# Patient Record
Sex: Male | Born: 1975 | Race: Black or African American | Hispanic: No | Marital: Single | Smoking: Never smoker
Health system: Southern US, Community
[De-identification: ages and names within clinical notes are randomized; demographics above are authoritative.]

## PROBLEM LIST (undated history)

## (undated) DIAGNOSIS — N189 Chronic kidney disease, unspecified: Secondary | ICD-10-CM

## (undated) DIAGNOSIS — M86172 Other acute osteomyelitis, left ankle and foot: Secondary | ICD-10-CM

## (undated) DIAGNOSIS — L089 Local infection of the skin and subcutaneous tissue, unspecified: Secondary | ICD-10-CM

## (undated) DIAGNOSIS — K219 Gastro-esophageal reflux disease without esophagitis: Secondary | ICD-10-CM

## (undated) DIAGNOSIS — E6609 Other obesity due to excess calories: Secondary | ICD-10-CM

## (undated) DIAGNOSIS — I509 Heart failure, unspecified: Secondary | ICD-10-CM

## (undated) DIAGNOSIS — I10 Essential (primary) hypertension: Secondary | ICD-10-CM

## (undated) HISTORY — DX: Other obesity due to excess calories: E66.09

## (undated) HISTORY — DX: Other acute osteomyelitis, left ankle and foot: M86.172

## (undated) HISTORY — DX: Local infection of the skin and subcutaneous tissue, unspecified: L08.9

## (undated) SURGERY — LEFT AND RIGHT HEART CATHETERIZATION WITH CORONARY ANGIOGRAM
Anesthesia: Moderate Sedation | Laterality: Right

---
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
## 1999-09-16 ENCOUNTER — Emergency Department (HOSPITAL_COMMUNITY): Admission: EM | Admit: 1999-09-16 | Discharge: 1999-09-16 | Payer: Self-pay | Admitting: Emergency Medicine

## 2000-09-12 ENCOUNTER — Encounter: Payer: Self-pay | Admitting: Emergency Medicine

## 2000-09-12 ENCOUNTER — Emergency Department (HOSPITAL_COMMUNITY): Admission: EM | Admit: 2000-09-12 | Discharge: 2000-09-12 | Payer: Self-pay | Admitting: Emergency Medicine

## 2000-10-12 ENCOUNTER — Emergency Department (HOSPITAL_COMMUNITY): Admission: EM | Admit: 2000-10-12 | Discharge: 2000-10-12 | Payer: Self-pay | Admitting: Emergency Medicine

## 2001-06-20 HISTORY — PX: ANKLE SURGERY: SHX546

## 2003-01-02 ENCOUNTER — Emergency Department (HOSPITAL_COMMUNITY): Admission: EM | Admit: 2003-01-02 | Discharge: 2003-01-02 | Payer: Self-pay | Admitting: Emergency Medicine

## 2003-09-28 ENCOUNTER — Emergency Department (HOSPITAL_COMMUNITY): Admission: EM | Admit: 2003-09-28 | Discharge: 2003-09-28 | Payer: Self-pay

## 2003-10-01 ENCOUNTER — Ambulatory Visit (HOSPITAL_BASED_OUTPATIENT_CLINIC_OR_DEPARTMENT_OTHER): Admission: RE | Admit: 2003-10-01 | Discharge: 2003-10-01 | Payer: Self-pay | Admitting: Orthopedic Surgery

## 2003-10-01 ENCOUNTER — Ambulatory Visit: Admission: RE | Admit: 2003-10-01 | Discharge: 2003-10-01 | Payer: Self-pay | Admitting: Orthopedic Surgery

## 2004-08-21 ENCOUNTER — Emergency Department (HOSPITAL_COMMUNITY): Admission: EM | Admit: 2004-08-21 | Discharge: 2004-08-21 | Payer: Self-pay | Admitting: Emergency Medicine

## 2006-11-17 ENCOUNTER — Emergency Department (HOSPITAL_COMMUNITY): Admission: EM | Admit: 2006-11-17 | Discharge: 2006-11-17 | Payer: Self-pay | Admitting: Family Medicine

## 2008-11-10 ENCOUNTER — Observation Stay (HOSPITAL_COMMUNITY): Admission: EM | Admit: 2008-11-10 | Discharge: 2008-11-11 | Payer: Self-pay | Admitting: Cardiovascular Disease

## 2008-11-10 ENCOUNTER — Ambulatory Visit: Payer: Self-pay | Admitting: Diagnostic Radiology

## 2008-11-10 ENCOUNTER — Encounter: Payer: Self-pay | Admitting: Emergency Medicine

## 2008-11-10 ENCOUNTER — Encounter (INDEPENDENT_AMBULATORY_CARE_PROVIDER_SITE_OTHER): Payer: Self-pay | Admitting: Cardiovascular Disease

## 2009-01-15 ENCOUNTER — Encounter: Payer: Self-pay | Admitting: Endocrinology

## 2009-01-26 ENCOUNTER — Ambulatory Visit: Payer: Self-pay | Admitting: Endocrinology

## 2009-01-26 DIAGNOSIS — I110 Hypertensive heart disease with heart failure: Secondary | ICD-10-CM | POA: Insufficient documentation

## 2009-09-02 ENCOUNTER — Ambulatory Visit: Payer: Self-pay | Admitting: Diagnostic Radiology

## 2009-09-02 ENCOUNTER — Inpatient Hospital Stay (HOSPITAL_COMMUNITY): Admission: AD | Admit: 2009-09-02 | Discharge: 2009-09-04 | Payer: Self-pay | Admitting: Internal Medicine

## 2009-09-02 ENCOUNTER — Encounter: Payer: Self-pay | Admitting: Emergency Medicine

## 2009-09-07 ENCOUNTER — Telehealth: Payer: Self-pay | Admitting: Endocrinology

## 2009-09-17 ENCOUNTER — Ambulatory Visit: Payer: Self-pay | Admitting: Endocrinology

## 2009-09-17 DIAGNOSIS — N498 Inflammatory disorders of other specified male genital organs: Secondary | ICD-10-CM | POA: Insufficient documentation

## 2009-09-25 ENCOUNTER — Ambulatory Visit: Payer: Self-pay | Admitting: Endocrinology

## 2009-09-25 DIAGNOSIS — B356 Tinea cruris: Secondary | ICD-10-CM

## 2009-09-25 DIAGNOSIS — J309 Allergic rhinitis, unspecified: Secondary | ICD-10-CM | POA: Insufficient documentation

## 2009-10-27 ENCOUNTER — Encounter (INDEPENDENT_AMBULATORY_CARE_PROVIDER_SITE_OTHER): Payer: Self-pay | Admitting: *Deleted

## 2009-10-27 ENCOUNTER — Encounter: Payer: Self-pay | Admitting: Endocrinology

## 2009-10-27 LAB — CONVERTED CEMR LAB
ALT: 18 units/L
AST: 15 units/L
BUN: 20 mg/dL
Basophils Relative: 0 %
CO2: 25 meq/L
Calcium: 9.3 mg/dL
Chloride: 101 meq/L
Creatinine, Ser: 1.1 mg/dL
Eosinophils Relative: 0 %
GFR calc non Af Amer: 83 mL/min
Glucose, Bld: 351 mg/dL
HCT: 45.2 %
Hemoglobin: 14.8 g/dL
Lymphocytes, automated: 1.5 %
MCV: 88 fL
Monocytes Relative: 0.7 %
Neutrophils Relative %: 8.5 %
Platelets: 287 10*3/uL
Potassium: 5.1 meq/L
RBC: 5.12 M/uL
RDW: 13.7 %
Sodium: 135 meq/L
Total Bilirubin: 0.7 mg/dL
WBC: 10.7 10*3/uL

## 2009-11-03 ENCOUNTER — Encounter (INDEPENDENT_AMBULATORY_CARE_PROVIDER_SITE_OTHER): Payer: Self-pay | Admitting: *Deleted

## 2009-11-05 ENCOUNTER — Telehealth (INDEPENDENT_AMBULATORY_CARE_PROVIDER_SITE_OTHER): Payer: Self-pay | Admitting: *Deleted

## 2010-05-20 ENCOUNTER — Encounter: Payer: Self-pay | Admitting: Endocrinology

## 2010-07-20 NOTE — Assessment & Plan Note (Signed)
Summary: 1 WK ROV/NWS   Vital Signs:  Patient profile:   35 year old male Height:      71 inches (180.34 cm) Weight:      341.38 pounds (155.17 kg) O2 Sat:      98 % on Room air Temp:     98.1 degrees F (36.72 degrees C) oral Pulse rate:   118 / minute BP sitting:   140 / 106  (left arm) Cuff size:   large  Vitals Entered By: Gardenia Phlegm RMA (September 25, 2009 4:24 PM)  O2 Flow:  Room air CC: 1 week follow up/ CF Is Patient Diabetic? Yes   Referring Provider:  Dr Robyn Haber  CC:  1 week follow up/ CF.  History of Present Illness: pt says his scrotal abscess is much better. however, he has few weeks of slight itching of the scrotum, with slight associated rash. no cbg record, but states cbg's are improved to low-100's. pt also says he has rhinorrhea and nasal congestion.  Current Medications (verified): 1)  Accu-Chek Compact  Strp (Glucose Blood) .... Two Times A Day, and Lancets 250.00 2)  Actos 45 Mg Tabs (Pioglitazone Hcl) .Marland Kitchen.. 1 Qd 3)  Januvia 100 Mg Tabs (Sitagliptin Phosphate) .Marland Kitchen.. 1 Qam 4)  Metformin Hcl 500 Mg Xr24h-Tab (Metformin Hcl) .Marland Kitchen.. 1 Tab By Mouth Two Times A Day 5)  Carvedilol 3.125 Mg Tabs (Carvedilol) .Marland Kitchen.. 1 Tab By Mouth Two Times A Day 6)  Furosemide 40 Mg Tabs (Furosemide) .Marland Kitchen.. 1 Tab By Mouth Daily  Allergies (verified): No Known Drug Allergies  Past History:  Past Medical History: SCROTAL ABSCESS (ICD-608.4) HYPERTENSION (ICD-401.9) DM (ICD-250.00)  Review of Systems  The patient denies fever and dyspnea on exertion.         denies hypoglycemia  Physical Exam  General:  morbidly obese.  no distress  Head:  head: no deformity eyes: no periorbital swelling, no proptosis external nose and ears are normal mouth: no lesion seen Ears:  TM's intact and clear with normal canals with grossly normal hearing.   Genitalia:  the incision is 90% closed.  no drainage there is an excematous, slightly red rash on the scrotum   Impression &  Recommendations:  Problem # 1:  SCROTAL ABSCESS (ICD-608.4) Assessment Improved  Problem # 2:  HYPERTENSION (ICD-401.9) needs increased rx  Problem # 3:  DM (ICD-250.00) Assessment: Improved  Problem # 4:  TINEA CRURIS (ICD-110.3) Assessment: New  Problem # 5:  ALLERGIC RHINITIS CAUSE UNSPECIFIED (ICD-477.9) Assessment: New  Medications Added to Medication List This Visit: 1)  Clotrimazole-betamethasone 1-0.05 % Crea (Clotrimazole-betamethasone) .... Three times a day as needed for itching 2)  Carvedilol 12.5 Mg Tabs (Carvedilol) .Marland Kitchen.. 1 two times a day  Other Orders: Est. Patient Level IV [id number])  Patient Instructions: 1)  check your blood sugar 1 time a day.  vary the time of day when you check, between before the 3 meals, and at bedtime.  also check if you have symptoms of your blood sugar being too high or too low.  please keep a record of the readings and bring it to your next appointment here.  please call us sooner if you are having low blood sugar episodes. 2)  same medications for diabetes 3)  we will need to take this complex situation in stages. 4)  for now, increase the carvedilol to 12.5 mg two times a day. 5)  lotrisone cream three times a day as needed for itching. 6)  loratadine-d (non-prescription) as needed for congestion 7)  here are some samples of nasonex.  take 2 sprays each side two times a day.  if you want a prescription, i would advise a similar generic. 8)  Please schedule a follow-up appointment in 3 weeks. Prescriptions: FUROSEMIDE 40 MG TABS (FUROSEMIDE) 1 tab by mouth daily  #30 x 11   Entered and Authorized by:   Donavan Foil MD   Signed by:   Donavan Foil MD on 09/25/2009   Method used:   Electronically to        [facility]. [address] [phone]* (retail)       [address] [place], [place]  [address]       Ph: [phone] or [phone]       Fax: [phone]   RxID:   [id number] CARVEDILOL 12.5 MG TABS  (CARVEDILOL) 1 two times a day  #60 x 11   Entered and Authorized by:   Donavan Foil MD   Signed by:   Donavan Foil MD on 09/25/2009   Method used:   Electronically to        [facility]. [address] [phone]* (retail)       [address] [place], [place]  [address]       Ph: [phone] or [phone]       Fax: [phone]   RxID:   [id number] CLOTRIMAZOLE-BETAMETHASONE 1-0.05 % CREA (CLOTRIMAZOLE-BETAMETHASONE) three times a day as needed for itching  #1 med tube x 2   Entered and Authorized by:   Donavan Foil MD   Signed by:   Donavan Foil MD on 09/25/2009   Method used:   Electronically to        [facility]. [address] [phone]* (retail)       [address]       [place], [place]  [address]       Ph: [phone] or [phone]       Fax: [phone]   RxID:   [id number]

## 2010-07-20 NOTE — Progress Notes (Signed)
Summary: Glucometer  Phone Note From Other Clinic   Caller: Diane--Adv. [facility] [id number] Call For: [phone] Summary of Call: Diane called requesting glucometer. Per Diane, he does not have a glucometer in the home. Please advise. Initial call taken by: Ernestene Mention,  September 07, 2009 10:48 AM  Follow-up for Phone Call        options: i can send rx to pharmacy f/u ov is due, when i can give you one. Follow-up by: Donavan Foil MD,  September 07, 2009 12:08 PM  Additional Follow-up for Phone Call Additional follow up Details #1::        left message on machine for patient to call back to office. Additional Follow-up by: Ernestene Mention,  September 07, 2009 12:35 PM    Additional Follow-up for Phone Call Additional follow up Details #2::    pt has f/u appt sch and will get glucometer at [facility] Follow-up by: Crissie Sickles, [place],  September 08, 2009 2:16 PM

## 2010-07-20 NOTE — Letter (Signed)
Summary: [facility]   Imported By: Bubba Hales 11/04/2009 08:53:33  _____________________________________________________________________  External Attachment:    Type:   Image     Comment:   External Document

## 2010-07-20 NOTE — Assessment & Plan Note (Signed)
Summary: PER PTD/T--DIABETES FU--HAD BOIL/LEG SURGERY:09/04/09---STC   Vital Signs:  Patient profile:   35 year old male Height:      71 inches (180.34 cm) Weight:      338.25 pounds (153.75 kg) O2 Sat:      94 % on Room air Temp:     99.0 degrees F (37.22 degrees C) oral Pulse rate:   135 / minute BP sitting:   152 / 120  (left arm) Cuff size:   large  Vitals Entered By: Gardenia Phlegm RMA (September 17, 2009 10:24 AM)  O2 Flow:  Room air CC: Diabetic follow up/ pt states he had surgery on his leg, hips, and on a boil on his groin/ CF Is Patient Diabetic? Yes   Referring Provider:  Dr Robyn Haber  CC:  Diabetic follow up/ pt states he had surgery on his leg, hips, and and on a boil on his groin/ CF.  History of Present Illness: pt is long overdue for f/u.  he was seen in er for few days of moderate abscess at the left inguinal area, but no associated fever.   he was rx'ed insulin in the er.  he did not take oral agents as rx'ed, and does not check cbg's.  Current Medications (verified): 1)  Accu-Chek Compact  Strp (Glucose Blood) .... Two Times A Day, and Lancets 250.00 2)  Actos 45 Mg Tabs (Pioglitazone Hcl) .Marland Kitchen.. 1 Qd 3)  Januvia 100 Mg Tabs (Sitagliptin Phosphate) .Marland Kitchen.. 1 Qam 4)  Metformin Hcl 500 Mg Xr24h-Tab (Metformin Hcl) .Marland Kitchen.. 1 Tab By Mouth Two Times A Day 5)  Carvedilol 3.125 Mg Tabs (Carvedilol) .Marland Kitchen.. 1 Tab By Mouth Two Times A Day 6)  Doxycycline Hyclate 100mg  .... 1 Tab By Mouth Two Times A Day 7)  Benazepril 5mg  .... 1 Tab By Mouth Two Times A Day 8)  Furosemide 40 Mg Tabs (Furosemide) .Marland Kitchen.. 1 Tab By Mouth Daily  Allergies (verified): No Known Drug Allergies  Past History:  Past Medical History: HYPERTENSION (ICD-401.9) DM (ICD-250.00)  Review of Systems  The patient denies hypoglycemia and fever.    Physical Exam  General:  morbidly obese.   Genitalia:  there is an open but clean incision at the left scrotum, approx 10 cm long.  no  erythema   Impression & Recommendations:  Problem # 1:  DM (ICD-250.00) therapy limited by noncompliance.  i'll do the best i can.  Problem # 2:  SCROTAL ABSCESS (ICD-608.4) Assessment: Improved  Problem # 3:  HYPERTENSION (ICD-401.9) therapy limited by noncompliance.  i'll do the best i can.  Medications Added to Medication List This Visit: 1)  Metformin Hcl 500 Mg Xr24h-tab (Metformin hcl) .Marland Kitchen.. 1 tab by mouth two times a day 2)  Carvedilol 3.125 Mg Tabs (Carvedilol) .Marland Kitchen.. 1 tab by mouth two times a day 3)  Doxycycline Hyclate 100mg   .... 1 tab by mouth two times a day 4)  Benazepril 5mg   .... 1 tab by mouth two times a day 5)  Furosemide 40 Mg Tabs (Furosemide) .Marland Kitchen.. 1 tab by mouth daily  Other Orders: Est. Patient Level IV [id number])  Patient Instructions: 1)  check your blood sugar 1 time a day.  vary the time of day when you check, between before the 3 meals, and at bedtime.  also check if you have symptoms of your blood sugar being too high or too low.  please keep a record of the readings and bring it to your next appointment here.  please call us sooner if you are having low blood sugar episodes. 2)  here are 2 identical blood sugar meters. 3)  resume these pills: 4)  actos 45 mg once daily 5)  metformin-xr 2000 mg each am 6)  januvia 100 mg each am 7)  (these will replace your insulin.  i have sent presctriptions to your pharmacy) 8)  return 1 week 9)  for now, resume just the carvedilol and furosemide as listed below 10)  you can stop the antiboitic 11)  wash the area gently with soap and water two times a day, and dry with a hair dryer. 12)  keep a dry paper towel against the area. Prescriptions: METFORMIN HCL 500 MG XR24H-TAB (METFORMIN HCL) 4 tabs qam  #120 x 11   Entered and Authorized by:   Donavan Foil MD   Signed by:   Donavan Foil MD on 09/17/2009   Method used:   Electronically to        [facility]. [address] [phone]* (retail)       [address] [place], [place]  [address]       Ph: [phone] or [phone]       Fax: [phone]   RxID:   [id number] JANUVIA 100 MG TABS (SITAGLIPTIN PHOSPHATE) 1 qam  #30 x 11   Entered and Authorized by:   Donavan Foil MD   Signed by:   Donavan Foil MD on 09/17/2009   Method used:   Electronically to        [facility]. [address] [phone]* (retail)       [address] [place], [place]  [address]       Ph: [phone] or [phone]       Fax: [phone]   RxID:   [id number] ACTOS 45 MG TABS (PIOGLITAZONE HCL) 1 qd  #30 x 11   Entered and Authorized by:   Donavan Foil MD   Signed by:   Donavan Foil MD on 09/17/2009   Method used:   Electronically to        [facility]. [address] [phone]* (retail)       [address] [place], [place]  [address]       Ph: [phone] or [phone]       Fax: [phone]   RxID:   [id number] ACCU-CHEK COMPACT  STRP (GLUCOSE BLOOD) two times a day, and lancets 250.00  #100 x 11   Entered and Authorized by:   Donavan Foil MD   Signed by:   Donavan Foil MD on 09/17/2009   Method used:   Electronically to        [facility]. [address] [phone]* (retail)       [address]       [place], [place]  [address]       Ph: [phone] or [phone]       Fax: [phone]   RxID:   [id number]

## 2010-07-20 NOTE — Progress Notes (Signed)
  Phone Note Outgoing Call   Summary of Call: Left message for pt to return my call. Initial call taken by: Gardenia Phlegm RMA,  Nov 05, 2009 4:51 PM

## 2010-07-20 NOTE — Miscellaneous (Signed)
Summary: Lab results  Clinical Lists Changes  Observations: Added new observation of BILI TOTAL: 0.7 mg/dL (10/27/2009 15:03) Added new observation of SGPT (ALT): 18 units/L (10/27/2009 15:03) Added new observation of SGOT (AST): 15 units/L (10/27/2009 15:03) Added new observation of GFR: 83 mL/min (10/27/2009 15:02) Added new observation of CALCIUM: 9.3 mg/dL (10/27/2009 15:02) Added new observation of CO2 PLSM/SER: 25 meq/L (10/27/2009 15:02) Added new observation of CL SERUM: 101 meq/L (10/27/2009 15:02) Added new observation of K SERUM: 5.1 meq/L (10/27/2009 15:02) Added new observation of NA: 135 meq/L (10/27/2009 15:02) Added new observation of CREATININE: 1.1 mg/dL (10/27/2009 15:02) Added new observation of BUN: 20 mg/dL (10/27/2009 15:02) Added new observation of BG RANDOM: 351 mg/dL (10/27/2009 15:02) Added new observation of BASOPHIL %: 0.0 % (10/27/2009 15:01) Added new observation of % EOS AUTO: 0.00 % (10/27/2009 15:01) Added new observation of MONOCYTE %: 0.7 % (10/27/2009 15:01) Added new observation of LYMPH %: 1.5 % (10/27/2009 15:01) Added new observation of PMN %: 8.5 % (10/27/2009 15:01) Added new observation of RDW: 13.7 % (10/27/2009 15:01) Added new observation of MCV: 88 fL (10/27/2009 15:01) Added new observation of PLATELETK/UL: 287 K/uL (10/27/2009 15:01) Added new observation of RBC M/UL: 5.12 M/uL (10/27/2009 15:01) Added new observation of HCT: 45.2 % (10/27/2009 15:01) Added new observation of HGB: 14.8 g/dL (10/27/2009 15:01) Added new observation of WBC COUNT: 10.7 10*3/microliter (10/27/2009 15:01)      -  Date:  10/27/2009    WBC: 10.7    HGB: 14.8    HCT: 45.2    RBC: 5.12    PLT: 287    MCV: 88    RDW: 13.7    Neutrophil: 8.5    Lymphs: 1.5    Monos: 0.7    Eos: 0.00    Basophil: 0.0    BG Random: 351    BUN: 20    Creatinine: 1.1    Sodium: 135    Potassium: 5.1    Chloride: 101    CO2 Total: 25    Calcium: 9.3    GFR(Non  African American): 83    AST: 15    ALT: 18    T Bili: 0.7

## 2010-07-22 NOTE — Letter (Signed)
Summary: [facility]   Imported By: Phillis Knack 06/02/2010 10:41:07  _____________________________________________________________________  External Attachment:    Type:   Image     Comment:   External Document

## 2010-09-13 LAB — ANAEROBIC CULTURE

## 2010-09-13 LAB — COMPREHENSIVE METABOLIC PANEL
ALT: 18 U/L (ref 0–53)
Alkaline Phosphatase: 63 U/L (ref 39–117)
Chloride: 101 mEq/L (ref 96–112)
Glucose, Bld: 261 mg/dL — ABNORMAL HIGH (ref 70–99)
Potassium: 4.1 mEq/L (ref 3.5–5.1)
Sodium: 136 mEq/L (ref 135–145)
Total Protein: 6.5 g/dL (ref 6.0–8.3)

## 2010-09-13 LAB — CULTURE, BLOOD (ROUTINE X 2): Culture: NO GROWTH

## 2010-09-13 LAB — BASIC METABOLIC PANEL
BUN: 16 mg/dL (ref 6–23)
BUN: 12 mg/dL (ref 6–23)
CO2: 27 mEq/L (ref 19–32)
CO2: 26 mEq/L (ref 19–32)
Calcium: 8.3 mg/dL — ABNORMAL LOW (ref 8.4–10.5)
Chloride: 100 mEq/L (ref 96–112)
Chloride: 102 mEq/L (ref 96–112)
Creatinine, Ser: 1 mg/dL (ref 0.4–1.5)
Creatinine, Ser: 1 mg/dL (ref 0.4–1.5)
GFR calc Af Amer: >60 mL/min (ref >60)

## 2010-09-13 LAB — URINALYSIS, ROUTINE W REFLEX MICROSCOPIC
Bilirubin Urine: NEGATIVE
Specific Gravity, Urine: 1.046 — ABNORMAL HIGH (ref 1.005–1.030)
pH: 5.5 (ref 5.0–8.0)

## 2010-09-13 LAB — CBC
Hemoglobin: 14.1 g/dL (ref 13.0–17.0)
MCHC: 33.9 g/dL (ref 30.0–36.0)
MCHC: 33 g/dL (ref 30.0–36.0)
MCV: 89.8 fL (ref 78.0–100.0)
MCV: 91 fL (ref 78.0–100.0)
Platelets: 308 10*3/uL (ref 150–400)
Platelets: 292 10*3/uL (ref 150–400)
RBC: 5.35 MIL/uL (ref 4.22–5.81)
RBC: 4.6 MIL/uL (ref 4.22–5.81)
RDW: 13.2 % (ref 11.5–15.5)
WBC: 12.8 10*3/uL — ABNORMAL HIGH (ref 4.0–10.5)

## 2010-09-13 LAB — GLUCOSE, CAPILLARY
Glucose-Capillary: 332 mg/dL — ABNORMAL HIGH (ref 70–99)
Glucose-Capillary: 476 mg/dL — ABNORMAL HIGH (ref 70–99)
Glucose-Capillary: 265 mg/dL — ABNORMAL HIGH (ref 70–99)
Glucose-Capillary: 266 mg/dL — ABNORMAL HIGH (ref 70–99)
Glucose-Capillary: 278 mg/dL — ABNORMAL HIGH (ref 70–99)
Glucose-Capillary: 282 mg/dL — ABNORMAL HIGH (ref 70–99)
Glucose-Capillary: 328 mg/dL — ABNORMAL HIGH (ref 70–99)
Glucose-Capillary: 333 mg/dL — ABNORMAL HIGH (ref 70–99)
Glucose-Capillary: 346 mg/dL — ABNORMAL HIGH (ref 70–99)

## 2010-09-13 LAB — DIFFERENTIAL
Basophils Relative: 4 % — ABNORMAL HIGH (ref 0–1)
Basophils Relative: 0 % (ref 0–1)
Eosinophils Absolute: 0 10*3/uL (ref 0.0–0.7)
Eosinophils Absolute: 0 10*3/uL (ref 0.0–0.7)
Lymphocytes Relative: 12 % (ref 12–46)
Monocytes Absolute: 1.6 10*3/uL — ABNORMAL HIGH (ref 0.1–1.0)
Monocytes Absolute: 1.1 10*3/uL — ABNORMAL HIGH (ref 0.1–1.0)
Monocytes Relative: 8 % (ref 3–12)
Neutrophils Relative %: 75 % (ref 43–77)
Neutrophils Relative %: 79 % — ABNORMAL HIGH (ref 43–77)

## 2010-09-13 LAB — POCT I-STAT 3, VENOUS BLOOD GAS (G3P V)
Bicarbonate: 26.4 mEq/L — ABNORMAL HIGH (ref 20.0–24.0)
pH, Ven: 7.393 — ABNORMAL HIGH (ref 7.250–7.300)
pO2, Ven: 33 mmHg (ref 30.0–45.0)

## 2010-09-13 LAB — CULTURE, ROUTINE-ABSCESS

## 2010-09-13 LAB — HEMOGLOBIN A1C: Mean Plasma Glucose: 341 mg/dL

## 2010-09-13 LAB — URINE MICROSCOPIC-ADD ON

## 2010-09-13 LAB — POCT CARDIAC MARKERS

## 2010-09-28 LAB — DIFFERENTIAL
Basophils Absolute: 0.5 10*3/uL — ABNORMAL HIGH (ref 0.0–0.1)
Basophils Relative: 1 % (ref 0–1)
Eosinophils Absolute: 0 10*3/uL (ref 0.0–0.7)
Eosinophils Relative: 1 % (ref 0–5)
Lymphocytes Relative: 24 % (ref 12–46)
Lymphs Abs: 1.5 10*3/uL (ref 0.7–4.0)
Monocytes Absolute: 0.7 10*3/uL (ref 0.1–1.0)
Monocytes Absolute: 1.2 10*3/uL — ABNORMAL HIGH (ref 0.1–1.0)
Monocytes Relative: 6 % (ref 3–12)
Monocytes Relative: 9 % (ref 3–12)

## 2010-09-28 LAB — BASIC METABOLIC PANEL
CO2: 26 mEq/L (ref 19–32)
CO2: 29 mEq/L (ref 19–32)
Calcium: 8.4 mg/dL (ref 8.4–10.5)
Chloride: 104 mEq/L (ref 96–112)
GFR calc Af Amer: >60 mL/min (ref >60)
GFR calc non Af Amer: >60 mL/min (ref >60)
Glucose, Bld: 185 mg/dL — ABNORMAL HIGH (ref 70–99)
Glucose, Bld: 351 mg/dL — ABNORMAL HIGH (ref 70–99)
Potassium: 3.8 mEq/L (ref 3.5–5.1)
Potassium: 3.9 mEq/L (ref 3.5–5.1)
Sodium: 140 mEq/L (ref 135–145)
Sodium: 141 mEq/L (ref 135–145)

## 2010-09-28 LAB — COMPREHENSIVE METABOLIC PANEL
ALT: 50 U/L (ref 0–53)
AST: 35 U/L (ref 0–37)
Albumin: 2.9 g/dL — ABNORMAL LOW (ref 3.5–5.2)
Alkaline Phosphatase: 61 U/L (ref 39–117)
Calcium: 8.7 mg/dL (ref 8.4–10.5)
GFR calc Af Amer: >60 mL/min (ref >60)
Potassium: 3.9 mEq/L (ref 3.5–5.1)
Sodium: 137 mEq/L (ref 135–145)
Total Protein: 6 g/dL (ref 6.0–8.3)

## 2010-09-28 LAB — CBC
HCT: 44.1 % (ref 39.0–52.0)
HCT: 49.6 % (ref 39.0–52.0)
Hemoglobin: 14.9 g/dL (ref 13.0–17.0)
Hemoglobin: 15.4 g/dL (ref 13.0–17.0)
Hemoglobin: 16.5 g/dL (ref 13.0–17.0)
MCHC: 33.8 g/dL (ref 30.0–36.0)
Platelets: 295 10*3/uL (ref 150–400)
RBC: 5.07 MIL/uL (ref 4.22–5.81)
RBC: 5.69 MIL/uL (ref 4.22–5.81)
RDW: 13.5 % (ref 11.5–15.5)
RDW: 13.9 % (ref 11.5–15.5)
RDW: 14.1 % (ref 11.5–15.5)

## 2010-09-28 LAB — CARDIAC PANEL(CRET KIN+CKTOT+MB+TROPI)
CK, MB: 1.2 ng/mL (ref 0.3–4.0)
Relative Index: INVALID (ref 0.0–2.5)
Relative Index: INVALID (ref 0.0–2.5)
Total CK: 92 U/L (ref 7–232)
Troponin I: 0.03 ng/mL (ref 0.00–0.06)

## 2010-09-28 LAB — URINE CULTURE

## 2010-09-28 LAB — GLUCOSE, CAPILLARY
Glucose-Capillary: 254 mg/dL — ABNORMAL HIGH (ref 70–99)
Glucose-Capillary: 275 mg/dL — ABNORMAL HIGH (ref 70–99)
Glucose-Capillary: 330 mg/dL — ABNORMAL HIGH (ref 70–99)
Glucose-Capillary: 368 mg/dL — ABNORMAL HIGH (ref 70–99)

## 2010-09-28 LAB — URINALYSIS, MICROSCOPIC ONLY
Bilirubin Urine: NEGATIVE
Glucose, UA: 250 mg/dL — AB
Hgb urine dipstick: NEGATIVE
Ketones, ur: NEGATIVE mg/dL
Specific Gravity, Urine: 1.022 (ref 1.005–1.030)
pH: 5.5 (ref 5.0–8.0)

## 2010-09-28 LAB — POCT CARDIAC MARKERS
CKMB, poc: <1 ng/mL — ABNORMAL LOW (ref 1.0–8.0)
Troponin i, poc: <0.05 ng/mL (ref 0.00–0.09)

## 2010-09-28 LAB — BRAIN NATRIURETIC PEPTIDE: Pro B Natriuretic peptide (BNP): 218 pg/mL — ABNORMAL HIGH (ref 0.0–100.0)

## 2010-09-28 LAB — APTT: aPTT: 31 seconds (ref 24–37)

## 2010-09-28 LAB — LIPID PANEL
LDL Cholesterol: 63 mg/dL (ref 0–99)
Total CHOL/HDL Ratio: 4.4 RATIO
Triglycerides: 106 mg/dL (ref <150)
VLDL: 21 mg/dL (ref 0–40)

## 2010-09-28 LAB — PROTIME-INR
INR: 1.1 (ref 0.00–1.49)
Prothrombin Time: 14.4 seconds (ref 11.6–15.2)

## 2010-09-28 LAB — HEMOGLOBIN A1C: Mean Plasma Glucose: 306 mg/dL

## 2010-11-02 NOTE — Discharge Summary (Signed)
NAME:  Joshua Massey, Joshua Massey              ACCOUNT NO.:  [id number]   MEDICAL RECORD NO.:  [id number]          PATIENT TYPE:  INP   LOCATION:  3736                         FACILITY:  [facility]   PHYSICIAN:  Richard A. Rollene Fare, M.D.DATE OF BIRTH:  [birth year]   DATE OF ADMISSION:  11/10/2008  DATE OF DISCHARGE:  11/11/2008                               DISCHARGE SUMMARY   DISCHARGE DIAGNOSES:  1. Congestive heart failure, improved at discharge.  2. Hypertension, uncontrolled.  3. Noncompliance with medications.  4. New diagnosis of diet-controlled diabetes.   HOSPITAL COURSE:  The patient is a 35 year old male, who was admitted  with increasing shortness of breath on Nov 10, 2008.  He has no primary  care doctor.  He has no prior history of coronary disease.  He does have  a history of hypertension, and apparently, he had been out of his  medicines for at least a month.  He was admitted with increasing  dyspnea.  BNP initially was 150 though his chest x-ray did show  congestive failure.  Fasting blood sugar was 368.  The patient was  admitted to telemetry for further evaluation.  He was placed on Coreg  and diuretics and enalapril.  Echocardiogram was obtained, the results  are pending at the time of this dictation.  He did improve with  diuresis.  We feel he can be discharged on Nov 11, 2008.  We will follow  up his echocardiogram as an outpatient.  He was seen in consult by the  dietician.  He has been instructed on a 2 g sodium ADA diet.   DISCHARGE MEDICATIONS:  1. Coated aspirin 325 mg a day.  2. Enalapril 5 mg b.i.d.  3. Coreg 3.125 mg twice a day.  4. Lasix 40 mg a day.  5. Potassium 20 mEq a day.   LABORATORY DATA AT DISCHARGE:  Urine culture is pending.  Urinalysis did  show some proteinuria.  Cholesterol was 109, HDL 25, LDL 63.  Troponins  were negative.  TSH is 1.598.  BNP peaked at 218.  Sodium 138, potassium  3.8, BUN 10, creatinine 0.9, glucose was 189-215.  White count  11.9,  hemoglobin 14.9, hematocrit 44, platelets 308.  D-dimer was 0.4, INR  1.1.   EKG shows sinus rhythm, poor anterior R-wave progression, and PVCs.   DISPOSITION:  The patient is discharged in a stable condition.  He will  follow up with Dr. Rollene Fare.  He has been instructed on a low-gram  sodium diabetic diet and encouraged to continue taking his medications  as instructed.  We will need to follow up his echo cardiogram results as  an outpatient.  He is somewhat obese, and at some point, we may want to  consider a sleep study if this has not been done already.  His admission  weight was 153 kg.      Erlene Quan, P.A.      Richard A. Rollene Fare, M.D.  Electronically Signed    LKK/MEDQ  D:  11/11/2008  T:  11/12/2008  Job:  [id number]

## 2010-11-05 NOTE — Op Note (Signed)
NAME:  WHITLEY, ZARLING                        ACCOUNT NO.:  [id number]   MEDICAL RECORD NO.:  [id number]                   PATIENT TYPE:  AMB   LOCATION:  [place]                                  FACILITY:  [facility]   PHYSICIAN:  Kathalene Frames. Mayer Camel, M.D.                DATE OF BIRTH:  28-Oct-1975   DATE OF PROCEDURE:  DATE OF DISCHARGE:  10/01/2003                                 OPERATIVE REPORT   PREOPERATIVE DIAGNOSIS:  Bimalleolar fracture of the right ankle.   POSTOPERATIVE DIAGNOSIS:  Bimalleolar fracture of the right ankle.   PROCEDURE:  Open reduction, internal fixation right ankle using a lateral  approach with a 6-hold one-third tubular side plate and 2 anterior-to-  posterior interfragmentary screws.  Once this was accomplished the  syndesmosis was stable and no syndesmosis screw was used.   SURGEON:  Kathalene Frames. Mayer Camel, M.D.   ASSISTANT:  Robley Fries, P.A.C.   ANESTHETIC:  General endotracheal.   ESTIMATED BLOOD LOSS:  Minimal.   FLUID REPLACEMENT:  One liter of crystalloids.   TOURNIQUET TIME:  About 45 minutes.   INDICATIONS FOR PROCEDURE:  A 35 year old gentleman who sustained a  bimalleolar right ankle fracture a few days before the April 13, yesterday.  He was seen in the office and was found to have an SE-4 type fracture with  the very tip of the medial malleolus off to signify the deltoid rupture, but  it was so small that it could not be fixed.  In any event he is taken for  open reduction and internal fixation to increase stability, decrease pain  and increase function.   DESCRIPTION OF PROCEDURE:  The patient was identified by arm band and taken  to the operating room at [facility].  Appropriate anesthetic  monitors were attached and general endotracheal anesthesia induced with the  patient in the supine position.  Tourniquet was applied to the right calf.  Bump placed underneath the right hip; and the right lower extremity prepped  and draped in the  usual sterile fashion from the toes to the tourniquet.  The limb was exsanguinated with an Esmarch bandage.  Tourniquet inflated to  300 mmHg and we began the procedure by making a lateral incision starting  out at the tip of the lateral malleolus and going proximal through the skin  and subcutaneous tissue being careful to avoid any significant branches of  the superficial perineal nerve anteriorly or the sural nerve posteriorly.  We dissected down to the bone of the distal fibula, reflected periosteum  anteriorly and posteriorly exposing the fracture which reduced nicely with a  Lion's jaw towel clamp.  Two anterior-to-posterior interfragmentary screws  were then placed getting an anatomic reduction followed by a 7-hole,  lateral, one-third tubular plate from the Depuy titanium small fragment set,  4 bicortical screws proximally and 3 cancellous screws distally; therefore,  it was 7-hole plate.  Once this fixation had been accomplished C-arm images  were taken confirming the anatomic reduction.  The hook test was  accomplished with the ankle in neutral dorsiflexion confirming a stable  mortise.  At this point the tourniquet was let down.  The wound was washed  out  with normal saline solution irrigation.  The subcutaneous tissue closed with  2-0 Vicryl suture running and the skin with running interlocking 3-0 nylon  suture.  A dressing of Xeroform 4 x4, dressing, Webril, Ace wrap, and a cam  walker applied.  The patient was then awakened and taken to the recovery  room without difficulty.                                               Kathalene Frames. Mayer Camel, M.D.    Alphonsus Sias  D:  10/27/2003  T:  10/27/2003  Job:  [id number]

## 2010-11-17 ENCOUNTER — Other Ambulatory Visit: Payer: Self-pay | Admitting: Endocrinology

## 2011-04-28 ENCOUNTER — Other Ambulatory Visit: Payer: Self-pay | Admitting: Endocrinology

## 2011-07-05 ENCOUNTER — Other Ambulatory Visit: Payer: Self-pay | Admitting: Endocrinology

## 2011-12-13 ENCOUNTER — Other Ambulatory Visit: Payer: Self-pay | Admitting: Endocrinology

## 2012-02-14 ENCOUNTER — Emergency Department (HOSPITAL_COMMUNITY): Payer: BC Managed Care – PPO

## 2012-02-14 ENCOUNTER — Encounter (HOSPITAL_COMMUNITY): Payer: Self-pay | Admitting: *Deleted

## 2012-02-14 DIAGNOSIS — Z79899 Other long term (current) drug therapy: Secondary | ICD-10-CM

## 2012-02-14 DIAGNOSIS — I5023 Acute on chronic systolic (congestive) heart failure: Principal | ICD-10-CM | POA: Diagnosis present

## 2012-02-14 DIAGNOSIS — I472 Ventricular tachycardia, unspecified: Secondary | ICD-10-CM | POA: Diagnosis present

## 2012-02-14 DIAGNOSIS — Z91199 Patient's noncompliance with other medical treatment and regimen due to unspecified reason: Secondary | ICD-10-CM

## 2012-02-14 DIAGNOSIS — I428 Other cardiomyopathies: Secondary | ICD-10-CM | POA: Diagnosis present

## 2012-02-14 DIAGNOSIS — Z8249 Family history of ischemic heart disease and other diseases of the circulatory system: Secondary | ICD-10-CM

## 2012-02-14 DIAGNOSIS — I2510 Atherosclerotic heart disease of native coronary artery without angina pectoris: Secondary | ICD-10-CM | POA: Diagnosis present

## 2012-02-14 DIAGNOSIS — I4729 Other ventricular tachycardia: Secondary | ICD-10-CM | POA: Diagnosis present

## 2012-02-14 DIAGNOSIS — I10 Essential (primary) hypertension: Secondary | ICD-10-CM | POA: Diagnosis present

## 2012-02-14 DIAGNOSIS — I509 Heart failure, unspecified: Secondary | ICD-10-CM | POA: Diagnosis present

## 2012-02-14 DIAGNOSIS — Z6841 Body Mass Index (BMI) 40.0 and over, adult: Secondary | ICD-10-CM

## 2012-02-14 DIAGNOSIS — IMO0001 Reserved for inherently not codable concepts without codable children: Secondary | ICD-10-CM | POA: Diagnosis present

## 2012-02-14 DIAGNOSIS — Z9119 Patient's noncompliance with other medical treatment and regimen: Secondary | ICD-10-CM

## 2012-02-14 DIAGNOSIS — Z7982 Long term (current) use of aspirin: Secondary | ICD-10-CM

## 2012-02-14 DIAGNOSIS — R570 Cardiogenic shock: Secondary | ICD-10-CM | POA: Diagnosis present

## 2012-02-14 LAB — COMPREHENSIVE METABOLIC PANEL
ALT: 28 U/L (ref 0–53)
Alkaline Phosphatase: 130 U/L — ABNORMAL HIGH (ref 39–117)
BUN: 14 mg/dL (ref 6–23)
CO2: 30 mEq/L (ref 19–32)
GFR calc Af Amer: >90 mL/min (ref >90)
GFR calc non Af Amer: >90 mL/min (ref >90)
Glucose, Bld: 313 mg/dL — ABNORMAL HIGH (ref 70–99)
Potassium: 3.8 mEq/L (ref 3.5–5.1)
Sodium: 138 mEq/L (ref 135–145)
Total Bilirubin: 1 mg/dL (ref 0.3–1.2)

## 2012-02-14 LAB — CBC WITH DIFFERENTIAL/PLATELET
Hemoglobin: 14.7 g/dL (ref 13.0–17.0)
Lymphocytes Relative: 19 % (ref 12–46)
Lymphs Abs: 1.6 10*3/uL (ref 0.7–4.0)
MCV: 87.7 fL (ref 78.0–100.0)
Monocytes Relative: 9 % (ref 3–12)
Neutrophils Relative %: 72 % (ref 43–77)
Platelets: 275 10*3/uL (ref 150–400)
RBC: 4.94 MIL/uL (ref 4.22–5.81)
WBC: 8.4 10*3/uL (ref 4.0–10.5)

## 2012-02-14 NOTE — ED Notes (Signed)
The pt is c/o fluid build up.  He has been out of his  Fluid pills for 2 months..  Sob whenever he walks with both legs tight and heavy

## 2012-02-14 NOTE — ED Notes (Signed)
The pt was on metformin but  The med made him sick and he stopped taking it.  He has dropped his bp med down to one  A day instead of 2  So he will not run out

## 2012-02-15 ENCOUNTER — Encounter (HOSPITAL_COMMUNITY): Payer: Self-pay | Admitting: Internal Medicine

## 2012-02-15 ENCOUNTER — Inpatient Hospital Stay (HOSPITAL_COMMUNITY)
Admission: EM | Admit: 2012-02-15 | Discharge: 2012-02-24 | DRG: 544 | Disposition: A | Discharge status: Home / Self Care | Payer: BC Managed Care – PPO | Attending: Internal Medicine | Admitting: Internal Medicine

## 2012-02-15 DIAGNOSIS — I10 Essential (primary) hypertension: Secondary | ICD-10-CM

## 2012-02-15 DIAGNOSIS — I428 Other cardiomyopathies: Secondary | ICD-10-CM | POA: Diagnosis present

## 2012-02-15 DIAGNOSIS — R570 Cardiogenic shock: Secondary | ICD-10-CM

## 2012-02-15 DIAGNOSIS — I5023 Acute on chronic systolic (congestive) heart failure: Principal | ICD-10-CM

## 2012-02-15 DIAGNOSIS — I4729 Other ventricular tachycardia: Secondary | ICD-10-CM | POA: Diagnosis not present

## 2012-02-15 DIAGNOSIS — I509 Heart failure, unspecified: Secondary | ICD-10-CM

## 2012-02-15 DIAGNOSIS — I429 Cardiomyopathy, unspecified: Secondary | ICD-10-CM

## 2012-02-15 DIAGNOSIS — Z8249 Family history of ischemic heart disease and other diseases of the circulatory system: Secondary | ICD-10-CM

## 2012-02-15 DIAGNOSIS — I472 Ventricular tachycardia: Secondary | ICD-10-CM | POA: Diagnosis not present

## 2012-02-15 DIAGNOSIS — E119 Type 2 diabetes mellitus without complications: Secondary | ICD-10-CM

## 2012-02-15 DIAGNOSIS — R601 Generalized edema: Secondary | ICD-10-CM

## 2012-02-15 DIAGNOSIS — I2510 Atherosclerotic heart disease of native coronary artery without angina pectoris: Secondary | ICD-10-CM | POA: Diagnosis present

## 2012-02-15 DIAGNOSIS — E669 Obesity, unspecified: Secondary | ICD-10-CM | POA: Diagnosis present

## 2012-02-15 HISTORY — DX: Essential (primary) hypertension: I10

## 2012-02-15 HISTORY — DX: Gastro-esophageal reflux disease without esophagitis: K21.9

## 2012-02-15 HISTORY — DX: Heart failure, unspecified: I50.9

## 2012-02-15 LAB — COMPREHENSIVE METABOLIC PANEL
Albumin: 2.1 g/dL — ABNORMAL LOW (ref 3.5–5.2)
BUN: 14 mg/dL (ref 6–23)
Calcium: 8.5 mg/dL (ref 8.4–10.5)
Creatinine, Ser: 0.92 mg/dL (ref 0.50–1.35)
GFR calc Af Amer: >90 mL/min (ref >90)
Glucose, Bld: 136 mg/dL — ABNORMAL HIGH (ref 70–99)
Potassium: 3.5 mEq/L (ref 3.5–5.1)
Total Protein: 5.6 g/dL — ABNORMAL LOW (ref 6.0–8.3)

## 2012-02-15 LAB — PROTIME-INR
INR: 1.17 (ref 0.00–1.49)
Prothrombin Time: 15.1 seconds (ref 11.6–15.2)

## 2012-02-15 LAB — CBC WITH DIFFERENTIAL/PLATELET
Basophils Relative: 0 % (ref 0–1)
HCT: 40.4 % (ref 39.0–52.0)
Hemoglobin: 13.5 g/dL (ref 13.0–17.0)
Lymphocytes Relative: 26 % (ref 12–46)
Lymphs Abs: 1.7 10*3/uL (ref 0.7–4.0)
MCHC: 33.4 g/dL (ref 30.0–36.0)
Monocytes Absolute: 0.6 10*3/uL (ref 0.1–1.0)
Monocytes Relative: 9 % (ref 3–12)
Neutro Abs: 4.2 10*3/uL (ref 1.7–7.7)
Neutrophils Relative %: 63 % (ref 43–77)
RBC: 4.58 MIL/uL (ref 4.22–5.81)
WBC: 6.5 10*3/uL (ref 4.0–10.5)

## 2012-02-15 LAB — CBC
HCT: 41.3 % (ref 39.0–52.0)
RDW: 15.3 % (ref 11.5–15.5)
WBC: 7.5 10*3/uL (ref 4.0–10.5)

## 2012-02-15 LAB — BASIC METABOLIC PANEL
CO2: 31 mEq/L (ref 19–32)
GFR calc non Af Amer: 88 mL/min — ABNORMAL LOW (ref >90)
Glucose, Bld: 253 mg/dL — ABNORMAL HIGH (ref 70–99)
Potassium: 3.9 mEq/L (ref 3.5–5.1)
Sodium: 138 mEq/L (ref 135–145)

## 2012-02-15 LAB — GLUCOSE, CAPILLARY
Glucose-Capillary: 143 mg/dL — ABNORMAL HIGH (ref 70–99)
Glucose-Capillary: 227 mg/dL — ABNORMAL HIGH (ref 70–99)
Glucose-Capillary: 267 mg/dL — ABNORMAL HIGH (ref 70–99)

## 2012-02-15 LAB — TSH: TSH: 1.777 u[IU]/mL (ref 0.350–4.500)

## 2012-02-15 LAB — MAGNESIUM: Magnesium: 1.7 mg/dL (ref 1.5–2.5)

## 2012-02-15 LAB — CARDIAC PANEL(CRET KIN+CKTOT+MB+TROPI)
CK, MB: 1.8 ng/mL (ref 0.3–4.0)
Total CK: 98 U/L (ref 7–232)
Troponin I: <0.3 ng/mL (ref <0.30)

## 2012-02-15 LAB — HEMOGLOBIN A1C: Mean Plasma Glucose: 364 mg/dL — ABNORMAL HIGH (ref <117)

## 2012-02-15 MED ORDER — CARVEDILOL 12.5 MG PO TABS
12.5000 mg | ORAL_TABLET | Freq: Two times a day (BID) | ORAL | Status: DC
  Administered 2012-02-15 – 2012-02-16 (×3): 12.5 mg via ORAL
  Filled 2012-02-15 (×6): qty 1

## 2012-02-15 MED ORDER — ONDANSETRON HCL 4 MG/2ML IJ SOLN: 4.0000 mg | Freq: Four times a day (QID) | INTRAMUSCULAR | Status: DC | PRN

## 2012-02-15 MED ORDER — POTASSIUM CHLORIDE CRYS ER 20 MEQ PO TBCR
20.0000 meq | EXTENDED_RELEASE_TABLET | Freq: Every day | ORAL | Status: DC
  Administered 2012-02-15 – 2012-02-17 (×3): 20 meq via ORAL
  Filled 2012-02-15 (×3): qty 1

## 2012-02-15 MED ORDER — ASPIRIN 81 MG PO CHEW
324.0000 mg | CHEWABLE_TABLET | Freq: Once | ORAL | Status: AC
  Administered 2012-02-15: 324 mg via ORAL
  Filled 2012-02-15: qty 4

## 2012-02-15 MED ORDER — INSULIN GLARGINE 100 UNIT/ML ~~LOC~~ SOLN
5.0000 [IU] | Freq: Every day | SUBCUTANEOUS | Status: DC
  Administered 2012-02-15: 5 [IU] via SUBCUTANEOUS

## 2012-02-15 MED ORDER — FUROSEMIDE 10 MG/ML IJ SOLN
40.0000 mg | Freq: Once | INTRAMUSCULAR | Status: AC
  Administered 2012-02-15: 40 mg via INTRAVENOUS
  Filled 2012-02-15: qty 4

## 2012-02-15 MED ORDER — INSULIN ASPART 100 UNIT/ML ~~LOC~~ SOLN
0.0000 [IU] | Freq: Three times a day (TID) | SUBCUTANEOUS | Status: DC
  Administered 2012-02-15: 3 [IU] via SUBCUTANEOUS
  Administered 2012-02-15 – 2012-02-16 (×3): 2 [IU] via SUBCUTANEOUS
  Administered 2012-02-16 (×2): 3 [IU] via SUBCUTANEOUS
  Administered 2012-02-17 (×2): 2 [IU] via SUBCUTANEOUS
  Administered 2012-02-17: 3 [IU] via SUBCUTANEOUS
  Administered 2012-02-18 – 2012-02-19 (×4): 1 [IU] via SUBCUTANEOUS
  Administered 2012-02-19 (×2): 2 [IU] via SUBCUTANEOUS
  Administered 2012-02-20: 1 [IU] via SUBCUTANEOUS
  Administered 2012-02-20 (×2): 2 [IU] via SUBCUTANEOUS
  Administered 2012-02-21 (×3): 1 [IU] via SUBCUTANEOUS
  Administered 2012-02-22: 2 [IU] via SUBCUTANEOUS
  Administered 2012-02-22: 1 [IU] via SUBCUTANEOUS
  Administered 2012-02-23: 3 [IU] via SUBCUTANEOUS
  Administered 2012-02-23 – 2012-02-24 (×3): 1 [IU] via SUBCUTANEOUS

## 2012-02-15 MED ORDER — SODIUM CHLORIDE 0.9 % IJ SOLN
3.0000 mL | Freq: Two times a day (BID) | INTRAMUSCULAR | Status: DC
  Administered 2012-02-15 – 2012-02-22 (×11): 3 mL via INTRAVENOUS

## 2012-02-15 MED ORDER — SODIUM CHLORIDE 0.9 % IJ SOLN: 3.0000 mL | INTRAMUSCULAR | Status: DC | PRN

## 2012-02-15 MED ORDER — IRBESARTAN 75 MG PO TABS
75.0000 mg | ORAL_TABLET | Freq: Every day | ORAL | Status: DC
  Administered 2012-02-15 – 2012-02-17 (×3): 75 mg via ORAL
  Filled 2012-02-15 (×3): qty 1

## 2012-02-15 MED ORDER — ONDANSETRON HCL 4 MG PO TABS: 4.0000 mg | ORAL_TABLET | Freq: Four times a day (QID) | ORAL | Status: DC | PRN

## 2012-02-15 MED ORDER — ASPIRIN EC 325 MG PO TBEC
325.0000 mg | DELAYED_RELEASE_TABLET | Freq: Every day | ORAL | Status: DC
  Administered 2012-02-15 – 2012-02-17 (×3): 325 mg via ORAL
  Filled 2012-02-15 (×3): qty 1

## 2012-02-15 MED ORDER — ACETAMINOPHEN 325 MG PO TABS
650.0000 mg | ORAL_TABLET | Freq: Four times a day (QID) | ORAL | Status: DC | PRN
  Administered 2012-02-19: 650 mg via ORAL
  Filled 2012-02-15 (×7): qty 2

## 2012-02-15 MED ORDER — ACETAMINOPHEN 650 MG RE SUPP: 650.0000 mg | Freq: Four times a day (QID) | RECTAL | Status: DC | PRN

## 2012-02-15 MED ORDER — SODIUM CHLORIDE 0.9 % IJ SOLN
3.0000 mL | Freq: Two times a day (BID) | INTRAMUSCULAR | Status: DC
  Administered 2012-02-16: 3 mL via INTRAVENOUS

## 2012-02-15 MED ORDER — FUROSEMIDE 10 MG/ML IJ SOLN
40.0000 mg | Freq: Four times a day (QID) | INTRAMUSCULAR | Status: DC
  Administered 2012-02-15 – 2012-02-16 (×7): 40 mg via INTRAVENOUS
  Filled 2012-02-15 (×11): qty 4

## 2012-02-15 MED ORDER — SODIUM CHLORIDE 0.9 % IJ SOLN
3.0000 mL | Freq: Two times a day (BID) | INTRAMUSCULAR | Status: DC
  Administered 2012-02-16 – 2012-02-17 (×2): 3 mL via INTRAVENOUS

## 2012-02-15 MED ORDER — SODIUM CHLORIDE 0.9 % IV SOLN: 250.0000 mL | INTRAVENOUS | Status: DC | PRN

## 2012-02-15 MED ORDER — ENOXAPARIN SODIUM 80 MG/0.8ML ~~LOC~~ SOLN
80.0000 mg | SUBCUTANEOUS | Status: DC
  Administered 2012-02-15 – 2012-02-20 (×6): 80 mg via SUBCUTANEOUS
  Filled 2012-02-15 (×7): qty 0.8

## 2012-02-15 MED ORDER — METFORMIN HCL 500 MG PO TABS
500.0000 mg | ORAL_TABLET | Freq: Two times a day (BID) | ORAL | Status: DC
  Administered 2012-02-15: 500 mg via ORAL
  Filled 2012-02-15 (×4): qty 1

## 2012-02-15 NOTE — Progress Notes (Signed)
  Echocardiogram 2D Echocardiogram has been performed.  Joshua Massey, [place] 02/15/2012, 2:25 PM

## 2012-02-15 NOTE — Consult Note (Addendum)
Pt. Seen and examined. Agree with the NP/PA-C note as written. 36 yo male with marked volume overload / anasarca and acute on chronic systolic congestive heart failure with NYHA Class IV symptoms.  I reviewed his echo today, EF is poor (10-15%, global hypokinesis, restrictive filling pattern, dilated ventricle) and markedly reduced from 2010.  I suspect he most likely has a non-ischemic cardiomyopathy, however, this must be definitively ruled-out, especially given his family history including his brother who had CABG at age 11 (his brother has ESRD on HD as well as being a Type 1 diabetic). Since we cannot find evidence for ischemic work-up in the past, I'm recommending a left and right heart catheterization. I discussed this with him today and he is agreeable. Will plan on cath tomorrow - he can lay flat, owing to the chronicity of his heart failure. Will likely consult the advanced HF service for further management and possible transplant work-up pending his catheterization.  We will be happy to assume care of the patient on our service if the hospitalists can follow along for management of blood glucose and other medical issues.  Pixie Casino, MD, [facility] Attending Cardiologist [facility]

## 2012-02-15 NOTE — ED Notes (Signed)
IV placed as ordered, pt tolerated well, placed on cardiac monitor, pt resting in no distress, VS stable.

## 2012-02-15 NOTE — ED Notes (Signed)
Pt resting in bed, no distress noted, remains on monitor, VS stable, awaiting admission bed.

## 2012-02-15 NOTE — Consult Note (Signed)
Reason for Consult: CHF exacerbation Referring Physician:   [name] is an 36 y.o. male.  HPI:   The patient is a 36 year old morbidly obese abdomen male with a history of hypertension diabetes mellitus. Significant family history for coronary artery disease with a brother who at age 41 had multivessel CABG. His brother and father are also on dialysis.  2-D echocardiogram in May 2010 showed an ejection fraction of 30-35% with no significant valve disease. He was last seen by Dr. Rollene Fare in December 2011.  He also been rehospitalized in March 2011 after running out of his medications and becoming hypertensive.  Patient was supposed to have a stress test back in 2011 but I do not see any record of it.  Patient presented yesterday with heart failure exacerbation. He ran out of his medications for a 2-3 months. He complains of lower extremity edema all the way up to his to his buttocks and lower back, DOE, occasional acid reflux. He denies orthopnea, nausea, vomiting, fever, congestion, chest pain, abdominal pain, sore throat, dysuria.    Past Medical History  Diagnosis Date  . Hypertension   . CHF (congestive heart failure)   . Diabetes mellitus   . GERD (gastroesophageal reflux disease)     Past Surgical History  Procedure Date  . Ankle surgery 2003    plate and screws  . Hip pinning 1990    bilateral    Family History  Problem Relation Age of Onset  . Coronary artery disease Brother     Social History:  reports that he has never smoked. He has never used smokeless tobacco. He reports that he does not drink alcohol or use illicit drugs.  Allergies: No Known Allergies  Medications:  Ran out of meds.  Results for orders placed during the hospital encounter of 02/15/12 (from the past 48 hour(s))  CBC WITH DIFFERENTIAL     Status: Normal   Collection Time   02/14/12  9:55 PM      Component Value Range Comment   WBC 8.4  4.0 - 10.5 K/uL    RBC 4.94  4.22 - 5.81 MIL/uL    Hemoglobin 14.7  13.0 - 17.0 g/dL    HCT 43.3  39.0 - 52.0 %    MCV 87.7  78.0 - 100.0 fL    MCH 29.8  26.0 - 34.0 pg    MCHC 33.9  30.0 - 36.0 g/dL    RDW 15.0  11.5 - 15.5 %    Platelets 275  150 - 400 K/uL    Neutrophils Relative 72  43 - 77 %    Neutro Abs 6.0  1.7 - 7.7 K/uL    Lymphocytes Relative 19  12 - 46 %    Lymphs Abs 1.6  0.7 - 4.0 K/uL    Monocytes Relative 9  3 - 12 %    Monocytes Absolute 0.7  0.1 - 1.0 K/uL    Eosinophils Relative 1  0 - 5 %    Eosinophils Absolute 0.1  0.0 - 0.7 K/uL    Basophils Relative 0  0 - 1 %    Basophils Absolute 0.0  0.0 - 0.1 K/uL   COMPREHENSIVE METABOLIC PANEL     Status: Abnormal   Collection Time   02/14/12  9:55 PM      Component Value Range Comment   Sodium 138  135 - 145 mEq/L    Potassium 3.8  3.5 - 5.1 mEq/L    Chloride 100  96 - 112 mEq/L    CO2 30  19 - 32 mEq/L    Glucose, Bld 313 (*) 70 - 99 mg/dL    BUN 14  6 - 23 mg/dL    Creatinine, Ser 0.90  0.50 - 1.35 mg/dL    Calcium 8.8  8.4 - 10.5 mg/dL    Total Protein 6.6  6.0 - 8.3 g/dL    Albumin 2.5 (*) 3.5 - 5.2 g/dL    AST 27  0 - 37 U/L    ALT 28  0 - 53 U/L    Alkaline Phosphatase 130 (*) 39 - 117 U/L    Total Bilirubin 1.0  0.3 - 1.2 mg/dL    GFR calc non Af Amer >90  >90 mL/min    GFR calc Af Amer >90  >90 mL/min   TROPONIN I     Status: Normal   Collection Time   02/14/12  9:55 PM      Component Value Range Comment   Troponin I <0.30  <0.30 ng/mL   PRO B NATRIURETIC PEPTIDE     Status: Abnormal   Collection Time   02/14/12  9:55 PM      Component Value Range Comment   Pro B Natriuretic peptide (BNP) 2637.0 (*) 0 - 125 pg/mL   GLUCOSE, CAPILLARY     Status: Abnormal   Collection Time   02/14/12 10:12 PM      Component Value Range Comment   Glucose-Capillary 317 (*) 70 - 99 mg/dL   GLUCOSE, CAPILLARY     Status: Abnormal   Collection Time   02/15/12  4:38 AM      Component Value Range Comment   Glucose-Capillary 143 (*) 70 - 99 mg/dL   GLUCOSE, CAPILLARY      Status: Abnormal   Collection Time   02/15/12  6:27 AM      Component Value Range Comment   Glucose-Capillary 156 (*) 70 - 99 mg/dL    Comment 1 Documented in Chart      Comment 2 Notify RN     CARDIAC PANEL(CRET KIN+CKTOT+MB+TROPI)     Status: Normal   Collection Time   02/15/12  8:30 AM      Component Value Range Comment   Total CK 98  7 - 232 U/L    CK, MB 1.8  0.3 - 4.0 ng/mL    Troponin I <0.30  <0.30 ng/mL    Relative Index RELATIVE INDEX IS INVALID  0.0 - 2.5   COMPREHENSIVE METABOLIC PANEL     Status: Abnormal   Collection Time   02/15/12  8:30 AM      Component Value Range Comment   Sodium 141  135 - 145 mEq/L    Potassium 3.5  3.5 - 5.1 mEq/L    Chloride 103  96 - 112 mEq/L    CO2 31  19 - 32 mEq/L    Glucose, Bld 136 (*) 70 - 99 mg/dL    BUN 14  6 - 23 mg/dL    Creatinine, Ser 0.92  0.50 - 1.35 mg/dL    Calcium 8.5  8.4 - 10.5 mg/dL    Total Protein 5.6 (*) 6.0 - 8.3 g/dL    Albumin 2.1 (*) 3.5 - 5.2 g/dL    AST 24  0 - 37 U/L    ALT 23  0 - 53 U/L    Alkaline Phosphatase 107  39 - 117 U/L    Total Bilirubin 0.8  0.3 -  1.2 mg/dL    GFR calc non Af Amer >90  >90 mL/min    GFR calc Af Amer >90  >90 mL/min   CBC WITH DIFFERENTIAL     Status: Normal   Collection Time   02/15/12  8:30 AM      Component Value Range Comment   WBC 6.5  4.0 - 10.5 K/uL    RBC 4.58  4.22 - 5.81 MIL/uL    Hemoglobin 13.5  13.0 - 17.0 g/dL    HCT 40.4  39.0 - 52.0 %    MCV 88.2  78.0 - 100.0 fL    MCH 29.5  26.0 - 34.0 pg    MCHC 33.4  30.0 - 36.0 g/dL    RDW 15.2  11.5 - 15.5 %    Platelets 245  150 - 400 K/uL    Neutrophils Relative 63  43 - 77 %    Neutro Abs 4.2  1.7 - 7.7 K/uL    Lymphocytes Relative 26  12 - 46 %    Lymphs Abs 1.7  0.7 - 4.0 K/uL    Monocytes Relative 9  3 - 12 %    Monocytes Absolute 0.6  0.1 - 1.0 K/uL    Eosinophils Relative 1  0 - 5 %    Eosinophils Absolute 0.1  0.0 - 0.7 K/uL    Basophils Relative 0  0 - 1 %    Basophils Absolute 0.0  0.0 - 0.1 K/uL    MAGNESIUM     Status: Normal   Collection Time   02/15/12  8:30 AM      Component Value Range Comment   Magnesium 1.7  1.5 - 2.5 mg/dL     Dg Chest 2 View  02/14/2012  *RADIOLOGY REPORT*  Clinical Data: Cough, shortness of breath.  CHEST - 2 VIEW  Comparison: 09/02/2009  Findings: Cardiomegaly.  Central vascular congestion.  Mild interstitial prominence and more confluent left lower lobe opacity. Small left pleural effusion not excluded.  No pneumothorax.  No acute osseous finding.  IMPRESSION: Cardiomegaly with central vascular congestion.  Pulmonary edema pattern.  More confluent left lower lobe opacity may represent a small effusion with atelectasis or pneumonia.   Original Report Authenticated By: Suanne Marker, M.D.     Review of Systems  Constitutional: Negative for fever and diaphoresis.  HENT: Negative for congestion and sore throat.   Eyes: Negative for blurred vision.  Respiratory: Negative for cough, shortness of breath and wheezing.        +DOE  Cardiovascular: Positive for leg swelling. Negative for chest pain, palpitations, orthopnea and PND.  Gastrointestinal: Negative for nausea, vomiting, abdominal pain and diarrhea.  Genitourinary: Negative for dysuria.  Neurological: Negative for dizziness and weakness.   Blood pressure 105/73, pulse 91, temperature 98.1 F (36.7 C), temperature source Oral, resp. rate 19, height 5\' 11"  (1.803 m), weight 161.8 kg (356 lb 11.3 oz), SpO2 97.00%. Physical Exam  Constitutional: He is oriented to person, place, and time. No distress.       Morbidly obese African American male  HENT:  Head: Normocephalic and atraumatic.  Eyes: EOM are normal. Pupils are equal, round, and reactive to light. No scleral icterus.  Neck: Normal range of motion. Neck supple.  Cardiovascular: Normal rate, regular rhythm, S1 normal and S2 normal.   No murmur heard. Pulses:      Radial pulses are 2+ on the right side, and 2+ on the left side.  Dorsalis pedis pulses are 2+ on the right side, and 2+ on the left side.       No carotid bruit.  Respiratory: Effort normal.       Rhonchi left side  GI: Bowel sounds are normal. He exhibits distension. There is no tenderness.  Musculoskeletal:       Tense lower extremity edema extending all the way to the lower back  Lymphadenopathy:    He has no cervical adenopathy.  Neurological: He is alert and oriented to person, place, and time. He exhibits normal muscle tone.  Skin: Skin is warm and dry.  Psychiatric: He has a normal mood and affect.    Assessment/Plan: Patient Active Hospital Problem List: CHF (congestive heart failure) (02/15/2012) DM2 (diabetes mellitus, type 2) (02/15/2012) Hypertension (02/15/2012) Cardiomyopathy:  EF 30-35% Echo 2010 (02/15/2012)    Plan:  IV lasix. Monitor fluids and daily weight.  2D echo pending.  BNP is elevated at 2637.0  Albumin is low..  Maybe third spacing because of this.  BP well controlled.  Check lipids.  May need NST as I do not see any record of one being completed nor a cardiac cath.  His brother had CABG at age 52.  Joshua Massey 02/15/2012, 10:40 AM

## 2012-02-15 NOTE — ED Provider Notes (Signed)
History     CSN: [id number]  Arrival date & time 02/14/12  2133   First MD Initiated Contact with Patient 02/15/12 0010      Chief Complaint  Patient presents with  . fluid build up     (Consider location/radiation/quality/duration/timing/severity/associated sxs/prior treatment) Patient is a 36 y.o. male presenting with shortness of breath. The history is provided by the patient.  Shortness of Breath  The current episode started more than 2 weeks ago. The onset was gradual. The problem occurs frequently. The problem has been gradually worsening. The problem is moderate. Nothing relieves the symptoms. Nothing aggravates the symptoms. Associated symptoms include shortness of breath. Pertinent negatives include no chest pain, no fever and no cough. There was no intake of a foreign body. He was not exposed to toxic fumes. He has not inhaled smoke recently. He has had no prior steroid use. His past medical history does not include bronchiolitis. He has been behaving normally. Urine output has been normal.  Complains of DOE and tightness in legs with ambulation x >2 weeks.    Past Medical History  Diagnosis Date  . Hypertension   . CHF (congestive heart failure)   . Diabetes mellitus     History reviewed. No pertinent past surgical history.  No family history on file.  History  Substance Use Topics  . Smoking status: Never Smoker   . Smokeless tobacco: Not on file  . Alcohol Use: No      Review of Systems  Constitutional: Negative for fever.  Respiratory: Positive for shortness of breath. Negative for cough.   Cardiovascular: Positive for leg swelling. Negative for chest pain.  All other systems reviewed and are negative.    Allergies  Review of patient's allergies indicates no known allergies.  Home Medications   Current Outpatient Rx  Name Route Sig Dispense Refill  . CARVEDILOL 12.5 MG PO TABS Oral Take 12.5 mg by mouth 2 (two) times daily with a meal.    .  FUROSEMIDE 40 MG PO TABS Oral Take 40 mg by mouth daily.    Marland Kitchen VALSARTAN 40 MG PO TABS Oral Take 40 mg by mouth daily.      BP 143/113  Pulse 112  Temp 99.4 F (37.4 C) (Oral)  Resp 26  SpO2 99%  Physical Exam  Constitutional: He is oriented to person, place, and time. He appears well-developed and well-nourished.  HENT:  Head: Normocephalic and atraumatic.  Mouth/Throat: Oropharynx is clear and moist.  Eyes: Conjunctivae are normal. Pupils are equal, round, and reactive to light.  Neck: Normal range of motion. Neck supple.  Cardiovascular: Regular rhythm.  Tachycardia present.   Pulmonary/Chest: He has decreased breath sounds.  Abdominal: Bowel sounds are normal. He exhibits distension. There is no tenderness. There is no rebound.  Musculoskeletal: He exhibits edema.  Neurological: He is alert and oriented to person, place, and time.  Skin: Skin is warm and dry. He is not diaphoretic.  Psychiatric: He has a normal mood and affect.    ED Course  Procedures (including critical care time)  Labs Reviewed  COMPREHENSIVE METABOLIC PANEL - Abnormal; Notable for the following:    Glucose, Bld 313 (*)     Albumin 2.5 (*)     Alkaline Phosphatase 130 (*)     All other components within normal limits  GLUCOSE, CAPILLARY - Abnormal; Notable for the following:    Glucose-Capillary 317 (*)     All other components within normal limits  CBC WITH  DIFFERENTIAL  TROPONIN I  PRO B NATRIURETIC PEPTIDE   Dg Chest 2 View  02/14/2012  *RADIOLOGY REPORT*  Clinical Data: Cough, shortness of breath.  CHEST - 2 VIEW  Comparison: 09/02/2009  Findings: Cardiomegaly.  Central vascular congestion.  Mild interstitial prominence and more confluent left lower lobe opacity. Small left pleural effusion not excluded.  No pneumothorax.  No acute osseous finding.  IMPRESSION: Cardiomegaly with central vascular congestion.  Pulmonary edema pattern.  More confluent left lower lobe opacity may represent a small  effusion with atelectasis or pneumonia.   Original Report Authenticated By: Suanne Marker, M.D.      No diagnosis found.    MDM   Date: 02/15/2012  Rate: 111  Rhythm: sinus tachycardia  QRS Axis: normal  Intervals: normal  ST/T Wave abnormalities: normal  Conduction Disutrbances:none  Narrative Interpretation:   Old EKG Reviewed: changes noted          Fumie Fiallo Alfonso Patten, MD 02/15/12 [phone]

## 2012-02-15 NOTE — ED Notes (Signed)
Pt is alert and talking.  LE edema from groin to feet after being out of Lasix for 2 months.  No respiratory distress

## 2012-02-15 NOTE — H&P (Signed)
Joshua Massey is an 36 y.o. male.   Patient was seen and examined on August 28th 2013 at 2 AM. PCP - Dr. Renato Shin. Cardiologist - [facility]. Chief Complaint: Lower extremity edema. HPI: 36 year old male with history of systolic heart failure l last EF measured in 2010 was 30-35%, diabetes mellitus type 2 presented to the ER because of increasing lower extremity edema. Patient states was last few weeks he has not been taking his Lasix and also has not taken metformin for last few months. He has minimal shortness of breath on exertion. Denies any chest pain. Chest x-ray shows congestive pattern. Patient has severe lower extremity edema with anasarca-like picture. Patient has been admitted for diuresis. Patient denies any fever chills cough or phlegm abdominal pain nausea vomiting or diarrhea.  Past Medical History  Diagnosis Date  . Hypertension   . CHF (congestive heart failure)   . Diabetes mellitus     History reviewed. No pertinent past surgical history.  Family History  Problem Relation Age of Onset  . Coronary artery disease Brother    Social History:  reports that he has never smoked. He does not have any smokeless tobacco history on file. He reports that he does not drink alcohol. His drug history not on file.  Allergies: No Known Allergies   (Not in a hospital admission)  Results for orders placed during the hospital encounter of 02/15/12 (from the past 48 hour(s))  CBC WITH DIFFERENTIAL     Status: Normal   Collection Time   02/14/12  9:55 PM      Component Value Range Comment   WBC 8.4  4.0 - 10.5 K/uL    RBC 4.94  4.22 - 5.81 MIL/uL    Hemoglobin 14.7  13.0 - 17.0 g/dL    HCT 43.3  39.0 - 52.0 %    MCV 87.7  78.0 - 100.0 fL    MCH 29.8  26.0 - 34.0 pg    MCHC 33.9  30.0 - 36.0 g/dL    RDW 15.0  11.5 - 15.5 %    Platelets 275  150 - 400 K/uL    Neutrophils Relative 72  43 - 77 %    Neutro Abs 6.0  1.7 - 7.7 K/uL    Lymphocytes Relative 19   12 - 46 %    Lymphs Abs 1.6  0.7 - 4.0 K/uL    Monocytes Relative 9  3 - 12 %    Monocytes Absolute 0.7  0.1 - 1.0 K/uL    Eosinophils Relative 1  0 - 5 %    Eosinophils Absolute 0.1  0.0 - 0.7 K/uL    Basophils Relative 0  0 - 1 %    Basophils Absolute 0.0  0.0 - 0.1 K/uL   COMPREHENSIVE METABOLIC PANEL     Status: Abnormal   Collection Time   02/14/12  9:55 PM      Component Value Range Comment   Sodium 138  135 - 145 mEq/L    Potassium 3.8  3.5 - 5.1 mEq/L    Chloride 100  96 - 112 mEq/L    CO2 30  19 - 32 mEq/L    Glucose, Bld 313 (*) 70 - 99 mg/dL    BUN 14  6 - 23 mg/dL    Creatinine, Ser 0.90  0.50 - 1.35 mg/dL    Calcium 8.8  8.4 - 10.5 mg/dL    Total Protein 6.6  6.0 - 8.3  g/dL    Albumin 2.5 (*) 3.5 - 5.2 g/dL    AST 27  0 - 37 U/L    ALT 28  0 - 53 U/L    Alkaline Phosphatase 130 (*) 39 - 117 U/L    Total Bilirubin 1.0  0.3 - 1.2 mg/dL    GFR calc non Af Amer >90  >90 mL/min    GFR calc Af Amer >90  >90 mL/min   TROPONIN I     Status: Normal   Collection Time   02/14/12  9:55 PM      Component Value Range Comment   Troponin I <0.30  <0.30 ng/mL   PRO B NATRIURETIC PEPTIDE     Status: Abnormal   Collection Time   02/14/12  9:55 PM      Component Value Range Comment   Pro B Natriuretic peptide (BNP) 2637.0 (*) 0 - 125 pg/mL   GLUCOSE, CAPILLARY     Status: Abnormal   Collection Time   02/14/12 10:12 PM      Component Value Range Comment   Glucose-Capillary 317 (*) 70 - 99 mg/dL    Dg Chest 2 View  02/14/2012  *RADIOLOGY REPORT*  Clinical Data: Cough, shortness of breath.  CHEST - 2 VIEW  Comparison: 09/02/2009  Findings: Cardiomegaly.  Central vascular congestion.  Mild interstitial prominence and more confluent left lower lobe opacity. Small left pleural effusion not excluded.  No pneumothorax.  No acute osseous finding.  IMPRESSION: Cardiomegaly with central vascular congestion.  Pulmonary edema pattern.  More confluent left lower lobe opacity may represent a small  effusion with atelectasis or pneumonia.   Original Report Authenticated By: Suanne Marker, M.D.     Review of Systems  Constitutional: Negative.   HENT: Negative.   Eyes: Negative.   Respiratory: Positive for shortness of breath.   Cardiovascular: Negative.   Gastrointestinal: Negative.   Genitourinary: Negative.   Musculoskeletal:       Lower extremity edema.  Skin: Negative.   Neurological: Negative.   Psychiatric/Behavioral: Negative.     Blood pressure 116/81, pulse 112, temperature 99.4 F (37.4 C), temperature source Oral, resp. rate 17, SpO2 99.00%. Physical Exam  Constitutional: He is oriented to person, place, and time. He appears well-developed and well-nourished. No distress.  HENT:  Head: Normocephalic and atraumatic.  Eyes: Conjunctivae are normal. Pupils are equal, round, and reactive to light. Right eye exhibits no discharge. Left eye exhibits no discharge. No scleral icterus.  Neck: Normal range of motion. Neck supple.  Cardiovascular:       Mild sinus tachycardia.  Respiratory: Effort normal and breath sounds normal. No respiratory distress. He has no wheezes. He has no rales.  GI: Soft. Bowel sounds are normal. He exhibits no distension. There is no tenderness. There is no rebound.  Musculoskeletal: He exhibits edema (Bilateral lower extemity edema extending to thigh.). He exhibits no tenderness.  Neurological: He is alert and oriented to person, place, and time.       Moves all extremities.  Skin: Skin is warm and dry. He is not diaphoretic.  Psychiatric: His behavior is normal.     Assessment/Plan #1. Decompensated CHF - patient has been started on Lasix 40 mg IV which I will continue every 6 hourly. Closely follow intake output and daily weight. Consult cardiologist in a.m. Continue ARB. #2. Uncontrolled diabetes mellitus2 - secondary to noncompliance with medications. At this time I have placed patient on Lantus 5 units subcutaneous daily in a.m.  with  sliding-scale coverage. Check hemoglobin A1c.  CODE STATUS  - full code.  Tarina Volk N. 02/15/2012, 2:47 AM

## 2012-02-16 ENCOUNTER — Encounter (HOSPITAL_COMMUNITY)
Admission: EM | Disposition: A | Discharge status: Home / Self Care | Payer: Self-pay | Source: Home / Self Care | Attending: Internal Medicine

## 2012-02-16 DIAGNOSIS — I428 Other cardiomyopathies: Secondary | ICD-10-CM

## 2012-02-16 DIAGNOSIS — I5023 Acute on chronic systolic (congestive) heart failure: Secondary | ICD-10-CM | POA: Diagnosis present

## 2012-02-16 DIAGNOSIS — E669 Obesity, unspecified: Secondary | ICD-10-CM | POA: Diagnosis present

## 2012-02-16 DIAGNOSIS — I472 Ventricular tachycardia: Secondary | ICD-10-CM | POA: Diagnosis not present

## 2012-02-16 HISTORY — PX: LEFT AND RIGHT HEART CATHETERIZATION WITH CORONARY ANGIOGRAM: SHX5449

## 2012-02-16 HISTORY — PX: CARDIAC CATHETERIZATION: SHX172

## 2012-02-16 LAB — GLUCOSE, CAPILLARY
Glucose-Capillary: 235 mg/dL — ABNORMAL HIGH (ref 70–99)
Glucose-Capillary: 222 mg/dL — ABNORMAL HIGH (ref 70–99)
Glucose-Capillary: 164 mg/dL — ABNORMAL HIGH (ref 70–99)
Glucose-Capillary: 191 mg/dL — ABNORMAL HIGH (ref 70–99)

## 2012-02-16 LAB — POCT I-STAT 3, ART BLOOD GAS (G3+)
Acid-Base Excess: 5 mmol/L — ABNORMAL HIGH (ref 0.0–2.0)
Bicarbonate: 27.4 mEq/L — ABNORMAL HIGH (ref 20.0–24.0)

## 2012-02-16 LAB — BASIC METABOLIC PANEL
BUN: 16 mg/dL (ref 6–23)
Chloride: 99 mEq/L (ref 96–112)
Glucose, Bld: 242 mg/dL — ABNORMAL HIGH (ref 70–99)
Potassium: 3.5 mEq/L (ref 3.5–5.1)
Sodium: 137 mEq/L (ref 135–145)

## 2012-02-16 LAB — LIPID PANEL
HDL: 39 mg/dL — ABNORMAL LOW (ref >39)
LDL Cholesterol: 42 mg/dL (ref 0–99)
Total CHOL/HDL Ratio: 2.6 RATIO

## 2012-02-16 LAB — POCT I-STAT 3, VENOUS BLOOD GAS (G3P V)
Acid-Base Excess: 5 mmol/L — ABNORMAL HIGH (ref 0.0–2.0)
pO2, Ven: 26 mmHg — CL (ref 30.0–45.0)

## 2012-02-16 SURGERY — LEFT AND RIGHT HEART CATHETERIZATION WITH CORONARY ANGIOGRAM
Anesthesia: LOCAL

## 2012-02-16 MED ORDER — VERAPAMIL HCL 2.5 MG/ML IV SOLN
INTRAVENOUS | Status: AC
  Filled 2012-02-16: qty 2

## 2012-02-16 MED ORDER — FUROSEMIDE 10 MG/ML IJ SOLN
80.0000 mg | Freq: Two times a day (BID) | INTRAMUSCULAR | Status: DC
  Administered 2012-02-17 – 2012-02-20 (×7): 80 mg via INTRAVENOUS
  Filled 2012-02-16 (×9): qty 8

## 2012-02-16 MED ORDER — FENTANYL CITRATE 0.05 MG/ML IJ SOLN
INTRAMUSCULAR | Status: AC
  Filled 2012-02-16: qty 2

## 2012-02-16 MED ORDER — ACETAMINOPHEN 325 MG PO TABS
650.0000 mg | ORAL_TABLET | ORAL | Status: DC | PRN
  Administered 2012-02-18 – 2012-02-23 (×9): 650 mg via ORAL
  Filled 2012-02-16 (×3): qty 2

## 2012-02-16 MED ORDER — LIDOCAINE HCL (PF) 1 % IJ SOLN
INTRAMUSCULAR | Status: AC
  Filled 2012-02-16: qty 30

## 2012-02-16 MED ORDER — CARVEDILOL 12.5 MG PO TABS
18.7500 mg | ORAL_TABLET | Freq: Two times a day (BID) | ORAL | Status: DC
  Filled 2012-02-16 (×2): qty 1

## 2012-02-16 MED ORDER — CARVEDILOL 12.5 MG PO TABS
12.5000 mg | ORAL_TABLET | Freq: Two times a day (BID) | ORAL | Status: DC
  Administered 2012-02-16: 12.5 mg via ORAL
  Filled 2012-02-16 (×2): qty 1

## 2012-02-16 MED ORDER — SODIUM CHLORIDE 0.9 % IV SOLN
1.0000 mL/kg/h | INTRAVENOUS | Status: AC
  Administered 2012-02-16: 1 mL/kg/h via INTRAVENOUS

## 2012-02-16 MED ORDER — GLIPIZIDE 2.5 MG HALF TABLET
2.5000 mg | ORAL_TABLET | Freq: Two times a day (BID) | ORAL | Status: DC
  Administered 2012-02-16 – 2012-02-17 (×2): 2.5 mg via ORAL
  Filled 2012-02-16 (×4): qty 1

## 2012-02-16 MED ORDER — DIGOXIN 250 MCG PO TABS
0.2500 mg | ORAL_TABLET | Freq: Every day | ORAL | Status: DC
  Administered 2012-02-16 – 2012-02-24 (×9): 0.25 mg via ORAL
  Filled 2012-02-16 (×9): qty 1

## 2012-02-16 MED ORDER — SPIRONOLACTONE 25 MG PO TABS
25.0000 mg | ORAL_TABLET | Freq: Every day | ORAL | Status: DC
  Administered 2012-02-16 – 2012-02-24 (×9): 25 mg via ORAL
  Filled 2012-02-16 (×9): qty 1

## 2012-02-16 MED ORDER — CARVEDILOL 3.125 MG PO TABS
3.1250 mg | ORAL_TABLET | Freq: Two times a day (BID) | ORAL | Status: DC
  Administered 2012-02-17 – 2012-02-24 (×15): 3.125 mg via ORAL
  Filled 2012-02-16 (×18): qty 1

## 2012-02-16 MED ORDER — MIDAZOLAM HCL 2 MG/2ML IJ SOLN
INTRAMUSCULAR | Status: AC
  Filled 2012-02-16: qty 2

## 2012-02-16 MED ORDER — INSULIN GLARGINE 100 UNIT/ML ~~LOC~~ SOLN
10.0000 [IU] | Freq: Every day | SUBCUTANEOUS | Status: DC
  Administered 2012-02-16 – 2012-02-23 (×8): 10 [IU] via SUBCUTANEOUS

## 2012-02-16 MED ORDER — HEPARIN (PORCINE) IN NACL 2-0.9 UNIT/ML-% IJ SOLN
INTRAMUSCULAR | Status: AC
  Filled 2012-02-16: qty 2000

## 2012-02-16 MED ORDER — MILRINONE IN DEXTROSE 200-5 MCG/ML-% IV SOLN
0.2500 ug/kg/min | INTRAVENOUS | Status: DC
  Administered 2012-02-16 – 2012-02-20 (×9): 0.25 ug/kg/min via INTRAVENOUS
  Filled 2012-02-16 (×13): qty 100

## 2012-02-16 MED ORDER — NITROGLYCERIN 0.2 MG/ML ON CALL CATH LAB
INTRAVENOUS | Status: AC
  Filled 2012-02-16: qty 1

## 2012-02-16 MED ORDER — ONDANSETRON HCL 4 MG/2ML IJ SOLN: 4.0000 mg | Freq: Four times a day (QID) | INTRAMUSCULAR | Status: DC | PRN

## 2012-02-16 NOTE — Progress Notes (Signed)
Pt had 7beat run of v tach, stable, denies pain. Text page to MD.

## 2012-02-16 NOTE — Progress Notes (Addendum)
[facility]  Subjective: Feels better  Objective: Vital signs in last 24 hours: Temp:  [97.5 F (36.4 C)-99.1 F (37.3 C)] 97.5 F (36.4 C) (08/29 0500) Pulse Rate:  [91-99] 99  (08/29 0755) Resp:  [18-20] 18  (08/29 0500) BP: (105-123)/(73-93) 123/93 mmHg (08/29 0755) SpO2:  [97 %-100 %] 98 % (08/29 0500) Weight:  [159.757 kg (352 lb 3.2 oz)] 159.757 kg (352 lb 3.2 oz) (08/29 0500) Last BM Date: 02/15/12  Intake/Output from previous day: 08/28 0701 - 08/29 0700 In: 1162 [P.O.:1162] Out: 4025 [Urine:4025] Intake/Output this shift:    Medications Current Facility-Administered Medications  Medication Dose Route Frequency Provider Last Rate Last Dose  . 0.9 %  sodium chloride infusion  250 mL Intravenous PRN Pixie Casino, MD      . acetaminophen (TYLENOL) tablet 650 mg  650 mg Oral Q6H PRN Rise Patience, MD       Or  . acetaminophen (TYLENOL) suppository 650 mg  650 mg Rectal Q6H PRN Rise Patience, MD      . aspirin EC tablet 325 mg  325 mg Oral Daily Rise Patience, MD   325 mg at 02/15/12 1108  . carvedilol (COREG) tablet 18.75 mg  18.75 mg Oral BID WC Tarri Fuller, PA      . enoxaparin (LOVENOX) injection 80 mg  80 mg Subcutaneous Q24H Rise Patience, MD   80 mg at 02/16/12 0539  . furosemide (LASIX) injection 40 mg  40 mg Intravenous Q6H Rise Patience, MD   40 mg at 02/16/12 0539  . insulin aspart (novoLOG) injection 0-9 Units  0-9 Units Subcutaneous TID WC Rise Patience, MD   3 Units at 02/16/12 0827  . irbesartan (AVAPRO) tablet 75 mg  75 mg Oral Daily Rise Patience, MD   75 mg at 02/15/12 1108  . metFORMIN (GLUCOPHAGE) tablet 500 mg  500 mg Oral BID WC Domenic Polite, MD   500 mg at 02/15/12 1526  . ondansetron (ZOFRAN) tablet 4 mg  4 mg Oral Q6H PRN Rise Patience, MD       Or  . ondansetron [facility]) injection 4 mg  4 mg Intravenous Q6H PRN Rise Patience, MD      . potassium chloride  SA (K-DUR,KLOR-CON) CR tablet 20 mEq  20 mEq Oral Daily Rise Patience, MD   20 mEq at 02/15/12 1107  . sodium chloride 0.9 % injection 3 mL  3 mL Intravenous Q12H Rise Patience, MD   3 mL at 02/15/12 2300  . sodium chloride 0.9 % injection 3 mL  3 mL Intravenous Q12H Rise Patience, MD      . sodium chloride 0.9 % injection 3 mL  3 mL Intravenous Q12H Pixie Casino, MD      . sodium chloride 0.9 % injection 3 mL  3 mL Intravenous PRN Pixie Casino, MD      . DISCONTD: carvedilol (COREG) tablet 12.5 mg  12.5 mg Oral BID WC Rise Patience, MD   12.5 mg at 02/16/12 0759  . DISCONTD: insulin glargine (LANTUS) injection 5 Units  5 Units Subcutaneous Daily Rise Patience, MD   5 Units at 02/15/12 1109    PE: General appearance: alert, cooperative and no distress Lungs: clear to auscultation bilaterally Heart: regular rate and rhythm, S1, S2 normal, no murmur, click, rub or gallop Abdomen: Nontender, +BS Extremities: Tense Edema through thighs to lower back. Pulses:  2+ and symmetric Skin: warm and dry Neurologic: Grossly normal Poor Allen's test  Lab Results:   Basename 02/15/12 1710 02/15/12 0830 02/14/12 2155  WBC 7.5 6.5 8.4  HGB 13.7 13.5 14.7  HCT 41.3 40.4 43.3  PLT 248 245 275   BMET  Basename 02/16/12 0525 02/15/12 1710 02/15/12 0830  NA 137 138 141  K 3.5 3.9 3.5  CL 99 100 103  CO2 30 31 31   GLUCOSE 242* 253* 136*  BUN 16 16 14   CREATININE 1.07 1.07 0.92  CALCIUM 8.7 8.3* 8.5   PT/INR  Basename 02/15/12 1710  LABPROT 15.1  INR 1.17   Cholesterol  Basename 02/16/12 0525  CHOL 100   Lipid Panel     Component Value Date/Time   CHOL 100 02/16/2012 0525   TRIG 94 02/16/2012 0525   HDL 39* 02/16/2012 0525   CHOLHDL 2.6 02/16/2012 0525   VLDL 19 02/16/2012 0525   LDLCALC 42 02/16/2012 0525      Cardiac Enzymes No components found with this basename: TROPONIN:3, CKMB:3  Studies/Results: Study Conclusions  - Left ventricle:  The cavity size was severely dilated. Wall thickness was normal with normal LV mass index and geometry. Severe global hypokinesis. LVEF <20%. Doppler parameters are consistent with restrictive left ventricular relaxation (grade 3 diastolic dysfunction). The E/e' ratio is >25, suggesting markedly elevated LV filling pressure. - Mitral valve: Structurally normal valve. Mild regurgitation. - Left atrium: Severely dilated (>40 ml/m2). - Right ventricle: The cavity size was normal. Normal RV wall thickness. Reduced RV systolic function (TAPSE 8). The moderator band was prominent. - Right atrium: Moderately dilated. - Atrial septum: No defect or patent foramen ovale was identified. - Pulmonic valve: Trivial regurgitation. - Pulmonary arteries: PA peak pressure: 22mm Hg (S). - Inferior vena cava: The vessel was dilated; the respirophasic diameter changes were blunted (< 50%); findings are consistent with elevated central venous pressure. - Pericardium, extracardiac: A trivial pericardial effusion was identified posterior to the heart.  Assessment/Plan  Principal Problem:  *CHF (congestive heart failure) Active Problems:  NSVT (nonsustained ventricular tachycardia)  DM2 (diabetes mellitus, type 2)  Hypertension  Cardiomyopathy:  EF<20%  02/15/2012  Obesity  Plan:  EF <20% by echo yesterday. Two episodes of 10 beat NSVT.  Will titrate Coreg to 18.75. Hold parameters written. It is likely that he will need a life vest and given the chronic nature of his cardiomyopathy, and likelyhood that it is nonischemic, an ICD will be necessary.   2.4L diuresed in the last 24 hours.  Lipid panel looks relatively good.  Trig: HDL = 2.4.  LDL 42.  Left and Right heart caths today.   LOS: 1 day    HAGER, BRYAN 02/16/2012 9:01 AM  Pt. Seen and examined. Agree with the NP/PA-C note as written. Significant cardiomyopathy of unknown etiology, possibly ischemic given family history of brother with CABG  at age 67 and multiple risk factors. We discussed left and right heart catheterization. I discussed the case with Dr. Haroldine Laws and he will see the patient. He has been having NSVT and is at increased risk for life-threatening arrhythmias during cath. Urine output has been excellent with 2.4L out in the last 24 hours. I would not increase his b-blocker further due to his acute decompensation.  Pixie Casino, MD, [facility] Attending Cardiologist [facility] [place]

## 2012-02-16 NOTE — Progress Notes (Signed)
Pt still refusing video, states "it will make me too nervous"

## 2012-02-16 NOTE — Progress Notes (Signed)
UR Completed [name], RN,BSN [phone]  

## 2012-02-16 NOTE — CV Procedure (Addendum)
[facility]     CARDIAC CATHETERIZATION REPORT  Joshua Massey   [id number] 1976-03-28  Performing Cardiologist: Pixie Casino Primary Physician: Renato Shin, MD Primary Cardiologist:  Dr. Debara Pickett  Procedures Performed:  Left Heart Catheterization via 5 Fr right femoral artery access  Native Coronary Angiography  Indication(s): cardiomyopathy  History: 36 y.o. male with a history of morbid obesity, HTN, diabetes and prior bilateral hip surgery, as well as a known cardiomyopathy with EF 35% in 2010, who did not follow-up with our office. He now presented in decompensated heart failure and echo now shows LVEF of ~15% with global hypokinesis. He is now referred for [facility] and coronary evaluation.  Of note, his brother had CABG at age 65 as well.  Consent: The procedure with Risks/Benefits/Alternatives and Indications was reviewed with the patient (and family).  All questions were answered.    Risks / Complications include, but not limited to: Death, MI, CVA/TIA, VF/VT (with defibrillation), Bradycardia (need for temporary pacer placement), contrast induced nephropathy, bleeding / bruising / hematoma / pseudoaneurysm, vascular or coronary injury (with possible emergent CT or Vascular Surgery), adverse medication reactions, infection.    The patient (and family) voice understanding and agree to proceed.    Risks of procedure as well as the alternatives and risks of each were explained to the (patient/caregiver).  Consent for procedure obtained. Consent for signed by MD and patient with RN witness -- placed on chart.  Procedure: The patient was brought to the [facility] in the fasting state and prepped and draped in the usual sterile fashion for (Right groin) access.  A modified Allen's test with plethysmography was performed on the right wrist demonstrating adequate Ulnar Artery collateral flow.   Sterile technique was  used including antiseptics, cap, gloves, gown, hand hygiene, mask and sheet.  Skin prep: Chlorhexidine;  Time Out: Verified patient identification, verified procedure, site/side was marked, verified correct patient position, special equipment/implants available, medications/allergies/relevent history reviewed, required imaging and test results available.  Performed  The right femoral head was identified using tactile and fluoroscopic technique. 2 screws were noted into the head of the femur. The right groin was anesthetized with 1% subcutaneous Lidocaine.  The right Common Femoral Artery was accessed using the Modified Seldinger Technique with placement of a antimicrobial bonded/coated single lumen (5 Fr) sheath was placed using the Seldinger technique.  The sheath was aspirated and flushed.  A 50F venous catheter was placed in the right common femoral vein using a smart needle and ultrasound guidance.  Access was complex and Dr. Ellouise Newer was called in to assist with access and the procedure. Right radial artery was unsuccessfully attempted as well.  A 5 Fr JL4 Catheter was advanced of over a Standard J wire into the ascending Aorta.  The catheter was used to engage the left coronary artery.  Multiple cineangiographic views of the left coronary artery system(s) were performed. A 5 Fr JR4 Catheter was advanced of over a Safety J wire into the ascending Aorta.  The catheter was used to engage the right coronary artery.  Multiple cineangiographic views of the right coronary artery system(s) were performed. This catheter was then exchanged over the Standard J wire for an angled Pigtail catheter that was advanced across the Aortic Valve.  LV hemodynamics were measured and the catheter was pulled back across the Aortic Valve for measurement of "pull-back" gradient. A swan-ganz catheter was place into the femoral vein sheath and  advanced into the RA, RV, and PA positions. We were unable to wedge the swan with a  wire and multiple techniques. Cardiac output by fick and thermodilution was obtained. The catheters and the wires were removed completely out of the body.  A final femoral angiogram was obtained to demonstrate adequate catheter position and lack of extravasation.  The patient was transferred to the holding area where the sheath was removed with manual pressure held for hemostasis.   The patient was transported to the cath lab holding area in stable condition.   The patient  was stable before, during and following the procedure.   Patient did tolerate procedure well. There were not complications.  EBL: 10 cc  Medications:  Premedication: none  Sedation:  3 mg IV Versed, 25 IV mcg Fentanyl  Contrast:  50 cc Omnipaque   Hemodynamics:  Central Aortic Pressure / Mean Aortic Pressure: 111/88  LV Pressure / LV End diastolic Pressure:  28  Right Heart Catheterization:  RA - 28  RV - 61/29  PA - 63/18 (31)  LV/RV - simultaneous tracings indicate diastolic equalization of pressures and a "square root sign"  FCO/CI - 4.28/1.59  TDCO/CI - 3.46/1.28  AO Sat - 94% (room air)  PA Sat - 49% (room air)  TPG - could not be calculated, however, likely low given the high LVEDP (suggesting venous hypertension from left heart failure)  PVR = 1.1 wood units  Coronary Angiographic Data:  Left Main:  Angiographically normal, short vessel which bifurcates early into the LAD and LCx systems.  Left Anterior Descending (LAD):  Smooth appearing vessel with few branches, extends to the apex. There is a tubular 50% mid-vessel stenosis just distal to the takeoff of a large D1 branch.  1st diagonal (D1):  Large branch without angiographic stenosis.  Circumflex (LCx):  Angiographically normal vessel, somewhat tortuous.  Right Coronary Artery: Dominant vessel, no significant stenosis.  Impression: 1.  Moderate tubular mid-LAD lesion, otherwise no significant obstructive CAD. 2.  Markedly elevated left and  right heart pressures with restrictive physiology and reduced cardiac output.  Plan: 1.  Continued diuresis - may benefit from inotropic support. 2.  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  Consider cMRI early on. 3.  Will likely need a LifeVest (if he can fit in it) as bridge to an AICD if his LVEF does not improve with medical therapy. 4.  Will discuss further with the Advanced HF service.  The case and results was discussed with the patient  The case and results was not discussed with the patient's PCP. The case and results was discussed with the patient's Cardiologist.  Time Spend Directly with Patient:  180 minutes  Pixie Casino, MD, [facility] Attending Cardiologist [facility] [place] [facility] 02/16/2012, 4:12 PM

## 2012-02-16 NOTE — Progress Notes (Signed)
Inpatient Diabetes Program Recommendations  AACE/ADA: New Consensus Statement on Inpatient Glycemic Control (2013)  Target Ranges:  Prepandial:   less than 140 mg/dL      Peak postprandial:   less than 180 mg/dL (1-2 hours)      Critically ill patients:  140 - 180 mg/dL   Reason for Visit: Hyperglycemia  Inpatient Diabetes Program Recommendations Insulin - Basal: Pt would benefit from basal lantus whie in the hospital of 10 units daily or HS to start.  Correction (SSI): Please increase to moderate tidwc and add HS scale  Note: Thank you, Rosita Kea, RN, CNS, Diabetes Coordinator [phone])

## 2012-02-16 NOTE — Care Management Note (Unsigned)
    Page 1 of 1   02/16/2012     2:24:30 PM   CARE MANAGEMENT NOTE 02/16/2012  Patient:  Joshua Massey, Joshua Massey   Account Number:  [id number]  Date Initiated:  02/16/2012  Documentation initiated by:  GRAVES-BIGELOW,Gaius Ishaq  Subjective/Objective Assessment:   Pt admitted with CHF exacerbation. Has an extensive hx. Pt will benefit from [facility] RN for disease management and will need assistance with medications.     Action/Plan:   CM did sspeak to CSW Amy and she states he should be eligible for outpatient pharmacy medicaiton assistance via the CHF Program. CM will continue to monitor.   Anticipated DC Date:  02/20/2012   Anticipated DC Plan:  [place]  CM consult      Choice offered to / List presented to:             Status of service:  In process, will continue to follow Medicare Important Message given?   (If response is "NO", the following Medicare IM given date fields will be blank) Date Medicare IM given:   Date Additional Medicare IM given:    Discharge Disposition:    Per UR Regulation:  Reviewed for med. necessity/level of care/duration of stay  If discussed at [place] of Stay Meetings, dates discussed:    Comments:

## 2012-02-16 NOTE — Progress Notes (Signed)
[facility]             Progress Note   Subjective: Doing well, mild diarrhea with metformin  Objective: Vital signs in last 24 hours: Temp:  [97.5 F (36.4 C)-99.1 F (37.3 C)] 97.5 F (36.4 C) (08/29 0500) Pulse Rate:  [94-99] 99  (08/29 0755) Resp:  [18-20] 18  (08/29 0500) BP: (111-123)/(80-93) 123/93 mmHg (08/29 0755) SpO2:  [98 %-100 %] 98 % (08/29 0500) Weight:  [159.757 kg (352 lb 3.2 oz)] 159.757 kg (352 lb 3.2 oz) (08/29 0500) Weight change: -2.043 kg (-4 lb 8.1 oz) Last BM Date: 02/15/12  Intake/Output from previous day: 08/28 0701 - 08/29 0700 In: 1162 [P.O.:1162] Out: 4025 [Urine:4025] Total I/O In: 0  Out: 700 [Urine:700]   Physical Exam: General: Alert, awake, oriented x3, in no acute distress. HEENT: No bruits, no goiter. Heart: Regular rate and rhythm, without murmurs, rubs, gallops. Lungs: Clear to auscultation bilaterally. Abdomen: Soft, nontender, nondistended, positive bowel sounds. Extremities: tense 2 plus edema, No clubbing cyanosis or edema with positive pedal pulses. Neuro: Grossly intact, nonfocal.    Lab Results: Basic Metabolic Panel:  Basename 02/16/12 0525 02/15/12 1710 02/15/12 0830  NA 137 138 --  K 3.5 3.9 --  CL 99 100 --  CO2 30 31 --  GLUCOSE 242* 253* --  BUN 16 16 --  CREATININE 1.07 1.07 --  CALCIUM 8.7 8.3* --  MG -- -- 1.7  PHOS -- -- --   Liver Function Tests:  Basename 02/15/12 0830 02/14/12 2155  AST 24 27  ALT 23 28  ALKPHOS 107 130*  BILITOT 0.8 1.0  PROT 5.6* 6.6  ALBUMIN 2.1* 2.5*   No results found for this basename: LIPASE:2,AMYLASE:2 in the last 72 hours No results found for this basename: AMMONIA:2 in the last 72 hours CBC:  Basename 02/15/12 1710 02/15/12 0830 02/14/12 2155  WBC 7.5 6.5 --  NEUTROABS -- 4.2 6.0  HGB 13.7 13.5 --  HCT 41.3 40.4 --  MCV 88.2 88.2 --  PLT 248 245 --   Cardiac Enzymes:  Basename 02/15/12 0830 02/14/12 2155  CKTOTAL 98 --  CKMB 1.8 --    CKMBINDEX -- --  TROPONINI <0.30 <0.30   BNP:  Basename 02/14/12 2155  PROBNP 2637.0*   D-Dimer: No results found for this basename: DDIMER:2 in the last 72 hours CBG:  Basename 02/16/12 1108 02/16/12 0705 02/15/12 2131 02/15/12 1647 02/15/12 1118 02/15/12 0627  GLUCAP 222* 235* 267* 227* 166* 156*   Hemoglobin A1C:  Basename 02/15/12 0830  HGBA1C 14.3*   Fasting Lipid Panel:  Basename 02/16/12 0525  CHOL 100  HDL 39*  LDLCALC 42  TRIG 94  CHOLHDL 2.6  LDLDIRECT --   Thyroid Function Tests:  Basename 02/15/12 0830  TSH 1.777  T4TOTAL --  FREET4 --  T3FREE --  THYROIDAB --   Anemia Panel: No results found for this basename: VITAMINB12,FOLATE,FERRITIN,TIBC,IRON,RETICCTPCT in the last 72 hours Coagulation:  Basename 02/15/12 1710  LABPROT 15.1  INR 1.17   Urine Drug Screen: Drugs of Abuse  No results found for this basename: labopia, cocainscrnur, labbenz, amphetmu, thcu, labbarb    Alcohol Level: No results found for this basename: ETH:2 in the last 72 hours Urinalysis: No results found for this basename: COLORURINE:2,APPERANCEUR:2,LABSPEC:2,PHURINE:2,GLUCOSEU:2,HGBUR:2,BILIRUBINUR:2,KETONESUR:2,PROTEINUR:2,UROBILINOGEN:2,NITRITE:2,LEUKOCYTESUR:2 in the last 72 hours  No results found for this or any previous visit (from the past 240 hour(s)).  Studies/Results: Dg Chest 2 View  02/14/2012  *RADIOLOGY REPORT*  Clinical Data: Cough, shortness  of breath.  CHEST - 2 VIEW  Comparison: 09/02/2009  Findings: Cardiomegaly.  Central vascular congestion.  Mild interstitial prominence and more confluent left lower lobe opacity. Small left pleural effusion not excluded.  No pneumothorax.  No acute osseous finding.  IMPRESSION: Cardiomegaly with central vascular congestion.  Pulmonary edema pattern.  More confluent left lower lobe opacity may represent a small effusion with atelectasis or pneumonia.   Original Report Authenticated By: Suanne Marker, M.D.      Medications: Scheduled Meds:   . aspirin EC  325 mg Oral Daily  . carvedilol  12.5 mg Oral BID WC  . enoxaparin (LOVENOX) injection  80 mg Subcutaneous Q24H  . furosemide  40 mg Intravenous Q6H  . heparin      . insulin aspart  0-9 Units Subcutaneous TID WC  . irbesartan  75 mg Oral Daily  . lidocaine      . metFORMIN  500 mg Oral BID WC  . nitroGLYCERIN      . potassium chloride  20 mEq Oral Daily  . sodium chloride  3 mL Intravenous Q12H  . sodium chloride  3 mL Intravenous Q12H  . sodium chloride  3 mL Intravenous Q12H  . DISCONTD: carvedilol  12.5 mg Oral BID WC  . DISCONTD: carvedilol  18.75 mg Oral BID WC   Continuous Infusions:  PRN Meds:.sodium chloride, acetaminophen, acetaminophen, ondansetron (ZOFRAN) IV, ondansetron, sodium chloride  Assessment/Plan:  Principal Problem:  *CHF (congestive heart failure) Active Problems:  DM2 (diabetes mellitus, type 2)  Hypertension  Cardiomyopathy:  EF<20%  02/15/2012  NSVT (nonsustained ventricular tachycardia)  Obesity PLan: Workup of cardiomyopathy and management per cards, likely nonischemic Interms of DM: Hbaic 14.3,  reluctant to take insulin at home Hold metformin due to cath today Start glipizide BID   LOS: 1 day   [facility] Pager: [phone] 02/16/2012, 1:20 PM

## 2012-02-16 NOTE — Consult Note (Signed)
Advanced Heart Failure Team Consult Note  Referring Physician: Dr Debara Pickett Primary Physician: [name] Primary Cardiologist: Dr Rollene Fare   Reason for Consultation: Heart Failure  HPI:   [name] is a 36 year old AA with a history of DM, HTN, and chronic systolic heart failure (diagnosed in 2010 EF 30-35%).  He has been followed by Dr Rollene Fare for HTN and heart failure but he has not been seen since 05/2010.    He has been out of all medications for the last 2-3 months. Over the last 2 weeks he has developed increased lower extremity edema and dyspnea with exertion. Weighs on occasion. Weight at home 320 pounds.  He occasionally checks his blood pressure and reports SBP 140s. Poorly controlled diabetes with glucose levels consistently >300.  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  Family history of heart disease noted in brother and father.  He has 6 children with 2 children living at his home. He does not smoke or drink alcohol.   Presented to the [facility] ED 02/15/12 due to increased dyspnea and lower extremity edema. Admit Pro BNP 2637. TSH ok. Hgb A1C 14.3. Started on IV Lasix 40 mg q 6 hours. Diuresed 4.2 liters over the last 24 hours. ECHO repeated 02/15/12 and EF reduced to <20%.  Chest XRay with evidence of pulmonary edema.    Denies SOB at rest. Remains dyspneic with exertion.    Review of Systems: [y] = yes, [ ]  = no   General: Weight gain [Y ]; Weight loss [ ] ; Anorexia [ ] ; Fatigue [ ] ; Fever [ ] ; Chills [ ] ; Weakness [Y ]  Cardiac: Chest pain/pressure [ ] ; Resting SOB [ ] ; Exertional SOB [Y ]; Orthopnea Jazmín.Cullens ]; Pedal Edema [Y ]; Palpitations [ ] ; Syncope [ ] ; Presyncope [ ] ; Paroxysmal nocturnal dyspnea[ ]   Pulmonary: Cough [ Y]; Wheezing[ ] ; Hemoptysis[ ] ; Sputum [ ] ; Snoring [ ]   GI: Vomiting[ ] ; Dysphagia[ ] ; Melena[ ] ; Hematochezia [ ] ; Heartburn[ ] ; Abdominal pain [ ] ; Constipation [ ] ; Diarrhea [ ] ; BRBPR [ ]   GU: Hematuria[ ] ; Dysuria [ ] ; Nocturia[ ]   Vascular:  Pain in legs with walking [ ] ; Pain in feet with lying flat [ ] ; Non-healing sores [ ] ; Stroke [ ] ; TIA [ ] ; Slurred speech [ ] ;  Neuro: Headaches[ ] ; Vertigo[ ] ; Seizures[ ] ; Paresthesias[ ] ;Blurred vision [ ] ; Diplopia [ ] ; Vision changes [ ]   Ortho/Skin: Arthritis [ ] ; Joint pain [ ] ; Muscle pain [ ] ; Joint swelling [ ] ; Back Pain [ ] ; Rash [Y ]  Psych: Depression[ ] ; Anxiety[ ]   Heme: Bleeding problems [ ] ; Clotting disorders [ ] ; Anemia [ ]   Endocrine: Diabetes [ Y]; Thyroid dysfunction[ ]   Home Medications Prior to Admission medications   Medication Sig Start Date End Date Taking? Authorizing Provider  carvedilol (COREG) 12.5 MG tablet Take 12.5 mg by mouth 2 (two) times daily with a meal.   Yes Historical Provider, MD  furosemide (LASIX) 40 MG tablet Take 40 mg by mouth daily.   Yes Historical Provider, MD  valsartan (DIOVAN) 40 MG tablet Take 40 mg by mouth daily.   Yes Historical Provider, MD    Past Medical History: Past Medical History  Diagnosis Date  . Hypertension   . CHF (congestive heart failure)   . Diabetes mellitus   . GERD (gastroesophageal reflux disease)     Past Surgical History: Past Surgical History  Procedure Date  . Ankle surgery  2003    plate and screws  . Hip pinning 1990    bilateral    Family History: Family History  Problem Relation Age of Onset  . Coronary artery disease Brother     Social History: History   Social History  . Marital Status: Single    Spouse Name: N/A    Number of Children: N/A  . Years of Education: N/A   Social History Main Topics  . Smoking status: Never Smoker   . Smokeless tobacco: Never Used  . Alcohol Use: No  . Drug Use: No  . Sexually Active: Yes    Birth Control/ Protection: Condom   Other Topics Concern  . None   Social History Narrative  . None    Allergies:  No Known Allergies  Objective:    Vital Signs:   Temp:  [97.5 F (36.4 C)-99.1 F (37.3 C)] 97.5 F (36.4 C) (08/29  0500) Pulse Rate:  [94-99] 99  (08/29 0755) Resp:  [18-20] 18  (08/29 0500) BP: (111-123)/(80-93) 123/93 mmHg (08/29 0755) SpO2:  [98 %-100 %] 98 % (08/29 0500) Weight:  [159.757 kg (352 lb 3.2 oz)] 159.757 kg (352 lb 3.2 oz) (08/29 0500) Last BM Date: 02/15/12  Weight change: Filed Weights   02/15/12 0426 02/16/12 0500  Weight: 161.8 kg (356 lb 11.3 oz) 159.757 kg (352 lb 3.2 oz)    Intake/Output:   Intake/Output Summary (Last 24 hours) at 02/16/12 1340 Last data filed at 02/16/12 1325  Gross per 24 hour  Intake    340 ml  Output   3050 ml  Net  -2710 ml     Physical Exam: General:  Well appearing. No resp difficulty HEENT: normal Neck: supple. JVP to jaw . Carotids 2+ bilat; no bruits. No lymphadenopathy or thryomegaly appreciated. Cor: PMI nondisplaced. Regular rate & rhythm. No rubs, gallops or murmurs. Lungs: clear Abdomen:obese,  nontender, distended. No hepatosplenomegaly. No bruits or masses. Good bowel sounds. Extremities: no cyanosis, clubbing, rash, R and LLE 3+edema extends to thighs Neuro: alert & orientedx3, cranial nerves grossly intact. moves all 4 extremities w/o difficulty. Affect pleasant  Telemetry: SR  Labs: Basic Metabolic Panel:  Lab [id number] 02/15/12 1710 02/15/12 0830 02/14/12 2155  NA 137 138 141 138  K 3.5 3.9 3.5 3.8  CL 99 100 103 100  CO2 30 31 31 30   GLUCOSE 242* 253* 136* 313*  BUN 16 16 14 14   CREATININE 1.07 1.07 0.92 0.90  CALCIUM 8.7 8.3* 8.5 --  MG -- -- 1.7 --  PHOS -- -- -- --    Liver Function Tests:  Lab 02/15/12 0830 02/14/12 2155  AST 24 27  ALT 23 28  ALKPHOS 107 130*  BILITOT 0.8 1.0  PROT 5.6* 6.6  ALBUMIN 2.1* 2.5*   No results found for this basename: LIPASE:5,AMYLASE:5 in the last 168 hours No results found for this basename: AMMONIA:3 in the last 168 hours  CBC:  Lab 02/15/12 1710 02/15/12 0830 02/14/12 2155  WBC 7.5 6.5 8.4  NEUTROABS -- 4.2 6.0  HGB 13.7 13.5 14.7  HCT 41.3 40.4 43.3  MCV  88.2 88.2 87.7  PLT 248 245 275    Cardiac Enzymes:  Lab 02/15/12 0830 02/14/12 2155  CKTOTAL 98 --  CKMB 1.8 --  CKMBINDEX -- --  TROPONINI <0.30 <0.30    BNP: BNP (last 3 results)  Basename 02/14/12 2155  PROBNP 2637.0*    CBG:  Lab 02/16/12 1108 02/16/12 0705 02/15/12 2131 02/15/12  1647 02/15/12 1118  GLUCAP 222* 235* 267* 227* 166*    Coagulation Studies:  Basename 02/15/12 1710  LABPROT 15.1  INR 1.17    Other results:   Imaging: Dg Chest 2 View  02/14/2012  *RADIOLOGY REPORT*  Clinical Data: Cough, shortness of breath.  CHEST - 2 VIEW  Comparison: 09/02/2009  Findings: Cardiomegaly.  Central vascular congestion.  Mild interstitial prominence and more confluent left lower lobe opacity. Small left pleural effusion not excluded.  No pneumothorax.  No acute osseous finding.  IMPRESSION: Cardiomegaly with central vascular congestion.  Pulmonary edema pattern.  More confluent left lower lobe opacity may represent a small effusion with atelectasis or pneumonia.   Original Report Authenticated By: Suanne Marker, M.D.       Medications:     Current Medications:    . aspirin EC  325 mg Oral Daily  . carvedilol  12.5 mg Oral BID WC  . enoxaparin (LOVENOX) injection  80 mg Subcutaneous Q24H  . furosemide  40 mg Intravenous Q6H  . glipiZIDE  2.5 mg Oral BID AC  . heparin      . insulin aspart  0-9 Units Subcutaneous TID WC  . irbesartan  75 mg Oral Daily  . lidocaine      . nitroGLYCERIN      . potassium chloride  20 mEq Oral Daily  . sodium chloride  3 mL Intravenous Q12H  . sodium chloride  3 mL Intravenous Q12H  . sodium chloride  3 mL Intravenous Q12H  . DISCONTD: carvedilol  12.5 mg Oral BID WC  . DISCONTD: carvedilol  18.75 mg Oral BID WC  . DISCONTD: metFORMIN  500 mg Oral BID WC     Infusions:      Assessment:  1. A/C systolic heart failure  EF <20% 2. HTN 3. Uncontrolled DM   Hgb A1C 14.3  4. Obesity 5.  Noncompliance   Plan/Discussion:   Mr Lindskog is 36 year old male admitted with massive volume overload in the setting of uncontrolled hypertension and medication noncompliance. Weight at least 30 pounds above baseline of 320 pounds. EF significantly reduced to < 20% from previous 30-35%. Continue to diurese with IV lasix. Agree with plant for [facility] today to further assess hemodynamics.   He seems to have poor insight related to his heart failure as evidenced by medication noncompliance. He is currently not a candidate for advanced therapies due to obesity and noncompliance.   Would benefit from diabetes coordinator consult.   Dr Haroldine Laws plans to review cath result with Dr Debara Pickett today.  Length of Stay: 1  CLEGG,AMY NP-C 2:21 PM  Patient seen and examined with Darrick Grinder, NP. We discussed all aspects of the encounter. I agree with the assessment and plan as stated above.   Very difficult case he is a young man with advanced biventricular HF and unfortunately very little insight into his HF. He currently has marked volume overload in the setting of non-compliance and probable low output. He also has poorly controlled DM2 with HGBa1c = 14. He is apprehensive about his cath but I stressed the fact that this was an important part of his work-up.  Currently not a candidate for advanced therapies due to body habitus, limited insight, noncompliance, poorly controlled DM2 and apparent RV dysfunction. We will await his cath to plan further treatment. Suspect he may need inotropic support.   We appreciate the consult and we will follow closely.  Tiarra Anastacio,MD 3:15 PM

## 2012-02-16 NOTE — Progress Notes (Signed)
Informed consent obtained.  Pt states physician explained procedure, asking questions, pt education given at this time.  Pt refusing to watch video states 'I will watch it in the morning"..  Talking on telephone with family throughout evening.

## 2012-02-17 DIAGNOSIS — E119 Type 2 diabetes mellitus without complications: Secondary | ICD-10-CM

## 2012-02-17 DIAGNOSIS — R579 Shock, unspecified: Secondary | ICD-10-CM | POA: Insufficient documentation

## 2012-02-17 DIAGNOSIS — R570 Cardiogenic shock: Secondary | ICD-10-CM | POA: Insufficient documentation

## 2012-02-17 LAB — GLUCOSE, CAPILLARY
Glucose-Capillary: 169 mg/dL — ABNORMAL HIGH (ref 70–99)
Glucose-Capillary: 202 mg/dL — ABNORMAL HIGH (ref 70–99)
Glucose-Capillary: 156 mg/dL — ABNORMAL HIGH (ref 70–99)

## 2012-02-17 LAB — CARBOXYHEMOGLOBIN
O2 Saturation: 64 %
Total hemoglobin: 13.8 g/dL (ref 13.5–18.0)

## 2012-02-17 LAB — BASIC METABOLIC PANEL
Calcium: 8.4 mg/dL (ref 8.4–10.5)
Potassium: 3.6 mEq/L (ref 3.5–5.1)

## 2012-02-17 MED ORDER — POTASSIUM CHLORIDE CRYS ER 20 MEQ PO TBCR
40.0000 meq | EXTENDED_RELEASE_TABLET | Freq: Two times a day (BID) | ORAL | Status: AC
  Administered 2012-02-17 (×2): 40 meq via ORAL
  Filled 2012-02-17 (×2): qty 2

## 2012-02-17 MED ORDER — ASPIRIN EC 81 MG PO TBEC
81.0000 mg | DELAYED_RELEASE_TABLET | Freq: Every day | ORAL | Status: DC
  Administered 2012-02-18 – 2012-02-24 (×7): 81 mg via ORAL
  Filled 2012-02-17 (×7): qty 1

## 2012-02-17 MED ORDER — GLIPIZIDE 5 MG PO TABS
5.0000 mg | ORAL_TABLET | Freq: Two times a day (BID) | ORAL | Status: DC
  Administered 2012-02-17 – 2012-02-24 (×14): 5 mg via ORAL
  Filled 2012-02-17 (×17): qty 1

## 2012-02-17 MED ORDER — POTASSIUM CHLORIDE CRYS ER 20 MEQ PO TBCR
40.0000 meq | EXTENDED_RELEASE_TABLET | Freq: Every day | ORAL | Status: DC
  Administered 2012-02-18 – 2012-02-23 (×6): 40 meq via ORAL
  Filled 2012-02-17 (×6): qty 2
  Filled 2012-02-17: qty 1
  Filled 2012-02-17: qty 2

## 2012-02-17 MED ORDER — HYDRALAZINE HCL 25 MG PO TABS
12.5000 mg | ORAL_TABLET | Freq: Three times a day (TID) | ORAL | Status: DC
  Administered 2012-02-17 – 2012-02-19 (×6): 12.5 mg via ORAL
  Filled 2012-02-17 (×10): qty 0.5

## 2012-02-17 MED ORDER — SODIUM CHLORIDE 0.9 % IV SOLN
INTRAVENOUS | Status: DC
  Administered 2012-02-17: 20:00:00 via INTRAVENOUS

## 2012-02-17 MED ORDER — LOSARTAN POTASSIUM 50 MG PO TABS
50.0000 mg | ORAL_TABLET | Freq: Every day | ORAL | Status: DC
  Administered 2012-02-17 – 2012-02-24 (×8): 50 mg via ORAL
  Filled 2012-02-17 (×8): qty 1

## 2012-02-17 MED ORDER — ISOSORBIDE MONONITRATE ER 30 MG PO TB24
30.0000 mg | ORAL_TABLET | Freq: Every day | ORAL | Status: DC
  Administered 2012-02-17 – 2012-02-20 (×4): 30 mg via ORAL
  Filled 2012-02-17 (×5): qty 1

## 2012-02-17 MED ORDER — SODIUM CHLORIDE 0.9 % IJ SOLN
10.0000 mL | INTRAMUSCULAR | Status: DC | PRN
  Administered 2012-02-21 – 2012-02-24 (×2): 10 mL

## 2012-02-17 NOTE — Progress Notes (Signed)
   CARE MANAGEMENT NOTE 02/17/2012  Patient:  Joshua Massey, Joshua Massey   Account Number:  [id number]  Date Initiated:  02/16/2012  Documentation initiated by:  GRAVES-BIGELOW,BRENDA  Subjective/Objective Assessment:   Pt admitted with CHF exacerbation. Has an extensive hx. Pt will benefit from [facility] RN for disease management and will need assistance with medications.     Action/Plan:   CM did sspeak to CSW Amy and she states he should be eligible for outpatient pharmacy medicaiton assistance via the CHF Program. CM will continue to monitor.   Anticipated DC Date:  02/20/2012   Anticipated DC Plan:  [place]  CM consult      [facility] Choice  HOME HEALTH   Choice offered to / List presented to:  C-1 Patient   DME arranged  VEST - LIFE VEST        HH arranged  HH-1 RN      [place].   Status of service:  In process, will continue to follow Medicare Important Message given?   (If response is "NO", the following Medicare IM given date fields will be blank) Date Medicare IM given:   Date Additional Medicare IM given:    Discharge Disposition:    Per UR Regulation:  Reviewed for med. necessity/level of care/duration of stay  If discussed at [place] of Stay Meetings, dates discussed:    Comments:  02/17/2012 1500 Spoke to pt and states he has insurance but could not locate his cared. Benefits completed. Pt has active coverage with [employer] of [place]. Spoke to [place] with [employer] and requested insurance info be faxed to [employer] to recheck approval for device. Faxed insurance info to [employer]. Jonnie Finner RN CCM Case Mgmt phone [phone]  02/17/2012 1130 Contacted Charna Archer, rep for [employer]. Pt scheduled d/c home with Life Vest. Need paperwork on shadow chart to be completed. Notified [facility] that pt will need [place] RN for possible d/c with IV Milrinone. Jonnie Finner RN CCM Case Mgmt phone [phone]

## 2012-02-17 NOTE — Progress Notes (Signed)
[facility]  Subjective: No Complaints  Objective: Vital signs in last 24 hours: Temp:  [97.5 F (36.4 C)-98.5 F (36.9 C)] 98.5 F (36.9 C) (08/30 0553) Pulse Rate:  [88-101] 101  (08/30 0553) Resp:  [20] 20  (08/29 2156) BP: (96-127)/(58-86) 115/77 mmHg (08/30 0553) SpO2:  [97 %-100 %] 97 % (08/30 0553) Weight:  YR:5226854.124 kg (348 lb 9.6 oz)] 158.124 kg (348 lb 9.6 oz) (08/30 0553) Last BM Date: 02/15/12  Intake/Output from previous day: 08/29 0701 - 08/30 0700 In: 360 [P.O.:360] Out: 2025 [Urine:2025] Intake/Output this shift:    Medications Current Facility-Administered Medications  Medication Dose Route Frequency Provider Last Rate Last Dose  . 0.9 %  sodium chloride infusion  1 mL/kg/hr Intravenous Continuous Pixie Casino, MD 159.8 mL/hr at 02/16/12 1717 1 mL/kg/hr at 02/16/12 1717  . acetaminophen (TYLENOL) tablet 650 mg  650 mg Oral Q6H PRN Rise Patience, MD       Or  . acetaminophen (TYLENOL) suppository 650 mg  650 mg Rectal Q6H PRN Rise Patience, MD      . acetaminophen (TYLENOL) tablet 650 mg  650 mg Oral Q4H PRN Pixie Casino, MD      . aspirin EC tablet 325 mg  325 mg Oral Daily Rise Patience, MD   325 mg at 02/16/12 1109  . carvedilol (COREG) tablet 3.125 mg  3.125 mg Oral BID WC Jolaine Artist, MD   3.125 mg at 02/17/12 [id number]  . digoxin (LANOXIN) tablet 0.25 mg  0.25 mg Oral Daily Jolaine Artist, MD   0.25 mg at 02/16/12 2109  . enoxaparin (LOVENOX) injection 80 mg  80 mg Subcutaneous Q24H Rise Patience, MD   80 mg at 02/17/12 [id number]  . fentaNYL (SUBLIMAZE) 0.05 MG/ML injection           . furosemide (LASIX) injection 80 mg  80 mg Intravenous BID Jolaine Artist, MD      . glipiZIDE (GLUCOTROL) tablet 2.5 mg  2.5 mg Oral BID AC Domenic Polite, MD   2.5 mg at 02/17/12 [id number]  . heparin 2-0.9 UNIT/ML-% infusion           . insulin aspart (novoLOG) injection 0-9 Units  0-9 Units Subcutaneous TID  WC Rise Patience, MD   2 Units at 02/17/12 0636  . insulin glargine (LANTUS) injection 10 Units  10 Units Subcutaneous QHS Domenic Polite, MD   10 Units at 02/16/12 2227  . irbesartan (AVAPRO) tablet 75 mg  75 mg Oral Daily Rise Patience, MD   75 mg at 02/16/12 1120  . lidocaine (XYLOCAINE) 1 % injection           . lidocaine (XYLOCAINE) 1 % injection           . midazolam (VERSED) 2 MG/2ML injection           . midazolam (VERSED) 2 MG/2ML injection           . milrinone (PRIMACOR) infusion 200 mcg/mL (0.2 mg/mL)  0.25 mcg/kg/min Intravenous Continuous Jolaine Artist, MD 12 mL/hr at 02/17/12 0558 0.25 mcg/kg/min at 02/17/12 0558  . nitroGLYCERIN (NTG ON-CALL) 0.2 mg/mL injection           . ondansetron (ZOFRAN) tablet 4 mg  4 mg Oral Q6H PRN Rise Patience, MD       Or  . ondansetron [facility]) injection 4 mg  4 mg Intravenous Q6H PRN Rise Patience,  MD      . ondansetron (ZOFRAN) injection 4 mg  4 mg Intravenous Q6H PRN Pixie Casino, MD      . potassium chloride SA (K-DUR,KLOR-CON) CR tablet 20 mEq  20 mEq Oral Daily Rise Patience, MD   20 mEq at 02/16/12 1121  . sodium chloride 0.9 % injection 3 mL  3 mL Intravenous Q12H Rise Patience, MD   3 mL at 02/16/12 1121  . sodium chloride 0.9 % injection 3 mL  3 mL Intravenous Q12H Rise Patience, MD   3 mL at 02/16/12 2106  . spironolactone (ALDACTONE) tablet 25 mg  25 mg Oral Daily Jolaine Artist, MD   25 mg at 02/16/12 2109  . verapamil (ISOPTIN) 2.5 MG/ML injection           . DISCONTD: 0.9 %  sodium chloride infusion  250 mL Intravenous PRN Pixie Casino, MD      . DISCONTD: carvedilol (COREG) tablet 12.5 mg  12.5 mg Oral BID WC Rise Patience, MD   12.5 mg at 02/16/12 0759  . DISCONTD: carvedilol (COREG) tablet 12.5 mg  12.5 mg Oral BID WC Pixie Casino, MD   12.5 mg at 02/16/12 1737  . DISCONTD: carvedilol (COREG) tablet 18.75 mg  18.75 mg Oral BID WC Tarri Fuller, PA      . DISCONTD:  furosemide (LASIX) injection 40 mg  40 mg Intravenous Q6H Rise Patience, MD   40 mg at 02/16/12 1738  . DISCONTD: metFORMIN (GLUCOPHAGE) tablet 500 mg  500 mg Oral BID WC Domenic Polite, MD   500 mg at 02/15/12 1526  . DISCONTD: sodium chloride 0.9 % injection 3 mL  3 mL Intravenous Q12H Pixie Casino, MD   3 mL at 02/16/12 1122  . DISCONTD: sodium chloride 0.9 % injection 3 mL  3 mL Intravenous PRN Pixie Casino, MD        PE: General appearance: alert, cooperative and no distress  Lungs: clear to auscultation bilaterally  Heart: regular rate and rhythm, S1, S2 normal, no murmur, click, rub or gallop  Abdomen: Nontender, +BS  Extremities: Tense Edema through thighs to lower back.  Pulses: 2+ and symmetric  Skin: warm and dry  Neurologic: Grossly normal Right groin non tender,  No apparent hematoma, however, he has anasarca.  Lab Results:   Basename 02/15/12 1710 02/15/12 0830 02/14/12 2155  WBC 7.5 6.5 8.4  HGB 13.7 13.5 14.7  HCT 41.3 40.4 43.3  PLT 248 245 275   BMET  Basename 02/16/12 0525 02/15/12 1710 02/15/12 0830  NA 137 138 141  K 3.5 3.9 3.5  CL 99 100 103  CO2 30 31 31   GLUCOSE 242* 253* 136*  BUN 16 16 14   CREATININE 1.07 1.07 0.92  CALCIUM 8.7 8.3* 8.5   PT/INR  Basename 02/15/12 1710  LABPROT 15.1  INR 1.17   Cholesterol  Basename 02/16/12 0525  CHOL 100    Studies/Results:  Left Heart Cath. Hemodynamics:  Central Aortic Pressure / Mean Aortic Pressure: 111/88  LV Pressure / LV End diastolic Pressure: 28  Right Heart Catheterization:  RA - 28  RV - 61/29  PA - 63/18 (31)  LV/RV - simultaneous tracings indicate diastolic equalization of pressures and a "square root sign"  FCO/CI - 4.28/1.59  TDCO/CI - 3.46/1.28  AO Sat - 94% (room air)  PA Sat - 49% (room air)  TPG - could not be calculated, however, likely low  given the high LVEDP (suggesting venous hypertension from left heart failure)  PVR = 11 wood units  Coronary  Angiographic Data:  Left Main: Angiographically normal, short vessel which bifurcates early into the LAD and LCx systems.  Left Anterior Descending (LAD): Smooth appearing vessel with few branches, extends to the apex. There is a tubular 50% mid-vessel stenosis just distal to the takeoff of a large D1 branch.  1st diagonal (D1): Large branch without angiographic stenosis.  Circumflex (LCx): Angiographically normal vessel, somewhat tortuous.  Right Coronary Artery: Dominant vessel, no significant stenosis. Impression:  1. Moderate tubular mid-LAD lesion, otherwise no significant obstructive CAD.  2. Markedly elevated left and right heart pressures with restrictive physiology and reduced cardiac output.  Plan:  1. Continued diuresis - may benefit from inotropic support.  2. 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 Consider cMRI early on.  3. Will likely need a LifeVest (if he can fit in it) as bridge to an AICD if his LVEF does not improve with medical therapy.  4. Will discuss further with the Advanced HF service.  The case and results was discussed with the patient  The case and results was not discussed with the patient's PCP.  The case and results was discussed with the patient's Cardiologist.  Time Spend Directly with Patient:  180 minutes  Pixie Casino, MD, [facility]  Attending Cardiologist  [facility] [place]  HILTY,Kenneth C  Assessment/Plan  Principal Problem:  *CHF (congestive heart failure) Active Problems:  NSVT (nonsustained ventricular tachycardia)  DM2 (diabetes mellitus, type 2)  Hypertension  Cardiomyopathy:  EF<20%  02/15/2012  Obesity  Acute on chronic systolic heart failure  Plan:  S/P L/R HC revealing moderate tubular mid-LAD lesion. Severely elevated L/R heart pressures.  No NSVT in the last 24Hours.  Will arrange for lifevest before DC.  2.4 L negative last 24 Hours.  Continue with current diuretic therapy.  SCr holding  steady.  He has a long way to go!     LOS: 2 days    HAGER, BRYAN 02/17/2012 8:21 AM  Patient seen and examined. Agree with assessment and plan. Agree with NICM assessment and need for lifevest. LV dysfunction is out of proportion to isolated LAD stenosis noted on yesterday's cath. Pt continues to have tense edema top lowere extremities. Will need continued titration of coreg, ARB, continued aldosterone blockade as outpatient while wearing life vest to allow time for potential LV fxn improvement.   Troy Sine, MD, [facility] 02/17/2012 4:24 PM

## 2012-02-17 NOTE — Progress Notes (Signed)
   CARE MANAGEMENT NOTE 02/17/2012  Patient:  Joshua Massey, Joshua Massey   Account Number:  [id number]  Date Initiated:  02/16/2012  Documentation initiated by:  GRAVES-BIGELOW,BRENDA  Subjective/Objective Assessment:   Pt admitted with CHF exacerbation. Has an extensive hx. Pt will benefit from [facility] RN for disease management and will need assistance with medications.     Action/Plan:   CM did sspeak to CSW Amy and she states he should be eligible for outpatient pharmacy medicaiton assistance via the CHF Program. CM will continue to monitor.   Anticipated DC Date:  02/20/2012   Anticipated DC Plan:  [place]  CM consult      [facility] Choice  HOME HEALTH   Choice offered to / List presented to:  C-1 Patient   DME arranged  VEST - LIFE VEST        HH arranged  HH-1 RN      [place].   Status of service:  In process, will continue to follow Medicare Important Message given?   (If response is "NO", the following Medicare IM given date fields will be blank) Date Medicare IM given:   Date Additional Medicare IM given:    Discharge Disposition:    Per UR Regulation:  Reviewed for med. necessity/level of care/duration of stay  If discussed at [place] of Stay Meetings, dates discussed:    Comments:  02/17/2012 [place], rep for [place]. Pt scheduled d/c home with Life Vest. Need paperwork on shadow chart to be completed. Notified [facility] that pt will need [place] RN for possible d/c with IV Milrinone. Jonnie Finner RN CCM Case Mgmt phone [phone]

## 2012-02-17 NOTE — Progress Notes (Addendum)
Advanced Heart Failure Rounding Note   Subjective:   [name] is a 36 year old AA with a history of DM, HTN, and chronic systolic heart failure (diagnosed in 2010 EF 30-35%). He has been followed by Dr Rollene Fare for HTN and heart failure but he has not been seen since 05/2010.   ECHO 02/15/12 EF <20%  02/16/12 RHC/LHC RA - 28  RV - 61/29  PA - 63/18 (31) FCO/CI - 4.28/1.59  TDCO/CI - 3.46/1.28  AO Sat - 94% (room air)  PA Sat - 49% (room air)  LVEDP 28 PVR = 0.8 Woods Moderate tubular mid-LAD lesion, otherwise no significant obstructive CAD.  Started on Milrinone post catherization. 24 hour I/O  -1.6 liters. Weight down 4 pounds.   Wants to know when he can go home. Breathing better. Denies SOB/PND/Orthopnea.       Objective:   Weight Range:  Vital Signs:   Temp:  [97.5 F (36.4 C)-98.5 F (36.9 C)] 98.5 F (36.9 C) (08/30 0553) Pulse Rate:  [88-101] 101  (08/30 0553) Resp:  [20] 20  (08/29 2156) BP: (96-127)/(58-86) 115/77 mmHg (08/30 0553) SpO2:  [97 %-100 %] 97 % (08/30 0553) Weight:  [id number] kg (348 lb 9.6 oz)] 158.124 kg (348 lb 9.6 oz) (08/30 0553) Last BM Date: 02/15/12  Weight change: Filed Weights   02/15/12 0426 02/16/12 0500 02/17/12 0553  Weight: 161.8 kg (356 lb 11.3 oz) 159.757 kg (352 lb 3.2 oz) 158.124 kg (348 lb 9.6 oz)    Intake/Output:   Intake/Output Summary (Last 24 hours) at 02/17/12 0811 Last data filed at 02/17/12 0557  Gross per 24 hour  Intake    360 ml  Output   2025 ml  Net  -1665 ml     Physical Exam: General: Well appearing. No resp difficulty  HEENT: normal  Neck: supple. JVP to jaw . Carotids 2+ bilat; no bruits. No lymphadenopathy or thryomegaly appreciated.  Cor: PMI nondisplaced. Regular rate & rhythm. No rubs, gallops or murmurs.  Lungs: clear  Abdomen:obese, nontender, distended. No hepatosplenomegaly. No bruits or masses. Good bowel sounds.  Extremities: no cyanosis, clubbing, rash, R and LLE 3+edema extends to  thighs  Neuro: alert & orientedx3, cranial nerves grossly intact. moves all 4 extremities w/o difficulty. Affect pleasant  Telemetry: SR  Labs: Basic Metabolic Panel:  Lab [id number] 02/15/12 1710 02/15/12 0830 02/14/12 2155  NA 137 138 141 138  K 3.5 3.9 3.5 3.8  CL 99 100 103 100  CO2 30 31 31 30   GLUCOSE 242* 253* 136* 313*  BUN 16 16 14 14   CREATININE 1.07 1.07 0.92 0.90  CALCIUM 8.7 8.3* 8.5 --  MG -- -- 1.7 --  PHOS -- -- -- --    Liver Function Tests:  Lab 02/15/12 0830 02/14/12 2155  AST 24 27  ALT 23 28  ALKPHOS 107 130*  BILITOT 0.8 1.0  PROT 5.6* 6.6  ALBUMIN 2.1* 2.5*   No results found for this basename: LIPASE:5,AMYLASE:5 in the last 168 hours No results found for this basename: AMMONIA:3 in the last 168 hours  CBC:  Lab 02/15/12 1710 02/15/12 0830 02/14/12 2155  WBC 7.5 6.5 8.4  NEUTROABS -- 4.2 6.0  HGB 13.7 13.5 14.7  HCT 41.3 40.4 43.3  MCV 88.2 88.2 87.7  PLT 248 245 275    Cardiac Enzymes:  Lab 02/15/12 0830 02/14/12 2155  CKTOTAL 98 --  CKMB 1.8 --  CKMBINDEX -- --  TROPONINI <0.30 <0.30  BNP: BNP (last 3 results)  Basename 02/14/12 2155  PROBNP 2637.0*     Other results:  Imaging:  No results found.   Medications:     Scheduled Medications:    . aspirin EC  325 mg Oral Daily  . carvedilol  3.125 mg Oral BID WC  . digoxin  0.25 mg Oral Daily  . enoxaparin (LOVENOX) injection  80 mg Subcutaneous Q24H  . fentaNYL      . furosemide  80 mg Intravenous BID  . glipiZIDE  2.5 mg Oral BID AC  . heparin      . insulin aspart  0-9 Units Subcutaneous TID WC  . insulin glargine  10 Units Subcutaneous QHS  . irbesartan  75 mg Oral Daily  . lidocaine      . lidocaine      . midazolam      . midazolam      . nitroGLYCERIN      . potassium chloride  20 mEq Oral Daily  . sodium chloride  3 mL Intravenous Q12H  . sodium chloride  3 mL Intravenous Q12H  . spironolactone  25 mg Oral Daily  . verapamil      .  DISCONTD: carvedilol  12.5 mg Oral BID WC  . DISCONTD: carvedilol  12.5 mg Oral BID WC  . DISCONTD: carvedilol  18.75 mg Oral BID WC  . DISCONTD: furosemide  40 mg Intravenous Q6H  . DISCONTD: metFORMIN  500 mg Oral BID WC  . DISCONTD: sodium chloride  3 mL Intravenous Q12H     Infusions:    . sodium chloride 1 mL/kg/hr (02/16/12 1717)  . milrinone 0.25 mcg/kg/min (02/17/12 0558)     PRN Medications:  acetaminophen, acetaminophen, acetaminophen, ondansetron (ZOFRAN) IV, ondansetron (ZOFRAN) IV, ondansetron, DISCONTD: sodium chloride, DISCONTD: sodium chloride   Assessment:   1. A/C systolic heart failure EF <20%  2. HTN  3. Uncontrolled DM Hgb A1C 14.3  4. Obesity  5. Noncompliance   Plan/Discussion:    Reviewed cath results. Low output heart failure with significant volume overload. Weight at least 20 pounds above baseline.  Agree with inotropes and IV Lasix. Will need to consider PICC if unable to wean Milrinone once diuresed. Replace potassium.   Currently not a candidate for advanced therapies due to body habitus, limited insight, noncompliance, poorly controlled DM2 and apparent RV dysfunction.  He will need close follow up for ongoing HF management and DM compliance.    Length of Stay: 2 CLEGG,AMY NP-C 02/17/2012, 8:11 AM  Patient seen and examined with Darrick Grinder, NP. We discussed all aspects of the encounter. I agree with the assessment and plan as stated above.   Cath results reviewed carefully. Wedge up. CI down. Milrinone started. Urine output much improved but still with marked volume overload.   Will transfer to 2900 stepdown for monitoring of CVPs and co-ox. Change irbesartan to losartan. Add low-dose hydralazine and nitrates. Hopefully will be able to wean off milrinone prior to d/c.  Long talk with him about seriousness of his condition but his insight into this appears limited.   Currently not a candidate for advanced therapies due to body habitus,  limited insight, noncompliance, poorly controlled DM2 and RV dysfunction.  Will likely need to be in the hospital for at least another 5 days.  Daniel Bensimhon,MD 4:40 PM

## 2012-02-17 NOTE — Progress Notes (Signed)
Pt being transferred to 2900.  Report called to receiving RN.  Nurse denies any questions or concerns at this time.

## 2012-02-17 NOTE — Progress Notes (Signed)
DOing well, no specific complaints For DM: CBGs better Not too keen on taking insulin at home Continue lantus QHS at bedtime Increase Glipizide to BID Add metformin at Joshua Massey, [place]

## 2012-02-18 LAB — BASIC METABOLIC PANEL
Calcium: 8.2 mg/dL — ABNORMAL LOW (ref 8.4–10.5)
Creatinine, Ser: 0.84 mg/dL (ref 0.50–1.35)
GFR calc Af Amer: >90 mL/min (ref >90)
GFR calc non Af Amer: >90 mL/min (ref >90)
Sodium: 138 mEq/L (ref 135–145)

## 2012-02-18 LAB — GLUCOSE, CAPILLARY: Glucose-Capillary: 148 mg/dL — ABNORMAL HIGH (ref 70–99)

## 2012-02-18 LAB — CARBOXYHEMOGLOBIN: Methemoglobin: 0.9 % (ref 0.0–1.5)

## 2012-02-18 MED ORDER — POTASSIUM CHLORIDE 20 MEQ PO PACK: 40.0000 meq | PACK | Freq: Once | ORAL | Status: DC

## 2012-02-18 MED ORDER — FUROSEMIDE 10 MG/ML IJ SOLN
40.0000 mg | Freq: Once | INTRAMUSCULAR | Status: AC
  Administered 2012-02-18: 40 mg via INTRAVENOUS

## 2012-02-18 NOTE — Progress Notes (Signed)
[facility]  DAILY PROGRESS NOTE   Subjective:  Diuresing signifcantly (-5L yesterday), now on milrinone. Breathing is improved, but he remains anasarcic.  Objective:  Temp:  [97.4 F (36.3 C)-99 F (37.2 C)] 97.4 F (36.3 C) (08/31 0308) Pulse Rate:  [98-107] 99  (08/31 0500) Resp:  [16-20] 16  (08/31 0600) BP: (105-141)/(49-95) 115/59 mmHg (08/31 0600) SpO2:  [92 %-98 %] 96 % (08/31 0600) Weight:  [154.5 kg (340 lb 9.8 oz)] 154.5 kg (340 lb 9.8 oz) (08/31 0500) Weight change: -3.624 kg (-7 lb 15.8 oz)  Intake/Output from previous day: 08/30 0701 - 08/31 0700 In: 1672.4 [P.O.:1195; I.V.:477.4] Out: 7325 [Urine:7325]  Intake/Output from this shift:    Medications: Current Facility-Administered Medications  Medication Dose Route Frequency Provider Last Rate Last Dose  . 0.9 %  sodium chloride infusion   Intravenous Continuous Jolaine Artist, MD 10 mL/hr at 02/17/12 1949    . acetaminophen (TYLENOL) tablet 650 mg  650 mg Oral Q6H PRN Rise Patience, MD       Or  . acetaminophen (TYLENOL) suppository 650 mg  650 mg Rectal Q6H PRN Rise Patience, MD      . acetaminophen (TYLENOL) tablet 650 mg  650 mg Oral Q4H PRN Pixie Casino, MD   650 mg at 02/18/12 0040  . aspirin EC tablet 81 mg  81 mg Oral Daily Shaune Pascal Bensimhon, MD      . carvedilol (COREG) tablet 3.125 mg  3.125 mg Oral BID WC Jolaine Artist, MD   3.125 mg at 02/17/12 1707  . digoxin (LANOXIN) tablet 0.25 mg  0.25 mg Oral Daily Jolaine Artist, MD   0.25 mg at 02/17/12 1014  . enoxaparin (LOVENOX) injection 80 mg  80 mg Subcutaneous Q24H Rise Patience, MD   80 mg at 02/18/12 0526  . furosemide (LASIX) injection 80 mg  80 mg Intravenous BID Jolaine Artist, MD   80 mg at 02/17/12 1730  . glipiZIDE (GLUCOTROL) tablet 5 mg  5 mg Oral BID AC Domenic Polite, MD   5 mg at 02/17/12 1707  . hydrALAZINE (APRESOLINE) tablet 12.5 mg  12.5 mg Oral Q8H Jolaine Artist, MD   12.5 mg at 02/18/12 0527  . insulin aspart (novoLOG) injection 0-9 Units  0-9 Units Subcutaneous TID WC Rise Patience, MD   2 Units at 02/17/12 1708  . insulin glargine (LANTUS) injection 10 Units  10 Units Subcutaneous QHS Domenic Polite, MD   10 Units at 02/17/12 2230  . isosorbide mononitrate (IMDUR) 24 hr tablet 30 mg  30 mg Oral Daily Jolaine Artist, MD   30 mg at 02/17/12 1729  . losartan (COZAAR) tablet 50 mg  50 mg Oral Daily Jolaine Artist, MD   50 mg at 02/17/12 1729  . milrinone (PRIMACOR) infusion 200 mcg/mL (0.2 mg/mL)  0.25 mcg/kg/min Intravenous Continuous Jolaine Artist, MD 12 mL/hr at 02/18/12 0534 0.25 mcg/kg/min at 02/18/12 0534  . ondansetron (ZOFRAN) tablet 4 mg  4 mg Oral Q6H PRN Rise Patience, MD       Or  . ondansetron [facility]) injection 4 mg  4 mg Intravenous Q6H PRN Rise Patience, MD      . ondansetron [facility]) injection 4 mg  4 mg Intravenous Q6H PRN Pixie Casino, MD      . potassium chloride SA (K-DUR,KLOR-CON) CR tablet 40 mEq  40 mEq Oral BID Amy D Clegg,  NP   40 mEq at 02/17/12 2257  . potassium chloride SA (K-DUR,KLOR-CON) CR tablet 40 mEq  40 mEq Oral Daily Amy D Clegg, NP      . sodium chloride 0.9 % injection 10-40 mL  10-40 mL Intracatheter PRN Pixie Casino, MD      . sodium chloride 0.9 % injection 3 mL  3 mL Intravenous Q12H Rise Patience, MD   3 mL at 02/17/12 2230  . sodium chloride 0.9 % injection 3 mL  3 mL Intravenous Q12H Rise Patience, MD   3 mL at 02/17/12 1024  . spironolactone (ALDACTONE) tablet 25 mg  25 mg Oral Daily Jolaine Artist, MD   25 mg at 02/17/12 1014  . DISCONTD: aspirin EC tablet 325 mg  325 mg Oral Daily Rise Patience, MD   325 mg at 02/17/12 1014  . DISCONTD: glipiZIDE (GLUCOTROL) tablet 2.5 mg  2.5 mg Oral BID AC Domenic Polite, MD   2.5 mg at 02/17/12 [id number]  . DISCONTD: irbesartan (AVAPRO) tablet 75 mg  75 mg Oral Daily Rise Patience, MD   75 mg at  02/17/12 1014  . DISCONTD: potassium chloride SA (K-DUR,KLOR-CON) CR tablet 20 mEq  20 mEq Oral Daily Rise Patience, MD   20 mEq at 02/17/12 1014    Physical Exam: General appearance: alert and no distress Neck: no adenopathy, no carotid bruit, supple, symmetrical, trachea midline, thyroid not enlarged, symmetric, no tenderness/mass/nodules and JVP is difficult to assess due to neck size Lungs: diminished breath sounds bilaterally Heart: regular rate and rhythm, S1, S2 normal, no murmur, click, rub or gallop Abdomen: soft, non-tender; bowel sounds normal; no masses,  no organomegaly Extremities: anasarcic, scrotal edema Pulses: 2+ and symmetric Skin: Skin color, texture, turgor normal. No rashes or lesions Neurologic: Grossly normal  Lab Results: Results for orders placed during the hospital encounter of 02/15/12 (from the past 48 hour(s))  GLUCOSE, CAPILLARY     Status: Abnormal   Collection Time   02/16/12 11:08 AM      Component Value Range Comment   Glucose-Capillary 222 (*) 70 - 99 mg/dL    Comment 1 Notify RN     POCT I-STAT 3, BLOOD GAS (G3P V)     Status: Abnormal   Collection Time   02/16/12  3:42 PM      Component Value Range Comment   pH, Ven 7.440 (*) 7.250 - 7.300    pCO2, Ven 43.2 (*) 45.0 - 50.0 mmHg    pO2, Ven 26.0 (*) 30.0 - 45.0 mmHg    Bicarbonate 29.4 (*) 20.0 - 24.0 mEq/L    TCO2 31  0 - 100 mmol/L    O2 Saturation 49.0      Acid-Base Excess 5.0 (*) 0.0 - 2.0 mmol/L    Sample type VENOUS      Comment NOTIFIED PHYSICIAN     POCT I-STAT 3, BLOOD GAS (G3+)     Status: Abnormal   Collection Time   02/16/12  3:51 PM      Component Value Range Comment   pH, Arterial 7.509 (*) 7.350 - 7.450    pCO2 arterial 34.4 (*) 35.0 - 45.0 mmHg    pO2, Arterial 62.0 (*) 80.0 - 100.0 mmHg    Bicarbonate 27.4 (*) 20.0 - 24.0 mEq/L    TCO2 28  0 - 100 mmol/L    O2 Saturation 94.0      Acid-Base Excess 5.0 (*) 0.0 - 2.0 mmol/L  Sample type ARTERIAL     GLUCOSE,  CAPILLARY     Status: Abnormal   Collection Time   02/16/12  4:17 PM      Component Value Range Comment   Glucose-Capillary 190 (*) 70 - 99 mg/dL   GLUCOSE, CAPILLARY     Status: Abnormal   Collection Time   02/16/12  5:05 PM      Component Value Range Comment   Glucose-Capillary 164 (*) 70 - 99 mg/dL    Comment 1 Notify RN     GLUCOSE, CAPILLARY     Status: Abnormal   Collection Time   02/16/12  9:56 PM      Component Value Range Comment   Glucose-Capillary 191 (*) 70 - 99 mg/dL    Comment 1 Documented in Chart      Comment 2 Notify RN     GLUCOSE, CAPILLARY     Status: Abnormal   Collection Time   02/17/12  6:33 AM      Component Value Range Comment   Glucose-Capillary 169 (*) 70 - 99 mg/dL   BASIC METABOLIC PANEL     Status: Abnormal   Collection Time   02/17/12  9:02 AM      Component Value Range Comment   Sodium 139  135 - 145 mEq/L    Potassium 3.6  3.5 - 5.1 mEq/L    Chloride 102  96 - 112 mEq/L    CO2 27  19 - 32 mEq/L    Glucose, Bld 164 (*) 70 - 99 mg/dL    BUN 13  6 - 23 mg/dL    Creatinine, Ser 0.92  0.50 - 1.35 mg/dL    Calcium 8.4  8.4 - 10.5 mg/dL    GFR calc non Af Amer >90  >90 mL/min    GFR calc Af Amer >90  >90 mL/min   GLUCOSE, CAPILLARY     Status: Abnormal   Collection Time   02/17/12 11:16 AM      Component Value Range Comment   Glucose-Capillary 202 (*) 70 - 99 mg/dL    Comment 1 Notify RN     GLUCOSE, CAPILLARY     Status: Abnormal   Collection Time   02/17/12  4:10 PM      Component Value Range Comment   Glucose-Capillary 156 (*) 70 - 99 mg/dL    Comment 1 Notify RN     GLUCOSE, CAPILLARY     Status: Abnormal   Collection Time   02/17/12  9:21 PM      Component Value Range Comment   Glucose-Capillary 181 (*) 70 - 99 mg/dL   CARBOXYHEMOGLOBIN     Status: Abnormal   Collection Time   02/17/12 10:50 PM      Component Value Range Comment   Total hemoglobin 13.8  13.5 - 18.0 g/dL    O2 Saturation 64.0      Carboxyhemoglobin 1.6 (*) 0.5 - 1.5 %      Methemoglobin 1.0  0.0 - 1.5 %   BASIC METABOLIC PANEL     Status: Abnormal   Collection Time   02/18/12  5:00 AM      Component Value Range Comment   Sodium 138  135 - 145 mEq/L    Potassium 3.6  3.5 - 5.1 mEq/L    Chloride 104  96 - 112 mEq/L    CO2 29  19 - 32 mEq/L    Glucose, Bld 199 (*) 70 - 99 mg/dL  BUN 11  6 - 23 mg/dL    Creatinine, Ser 0.84  0.50 - 1.35 mg/dL    Calcium 8.2 (*) 8.4 - 10.5 mg/dL    GFR calc non Af Amer >90  >90 mL/min    GFR calc Af Amer >90  >90 mL/min   CARBOXYHEMOGLOBIN     Status: Abnormal   Collection Time   02/18/12  5:00 AM      Component Value Range Comment   Total hemoglobin 12.0 (*) 13.5 - 18.0 g/dL    O2 Saturation 71.1      Carboxyhemoglobin 1.7 (*) 0.5 - 1.5 %    Methemoglobin 0.9  0.0 - 1.5 %     Imaging: No results found.  Assessment:  1. Principal Problem: 2.  *CHF (congestive heart failure) 3. Active Problems: 4.  DM2 (diabetes mellitus, type 2) 5.  Hypertension 6.  Cardiomyopathy:  EF<20%  02/15/2012 7.  NSVT (nonsustained ventricular tachycardia) 8.  Obesity 9.  Acute on chronic systolic heart failure 10.  Cardiogenic shock 11.   Plan:  1. Diuresing exceptionally well on milrinone. Will need to watch for contraction alkalosis. However as long as his kidneys continue to tolerate this rate of diuresis, I would continue current dose lasix.  Appreciate advanced HF service involvement. ? Benefit of cMRI if he is not a candidate for transplant or VAD.  Time Spent Directly with Patient:  15 minutes  Length of Stay:  LOS: 3 days   Pixie Casino, MD, [facility] Attending Cardiologist [facility]  HILTY,Kenneth C 02/18/2012, 8:12 AM

## 2012-02-18 NOTE — Progress Notes (Signed)
Patient sleeping-O2 sats dropped 88-92% on RA.  Patient awakened, denies SOB-O2 Sats remain 92%.  No change in physical assessment, lung remain essentially clear, decreased in bases bilaterally.  Placed on O2 at 2L/M with O2 sats increased to 95-98%.  Continue to monitor.

## 2012-02-18 NOTE — Progress Notes (Signed)
Dr. Fransico Him notified of CVP 16, HR 116, and patient now has bibasilar crackles 1/3 way up posteriorly.  Patient denies SOB but O2 Sats down to 92-94%.  Orders rec'd.  Emotional support to patient along with explanations for care.

## 2012-02-18 NOTE — Progress Notes (Signed)
Patient ID: DANNY CELIS, male   DOB: [birth year], 36 y.o.   MRN: [id number] Advanced Heart Failure Rounding Note   Subjective:   Mr Mazzarella is a 36 year old AA with a history of DM, HTN, and chronic systolic heart failure (diagnosed in 2010 EF 30-35%). He has been followed by Dr Rollene Fare for HTN and heart failure but he has not been seen since 05/2010.   ECHO 02/15/12 EF <20%  02/16/12 RHC/LHC RA - 28  RV - 61/29  PA - 63/18 (31) FCO/CI - 4.28/1.59  TDCO/CI - 3.46/1.28  AO Sat - 94% (room air)  PA Sat - 49% (room air)  LVEDP 28 PVR = 0.8 [place] Moderate tubular mid-LAD lesion, otherwise no significant obstructive CAD.  Started on Milrinone post catherization. 24 hour I/O  -5.6 liters. Weight down 8 pounds.   Breathing better, no orthopnea.  Renal fxn stable, co-ox 71%, CVP about 10.      Objective:   Weight Range:  Vital Signs:   Temp:  [97.4 F (36.3 C)-99 F (37.2 C)] 97.4 F (36.3 C) (08/31 0308) Pulse Rate:  [98-107] 99  (08/31 0500) Resp:  [16-20] 16  (08/31 0600) BP: (105-141)/(49-95) 115/59 mmHg (08/31 0600) SpO2:  [92 %-98 %] 96 % (08/31 0600) Weight:  [340 lb 9.8 oz (154.5 kg)] 340 lb 9.8 oz (154.5 kg) (08/31 0500) Last BM Date: 02/15/12  Weight change: Filed Weights   02/16/12 0500 02/17/12 0553 02/18/12 0500  Weight: 352 lb 3.2 oz (159.757 kg) 348 lb 9.6 oz (158.124 kg) 340 lb 9.8 oz (154.5 kg)    Intake/Output:   Intake/Output Summary (Last 24 hours) at 02/18/12 0813 Last data filed at 02/18/12 0500  Gross per 24 hour  Intake 1672.43 ml  Output   7325 ml  Net -5652.57 ml     Physical Exam: General: Well appearing. No resp difficulty  HEENT: normal  Neck: supple. JVP 14-16 cm. Carotids 2+ bilat; no bruits. No lymphadenopathy or thryomegaly appreciated.  Cor: PMI nondisplaced. Regular rate & rhythm. No rubs, gallops or murmurs.  Lungs: clear  Abdomen:obese, nontender, distended. No hepatosplenomegaly. No bruits or masses. Good bowel sounds.   Extremities: no cyanosis, clubbing, rash, R and LLE 2+edema extends to thighs  Neuro: alert & orientedx3, cranial nerves grossly intact. moves all 4 extremities w/o difficulty. Affect pleasant  Telemetry: SR  Labs: Basic Metabolic Panel:  Lab [id number] 0500 02/17/12 0902 02/16/12 0525 02/15/12 1710 02/15/12 0830  NA 138 139 137 138 141  K 3.6 3.6 3.5 3.9 3.5  CL 104 102 99 100 103  CO2 29 27 30 31 31   GLUCOSE 199* 164* 242* 253* 136*  BUN 11 13 16 16 14   CREATININE 0.84 0.92 1.07 1.07 0.92  CALCIUM 8.2* 8.4 8.7 -- --  MG -- -- -- -- 1.7  PHOS -- -- -- -- --    Liver Function Tests:  Lab 02/15/12 0830 02/14/12 2155  AST 24 27  ALT 23 28  ALKPHOS 107 130*  BILITOT 0.8 1.0  PROT 5.6* 6.6  ALBUMIN 2.1* 2.5*   No results found for this basename: LIPASE:5,AMYLASE:5 in the last 168 hours No results found for this basename: AMMONIA:3 in the last 168 hours  CBC:  Lab 02/15/12 1710 02/15/12 0830 02/14/12 2155  WBC 7.5 6.5 8.4  NEUTROABS -- 4.2 6.0  HGB 13.7 13.5 14.7  HCT 41.3 40.4 43.3  MCV 88.2 88.2 87.7  PLT 248 245 275    Cardiac Enzymes:  Lab 02/15/12 0830 02/14/12 2155  CKTOTAL 98 --  CKMB 1.8 --  CKMBINDEX -- --  TROPONINI <0.30 <0.30    BNP: BNP (last 3 results)  Basename 02/14/12 2155  PROBNP 2637.0*     Other results:  Imaging: No results found.   Medications:     Scheduled Medications:    . aspirin EC  81 mg Oral Daily  . carvedilol  3.125 mg Oral BID WC  . digoxin  0.25 mg Oral Daily  . enoxaparin (LOVENOX) injection  80 mg Subcutaneous Q24H  . furosemide  80 mg Intravenous BID  . glipiZIDE  5 mg Oral BID AC  . hydrALAZINE  12.5 mg Oral Q8H  . insulin aspart  0-9 Units Subcutaneous TID WC  . insulin glargine  10 Units Subcutaneous QHS  . isosorbide mononitrate  30 mg Oral Daily  . losartan  50 mg Oral Daily  . potassium chloride  40 mEq Oral BID  . potassium chloride  40 mEq Oral Daily  . sodium chloride  3 mL Intravenous Q12H    . sodium chloride  3 mL Intravenous Q12H  . spironolactone  25 mg Oral Daily  . DISCONTD: aspirin EC  325 mg Oral Daily  . DISCONTD: glipiZIDE  2.5 mg Oral BID AC  . DISCONTD: irbesartan  75 mg Oral Daily  . DISCONTD: potassium chloride  20 mEq Oral Daily    Infusions:    . sodium chloride 10 mL/hr at 02/17/12 1949  . milrinone 0.25 mcg/kg/min (02/18/12 0534)    PRN Medications: acetaminophen, acetaminophen, acetaminophen, ondansetron (ZOFRAN) IV, ondansetron (ZOFRAN) IV, ondansetron, sodium chloride   Assessment:   1. A/C systolic heart failure EF <20%, nonischemic cardiomyopathy 2. HTN  3. Uncontrolled DM Hgb A1C 14.3  4. Obesity  5. Noncompliance   Plan/Discussion:   Patient is still markedly volume overloaded by exam.  He has diuresed well with IV Lasix and milrinone, weight is down.  Renal fxn stable.  Good co-ox on milrinone 0.25.  Will likely need 3-4 more days of IV diuresis. No change to cardiac med regimen today: would keep Coreg at current level, digoxin, hydral/Imdur, losartan, and spironolactone.  Will titrate up afterload reduction further when we begin to taper milrinone.  Replete K.  Will need to check digoxin level prior to discharge.   Currently not a candidate for advanced therapies due to body habitus, limited insight, noncompliance, poorly controlled DM2 and RV dysfunction.  Will likely need to be in the hospital for at least another 5 days.  Delray Reza,MD 8:13 AM

## 2012-02-18 NOTE — Progress Notes (Signed)
For DM: CBGs better, mostly <180 Not keen on taking insulin at home Continue lantus QHS at bedtime Continue Glipizide at current dose Add metformin at [place]

## 2012-02-19 LAB — BASIC METABOLIC PANEL
BUN: 9 mg/dL (ref 6–23)
Chloride: 101 mEq/L (ref 96–112)
GFR calc Af Amer: >90 mL/min (ref >90)
GFR calc non Af Amer: >90 mL/min (ref >90)
Glucose, Bld: 198 mg/dL — ABNORMAL HIGH (ref 70–99)
Potassium: 3.7 mEq/L (ref 3.5–5.1)
Sodium: 136 mEq/L (ref 135–145)

## 2012-02-19 LAB — GLUCOSE, CAPILLARY
Glucose-Capillary: 142 mg/dL — ABNORMAL HIGH (ref 70–99)
Glucose-Capillary: 194 mg/dL — ABNORMAL HIGH (ref 70–99)

## 2012-02-19 MED ORDER — POTASSIUM CHLORIDE 20 MEQ PO PACK
20.0000 meq | PACK | Freq: Once | ORAL | Status: AC
  Administered 2012-02-19: 20 meq via ORAL
  Filled 2012-02-19: qty 1

## 2012-02-19 MED ORDER — HYDRALAZINE HCL 25 MG PO TABS
25.0000 mg | ORAL_TABLET | Freq: Three times a day (TID) | ORAL | Status: DC
  Administered 2012-02-19 – 2012-02-20 (×3): 25 mg via ORAL
  Filled 2012-02-19 (×7): qty 1

## 2012-02-19 NOTE — Progress Notes (Signed)
[facility]  DAILY PROGRESS NOTE   Subjective:  Continues excellent diuresis on milrinone. Maintaining co-ox of 72%. He put out another 6L last night .Marland Kitchen Renal function is remaining stable. There is still significant volume to remove.  Objective:  Temp:  [97.6 F (36.4 C)-99.1 F (37.3 C)] 97.7 F (36.5 C) (09/01 0736) Resp:  [14-20] 14  (09/01 0806) BP: (109-133)/(54-85) 109/67 mmHg (09/01 0806) SpO2:  [94 %-96 %] 95 % (09/01 0806) Weight:  [149.5 kg (329 lb 9.4 oz)] 149.5 kg (329 lb 9.4 oz) (09/01 0432) Weight change: -5 kg (-11 lb 0.4 oz)  Intake/Output from previous day: 08/31 0701 - 09/01 0700 In: 2335 [P.O.:1775; I.V.:528; IV Piggyback:32] Out: [id number] [Urine:9025]  Intake/Output from this shift: Total I/O In: 22 [I.V.:22] Out: -   Medications: Current Facility-Administered Medications  Medication Dose Route Frequency Provider Last Rate Last Dose  . 0.9 %  sodium chloride infusion   Intravenous Continuous Jolaine Artist, MD 10 mL/hr at 02/17/12 1949    . acetaminophen (TYLENOL) tablet 650 mg  650 mg Oral Q6H PRN Rise Patience, MD       Or  . acetaminophen (TYLENOL) suppository 650 mg  650 mg Rectal Q6H PRN Rise Patience, MD      . acetaminophen (TYLENOL) tablet 650 mg  650 mg Oral Q4H PRN Pixie Casino, MD   650 mg at 02/19/12 0437  . aspirin EC tablet 81 mg  81 mg Oral Daily Jolaine Artist, MD   81 mg at 02/18/12 [id number]  . carvedilol (COREG) tablet 3.125 mg  3.125 mg Oral BID WC Jolaine Artist, MD   3.125 mg at 02/18/12 1652  . digoxin (LANOXIN) tablet 0.25 mg  0.25 mg Oral Daily Jolaine Artist, MD   0.25 mg at 02/18/12 0842  . enoxaparin (LOVENOX) injection 80 mg  80 mg Subcutaneous Q24H Rise Patience, MD   80 mg at 02/19/12 0432  . furosemide (LASIX) injection 40 mg  40 mg Intravenous Once Sueanne Margarita, MD   40 mg at 02/18/12 2338  . furosemide (LASIX) injection 80 mg  80 mg Intravenous BID Jolaine Artist, MD   80 mg at 02/18/12 1653  . glipiZIDE (GLUCOTROL) tablet 5 mg  5 mg Oral BID AC Domenic Polite, MD   5 mg at 02/18/12 1652  . hydrALAZINE (APRESOLINE) tablet 25 mg  25 mg Oral Q8H Larey Dresser, MD      . insulin aspart (novoLOG) injection 0-9 Units  0-9 Units Subcutaneous TID WC Rise Patience, MD   1 Units at 02/18/12 1653  . insulin glargine (LANTUS) injection 10 Units  10 Units Subcutaneous QHS Domenic Polite, MD   10 Units at 02/18/12 2118  . isosorbide mononitrate (IMDUR) 24 hr tablet 30 mg  30 mg Oral Daily Jolaine Artist, MD   30 mg at 02/18/12 0842  . losartan (COZAAR) tablet 50 mg  50 mg Oral Daily Jolaine Artist, MD   50 mg at 02/18/12 0841  . milrinone [facility]) infusion 200 mcg/mL (0.2 mg/mL)  0.25 mcg/kg/min Intravenous Continuous Jolaine Artist, MD 12 mL/hr at 02/18/12 2341 0.25 mcg/kg/min at 02/18/12 2341  . ondansetron (ZOFRAN) tablet 4 mg  4 mg Oral Q6H PRN Rise Patience, MD       Or  . ondansetron [facility]) injection 4 mg  4 mg Intravenous Q6H PRN Rise Patience, MD      .  ondansetron (ZOFRAN) injection 4 mg  4 mg Intravenous Q6H PRN Pixie Casino, MD      . potassium chloride (KLOR-CON) packet 20 mEq  20 mEq Oral Once Larey Dresser, MD      . potassium chloride SA (K-DUR,KLOR-CON) CR tablet 40 mEq  40 mEq Oral Daily Amy D Clegg, NP   40 mEq at 02/18/12 0842  . sodium chloride 0.9 % injection 10-40 mL  10-40 mL Intracatheter PRN Pixie Casino, MD      . sodium chloride 0.9 % injection 3 mL  3 mL Intravenous Q12H Rise Patience, MD   3 mL at 02/18/12 2115  . spironolactone (ALDACTONE) tablet 25 mg  25 mg Oral Daily Jolaine Artist, MD   25 mg at 02/18/12 0841  . DISCONTD: hydrALAZINE (APRESOLINE) tablet 12.5 mg  12.5 mg Oral Q8H Jolaine Artist, MD   12.5 mg at 02/19/12 0600  . DISCONTD: sodium chloride 0.9 % injection 3 mL  3 mL Intravenous Q12H Rise Patience, MD   3 mL at 02/17/12 1024    Physical  Exam: General appearance: alert and no distress Neck: no adenopathy, no carotid bruit, no JVD, supple, symmetrical, trachea midline and thyroid not enlarged, symmetric, no tenderness/mass/nodules Lungs: clear to auscultation bilaterally Heart: regular rate and rhythm and systolic murmur: early systolic 2/6, crescendo at apex Abdomen: soft, non-tender; bowel sounds normal; no masses,  no organomegaly Extremities: extremities normal, atraumatic, no cyanosis or edema Pulses: 2+ and symmetric Skin: Skin color, texture, turgor normal. No rashes or lesions Neurologic: Grossly normal  Lab Results: Results for orders placed during the hospital encounter of 02/15/12 (from the past 48 hour(s))  BASIC METABOLIC PANEL     Status: Abnormal   Collection Time   02/17/12  9:02 AM      Component Value Range Comment   Sodium 139  135 - 145 mEq/L    Potassium 3.6  3.5 - 5.1 mEq/L    Chloride 102  96 - 112 mEq/L    CO2 27  19 - 32 mEq/L    Glucose, Bld 164 (*) 70 - 99 mg/dL    BUN 13  6 - 23 mg/dL    Creatinine, Ser 0.92  0.50 - 1.35 mg/dL    Calcium 8.4  8.4 - 10.5 mg/dL    GFR calc non Af Amer >90  >90 mL/min    GFR calc Af Amer >90  >90 mL/min   GLUCOSE, CAPILLARY     Status: Abnormal   Collection Time   02/17/12 11:16 AM      Component Value Range Comment   Glucose-Capillary 202 (*) 70 - 99 mg/dL    Comment 1 Notify RN     GLUCOSE, CAPILLARY     Status: Abnormal   Collection Time   02/17/12  4:10 PM      Component Value Range Comment   Glucose-Capillary 156 (*) 70 - 99 mg/dL    Comment 1 Notify RN     GLUCOSE, CAPILLARY     Status: Abnormal   Collection Time   02/17/12  9:21 PM      Component Value Range Comment   Glucose-Capillary 181 (*) 70 - 99 mg/dL   CARBOXYHEMOGLOBIN     Status: Abnormal   Collection Time   02/17/12 10:50 PM      Component Value Range Comment   Total hemoglobin 13.8  13.5 - 18.0 g/dL    O2 Saturation 64.0  Carboxyhemoglobin 1.6 (*) 0.5 - 1.5 %    Methemoglobin  1.0  0.0 - 1.5 %   BASIC METABOLIC PANEL     Status: Abnormal   Collection Time   02/18/12  5:00 AM      Component Value Range Comment   Sodium 138  135 - 145 mEq/L    Potassium 3.6  3.5 - 5.1 mEq/L    Chloride 104  96 - 112 mEq/L    CO2 29  19 - 32 mEq/L    Glucose, Bld 199 (*) 70 - 99 mg/dL    BUN 11  6 - 23 mg/dL    Creatinine, Ser 0.84  0.50 - 1.35 mg/dL    Calcium 8.2 (*) 8.4 - 10.5 mg/dL    GFR calc non Af Amer >90  >90 mL/min    GFR calc Af Amer >90  >90 mL/min   CARBOXYHEMOGLOBIN     Status: Abnormal   Collection Time   02/18/12  5:00 AM      Component Value Range Comment   Total hemoglobin 12.0 (*) 13.5 - 18.0 g/dL    O2 Saturation 71.1      Carboxyhemoglobin 1.7 (*) 0.5 - 1.5 %    Methemoglobin 0.9  0.0 - 1.5 %   GLUCOSE, CAPILLARY     Status: Abnormal   Collection Time   02/18/12  8:30 AM      Component Value Range Comment   Glucose-Capillary 148 (*) 70 - 99 mg/dL   GLUCOSE, CAPILLARY     Status: Abnormal   Collection Time   02/18/12 11:55 AM      Component Value Range Comment   Glucose-Capillary 182 (*) 70 - 99 mg/dL   GLUCOSE, CAPILLARY     Status: Abnormal   Collection Time   02/18/12  4:09 PM      Component Value Range Comment   Glucose-Capillary 194 (*) 70 - 99 mg/dL   GLUCOSE, CAPILLARY     Status: Abnormal   Collection Time   02/18/12  9:13 PM      Component Value Range Comment   Glucose-Capillary 161 (*) 70 - 99 mg/dL   BASIC METABOLIC PANEL     Status: Abnormal   Collection Time   02/19/12  4:14 AM      Component Value Range Comment   Sodium 136  135 - 145 mEq/L    Potassium 3.7  3.5 - 5.1 mEq/L    Chloride 101  96 - 112 mEq/L    CO2 28  19 - 32 mEq/L    Glucose, Bld 198 (*) 70 - 99 mg/dL    BUN 9  6 - 23 mg/dL    Creatinine, Ser 0.89  0.50 - 1.35 mg/dL    Calcium 8.5  8.4 - 10.5 mg/dL    GFR calc non Af Amer >90  >90 mL/min    GFR calc Af Amer >90  >90 mL/min   CARBOXYHEMOGLOBIN     Status: Normal (Preliminary result)   Collection Time   02/19/12   5:00 AM      Component Value Range Comment   Total hemoglobin PENDING  13.5 - 18.0 g/dL    O2 Saturation 72.1      Carboxyhemoglobin PENDING  0.5 - 1.5 %    Methemoglobin PENDING  0.0 - 1.5 %    Total oxygen content PENDING  15.0 - 23.0 mL/dL   GLUCOSE, CAPILLARY     Status: Abnormal   Collection Time  02/19/12  7:36 AM      Component Value Range Comment   Glucose-Capillary 142 (*) 70 - 99 mg/dL     Imaging: No results found.  Assessment:  1. Principal Problem: 2.  *CHF (congestive heart failure) 3. Active Problems: 4.  DM2 (diabetes mellitus, type 2) 5.  Hypertension 6.  Cardiomyopathy:  EF<20%  02/15/2012 7.  NSVT (nonsustained ventricular tachycardia) 8.  Obesity 9.  Acute on chronic systolic heart failure 10.  Cardiogenic shock 11.   Plan:  1. Per HF service, push afterload reduction today. His SVR was not particularly high, but a lower blood pressure should help cardiac output somewhat in the absence of milrinone. I suspect he will not require home inotropes. I would continue diuresis, probably for the next few days .Marland Kitchen There is easily another 15-20L of fluid to diurese. Agree with checking digoxin level.   Time Spent Directly with Patient:  15 minutes  Length of Stay:  LOS: 4 days   Pixie Casino, MD, [facility] Attending Cardiologist [facility] 02/19/2012, 8:31 AM

## 2012-02-19 NOTE — Progress Notes (Signed)
Patient ID: JIOVANY SCHNICK, male   DOB: Sep 19, 1975, 36 y.o.   MRN: [id number] Advanced Heart Failure Rounding Note   Subjective:   Mr Guenette is a 36 year old AA with a history of DM, HTN, and chronic systolic heart failure (diagnosed in 2010 EF 30-35%). He has been followed by Dr Rollene Fare for HTN and heart failure but he has not been seen since 05/2010.   ECHO 02/15/12 EF <20%  02/16/12 RHC/LHC RA - 28  RV - 61/29  PA - 63/18 (31) FCO/CI - 4.28/1.59  TDCO/CI - 3.46/1.28  AO Sat - 94% (room air)  PA Sat - 49% (room air)  LVEDP 28 PVR = 0.8 [place] Moderate tubular mid-LAD lesion, otherwise no significant obstructive CAD.  Started on Milrinone post catherization. 24 hour I/O  -5.6 liters. Weight down 8 pounds.   Breathing better, no orthopnea.  Renal fxn stable, co-ox 72%, CVP 14 (measured myself this morning).      Objective:   Weight Range:  Vital Signs:   Temp:  [97.6 F (36.4 C)-99.1 F (37.3 C)] 97.7 F (36.5 C) (09/01 0736) Resp:  [16-20] 16  (09/01 0500) BP: (103-133)/(54-85) 109/54 mmHg (09/01 0500) SpO2:  [94 %-98 %] 95 % (09/01 0306) Weight:  [329 lb 9.4 oz (149.5 kg)] 329 lb 9.4 oz (149.5 kg) (09/01 0432) Last BM Date: 02/15/12  Weight change: Filed Weights   02/17/12 0553 02/18/12 0500 02/19/12 0432  Weight: 348 lb 9.6 oz (158.124 kg) 340 lb 9.8 oz (154.5 kg) 329 lb 9.4 oz (149.5 kg)    Intake/Output:   Intake/Output Summary (Last 24 hours) at 02/19/12 0740 Last data filed at 02/19/12 0600  Gross per 24 hour  Intake   2313 ml  Output   9025 ml  Net  -6712 ml     Physical Exam: General: Well appearing. No resp difficulty  HEENT: normal  Neck: supple. JVP 10-11 cm. Carotids 2+ bilat; no bruits. No lymphadenopathy or thryomegaly appreciated.  Cor: PMI nondisplaced. Regular rate & rhythm. No rubs, gallops or murmurs.  Lungs: clear  Abdomen:obese, nontender, distended. No hepatosplenomegaly. No bruits or masses. Good bowel sounds.  Extremities: no  cyanosis, clubbing.  1+ edema 1/2 up lower legs bilaterally.   Neuro: alert & orientedx3, moves all 4 extremities w/o difficulty. Affect pleasant  Telemetry: SR  Labs: Basic Metabolic Panel:  Lab [id number] 02/18/12 0500 02/17/12 0902 02/16/12 0525 02/15/12 1710 02/15/12 0830  NA 136 138 139 137 138 --  K 3.7 3.6 3.6 3.5 3.9 --  CL 101 104 102 99 100 --  CO2 28 29 27 30 31  --  GLUCOSE 198* 199* 164* 242* 253* --  BUN 9 11 13 16 16  --  CREATININE 0.89 0.84 0.92 1.07 1.07 --  CALCIUM 8.5 8.2* 8.4 -- -- --  MG -- -- -- -- -- 1.7  PHOS -- -- -- -- -- --    Liver Function Tests:  Lab 02/15/12 0830 02/14/12 2155  AST 24 27  ALT 23 28  ALKPHOS 107 130*  BILITOT 0.8 1.0  PROT 5.6* 6.6  ALBUMIN 2.1* 2.5*   No results found for this basename: LIPASE:5,AMYLASE:5 in the last 168 hours No results found for this basename: AMMONIA:3 in the last 168 hours  CBC:  Lab 02/15/12 1710 02/15/12 0830 02/14/12 2155  WBC 7.5 6.5 8.4  NEUTROABS -- 4.2 6.0  HGB 13.7 13.5 14.7  HCT 41.3 40.4 43.3  MCV 88.2 88.2 87.7  PLT 248 245  275    Cardiac Enzymes:  Lab 02/15/12 0830 02/14/12 2155  CKTOTAL 98 --  CKMB 1.8 --  CKMBINDEX -- --  TROPONINI <0.30 <0.30    BNP: BNP (last 3 results)  Basename 02/14/12 2155  PROBNP 2637.0*     Other results:  Imaging: No results found.   Medications:     Scheduled Medications:    . aspirin EC  81 mg Oral Daily  . carvedilol  3.125 mg Oral BID WC  . digoxin  0.25 mg Oral Daily  . enoxaparin (LOVENOX) injection  80 mg Subcutaneous Q24H  . furosemide  40 mg Intravenous Once  . furosemide  80 mg Intravenous BID  . glipiZIDE  5 mg Oral BID AC  . hydrALAZINE  12.5 mg Oral Q8H  . insulin aspart  0-9 Units Subcutaneous TID WC  . insulin glargine  10 Units Subcutaneous QHS  . isosorbide mononitrate  30 mg Oral Daily  . losartan  50 mg Oral Daily  . potassium chloride  40 mEq Oral Daily  . sodium chloride  3 mL Intravenous Q12H  .  spironolactone  25 mg Oral Daily  . DISCONTD: potassium chloride  40 mEq Oral Once  . DISCONTD: sodium chloride  3 mL Intravenous Q12H    Infusions:    . sodium chloride 10 mL/hr at 02/17/12 1949  . milrinone 0.25 mcg/kg/min (02/18/12 2341)    PRN Medications: acetaminophen, acetaminophen, acetaminophen, ondansetron (ZOFRAN) IV, ondansetron (ZOFRAN) IV, ondansetron, sodium chloride   Assessment:   1. A/C systolic heart failure EF <20%, nonischemic cardiomyopathy 2. HTN  3. Uncontrolled DM Hgb A1C 14.3  4. Obesity  5. Noncompliance   Plan/Discussion:   Patient is still volume overloaded on exam but has diuresed well with IV Lasix and milrinone, weight is down 11 lb compared to yesterday.  Renal fxn stable.  Good co-ox on milrinone 0.25.  He may need only 1 more day of the current level of milrinone/IV Lasix.  Would tentatively begin tapering the milrinone tomorrow morning and perhaps change over to po Lasix at that time as well.  Will increase hydralazine to 25 mg every 8 hours today (has BP room).  Will need further adjustments to afterload reduction regimen as we titrate off milrinone.  Will check digoxin level.   Currently not a candidate for advanced therapies due to body habitus, limited insight, noncompliance, poorly controlled DM2 and RV dysfunction.  Larita Deremer,MD 7:40 AM

## 2012-02-19 NOTE — Progress Notes (Addendum)
Joshua Massey [phone] Francee Piccolo)  will come 2 Sept 13 around 1830 to fit vest. Per MD notes pt will be here for a few more days.

## 2012-02-20 LAB — BASIC METABOLIC PANEL
BUN: 9 mg/dL (ref 6–23)
Chloride: 104 mEq/L (ref 96–112)
Creatinine, Ser: 0.84 mg/dL (ref 0.50–1.35)
Glucose, Bld: 166 mg/dL — ABNORMAL HIGH (ref 70–99)
Potassium: 3.8 mEq/L (ref 3.5–5.1)

## 2012-02-20 LAB — CBC
HCT: 41.3 % (ref 39.0–52.0)
Hemoglobin: 14.1 g/dL (ref 13.0–17.0)
MCH: 29.6 pg (ref 26.0–34.0)
MCHC: 34.1 g/dL (ref 30.0–36.0)
RDW: 14.6 % (ref 11.5–15.5)

## 2012-02-20 LAB — CARBOXYHEMOGLOBIN
Carboxyhemoglobin: 1.5 % (ref 0.5–1.5)
Methemoglobin: 1 % (ref 0.0–1.5)

## 2012-02-20 LAB — GLUCOSE, CAPILLARY: Glucose-Capillary: 173 mg/dL — ABNORMAL HIGH (ref 70–99)

## 2012-02-20 MED ORDER — ENOXAPARIN SODIUM 40 MG/0.4ML ~~LOC~~ SOLN: 40.0000 mg | SUBCUTANEOUS | Status: DC

## 2012-02-20 MED ORDER — HYDRALAZINE HCL 25 MG PO TABS
37.5000 mg | ORAL_TABLET | Freq: Three times a day (TID) | ORAL | Status: DC
  Administered 2012-02-20 – 2012-02-21 (×3): 37.5 mg via ORAL
  Filled 2012-02-20 (×6): qty 1.5

## 2012-02-20 MED ORDER — MILRINONE IN DEXTROSE 200-5 MCG/ML-% IV SOLN
0.1250 ug/kg/min | INTRAVENOUS | Status: DC
  Administered 2012-02-20 – 2012-02-21 (×5): 0.25 ug/kg/min via INTRAVENOUS
  Administered 2012-02-22: 0.125 ug/kg/min via INTRAVENOUS
  Filled 2012-02-20 (×7): qty 100

## 2012-02-20 MED ORDER — FUROSEMIDE 10 MG/ML IJ SOLN
40.0000 mg | Freq: Two times a day (BID) | INTRAMUSCULAR | Status: DC
  Administered 2012-02-20 – 2012-02-21 (×3): 40 mg via INTRAVENOUS
  Filled 2012-02-20 (×4): qty 4

## 2012-02-20 MED ORDER — ENOXAPARIN SODIUM 80 MG/0.8ML ~~LOC~~ SOLN
80.0000 mg | SUBCUTANEOUS | Status: DC
  Administered 2012-02-21 – 2012-02-23 (×3): 80 mg via SUBCUTANEOUS
  Filled 2012-02-20 (×4): qty 0.8

## 2012-02-20 NOTE — Progress Notes (Signed)
For DM: CBGs better, mostly <180 Agreeable to take insulin at home now Continue lantus QHS at bedtime Continue Glipizide at current dose Add metformin at DC Add insulin teaching  Domenic Polite, [place]

## 2012-02-20 NOTE — Progress Notes (Signed)
[facility]  Subjective: Doing well.  Objective: Vital signs in last 24 hours: Temp:  [98.1 F (36.7 C)-99 F (37.2 C)] 99 F (37.2 C) (09/02 0737) Resp:  [16-20] 18  (09/02 0800) BP: (124-135)/(76-94) 124/83 mmHg (09/02 0800) SpO2:  [93 %-96 %] 93 % (09/02 0414) Weight:  [143.7 kg (316 lb 12.8 oz)] 143.7 kg (316 lb 12.8 oz) (09/02 0444) Last BM Date: 02/15/12  Intake/Output from previous day: 09/01 0701 - 09/02 0700 In: 1932 [P.O.:1420; I.V.:496; IV Piggyback:16] Out: 8600 [Urine:8600] Intake/Output this shift: Total I/O In: 30 [I.V.:22; IV Piggyback:8] Out: 300 [Urine:300]  Medications Current Facility-Administered Medications  Medication Dose Route Frequency Provider Last Rate Last Dose  . 0.9 %  sodium chloride infusion   Intravenous Continuous Jolaine Artist, MD 10 mL/hr at 02/17/12 1949    . acetaminophen (TYLENOL) tablet 650 mg  650 mg Oral Q6H PRN Rise Patience, MD   650 mg at 02/19/12 1539   Or  . acetaminophen (TYLENOL) suppository 650 mg  650 mg Rectal Q6H PRN Rise Patience, MD      . acetaminophen (TYLENOL) tablet 650 mg  650 mg Oral Q4H PRN Pixie Casino, MD   650 mg at 02/19/12 2332  . aspirin EC tablet 81 mg  81 mg Oral Daily Jolaine Artist, MD   81 mg at 02/19/12 0920  . carvedilol (COREG) tablet 3.125 mg  3.125 mg Oral BID WC Jolaine Artist, MD   3.125 mg at 02/19/12 1744  . digoxin (LANOXIN) tablet 0.25 mg  0.25 mg Oral Daily Jolaine Artist, MD   0.25 mg at 02/19/12 0920  . enoxaparin (LOVENOX) injection 80 mg  80 mg Subcutaneous Q24H Rise Patience, MD   80 mg at 02/20/12 0427  . furosemide (LASIX) injection 80 mg  80 mg Intravenous BID Jolaine Artist, MD   80 mg at 02/20/12 0747  . glipiZIDE (GLUCOTROL) tablet 5 mg  5 mg Oral BID AC Domenic Polite, MD   5 mg at 02/19/12 1744  . hydrALAZINE (APRESOLINE) tablet 25 mg  25 mg Oral Q8H Larey Dresser, MD   25 mg at 02/20/12 0530  . insulin  aspart (novoLOG) injection 0-9 Units  0-9 Units Subcutaneous TID WC Rise Patience, MD   1 Units at 02/20/12 0747  . insulin glargine (LANTUS) injection 10 Units  10 Units Subcutaneous QHS Domenic Polite, MD   10 Units at 02/19/12 2226  . isosorbide mononitrate (IMDUR) 24 hr tablet 30 mg  30 mg Oral Daily Jolaine Artist, MD   30 mg at 02/19/12 0920  . losartan (COZAAR) tablet 50 mg  50 mg Oral Daily Jolaine Artist, MD   50 mg at 02/19/12 0920  . milrinone (PRIMACOR) infusion 200 mcg/mL (0.2 mg/mL)  0.25 mcg/kg/min Intravenous Continuous Jolaine Artist, MD 12 mL/hr at 02/20/12 0103 0.25 mcg/kg/min at 02/20/12 0103  . ondansetron (ZOFRAN) tablet 4 mg  4 mg Oral Q6H PRN Rise Patience, MD       Or  . ondansetron [facility]) injection 4 mg  4 mg Intravenous Q6H PRN Rise Patience, MD      . ondansetron [facility]) injection 4 mg  4 mg Intravenous Q6H PRN Pixie Casino, MD      . potassium chloride (KLOR-CON) packet 20 mEq  20 mEq Oral Once Larey Dresser, MD   20 mEq at 02/19/12 1000  . potassium chloride SA (  K-DUR,KLOR-CON) CR tablet 40 mEq  40 mEq Oral Daily Amy D Clegg, NP   40 mEq at 02/19/12 0920  . sodium chloride 0.9 % injection 10-40 mL  10-40 mL Intracatheter PRN Pixie Casino, MD      . sodium chloride 0.9 % injection 3 mL  3 mL Intravenous Q12H Rise Patience, MD   3 mL at 02/19/12 2230  . spironolactone (ALDACTONE) tablet 25 mg  25 mg Oral Daily Jolaine Artist, MD   25 mg at 02/19/12 0920    PE: General appearance: alert, cooperative and no distress Lungs: Basilar rales. Heart: Reg rhythm, rate elevated. no MM Extremities: Tense edema. Pulses: radials 2+ and symmetric Neurologic: Grossly normal  Lab Results:  No results found for this basename: WBC:3,HGB:3,HCT:3,PLT:3 in the last 72 hours BMET  Basename 02/20/12 0439 02/19/12 0414 02/18/12 0500  NA 139 136 138  K 3.8 3.7 3.6  CL 104 101 104  CO2 28 28 29   GLUCOSE 166* 198* 199*  BUN 9 9  11   CREATININE 0.84 0.89 0.84  CALCIUM 8.4 8.5 8.2*    Assessment/Plan   Principal Problem:  *CHF (congestive heart failure) Active Problems:  NSVT (nonsustained ventricular tachycardia)  DM2 (diabetes mellitus, type 2)  Hypertension  Cardiomyopathy:  EF<20%  02/15/2012  Obesity  Acute on chronic systolic heart failure  Cardiogenic shock  Plan:  6.6 L negative in the last 24 hours.  Life vest will be fit today.  Still tachycardic.  Recheck BMET now.     LOS: 5 days    Joshua Massey 02/20/2012 8:13 AM

## 2012-02-20 NOTE — Plan of Care (Signed)
Problem: Phase I Progression Outcomes Goal: Other Phase I Outcomes/Goals Outcome: Completed/Met Date Met:  02/20/12 Pt watched life-vest video per md request

## 2012-02-20 NOTE — Progress Notes (Signed)
Patient ID: Joshua Massey, male   DOB: 1975/09/06, 36 y.o.   MRN: [id number] Advanced Heart Failure Rounding Note   Subjective:   Joshua Massey is a 36 year old AA with a history of DM, HTN, and chronic systolic heart failure (diagnosed in 2010 EF 30-35%). He has been followed by Dr Rollene Fare for HTN and heart failure but he has not been seen since 05/2010.   ECHO 02/15/12 EF <20%  02/16/12 RHC/LHC RA - 28  RV - 61/29  PA - 63/18 (31) FCO/CI - 4.28/1.59  TDCO/CI - 3.46/1.28  AO Sat - 94% (room air)  PA Sat - 49% (room air)  LVEDP 28 PVR = 0.8 Woods Moderate tubular mid-LAD lesion, otherwise no significant obstructive CAD.  Started on Milrinone post catherization. 24 hour I/O    Continues to diurese well on milrinone. Now down 40 pounds.   Breathing better, no orthopnea.  Renal fxn stable, co-ox 59%, (was 72%) CVP 15 (measured myself this morning). HR 120 sitting on side of bed (sinus)    Objective:   Weight Range:  Vital Signs:   Temp:  [98.1 F (36.7 C)-99 F (37.2 C)] 99 F (37.2 C) (09/02 0737) Resp:  [16-20] 18  (09/02 0800) BP: (124-135)/(76-94) 124/83 mmHg (09/02 0800) SpO2:  [93 %-96 %] 93 % (09/02 0414) Weight:  [143.7 kg (316 lb 12.8 oz)] 143.7 kg (316 lb 12.8 oz) (09/02 0444) Last BM Date: 02/15/12  Weight change: Filed Weights   02/18/12 0500 02/19/12 0432 02/20/12 0444  Weight: 154.5 kg (340 lb 9.8 oz) 149.5 kg (329 lb 9.4 oz) 143.7 kg (316 lb 12.8 oz)    Intake/Output:   Intake/Output Summary (Last 24 hours) at 02/20/12 0851 Last data filed at 02/20/12 0830  Gross per 24 hour  Intake   1940 ml  Output   8900 ml  Net  -6960 ml     Physical Exam: General: Well appearing. No resp difficulty  HEENT: normal  Neck: supple. JVP up. Carotids 2+ bilat; no bruits. No lymphadenopathy or thryomegaly appreciated.  Cor: Tachy regular rate & rhythm. No rubs, gallops or murmurs.  +s3 Lungs: clear  Abdomen:obese, nontender, distended. No hepatosplenomegaly.  No bruits or masses. Good bowel sounds.  Extremities: no cyanosis, clubbing.  1+ edema 1/2 up lower legs bilaterally.   Neuro: alert & orientedx3, moves all 4 extremities w/o difficulty. Affect pleasant  Telemetry: SR  Labs: Basic Metabolic Panel:  Lab [id number] 02/19/12 0414 02/18/12 0500 02/17/12 0902 02/16/12 0525 02/15/12 0830  NA 139 136 138 139 137 --  K 3.8 3.7 3.6 3.6 3.5 --  CL 104 101 104 102 99 --  CO2 28 28 29 27 30  --  GLUCOSE 166* 198* 199* 164* 242* --  BUN 9 9 11 13 16  --  CREATININE 0.84 0.89 0.84 0.92 1.07 --  CALCIUM 8.4 8.5 8.2* -- -- --  MG -- -- -- -- -- 1.7  PHOS -- -- -- -- -- --    Liver Function Tests:  Lab 02/15/12 0830 02/14/12 2155  AST 24 27  ALT 23 28  ALKPHOS 107 130*  BILITOT 0.8 1.0  PROT 5.6* 6.6  ALBUMIN 2.1* 2.5*   No results found for this basename: LIPASE:5,AMYLASE:5 in the last 168 hours No results found for this basename: AMMONIA:3 in the last 168 hours  CBC:  Lab 02/15/12 1710 02/15/12 0830 02/14/12 2155  WBC 7.5 6.5 8.4  NEUTROABS -- 4.2 6.0  HGB 13.7 13.5 14.7  HCT 41.3 40.4 43.3  MCV 88.2 88.2 87.7  PLT 248 245 275    Cardiac Enzymes:  Lab 02/15/12 0830 02/14/12 2155  CKTOTAL 98 --  CKMB 1.8 --  CKMBINDEX -- --  TROPONINI <0.30 <0.30    BNP: BNP (last 3 results)  Basename 02/14/12 2155  PROBNP 2637.0*     Other results:  Imaging: No results found.   Medications:     Scheduled Medications:    . aspirin EC  81 mg Oral Daily  . carvedilol  3.125 mg Oral BID WC  . digoxin  0.25 mg Oral Daily  . enoxaparin (LOVENOX) injection  80 mg Subcutaneous Q24H  . furosemide  80 mg Intravenous BID  . glipiZIDE  5 mg Oral BID AC  . hydrALAZINE  25 mg Oral Q8H  . insulin aspart  0-9 Units Subcutaneous TID WC  . insulin glargine  10 Units Subcutaneous QHS  . isosorbide mononitrate  30 mg Oral Daily  . losartan  50 mg Oral Daily  . potassium chloride  20 mEq Oral Once  . potassium chloride  40 mEq Oral  Daily  . sodium chloride  3 mL Intravenous Q12H  . spironolactone  25 mg Oral Daily    Infusions:    . sodium chloride 10 mL/hr at 02/17/12 1949  . milrinone 0.125 mcg/kg/min (02/20/12 0830)  . DISCONTD: milrinone 0.25 mcg/kg/min (02/20/12 0103)    PRN Medications: acetaminophen, acetaminophen, acetaminophen, ondansetron (ZOFRAN) IV, ondansetron (ZOFRAN) IV, ondansetron, sodium chloride   Assessment:   1. A/C systolic heart failure EF <20%, nonischemic cardiomyopathy 2. HTN  3. Uncontrolled DM Hgb A1C 14.3  4. Obesity  5. Noncompliance   Plan/Discussion:    Now down 30 pounds but remains fairly tenuous with prominent sinus tachycardia. Still with some volume overload but given RV dysfunction (and possible R-sided volume dependence) this may be as low as we can push CVP for now. As co-ox is falling would not reduce milrinone today. Would keep at 0.25 and see if this will let us diurese for one more day. Will cut lasix dose back a little. Increase hydralazine. Check CBC to make sure he is not developing another reason for his sinus tach. Would not push b-blocker just yet. Prognosis remains very tenuous.  Currently not a candidate for advanced therapies due to body habitus, limited insight, noncompliance, poorly controlled DM2 and RV dysfunction.  Daniel Bensimhon,MD 8:51 AM

## 2012-02-20 NOTE — Progress Notes (Signed)
Pt. Seen and examined. Agree with the NP/PA-C note as written.  Continues to diurese well (negative another 6L) - he still has significant fluid to diurese. Hydralazine increased yesterday. Will wean milrinone to 0.125 mcg/kg/min today. Consider uptitrating coreg prior to discharge. I think he needs a few more days of diuresis. Creatinine has not increased. Will be fitted for LifeVest today. I have encouraged him to view the video today.  Pixie Casino, MD, [facility] Attending Cardiologist [facility]

## 2012-02-21 DIAGNOSIS — R609 Edema, unspecified: Secondary | ICD-10-CM

## 2012-02-21 LAB — GLUCOSE, CAPILLARY
Glucose-Capillary: 137 mg/dL — ABNORMAL HIGH (ref 70–99)
Glucose-Capillary: 150 mg/dL — ABNORMAL HIGH (ref 70–99)
Glucose-Capillary: 141 mg/dL — ABNORMAL HIGH (ref 70–99)

## 2012-02-21 LAB — CARBOXYHEMOGLOBIN
Carboxyhemoglobin: 2 % — ABNORMAL HIGH (ref 0.5–1.5)
Carboxyhemoglobin: 1.7 % — ABNORMAL HIGH (ref 0.5–1.5)
Methemoglobin: 0.8 % (ref 0.0–1.5)
Methemoglobin: 1 % (ref 0.0–1.5)
O2 Saturation: 72.1 %
O2 Saturation: 68.5 %
Total hemoglobin: 12.5 g/dL — ABNORMAL LOW (ref 13.5–18.0)

## 2012-02-21 LAB — BASIC METABOLIC PANEL
CO2: 27 mEq/L (ref 19–32)
Creatinine, Ser: 0.78 mg/dL (ref 0.50–1.35)
GFR calc Af Amer: >90 mL/min (ref >90)
GFR calc non Af Amer: >90 mL/min (ref >90)
Glucose, Bld: 108 mg/dL — ABNORMAL HIGH (ref 70–99)

## 2012-02-21 MED ORDER — LIVING WELL WITH DIABETES BOOK
Freq: Once | Status: AC
  Administered 2012-02-21: 17:00:00
  Filled 2012-02-21: qty 1

## 2012-02-21 MED ORDER — ISOSORBIDE MONONITRATE ER 30 MG PO TB24
30.0000 mg | ORAL_TABLET | Freq: Two times a day (BID) | ORAL | Status: DC
  Administered 2012-02-21 – 2012-02-23 (×6): 30 mg via ORAL
  Filled 2012-02-21 (×7): qty 1

## 2012-02-21 MED ORDER — LOSARTAN POTASSIUM 25 MG PO TABS
25.0000 mg | ORAL_TABLET | Freq: Every day | ORAL | Status: DC
  Administered 2012-02-21 – 2012-02-23 (×3): 25 mg via ORAL
  Filled 2012-02-21 (×4): qty 1

## 2012-02-21 MED ORDER — INSULIN PEN STARTER KIT
1.0000 | Freq: Once | Status: AC
  Administered 2012-02-22: 1
  Filled 2012-02-21: qty 1

## 2012-02-21 MED ORDER — INSULIN PEN STARTER KIT: 1.0000 | Freq: Once | Status: DC

## 2012-02-21 MED ORDER — HYDRALAZINE HCL 50 MG PO TABS
50.0000 mg | ORAL_TABLET | Freq: Three times a day (TID) | ORAL | Status: DC
  Administered 2012-02-21 – 2012-02-22 (×3): 50 mg via ORAL
  Filled 2012-02-21 (×6): qty 1

## 2012-02-21 MED ORDER — BD GETTING STARTED TAKE HOME KIT: 3/10ML X 30G SYRINGES
1.0000 | Freq: Once | Status: DC
  Filled 2012-02-21: qty 1

## 2012-02-21 NOTE — Progress Notes (Signed)
[facility]  Subjective: Doing well.  Objective: Vital signs in last 24 hours: Temp:  [97.6 F (36.4 C)-98.7 F (37.1 C)] 98.7 F (37.1 C) (09/03 0739) Pulse Rate:  [102-105] 102  (09/02 1545) Resp:  [18-20] 20  (09/03 0000) BP: (120-132)/(83-87) 123/86 mmHg (09/03 0734) SpO2:  [95 %-97 %] 95 % (09/03 0739) Weight:  [139.3 kg (307 lb 1.6 oz)] 139.3 kg (307 lb 1.6 oz) (09/03 0458) Last BM Date: 02/15/12  Intake/Output from previous day: 09/02 0701 - 09/03 0700 In: 1396.8 [P.O.:880; I.V.:504.8; IV Piggyback:12] Out: [id number] [Urine:5525] Intake/Output this shift:    Medications Current Facility-Administered Medications  Medication Dose Route Frequency Provider Last Rate Last Dose  . 0.9 %  sodium chloride infusion   Intravenous Continuous Jolaine Artist, MD 10 mL/hr at 02/17/12 1949    . acetaminophen (TYLENOL) tablet 650 mg  650 mg Oral Q6H PRN Rise Patience, MD   650 mg at 02/19/12 1539   Or  . acetaminophen (TYLENOL) suppository 650 mg  650 mg Rectal Q6H PRN Rise Patience, MD      . acetaminophen (TYLENOL) tablet 650 mg  650 mg Oral Q4H PRN Pixie Casino, MD   650 mg at 02/20/12 1401  . aspirin EC tablet 81 mg  81 mg Oral Daily Jolaine Artist, MD   81 mg at 02/21/12 0849  . carvedilol (COREG) tablet 3.125 mg  3.125 mg Oral BID WC Jolaine Artist, MD   3.125 mg at 02/21/12 0849  . digoxin (LANOXIN) tablet 0.25 mg  0.25 mg Oral Daily Jolaine Artist, MD   0.25 mg at 02/21/12 0849  . enoxaparin (LOVENOX) injection 80 mg  80 mg Subcutaneous Q24H Jolaine Artist, MD   80 mg at 02/21/12 0548  . furosemide (LASIX) injection 40 mg  40 mg Intravenous BID Jolaine Artist, MD   40 mg at 02/21/12 0757  . glipiZIDE (GLUCOTROL) tablet 5 mg  5 mg Oral BID AC Domenic Polite, MD   5 mg at 02/21/12 0848  . hydrALAZINE (APRESOLINE) tablet 50 mg  50 mg Oral Q8H Daniel R Bensimhon, MD      . insulin aspart (novoLOG) injection 0-9  Units  0-9 Units Subcutaneous TID WC Rise Patience, MD   1 Units at 02/21/12 [phone]  . insulin glargine (LANTUS) injection 10 Units  10 Units Subcutaneous QHS Domenic Polite, MD   10 Units at 02/20/12 2121  . isosorbide mononitrate (IMDUR) 24 hr tablet 30 mg  30 mg Oral BID Jolaine Artist, MD   30 mg at 02/21/12 0848  . losartan (COZAAR) tablet 50 mg  50 mg Oral Daily Jolaine Artist, MD   50 mg at 02/21/12 0849  . milrinone (PRIMACOR) infusion 200 mcg/mL (0.2 mg/mL)  0.25 mcg/kg/min Intravenous Continuous Jolaine Artist, MD 10.8 mL/hr at 02/21/12 0443 0.25 mcg/kg/min at 02/21/12 0443  . ondansetron (ZOFRAN) tablet 4 mg  4 mg Oral Q6H PRN Rise Patience, MD       Or  . ondansetron [facility]) injection 4 mg  4 mg Intravenous Q6H PRN Rise Patience, MD      . ondansetron [facility]) injection 4 mg  4 mg Intravenous Q6H PRN Pixie Casino, MD      . potassium chloride SA (K-DUR,KLOR-CON) CR tablet 40 mEq  40 mEq Oral Daily Amy D Clegg, NP   40 mEq at 02/21/12 0848  . sodium chloride 0.9 %  injection 10-40 mL  10-40 mL Intracatheter PRN Pixie Casino, MD      . sodium chloride 0.9 % injection 3 mL  3 mL Intravenous Q12H Rise Patience, MD   3 mL at 02/20/12 2118  . spironolactone (ALDACTONE) tablet 25 mg  25 mg Oral Daily Jolaine Artist, MD   25 mg at 02/21/12 0849  . DISCONTD: hydrALAZINE (APRESOLINE) tablet 37.5 mg  37.5 mg Oral Q8H Jolaine Artist, MD   37.5 mg at 02/21/12 0549  . DISCONTD: isosorbide mononitrate (IMDUR) 24 hr tablet 30 mg  30 mg Oral Daily Jolaine Artist, MD   30 mg at 02/20/12 0942   PE: General appearance: alert, cooperative and no distress  Lungs: Basilar rales.  Heart: Reg rhythm, rate elevated. no MM  Extremities: Tense edema in LE Pulses: radials 2+ and symmetric  Neurologic: Grossly normal  Lab Results:   Basename 02/20/12 0941  WBC 7.4  HGB 14.1  HCT 41.3  PLT 278   BMET  Basename 02/21/12 0500 02/20/12 0439 02/19/12  0414  NA 137 139 136  K 3.8 3.8 3.7  CL 101 104 101  CO2 27 28 28   GLUCOSE 108* 166* 198*  BUN 9 9 9   CREATININE 0.78 0.84 0.89  CALCIUM 8.9 8.4 8.5   Assessment/Plan  Principal Problem:  *CHF (congestive heart failure) Active Problems:  NSVT (nonsustained ventricular tachycardia)  DM2 (diabetes mellitus, type 2)  Hypertension  Cardiomyopathy:  EF<20%  02/15/2012  Obesity  Acute on chronic systolic heart failure  Cardiogenic shock  Plan:  Admit wt 161.8kg  now 139!  Continues to diurese well.  Thighs are softer now.  He is still tense in the LE and likely has another 20# to go. Milrinone decreased yesterday.  Continue current therapy. Life vest will arrive today.  [facility] following.      LOS: 6 days    HAGER, BRYAN 02/21/2012 9:28 AM   Patient seen and examined. Agree with assessment and plan. Pt continues to diurese well; weight loss 49 lbs, but still with volume overload. Will slightly increase ARB therapy to losartan 50 mg in am and 25 mg in pm with aim to increase to 50 bid as tolerates. Will need additional titration of carvedilol perhaps in mid week if hemodynamics allow. Appreciated Dr Bensimhon's assessment.   Troy Sine, MD, [facility] 02/21/2012 10:35 AM

## 2012-02-21 NOTE — Progress Notes (Signed)
For DM: CBGs better, mostly <180 Agreeable to take insulin at home now Continue lantus QHS at bedtime Continue Glipizide at current dose continue insulin teaching From DM standpoint, please send home on  1. Lantus 10 Units QHS 2. Glipizide 5mg  BID 3. Metformin 500mg  BID if creatinine normal at DC I will sign off, please call for concerns/?s   Domenic Polite, MD [phone]

## 2012-02-21 NOTE — Plan of Care (Signed)
Problem: Food- and Nutrition-Related Knowledge Deficit (NB-1.1) Goal: Nutrition education Formal process to instruct or train a patient/client in a skill or to impart knowledge to help patients/clients voluntarily manage or modify food choices and eating behavior to maintain or improve health.  Outcome: Completed/Met Date Met:  02/21/12  RD consulted for nutrition education regarding diabetes.     Lab Results  Component Value Date    HGBA1C 14.3* 02/15/2012    RD provided "Carbohydrate Counting for People with Diabetes" handout from the Academy of [employer] and Dietetics. Discussed different food groups and their effects on blood sugar, emphasizing carbohydrate-containing foods. Provided list of carbohydrates and recommended serving sizes of common foods.  Discussed importance of controlled and consistent carbohydrate intake throughout the day. Provided examples of ways to balance meals/snacks and encouraged intake of high-fiber, whole grain complex carbohydrates.  Pt with limited previous knowledge of carbohydrates and blood sugar. Pt was not counting carbohydrates, was not able to name a food that contains carbohydrates prior to education. RD encouraged pt to read food labels and limit meals to 3-4 carbohydrate servings per meal. Pt with limited participation in education, was able to appropriately answer questions at end of session but RD unsure if pt will make any dietary changes.   Expect fair compliance.  Body mass index is 42.83 kg/(m^2). Pt meets criteria for morbid obesity based on current BMI.  Current diet order is Low Sodium, 1800 ml fluid restriction. Weight is trending down, currently being diuresed. Labs and medications reviewed. No further nutrition interventions warranted at this time. RD contact information provided. If additional nutrition issues arise, please re-consult RD.  Orson Slick RD, LDN Pager [phone] After Hours pager [phone]

## 2012-02-21 NOTE — Progress Notes (Signed)
Patient ID: Joshua Massey, male   DOB: [birth year], 36 y.o.   MRN: [id number] Advanced Heart Failure Rounding Note   Subjective:   Joshua Massey is a 36 year old AA with a history of DM, HTN, and chronic systolic heart failure (diagnosed in 2010 EF 30-35%). He has been followed by Joshua Massey for HTN and heart failure but he has not been seen since 05/2010.   ECHO 02/15/12 EF <20%  02/16/12 RHC/LHC RA - 28  RV - 61/29  PA - 63/18 (31) FCO/CI - 4.28/1.59  TDCO/CI - 3.46/1.28  AO Sat - 94% (room air)  PA Sat - 49% (room air)  LVEDP 28 PVR = 0.8 Woods Moderate tubular mid-LAD lesion, otherwise no significant obstructive CAD.  Started on Milrinone post catherization. 24 hour I/O    Continues to diurese well on milrinone. Now down 49 pounds. (down another 9 pounds overnight)  Breathing better, no orthopnea.  Renal fxn stable, co-ox 68%, CVP remains 15 (measured myself this morning). HR 105-120 at best. Renal function stable.   Pressing to go home soon.  Objective:   Weight Range:  Vital Signs:   Temp:  [97.6 F (36.4 C)-99 F (37.2 C)] 97.6 F (36.4 C) (09/03 0356) Pulse Rate:  [102-105] 102  (09/02 1545) Resp:  [18-20] 20  (09/03 0000) BP: (120-132)/(83-87) 120/83 mmHg (09/03 0400) SpO2:  [96 %-97 %] 96 % (09/03 0356) Weight:  [139.3 kg (307 lb 1.6 oz)] 139.3 kg (307 lb 1.6 oz) (09/03 0458) Last BM Date: 02/15/12  Weight change: Filed Weights   02/19/12 0432 02/20/12 0444 02/21/12 0458  Weight: 149.5 kg (329 lb 9.4 oz) 143.7 kg (316 lb 12.8 oz) 139.3 kg (307 lb 1.6 oz)    Intake/Output:   Intake/Output Summary (Last 24 hours) at 02/21/12 0644 Last data filed at 02/21/12 0600  Gross per 24 hour  Intake 1418.8 ml  Output   5525 ml  Net -4106.2 ml     Physical Exam: General: Well appearing. No resp difficulty  HEENT: normal  Neck: supple. JVP up. Carotids 2+ bilat; no bruits. No lymphadenopathy or thryomegaly appreciated.  Cor: Tachy regular rate & rhythm. No  rubs, gallops or murmurs.  +s3 Lungs: clear . Mild crackles at bases Abdomen:obese, nontender, distended. No hepatosplenomegaly. No bruits or masses. Good bowel sounds.  Extremities: no cyanosis, clubbing.  1-2+ edema 1/2 up lower legs bilaterally.   Neuro: alert & orientedx3, moves all 4 extremities w/o difficulty. Affect pleasant  Telemetry: SR  Labs: Basic Metabolic Panel:  Lab [id number] 0500 02/20/12 0439 02/19/12 0414 02/18/12 0500 02/17/12 0902 02/15/12 0830  NA 137 139 136 138 139 --  K 3.8 3.8 3.7 3.6 3.6 --  CL 101 104 101 104 102 --  CO2 27 28 28 29 27  --  GLUCOSE 108* 166* 198* 199* 164* --  BUN 9 9 9 11 13  --  CREATININE 0.78 0.84 0.89 0.84 0.92 --  CALCIUM 8.9 8.4 8.5 -- -- --  MG -- -- -- -- -- 1.7  PHOS -- -- -- -- -- --    Liver Function Tests:  Lab 02/15/12 0830 02/14/12 2155  AST 24 27  ALT 23 28  ALKPHOS 107 130*  BILITOT 0.8 1.0  PROT 5.6* 6.6  ALBUMIN 2.1* 2.5*   No results found for this basename: LIPASE:5,AMYLASE:5 in the last 168 hours No results found for this basename: AMMONIA:3 in the last 168 hours  CBC:  Lab 02/20/12 0941 02/15/12 1710 02/15/12 0830  02/14/12 2155  WBC 7.4 7.5 6.5 8.4  NEUTROABS -- -- 4.2 6.0  HGB 14.1 13.7 13.5 14.7  HCT 41.3 41.3 40.4 43.3  MCV 86.6 88.2 88.2 87.7  PLT 278 248 245 275    Cardiac Enzymes:  Lab 02/15/12 0830 02/14/12 2155  CKTOTAL 98 --  CKMB 1.8 --  CKMBINDEX -- --  TROPONINI <0.30 <0.30    BNP: BNP (last 3 results)  Basename 02/14/12 2155  PROBNP 2637.0*     Other results:  Imaging: No results found.   Medications:     Scheduled Medications:    . aspirin EC  81 mg Oral Daily  . carvedilol  3.125 mg Oral BID WC  . digoxin  0.25 mg Oral Daily  . enoxaparin (LOVENOX) injection  80 mg Subcutaneous Q24H  . furosemide  40 mg Intravenous BID  . glipiZIDE  5 mg Oral BID AC  . hydrALAZINE  37.5 mg Oral Q8H  . insulin aspart  0-9 Units Subcutaneous TID WC  . insulin glargine  10  Units Subcutaneous QHS  . isosorbide mononitrate  30 mg Oral Daily  . losartan  50 mg Oral Daily  . potassium chloride  40 mEq Oral Daily  . sodium chloride  3 mL Intravenous Q12H  . spironolactone  25 mg Oral Daily  . DISCONTD: enoxaparin (LOVENOX) injection  40 mg Subcutaneous Q24H  . DISCONTD: enoxaparin (LOVENOX) injection  80 mg Subcutaneous Q24H  . DISCONTD: furosemide  80 mg Intravenous BID  . DISCONTD: hydrALAZINE  25 mg Oral Q8H    Infusions:    . sodium chloride 10 mL/hr at 02/17/12 1949  . milrinone 0.25 mcg/kg/min (02/21/12 0443)  . DISCONTD: milrinone 0.25 mcg/kg/min (02/20/12 0900)    PRN Medications: acetaminophen, acetaminophen, acetaminophen, ondansetron (ZOFRAN) IV, ondansetron (ZOFRAN) IV, ondansetron, sodium chloride   Assessment:   1. A/C systolic heart failure EF <20%, nonischemic cardiomyopathy 2. HTN  3. Uncontrolled DM Hgb A1C 14.3  4. Obesity  5. Noncompliance   Plan/Discussion:    Now down 49 pounds but remains fairly tenuous with prominent sinus tachycardia. Lasix cut back yesterday. Still volume overloaded. Co-ox and renal function stable.  Will continue milrinone and lasix for now. Increase hydralazine. Would not push b-blocker just yet.   Will be fitted for LifeVest today. Prognosis remains very tenuous. I told him that he needs to stay until at least Friday (depsite his protestations).   Currently not a candidate for advanced therapies due to body habitus, limited insight, noncompliance, poorly controlled DM2 and RV dysfunction.  Joshua Garza,MD 6:44 AM

## 2012-02-21 NOTE — Progress Notes (Signed)
Inpatient Diabetes Program Recommendations  AACE/ADA: New Consensus Statement on Inpatient Glycemic Control (2013)  Target Ranges:  Prepandial:   less than 140 mg/dL      Peak postprandial:   less than 180 mg/dL (1-2 hours)      Critically ill patients:  140 - 180 mg/dL   Patient prefers to use the insulin pen at home.  Starter-kit has been ordered and patient will be educated on pen use.  Basic bedside inpatient diabetes education has also been ordered for this patient.   Will follow.  Thank you  Raoul Pitch [facility] Inpatient Diabetes Coordinator [phone]

## 2012-02-22 LAB — BASIC METABOLIC PANEL
Calcium: 8.6 mg/dL (ref 8.4–10.5)
Creatinine, Ser: 0.84 mg/dL (ref 0.50–1.35)
GFR calc non Af Amer: >90 mL/min (ref >90)
Sodium: 141 mEq/L (ref 135–145)

## 2012-02-22 LAB — GLUCOSE, CAPILLARY
Glucose-Capillary: 175 mg/dL — ABNORMAL HIGH (ref 70–99)
Glucose-Capillary: 118 mg/dL — ABNORMAL HIGH (ref 70–99)
Glucose-Capillary: 139 mg/dL — ABNORMAL HIGH (ref 70–99)

## 2012-02-22 LAB — CARBOXYHEMOGLOBIN
Carboxyhemoglobin: 1.5 % (ref 0.5–1.5)
Methemoglobin: 1 % (ref 0.0–1.5)
O2 Saturation: 66.1 %
Total hemoglobin: 13.6 g/dL (ref 13.5–18.0)

## 2012-02-22 MED ORDER — FUROSEMIDE 40 MG PO TABS
40.0000 mg | ORAL_TABLET | Freq: Two times a day (BID) | ORAL | Status: DC
  Administered 2012-02-22 – 2012-02-24 (×4): 40 mg via ORAL
  Filled 2012-02-22 (×6): qty 1

## 2012-02-22 MED ORDER — FUROSEMIDE 10 MG/ML IJ SOLN
40.0000 mg | Freq: Once | INTRAMUSCULAR | Status: AC
  Administered 2012-02-22: 40 mg via INTRAVENOUS

## 2012-02-22 MED ORDER — HYDRALAZINE HCL 50 MG PO TABS
75.0000 mg | ORAL_TABLET | Freq: Three times a day (TID) | ORAL | Status: DC
  Administered 2012-02-22 – 2012-02-23 (×3): 75 mg via ORAL
  Filled 2012-02-22 (×6): qty 1

## 2012-02-22 NOTE — Progress Notes (Signed)
[facility]  Subjective: Doing well with no complaints.  Objective: Vital signs in last 24 hours: Temp:  [98.2 F (36.8 C)-99 F (37.2 C)] 98.9 F (37.2 C) (09/04 0801) Pulse Rate:  [117] 117  (09/04 0801) Resp:  [18-22] 22  (09/04 0400) BP: (110-129)/(72-86) 127/83 mmHg (09/04 0801) SpO2:  [93 %-99 %] 94 % (09/04 0801) Weight:  [134.9 kg (297 lb 6.4 oz)] 134.9 kg (297 lb 6.4 oz) (09/04 0500) Last BM Date: 02/15/12  Intake/Output from previous day: 09/03 0701 - 09/04 0700 In: 1117.2 [P.O.:600; I.V.:509.2; IV Piggyback:8] Out: [id number] [Urine:5725] Intake/Output this shift:    Medications Current Facility-Administered Medications  Medication Dose Route Frequency Provider Last Rate Last Dose  . 0.9 %  sodium chloride infusion   Intravenous Continuous Jolaine Artist, MD 10 mL/hr at 02/17/12 1949    . acetaminophen (TYLENOL) tablet 650 mg  650 mg Oral Q6H PRN Rise Patience, MD   650 mg at 02/19/12 1539   Or  . acetaminophen (TYLENOL) suppository 650 mg  650 mg Rectal Q6H PRN Rise Patience, MD      . acetaminophen (TYLENOL) tablet 650 mg  650 mg Oral Q4H PRN Pixie Casino, MD   650 mg at 02/20/12 1401  . aspirin EC tablet 81 mg  81 mg Oral Daily Jolaine Artist, MD   81 mg at 02/21/12 0849  . carvedilol (COREG) tablet 3.125 mg  3.125 mg Oral BID WC Jolaine Artist, MD   3.125 mg at 02/21/12 1720  . digoxin (LANOXIN) tablet 0.25 mg  0.25 mg Oral Daily Jolaine Artist, MD   0.25 mg at 02/21/12 0849  . enoxaparin (LOVENOX) injection 80 mg  80 mg Subcutaneous Q24H Jolaine Artist, MD   80 mg at 02/22/12 0517  . Flexpen Starter Kit 1 kit  1 kit Other Once Pixie Casino, MD      . furosemide (LASIX) injection 40 mg  40 mg Intravenous BID Jolaine Artist, MD   40 mg at 02/21/12 1720  . glipiZIDE (GLUCOTROL) tablet 5 mg  5 mg Oral BID AC Domenic Polite, MD   5 mg at 02/21/12 1720  . hydrALAZINE (APRESOLINE) tablet 50 mg  50  mg Oral Q8H Jolaine Artist, MD   50 mg at 02/22/12 0517  . insulin aspart (novoLOG) injection 0-9 Units  0-9 Units Subcutaneous TID WC Rise Patience, MD   1 Units at 02/21/12 1720  . insulin glargine (LANTUS) injection 10 Units  10 Units Subcutaneous QHS Domenic Polite, MD   10 Units at 02/21/12 2131  . isosorbide mononitrate (IMDUR) 24 hr tablet 30 mg  30 mg Oral BID Jolaine Artist, MD   30 mg at 02/21/12 2131  . living well with diabetes book MISC   Does not apply Once Domenic Polite, MD      . losartan (COZAAR) tablet 25 mg  25 mg Oral QHS Troy Sine, MD   25 mg at 02/21/12 2131  . losartan (COZAAR) tablet 50 mg  50 mg Oral Daily Jolaine Artist, MD   50 mg at 02/21/12 0849  . milrinone [facility]) infusion 200 mcg/mL (0.2 mg/mL)  0.25 mcg/kg/min Intravenous Continuous Jolaine Artist, MD 10.8 mL/hr at 02/21/12 2132 0.25 mcg/kg/min at 02/21/12 2132  . ondansetron (ZOFRAN) tablet 4 mg  4 mg Oral Q6H PRN Rise Patience, MD       Or  . ondansetron [facility])  injection 4 mg  4 mg Intravenous Q6H PRN Rise Patience, MD      . ondansetron [facility]) injection 4 mg  4 mg Intravenous Q6H PRN Pixie Casino, MD      . potassium chloride SA (K-DUR,KLOR-CON) CR tablet 40 mEq  40 mEq Oral Daily Amy D Clegg, NP   40 mEq at 02/21/12 0848  . sodium chloride 0.9 % injection 10-40 mL  10-40 mL Intracatheter PRN Pixie Casino, MD   10 mL at 02/21/12 2132  . sodium chloride 0.9 % injection 3 mL  3 mL Intravenous Q12H Rise Patience, MD   3 mL at 02/20/12 2118  . spironolactone (ALDACTONE) tablet 25 mg  25 mg Oral Daily Jolaine Artist, MD   25 mg at 02/21/12 0849  . DISCONTD: bd getting started take home kit 3/10 ml X 30g syringes 1 kit  1 kit Other Once Pixie Casino, MD      . DISCONTD: Casey Burkitt Starter Kit 1 kit  1 kit Other Once Domenic Polite, MD        PE: General appearance: alert, cooperative and no distress Lungs: clear to auscultation bilaterally Heart:  reg rhythm,  tachycardia. no MM Extremities: 2-3+ LEE Pulses: 2+ and symmetric Skin: Warm and dry Neurologic: Grossly normal  Lab Results:   Basename 02/20/12 0941  WBC 7.4  HGB 14.1  HCT 41.3  PLT 278   BMET  Basename 02/22/12 0455 02/21/12 0500 02/20/12 0439  NA 141 137 139  K 3.9 3.8 3.8  CL 107 101 104  CO2 27 27 28   GLUCOSE 117* 108* 166*  BUN 9 9 9   CREATININE 0.84 0.78 0.84  CALCIUM 8.6 8.9 8.4    Assessment/Plan  Principal Problem:  *CHF (congestive heart failure) Active Problems:  NSVT (nonsustained ventricular tachycardia)  DM2 (diabetes mellitus, type 2)  Hypertension  Cardiomyopathy:  EF<20%  02/15/2012  Obesity  Acute on chronic systolic heart failure  Cardiogenic shock  Plan:  Another 4.6 L negative yesterday.  His lower extremities are finally beginning to soften up.  SCr is stable.  Probably 5-10# to go.   I spoke to [place] with [facility] and the Vest will be here tonight.  We should consider stopping milrinone.  Recommend one more day of IV lasix then PO.  Possibly home Friday.  Discussed wt mgt and low sodium diet.   LOS: 7 days    HAGER, BRYAN 02/22/2012 8:21 AM   Agree with note written by Luisa Dago PAC  NISCM on IV Milrinone with excellent diuresis. On appropriate meds. Appreciate CHF service's help. Agree with LifeVest. I'm not sure that he has good insight into his medical problems and compliance.  Lorretta Harp 02/22/2012 11:45 AM

## 2012-02-22 NOTE — Progress Notes (Signed)
Nutrition Brief Note  Patient identified on the Malnutrition Screening Tool (MST) report for unintentional weight loss, generating a score of 2. Pt reports some intentional weight loss as well as unintentional from not eating more then one meal and some snacks. Pt reports appetite has been normal, but was very busy and unable to eat often. Pt reports adequate intake at this time.    Body mass index is 41.48 kg/(m^2). Pt meets criteria for Morbid obesity based on current BMI.   Current diet order is low sodium, patient is consuming approximately 100% of meals at this time. Labs and medications reviewed.   Pt states he has no questions about diet education completed yesterday.   No nutrition interventions warranted at this time. If nutrition issues arise, please consult RD.   Orson Slick RD, LDN Pager [phone] After Hours pager [phone]

## 2012-02-22 NOTE — Progress Notes (Addendum)
Advanced Heart Failure Rounding Note   Subjective:   Joshua Massey is a 36 year old AA with a history of DM, HTN, and chronic systolic heart failure (diagnosed in 2010 EF 30-35%). He has been followed by Dr Rollene Fare for HTN and heart failure but he has not been seen since 05/2010.   ECHO 02/15/12 EF <20%  02/16/12 RHC/LHC RA - 28  RV - 61/29  PA - 63/18 (31) FCO/CI - 4.28/1.59  TDCO/CI - 3.46/1.28  AO Sat - 94% (room air)  PA Sat - 49% (room air)  LVEDP 28 PVR = 0.8 Woods Moderate tubular mid-LAD lesion, otherwise no significant obstructive CAD.  Started on Milrinone post catherization. 24 hour I/O    Continues to diurese well on milrinone. Now down 59 pounds. (down another 10 pounds overnight)  Breathing better, no orthopnea.  Ambulated in halls yesterday without difficulty.    Renal fxn stable, co-ox 66%, CVP remains 10 (measured myself this morning). HR 105-120 at best.   Objective:   Weight Range:  Vital Signs:   Temp:  [98.2 F (36.8 C)-99 F (37.2 C)] 98.8 F (37.1 C) (09/04 0359) Resp:  [18-22] 22  (09/04 0400) BP: (110-129)/(72-86) 120/73 mmHg (09/04 0400) SpO2:  [93 %-99 %] 96 % (09/04 0359) Weight:  [134.9 kg (297 lb 6.4 oz)] 134.9 kg (297 lb 6.4 oz) (09/04 0500) Last BM Date: 02/15/12  Weight change: Filed Weights   02/20/12 0444 02/21/12 0458 02/22/12 0500  Weight: 143.7 kg (316 lb 12.8 oz) 139.3 kg (307 lb 1.6 oz) 134.9 kg (297 lb 6.4 oz)    Intake/Output:   Intake/Output Summary (Last 24 hours) at 02/22/12 0737 Last data filed at 02/22/12 0700  Gross per 24 hour  Intake 1117.2 ml  Output   5725 ml  Net -4607.8 ml     Physical Exam: General: Well appearing. No resp difficulty  HEENT: normal  Neck: supple. JVP 8-9. Carotids 2+ bilat; no bruits. No lymphadenopathy or thryomegaly appreciated.  Cor: Tachy regular rate & rhythm. No rubs, gallops or murmurs.  +s3 Lungs: clear  Abdomen:obese, nontender, distended. No hepatosplenomegaly. No bruits or  masses. Good bowel sounds.  Extremities: no cyanosis, clubbing.  Trace-1+.   Neuro: alert & orientedx3, moves all 4 extremities w/o difficulty. Affect pleasant  Telemetry: SR 105  Labs: Basic Metabolic Panel:  Lab [id number] 0455 02/21/12 0500 02/20/12 0439 02/19/12 0414 02/18/12 0500 02/15/12 0830  NA 141 137 139 136 138 --  K 3.9 3.8 3.8 3.7 3.6 --  CL 107 101 104 101 104 --  CO2 27 27 28 28 29  --  GLUCOSE 117* 108* 166* 198* 199* --  BUN 9 9 9 9 11  --  CREATININE 0.84 0.78 0.84 0.89 0.84 --  CALCIUM 8.6 8.9 8.4 -- -- --  MG -- -- -- -- -- 1.7  PHOS -- -- -- -- -- --    Liver Function Tests:  Lab 02/15/12 0830  AST 24  ALT 23  ALKPHOS 107  BILITOT 0.8  PROT 5.6*  ALBUMIN 2.1*   No results found for this basename: LIPASE:5,AMYLASE:5 in the last 168 hours No results found for this basename: AMMONIA:3 in the last 168 hours  CBC:  Lab 02/20/12 0941 02/15/12 1710 02/15/12 0830  WBC 7.4 7.5 6.5  NEUTROABS -- -- 4.2  HGB 14.1 13.7 13.5  HCT 41.3 41.3 40.4  MCV 86.6 88.2 88.2  PLT 278 248 245    Cardiac Enzymes:  Lab 02/15/12 0830  CKTOTAL  98  CKMB 1.8  CKMBINDEX --  TROPONINI <0.30    BNP: BNP (last 3 results)  Basename 02/14/12 2155  PROBNP 2637.0*     Other results:  Imaging: No results found.   Medications:     Scheduled Medications:    . aspirin EC  81 mg Oral Daily  . carvedilol  3.125 mg Oral BID WC  . digoxin  0.25 mg Oral Daily  . enoxaparin (LOVENOX) injection  80 mg Subcutaneous Q24H  . Flexpen Starter Kit  1 kit Other Once  . furosemide  40 mg Intravenous BID  . glipiZIDE  5 mg Oral BID AC  . hydrALAZINE  50 mg Oral Q8H  . insulin aspart  0-9 Units Subcutaneous TID WC  . insulin glargine  10 Units Subcutaneous QHS  . isosorbide mononitrate  30 mg Oral BID  . living well with diabetes book   Does not apply Once  . losartan  25 mg Oral QHS  . losartan  50 mg Oral Daily  . potassium chloride  40 mEq Oral Daily  . sodium  chloride  3 mL Intravenous Q12H  . spironolactone  25 mg Oral Daily  . DISCONTD: bd getting started take home kit  1 kit Other Once  . DISCONTD: Flexpen Starter Kit  1 kit Other Once    Infusions:    . sodium chloride 10 mL/hr at 02/17/12 1949  . milrinone 0.25 mcg/kg/min (02/21/12 2132)    PRN Medications: acetaminophen, acetaminophen, acetaminophen, ondansetron (ZOFRAN) IV, ondansetron (ZOFRAN) IV, ondansetron, sodium chloride   Assessment:   1. A/C systolic heart failure EF <20%, nonischemic cardiomyopathy 2. HTN  3. Uncontrolled DM Hgb A1C 14.3  4. Obesity  5. Noncompliance   Plan/Discussion:    Weight down 59 pounds.  CVP 10 so will transition lasix to po today.  Renal function remains stable.  Will increase hydralazine for afterload reduction in setting of sinus tach.  Will start to wean milrinone today.  Hold off on titration of beta blocker at this time. Hopefully home by Friday.  To be fitted for LifeVest today. Prognosis remains very tenuous.   Currently not a candidate for advanced therapies due to body habitus, limited insight, noncompliance, poorly controlled DM2 and RV dysfunction.   Joshua Massey, [facility]  8:13 AM  Patient seen and examined with Joshua Haven, PA-C. We discussed all aspects of the encounter. I agree with the assessment and plan as stated above.   Continues to diurese well and will start to wean milrinone and change lasix to po. Unfortunately he continues to be quite tachycardic with prominent S3 so I remain very concerned about him but he is not a candidate for advanced therapies at this point due to above issues.   Daniel Bensimhon,MD 8:29 AM

## 2012-02-23 DIAGNOSIS — Z8249 Family history of ischemic heart disease and other diseases of the circulatory system: Secondary | ICD-10-CM

## 2012-02-23 DIAGNOSIS — I2510 Atherosclerotic heart disease of native coronary artery without angina pectoris: Secondary | ICD-10-CM | POA: Diagnosis present

## 2012-02-23 LAB — BASIC METABOLIC PANEL
CO2: 26 mEq/L (ref 19–32)
Chloride: 103 mEq/L (ref 96–112)
Creatinine, Ser: 0.77 mg/dL (ref 0.50–1.35)
Glucose, Bld: 112 mg/dL — ABNORMAL HIGH (ref 70–99)

## 2012-02-23 LAB — GLUCOSE, CAPILLARY
Glucose-Capillary: 205 mg/dL — ABNORMAL HIGH (ref 70–99)
Glucose-Capillary: 141 mg/dL — ABNORMAL HIGH (ref 70–99)

## 2012-02-23 LAB — CARBOXYHEMOGLOBIN
Methemoglobin: 0.6 % (ref 0.0–1.5)
Total hemoglobin: 13.4 g/dL — ABNORMAL LOW (ref 13.5–18.0)

## 2012-02-23 MED ORDER — ENOXAPARIN SODIUM 80 MG/0.8ML ~~LOC~~ SOLN
65.0000 mg | Freq: Every day | SUBCUTANEOUS | Status: DC
  Administered 2012-02-24: 65 mg via SUBCUTANEOUS
  Filled 2012-02-23: qty 0.8

## 2012-02-23 MED ORDER — HYDRALAZINE HCL 50 MG PO TABS
100.0000 mg | ORAL_TABLET | Freq: Three times a day (TID) | ORAL | Status: DC
  Administered 2012-02-23 – 2012-02-24 (×3): 100 mg via ORAL
  Filled 2012-02-23 (×6): qty 2

## 2012-02-23 NOTE — Progress Notes (Addendum)
Advanced Heart Failure Rounding Note   Subjective:   Mr Phetteplace is a 36 year old AA with a history of DM, HTN, and chronic systolic heart failure (diagnosed in 2010 EF 30-35%). He has been followed by Dr Rollene Fare for HTN and heart failure but he has not been seen since 05/2010.   ECHO 02/15/12 EF <20%  02/16/12 RHC/LHC RA - 28  RV - 61/29  PA - 63/18 (31) FCO/CI - 4.28/1.59  TDCO/CI - 3.46/1.28  AO Sat - 94% (room air)  PA Sat - 49% (room air)  LVEDP 28 PVR = 0.8 Woods Moderate tubular mid-LAD lesion, otherwise no significant obstructive CAD.  Started on Milrinone post catherization. 24 hour I/O    Milrinone starting to be weaned yesterday.  Now down 63 pounds. (down another 4 pounds overnight)  No dyspnea, no orthopnea.  Ambulated in halls yesterday without difficulty.    Renal fxn stable, co-ox 64%, CVP remains 4-6. HR 105-120.   Objective:   Weight Range:  Vital Signs:   Temp:  [98.7 F (37.1 C)-99.4 F (37.4 C)] 99.3 F (37.4 C) (09/05 0400) Pulse Rate:  [117-120] 120  (09/04 1124) Resp:  [16-18] 18  (09/05 0400) BP: (115-127)/(76-87) 115/76 mmHg (09/05 0400) SpO2:  [94 %-98 %] 98 % (09/05 0400) Weight:  [132.995 kg (293 lb 3.2 oz)] 132.995 kg (293 lb 3.2 oz) (09/05 0500) Last BM Date: 02/15/12  Weight change: Filed Weights   02/21/12 0458 02/22/12 0500 02/23/12 0500  Weight: 139.3 kg (307 lb 1.6 oz) 134.9 kg (297 lb 6.4 oz) 132.995 kg (293 lb 3.2 oz)    Intake/Output:   Intake/Output Summary (Last 24 hours) at 02/23/12 0706 Last data filed at 02/23/12 0600  Gross per 24 hour  Intake 1085.85 ml  Output   2700 ml  Net -1614.15 ml     Physical Exam: General: Well appearing. No resp difficulty  HEENT: normal  Neck: supple. JVP 8-9. Carotids 2+ bilat; no bruits. No lymphadenopathy or thryomegaly appreciated.  Cor: Tachy regular rate & rhythm. No rubs, gallops or murmurs.  +s3 Lungs: clear  Abdomen:obese, nontender, distended. No hepatosplenomegaly. No  bruits or masses. Good bowel sounds.  Extremities: no cyanosis, clubbing.  Trace edema   Neuro: alert & orientedx3, moves all 4 extremities w/o difficulty. Affect pleasant  Telemetry: SR 105  Labs: Basic Metabolic Panel:  Lab [id number] 0450 02/22/12 0455 02/21/12 0500 02/20/12 0439 02/19/12 0414  NA 137 141 137 139 136  K 3.9 3.9 3.8 3.8 3.7  CL 103 107 101 104 101  CO2 26 27 27 28 28   GLUCOSE 112* 117* 108* 166* 198*  BUN 10 9 9 9 9   CREATININE 0.77 0.84 0.78 0.84 0.89  CALCIUM 8.7 8.6 8.9 -- --  MG -- -- -- -- --  PHOS -- -- -- -- --    Liver Function Tests: No results found for this basename: AST:5,ALT:5,ALKPHOS:5,BILITOT:5,PROT:5,ALBUMIN:5 in the last 168 hours No results found for this basename: LIPASE:5,AMYLASE:5 in the last 168 hours No results found for this basename: AMMONIA:3 in the last 168 hours  CBC:  Lab 02/20/12 0941  WBC 7.4  NEUTROABS --  HGB 14.1  HCT 41.3  MCV 86.6  PLT 278    Cardiac Enzymes: No results found for this basename: CKTOTAL:5,CKMB:5,CKMBINDEX:5,TROPONINI:5 in the last 168 hours  BNP: BNP (last 3 results)  Basename 02/14/12 2155  PROBNP 2637.0*     Other results:  Imaging: No results found.   Medications:  Scheduled Medications:    . aspirin EC  81 mg Oral Daily  . carvedilol  3.125 mg Oral BID WC  . digoxin  0.25 mg Oral Daily  . enoxaparin (LOVENOX) injection  80 mg Subcutaneous Q24H  . Flexpen Starter Kit  1 kit Other Once  . furosemide  40 mg Intravenous Once  . furosemide  40 mg Oral BID  . glipiZIDE  5 mg Oral BID AC  . hydrALAZINE  75 mg Oral Q8H  . insulin aspart  0-9 Units Subcutaneous TID WC  . insulin glargine  10 Units Subcutaneous QHS  . isosorbide mononitrate  30 mg Oral BID  . losartan  25 mg Oral QHS  . losartan  50 mg Oral Daily  . potassium chloride  40 mEq Oral Daily  . sodium chloride  3 mL Intravenous Q12H  . spironolactone  25 mg Oral Daily  . DISCONTD: furosemide  40 mg Intravenous  BID  . DISCONTD: hydrALAZINE  50 mg Oral Q8H    Infusions:    . sodium chloride 10 mL/hr at 02/17/12 1949  . milrinone 0.125 mcg/kg/min (02/23/12 0300)    PRN Medications: acetaminophen, acetaminophen, acetaminophen, ondansetron (ZOFRAN) IV, ondansetron (ZOFRAN) IV, ondansetron, sodium chloride   Assessment:   1. A/C systolic heart failure EF <20%, nonischemic cardiomyopathy 2. HTN  3. Uncontrolled DM Hgb A1C 14.3  4. Obesity  5. Noncompliance   Plan/Discussion:    Weight stable on oral diuretics.  Will continue current dose of lasix.  Increase hydralazine for afterload reduction in sinus tach.  Stop milrinone today.  No further titration of beta blocker, can titrate at follow up.    To be fitted for LifeVest today. Prognosis remains very tenuous.   Currently not a candidate for advanced therapies due to body habitus, limited insight, noncompliance, poorly controlled DM2 and RV dysfunction.   Teressa Senter, [facility]  7:06 AM  Patient seen with PA, agree with the above note.  Stopping milrinone today, will see how he tolerates this.  If does well, home tomorrow with LifeVest.   Loralie Champagne 02/23/2012 12:56 PM

## 2012-02-23 NOTE — Progress Notes (Signed)
Pt. Seen and examined. Agree with the NP/PA-C note as written.  Continues to diurese well. Wean milrinone to off today. Continue afterload reduction and po diuretics. LifeVest. Dig level was undetectable and he has a large volume of distribution, consider increased dose of 0.375 mg daily for inotropic benefit? Appreciate HF service input.   Pixie Casino, MD, [facility] Attending Cardiologist [facility] [place] [facility]

## 2012-02-23 NOTE — Progress Notes (Signed)
Subjective:  No SOB  Objective:  Vital Signs in the last 24 hours: Temp:  [98.7 F (37.1 C)-99.4 F (37.4 C)] 99.3 F (37.4 C) (09/05 0400) Pulse Rate:  [120] 120  (09/04 1124) Resp:  [16-18] 18  (09/05 0400) BP: (115-126)/(76-87) 115/76 mmHg (09/05 0400) SpO2:  [95 %-98 %] 98 % (09/05 0400) Weight:  [132.995 kg (293 lb 3.2 oz)] 132.995 kg (293 lb 3.2 oz) (09/05 0500)  Intake/Output from previous day:  Intake/Output Summary (Last 24 hours) at 02/23/12 0836 Last data filed at 02/23/12 0700  Gross per 24 hour  Intake 1080.45 ml  Output   2700 ml  Net -1619.55 ml    Physical Exam: General appearance: alert, cooperative and no distress Lungs: basilar crackles Heart: regular rate and rhythm   Rate: 95  Rhythm: normal sinus rhythm  Lab Results:  Basename 02/20/12 0941  WBC 7.4  HGB 14.1  PLT 278    Basename 02/23/12 0450 02/22/12 0455  NA 137 141  K 3.9 3.9  CL 103 107  CO2 26 27  GLUCOSE 112* 117*  BUN 10 9  CREATININE 0.77 0.84   No results found for this basename: TROPONINI:2,CK,MB:2 in the last 72 hours Hepatic Function Panel No results found for this basename: PROT,ALBUMIN,AST,ALT,ALKPHOS,BILITOT,BILIDIR,IBILI in the last 72 hours No results found for this basename: CHOL in the last 72 hours No results found for this basename: INR in the last 72 hours  Imaging: Imaging results have been reviewed  Cardiac Studies:  Assessment/Plan:   Principal Problem:  *Acute on chronic systolic heart failure Active Problems:  Non-ischemic cardiomyopathy, EF < 20%  DM2 (diabetes mellitus, type 2)  Hypertension  NSVT (nonsustained ventricular tachycardia)  Obesity  Coronary artery disease, 50% LAD at cath 02/16/12  Family history of coronary artery disease  Plan- Diuresing on Milrinone and po Lasix. Watch K+ as he is on Aldactone and K+ supplement. Check BNP in am. Lifevest at discharge. ?Stop Milrinone today, shoot for discharge in am    Kerin Ransom  PA-C 02/23/2012, 8:36 AM

## 2012-02-24 LAB — BASIC METABOLIC PANEL
BUN: 9 mg/dL (ref 6–23)
CO2: 25 mEq/L (ref 19–32)
Chloride: 101 mEq/L (ref 96–112)
Creatinine, Ser: 0.73 mg/dL (ref 0.50–1.35)
GFR calc Af Amer: >90 mL/min (ref >90)

## 2012-02-24 LAB — PRO B NATRIURETIC PEPTIDE: Pro B Natriuretic peptide (BNP): 1531 pg/mL — ABNORMAL HIGH (ref 0–125)

## 2012-02-24 LAB — GLUCOSE, CAPILLARY: Glucose-Capillary: 128 mg/dL — ABNORMAL HIGH (ref 70–99)

## 2012-02-24 LAB — CARBOXYHEMOGLOBIN: Methemoglobin: 1 % (ref 0.0–1.5)

## 2012-02-24 MED ORDER — CARVEDILOL 12.5 MG PO TABS: 6.2500 mg | ORAL_TABLET | Freq: Two times a day (BID) | ORAL | Status: DC

## 2012-02-24 MED ORDER — ASPIRIN 81 MG PO TBEC: 81.0000 mg | DELAYED_RELEASE_TABLET | Freq: Every day | ORAL | Status: AC

## 2012-02-24 MED ORDER — GLIPIZIDE 5 MG PO TABS: 5.0000 mg | ORAL_TABLET | Freq: Two times a day (BID) | ORAL | Status: DC

## 2012-02-24 MED ORDER — ISOSORBIDE MONONITRATE ER 30 MG PO TB24: 30.0000 mg | ORAL_TABLET | Freq: Every day | ORAL | Status: DC

## 2012-02-24 MED ORDER — LOSARTAN POTASSIUM 50 MG PO TABS
50.0000 mg | ORAL_TABLET | Freq: Every day | ORAL | Status: DC
  Filled 2012-02-24: qty 1

## 2012-02-24 MED ORDER — SPIRONOLACTONE 25 MG PO TABS: 25.0000 mg | ORAL_TABLET | Freq: Every day | ORAL | Status: DC

## 2012-02-24 MED ORDER — HYDRALAZINE HCL 50 MG PO TABS
50.0000 mg | ORAL_TABLET | Freq: Two times a day (BID) | ORAL | Status: DC
  Filled 2012-02-24: qty 1

## 2012-02-24 MED ORDER — CARVEDILOL 6.25 MG PO TABS
6.2500 mg | ORAL_TABLET | Freq: Two times a day (BID) | ORAL | Status: DC
  Filled 2012-02-24 (×2): qty 1

## 2012-02-24 MED ORDER — HYDRALAZINE HCL 50 MG PO TABS: 50.0000 mg | ORAL_TABLET | Freq: Two times a day (BID) | ORAL | Status: DC

## 2012-02-24 MED ORDER — ISOSORBIDE MONONITRATE ER 30 MG PO TB24
30.0000 mg | ORAL_TABLET | Freq: Every day | ORAL | Status: DC
  Administered 2012-02-24: 30 mg via ORAL
  Filled 2012-02-24: qty 1

## 2012-02-24 MED ORDER — LOSARTAN POTASSIUM 50 MG PO TABS: 50.0000 mg | ORAL_TABLET | Freq: Two times a day (BID) | ORAL | Status: DC

## 2012-02-24 MED ORDER — DIGOXIN 250 MCG PO TABS: 0.2500 mg | ORAL_TABLET | Freq: Every day | ORAL | Status: DC

## 2012-02-24 NOTE — Progress Notes (Addendum)
Advanced Heart Failure Rounding Note   Subjective:   [name] is a 36 year old AA with a history of DM, HTN, and chronic systolic heart failure (diagnosed in 2010 EF 30-35%). He has been followed by Dr Rollene Fare for HTN and heart failure but he has not been seen since 05/2010.   ECHO 02/15/12 EF <20%  02/16/12 RHC/LHC RA - 28  RV - 61/29  PA - 63/18 (31) FCO/CI - 4.28/1.59  TDCO/CI - 3.46/1.28  AO Sat - 94% (room air)  PA Sat - 49% (room air)  LVEDP 28 PVR = 0.8 Woods Moderate tubular mid-LAD lesion, otherwise no significant obstructive CAD.  Started on Milrinone post catherization. 24 hour I/O    Milrinone weaned to off.  Now down 66 pounds. (down another 3 pounds overnight)  No dyspnea, no orthopnea.  Ambulated in halls yesterday without difficulty.  Ready to go.    Renal fxn stable, co-ox 65%, CVP remains 11-12. HR 100-120.   Objective:   Weight Range:  Vital Signs:   Temp:  [98.1 F (36.7 C)-98.9 F (37.2 C)] 98.7 F (37.1 C) (09/06 0323) Pulse Rate:  [96-113] 96  (09/05 2340) Resp:  [18] 18  (09/05 1917) BP: (114-130)/(78-90) 119/79 mmHg (09/06 0615) SpO2:  [96 %-100 %] 98 % (09/05 2340) Weight:  [131.8 kg (290 lb 9.1 oz)] 131.8 kg (290 lb 9.1 oz) (09/06 0400) Last BM Date: 02/15/12  Weight change: Filed Weights   02/22/12 0500 02/23/12 0500 02/24/12 0400  Weight: 134.9 kg (297 lb 6.4 oz) 132.995 kg (293 lb 3.2 oz) 131.8 kg (290 lb 9.1 oz)    Intake/Output:   Intake/Output Summary (Last 24 hours) at 02/24/12 0709 Last data filed at 02/24/12 0600  Gross per 24 hour  Intake  840.8 ml  Output   1700 ml  Net -859.2 ml     Physical Exam: General: Well appearing. No resp difficulty  HEENT: normal  Neck: supple. JVP 8-9. Carotids 2+ bilat; no bruits. No lymphadenopathy or thryomegaly appreciated.  Cor: Tachy regular rate & rhythm. No rubs, gallops or murmurs.  +s3 Lungs: clear  Abdomen:obese, nontender, nondistended. No hepatosplenomegaly. No bruits or  masses. Good bowel sounds.  Extremities: no cyanosis, clubbing.  Trace edema   Neuro: alert & orientedx3, moves all 4 extremities w/o difficulty. Affect pleasant  Telemetry: SR 105  Labs: Basic Metabolic Panel:  Lab 0000000 0500 02/23/12 0450 02/22/12 0455 02/21/12 0500 02/20/12 0439  NA 136 137 141 137 139  K 4.8 3.9 3.9 3.8 3.8  CL 101 103 107 101 104  CO2 25 26 27 27 28   GLUCOSE 101* 112* 117* 108* 166*  BUN 9 10 9 9 9   CREATININE 0.73 0.77 0.84 0.78 0.84  CALCIUM 9.1 8.7 8.6 -- --  MG -- -- -- -- --  PHOS -- -- -- -- --    Liver Function Tests: No results found for this basename: AST:5,ALT:5,ALKPHOS:5,BILITOT:5,PROT:5,ALBUMIN:5 in the last 168 hours No results found for this basename: LIPASE:5,AMYLASE:5 in the last 168 hours No results found for this basename: AMMONIA:3 in the last 168 hours  CBC:  Lab 02/20/12 0941  WBC 7.4  NEUTROABS --  HGB 14.1  HCT 41.3  MCV 86.6  PLT 278    Cardiac Enzymes: No results found for this basename: CKTOTAL:5,CKMB:5,CKMBINDEX:5,TROPONINI:5 in the last 168 hours  BNP: BNP (last 3 results)  Basename 02/14/12 2155  PROBNP 2637.0*     Other results:  Imaging: No results found.   Medications:  Scheduled Medications:    . aspirin EC  81 mg Oral Daily  . carvedilol  3.125 mg Oral BID WC  . digoxin  0.25 mg Oral Daily  . enoxaparin (LOVENOX) injection  65 mg Subcutaneous Daily  . furosemide  40 mg Oral BID  . glipiZIDE  5 mg Oral BID AC  . hydrALAZINE  100 mg Oral Q8H  . insulin aspart  0-9 Units Subcutaneous TID WC  . insulin glargine  10 Units Subcutaneous QHS  . isosorbide mononitrate  30 mg Oral BID  . losartan  25 mg Oral QHS  . losartan  50 mg Oral Daily  . potassium chloride  40 mEq Oral Daily  . sodium chloride  3 mL Intravenous Q12H  . spironolactone  25 mg Oral Daily  . DISCONTD: enoxaparin (LOVENOX) injection  80 mg Subcutaneous Q24H  . DISCONTD: hydrALAZINE  75 mg Oral Q8H    Infusions:     . sodium chloride 10 mL/hr at 02/23/12 1000  . DISCONTD: milrinone Stopped (02/23/12 0959)    PRN Medications: acetaminophen, acetaminophen, acetaminophen, ondansetron (ZOFRAN) IV, ondansetron (ZOFRAN) IV, ondansetron, sodium chloride   Assessment:   1. A/C systolic heart failure EF <20%, nonischemic cardiomyopathy 2. HTN  3. Uncontrolled DM Hgb A1C 14.3  4. Obesity  5. Noncompliance   Plan/Discussion:    Weight stable on oral diuretics and off milrinone.  Will continue current therapy.  Will continue to titrate at follow up appointment.    Fitted for LifeVest yesterday.  Prognosis remains tenuous.   Currently not a candidate for advanced therapies due to body habitus, limited insight, noncompliance, poorly controlled DM2 and RV dysfunction.  From heart failure stand point ok to add metformin if refuses insulin per diabetes recommendations.   Recommend: Sprio 25 mg daily Lasix 40 mg BID Carvedilol 3.125 mg BID Losartan 100 mg daily Hydralazine 100 mg TID Imdur 30 mg BID Digoxin 0.25 mg daily  Follow up next week in HF clinic.  Teressa Senter, [facility]  7:09 AM  Agree with the above plan as presented by PA Leory Plowman.  To followup in CHF clinic.   Loralie Champagne 02/24/2012 9:33 AM

## 2012-02-24 NOTE — Progress Notes (Signed)
Subjective:  No SOB  Objective:  Vital Signs in the last 24 hours: Temp:  [98.4 F (36.9 C)-98.9 F (37.2 C)] 98.6 F (37 C) (09/06 0725) Pulse Rate:  [96-113] 109  (09/06 0725) Resp:  [18] 18  (09/06 0725) BP: (112-130)/(78-90) 112/78 mmHg (09/06 0757) SpO2:  [96 %-100 %] 96 % (09/06 0725) Weight:  [131.8 kg (290 lb 9.1 oz)] 131.8 kg (290 lb 9.1 oz) (09/06 0400)  Intake/Output from previous day:  Intake/Output Summary (Last 24 hours) at 02/24/12 0815 Last data filed at 02/24/12 0600  Gross per 24 hour  Intake  705.4 ml  Output   1700 ml  Net -994.6 ml    Physical Exam: General appearance: alert, cooperative and no distress Lungs: clear to auscultation bilaterally Heart: regularly irregular rhythm   Rate: 108  Rhythm: sinus tachycardia  Lab Results: No results found for this basename: WBC:2,HGB:2,PLT:2 in the last 72 hours  Basename 02/24/12 0500 02/23/12 0450  NA 136 137  K 4.8 3.9  CL 101 103  CO2 25 26  GLUCOSE 101* 112*  BUN 9 10  CREATININE 0.73 0.77   No results found for this basename: TROPONINI:2,CK,MB:2 in the last 72 hours Hepatic Function Panel No results found for this basename: PROT,ALBUMIN,AST,ALT,ALKPHOS,BILITOT,BILIDIR,IBILI in the last 72 hours No results found for this basename: CHOL in the last 72 hours No results found for this basename: INR in the last 72 hours  Imaging: No results found.  Cardiac Studies:  Assessment/Plan:   Principal Problem:  *Acute on chronic systolic heart failure Active Problems:  Non-ischemic cardiomyopathy, EF < 20%  DM2 (diabetes mellitus, type 2)  Hypertension  NSVT (nonsustained ventricular tachycardia)  Obesity  Coronary artery disease, 50% LAD at cath 02/16/12  Family history of coronary artery disease  Plan- Stop K+, check BNP this am, increase Coreg,  ? home later today. We can probably stop Hydralazine and Nitrates and increase Cozaar and Coreg  for B/P. Add Norvasc as an OP if needed. He will  need early follow up.    Kerin Ransom PA-C 02/24/2012, 8:15 AM   I have seen and examined the patient along with Kerin Ransom PA-C.  I have reviewed the chart, notes and new data.  I agree with PAP's note.  Lying completely supine, no dyspnea. Mild resting tachycardia. He is a full 66 lb lighter than when admitted. Currently 290 lb. No edema and no appreciable JVD. No rales. S3 soft and intermittent.  PLAN: Recheck proBNP for "baseline". His renal function parameters are still normal, but I think we are close to his dry weight. DC later today.  Try to simplify medicines and reduce role of hydralazine/ nitrates (possibly a source of tachycardia) in favor of ARB and carvedilol. Life Vest with VT zone at 180. Had a very long discussion re: sodium restriction, daily weight monitoring, signs/symptoms of HF exacerbation, risk of VT/VF, role of external defib and possible future AICD. Reevaluate EF in 3 months. Early f/u: he is scheduled to return to HF clinic in 1 week.  Sanda Klein, MD, [place] [phone] 02/24/2012, 8:46 AM

## 2012-02-24 NOTE — Progress Notes (Signed)
Reviewed discharge instructions with patient. Spoke about diet and resources he could use if needed. Patient wore his life vest out of the hospital and understand how it works and how to use it. Transported via wheelchair to main entrance to be discharged.   Taraji Mungo, Mervin Kung RN

## 2012-02-24 NOTE — Discharge Summary (Signed)
Patient ID: Joshua Massey,  MRN: [id number], DOB/AGE: 1976-05-01 36 y.o.  Admit date: 02/15/2012 Discharge date: 02/24/2012  Primary Care Provider:  Primary Cardiologist: DrWeintrab  Discharge Diagnoses Principal Problem:  *Acute on chronic systolic heart failure Active Problems:  Non-ischemic cardiomyopathy, EF < 20%  DM2 (diabetes mellitus, type 2)  Hypertension  NSVT (nonsustained ventricular tachycardia)  Obesity  Coronary artery disease, 50% LAD at cath 02/16/12  Family history of coronary artery disease    Procedures: Cardiac cath 02/16/12   Hospital Course:  36 y.o. male with a history of morbid obesity, HTN, diabetes and prior bilateral hip surgery, as well as a known cardiomyopathy with EF 35% in 2010, who did not follow-up with our office. He presented 02/15/12 in decompensated heart failure and echo now shows LVEF of ~15% with global hypokinesis. He was admitted and diuresed with Milrinone and Lasix. R and Lt heart cath was done 02/16/12 and showed no significant obstructive disease and elevated Rt heart pressures. He was seen in consult by Dr Haroldine Laws. The pt was also diagnosed with poorly controlled diabetes. His wgt was 161kg on admission and 131kg at discharge. He has an appointment to see Dr Haroldine Laws in a week and he will see Dr Rollene Fare in 3 weeks. He was discharge on a Life Vest.   Discharge Vitals:  Blood pressure 112/78, pulse 109, temperature 98.6 F (37 C), temperature source Oral, resp. rate 18, height 5\' 11"  (1.803 m), weight 131.8 kg (290 lb 9.1 oz), SpO2 96.00%.    Labs: Results for orders placed during the hospital encounter of 02/15/12 (from the past 48 hour(s))  GLUCOSE, CAPILLARY     Status: Abnormal   Collection Time   02/22/12 11:33 AM      Component Value Range Comment   Glucose-Capillary 175 (*) 70 - 99 mg/dL   GLUCOSE, CAPILLARY     Status: Abnormal   Collection Time   02/22/12  4:43 PM      Component Value Range Comment   Glucose-Capillary 118  (*) 70 - 99 mg/dL   GLUCOSE, CAPILLARY     Status: Abnormal   Collection Time   02/22/12 10:49 PM      Component Value Range Comment   Glucose-Capillary 139 (*) 70 - 99 mg/dL   BASIC METABOLIC PANEL     Status: Abnormal   Collection Time   02/23/12  4:50 AM      Component Value Range Comment   Sodium 137  135 - 145 mEq/L    Potassium 3.9  3.5 - 5.1 mEq/L    Chloride 103  96 - 112 mEq/L    CO2 26  19 - 32 mEq/L    Glucose, Bld 112 (*) 70 - 99 mg/dL    BUN 10  6 - 23 mg/dL    Creatinine, Ser 0.77  0.50 - 1.35 mg/dL    Calcium 8.7  8.4 - 10.5 mg/dL    GFR calc non Af Amer >90  >90 mL/min    GFR calc Af Amer >90  >90 mL/min   CARBOXYHEMOGLOBIN     Status: Abnormal   Collection Time   02/23/12  5:00 AM      Component Value Range Comment   Total hemoglobin 13.4 (*) 13.5 - 18.0 g/dL    O2 Saturation 64.6      Carboxyhemoglobin 1.8 (*) 0.5 - 1.5 %    Methemoglobin 0.6  0.0 - 1.5 %   GLUCOSE, CAPILLARY     Status: Abnormal  Collection Time   02/23/12  7:41 AM      Component Value Range Comment   Glucose-Capillary 141 (*) 70 - 99 mg/dL   GLUCOSE, CAPILLARY     Status: Abnormal   Collection Time   02/23/12 11:45 AM      Component Value Range Comment   Glucose-Capillary 205 (*) 70 - 99 mg/dL   GLUCOSE, CAPILLARY     Status: Abnormal   Collection Time   02/23/12  4:37 PM      Component Value Range Comment   Glucose-Capillary 145 (*) 70 - 99 mg/dL   GLUCOSE, CAPILLARY     Status: Abnormal   Collection Time   02/23/12  9:23 PM      Component Value Range Comment   Glucose-Capillary 141 (*) 70 - 99 mg/dL    Comment 1 Notify RN     BASIC METABOLIC PANEL     Status: Abnormal   Collection Time   02/24/12  5:00 AM      Component Value Range Comment   Sodium 136  135 - 145 mEq/L    Potassium 4.8  3.5 - 5.1 mEq/L DELTA CHECK NOTED   Chloride 101  96 - 112 mEq/L    CO2 25  19 - 32 mEq/L    Glucose, Bld 101 (*) 70 - 99 mg/dL    BUN 9  6 - 23 mg/dL    Creatinine, Ser 0.73  0.50 - 1.35 mg/dL     Calcium 9.1  8.4 - 10.5 mg/dL    GFR calc non Af Amer >90  >90 mL/min    GFR calc Af Amer >90  >90 mL/min   PRO B NATRIURETIC PEPTIDE     Status: Abnormal   Collection Time   02/24/12  5:00 AM      Component Value Range Comment   Pro B Natriuretic peptide (BNP) 1531.0 (*) 0 - 125 pg/mL   CARBOXYHEMOGLOBIN     Status: Abnormal   Collection Time   02/24/12  6:26 AM      Component Value Range Comment   Total hemoglobin 13.9  13.5 - 18.0 g/dL    O2 Saturation 65.1      Carboxyhemoglobin 1.6 (*) 0.5 - 1.5 %    Methemoglobin 1.0  0.0 - 1.5 %   GLUCOSE, CAPILLARY     Status: Abnormal   Collection Time   02/24/12  7:26 AM      Component Value Range Comment   Glucose-Capillary 128 (*) 70 - 99 mg/dL     Disposition:  Follow-up Information    Follow up with Glori Bickers, MD on 02/28/2012. (at [facility]  [employer] Code [phone]))    Contact information:   [place] [phone]       Follow up with Rebecca Eaton, MD on 03/20/2012. (9;45am)    Contact information:   [address] [place] [address] [place] [phone]          Discharge Medications:  Medication List  As of 02/24/2012  9:29 AM   STOP taking these medications         valsartan 40 MG tablet         TAKE these medications         aspirin 81 MG EC tablet   Take 1 tablet (81 mg total) by mouth daily.      carvedilol 12.5 MG tablet   Commonly known as:  COREG   Take 0.5 tablets (6.25 mg total) by mouth 2 (two) times daily with a meal.      digoxin 0.25 MG tablet   Commonly known as: LANOXIN   Take 1 tablet (0.25 mg total) by mouth daily.      furosemide 40 MG tablet   Commonly known as: LASIX   Take 40 mg by mouth daily.      glipiZIDE 5 MG tablet   Commonly known as: GLUCOTROL   Take 1 tablet (5 mg total) by mouth 2 (two) times daily before a meal.      hydrALAZINE 50 MG tablet   Commonly known as: APRESOLINE   Take 1  tablet (50 mg total) by mouth 2 (two) times daily.      isosorbide mononitrate 30 MG 24 hr tablet   Commonly known as: IMDUR   Take 1 tablet (30 mg total) by mouth daily.      losartan 50 MG tablet   Commonly known as: COZAAR   Take 1 tablet (50 mg total) by mouth 2 (two) times daily.      spironolactone 25 MG tablet   Commonly known as: ALDACTONE   Take 1 tablet (25 mg total) by mouth daily.             Duration of Discharge Encounter: Greater than 30 minutes including physician time.  Angelena Form PA-C 02/24/2012 9:29 AM

## 2012-02-27 ENCOUNTER — Encounter (HOSPITAL_COMMUNITY): Payer: Self-pay

## 2012-02-27 ENCOUNTER — Ambulatory Visit (HOSPITAL_COMMUNITY)
Admit: 2012-02-27 | Discharge: 2012-02-27 | Disposition: A | Discharge status: Home / Self Care | Payer: BC Managed Care – PPO | Attending: Internal Medicine | Admitting: Internal Medicine

## 2012-02-27 VITALS — BP 120/86 | HR 116 | Ht 71.0 in | Wt 283.0 lb

## 2012-02-27 DIAGNOSIS — I10 Essential (primary) hypertension: Secondary | ICD-10-CM

## 2012-02-27 DIAGNOSIS — I5022 Chronic systolic (congestive) heart failure: Secondary | ICD-10-CM

## 2012-02-27 DIAGNOSIS — I2510 Atherosclerotic heart disease of native coronary artery without angina pectoris: Secondary | ICD-10-CM

## 2012-02-27 MED ORDER — HYDRALAZINE HCL 50 MG PO TABS: 75.0000 mg | ORAL_TABLET | Freq: Two times a day (BID) | ORAL | Status: DC

## 2012-02-27 NOTE — Assessment & Plan Note (Signed)
No ischemic symptoms, continue current therapy.

## 2012-02-27 NOTE — Progress Notes (Signed)
Cardiologist: Dr. Rollene Fare   Weight Range   283-285 pounds  Baseline proBNP   1531 on 02/24/12      HPI: Mr Crofts is a 36 year old AA with a history of DM, HTN, and chronic systolic heart failure (diagnosed in 2010 EF 30-35%). He has been followed by Dr Rollene Fare for HTN and heart failure but he has not been seen since 05/2010.   ECHO 02/15/12 EF <20%   02/16/12 RHC/LHC  RA - 28  RV - 61/29  PA - 63/18 (31)  FCO/CI - 4.28/1.59  TDCO/CI - 3.46/1.28  AO Sat - 94% (room air)  PA Sat - 49% (room air)  LVEDP 28  PVR = 0.8 Woods  Moderate tubular mid-LAD lesion, otherwise no significant obstructive CAD.   He was started on Milrinone post catheterization to aid with diuresis, down 66 pounds during admit.  Discharged 9/5.  Milrinone weaned and co-ox remained 65%.  Returns for post hospital follow up today.  He is doing very well.  He is playing in the park with his kids.  He denies dyspnea/orthopnea/PND.  He denies lower extremity edema.  He denies chest pain.  Is compliant with medications and daily weights.  Weight 284 pounds at home, down from 290 pounds at discharge.  Watching salt and fluid intake closely.   No shocks/alarms by LifeVest   ROS: All systems negative except as listed in HPI, PMH and Problem List.  Past Medical History  Diagnosis Date  . Hypertension   . CHF (congestive heart failure)   . Diabetes mellitus   . GERD (gastroesophageal reflux disease)     Current Outpatient Prescriptions  Medication Sig Dispense Refill  . aspirin EC 81 MG EC tablet Take 1 tablet (81 mg total) by mouth daily.      . carvedilol (COREG) 12.5 MG tablet Take 0.5 tablets (6.25 mg total) by mouth 2 (two) times daily with a meal.      . digoxin (LANOXIN) 0.25 MG tablet Take 1 tablet (0.25 mg total) by mouth daily.  30 tablet  11  . furosemide (LASIX) 40 MG tablet Take 40 mg by mouth daily.      Marland Kitchen glipiZIDE (GLUCOTROL) 5 MG tablet Take 1 tablet (5 mg total) by mouth 2 (two) times daily  before a meal.  60 tablet  11  . hydrALAZINE (APRESOLINE) 50 MG tablet Take 1 tablet (50 mg total) by mouth 2 (two) times daily.  60 tablet  11  . isosorbide mononitrate (IMDUR) 30 MG 24 hr tablet Take 1 tablet (30 mg total) by mouth daily.  30 tablet  11  . losartan (COZAAR) 50 MG tablet Take 1 tablet (50 mg total) by mouth 2 (two) times daily.  60 tablet  11  . spironolactone (ALDACTONE) 25 MG tablet Take 1 tablet (25 mg total) by mouth daily.  30 tablet  11     PHYSICAL EXAM: Filed Vitals:   02/27/12 1554  BP: 120/86  Pulse: 116  Height: 5\' 11"  (1.803 m)  Weight: 283 lb (128.368 kg)  SpO2: 98%    General: Well appearing. No resp difficulty  HEENT: normal  Neck: supple. JVP flat. Carotids 2+ bilat; no bruits. No lymphadenopathy or thryomegaly appreciated.  Cor: Tachy regular rate & rhythm. No rubs, gallops or murmurs. +s3.  LifeVest in place Lungs: clear  Abdomen:obese, nontender, nondistended. No hepatosplenomegaly. No bruits or masses. Good bowel sounds.  Extremities: no cyanosis, clubbing, edema  Neuro: alert & orientedx3, moves all 4  extremities w/o difficulty. Affect pleasant       ASSESSMENT & PLAN:

## 2012-02-27 NOTE — Patient Instructions (Addendum)
Increase hydralazine 1.5 tabs (75 mg) twice daily.  Follow up 2 weeks.  Do the following things EVERYDAY: 1) Weigh yourself in the morning before breakfast. Write it down and keep it in a log. 2) Take your medicines as prescribed 3) Eat low salt foods-Limit salt (sodium) to 2000 mg per day.  4) Stay as active as you can everyday 5) Limit all fluids for the day to less than 2 liters

## 2012-02-27 NOTE — Assessment & Plan Note (Signed)
Controlled, continue current therapy with titration of hydralazine.

## 2012-02-27 NOTE — Assessment & Plan Note (Signed)
Volume status looks great.  NYHA II-III.  Patient still remains guarded as he continues to have +S3 and sinus tach.  Will increase afterload reduction to help reduce tachycardia.  Continue lasix with sliding scale.  If he continues to lose weight he will hold his lasix.  Will hold of on titration of beta blocker at this time, possibly next visit.  Have spent 45 minutes with greater than 50% of the time discussing diet choices (low sodium), fluid restrictions and purpose of medications.  He seems to have more of an insight as well as interest in his diagnosis then when he was in the hospital.  Have also dicussed indications for LifeVest and plans for repeat echo in 3 months with max. Medical therapy, he voices understanding.  Will check BMET and digoxin level post hospital.

## 2012-02-28 ENCOUNTER — Encounter (HOSPITAL_COMMUNITY): Payer: BC Managed Care – PPO

## 2012-02-28 LAB — BASIC METABOLIC PANEL
CO2: 25 mEq/L (ref 19–32)
Calcium: 9.7 mg/dL (ref 8.4–10.5)
Creatinine, Ser: 0.9 mg/dL (ref 0.50–1.35)
Glucose, Bld: 84 mg/dL (ref 70–99)

## 2012-03-05 ENCOUNTER — Telehealth: Payer: Self-pay | Admitting: Endocrinology

## 2012-03-05 NOTE — Telephone Encounter (Signed)
i have not recently seen pt pcp is dr Joseph Art

## 2012-03-05 NOTE — Telephone Encounter (Signed)
Sharyn Lull from [facility] called to let Dr. Loanne Drilling know he has been enrolled in case management services due to his recent hospitalization for CHF and Cardiomyopathy.  This is a courtesy call.  No return call is needed unless further information is needed.

## 2012-03-05 NOTE — Telephone Encounter (Signed)
LMOM of Michelle at [facility] to let her know.

## 2012-03-16 ENCOUNTER — Ambulatory Visit (HOSPITAL_COMMUNITY)
Admission: RE | Admit: 2012-03-16 | Discharge: 2012-03-16 | Disposition: A | Discharge status: Home / Self Care | Payer: BC Managed Care – PPO | Source: Ambulatory Visit | Attending: Internal Medicine | Admitting: Internal Medicine

## 2012-03-16 ENCOUNTER — Encounter (HOSPITAL_COMMUNITY): Payer: Self-pay

## 2012-03-16 VITALS — BP 132/96 | HR 111 | Ht 69.0 in | Wt 284.0 lb

## 2012-03-16 DIAGNOSIS — E669 Obesity, unspecified: Secondary | ICD-10-CM | POA: Insufficient documentation

## 2012-03-16 DIAGNOSIS — I10 Essential (primary) hypertension: Secondary | ICD-10-CM | POA: Insufficient documentation

## 2012-03-16 DIAGNOSIS — I5022 Chronic systolic (congestive) heart failure: Secondary | ICD-10-CM | POA: Insufficient documentation

## 2012-03-16 MED ORDER — CARVEDILOL 12.5 MG PO TABS: 12.5000 mg | ORAL_TABLET | Freq: Two times a day (BID) | ORAL | Status: DC

## 2012-03-16 NOTE — Patient Instructions (Addendum)
Increase carvedilol 1 tab at night until Monday then start 1 tab twice daily.    Follow up 3 weeks.

## 2012-03-16 NOTE — Assessment & Plan Note (Signed)
Encouraged him to keep walking daily.

## 2012-03-16 NOTE — Progress Notes (Signed)
Cardiologist: Dr. Rollene Fare   Weight Range   283-285 pounds  Baseline proBNP   1531 on 02/24/12      HPI: Joshua Massey is a 36 year old AA with a history of DM, HTN, and chronic systolic heart failure (diagnosed in 2010 EF 30-35%). He has been followed by Dr Rollene Fare for HTN and heart failure but he has not been seen since 05/2010.   ECHO 02/15/12 EF <20%   02/16/12 RHC/LHC  RA - 28  RV - 61/29  PA - 63/18 (31)  FCO/CI - 4.28/1.59  TDCO/CI - 3.46/1.28  AO Sat - 94% (room air)  PA Sat - 49% (room air)  LVEDP 28  PVR = 0.8 Woods  Moderate tubular mid-LAD lesion, otherwise no significant obstructive CAD.   He was started on Milrinone post catheterization to aid with diuresis, down 66 pounds during admit.  Discharged 9/5.  Milrinone weaned and co-ox remained 65%.  Returns for follow up today.  He is doing well.  Says he feels much stronger.  He is walking ~0.5 miles to the park and playing with his kids without difficulty.    He denies  dyspnea/orthopnea/PND.  He denies shocks or alarms by LifeVest.  No edema.  Is watching his diet closely.  Weight at home is 276-278 pounds.  He has not had to take extra fluid pills.     ROS: All systems negative except as listed in HPI, PMH and Problem List.  Past Medical History  Diagnosis Date  . Hypertension   . CHF (congestive heart failure)   . Diabetes mellitus   . GERD (gastroesophageal reflux disease)     Current Outpatient Prescriptions  Medication Sig Dispense Refill  . aspirin EC 81 MG EC tablet Take 1 tablet (81 mg total) by mouth daily.      . carvedilol (COREG) 12.5 MG tablet Take 1 tablet (12.5 mg total) by mouth 2 (two) times daily with a meal.      . digoxin (LANOXIN) 0.25 MG tablet Take 1 tablet (0.25 mg total) by mouth daily.  30 tablet  11  . furosemide (LASIX) 40 MG tablet Take 40 mg by mouth daily.      Marland Kitchen glipiZIDE (GLUCOTROL) 5 MG tablet Take 1 tablet (5 mg total) by mouth 2 (two) times daily before a meal.  60 tablet  11    . hydrALAZINE (APRESOLINE) 50 MG tablet Take 1.5 tablets (75 mg total) by mouth 2 (two) times daily.  60 tablet  11  . isosorbide mononitrate (IMDUR) 30 MG 24 hr tablet Take 1 tablet (30 mg total) by mouth daily.  30 tablet  11  . losartan (COZAAR) 50 MG tablet Take 1 tablet (50 mg total) by mouth 2 (two) times daily.  60 tablet  11  . spironolactone (ALDACTONE) 25 MG tablet Take 1 tablet (25 mg total) by mouth daily.  30 tablet  11  . DISCONTD: carvedilol (COREG) 12.5 MG tablet Take 0.5 tablets (6.25 mg total) by mouth 2 (two) times daily with a meal.         PHYSICAL EXAM: Filed Vitals:   03/16/12 1001  BP: 132/96  Pulse: 111  Height: 5\' 9"  (1.753 m)  Weight: 284 lb (128.822 kg)  SpO2: 99%    General: Well appearing. No resp difficulty  HEENT: normal  Neck: supple. JVP flat. Carotids 2+ bilat; no bruits. No lymphadenopathy or thryomegaly appreciated.  Cor: Tachy regular rate & rhythm. No rubs, gallops or murmurs. LifeVest in  place Lungs: clear  Abdomen:obese, nontender, nondistended. No hepatosplenomegaly. No bruits or masses. Good bowel sounds.  Extremities: no cyanosis, clubbing, edema  Neuro: alert & orientedx3, moves all 4 extremities w/o difficulty. Affect pleasant       ASSESSMENT & PLAN:

## 2012-03-16 NOTE — Assessment & Plan Note (Signed)
As above, increase carvedilol.

## 2012-03-16 NOTE — Assessment & Plan Note (Signed)
NYHA II.  Volume status looks good.  Will increase carvedilol 12.5 mg BID.  Continue current diuretics, discussed need to hold diuretics if weight drops below 270 pounds or starts to get dizzy.  He will follow up with [facility] next week for further med titration and the HF clinic in 3 weeks.  Will repeat echo in 2 months for possible ICD if EF remains <35%.  Have encouraged him to keep up the good work.

## 2012-04-06 ENCOUNTER — Ambulatory Visit (HOSPITAL_COMMUNITY)
Admission: RE | Admit: 2012-04-06 | Discharge: 2012-04-06 | Disposition: A | Discharge status: Home / Self Care | Payer: BC Managed Care – PPO | Source: Ambulatory Visit | Attending: Internal Medicine | Admitting: Internal Medicine

## 2012-04-06 VITALS — BP 146/88 | HR 116 | Wt 282.5 lb

## 2012-04-06 DIAGNOSIS — I10 Essential (primary) hypertension: Secondary | ICD-10-CM

## 2012-04-06 DIAGNOSIS — I5022 Chronic systolic (congestive) heart failure: Secondary | ICD-10-CM

## 2012-04-06 DIAGNOSIS — K219 Gastro-esophageal reflux disease without esophagitis: Secondary | ICD-10-CM | POA: Insufficient documentation

## 2012-04-06 DIAGNOSIS — E119 Type 2 diabetes mellitus without complications: Secondary | ICD-10-CM | POA: Insufficient documentation

## 2012-04-06 DIAGNOSIS — I509 Heart failure, unspecified: Secondary | ICD-10-CM | POA: Insufficient documentation

## 2012-04-06 MED ORDER — HYDRALAZINE HCL 50 MG PO TABS: 75.0000 mg | ORAL_TABLET | Freq: Two times a day (BID) | ORAL | Status: DC

## 2012-04-06 MED ORDER — HYDRALAZINE HCL 50 MG PO TABS: 75.0000 mg | ORAL_TABLET | Freq: Three times a day (TID) | ORAL | Status: DC

## 2012-04-06 MED ORDER — CARVEDILOL 25 MG PO TABS: 25.0000 mg | ORAL_TABLET | Freq: Two times a day (BID) | ORAL | Status: DC

## 2012-04-06 NOTE — Progress Notes (Signed)
Cardiologist: Dr. Rollene Fare   Weight Range   283-285 pounds  Baseline proBNP   1531 on 02/24/12      HPI: Joshua Massey is a 36 year old AA with a history of DM, HTN, and chronic systolic heart failure (diagnosed in 2010 EF 30-35%). He has been followed by Dr Rollene Fare for HTN and heart failure but he has not been seen since 05/2010.   ECHO 02/15/12 EF <20%   02/16/12 RHC/LHC  RA - 28  RV - 61/29  PA - 63/18 (31)  FCO/CI - 4.28/1.59  TDCO/CI - 3.46/1.28  AO Sat - 94% (room air)  PA Sat - 49% (room air)  LVEDP 28  PVR = 0.8 Woods  Moderate tubular mid-LAD lesion, otherwise no significant obstructive CAD.   He was started on Milrinone post catheterization to aid with diuresis, down 66 pounds during admit.  Discharged 9/5.  Milrinone weaned and co-ox remained 65%.  Returns for follow up today.  He continues to do well.  He checks his blood pressure when he is at the store and last reading was 138/84.  He continue to play with his kids.  He had a scare with this LifeVest while he was playing on the ground with his kids.  He called the company and it was a false alarm, possibly friction from the carpet.  He denies  dyspnea/orthopnea/PND.  No edema.  He is watching his diet closely.  Weight at home is 280-283 pounds.    ROS: All systems negative except as listed in HPI, PMH and Problem List.  Past Medical History  Diagnosis Date  . Hypertension   . CHF (congestive heart failure)   . Diabetes mellitus   . GERD (gastroesophageal reflux disease)     Current Outpatient Prescriptions  Medication Sig Dispense Refill  . aspirin EC 81 MG EC tablet Take 1 tablet (81 mg total) by mouth daily.      . carvedilol (COREG) 12.5 MG tablet Take 1 tablet (12.5 mg total) by mouth 2 (two) times daily with a meal.      . digoxin (LANOXIN) 0.25 MG tablet Take 1 tablet (0.25 mg total) by mouth daily.  30 tablet  11  . furosemide (LASIX) 40 MG tablet Take 40 mg by mouth daily.      Marland Kitchen glipiZIDE (GLUCOTROL) 5  MG tablet Take 1 tablet (5 mg total) by mouth 2 (two) times daily before a meal.  60 tablet  11  . hydrALAZINE (APRESOLINE) 50 MG tablet Take 1.5 tablets (75 mg total) by mouth 2 (two) times daily.  90 tablet  3  . isosorbide mononitrate (IMDUR) 30 MG 24 hr tablet Take 1 tablet (30 mg total) by mouth daily.  30 tablet  11  . losartan (COZAAR) 50 MG tablet Take 1 tablet (50 mg total) by mouth 2 (two) times daily.  60 tablet  11  . spironolactone (ALDACTONE) 25 MG tablet Take 1 tablet (25 mg total) by mouth daily.  30 tablet  11  . DISCONTD: hydrALAZINE (APRESOLINE) 50 MG tablet Take 1.5 tablets (75 mg total) by mouth 2 (two) times daily.  60 tablet  11     PHYSICAL EXAM: Filed Vitals:   04/06/12 1027  BP: 146/88  Pulse: 116  Weight: 282 lb 8 oz (128.141 kg)  SpO2: 97%    General: Well appearing. No resp difficulty  HEENT: normal  Neck: supple. JVP flat. Carotids 2+ bilat; no bruits. No lymphadenopathy or thryomegaly appreciated.  Cor: Tachy  regular rate & rhythm. No rubs, gallops or murmurs. LifeVest in place Lungs: clear  Abdomen:obese, nontender, nondistended. No hepatosplenomegaly. No bruits or masses. Good bowel sounds.  Extremities: no cyanosis, clubbing, edema  Neuro: alert & orientedx3, moves all 4 extremities w/o difficulty. Affect pleasant       ASSESSMENT & PLAN:

## 2012-04-06 NOTE — Patient Instructions (Addendum)
Increase hydralazine 1.5 tabs three times a day.   Increase carvedilol 1.5 tabs twice daily for 1 week.  If feeling ok then increase to 2 tabs twice daily.  Follow up end of November with echo.

## 2012-04-09 NOTE — Assessment & Plan Note (Signed)
Uncontrolled, as above will titrate hydralazine 37.5 mg TID and carvedilol 25 mg BID.  Will continue to check BP when he is at the store.

## 2012-04-09 NOTE — Assessment & Plan Note (Addendum)
Volume status stable.  NYHA II.  Will titrate carvedilol 18.75 mg BID for the next week and if he tolerates this dose he will increase to 25 mg BID.  Will also titrate afterload reduction with increase in hydralazine to 37.5 mg TID as he was only taking this medication BID.  Have discussed low sodium diet and sliding scale lasix, he voices understanding.  Follow up 1 month with echo.  If EF remains <35% he will need ICD for primary prevention of SCD.

## 2012-05-16 ENCOUNTER — Ambulatory Visit (HOSPITAL_COMMUNITY): Payer: BC Managed Care – PPO

## 2012-05-16 ENCOUNTER — Ambulatory Visit (HOSPITAL_COMMUNITY): Payer: BC Managed Care – PPO | Attending: Family Medicine

## 2012-06-01 ENCOUNTER — Ambulatory Visit (HOSPITAL_COMMUNITY)
Admission: RE | Admit: 2012-06-01 | Discharge: 2012-06-01 | Disposition: A | Discharge status: Home / Self Care | Payer: BC Managed Care – PPO | Source: Ambulatory Visit | Attending: Internal Medicine | Admitting: Internal Medicine

## 2012-06-01 ENCOUNTER — Ambulatory Visit (HOSPITAL_BASED_OUTPATIENT_CLINIC_OR_DEPARTMENT_OTHER)
Admission: RE | Admit: 2012-06-01 | Discharge: 2012-06-01 | Disposition: A | Discharge status: Home / Self Care | Payer: BC Managed Care – PPO | Source: Ambulatory Visit | Attending: Internal Medicine | Admitting: Internal Medicine

## 2012-06-01 VITALS — BP 146/92 | HR 80 | Wt 277.5 lb

## 2012-06-01 DIAGNOSIS — I2510 Atherosclerotic heart disease of native coronary artery without angina pectoris: Secondary | ICD-10-CM | POA: Insufficient documentation

## 2012-06-01 DIAGNOSIS — I5022 Chronic systolic (congestive) heart failure: Secondary | ICD-10-CM | POA: Insufficient documentation

## 2012-06-01 DIAGNOSIS — I517 Cardiomegaly: Secondary | ICD-10-CM

## 2012-06-01 DIAGNOSIS — I509 Heart failure, unspecified: Secondary | ICD-10-CM | POA: Insufficient documentation

## 2012-06-01 DIAGNOSIS — I379 Nonrheumatic pulmonary valve disorder, unspecified: Secondary | ICD-10-CM | POA: Insufficient documentation

## 2012-06-01 DIAGNOSIS — I10 Essential (primary) hypertension: Secondary | ICD-10-CM | POA: Insufficient documentation

## 2012-06-01 DIAGNOSIS — I428 Other cardiomyopathies: Secondary | ICD-10-CM | POA: Insufficient documentation

## 2012-06-01 DIAGNOSIS — I059 Rheumatic mitral valve disease, unspecified: Secondary | ICD-10-CM | POA: Insufficient documentation

## 2012-06-01 DIAGNOSIS — I079 Rheumatic tricuspid valve disease, unspecified: Secondary | ICD-10-CM | POA: Insufficient documentation

## 2012-06-01 DIAGNOSIS — E119 Type 2 diabetes mellitus without complications: Secondary | ICD-10-CM | POA: Insufficient documentation

## 2012-06-01 NOTE — Progress Notes (Signed)
*  PRELIMINARY RESULTS* Echocardiogram 2D Echocardiogram has been performed.  Leavy Cella 06/01/2012, 9:36 AM

## 2012-06-01 NOTE — Progress Notes (Signed)
Patient ID: ZAHEER SZCZESNIAK, male   DOB: [birth year], 36 y.o.   MRN: [id number] Cardiologist: Dr. Rollene Fare   Weight Range   283-285 pounds  Baseline proBNP   1531 on 02/24/12      HPI: Mr Kosak is a 36 year old AA with a history of DM, HTN, and chronic systolic heart failure (diagnosed in 2010 EF 30-35%). He has been followed by Dr Rollene Fare for HTN and heart failure but he has not been seen since 05/2010.   ECHO 02/15/12 EF <20%   02/16/12 RHC/LHC  RA - 28  RV - 61/29  PA - 63/18 (31)  FCO/CI - 4.28/1.59  TDCO/CI - 3.46/1.28  AO Sat - 94% (room air)  PA Sat - 49% (room air)  LVEDP 28  PVR = 0.8 Woods  Moderate tubular mid-LAD lesion, otherwise no significant obstructive CAD.   He was started on Milrinone post catheterization to aid with diuresis, down 66 pounds during admit.  Discharged 9/5.  Milrinone weaned and co-ox remained 65%.  He returns for follow up. Last visit carvedilol increased to 25 mg bid and hydralazine increased to 75  mg tid. Denies SOB/PND/Orthopnea. Continues to wear lifevest. No alarms. Weight at home 278-280. Limiting fluid intake and following low salt diet. Works 2 full time jobs. Compliant with medications. He misses night time dose of  Hydralazine at least 2  times per week.     ROS: All systems negative except as listed in HPI, PMH and Problem List.  Past Medical History  Diagnosis Date  . Hypertension   . CHF (congestive heart failure)   . Diabetes mellitus   . GERD (gastroesophageal reflux disease)     Current Outpatient Prescriptions  Medication Sig Dispense Refill  . aspirin EC 81 MG EC tablet Take 1 tablet (81 mg total) by mouth daily.      . carvedilol (COREG) 25 MG tablet Take 1 tablet (25 mg total) by mouth 2 (two) times daily with a meal.  30 tablet  6  . digoxin (LANOXIN) 0.25 MG tablet Take 1 tablet (0.25 mg total) by mouth daily.  30 tablet  11  . furosemide (LASIX) 40 MG tablet Take 40 mg by mouth daily.      Marland Kitchen glipiZIDE (GLUCOTROL)  5 MG tablet Take 1 tablet (5 mg total) by mouth 2 (two) times daily before a meal.  60 tablet  11  . hydrALAZINE (APRESOLINE) 50 MG tablet Take 1.5 tablets (75 mg total) by mouth 3 (three) times daily.  135 tablet  1  . isosorbide mononitrate (IMDUR) 30 MG 24 hr tablet Take 1 tablet (30 mg total) by mouth daily.  30 tablet  11  . losartan (COZAAR) 50 MG tablet Take 1 tablet (50 mg total) by mouth 2 (two) times daily.  60 tablet  11  . spironolactone (ALDACTONE) 25 MG tablet Take 1 tablet (25 mg total) by mouth daily.  30 tablet  11     PHYSICAL EXAM: Filed Vitals:   06/01/12 1004  BP: 146/92  Pulse: 80  Weight: 277 lb 8 oz (125.873 kg)  SpO2: 99%    General: Well appearing. No resp difficulty  HEENT: normal  Neck: supple. JVP flat. Carotids 2+ bilat; no bruits. No lymphadenopathy or thryomegaly appreciated.  Cor: Regular regular rate & rhythm. No rubs, gallops or murmurs. LifeVest in place Lungs: clear  Abdomen:obese, nontender, nondistended. No hepatosplenomegaly. No bruits or masses. Good bowel sounds.  Extremities: no cyanosis, clubbing, edema  Neuro:  alert & orientedx3, moves all 4 extremities w/o difficulty. Affect pleasant       ASSESSMENT & PLAN:

## 2012-06-01 NOTE — Assessment & Plan Note (Addendum)
NYHA I. Volume status stable. ECHO results pending. Continue lifevest until ECHO result reviewed. Continue current medications. Reinforced daily weights, limiting fluid intake, and low salt food choices. Follow up in 1 month.

## 2012-06-01 NOTE — Patient Instructions (Addendum)
Follow up in 1 month  Call us back with your hydralazine dose  Do the following things EVERYDAY: 1) Weigh yourself in the morning before breakfast. Write it down and keep it in a log. 2) Take your medicines as prescribed 3) Eat low salt foods-Limit salt (sodium) to 2000 mg per day.  4) Stay as active as you can everyday 5) Limit all fluids for the day to less than 2 liters

## 2012-06-01 NOTE — Assessment & Plan Note (Signed)
Continue current regimen. Reinforced medication compliance with regards to hydralazine three times a day.

## 2012-07-05 ENCOUNTER — Ambulatory Visit (HOSPITAL_COMMUNITY)
Admission: RE | Admit: 2012-07-05 | Discharge: 2012-07-05 | Disposition: A | Discharge status: Home / Self Care | Payer: BC Managed Care – PPO | Source: Ambulatory Visit | Attending: Internal Medicine | Admitting: Internal Medicine

## 2012-07-05 ENCOUNTER — Encounter (HOSPITAL_COMMUNITY): Payer: Self-pay

## 2012-07-05 VITALS — BP 126/94 | HR 88 | Wt 278.4 lb

## 2012-07-05 DIAGNOSIS — I472 Ventricular tachycardia, unspecified: Secondary | ICD-10-CM | POA: Insufficient documentation

## 2012-07-05 DIAGNOSIS — I10 Essential (primary) hypertension: Secondary | ICD-10-CM | POA: Insufficient documentation

## 2012-07-05 DIAGNOSIS — I4729 Other ventricular tachycardia: Secondary | ICD-10-CM | POA: Insufficient documentation

## 2012-07-05 DIAGNOSIS — I5022 Chronic systolic (congestive) heart failure: Secondary | ICD-10-CM | POA: Insufficient documentation

## 2012-07-05 NOTE — Progress Notes (Addendum)
Cardiologist: Dr. Rollene Fare   Weight Range   283-285 pounds  Baseline proBNP   1531 on 02/24/12      HPI: Mr Sportsman is a 37 year old AA with a history of DM, HTN, and chronic systolic heart failure (diagnosed in 2010 EF 30-35%). He has been followed by Dr Rollene Fare for HTN and heart failure but he has not been seen since 05/2010.   ECHO 02/15/12 EF <20%   02/16/12 RHC/LHC  RA - 28  RV - 61/29  PA - 63/18 (31)  FCO/CI - 4.28/1.59  TDCO/CI - 3.46/1.28  AO Sat - 94% (room air)  PA Sat - 49% (room air)  LVEDP 28  PVR = 0.8 Woods  Moderate tubular mid-LAD lesion, otherwise no significant obstructive CAD.   He was started on Milrinone post catheterization to aid with diuresis, down 66 pounds during admit.  Discharged 9/5.  Milrinone weaned and co-ox remained 65%.  Echo 06/01/12: LVEF 25%, diffuse hypokinesis.  LA mod dilated  He returns for follow up today.  He feels well.  He denies SOB/orthopnea or PND. Continues to work 2 full time jobs, no lifting but does walk around checking off supplies. Compliant with medications.  Lifevest in place, no alarms.    ROS: All systems negative except as listed in HPI, PMH and Problem List.  Past Medical History  Diagnosis Date  . Hypertension   . CHF (congestive heart failure)   . Diabetes mellitus   . GERD (gastroesophageal reflux disease)     Current Outpatient Prescriptions  Medication Sig Dispense Refill  . aspirin EC 81 MG EC tablet Take 1 tablet (81 mg total) by mouth daily.      . carvedilol (COREG) 25 MG tablet Take 1 tablet (25 mg total) by mouth 2 (two) times daily with a meal.  30 tablet  6  . digoxin (LANOXIN) 0.25 MG tablet Take 1 tablet (0.25 mg total) by mouth daily.  30 tablet  11  . furosemide (LASIX) 40 MG tablet Take 40 mg by mouth daily.      Marland Kitchen glipiZIDE (GLUCOTROL) 5 MG tablet Take 1 tablet (5 mg total) by mouth 2 (two) times daily before a meal.  60 tablet  11  . hydrALAZINE (APRESOLINE) 50 MG tablet Take 1.5 tablets  (75 mg total) by mouth 3 (three) times daily.  135 tablet  1  . isosorbide mononitrate (IMDUR) 30 MG 24 hr tablet Take 1 tablet (30 mg total) by mouth daily.  30 tablet  11  . losartan (COZAAR) 50 MG tablet Take 1 tablet (50 mg total) by mouth 2 (two) times daily.  60 tablet  11  . spironolactone (ALDACTONE) 25 MG tablet Take 1 tablet (25 mg total) by mouth daily.  30 tablet  11     PHYSICAL EXAM: Filed Vitals:   07/05/12 0929  BP: 126/94  Pulse: 88  Weight: 278 lb 6.4 oz (126.281 kg)  SpO2: 99%    General: Well appearing. No resp difficulty  HEENT: normal  Neck: supple. JVP flat. Carotids 2+ bilat; no bruits. No lymphadenopathy or thryomegaly appreciated.  Cor: Regular regular rate & rhythm. No rubs, gallops or murmurs. LifeVest in place Lungs: clear  Abdomen:obese, nontender, nondistended. No hepatosplenomegaly. No bruits or masses. Good bowel sounds.  Extremities: no cyanosis, clubbing, edema  Neuro: alert & orientedx3, moves all 4 extremities w/o difficulty. Affect pleasant       ASSESSMENT & PLAN:

## 2012-07-06 NOTE — Assessment & Plan Note (Addendum)
NYHA II.  Volume status stable.  EF remains <35%.  Have discussed risk/benefits of ICD implantation for primary prevention of SCD.  Will refer for possible ICD.  He is going to call [facility] and check on payment methods prior to scheduling appointment.  Continue LifeVest.  Encouraged him to keep up the good work.

## 2012-07-06 NOTE — Assessment & Plan Note (Signed)
Lifevest in place, no shocks at this time.

## 2012-07-06 NOTE — Assessment & Plan Note (Signed)
Well controlled, continue current therapy. 

## 2012-07-24 ENCOUNTER — Other Ambulatory Visit (HOSPITAL_COMMUNITY): Payer: Self-pay | Admitting: Physician Assistant

## 2012-08-15 ENCOUNTER — Other Ambulatory Visit (HOSPITAL_COMMUNITY): Payer: Self-pay | Admitting: Cardiovascular Disease

## 2012-08-15 DIAGNOSIS — I10 Essential (primary) hypertension: Secondary | ICD-10-CM

## 2012-08-15 DIAGNOSIS — E1110 Type 2 diabetes mellitus with ketoacidosis without coma: Secondary | ICD-10-CM

## 2012-08-15 DIAGNOSIS — I428 Other cardiomyopathies: Secondary | ICD-10-CM

## 2012-08-30 ENCOUNTER — Ambulatory Visit (HOSPITAL_COMMUNITY): Payer: BC Managed Care – PPO

## 2012-08-31 ENCOUNTER — Ambulatory Visit (HOSPITAL_COMMUNITY)
Admission: RE | Admit: 2012-08-31 | Discharge: 2012-08-31 | Disposition: A | Discharge status: Home / Self Care | Payer: BC Managed Care – PPO | Source: Ambulatory Visit | Attending: Cardiovascular Disease | Admitting: Cardiovascular Disease

## 2012-08-31 DIAGNOSIS — I428 Other cardiomyopathies: Secondary | ICD-10-CM | POA: Insufficient documentation

## 2012-08-31 DIAGNOSIS — E1110 Type 2 diabetes mellitus with ketoacidosis without coma: Secondary | ICD-10-CM

## 2012-08-31 DIAGNOSIS — E1310 Other specified diabetes mellitus with ketoacidosis without coma: Secondary | ICD-10-CM | POA: Insufficient documentation

## 2012-08-31 DIAGNOSIS — I10 Essential (primary) hypertension: Secondary | ICD-10-CM | POA: Insufficient documentation

## 2012-08-31 DIAGNOSIS — I5023 Acute on chronic systolic (congestive) heart failure: Secondary | ICD-10-CM

## 2012-08-31 HISTORY — PX: TRANSTHORACIC ECHOCARDIOGRAM: SHX275

## 2012-08-31 NOTE — Progress Notes (Signed)
[facility]   2D echo completed 08/31/2012.   Jamison Neighbor, RDCS

## 2012-10-01 ENCOUNTER — Telehealth (HOSPITAL_COMMUNITY): Payer: Self-pay | Admitting: Cardiology

## 2012-10-01 ENCOUNTER — Encounter (HOSPITAL_COMMUNITY): Payer: Self-pay | Admitting: Cardiology

## 2012-10-01 NOTE — Telephone Encounter (Signed)
I have been unable to reach this patient by phone.  A letter is being sent to the last known home address.

## 2012-11-01 ENCOUNTER — Ambulatory Visit: Payer: BC Managed Care – PPO | Admitting: Endocrinology

## 2012-11-08 ENCOUNTER — Encounter (HOSPITAL_COMMUNITY): Payer: BC Managed Care – PPO

## 2012-11-08 ENCOUNTER — Encounter (HOSPITAL_COMMUNITY): Payer: Self-pay | Admitting: *Deleted

## 2012-11-09 ENCOUNTER — Ambulatory Visit: Payer: BC Managed Care – PPO | Admitting: Endocrinology

## 2012-11-09 DIAGNOSIS — Z0289 Encounter for other administrative examinations: Secondary | ICD-10-CM

## 2013-01-01 ENCOUNTER — Other Ambulatory Visit: Payer: Self-pay | Admitting: *Deleted

## 2013-01-01 ENCOUNTER — Other Ambulatory Visit: Payer: Self-pay | Admitting: Cardiovascular Disease

## 2013-01-01 DIAGNOSIS — R0989 Other specified symptoms and signs involving the circulatory and respiratory systems: Secondary | ICD-10-CM

## 2013-01-01 LAB — CBC WITH DIFFERENTIAL/PLATELET
Basophils Absolute: 0 10*3/uL (ref 0.0–0.1)
Basophils Relative: 0 % (ref 0–1)
Eosinophils Relative: 1 % (ref 0–5)
HCT: 40.7 % (ref 39.0–52.0)
MCH: 29.2 pg (ref 26.0–34.0)
MCHC: 33.4 g/dL (ref 30.0–36.0)
MCV: 87.5 fL (ref 78.0–100.0)
Monocytes Absolute: 0.8 10*3/uL (ref 0.1–1.0)
RDW: 14.4 % (ref 11.5–15.5)

## 2013-01-01 LAB — COMPREHENSIVE METABOLIC PANEL
AST: 14 U/L (ref 0–37)
Alkaline Phosphatase: 83 U/L (ref 39–117)
BUN: 12 mg/dL (ref 6–23)
Calcium: 9.3 mg/dL (ref 8.4–10.5)
Chloride: 99 mEq/L (ref 96–112)
Creat: 1.04 mg/dL (ref 0.50–1.35)

## 2013-01-01 LAB — LIPID PANEL
HDL: 28 mg/dL — ABNORMAL LOW (ref >39)
Total CHOL/HDL Ratio: 4.6 Ratio

## 2013-01-01 LAB — MAGNESIUM: Magnesium: 1.6 mg/dL (ref 1.5–2.5)

## 2013-01-03 ENCOUNTER — Telehealth: Payer: Self-pay | Admitting: Cardiovascular Disease

## 2013-01-03 MED ORDER — PANTOPRAZOLE SODIUM 40 MG PO TBEC: 40.0000 mg | DELAYED_RELEASE_TABLET | Freq: Every day | ORAL | Status: DC

## 2013-01-03 NOTE — Telephone Encounter (Signed)
Returned call.  Left message to call back before 4pm and leave message with name of pharmacy.

## 2013-01-03 NOTE — Telephone Encounter (Signed)
Joshua Massey is returning a call to tell which pharmacy he use.Marland Kitchen

## 2013-01-03 NOTE — Telephone Encounter (Deleted)
Message forwarded to J.C. Wildman, LPN.  

## 2013-01-03 NOTE — Telephone Encounter (Signed)
Saw Dr Gilda Crease on Tuesday-thought he was going to give him a prescription for indigestion and acid reflux-when he got home he did not have one!

## 2013-01-03 NOTE — Telephone Encounter (Signed)
Pt called back and left message (see other note).  Rx for Protonix 40mg  daily #30 w/ 11 refills sent to pharmacy.  Order documented in dictation and in paper chart by Dr. Rollene Fare.

## 2013-01-09 ENCOUNTER — Telehealth: Payer: Self-pay | Admitting: Cardiovascular Disease

## 2013-01-09 NOTE — Telephone Encounter (Signed)
Returning a call about his blood work .Marland Kitchen Please call    Thanks

## 2013-01-09 NOTE — Telephone Encounter (Signed)
Pt. Informed about his lab work and labs were faxed over to dr. Loanne Drilling, pt. Stated he has already made an appt with dr. Loanne Drilling for next week

## 2013-01-10 ENCOUNTER — Encounter: Payer: Self-pay | Admitting: Cardiovascular Disease

## 2013-01-16 ENCOUNTER — Inpatient Hospital Stay (HOSPITAL_COMMUNITY): Admission: RE | Admit: 2013-01-16 | Payer: BC Managed Care – PPO | Source: Ambulatory Visit

## 2013-01-24 ENCOUNTER — Telehealth (HOSPITAL_COMMUNITY): Payer: Self-pay | Admitting: Cardiovascular Disease

## 2013-01-24 ENCOUNTER — Other Ambulatory Visit: Payer: Self-pay

## 2013-01-24 MED ORDER — DIGOXIN 250 MCG PO TABS: 0.2500 mg | ORAL_TABLET | Freq: Every day | ORAL | Status: DC

## 2013-01-24 MED ORDER — SPIRONOLACTONE 25 MG PO TABS: 25.0000 mg | ORAL_TABLET | Freq: Every day | ORAL | Status: DC

## 2013-01-24 MED ORDER — ISOSORBIDE MONONITRATE ER 30 MG PO TB24: 30.0000 mg | ORAL_TABLET | Freq: Every day | ORAL | Status: DC

## 2013-01-24 NOTE — Addendum Note (Signed)
Addended by: Diana Eves on: 01/24/2013 06:28 PM   Modules accepted: Orders

## 2013-01-24 NOTE — Telephone Encounter (Signed)
Rx was sent to pharmacy electronically via [employer].

## 2013-03-21 ENCOUNTER — Other Ambulatory Visit: Payer: Self-pay | Admitting: *Deleted

## 2013-03-21 MED ORDER — HYDRALAZINE HCL 50 MG PO TABS: 50.0000 mg | ORAL_TABLET | Freq: Two times a day (BID) | ORAL | Status: DC

## 2013-03-25 ENCOUNTER — Other Ambulatory Visit: Payer: Self-pay | Admitting: *Deleted

## 2013-04-02 ENCOUNTER — Other Ambulatory Visit: Payer: Self-pay | Admitting: *Deleted

## 2013-04-02 MED ORDER — LOSARTAN POTASSIUM 50 MG PO TABS: 50.0000 mg | ORAL_TABLET | Freq: Two times a day (BID) | ORAL | Status: DC

## 2013-04-02 MED ORDER — HYDRALAZINE HCL 50 MG PO TABS: 50.0000 mg | ORAL_TABLET | Freq: Two times a day (BID) | ORAL | Status: DC

## 2013-04-02 MED ORDER — GLIPIZIDE 5 MG PO TABS: 5.0000 mg | ORAL_TABLET | Freq: Two times a day (BID) | ORAL | Status: DC

## 2013-04-03 ENCOUNTER — Other Ambulatory Visit: Payer: Self-pay | Admitting: *Deleted

## 2013-04-03 MED ORDER — LOSARTAN POTASSIUM 50 MG PO TABS: 50.0000 mg | ORAL_TABLET | Freq: Two times a day (BID) | ORAL | Status: DC

## 2013-04-10 ENCOUNTER — Encounter: Payer: Self-pay | Admitting: *Deleted

## 2013-04-15 ENCOUNTER — Ambulatory Visit: Payer: BC Managed Care – PPO | Admitting: Internal Medicine

## 2013-06-26 ENCOUNTER — Telehealth: Payer: Self-pay | Admitting: *Deleted

## 2013-08-09 ENCOUNTER — Telehealth: Payer: Self-pay | Admitting: *Deleted

## 2013-08-09 MED ORDER — CARVEDILOL 25 MG PO TABS: 37.5000 mg | ORAL_TABLET | Freq: Two times a day (BID) | ORAL | Status: DC

## 2013-08-09 NOTE — Telephone Encounter (Signed)
Pt was calling in regards to Carvedilol 25mg  not being sent to his pharmacy yet. He stated that he called yesterday to have this filled.  [place]

## 2013-08-09 NOTE — Telephone Encounter (Signed)
Returned call.  Left message to call back before 4pm.  Refill(s) sent to pharmacy #[id number] w/o refills.

## 2013-08-09 NOTE — Telephone Encounter (Signed)
Returned call.  Left message w/ male answering for pt to call back before 4pm.

## 2013-08-22 ENCOUNTER — Ambulatory Visit: Payer: Self-pay

## 2013-08-22 ENCOUNTER — Other Ambulatory Visit: Payer: Self-pay | Admitting: Occupational Medicine

## 2013-08-22 DIAGNOSIS — R52 Pain, unspecified: Secondary | ICD-10-CM

## 2013-08-27 ENCOUNTER — Other Ambulatory Visit: Payer: Self-pay | Admitting: Internal Medicine

## 2013-08-27 NOTE — Telephone Encounter (Signed)
Rx was sent to pharmacy electronically. 

## 2013-09-10 ENCOUNTER — Ambulatory Visit: Payer: BC Managed Care – PPO | Admitting: Internal Medicine

## 2013-09-16 ENCOUNTER — Other Ambulatory Visit: Payer: Self-pay | Admitting: Internal Medicine

## 2013-09-16 NOTE — Telephone Encounter (Signed)
Rx refill sent to pharmacy with instructions to keep appt. On 10/11/13

## 2013-10-03 ENCOUNTER — Ambulatory Visit: Payer: Self-pay | Admitting: Endocrinology

## 2013-10-08 ENCOUNTER — Telehealth: Payer: Self-pay | Admitting: Cardiovascular Disease

## 2013-10-09 NOTE — Telephone Encounter (Signed)
Closed encounter °

## 2013-10-10 ENCOUNTER — Ambulatory Visit: Payer: BC Managed Care – PPO | Admitting: Cardiovascular Disease

## 2013-10-10 ENCOUNTER — Ambulatory Visit: Payer: Self-pay | Admitting: Endocrinology

## 2013-10-11 NOTE — Telephone Encounter (Signed)
Encounter closed--10/11/12 tp 

## 2013-10-14 ENCOUNTER — Ambulatory Visit: Payer: Self-pay | Admitting: Endocrinology

## 2013-10-14 ENCOUNTER — Other Ambulatory Visit: Payer: Self-pay

## 2013-10-14 MED ORDER — CARVEDILOL 25 MG PO TABS: 37.5000 mg | ORAL_TABLET | Freq: Two times a day (BID) | ORAL | Status: DC

## 2013-10-14 NOTE — Telephone Encounter (Signed)
Rx was sent to pharmacy electronically. 

## 2013-10-17 ENCOUNTER — Ambulatory Visit: Payer: Self-pay | Admitting: Endocrinology

## 2013-10-21 ENCOUNTER — Encounter: Payer: Self-pay | Admitting: Endocrinology

## 2013-10-21 ENCOUNTER — Ambulatory Visit (INDEPENDENT_AMBULATORY_CARE_PROVIDER_SITE_OTHER): Payer: BC Managed Care – PPO | Admitting: Endocrinology

## 2013-10-21 VITALS — BP 126/84 | HR 100 | Temp 99.1°F | Ht 71.0 in | Wt 300.0 lb

## 2013-10-21 DIAGNOSIS — E118 Type 2 diabetes mellitus with unspecified complications: Secondary | ICD-10-CM

## 2013-10-21 DIAGNOSIS — E1159 Type 2 diabetes mellitus with other circulatory complications: Secondary | ICD-10-CM | POA: Insufficient documentation

## 2013-10-21 DIAGNOSIS — E119 Type 2 diabetes mellitus without complications: Secondary | ICD-10-CM

## 2013-10-21 LAB — HEMOGLOBIN A1C: Hgb A1c MFr Bld: 14.7 % — ABNORMAL HIGH (ref 4.6–6.5)

## 2013-10-21 LAB — MICROALBUMIN / CREATININE URINE RATIO
Creatinine,U: 86 mg/dL
Microalb Creat Ratio: 120.3 mg/g — ABNORMAL HIGH (ref 0.0–30.0)

## 2013-10-21 MED ORDER — GLUCOSE BLOOD VI STRP: 1.0000 | ORAL_STRIP | Freq: Every day | Status: DC

## 2013-10-21 NOTE — Patient Instructions (Addendum)
good diet and exercise habits significanly improve the control of your diabetes.  please let me know if you wish to be referred to a dietician.  high blood sugar is very risky to your health.  you should see an eye doctor every year.  You are at higher than average risk for pneumonia and hepatitis-B.  You should be vaccinated against both.   controlling your blood pressure and cholesterol drastically reduces the damage diabetes does to your body.  this also applies to quitting smoking.  please discuss these with your doctor.  check your blood sugar once a day.  vary the time of day when you check, between before the 3 meals, and at bedtime.  also check if you have symptoms of your blood sugar being too high or too low.  please keep a record of the readings and bring it to your next appointment here.  You can write it on any piece of paper.  please call us sooner if your blood sugar goes below 70, or if you have a lot of readings over 200.  blood and urine tests are being requested for you today.  We'll contact you with results.   If the blood sugar is high, we'll add "bromocriptine."  Please come back for a follow-up appointment in 2 weeks.

## 2013-10-21 NOTE — Progress Notes (Signed)
Subjective:    Patient ID: Joshua Massey, male    DOB: March 05, 1976, 38 y.o.   MRN: [id number]  HPI I last saw this pt in 2011; DM was dx'ed in 2010, when he was found to have glucose of 300; he has mild if any neuropathy of the lower extremities, but he has associated CAD.  he has never been on insulin.  pt says his diet and exercise are "fair."  He has never had severe hypoglycemia or DKA.  Past Medical History  Diagnosis Date  . Hypertension   . CHF (congestive heart failure)   . Diabetes mellitus     adult-onset, type 2  . GERD (gastroesophageal reflux disease)   . Exogenous obesity     Past Surgical History  Procedure Laterality Date  . Ankle surgery  2003    plate and screws  . Hip pinning  1990    bilateral  . Cardiac catheterization  02/16/2012    50% LAD lesion in mid vessel, otherwise no significant obstructive CAD  . Transthoracic echocardiogram  08/31/2012    EF 35-40%, no significant wall motion abnormalities, diastolic relaxation abnormality - no longer needed LifeVest; LV cavity size mod dilated with mod conc hypertrophy, systolic function mod reduced; mild MR, LA mod dilated    History   Social History  . Marital Status: Single    Spouse Name: N/A    Number of Children: 6  . Years of Education: 12   Occupational History  . 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 Smoking status: Never Smoker   . Smokeless tobacco: Never Used  . Alcohol Use: No  . Drug Use: No  . Sexual Activity: Yes    Birth Control/ Protection: Condom   Other Topics Concern  . Not on file   Social History Narrative  . No narrative on file    Current Outpatient Prescriptions on File Prior to Visit  Medication Sig Dispense Refill  . Acetaminophen (TYLENOL PO) Take by mouth as needed.      Marland Kitchen aspirin 81 MG tablet Take 81 mg by mouth daily.      . carvedilol (COREG) 25 MG tablet Take 1.5 tablets (37.5 mg  total) by mouth 2 (two) times daily with a meal.  45 tablet  0  . digoxin (LANOXIN) 0.25 MG tablet Take 1 tablet (0.25 mg total) by mouth daily.  180 tablet  1  . furosemide (LASIX) 40 MG tablet Take 40 mg by mouth daily.      Marland Kitchen glipiZIDE (GLUCOTROL) 5 MG tablet Take 1 tablet (5 mg total) by mouth 2 (two) times daily before a meal.  60 tablet  6  . hydrALAZINE (APRESOLINE) 50 MG tablet Take 1 tablet (50 mg total) by mouth 2 (two) times daily.  60 tablet  6  . isosorbide mononitrate (IMDUR) 30 MG 24 hr tablet Take 1 tablet (30 mg total) by mouth daily.  180 tablet  1  . losartan (COZAAR) 50 MG tablet Take 50-100 mg by mouth 2 (two) times daily. 100mg  QAM 50mg  QHS      . pantoprazole (PROTONIX) 40 MG tablet Take 1 tablet (40 mg total) by mouth daily.  30 tablet  11  . spironolactone (ALDACTONE) 25 MG tablet Take 1 tablet (25 mg total) by mouth daily.  180 tablet  1   No current facility-administered medications on file prior  to visit.    No Known Allergies  Family History  Problem Relation Age of Onset  . Coronary artery disease Brother     CABG at 2, DM  . Diabetes Mother   . Hypertension Mother   . Diabetes Father     BP 126/84  Pulse 100  Temp(Src) 99.1 F (37.3 C) (Oral)  Ht 5\' 11"  (1.803 m)  Wt 300 lb (136.079 kg)  BMI 41.86 kg/m2  SpO2 98%    Review of Systems denies blurry vision, headache, chest pain, sob, n/v, urinary frequency, cramps, excessive diaphoresis, memory loss, depression, cold intolerance, rhinorrhea, and easy bruising.  He has gained a few lbs.  He has ED sxs.    Objective:   Physical Exam VS: see vs page GEN: no distress HEAD: head: no deformity eyes: no periorbital swelling, no proptosis external nose and ears are normal mouth: no lesion seen NECK: supple, thyroid is not enlarged CHEST WALL: no deformity LUNGS: clear to auscultation BREASTS:  No gynecomastia CV: reg rate and rhythm, no murmur ABD: abdomen is soft, nontender.  no  hepatosplenomegaly.  not distended.  no hernia MUSCULOSKELETAL: muscle bulk and strength are grossly normal.  no obvious joint swelling.  gait is normal and steady PULSES: no carotid bruit NEURO:  cn 2-12 grossly intact.   readily moves all 4's.  SKIN:  Normal texture and temperature.  No rash or suspicious lesion is visible.   NODES:  None palpable at the neck PSYCH: alert, well-oriented.  Does not appear anxious nor depressed.  Lab Results  Component Value Date   HGBA1C 10.8* 01/01/2013      Assessment & Plan:  DM: probably not well-controlled Noncompliance with f/u appts and cbg monitoring: this causes high risk to his health:i gave pt a meter CAD: in this context, he should avoid hypoglycemia ED: prob due to DM and/or b-blocker, but it is not safe to d/c b-blocker.

## 2013-10-22 ENCOUNTER — Other Ambulatory Visit: Payer: Self-pay | Admitting: Endocrinology

## 2013-10-22 DIAGNOSIS — E118 Type 2 diabetes mellitus with unspecified complications: Secondary | ICD-10-CM

## 2013-10-23 ENCOUNTER — Ambulatory Visit: Payer: Self-pay | Admitting: Endocrinology

## 2013-10-30 ENCOUNTER — Encounter: Payer: Self-pay | Attending: Endocrinology | Admitting: Nutrition

## 2013-10-30 ENCOUNTER — Telehealth: Payer: Self-pay | Admitting: Endocrinology

## 2013-10-30 ENCOUNTER — Ambulatory Visit (INDEPENDENT_AMBULATORY_CARE_PROVIDER_SITE_OTHER): Payer: BC Managed Care – PPO | Admitting: Cardiovascular Disease

## 2013-10-30 ENCOUNTER — Encounter: Payer: Self-pay | Admitting: Cardiovascular Disease

## 2013-10-30 VITALS — BP 160/96 | HR 87 | Ht 71.0 in | Wt 294.0 lb

## 2013-10-30 DIAGNOSIS — E119 Type 2 diabetes mellitus without complications: Secondary | ICD-10-CM | POA: Insufficient documentation

## 2013-10-30 DIAGNOSIS — Z713 Dietary counseling and surveillance: Secondary | ICD-10-CM | POA: Insufficient documentation

## 2013-10-30 DIAGNOSIS — I428 Other cardiomyopathies: Secondary | ICD-10-CM

## 2013-10-30 DIAGNOSIS — E118 Type 2 diabetes mellitus with unspecified complications: Secondary | ICD-10-CM

## 2013-10-30 DIAGNOSIS — I509 Heart failure, unspecified: Secondary | ICD-10-CM

## 2013-10-30 DIAGNOSIS — I10 Essential (primary) hypertension: Secondary | ICD-10-CM

## 2013-10-30 MED ORDER — CARVEDILOL 25 MG PO TABS: 37.5000 mg | ORAL_TABLET | Freq: Two times a day (BID) | ORAL | Status: DC

## 2013-10-30 NOTE — Progress Notes (Signed)
10/30/2013 Joshua Massey   Nov 16, 1975  [id number]  Primary Physician Robyn Haber, MD Primary Cardiologist: Lorretta Harp MD [name]   HPI:  Joshua Massey is a 38 year old severely overweight single Caucasian male mother of 6 children who works at [employer]. He was previously a patient of Dr. Lowella Fairy. He has seen Dr. Haroldine Laws  in the heart failure clinic in the past as well. He has what is presumed to be hypertensive heart disease. He underwent right and left heart cath by Dr. Debara Pickett 03/19/12 revealing normal coronary arteries and elevated filling pressures. Has not seen him back for a year and he has not had any recurrent CHF admissions. He aerosol restriction.   Current Outpatient Prescriptions  Medication Sig Dispense Refill  . Acetaminophen (TYLENOL PO) Take by mouth as needed.      Marland Kitchen aspirin 81 MG tablet Take 81 mg by mouth daily.      . carvedilol (COREG) 25 MG tablet Take 1.5 tablets (37.5 mg total) by mouth 2 (two) times daily with a meal.  45 tablet  0  . digoxin (LANOXIN) 0.25 MG tablet Take 1 tablet (0.25 mg total) by mouth daily.  180 tablet  1  . furosemide (LASIX) 40 MG tablet Take 40 mg by mouth daily.      Marland Kitchen glucose blood (ONETOUCH VERIO) test strip 1 each by Other route daily. And lancets 1/day 250.90  100 each  12  . hydrALAZINE (APRESOLINE) 50 MG tablet Take 1 tablet (50 mg total) by mouth 2 (two) times daily.  60 tablet  6  . isosorbide mononitrate (IMDUR) 30 MG 24 hr tablet Take 1 tablet (30 mg total) by mouth daily.  180 tablet  1  . losartan (COZAAR) 50 MG tablet Take 50-100 mg by mouth 2 (two) times daily. 100mg  QAM 50mg  QHS      . pantoprazole (PROTONIX) 40 MG tablet Take 1 tablet (40 mg total) by mouth daily.  30 tablet  11  . spironolactone (ALDACTONE) 25 MG tablet Take 1 tablet (25 mg total) by mouth daily.  180 tablet  1   No current facility-administered medications for this visit.    No Known Allergies  History   Social  History  . Marital Status: Single    Spouse Name: N/A    Number of Children: 6  . Years of Education: 12   Occupational History  . 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 Smoking status: Never Smoker   . Smokeless tobacco: Never Used  . Alcohol Use: No  . Drug Use: No  . Sexual Activity: Yes    Birth Control/ Protection: Condom   Other Topics Concern  . Not on file   Social History Narrative  . No narrative on file     Review of Systems: General: negative for chills, fever, night sweats or weight changes.  Cardiovascular: negative for chest pain, dyspnea on exertion, edema, orthopnea, palpitations, paroxysmal nocturnal dyspnea or shortness of breath Dermatological: negative for rash Respiratory: negative for cough or wheezing Urologic: negative for hematuria Abdominal: negative for nausea, vomiting, diarrhea, bright red blood per rectum, melena, or hematemesis Neurologic: negative for visual changes, syncope, or dizziness All other systems reviewed and are otherwise negative except as noted above.    Blood pressure 160/96, pulse 87, height 5\' 11"  (1.803 m), weight 294 lb (133.358 kg).  General appearance:  alert and no distress Neck: no adenopathy, no carotid bruit, no JVD, supple, symmetrical, trachea midline and thyroid not enlarged, symmetric, no tenderness/mass/nodules Lungs: clear to auscultation bilaterally Heart: soft S3 gallop Extremities: extremities normal, atraumatic, no cyanosis or edema  EKG normal sinus rhythm at 87 with a nonspecific IVCD  ASSESSMENT AND PLAN:   HYPERTENSION The patient has severe hypertension on multiple antihypertensive medications with hypertensive heart disease. He is running out of several of his medications. He is aware of some restriction. His blood pressure today is 160/96.  Non-ischemic cardiomyopathy, EF < 20% The patient underwent right and left heart  cath by Dr. Berdine Addison T9/30/13 revealing normal coronary arteries with elevated filling pressures. His EF the past has been as low as 20%. He did relate this to 1 point. His most recent ejection fraction after medical therapy was 35-40%. He ultimately did not require an ICD. He is aware saw ejection. Unfortunately, he knows medications recently and made a followup appointment in order to refill his meds.      Lorretta Harp MD FACP,FACC,FAHA, [facility] 10/30/2013 3:58 PM

## 2013-10-30 NOTE — Assessment & Plan Note (Signed)
The patient underwent right and left heart cath by Dr. Berdine Addison T9/30/13 revealing normal coronary arteries with elevated filling pressures. His EF the past has been as low as 20%. He did relate this to 1 point. His most recent ejection fraction after medical therapy was 35-40%. He ultimately did not require an ICD. He is aware saw ejection. Unfortunately, he knows medications recently and made a followup appointment in order to refill his meds.

## 2013-10-30 NOTE — Patient Instructions (Addendum)
Limit fried foods to 2X/wk,   Limit salad dressings to 1 package per time.  Walk for 30-40 min. 5 times/wk.  Pick up pace a work and walk q 1 hour for 10 min.   Limit meat portions to no more than 5 ounces at supper meal.  Test blood sugars twice a day for one week.  Before meals and/or bedtime  Call blood sugars to me in one week.

## 2013-10-30 NOTE — Telephone Encounter (Signed)
I saw this patient today for diet/SBGM.  He did not bring his machine, and has only tested blood twice since his visit with you: 190, and 240 both before meals.  He has started reducing his meal portion sizes and has stopped all sweet drinks. He has lost 4.2 pounds   Diet is still high in fat, but he is eager to limit fat, to get more weight off.  He will call his blood sugars to me in one week.  Do you want to start him on oral meds, as your note said, or wait one more week?  I will forward you the readings in one week if you want.

## 2013-10-30 NOTE — Telephone Encounter (Signed)
Yes, ok until he starts insulin

## 2013-10-30 NOTE — Assessment & Plan Note (Signed)
The patient has severe hypertension on multiple antihypertensive medications with hypertensive heart disease. He is running out of several of his medications. He is aware of some restriction. His blood pressure today is 160/96.

## 2013-10-30 NOTE — Telephone Encounter (Signed)
Pt scheduled for 11/06/2013 at 9:45 am.

## 2013-10-30 NOTE — Telephone Encounter (Signed)
His a1c is much higher than reported cbg's.  If he refuses insulin, then orals would be the next best option.  Please let me know.

## 2013-10-30 NOTE — Telephone Encounter (Signed)
Pt has no more refills on the glipiZide

## 2013-10-30 NOTE — Progress Notes (Signed)
Patient is here today to review diet and blood sugar readings. He did not bring his meter here today.  He says he has tested his blood sugars only twice.  It was 190 in the morning, and 240, 5 hours after lunch.   He works from 12PM to 12 AM and does "rounds once every 3 hours", otherwise sits at a desk.  He also works at [employer] from [place] to 4 AM.   Benedict Needy at [employer] eats nothing until 12 Noon.  First meal is now:  1 Carton Dannon yogurt, and 1 fruit cup, diet drink                                                         4-5 PM:  Takes food from home.  Baked chicken wings,  And diet drink                                                                12AM:  Meal eaten out--[employer] (6 inch) diet drinks, Zaxby salad with chicken and 2 pkgs dressings.                                                            3-4AM: "small snack" of handful of chips, or 1 few crackers   Plan:  Limit fried foods to 2X/wk,            Limit salad dressings to 1 package per time.          Walk for 30-40 min. 5 times/wk.  Pick up pace a work and walk q 1 hour for 10 min.            Limit meat portions to no more than 5 ounces at supper meal.

## 2013-10-30 NOTE — Patient Instructions (Signed)
  We will see you back in follow up in 1 year with Dr Gwenlyn Found  Dr Gwenlyn Found has ordered a echocardiogram. Echocardiography is a painless test that uses sound waves to create images of your heart. It provides your doctor with information about the size and shape of your heart and how well your heart's chambers and valves are working. This procedure takes approximately one hour. There are no restrictions for this procedure.

## 2013-10-30 NOTE — Telephone Encounter (Signed)
Spoke with pt and he states that he would be ok with starting the insulin. Pt states that oral medication was sent to his pharmacy does he need to take this until he is seen to discuss starting insulin? Please advise, Thanks!

## 2013-11-01 MED ORDER — GLIPIZIDE 5 MG PO TABS: 5.0000 mg | ORAL_TABLET | Freq: Two times a day (BID) | ORAL | Status: AC

## 2013-11-01 NOTE — Telephone Encounter (Signed)
Refill sent.

## 2013-11-04 ENCOUNTER — Ambulatory Visit: Payer: Self-pay | Admitting: Endocrinology

## 2013-11-04 DIAGNOSIS — Z0289 Encounter for other administrative examinations: Secondary | ICD-10-CM

## 2013-11-06 ENCOUNTER — Ambulatory Visit: Payer: Self-pay | Admitting: Endocrinology

## 2013-11-06 DIAGNOSIS — Z0289 Encounter for other administrative examinations: Secondary | ICD-10-CM

## 2013-11-12 ENCOUNTER — Ambulatory Visit (HOSPITAL_COMMUNITY): Payer: Self-pay

## 2013-11-26 ENCOUNTER — Telehealth (HOSPITAL_COMMUNITY): Payer: Self-pay | Admitting: *Deleted

## 2013-12-02 ENCOUNTER — Other Ambulatory Visit: Payer: Self-pay

## 2013-12-02 MED ORDER — HYDRALAZINE HCL 50 MG PO TABS: 50.0000 mg | ORAL_TABLET | Freq: Two times a day (BID) | ORAL | Status: DC

## 2013-12-02 NOTE — Telephone Encounter (Signed)
Rx was sent to pharmacy electronically. 

## 2013-12-06 ENCOUNTER — Other Ambulatory Visit: Payer: Self-pay | Admitting: *Deleted

## 2013-12-06 MED ORDER — FUROSEMIDE 40 MG PO TABS: 40.0000 mg | ORAL_TABLET | Freq: Every day | ORAL | Status: DC

## 2013-12-06 NOTE — Telephone Encounter (Signed)
Rx was sent to pharmacy electronically. 

## 2013-12-23 NOTE — Telephone Encounter (Signed)
Message left on his machine again,  to call me with blood sugar readings

## 2014-01-28 ENCOUNTER — Other Ambulatory Visit: Payer: Self-pay

## 2014-01-28 MED ORDER — ISOSORBIDE MONONITRATE ER 30 MG PO TB24: 30.0000 mg | ORAL_TABLET | Freq: Every day | ORAL | Status: DC

## 2014-01-28 NOTE — Telephone Encounter (Signed)
Rx was sent to pharmacy electronically. 

## 2014-02-03 ENCOUNTER — Other Ambulatory Visit: Payer: Self-pay | Admitting: *Deleted

## 2014-02-03 ENCOUNTER — Telehealth: Payer: Self-pay | Admitting: Cardiovascular Disease

## 2014-02-03 MED ORDER — SPIRONOLACTONE 25 MG PO TABS: 25.0000 mg | ORAL_TABLET | Freq: Every day | ORAL | Status: DC

## 2014-02-03 MED ORDER — ISOSORBIDE MONONITRATE ER 30 MG PO TB24: 30.0000 mg | ORAL_TABLET | Freq: Every day | ORAL | Status: DC

## 2014-02-03 MED ORDER — DIGOXIN 250 MCG PO TABS: 0.2500 mg | ORAL_TABLET | Freq: Every day | ORAL | Status: DC

## 2014-02-03 NOTE — Telephone Encounter (Signed)
Have we received the refill requests for his Imdur and Digoxin from [place] at [facility]?

## 2014-02-03 NOTE — Telephone Encounter (Signed)
Rx refills sent to patient pharmacy

## 2014-02-04 ENCOUNTER — Other Ambulatory Visit: Payer: Self-pay | Admitting: *Deleted

## 2014-02-04 MED ORDER — SPIRONOLACTONE 25 MG PO TABS: 25.0000 mg | ORAL_TABLET | Freq: Every day | ORAL | Status: DC

## 2014-02-06 ENCOUNTER — Ambulatory Visit: Payer: Self-pay | Admitting: Endocrinology

## 2014-02-06 DIAGNOSIS — Z0289 Encounter for other administrative examinations: Secondary | ICD-10-CM

## 2014-04-10 ENCOUNTER — Other Ambulatory Visit: Payer: Self-pay | Admitting: Internal Medicine

## 2014-04-11 NOTE — Telephone Encounter (Signed)
Rx was sent to pharmacy electronically. 

## 2014-04-14 ENCOUNTER — Other Ambulatory Visit: Payer: Self-pay | Admitting: *Deleted

## 2014-04-14 MED ORDER — PANTOPRAZOLE SODIUM 40 MG PO TBEC: 40.0000 mg | DELAYED_RELEASE_TABLET | Freq: Every day | ORAL | Status: DC

## 2014-05-29 ENCOUNTER — Encounter (HOSPITAL_COMMUNITY): Payer: Self-pay | Admitting: Internal Medicine

## 2014-09-02 ENCOUNTER — Other Ambulatory Visit: Payer: Self-pay | Admitting: Cardiovascular Disease

## 2014-09-03 NOTE — Telephone Encounter (Signed)
Rx(s) sent to pharmacy electronically.  

## 2014-11-22 ENCOUNTER — Other Ambulatory Visit: Payer: Self-pay | Admitting: Cardiovascular Disease

## 2014-11-24 NOTE — Telephone Encounter (Signed)
Rx has been sent to the pharmacy electronically. ° °

## 2014-12-19 ENCOUNTER — Other Ambulatory Visit: Payer: Self-pay | Admitting: Cardiovascular Disease

## 2014-12-19 NOTE — Telephone Encounter (Signed)
Rx(s) sent to pharmacy electronically.  

## 2015-02-12 ENCOUNTER — Other Ambulatory Visit: Payer: Self-pay | Admitting: Endocrinology

## 2015-02-12 ENCOUNTER — Other Ambulatory Visit: Payer: Self-pay | Admitting: Cardiovascular Disease

## 2015-02-12 NOTE — Telephone Encounter (Signed)
Rx sent and appointment letter mailed.

## 2015-02-12 NOTE — Telephone Encounter (Signed)
Please refill x 1 Ov is due  

## 2015-02-12 NOTE — Telephone Encounter (Signed)
Please advise if ok to refill. Last OV was 10/21/2013.

## 2015-02-12 NOTE — Telephone Encounter (Signed)
REFILL 

## 2015-02-23 ENCOUNTER — Other Ambulatory Visit: Payer: Self-pay | Admitting: Cardiovascular Disease

## 2015-04-09 ENCOUNTER — Other Ambulatory Visit: Payer: Self-pay | Admitting: Cardiology

## 2015-05-05 ENCOUNTER — Other Ambulatory Visit: Payer: Self-pay | Admitting: Cardiovascular Disease

## 2015-05-05 ENCOUNTER — Other Ambulatory Visit: Payer: Self-pay | Admitting: Cardiology

## 2015-05-07 NOTE — Telephone Encounter (Signed)
REFILL 

## 2015-05-08 ENCOUNTER — Other Ambulatory Visit: Payer: Self-pay | Admitting: Cardiovascular Disease

## 2015-05-08 NOTE — Telephone Encounter (Signed)
Rx(s) sent to pharmacy electronically.  

## 2015-06-18 ENCOUNTER — Other Ambulatory Visit: Payer: Self-pay | Admitting: Cardiovascular Disease

## 2015-06-18 ENCOUNTER — Other Ambulatory Visit: Payer: Self-pay | Admitting: Cardiology

## 2015-06-19 ENCOUNTER — Other Ambulatory Visit: Payer: Self-pay | Admitting: Cardiovascular Disease

## 2015-06-19 ENCOUNTER — Other Ambulatory Visit: Payer: Self-pay | Admitting: Cardiology

## 2015-06-19 NOTE — Telephone Encounter (Signed)
REFILL 

## 2015-06-23 ENCOUNTER — Other Ambulatory Visit: Payer: Self-pay | Admitting: Cardiovascular Disease

## 2015-06-24 ENCOUNTER — Other Ambulatory Visit: Payer: Self-pay | Admitting: Cardiology

## 2015-06-24 ENCOUNTER — Other Ambulatory Visit: Payer: Self-pay | Admitting: Cardiovascular Disease

## 2015-07-09 ENCOUNTER — Other Ambulatory Visit: Payer: Self-pay | Admitting: Cardiovascular Disease

## 2015-07-09 ENCOUNTER — Other Ambulatory Visit: Payer: Self-pay | Admitting: Cardiology

## 2015-08-06 ENCOUNTER — Other Ambulatory Visit: Payer: Self-pay | Admitting: Cardiology

## 2015-08-06 ENCOUNTER — Other Ambulatory Visit: Payer: Self-pay | Admitting: Internal Medicine

## 2015-08-06 ENCOUNTER — Other Ambulatory Visit: Payer: Self-pay | Admitting: Cardiovascular Disease

## 2015-08-06 NOTE — Telephone Encounter (Signed)
Rx request sent to pharmacy.  

## 2015-08-25 ENCOUNTER — Ambulatory Visit: Payer: Self-pay | Admitting: Cardiovascular Disease

## 2015-09-10 ENCOUNTER — Other Ambulatory Visit: Payer: Self-pay | Admitting: Cardiovascular Disease

## 2015-09-11 NOTE — Telephone Encounter (Signed)
Rx request sent to pharmacy.  

## 2015-10-09 ENCOUNTER — Ambulatory Visit: Payer: Self-pay | Admitting: Cardiovascular Disease

## 2015-10-20 ENCOUNTER — Other Ambulatory Visit: Payer: Self-pay | Admitting: Internal Medicine

## 2015-10-20 ENCOUNTER — Other Ambulatory Visit: Payer: Self-pay | Admitting: Cardiovascular Disease

## 2015-10-20 NOTE — Telephone Encounter (Signed)
REFILL 

## 2015-10-30 ENCOUNTER — Ambulatory Visit (INDEPENDENT_AMBULATORY_CARE_PROVIDER_SITE_OTHER): Payer: BLUE CROSS/BLUE SHIELD | Admitting: Cardiovascular Disease

## 2015-10-30 ENCOUNTER — Encounter: Payer: Self-pay | Admitting: Cardiovascular Disease

## 2015-10-30 VITALS — BP 172/112 | HR 100 | Ht 71.0 in | Wt 300.2 lb

## 2015-10-30 DIAGNOSIS — Z79899 Other long term (current) drug therapy: Secondary | ICD-10-CM

## 2015-10-30 DIAGNOSIS — I10 Essential (primary) hypertension: Secondary | ICD-10-CM | POA: Diagnosis not present

## 2015-10-30 MED ORDER — LOSARTAN POTASSIUM 100 MG PO TABS: 100.0000 mg | ORAL_TABLET | Freq: Every day | ORAL | Status: DC

## 2015-10-30 MED ORDER — ISOSORBIDE MONONITRATE ER 30 MG PO TB24: 30.0000 mg | ORAL_TABLET | Freq: Every day | ORAL | Status: DC

## 2015-10-30 MED ORDER — DIGOXIN 250 MCG PO TABS: 250.0000 ug | ORAL_TABLET | Freq: Every day | ORAL | Status: DC

## 2015-10-30 MED ORDER — SPIRONOLACTONE 25 MG PO TABS: 25.0000 mg | ORAL_TABLET | Freq: Every day | ORAL | Status: DC

## 2015-10-30 MED ORDER — HYDRALAZINE HCL 50 MG PO TABS: 50.0000 mg | ORAL_TABLET | Freq: Two times a day (BID) | ORAL | Status: DC

## 2015-10-30 MED ORDER — CARVEDILOL 25 MG PO TABS: ORAL_TABLET | ORAL | Status: DC

## 2015-10-30 MED ORDER — FUROSEMIDE 40 MG PO TABS: 40.0000 mg | ORAL_TABLET | Freq: Every day | ORAL | Status: DC

## 2015-10-30 NOTE — Assessment & Plan Note (Signed)
history of nonischemic cardiomyopathy and assured by clean coronary arteries are right and left heart cath performed by Dr. Debara Pickett 03/19/12. His last 2-D echo performed 08/31/12 revealed an EF of 35-40% with mild to moderate LV dilatation and mild concentric LV hypertrophy. E barely ran out of all his medicines yesterday. He is aware salt restriction. He denies chest pain or shortness of breath.

## 2015-10-30 NOTE — Progress Notes (Signed)
10/30/2015 Joshua Massey   07/20/1975  [id number]  Primary Physician Renato Shin, MD Primary Cardiologist: Lorretta Harp MD [name]   HPI:  Mr. [name] is a 40 year old severely overweight single Caucasian male father of 6 children who works at [employer]. Unfortunately he was recently let go from his job 2 weeks ago. He was previously a patient of Dr. Lowella Fairy and has seen  Dr. Haroldine Laws in the heart failure clinic in the past as well. He has what is presumed to be hypertensive heart disease. He underwent right and left heart cath by Dr. Debara Pickett 03/19/12 revealing normal coronary arteries and elevated filling pressures. i last saw him in the office 2 years ago. He currently ran out of all his medicines yesterday. He denies chest pain or shortness of breath.   Current Outpatient Prescriptions  Medication Sig Dispense Refill  . Acetaminophen (TYLENOL PO) Take by mouth as needed.    Marland Kitchen aspirin 81 MG tablet Take 81 mg by mouth daily.    . carvedilol (COREG) 25 MG tablet TAKE ONE TABLET BY MOUTH TWICE DAILY WITH  A  MEAL 30 tablet 0  . digoxin (LANOXIN) 0.25 MG tablet TAKE ONE TABLET BY MOUTH ONCE DAILY 30 tablet 0  . furosemide (LASIX) 40 MG tablet TAKE ONE TABLET BY MOUTH DAILY 30 tablet 0  . hydrALAZINE (APRESOLINE) 50 MG tablet TAKE ONE TABLET BY MOUTH TWICE DAILY 60 tablet 0  . isosorbide mononitrate (IMDUR) 30 MG 24 hr tablet Take 1 tablet (30 mg total) by mouth daily. 180 tablet 0  . ONETOUCH VERIO test strip USE DAILY 100 each 0  . pantoprazole (PROTONIX) 40 MG tablet Take 1 tablet (40 mg total) by mouth daily. 30 tablet 0  . spironolactone (ALDACTONE) 25 MG tablet TAKE ONE TABLET BY MOUTH ONCE DAILY 30 tablet 0  . [DISCONTINUED] losartan (COZAAR) 50 MG tablet TAKE TWO TABLETS BY MOUTH EVERY MORNING AND TAKE ONE TABLET BY MOUTH AT BEDTIME. 90 tablet 0   No current facility-administered medications for this visit.    No Known Allergies  Social History    Social History  . Marital Status: Single    Spouse Name: N/A  . Number of Children: 6  . Years of Education: 12   Occupational History  . meter checker     Joshua Massey  . 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 Smoking status: Never Smoker   . Smokeless tobacco: Never Used  . Alcohol Use: No  . Drug Use: No  . Sexual Activity: Yes    Birth Control/ Protection: Condom   Other Topics Concern  . Not on file   Social History Narrative     Review of Systems: General: negative for chills, fever, night sweats or weight changes.  Cardiovascular: negative for chest pain, dyspnea on exertion, edema, orthopnea, palpitations, paroxysmal nocturnal dyspnea or shortness of breath Dermatological: negative for rash Respiratory: negative for cough or wheezing Urologic: negative for hematuria Abdominal: negative for nausea, vomiting, diarrhea, bright red blood per rectum, melena, or hematemesis Neurologic: negative for visual changes, syncope, or dizziness All other systems reviewed and are otherwise negative except as noted above.    Blood pressure 172/112, pulse 100, height 5\' 11"  (1.803 m), weight 300 lb 4 oz (136.193 kg).  General appearance: alert and no distress Neck: no adenopathy, no carotid bruit, no JVD, supple, symmetrical, trachea midline and thyroid not enlarged, symmetric, no tenderness/mass/nodules  Lungs: clear to auscultation bilaterally Heart: regular rate and rhythm, S1, S2 normal, no murmur, click, rub or gallop Extremities: extremities normal, atraumatic, no cyanosis or edema  EKG normal sinus rhythm at 94 with evidence of left hypertrophy with repolarization changes nd right axis deviation. I personally reviewed this EKG  ASSESSMENT AND PLAN:   Non-ischemic cardiomyopathy, EF < 20% history of nonischemic cardiomyopathy and assured by clean coronary arteries are right and left heart cath performed by Dr. Debara Pickett 03/19/12. His  last 2-D echo performed 08/31/12 revealed an EF of 35-40% with mild to moderate LV dilatation and mild concentric LV hypertrophy. E barely ran out of all his medicines yesterday. He is aware salt restriction. He denies chest pain or shortness of breath.  Essential hypertension history of hypertension blood pressure measurements at 172/112. He apparently ran out of all of his medicines yesterday. Blood pressure at home yesterday was better than this. We will write him scripts for all his medications. I am having him check his blood pressure on daily basis and keep a log over the next 30 days. He'll see Erasmo Downer back for evaluation and some titration.      Lorretta Harp MD FACP,FACC,FAHA, [facility] 10/30/2015 4:06 PM

## 2015-10-30 NOTE — Patient Instructions (Addendum)
Medication Instructions:  Your physician has recommended you make the following change in your medication:  1- INCREASE your losartan to 100 mg (1 tablet) by mouth once a day.    Labwork: Your physician recommends that you return for lab work in [place]. The lab can be found on the FIRST FLOOR of out building in [address]   Testing/Procedures: none  Follow-Up: Your physician wants you to follow-up in: [place]. You will receive a reminder letter in the mail two months in advance. If you don't receive a letter, please call our office to schedule the follow-up appointment.  DR BERRY WANTS YOU TO SEE PHARMACIST FOR BP CHECKS AND BP MANAGEMENT IN 4 WEEKS.   Any Other Special Instructions Will Be Listed Below (If Applicable).     If you need a refill on your cardiac medications before your next appointment, please call your pharmacy.     Marland Kitchen

## 2015-10-30 NOTE — Assessment & Plan Note (Signed)
history of hypertension blood pressure measurements at 172/112. He apparently ran out of all of his medicines yesterday. Blood pressure at home yesterday was better than this. We will write him scripts for all his medications. I am having him check his blood pressure on daily basis and keep a log over the next 30 days. He'll see Erasmo Downer back for evaluation and some titration.

## 2015-11-23 ENCOUNTER — Ambulatory Visit: Payer: Self-pay | Admitting: Endocrinology

## 2015-11-27 ENCOUNTER — Ambulatory Visit: Payer: Self-pay

## 2015-11-30 ENCOUNTER — Ambulatory Visit: Payer: Self-pay | Admitting: Endocrinology

## 2015-12-01 ENCOUNTER — Telehealth: Payer: Self-pay | Admitting: Endocrinology

## 2015-12-01 NOTE — Telephone Encounter (Signed)
Please come back for a follow-up appointment in 3 months 

## 2015-12-01 NOTE — Telephone Encounter (Signed)
Could you please call and reschedule? Thanks!

## 2015-12-01 NOTE — Telephone Encounter (Signed)
Patient no showed today's appt. Please advise on how to follow up. °A. No follow up necessary. °B. Follow up urgent. Contact patient immediately. °C. Follow up necessary. Contact patient and schedule visit in ___ days. °D. Follow up advised. Contact patient and schedule visit in ____weeks. ° °

## 2015-12-02 NOTE — Telephone Encounter (Signed)
LVM for PT to reschedule

## 2015-12-21 ENCOUNTER — Other Ambulatory Visit: Payer: Self-pay | Admitting: Cardiovascular Disease

## 2015-12-30 ENCOUNTER — Encounter: Payer: Self-pay | Admitting: Endocrinology

## 2015-12-30 ENCOUNTER — Ambulatory Visit (INDEPENDENT_AMBULATORY_CARE_PROVIDER_SITE_OTHER): Payer: BLUE CROSS/BLUE SHIELD | Admitting: Endocrinology

## 2015-12-30 ENCOUNTER — Encounter: Payer: BLUE CROSS/BLUE SHIELD | Attending: Endocrinology | Admitting: Nutrition

## 2015-12-30 VITALS — BP 160/110 | HR 94 | Wt 300.0 lb

## 2015-12-30 DIAGNOSIS — Z713 Dietary counseling and surveillance: Secondary | ICD-10-CM | POA: Diagnosis not present

## 2015-12-30 DIAGNOSIS — E1159 Type 2 diabetes mellitus with other circulatory complications: Secondary | ICD-10-CM | POA: Diagnosis present

## 2015-12-30 LAB — POCT GLYCOSYLATED HEMOGLOBIN (HGB A1C): HEMOGLOBIN A1C: 15

## 2015-12-30 MED ORDER — INSULIN GLARGINE 100 UNIT/ML SOLOSTAR PEN: 30.0000 [IU] | PEN_INJECTOR | SUBCUTANEOUS | Status: DC

## 2015-12-30 NOTE — Progress Notes (Signed)
We discussed how the insulin works, and why he needs to take this now.  He reported good understanding of this. We also discussed how to attach a needle, where to inject, the need to rotate sites, how to remove the needle, and where to store the insulin.  He had no final questions. He was given another One Touch Verio meter with test strips and suggested he test his blood sugars once a day, varying the time, before meals or at bedtime.  He agreed to do this.  He is giving his brother insulin using a pen, so he know how to do this. We reviewed low blood sugars--symptoms and treatment.  He reported good understandingf of this, and had no final questions.  He reports drinking sweet drinks, and I stressed the need to stop this, and switch to diet drinks, or non calorie drinks.  He was given Splenda samples for ice tea to use in place of sugar. He was also told to stop drinking juices and eating cold cereal and milk.  He had no  Final questions.

## 2015-12-30 NOTE — Progress Notes (Signed)
Subjective:    Patient ID: Joshua Massey, male    DOB: 1975/12/13, 40 y.o.   MRN: [id number]  HPI Pt returns for f/u of diabetes mellitus: DM type: 2 Dx'ed: [id number] Complications: CAD Therapy: mewtformin DKA: never Severe hypoglycemia: never Pancreatitis: never Other: he has never been on insulin Interval history: he has not recently taken the metformin.  pt states he feels well in general.  Specifically, he denies polyuria Past Medical History  Diagnosis Date  . Hypertension   . CHF (congestive heart failure) (HCC)     nonischemic cardiopathy  . Diabetes mellitus     adult-onset, type 2  . GERD (gastroesophageal reflux disease)   . Exogenous obesity     Past Surgical History  Procedure Laterality Date  . Ankle surgery  2003    plate and screws  . Hip pinning  1990    bilateral  . Cardiac catheterization  02/16/2012    50% LAD lesion in mid vessel, otherwise no significant obstructive CAD  . Transthoracic echocardiogram  08/31/2012    EF 35-40%, no significant wall motion abnormalities, diastolic relaxation abnormality - no longer needed LifeVest; LV cavity size mod dilated with mod conc hypertrophy, systolic function mod reduced; mild MR, LA mod dilated  . Left and right heart catheterization with coronary angiogram N/A 02/16/2012    Procedure: LEFT AND RIGHT HEART CATHETERIZATION WITH CORONARY ANGIOGRAM;  Surgeon: Pixie Casino, MD;  Location: [facility] CATH LAB;  Service: Cardiovascular;  Laterality: N/A;    Social History   Social History  . Marital Status: Single    Spouse Name: N/A  . Number of Children: 6  . Years of Education: 12   Occupational History  . 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 Smoking status: Never Smoker   . Smokeless tobacco: Never Used  . Alcohol Use: No  . Drug Use: No  . Sexual Activity: Yes    Birth Control/ Protection: Condom   Other Topics Concern  . Not on  file   Social History Narrative    Current Outpatient Prescriptions on File Prior to Visit  Medication Sig Dispense Refill  . Acetaminophen (TYLENOL PO) Take by mouth as needed.    Marland Kitchen aspirin 81 MG tablet Take 81 mg by mouth daily.    . carvedilol (COREG) 25 MG tablet TAKE ONE TABLET BY MOUTH TWICE DAILY WITH  A  MEAL 180 tablet 3  . digoxin (LANOXIN) 0.25 MG tablet Take 1 tablet (250 mcg total) by mouth daily. 90 tablet 3  . furosemide (LASIX) 40 MG tablet Take 1 tablet (40 mg total) by mouth daily. 90 tablet 3  . hydrALAZINE (APRESOLINE) 50 MG tablet Take 1 tablet (50 mg total) by mouth 2 (two) times daily. 180 tablet 3  . isosorbide mononitrate (IMDUR) 30 MG 24 hr tablet Take 1 tablet (30 mg total) by mouth daily. 90 tablet 3  . losartan (COZAAR) 100 MG tablet Take 1 tablet (100 mg total) by mouth daily. 90 tablet 3  . ONETOUCH VERIO test strip USE DAILY 100 each 0  . pantoprazole (PROTONIX) 40 MG tablet TAKE ONE TABLET BY MOUTH ONCE DAILY 30 tablet 11  . spironolactone (ALDACTONE) 25 MG tablet Take 1 tablet (25 mg total) by mouth daily. 90 tablet 3   No current facility-administered medications on file prior to visit.    No Known Allergies  Family History  Problem Relation Age of Onset  . Coronary artery disease Brother     CABG at 57, DM  . Diabetes Mother   . Hypertension Mother   . Diabetes Father     BP 160/110 mmHg  Pulse 94  Wt 300 lb (136.079 kg)  SpO2 95%   Review of Systems He has lost a few lbs.      Objective:   Physical Exam VITAL SIGNS:  See vs page GENERAL: no distress Pulses: dorsalis pedis intact bilat.   MSK: no deformity of the feet CV: no leg edema Skin:  no ulcer on the feet.  normal color and temp on the feet. Neuro: sensation is intact to touch on the feet  A1c=15%    Assessment & Plan:  Insulin-requiring type 2 DM: severe exacerbation.  He agrees to take insulin HTN: poor control Noncompliance with cbg recording meds, and f/u appts:  I'll work around this as best I can  Patient is advised the following: Patient Instructions  please call [phone] ([place] physician referral line), to get an appointment with a new primary doctor. good diet and exercise significantly improve the control of your diabetes.  please let me know if you wish to be referred to a dietician.  high blood sugar is very risky to your health.  you should see an eye doctor and dentist every year.  It is very important to get all recommended vaccinations.  Please go back to Dr Gwenlyn Found soon, to recheck your blood pressure I have sent a prescription to your pharmacy, for insulin. check your blood sugar twice a day.  vary the time of day when you check, between before the 3 meals, and at bedtime.  also check if you have symptoms of your blood sugar being too high or too low.  please keep a record of the readings and bring it to your next appointment here (or you can bring the meter itself).  You can write it on any piece of paper.  please call us sooner if your blood sugar goes below 70, or if you have a lot of readings over 200. Please call us next week, to tell us how your blood sugar is doing.  Please come back for a follow-up appointment in 2 weeks.  Renato Shin, MD

## 2015-12-30 NOTE — Patient Instructions (Addendum)
please call [phone] ( physician referral line), to get an appointment with a new primary doctor. good diet and exercise significantly improve the control of your diabetes.  please let me know if you wish to be referred to a dietician.  high blood sugar is very risky to your health.  you should see an eye doctor and dentist every year.  It is very important to get all recommended vaccinations.  Please go back to [facility]sey soon, to recheck your blood pressure I have sent a prescription to your pharmacy, for insulin. check your blood sugar twice a day.  vary the time of day when you check, between before the 3 meals, and at bedtime.  also check if you have symptoms of your blood sugar being too high or too low.  please keep a record of the readings and bring it to your next appointment here (or you can bring the meter itself).  You can write it on any piece of paper.  please call us sooner if your blood sugar goes below 70, or if you have a lot of readings over 200. Please call us next week, to tell us how your blood sugar is doing.  Please come back for a follow-up appointment in 2 weeks.

## 2016-01-13 ENCOUNTER — Ambulatory Visit: Payer: Self-pay | Admitting: Endocrinology

## 2016-01-13 DIAGNOSIS — Z0289 Encounter for other administrative examinations: Secondary | ICD-10-CM

## 2016-02-03 ENCOUNTER — Ambulatory Visit: Payer: Self-pay | Admitting: Endocrinology

## 2016-02-03 DIAGNOSIS — Z0289 Encounter for other administrative examinations: Secondary | ICD-10-CM

## 2016-03-03 ENCOUNTER — Telehealth: Payer: Self-pay | Admitting: Endocrinology

## 2016-03-03 DIAGNOSIS — I10 Essential (primary) hypertension: Secondary | ICD-10-CM

## 2016-03-03 NOTE — Telephone Encounter (Signed)
PT requested his fluid pills be refilled and sent into the [facility]

## 2016-03-04 NOTE — Telephone Encounter (Signed)
See message and please advise if ok to refill. Medication is listed under Dr. Gwenlyn Found at this time.

## 2016-03-04 NOTE — Telephone Encounter (Signed)
I only see pt for DM. F/u ov is due

## 2016-03-04 NOTE — Telephone Encounter (Signed)
I contacted the patient and advised of message. Patient voiced understanding and schedule his appointment for 03/11/2016.

## 2016-03-09 ENCOUNTER — Telehealth: Payer: Self-pay | Admitting: Cardiovascular Disease

## 2016-03-09 DIAGNOSIS — I10 Essential (primary) hypertension: Secondary | ICD-10-CM

## 2016-03-09 NOTE — Telephone Encounter (Signed)
New message      *STAT* If patient is at the pharmacy, call can be transferred to refill team.   1. Which medications need to be refilled? (please list name of each medication and dose if known) Furosemide 40 mg  2. Which pharmacy/location (including street and city if local pharmacy) is medication to be sent to? [facility] on [address]  3. Do they need a 30 day or 90 day supply? [facility]

## 2016-03-10 MED ORDER — FUROSEMIDE 40 MG PO TABS: 40.0000 mg | ORAL_TABLET | Freq: Every day | ORAL | 3 refills | Status: DC

## 2016-03-10 NOTE — Telephone Encounter (Signed)
rx refilled.

## 2016-03-11 ENCOUNTER — Ambulatory Visit: Payer: Self-pay | Admitting: Endocrinology

## 2016-03-11 DIAGNOSIS — Z0289 Encounter for other administrative examinations: Secondary | ICD-10-CM

## 2016-04-06 ENCOUNTER — Encounter: Payer: Self-pay | Admitting: Cardiovascular Disease

## 2016-04-06 ENCOUNTER — Ambulatory Visit (INDEPENDENT_AMBULATORY_CARE_PROVIDER_SITE_OTHER): Payer: Self-pay | Admitting: Cardiovascular Disease

## 2016-04-06 VITALS — BP 150/110 | HR 96 | Ht 71.0 in | Wt 322.6 lb

## 2016-04-06 DIAGNOSIS — I5022 Chronic systolic (congestive) heart failure: Secondary | ICD-10-CM

## 2016-04-06 DIAGNOSIS — I10 Essential (primary) hypertension: Secondary | ICD-10-CM

## 2016-04-06 MED ORDER — FUROSEMIDE 40 MG PO TABS: 40.0000 mg | ORAL_TABLET | Freq: Two times a day (BID) | ORAL | 3 refills | Status: DC

## 2016-04-06 NOTE — Assessment & Plan Note (Signed)
History of nonischemic myopathy on appropriate medications. His last echo performed 08/31/12 revealed a moderately dilated left ventricle with mild concentric LVH and ejection fraction of 35-40% with grade 1 diastolic dysfunction. He ran out of his diuretics and other medications for weeks at a time. His weight is up approximately 22 pounds and he felt significant peripheral edema. He does say that he is on a salt restricted diet. I'm going to renew his prescription for Lasix and increase it to twice a day. I will arrange in the CV heart failure clinic within the next week for further evaluation and potential financial assistance to assist with his medications.

## 2016-04-06 NOTE — Patient Instructions (Signed)
Medication Instructions:  Your physician has recommended you make the following change in your medication:  1- INCREASE Lasix to 40 mg (1 tablet) by mouth twice a day.    Follow-Up: You have been referred to THE HEART FAILURE WITH DR Haroldine Laws IN THE NEXT WEEK.  We request that you follow-up in: 4-6 WEEKS with an extender and in 6 MONTHS with Dr Andria Rhein will receive a reminder letter in the mail two months in advance. If you don't receive a letter, please call our office to schedule the follow-up appointment.   If you need a refill on your cardiac medications before your next appointment, please call your pharmacy.

## 2016-04-06 NOTE — Assessment & Plan Note (Signed)
History of hypertension blood pressure measured today at 150/110. He is on carvedilol, hydralazine and losartan as well as spironolactone. He endorses being on a low-salt diet although I'm concerned that he has issues with patient compliance.

## 2016-04-06 NOTE — Progress Notes (Signed)
04/06/2016 Joshua Massey   [birth year]  [id number]  Primary Physician No primary care provider on file. Primary Cardiologist: Joshua Harp MD [name]  HPI:  Joshua Massey is a 40 year old severely overweight single Caucasian male father of 6 children who worked at [employer], and was unfortunately laid off. 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 I last saw him in the office 10/30/15. He was previously a patient of Joshua Massey and has seen  Joshua Massey in the heart failure clinic in the past as well. He has what is presumed to be hypertensive heart disease. He underwent right and left heart cath by Joshua Massey 03/19/12 revealing normal coronary arteries and elevated filling pressures. i last saw him in the office 2 years ago. He has repeatedly running out of his medications and has a 22 pound weight gain over the last several months. He does have increasing peripheral edema but denies shortness of breath.   Current Outpatient Prescriptions  Medication Sig Dispense Refill  . Acetaminophen (TYLENOL PO) Take by mouth as needed.    Marland Kitchen aspirin 81 MG tablet Take 81 mg by mouth daily.    . carvedilol (COREG) 25 MG tablet TAKE ONE TABLET BY MOUTH TWICE DAILY WITH  A  MEAL 180 tablet 3  . digoxin (LANOXIN) 0.25 MG tablet Take 1 tablet (250 mcg total) by mouth daily. 90 tablet 3  . furosemide (LASIX) 40 MG tablet Take 1 tablet (40 mg total) by mouth daily. 3 tablet 3  . hydrALAZINE (APRESOLINE) 50 MG tablet Take 1 tablet (50 mg total) by mouth 2 (two) times daily. 180 tablet 3  . Insulin Glargine (LANTUS SOLOSTAR) 100 UNIT/ML Solostar Pen Inject 30 Units into the skin every morning. And pen needles 1/day 5 pen PRN  . isosorbide mononitrate (IMDUR) 30 MG 24 hr tablet Take 1 tablet (30 mg total) by mouth daily. 90 tablet 3  . losartan (COZAAR) 100 MG tablet Take 1 tablet (100 mg total) by mouth daily. 90 tablet 3  . ONETOUCH VERIO test strip USE DAILY 100  each 0  . pantoprazole (PROTONIX) 40 MG tablet TAKE ONE TABLET BY MOUTH ONCE DAILY 30 tablet 11  . spironolactone (ALDACTONE) 25 MG tablet Take 1 tablet (25 mg total) by mouth daily. 90 tablet 3   No current facility-administered medications for this visit.     No Known Allergies  Social History   Social History  . Marital status: Single    Spouse name: N/A  . Number of children: 6  . Years of education: 78   Occupational History  . meter checker     Joshua Massey  . 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 Smoking status: Never Smoker  . Smokeless tobacco: Never Used  . Alcohol use No  . Drug use: No  . Sexual activity: Yes    Birth control/ protection: Condom   Other Topics Concern  . Not on file   Social History Narrative  . No narrative on file     Review of Systems: General: negative for chills, fever, night sweats or weight changes.  Cardiovascular: negative for chest pain, dyspnea on exertion, edema, orthopnea, palpitations, paroxysmal nocturnal dyspnea or shortness of breath Dermatological: negative for rash Respiratory: negative for cough or wheezing Urologic: negative for hematuria Abdominal: negative for nausea, vomiting, diarrhea, bright red blood per rectum, melena, or hematemesis  Neurologic: negative for visual changes, syncope, or dizziness All other systems reviewed and are otherwise negative except as noted above.    Blood pressure (!) 150/110, pulse 96, height 5\' 11"  (1.803 m), weight (!) 322 lb 9.6 oz (146.3 kg).  General appearance: alert and no distress Neck: no adenopathy, no carotid bruit, no JVD, supple, symmetrical, trachea midline and thyroid not enlarged, symmetric, no tenderness/mass/nodules Lungs: clear to auscultation bilaterally Heart: regular rate and rhythm, S1, S2 normal, no murmur, click, rub or gallop Extremities: 1-2+ pitting edema bilaterally  EKG performed today  ASSESSMENT AND PLAN:    Non-ischemic cardiomyopathy, EF < 20% History of nonischemic myopathy on appropriate medications. His last echo performed 08/31/12 revealed a moderately dilated left ventricle with mild concentric LVH and ejection fraction of 35-40% with grade 1 diastolic dysfunction. He ran out of his diuretics and other medications for weeks at a time. His weight is up approximately 22 pounds and he felt significant peripheral edema. He does say that he is on a salt restricted diet. I'm going to renew his prescription for Lasix and increase it to twice a day. I will arrange in the CV heart failure clinic within the next week for further evaluation and potential financial assistance to assist with his medications.  Essential hypertension History of hypertension blood pressure measured today at 150/110. He is on carvedilol, hydralazine and losartan as well as spironolactone. He endorses being on a low-salt diet although I'm concerned that he has issues with patient compliance.      Joshua Harp MD FACP,FACC,FAHA, [facility] 04/06/2016 10:30 AM

## 2016-04-18 ENCOUNTER — Inpatient Hospital Stay (HOSPITAL_COMMUNITY): Admission: RE | Admit: 2016-04-18 | Payer: Self-pay | Source: Ambulatory Visit

## 2016-05-11 ENCOUNTER — Ambulatory Visit: Payer: Self-pay | Admitting: Nurse Practitioner

## 2016-08-16 ENCOUNTER — Emergency Department (HOSPITAL_COMMUNITY): Payer: BLUE CROSS/BLUE SHIELD

## 2016-08-16 ENCOUNTER — Encounter (HOSPITAL_COMMUNITY): Payer: Self-pay | Admitting: Emergency Medicine

## 2016-08-16 ENCOUNTER — Inpatient Hospital Stay (HOSPITAL_COMMUNITY)
Admission: EM | Admit: 2016-08-16 | Discharge: 2016-09-02 | DRG: 981 | Disposition: A | Discharge status: Home Health | Payer: BLUE CROSS/BLUE SHIELD | Attending: Internal Medicine | Admitting: Internal Medicine

## 2016-08-16 DIAGNOSIS — I110 Hypertensive heart disease with heart failure: Secondary | ICD-10-CM | POA: Diagnosis present

## 2016-08-16 DIAGNOSIS — I472 Ventricular tachycardia: Secondary | ICD-10-CM | POA: Diagnosis not present

## 2016-08-16 DIAGNOSIS — I5021 Acute systolic (congestive) heart failure: Secondary | ICD-10-CM | POA: Diagnosis present

## 2016-08-16 DIAGNOSIS — K219 Gastro-esophageal reflux disease without esophagitis: Secondary | ICD-10-CM | POA: Diagnosis present

## 2016-08-16 DIAGNOSIS — D509 Iron deficiency anemia, unspecified: Secondary | ICD-10-CM | POA: Diagnosis present

## 2016-08-16 DIAGNOSIS — I313 Pericardial effusion (noninflammatory): Secondary | ICD-10-CM | POA: Diagnosis present

## 2016-08-16 DIAGNOSIS — I428 Other cardiomyopathies: Secondary | ICD-10-CM | POA: Diagnosis present

## 2016-08-16 DIAGNOSIS — R52 Pain, unspecified: Secondary | ICD-10-CM

## 2016-08-16 DIAGNOSIS — B950 Streptococcus, group A, as the cause of diseases classified elsewhere: Secondary | ICD-10-CM | POA: Diagnosis present

## 2016-08-16 DIAGNOSIS — E873 Alkalosis: Secondary | ICD-10-CM | POA: Diagnosis not present

## 2016-08-16 DIAGNOSIS — L03116 Cellulitis of left lower limb: Secondary | ICD-10-CM

## 2016-08-16 DIAGNOSIS — I5023 Acute on chronic systolic (congestive) heart failure: Secondary | ICD-10-CM | POA: Diagnosis present

## 2016-08-16 DIAGNOSIS — Z79899 Other long term (current) drug therapy: Secondary | ICD-10-CM

## 2016-08-16 DIAGNOSIS — I10 Essential (primary) hypertension: Secondary | ICD-10-CM

## 2016-08-16 DIAGNOSIS — R238 Other skin changes: Secondary | ICD-10-CM | POA: Diagnosis present

## 2016-08-16 DIAGNOSIS — Z9114 Patient's other noncompliance with medication regimen: Secondary | ICD-10-CM

## 2016-08-16 DIAGNOSIS — M009 Pyogenic arthritis, unspecified: Secondary | ICD-10-CM | POA: Diagnosis not present

## 2016-08-16 DIAGNOSIS — M9420 Chondromalacia, unspecified site: Secondary | ICD-10-CM | POA: Diagnosis present

## 2016-08-16 DIAGNOSIS — I130 Hypertensive heart and chronic kidney disease with heart failure and stage 1 through stage 4 chronic kidney disease, or unspecified chronic kidney disease: Principal | ICD-10-CM | POA: Diagnosis present

## 2016-08-16 DIAGNOSIS — L97929 Non-pressure chronic ulcer of unspecified part of left lower leg with unspecified severity: Secondary | ICD-10-CM | POA: Diagnosis present

## 2016-08-16 DIAGNOSIS — I5043 Acute on chronic combined systolic (congestive) and diastolic (congestive) heart failure: Secondary | ICD-10-CM | POA: Diagnosis present

## 2016-08-16 DIAGNOSIS — I272 Pulmonary hypertension, unspecified: Secondary | ICD-10-CM | POA: Diagnosis present

## 2016-08-16 DIAGNOSIS — Z9111 Patient's noncompliance with dietary regimen: Secondary | ICD-10-CM

## 2016-08-16 DIAGNOSIS — I509 Heart failure, unspecified: Secondary | ICD-10-CM

## 2016-08-16 DIAGNOSIS — E11622 Type 2 diabetes mellitus with other skin ulcer: Secondary | ICD-10-CM | POA: Diagnosis present

## 2016-08-16 DIAGNOSIS — Z6841 Body Mass Index (BMI) 40.0 and over, adult: Secondary | ICD-10-CM

## 2016-08-16 DIAGNOSIS — R609 Edema, unspecified: Secondary | ICD-10-CM | POA: Diagnosis not present

## 2016-08-16 DIAGNOSIS — S83289A Other tear of lateral meniscus, current injury, unspecified knee, initial encounter: Secondary | ICD-10-CM | POA: Diagnosis present

## 2016-08-16 DIAGNOSIS — N179 Acute kidney failure, unspecified: Secondary | ICD-10-CM | POA: Diagnosis present

## 2016-08-16 DIAGNOSIS — N184 Chronic kidney disease, stage 4 (severe): Secondary | ICD-10-CM

## 2016-08-16 DIAGNOSIS — E1122 Type 2 diabetes mellitus with diabetic chronic kidney disease: Secondary | ICD-10-CM | POA: Diagnosis present

## 2016-08-16 DIAGNOSIS — R319 Hematuria, unspecified: Secondary | ICD-10-CM | POA: Diagnosis present

## 2016-08-16 DIAGNOSIS — E1165 Type 2 diabetes mellitus with hyperglycemia: Secondary | ICD-10-CM | POA: Diagnosis present

## 2016-08-16 DIAGNOSIS — Z7982 Long term (current) use of aspirin: Secondary | ICD-10-CM

## 2016-08-16 DIAGNOSIS — Z794 Long term (current) use of insulin: Secondary | ICD-10-CM

## 2016-08-16 DIAGNOSIS — E1159 Type 2 diabetes mellitus with other circulatory complications: Secondary | ICD-10-CM

## 2016-08-16 LAB — CBC
HCT: 37.1 % — ABNORMAL LOW (ref 39.0–52.0)
Hemoglobin: 12.2 g/dL — ABNORMAL LOW (ref 13.0–17.0)
MCH: 28.7 pg (ref 26.0–34.0)
MCHC: 32.9 g/dL (ref 30.0–36.0)
MCV: 87.3 fL (ref 78.0–100.0)
PLATELETS: 338 10*3/uL (ref 150–400)
RBC: 4.25 MIL/uL (ref 4.22–5.81)
RDW: 16.5 % — AB (ref 11.5–15.5)
WBC: 15.6 10*3/uL — ABNORMAL HIGH (ref 4.0–10.5)

## 2016-08-16 LAB — BASIC METABOLIC PANEL
Anion gap: 9 (ref 5–15)
BUN: 50 mg/dL — ABNORMAL HIGH (ref 6–20)
CHLORIDE: 106 mmol/L (ref 101–111)
CO2: 24 mmol/L (ref 22–32)
CREATININE: 2.49 mg/dL — AB (ref 0.61–1.24)
Calcium: 7.8 mg/dL — ABNORMAL LOW (ref 8.9–10.3)
GFR calc non Af Amer: 31 mL/min — ABNORMAL LOW (ref >60)
GFR, EST AFRICAN AMERICAN: 36 mL/min — AB (ref >60)
Glucose, Bld: 206 mg/dL — ABNORMAL HIGH (ref 65–99)
Potassium: 4.1 mmol/L (ref 3.5–5.1)
Sodium: 139 mmol/L (ref 135–145)

## 2016-08-16 LAB — BRAIN NATRIURETIC PEPTIDE: B Natriuretic Peptide: 2062.6 pg/mL — ABNORMAL HIGH (ref 0.0–100.0)

## 2016-08-16 LAB — I-STAT TROPONIN, ED: TROPONIN I, POC: 0 ng/mL (ref 0.00–0.08)

## 2016-08-16 MED ORDER — FUROSEMIDE 10 MG/ML IJ SOLN
40.0000 mg | Freq: Once | INTRAMUSCULAR | Status: AC
  Administered 2016-08-17: 40 mg via INTRAVENOUS
  Filled 2016-08-16: qty 4

## 2016-08-16 MED ORDER — NITROGLYCERIN 2 % TD OINT
1.0000 [in_us] | TOPICAL_OINTMENT | Freq: Once | TRANSDERMAL | Status: AC
  Administered 2016-08-17: 1 [in_us] via TOPICAL
  Filled 2016-08-16: qty 1

## 2016-08-16 NOTE — ED Notes (Signed)
Upgraded patient acuity due to BNP levels

## 2016-08-16 NOTE — ED Provider Notes (Signed)
[facility] DEPT Provider Note   CSN: [id number] Arrival date & time: 08/16/16  2047  By signing my name below, I, Oleh Genin, attest that this documentation has been prepared under the direction and in the presence of Ripley Fraise, MD. Electronically Signed: Oleh Genin, Scribe. 08/16/16. 11:33 PM.   History   Chief Complaint Chief Complaint  Patient presents with  . Leg Swelling    HPI JAYMEN FETCH is a 41 y.o. male with history of HTN, nonischemic cardiomyopathy, CHF, CAD, diabetes, and obesity who presents to the ED for evaluation of peripheral edema. This patient states that for the last "2 months" he has experienced worsening abdominal distention, dyspnea on exertion, and bilateral leg swelling which he attributes to fluid retention. He has also developed wounds over his L lower leg which is painful. Worse on palpation. He was last seen by his cardiologist in 03/2016 who increased Lasix to 40mg  BID. However he states that he has gained over 30lbs since that appointment despite this therapy. He denies any chest pain, abdominal pain, or vomiting. No lightheadedness or syncope.  The history is provided by the patient. No language interpreter was used.    Past Medical History:  Diagnosis Date  . 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 Diabetes mellitus    adult-onset, type 2  . Exogenous obesity   . GERD (gastroesophageal reflux disease)   . Hypertension     Patient Active Problem List   Diagnosis Date Noted  . Diabetes ([place]) 10/21/2013  . Chronic systolic heart failure ([place]) 02/27/2012  . Coronary artery disease, 50% LAD at cath 02/16/12 02/23/2012  . Family history of coronary artery disease 02/23/2012  . Cardiogenic shock ([place]) 02/17/2012  . NSVT (nonsustained ventricular tachycardia) ([place]) 02/16/2012  . Obesity 02/16/2012  . Acute on chronic systolic heart failure ([place]) 02/16/2012  . CHF (congestive heart failure) ([place])  02/15/2012  . Hypertension 02/15/2012  . Non-ischemic cardiomyopathy, EF < 20% 02/15/2012  . TINEA CRURIS 09/25/2009  . ALLERGIC RHINITIS CAUSE UNSPECIFIED 09/25/2009  . SCROTAL ABSCESS 09/17/2009  . Essential hypertension 01/26/2009    Past Surgical History:  Procedure Laterality Date  . ANKLE SURGERY  2003   plate and screws  . CARDIAC CATHETERIZATION  02/16/2012   50% LAD lesion in mid vessel, otherwise no significant obstructive CAD  . HIP PINNING  1990   bilateral  . LEFT AND RIGHT HEART CATHETERIZATION WITH CORONARY ANGIOGRAM N/A 02/16/2012   Procedure: LEFT AND RIGHT HEART CATHETERIZATION WITH CORONARY ANGIOGRAM;  Surgeon: Pixie Casino, MD;  Location: [facility] CATH LAB;  Service: Cardiovascular;  Laterality: N/A;  . TRANSTHORACIC ECHOCARDIOGRAM  08/31/2012   EF 35-40%, no significant wall motion abnormalities, diastolic relaxation abnormality - no longer needed LifeVest; LV cavity size mod dilated with mod conc hypertrophy, systolic function mod reduced; mild MR, LA mod dilated       Home Medications    Prior to Admission medications   Medication Sig Start Date End Date Taking? Authorizing Provider  Acetaminophen (TYLENOL PO) Take by mouth as needed.    Historical Provider, MD  aspirin 81 MG tablet Take 81 mg by mouth daily.    Historical Provider, MD  carvedilol (COREG) 25 MG tablet TAKE ONE TABLET BY MOUTH TWICE DAILY WITH  A  MEAL 10/30/15   Lorretta Harp, MD  digoxin (LANOXIN) 0.25 MG tablet Take 1 tablet (250 mcg total) by mouth daily. 10/30/15   Lorretta Harp, MD  furosemide (  LASIX) 40 MG tablet Take 1 tablet (40 mg total) by mouth 2 (two) times daily. 04/06/16   Lorretta Harp, MD  hydrALAZINE (APRESOLINE) 50 MG tablet Take 1 tablet (50 mg total) by mouth 2 (two) times daily. 10/30/15   Lorretta Harp, MD  Insulin Glargine (LANTUS SOLOSTAR) 100 UNIT/ML Solostar Pen Inject 30 Units into the skin every morning. And pen needles 1/day 12/30/15   Renato Shin, MD    isosorbide mononitrate (IMDUR) 30 MG 24 hr tablet Take 1 tablet (30 mg total) by mouth daily. 10/30/15   Lorretta Harp, MD  losartan (COZAAR) 100 MG tablet Take 1 tablet (100 mg total) by mouth daily. 10/30/15   Lorretta Harp, MD  [facility] VERIO test strip USE DAILY 02/12/15   Renato Shin, MD  pantoprazole (PROTONIX) 40 MG tablet TAKE ONE TABLET BY MOUTH ONCE DAILY 12/21/15   Lorretta Harp, MD  spironolactone (ALDACTONE) 25 MG tablet Take 1 tablet (25 mg total) by mouth daily. 10/30/15   Lorretta Harp, MD    Family History Family History  Problem Relation Age of Onset  . Coronary artery disease Brother     CABG at 55, DM  . Diabetes Mother   . Hypertension Mother   . Diabetes Father     Social History Social History  Substance Use Topics  . Smoking status: Never Smoker  . Smokeless tobacco: Never Used  . Alcohol use No     Allergies   Patient has no known allergies.   Review of Systems Review of Systems  Respiratory: Positive for shortness of breath.   Cardiovascular: Positive for leg swelling. Negative for chest pain.  Gastrointestinal: Positive for abdominal distention. Negative for abdominal pain and vomiting.  Skin: Positive for wound.  Neurological: Negative for syncope and light-headedness.  All other systems reviewed and are negative.    Physical Exam Updated Vital Signs BP 139/90 (BP Location: Left Arm)   Pulse 86   Temp 98.6 F (37 C) (Oral)   Resp 20   Ht 5\' 11"  (1.803 m)   Wt (!) 365 lb 6.4 oz (165.7 kg)   SpO2 96%   BMI 50.96 kg/m   Physical Exam CONSTITUTIONAL: Well developed/well nourished HEAD: Normocephalic/atraumatic EYES: EOMI/PERRL ENMT: Mucous membranes moist NECK: supple no meningeal signs SPINE/BACK:entire spine nontender CV: S1/S2 noted, no murmurs/rubs/gallops noted LUNGS: Distant lung sounds noted no distress noted ABDOMEN: soft, nontender, no rebound or guarding, bowel sounds noted throughout abdomen; obese GU:no cva  tenderness NEURO: Pt is awake/alert/appropriate, moves all extremitiesx4.  No facial droop.   EXTREMITIES: pulses normal/equal, full ROM; significant pitting edema noted with erythematous weeping wounds on the L lower leg; No crepitus.  SKIN: warm, color normal PSYCH: no abnormalities of mood noted, alert and oriented to situation  ED Treatments / Results  DIAGNOSTIC STUDIES: Oxygen Saturation is 96 percent on room air which is normal by my interpretation.    COORDINATION OF CARE: 11:29 PM Discussed treatment plan with pt at bedside and pt agreed to plan.  Labs (all labs ordered are listed, but only abnormal results are displayed) Labs Reviewed  BASIC METABOLIC PANEL - Abnormal; Notable for the following:       Result Value   Glucose, Bld 206 (*)    BUN 50 (*)    Creatinine, Ser 2.49 (*)    Calcium 7.8 (*)    GFR calc non Af Amer 31 (*)    GFR calc Af Amer 36 (*)  All other components within normal limits  CBC - Abnormal; Notable for the following:    WBC 15.6 (*)    Hemoglobin 12.2 (*)    HCT 37.1 (*)    RDW 16.5 (*)    All other components within normal limits  BRAIN NATRIURETIC PEPTIDE - Abnormal; Notable for the following:    B Natriuretic Peptide 2,062.6 (*)    All other components within normal limits  DIGOXIN LEVEL  URINALYSIS, ROUTINE W REFLEX MICROSCOPIC  SODIUM, URINE, RANDOM  CREATININE, URINE, RANDOM  I-STAT TROPOININ, ED    EKG  EKG Interpretation  Date/Time:  Tuesday August 16 2016 20:57:48 EST Ventricular Rate:  127 PR Interval:  176 QRS Duration: 102 QT Interval:  274 QTC Calculation: 398 R Axis:   1 Text Interpretation:  Sinus tachycardia Low voltage QRS Cannot rule out Anterior infarct , age undetermined Abnormal ECG Confirmed by Christy Gentles  MD, Berdene Askari ([id number]) on 08/16/2016 11:26:11 PM       Radiology Dg Chest 2 View  Result Date: 08/16/2016 CLINICAL DATA:  Shortness of breath tonight. EXAM: CHEST  2 VIEW COMPARISON:  02/14/2012 FINDINGS:  Moderate-to-marked cardiomegaly which is stable. Central vascular congestion and pulmonary edema. Probable left pleural effusion and minimal fluid in the fissures. No pneumothorax or confluent airspace disease. IMPRESSION: Findings consistent with CHF. Electronically Signed   By: Jeb Levering M.D.   On: 08/16/2016 22:01    Procedures Procedures (including critical care time)  Medications Ordered in ED Medications  doxycycline (VIBRA-TABS) tablet 100 mg (not administered)  nitroGLYCERIN (NITROGLYN) 2 % ointment 1 inch (1 inch Topical Given 08/17/16 0004)  furosemide (LASIX) injection 40 mg (40 mg Intravenous Given 08/17/16 0004)     Initial Impression / Assessment and Plan / ED Course  I have reviewed the triage vital signs and the nursing notes.  Pertinent labs & imaging results that were available during my care of the patient were reviewed by me and considered in my medical decision making (see chart for details).     Pt with worsening LE edema and now with pulmonary edema likely due to noncompliance in the setting of nonischemic cardiomyopathy IV lasix and NTG ordered Also may have early cellulitis to left LE, will place on doxycycline D/w dr Hal Hope for admission   Final Clinical Impressions(s) / ED Diagnoses   Final diagnoses:  AKI (acute kidney injury) ([place])  Acute systolic congestive heart failure ([place])  Cellulitis of left lower extremity  Peripheral edema    New Prescriptions New Prescriptions   No medications on file  I personally performed the services described in this documentation, which was scribed in my presence. The recorded information has been reviewed and is accurate.       Ripley Fraise, MD 08/17/16 [phone]

## 2016-08-16 NOTE — ED Triage Notes (Signed)
Pt states he is having increase on fluid retention his abd is bigger, legs are swollen with blisters, having 5/10 pain on his legs, pt states MD increased his water pill to 40 mg twice a day but is not helping him. Denies any pain at this time.

## 2016-08-17 ENCOUNTER — Inpatient Hospital Stay (HOSPITAL_COMMUNITY): Payer: BLUE CROSS/BLUE SHIELD

## 2016-08-17 ENCOUNTER — Encounter (HOSPITAL_COMMUNITY): Payer: Self-pay | Admitting: Internal Medicine

## 2016-08-17 DIAGNOSIS — E1165 Type 2 diabetes mellitus with hyperglycemia: Secondary | ICD-10-CM | POA: Diagnosis present

## 2016-08-17 DIAGNOSIS — R319 Hematuria, unspecified: Secondary | ICD-10-CM | POA: Diagnosis present

## 2016-08-17 DIAGNOSIS — N184 Chronic kidney disease, stage 4 (severe): Secondary | ICD-10-CM | POA: Diagnosis present

## 2016-08-17 DIAGNOSIS — E669 Obesity, unspecified: Secondary | ICD-10-CM | POA: Diagnosis not present

## 2016-08-17 DIAGNOSIS — Z95828 Presence of other vascular implants and grafts: Secondary | ICD-10-CM | POA: Diagnosis not present

## 2016-08-17 DIAGNOSIS — I313 Pericardial effusion (noninflammatory): Secondary | ICD-10-CM | POA: Diagnosis present

## 2016-08-17 DIAGNOSIS — K219 Gastro-esophageal reflux disease without esophagitis: Secondary | ICD-10-CM | POA: Diagnosis present

## 2016-08-17 DIAGNOSIS — A498 Other bacterial infections of unspecified site: Secondary | ICD-10-CM | POA: Diagnosis not present

## 2016-08-17 DIAGNOSIS — I10 Essential (primary) hypertension: Secondary | ICD-10-CM

## 2016-08-17 DIAGNOSIS — N183 Chronic kidney disease, stage 3 unspecified: Secondary | ICD-10-CM | POA: Diagnosis present

## 2016-08-17 DIAGNOSIS — I5043 Acute on chronic combined systolic (congestive) and diastolic (congestive) heart failure: Secondary | ICD-10-CM | POA: Diagnosis present

## 2016-08-17 DIAGNOSIS — E1122 Type 2 diabetes mellitus with diabetic chronic kidney disease: Secondary | ICD-10-CM | POA: Diagnosis present

## 2016-08-17 DIAGNOSIS — I272 Pulmonary hypertension, unspecified: Secondary | ICD-10-CM | POA: Diagnosis present

## 2016-08-17 DIAGNOSIS — M009 Pyogenic arthritis, unspecified: Secondary | ICD-10-CM | POA: Diagnosis not present

## 2016-08-17 DIAGNOSIS — B950 Streptococcus, group A, as the cause of diseases classified elsewhere: Secondary | ICD-10-CM | POA: Diagnosis present

## 2016-08-17 DIAGNOSIS — I509 Heart failure, unspecified: Secondary | ICD-10-CM | POA: Diagnosis not present

## 2016-08-17 DIAGNOSIS — E873 Alkalosis: Secondary | ICD-10-CM | POA: Diagnosis not present

## 2016-08-17 DIAGNOSIS — E1159 Type 2 diabetes mellitus with other circulatory complications: Secondary | ICD-10-CM | POA: Diagnosis not present

## 2016-08-17 DIAGNOSIS — I428 Other cardiomyopathies: Secondary | ICD-10-CM | POA: Diagnosis present

## 2016-08-17 DIAGNOSIS — M01X0 Direct infection of unspecified joint in infectious and parasitic diseases classified elsewhere: Secondary | ICD-10-CM | POA: Diagnosis not present

## 2016-08-17 DIAGNOSIS — R609 Edema, unspecified: Secondary | ICD-10-CM

## 2016-08-17 DIAGNOSIS — Z9114 Patient's other noncompliance with medication regimen: Secondary | ICD-10-CM | POA: Diagnosis not present

## 2016-08-17 DIAGNOSIS — I5021 Acute systolic (congestive) heart failure: Secondary | ICD-10-CM | POA: Diagnosis present

## 2016-08-17 DIAGNOSIS — N179 Acute kidney failure, unspecified: Secondary | ICD-10-CM | POA: Diagnosis present

## 2016-08-17 DIAGNOSIS — Z9889 Other specified postprocedural states: Secondary | ICD-10-CM | POA: Diagnosis not present

## 2016-08-17 DIAGNOSIS — I130 Hypertensive heart and chronic kidney disease with heart failure and stage 1 through stage 4 chronic kidney disease, or unspecified chronic kidney disease: Secondary | ICD-10-CM | POA: Diagnosis present

## 2016-08-17 DIAGNOSIS — I472 Ventricular tachycardia: Secondary | ICD-10-CM | POA: Diagnosis not present

## 2016-08-17 DIAGNOSIS — R238 Other skin changes: Secondary | ICD-10-CM | POA: Diagnosis present

## 2016-08-17 DIAGNOSIS — I5023 Acute on chronic systolic (congestive) heart failure: Secondary | ICD-10-CM | POA: Diagnosis not present

## 2016-08-17 DIAGNOSIS — Z79899 Other long term (current) drug therapy: Secondary | ICD-10-CM | POA: Diagnosis not present

## 2016-08-17 DIAGNOSIS — L97929 Non-pressure chronic ulcer of unspecified part of left lower leg with unspecified severity: Secondary | ICD-10-CM | POA: Diagnosis present

## 2016-08-17 DIAGNOSIS — E11622 Type 2 diabetes mellitus with other skin ulcer: Secondary | ICD-10-CM | POA: Diagnosis present

## 2016-08-17 DIAGNOSIS — M00262 Other streptococcal arthritis, left knee: Secondary | ICD-10-CM | POA: Diagnosis not present

## 2016-08-17 DIAGNOSIS — Z7982 Long term (current) use of aspirin: Secondary | ICD-10-CM | POA: Diagnosis not present

## 2016-08-17 DIAGNOSIS — Z6841 Body Mass Index (BMI) 40.0 and over, adult: Secondary | ICD-10-CM | POA: Diagnosis not present

## 2016-08-17 DIAGNOSIS — Z794 Long term (current) use of insulin: Secondary | ICD-10-CM | POA: Diagnosis not present

## 2016-08-17 LAB — CBC
HEMATOCRIT: 35.5 % — AB (ref 39.0–52.0)
HEMOGLOBIN: 11.7 g/dL — AB (ref 13.0–17.0)
MCH: 28.9 pg (ref 26.0–34.0)
MCHC: 33 g/dL (ref 30.0–36.0)
MCV: 87.7 fL (ref 78.0–100.0)
Platelets: 315 10*3/uL (ref 150–400)
RBC: 4.05 MIL/uL — ABNORMAL LOW (ref 4.22–5.81)
RDW: 16.8 % — ABNORMAL HIGH (ref 11.5–15.5)
WBC: 15.6 10*3/uL — ABNORMAL HIGH (ref 4.0–10.5)

## 2016-08-17 LAB — PROTEIN / CREATININE RATIO, URINE
CREATININE, URINE: 93.17 mg/dL
PROTEIN CREATININE RATIO: 6.27 mg/mg{creat} — AB (ref 0.00–0.15)
TOTAL PROTEIN, URINE: 584 mg/dL

## 2016-08-17 LAB — COMPREHENSIVE METABOLIC PANEL
ALT: 35 U/L (ref 17–63)
AST: 32 U/L (ref 15–41)
Albumin: 1.3 g/dL — ABNORMAL LOW (ref 3.5–5.0)
Alkaline Phosphatase: 274 U/L — ABNORMAL HIGH (ref 38–126)
Anion gap: 9 (ref 5–15)
BILIRUBIN TOTAL: 1.5 mg/dL — AB (ref 0.3–1.2)
BUN: 50 mg/dL — ABNORMAL HIGH (ref 6–20)
CHLORIDE: 107 mmol/L (ref 101–111)
CO2: 22 mmol/L (ref 22–32)
CREATININE: 2.42 mg/dL — AB (ref 0.61–1.24)
Calcium: 7.6 mg/dL — ABNORMAL LOW (ref 8.9–10.3)
GFR calc Af Amer: 37 mL/min — ABNORMAL LOW (ref >60)
GFR, EST NON AFRICAN AMERICAN: 32 mL/min — AB (ref >60)
Glucose, Bld: 198 mg/dL — ABNORMAL HIGH (ref 65–99)
Potassium: 4 mmol/L (ref 3.5–5.1)
Sodium: 138 mmol/L (ref 135–145)
TOTAL PROTEIN: 5.9 g/dL — AB (ref 6.5–8.1)

## 2016-08-17 LAB — CBC WITH DIFFERENTIAL/PLATELET
BASOS PCT: 0 %
Basophils Absolute: 0 10*3/uL (ref 0.0–0.1)
EOS ABS: 0 10*3/uL (ref 0.0–0.7)
Eosinophils Relative: 0 %
HEMATOCRIT: 34.8 % — AB (ref 39.0–52.0)
Hemoglobin: 11.5 g/dL — ABNORMAL LOW (ref 13.0–17.0)
LYMPHS ABS: 0.8 10*3/uL (ref 0.7–4.0)
Lymphocytes Relative: 5 %
MCH: 29 pg (ref 26.0–34.0)
MCHC: 33 g/dL (ref 30.0–36.0)
MCV: 87.9 fL (ref 78.0–100.0)
Monocytes Absolute: 1.5 10*3/uL — ABNORMAL HIGH (ref 0.1–1.0)
Monocytes Relative: 10 %
Neutro Abs: 12.9 10*3/uL — ABNORMAL HIGH (ref 1.7–7.7)
Neutrophils Relative %: 85 %
Platelets: 291 10*3/uL (ref 150–400)
RBC: 3.96 MIL/uL — ABNORMAL LOW (ref 4.22–5.81)
RDW: 16.8 % — AB (ref 11.5–15.5)
WBC: 15.3 10*3/uL — AB (ref 4.0–10.5)

## 2016-08-17 LAB — MAGNESIUM: MAGNESIUM: 2.1 mg/dL (ref 1.7–2.4)

## 2016-08-17 LAB — CREATININE, SERUM
Creatinine, Ser: 2.44 mg/dL — ABNORMAL HIGH (ref 0.61–1.24)
GFR calc Af Amer: 37 mL/min — ABNORMAL LOW (ref >60)
GFR, EST NON AFRICAN AMERICAN: 32 mL/min — AB (ref >60)

## 2016-08-17 LAB — IRON AND TIBC
Iron: 10 ug/dL — ABNORMAL LOW (ref 45–182)
SATURATION RATIOS: 5 % — AB (ref 17.9–39.5)
TIBC: 199 ug/dL — ABNORMAL LOW (ref 250–450)
UIBC: 189 ug/dL

## 2016-08-17 LAB — GLUCOSE, CAPILLARY
GLUCOSE-CAPILLARY: 239 mg/dL — AB (ref 65–99)
Glucose-Capillary: 213 mg/dL — ABNORMAL HIGH (ref 65–99)
Glucose-Capillary: 185 mg/dL — ABNORMAL HIGH (ref 65–99)
Glucose-Capillary: 195 mg/dL — ABNORMAL HIGH (ref 65–99)
Glucose-Capillary: 202 mg/dL — ABNORMAL HIGH (ref 65–99)

## 2016-08-17 LAB — URINALYSIS, ROUTINE W REFLEX MICROSCOPIC
BACTERIA UA: NONE SEEN
Bilirubin Urine: NEGATIVE
GLUCOSE, UA: 50 mg/dL — AB
KETONES UR: NEGATIVE mg/dL
Leukocytes, UA: NEGATIVE
Nitrite: NEGATIVE
Specific Gravity, Urine: 1.016 (ref 1.005–1.030)
Squamous Epithelial / LPF: NONE SEEN
pH: 5 (ref 5.0–8.0)

## 2016-08-17 LAB — FOLATE: Folate: 5.6 ng/mL — ABNORMAL LOW (ref >5.9)

## 2016-08-17 LAB — TROPONIN I
TROPONIN I: 0.17 ng/mL — AB (ref <0.03)
TROPONIN I: 0.1 ng/mL — AB (ref <0.03)
Troponin I: 0.09 ng/mL (ref <0.03)

## 2016-08-17 LAB — FERRITIN: Ferritin: 239 ng/mL (ref 24–336)

## 2016-08-17 LAB — SODIUM, URINE, RANDOM: SODIUM UR: 53 mmol/L

## 2016-08-17 LAB — VITAMIN B12: VITAMIN B 12: 1298 pg/mL — AB (ref 180–914)

## 2016-08-17 LAB — DIGOXIN LEVEL: Digoxin Level: 0.6 ng/mL — ABNORMAL LOW (ref 0.8–2.0)

## 2016-08-17 LAB — RETICULOCYTES
RBC.: 3.92 MIL/uL — AB (ref 4.22–5.81)
RETIC CT PCT: 0.9 % (ref 0.4–3.1)
Retic Count, Absolute: 35.3 10*3/uL (ref 19.0–186.0)

## 2016-08-17 LAB — ECHOCARDIOGRAM COMPLETE
Height: 71 in
Weight: 5804.8 oz

## 2016-08-17 LAB — HIV ANTIBODY (ROUTINE TESTING W REFLEX): HIV Screen 4th Generation wRfx: NONREACTIVE

## 2016-08-17 LAB — CREATININE, URINE, RANDOM: Creatinine, Urine: 96.87 mg/dL

## 2016-08-17 LAB — TSH: TSH: 2.641 u[IU]/mL (ref 0.350–4.500)

## 2016-08-17 MED ORDER — PERFLUTREN LIPID MICROSPHERE
1.0000 mL | INTRAVENOUS | Status: AC | PRN
  Administered 2016-08-17: 2 mL via INTRAVENOUS
  Filled 2016-08-17: qty 10

## 2016-08-17 MED ORDER — INSULIN ASPART 100 UNIT/ML ~~LOC~~ SOLN
0.0000 [IU] | Freq: Three times a day (TID) | SUBCUTANEOUS | Status: DC
  Administered 2016-08-17: 2 [IU] via SUBCUTANEOUS
  Administered 2016-08-17: 3 [IU] via SUBCUTANEOUS
  Administered 2016-08-18 (×2): 1 [IU] via SUBCUTANEOUS
  Administered 2016-08-18 – 2016-08-21 (×10): 2 [IU] via SUBCUTANEOUS
  Administered 2016-08-22: 3 [IU] via SUBCUTANEOUS
  Administered 2016-08-22 – 2016-08-25 (×11): 2 [IU] via SUBCUTANEOUS
  Administered 2016-08-26: 1 [IU] via SUBCUTANEOUS
  Administered 2016-08-26 – 2016-08-27 (×3): 2 [IU] via SUBCUTANEOUS
  Administered 2016-08-27 (×2): 1 [IU] via SUBCUTANEOUS
  Administered 2016-08-28 (×2): 2 [IU] via SUBCUTANEOUS
  Administered 2016-08-28: 1 [IU] via SUBCUTANEOUS
  Administered 2016-08-29 – 2016-08-30 (×4): 2 [IU] via SUBCUTANEOUS
  Administered 2016-08-31: 3 [IU] via SUBCUTANEOUS
  Administered 2016-08-31: 5 [IU] via SUBCUTANEOUS
  Administered 2016-08-31: 3 [IU] via SUBCUTANEOUS
  Administered 2016-09-01 – 2016-09-02 (×5): 2 [IU] via SUBCUTANEOUS

## 2016-08-17 MED ORDER — HEPARIN SODIUM (PORCINE) 5000 UNIT/ML IJ SOLN
5000.0000 [IU] | Freq: Three times a day (TID) | INTRAMUSCULAR | Status: DC
  Administered 2016-08-17 – 2016-09-02 (×48): 5000 [IU] via SUBCUTANEOUS
  Filled 2016-08-17 (×46): qty 1

## 2016-08-17 MED ORDER — ONDANSETRON HCL 4 MG PO TABS: 4.0000 mg | ORAL_TABLET | Freq: Four times a day (QID) | ORAL | Status: DC | PRN

## 2016-08-17 MED ORDER — DOXYCYCLINE HYCLATE 100 MG IV SOLR
100.0000 mg | Freq: Two times a day (BID) | INTRAVENOUS | Status: DC
  Administered 2016-08-17: 100 mg via INTRAVENOUS
  Filled 2016-08-17: qty 100

## 2016-08-17 MED ORDER — INSULIN GLARGINE 100 UNIT/ML ~~LOC~~ SOLN
5.0000 [IU] | Freq: Every day | SUBCUTANEOUS | Status: DC
  Administered 2016-08-17 – 2016-09-01 (×16): 5 [IU] via SUBCUTANEOUS
  Filled 2016-08-17 (×17): qty 0.05

## 2016-08-17 MED ORDER — FUROSEMIDE 10 MG/ML IJ SOLN
60.0000 mg | Freq: Two times a day (BID) | INTRAMUSCULAR | Status: DC
  Administered 2016-08-17 – 2016-08-20 (×7): 60 mg via INTRAVENOUS
  Filled 2016-08-17 (×7): qty 6

## 2016-08-17 MED ORDER — HYDRALAZINE HCL 20 MG/ML IJ SOLN: 10.0000 mg | INTRAMUSCULAR | Status: DC | PRN

## 2016-08-17 MED ORDER — HYDRALAZINE HCL 50 MG PO TABS
50.0000 mg | ORAL_TABLET | Freq: Two times a day (BID) | ORAL | Status: DC
  Administered 2016-08-17 – 2016-08-28 (×24): 50 mg via ORAL
  Filled 2016-08-17 (×24): qty 1

## 2016-08-17 MED ORDER — ACETAMINOPHEN 325 MG PO TABS
650.0000 mg | ORAL_TABLET | Freq: Four times a day (QID) | ORAL | Status: DC | PRN
  Administered 2016-08-17 – 2016-08-21 (×12): 650 mg via ORAL
  Filled 2016-08-17 (×12): qty 2

## 2016-08-17 MED ORDER — ISOSORBIDE MONONITRATE ER 30 MG PO TB24
30.0000 mg | ORAL_TABLET | Freq: Every day | ORAL | Status: DC
  Administered 2016-08-17 – 2016-08-26 (×10): 30 mg via ORAL
  Filled 2016-08-17 (×10): qty 1

## 2016-08-17 MED ORDER — DOXYCYCLINE HYCLATE 100 MG PO TABS
100.0000 mg | ORAL_TABLET | Freq: Once | ORAL | Status: AC
  Administered 2016-08-17: 100 mg via ORAL
  Filled 2016-08-17: qty 1

## 2016-08-17 MED ORDER — ONDANSETRON HCL 4 MG/2ML IJ SOLN: 4.0000 mg | Freq: Four times a day (QID) | INTRAMUSCULAR | Status: DC | PRN

## 2016-08-17 MED ORDER — ACETAMINOPHEN 650 MG RE SUPP
650.0000 mg | Freq: Four times a day (QID) | RECTAL | Status: DC | PRN
Start: 2016-08-17 — End: 2016-08-21

## 2016-08-17 MED ORDER — ASPIRIN EC 81 MG PO TBEC
81.0000 mg | DELAYED_RELEASE_TABLET | Freq: Every day | ORAL | Status: DC
  Administered 2016-08-17 – 2016-09-02 (×17): 81 mg via ORAL
  Filled 2016-08-17 (×17): qty 1

## 2016-08-17 MED ORDER — PANTOPRAZOLE SODIUM 40 MG PO TBEC
40.0000 mg | DELAYED_RELEASE_TABLET | Freq: Every day | ORAL | Status: DC
  Administered 2016-08-17 – 2016-09-02 (×17): 40 mg via ORAL
  Filled 2016-08-17 (×16): qty 1

## 2016-08-17 MED ORDER — CARVEDILOL 25 MG PO TABS
25.0000 mg | ORAL_TABLET | Freq: Two times a day (BID) | ORAL | Status: DC
  Administered 2016-08-17 – 2016-09-02 (×32): 25 mg via ORAL
  Filled 2016-08-17 (×32): qty 1

## 2016-08-17 NOTE — Progress Notes (Signed)
  Echocardiogram 2D Echocardiogram has been performed.  Jennette Dubin 08/17/2016, 12:14 PM

## 2016-08-17 NOTE — H&P (Signed)
History and Physical    TRUXTON STUPKA BWG:[id number] DOB: 08/13/1975 DOA: 08/16/2016  PCP: Pcp Not In System  Patient coming from: Home.  Chief Complaint: Shortness of breath.  HPI: Joshua Massey is a 41 y.o. male with history of nonischemic cardiomyopathy last EF measured in 2014 was 35-40% presents to the ER because of increasing lower extremity edema and shortness of breath. Patient states that over the last 1 month patient has been having increasing lower extremity edema and has exertional shortness of breath. Denies any chest pain. Patient noticed blisters on his left lower extremity over the last 24 hours. Patient states he has gained at least 50 pounds over the last 1 month. Has been compliant with his diuretics but has stopped taking Lantus last 3 weeks since it was making him uneasy.   ED Course: In the ER chest x-ray shows features concerning for CHF. EKG was showing sinus tachycardia. On exam patient has bilateral lower extremity edema extending up to the thigh. Patient was given Lasix 40 mg IV. Labs revealed acute renal failure  Review of Systems: As per HPI, rest all negative.   Past Medical History:  Diagnosis Date  . 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 Diabetes mellitus    adult-onset, type 2  . Exogenous obesity   . GERD (gastroesophageal reflux disease)   . Hypertension     Past Surgical History:  Procedure Laterality Date  . ANKLE SURGERY  2003   plate and screws  . CARDIAC CATHETERIZATION  02/16/2012   50% LAD lesion in mid vessel, otherwise no significant obstructive CAD  . HIP PINNING  1990   bilateral  . LEFT AND RIGHT HEART CATHETERIZATION WITH CORONARY ANGIOGRAM N/A 02/16/2012   Procedure: LEFT AND RIGHT HEART CATHETERIZATION WITH CORONARY ANGIOGRAM;  Surgeon: Pixie Casino, MD;  Location: [facility] CATH LAB;  Service: Cardiovascular;  Laterality: N/A;  . TRANSTHORACIC ECHOCARDIOGRAM  08/31/2012   EF 35-40%, no significant  wall motion abnormalities, diastolic relaxation abnormality - no longer needed LifeVest; LV cavity size mod dilated with mod conc hypertrophy, systolic function mod reduced; mild MR, LA mod dilated     reports that he has never smoked. He has never used smokeless tobacco. He reports that he does not drink alcohol or use drugs.  No Known Allergies  Family History  Problem Relation Age of Onset  . Coronary artery disease Brother     CABG at 70, DM  . Diabetes Mother   . Hypertension Mother   . Diabetes Father     Prior to Admission medications   Medication Sig Start Date End Date Taking? Authorizing Provider  acetaminophen (TYLENOL) 500 MG tablet Take 1,000 mg by mouth every 6 (six) hours as needed for moderate pain or headache.   Yes Historical Provider, MD  aspirin 81 MG tablet Take 81 mg by mouth daily.   Yes Historical Provider, MD  carvedilol (COREG) 25 MG tablet TAKE ONE TABLET BY MOUTH TWICE DAILY WITH  A  MEAL Patient taking differently: Take 25 mg by mouth 2 (two) times daily with a meal.  10/30/15  Yes Lorretta Harp, MD  digoxin (LANOXIN) 0.25 MG tablet Take 1 tablet (250 mcg total) by mouth daily. 10/30/15  Yes Lorretta Harp, MD  furosemide (LASIX) 40 MG tablet Take 1 tablet (40 mg total) by mouth 2 (two) times daily. 04/06/16  Yes Lorretta Harp, MD  hydrALAZINE (APRESOLINE) 50 MG tablet Take 1  tablet (50 mg total) by mouth 2 (two) times daily. 10/30/15  Yes Lorretta Harp, MD  isosorbide mononitrate (IMDUR) 30 MG 24 hr tablet Take 1 tablet (30 mg total) by mouth daily. 10/30/15  Yes Lorretta Harp, MD  losartan (COZAAR) 100 MG tablet Take 1 tablet (100 mg total) by mouth daily. 10/30/15  Yes Lorretta Harp, MD  spironolactone (ALDACTONE) 25 MG tablet Take 1 tablet (25 mg total) by mouth daily. 10/30/15  Yes Lorretta Harp, MD  Insulin Glargine (LANTUS SOLOSTAR) 100 UNIT/ML Solostar Pen Inject 30 Units into the skin every morning. And pen needles 1/day Patient not  taking: Reported on 08/17/2016 12/30/15   Renato Shin, MD  [facility] VERIO test strip USE DAILY Patient not taking: Reported on 08/17/2016 02/12/15   Renato Shin, MD  pantoprazole (PROTONIX) 40 MG tablet TAKE ONE TABLET BY MOUTH ONCE DAILY Patient not taking: Reported on 08/17/2016 12/21/15   Lorretta Harp, MD    Physical Exam: Vitals:   08/17/16 0030 08/17/16 0045 08/17/16 0100 08/17/16 0227  BP: 123/68 141/75 143/97 129/79  Pulse: 87 88 87 93  Resp:    18  Temp:    98.5 F (36.9 C)  TempSrc:    Oral  SpO2: 95% 97% 95% 97%  Weight:    (!) 164.6 kg (362 lb 12.8 oz)  Height:    5\' 11"  (1.803 m)      Constitutional: Obese not in distress. Vitals:   08/17/16 0030 08/17/16 0045 08/17/16 0100 08/17/16 0227  BP: 123/68 141/75 143/97 129/79  Pulse: 87 88 87 93  Resp:    18  Temp:    98.5 F (36.9 C)  TempSrc:    Oral  SpO2: 95% 97% 95% 97%  Weight:    (!) 164.6 kg (362 lb 12.8 oz)  Height:    5\' 11"  (1.803 m)   Eyes: Anicteric no pallor. ENMT: No discharge from the ears eyes nose or mouth. Neck: JVD elevated felt. Respiratory: No rhonchi. Bibasilar crepitations. Cardiovascular: S1-S2 heard no murmurs appreciated. Abdomen: Soft nontender bowel sounds present. Musculoskeletal: Bilateral lower extremity edema extending up to the thigh. Swelling is more on the left side than the right side. Skin: Blisters on the left leg. Neurologic: Alert awake oriented to time place and person. Moves all extremities. Psychiatric: Appears normal. Normal affect.   Labs on Admission: I have personally reviewed following labs and imaging studies  CBC:  Recent Labs Lab 08/16/16 2055  WBC 15.6*  HGB 12.2*  HCT 37.1*  MCV 87.3  PLT 676   Basic Metabolic Panel:  Recent Labs Lab 08/16/16 2055  NA 139  K 4.1  CL 106  CO2 24  GLUCOSE 206*  BUN 50*  CREATININE 2.49*  CALCIUM 7.8*   GFR: Estimated Creatinine Clearance: 62.5 mL/min (by C-G formula based on SCr of 2.49 mg/dL  (H)). Liver Function Tests: No results for input(s): AST, ALT, ALKPHOS, BILITOT, PROT, ALBUMIN in the last 168 hours. No results for input(s): LIPASE, AMYLASE in the last 168 hours. No results for input(s): AMMONIA in the last 168 hours. Coagulation Profile: No results for input(s): INR, PROTIME in the last 168 hours. Cardiac Enzymes: No results for input(s): CKTOTAL, CKMB, CKMBINDEX, TROPONINI in the last 168 hours. BNP (last 3 results) No results for input(s): PROBNP in the last 8760 hours. HbA1C: No results for input(s): HGBA1C in the last 72 hours. CBG: No results for input(s): GLUCAP in the last 168 hours. Lipid Profile: No  results for input(s): CHOL, HDL, LDLCALC, TRIG, CHOLHDL, LDLDIRECT in the last 72 hours. Thyroid Function Tests: No results for input(s): TSH, T4TOTAL, FREET4, T3FREE, THYROIDAB in the last 72 hours. Anemia Panel: No results for input(s): VITAMINB12, FOLATE, FERRITIN, TIBC, IRON, RETICCTPCT in the last 72 hours. Urine analysis:    Component Value Date/Time   COLORURINE AMBER (A) 08/17/2016 0129   APPEARANCEUR HAZY (A) 08/17/2016 0129   LABSPEC 1.016 08/17/2016 0129   PHURINE 5.0 08/17/2016 0129   GLUCOSEU 50 (A) 08/17/2016 0129   HGBUR SMALL (A) 08/17/2016 0129   BILIRUBINUR NEGATIVE 08/17/2016 0129   KETONESUR NEGATIVE 08/17/2016 0129   PROTEINUR >=300 (A) 08/17/2016 0129   UROBILINOGEN 0.2 09/02/2009 0935   NITRITE NEGATIVE 08/17/2016 0129   LEUKOCYTESUR NEGATIVE 08/17/2016 0129   Sepsis Labs: @LABRCNTIP (procalcitonin:4,lacticidven:4) )No results found for this or any previous visit (from the past 240 hour(s)).   Radiological Exams on Admission: Dg Chest 2 View  Result Date: 08/16/2016 CLINICAL DATA:  Shortness of breath tonight. EXAM: CHEST  2 VIEW COMPARISON:  02/14/2012 FINDINGS: Moderate-to-marked cardiomegaly which is stable. Central vascular congestion and pulmonary edema. Probable left pleural effusion and minimal fluid in the fissures. No  pneumothorax or confluent airspace disease. IMPRESSION: Findings consistent with CHF. Electronically Signed   By: Jeb Levering M.D.   On: 08/16/2016 22:01    EKG: Independently reviewed. Sinus tachycardia.  Assessment/Plan Principal Problem:   Acute on chronic systolic CHF (congestive heart failure) (HCC) Active Problems:   Essential hypertension   CHF (congestive heart failure) (HCC)   Type 2 diabetes mellitus with vascular disease (HCC)   Acute renal failure (HCC)   Acute systolic CHF (congestive heart failure) ([place])    1. Acute on chronic systolic heart failure last EF measured in 2014 was 35-40% - patient has received Lasix 40 mg IV in the ER. I have placed patient on Lasix 60 mg IV every 12 based on response patient probably may need more dose. Holding off ARB and digoxin due to acute renal failure. I have ordered a 2-D echo. Closely follow intake and output daily weights and metabolic panel. Since patient has asymmetric edema of the lower extremity I have ordered Dopplers to rule out DVT. 2. Acute renal failure - urine shows proteinuria and RBCs concerning for glomerulonephritis. I have discussed with Dr. Posey Pronto on-call nephrologist. Dr. Posey Pronto has advised to get a repeat UA and also check C3-C4 ANCA panel, ANA. I also ordered a acute hepatitis panel, HIV urine protein creatinine ratio and renal sonogram. Holding off ARB due to acute renal failure. Please consult nephrologist again in a.m. 3. Hypertension uncontrolled - patient will be continued on hydralazine and Imdur and Coreg. I have placed patient on when necessary IV hydralazine. Closely follow blood pressure trends. 4. Diabetes mellitus type 2 with hyperglycemia - patient states he usually takes Lantus 30 units at bedtime and has not taken it for last 3 weeks. Last hemoglobin A1c in 2017 was 15. I have placed patient on 5 units of Lantus at bedtime with sliding scale coverage. Recheck hemoglobin A1c and closely follow  CBGs. 5. Normocytic normochromic anemia - follow anemia panel. 6. Leukocytosis with possible left lower extremity cellulitis - I have placed patient on doxycycline for now.   DVT prophylaxis: Heparin. Code Status: Full code.  Family Communication: Discussed with patient.  Disposition Plan: Home.  Consults called: Discussed with nephrologist.  Admission status: Inpatient.    Rise Patience MD [facility] Pager [phone].  If 7PM-7AM,  please contact night-coverage [email or web address] Password [facility]  08/17/2016, 2:35 AM

## 2016-08-17 NOTE — Progress Notes (Signed)
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 Positive blisters and blebs on his lower extremities over last 24 hours before admission. Positive weight gain 50 lbs. Non compliant with diuretic therapy or fluid restriction.     Assessment & Plan:   Principal Problem:   Acute on chronic systolic CHF (congestive heart failure) (HCC) Active Problems:   Essential hypertension   CHF (congestive heart failure) (HCC)   Type 2 diabetes mellitus with vascular disease (HCC)   Acute renal failure (HCC)   Acute systolic CHF (congestive heart failure) ([place])   1. Decompensated heart failure. -Will continue diuresis with furosemide, keep negative fluid balance, strict in and out. Will continue b blockade with carvedilol, continue isosorbide, holding ace inh due to risk of worsening renal failure. Follow on echocardiogram. Old records personally reviewed echo from 2014 with moderate dilation on LV with ejection fraction of 35 to 40%. Lower extremity edema with no signs of local infection.   2. AKI on ckd. Base creatinine based on old records from 2014 is 1,0. On admission worsened renal function with cr at 2,49. Will continue diuresis and follow renal function in am, K at 4,1 and serum bicarbonate at 24. Will continue diuresis and follow renal panel in am. No hypertension and patient clinically hypervolemic, noted rbc on UA, but clinical picture suggesting aki due to heart failure. Noted hematuria and proteinuria.   3. HTN. Will continue coreg and diuresis, no ace inh due to risk of worsening renal function. Continue hydralazine bid.   4. T2DM. Will continue glucose cover and monitoring with iss. Capillary glucose 185 and 195. Patient tolerating po well. Continue basal  insulin with lantus 5 units.    5. Anemia. No signs of bleeding.     DVT prophylaxis: enoxaparin  Code Status: full   Family Communication: No family at the bedside  Disposition Plan: home   Consultants:     Procedures:  Antimicrobials:      Subjective: Patient with positive dyspnea, has improved in intensity, persistent lower extremity edema. At home patient has been not compliant with furosemide or fluid restriction.   Objective: Vitals:   08/17/16 0030 08/17/16 0045 08/17/16 0100 08/17/16 0227  BP: 123/68 141/75 143/97 129/79  Pulse: 87 88 87 93  Resp:    18  Temp:    98.5 F (36.9 C)  TempSrc:    Oral  SpO2: 95% 97% 95% 97%  Weight:    (!) 164.6 kg (362 lb 12.8 oz)  Height:    5\' 11"  (1.803 m)    Intake/Output Summary (Last 24 hours) at 08/17/16 1259 Last data filed at 08/17/16 0900  Gross per 24 hour  Intake              730 ml  Output              350 ml  Net              380 ml   Filed Weights   08/16/16 2057 08/17/16 0227  Weight: (!) 165.7 kg (365 lb 6.4 oz) (!) 164.6 kg (362 lb 12.8 oz)    Examination:  General exam:not in pain or dyspnea  E ENT: no pallor or icterus.   Respiratory system: Mild decreased  breath sounds at bases, no wheezing, rale or rhonchi. Respiratory effort normal. Cardiovascular system: S1 & S2 heard, RRR. No JVD, murmurs, rubs, gallops or clicks. +++ pitting edema. Gastrointestinal system: Abdomen is nondistended, soft and nontender. No organomegaly or masses felt. Normal bowel sounds heard. Central nervous system: Alert and oriented. No focal neurological deficits. Extremities: Symmetric 5 x 5 power. Skin: left leg with ulcerated lesion mid medial leg, no pus or erythema, associated with blebs.   Data Reviewed: I have personally reviewed following labs and imaging studies  CBC:  Recent Labs Lab 08/16/16 2055 08/17/16 0241 08/17/16 0753  WBC 15.6* 15.6* 15.3*  NEUTROABS  --   --  12.9*  HGB 12.2* 11.7* 11.5*  HCT  37.1* 35.5* 34.8*  MCV 87.3 87.7 87.9  PLT 338 315 761   Basic Metabolic Panel:  Recent Labs Lab 08/16/16 2055 08/17/16 0241 08/17/16 0753  NA 139  --  138  K 4.1  --  4.0  CL 106  --  107  CO2 24  --  22  GLUCOSE 206*  --  198*  BUN 50*  --  50*  CREATININE 2.49* 2.44* 2.42*  CALCIUM 7.8*  --  7.6*  MG  --  2.1  --    GFR: Estimated Creatinine Clearance: 64.3 mL/min (by C-G formula based on SCr of 2.42 mg/dL (H)). Liver Function Tests:  Recent Labs Lab 08/17/16 0753  AST 32  ALT 35  ALKPHOS 274*  BILITOT 1.5*  PROT 5.9*  ALBUMIN 1.3*   No results for input(s): LIPASE, AMYLASE in the last 168 hours. No results for input(s): AMMONIA in the last 168 hours. Coagulation Profile: No results for input(s): INR, PROTIME in the last 168 hours. Cardiac Enzymes:  Recent Labs Lab 08/17/16 0241 08/17/16 0753  TROPONINI 0.09* 0.17*   BNP (last 3 results) No results for input(s): PROBNP in the last 8760 hours. HbA1C: No results for input(s): HGBA1C in the last 72 hours. CBG:  Recent Labs Lab 08/17/16 0306 08/17/16 0603 08/17/16 0751  GLUCAP 213* 185* 195*   Lipid Profile: No results for input(s): CHOL, HDL, LDLCALC, TRIG, CHOLHDL, LDLDIRECT in the last 72 hours. Thyroid Function Tests:  Recent Labs  08/17/16 0241  TSH 2.641   Anemia Panel:  Recent Labs  08/17/16 0449  VITAMINB12 1,298*  FOLATE 5.6*  FERRITIN 239  TIBC 199*  IRON 10*  RETICCTPCT 0.9   Sepsis Labs: No results for input(s): PROCALCITON, LATICACIDVEN in the last 168 hours.  No results found for this or any previous visit (from the past 240 hour(s)).       Radiology Studies: Dg Chest 2 View  Result Date: 08/16/2016 CLINICAL DATA:  Shortness of breath tonight. EXAM: CHEST  2 VIEW COMPARISON:  02/14/2012 FINDINGS: Moderate-to-marked cardiomegaly which is stable. Central vascular congestion and pulmonary edema. Probable left pleural effusion and minimal fluid in the fissures. No  pneumothorax or confluent airspace disease. IMPRESSION: Findings consistent with CHF. Electronically Signed   By: Jeb Levering M.D.   On: 08/16/2016 22:01   US Renal  Result Date: 08/17/2016 CLINICAL DATA:  Acute onset of renal insufficiency. Initial encounter. EXAM: RENAL / URINARY TRACT ULTRASOUND COMPLETE COMPARISON:  CT of the abdomen and pelvis from 09/02/2009 FINDINGS: Right Kidney: Length: 12.6 cm. Increased parenchymal echogenicity noted. No mass or hydronephrosis visualized. Left Kidney: Length: 11.6 cm. Increased parenchymal echogenicity noted. No mass or hydronephrosis visualized. Difficult to fully assess due to overlying structures. Bladder: Appears normal for degree of  bladder distention. IMPRESSION: 1. No evidence of hydronephrosis. 2. Increased renal parenchymal echogenicity may reflect medical renal disease. Electronically Signed   By: Garald Balding M.D.   On: 08/17/2016 06:39        Scheduled Meds: . aspirin EC  81 mg Oral Daily  . carvedilol  25 mg Oral BID WC  . doxycycline (VIBRAMYCIN) IV  100 mg Intravenous Q12H  . furosemide  60 mg Intravenous Q12H  . heparin  5,000 Units Subcutaneous Q8H  . hydrALAZINE  50 mg Oral BID  . insulin aspart  0-9 Units Subcutaneous TID WC  . insulin glargine  5 Units Subcutaneous QHS  . isosorbide mononitrate  30 mg Oral Daily   Continuous Infusions:   LOS: 0 days     Mauricio Gerome Apley, MD [facility] Pager [phone]  If 7PM-7AM, please contact night-coverage [email or web address] Password [facility] 08/17/2016, 12:59 PM

## 2016-08-17 NOTE — Progress Notes (Signed)
Troponin of 0.09 reported by the lab. MD on call paged at 04:10. No new orders received at this time. Will continue to monitor.

## 2016-08-17 NOTE — ED Notes (Signed)
Admitting MD art bedside.

## 2016-08-17 NOTE — Progress Notes (Signed)
*  PRELIMINARY RESULTS* Vascular Ultrasound Lower extremity venous duplex has been completed.  Preliminary findings: technically limited due to body habitus. Poor visualization bilaterally, but no obvious evidence of DVT.     Landry Mellow, RDMS, RVT  08/17/2016, 11:11 AM

## 2016-08-17 NOTE — Progress Notes (Signed)
Inpatient Diabetes Program Recommendations  AACE/ADA: New Consensus Statement on Inpatient Glycemic Control (2015)  Target Ranges:  Prepandial:   less than 140 mg/dL      Peak postprandial:   less than 180 mg/dL (1-2 hours)      Critically ill patients:  140 - 180 mg/dL   Lab Results  Component Value Date   GLUCAP 195 (H) 08/17/2016   HGBA1C 15.0 12/30/2015    Review of Glycemic ControlResults for JAYSEN, WEY (MRN [id number]) as of 08/17/2016 12:23  Ref. Range 08/17/2016 03:06 08/17/2016 06:03 08/17/2016 07:51  Glucose-Capillary Latest Ref Range: 65 - 99 mg/dL 213 (H) 185 (H) 195 (H)   Diabetes history: Type 2 diabetes Outpatient Diabetes medications: Lantus 30 units q AM  Current orders for Inpatient glycemic control:  Lantus 5 units q PM, Novolog sensitive tid with meals  Inpatient Diabetes Program Recommendations:   Consider increasing Lantus to 15 units q PM.  Thanks, Adah Perl, RN, BC-ADM Inpatient Diabetes Coordinator Pager [phone] (8a-5p)

## 2016-08-18 DIAGNOSIS — N179 Acute kidney failure, unspecified: Secondary | ICD-10-CM

## 2016-08-18 DIAGNOSIS — R600 Localized edema: Secondary | ICD-10-CM

## 2016-08-18 DIAGNOSIS — R609 Edema, unspecified: Secondary | ICD-10-CM

## 2016-08-18 DIAGNOSIS — E1159 Type 2 diabetes mellitus with other circulatory complications: Secondary | ICD-10-CM

## 2016-08-18 DIAGNOSIS — I313 Pericardial effusion (noninflammatory): Secondary | ICD-10-CM

## 2016-08-18 DIAGNOSIS — I5023 Acute on chronic systolic (congestive) heart failure: Secondary | ICD-10-CM

## 2016-08-18 LAB — ANA W/REFLEX IF POSITIVE: ANA: NEGATIVE

## 2016-08-18 LAB — C3 COMPLEMENT: C3 Complement: 148 mg/dL (ref 82–167)

## 2016-08-18 LAB — CBC WITH DIFFERENTIAL/PLATELET
Basophils Absolute: 0 10*3/uL (ref 0.0–0.1)
Basophils Relative: 0 %
Eosinophils Absolute: 0.1 10*3/uL (ref 0.0–0.7)
Eosinophils Relative: 0 %
HEMATOCRIT: 33.3 % — AB (ref 39.0–52.0)
HEMOGLOBIN: 10.9 g/dL — AB (ref 13.0–17.0)
LYMPHS PCT: 10 %
Lymphs Abs: 1.2 10*3/uL (ref 0.7–4.0)
MCH: 28.4 pg (ref 26.0–34.0)
MCHC: 32.7 g/dL (ref 30.0–36.0)
MCV: 86.7 fL (ref 78.0–100.0)
Monocytes Absolute: 1.5 10*3/uL — ABNORMAL HIGH (ref 0.1–1.0)
Monocytes Relative: 12 %
NEUTROS ABS: 9.5 10*3/uL — AB (ref 1.7–7.7)
NEUTROS PCT: 78 %
Platelets: 341 10*3/uL (ref 150–400)
RBC: 3.84 MIL/uL — AB (ref 4.22–5.81)
RDW: 16.5 % — ABNORMAL HIGH (ref 11.5–15.5)
WBC: 12.2 10*3/uL — AB (ref 4.0–10.5)

## 2016-08-18 LAB — BASIC METABOLIC PANEL
ANION GAP: 6 (ref 5–15)
BUN: 50 mg/dL — ABNORMAL HIGH (ref 6–20)
CO2: 24 mmol/L (ref 22–32)
Calcium: 7.5 mg/dL — ABNORMAL LOW (ref 8.9–10.3)
Chloride: 107 mmol/L (ref 101–111)
Creatinine, Ser: 2.37 mg/dL — ABNORMAL HIGH (ref 0.61–1.24)
GFR, EST AFRICAN AMERICAN: 38 mL/min — AB (ref >60)
GFR, EST NON AFRICAN AMERICAN: 33 mL/min — AB (ref >60)
GLUCOSE: 137 mg/dL — AB (ref 65–99)
POTASSIUM: 3.8 mmol/L (ref 3.5–5.1)
SODIUM: 137 mmol/L (ref 135–145)

## 2016-08-18 LAB — ANCA TITERS
Atypical P-ANCA titer: <1:20 {titer}
C-ANCA: <1:20 {titer}
P-ANCA: <1:20 {titer}

## 2016-08-18 LAB — GLUCOSE, CAPILLARY
GLUCOSE-CAPILLARY: 132 mg/dL — AB (ref 65–99)
GLUCOSE-CAPILLARY: 127 mg/dL — AB (ref 65–99)
GLUCOSE-CAPILLARY: 144 mg/dL — AB (ref 65–99)
Glucose-Capillary: 159 mg/dL — ABNORMAL HIGH (ref 65–99)
Glucose-Capillary: 177 mg/dL — ABNORMAL HIGH (ref 65–99)
Glucose-Capillary: 183 mg/dL — ABNORMAL HIGH (ref 65–99)

## 2016-08-18 LAB — MPO/PR-3 (ANCA) ANTIBODIES: Myeloperoxidase Abs: <9 U/mL (ref 0.0–9.0)

## 2016-08-18 LAB — HEMOGLOBIN A1C
HEMOGLOBIN A1C: 11.2 % — AB (ref 4.8–5.6)
Mean Plasma Glucose: 275 mg/dL

## 2016-08-18 LAB — HEPATITIS PANEL, ACUTE
HCV AB: 0.1 {s_co_ratio} (ref 0.0–0.9)
HEP B S AG: NEGATIVE
Hep A IgM: NEGATIVE
Hep B C IgM: NEGATIVE

## 2016-08-18 LAB — C4 COMPLEMENT: COMPLEMENT C4, BODY FLUID: 49 mg/dL — AB (ref 14–44)

## 2016-08-18 MED ORDER — LIVING WELL WITH DIABETES BOOK
Freq: Once | Status: DC
  Filled 2016-08-18: qty 1

## 2016-08-18 MED ORDER — PNEUMOCOCCAL VAC POLYVALENT 25 MCG/0.5ML IJ INJ
0.5000 mL | INJECTION | INTRAMUSCULAR | Status: AC
  Administered 2016-08-19: 0.5 mL via INTRAMUSCULAR

## 2016-08-18 NOTE — Care Management Note (Addendum)
Case Management Note  Patient Details  Name: Joshua Massey MRN: [id number] Date of Birth: [birth year]  Subjective/Objective:                 Patient independent from home, diuresing with lasix. Patient states he srives to appointments, denies difficulties paying for meds. Spoke with PT, state patient would not need HH at this time. CM will continue to follow for DC planning.  Cardiology: Joshua Massey: Nice: Joshua Massey on Joshua Massey   Addendum 3/9 14:40 Follow up scheduled with Joshua [name] MD Joshua Massey for 3/16 at 8:00 pt to arrive 15 minutes early, entered in AVS. Referral made to [facility] RN disease management in 41 yr old with CHF and extensive edema on admission.    Action/Plan:   Expected Discharge Date:                  Expected Discharge Plan:  Home/Self Care  In-House Referral:     Discharge planning Services  CM Consult  Post Acute Care Choice:    Choice offered to:     DME Arranged:    DME Agency:     HH Arranged:    HH Agency:     Status of Service:  In process, will continue to follow  If discussed at Long Length of Stay Meetings, dates discussed:    Additional Comments:  Joshua Collet, RN 08/18/2016, 3:00 PM

## 2016-08-18 NOTE — Evaluation (Signed)
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 Positive blisters and blebs on his lower extremities over last 24 hours before admission. Positive weight gain 50 lbs. Non compliant with diuretic therapy or fluid restriction.    Clinical Impression  Pt admitted with above diagnosis. Pt currently with functional limitations due to the deficits listed below (see PT Problem List).  Pt will benefit from skilled PT to increase their independence and safety with mobility to allow discharge to the venue listed below.       Follow Up Recommendations No PT follow up    Equipment Recommendations  None recommended by PT;Rolling walker with 5" wheels (may need RW depending on progress)    Recommendations for Other Services       Precautions / Restrictions Precautions Precautions: None Restrictions Weight Bearing Restrictions: No      Mobility  Bed Mobility                  Transfers Overall transfer level: Needs assistance Equipment used: Rolling walker (2 wheeled) Transfers: Sit to/from Stand Sit to Stand: Supervision         General transfer comment: uncontrolled descent to sit  Ambulation/Gait Ambulation/Gait assistance: Min guard Ambulation Distance (Feet): 15 Feet Assistive device: None Gait Pattern/deviations: Wide base of support (bil LE externally rotated)        Stairs            Wheelchair Mobility    Modified Rankin (Stroke Patients Only)       Balance Overall balance assessment: Needs assistance         Standing balance support: No upper extremity supported;During functional activity Standing balance-Leahy Scale: Good                               Pertinent  Vitals/Pain Pain Assessment: 0-10 Pain Score: 6  Pain Location: LLE Pain Descriptors / Indicators: Aching Pain Intervention(s): Monitored during session;Repositioned;Limited activity within patient's tolerance    Home Living Family/patient expects to be discharged to:: Private residence Living Arrangements: Spouse/significant other;Children (girlfriend, 58 and 62 y/o children) Available Help at Discharge: Family Type of Home: Apartment Home Access: Stairs to enter Entrance Stairs-Rails: Psychiatric nurse of Steps: 2 Home Layout: One level Home Equipment: None      Prior Function Level of Independence: Independent               Hand Dominance        Extremity/Trunk Assessment   Upper Extremity Assessment Upper Extremity Assessment: Defer to OT evaluation    Lower Extremity Assessment Lower Extremity Assessment: Generalized weakness       Communication   Communication: No difficulties  Cognition Arousal/Alertness: Awake/alert Behavior During Therapy: WFL for tasks assessed/performed Overall Cognitive Status: Within Functional Limits for tasks assessed                      General Comments      Exercises     Assessment/Plan    PT Assessment Patient needs continued PT services  PT Problem List Decreased strength;Decreased activity tolerance;Decreased mobility;Decreased knowledge of use of DME;Pain       PT Treatment Interventions DME instruction;Gait training;Stair training;Functional mobility training;Therapeutic  activities;Therapeutic exercise;Balance training;Patient/family education    PT Goals (Current goals can be found in the Care Plan section)  Acute Rehab PT Goals Patient Stated Goal: to go home PT Goal Formulation: With patient Time For Goal Achievement: 08/25/16 Potential to Achieve Goals: Good    Frequency Min 3X/week   Barriers to discharge   anticipate pt will progress well with PT once fluid and pain decreased     Co-evaluation               End of Session   Activity Tolerance: Patient tolerated treatment well;Patient limited by pain Patient left: in chair;with call bell/phone within reach Nurse Communication: Mobility status PT Visit Diagnosis: Unsteadiness on feet (R26.81);Muscle weakness (generalized) (M62.81);Difficulty in walking, not elsewhere classified (R26.2);Pain Pain - Right/Left: Left Pain - part of body: Leg         Time: 1657-9038 PT Time Calculation (min) (ACUTE ONLY): 16 min   Charges:   PT Evaluation $PT Eval Low Complexity: 1 Procedure PT Treatments $Gait Training: 8-22 mins   PT G Codes:             Laureen Abrahams, PT, DPT 08/18/16 2:34 PM   [phone]

## 2016-08-18 NOTE — Progress Notes (Addendum)
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 Positive blisters and blebs on his lower extremities over last 24 hours before admission. Positive weight gain 50 lbs. Non compliant with diuretic therapy or fluid restriction.     Assessment & Plan:   Principal Problem:   Acute on chronic systolic CHF (congestive heart failure) (HCC) Active Problems:   Essential hypertension   CHF (congestive heart failure) (HCC)   Type 2 diabetes mellitus with vascular disease (HCC)   Acute renal failure (HCC)   Acute systolic CHF (congestive heart failure) ([place])   1. Decompensated heart failure.  Diuresis with furosemide, urine output 500. 7 kg weight loss  over last 24 hours.  Continue b blockade with carvedilol, continue isosorbide-hydralazine, holding ace inh due to risk of worsening renal failure. Echocardiogram with reduction (worsening) on ejection fraction on the left ventricle to 25 to 30%. With moderate pericardial effusion. Positive non sustained ventricular tachycardia (4 beats), mono-focal personally reviewed. Patient non compliant with medications as outpatient. Patient may need life-vest and reinforcement medication compliance. Cardiology has been consulted.  2. New pericardial effusion. Blood pressure stable, will continue diuresis regimen, will consult cardiology. Echocardiogram suggesting possible tamponade physiology.   2. AKI. Base creatinine based on old records from 2014 is 1,0. Continue diuresis, cr stable at 2,3 from 2,4. K at 3,8 and serum bicarbonate at 24. Urine output 500 but noted weight reduction 7 kg. Question accuracy of urine output meassurment.   3. HTN. Continue coreg, after load reduction with hydralazine and isosorbide. Continue  diuresis, no ace inh due to risk of worsening renal function.   4. T2DM. Continue glucose cover and monitoring with iss. Capillary glucose [id number]. Patient tolerating po well. Continue basal insulin with lantus 5 units.    5. Anemia of iron deficiency. No signs of bleeding. Hb stable at 10.9. Serum iron down to 10 with ferritin 239 and transferrin saturation down to 5 cw iron deficiency, will start patient on iron supplements.    DVT prophylaxis: enoxaparin  Code Status: full   Family Communication: No family at the bedside  Disposition Plan: home   Consultants:     Procedures:  Antimicrobials   Subjective: Patient with positive dyspnea and decrease exertion capacity. No nausea or vomiting, no chest pain or palpitations, had 4 beat of VT, with no symptoms.   Objective: Vitals:   08/17/16 2024 08/18/16 0500 08/18/16 0621 08/18/16 0823  BP: (!) 141/80  (!) 139/100 (!) 147/81  Pulse: 81  75 82  Resp: 18   18  Temp: 99.2 F (37.3 C)  98.3 F (36.8 C) 97.8 F (36.6 C)  TempSrc: Oral  Oral Oral  SpO2: 100%  97% 100%  Weight:  (!) 157.3 kg (346 lb 11.2 oz)    Height:        Intake/Output Summary (Last 24 hours) at 08/18/16 1414 Last data filed at 08/18/16 1157  Gross per 24 hour  Intake              480 ml  Output             1260 ml  Net             -780 ml   Filed Weights   08/16/16 2057 08/17/16  9371 08/18/16 0500  Weight: (!) 165.7 kg (365 lb 6.4 oz) (!) 164.6 kg (362 lb 12.8 oz) (!) 157.3 kg (346 lb 11.2 oz)    Examination:  General exam: deconditioned,  E ENT: mild pallor, no icterus Respiratory system: Mild decreased breath sounds at bases with bibasilar rales.  Cardiovascular system: S1 & S2 heard, RRR. No JVD, murmurs, rubs, gallops or clicks. +++ pitting edema. Gastrointestinal system: Abdomen is nondistended, soft and nontender. No organomegaly or masses felt. Normal bowel sounds heard. Central nervous system: Alert and oriented. No focal  neurological deficits. Extremities: Symmetric 5 x 5 power. Skin: Ulcerated lesion on the left. Dressing in place.   Data Reviewed: I have personally reviewed following labs and imaging studies  CBC:  Recent Labs Lab 08/16/16 2055 08/17/16 0241 08/17/16 0753 08/18/16 0313  WBC 15.6* 15.6* 15.3* 12.2*  NEUTROABS  --   --  12.9* 9.5*  HGB 12.2* 11.7* 11.5* 10.9*  HCT 37.1* 35.5* 34.8* 33.3*  MCV 87.3 87.7 87.9 86.7  PLT 338 315 291 696   Basic Metabolic Panel:  Recent Labs Lab 08/16/16 2055 08/17/16 0241 08/17/16 0753 08/18/16 0313  NA 139  --  138 137  K 4.1  --  4.0 3.8  CL 106  --  107 107  CO2 24  --  22 24  GLUCOSE 206*  --  198* 137*  BUN 50*  --  50* 50*  CREATININE 2.49* 2.44* 2.42* 2.37*  CALCIUM 7.8*  --  7.6* 7.5*  MG  --  2.1  --   --    GFR: Estimated Creatinine Clearance: 64 mL/min (by C-G formula based on SCr of 2.37 mg/dL (H)). Liver Function Tests:  Recent Labs Lab 08/17/16 0753  AST 32  ALT 35  ALKPHOS 274*  BILITOT 1.5*  PROT 5.9*  ALBUMIN 1.3*   No results for input(s): LIPASE, AMYLASE in the last 168 hours. No results for input(s): AMMONIA in the last 168 hours. Coagulation Profile: No results for input(s): INR, PROTIME in the last 168 hours. Cardiac Enzymes:  Recent Labs Lab 08/17/16 0241 08/17/16 0753 08/17/16 1405  TROPONINI 0.09* 0.17* 0.10*   BNP (last 3 results) No results for input(s): PROBNP in the last 8760 hours. HbA1C:  Recent Labs  08/17/16 0241  HGBA1C 11.2*   CBG:  Recent Labs Lab 08/17/16 1948 08/18/16 0030 08/18/16 0408 08/18/16 0633 08/18/16 1104  GLUCAP 239* 159* 132* 127* 144*   Lipid Profile: No results for input(s): CHOL, HDL, LDLCALC, TRIG, CHOLHDL, LDLDIRECT in the last 72 hours. Thyroid Function Tests:  Recent Labs  08/17/16 0241  TSH 2.641   Anemia Panel:  Recent Labs  08/17/16 0449  VITAMINB12 1,298*  FOLATE 5.6*  FERRITIN 239  TIBC 199*  IRON 10*  RETICCTPCT 0.9    Sepsis Labs: No results for input(s): PROCALCITON, LATICACIDVEN in the last 168 hours.  No results found for this or any previous visit (from the past 240 hour(s)).       Radiology Studies: Dg Chest 2 View  Result Date: 08/16/2016 CLINICAL DATA:  Shortness of breath tonight. EXAM: CHEST  2 VIEW COMPARISON:  02/14/2012 FINDINGS: Moderate-to-marked cardiomegaly which is stable. Central vascular congestion and pulmonary edema. Probable left pleural effusion and minimal fluid in the fissures. No pneumothorax or confluent airspace disease. IMPRESSION: Findings consistent with CHF. Electronically Signed   By: Jeb Levering M.D.   On: 08/16/2016 22:01   US Renal  Result Date: 08/17/2016 CLINICAL DATA:  Acute onset  of renal insufficiency. Initial encounter. EXAM: RENAL / URINARY TRACT ULTRASOUND COMPLETE COMPARISON:  CT of the abdomen and pelvis from 09/02/2009 FINDINGS: Right Kidney: Length: 12.6 cm. Increased parenchymal echogenicity noted. No mass or hydronephrosis visualized. Left Kidney: Length: 11.6 cm. Increased parenchymal echogenicity noted. No mass or hydronephrosis visualized. Difficult to fully assess due to overlying structures. Bladder: Appears normal for degree of bladder distention. IMPRESSION: 1. No evidence of hydronephrosis. 2. Increased renal parenchymal echogenicity may reflect medical renal disease. Electronically Signed   By: Garald Balding M.D.   On: 08/17/2016 06:39        Scheduled Meds: . aspirin EC  81 mg Oral Daily  . carvedilol  25 mg Oral BID WC  . furosemide  60 mg Intravenous Q12H  . heparin  5,000 Units Subcutaneous Q8H  . hydrALAZINE  50 mg Oral BID  . insulin aspart  0-9 Units Subcutaneous TID WC  . insulin glargine  5 Units Subcutaneous QHS  . isosorbide mononitrate  30 mg Oral Daily  . pantoprazole  40 mg Oral Daily  . [START ON 08/19/2016] pneumococcal 23 valent vaccine  0.5 mL Intramuscular Tomorrow-1000   Continuous Infusions:   LOS: 1 day      Chaniya Genter Gerome Apley, MD [facility] Pager [phone]  If 7PM-7AM, please contact night-coverage [email or web address] Password [email or web address] 08/18/2016, 2:14 PM

## 2016-08-18 NOTE — Consult Note (Signed)
[place] Nurse wound consult note Reason for Consult:BLE weeping, R great toe wound Wound type: Tip of R great toe unstageable, R leg with multiple blisters, some intact, some already drained, Left leg with partial thickness wounds and large area of excoriated skin Pressure Injury POA: R great toe Yes Measurement:Left leg 24cm x 20 cm x 0.1cm area with macerated skin with gelatinous drainage underneath, one area skin has peeled back 5cm x 4cm x 0.1cm unable to replace skin back due to skin tissue that is peeled back is dead. 100% red granulating tissue on the one open area on left pretibial area. The entire leg is weeping, soaked through two bed pads, yellow stained. Left foot looks like Charcot deformity, pt states it has been broken and healed like that.. Right leg has 3 intact blisters 0.5cm x 0.5cm and 3 blisters that have already opened and drained, also 0.5cm x 0.5cm x 0.1cm with white wound beds, no drainage or odor from these sites. Right great toe has unstageable black scabbed area on tip of toe. Wound bed: see above Drainage (amount, consistency, odor) see above Periwound:see above Dressing procedure/placement/frequency: .I have provided nurses with orders for betadine to wound on Right great toe, leave open to air. Also for LLE  Cleanse with NS, gently pat dry, apply Xeroform gauze, ABDs, wrap with kerlex and ace wrap, change every shift.  [place] team will continue to follow to assess for any changes in treatment that are needed.    Fara Olden, RN-C, WTA-C Wound Treatment Associate

## 2016-08-18 NOTE — Consult Note (Signed)
Cardiology Consult    Patient ID: Joshua Massey MRN: [id number], DOB/AGE: 40-Dec-1977   Admit date: 08/16/2016 Date of Consult: 08/18/2016  Primary Physician: Pcp Not In System Primary Cardiologist: Gwenlyn Found Requesting Provider: Arrien Reason for Consultation: CHF  Patient Profile    41 yo male with PMH of NICM, IDDM, Obesity, GERD and HTN who presented with dyspnea and LE edema.   Past Medical History   Past Medical History:  Diagnosis Date  . 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 Diabetes mellitus    adult-onset, type 2  . Exogenous obesity   . GERD (gastroesophageal reflux disease)   . Hypertension     Past Surgical History:  Procedure Laterality Date  . ANKLE SURGERY  2003   plate and screws  . CARDIAC CATHETERIZATION  02/16/2012   50% LAD lesion in mid vessel, otherwise no significant obstructive CAD  . HIP PINNING  1990   bilateral  . LEFT AND RIGHT HEART CATHETERIZATION WITH CORONARY ANGIOGRAM N/A 02/16/2012   Procedure: LEFT AND RIGHT HEART CATHETERIZATION WITH CORONARY ANGIOGRAM;  Surgeon: Pixie Casino, MD;  Location: [facility] CATH LAB;  Service: Cardiovascular;  Laterality: N/A;  . TRANSTHORACIC ECHOCARDIOGRAM  08/31/2012   EF 35-40%, no significant wall motion abnormalities, diastolic relaxation abnormality - no longer needed LifeVest; LV cavity size mod dilated with mod conc hypertrophy, systolic function mod reduced; mild MR, LA mod dilated     Allergies  No Known Allergies  History of Present Illness    Joshua Massey is a 41 yo male with PMH of NICM, IDDM, Obesity, GERD and HTN. He was previously a patient of Dr. Rollene Fare and seen by Dr. Haroldine Laws in the HF clinic. Presented in 2013 with EF of 20%. Underwent a R/LHC that revealed normal coronaries, and elevated filling pressures. Discharged with lifevest. Had improvement with medical therapy to 40%. Has been noncompliant with follow up visits and medications since that time. He was  last seen in the office 10/17 by Dr. Gwenlyn Found and reported running out of his diuretics and other medications for weeks at a time. His medications were refilled and 40mg  daily lasix was increased to BID. Was planned for follow up in the HF clinic the next week.   Last echo 3/14 showed EF of 35-40% with G1DD, no WMA and mild MR.   He presented with reports of increasing LE edema, and reported a 60lb weight gain. He is in inconsistent with his story when asked about his compliance with home medications. Reports he recently lost his job, but now has a new one with insurance and has been able to get his medications. Though when asked about his lasix he states he tends to "run out" and not refill them. Denies any chest pain, orthopnea, PND, lightheadedness or dizziness. On admission his labs showed stable electrolytes, BNP 2062, Cr 2.49, trop 0.09>>0.17>>0.10, Hgb 11.7, Hgb A1c 11.2, TSH 2.6. EKG showed ST with 1st degree AVB. He was admitted for IV diuresis to [facility].   Inpatient Medications    . aspirin EC  81 mg Oral Daily  . carvedilol  25 mg Oral BID WC  . furosemide  60 mg Intravenous Q12H  . heparin  5,000 Units Subcutaneous Q8H  . hydrALAZINE  50 mg Oral BID  . insulin aspart  0-9 Units Subcutaneous TID WC  . insulin glargine  5 Units Subcutaneous QHS  . isosorbide mononitrate  30 mg Oral Daily  . pantoprazole  40 mg  Oral Daily  . [START ON 08/19/2016] pneumococcal 23 valent vaccine  0.5 mL Intramuscular Tomorrow-1000    Family History    Family History  Problem Relation Age of Onset  . Coronary artery disease Brother     CABG at 39, DM  . Diabetes Mother   . Hypertension Mother   . Diabetes Father     Social History    Social History   Social History  . Marital status: Single    Spouse name: N/A  . Number of children: 6  . Years of education: 61   Occupational History  . 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 new[employer] delivery [employer]   Social History Main  Topics  . Smoking status: Never Smoker  . Smokeless tobacco: Never Used  . Alcohol use No  . Drug use: No  . Sexual activity: Yes    Birth control/ protection: Condom   Other Topics Concern  . Not on file   Social History Narrative  . No narrative on file     Review of Systems    General:  No chills, fever, night sweats, ++ weight changes.  Cardiovascular:  No chest pain, dyspnea on exertion, ++ edema, orthopnea, palpitations, paroxysmal nocturnal dyspnea. Dermatological: No rash, lesions/masses Respiratory: No cough, dyspnea Urologic: No hematuria, dysuria Abdominal:   No nausea, vomiting, diarrhea, bright red blood per rectum, melena, or hematemesis Neurologic:  No visual changes, wkns, changes in mental status. All other systems reviewed and are otherwise negative except as noted above.  Physical Exam    Blood pressure (!) 147/81, pulse 82, temperature 97.8 F (36.6 C), temperature source Oral, resp. rate 18, height 5\' 11"  (1.803 m), weight (!) 346 lb 11.2 oz (157.3 kg), SpO2 100 %.  General: Young, disheveled morbidly obese AAM, NAD Psych: Normal affect. Neuro: Alert and oriented X 3. Moves all extremities spontaneously. HEENT: Normal  Neck: Supple without bruits, unable to assess JVD due to girth. Lungs:  Resp regular and unlabored, CTA. Heart: RRR no s3, s4, or murmurs. Abdomen: Soft, obese, non-tender, non-distended, BS + x 4.  Extremities: No clubbing, cyanosis , 3+ bilateral LE edema. Legs wrapped bilaterally.   Labs    Troponin [facility] Test)  Recent Labs  08/16/16 2113  TROPIPOC 0.00    Recent Labs  08/17/16 0241 08/17/16 0753 08/17/16 1405  TROPONINI 0.09* 0.17* 0.10*   Lab Results  Component Value Date   WBC 12.2 (H) 08/18/2016   HGB 10.9 (L) 08/18/2016   HCT 33.3 (L) 08/18/2016   MCV 86.7 08/18/2016   PLT 341 08/18/2016    Recent Labs Lab 08/17/16 0753 08/18/16 0313  NA 138 137  K 4.0 3.8  CL 107 107  CO2 22 24  BUN 50* 50*    CREATININE 2.42* 2.37*  CALCIUM 7.6* 7.5*  PROT 5.9*  --   BILITOT 1.5*  --   ALKPHOS 274*  --   ALT 35  --   AST 32  --   GLUCOSE 198* 137*   Lab Results  Component Value Date   CHOL 129 01/01/2013   HDL 28 (L) 01/01/2013   LDLCALC 60 01/01/2013   TRIG 204 (H) 01/01/2013   Lab Results  Component Value Date   DDIMER  11/10/2008    0.41        AT THE INHOUSE ESTABLISHED CUTOFF VALUE OF 0.48 ug/mL FEU, THIS ASSAY HAS BEEN DOCUMENTED IN THE LITERATURE TO HAVE  A SENSITIVITY AND NEGATIVE PREDICTIVE VALUE OF AT LEAST 98 TO 99%.  THE TEST RESULT SHOULD BE CORRELATED WITH AN ASSESSMENT OF THE CLINICAL PROBABILITY OF DVT / VTE.     Radiology Studies    Dg Chest 2 View  Result Date: 08/16/2016 CLINICAL DATA:  Shortness of breath tonight. EXAM: CHEST  2 VIEW COMPARISON:  02/14/2012 FINDINGS: Moderate-to-marked cardiomegaly which is stable. Central vascular congestion and pulmonary edema. Probable left pleural effusion and minimal fluid in the fissures. No pneumothorax or confluent airspace disease. IMPRESSION: Findings consistent with CHF. Electronically Signed   By: Jeb Levering M.D.   On: 08/16/2016 22:01   US Renal  Result Date: 08/17/2016 CLINICAL DATA:  Acute onset of renal insufficiency. Initial encounter. EXAM: RENAL / URINARY TRACT ULTRASOUND COMPLETE COMPARISON:  CT of the abdomen and pelvis from 09/02/2009 FINDINGS: Right Kidney: Length: 12.6 cm. Increased parenchymal echogenicity noted. No mass or hydronephrosis visualized. Left Kidney: Length: 11.6 cm. Increased parenchymal echogenicity noted. No mass or hydronephrosis visualized. Difficult to fully assess due to overlying structures. Bladder: Appears normal for degree of bladder distention. IMPRESSION: 1. No evidence of hydronephrosis. 2. Increased renal parenchymal echogenicity may reflect medical renal disease. Electronically Signed   By: Garald Balding M.D.   On: 08/17/2016 06:39    ECG & Cardiac Imaging     EKG: ST  Echo: 08/17/16  Study Conclusions  - Procedure narrative: Transthoracic echocardiography. Image   quality was adequate. Intravenous contrast (Definity) was   administered. - Left ventricle: The cavity size was normal. Wall thickness was   increased in a pattern of mild LVH. Systolic function was   severely reduced. The estimated ejection fraction was in the   range of 25% to 30%. No apical thrombus with Defininty contrast.   Diffuse hypokinesis. The study is not technically sufficient to   allow evaluation of LV diastolic function. - Ventricular septum: Septal motion showed paradox. The contour   showed diastolic flattening and systolic flattening. - Mitral valve: Mildly thickened leaflets . There was trivial   regurgitation. - Left atrium: Moderately dilated. - Right ventricle: Poorly visualized. The cavity size was   moderately dilated. - Right atrium: Moderately dilated. - Pulmonic valve: There was mild regurgitation. - Pulmonary arteries: PA peak pressure: 56 mm Hg (S). - Inferior vena cava: The vessel was dilated. The respirophasic   diameter changes were blunted (< 50%), consistent with elevated   central venous pressure. - Pericardium, extracardiac: Cannot exclude tamponade physiology -   clinical correlation is advised.  Impressions:  - Compared to a prior study in 2014, the LVEF is lower at 25-30%.   There is now a moderate size pericardial effusion - tamponade   physiology cannot be excluded, especially in the setting of   moderate pulmonary hypertension with signs of volume and pressure   overload of the RV.  Assessment & Plan    41 yo male with PMH of NICM, IDDM, Obesity, GERD and HTN who presented with dyspnea and LE edema.  1. Acute on Chronic systolic HF: Presented with increasing LE edema and weight gain at home. States he again ran out of his home medications, including lasix and has been taking them whenever he has them. BNP 2062 on admission  and CXR with edema. He is grossly volume overloaded on exam.  -- attempting to diuresis with IV lasix, though weights do not appear accurate, and I&Os show only 32cc UOP yesterday, and 760cc today. Reports scrotal swelling and having a hard  time urinating. Consider bladder scan and foley placement for more accurate I&o.  -- continue BB, ASA, hydralazine, imdur -- echo this admission with reduced EF to 25-30%, from previously reported 40%, with concern for moderate size pericardial effusion. Not tachycardic, no distress noted.  -- would continue with IV diuresis, Cr stable with diuresis thus far. Will ask for HF consult.  -- discussed the issue of noncompliance with him  2. CKD III: Cr stable with diuresis thus far. Home ARB and spironolactone have been held on admission.   3. HTN: Not well controlled, would consider further increase hydralazine. Continue BB and Imdur -- no ARB/ACEi in the setting of CKD  4. IDDM: CBGs uncontrolled. Primary following  5. Morbid Obesity: Does not adhere to diet restrictions, eats lots of fast food and sodas. He needs aggressive lifestyle modifications.   Barnet Pall, NP-C Pager [phone] 08/18/2016, 3:09 PM    I have examined the patient and reviewed assessment and plan and discussed with patient.  Agree with above as stated.  I personally reviewed the echo.  There is a small pericardial effusion.  THe RV is dilated and hypocontractile.  THere is no tamponade physiology. Continue aggressive diuresis to help with edema.  Mostly right sided heart failure.  I stressed the importance of longterm compliance with medication, which has been an issue for him in the past.   Larae Grooms

## 2016-08-18 NOTE — Progress Notes (Signed)
Inpatient Diabetes Program Recommendations  AACE/ADA: New Consensus Statement on Inpatient Glycemic Control (2015)  Target Ranges:  Prepandial:   less than 140 mg/dL      Peak postprandial:   less than 180 mg/dL (1-2 hours)      Critically ill patients:  140 - 180 mg/dL   Lab Results  Component Value Date   GLUCAP 144 (H) 08/18/2016   HGBA1C 11.2 (H) 08/17/2016    Review of Glycemic Control  Inpatient Diabetes Program Recommendations:   Spoke with pt about A1C 11.2 and explained what an A1C is, basic pathophysiology of DM Type 2, basic home care, basic diabetes diet nutrition principles, importance of checking CBGs and maintaining good CBG control to prevent long-term and short-term complications. Reviewed signs and symptoms of hyperglycemia and hypoglycemia and how to treat hypoglycemia at home. Also reviewed blood sugar goals at home.  RNs to provide ongoing basic DM education at bedside with this patient. Have ordered educational booklet and DM videos.Patient states understanding and shared that he takes medications as prescribed without difficulty.  Thank you, [name]. Dailey Alberson, RN, MSN, CDE Inpatient Glycemic Control Team Team Pager [phone] (8am-5pm) 08/18/2016 3:20 PM

## 2016-08-19 DIAGNOSIS — N183 Chronic kidney disease, stage 3 (moderate): Secondary | ICD-10-CM

## 2016-08-19 DIAGNOSIS — N179 Acute kidney failure, unspecified: Secondary | ICD-10-CM

## 2016-08-19 LAB — BASIC METABOLIC PANEL
ANION GAP: 9 (ref 5–15)
BUN: 50 mg/dL — AB (ref 6–20)
CO2: 24 mmol/L (ref 22–32)
Calcium: 7.7 mg/dL — ABNORMAL LOW (ref 8.9–10.3)
Chloride: 104 mmol/L (ref 101–111)
Creatinine, Ser: 2.4 mg/dL — ABNORMAL HIGH (ref 0.61–1.24)
GFR, EST AFRICAN AMERICAN: 37 mL/min — AB (ref >60)
GFR, EST NON AFRICAN AMERICAN: 32 mL/min — AB (ref >60)
Glucose, Bld: 165 mg/dL — ABNORMAL HIGH (ref 65–99)
POTASSIUM: 3.9 mmol/L (ref 3.5–5.1)
SODIUM: 137 mmol/L (ref 135–145)

## 2016-08-19 LAB — GLUCOSE, CAPILLARY
GLUCOSE-CAPILLARY: 186 mg/dL — AB (ref 65–99)
GLUCOSE-CAPILLARY: 173 mg/dL — AB (ref 65–99)
GLUCOSE-CAPILLARY: 155 mg/dL — AB (ref 65–99)
Glucose-Capillary: 179 mg/dL — ABNORMAL HIGH (ref 65–99)

## 2016-08-19 NOTE — Progress Notes (Signed)
PROGRESS NOTE    Joshua Massey  OEU:[id number] DOB: May 23, 1976 DOA: 08/16/2016 PCP: Pcp Not In System    Brief Narrative:  41 yo male with nonischemic cardiomyopthy, EF 35 to 40%, presents with worsening dyspnea and lower extremity edema, persistent and worsening over last month. Positive blisters and blebs on his lower extremities over last 24 hours before admission. Positive weight gain 50 lbs. Non compliant with diuretic therapy or fluid restriction.   Assessment & Plan:   Principal Problem:   Acute on chronic systolic CHF (congestive heart failure) (HCC) Active Problems:   Essential hypertension   CHF (congestive heart failure) (HCC)   Type 2 diabetes mellitus with vascular disease (HCC)   Acute renal failure (HCC)   Acute systolic CHF (congestive heart failure) (HCC)   Peripheral edema   1. Decompensated heart failure. Continue aggressive diuresis with furosemide 60 mg IV q12H, urine output 1610 ml over last 24 hours.  Continue arvedilol,  isosorbide-hydralazine, no ace inh due to risk of worsening renal failure. Echocardiogram with worsening LV function and pericardial effusion, will follow on cardiology recommendations.   2. New pericardial effusion. No clinical signs of tamponade, blood pressure stable, will continue diuresis regimen.  2. AKI. Renal function with cr at 2,4 from 2,3, will continue diuresis with furosemide and follow on renal panel in am, K at 3,9 and serum bicarbonate at 24.  3. HTN. Blood pressure control with  coreg, and after load reduction with hydralazine and isosorbide.   4. T2DM. Glucose cover and monitoring with iss. Capillary glucose 186, 179,173. Patient tolerating po well. Basal insulin with lantus 5 units.   5. Anemia of iron deficiency. Continue iron supplements.   DVT prophylaxis:enoxaparin  Code Status: full  Family Communication:No family at the bedside  Disposition Plan:home    Consultants:    Procedures:  Antimicrobials   Subjective: Patient with persistent edema on the lower extremities having difficulty ambulating, no chest pain, no nausea or vomiting. Positive dyspnea on exertion.   Objective: Vitals:   08/18/16 1730 08/18/16 2050 08/19/16 0605 08/19/16 1251  BP: 138/77 140/89 (!) 141/88 (!) 139/92  Pulse: 83 81 81 80  Resp: 18 18 18 18   Temp: 98.4 F (36.9 C) 98.1 F (36.7 C) 98.4 F (36.9 C) 98.4 F (36.9 C)  TempSrc: Oral Oral Oral Oral  SpO2: 98% 95% 100% 99%  Weight:   (!) 162.9 kg (359 lb 2.1 oz)   Height:        Intake/Output Summary (Last 24 hours) at 08/19/16 1329 Last data filed at 08/19/16 0845  Gross per 24 hour  Intake              960 ml  Output              850 ml  Net              110 ml   Filed Weights   08/17/16 0227 08/18/16 0500 08/19/16 0605  Weight: (!) 164.6 kg (362 lb 12.8 oz) (!) 157.3 kg (346 lb 11.2 oz) (!) 162.9 kg (359 lb 2.1 oz)    Examination:  General exam: deconditioned E ENT: mild pallor, no icterus, oral mucosa moist.   Respiratory system: Mild decreased breath sounds at bases with no wheezing, rales or rhonchi. Cardiovascular system: S1 & S2 heard, RRR. No JVD, murmurs, rubs, gallops or clicks. Positive pitting +++ pitting edema. Gastrointestinal system: Abdomen is nondistended, soft and nontender. No organomegaly or masses felt. Normal bowel sounds  heard. Central nervous system: Alert and oriented. No focal neurological deficits. Extremities: Symmetric 5 x 5 power. Skin: Ulcerated lesion on the left leg with dressing in place.     Data Reviewed: I have personally reviewed following labs and imaging studies  CBC:  Recent Labs Lab 08/16/16 2055 08/17/16 0241 08/17/16 0753 08/18/16 0313  WBC 15.6* 15.6* 15.3* 12.2*  NEUTROABS  --   --  12.9* 9.5*  HGB 12.2* 11.7* 11.5* 10.9*  HCT 37.1* 35.5* 34.8* 33.3*  MCV 87.3 87.7 87.9 86.7  PLT 338 315 291 644   Basic Metabolic  Panel:  Recent Labs Lab 08/16/16 2055 08/17/16 0241 08/17/16 0753 08/18/16 0313 08/19/16 0323  NA 139  --  138 137 137  K 4.1  --  4.0 3.8 3.9  CL 106  --  107 107 104  CO2 24  --  22 24 24   GLUCOSE 206*  --  198* 137* 165*  BUN 50*  --  50* 50* 50*  CREATININE 2.49* 2.44* 2.42* 2.37* 2.40*  CALCIUM 7.8*  --  7.6* 7.5* 7.7*  MG  --  2.1  --   --   --    GFR: Estimated Creatinine Clearance: 64.5 mL/min (by C-G formula based on SCr of 2.4 mg/dL (H)). Liver Function Tests:  Recent Labs Lab 08/17/16 0753  AST 32  ALT 35  ALKPHOS 274*  BILITOT 1.5*  PROT 5.9*  ALBUMIN 1.3*   No results for input(s): LIPASE, AMYLASE in the last 168 hours. No results for input(s): AMMONIA in the last 168 hours. Coagulation Profile: No results for input(s): INR, PROTIME in the last 168 hours. Cardiac Enzymes:  Recent Labs Lab 08/17/16 0241 08/17/16 0753 08/17/16 1405  TROPONINI 0.09* 0.17* 0.10*   BNP (last 3 results) No results for input(s): PROBNP in the last 8760 hours. HbA1C:  Recent Labs  08/17/16 0241  HGBA1C 11.2*   CBG:  Recent Labs Lab 08/18/16 1104 08/18/16 1621 08/18/16 2142 08/19/16 0605 08/19/16 1122  GLUCAP 144* 183* 177* 186* 179*   Lipid Profile: No results for input(s): CHOL, HDL, LDLCALC, TRIG, CHOLHDL, LDLDIRECT in the last 72 hours. Thyroid Function Tests:  Recent Labs  08/17/16 0241  TSH 2.641   Anemia Panel:  Recent Labs  08/17/16 0449  VITAMINB12 1,298*  FOLATE 5.6*  FERRITIN 239  TIBC 199*  IRON 10*  RETICCTPCT 0.9   Sepsis Labs: No results for input(s): PROCALCITON, LATICACIDVEN in the last 168 hours.  No results found for this or any previous visit (from the past 240 hour(s)).       Radiology Studies: No results found.      Scheduled Meds: . aspirin EC  81 mg Oral Daily  . carvedilol  25 mg Oral BID WC  . furosemide  60 mg Intravenous Q12H  . heparin  5,000 Units Subcutaneous Q8H  . hydrALAZINE  50 mg Oral  BID  . insulin aspart  0-9 Units Subcutaneous TID WC  . insulin glargine  5 Units Subcutaneous QHS  . isosorbide mononitrate  30 mg Oral Daily  . living well with diabetes book   Does not apply Once  . pantoprazole  40 mg Oral Daily   Continuous Infusions:   LOS: 2 days      Quinnie Barcelo Gerome Apley, MD [facility] Pager [phone]  If 7PM-7AM, please contact night-coverage [email or web address] Password [email or web address] 08/19/2016, 1:29 PM

## 2016-08-19 NOTE — Progress Notes (Signed)
Progress Note  Patient Name: Joshua Massey Date of Encounter: 08/19/2016  Primary Cardiologist: Dr. Gwenlyn Found  Subjective   Feels better.  Still with LE edema  Inpatient Medications    Scheduled Meds: . aspirin EC  81 mg Oral Daily  . carvedilol  25 mg Oral BID WC  . furosemide  60 mg Intravenous Q12H  . heparin  5,000 Units Subcutaneous Q8H  . hydrALAZINE  50 mg Oral BID  . insulin aspart  0-9 Units Subcutaneous TID WC  . insulin glargine  5 Units Subcutaneous QHS  . isosorbide mononitrate  30 mg Oral Daily  . living well with diabetes book   Does not apply Once  . pantoprazole  40 mg Oral Daily  . pneumococcal 23 valent vaccine  0.5 mL Intramuscular Tomorrow-1000   Continuous Infusions:  PRN Meds: acetaminophen **OR** acetaminophen, hydrALAZINE, ondansetron **OR** ondansetron (ZOFRAN) IV   Vital Signs    Vitals:   08/18/16 0823 08/18/16 1730 08/18/16 2050 08/19/16 0605  BP: (!) 147/81 138/77 140/89 (!) 141/88  Pulse: 82 83 81 81  Resp: 18 18 18 18   Temp: 97.8 F (36.6 C) 98.4 F (36.9 C) 98.1 F (36.7 C) 98.4 F (36.9 C)  TempSrc: Oral Oral Oral Oral  SpO2: 100% 98% 95% 100%  Weight:    (!) 359 lb 2.1 oz (162.9 kg)  Height:        Intake/Output Summary (Last 24 hours) at 08/19/16 0840 Last data filed at 08/19/16 0400  Gross per 24 hour  Intake              960 ml  Output              850 ml  Net              110 ml   Filed Weights   08/17/16 0227 08/18/16 0500 08/19/16 0605  Weight: (!) 362 lb 12.8 oz (164.6 kg) (!) 346 lb 11.2 oz (157.3 kg) (!) 359 lb 2.1 oz (162.9 kg)    Telemetry     - Personally Reviewed  ECG    Sinus tach on 2/27 - Personally Reviewed  Physical Exam   GEN: No acute distress.   Neck: No JVD Cardiac: RRR, no murmurs, rubs, or gallops.  Respiratory: Clear to auscultation bilaterally. GI: Soft, nontender, non-distended  MS: No edema; No deformity.; bilateral LE edema Neuro:  Nonfocal  Psych: Normal affect   Labs      Chemistry Recent Labs Lab 08/17/16 0753 08/18/16 0313 08/19/16 0323  NA 138 137 137  K 4.0 3.8 3.9  CL 107 107 104  CO2 22 24 24   GLUCOSE 198* 137* 165*  BUN 50* 50* 50*  CREATININE 2.42* 2.37* 2.40*  CALCIUM 7.6* 7.5* 7.7*  PROT 5.9*  --   --   ALBUMIN 1.3*  --   --   AST 32  --   --   ALT 35  --   --   ALKPHOS 274*  --   --   BILITOT 1.5*  --   --   GFRNONAA 32* 33* 32*  GFRAA 37* 38* 37*  ANIONGAP 9 6 9      Hematology Recent Labs Lab 08/17/16 0241 08/17/16 0449 08/17/16 0753 08/18/16 0313  WBC 15.6*  --  15.3* 12.2*  RBC 4.05* 3.92* 3.96* 3.84*  HGB 11.7*  --  11.5* 10.9*  HCT 35.5*  --  34.8* 33.3*  MCV 87.7  --  87.9 86.7  MCH 28.9  --  29.0 28.4  MCHC 33.0  --  33.0 32.7  RDW 16.8*  --  16.8* 16.5*  PLT 315  --  291 341    Cardiac Enzymes Recent Labs Lab 08/17/16 0241 08/17/16 0753 08/17/16 1405  TROPONINI 0.09* 0.17* 0.10*    Recent Labs Lab 08/16/16 2113  TROPIPOC 0.00     BNP Recent Labs Lab 08/16/16 2055  BNP 2,062.6*     DDimer No results for input(s): DDIMER in the last 168 hours.   Radiology    No results found.  Cardiac Studies   LVEF 25-30%  Patient Profile     41 y.o. male with acute on chronic systolic heart failure  Assessment & Plan    Acute on chronic systolic heart failure: Continue diuresis.  Most of sx are from right sided failure with LE edema and skin breakdown.  COntinue wound care.  CRI: Cr stable.  Watch closely during aggressive diuresis.    Signed, Larae Grooms, MD  08/19/2016, 8:40 AM

## 2016-08-20 LAB — BASIC METABOLIC PANEL
Anion gap: 10 (ref 5–15)
BUN: 49 mg/dL — ABNORMAL HIGH (ref 6–20)
CHLORIDE: 104 mmol/L (ref 101–111)
CO2: 23 mmol/L (ref 22–32)
Calcium: 7.7 mg/dL — ABNORMAL LOW (ref 8.9–10.3)
Creatinine, Ser: 2.19 mg/dL — ABNORMAL HIGH (ref 0.61–1.24)
GFR calc Af Amer: 42 mL/min — ABNORMAL LOW (ref >60)
GFR calc non Af Amer: 36 mL/min — ABNORMAL LOW (ref >60)
Glucose, Bld: 174 mg/dL — ABNORMAL HIGH (ref 65–99)
Potassium: 4 mmol/L (ref 3.5–5.1)
SODIUM: 137 mmol/L (ref 135–145)

## 2016-08-20 LAB — GLUCOSE, CAPILLARY
GLUCOSE-CAPILLARY: 161 mg/dL — AB (ref 65–99)
Glucose-Capillary: 155 mg/dL — ABNORMAL HIGH (ref 65–99)
Glucose-Capillary: 170 mg/dL — ABNORMAL HIGH (ref 65–99)
Glucose-Capillary: 161 mg/dL — ABNORMAL HIGH (ref 65–99)

## 2016-08-20 MED ORDER — MORPHINE SULFATE (PF) 2 MG/ML IV SOLN
1.0000 mg | Freq: Once | INTRAVENOUS | Status: AC
  Administered 2016-08-21: 1 mg via INTRAVENOUS
  Filled 2016-08-20: qty 1

## 2016-08-20 MED ORDER — HYDROCODONE-ACETAMINOPHEN 5-325 MG PO TABS: 1.0000 | ORAL_TABLET | Freq: Once | ORAL | Status: DC

## 2016-08-20 MED ORDER — HYDROCODONE-ACETAMINOPHEN 5-325 MG PO TABS
1.0000 | ORAL_TABLET | Freq: Once | ORAL | Status: AC
  Administered 2016-08-20: 1 via ORAL
  Filled 2016-08-20: qty 1

## 2016-08-20 MED ORDER — FUROSEMIDE 10 MG/ML IJ SOLN
120.0000 mg | Freq: Two times a day (BID) | INTRAVENOUS | Status: DC
  Administered 2016-08-20 – 2016-08-24 (×8): 120 mg via INTRAVENOUS
  Filled 2016-08-20 (×9): qty 12

## 2016-08-20 NOTE — Progress Notes (Signed)
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 Positive blisters and blebs on his lower extremities over last 24 hours before admission. Positive weight gain 50 lbs. Non compliant with diuretic therapy or fluid restriction.    Assessment & Plan:   Principal Problem:   Acute on chronic systolic CHF (congestive heart failure) (HCC) Active Problems:   Essential hypertension   CHF (congestive heart failure) (HCC)   Type 2 diabetes mellitus with vascular disease (HCC)   Acute renal failure (HCC)   Acute systolic CHF (congestive heart failure) ([place])   Peripheral edema   AKI (acute kidney injury) ([place])  1. Decompensated heart failure. Continue aggressive diuresis with furosemide drip, urine output 1125 ml over last 24 hours. Continue carvedilol and isosorbide-hydralazine, no ace inh due to AKI.  2. New pericardial effusion. No clinical signs of tamponade, blood pressure stable, continue diuresis, may need follow up echocardiogram as outpatient.   2. AKI. Patient placed on furosemide drip, will continue to follow on renal function and electrolytes. Avoid hypotension.   3. HTN. Continue coreg, hydralazine and isosorbide. Blood pressure 267 to 124 systolic.   4. T2DM. Glucose cover and monitoring with insulin sliding scale. Capillary glucose 155, 170, 162.  Basal insulin with 5 units.  5. Anemia of iron deficiency. Continue iron supplements.   6, Right lower extremity ulcerated wound. Continue pain control and local wound care.   DVT prophylaxis:enoxaparin  Code Status: full  Family Communication:No family at the bedside  Disposition Plan:home   Consultants:    Procedures:  Antimicrobials   Subjective: Patient with  persistent edema at the lower extremities, difficulty ambulating, no chest pain and dyspnea has improved.   Objective: Vitals:   08/19/16 1251 08/19/16 2124 08/20/16 0519 08/20/16 0902  BP: (!) 139/92 (!) 129/95 (!) 137/91 130/76  Pulse: 80 78 75 79  Resp: 18 12 12    Temp: 98.4 F (36.9 C) 97.8 F (36.6 C) 98.1 F (36.7 C)   TempSrc: Oral Oral Oral   SpO2: 99% 100% 100%   Weight:   (!) 164.8 kg (363 lb 5.1 oz)   Height:        Intake/Output Summary (Last 24 hours) at 08/20/16 1338 Last data filed at 08/20/16 1216  Gross per 24 hour  Intake              240 ml  Output             1575 ml  Net            -1335 ml   Filed Weights   08/18/16 0500 08/19/16 0605 08/20/16 0519  Weight: (!) 157.3 kg (346 lb 11.2 oz) (!) 162.9 kg (359 lb 2.1 oz) (!) 164.8 kg (363 lb 5.1 oz)    Examination:  General exam: not in pain or dyspnea E ENT: no pallor or icterus, oral mucosa moist.  Respiratory system: Clear to auscultation. Respiratory effort normal. Mild decreased breath sounds at bases.  Cardiovascular system: S1 & S2 heard, RRR. No JVD, murmurs, rubs, gallops or clicks. +++ pitting edema, lower extremities. Gastrointestinal system: Abdomen is protuberant, nondistended, soft and nontender. No organomegaly or masses felt. Normal bowel sounds heard. Central nervous system: Alert and oriented. No focal neurological deficits. Extremities: Symmetric 5 x  5 power. Skin: ulcerated wound with dressing in place on the right lower extremity.    Data Reviewed: I have personally reviewed following labs and imaging studies  CBC:  Recent Labs Lab 08/16/16 2055 08/17/16 0241 08/17/16 0753 08/18/16 0313  WBC 15.6* 15.6* 15.3* 12.2*  NEUTROABS  --   --  12.9* 9.5*  HGB 12.2* 11.7* 11.5* 10.9*  HCT 37.1* 35.5* 34.8* 33.3*  MCV 87.3 87.7 87.9 86.7  PLT 338 315 291 161   Basic Metabolic Panel:  Recent Labs Lab 08/16/16 2055 08/17/16 0241 08/17/16 0753 08/18/16 0313 08/19/16 0323  NA  139  --  138 137 137  K 4.1  --  4.0 3.8 3.9  CL 106  --  107 107 104  CO2 24  --  22 24 24   GLUCOSE 206*  --  198* 137* 165*  BUN 50*  --  50* 50* 50*  CREATININE 2.49* 2.44* 2.42* 2.37* 2.40*  CALCIUM 7.8*  --  7.6* 7.5* 7.7*  MG  --  2.1  --   --   --    GFR: Estimated Creatinine Clearance: 64.9 mL/min (by C-G formula based on SCr of 2.4 mg/dL (H)). Liver Function Tests:  Recent Labs Lab 08/17/16 0753  AST 32  ALT 35  ALKPHOS 274*  BILITOT 1.5*  PROT 5.9*  ALBUMIN 1.3*   No results for input(s): LIPASE, AMYLASE in the last 168 hours. No results for input(s): AMMONIA in the last 168 hours. Coagulation Profile: No results for input(s): INR, PROTIME in the last 168 hours. Cardiac Enzymes:  Recent Labs Lab 08/17/16 0241 08/17/16 0753 08/17/16 1405  TROPONINI 0.09* 0.17* 0.10*   BNP (last 3 results) No results for input(s): PROBNP in the last 8760 hours. HbA1C: No results for input(s): HGBA1C in the last 72 hours. CBG:  Recent Labs Lab 08/19/16 1122 08/19/16 1609 08/19/16 2119 08/20/16 0548 08/20/16 1118  GLUCAP 179* 173* 155* 155* 170*   Lipid Profile: No results for input(s): CHOL, HDL, LDLCALC, TRIG, CHOLHDL, LDLDIRECT in the last 72 hours. Thyroid Function Tests: No results for input(s): TSH, T4TOTAL, FREET4, T3FREE, THYROIDAB in the last 72 hours. Anemia Panel: No results for input(s): VITAMINB12, FOLATE, FERRITIN, TIBC, IRON, RETICCTPCT in the last 72 hours. Sepsis Labs: No results for input(s): PROCALCITON, LATICACIDVEN in the last 168 hours.  No results found for this or any previous visit (from the past 240 hour(s)).       Radiology Studies: No results found.      Scheduled Meds: . aspirin EC  81 mg Oral Daily  . carvedilol  25 mg Oral BID WC  . furosemide  120 mg Intravenous Q12H  . heparin  5,000 Units Subcutaneous Q8H  . hydrALAZINE  50 mg Oral BID  . HYDROcodone-acetaminophen  1 tablet Oral Once  . insulin aspart  0-9 Units  Subcutaneous TID WC  . insulin glargine  5 Units Subcutaneous QHS  . isosorbide mononitrate  30 mg Oral Daily  . living well with diabetes book   Does not apply Once  . pantoprazole  40 mg Oral Daily   Continuous Infusions:   LOS: 3 days    Mauricio Gerome Apley, MD [facility] Pager [phone]  If 7PM-7AM, please contact night-coverage [email or web address] Password [email or web address] 08/20/2016, 1:38 PM

## 2016-08-20 NOTE — Progress Notes (Signed)
Progress Note  Patient Name: Joshua Massey Date of Encounter: 08/20/2016  Primary Cardiologist: Dr. Gwenlyn Found  Subjective   Feels better.  Still with LE edema  Inpatient Medications    Scheduled Meds: . aspirin EC  81 mg Oral Daily  . carvedilol  25 mg Oral BID WC  . furosemide  60 mg Intravenous Q12H  . heparin  5,000 Units Subcutaneous Q8H  . hydrALAZINE  50 mg Oral BID  . insulin aspart  0-9 Units Subcutaneous TID WC  . insulin glargine  5 Units Subcutaneous QHS  . isosorbide mononitrate  30 mg Oral Daily  . living well with diabetes book   Does not apply Once  . pantoprazole  40 mg Oral Daily   Continuous Infusions:  PRN Meds: acetaminophen **OR** acetaminophen, hydrALAZINE, ondansetron **OR** ondansetron (ZOFRAN) IV   Vital Signs    Vitals:   08/19/16 1251 08/19/16 2124 08/20/16 0519 08/20/16 0902  BP: (!) 139/92 (!) 129/95 (!) 137/91 130/76  Pulse: 80 78 75 79  Resp: 18 12 12    Temp: 98.4 F (36.9 C) 97.8 F (36.6 C) 98.1 F (36.7 C)   TempSrc: Oral Oral Oral   SpO2: 99% 100% 100%   Weight:   (!) 363 lb 5.1 oz (164.8 kg)   Height:        Intake/Output Summary (Last 24 hours) at 08/20/16 0905 Last data filed at 08/20/16 0700  Gross per 24 hour  Intake                0 ml  Output             1125 ml  Net            -1125 ml   Filed Weights   08/18/16 0500 08/19/16 0605 08/20/16 0519  Weight: (!) 346 lb 11.2 oz (157.3 kg) (!) 359 lb 2.1 oz (162.9 kg) (!) 363 lb 5.1 oz (164.8 kg)    Telemetry     - Personally Reviewed  ECG    Sinus tach on 2/27 - Personally Reviewed  Physical Exam   GEN: No acute distress.   Neck: No JVD Cardiac: RRR, no murmurs, rubs, or gallops.  Respiratory: Clear to auscultation bilaterally. GI: Soft, nontender, non-distended  MS: No edema; No deformity.; bilateral LE edema Neuro:  Nonfocal  Psych: Normal affect   Labs    Chemistry  Recent Labs Lab 08/17/16 0753 08/18/16 0313 08/19/16 0323  NA 138 137 137    K 4.0 3.8 3.9  CL 107 107 104  CO2 22 24 24   GLUCOSE 198* 137* 165*  BUN 50* 50* 50*  CREATININE 2.42* 2.37* 2.40*  CALCIUM 7.6* 7.5* 7.7*  PROT 5.9*  --   --   ALBUMIN 1.3*  --   --   AST 32  --   --   ALT 35  --   --   ALKPHOS 274*  --   --   BILITOT 1.5*  --   --   GFRNONAA 32* 33* 32*  GFRAA 37* 38* 37*  ANIONGAP 9 6 9      Hematology  Recent Labs Lab 08/17/16 0241 08/17/16 0449 08/17/16 0753 08/18/16 0313  WBC 15.6*  --  15.3* 12.2*  RBC 4.05* 3.92* 3.96* 3.84*  HGB 11.7*  --  11.5* 10.9*  HCT 35.5*  --  34.8* 33.3*  MCV 87.7  --  87.9 86.7  MCH 28.9  --  29.0 28.4  MCHC 33.0  --  33.0 32.7  RDW 16.8*  --  16.8* 16.5*  PLT 315  --  291 341    Cardiac Enzymes  Recent Labs Lab 08/17/16 0241 08/17/16 0753 08/17/16 1405  TROPONINI 0.09* 0.17* 0.10*     Recent Labs Lab 08/16/16 2113  TROPIPOC 0.00     BNP  Recent Labs Lab 08/16/16 2055  BNP 2,062.6*     DDimer No results for input(s): DDIMER in the last 168 hours.   Radiology    No results found.  Cardiac Studies   LVEF 25-30%  Patient Profile     41 y.o. male with acute on chronic systolic heart failure  Assessment & Plan    Acute on chronic systolic heart failure: Continue diuresis.  Most of sx are from right sided failure with LE edema and skin breakdown.  Continue wound care. Colston Pyle increase lasix dose as his UOP has not been as much as needed. Stressed need for medical compliance, Lorenza Winkleman need to continue discussions.  CRI: Cr stable.  Watch closely during aggressive diuresis.    Signed, Phyillis Dascoli Meredith Leeds, MD  08/20/2016, 9:05 AM

## 2016-08-21 LAB — BASIC METABOLIC PANEL
ANION GAP: 7 (ref 5–15)
BUN: 50 mg/dL — AB (ref 6–20)
CALCIUM: 7.6 mg/dL — AB (ref 8.9–10.3)
CO2: 24 mmol/L (ref 22–32)
CREATININE: 2.19 mg/dL — AB (ref 0.61–1.24)
Chloride: 106 mmol/L (ref 101–111)
GFR calc Af Amer: 42 mL/min — ABNORMAL LOW (ref >60)
GFR, EST NON AFRICAN AMERICAN: 36 mL/min — AB (ref >60)
GLUCOSE: 185 mg/dL — AB (ref 65–99)
Potassium: 3.9 mmol/L (ref 3.5–5.1)
Sodium: 137 mmol/L (ref 135–145)

## 2016-08-21 LAB — GLUCOSE, CAPILLARY
Glucose-Capillary: 181 mg/dL — ABNORMAL HIGH (ref 65–99)
Glucose-Capillary: 172 mg/dL — ABNORMAL HIGH (ref 65–99)
Glucose-Capillary: 171 mg/dL — ABNORMAL HIGH (ref 65–99)
Glucose-Capillary: 208 mg/dL — ABNORMAL HIGH (ref 65–99)

## 2016-08-21 MED ORDER — HYDROCODONE-ACETAMINOPHEN 10-325 MG PO TABS
1.0000 | ORAL_TABLET | ORAL | Status: DC | PRN
  Administered 2016-08-21 – 2016-09-02 (×60): 1 via ORAL
  Filled 2016-08-21 (×61): qty 1

## 2016-08-21 MED ORDER — HYDROCODONE-ACETAMINOPHEN 10-325 MG PO TABS
1.0000 | ORAL_TABLET | Freq: Once | ORAL | Status: AC
  Administered 2016-08-21: 1 via ORAL
  Filled 2016-08-21: qty 1

## 2016-08-21 NOTE — Progress Notes (Signed)
Progress Note  Patient Name: Joshua Massey Date of Encounter: 08/21/2016  Primary Cardiologist: Dr. Gwenlyn Found  Subjective   Feels better.  Still with LE edema  Inpatient Medications    Scheduled Meds: . aspirin EC  81 mg Oral Daily  . carvedilol  25 mg Oral BID WC  . furosemide  120 mg Intravenous Q12H  . heparin  5,000 Units Subcutaneous Q8H  . hydrALAZINE  50 mg Oral BID  . insulin aspart  0-9 Units Subcutaneous TID WC  . insulin glargine  5 Units Subcutaneous QHS  . isosorbide mononitrate  30 mg Oral Daily  . living well with diabetes book   Does not apply Once  . pantoprazole  40 mg Oral Daily   Continuous Infusions:  PRN Meds: acetaminophen **OR** acetaminophen, hydrALAZINE, ondansetron **OR** ondansetron (ZOFRAN) IV   Vital Signs    Vitals:   08/20/16 2123 08/21/16 0521 08/21/16 0547 08/21/16 0806  BP: 140/89  133/83 118/70  Pulse: 79  78 78  Resp: 18  18   Temp: 97.9 F (36.6 C)  98.7 F (37.1 C)   TempSrc: Oral  Oral   SpO2: 96%  97%   Weight:  (!) 361 lb 1.8 oz (163.8 kg)    Height:        Intake/Output Summary (Last 24 hours) at 08/21/16 0951 Last data filed at 08/21/16 0865  Gross per 24 hour  Intake              240 ml  Output             1650 ml  Net            -1410 ml   Filed Weights   08/19/16 0605 08/20/16 0519 08/21/16 0521  Weight: (!) 359 lb 2.1 oz (162.9 kg) (!) 363 lb 5.1 oz (164.8 kg) (!) 361 lb 1.8 oz (163.8 kg)    Telemetry     - Personally Reviewed  ECG    Sinus tach on 2/27 - Personally Reviewed  Physical Exam   GEN: No acute distress.   Neck: No JVD Cardiac: RRR, no murmurs, rubs, or gallops.  Respiratory: Clear to auscultation bilaterally. GI: Soft, nontender, non-distended  MS: No edema; No deformity.; bilateral LE edema Neuro:  Nonfocal  Psych: Normal affect   Labs    Chemistry  Recent Labs Lab 08/17/16 0753  08/19/16 0323 08/20/16 1752 08/21/16 0310  NA 138  < > 137 137 137  K 4.0  < > 3.9 4.0 3.9   CL 107  < > 104 104 106  CO2 22  < > 24 23 24   GLUCOSE 198*  < > 165* 174* 185*  BUN 50*  < > 50* 49* 50*  CREATININE 2.42*  < > 2.40* 2.19* 2.19*  CALCIUM 7.6*  < > 7.7* 7.7* 7.6*  PROT 5.9*  --   --   --   --   ALBUMIN 1.3*  --   --   --   --   AST 32  --   --   --   --   ALT 35  --   --   --   --   ALKPHOS 274*  --   --   --   --   BILITOT 1.5*  --   --   --   --   GFRNONAA 32*  < > 32* 36* 36*  GFRAA 37*  < > 37* 42* 42*  ANIONGAP 9  < >  9 10 7   < > = values in this interval not displayed.   Hematology  Recent Labs Lab 08/17/16 0241 08/17/16 0449 08/17/16 0753 08/18/16 0313  WBC 15.6*  --  15.3* 12.2*  RBC 4.05* 3.92* 3.96* 3.84*  HGB 11.7*  --  11.5* 10.9*  HCT 35.5*  --  34.8* 33.3*  MCV 87.7  --  87.9 86.7  MCH 28.9  --  29.0 28.4  MCHC 33.0  --  33.0 32.7  RDW 16.8*  --  16.8* 16.5*  PLT 315  --  291 341    Cardiac Enzymes  Recent Labs Lab 08/17/16 0241 08/17/16 0753 08/17/16 1405  TROPONINI 0.09* 0.17* 0.10*     Recent Labs Lab 08/16/16 2113  TROPIPOC 0.00     BNP  Recent Labs Lab 08/16/16 2055  BNP 2,062.6*     DDimer No results for input(s): DDIMER in the last 168 hours.   Radiology    No results found.  Cardiac Studies   LVEF 25-30%  Patient Profile     41 y.o. male with acute on chronic systolic heart failure  Assessment & Plan    Acute on chronic systolic heart failure: Continue diuresis. 2.8L net out.  Most of sx are from right sided failure with LE edema and skin breakdown.  Continue wound care. Has urinated well with increased dose of lasix with improvement in kidney function. Will continue at 120 mg BID. If kidney function continues to improve, may benefit from metolazone if diuresis decreases.  CRI: Cr stable.  Improving with aggressive diuresis.    Hypertension: BP well controlled Signed, Will Meredith Leeds, MD  08/21/2016, 9:51 AM

## 2016-08-21 NOTE — Progress Notes (Signed)
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 Positive blisters and blebs on his lower extremities over last 24 hours before admission. Positive weight gain 50 lbs. Non compliant with diuretic therapy or fluid restriction. Patient placed on high doses of furosemide for diuresis.    Assessment & Plan:   Principal Problem:   Acute on chronic systolic CHF (congestive heart failure) (HCC) Active Problems:   Essential hypertension   CHF (congestive heart failure) (HCC)   Type 2 diabetes mellitus with vascular disease (HCC)   Acute renal failure (HCC)   Acute systolic CHF (congestive heart failure) ([place])   Peripheral edema   AKI (acute kidney injury) ([place])   1. Decompensated heart failure. Patient continue aggressive diuresis with furosemide 120 mg bid , urine output 1350 ml over last 24 hours. On carvedilol andisosorbide-hydralazine, noace inh due to AKI.  2. New pericardial effusion. No clinical signs of tamponade, blood pressure stable 782 to 423 systolic, continue with diuresis.  2. AKI. Renal function with cr at 2.19, with K at 3,9 and serum bicarbonate at 24, urine output 1350 ml over last 24 hours, will continue to follow on renal panel in am.   3. HTN. On coreg, hydralazine and isosorbide. Aggressive diuresis.   4. T2DM. Will continue with gucose cover and monitoring with insulin sliding scale. Capillary glucose 161-181-172  Basal insulin with 5 units and tolerating well.   5. Anemia of iron deficiency. Continue iron supplements.    6, Right lower extremity ulcerated wound. Continue local wound care, worsening pain refractive to tylenol, will add hydrocodone. Avoid nonsteroidal anti-inflammatory agents.   DVT prophylaxis:heparin    Code Status: full  Family Communication:No family at the bedside  Disposition Plan:home   Consultants:Cardiology   Procedures:  Antimicrobials   Subjective: Patient with no chest pain, dyspnea and edema improving, no nausea or vomiting. Positive pain on the right lower extremity.   Objective: Vitals:   08/20/16 2123 08/21/16 0521 08/21/16 0547 08/21/16 0806  BP: 140/89  133/83 118/70  Pulse: 79  78 78  Resp: 18  18   Temp: 97.9 F (36.6 C)  98.7 F (37.1 C)   TempSrc: Oral  Oral   SpO2: 96%  97%   Weight:  (!) 163.8 kg (361 lb 1.8 oz)    Height:        Intake/Output Summary (Last 24 hours) at 08/21/16 1130 Last data filed at 08/21/16 5361  Gross per 24 hour  Intake              240 ml  Output             1200 ml  Net             -960 ml   Filed Weights   08/19/16 0605 08/20/16 0519 08/21/16 0521  Weight: (!) 162.9 kg (359 lb 2.1 oz) (!) 164.8 kg (363 lb 5.1 oz) (!) 163.8 kg (361 lb 1.8 oz)    Examination:  General exam: deconditioned E ENT: no pallor or icterus Respiratory system: Clear to auscultation. Respiratory effort normal. Decreased breath sounds at bases, no wheezing or rhonchi.  Cardiovascular system: S1 & S2 heard, RRR. No JVD, murmurs, rubs, gallops or clicks.  ++++ pitting edema. Gastrointestinal system: Abdomen is nondistended,  soft and nontender. No organomegaly or masses felt. Normal bowel sounds heard. Central nervous system: Alert and oriented. No focal neurological deficits. Extremities: Symmetric 5 x 5 power. Positive scrotal edema.  Skin: right leg ulcerated wound with dressing in place.   Data Reviewed: I have personally reviewed following labs and imaging studies  CBC:  Recent Labs Lab 08/16/16 2055 08/17/16 0241 08/17/16 0753 08/18/16 0313  WBC 15.6* 15.6* 15.3* 12.2*  NEUTROABS  --   --  12.9* 9.5*  HGB 12.2* 11.7* 11.5* 10.9*  HCT 37.1* 35.5* 34.8* 33.3*  MCV 87.3 87.7 87.9 86.7  PLT 338 315 291 563   Basic  Metabolic Panel:  Recent Labs Lab 08/17/16 0241 08/17/16 0753 08/18/16 0313 08/19/16 0323 08/20/16 1752 08/21/16 0310  NA  --  138 137 137 137 137  K  --  4.0 3.8 3.9 4.0 3.9  CL  --  107 107 104 104 106  CO2  --  22 24 24 23 24   GLUCOSE  --  198* 137* 165* 174* 185*  BUN  --  50* 50* 50* 49* 50*  CREATININE 2.44* 2.42* 2.37* 2.40* 2.19* 2.19*  CALCIUM  --  7.6* 7.5* 7.7* 7.7* 7.6*  MG 2.1  --   --   --   --   --    GFR: Estimated Creatinine Clearance: 70.9 mL/min (by C-G formula based on SCr of 2.19 mg/dL (H)). Liver Function Tests:  Recent Labs Lab 08/17/16 0753  AST 32  ALT 35  ALKPHOS 274*  BILITOT 1.5*  PROT 5.9*  ALBUMIN 1.3*   No results for input(s): LIPASE, AMYLASE in the last 168 hours. No results for input(s): AMMONIA in the last 168 hours. Coagulation Profile: No results for input(s): INR, PROTIME in the last 168 hours. Cardiac Enzymes:  Recent Labs Lab 08/17/16 0241 08/17/16 0753 08/17/16 1405  TROPONINI 0.09* 0.17* 0.10*   BNP (last 3 results) No results for input(s): PROBNP in the last 8760 hours. HbA1C: No results for input(s): HGBA1C in the last 72 hours. CBG:  Recent Labs Lab 08/20/16 1118 08/20/16 1641 08/20/16 2054 08/21/16 0615 08/21/16 1113  GLUCAP 170* 161* 161* 181* 172*   Lipid Profile: No results for input(s): CHOL, HDL, LDLCALC, TRIG, CHOLHDL, LDLDIRECT in the last 72 hours. Thyroid Function Tests: No results for input(s): TSH, T4TOTAL, FREET4, T3FREE, THYROIDAB in the last 72 hours. Anemia Panel: No results for input(s): VITAMINB12, FOLATE, FERRITIN, TIBC, IRON, RETICCTPCT in the last 72 hours. Sepsis Labs: No results for input(s): PROCALCITON, LATICACIDVEN in the last 168 hours.  No results found for this or any previous visit (from the past 240 hour(s)).       Radiology Studies: No results found.      Scheduled Meds: . aspirin EC  81 mg Oral Daily  . carvedilol  25 mg Oral BID WC  . furosemide  120 mg  Intravenous Q12H  . heparin  5,000 Units Subcutaneous Q8H  . hydrALAZINE  50 mg Oral BID  . HYDROcodone-acetaminophen  1 tablet Oral Once  . insulin aspart  0-9 Units Subcutaneous TID WC  . insulin glargine  5 Units Subcutaneous QHS  . isosorbide mononitrate  30 mg Oral Daily  . living well with diabetes book   Does not apply Once  . pantoprazole  40 mg Oral Daily   Continuous Infusions:   LOS: 4 days     Justo Hengel Gerome Apley, MD [facility] Pager [phone]  If 7PM-7AM, please contact night-coverage [email or web address] Password  [email or web address] 08/21/2016, 11:30 AM

## 2016-08-22 LAB — BASIC METABOLIC PANEL
Anion gap: 5 (ref 5–15)
BUN: 47 mg/dL — AB (ref 6–20)
CHLORIDE: 105 mmol/L (ref 101–111)
CO2: 26 mmol/L (ref 22–32)
CREATININE: 2.26 mg/dL — AB (ref 0.61–1.24)
Calcium: 7.6 mg/dL — ABNORMAL LOW (ref 8.9–10.3)
GFR calc Af Amer: 40 mL/min — ABNORMAL LOW (ref >60)
GFR calc non Af Amer: 35 mL/min — ABNORMAL LOW (ref >60)
GLUCOSE: 168 mg/dL — AB (ref 65–99)
POTASSIUM: 3.9 mmol/L (ref 3.5–5.1)
Sodium: 136 mmol/L (ref 135–145)

## 2016-08-22 LAB — GLUCOSE, CAPILLARY
GLUCOSE-CAPILLARY: 183 mg/dL — AB (ref 65–99)
Glucose-Capillary: 202 mg/dL — ABNORMAL HIGH (ref 65–99)
Glucose-Capillary: 162 mg/dL — ABNORMAL HIGH (ref 65–99)
Glucose-Capillary: 171 mg/dL — ABNORMAL HIGH (ref 65–99)

## 2016-08-22 NOTE — Progress Notes (Signed)
PROGRESS NOTE    MIZRAIM HARMENING  FHL:[id number] DOB: [birth year] DOA: 08/16/2016 PCP: Pcp Not In System    Brief Narrative:  41 yo male with nonischemic cardiomyopthy, EF 35 to 40%, presents with worsening dyspnea and lower extremity edema, persistent and worsening over last month. Positive blisters and blebs on his lower extremities over last 24 hours before admission. Positive weight gain 50 lbs. Non compliant with diuretic therapy or fluid restriction. Patient placed on high doses of furosemide for diuresis.    Assessment & Plan:   Principal Problem:   Acute on chronic systolic CHF (congestive heart failure) (HCC) Active Problems:   Essential hypertension   CHF (congestive heart failure) (HCC)   Type 2 diabetes mellitus with vascular disease (HCC)   Acute renal failure (HCC)   Acute systolic CHF (congestive heart failure) ([place])   Peripheral edema   AKI (acute kidney injury) ([place])   1. Decompensated heart failure. Continue aggressive diuresis with furosemide 120 mg bid ,urine output 1814ml over last 24 hours. Continue carvedilol andisosorbide-hydralazine, noace inh due to AKI.  2. New pericardial effusion. Tolerating diuresis well, no signs of tamponade.  2. AKI. Renal function with cr at 2.29, from 2,19 with K at 3,9 and serum bicarbonate at 26. Developing metabolic alkalosis. Will follow on rena function in am.   3. HTN. Continue coreg 25 mg bid, hydralazine and isosorbide.   4. T2DM. Glucose cover and monitoring with insulin sliding scale. Capillary glucose [phone].Basal insulin with 5 units and tolerating well.   5. Anemia of iron deficiency. Continue iron supplements with no complications.    6, Right lower extremity ulcerated wound. Local wound care, improved pain control with hydrocodone.  DVT prophylaxis:heparin  Code Status: full  Family Communication:No family at the bedside  Disposition Plan:home   Consultants:Cardiology    Procedures:  Antimicrobials  Subjective: Patient feeling better, edema with slow improving, able to ambulate with rolling walker and physical therapy.   Objective: Vitals:   08/21/16 2111 08/22/16 0547 08/22/16 0937 08/22/16 1047  BP: 132/80 (!) 139/94 129/77   Pulse: 77 78  83  Resp: 18 20    Temp: 97.8 F (36.6 C) 97.4 F (36.3 C)    TempSrc: Oral Oral    SpO2: 93% 94%    Weight:  (!) 163.1 kg (359 lb 9.1 oz)    Height:        Intake/Output Summary (Last 24 hours) at 08/22/16 1157 Last data filed at 08/22/16 0820  Gross per 24 hour  Intake              480 ml  Output             2175 ml  Net            -1695 ml   Filed Weights   08/20/16 0519 08/21/16 0521 08/22/16 0547  Weight: (!) 164.8 kg (363 lb 5.1 oz) (!) 163.8 kg (361 lb 1.8 oz) (!) 163.1 kg (359 lb 9.1 oz)    Examination:  General exam: not in pain or dyspnea E ENT: no pallor or icterus, oral mucosa moist.   Respiratory system: decreased breath sounds at bases, no wheezing. Respiratory effort normal. Cardiovascular system: S1 & S2 heard, RRR. No JVD, murmurs, rubs, gallops or clicks. +++ pitting edema. Gastrointestinal system: Abdomen is nondistended, soft and nontender. No organomegaly or masses felt. Normal bowel sounds heard. Central nervous system: Alert and oriented. No focal neurological deficits. Extremities: Symmetric 5 x 5 power. Skin:  No rashes, lesions or ulcers     Data Reviewed: I have personally reviewed following labs and imaging studies  CBC:  Recent Labs Lab 08/16/16 2055 08/17/16 0241 08/17/16 0753 08/18/16 0313  WBC 15.6* 15.6* 15.3* 12.2*  NEUTROABS  --   --  12.9* 9.5*  HGB 12.2* 11.7* 11.5* 10.9*  HCT 37.1* 35.5* 34.8* 33.3*  MCV 87.3 87.7 87.9 86.7  PLT 338 315 291 734   Basic Metabolic Panel:  Recent Labs Lab 08/17/16 0241  08/18/16 0313 08/19/16 0323 08/20/16 1752 08/21/16 0310 08/22/16 0147  NA  --   < > 137 137 137 137 136  K  --   < > 3.8 3.9 4.0  3.9 3.9  CL  --   < > 107 104 104 106 105  CO2  --   < > 24 24 23 24 26   GLUCOSE  --   < > 137* 165* 174* 185* 168*  BUN  --   < > 50* 50* 49* 50* 47*  CREATININE 2.44*  < > 2.37* 2.40* 2.19* 2.19* 2.26*  CALCIUM  --   < > 7.5* 7.7* 7.7* 7.6* 7.6*  MG 2.1  --   --   --   --   --   --   < > = values in this interval not displayed. GFR: Estimated Creatinine Clearance: 68.5 mL/min (by C-G formula based on SCr of 2.26 mg/dL (H)). Liver Function Tests:  Recent Labs Lab 08/17/16 0753  AST 32  ALT 35  ALKPHOS 274*  BILITOT 1.5*  PROT 5.9*  ALBUMIN 1.3*   No results for input(s): LIPASE, AMYLASE in the last 168 hours. No results for input(s): AMMONIA in the last 168 hours. Coagulation Profile: No results for input(s): INR, PROTIME in the last 168 hours. Cardiac Enzymes:  Recent Labs Lab 08/17/16 0241 08/17/16 0753 08/17/16 1405  TROPONINI 0.09* 0.17* 0.10*   BNP (last 3 results) No results for input(s): PROBNP in the last 8760 hours. HbA1C: No results for input(s): HGBA1C in the last 72 hours. CBG:  Recent Labs Lab 08/21/16 1113 08/21/16 1618 08/21/16 2108 08/22/16 0607 08/22/16 1130  GLUCAP 172* 171* 208* 183* 202*   Lipid Profile: No results for input(s): CHOL, HDL, LDLCALC, TRIG, CHOLHDL, LDLDIRECT in the last 72 hours. Thyroid Function Tests: No results for input(s): TSH, T4TOTAL, FREET4, T3FREE, THYROIDAB in the last 72 hours. Anemia Panel: No results for input(s): VITAMINB12, FOLATE, FERRITIN, TIBC, IRON, RETICCTPCT in the last 72 hours. Sepsis Labs: No results for input(s): PROCALCITON, LATICACIDVEN in the last 168 hours.  No results found for this or any previous visit (from the past 240 hour(s)).       Radiology Studies: No results found.      Scheduled Meds: . aspirin EC  81 mg Oral Daily  . carvedilol  25 mg Oral BID WC  . furosemide  120 mg Intravenous Q12H  . heparin  5,000 Units Subcutaneous Q8H  . hydrALAZINE  50 mg Oral BID  .  insulin aspart  0-9 Units Subcutaneous TID WC  . insulin glargine  5 Units Subcutaneous QHS  . isosorbide mononitrate  30 mg Oral Daily  . living well with diabetes book   Does not apply Once  . pantoprazole  40 mg Oral Daily   Continuous Infusions:   LOS: 5 days      Mauricio Gerome Apley, MD [facility] Pager [phone]  If 7PM-7AM, please contact night-coverage [email or web address] Password [email or web address] 08/22/2016, 11:57 AM

## 2016-08-22 NOTE — Progress Notes (Signed)
Physical Therapy Treatment Patient Details Name: Joshua Massey MRN: [id number] DOB: [birth year] Today's Date: 08/22/2016    History of Present Illness 41 yo male with nonischemic cardiomyopthy, EF 35 to 40%, presents with worsening dyspnea and lower extremity edema, CHF exacerbation and pericardial effusion. PMHx: DM, HTN, obesity    PT Comments    Pt pleasant with decreased motivation to mobilize and benefits from encouragement and reassurance to progress activity. Pt denied OOB to recliner due to low seat and unable to stand from low surface due to groin and LLE edema. Pt educated for HEP and encouraged increased mobility with nursing assist. Will continue to follow.   VSS   Follow Up Recommendations  No PT follow up     Equipment Recommendations  Rolling walker with 5" wheels    Recommendations for Other Services       Precautions / Restrictions Precautions Precautions: Fall    Mobility  Bed Mobility Overal bed mobility: Needs Assistance Bed Mobility: Supine to Sit;Sit to Supine     Supine to sit: Min assist Sit to supine: Min assist   General bed mobility comments: assist to fully elevate trunk and assist to move LLE back onto surface with return to bed, increased time and momentum utilized  Transfers Overall transfer level: Needs assistance   Transfers: Sit to/from Stand Sit to Stand: Min guard;From elevated surface         General transfer comment: cues for hand placement and sequence with lack of control on descent  Ambulation/Gait Ambulation/Gait assistance: Min guard Ambulation Distance (Feet): 150 Feet Assistive device: Rolling walker (2 wheeled) Gait Pattern/deviations: Wide base of support;Trunk flexed;Step-through pattern   Gait velocity interpretation: Below normal speed for age/gender General Gait Details: pt with wide BOS, increased sway and maintains flexion to hold RW due to wide BOS and unable to place feet in RW, cues for safety and  posture   Stairs            Wheelchair Mobility    Modified Rankin (Stroke Patients Only)       Balance Overall balance assessment: Needs assistance   Sitting balance-Leahy Scale: Good       Standing balance-Leahy Scale: Fair                      Cognition Arousal/Alertness: Awake/alert Behavior During Therapy: WFL for tasks assessed/performed Overall Cognitive Status: Within Functional Limits for tasks assessed                      Exercises General Exercises - Lower Extremity Short Arc Quad: AAROM;AROM;Left;Right;10 reps;Supine (AAROM on LLE) Heel Slides: AROM;AAROM;Right;Left;Supine;10 reps (AAROM on LLE) Hip ABduction/ADduction: AROM;AAROM;10 reps;Left;Right;Supine (AAROM on LLE)    General Comments        Pertinent Vitals/Pain Pain Score: 5  Pain Location: LLE and groin Pain Descriptors / Indicators: Aching;Tightness Pain Intervention(s): Limited activity within patient's tolerance;Repositioned    Home Living                      Prior Function            PT Goals (current goals can now be found in the care plan section) Progress towards PT goals: Progressing toward goals    Frequency           PT Plan Current plan remains appropriate    Co-evaluation             End of Session  Equipment Utilized During Treatment: Gait belt Activity Tolerance: Patient tolerated treatment well Patient left: in bed;with call bell/phone within Massey (pt denied sitting in chair) Nurse Communication: Mobility status PT Visit Diagnosis: Unsteadiness on feet (R26.81);Muscle weakness (generalized) (M62.81);Difficulty in walking, not elsewhere classified (R26.2);Pain Pain - Right/Left: Right Pain - part of body: Leg     Time: 1020-1043 PT Time Calculation (min) (ACUTE ONLY): 23 min  Charges:  $Gait Training: 8-22 mins $Therapeutic Exercise: 8-22 mins                    G Codes:       Joshua Massey Joshua Massey   Joshua Massey, Joshua Massey

## 2016-08-22 NOTE — Progress Notes (Signed)
Progress Note  Patient Name: Joshua Massey Date of Encounter: 08/22/2016  Primary Cardiologist: Gwenlyn Found  Subjective   41 y.o. male with history of nonischemic cardiomyopathy last EF measured in 2014 was 35-40% presents to the ER because of increasing lower extremity edema and shortness of breath  No chest pain or dyspnea. Still with lots of LE edema.   Inpatient Medications    Scheduled Meds: . aspirin EC  81 mg Oral Daily  . carvedilol  25 mg Oral BID WC  . furosemide  120 mg Intravenous Q12H  . heparin  5,000 Units Subcutaneous Q8H  . hydrALAZINE  50 mg Oral BID  . insulin aspart  0-9 Units Subcutaneous TID WC  . insulin glargine  5 Units Subcutaneous QHS  . isosorbide mononitrate  30 mg Oral Daily  . living well with diabetes book   Does not apply Once  . pantoprazole  40 mg Oral Daily   Continuous Infusions:  PRN Meds: hydrALAZINE, HYDROcodone-acetaminophen, ondansetron **OR** ondansetron (ZOFRAN) IV   Vital Signs    Vitals:   08/21/16 1324 08/21/16 2111 08/22/16 0547 08/22/16 0937  BP: 128/84 132/80 (!) 139/94 129/77  Pulse: 80 77 78   Resp: 20 18 20    Temp: 98.7 F (37.1 C) 97.8 F (36.6 C) 97.4 F (36.3 C)   TempSrc: Oral Oral Oral   SpO2: 96% 93% 94%   Weight:   (!) 359 lb 9.1 oz (163.1 kg)   Height:        Intake/Output Summary (Last 24 hours) at 08/22/16 1005 Last data filed at 08/22/16 0820  Gross per 24 hour  Intake              480 ml  Output             2175 ml  Net            -1695 ml   Filed Weights   08/20/16 0519 08/21/16 0521 08/22/16 0547  Weight: (!) 363 lb 5.1 oz (164.8 kg) (!) 361 lb 1.8 oz (163.8 kg) (!) 359 lb 9.1 oz (163.1 kg)    Telemetry    SR - Personally Reviewed  ECG    N/A - Personally Reviewed  Physical Exam   General: Morbidly obese AA male appearing in no acute distress. Head: Normocephalic, atraumatic.  Neck: Supple without bruits, JVD. Lungs:  Resp regular and unlabored, Diminished bilaterally. Heart:  RRR, S1, S2, no S3, S4, or murmur; no rub. Abdomen: Soft, obese, non-tender, non-distended with normoactive bowel sounds. No hepatomegaly. No rebound/guarding. No obvious abdominal masses. Extremities: No clubbing, cyanosis, overt bilateral LE edema. Distal pedal pulses are 1+ bilaterally, Left LE wrapped Neuro: Alert and oriented X 3. Moves all extremities spontaneously. Psych: Normal affect.  Labs    Chemistry Recent Labs Lab 08/17/16 0753  08/20/16 1752 08/21/16 0310 08/22/16 0147  NA 138  < > 137 137 136  K 4.0  < > 4.0 3.9 3.9  CL 107  < > 104 106 105  CO2 22  < > 23 24 26   GLUCOSE 198*  < > 174* 185* 168*  BUN 50*  < > 49* 50* 47*  CREATININE 2.42*  < > 2.19* 2.19* 2.26*  CALCIUM 7.6*  < > 7.7* 7.6* 7.6*  PROT 5.9*  --   --   --   --   ALBUMIN 1.3*  --   --   --   --   AST 32  --   --   --   --  ALT 35  --   --   --   --   ALKPHOS 274*  --   --   --   --   BILITOT 1.5*  --   --   --   --   GFRNONAA 32*  < > 36* 36* 35*  GFRAA 37*  < > 42* 42* 40*  ANIONGAP 9  < > 10 7 5   < > = values in this interval not displayed.   Hematology Recent Labs Lab 08/17/16 0241 08/17/16 0449 08/17/16 0753 08/18/16 0313  WBC 15.6*  --  15.3* 12.2*  RBC 4.05* 3.92* 3.96* 3.84*  HGB 11.7*  --  11.5* 10.9*  HCT 35.5*  --  34.8* 33.3*  MCV 87.7  --  87.9 86.7  MCH 28.9  --  29.0 28.4  MCHC 33.0  --  33.0 32.7  RDW 16.8*  --  16.8* 16.5*  PLT 315  --  291 341    Cardiac Enzymes Recent Labs Lab 08/17/16 0241 08/17/16 0753 08/17/16 1405  TROPONINI 0.09* 0.17* 0.10*    Recent Labs Lab 08/16/16 2113  TROPIPOC 0.00     BNP Recent Labs Lab 08/16/16 2055  BNP 2,062.6*     DDimer No results for input(s): DDIMER in the last 168 hours.    Radiology    No results found.  Cardiac Studies     Patient Profile     41 y.o. male with PMH of NICM, IDDM, Obesity, GERD and HTN who presented with dyspnea and LE edema.   Assessment & Plan    1. Acute on Chronic  systolic HF: Presented with increasing LE edema and weight gain at home. States he again ran out of his home medications, including lasix and has been taking them whenever he has them. BNP 2062 on admission and CXR with edema. He is grossly volume overloaded on exam.  -- continue BB, ASA, hydralazine, imdur -- echo this admission with reduced EF to 25-30%, from previously reported 40%, with concern for moderate size pericardial effusion. Not tachycardic, no distress noted.  -- remains on IV lasix 120mg  BID, UOP 1.1L yesterday, weight trending down.   2. CKD III: Cr stable with diuresis thus far. Monitor BMET.   3. HTN: Controlled, continue BB, hydralazine and Imdur -- no ARB/ACEi in the setting of CKD  4. IDDM: Per primary  5. Morbid Obesity: Does not adhere to diet restrictions, eats lots of fast food and sodas. He needs aggressive lifestyle modifications  6. Right LE wound: Wrapped. Primary following  Signed, Reino Bellis, NP  08/22/2016, 10:05 AM    Attending Note:   The patient was seen and examined.  Agree with assessment and plan as noted above.  Changes made to the above note as needed.  Patient seen and independently examined with Reino Bellis, NP .   We discussed all aspects of the encounter. I agree with the assessment and plan as stated above.  1. Acute on chronic combined systolic and diastolic CHF: Has gained 50 lbs over the past month He had run out of his meds.  Also eats lots of fast foods frequently .   Advised him not to run out of his meds.  And advised him to avoid fast foods    I have spent a total of 30 minutes with patient reviewing hospital  notes , telemetry, EKGs, labs and examining patient as well as establishing an assessment and plan that was discussed with the patient. > 50%  of time was spent in direct patient care.    Thayer Headings, Brooke Bonito., MD, [facility] 08/22/2016, 11:18 AM [id number]  [place] Pager [phone]

## 2016-08-23 LAB — GLUCOSE, CAPILLARY
GLUCOSE-CAPILLARY: 168 mg/dL — AB (ref 65–99)
Glucose-Capillary: 169 mg/dL — ABNORMAL HIGH (ref 65–99)
Glucose-Capillary: 154 mg/dL — ABNORMAL HIGH (ref 65–99)
Glucose-Capillary: 132 mg/dL — ABNORMAL HIGH (ref 65–99)

## 2016-08-23 LAB — BASIC METABOLIC PANEL
Anion gap: 6 (ref 5–15)
BUN: 46 mg/dL — AB (ref 6–20)
CHLORIDE: 104 mmol/L (ref 101–111)
CO2: 26 mmol/L (ref 22–32)
Calcium: 7.7 mg/dL — ABNORMAL LOW (ref 8.9–10.3)
Creatinine, Ser: 2.28 mg/dL — ABNORMAL HIGH (ref 0.61–1.24)
GFR calc Af Amer: 40 mL/min — ABNORMAL LOW (ref >60)
GFR, EST NON AFRICAN AMERICAN: 34 mL/min — AB (ref >60)
GLUCOSE: 177 mg/dL — AB (ref 65–99)
Potassium: 4 mmol/L (ref 3.5–5.1)
Sodium: 136 mmol/L (ref 135–145)

## 2016-08-23 NOTE — Progress Notes (Signed)
PROGRESS NOTE    Joshua Massey  SPQ:[id number] DOB: [birth year] DOA: 08/16/2016 PCP: Pcp Not In System    Brief Narrative:  41 yo male with nonischemic cardiomyopthy, EF 35 to 40%, presents with worsening dyspnea and lower extremity edema, persistent and worsening over last month. Positive blisters and blebs on his lower extremities over last 24 hours before admission. Positive weight gain 50 lbs. Non compliant with diuretic therapy or fluid restriction. Patient placed on high doses of furosemide for diuresis.   Assessment & Plan:   Principal Problem:   Acute on chronic systolic CHF (congestive heart failure) (HCC) Active Problems:   Essential hypertension   CHF (congestive heart failure) (HCC)   Type 2 diabetes mellitus with vascular disease (HCC)   Acute renal failure (HCC)   Acute systolic CHF (congestive heart failure) ([place])   Peripheral edema   AKI (acute kidney injury) ([place])   1. Decompensated heart failure. Tolerating well aggressive diuresis with furosemide 120 mg bid,urine output 2428ml over last 24 hours with a net fluid balance since admission of -552ml. Continuecarvedilol andisosorbide-hydralazine, noace inh due to risk of worsening AKI.  2. Pericardial effusion. Tolerating aggressive diuresis well.   2. AKI. Renal function with cr at 2.28, from 2,26 with K at 4.0 and serum bicarbonate at 26. Will follow on renal panel in am.  3. HTN.Continue coreg 25 mg bid, hydralazine and isosorbide. Systolic blood pressure 333 to 140 mmHg.   4. T2DM. Glucose cover and monitoring with insulin sliding scale. Capillary glucose [phone].with a basal insulin with 5 units.    5. Anemia of iron deficiency. Iron supplements with no complications.   6, Right lower extremity ulcerated wound. Continue local wound care, improved pain control with hydrocodone.   DVT prophylaxis:heparin Code Status: full  Family Communication:No family at the bedside    Disposition Plan:home   Consultants:Cardiology   Procedures:  Antimicrobials   Subjective: Patient feeling better, no nausea or vomiting, noted decreased edema on upper extremities   Objective: Vitals:   08/22/16 1047 08/22/16 1605 08/22/16 2127 08/23/16 0413  BP:  124/77 (!) 144/98 (!) 143/91  Pulse: 83 79 77 77  Resp:  18 18 18   Temp:  97.6 F (36.4 C) 98.7 F (37.1 C) 98.3 F (36.8 C)  TempSrc:  Oral Oral Oral  SpO2:  98% 98% 99%  Weight:    (!) 164 kg (361 lb 8.9 oz)  Height:        Intake/Output Summary (Last 24 hours) at 08/23/16 1124 Last data filed at 08/23/16 1032  Gross per 24 hour  Intake              530 ml  Output             2625 ml  Net            -2095 ml   Filed Weights   08/21/16 0521 08/22/16 0547 08/23/16 0413  Weight: (!) 163.8 kg (361 lb 1.8 oz) (!) 163.1 kg (359 lb 9.1 oz) (!) 164 kg (361 lb 8.9 oz)    Examination:  General exam: Not in pain or dyspnea E ENT: mild pallor but no icterus.  Respiratory system: Clear to auscultation. Respiratory effort normal. Mild decreased breath sounds at bases, no wheezing or rhonchi.  Cardiovascular system: S1 & S2 heard, RRR. No JVD, murmurs, rubs, gallops or clicks. +++ pitting edema. Gastrointestinal system: Abdomen is nondistended, soft and nontender. No organomegaly or masses felt. Normal bowel sounds heard. Central nervous  system: Alert and oriented. No focal neurological deficits. Extremities: Symmetric 5 x 5 power. Skin: No rashes, lesions or ulcers    Data Reviewed: I have personally reviewed following labs and imaging studies  CBC:  Recent Labs Lab 08/16/16 2055 08/17/16 0241 08/17/16 0753 08/18/16 0313  WBC 15.6* 15.6* 15.3* 12.2*  NEUTROABS  --   --  12.9* 9.5*  HGB 12.2* 11.7* 11.5* 10.9*  HCT 37.1* 35.5* 34.8* 33.3*  MCV 87.3 87.7 87.9 86.7  PLT 338 315 291 419   Basic Metabolic Panel:  Recent Labs Lab 08/17/16 0241  08/19/16 0323 08/20/16 1752 08/21/16 0310  08/22/16 0147 08/23/16 0227  NA  --   < > 137 137 137 136 136  K  --   < > 3.9 4.0 3.9 3.9 4.0  CL  --   < > 104 104 106 105 104  CO2  --   < > 24 23 24 26 26   GLUCOSE  --   < > 165* 174* 185* 168* 177*  BUN  --   < > 50* 49* 50* 47* 46*  CREATININE 2.44*  < > 2.40* 2.19* 2.19* 2.26* 2.28*  CALCIUM  --   < > 7.7* 7.7* 7.6* 7.6* 7.7*  MG 2.1  --   --   --   --   --   --   < > = values in this interval not displayed. GFR: Estimated Creatinine Clearance: 68.2 mL/min (by C-G formula based on SCr of 2.28 mg/dL (H)). Liver Function Tests:  Recent Labs Lab 08/17/16 0753  AST 32  ALT 35  ALKPHOS 274*  BILITOT 1.5*  PROT 5.9*  ALBUMIN 1.3*   No results for input(s): LIPASE, AMYLASE in the last 168 hours. No results for input(s): AMMONIA in the last 168 hours. Coagulation Profile: No results for input(s): INR, PROTIME in the last 168 hours. Cardiac Enzymes:  Recent Labs Lab 08/17/16 0241 08/17/16 0753 08/17/16 1405  TROPONINI 0.09* 0.17* 0.10*   BNP (last 3 results) No results for input(s): PROBNP in the last 8760 hours. HbA1C: No results for input(s): HGBA1C in the last 72 hours. CBG:  Recent Labs Lab 08/22/16 0607 08/22/16 1130 08/22/16 1655 08/22/16 2118 08/23/16 0610  GLUCAP 183* 202* 162* 171* 168*   Lipid Profile: No results for input(s): CHOL, HDL, LDLCALC, TRIG, CHOLHDL, LDLDIRECT in the last 72 hours. Thyroid Function Tests: No results for input(s): TSH, T4TOTAL, FREET4, T3FREE, THYROIDAB in the last 72 hours. Anemia Panel: No results for input(s): VITAMINB12, FOLATE, FERRITIN, TIBC, IRON, RETICCTPCT in the last 72 hours. Sepsis Labs: No results for input(s): PROCALCITON, LATICACIDVEN in the last 168 hours.  No results found for this or any previous visit (from the past 240 hour(s)).       Radiology Studies: No results found.      Scheduled Meds: . aspirin EC  81 mg Oral Daily  . carvedilol  25 mg Oral BID WC  . furosemide  120 mg  Intravenous Q12H  . heparin  5,000 Units Subcutaneous Q8H  . hydrALAZINE  50 mg Oral BID  . insulin aspart  0-9 Units Subcutaneous TID WC  . insulin glargine  5 Units Subcutaneous QHS  . isosorbide mononitrate  30 mg Oral Daily  . living well with diabetes book   Does not apply Once  . pantoprazole  40 mg Oral Daily   Continuous Infusions:   LOS: 6 days      Mauricio Gerome Apley, MD [facility] Pager  [phone]  If 7PM-7AM, please contact night-coverage [email or web address] Password [email or web address] 08/23/2016, 11:24 AM

## 2016-08-24 LAB — GLUCOSE, CAPILLARY
GLUCOSE-CAPILLARY: 159 mg/dL — AB (ref 65–99)
Glucose-Capillary: 170 mg/dL — ABNORMAL HIGH (ref 65–99)
Glucose-Capillary: 172 mg/dL — ABNORMAL HIGH (ref 65–99)
Glucose-Capillary: 164 mg/dL — ABNORMAL HIGH (ref 65–99)

## 2016-08-24 LAB — CREATININE, URINE, RANDOM: CREATININE, URINE: 40.34 mg/dL

## 2016-08-24 MED ORDER — FERROUS SULFATE 325 (65 FE) MG PO TABS
325.0000 mg | ORAL_TABLET | Freq: Two times a day (BID) | ORAL | Status: DC
  Administered 2016-08-24 – 2016-09-02 (×17): 325 mg via ORAL
  Filled 2016-08-24 (×17): qty 1

## 2016-08-24 MED ORDER — FUROSEMIDE 10 MG/ML IJ SOLN
120.0000 mg | Freq: Three times a day (TID) | INTRAMUSCULAR | Status: DC
  Administered 2016-08-24 – 2016-08-25 (×3): 120 mg via INTRAVENOUS
  Filled 2016-08-24 (×4): qty 12

## 2016-08-24 NOTE — Progress Notes (Signed)
Physical Therapy Treatment Patient Details Name: Joshua Massey MRN: [id number] DOB: 1976-05-11 Today's Date: 08/24/2016    History of Present Illness 41 yo male with nonischemic cardiomyopthy, EF 35 to 40%, presents with worsening dyspnea and lower extremity edema, CHF exacerbation and pericardial effusion. PMHx: DM, HTN, obesity    PT Comments    Pt ambulated 150' with RW with lateral sway and wide BOS.  He seemed more motivated today, but unable to ambulate farther due to pain.  Discussed different chair with nursing, as his is low and does not feel he could sit in it comfortably.  Bariatric chair would probably be too big and cumbersome as well.  Nurse was going to speak with charge nurse and see what options they had.  Encouraged pt to sit EOB for meals and throughout the day.   Follow Up Recommendations  No PT follow up     Equipment Recommendations  None recommended by PT (RW has been delivered)    Recommendations for Other Services       Precautions / Restrictions Precautions Precautions: Fall Restrictions Weight Bearing Restrictions: No    Mobility  Bed Mobility Overal bed mobility: Needs Assistance Bed Mobility: Supine to Sit;Sit to Supine     Supine to sit: Min guard Sit to supine: Min assist   General bed mobility comments: supine > sit with min/guard with bed flat with heavy use of rail.  MIN A for L LE to return supine.  Transfers Overall transfer level: Needs assistance Equipment used: Rolling walker (2 wheeled) Transfers: Sit to/from Stand Sit to Stand: Min guard;From elevated surface         General transfer comment: slow transition from sit > stand  Ambulation/Gait Ambulation/Gait assistance: Min guard Ambulation Distance (Feet): 150 Feet Assistive device: Rolling walker (2 wheeled) Gait Pattern/deviations: Wide base of support;Step-through pattern;Trunk flexed     General Gait Details: Wide BOS with lateral sway with decreased ability to  stand within the [employer] Mobility    Modified Rankin (Stroke Patients Only)       Balance Overall balance assessment: Needs assistance   Sitting balance-Leahy Scale: Good     Standing balance support: No upper extremity supported;During functional activity Standing balance-Leahy Scale: Fair                      Cognition Arousal/Alertness: Awake/alert Behavior During Therapy: WFL for tasks assessed/performed Overall Cognitive Status: Within Functional Limits for tasks assessed                      Exercises General Exercises - Lower Extremity Gluteal Sets: 10 reps;Supine Other Exercises Other Exercises: heel cord stretches    General Comments        Pertinent Vitals/Pain Pain Assessment: 0-10 Pain Score: 8  Pain Location: L LE Pain Descriptors / Indicators: Sore;Tightness Pain Intervention(s): Limited activity within patient's tolerance;Repositioned;Monitored during session    Home Living                      Prior Function            PT Goals (current goals can now be found in the care plan section) Acute Rehab PT Goals Patient Stated Goal: to go home PT Goal Formulation: With patient Time For Goal Achievement: 08/31/16 Potential to Achieve Goals: Good Progress towards PT goals: Progressing toward goals  Frequency    Min 3X/week      PT Plan Current plan remains appropriate    Co-evaluation             End of Session Equipment Utilized During Treatment: Gait belt Activity Tolerance: Patient tolerated treatment well Patient left: in bed;with call bell/phone within reach;with nursing/sitter in room Nurse Communication: Mobility status PT Visit Diagnosis: Unsteadiness on feet (R26.81);Muscle weakness (generalized) (M62.81);Difficulty in walking, not elsewhere classified (R26.2);Pain Pain - Right/Left: Left Pain - part of body: Leg     Time: 0737-1062 PT Time Calculation  (min) (ACUTE ONLY): 26 min  Charges:  $Gait Training: 8-22 mins $Therapeutic Activity: 8-22 mins                    G Codes:       Kiran Carline LUBECK 08/24/2016, 12:45 PM

## 2016-08-24 NOTE — Progress Notes (Signed)
PROGRESS NOTE    Joshua Massey  WIO:[id number] DOB: Nov 10, 1975 DOA: 08/16/2016 PCP: Pcp Not In System   Brief Narrative:  Joshua Massey is a 41 year old male with history of nonischemic cardiomyopathy, last EF 35-40% in 2014, who presented to the ER because of increasing lower extremity edema and shortness of breath. Over the last 1 month patient has been having increasing lower extremity edema and exertional shortness of breath. Denies any chest pain. Patient noticed blisters on his left lower extremity. Patient states he has gained at least 50 pounds over the last 1 month.  Assessment & Plan:   1. Acute on chronic systolic heart failure. Weights improving and I/O balance remains negative. Significant edema. Continue diuresis with furosemide 120 mg bid. Continue aspirin, carvedilol, isosorbide, hydralazine, noACEI due to AKI.  2. Pericardial effusion. No sign of tamponade. Continue diuresis.   3. Acute renal failure. Creatinine acutely elevated on admission, slightly improved. Large amount of proteinura, and low albumin characteristic of nephrotic syndrome-likely due to diabetes. Electrolytes stable. Continue to monitor.    4. Essential hypertension. Stable. Continue carvedilol, hydralazine and isosorbide.   5. Uncontrolled DM Type 2. Continue SSI and Lantus.  6. Anemia of iron deficiency. Iron supplement. Recheck CBC in am.   7. Left lower extremity wound. Continue local wound care. Continue tylenol or norco for pain.   DVT prophylaxis: Heparin Code Status: Full Code Family Communication: No family at bedside Disposition Plan: home when ready   Consultants:   Cardiology  Procedures:    Echocardiogram  Study Conclusions  - Left ventricle: The cavity size was normal. Wall thickness was    increased in a pattern of mild LVH. Systolic function was    severely reduced. The estimated ejection fraction was in the    range of 25% to 30%. No apical thrombus with Defininty  contrast.    Diffuse hypokinesis. The study is not technically sufficient to    allow evaluation of LV diastolic function.  - Ventricular septum: Septal motion showed paradox. The contour    showed diastolic flattening and systolic flattening.  - Mitral valve: Mildly thickened leaflets . There was trivial    regurgitation.  - Left atrium: Moderately dilated.  - Right ventricle: Poorly visualized. The cavity size was    moderately dilated.  - Right atrium: Moderately dilated.  - Pulmonic valve: There was mild regurgitation.  - Pulmonary arteries: PA peak pressure: 56 mm Hg (S).  - Inferior vena cava: The vessel was dilated. The respirophasic    diameter changes were blunted (< 50%), consistent with elevated    central venous pressure.  - Pericardium, extracardiac: Cannot exclude tamponade physiology -    clinical correlation is advised.  - Moderate size pericardial effusion   Antimicrobials:   None   Subjective: Patient reports breathing has improved. Denies chest pain, nausea, and vomiting.   Objective: Vitals:   08/23/16 0413 08/23/16 1700 08/23/16 2207 08/24/16 0519  BP: (!) 143/91 126/79 (!) 145/94 136/86  Pulse: 77 76 78 78  Resp: 18 20 20 20   Temp: 98.3 F (36.8 C)  98.3 F (36.8 C) 97.6 F (36.4 C)  TempSrc: Oral  Oral Oral  SpO2: 99% 98% 97% 94%  Weight: (!) 164 kg (361 lb 8.9 oz)   (!) 162.6 kg (358 lb 7.5 oz)  Height:        Intake/Output Summary (Last 24 hours) at 08/24/16 1058 Last data filed at 08/24/16 0500  Gross per 24 hour  Intake              480 ml  Output             1175 ml  Net             -695 ml   Filed Weights   08/22/16 0547 08/23/16 0413 08/24/16 0519  Weight: (!) 163.1 kg (359 lb 9.1 oz) (!) 164 kg (361 lb 8.9 oz) (!) 162.6 kg (358 lb 7.5 oz)    Examination:  General exam: Appears calm and comfortable. Deconditioned.  Respiratory system: Clear to auscultation. Respiratory effort normal. Cardiovascular system: S1 & S2 heard, RRR.  No JVD, murmurs, rubs, gallops or clicks. 4+ edema Gastrointestinal system: Abdomen is nondistended, soft and nontender.  Central nervous system: Alert and oriented. No focal neurological deficits. Extremities: Symmetric 5 x 5 power. Skin: Bandage on left lower extremity.  Psychiatry: Judgement and insight appear normal. Mood & affect appropriate.     Data Reviewed: I have personally reviewed following labs and imaging studies  CBC:  Recent Labs Lab 08/18/16 0313  WBC 12.2*  NEUTROABS 9.5*  HGB 10.9*  HCT 33.3*  MCV 86.7  PLT 440   Basic Metabolic Panel:  Recent Labs Lab 08/19/16 0323 08/20/16 1752 08/21/16 0310 08/22/16 0147 08/23/16 0227  NA 137 137 137 136 136  K 3.9 4.0 3.9 3.9 4.0  CL 104 104 106 105 104  CO2 24 23 24 26 26   GLUCOSE 165* 174* 185* 168* 177*  BUN 50* 49* 50* 47* 46*  CREATININE 2.40* 2.19* 2.19* 2.26* 2.28*  CALCIUM 7.7* 7.7* 7.6* 7.6* 7.7*   GFR: Estimated Creatinine Clearance: 67.8 mL/min (by C-G formula based on SCr of 2.28 mg/dL (H)). Liver Function Tests: No results for input(s): AST, ALT, ALKPHOS, BILITOT, PROT, ALBUMIN in the last 168 hours. No results for input(s): LIPASE, AMYLASE in the last 168 hours. No results for input(s): AMMONIA in the last 168 hours. Coagulation Profile: No results for input(s): INR, PROTIME in the last 168 hours. Cardiac Enzymes:  Recent Labs Lab 08/17/16 1405  TROPONINI 0.10*   BNP (last 3 results) No results for input(s): PROBNP in the last 8760 hours. HbA1C: No results for input(s): HGBA1C in the last 72 hours. CBG:  Recent Labs Lab 08/23/16 0610 08/23/16 1152 08/23/16 1640 08/23/16 2204 08/24/16 0615  GLUCAP 168* 169* 154* 132* 159*   Lipid Profile: No results for input(s): CHOL, HDL, LDLCALC, TRIG, CHOLHDL, LDLDIRECT in the last 72 hours. Thyroid Function Tests: No results for input(s): TSH, T4TOTAL, FREET4, T3FREE, THYROIDAB in the last 72 hours. Anemia Panel: No results for  input(s): VITAMINB12, FOLATE, FERRITIN, TIBC, IRON, RETICCTPCT in the last 72 hours. Urine analysis:    Component Value Date/Time   COLORURINE AMBER (A) 08/17/2016 0129   APPEARANCEUR HAZY (A) 08/17/2016 0129   LABSPEC 1.016 08/17/2016 0129   PHURINE 5.0 08/17/2016 0129   GLUCOSEU 50 (A) 08/17/2016 0129   HGBUR SMALL (A) 08/17/2016 0129   BILIRUBINUR NEGATIVE 08/17/2016 0129   KETONESUR NEGATIVE 08/17/2016 0129   PROTEINUR >=300 (A) 08/17/2016 0129   UROBILINOGEN 0.2 09/02/2009 0935   NITRITE NEGATIVE 08/17/2016 0129   LEUKOCYTESUR NEGATIVE 08/17/2016 0129   Sepsis Labs: @LABRCNTIP (procalcitonin:4,lacticidven:4)  )No results found for this or any previous visit (from the past 240 hour(s)).       Radiology Studies: No results found.      Scheduled Meds: . aspirin EC  81 mg Oral Daily  . carvedilol  25 mg  Oral BID WC  . furosemide  120 mg Intravenous Q12H  . heparin  5,000 Units Subcutaneous Q8H  . hydrALAZINE  50 mg Oral BID  . insulin aspart  0-9 Units Subcutaneous TID WC  . insulin glargine  5 Units Subcutaneous QHS  . isosorbide mononitrate  30 mg Oral Daily  . living well with diabetes book   Does not apply Once  . pantoprazole  40 mg Oral Daily   Continuous Infusions:   LOS: 7 days    Aliene Altes, PA-S [facility] 08/24/2016   If 7PM-7AM, please contact night-coverage [email or web address] Password [facility] 08/24/2016, 10:58 AM   Attending MD note  Patient was seen, examined,treatment plan was discussed with the PA-S.  I have personally reviewed the clinical findings, lab, imaging studies and management of this patient in detail. I agree with the documentation, as recorded by the PA-S.   Patient Is to be significantly volume overloaded, he is lying in bed-has no acute complaints.  On Exam: Gen. exam: Awake, alert, not in any distress Chest: Good air entry bilaterally, no rhonchi or rales CVS: S1-S2 regular, no murmurs Abdomen: Soft, nontender and  nondistended Neurology: Non-focal Skin: No rash or lesions  Massive anasarca: Likely due to acute on chronic systolic heart failure and nephrotic syndrome due to diabetes.  Plan Increase Lasix to 3 times a day dosing Will require several more days of hospitalization  Rest as above  [facility]

## 2016-08-24 NOTE — Progress Notes (Signed)
Progress Note  Patient Name: Joshua Massey Date of Encounter: 08/24/2016  Primary Cardiologist: Gwenlyn Found  Subjective   41 y.o. male with history of nonischemic cardiomyopathy last EF measured in 2014 was 35-40% presents to the ER because of increasing lower extremity edema and shortness of breath  No chest pain or dyspnea. Still with lots of LE edema.  I/O are negative  -6.7    Inpatient Medications    Scheduled Meds: . aspirin EC  81 mg Oral Daily  . carvedilol  25 mg Oral BID WC  . furosemide  120 mg Intravenous Q12H  . heparin  5,000 Units Subcutaneous Q8H  . hydrALAZINE  50 mg Oral BID  . insulin aspart  0-9 Units Subcutaneous TID WC  . insulin glargine  5 Units Subcutaneous QHS  . isosorbide mononitrate  30 mg Oral Daily  . living well with diabetes book   Does not apply Once  . pantoprazole  40 mg Oral Daily   Continuous Infusions:  PRN Meds: hydrALAZINE, HYDROcodone-acetaminophen, ondansetron **OR** ondansetron (ZOFRAN) IV   Vital Signs    Vitals:   08/23/16 0413 08/23/16 1700 08/23/16 2207 08/24/16 0519  BP: (!) 143/91 126/79 (!) 145/94 136/86  Pulse: 77 76 78 78  Resp: 18 20 20 20   Temp: 98.3 F (36.8 C)  98.3 F (36.8 C) 97.6 F (36.4 C)  TempSrc: Oral  Oral Oral  SpO2: 99% 98% 97% 94%  Weight: (!) 361 lb 8.9 oz (164 kg)   (!) 358 lb 7.5 oz (162.6 kg)  Height:        Intake/Output Summary (Last 24 hours) at 08/24/16 1144 Last data filed at 08/24/16 0500  Gross per 24 hour  Intake              480 ml  Output             1175 ml  Net             -695 ml   Filed Weights   08/22/16 0547 08/23/16 0413 08/24/16 0519  Weight: (!) 359 lb 9.1 oz (163.1 kg) (!) 361 lb 8.9 oz (164 kg) (!) 358 lb 7.5 oz (162.6 kg)    Telemetry    SR - Personally Reviewed  ECG    N/A - Personally Reviewed  Physical Exam   General: Morbidly obese AA male appearing in no acute distress.  , anasarca Head: Normocephalic, atraumatic.  Neck: Supple without bruits,  JVD. Lungs:  Resp regular and unlabored, Diminished bilaterally. Heart: RRR, S1, S2, no S3, S4, or murmur; no rub. Abdomen: pitting edema of abdominal wall  Extremities: No clubbing, cyanosis, overt bilateral LE edema. Distal pedal pulses are 1+ bilaterally, Left LE wrapped Neuro: Alert and oriented X 3. Moves all extremities spontaneously. Psych: Normal affect.  Labs    Chemistry  Recent Labs Lab 08/21/16 0310 08/22/16 0147 08/23/16 0227  NA 137 136 136  K 3.9 3.9 4.0  CL 106 105 104  CO2 24 26 26   GLUCOSE 185* 168* 177*  BUN 50* 47* 46*  CREATININE 2.19* 2.26* 2.28*  CALCIUM 7.6* 7.6* 7.7*  GFRNONAA 36* 35* 34*  GFRAA 42* 40* 40*  ANIONGAP 7 5 6      Hematology  Recent Labs Lab 08/18/16 0313  WBC 12.2*  RBC 3.84*  HGB 10.9*  HCT 33.3*  MCV 86.7  MCH 28.4  MCHC 32.7  RDW 16.5*  PLT 341    Cardiac Enzymes  Recent Labs Lab 08/17/16  1405  TROPONINI 0.10*   No results for input(s): TROPIPOC in the last 168 hours.   BNPNo results for input(s): BNP, PROBNP in the last 168 hours.   DDimer No results for input(s): DDIMER in the last 168 hours.    Radiology    No results found.  Cardiac Studies     Patient Profile     41 y.o. male with PMH of NICM, IDDM, Obesity, GERD and HTN who presented with dyspnea and LE edema.   Assessment & Plan    1. Acute on Chronic systolic HF: Presented with increasing LE edema and weight gain at home. States he again ran out of his home medications, including lasix and has been taking them whenever he has them. BNP 2062 on admission and CXR with edema. He is grossly volume overloaded on exam.  He needs continued diuresis. Continue IV Lasix  He probably has 40 -50 lbs of fluid    2. CKD III: Cr stable with diuresis thus far. Monitor BMET.   3. HTN: Controlled, continue BB, hydralazine and Imdur -- no ARB/ACEi in the setting of CKD   4. IDDM: Per primary  5. Morbid Obesity: Does not adhere to diet  restrictions, eats lots of fast food and sodas. He needs aggressive lifestyle modifications  6. Right LE wound: Wrapped. Primary following    We will sign off.  Call for questions He will return to see Dr. Gwenlyn Found in the office.   Signed, Mertie Moores, MD  08/24/2016, 11:44 AM

## 2016-08-25 LAB — BASIC METABOLIC PANEL
Anion gap: 10 (ref 5–15)
BUN: 44 mg/dL — AB (ref 6–20)
CHLORIDE: 100 mmol/L — AB (ref 101–111)
CO2: 26 mmol/L (ref 22–32)
Calcium: 8.1 mg/dL — ABNORMAL LOW (ref 8.9–10.3)
Creatinine, Ser: 2.17 mg/dL — ABNORMAL HIGH (ref 0.61–1.24)
GFR calc non Af Amer: 37 mL/min — ABNORMAL LOW (ref >60)
GFR, EST AFRICAN AMERICAN: 42 mL/min — AB (ref >60)
Glucose, Bld: 162 mg/dL — ABNORMAL HIGH (ref 65–99)
Potassium: 4.2 mmol/L (ref 3.5–5.1)
SODIUM: 136 mmol/L (ref 135–145)

## 2016-08-25 LAB — GLUCOSE, CAPILLARY
GLUCOSE-CAPILLARY: 152 mg/dL — AB (ref 65–99)
GLUCOSE-CAPILLARY: 152 mg/dL — AB (ref 65–99)
Glucose-Capillary: 193 mg/dL — ABNORMAL HIGH (ref 65–99)
Glucose-Capillary: 162 mg/dL — ABNORMAL HIGH (ref 65–99)

## 2016-08-25 LAB — MAGNESIUM: MAGNESIUM: 1.8 mg/dL (ref 1.7–2.4)

## 2016-08-25 LAB — UREA NITROGEN, URINE: Urea Nitrogen, Ur: 271 mg/dL

## 2016-08-25 MED ORDER — FUROSEMIDE 10 MG/ML IJ SOLN
160.0000 mg | Freq: Three times a day (TID) | INTRAVENOUS | Status: DC
  Administered 2016-08-25 – 2016-08-30 (×15): 160 mg via INTRAVENOUS
  Filled 2016-08-25 (×16): qty 16

## 2016-08-25 MED ORDER — POTASSIUM CHLORIDE CRYS ER 20 MEQ PO TBCR
20.0000 meq | EXTENDED_RELEASE_TABLET | Freq: Every day | ORAL | Status: DC
  Administered 2016-08-25 – 2016-08-26 (×2): 20 meq via ORAL
  Filled 2016-08-25 (×2): qty 1

## 2016-08-25 NOTE — Progress Notes (Signed)
PROGRESS NOTE    Joshua Massey  LKG:[id number] DOB: 1976/05/17 DOA: 08/16/2016 PCP: Pcp Not In System    Brief Narrative:  Joshua Massey is a 41 year old male with history of nonischemic cardiomyopathy, last EF 35-40% in 2014, who presented to the ER for evaluation of massive anasarca. He unfortunately was noncompliant to diuretics.Admitted and started on high-dose Lasix. Cipro for further details  Assessment & Plan:  1. Massive anasarca due to Acute on chronic systolic heart failure and diabetic nephropathy:  remains with significant volume on board, increase Lasix to 160 mg 3 times a day. Asked RN to ensure patient more compliant with fluid restriction.Weights improving and I/O total balance remains negative. Continue aspirin, carvedilol, isosorbide, hydralazine, noACEI due to AKI.  2. Pericardial effusion: No sign of tamponade. Continue diuresis.   3. Acute renal failure: Not sure if this is due to cardiorenal syndrome, and does appear to have significant amount of proteinuria-which is likely due to diabetic nephropathy. Creatinine stable on high-dose Lasix. Continue to monitor.    4. Essential hypertension: Stable. Continuecarvedilol, hydralazine and isosorbide.   5. Uncontrolled DM Type 2: Continue SSI and Lantus.  6. Anemia of iron deficiency: Continue Iron supplement.   7. Left lower extremity wound: Continue local wound care. Continue tylenol or norco for pain.   DVT prophylaxis: Heparin Code Status: Full Code Family Communication: No family at bedside Disposition Plan: home when ready-will require several more days of hospitalization   Consultants:   Cardiology  Procedures:    Echocardiogram ? Study Conclusions             - Left ventricle: The cavity size was normal. Wall thickness was             increased in a pattern of mild LVH. Systolic function was             severely reduced. The estimated ejection fraction was in the             range  of 25% to 30%. No apical thrombus with Defininty contrast.             Diffuse hypokinesis. The study is not technically sufficient to             allow evaluation of LV diastolic function.             - Ventricular septum: Septal motion showed paradox. The contour             showed diastolic flattening and systolic flattening.             - Mitral valve: Mildly thickened leaflets . There was trivial             regurgitation.             - Left atrium: Moderately dilated.             - Right ventricle: Poorly visualized. The cavity size was             moderately dilated.             - Right atrium: Moderately dilated.             - Pulmonic valve: There was mild regurgitation.             - Pulmonary arteries: PA peak pressure: 56 mm Hg (S).             - Inferior vena cava: The vessel was dilated. The  respirophasic             diameter changes were blunted (<50%), consistent with elevated             central venous pressure.             - Pericardium, extracardiac: Cannot exclude tamponade physiology -       clinical correlation is advised.       - Moderate size pericardial effusion   Antimicrobials:   None   Subjective: Patient denies SOB, CP, nausea, and vomiting.   Objective: Vitals:   08/24/16 1411 08/24/16 2128 08/25/16 0539 08/25/16 0930  BP: 114/70 133/79 125/84 131/78  Pulse: 80 81 76 80  Resp: 20 20 18 18   Temp: 98.9 F (37.2 C) 99.2 F (37.3 C) 98.7 F (37.1 C)   TempSrc: Oral Oral Oral   SpO2: 96% 95% 97% 95%  Weight:   (!) 161.3 kg (355 lb 9.6 oz)   Height:        Intake/Output Summary (Last 24 hours) at 08/25/16 0940 Last data filed at 08/25/16 0317  Gross per 24 hour  Intake              890 ml  Output             1675 ml  Net             -785 ml   Filed Weights   08/23/16 0413 08/24/16 0519 08/25/16 0539  Weight: (!) 164 kg (361 lb 8.9 oz) (!) 162.6 kg (358 lb 7.5 oz) (!) 161.3 kg (355 lb 9.6 oz)    Examination:  General  exam: Appears calm and comfortable. Deconditioned.  Respiratory system: Clear to auscultation. Respiratory effort normal. Cardiovascular system: S1 & S2 heard, RRR. No JVD, murmurs, rubs, gallops or clicks. 4+ edema Gastrointestinal system: Abdomen is nondistended, soft and nontender.  Central nervous system: Alert and oriented. No focal neurological deficits. Extremities: Symmetric 5 x 5 power. Skin: Bandage on left lower extremity.  Psychiatry: Judgement and insight appear normal. Mood & affect appropriate.    Data Reviewed: I have personally reviewed following labs and imaging studies  CBC: No results for input(s): WBC, NEUTROABS, HGB, HCT, MCV, PLT in the last 168 hours. Basic Metabolic Panel:  Recent Labs Lab 08/20/16 1752 08/21/16 0310 08/22/16 0147 08/23/16 0227 08/25/16 0454  NA 137 137 136 136 136  K 4.0 3.9 3.9 4.0 4.2  CL 104 106 105 104 100*  CO2 23 24 26 26 26   GLUCOSE 174* 185* 168* 177* 162*  BUN 49* 50* 47* 46* 44*  CREATININE 2.19* 2.19* 2.26* 2.28* 2.17*  CALCIUM 7.7* 7.6* 7.6* 7.7* 8.1*  MG  --   --   --   --  1.8   GFR: Estimated Creatinine Clearance: 70.9 mL/min (by C-G formula based on SCr of 2.17 mg/dL (H)). Liver Function Tests: No results for input(s): AST, ALT, ALKPHOS, BILITOT, PROT, ALBUMIN in the last 168 hours. No results for input(s): LIPASE, AMYLASE in the last 168 hours. No results for input(s): AMMONIA in the last 168 hours. Coagulation Profile: No results for input(s): INR, PROTIME in the last 168 hours. Cardiac Enzymes: No results for input(s): CKTOTAL, CKMB, CKMBINDEX, TROPONINI in the last 168 hours. BNP (last 3 results) No results for input(s): PROBNP in the last 8760 hours. HbA1C: No results for input(s): HGBA1C in the last 72 hours. CBG:  Recent Labs Lab 08/24/16 0615 08/24/16 1148 08/24/16 1612 08/24/16 2116 08/25/16 9323  GLUCAP 159* 170* 172* 164* 152*   Lipid Profile: No results for input(s): CHOL, HDL, LDLCALC,  TRIG, CHOLHDL, LDLDIRECT in the last 72 hours. Thyroid Function Tests: No results for input(s): TSH, T4TOTAL, FREET4, T3FREE, THYROIDAB in the last 72 hours. Anemia Panel: No results for input(s): VITAMINB12, FOLATE, FERRITIN, TIBC, IRON, RETICCTPCT in the last 72 hours. Urine analysis:    Component Value Date/Time   COLORURINE AMBER (A) 08/17/2016 0129   APPEARANCEUR HAZY (A) 08/17/2016 0129   LABSPEC 1.016 08/17/2016 0129   PHURINE 5.0 08/17/2016 0129   GLUCOSEU 50 (A) 08/17/2016 0129   HGBUR SMALL (A) 08/17/2016 0129   BILIRUBINUR NEGATIVE 08/17/2016 0129   KETONESUR NEGATIVE 08/17/2016 0129   PROTEINUR >=300 (A) 08/17/2016 0129   UROBILINOGEN 0.2 09/02/2009 0935   NITRITE NEGATIVE 08/17/2016 0129   LEUKOCYTESUR NEGATIVE 08/17/2016 0129   Sepsis Labs: @LABRCNTIP (procalcitonin:4,lacticidven:4)  )No results found for this or any previous visit (from the past 240 hour(s)).       Radiology Studies: No results found.      Scheduled Meds: . aspirin EC  81 mg Oral Daily  . carvedilol  25 mg Oral BID WC  . ferrous sulfate  325 mg Oral BID WC  . furosemide  120 mg Intravenous Q8H  . heparin  5,000 Units Subcutaneous Q8H  . hydrALAZINE  50 mg Oral BID  . insulin aspart  0-9 Units Subcutaneous TID WC  . insulin glargine  5 Units Subcutaneous QHS  . isosorbide mononitrate  30 mg Oral Daily  . living well with diabetes book   Does not apply Once  . pantoprazole  40 mg Oral Daily   Continuous Infusions:   LOS: 8 days     Aliene Altes, PA-S [facility] 08/25/2016   If 7PM-7AM, please contact night-coverage [email or web address] Password [facility] 08/25/2016, 9:40 AM   Attending MD note  Patient was seen, examined,treatment plan was discussed with the PA-S.  I have personally reviewed the clinical findings, lab, imaging studies and management of this patient in detail. I agree with the documentation, as recorded by the PA-S.   Patient and comfortably in bed-continues to  have massive volume overload.  On Exam: Gen. exam: Awake, alert, not in any distress Chest: Good air entry bilaterally, no rhonchi or rales CVS: S1-S2 regular, no murmurs Abdomen: Soft, nontender and nondistended Extremities: With 4+ pitting edema up to the upper thighs Neurology: Non-focal Skin: No rash or lesions  Impression: Massive anasarca: Due to acute systolic heart failure and underlying nephrotic syndrome from diabetes.  Plan Increase Lasix 260 mg 3 times a day Emphasize on fluid restriction Follow weights along with intake/output  Rest as above  [facility]

## 2016-08-26 DIAGNOSIS — N184 Chronic kidney disease, stage 4 (severe): Secondary | ICD-10-CM

## 2016-08-26 LAB — CBC
HEMATOCRIT: 33.5 % — AB (ref 39.0–52.0)
HEMOGLOBIN: 10.7 g/dL — AB (ref 13.0–17.0)
MCH: 28 pg (ref 26.0–34.0)
MCHC: 31.9 g/dL (ref 30.0–36.0)
MCV: 87.7 fL (ref 78.0–100.0)
Platelets: 469 10*3/uL — ABNORMAL HIGH (ref 150–400)
RBC: 3.82 MIL/uL — ABNORMAL LOW (ref 4.22–5.81)
RDW: 16.4 % — ABNORMAL HIGH (ref 11.5–15.5)
WBC: 11.5 10*3/uL — ABNORMAL HIGH (ref 4.0–10.5)

## 2016-08-26 LAB — COMPREHENSIVE METABOLIC PANEL
ALBUMIN: 1.3 g/dL — AB (ref 3.5–5.0)
ALT: 27 U/L (ref 17–63)
ANION GAP: 9 (ref 5–15)
AST: 33 U/L (ref 15–41)
Alkaline Phosphatase: 348 U/L — ABNORMAL HIGH (ref 38–126)
BILIRUBIN TOTAL: 1.2 mg/dL (ref 0.3–1.2)
BUN: 41 mg/dL — ABNORMAL HIGH (ref 6–20)
CO2: 26 mmol/L (ref 22–32)
Calcium: 8 mg/dL — ABNORMAL LOW (ref 8.9–10.3)
Chloride: 101 mmol/L (ref 101–111)
Creatinine, Ser: 2.19 mg/dL — ABNORMAL HIGH (ref 0.61–1.24)
GFR calc Af Amer: 42 mL/min — ABNORMAL LOW (ref >60)
GFR calc non Af Amer: 36 mL/min — ABNORMAL LOW (ref >60)
GLUCOSE: 203 mg/dL — AB (ref 65–99)
POTASSIUM: 4.1 mmol/L (ref 3.5–5.1)
SODIUM: 136 mmol/L (ref 135–145)
TOTAL PROTEIN: 6.7 g/dL (ref 6.5–8.1)

## 2016-08-26 LAB — MAGNESIUM: MAGNESIUM: 1.8 mg/dL (ref 1.7–2.4)

## 2016-08-26 LAB — GLUCOSE, CAPILLARY
GLUCOSE-CAPILLARY: 152 mg/dL — AB (ref 65–99)
Glucose-Capillary: 173 mg/dL — ABNORMAL HIGH (ref 65–99)
Glucose-Capillary: 142 mg/dL — ABNORMAL HIGH (ref 65–99)
Glucose-Capillary: 143 mg/dL — ABNORMAL HIGH (ref 65–99)

## 2016-08-26 MED ORDER — METOLAZONE 5 MG PO TABS: 5.0000 mg | ORAL_TABLET | Freq: Once | ORAL | Status: DC

## 2016-08-26 MED ORDER — MAGNESIUM SULFATE 2 GM/50ML IV SOLN
2.0000 g | Freq: Once | INTRAVENOUS | Status: AC
  Administered 2016-08-26: 2 g via INTRAVENOUS
  Filled 2016-08-26: qty 50

## 2016-08-26 MED ORDER — METOLAZONE 5 MG PO TABS
5.0000 mg | ORAL_TABLET | Freq: Every day | ORAL | Status: DC
  Administered 2016-08-26 – 2016-08-31 (×6): 5 mg via ORAL
  Filled 2016-08-26 (×6): qty 1

## 2016-08-26 MED ORDER — POTASSIUM CHLORIDE CRYS ER 20 MEQ PO TBCR
40.0000 meq | EXTENDED_RELEASE_TABLET | Freq: Every day | ORAL | Status: DC
  Administered 2016-08-27 – 2016-08-31 (×5): 40 meq via ORAL
  Filled 2016-08-26 (×5): qty 2

## 2016-08-26 MED ORDER — POTASSIUM CHLORIDE CRYS ER 20 MEQ PO TBCR
20.0000 meq | EXTENDED_RELEASE_TABLET | Freq: Once | ORAL | Status: AC
  Administered 2016-08-26: 20 meq via ORAL
  Filled 2016-08-26: qty 1

## 2016-08-26 MED ORDER — ISOSORBIDE MONONITRATE ER 60 MG PO TB24
60.0000 mg | ORAL_TABLET | Freq: Every day | ORAL | Status: DC
  Administered 2016-08-27 – 2016-09-01 (×6): 60 mg via ORAL
  Filled 2016-08-26 (×8): qty 1

## 2016-08-26 NOTE — Consult Note (Signed)
Advanced Heart Failure Team Consult Note  Referring Physician: Dr. Laurena Bering Primary Physician: No PCP  Primary Cardiologist:  Dr. Gwenlyn Found  Reason for Consultation: NICM, systolic CHF   HPI:    Joshua Massey is a 41 year old male with a past medical history of NICM(normal cors by cath in 2013), DM, HTN and chronic combined systolic and diastolic CHF (EF 64-68%). He was first diagnosed in 2010 when he followed with Dr. Rollene Fare.   Echo this admission shows an EF of 25-30%. Echo in 2014 shows an EF of 35-40%.  He was hospitalized in 01/2012 with volume overload and was started on milrinone. RHC/LHC done that admission, results below. He diuresed 66 pounds that admission. He then followed up in the CHF clinic after his hospitalization and eventually was lost to follow up in 2014. Since then he has been seeing Dr. Gwenlyn Found. He has been maintained on 25mg  Arlyce Harman, Imdur 30mg , 100mg  Cozaar, Lasix 40mg  daily, 25mg  Coreg daily, hydralazine 50mg  BID and digoxin 0.25mg . His medication compliance has always been an issue and frequently runs out of meds.   In 2014 creatinine was 1.04 but has no other lab work since that time.   He was admitted on 08/16/16 with increasing lower extremity edema and weight gain of about 50 pounds over the last month. BNP was 2062 on admission, CXR consistent with edema, creatinine 2.49. Admission weight was 365 pounds.  He was started on 60mg  IV Lasix BID, then increased to 120mg  BID on 08/20/16. Lasix was increased to 160mg  q 8 hours on 08/25/16. Weight down 17 pounds from admission, but still with pitting edema up to thighs. Creatinine has been elevated throughout admission 2.19->2.26->2.28->2.17->2.19.   He tells me that he has been intermittently compliant with his medications, he runs out of them and then doesn't take them for a few days. He does have insurance and says that he has no difficulty obtaining his medications. He has been eating out frequently, mostly at fast foods  places. Does not restrict his fluids, eats a high salt diet. Does not weight daily. 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 Does not get any regular exercise. Denies orthopnea, sleeps on one pillow, denies PND. Has diffuse scrotal edema and lower extremity edema.    02/16/12 RHC/LHC  RA - 28  RV - 61/29  PA - 63/18 (31)  FCO/CI - 4.28/1.59  TDCO/CI - 3.46/1.28  AO Sat - 94% (room air)  PA Sat - 49% (room air)  LVEDP 28  PVR = 0.8 [place]  Moderate tubular mid-LAD lesion, otherwise no significant obstructive CAD.  Echo 01/2012 EF <20% Echo 05/2012 EF 25% Echo 08/2012 EF 35-40% Echo 07/2016 EF 25-30%  Review of Systems: [y] = yes, [ ]  = no   General: Weight gain Blue.Reese ]; Weight loss [ ] ; Anorexia [ ] ; Fatigue [ ] ; Fever [ n]; Chills [n ]; Weakness [ ]   Cardiac: Chest pain/pressure [n ]; Resting SOB [ n]; Exertional SOB [n ]; Orthopnea [n ]; Pedal Edema Blue.Reese ]; Palpitations [ ] ; Syncope [ ] ; Presyncope [ ] ; Paroxysmal nocturnal dyspnea[n ]  Pulmonary: Cough [n ]; Wheezing[ ] ; Hemoptysis[ ] ; Sputum [ ] ; Snoring [ ]   GI: Vomiting[n ]; Dysphagia[ ] ; Melena[ ] ; Hematochezia [ ] ; Heartburn[ ] ; Abdominal pain [ ] ; Constipation [ ] ; Diarrhea [ ] ; BRBPR [ ]   GU: Hematuria[ n]; Dysuria [ ] ; Nocturia[ ]   Vascular: Pain in legs with walking [n ]; Pain  in feet with lying flat [ ] ; Non-healing sores [ ] ; Stroke [ ] ; TIA [ ] ; Slurred speech [ ] ;  Neuro: Headaches[ n]; Vertigo[ ] ; Seizures[ ] ; Paresthesias[ ] ;Blurred vision [ ] ; Diplopia [ ] ; Vision changes [ ]   Ortho/Skin: Arthritis [ ] ; Joint pain [ ] ; Muscle pain [ ] ; Joint swelling [ ] ; Back Pain [ ] ; Rash [ ]   Psych: Depression[n ]; Anxiety[ ]   Heme: Bleeding problems [ ] ; Clotting disorders [ ] ; Anemia [ ]   Endocrine: Diabetes [ ] ; Thyroid dysfunction[ ]   Home Medications Prior to Admission medications   Medication Sig Start Date End Date Taking? Authorizing Provider  acetaminophen (TYLENOL) 500 MG tablet Take 1,000  mg by mouth every 6 (six) hours as needed for moderate pain or headache.   Yes Historical Provider, MD  aspirin 81 MG tablet Take 81 mg by mouth daily.   Yes Historical Provider, MD  carvedilol (COREG) 25 MG tablet TAKE ONE TABLET BY MOUTH TWICE DAILY WITH  A  MEAL Patient taking differently: Take 25 mg by mouth 2 (two) times daily with a meal.  10/30/15  Yes Joshua Harp, MD  digoxin (LANOXIN) 0.25 MG tablet Take 1 tablet (250 mcg total) by mouth daily. 10/30/15  Yes Joshua Harp, MD  furosemide (LASIX) 40 MG tablet Take 1 tablet (40 mg total) by mouth 2 (two) times daily. 04/06/16  Yes Joshua Harp, MD  hydrALAZINE (APRESOLINE) 50 MG tablet Take 1 tablet (50 mg total) by mouth 2 (two) times daily. 10/30/15  Yes Joshua Harp, MD  isosorbide mononitrate (IMDUR) 30 MG 24 hr tablet Take 1 tablet (30 mg total) by mouth daily. 10/30/15  Yes Joshua Harp, MD  losartan (COZAAR) 100 MG tablet Take 1 tablet (100 mg total) by mouth daily. 10/30/15  Yes Joshua Harp, MD  spironolactone (ALDACTONE) 25 MG tablet Take 1 tablet (25 mg total) by mouth daily. 10/30/15  Yes Joshua Harp, MD  Insulin Glargine (LANTUS SOLOSTAR) 100 UNIT/ML Solostar Pen Inject 30 Units into the skin every morning. And pen needles 1/day Patient not taking: Reported on 08/17/2016 12/30/15   Renato Shin, MD  [facility] VERIO test strip USE DAILY Patient not taking: Reported on 08/17/2016 02/12/15   Renato Shin, MD  pantoprazole (PROTONIX) 40 MG tablet TAKE ONE TABLET BY MOUTH ONCE DAILY Patient not taking: Reported on 08/17/2016 12/21/15   Joshua Harp, MD   Current Scheduled Meds: . aspirin EC  81 mg Oral Daily  . carvedilol  25 mg Oral BID WC  . ferrous sulfate  325 mg Oral BID WC  . furosemide  160 mg Intravenous Q8H  . heparin  5,000 Units Subcutaneous Q8H  . hydrALAZINE  50 mg Oral BID  . insulin aspart  0-9 Units Subcutaneous TID WC  . insulin glargine  5 Units Subcutaneous QHS  . isosorbide mononitrate   60 mg Oral Daily  . living well with diabetes book   Does not apply Once  . metolazone  5 mg Oral Daily  . pantoprazole  40 mg Oral Daily  . potassium chloride  20 mEq Oral Daily   Continuous Infusions: PRN Meds:.hydrALAZINE, HYDROcodone-acetaminophen, ondansetron **OR** ondansetron (ZOFRAN) IV  Past Medical History: Past Medical History:  Diagnosis Date  . 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 Diabetes mellitus    adult-onset, type 2  . Exogenous obesity   . GERD (gastroesophageal reflux disease)   . Hypertension  Past Surgical History: Past Surgical History:  Procedure Laterality Date  . ANKLE SURGERY  2003   plate and screws  . CARDIAC CATHETERIZATION  02/16/2012   50% LAD lesion in mid vessel, otherwise no significant obstructive CAD  . HIP PINNING  1990   bilateral  . LEFT AND RIGHT HEART CATHETERIZATION WITH CORONARY ANGIOGRAM N/A 02/16/2012   Procedure: LEFT AND RIGHT HEART CATHETERIZATION WITH CORONARY ANGIOGRAM;  Surgeon: Pixie Casino, MD;  Location: [facility] CATH LAB;  Service: Cardiovascular;  Laterality: N/A;  . TRANSTHORACIC ECHOCARDIOGRAM  08/31/2012   EF 35-40%, no significant wall motion abnormalities, diastolic relaxation abnormality - no longer needed LifeVest; LV cavity size mod dilated with mod conc hypertrophy, systolic function mod reduced; mild MR, LA mod dilated    Family History: Family History  Problem Relation Age of Onset  . Coronary artery disease Brother     CABG at 27, DM  . Diabetes Mother   . Hypertension Mother   . Diabetes Father     Social History: Social History   Social History  . Marital status: Single    Spouse name: N/A  . Number of children: 6  . Years of education: 15   Occupational History  . 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 Smoking status: Never Smoker  . Smokeless tobacco: Never Used  . Alcohol use No  .  Drug use: No  . Sexual activity: Yes    Birth control/ protection: Condom   Other Topics Concern  . None   Social History Narrative  . None    Allergies:  No Known Allergies  Objective:    Vital Signs:   Temp:  [98.2 F (36.8 C)-98.8 F (37.1 C)] 98.2 F (36.8 C) (03/09 0355) Pulse Rate:  [80-81] 81 (03/09 0355) Resp:  [18] 18 (03/09 0355) BP: (114-138)/(60-82) 118/64 (03/09 0847) SpO2:  [91 %-98 %] 93 % (03/09 0355) Weight:  [348 lb 8.8 oz (158.1 kg)] 348 lb 8.8 oz (158.1 kg) (03/09 0355) Last BM Date: 08/25/16  Weight change: Filed Weights   08/24/16 0519 08/25/16 0539 08/26/16 0355  Weight: (!) 358 lb 7.5 oz (162.6 kg) (!) 355 lb 9.6 oz (161.3 kg) (!) 348 lb 8.8 oz (158.1 kg)    Intake/Output:   Intake/Output Summary (Last 24 hours) at 08/26/16 0848 Last data filed at 08/26/16 0524  Gross per 24 hour  Intake             1026 ml  Output             4015 ml  Net            -2989 ml     Physical Exam: General: African [place] male, obese in NAD.  HEENT: normal Neck: supple. Hard to assess JVP . Carotids 2+ bilat; no bruits. No lymphadenopathy or thyromegaly appreciated. Cor: PMI nondisplaced. Regular rate & rhythm. No rubs, gallops or murmurs. Lungs: Diminished throughout.  Abdomen: soft, nontender, nondistended, Obese.  No hepatosplenomegaly. No bruits or masses. Good bowel sounds. Extremities: no cyanosis, clubbing, rash. 3+ edema bilaterally all the way up to his thighs. Extremities warm  Neuro: alert & orientedx3, cranial nerves grossly intact. moves all 4 extremities w/o difficulty. Affect pleasant  Telemetry: NSR.  EKG: NSR with low voltage in anterior leads.   Labs: Basic Metabolic Panel:  Recent Labs Lab 08/21/16 0310 08/22/16 0147 08/23/16 9371  08/25/16 0454 08/26/16 0326  NA 137 136 136 136 136  K 3.9 3.9 4.0 4.2 4.1  CL 106 105 104 100* 101  CO2 24 26 26 26 26   GLUCOSE 185* 168* 177* 162* 203*  BUN 50* 47* 46* 44* 41*  CREATININE  2.19* 2.26* 2.28* 2.17* 2.19*  CALCIUM 7.6* 7.6* 7.7* 8.1* 8.0*  MG  --   --   --  1.8 1.8    Liver Function Tests:  Recent Labs Lab 08/26/16 0326  AST 33  ALT 27  ALKPHOS 348*  BILITOT 1.2  PROT 6.7  ALBUMIN 1.3*    CBC:  Recent Labs Lab 08/26/16 0326  WBC 11.5*  HGB 10.7*  HCT 33.5*  MCV 87.7  PLT 469*     BNP: BNP (last 3 results)  Recent Labs  08/16/16 2055  BNP 2,062.6*    CBG:  Recent Labs Lab 08/25/16 0624 08/25/16 1126 08/25/16 1630 08/25/16 2028 08/26/16 0632  GLUCAP 152* 152* 193* 162* 173*     Other results: EKG: NSR, low voltage QRS   Transthoracic Echocardiography 08/17/16 Study Conclusions  - Procedure narrative: Transthoracic echocardiography. Image   quality was adequate. Intravenous contrast (Definity) was   administered. - Left ventricle: The cavity size was normal. Wall thickness was   increased in a pattern of mild LVH. Systolic function was   severely reduced. The estimated ejection fraction was in the   range of 25% to 30%. No apical thrombus with Defininty contrast.   Diffuse hypokinesis. The study is not technically sufficient to   allow evaluation of LV diastolic function. - Ventricular septum: Septal motion showed paradox. The contour   showed diastolic flattening and systolic flattening. - Mitral valve: Mildly thickened leaflets . There was trivial   regurgitation. - Left atrium: Moderately dilated. - Right ventricle: Poorly visualized. The cavity size was   moderately dilated. - Right atrium: Moderately dilated. - Pulmonic valve: There was mild regurgitation. - Pulmonary arteries: PA peak pressure: 56 mm Hg (S). - Inferior vena cava: The vessel was dilated. The respirophasic   diameter changes were blunted (< 50%), consistent with elevated   central venous pressure. - Pericardium, extracardiac: Cannot exclude tamponade physiology -   clinical correlation is advised.  Impressions:  - Compared to a prior  study in 2014, the LVEF is lower at 25-30%.   There is now a moderate size pericardial effusion - tamponade   physiology cannot be excluded, especially in the setting of   moderate pulmonary hypertension with signs of volume and pressure   overload of the RV.   Medications:     Current Medications: . aspirin EC  81 mg Oral Daily  . carvedilol  25 mg Oral BID WC  . ferrous sulfate  325 mg Oral BID WC  . furosemide  160 mg Intravenous Q8H  . heparin  5,000 Units Subcutaneous Q8H  . hydrALAZINE  50 mg Oral BID  . insulin aspart  0-9 Units Subcutaneous TID WC  . insulin glargine  5 Units Subcutaneous QHS  . isosorbide mononitrate  30 mg Oral Daily  . living well with diabetes book   Does not apply Once  . pantoprazole  40 mg Oral Daily  . potassium chloride  20 mEq Oral Daily      Assessment/Plan   1. Acute on chronic combined systolic and diastolic CHF: NYHA IIB,  EF 25-30% this admission, presented with volume overload. Currently on IV Lasix 160mg  q 8 hours. Weight down 17  pounds from admission, but remains markedly volume overloaded. On physical exam he appears warm and wet. He has 3+ pitting edema up to his thighs.  - NICM - had normal cors in 2015, LVH on Echo this admission, but cavity size normal.  - Continue high dose Lasix 160mg  IV q 8 hours.  - Add 5mg  metolazone today.  - Increase Imdur to 60mg  daily.  - Increase hydralazine to 50mg  TID.  - Continue Coreg 25mg  BID.  - Will not be able to use Dig or Entresto with elevated creatinine.  2. Acute on chronic kidney disease: of note, in 2014 Creatinine was 1.04, on admit creatinine is 2.49. Renal ultrasound on 2/28 without evidence of hydronephrosis.  - Creatinine is 2.19 today. Improved from admission creatinine which was 2.49. BUN is 41 however. Will follow with daily BMET.   - No ACE or ARB currently, avoiding nephrotoxic agents.  3. Uncontrolled DM type 2: A1c is 11.2. Discussed diet modifications. On sliding scale here.  Needs PCP to manage.  4. HTN: Increase hydralazine as above.  5. Noncompliance: Discussed with patient the importance of adhering to his medication regimen.  6. Morbid Obesity Body mass index is 48.61 kg/m.  7. Moderate sized pericardial effusion: Had moderate pericardial effusion by Echo, no tamponade symptoms currently. Did have low voltage on EKG which was new when compared to prior EKG's.  Plan to refer to paramedicine.   Length of Stay: [place], NP  08/26/2016, 8:48 AM  Advanced Heart Failure Team Pager [phone] (M-F; 7a - 4p)  Please contact [facility] for night-coverage after hours (4p -7a ) and weekends on [email or web address]  Patient seen with NP, agree with the above note.    Patient is markedly volume overloaded on exam.  This has built up over a long period of time.  He has not been following up regularly with cardiology.   1. Acute on chronic systolic CHF: Echo with EF 25-30%, nonischemic cardiomyopathy.  On exam, he is markedly volume overloaded.  He is on a high dose of Lasix and has been diuresing.   - Continue current IV Lasix and add metolazone 5 mg daily. Replace K.  - Increase hydralazine to 50 mg tid and Imdur 60 mg daily.  - Continue current Coreg for now.  - If creatinine remains stable, will add spironolactone.  2. Elevated creatinine: No recent prior.  Suspect CKD rather than AKI.  Creatinine stable so far with diuresis.  Watch closely.  3. Pericardial effusion: Moderate in size.  No tamponade.   Loralie Champagne 08/26/2016 4:34 PM

## 2016-08-26 NOTE — Progress Notes (Signed)
PROGRESS NOTE    [name]  FWY:[id number] DOB: 1976/03/15 DOA: 08/16/2016 PCP: Pcp Not In System   Brief Narrative:  Joshua Massey is a 41 year old male with history of nonischemic cardiomyopathy, last EF 35-40% in 2014, who presentedto the ER for evaluation of massive anasarca. He unfortunately was noncompliant to diuretics.Admitted and started on high-dose Lasix.   Assessment & Plan: 1. Massive anasarca due to acute on chronic systolic heart failure and diabetic nephropathy: Fluid overload is improving, but remains with significant volume on board. Weights continue to improve and I/O ballance remains negative.  Continue Lasix 160 mg 3 times a day. RN to continue ensuring that patient is compliant with fluid restriction. Continue aspirin, carvedilol, isosorbide, hydralazine, noACEIdue to AKI. CHF team now following.    2. Pericardial effusion: No sign of tamponade. Continue diuresis.   3. Acute renal failure: Not sure if this is due to cardiorenal syndrome, adoes appear to have significant amount of proteinuria-which is likely due to diabetic nephropathy. Creatinine and electrolytes stable on high-dose Lasix. Continue to monitor. Will likely need to follow with a nephrologist outpatient.   4. Essential hypertension: Stable. Continuecarvedilol, hydralazine and isosorbide.   5. Uncontrolled DM Type 2: Continue SSI and Lantus.  6. Anemia of iron deficiency:Continue Iron supplement.   7. Left lower extremity wound:Continue local wound care. Continue tylenol or norco for pain.  DVT prophylaxis: Heparin Code Status: Full Family Communication: No family at bedside Disposition Plan: home when ready-still requires several days of hospitalization   Consultants:   Cardiology  Procedures:  Echocardiogram ? Study Conclusions - Left ventricle: The cavity size was normal. Wall thickness was increased in a pattern of mild LVH. Systolic  function was severely reduced. The estimated ejection fraction was in the range of 25% to 30%. No apical thrombus with Defininty contrast. Diffuse hypokinesis. The study is not technically sufficient to allow evaluation of LV diastolic function. - Ventricular septum: Septal motion showed paradox. The contour showed diastolic flattening and systolic flattening. - Mitral valve: Mildly thickened leaflets . There was trivial regurgitation. - Left atrium: Moderately dilated. - Right ventricle: Poorly visualized. The cavity size was moderately dilated. - Right atrium: Moderately dilated. - Pulmonic valve: There was mild regurgitation. - Pulmonary arteries: PA peak pressure: 56 mm Hg (S). - Inferior vena cava: The vessel was dilated. The respirophasic diameter changes were blunted (<50%), consistent with elevated central venous pressure. - Pericardium, extracardiac: Cannot exclude tamponade physiology - clinical correlation is advised. - Moderate size pericardial effusion   Antimicrobials:   None   Subjective: Patient is making a lot of urine and notes that his swelling is improving. He denies shortness of breath, chest pain, nausea, and vomiting.   Objective: Vitals:   08/25/16 1739 08/25/16 1931 08/26/16 0355 08/26/16 0847  BP: 122/67 129/60 138/82 118/64  Pulse: 81 80 81 81  Resp:  18 18 18   Temp:  98.7 F (37.1 C) 98.2 F (36.8 C) 98.7 F (37.1 C)  TempSrc:  Oral Oral Oral  SpO2:  98% 93% 100%  Weight:   (!) 158.1 kg (348 lb 8.8 oz)   Height:        Intake/Output Summary (Last 24 hours) at 08/26/16 0934 Last data filed at 08/26/16 0851  Gross per 24 hour  Intake             1443 ml  Output             4715 ml  Net             -  3272 ml   Filed Weights   08/24/16 0519 08/25/16 0539 08/26/16 0355  Weight: (!) 162.6 kg (358 lb 7.5 oz) (!) 161.3 kg (355 lb 9.6 oz) (!) 158.1 kg (348 lb 8.8 oz)    Examination:  General exam: Appears calm and comfortable. Deconditioned.  Respiratory system: Clear to auscultation. Respiratory effort normal. Cardiovascular system: S1 & S2 heard, RRR. No JVD, murmurs, rubs, gallops or clicks. 4+ edema. Gastrointestinal system: Abdomen is nondistended, soft and nontender.  Central nervous system: Alert and oriented. No focal neurological deficits. Extremities: Symmetric 5 x 5 power. Skin: Bandage on left lower extremity.  Psychiatry: Judgement and insight appear normal. Mood & affect appropriate.   Data Reviewed: I have personally reviewed following labs and imaging studies  CBC:  Recent Labs Lab 08/26/16 0326  WBC 11.5*  HGB 10.7*  HCT 33.5*  MCV 87.7  PLT 845*   Basic Metabolic Panel:  Recent Labs Lab 08/21/16 0310 08/22/16 0147 08/23/16 0227 08/25/16 0454 08/26/16 0326  NA 137 136 136 136 136  K 3.9 3.9 4.0 4.2 4.1  CL 106 105 104 100* 101  CO2 24 26 26 26 26   GLUCOSE 185* 168* 177* 162* 203*  BUN 50* 47* 46* 44* 41*  CREATININE 2.19* 2.26* 2.28* 2.17* 2.19*  CALCIUM 7.6* 7.6* 7.7* 8.1* 8.0*  MG  --   --   --  1.8 1.8   GFR: Estimated Creatinine Clearance: 69.4 mL/min (by C-G formula based on SCr of 2.19 mg/dL (H)). Liver Function Tests:  Recent Labs Lab 08/26/16 0326  AST 33  ALT 27  ALKPHOS 348*  BILITOT 1.2  PROT 6.7  ALBUMIN 1.3*   No results for input(s): LIPASE, AMYLASE in the last 168 hours. No results for input(s): AMMONIA in the last 168 hours. Coagulation Profile: No results for input(s): INR, PROTIME in the last 168 hours. Cardiac Enzymes: No results for input(s): CKTOTAL, CKMB, CKMBINDEX, TROPONINI in the last 168 hours. BNP (last 3 results) No results for input(s): PROBNP in the last 8760 hours. HbA1C: No results for input(s):  HGBA1C in the last 72 hours. CBG:  Recent Labs Lab 08/25/16 0624 08/25/16 1126 08/25/16 1630 08/25/16 2028 08/26/16 0632  GLUCAP 152* 152* 193* 162* 173*   Lipid Profile: No results for input(s): CHOL, HDL, LDLCALC, TRIG, CHOLHDL, LDLDIRECT in the last 72 hours. Thyroid Function Tests: No results for input(s): TSH, T4TOTAL, FREET4, T3FREE, THYROIDAB in the last 72 hours. Anemia Panel: No results for input(s): VITAMINB12, FOLATE, FERRITIN, TIBC, IRON, RETICCTPCT in the last 72 hours. Urine analysis:    Component Value Date/Time   COLORURINE AMBER (A) 08/17/2016 0129   APPEARANCEUR HAZY (A) 08/17/2016 0129   LABSPEC 1.016 08/17/2016 0129   PHURINE 5.0 08/17/2016 0129   GLUCOSEU 50 (A) 08/17/2016 0129   HGBUR SMALL (A) 08/17/2016 0129   BILIRUBINUR NEGATIVE 08/17/2016 0129   KETONESUR NEGATIVE 08/17/2016 0129   PROTEINUR >=300 (A) 08/17/2016 0129   UROBILINOGEN 0.2 09/02/2009 0935   NITRITE NEGATIVE 08/17/2016 0129   LEUKOCYTESUR NEGATIVE 08/17/2016 0129   Sepsis Labs: @LABRCNTIP (procalcitonin:4,lacticidven:4)  )No results found for this or any previous visit (from the past 240 hour(s)).       Radiology Studies: No results found.   Scheduled Meds: . aspirin EC  81 mg Oral Daily  . carvedilol  25 mg Oral BID WC  . ferrous sulfate  325 mg Oral BID WC  . furosemide  160 mg Intravenous Q8H  . heparin  5,000 Units Subcutaneous Q8H  .  hydrALAZINE  50 mg Oral BID  . insulin aspart  0-9 Units Subcutaneous TID WC  . insulin glargine  5 Units Subcutaneous QHS  . isosorbide mononitrate  30 mg Oral Daily  . living well with diabetes book   Does not apply Once  . pantoprazole  40 mg Oral Daily  . potassium chloride  20 mEq Oral Daily   Continuous Infusions:   LOS: 9 days    Aliene Altes, PA-S [facility] 08/26/2016   If 7PM-7AM, please contact night-coverage [email or web address] Password [facility] 08/26/2016, 9:34 AM   Attending MD note Patient was seen,  examined,treatment plan was discussed with the PA-S.  I have personally reviewed the clinical findings, lab, imaging studies and management of this patient in detail. I agree with the documentation, as recorded by the PA-S.   Patient Continues to do well-no major complaints. Still has significant volume on board.  On Exam: Gen. exam: Awake, alert, not in any distress Chest: Good air entry bilaterally, no rhonchi or rales CVS: S1-S2 regular, no murmurs Abdomen: Soft, nontender and nondistended Neurology: Non-focal Skin: No rash or lesions  Impression:  Massive anasarca due to acute on chronic systolic heart failure and diabetic nephropathy Acute kidney injury-probably cardiorenal syndrome Hypertension Insulin-dependent diabetes  Plan: Continue Lasix-follow weights, intake/output CHF team consulted  Rest as above  [facility]

## 2016-08-26 NOTE — Progress Notes (Signed)
Physical Therapy Treatment Patient Details Name: Joshua Massey MRN: [id number] DOB: 27-Jul-1975 Today's Date: 08/26/2016    History of Present Illness 41 yo male with nonischemic cardiomyopthy, EF 35 to 40%, presents with worsening dyspnea and lower extremity edema, CHF exacerbation and pericardial effusion. PMHx: DM, HTN, obesity    PT Comments    With encouragement, the pt was willing to ambulate during PT session. Pt able to go 150 ft using rw and min guard assistance. Distance limited by c/o LLE pain and fatigue. Anticipating that the pt will continue to progress with his mobility and d/c to home with support. PT will continue to follow to progress mobility and endurance.    Follow Up Recommendations  No PT follow up     Equipment Recommendations  None recommended by PT (has rw in room)    Recommendations for Other Services       Precautions / Restrictions Precautions Precautions: Fall Restrictions Weight Bearing Restrictions: No    Mobility  Bed Mobility Overal bed mobility: Needs Assistance Bed Mobility: Supine to Sit;Sit to Supine     Supine to sit: Supervision;HOB elevated (using rail to assist) Sit to supine: Min assist (with LLE)   General bed mobility comments: Pt able to scoot self up in bed with rails.  Transfers Overall transfer level: Needs assistance Equipment used: Rolling walker (2 wheeled) Transfers: Sit to/from Stand Sit to Stand: Min guard;From elevated surface         General transfer comment: Repeating X3  Ambulation/Gait Ambulation/Gait assistance: Min guard Ambulation Distance (Feet): 150 Feet Assistive device: Rolling walker (2 wheeled) Gait Pattern/deviations: Wide base of support;Step-through pattern;Trunk flexed Gait velocity: decreased   General Gait Details: Cues for posture, unable to place LEs within rw despite using wide rw. Consistent encouragement needed for pt to attempt ambulation   Stairs             Wheelchair Mobility    Modified Rankin (Stroke Patients Only)       Balance Overall balance assessment: Needs assistance Sitting-balance support: No upper extremity supported Sitting balance-Leahy Scale: Good     Standing balance support: During functional activity Standing balance-Leahy Scale: Fair Standing balance comment: able to stand static without UE support                    Cognition Arousal/Alertness: Awake/alert Behavior During Therapy: WFL for tasks assessed/performed Overall Cognitive Status: Within Functional Limits for tasks assessed                      Exercises      General Comments General comments (skin integrity, edema, etc.): Initially pt unwilling to attempt ambulation but after several stands pt willing to attempt.       Pertinent Vitals/Pain Pain Assessment: 0-10 Pain Score: 7  Pain Location: L LE Pain Descriptors / Indicators: Cramping Pain Intervention(s): Monitored during session;Limited activity within patient's tolerance (Returned for second attempt to see pt after pain meds)    Home Living                      Prior Function            PT Goals (current goals can now be found in the care plan section) Acute Rehab PT Goals Patient Stated Goal: get this fluid off PT Goal Formulation: With patient Time For Goal Achievement: 08/31/16 Potential to Achieve Goals: Good Progress towards PT goals: Progressing toward goals  Frequency    Min 3X/week      PT Plan Current plan remains appropriate    Co-evaluation             End of Session   Activity Tolerance: Patient limited by fatigue;Patient limited by pain Patient left: in bed;with call bell/phone within reach Nurse Communication: Mobility status PT Visit Diagnosis: Unsteadiness on feet (R26.81);Muscle weakness (generalized) (M62.81);Difficulty in walking, not elsewhere classified (R26.2);Pain Pain - Right/Left: Left Pain - part of body:  Leg     Time: 0165-8006 PT Time Calculation (min) (ACUTE ONLY): 26 min  Charges:  $Gait Training: 8-22 mins $Therapeutic Activity: 8-22 mins                    G Codes:       Cassell Clement, PT, CSCS Pager [phone] Office [phone]  08/26/2016, 3:19 PM

## 2016-08-27 LAB — BASIC METABOLIC PANEL
Anion gap: 10 (ref 5–15)
BUN: 42 mg/dL — AB (ref 6–20)
CHLORIDE: 97 mmol/L — AB (ref 101–111)
CO2: 27 mmol/L (ref 22–32)
CREATININE: 2.1 mg/dL — AB (ref 0.61–1.24)
Calcium: 8.2 mg/dL — ABNORMAL LOW (ref 8.9–10.3)
GFR calc Af Amer: 44 mL/min — ABNORMAL LOW (ref >60)
GFR calc non Af Amer: 38 mL/min — ABNORMAL LOW (ref >60)
GLUCOSE: 170 mg/dL — AB (ref 65–99)
Potassium: 3.8 mmol/L (ref 3.5–5.1)
SODIUM: 134 mmol/L — AB (ref 135–145)

## 2016-08-27 LAB — CBC
HCT: 33 % — ABNORMAL LOW (ref 39.0–52.0)
Hemoglobin: 10.8 g/dL — ABNORMAL LOW (ref 13.0–17.0)
MCH: 28.6 pg (ref 26.0–34.0)
MCHC: 32.7 g/dL (ref 30.0–36.0)
MCV: 87.3 fL (ref 78.0–100.0)
PLATELETS: 467 10*3/uL — AB (ref 150–400)
RBC: 3.78 MIL/uL — ABNORMAL LOW (ref 4.22–5.81)
RDW: 16.1 % — AB (ref 11.5–15.5)
WBC: 11 10*3/uL — AB (ref 4.0–10.5)

## 2016-08-27 LAB — GLUCOSE, CAPILLARY
GLUCOSE-CAPILLARY: 151 mg/dL — AB (ref 65–99)
Glucose-Capillary: 159 mg/dL — ABNORMAL HIGH (ref 65–99)
Glucose-Capillary: 147 mg/dL — ABNORMAL HIGH (ref 65–99)
Glucose-Capillary: 150 mg/dL — ABNORMAL HIGH (ref 65–99)

## 2016-08-27 NOTE — Progress Notes (Signed)
PROGRESS NOTE        PATIENT DETAILS Name: Joshua Massey Age: 41 y.o. Sex: male Date of Birth: [birth year] Admit Date: 08/16/2016 Admitting Physician Rise Patience, MD PCP:Pcp Not In System  Brief Narrative: Joshua Massey is a 41 year old male with history of nonischemic cardiomyopathy, last EF 35-40% in 2014, who presentedto the ERfor evaluation of massive anasarca. He unfortunately was noncompliant to diuretics.Admitted and started on high-dose Lasix.  Subjective: No chest pain or SOB. Lying comfortably in bed.  Assessment/Plan: Massive anasarca due to acute on chronic systolic heart failureand diabetic nephropathy:Fluid overload is improving, but remains with significant volume on board. Weights continue to improve and I/O ballance remains negative.  Continue Lasix 160 mg 3 times a day and Metolozone.  Continue aspirin, carvedilol, isosorbide, hydralazine, noACEIdue to AKI. CHF team now following.    Pericardial effusion:No sign of tamponade. Continue diuresis.   Acute renal failure:Not sure if this is due to cardiorenal syndrome, adoes appear to have significant amount of proteinuria-which is likely due to diabetic nephropathy. Creatinine and electrolytes stable on high-dose Lasix.Continue to monitor. Will likely need to follow with a nephrologist outpatient.   Essential hypertension:Stable. Continuecarvedilol, hydralazine and isosorbide.   Insulin dependent DM Type 2:CBG's stbale with SSI and Lantus 5  units.  Anemia of iron deficiency:Continue Iron supplement.   Left lower extremity wound:Continue local wound care. Continue tylenol or norco for pain.  DVT Prophylaxis: Prophylactic Heparin   Code Status: Full code   Family Communication: None at bedside  Disposition Plan: Remain inpatient-requires several more days of hospitalization  Antimicrobial agents: Anti-infectives    Start     Dose/Rate Route Frequency  Ordered Stop   08/17/16 1000  doxycycline (VIBRAMYCIN) 100 mg in dextrose 5 % 250 mL IVPB  Status:  Discontinued     100 mg 125 mL/hr over 120 Minutes Intravenous Every 12 hours 08/17/16 0235 08/17/16 1307   08/17/16 0015  doxycycline (VIBRA-TABS) tablet 100 mg     100 mg Oral  Once 08/17/16 0002 08/17/16 0107      Procedures: Echocardiogram  - Left ventricle: The cavity size was normal. Wall thickness was increased in a pattern of mild LVH. Systolic function was severely reduced. The estimated ejection fraction was in the range of 25% to 30%. No apical thrombus with Defininty contrast. Diffuse hypokinesis. The study is not technically sufficient to allow evaluation of LV diastolic function. - Ventricular septum: Septal motion showed paradox. The contour showed diastolic flattening and systolic flattening. - Mitral valve: Mildly thickened leaflets . There was trivial regurgitation. - Left atrium: Moderately dilated. - Right ventricle: Poorly visualized. The cavity size was moderately dilated. - Right atrium: Moderately dilated. - Pulmonic valve: There was mild regurgitation. - Pulmonary arteries: PA peak pressure: 56 mm Hg (S). - Inferior vena cava: The vessel was dilated. The respirophasic diameter changes were blunted (<50%), consistent with elevated central venous pressure. - Pericardium, extracardiac: Cannot exclude tamponade physiology - clinical correlation is advised. - Moderate size pericardial effusion    CONSULTS:  cardiology  Time spent: 25- minutes-Greater than 50% of this time was spent in counseling, explanation of diagnosis, planning of further management, and coordination of  care.  MEDICATIONS: Scheduled Meds: . aspirin EC  81 mg Oral Daily  . carvedilol  25 mg Oral BID WC  . ferrous sulfate  325 mg Oral BID WC  . furosemide  160 mg Intravenous Q8H  . heparin  5,000 Units Subcutaneous Q8H  . hydrALAZINE  50 mg Oral BID  . insulin aspart  0-9 Units Subcutaneous TID WC  . insulin glargine  5 Units Subcutaneous QHS  . isosorbide mononitrate  60 mg Oral Daily  . living well with diabetes book   Does not apply Once  . metolazone  5 mg Oral Daily  . pantoprazole  40 mg Oral Daily  . potassium chloride  40 mEq Oral Daily   Continuous Infusions: PRN Meds:.hydrALAZINE, HYDROcodone-acetaminophen, ondansetron **OR** ondansetron (ZOFRAN) IV   PHYSICAL EXAM: Vital signs: Vitals:   08/26/16 1657 08/26/16 2117 08/27/16 0447 08/27/16 0957  BP: 138/88 (!) 133/95 126/81 125/82  Pulse: 84 84 81 75  Resp:  18 18 18   Temp:  98.5 F (36.9 C) 97.6 F (36.4 C)   TempSrc:  Oral Oral   SpO2:  97% 98% 99%  Weight:   (!) 153.5 kg (338 lb 8 oz)   Height:       Filed Weights   08/25/16 0539 08/26/16 0355 08/27/16 0447  Weight: (!) 161.3 kg (355 lb 9.6 oz) (!) 158.1 kg (348 lb 8.8 oz) (!) 153.5 kg (338 lb 8 oz)   Body mass index is 47.21 kg/m.   General appearance :Awake, alert, not in any distress. Speech Clear.  Eyes:, pupils equally reactive to light and accomodation,no scleral icterus. HEENT: Atraumatic and Normocephalic Neck: supple, no JVD. No cervical lymphadenopathy. No thyromegaly Resp:Good air entry bilaterally, no added sounds  CVS: S1 S2 regular, no murmurs.  GI: Bowel sounds present, Non tender and not distended with no gaurding, rigidity or rebound. Extremities: B/L Lower Ext shows +++ edema, both legs are warm to touch Neurology:  speech clear,Non focal, sensation is grossly intact. Psychiatric: Normal judgment and insight. Alert and oriented x 3.  Musculoskeletal:No digital cyanosis   I have personally reviewed following labs and imaging  studies  LABORATORY DATA: CBC:  Recent Labs Lab 08/26/16 0326 08/27/16 0319  WBC 11.5* 11.0*  HGB 10.7* 10.8*  HCT 33.5* 33.0*  MCV 87.7 87.3  PLT 469* 467*    Basic Metabolic Panel:  Recent Labs Lab 08/22/16 0147 08/23/16 0227 08/25/16 0454 08/26/16 0326 08/27/16 0319  NA 136 136 136 136 134*  K 3.9 4.0 4.2 4.1 3.8  CL 105 104 100* 101 97*  CO2 26 26 26 26 27   GLUCOSE 168* 177* 162* 203* 170*  BUN 47* 46* 44* 41* 42*  CREATININE 2.26* 2.28* 2.17* 2.19* 2.10*  CALCIUM 7.6* 7.7* 8.1* 8.0* 8.2*  MG  --   --  1.8 1.8  --     GFR: Estimated Creatinine Clearance: 71.2 mL/min (by C-G formula based on SCr of 2.1 mg/dL (H)).  Liver Function Tests:  Recent Labs Lab 08/26/16 0326  AST 33  ALT 27  ALKPHOS 348*  BILITOT 1.2  PROT 6.7  ALBUMIN 1.3*   No results for input(s): LIPASE, AMYLASE in the last 168 hours. No results for input(s): AMMONIA in the last 168 hours.  Coagulation Profile: No results for input(s): INR, PROTIME in the last 168 hours.  Cardiac Enzymes: No results for input(s): CKTOTAL, CKMB, CKMBINDEX, TROPONINI in the last 168 hours.  BNP (last 3 results) No results for input(s): PROBNP in the last 8760 hours.  HbA1C: No results for input(s): HGBA1C in the last 72 hours.  CBG:  Recent Labs Lab 08/26/16 [phone] 08/26/16 1112  08/26/16 1644 08/26/16 2120 08/27/16 0646  GLUCAP 173* 152* 142* 143* 159*    Lipid Profile: No results for input(s): CHOL, HDL, LDLCALC, TRIG, CHOLHDL, LDLDIRECT in the last 72 hours.  Thyroid Function Tests: No results for input(s): TSH, T4TOTAL, FREET4, T3FREE, THYROIDAB in the last 72 hours.  Anemia Panel: No results for input(s): VITAMINB12, FOLATE, FERRITIN, TIBC, IRON, RETICCTPCT in the last 72 hours.  Urine analysis:    Component Value Date/Time   COLORURINE AMBER (A) 08/17/2016 0129   APPEARANCEUR HAZY (A) 08/17/2016 0129   LABSPEC 1.016 08/17/2016 0129   PHURINE 5.0 08/17/2016 0129   GLUCOSEU  50 (A) 08/17/2016 0129   HGBUR SMALL (A) 08/17/2016 0129   BILIRUBINUR NEGATIVE 08/17/2016 0129   KETONESUR NEGATIVE 08/17/2016 0129   PROTEINUR >=300 (A) 08/17/2016 0129   UROBILINOGEN 0.2 09/02/2009 0935   NITRITE NEGATIVE 08/17/2016 0129   LEUKOCYTESUR NEGATIVE 08/17/2016 0129    Sepsis Labs: Lactic Acid, Venous No results found for: LATICACIDVEN  MICROBIOLOGY: No results found for this or any previous visit (from the past 240 hour(s)).  RADIOLOGY STUDIES/RESULTS: Dg Chest 2 View  Result Date: 08/16/2016 CLINICAL DATA:  Shortness of breath tonight. EXAM: CHEST  2 VIEW COMPARISON:  02/14/2012 FINDINGS: Moderate-to-marked cardiomegaly which is stable. Central vascular congestion and pulmonary edema. Probable left pleural effusion and minimal fluid in the fissures. No pneumothorax or confluent airspace disease. IMPRESSION: Findings consistent with CHF. Electronically Signed   By: Jeb Levering M.D.   On: 08/16/2016 22:01   US Renal  Result Date: 08/17/2016 CLINICAL DATA:  Acute onset of renal insufficiency. Initial encounter. EXAM: RENAL / URINARY TRACT ULTRASOUND COMPLETE COMPARISON:  CT of the abdomen and pelvis from 09/02/2009 FINDINGS: Right Kidney: Length: 12.6 cm. Increased parenchymal echogenicity noted. No mass or hydronephrosis visualized. Left Kidney: Length: 11.6 cm. Increased parenchymal echogenicity noted. No mass or hydronephrosis visualized. Difficult to fully assess due to overlying structures. Bladder: Appears normal for degree of bladder distention. IMPRESSION: 1. No evidence of hydronephrosis. 2. Increased renal parenchymal echogenicity may reflect medical renal disease. Electronically Signed   By: Garald Balding M.D.   On: 08/17/2016 06:39     LOS: 10 days   Oren Binet, MD  [facility] Pager:[phone]  If 7PM-7AM, please contact night-coverage [email or web address] Password [facility] 08/27/2016, 10:57 AM

## 2016-08-28 LAB — BASIC METABOLIC PANEL
ANION GAP: 8 (ref 5–15)
BUN: 43 mg/dL — AB (ref 6–20)
CO2: 29 mmol/L (ref 22–32)
Calcium: 8.3 mg/dL — ABNORMAL LOW (ref 8.9–10.3)
Chloride: 96 mmol/L — ABNORMAL LOW (ref 101–111)
Creatinine, Ser: 2.2 mg/dL — ABNORMAL HIGH (ref 0.61–1.24)
GFR calc Af Amer: 42 mL/min — ABNORMAL LOW (ref >60)
GFR calc non Af Amer: 36 mL/min — ABNORMAL LOW (ref >60)
GLUCOSE: 179 mg/dL — AB (ref 65–99)
POTASSIUM: 4 mmol/L (ref 3.5–5.1)
Sodium: 133 mmol/L — ABNORMAL LOW (ref 135–145)

## 2016-08-28 LAB — GLUCOSE, CAPILLARY
GLUCOSE-CAPILLARY: 176 mg/dL — AB (ref 65–99)
GLUCOSE-CAPILLARY: 148 mg/dL — AB (ref 65–99)
GLUCOSE-CAPILLARY: 150 mg/dL — AB (ref 65–99)
Glucose-Capillary: 165 mg/dL — ABNORMAL HIGH (ref 65–99)

## 2016-08-28 LAB — MAGNESIUM: Magnesium: 2 mg/dL (ref 1.7–2.4)

## 2016-08-28 NOTE — Progress Notes (Signed)
PROGRESS NOTE        PATIENT DETAILS Name: Joshua Massey Age: 41 y.o. Sex: male Date of Birth: 1975/07/17 Admit Date: 08/16/2016 Admitting Physician Rise Patience, MD PCP:Pcp Not In System  Brief Narrative: Joshua Massey is a 41 year old male with history of nonischemic cardiomyopathy, last EF 35-40% in 2014, who presentedto the ERfor evaluation of massive anasarca. He unfortunately was noncompliant to diuretics.Admitted and started on high-dose Lasix.  Subjective: No chest pain or SOB-diuresing well  Assessment/Plan: Massive anasarca due to acute on chronic systolic heart failureand diabetic nephropathy:Volume status slowly improving, still has edema upto his thighs. -17.9 L so far, weight has decreased to 332 pounds (365 pounds on admission).Per patient, his baseline weight is around 300 pounds.Continue high-dose Lasix and metolazone-follow electrolytes, weights and intake/output. Continue aspirin, carvedilol, isosorbide, hydralazine, noACEIdue to AKI. CHF team now following.    Pericardial effusion:No sign of tamponade. Continue diuresis.   Acute renal failure:Not sure if this is due to cardiorenal syndrome, does appear to have significant amount of proteinuria-which is likely due to diabetic nephropathy. Creatinine and electrolytes stable on high-dose Lasix.Continue to monitor. Will likely need to follow with a nephrologist outpatient.   Essential hypertension:Stable. Continuecarvedilol, hydralazine and isosorbide.   Insulin dependent DM Type 2:CBG's stbale with SSI and Lantus 5  units.  Anemia of iron deficiency:Continue Iron supplement.   Left lower extremity wound:Continue local wound care. Continue tylenol or norco for pain.  DVT Prophylaxis: Prophylactic Heparin   Code Status: Full code   Family Communication: None at bedside  Disposition Plan: Remain inpatient-requires several more days of  hospitalization  Antimicrobial agents: Anti-infectives    Start     Dose/Rate Route Frequency Ordered Stop   08/17/16 1000  doxycycline (VIBRAMYCIN) 100 mg in dextrose 5 % 250 mL IVPB  Status:  Discontinued     100 mg 125 mL/hr over 120 Minutes Intravenous Every 12 hours 08/17/16 0235 08/17/16 1307   08/17/16 0015  doxycycline (VIBRA-TABS) tablet 100 mg     100 mg Oral  Once 08/17/16 0002 08/17/16 0107      Procedures: Echocardiogram  - Left ventricle: The cavity size was normal. Wall thickness was increased in a pattern of mild LVH. Systolic function was severely reduced. The estimated ejection fraction was in the range of 25% to 30%. No apical thrombus with Defininty contrast. Diffuse hypokinesis. The study is not technically sufficient to allow evaluation of LV diastolic function. - Ventricular septum: Septal motion showed paradox. The contour showed diastolic flattening and systolic flattening. - Mitral valve: Mildly thickened leaflets . There was trivial regurgitation. - Left atrium: Moderately dilated. - Right ventricle: Poorly visualized. The cavity size was moderately dilated. - Right atrium: Moderately dilated. - Pulmonic valve: There was mild regurgitation. - Pulmonary arteries: PA peak pressure: 56 mm Hg (S). - Inferior vena cava: The vessel was dilated. The respirophasic diameter changes were blunted (<50%), consistent with elevated central venous pressure. - Pericardium, extracardiac: Cannot exclude tamponade physiology - clinical correlation is advised. - Moderate size pericardial effusion    CONSULTS:  cardiology  Time spent: 25- minutes-Greater than 50% of this time was spent in  counseling, explanation of diagnosis, planning of further management, and coordination of care.  MEDICATIONS: Scheduled Meds: . aspirin EC  81 mg Oral Daily  . carvedilol  25 mg Oral BID WC  .  ferrous sulfate  325 mg Oral BID WC  . furosemide  160 mg Intravenous Q8H  . heparin  5,000 Units Subcutaneous Q8H  . hydrALAZINE  50 mg Oral BID  . insulin aspart  0-9 Units Subcutaneous TID WC  . insulin glargine  5 Units Subcutaneous QHS  . isosorbide mononitrate  60 mg Oral Daily  . living well with diabetes book   Does not apply Once  . metolazone  5 mg Oral Daily  . pantoprazole  40 mg Oral Daily  . potassium chloride  40 mEq Oral Daily   Continuous Infusions: PRN Meds:.hydrALAZINE, HYDROcodone-acetaminophen, ondansetron **OR** ondansetron (ZOFRAN) IV   PHYSICAL EXAM: Vital signs: Vitals:   08/27/16 1227 08/27/16 1736 08/27/16 2015 08/28/16 0430  BP: 126/68 123/76 127/87 122/74  Pulse: 76 75 77 79  Resp: 19  20 20   Temp: 98.2 F (36.8 C)  97.6 F (36.4 C) 98.2 F (36.8 C)  TempSrc: Oral  Oral Oral  SpO2: 98%  99% 98%  Weight:    (!) 150.8 kg (332 lb 7.3 oz)  Height:       Filed Weights   08/26/16 0355 08/27/16 0447 08/28/16 0430  Weight: (!) 158.1 kg (348 lb 8.8 oz) (!) 153.5 kg (338 lb 8 oz) (!) 150.8 kg (332 lb 7.3 oz)   Body mass index is 46.37 kg/m.   General appearance :Awake, alert, not in any distress. Speech Clear.  Eyes:, pupils equally reactive to light and accomodation,no scleral icterus. HEENT: Atraumatic and Normocephalic Neck: supple, no JVD. No cervical lymphadenopathy. No thyromegaly Resp:Good air entry bilaterally, no added sounds  CVS: S1 S2 regular, no murmurs.  GI: Bowel sounds present, Non tender and not distended with no gaurding, rigidity or rebound. Extremities: B/L Lower Ext shows ++ edema, both legs are warm to touch Neurology:  speech clear,Non focal, sensation is grossly intact. Psychiatric: Normal judgment and insight. Alert and oriented x  3.  Musculoskeletal:No digital cyanosis   I have personally reviewed following labs and imaging studies  LABORATORY DATA: CBC:  Recent Labs Lab 08/26/16 0326 08/27/16 0319  WBC 11.5* 11.0*  HGB 10.7* 10.8*  HCT 33.5* 33.0*  MCV 87.7 87.3  PLT 469* 467*    Basic Metabolic Panel:  Recent Labs Lab 08/23/16 0227 08/25/16 0454 08/26/16 0326 08/27/16 0319 08/28/16 0447  NA 136 136 136 134* 133*  K 4.0 4.2 4.1 3.8 4.0  CL 104 100* 101 97* 96*  CO2 26 26 26 27 29   GLUCOSE 177* 162* 203* 170* 179*  BUN 46* 44* 41* 42* 43*  CREATININE 2.28* 2.17* 2.19* 2.10* 2.20*  CALCIUM 7.7* 8.1* 8.0* 8.2* 8.3*  MG  --  1.8 1.8  --  2.0    GFR: Estimated Creatinine Clearance: 67.3 mL/min (by C-G formula based on SCr of 2.2 mg/dL (H)).  Liver Function Tests:  Recent Labs Lab 08/26/16 0326  AST 33  ALT 27  ALKPHOS 348*  BILITOT 1.2  PROT 6.7  ALBUMIN 1.3*   No results for input(s): LIPASE, AMYLASE in the last 168 hours. No results for input(s): AMMONIA in the last 168 hours.  Coagulation Profile: No results for input(s): INR, PROTIME in the last 168 hours.  Cardiac Enzymes: No results for input(s): CKTOTAL, CKMB, CKMBINDEX, TROPONINI in the last 168 hours.  BNP (last 3 results) No results for input(s): PROBNP in the last 8760 hours.  HbA1C: No results for input(s): HGBA1C in the last 72 hours.  CBG:  Recent Labs Lab 08/27/16  0354 08/27/16 1133 08/27/16 1629 08/27/16 2035 08/28/16 0609  GLUCAP 159* 147* 150* 151* 176*    Lipid Profile: No results for input(s): CHOL, HDL, LDLCALC, TRIG, CHOLHDL, LDLDIRECT in the last 72 hours.  Thyroid Function Tests: No results for input(s): TSH, T4TOTAL, FREET4, T3FREE, THYROIDAB in the last 72 hours.  Anemia Panel: No results for input(s): VITAMINB12, FOLATE, FERRITIN, TIBC, IRON, RETICCTPCT in the last 72 hours.  Urine analysis:    Component Value Date/Time   COLORURINE AMBER (A) 08/17/2016 0129   APPEARANCEUR  HAZY (A) 08/17/2016 0129   LABSPEC 1.016 08/17/2016 0129   PHURINE 5.0 08/17/2016 0129   GLUCOSEU 50 (A) 08/17/2016 0129   HGBUR SMALL (A) 08/17/2016 0129   BILIRUBINUR NEGATIVE 08/17/2016 0129   KETONESUR NEGATIVE 08/17/2016 0129   PROTEINUR >=300 (A) 08/17/2016 0129   UROBILINOGEN 0.2 09/02/2009 0935   NITRITE NEGATIVE 08/17/2016 0129   LEUKOCYTESUR NEGATIVE 08/17/2016 0129    Sepsis Labs: Lactic Acid, Venous No results found for: LATICACIDVEN  MICROBIOLOGY: No results found for this or any previous visit (from the past 240 hour(s)).  RADIOLOGY STUDIES/RESULTS: Dg Chest 2 View  Result Date: 08/16/2016 CLINICAL DATA:  Shortness of breath tonight. EXAM: CHEST  2 VIEW COMPARISON:  02/14/2012 FINDINGS: Moderate-to-marked cardiomegaly which is stable. Central vascular congestion and pulmonary edema. Probable left pleural effusion and minimal fluid in the fissures. No pneumothorax or confluent airspace disease. IMPRESSION: Findings consistent with CHF. Electronically Signed   By: Jeb Levering M.D.   On: 08/16/2016 22:01   US Renal  Result Date: 08/17/2016 CLINICAL DATA:  Acute onset of renal insufficiency. Initial encounter. EXAM: RENAL / URINARY TRACT ULTRASOUND COMPLETE COMPARISON:  CT of the abdomen and pelvis from 09/02/2009 FINDINGS: Right Kidney: Length: 12.6 cm. Increased parenchymal echogenicity noted. No mass or hydronephrosis visualized. Left Kidney: Length: 11.6 cm. Increased parenchymal echogenicity noted. No mass or hydronephrosis visualized. Difficult to fully assess due to overlying structures. Bladder: Appears normal for degree of bladder distention. IMPRESSION: 1. No evidence of hydronephrosis. 2. Increased renal parenchymal echogenicity may reflect medical renal disease. Electronically Signed   By: Garald Balding M.D.   On: 08/17/2016 06:39     LOS: 11 days   Oren Binet, MD  [facility] Pager:[phone]  If 7PM-7AM, please contact  night-coverage [email or web address] Password [email or web address] 08/28/2016, 10:14 AM

## 2016-08-28 NOTE — Progress Notes (Addendum)
This RN attempted to ambulate patient in the hall, patient refused,c/o pain in his left leg, pain medication given,patient education completed, patient verbalised understanding, will continue to [employer]

## 2016-08-29 ENCOUNTER — Inpatient Hospital Stay (HOSPITAL_COMMUNITY): Payer: BLUE CROSS/BLUE SHIELD

## 2016-08-29 LAB — BASIC METABOLIC PANEL
ANION GAP: 11 (ref 5–15)
BUN: 45 mg/dL — ABNORMAL HIGH (ref 6–20)
CALCIUM: 8.4 mg/dL — AB (ref 8.9–10.3)
CO2: 30 mmol/L (ref 22–32)
CREATININE: 2.27 mg/dL — AB (ref 0.61–1.24)
Chloride: 92 mmol/L — ABNORMAL LOW (ref 101–111)
GFR calc Af Amer: 40 mL/min — ABNORMAL LOW (ref >60)
GFR calc non Af Amer: 35 mL/min — ABNORMAL LOW (ref >60)
Glucose, Bld: 173 mg/dL — ABNORMAL HIGH (ref 65–99)
Potassium: 4 mmol/L (ref 3.5–5.1)
SODIUM: 133 mmol/L — AB (ref 135–145)

## 2016-08-29 LAB — GLUCOSE, CAPILLARY
GLUCOSE-CAPILLARY: 187 mg/dL — AB (ref 65–99)
GLUCOSE-CAPILLARY: 164 mg/dL — AB (ref 65–99)
Glucose-Capillary: 187 mg/dL — ABNORMAL HIGH (ref 65–99)
Glucose-Capillary: 219 mg/dL — ABNORMAL HIGH (ref 65–99)

## 2016-08-29 LAB — GRAM STAIN

## 2016-08-29 LAB — SYNOVIAL CELL COUNT + DIFF, W/ CRYSTALS
CRYSTALS FLUID: NONE SEEN
Lymphocytes-Synovial Fld: 4 % (ref 0–20)
MONOCYTE-MACROPHAGE-SYNOVIAL FLUID: 3 % — AB (ref 50–90)
NEUTROPHIL, SYNOVIAL: 93 % — AB (ref 0–25)
WBC, SYNOVIAL: 39525 /mm3 — AB (ref 0–200)

## 2016-08-29 LAB — MAGNESIUM: Magnesium: 1.9 mg/dL (ref 1.7–2.4)

## 2016-08-29 MED ORDER — PREDNISONE 20 MG PO TABS
40.0000 mg | ORAL_TABLET | Freq: Every day | ORAL | Status: DC
  Administered 2016-08-30: 40 mg via ORAL
  Filled 2016-08-29 (×2): qty 2

## 2016-08-29 MED ORDER — METHYLPREDNISOLONE SODIUM SUCC 125 MG IJ SOLR
60.0000 mg | Freq: Once | INTRAMUSCULAR | Status: AC
Start: 2016-08-29 — End: 2016-08-29
  Administered 2016-08-29: 60 mg via INTRAVENOUS
  Filled 2016-08-29: qty 2

## 2016-08-29 MED ORDER — HYDRALAZINE HCL 50 MG PO TABS
50.0000 mg | ORAL_TABLET | Freq: Three times a day (TID) | ORAL | Status: DC
  Administered 2016-08-29 – 2016-09-02 (×12): 50 mg via ORAL
  Filled 2016-08-29 (×12): qty 1

## 2016-08-29 NOTE — Progress Notes (Signed)
PT Cancellation Note  Patient Details Name: Joshua Massey MRN: [id number] DOB: Jul 24, 1975   Cancelled Treatment:    Reason Eval/Treat Not Completed: Patient declined, stated he had been up and down to commode this morning and the gout in his knee was bothering him. Despite max encouragement pt still deferred and stated he would participate later. PT will attempt to return as able.    Doroteo Bradford Dres[place] Lozito 08/29/2016, 10:07 AM   Tracie Harrier, SPT Acute Rehab SPT [phone]

## 2016-08-29 NOTE — Progress Notes (Signed)
PROGRESS NOTE        PATIENT DETAILS Name: Joshua Massey Age: 41 y.o. Sex: male Date of Birth: 03-Nov-1975 Admit Date: 08/16/2016 Admitting Physician Rise Patience, MD PCP:Pcp Not In System  Brief Narrative: Joshua Massey is a 41 year old male with history of nonischemic cardiomyopathy, last EF 35-40% in 2014, who presentedto the ERfor evaluation of massive anasarca. He unfortunately was noncompliant to diuretics.Admitted and started on high-dose Lasix.  Subjective: Has left knee pain that started last night.   Assessment/Plan: Massive anasarca due to acute on chronic systolic heart failureand diabetic nephropathy:Volume status slowly improving, still has edema upto his thighs. -23.9 L so far, weight has decreased to 315 pounds (365 pounds on admission).Per patient, his baseline weight is around 300 pounds.Continue high-dose Lasix and metolazone-follow electrolytes, weights and intake/output. Continue aspirin, carvedilol, isosorbide, hydralazine, noACEIdue to AKI. CHF team now following.    Left Knee swelling/pain: given acute onset-and with diuretic tx-suspect acute gouty arthitis-have consulted ortho for arthrocentesis-will plan on a short course of steroids given ARF  Pericardial effusion:No sign of tamponade. Continue diuresis.   Acute renal failure:Not sure if this is due to cardiorenal syndrome, does appear to have significant amount of proteinuria-which is likely due to diabetic nephropathy. Creatinine and electrolytes stable on high-dose Lasix.Continue to monitor. Will likely need to follow with a nephrologist outpatient.   Essential hypertension:Stable. Continuecarvedilol, hydralazine and isosorbide.   Insulin dependent DM Type 2:CBG's stbale with SSI and Lantus 5  units.  Anemia of iron deficiency:Continue Iron supplement.   Left lower extremity wound:Continue local wound care. Continue tylenol or norco for pain.  DVT  Prophylaxis: Prophylactic Heparin   Code Status: Full code   Family Communication: None at bedside  Disposition Plan: Remain inpatient-requires several more days of hospitalization  Antimicrobial agents: Anti-infectives    Start     Dose/Rate Route Frequency Ordered Stop   08/17/16 1000  doxycycline (VIBRAMYCIN) 100 mg in dextrose 5 % 250 mL IVPB  Status:  Discontinued     100 mg 125 mL/hr over 120 Minutes Intravenous Every 12 hours 08/17/16 0235 08/17/16 1307   08/17/16 0015  doxycycline (VIBRA-TABS) tablet 100 mg     100 mg Oral  Once 08/17/16 0002 08/17/16 0107      Procedures: Echocardiogram  - Left ventricle: The cavity size was normal. Wall thickness was increased in a pattern of mild LVH. Systolic function was severely reduced. The estimated ejection fraction was in the range of 25% to 30%. No apical thrombus with Defininty contrast. Diffuse hypokinesis. The study is not technically sufficient to allow evaluation of LV diastolic function. - Ventricular septum: Septal motion showed paradox. The contour showed diastolic flattening and systolic flattening. - Mitral valve: Mildly thickened leaflets . There was trivial regurgitation. - Left atrium: Moderately dilated. - Right ventricle: Poorly visualized. The cavity size was moderately dilated. - Right atrium: Moderately dilated. - Pulmonic valve: There was mild regurgitation. - Pulmonary arteries: PA peak pressure: 56 mm Hg (S). - Inferior vena cava: The vessel was dilated. The respirophasic diameter changes were blunted (<50%), consistent with elevated central venous pressure. - Pericardium, extracardiac: Cannot exclude tamponade physiology - clinical  correlation is advised. - Moderate size pericardial effusion    CONSULTS:  cardiology  Time spent: 25- minutes-Greater than 50% of this time was spent in counseling, explanation of diagnosis,  planning of further management, and coordination of care.  MEDICATIONS: Scheduled Meds: . aspirin EC  81 mg Oral Daily  . carvedilol  25 mg Oral BID WC  . ferrous sulfate  325 mg Oral BID WC  . furosemide  160 mg Intravenous Q8H  . heparin  5,000 Units Subcutaneous Q8H  . hydrALAZINE  50 mg Oral Q8H  . insulin aspart  0-9 Units Subcutaneous TID WC  . insulin glargine  5 Units Subcutaneous QHS  . isosorbide mononitrate  60 mg Oral Daily  . living well with diabetes book   Does not apply Once  . metolazone  5 mg Oral Daily  . pantoprazole  40 mg Oral Daily  . potassium chloride  40 mEq Oral Daily   Continuous Infusions: PRN Meds:.hydrALAZINE, HYDROcodone-acetaminophen, ondansetron **OR** ondansetron (ZOFRAN) IV   PHYSICAL EXAM: Vital signs: Vitals:   08/28/16 1337 08/28/16 1600 08/28/16 2109 08/29/16 0525  BP: 120/76 121/77 128/89 (!) 138/91  Pulse: 83 80 81 86  Resp: 18  18 18   Temp: 98.7 F (37.1 C)  98.4 F (36.9 C) 98.1 F (36.7 C)  TempSrc: Oral  Oral Oral  SpO2: 94%  97% 96%  Weight:    (!) 143.2 kg (315 lb 9.6 oz)  Height:       Filed Weights   08/27/16 0447 08/28/16 0430 08/29/16 0525  Weight: (!) 153.5 kg (338 lb 8 oz) (!) 150.8 kg (332 lb 7.3 oz) (!) 143.2 kg (315 lb 9.6 oz)   Body mass index is 44.02 kg/m.   General appearance :Awake, alert, not in any distress. Speech Clear.  Eyes:, pupils equally reactive to light and accomodation,no scleral icterus. HEENT: Atraumatic and Normocephalic Neck: supple, no JVD. No cervical lymphadenopathy. No thyromegaly Resp:Good air entry bilaterally, no added sounds  CVS: S1 S2 regular, no murmurs.  GI: Bowel sounds present, Non tender and not distended with no gaurding, rigidity or rebound. Extremities: B/L Lower  Ext shows ++ edema, both legs are warm to touch Neurology:  speech clear,Non focal, sensation is grossly intact. Psychiatric: Normal judgment and insight. Alert and oriented x 3.  Musculoskeletal:No digital cyanosis   I have personally reviewed following labs and imaging studies  LABORATORY DATA: CBC:  Recent Labs Lab 08/26/16 0326 08/27/16 0319  WBC 11.5* 11.0*  HGB 10.7* 10.8*  HCT 33.5* 33.0*  MCV 87.7 87.3  PLT 469* 467*    Basic Metabolic Panel:  Recent Labs Lab 08/25/16 0454 08/26/16 0326 08/27/16 0319 08/28/16 0447 08/29/16 0155  NA 136 136 134* 133* 133*  K 4.2 4.1 3.8 4.0 4.0  CL 100* 101 97* 96* 92*  CO2 26 26 27 29 30   GLUCOSE 162* 203* 170* 179* 173*  BUN 44* 41* 42* 43* 45*  CREATININE 2.17* 2.19* 2.10* 2.20* 2.27*  CALCIUM 8.1* 8.0* 8.2* 8.3* 8.4*  MG 1.8 1.8  --  2.0 1.9    GFR: Estimated Creatinine Clearance: 63.3 mL/min (by C-G formula based on SCr of 2.27 mg/dL (H)).  Liver Function Tests:  Recent Labs Lab 08/26/16 0326  AST 33  ALT 27  ALKPHOS 348*  BILITOT 1.2  PROT 6.7  ALBUMIN 1.3*   No results for input(s): LIPASE, AMYLASE in the last 168 hours. No results for input(s): AMMONIA in the last 168 hours.  Coagulation Profile: No results for input(s): INR, PROTIME in the last 168 hours.  Cardiac Enzymes: No results for input(s): CKTOTAL, CKMB, CKMBINDEX, TROPONINI in the last 168 hours.  BNP (last 3  results) No results for input(s): PROBNP in the last 8760 hours.  HbA1C: No results for input(s): HGBA1C in the last 72 hours.  CBG:  Recent Labs Lab 08/28/16 0609 08/28/16 1148 08/28/16 1620 08/28/16 2127 08/29/16 0636  GLUCAP 176* 148* 165* 150* 187*    Lipid Profile: No results for input(s): CHOL, HDL, LDLCALC, TRIG, CHOLHDL, LDLDIRECT in the last 72 hours.  Thyroid Function Tests: No results for input(s): TSH, T4TOTAL, FREET4, T3FREE, THYROIDAB in the last 72 hours.  Anemia Panel: No results for input(s):  VITAMINB12, FOLATE, FERRITIN, TIBC, IRON, RETICCTPCT in the last 72 hours.  Urine analysis:    Component Value Date/Time   COLORURINE AMBER (A) 08/17/2016 0129   APPEARANCEUR HAZY (A) 08/17/2016 0129   LABSPEC 1.016 08/17/2016 0129   PHURINE 5.0 08/17/2016 0129   GLUCOSEU 50 (A) 08/17/2016 0129   HGBUR SMALL (A) 08/17/2016 0129   BILIRUBINUR NEGATIVE 08/17/2016 0129   KETONESUR NEGATIVE 08/17/2016 0129   PROTEINUR >=300 (A) 08/17/2016 0129   UROBILINOGEN 0.2 09/02/2009 0935   NITRITE NEGATIVE 08/17/2016 0129   LEUKOCYTESUR NEGATIVE 08/17/2016 0129    Sepsis Labs: Lactic Acid, Venous No results found for: LATICACIDVEN  MICROBIOLOGY: No results found for this or any previous visit (from the past 240 hour(s)).  RADIOLOGY STUDIES/RESULTS: Dg Chest 2 View  Result Date: 08/16/2016 CLINICAL DATA:  Shortness of breath tonight. EXAM: CHEST  2 VIEW COMPARISON:  02/14/2012 FINDINGS: Moderate-to-marked cardiomegaly which is stable. Central vascular congestion and pulmonary edema. Probable left pleural effusion and minimal fluid in the fissures. No pneumothorax or confluent airspace disease. IMPRESSION: Findings consistent with CHF. Electronically Signed   By: Jeb Levering M.D.   On: 08/16/2016 22:01   US Renal  Result Date: 08/17/2016 CLINICAL DATA:  Acute onset of renal insufficiency. Initial encounter. EXAM: RENAL / URINARY TRACT ULTRASOUND COMPLETE COMPARISON:  CT of the abdomen and pelvis from 09/02/2009 FINDINGS: Right Kidney: Length: 12.6 cm. Increased parenchymal echogenicity noted. No mass or hydronephrosis visualized. Left Kidney: Length: 11.6 cm. Increased parenchymal echogenicity noted. No mass or hydronephrosis visualized. Difficult to fully assess due to overlying structures. Bladder: Appears normal for degree of bladder distention. IMPRESSION: 1. No evidence of hydronephrosis. 2. Increased renal parenchymal echogenicity may reflect medical renal disease. Electronically Signed    By: Garald Balding M.D.   On: 08/17/2016 06:39     LOS: 12 days   Oren Binet, MD  [facility] Pager:[phone]  If 7PM-7AM, please contact night-coverage [email or web address] Password [email or web address] 08/29/2016, 9:22 AM

## 2016-08-29 NOTE — Plan of Care (Signed)
Problem: Health Behavior/Discharge Planning: Goal: Ability to manage health-related needs will improve Outcome: Not Progressing Patient is not progressing himself, education and positive reinforcement given to patient.

## 2016-08-29 NOTE — Progress Notes (Signed)
Advanced Heart Failure Rounding Note  PCP: No PCP Primary Cardiologist: Dr. Gwenlyn Found   Subjective:    Admitted on 08/16/16 with acute on chronic combined systolic and diastolic CHF (EF 77-41%), weight gain of about 50 pounds with medical and dietary noncompliance. He has been seen in the past by the CHF team in 2014, but did not follow up. Diuresis sluggish so CHF team was consulted on 08/26/16.   Has diuresed well on Lasix 160mg  q 8 hours + 5mg  Metolazone. Weight down 50 pounds since admission. Creatinine 2.19->2.10->2.20->2.27.   Feels well today, having left knee pain when he moves. Also is tender to touch. Did get up yesterday.    Objective:   Weight Range: (!) 315 lb 9.6 oz (143.2 kg) Body mass index is 44.02 kg/m.   Vital Signs:   Temp:  [98.1 F (36.7 C)-98.7 F (37.1 C)] 98.1 F (36.7 C) (03/12 0525) Pulse Rate:  [80-102] 86 (03/12 0525) Resp:  [18] 18 (03/12 0525) BP: (111-138)/(67-91) 138/91 (03/12 0525) SpO2:  [94 %-97 %] 96 % (03/12 0525) Weight:  [315 lb 9.6 oz (143.2 kg)] 315 lb 9.6 oz (143.2 kg) (03/12 0525) Last BM Date: 08/28/16  Weight change: Filed Weights   08/27/16 0447 08/28/16 0430 08/29/16 0525  Weight: (!) 338 lb 8 oz (153.5 kg) (!) 332 lb 7.3 oz (150.8 kg) (!) 315 lb 9.6 oz (143.2 kg)    Intake/Output:   Intake/Output Summary (Last 24 hours) at 08/29/16 0740 Last data filed at 08/29/16 0534  Gross per 24 hour  Intake              600 ml  Output             5935 ml  Net            -5335 ml     Physical Exam: General:  Obese male in NAD HEENT: normal Neck: supple. JVP 12 cm. Carotids 2+ bilat; no bruits. No lymphadenopathy or thyromegaly appreciated. Cor: PMI nondisplaced. Regular rate & rhythm. No rubs, gallops or murmurs. Lungs: Diminished in bases, CTA in upper lobes.  Abdomen: soft, nontender,obese nondistended. No hepatosplenomegaly. No bruits or masses. Good bowel sounds. Extremities: no cyanosis, clubbing, rash. 1+ edema to knees  bilaterally. Left knee is tender to touch, slightly erythematous.  Neuro: alert & orientedx3, cranial nerves grossly intact. moves all 4 extremities w/o difficulty. Affect pleasant   Telemetry: Sinus tach rates in the 100's  Labs: CBC  Recent Labs  08/27/16 0319  WBC 11.0*  HGB 10.8*  HCT 33.0*  MCV 87.3  PLT 287*   Basic Metabolic Panel  Recent Labs  08/28/16 0447 08/29/16 0155  NA 133* 133*  K 4.0 4.0  CL 96* 92*  CO2 29 30  GLUCOSE 179* 173*  BUN 43* 45*  CREATININE 2.20* 2.27*  CALCIUM 8.3* 8.4*  MG 2.0 1.9    BNP: BNP (last 3 results)  Recent Labs  08/16/16 2055  BNP 2,062.6*   Transthoracic Echocardiography 08/17/16 Study Conclusions  - Procedure narrative: Transthoracic echocardiography. Image quality was adequate. Intravenous contrast (Definity) was administered. - Left ventricle: The cavity size was normal. Wall thickness was increased in a pattern of mild LVH. Systolic function was severely reduced. The estimated ejection fraction was in the range of 25% to 30%. No apical thrombus with Defininty contrast. Diffuse hypokinesis. The study is not technically sufficient to allow evaluation of LV diastolic function. - Ventricular septum: Septal motion showed paradox. The contour showed  diastolic flattening and systolic flattening. - Mitral valve: Mildly thickened leaflets . There was trivial regurgitation. - Left atrium: Moderately dilated. - Right ventricle: Poorly visualized. The cavity size was moderately dilated. - Right atrium: Moderately dilated. - Pulmonic valve: There was mild regurgitation. - Pulmonary arteries: PA peak pressure: 56 mm Hg (S). - Inferior vena cava: The vessel was dilated. The respirophasic diameter changes were blunted (<50%), consistent with elevated central venous pressure. - Pericardium, extracardiac: Cannot exclude tamponade physiology - clinical correlation is  advised.  Impressions:  - Compared to a prior study in 2014, the LVEF is lower at 25-30%. There is now a moderate size pericardial effusion - tamponade physiology cannot be excluded, especially in the setting of moderate pulmonary hypertension with signs of volume and pressure overload of the RV.   Medications:     Scheduled Medications: . aspirin EC  81 mg Oral Daily  . carvedilol  25 mg Oral BID WC  . ferrous sulfate  325 mg Oral BID WC  . furosemide  160 mg Intravenous Q8H  . heparin  5,000 Units Subcutaneous Q8H  . hydrALAZINE  50 mg Oral BID  . insulin aspart  0-9 Units Subcutaneous TID WC  . insulin glargine  5 Units Subcutaneous QHS  . isosorbide mononitrate  60 mg Oral Daily  . living well with diabetes book   Does not apply Once  . metolazone  5 mg Oral Daily  . pantoprazole  40 mg Oral Daily  . potassium chloride  40 mEq Oral Daily     PRN Medications:  hydrALAZINE, HYDROcodone-acetaminophen, ondansetron **OR** ondansetron (ZOFRAN) IV   Assessment/Plan   1. Acute on chronic combined systolic and diastolic CHF: NYHA IIB, first diagnosed in 2013,EF 25-30% this admission, presented with volume overload. Currently on IV Lasix 160mg  q 8 hours. Weight down 17 pounds from admission, but remains markedly volume overloaded. On physical exam he appears warm and wet. He has 3+ pitting edema up to his thighs.  - NICM - had normal cors in 2015, LVH on Echo this admission, but cavity size normal.  - Continue high dose Lasix 160mg  IV q 8 hours.  - Continue 5mg  metolazone today.  - Continue Imdur to 60 mg daily.  - Continue hydralazine 50mg  TID.   - Continue Coreg 25mg  BID.  - Will not be able to use Dig, Arlyce Harman or [place] with elevated creatinine.  2. Acute on chronic kidney disease: of note, in 2014 Creatinine was 1.04, on admit creatinine is 2.49. Renal ultrasound on 2/28 without evidence of hydronephrosis.  Likely CKD rather than AKI. Does not currently have a  nephrologist. - Creatinine is 2.27 today. Improved from admission creatinine which was 2.49. Will follow with daily BMET.   - No ACE or ARB currently, avoiding nephrotoxic agents.  3. Uncontrolled DM type 2: A1c is 11.2. Discussed diet modifications. On sliding scale here. Needs PCP to manage.  4. HTN: Well controlled on current regimen.  5. Noncompliance: Discussed with patient the importance of adhering to his medication regimen.  6. Morbid Obesity Body mass index is 48.61 kg/m.  7. Moderate sized pericardial effusion: Had moderate pericardial effusion by Echo, no tamponade symptoms currently. Did have low voltage on EKG which was new when compared to prior EKG's. 8. Left knee pain: Patient getting hydrocodone 10-325mg  q 4 hours. Still having pain with movement. Advised him to sit in chair and elevate knee on pillow.   Length of Stay: [place], NP  08/29/2016, 7:40 AM  Advanced Heart Failure Team Pager [phone] (M-F; 7a - 4p)  Please contact [facility] for night-coverage after hours (4p -7a ) and weekends on [email or web address]  Patient seen with NP, agree with the above note.  Patient has diuresed very well so far.  Creatinine is stable.  He is still volume overloaded on exam though down 50 lbs so far.   - Would continue current regimen of IV Lasix and metolazone.  - Increase hydralazine to 50 mg tid.  Continue to titrate up hydralazine/Imdur as tolerated.   Loralie Champagne 08/29/2016 8:51 AM

## 2016-08-29 NOTE — Consult Note (Signed)
ORTHOPAEDIC CONSULTATION  REQUESTING PHYSICIAN: Jonetta Osgood, MD  Chief Complaint: Left knee pain  Assessment: Principal Problem:   Acute on chronic systolic CHF (congestive heart failure) ([place]) Active Problems:   Essential hypertension   CHF (congestive heart failure) (HCC)   Type 2 diabetes mellitus with vascular disease (HCC)   Acute renal failure (HCC)   Acute systolic CHF (congestive heart failure) (HCC)   Peripheral edema   AKI (acute kidney injury) ([place])   CKD (chronic kidney disease) stage 4, GFR 15-29 ml/min (HCC)  Left knee pain with effusion: Nonweightbearing X-ray negative for acute osseous abnormality.  High suspicion for acute gouty arthritis.  Plan: Left knee aspiration, cortisone injection. Ace wrap, ice. Weight Bearing Status: As tolerated  Follow up with Dr. Alain Marion as needed.  Please call with questions.  Procedure: PATIENT:  Joshua Massey   PRE-PROCEDURE DIAGNOSIS:  Left knee pain and effusion POST-OPERATIVE DIAGNOSIS:  Same PROCEDURE:  Aspiration / Intra-articular cortisone injection left knee  PROCEDURE DETAILS:  After informed verbal consent was obtained the superolateral portal of the left knee was prepped with chlorhexidine. Approximately 10 mL of lidocaine were injected superficially and then into the deeper tissues and into the joint space. Flash identified appropriate positioning and pressurized joint space. Medicine was left to sit for several minutes to take effect. Next, a 60 mL syringe with an 18-gauge needle was used to aspirate approximately 80 ml of Cloudy straw colored fluid.  4 ml of Marcaine and 1 ml of Depo-Medrol (40mg ) was then injected into the joint space. He tolerated this well without complication and a Band-Aid was placed.   Charna Elizabeth [place] III, PA-C 08/29/2016 6:30 PM   HPI: ZACHRY HOPFENSPERGER is a 41 y.o. male who complains of 2 days of left knee pain and swelling.   He is uncertain of gout history-he  "may have had some in his foot once."  Pain is diffuse and limits weightbearing and range of motion.  Weightbearing and moving make it worse. Nothing so far has improved his symptoms significantly.  He denies traumatic event. No fever, systemic symptoms, or chills.  He denies feelings of instability in the knee. No catching or locking. No history of surgery on the knee.  Past Medical History:  Diagnosis Date  . 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 Diabetes mellitus    adult-onset, type 2  . Exogenous obesity   . GERD (gastroesophageal reflux disease)   . Hypertension    Past Surgical History:  Procedure Laterality Date  . ANKLE SURGERY  2003   plate and screws  . CARDIAC CATHETERIZATION  02/16/2012   50% LAD lesion in mid vessel, otherwise no significant obstructive CAD  . HIP PINNING  1990   bilateral  . LEFT AND RIGHT HEART CATHETERIZATION WITH CORONARY ANGIOGRAM N/A 02/16/2012   Procedure: LEFT AND RIGHT HEART CATHETERIZATION WITH CORONARY ANGIOGRAM;  Surgeon: Pixie Casino, MD;  Location[facility] CATH LAB;  Service: Cardiovascular;  Laterality: N/A;  . TRANSTHORACIC ECHOCARDIOGRAM  08/31/2012   EF 35-40%, no significant wall motion abnormalities, diastolic relaxation abnormality - no longer needed LifeVest; LV cavity size mod dilated with mod conc hypertrophy, systolic function mod reduced; mild MR, LA mod dilated   Social History   Social History  . Marital status: Single    Spouse name: N/A  . Number of children: 6  . Years of education: 79   Occupational History  . 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 Smoking status: Never Smoker  . Smokeless tobacco: Never Used  . Alcohol use No  . Drug use: No  . Sexual activity: Yes    Birth control/ protection: Condom   Other Topics Concern  . None   Social History Narrative  . None   Family History  Problem Relation Age  of Onset  . Coronary artery disease Brother     CABG at 44, DM  . Diabetes Mother   . Hypertension Mother   . Diabetes Father    No Known Allergies Prior to Admission medications   Medication Sig Start Date End Date Taking? Authorizing Provider  acetaminophen (TYLENOL) 500 MG tablet Take 1,000 mg by mouth every 6 (six) hours as needed for moderate pain or headache.   Yes Historical Provider, MD  aspirin 81 MG tablet Take 81 mg by mouth daily.   Yes Historical Provider, MD  carvedilol (COREG) 25 MG tablet TAKE ONE TABLET BY MOUTH TWICE DAILY WITH  A  MEAL Patient taking differently: Take 25 mg by mouth 2 (two) times daily with a meal.  10/30/15  Yes Lorretta Harp, MD  digoxin (LANOXIN) 0.25 MG tablet Take 1 tablet (250 mcg total) by mouth daily. 10/30/15  Yes Lorretta Harp, MD  furosemide (LASIX) 40 MG tablet Take 1 tablet (40 mg total) by mouth 2 (two) times daily. 04/06/16  Yes Lorretta Harp, MD  hydrALAZINE (APRESOLINE) 50 MG tablet Take 1 tablet (50 mg total) by mouth 2 (two) times daily. 10/30/15  Yes Lorretta Harp, MD  isosorbide mononitrate (IMDUR) 30 MG 24 hr tablet Take 1 tablet (30 mg total) by mouth daily. 10/30/15  Yes Lorretta Harp, MD  losartan (COZAAR) 100 MG tablet Take 1 tablet (100 mg total) by mouth daily. 10/30/15  Yes Lorretta Harp, MD  spironolactone (ALDACTONE) 25 MG tablet Take 1 tablet (25 mg total) by mouth daily. 10/30/15  Yes Lorretta Harp, MD  Insulin Glargine (LANTUS SOLOSTAR) 100 UNIT/ML Solostar Pen Inject 30 Units into the skin every morning. And pen needles 1/day Patient not taking: Reported on 08/17/2016 12/30/15   Renato Shin, MD  [facility] VERIO test strip USE DAILY Patient not taking: Reported on 08/17/2016 02/12/15   Renato Shin, MD  pantoprazole (PROTONIX) 40 MG tablet TAKE ONE TABLET BY MOUTH ONCE DAILY Patient not taking: Reported on 08/17/2016 12/21/15   Lorretta Harp, MD   Dg Knee 1-2 Views Left  Result Date: 08/29/2016 CLINICAL  DATA:  Pain and swelling. EXAM: LEFT KNEE - 1-2 VIEW COMPARISON:  08/22/2013. FINDINGS: Prominent knee joint effusion noted. No evidence of fracture or dislocation. IMPRESSION: Prominent knee joint effusion.  No acute bony abnormality. Electronically Signed   By: Marcello Moores  Register   On: 08/29/2016 16:32   Positive ROS: All other systems have been reviewed and were otherwise negative with regard to this complaint with the exception of those mentioned in the HPI and as above.  Objective: Labs cbc  Recent Labs  08/27/16 0319  WBC 11.0*  HGB 10.8*  HCT 33.0*  PLT 467*    Recent Labs  08/28/16 0447 08/29/16 0155  NA 133* 133*  K 4.0 4.0  CL 96* 92*  CO2 29 30  GLUCOSE 179* 173*  BUN 43* 45*  CREATININE 2.20* 2.27*  CALCIUM 8.3* 8.4*    Physical Exam:  Vitals:   08/29/16 1028 08/29/16 1332  BP: 128/81 (!) 136/96  Pulse: 85 98  Resp:  20  Temp:  98 F (36.7 C)   General: Alert, no acute distress.  Supine in bed Mental status: Alert and Oriented x3 Neurologic: Speech Clear and organized, no gross focal findings or movement disorder appreciated. Respiratory: No cyanosis, no use of accessory musculature Cardiovascular: RRR GI: Abdomen is protuberant, soft and non-tender, non-distended. Skin: Warm and dry.  No lesions in the area of chief complaint Extremities: Warm and dry Psychiatric: Patient is competent for consent with normal mood and affect  MUSCULOSKELETAL:  Right knee without erythema, lesion, or signs of infection. Palpable effusion. Pain with range of motion. Mild diffuse anterior pain with deep palpation. Stable to varus and valgus stress. Other extremities are atraumatic with painless ROM and NVI.   Prudencio Burly III PA-C 08/29/2016 6:14 PM

## 2016-08-29 NOTE — Progress Notes (Signed)
Patient c/o bandage being too tight. This RN rewrapped the leg and patient stated that,"it felt better".   Patient called out about 30 minutes later c/o of pain in his leg that would not quit. Removed ace wrap but kept the kerlix dressing on. Elevated patient's leg on pillows and positioned the bed so that elevation of his leg would be maintained in that position and hopefully reduce swelling and pain. Pain medicine was not due at that time. Will continue to monitor.

## 2016-08-30 ENCOUNTER — Inpatient Hospital Stay (HOSPITAL_COMMUNITY): Payer: BLUE CROSS/BLUE SHIELD | Admitting: Certified Registered Nurse Anesthetist

## 2016-08-30 ENCOUNTER — Encounter (HOSPITAL_COMMUNITY)
Admission: EM | Disposition: A | Discharge status: Home Health | Payer: Self-pay | Source: Home / Self Care | Attending: Internal Medicine

## 2016-08-30 ENCOUNTER — Encounter (HOSPITAL_COMMUNITY): Payer: Self-pay | Admitting: Anesthesiology

## 2016-08-30 DIAGNOSIS — A498 Other bacterial infections of unspecified site: Secondary | ICD-10-CM

## 2016-08-30 DIAGNOSIS — M01X0 Direct infection of unspecified joint in infectious and parasitic diseases classified elsewhere: Secondary | ICD-10-CM

## 2016-08-30 DIAGNOSIS — M009 Pyogenic arthritis, unspecified: Secondary | ICD-10-CM

## 2016-08-30 HISTORY — PX: KNEE ARTHROSCOPY: SHX127

## 2016-08-30 LAB — GLUCOSE, CAPILLARY
GLUCOSE-CAPILLARY: 226 mg/dL — AB (ref 65–99)
Glucose-Capillary: 188 mg/dL — ABNORMAL HIGH (ref 65–99)
Glucose-Capillary: 236 mg/dL — ABNORMAL HIGH (ref 65–99)
Glucose-Capillary: 238 mg/dL — ABNORMAL HIGH (ref 65–99)
Glucose-Capillary: 211 mg/dL — ABNORMAL HIGH (ref 65–99)
Glucose-Capillary: 262 mg/dL — ABNORMAL HIGH (ref 65–99)

## 2016-08-30 LAB — BASIC METABOLIC PANEL
ANION GAP: 12 (ref 5–15)
BUN: 53 mg/dL — ABNORMAL HIGH (ref 6–20)
CO2: 30 mmol/L (ref 22–32)
Calcium: 8.2 mg/dL — ABNORMAL LOW (ref 8.9–10.3)
Chloride: 92 mmol/L — ABNORMAL LOW (ref 101–111)
Creatinine, Ser: 2.38 mg/dL — ABNORMAL HIGH (ref 0.61–1.24)
GFR, EST AFRICAN AMERICAN: 38 mL/min — AB (ref >60)
GFR, EST NON AFRICAN AMERICAN: 33 mL/min — AB (ref >60)
Glucose, Bld: 226 mg/dL — ABNORMAL HIGH (ref 65–99)
POTASSIUM: 4.5 mmol/L (ref 3.5–5.1)
SODIUM: 134 mmol/L — AB (ref 135–145)

## 2016-08-30 LAB — GRAM STAIN

## 2016-08-30 SURGERY — ARTHROSCOPY, KNEE
Anesthesia: Monitor Anesthesia Care | Laterality: Left

## 2016-08-30 MED ORDER — LIDOCAINE HCL (CARDIAC) 20 MG/ML IV SOLN
INTRAVENOUS | Status: DC | PRN
  Administered 2016-08-30: 50 mg via INTRATRACHEAL

## 2016-08-30 MED ORDER — FENTANYL CITRATE (PF) 100 MCG/2ML IJ SOLN
INTRAMUSCULAR | Status: AC
  Filled 2016-08-30: qty 4

## 2016-08-30 MED ORDER — HYDROMORPHONE HCL 1 MG/ML IJ SOLN
INTRAMUSCULAR | Status: AC
  Filled 2016-08-30: qty 1.5

## 2016-08-30 MED ORDER — NEOSTIGMINE METHYLSULFATE 5 MG/5ML IV SOSY
PREFILLED_SYRINGE | INTRAVENOUS | Status: AC
  Filled 2016-08-30: qty 5

## 2016-08-30 MED ORDER — HYDROMORPHONE HCL 1 MG/ML IJ SOLN
0.2500 mg | INTRAMUSCULAR | Status: DC | PRN
  Administered 2016-08-30 (×2): 0.5 mg via INTRAVENOUS

## 2016-08-30 MED ORDER — KETOROLAC TROMETHAMINE 30 MG/ML IJ SOLN
INTRAMUSCULAR | Status: AC
  Filled 2016-08-30: qty 1

## 2016-08-30 MED ORDER — LACTATED RINGERS IV SOLN
INTRAVENOUS | Status: DC
  Administered 2016-08-30: 13:00:00 via INTRAVENOUS

## 2016-08-30 MED ORDER — FENTANYL CITRATE (PF) 100 MCG/2ML IJ SOLN
100.0000 ug | Freq: Once | INTRAMUSCULAR | Status: AC
  Administered 2016-08-30: 50 ug via INTRAVENOUS

## 2016-08-30 MED ORDER — VANCOMYCIN HCL 10 G IV SOLR
1500.0000 mg | INTRAVENOUS | Status: DC
  Filled 2016-08-30: qty 1500

## 2016-08-30 MED ORDER — BUPIVACAINE HCL (PF) 0.5 % IJ SOLN
INTRAMUSCULAR | Status: AC
  Filled 2016-08-30: qty 30

## 2016-08-30 MED ORDER — SODIUM CHLORIDE 0.9 % IR SOLN
Status: DC | PRN
  Administered 2016-08-30: 3000 mL

## 2016-08-30 MED ORDER — LIDOCAINE 2% (20 MG/ML) 5 ML SYRINGE
INTRAMUSCULAR | Status: AC
  Filled 2016-08-30: qty 5

## 2016-08-30 MED ORDER — PROPOFOL 10 MG/ML IV BOLUS
INTRAVENOUS | Status: AC
  Filled 2016-08-30: qty 20

## 2016-08-30 MED ORDER — BUPIVACAINE-EPINEPHRINE 0.5% -1:200000 IJ SOLN
INTRAMUSCULAR | Status: DC | PRN
  Administered 2016-08-30: 10 mL

## 2016-08-30 MED ORDER — VANCOMYCIN HCL 10 G IV SOLR
2500.0000 mg | Freq: Once | INTRAVENOUS | Status: DC
  Filled 2016-08-30: qty 2500

## 2016-08-30 MED ORDER — ROCURONIUM BROMIDE 50 MG/5ML IV SOSY
PREFILLED_SYRINGE | INTRAVENOUS | Status: AC
  Filled 2016-08-30: qty 5

## 2016-08-30 MED ORDER — FENTANYL CITRATE (PF) 100 MCG/2ML IJ SOLN
INTRAMUSCULAR | Status: AC
  Filled 2016-08-30: qty 2

## 2016-08-30 MED ORDER — FUROSEMIDE 10 MG/ML IJ SOLN
160.0000 mg | Freq: Two times a day (BID) | INTRAVENOUS | Status: DC
  Administered 2016-08-30 – 2016-08-31 (×2): 160 mg via INTRAVENOUS
  Filled 2016-08-30 (×3): qty 16

## 2016-08-30 MED ORDER — FENTANYL CITRATE (PF) 100 MCG/2ML IJ SOLN
INTRAMUSCULAR | Status: DC | PRN
  Administered 2016-08-30: 100 ug via INTRAVENOUS

## 2016-08-30 MED ORDER — MIDAZOLAM HCL 2 MG/2ML IJ SOLN
INTRAMUSCULAR | Status: AC
  Filled 2016-08-30: qty 2

## 2016-08-30 MED ORDER — FENTANYL CITRATE (PF) 100 MCG/2ML IJ SOLN: 25.0000 ug | INTRAMUSCULAR | Status: DC | PRN

## 2016-08-30 MED ORDER — ROPIVACAINE HCL 7.5 MG/ML IJ SOLN
INTRAMUSCULAR | Status: DC | PRN
  Administered 2016-08-30: 20 mL via PERINEURAL

## 2016-08-30 MED ORDER — LIDOCAINE HCL (PF) 1 % IJ SOLN
INTRAMUSCULAR | Status: AC
  Filled 2016-08-30: qty 30

## 2016-08-30 MED ORDER — SODIUM CHLORIDE 0.9 % IV SOLN
1250.0000 mg | Freq: Two times a day (BID) | INTRAVENOUS | Status: DC
  Administered 2016-08-30: 1500 mg via INTRAVENOUS
  Filled 2016-08-30: qty 1250

## 2016-08-30 MED ORDER — LIDOCAINE HCL 1 % IJ SOLN
INTRAMUSCULAR | Status: DC | PRN
  Administered 2016-08-30: 10 mL

## 2016-08-30 MED ORDER — SODIUM CHLORIDE 0.9 % IR SOLN
Status: DC | PRN
  Administered 2016-08-30: 1000 mL

## 2016-08-30 MED ORDER — VANCOMYCIN HCL 10 G IV SOLR
1500.0000 mg | INTRAVENOUS | Status: AC
  Administered 2016-08-30: 1500 mg via INTRAVENOUS
  Filled 2016-08-30: qty 1500

## 2016-08-30 MED ORDER — MIDAZOLAM HCL 2 MG/2ML IJ SOLN: 1.0000 mg | INTRAMUSCULAR | Status: DC | PRN

## 2016-08-30 MED ORDER — ONDANSETRON HCL 4 MG/2ML IJ SOLN: 4.0000 mg | Freq: Once | INTRAMUSCULAR | Status: DC | PRN

## 2016-08-30 MED ORDER — PROPOFOL 500 MG/50ML IV EMUL
INTRAVENOUS | Status: DC | PRN
  Administered 2016-08-30: 50 ug/kg/min via INTRAVENOUS

## 2016-08-30 MED ORDER — DEXAMETHASONE SODIUM PHOSPHATE 10 MG/ML IJ SOLN
INTRAMUSCULAR | Status: AC
  Filled 2016-08-30: qty 1

## 2016-08-30 MED ORDER — DIPHENHYDRAMINE HCL 50 MG/ML IJ SOLN
INTRAMUSCULAR | Status: AC
  Filled 2016-08-30: qty 1

## 2016-08-30 MED ORDER — ONDANSETRON HCL 4 MG/2ML IJ SOLN
INTRAMUSCULAR | Status: AC
  Filled 2016-08-30: qty 2

## 2016-08-30 MED ORDER — MORPHINE SULFATE (PF) 2 MG/ML IV SOLN
2.0000 mg | INTRAVENOUS | Status: DC | PRN
  Administered 2016-08-30 – 2016-08-31 (×2): 2 mg via INTRAVENOUS
  Filled 2016-08-30 (×3): qty 1

## 2016-08-30 MED ORDER — MIDAZOLAM HCL 2 MG/2ML IJ SOLN
INTRAMUSCULAR | Status: AC
  Administered 2016-08-30: 2 mg
  Filled 2016-08-30: qty 2

## 2016-08-30 MED ORDER — PROPOFOL 10 MG/ML IV BOLUS
INTRAVENOUS | Status: DC | PRN
  Administered 2016-08-30 (×4): 20 mg via INTRAVENOUS

## 2016-08-30 MED ORDER — LACTATED RINGERS IV SOLN
INTRAVENOUS | Status: DC | PRN
  Administered 2016-08-30: 14:00:00 via INTRAVENOUS

## 2016-08-30 MED ORDER — MIDAZOLAM HCL 5 MG/5ML IJ SOLN
INTRAMUSCULAR | Status: DC | PRN
  Administered 2016-08-30: 2 mg via INTRAVENOUS

## 2016-08-30 MED ORDER — EPINEPHRINE PF 1 MG/ML IJ SOLN
INTRAMUSCULAR | Status: AC
  Filled 2016-08-30: qty 1

## 2016-08-30 MED ORDER — MEPERIDINE HCL 25 MG/ML IJ SOLN: 6.2500 mg | INTRAMUSCULAR | Status: DC | PRN

## 2016-08-30 MED ORDER — ONDANSETRON HCL 4 MG/2ML IJ SOLN
INTRAMUSCULAR | Status: DC | PRN
  Administered 2016-08-30: 4 mg via INTRAVENOUS

## 2016-08-30 SURGICAL SUPPLY — 41 items
BANDAGE ACE 6X5 VEL STRL LF (GAUZE/BANDAGES/DRESSINGS) IMPLANT
BANDAGE ELASTIC 6 VELCRO ST LF (GAUZE/BANDAGES/DRESSINGS) ×3 IMPLANT
BANDAGE ESMARK 6X9 LF (GAUZE/BANDAGES/DRESSINGS) IMPLANT
BLADE CLIPPER SURG (BLADE) IMPLANT
BLADE GREAT WHITE 4.2 (BLADE) ×2 IMPLANT
BLADE GREAT WHITE 4.2MM (BLADE) ×1
BNDG ESMARK 6X9 LF (GAUZE/BANDAGES/DRESSINGS)
COVER SURGICAL LIGHT HANDLE (MISCELLANEOUS) ×3 IMPLANT
CUFF TOURNIQUET SINGLE 34IN LL (TOURNIQUET CUFF) IMPLANT
CUTTER MENISCUS 3.5MM 6/BX (BLADE) ×3 IMPLANT
DRAPE ARTHROSCOPY W/POUCH 114 (DRAPES) ×3 IMPLANT
DRAPE U-SHAPE 47X51 STRL (DRAPES) ×3 IMPLANT
DRSG ADAPTIC 3X8 NADH LF (GAUZE/BANDAGES/DRESSINGS) ×3 IMPLANT
DRSG PAD ABDOMINAL 8X10 ST (GAUZE/BANDAGES/DRESSINGS) ×3 IMPLANT
FACESHIELD WRAPAROUND (MASK) ×3 IMPLANT
GAUZE SPONGE 4X4 12PLY STRL (GAUZE/BANDAGES/DRESSINGS) ×3 IMPLANT
GAUZE XEROFORM 1X8 LF (GAUZE/BANDAGES/DRESSINGS) ×3 IMPLANT
GLOVE BIO SURGEON STRL SZ7.5 (GLOVE) ×6 IMPLANT
GLOVE BIOGEL PI IND STRL 8 (GLOVE) ×2 IMPLANT
GLOVE BIOGEL PI INDICATOR 8 (GLOVE) ×4
GOWN STRL REUS W/ TWL LRG LVL3 (GOWN DISPOSABLE) ×2 IMPLANT
GOWN STRL REUS W/ TWL XL LVL3 (GOWN DISPOSABLE) ×1 IMPLANT
GOWN STRL REUS W/TWL LRG LVL3 (GOWN DISPOSABLE) ×4
GOWN STRL REUS W/TWL XL LVL3 (GOWN DISPOSABLE) ×2
KIT ROOM TURNOVER OR (KITS) ×3 IMPLANT
MANIFOLD NEPTUNE II (INSTRUMENTS) IMPLANT
NEEDLE 22X1 1/2 (OR ONLY) (NEEDLE) ×3 IMPLANT
NS IRRIG 1000ML POUR BTL (IV SOLUTION) IMPLANT
PACK ARTHROSCOPY DSU (CUSTOM PROCEDURE TRAY) ×3 IMPLANT
PAD ARMBOARD 7.5X6 YLW CONV (MISCELLANEOUS) ×6 IMPLANT
PADDING CAST COTTON 6X4 STRL (CAST SUPPLIES) IMPLANT
SET ARTHROSCOPY TUBING (MISCELLANEOUS) ×2
SET ARTHROSCOPY TUBING LN (MISCELLANEOUS) ×1 IMPLANT
SPONGE LAP 4X18 X RAY DECT (DISPOSABLE) ×3 IMPLANT
SUT ETHILON 2 0 FS 18 (SUTURE) IMPLANT
SYR CONTROL 10ML LL (SYRINGE) ×3 IMPLANT
TOWEL OR 17X24 6PK STRL BLUE (TOWEL DISPOSABLE) ×3 IMPLANT
TOWEL OR 17X26 10 PK STRL BLUE (TOWEL DISPOSABLE) ×3 IMPLANT
TUBE CONNECTING 12'X1/4 (SUCTIONS) ×1
TUBE CONNECTING 12X1/4 (SUCTIONS) ×2 IMPLANT
WATER STERILE IRR 1000ML POUR (IV SOLUTION) ×3 IMPLANT

## 2016-08-30 NOTE — Transfer of Care (Signed)
Immediate Anesthesia Transfer of Care Note  Patient: Joshua Massey  Procedure(s) Performed: Procedure(s): ARTHROSCOPY KNEE (Left)  Patient Location: PACU  Anesthesia Type:MAC, Regional and MAC combined with regional for post-op pain  Level of Consciousness: awake, alert , oriented, patient cooperative and responds to stimulation  Airway & Oxygen Therapy: Patient Spontanous Breathing and Patient connected to nasal cannula oxygen  Post-op Assessment: Report given to RN, Post -op Vital signs reviewed and stable, Patient moving all extremities and Patient moving all extremities X 4  Post vital signs: Reviewed and stable  Last Vitals:  Vitals:   08/30/16 0544 08/30/16 1005  BP: 114/80 112/77  Pulse: 85 80  Resp: 18   Temp: 37 C 37 C    Last Pain:  Vitals:   08/30/16 1005  TempSrc: Oral  PainSc:       Patients Stated Pain Goal: 3 (22/97/98 9211)  Complications: No apparent anesthesia complications

## 2016-08-30 NOTE — Progress Notes (Signed)
Pharmacy Antibiotic Note  Joshua Massey is a 41 y.o. male admitted on 08/16/2016 with anasarca in setting of HF EF 25%.  He had complained of knee pain -was aspirated and found to have GPC in synovial fluid - septic arthritis.   Pharmacy has been consulted for Vancomycin dosing.  Wt 130kg, Cr 2.3, afebrile WBC 12.  Plan: Vancomycin 2500mg  IV x1 then 1250mg  IV q12hr Check levels at SS  Height: 5\' 11"  (180.3 cm) Weight: (!) 308 lb 3.3 oz (139.8 kg) IBW/kg (Calculated) : 75.3  Temp (24hrs), Avg:98.3 F (36.8 C), Min:98 F (36.7 C), Max:98.6 F (37 C)   Recent Labs Lab 08/26/16 0326 08/27/16 0319 08/28/16 0447 08/29/16 0155 08/30/16 0236  WBC 11.5* 11.0*  --   --   --   CREATININE 2.19* 2.10* 2.20* 2.27* 2.38*    Estimated Creatinine Clearance: 59.6 mL/min (by C-G formula based on SCr of 2.38 mg/dL (H)).    No Known Allergies  Antimicrobials this admission: vanc 3/13>  Dose adjustments this admission:   Microbiology results: Synovial fluid GPC  Bonnita Nasuti Pharm.D. CPP, BCPS Clinical Pharmacist [phone] 08/30/2016 11:33 AM

## 2016-08-30 NOTE — H&P (View-Only) (Signed)
ORTHOPAEDIC CONSULTATION  REQUESTING PHYSICIAN: Jonetta Osgood, MD  Chief Complaint: Left knee pain  Assessment: Principal Problem:   Acute on chronic systolic CHF (congestive heart failure) ([place]) Active Problems:   Essential hypertension   CHF (congestive heart failure) (HCC)   Type 2 diabetes mellitus with vascular disease (HCC)   Acute renal failure (HCC)   Acute systolic CHF (congestive heart failure) (HCC)   Peripheral edema   AKI (acute kidney injury) ([place])   CKD (chronic kidney disease) stage 4, GFR 15-29 ml/min (HCC)  Left knee pain with effusion: Nonweightbearing X-ray negative for acute osseous abnormality.  High suspicion for acute gouty arthritis.  Plan: Left knee aspiration, cortisone injection. Ace wrap, ice. Weight Bearing Status: As tolerated  Follow up with Dr. Alain Marion as needed.  Please call with questions.  Procedure: PATIENT:  Joshua Massey   PRE-PROCEDURE DIAGNOSIS:  Left knee pain and effusion POST-OPERATIVE DIAGNOSIS:  Same PROCEDURE:  Aspiration / Intra-articular cortisone injection left knee  PROCEDURE DETAILS:  After informed verbal consent was obtained the superolateral portal of the left knee was prepped with chlorhexidine. Approximately 10 mL of lidocaine were injected superficially and then into the deeper tissues and into the joint space. Flash identified appropriate positioning and pressurized joint space. Medicine was left to sit for several minutes to take effect. Next, a 60 mL syringe with an 18-gauge needle was used to aspirate approximately 80 ml of Cloudy straw colored fluid.  4 ml of Marcaine and 1 ml of Depo-Medrol (40mg ) was then injected into the joint space. He tolerated this well without complication and a Band-Aid was placed.   Charna Elizabeth [place] III, PA-C 08/29/2016 6:30 PM   HPI: Joshua Massey is a 41 y.o. male who complains of 2 days of left knee pain and swelling.   He is uncertain of gout history-he  "may have had some in his foot once."  Pain is diffuse and limits weightbearing and range of motion.  Weightbearing and moving make it worse. Nothing so far has improved his symptoms significantly.  He denies traumatic event. No fever, systemic symptoms, or chills.  He denies feelings of instability in the knee. No catching or locking. No history of surgery on the knee.  Past Medical History:  Diagnosis Date  . 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 Diabetes mellitus    adult-onset, type 2  . Exogenous obesity   . GERD (gastroesophageal reflux disease)   . Hypertension    Past Surgical History:  Procedure Laterality Date  . ANKLE SURGERY  2003   plate and screws  . CARDIAC CATHETERIZATION  02/16/2012   50% LAD lesion in mid vessel, otherwise no significant obstructive CAD  . HIP PINNING  1990   bilateral  . LEFT AND RIGHT HEART CATHETERIZATION WITH CORONARY ANGIOGRAM N/A 02/16/2012   Procedure: LEFT AND RIGHT HEART CATHETERIZATION WITH CORONARY ANGIOGRAM;  Surgeon: Pixie Casino, MD;  Location: [facility] CATH LAB;  Service: Cardiovascular;  Laterality: N/A;  . TRANSTHORACIC ECHOCARDIOGRAM  08/31/2012   EF 35-40%, no significant wall motion abnormalities, diastolic relaxation abnormality - no longer needed LifeVest; LV cavity size mod dilated with mod conc hypertrophy, systolic function mod reduced; mild MR, LA mod dilated   Social History   Social History  . Marital status: Single    Spouse name: N/A  . Number of children: 6  . Years of education: 30   Occupational History  . 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 Smoking status: Never Smoker  . Smokeless tobacco: Never Used  . Alcohol use No  . Drug use: No  . Sexual activity: Yes    Birth control/ protection: Condom   Other Topics Concern  . None   Social History Narrative  . None   Family History  Problem Relation Age  of Onset  . Coronary artery disease Brother     CABG at 30, DM  . Diabetes Mother   . Hypertension Mother   . Diabetes Father    No Known Allergies Prior to Admission medications   Medication Sig Start Date End Date Taking? Authorizing Provider  acetaminophen (TYLENOL) 500 MG tablet Take 1,000 mg by mouth every 6 (six) hours as needed for moderate pain or headache.   Yes Historical Provider, MD  aspirin 81 MG tablet Take 81 mg by mouth daily.   Yes Historical Provider, MD  carvedilol (COREG) 25 MG tablet TAKE ONE TABLET BY MOUTH TWICE DAILY WITH  A  MEAL Patient taking differently: Take 25 mg by mouth 2 (two) times daily with a meal.  10/30/15  Yes Lorretta Harp, MD  digoxin (LANOXIN) 0.25 MG tablet Take 1 tablet (250 mcg total) by mouth daily. 10/30/15  Yes Lorretta Harp, MD  furosemide (LASIX) 40 MG tablet Take 1 tablet (40 mg total) by mouth 2 (two) times daily. 04/06/16  Yes Lorretta Harp, MD  hydrALAZINE (APRESOLINE) 50 MG tablet Take 1 tablet (50 mg total) by mouth 2 (two) times daily. 10/30/15  Yes Lorretta Harp, MD  isosorbide mononitrate (IMDUR) 30 MG 24 hr tablet Take 1 tablet (30 mg total) by mouth daily. 10/30/15  Yes Lorretta Harp, MD  losartan (COZAAR) 100 MG tablet Take 1 tablet (100 mg total) by mouth daily. 10/30/15  Yes Lorretta Harp, MD  spironolactone (ALDACTONE) 25 MG tablet Take 1 tablet (25 mg total) by mouth daily. 10/30/15  Yes Lorretta Harp, MD  Insulin Glargine (LANTUS SOLOSTAR) 100 UNIT/ML Solostar Pen Inject 30 Units into the skin every morning. And pen needles 1/day Patient not taking: Reported on 08/17/2016 12/30/15   Renato Shin, MD  [facility] VERIO test strip USE DAILY Patient not taking: Reported on 08/17/2016 02/12/15   Renato Shin, MD  pantoprazole (PROTONIX) 40 MG tablet TAKE ONE TABLET BY MOUTH ONCE DAILY Patient not taking: Reported on 08/17/2016 12/21/15   Lorretta Harp, MD   Dg Knee 1-2 Views Left  Result Date: 08/29/2016 CLINICAL  DATA:  Pain and swelling. EXAM: LEFT KNEE - 1-2 VIEW COMPARISON:  08/22/2013. FINDINGS: Prominent knee joint effusion noted. No evidence of fracture or dislocation. IMPRESSION: Prominent knee joint effusion.  No acute bony abnormality. Electronically Signed   By: Marcello Moores  Register   On: 08/29/2016 16:32   Positive ROS: All other systems have been reviewed and were otherwise negative with regard to this complaint with the exception of those mentioned in the HPI and as above.  Objective: Labs cbc  Recent Labs  08/27/16 0319  WBC 11.0*  HGB 10.8*  HCT 33.0*  PLT 467*    Recent Labs  08/28/16 0447 08/29/16 0155  NA 133* 133*  K 4.0 4.0  CL 96* 92*  CO2 29 30  GLUCOSE 179* 173*  BUN 43* 45*  CREATININE 2.20* 2.27*  CALCIUM 8.3* 8.4*    Physical Exam:  Vitals:   08/29/16 1028 08/29/16 1332  BP: 128/81 (!) 136/96  Pulse: 85 98  Resp:  20  Temp:  98 F (36.7 C)   General: Alert, no acute distress.  Supine in bed Mental status: Alert and Oriented x3 Neurologic: Speech Clear and organized, no gross focal findings or movement disorder appreciated. Respiratory: No cyanosis, no use of accessory musculature Cardiovascular: RRR GI: Abdomen is protuberant, soft and non-tender, non-distended. Skin: Warm and dry.  No lesions in the area of chief complaint Extremities: Warm and dry Psychiatric: Patient is competent for consent with normal mood and affect  MUSCULOSKELETAL:  Right knee without erythema, lesion, or signs of infection. Palpable effusion. Pain with range of motion. Mild diffuse anterior pain with deep palpation. Stable to varus and valgus stress. Other extremities are atraumatic with painless ROM and NVI.   Prudencio Burly III PA-C 08/29/2016 6:14 PM

## 2016-08-30 NOTE — Interval H&P Note (Signed)
History and Physical Interval Note:  08/30/2016 1:49 PM  Joshua Massey  has presented today for surgery, with the diagnosis of right hip fracture  The various methods of treatment have been discussed with the patient and family. After consideration of risks, benefits and other options for treatment, the patient has consented to  Procedure(s): ARTHROSCOPY KNEE (Left) as a surgical intervention .  The patient's history has been reviewed, patient examined, no change in status, stable for surgery.  I have reviewed the patient's chart and labs.  Questions were answered to the patient's satisfaction.     Oletha Tolson D

## 2016-08-30 NOTE — Anesthesia Procedure Notes (Signed)
Anesthesia Regional Block: Adductor canal block   Pre-Anesthetic Checklist: ,, timeout performed, Correct Patient, Correct Site, Correct Laterality, Correct Procedure, Correct Position, site marked, Risks and benefits discussed,  Surgical consent,  Pre-op evaluation,  At surgeon's request and post-op pain management  Laterality: Left  Prep: chloraprep       Needles:  Injection technique: Single-shot  Needle Type: Echogenic Stimulator Needle     Needle Length: 9cm  Needle Gauge: 21     Additional Needles:   Procedures: ultrasound guided,,,,,,,,  Narrative:  Start time: 08/30/2016 2:29 PM End time: 08/30/2016 2:15 PM Injection made incrementally with aspirations every 5 mL.  Performed by: Personally  Anesthesiologist: Lillia Abed  Additional Notes: Monitors applied. Patient sedated. Sterile prep and drape,hand hygiene and sterile gloves were used. Relevant anatomy identified.Needle position confirmed.Local anesthetic injected incrementally after negative aspiration. Local anesthetic spread visualized around nerve(s). Vascular puncture avoided. No complications. Image printed for medical record.The patient tolerated the procedure well.    Lillia Abed MD

## 2016-08-30 NOTE — Progress Notes (Addendum)
PROGRESS NOTE        PATIENT DETAILS Name: Joshua Massey Age: 41 y.o. Sex: male Date of Birth: 05/08/1976 Admit Date: 08/16/2016 Admitting Physician Rise Patience, MD PCP:Pcp Not In System  Brief Narrative: Joshua Massey is a 41 year old male with history of nonischemic cardiomyopathy, last EF 35-40% in 2014, who presentedto the ERfor evaluation of massive anasarca. He unfortunately was noncompliant to diuretics.Admitted and started on high-dose Lasix.  Subjective: Left knee pain slightly improved  Assessment/Plan: Massive anasarca due to acute on chronic systolic heart failureand diabetic nephropathy:Volume status slowly improving, still has edema upto his thighs. -23.9 L so far, weight has decreased to 308 pounds (365 pounds on admission).Per patient, his baseline weight is around 300 pounds.Continue high-dose Lasix and metolazone-follow electrolytes, weights and intake/output. Continue aspirin, carvedilol, isosorbide, hydralazine, noACEIdue to AKI. CHF team now following.    Left Knee septic arthritis: Initially thought to have acute gouty arthritis-synovial fluid negative for crystals, culture positive for gram-positive cocci. Empirically started on IV vancomycin, orthopedics following and planning on arthroscopy with washout later today. Will discontinue steroids.   Pericardial effusion:No sign of tamponade. Continue diuresis.   Acute renal failure:Not sure if this is due to cardiorenal syndrome, does appear to have significant amount of proteinuria-which is likely due to diabetic nephropathy. Creatinine and electrolytes stable on high-dose Lasix.Continue to monitor. Will likely need to follow with a nephrologist outpatient.   Essential hypertension:Stable. Continuecarvedilol, hydralazine and isosorbide.   Insulin dependent DM Type 2:CBG's stabale with SSI and Lantus 5  units.  Anemia of iron deficiency:Continue Iron  supplement.   Left lower extremity wound:Continue local wound care. Continue tylenol or norco for pain.  DVT Prophylaxis: Prophylactic Heparin   Code Status: Full code   Family Communication: None at bedside  Disposition Plan: Remain inpatient-requires several more days of hospitalization  Antimicrobial agents: Anti-infectives    Start     Dose/Rate Route Frequency Ordered Stop   08/17/16 1000  doxycycline (VIBRAMYCIN) 100 mg in dextrose 5 % 250 mL IVPB  Status:  Discontinued     100 mg 125 mL/hr over 120 Minutes Intravenous Every 12 hours 08/17/16 0235 08/17/16 1307   08/17/16 0015  doxycycline (VIBRA-TABS) tablet 100 mg     100 mg Oral  Once 08/17/16 0002 08/17/16 0107      Procedures: Echocardiogram  - Left ventricle: The cavity size was normal. Wall thickness was increased in a pattern of mild LVH. Systolic function was severely reduced. The estimated ejection fraction was in the range of 25% to 30%. No apical thrombus with Defininty contrast. Diffuse hypokinesis. The study is not technically sufficient to allow evaluation of LV diastolic function. - Ventricular septum: Septal motion showed paradox. The contour showed diastolic flattening and systolic flattening. - Mitral valve: Mildly thickened leaflets . There was trivial regurgitation. - Left atrium: Moderately dilated. - Right ventricle: Poorly visualized. The cavity size was moderately dilated. - Right atrium: Moderately dilated. - Pulmonic valve: There was mild regurgitation. - Pulmonary arteries: PA peak pressure: 56 mm Hg (S). - Inferior vena cava: The vessel was dilated. The respirophasic diameter changes were blunted (<50%), consistent with elevated central  venous pressure. - Pericardium, extracardiac: Cannot exclude tamponade physiology - clinical correlation is advised. - Moderate size pericardial effusion    CONSULTS:  cardiology  Time spent: 25- minutes-Greater than 50%  of this time was spent in counseling, explanation of diagnosis, planning of further management, and coordination of care.  MEDICATIONS: Scheduled Meds: . aspirin EC  81 mg Oral Daily  . carvedilol  25 mg Oral BID WC  . ferrous sulfate  325 mg Oral BID WC  . furosemide  160 mg Intravenous Q8H  . heparin  5,000 Units Subcutaneous Q8H  . hydrALAZINE  50 mg Oral Q8H  . insulin aspart  0-9 Units Subcutaneous TID WC  . insulin glargine  5 Units Subcutaneous QHS  . isosorbide mononitrate  60 mg Oral Daily  . living well with diabetes book   Does not apply Once  . metolazone  5 mg Oral Daily  . pantoprazole  40 mg Oral Daily  . potassium chloride  40 mEq Oral Daily  . predniSONE  40 mg Oral Q breakfast   Continuous Infusions: PRN Meds:.hydrALAZINE, HYDROcodone-acetaminophen, ondansetron **OR** ondansetron (ZOFRAN) IV   PHYSICAL EXAM: Vital signs: Vitals:   08/29/16 2207 08/30/16 0544 08/30/16 0551 08/30/16 1005  BP: 129/85 114/80  112/77  Pulse: 86 85  80  Resp: 18 18    Temp: 98 F (36.7 C) 98.6 F (37 C)  98.6 F (37 C)  TempSrc: Oral Oral  Oral  SpO2: 94% 95%    Weight:   (!) 139.8 kg (308 lb 3.3 oz)   Height:       Filed Weights   08/28/16 0430 08/29/16 0525 08/30/16 0551  Weight: (!) 150.8 kg (332 lb 7.3 oz) (!) 143.2 kg (315 lb 9.6 oz) (!) 139.8 kg (308 lb 3.3 oz)   Body mass index is 42.99 kg/m.   General appearance :Awake, alert, not in any distress. Speech Clear.  Eyes:, pupils equally reactive to light and accomodation,no scleral icterus. HEENT: Atraumatic and Normocephalic Neck: supple, no JVD. No cervical lymphadenopathy. No thyromegaly Resp:Good air entry bilaterally, no added sounds  CVS: S1 S2 regular,  no murmurs.  GI: Bowel sounds present, Non tender and not distended with no gaurding, rigidity or rebound. Extremities: B/L Lower Ext shows ++ edema, both legs are warm to touch Neurology:  speech clear,Non focal, sensation is grossly intact. Psychiatric: Normal judgment and insight. Alert and oriented x 3.  Musculoskeletal:No digital cyanosis   I have personally reviewed following labs and imaging studies  LABORATORY DATA: CBC:  Recent Labs Lab 08/26/16 0326 08/27/16 0319  WBC 11.5* 11.0*  HGB 10.7* 10.8*  HCT 33.5* 33.0*  MCV 87.7 87.3  PLT 469* 467*    Basic Metabolic Panel:  Recent Labs Lab 08/25/16 0454 08/26/16 0326 08/27/16 0319 08/28/16 0447 08/29/16 0155 08/30/16 0236  NA 136 136 134* 133* 133* 134*  K 4.2 4.1 3.8 4.0 4.0 4.5  CL 100* 101 97* 96* 92* 92*  CO2 26 26 27 29 30 30   GLUCOSE 162* 203* 170* 179* 173* 226*  BUN 44* 41* 42* 43* 45* 53*  CREATININE 2.17* 2.19* 2.10* 2.20* 2.27* 2.38*  CALCIUM 8.1* 8.0* 8.2* 8.3* 8.4* 8.2*  MG 1.8 1.8  --  2.0 1.9  --     GFR: Estimated Creatinine Clearance: 59.6 mL/min (by C-G formula based on SCr of 2.38 mg/dL (H)).  Liver Function Tests:  Recent Labs Lab 08/26/16 0326  AST 33  ALT 27  ALKPHOS 348*  BILITOT 1.2  PROT 6.7  ALBUMIN 1.3*   No results for input(s): LIPASE, AMYLASE in the last 168 hours. No results for input(s): AMMONIA in the last 168 hours.  Coagulation Profile:  No results for input(s): INR, PROTIME in the last 168 hours.  Cardiac Enzymes: No results for input(s): CKTOTAL, CKMB, CKMBINDEX, TROPONINI in the last 168 hours.  BNP (last 3 results) No results for input(s): PROBNP in the last 8760 hours.  HbA1C: No results for input(s): HGBA1C in the last 72 hours.  CBG:  Recent Labs Lab 08/29/16 0636 08/29/16 1153 08/29/16 1642 08/29/16 2216 08/30/16 0637  GLUCAP 187* 164* 187* 219* 188*    Lipid Profile: No results for input(s): CHOL, HDL, LDLCALC, TRIG, CHOLHDL,  LDLDIRECT in the last 72 hours.  Thyroid Function Tests: No results for input(s): TSH, T4TOTAL, FREET4, T3FREE, THYROIDAB in the last 72 hours.  Anemia Panel: No results for input(s): VITAMINB12, FOLATE, FERRITIN, TIBC, IRON, RETICCTPCT in the last 72 hours.  Urine analysis:    Component Value Date/Time   COLORURINE AMBER (A) 08/17/2016 0129   APPEARANCEUR HAZY (A) 08/17/2016 0129   LABSPEC 1.016 08/17/2016 0129   PHURINE 5.0 08/17/2016 0129   GLUCOSEU 50 (A) 08/17/2016 0129   HGBUR SMALL (A) 08/17/2016 0129   BILIRUBINUR NEGATIVE 08/17/2016 0129   KETONESUR NEGATIVE 08/17/2016 0129   PROTEINUR >=300 (A) 08/17/2016 0129   UROBILINOGEN 0.2 09/02/2009 0935   NITRITE NEGATIVE 08/17/2016 0129   LEUKOCYTESUR NEGATIVE 08/17/2016 0129    Sepsis Labs: Lactic Acid, Venous No results found for: LATICACIDVEN  MICROBIOLOGY: Recent Results (from the past 240 hour(s))  Gram stain     Status: None   Collection Time: 08/29/16  8:58 AM  Result Value Ref Range Status   Specimen Description SYNOVIAL RIGHT KNEE  Final   Special Requests NONE  Final   Gram Stain   Final    ABUNDANT WBC PRESENT, PREDOMINANTLY PMN NO ORGANISMS SEEN    Report Status 08/29/2016 FINAL  Final  Culture, body fluid-bottle     Status: None (Preliminary result)   Collection Time: 08/29/16  8:58 AM  Result Value Ref Range Status   Specimen Description SYNOVIAL RIGHT KNEE  Final   Special Requests BOTTLES DRAWN AEROBIC AND ANAEROBIC 5CC  Final   Gram Stain   Final    GRAM POSITIVE COCCI IN CHAINS ANAEROBIC BOTTLE ONLY CRITICAL RESULT CALLED TO, READ BACK BY AND VERIFIED WITH: Dionicio Stall RN, AT [phone] 08/30/16 BY D. Victoriano Lain    Culture GRAM POSITIVE COCCI  Final   Report Status PENDING  Incomplete    RADIOLOGY STUDIES/RESULTS: Dg Chest 2 View  Result Date: 08/16/2016 CLINICAL DATA:  Shortness of breath tonight. EXAM: CHEST  2 VIEW COMPARISON:  02/14/2012 FINDINGS: Moderate-to-marked cardiomegaly which is stable.  Central vascular congestion and pulmonary edema. Probable left pleural effusion and minimal fluid in the fissures. No pneumothorax or confluent airspace disease. IMPRESSION: Findings consistent with CHF. Electronically Signed   By: Jeb Levering M.D.   On: 08/16/2016 22:01   Dg Knee 1-2 Views Left  Result Date: 08/29/2016 CLINICAL DATA:  Pain and swelling. EXAM: LEFT KNEE - 1-2 VIEW COMPARISON:  08/22/2013. FINDINGS: Prominent knee joint effusion noted. No evidence of fracture or dislocation. IMPRESSION: Prominent knee joint effusion.  No acute bony abnormality. Electronically Signed   By: Marcello Moores  Register   On: 08/29/2016 16:32   US Renal  Result Date: 08/17/2016 CLINICAL DATA:  Acute onset of renal insufficiency. Initial encounter. EXAM: RENAL / URINARY TRACT ULTRASOUND COMPLETE COMPARISON:  CT of the abdomen and pelvis from 09/02/2009 FINDINGS: Right Kidney: Length: 12.6 cm. Increased parenchymal echogenicity noted. No mass or hydronephrosis visualized. Left Kidney: Length: 11.6 cm.  Increased parenchymal echogenicity noted. No mass or hydronephrosis visualized. Difficult to fully assess due to overlying structures. Bladder: Appears normal for degree of bladder distention. IMPRESSION: 1. No evidence of hydronephrosis. 2. Increased renal parenchymal echogenicity may reflect medical renal disease. Electronically Signed   By: Garald Balding M.D.   On: 08/17/2016 06:39     LOS: 70 days   Joshua Binet, MD  [facility] Pager:[phone]  If 7PM-7AM, please contact night-coverage [email or web address] Password [facility] 08/30/2016, 10:28 AM

## 2016-08-30 NOTE — Anesthesia Preprocedure Evaluation (Addendum)
Anesthesia Evaluation  Patient identified by MRN, date of birth, ID band Patient awake    Reviewed: Allergy & Precautions, NPO status , Patient's Chart, lab work & pertinent test results  Airway Mallampati: II   Neck ROM: Full    Dental   Pulmonary    Pulmonary exam normal        Cardiovascular hypertension, +CHF  Normal cardiovascular exam  ECHO 07/2016 Study Conclusions  - Procedure narrative: Transthoracic echocardiography. Image   quality was adequate. Intravenous contrast (Definity) was   administered. - Left ventricle: The cavity size was normal. Wall thickness was   increased in a pattern of mild LVH. Systolic function was   severely reduced. The estimated ejection fraction was in the   range of 25% to 30%. No apical thrombus with Defininty contrast.   Diffuse hypokinesis. The study is not technically sufficient to   allow evaluation of LV diastolic function. - Ventricular septum: Septal motion showed paradox. The contour   showed diastolic flattening and systolic flattening. - Mitral valve: Mildly thickened leaflets . There was trivial   regurgitation. - Left atrium: Moderately dilated. - Right ventricle: Poorly visualized. The cavity size was   moderately dilated. - Right atrium: Moderately dilated. - Pulmonic valve: There was mild regurgitation. - Pulmonary arteries: PA peak pressure: 56 mm Hg (S). - Inferior vena cava: The vessel was dilated. The respirophasic   diameter changes were blunted (< 50%), consistent with elevated   central venous pressure. - Pericardium, extracardiac: Cannot exclude tamponade physiology -   clinical correlation is advised.  Impressions:  - Compared to a prior study in 2014, the LVEF is lower at 25-30%.   There is now a moderate size pericardial effusion - tamponade   physiology cannot be excluded, especially in the setting of   moderate pulmonary hypertension with signs of  volume and pressure   overload of the RV.   Neuro/Psych    GI/Hepatic GERD  Medicated and Controlled,  Endo/Other  diabetes  Renal/GU Renal InsufficiencyRenal disease     Musculoskeletal   Abdominal   Peds  Hematology   Anesthesia Other Findings   Reproductive/Obstetrics                            Anesthesia Physical Anesthesia Plan  ASA: III  Anesthesia Plan: MAC and Regional   Post-op Pain Management:    Induction: Intravenous  Airway Management Planned: Simple Face Mask  Additional Equipment:   Intra-op Plan:   Post-operative Plan:   Informed Consent: I have reviewed the patients History and Physical, chart, labs and discussed the procedure including the risks, benefits and alternatives for the proposed anesthesia with the patient or authorized representative who has indicated his/her understanding and acceptance.     Plan Discussed with: CRNA and Surgeon  Anesthesia Plan Comments:         Anesthesia Quick Evaluation

## 2016-08-30 NOTE — Anesthesia Procedure Notes (Signed)
Procedure Name: MAC Date/Time: 08/30/2016 3:07 PM Performed by: Jacquiline Doe A Pre-anesthesia Checklist: Patient identified, Emergency Drugs available, Suction available and Patient being monitored Patient Re-evaluated:Patient Re-evaluated prior to inductionOxygen Delivery Method: Nasal cannula and Simple face mask Preoxygenation: Pre-oxygenation with 100% oxygen Intubation Type: IV induction Placement Confirmation: positive ETCO2 Dental Injury: Teeth and Oropharynx as per pre-operative assessment

## 2016-08-30 NOTE — Progress Notes (Signed)
Advanced Heart Failure Rounding Note  PCP: No PCP Primary Cardiologist: Dr. Gwenlyn Found   Subjective:    Admitted on 08/16/16 with acute on chronic combined systolic and diastolic CHF (EF 56-31%), weight gain of about 50 pounds with medical and dietary noncompliance. He has been seen in the past by the CHF team in 2014, but did not follow up. Diuresis sluggish so CHF team was consulted on 08/26/16.   Has diuresed well on Lasix 160mg  q 8 hours + 5mg  Metolazone. Weight down 57 pounds since admission. Creatinine 2.19->2.10->2.20->2.27->2.38  Did not get up yesterday due to knee pain, ortho saw yesterday, did do an aspiration and injected cortisone. Pain is much better today.  However, synovial fluid gram stain and culture showed GPCs concerning for septic arthritis.    Objective:   Weight Range: (!) 308 lb 3.3 oz (139.8 kg) Body mass index is 42.99 kg/m.   Vital Signs:   Temp:  [98 F (36.7 C)-98.6 F (37 C)] 98.6 F (37 C) (03/13 0544) Pulse Rate:  [85-98] 85 (03/13 0544) Resp:  [18-20] 18 (03/13 0544) BP: (114-136)/(80-96) 114/80 (03/13 0544) SpO2:  [93 %-95 %] 95 % (03/13 0544) Weight:  [308 lb 3.3 oz (139.8 kg)] 308 lb 3.3 oz (139.8 kg) (03/13 0551) Last BM Date: 08/28/16  Weight change: Filed Weights   08/28/16 0430 08/29/16 0525 08/30/16 0551  Weight: (!) 332 lb 7.3 oz (150.8 kg) (!) 315 lb 9.6 oz (143.2 kg) (!) 308 lb 3.3 oz (139.8 kg)    Intake/Output:   Intake/Output Summary (Last 24 hours) at 08/30/16 0805 Last data filed at 08/30/16 0650  Gross per 24 hour  Intake                0 ml  Output             2900 ml  Net            -2900 ml     Physical Exam: General:  Obese male, lying in bed. NAD HEENT: normal Neck: supple. JVP 9-10 cm . Carotids 2+ bilat; no bruits. No lymphadenopathy or thyromegaly appreciated. Cor: PMI nondisplaced. Reg. Rate and rhythm. No murmurs, rubs or gallops.  Lungs: CTA in upper lobes, diminished crackles in bases.  Abdomen: soft,  non tender, obese, nondistended. No hepatosplenomegaly. No bruits or masses. Good bowel sounds. Extremities: no cyanosis, clubbing, rash. 1+ edema to knees bilaterally. Left knee still slightly tender to touch, but improved.  Neuro: alert & orientedx3, cranial nerves grossly intact. moves all 4 extremities w/o difficulty. Affect pleasant   Telemetry:NSR in the 80's, but sinus tach in the 120-130's with activity   Labs: Basic Metabolic Panel  Recent Labs  08/28/16 0447 08/29/16 0155 08/30/16 0236  NA 133* 133* 134*  K 4.0 4.0 4.5  CL 96* 92* 92*  CO2 29 30 30   GLUCOSE 179* 173* 226*  BUN 43* 45* 53*  CREATININE 2.20* 2.27* 2.38*  CALCIUM 8.3* 8.4* 8.2*  MG 2.0 1.9  --     BNP: BNP (last 3 results)  Recent Labs  08/16/16 2055  BNP 2,062.6*   Transthoracic Echocardiography 08/17/16 Study Conclusions  - Procedure narrative: Transthoracic echocardiography. Image quality was adequate. Intravenous contrast (Definity) was administered. - Left ventricle: The cavity size was normal. Wall thickness was increased in a pattern of mild LVH. Systolic function was severely reduced. The estimated ejection fraction was in the range of 25% to 30%. No apical thrombus with Defininty contrast. Diffuse hypokinesis.  The study is not technically sufficient to allow evaluation of LV diastolic function. - Ventricular septum: Septal motion showed paradox. The contour showed diastolic flattening and systolic flattening. - Mitral valve: Mildly thickened leaflets . There was trivial regurgitation. - Left atrium: Moderately dilated. - Right ventricle: Poorly visualized. The cavity size was moderately dilated. - Right atrium: Moderately dilated. - Pulmonic valve: There was mild regurgitation. - Pulmonary arteries: PA peak pressure: 56 mm Hg (S). - Inferior vena cava: The vessel was dilated. The respirophasic diameter changes were blunted (<50%), consistent with  elevated central venous pressure. - Pericardium, extracardiac: Cannot exclude tamponade physiology - clinical correlation is advised.  Impressions:  - Compared to a prior study in 2014, the LVEF is lower at 25-30%. There is now a moderate size pericardial effusion - tamponade physiology cannot be excluded, especially in the setting of moderate pulmonary hypertension with signs of volume and pressure overload of the RV.   Medications:     Scheduled Medications: . aspirin EC  81 mg Oral Daily  . carvedilol  25 mg Oral BID WC  . ferrous sulfate  325 mg Oral BID WC  . furosemide  160 mg Intravenous Q8H  . heparin  5,000 Units Subcutaneous Q8H  . hydrALAZINE  50 mg Oral Q8H  . insulin aspart  0-9 Units Subcutaneous TID WC  . insulin glargine  5 Units Subcutaneous QHS  . isosorbide mononitrate  60 mg Oral Daily  . living well with diabetes book   Does not apply Once  . metolazone  5 mg Oral Daily  . pantoprazole  40 mg Oral Daily  . potassium chloride  40 mEq Oral Daily  . predniSONE  40 mg Oral Q breakfast    PRN Medications: hydrALAZINE, HYDROcodone-acetaminophen, ondansetron **OR** ondansetron (ZOFRAN) IV   Assessment/Plan   1. Acute on chronic combined systolic and diastolic CHF: NYHA IIIB, first diagnosed in 2013,EF 25-30% this admission, presented with volume overload. Currently on IV Lasix 160mg  q 8 hours. Weight down 57 pounds from admission, volume status improving but still overloaded.  NICM - had normal cors in 2015, LVH on Echo this admission, but cavity size normal.  - Decrease Lasix to 160mg  IV BID.  - Continue 5mg  metolazone today.  - Continue Imdur to 60 mg daily.  - Continue hydralazine 50mg  TID.   - Continue Coreg 25mg  BID.  - Will not be able to use Dig, Arlyce Harman or [place] with elevated creatinine.  2. Acute on chronic kidney disease: of note, in 2014 Creatinine was 1.04, on admit creatinine is 2.49. Renal ultrasound on 2/28 without evidence  of hydronephrosis.  Likely CKD rather than AKI. Does not currently have a nephrologist. - Creatinine is 2.38 today. Improved from admission creatinine which was 2.49. Will follow with daily BMET.   - No ACE or ARB currently, avoiding nephrotoxic agents.  3. Uncontrolled DM type 2: A1c is 11.2.  - Needs PCP to manage.  - Continue SSI - Discussed diet modifications, limiting carbohydrates in his snacks.  4. HTN: BP well controlled.  5. Noncompliance: Discussed with patient the importance of adhering to his medication regimen.  6. Morbid Obesity Body mass index is 48.61 kg/m.  - Had discussion this am about remaining active while he is in the hospital. He did not get up yesterday, but will increase activity today.  7. Moderate sized pericardial effusion: Had moderate pericardial effusion by Echo, no tamponade symptoms currently. Did have low voltage on EKG which was new when compared  to prior EKG's. 8. Left knee pain: Ortho saw yesterday, he did get a cortisone injection as well as fluid aspiration.  Synovial fluid cultures growing GPCs, suspect septic arthritis.  He says pain is much better this am.  - Ortho planning on taking him to OR for arthroscopy with wash-out.   Length of Stay: [place], NP  08/30/2016, 8:05 AM  Advanced Heart Failure Team Pager [phone] (M-F; 7a - 4p)  Please contact [facility] for night-coverage after hours (4p -7a ) and weekends on [email or web address]  Patient seen with NP, agree with the above note.   Still with volume overload though weight continues to fall. Creatinine fairly stable though BUN starting to rise.  - Continue diuresis today but will slow it a bit with decrease in Lasix to 160 mg IV bid.  Continue metolazone.  - Continue current hydralazine, Imdur, and Coreg.   Left knee septic arthritis.  Source likely from left lower leg wounds.  He is on vancomycin and prednisone was stopped.  Ortho recommends arthroscopy with washout in the OR today.  I  think patient is stable for operation.  The procedure itself is low risk though patient has significant CHF with low EF.  CHF is much improved with IV diuresis.  He still remains at moderate risk but needs procedure done given septic arthritis.    Loralie Champagne 08/30/2016 11:41 AM

## 2016-08-30 NOTE — Anesthesia Postprocedure Evaluation (Signed)
Anesthesia Post Note  Patient: Joshua Massey  Procedure(s) Performed: Procedure(s) (LRB): ARTHROSCOPY KNEE (Left)  Anesthesia Type: Regional       Last Vitals:  Vitals:   08/30/16 1647 08/30/16 1724  BP: 134/87 130/84  Pulse: 83   Resp:    Temp:      Last Pain:  Vitals:   08/30/16 1647  TempSrc:   PainSc: 6                  Ruairi Stutsman DAVID

## 2016-08-30 NOTE — Plan of Care (Signed)
Problem: Health Behavior/Discharge Planning: Goal: Ability to manage health-related needs will improve Outcome: Not Progressing Even though patient is repeatedly encouraged to ambulate patient is refusing

## 2016-08-30 NOTE — Op Note (Signed)
08/16/2016 - 08/30/2016  3:39 PM  PATIENT:  Joshua Massey    PRE-OPERATIVE DIAGNOSIS:  Septic Left Knee  POST-OPERATIVE DIAGNOSIS:  Same  PROCEDURE:  ARTHROSCOPY KNEE  SURGEON:  MURPHY, Ernesta Amble, MD  ASSISTANT: Roxan Hockey, PA-C, he was present and scrubbed throughout the case, critical for completion in a timely fashion, and for retraction, instrumentation, and closure.   ANESTHESIA:   General  BLOOD LOSS: min  COMPLICATIONS: None   PREOPERATIVE INDICATIONS:  Joshua Massey is a  41 y.o. male with a diagnosis of Septic Left Knee who failed conservative measures and elected for surgical management.    The risks benefits and alternatives were discussed with the patient preoperatively including but not limited to the risks of infection, bleeding, nerve injury, cardiopulmonary complications, the need for revision surgery, among others, and the patient was willing to proceed.  OPERATIVE IMPLANTS: none  OPERATIVE FINDINGS: Diagnostic Arthroscopy:  Lateral meniscal tear Purulent fluid Partial ACL injury Lateral chondromalacia    OPERATIVE PROCEDURE:  Patient was identified in the preoperative holding area and site was marked by me male was transported to the operating theater and placed on the table in supine position taking care to pad all bony prominences. After a preincinduction time out anesthesia was induced.  male received Vanc for preoperative antibiotics. The left lower extremity was prepped and draped in normal sterile fashion and a pre-incision timeout was performed.   A small stab incision was made in the anterolateral portal position. The arthroscope was introduced in the joint. A medial portal was then established under direct visualization just above the anterior horn of the medial meniscus. Diagnostic arthroscopy was then carried out with findings as described above.  Purulent fluid was collected for culture prior to Vanc dosing  I copiously irrigated the  joint with Scope fluid  I debrided synovium  I perfomed a partial lateral meniscectomy.  I perfomed a lateral Chondroplasty.   The arthroscopic equipment was removed from the joint and the portals were closed with 3-0 nylon in an interrupted fashion.  Sterile dressings were then applied including Xeroform 4 x 4's ABDs an ACE bandage.  The patient was then allowed to awaken from general anesthesia, transferred to the stretcher and taken to the recovery room in stable condition.  POSTOPERATIVE PLAN: The patient will be discharged home today and will followup in one week for suture removal and wound check.  VTE prophylaxis: Per primary team

## 2016-08-30 NOTE — Progress Notes (Signed)
PT Cancellation Note  Patient Details Name: Joshua Massey MRN: [id number] DOB: 02-16-1976   Cancelled Treatment:    Reason Eval/Treat Not Completed: Patient declined, no reason specified (pt awaiting left knee arthroscopy today and politely declined mobility until after procedure. Will attempt next date)   Marsena Taff B Princess Karnes 08/30/2016, 12:24 PM  Elwyn Reach, [place]

## 2016-08-31 ENCOUNTER — Encounter (HOSPITAL_COMMUNITY): Payer: Self-pay | Admitting: Orthopedic Surgery

## 2016-08-31 ENCOUNTER — Other Ambulatory Visit (HOSPITAL_COMMUNITY): Payer: Self-pay

## 2016-08-31 DIAGNOSIS — Z8249 Family history of ischemic heart disease and other diseases of the circulatory system: Secondary | ICD-10-CM

## 2016-08-31 DIAGNOSIS — Z9889 Other specified postprocedural states: Secondary | ICD-10-CM

## 2016-08-31 DIAGNOSIS — E1165 Type 2 diabetes mellitus with hyperglycemia: Secondary | ICD-10-CM

## 2016-08-31 DIAGNOSIS — E669 Obesity, unspecified: Secondary | ICD-10-CM

## 2016-08-31 DIAGNOSIS — M00262 Other streptococcal arthritis, left knee: Secondary | ICD-10-CM

## 2016-08-31 DIAGNOSIS — B950 Streptococcus, group A, as the cause of diseases classified elsewhere: Secondary | ICD-10-CM

## 2016-08-31 DIAGNOSIS — Z833 Family history of diabetes mellitus: Secondary | ICD-10-CM

## 2016-08-31 DIAGNOSIS — E1122 Type 2 diabetes mellitus with diabetic chronic kidney disease: Secondary | ICD-10-CM

## 2016-08-31 DIAGNOSIS — Z95828 Presence of other vascular implants and grafts: Secondary | ICD-10-CM

## 2016-08-31 DIAGNOSIS — I130 Hypertensive heart and chronic kidney disease with heart failure and stage 1 through stage 4 chronic kidney disease, or unspecified chronic kidney disease: Principal | ICD-10-CM

## 2016-08-31 DIAGNOSIS — Z794 Long term (current) use of insulin: Secondary | ICD-10-CM

## 2016-08-31 LAB — BASIC METABOLIC PANEL
Anion gap: 12 (ref 5–15)
BUN: 59 mg/dL — AB (ref 6–20)
CALCIUM: 8 mg/dL — AB (ref 8.9–10.3)
CO2: 28 mmol/L (ref 22–32)
Chloride: 90 mmol/L — ABNORMAL LOW (ref 101–111)
Creatinine, Ser: 2.49 mg/dL — ABNORMAL HIGH (ref 0.61–1.24)
GFR calc Af Amer: 36 mL/min — ABNORMAL LOW (ref >60)
GFR, EST NON AFRICAN AMERICAN: 31 mL/min — AB (ref >60)
Glucose, Bld: 241 mg/dL — ABNORMAL HIGH (ref 65–99)
Potassium: 5 mmol/L (ref 3.5–5.1)
Sodium: 130 mmol/L — ABNORMAL LOW (ref 135–145)

## 2016-08-31 LAB — GLUCOSE, CAPILLARY
Glucose-Capillary: 224 mg/dL — ABNORMAL HIGH (ref 65–99)
Glucose-Capillary: 202 mg/dL — ABNORMAL HIGH (ref 65–99)
Glucose-Capillary: 276 mg/dL — ABNORMAL HIGH (ref 65–99)
Glucose-Capillary: 210 mg/dL — ABNORMAL HIGH (ref 65–99)

## 2016-08-31 MED ORDER — VANCOMYCIN HCL 10 G IV SOLR
1500.0000 mg | INTRAVENOUS | Status: DC
  Filled 2016-08-31: qty 1500

## 2016-08-31 MED ORDER — PENICILLIN G POTASSIUM 5000000 UNITS IJ SOLR
2.0000 10*6.[IU] | INTRAVENOUS | Status: DC
  Administered 2016-08-31 – 2016-09-01 (×5): 2 10*6.[IU] via INTRAVENOUS
  Filled 2016-08-31 (×7): qty 2

## 2016-08-31 NOTE — Progress Notes (Signed)
itinial CHP contact with pt here @ [facility].  Pt was admitted due to CHF.  Pt was in the room alone, awake and CAO x4. Pt stated that he does take his medications "most of the time".  Pt stated that he does not weigh daily due to his kids breaking his scales. Pt stated that he has a wife and two small kids (age 40 and 86) at home.  I explained the program to pt and he agreed to allow me to visit.  Pt is not currently using a pill box but he stated that he "will try to it". Pt signed the HIPPA form.  **I spoke with pt's nurse whom stated that he has worked several shifts while pt has been admitted and he hasn't seen his wife visit.

## 2016-08-31 NOTE — Progress Notes (Signed)
Advanced Heart Failure Rounding Note  PCP: No PCP Primary Cardiologist: Dr. Gwenlyn Found   Subjective:    Admitted on 08/16/16 with acute on chronic combined systolic and diastolic CHF (EF 22-29%), weight gain of about 50 pounds with medical and dietary noncompliance. He has been seen in the past by the CHF team in 2014, but did not follow up. Diuresis sluggish so CHF team was consulted on 08/26/16.   Has diuresed well on IV Lasix + metolazone.  Weight down 57 pounds since admission. Creatinine 2.19->2.10->2.20->2.27->2.38->2.49. IV lasix decreased yesterday to 160mg  BID + metolazone was continued.   Yesterday, patient had left knee arthroscopy for septic knee, synovial fluid growing Strep pyogenes. Having post op pain this morning. Denies SOB. Needs to mobilize today. No weight recorded today.   Objective:   Weight Range: (!) 308 lb 3.3 oz (139.8 kg) Body mass index is 42.99 kg/m.   Vital Signs:   Temp:  [97.6 F (36.4 C)-98.6 F (37 C)] 97.8 F (36.6 C) (03/14 0614) Pulse Rate:  [80-103] 85 (03/14 0614) Resp:  [13-19] 18 (03/14 0614) BP: (108-145)/(68-100) 139/100 (03/14 0614) SpO2:  [94 %-100 %] 96 % (03/14 0614) Last BM Date: 08/30/16  Weight change: Filed Weights   08/28/16 0430 08/29/16 0525 08/30/16 0551  Weight: (!) 332 lb 7.3 oz (150.8 kg) (!) 315 lb 9.6 oz (143.2 kg) (!) 308 lb 3.3 oz (139.8 kg)    Intake/Output:   Intake/Output Summary (Last 24 hours) at 08/31/16 0735 Last data filed at 08/31/16 0618  Gross per 24 hour  Intake             1656 ml  Output             1235 ml  Net              421 ml     Physical Exam: General: Obese male in NAD.  HEENT: normal, poor dentition  Neck: supple. JVP 8 cm. Carotids 2+ bilat; no bruits. No lymphadenopathy or thyromegaly appreciated. Cor: PMI nondisplaced. Regular rate and rhythm. No murmurs, rubs or gallops.  Lungs: Clear in upper lobes, diminished crackles in bilateral lower lobes.   Abdomen: soft, non tender,  obese, nondistended. No hepatosplenomegaly. No bruits or masses. Good bowel sounds. Extremities: no cyanosis, clubbing, rash. 1+ ankle edema. Left knee in ace wrap.  Neuro: alert & orientedx3, cranial nerves grossly intact. moves all 4 extremities w/o difficulty. Affect pleasant   Telemetry (personally reviewed): NSR in the 80's   Labs: Basic Metabolic Panel  Recent Labs  08/29/16 0155 08/30/16 0236 08/31/16 0148  NA 133* 134* 130*  K 4.0 4.5 5.0  CL 92* 92* 90*  CO2 30 30 28   GLUCOSE 173* 226* 241*  BUN 45* 53* 59*  CREATININE 2.27* 2.38* 2.49*  CALCIUM 8.4* 8.2* 8.0*  MG 1.9  --   --     BNP: BNP (last 3 results)  Recent Labs  08/16/16 2055  BNP 2,062.6*   Transthoracic Echocardiography 08/17/16 Study Conclusions  - Procedure narrative: Transthoracic echocardiography. Image quality was adequate. Intravenous contrast (Definity) was administered. - Left ventricle: The cavity size was normal. Wall thickness was increased in a pattern of mild LVH. Systolic function was severely reduced. The estimated ejection fraction was in the range of 25% to 30%. No apical thrombus with Defininty contrast. Diffuse hypokinesis. The study is not technically sufficient to allow evaluation of LV diastolic function. - Ventricular septum: Septal motion showed paradox. The contour showed  diastolic flattening and systolic flattening. - Mitral valve: Mildly thickened leaflets . There was trivial regurgitation. - Left atrium: Moderately dilated. - Right ventricle: Poorly visualized. The cavity size was moderately dilated. - Right atrium: Moderately dilated. - Pulmonic valve: There was mild regurgitation. - Pulmonary arteries: PA peak pressure: 56 mm Hg (S). - Inferior vena cava: The vessel was dilated. The respirophasic diameter changes were blunted (<50%), consistent with elevated central venous pressure. - Pericardium, extracardiac: Cannot exclude tamponade  physiology  clinical correlation is advised.  Impressions:  - Compared to a prior study in 2014, the LVEF is lower at 25-30%. There is now a moderate size pericardial effusion - tamponade physiology cannot be excluded, especially in the setting of moderate pulmonary hypertension with signs of volume and pressure overload of the RV.   Medications:     Scheduled Medications: . aspirin EC  81 mg Oral Daily  . carvedilol  25 mg Oral BID WC  . ferrous sulfate  325 mg Oral BID WC  . furosemide  160 mg Intravenous BID  . heparin  5,000 Units Subcutaneous Q8H  . hydrALAZINE  50 mg Oral Q8H  . insulin aspart  0-9 Units Subcutaneous TID WC  . insulin glargine  5 Units Subcutaneous QHS  . isosorbide mononitrate  60 mg Oral Daily  . living well with diabetes book   Does not apply Once  . metolazone  5 mg Oral Daily  . pantoprazole  40 mg Oral Daily  . potassium chloride  40 mEq Oral Daily  . vancomycin  2,500 mg Intravenous Once    PRN Medications: fentaNYL (SUBLIMAZE) injection, hydrALAZINE, HYDROcodone-acetaminophen, midazolam, morphine injection, ondansetron **OR** ondansetron (ZOFRAN) IV   Assessment/Plan   1. Acute on chronic combined systolic and diastolic CHF: NYHA IIIB, first diagnosed in 2013,EF 25-30% this admission, presented with volume overload. Currently on IV Lasix 160mg  BID. Weight down 57 pounds from admission, volume status improved and now BUN/creatinine rising.  NICM - had normal cors in 2015, LVH on Echo this admission, but cavity size normal.  - He got Lasix and metolazone this morning, stop now.   If creatinine stable, start torsemide tomorrow.  - Continue Imdur to 60 mg daily.  - Continue hydralazine 50 mg TID.   - Continue Coreg 25 mg BID.  - Will not be able to use Dig, Arlyce Harman or [place] with elevated creatinine.  2. AKI on ?CKD: of note, in 2014 Creatinine was 1.04, on admit creatinine is 2.49. Renal ultrasound on 2/28 without evidence of  hydronephrosis. Does not currently have a nephrologist. - Creatinine is 2.49 today, BUN continues to rise up to 59 today. As above, stopping IV diuretics.  Will see what BUN/creatinine look like tomorrow before starting po diuretic.  - K is up to 5 today, stop K supplement.  - Will follow with daily BMET.   - No ACE or ARB currently, avoiding nephrotoxic agents.  3. Uncontrolled DM type 2: A1c is 11.2.  - Needs PCP to manage.  - Continue SSI - Discussed diet modifications, limiting carbohydrates in his snacks.  4. HTN: Well controlled on current regimen.  5. Noncompliance: Discussed with patient the importance of adhering to his medication regimen.  6. Morbid Obesity: Body mass index is 48.61 kg/m. We discussed the need for efforts at weight loss at home.  7. Moderate sized pericardial effusion: Had moderate pericardial effusion by Echo but no tamponade.  Hopefully this is improving with diuresis.  8. Left septic arthritis: s/p left  knee arthroscopy and wash out. Now growing Strep pyogenes.  He is on vancomycin, can narrow spectrum when sensibilities available.   Length of Stay: [place], NP  08/31/2016, 7:35 AM  Advanced Heart Failure Team Pager [phone] (M-F; 7a - 4p)  Please contact [facility] for night-coverage after hours (4p -7a ) and weekends on [email or web address]  Patient seen with NP, agree with the above note.  Volume looks much improved on exam today, weight down 67 lbs total.  He probably has some residual volume overload but now BUN and creatinine have started to rise.  Would stop metolazone and IV Lasix today.  If creatinine stable tomorrow, can start torsemide.   K is high normal, stop supplemental K.   Strep pyogenes septic arthritis.  S/p arthroscopy/washout.  Will continue vancomycin, can narrow spectrum when sensitivities return.   Hopefully home later this week.  Needs to get out of bed.   Loralie Champagne 08/31/2016 12:42 PM

## 2016-08-31 NOTE — Progress Notes (Signed)
PROGRESS NOTE        PATIENT DETAILS Name: Joshua Massey Age: 41 y.o. Sex: male Date of Birth: [birth year] Admit Date: 08/16/2016 Admitting Physician Rise Patience, MD PCP:Pcp Not In System  Brief Narrative: Joshua Massey is a 41 year old male with history of nonischemic cardiomyopathy, last EF 35-40% in 2014, who presentedto the ERfor evaluation of massive anasarca. He unfortunately was noncompliant to diuretics.Admitted and started on high-dose Lasix.  Subjective: Continues to have some pain in his left knee  Assessment/Plan: Massive anasarca due to acute on chronic systolic heart failureand diabetic nephropathy:Volume status slowly improving, still has edema upto his thighs. -23.9 L so far, weight has decreased to 308 pounds (365 pounds on admission).Per patient, his baseline weight is around 300 pounds.Continue high-dose Lasix and metolazone-follow electrolytes, weights and intake/output. Continue aspirin, carvedilol, isosorbide, hydralazine, noACEIdue to AKI. CHF team now following.    Left Knee septic arthritis: Initially thought to have acute gouty arthritis-synovial fluid negative for crystals, culture positive for gram-positive cocci. Empirically started on IV vancomycin, orthopedics performed left knee washout on 3/13. Have consulted infectious disease today.   Pericardial effusion:No sign of tamponade. Continue diuresis.   Acute renal failure:Not sure if this is due to cardiorenal syndrome, does appear to have significant amount of proteinuria-which is likely due to diabetic nephropathy. Creatinine and electrolytes stable on high-dose Lasix.Continue to monitor. Will likely need to follow with a nephrologist outpatient.   Essential hypertension:Stable. Continuecarvedilol, hydralazine and isosorbide.   Insulin dependent DM Type 2:CBG's stabale with SSI and Lantus 5  units.  Anemia of iron deficiency:Continue Iron supplement.    Left lower extremity wound:Continue local wound care. Continue tylenol or norco for pain.  DVT Prophylaxis: Prophylactic Heparin   Code Status: Full code   Family Communication: None at bedside  Disposition Plan: Remain inpatient-requires several more days of hospitalization  Antimicrobial agents: Anti-infectives    Start     Dose/Rate Route Frequency Ordered Stop   08/30/16 2300  vancomycin (VANCOCIN) 1,250 mg in sodium chloride 0.9 % 250 mL IVPB  Status:  Discontinued     1,250 mg 166.7 mL/hr over 90 Minutes Intravenous Every 12 hours 08/30/16 1129 08/30/16 1730   08/30/16 1545  vancomycin (VANCOCIN) 1,500 mg in sodium chloride 0.9 % 250 mL IVPB  Status:  Discontinued     1,500 mg 125 mL/hr over 120 Minutes Intravenous To Surgery 08/30/16 1530 08/30/16 1538   08/30/16 1545  vancomycin (VANCOCIN) 1,500 mg in sodium chloride 0.9 % 500 mL IVPB     1,500 mg 250 mL/hr over 120 Minutes Intravenous To Surgery 08/30/16 1536 08/30/16 1745   08/30/16 1130  vancomycin (VANCOCIN) 2,500 mg in sodium chloride 0.9 % 500 mL IVPB     2,500 mg 250 mL/hr over 120 Minutes Intravenous  Once 08/30/16 1129     08/17/16 1000  doxycycline (VIBRAMYCIN) 100 mg in dextrose 5 % 250 mL IVPB  Status:  Discontinued     100 mg 125 mL/hr over 120 Minutes Intravenous Every 12 hours 08/17/16 0235 08/17/16 1307   08/17/16 0015  doxycycline (VIBRA-TABS) tablet 100 mg     100 mg Oral  Once 08/17/16 0002 08/17/16 0107      Procedures: Echocardiogram  - Left ventricle: The cavity size was normal. Wall thickness was increased in a pattern of mild LVH. Systolic function  was severely reduced. The estimated ejection fraction was in the range of 25% to 30%. No apical thrombus with Defininty contrast. Diffuse hypokinesis. The study is not technically sufficient to allow evaluation of LV diastolic function. -  Ventricular septum: Septal motion showed paradox. The contour showed diastolic flattening and systolic flattening. - Mitral valve: Mildly thickened leaflets . There was trivial regurgitation. - Left atrium: Moderately dilated. - Right ventricle: Poorly visualized. The cavity size was moderately dilated. - Right atrium: Moderately dilated. - Pulmonic valve: There was mild regurgitation. - Pulmonary arteries: PA peak pressure: 56 mm Hg (S). - Inferior vena cava: The vessel was dilated. The respirophasic diameter changes were blunted (<50%), consistent with elevated central venous pressure. - Pericardium, extracardiac: Cannot exclude tamponade physiology - clinical correlation is advised. - Moderate size pericardial effusion    CONSULTS:  cardiology  Time spent: 25- minutes-Greater than 50% of this time was spent in counseling, explanation of diagnosis, planning of further management, and coordination of care.  MEDICATIONS: Scheduled Meds: . aspirin EC  81 mg Oral Daily  . carvedilol  25 mg Oral BID WC  . ferrous sulfate  325 mg Oral BID WC  . furosemide  160 mg Intravenous BID  . heparin  5,000 Units Subcutaneous Q8H  . hydrALAZINE  50 mg Oral Q8H  . insulin aspart  0-9 Units Subcutaneous TID WC  . insulin glargine  5 Units Subcutaneous QHS  . isosorbide mononitrate  60 mg Oral Daily  . living well with diabetes book   Does not apply Once  . metolazone  5 mg Oral Daily  . pantoprazole  40 mg Oral Daily  . potassium chloride  40 mEq Oral Daily  . vancomycin  2,500 mg Intravenous Once   Continuous Infusions: . lactated ringers 50 mL/hr at 08/30/16 1303   PRN Meds:.fentaNYL (SUBLIMAZE) injection, hydrALAZINE, HYDROcodone-acetaminophen, midazolam, morphine injection, ondansetron **OR** ondansetron  (ZOFRAN) IV   PHYSICAL EXAM: Vital signs: Vitals:   08/30/16 1758 08/30/16 1830 08/30/16 2056 08/31/16 0614  BP: 137/85 116/69 108/68 (!) 139/100  Pulse:   (!) 103 85  Resp:   18 18  Temp:   97.6 F (36.4 C) 97.8 F (36.6 C)  TempSrc:   Oral Axillary  SpO2:   94% 96%  Weight:      Height:       Filed Weights   08/28/16 0430 08/29/16 0525 08/30/16 0551  Weight: (!) 150.8 kg (332 lb 7.3 oz) (!) 143.2 kg (315 lb 9.6 oz) (!) 139.8 kg (308 lb 3.3 oz)   Body mass index is 42.99 kg/m.   General appearance :Awake, alert, not in any distress. Speech Clear.  Eyes:, pupils equally reactive to light and accomodation,no scleral icterus. HEENT: Atraumatic and Normocephalic Neck: supple, no JVD. No cervical lymphadenopathy. No thyromegaly Resp:Good air entry bilaterally, no added sounds  CVS: S1 S2 regular, no murmurs.  GI: Bowel sounds present, Non tender and not distended with no gaurding, rigidity or rebound. Extremities: B/L Lower Ext shows ++ edema, both legs are warm to touch. Left knee area wrapped-did not open. Neurology:  speech clear,Non focal, sensation is grossly intact. Psychiatric: Normal judgment and insight. Alert and oriented x 3.  Musculoskeletal:No digital cyanosis   I have personally reviewed following labs and imaging studies  LABORATORY DATA: CBC:  Recent Labs Lab 08/26/16 0326 08/27/16 0319  WBC 11.5* 11.0*  HGB 10.7* 10.8*  HCT 33.5* 33.0*  MCV 87.7 87.3  PLT 469* 467*    Basic Metabolic  Panel:  Recent Labs Lab 08/25/16 0454 08/26/16 0326 08/27/16 0319 08/28/16 0447 08/29/16 0155 08/30/16 0236 08/31/16 0148  NA 136 136 134* 133* 133* 134* 130*  K 4.2 4.1 3.8 4.0 4.0 4.5 5.0  CL 100* 101 97* 96* 92* 92* 90*  CO2 26 26 27 29 30 30 28   GLUCOSE 162* 203* 170* 179* 173* 226* 241*  BUN 44* 41* 42* 43* 45* 53* 59*  CREATININE 2.17* 2.19* 2.10* 2.20* 2.27* 2.38* 2.49*  CALCIUM 8.1* 8.0* 8.2* 8.3* 8.4* 8.2* 8.0*  MG 1.8 1.8  --  2.0 1.9  --    --     GFR: Estimated Creatinine Clearance: 57 mL/min (by C-G formula based on SCr of 2.49 mg/dL (H)).  Liver Function Tests:  Recent Labs Lab 08/26/16 0326  AST 33  ALT 27  ALKPHOS 348*  BILITOT 1.2  PROT 6.7  ALBUMIN 1.3*   No results for input(s): LIPASE, AMYLASE in the last 168 hours. No results for input(s): AMMONIA in the last 168 hours.  Coagulation Profile: No results for input(s): INR, PROTIME in the last 168 hours.  Cardiac Enzymes: No results for input(s): CKTOTAL, CKMB, CKMBINDEX, TROPONINI in the last 168 hours.  BNP (last 3 results) No results for input(s): PROBNP in the last 8760 hours.  HbA1C: No results for input(s): HGBA1C in the last 72 hours.  CBG:  Recent Labs Lab 08/30/16 1329 08/30/16 1555 08/30/16 1649 08/30/16 2058 08/31/16 0558  GLUCAP 236* 238* 211* 262* 224*    Lipid Profile: No results for input(s): CHOL, HDL, LDLCALC, TRIG, CHOLHDL, LDLDIRECT in the last 72 hours.  Thyroid Function Tests: No results for input(s): TSH, T4TOTAL, FREET4, T3FREE, THYROIDAB in the last 72 hours.  Anemia Panel: No results for input(s): VITAMINB12, FOLATE, FERRITIN, TIBC, IRON, RETICCTPCT in the last 72 hours.  Urine analysis:    Component Value Date/Time   COLORURINE AMBER (A) 08/17/2016 0129   APPEARANCEUR HAZY (A) 08/17/2016 0129   LABSPEC 1.016 08/17/2016 0129   PHURINE 5.0 08/17/2016 0129   GLUCOSEU 50 (A) 08/17/2016 0129   HGBUR SMALL (A) 08/17/2016 0129   BILIRUBINUR NEGATIVE 08/17/2016 0129   KETONESUR NEGATIVE 08/17/2016 0129   PROTEINUR >=300 (A) 08/17/2016 0129   UROBILINOGEN 0.2 09/02/2009 0935   NITRITE NEGATIVE 08/17/2016 0129   LEUKOCYTESUR NEGATIVE 08/17/2016 0129    Sepsis Labs: Lactic Acid, Venous No results found for: LATICACIDVEN  MICROBIOLOGY: Recent Results (from the past 240 hour(s))  Gram stain     Status: None   Collection Time: 08/29/16  8:58 AM  Result Value Ref Range Status   Specimen Description  SYNOVIAL RIGHT KNEE  Final   Special Requests NONE  Final   Gram Stain   Final    ABUNDANT WBC PRESENT, PREDOMINANTLY PMN NO ORGANISMS SEEN    Report Status 08/29/2016 FINAL  Final  Culture, body fluid-bottle     Status: None (Preliminary result)   Collection Time: 08/29/16  8:58 AM  Result Value Ref Range Status   Specimen Description SYNOVIAL RIGHT KNEE  Final   Special Requests BOTTLES DRAWN AEROBIC AND ANAEROBIC 5CC  Final   Gram Stain   Final    GRAM POSITIVE COCCI IN CHAINS IN BOTH AEROBIC AND ANAEROBIC BOTTLES CRITICAL RESULT CALLED TO, READ BACK BY AND VERIFIED WITH: Dionicio Stall RN, AT [phone] 08/30/16 BY D. Victoriano Lain    Culture GRAM POSITIVE COCCI  Final   Report Status PENDING  Incomplete  Gram stain     Status: None  Collection Time: 08/30/16  3:18 PM  Result Value Ref Range Status   Specimen Description SYNOVIAL LEFT KNEE  Final   Special Requests NONE  Final   Gram Stain   Final    ABUNDANT WBC PRESENT, PREDOMINANTLY PMN RARE GRAM POSITIVE COCCI IN PAIRS    Report Status 08/30/2016 FINAL  Final  Culture, body fluid-bottle     Status: None (Preliminary result)   Collection Time: 08/30/16  3:18 PM  Result Value Ref Range Status   Specimen Description SYNOVIAL LEFT KNEE  Final   Special Requests BOTTLES DRAWN AEROBIC AND ANAEROBIC 10CC  Final   Gram Stain   Final    GRAM POSITIVE COCCI IN PAIRS IN CHAINS IN BOTH AEROBIC AND ANAEROBIC BOTTLES    Culture PENDING  Incomplete   Report Status PENDING  Incomplete    RADIOLOGY STUDIES/RESULTS: Dg Chest 2 View  Result Date: 08/16/2016 CLINICAL DATA:  Shortness of breath tonight. EXAM: CHEST  2 VIEW COMPARISON:  02/14/2012 FINDINGS: Moderate-to-marked cardiomegaly which is stable. Central vascular congestion and pulmonary edema. Probable left pleural effusion and minimal fluid in the fissures. No pneumothorax or confluent airspace disease. IMPRESSION: Findings consistent with CHF. Electronically Signed   By: Jeb Levering  M.D.   On: 08/16/2016 22:01   Dg Knee 1-2 Views Left  Result Date: 08/29/2016 CLINICAL DATA:  Pain and swelling. EXAM: LEFT KNEE - 1-2 VIEW COMPARISON:  08/22/2013. FINDINGS: Prominent knee joint effusion noted. No evidence of fracture or dislocation. IMPRESSION: Prominent knee joint effusion.  No acute bony abnormality. Electronically Signed   By: Marcello Moores  Register   On: 08/29/2016 16:32   US Renal  Result Date: 08/17/2016 CLINICAL DATA:  Acute onset of renal insufficiency. Initial encounter. EXAM: RENAL / URINARY TRACT ULTRASOUND COMPLETE COMPARISON:  CT of the abdomen and pelvis from 09/02/2009 FINDINGS: Right Kidney: Length: 12.6 cm. Increased parenchymal echogenicity noted. No mass or hydronephrosis visualized. Left Kidney: Length: 11.6 cm. Increased parenchymal echogenicity noted. No mass or hydronephrosis visualized. Difficult to fully assess due to overlying structures. Bladder: Appears normal for degree of bladder distention. IMPRESSION: 1. No evidence of hydronephrosis. 2. Increased renal parenchymal echogenicity may reflect medical renal disease. Electronically Signed   By: Garald Balding M.D.   On: 08/17/2016 06:39     LOS: 64 days   Oren Binet, MD  [facility] Pager:[phone]  If 7PM-7AM, please contact night-coverage [email or web address] Password [facility] 08/31/2016, 10:23 AM

## 2016-08-31 NOTE — Consult Note (Signed)
Date of Admission:  08/16/2016  Date of Consult:  08/31/2016  Reason for Consult: left knee septic arthritis Referring Physician: Dr. Sloan Leiter   HPI: Joshua Massey is an 41 y.o. male with non-ischemic cardiomyopathy, insulin-dependent poorly controlled Type 2 diabetes, CKD Stage III, GERD, obesity, hypertension hospitalized for acute on chronic systolic heart failure.   About 2-3 days ago, he developed pain in his left knee. Orthopedics was consulted on 3/12, and on aspiration, his synovial fluid showed WBC 39,525 [PMN 93%] without crystals. Steroids were injected for a diagnosis of gout, and he went for arthroscopy on 3/13. Cultures [mislabeled as right knee in the system] resulted with Group A strep, so he was started on vancomycin. ID was consulted today for further management.  Aside from diabetes, he denies any ongoing alcohol, tobacco, illicit drug use, recent surgery to the knee. HIV resulted negative earlier on admission, and he has a longstanding history of poorly controlled, insulin-dependent Type 2 diabetes as evident by high A1c, most recently 11.2 on admission.    Past Medical History:  Diagnosis Date  . 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 Diabetes mellitus    adult-onset, type 2  . Exogenous obesity   . GERD (gastroesophageal reflux disease)   . Hypertension     Past Surgical History:  Procedure Laterality Date  . ANKLE SURGERY  2003   plate and screws  . CARDIAC CATHETERIZATION  02/16/2012   50% LAD lesion in mid vessel, otherwise no significant obstructive CAD  . HIP PINNING  1990   bilateral  . KNEE ARTHROSCOPY Left 08/30/2016   Procedure: ARTHROSCOPY KNEE;  Surgeon: Renette Butters, MD;  Location: [facility];  Service: Orthopedics;  Laterality: Left;  . LEFT AND RIGHT HEART CATHETERIZATION WITH CORONARY ANGIOGRAM N/A 02/16/2012   Procedure: LEFT AND RIGHT HEART CATHETERIZATION WITH CORONARY ANGIOGRAM;  Surgeon: Pixie Casino,  MD;  Location: [facility] CATH LAB;  Service: Cardiovascular;  Laterality: N/A;  . TRANSTHORACIC ECHOCARDIOGRAM  08/31/2012   EF 35-40%, no significant wall motion abnormalities, diastolic relaxation abnormality - no longer needed LifeVest; LV cavity size mod dilated with mod conc hypertrophy, systolic function mod reduced; mild MR, LA mod dilated    Social History:  reports that he has never smoked. He has never used smokeless tobacco. He reports that he does not drink alcohol or use drugs.   Family History  Problem Relation Age of Onset  . Coronary artery disease Brother     CABG at 70, DM  . Diabetes Mother   . Hypertension Mother   . Diabetes Father     No Known Allergies   Medications: I have reviewed patients current medications as documented in Epic Anti-infectives    Start     Dose/Rate Route Frequency Ordered Stop   08/31/16 1500  vancomycin (VANCOCIN) 1,500 mg in sodium chloride 0.9 % 500 mL IVPB     1,500 mg 250 mL/hr over 120 Minutes Intravenous Every 24 hours 08/31/16 1204     08/30/16 2300  vancomycin (VANCOCIN) 1,250 mg in sodium chloride 0.9 % 250 mL IVPB  Status:  Discontinued     1,250 mg 166.7 mL/hr over 90 Minutes Intravenous Every 12 hours 08/30/16 1129 08/30/16 1730   08/30/16 1545  vancomycin (VANCOCIN) 1,500 mg in sodium chloride 0.9 % 250 mL IVPB  Status:  Discontinued     1,500 mg 125 mL/hr over 120 Minutes Intravenous To Surgery 08/30/16 1530 08/30/16 1538  08/30/16 1545  vancomycin (VANCOCIN) 1,500 mg in sodium chloride 0.9 % 500 mL IVPB     1,500 mg 250 mL/hr over 120 Minutes Intravenous To Surgery 08/30/16 1536 08/30/16 1745   08/30/16 1130  vancomycin (VANCOCIN) 2,500 mg in sodium chloride 0.9 % 500 mL IVPB  Status:  Discontinued     2,500 mg 250 mL/hr over 120 Minutes Intravenous  Once 08/30/16 1129 08/31/16 1204   08/17/16 1000  doxycycline (VIBRAMYCIN) 100 mg in dextrose 5 % 250 mL IVPB  Status:  Discontinued     100 mg 125 mL/hr over 120 Minutes  Intravenous Every 12 hours 08/17/16 0235 08/17/16 1307   08/17/16 0015  doxycycline (VIBRA-TABS) tablet 100 mg     100 mg Oral  Once 08/17/16 0002 08/17/16 0107      ROS: As in HPI otherwise remainder of 12 point review of systems is negative   Blood pressure (!) 139/100, pulse 85, temperature 97.8 F (36.6 C), temperature source Axillary, resp. rate 18, height 5' 11" (1.803 m), weight (!) 308 lb 3.3 oz (139.8 kg), SpO2 96 %. Physical Exam  Constitutional: He is oriented to person, place, and time. No distress.  HENT:  Head: Normocephalic and atraumatic.  Eyes: Conjunctivae are normal. No scleral icterus.  Cardiovascular: Normal rate and regular rhythm.   Pulmonary/Chest: Effort normal. No respiratory distress.  Abdominal: Soft.  Musculoskeletal: He exhibits edema (1+ pitting edema on the RLE).  LLE wrapped with bandage.   Neurological: He is alert and oriented to person, place, and time.  Skin: Skin is warm and dry. He is not diaphoretic.    Results for orders placed or performed during the hospital encounter of 08/16/16 (from the past 48 hour(s))  Glucose, capillary     Status: Abnormal   Collection Time: 08/29/16  4:42 PM  Result Value Ref Range   Glucose-Capillary 187 (H) 65 - 99 mg/dL   Comment 1 Notify RN   Synovial cell count + diff, w/ crystals     Status: Abnormal   Collection Time: 08/29/16  6:07 PM  Result Value Ref Range   Color, Synovial YELLOW YELLOW   Appearance-Synovial CLOUDY (A) CLEAR   Crystals, Fluid NO CRYSTALS SEEN    WBC, Synovial 39,525 (H) 0 - 200 /cu mm   Neutrophil, Synovial 93 (H) 0 - 25 %   Lymphocytes-Synovial Fld 4 0 - 20 %   Monocyte-Macrophage-Synovial Fluid 3 (L) 50 - 90 %  Glucose, capillary     Status: Abnormal   Collection Time: 08/29/16 10:16 PM  Result Value Ref Range   Glucose-Capillary 219 (H) 65 - 99 mg/dL   Comment 1 Notify RN    Comment 2 Document in Chart   Basic metabolic panel     Status: Abnormal   Collection Time:  08/30/16  2:36 AM  Result Value Ref Range   Sodium 134 (L) 135 - 145 mmol/L   Potassium 4.5 3.5 - 5.1 mmol/L   Chloride 92 (L) 101 - 111 mmol/L   CO2 30 22 - 32 mmol/L   Glucose, Bld 226 (H) 65 - 99 mg/dL   BUN 53 (H) 6 - 20 mg/dL   Creatinine, Ser 2.38 (H) 0.61 - 1.24 mg/dL   Calcium 8.2 (L) 8.9 - 10.3 mg/dL   GFR calc non Af Amer 33 (L) >60 mL/min   GFR calc Af Amer 38 (L) >60 mL/min    Comment: (NOTE) The eGFR has been calculated using the CKD EPI equation. This calculation  has not been validated in all clinical situations. eGFR's persistently <60 mL/min signify possible Chronic Kidney Disease.    Anion gap 12 5 - 15  Glucose, capillary     Status: Abnormal   Collection Time: 08/30/16  6:37 AM  Result Value Ref Range   Glucose-Capillary 188 (H) 65 - 99 mg/dL  Glucose, capillary     Status: Abnormal   Collection Time: 08/30/16 11:07 AM  Result Value Ref Range   Glucose-Capillary 226 (H) 65 - 99 mg/dL   Comment 1 Notify RN   Glucose, capillary     Status: Abnormal   Collection Time: 08/30/16  1:29 PM  Result Value Ref Range   Glucose-Capillary 236 (H) 65 - 99 mg/dL  Gram stain     Status: None   Collection Time: 08/30/16  3:18 PM  Result Value Ref Range   Specimen Description SYNOVIAL LEFT KNEE    Special Requests NONE    Gram Stain      ABUNDANT WBC PRESENT, PREDOMINANTLY PMN RARE GRAM POSITIVE COCCI IN PAIRS    Report Status 08/30/2016 FINAL   Culture, body fluid-bottle     Status: None (Preliminary result)   Collection Time: 08/30/16  3:18 PM  Result Value Ref Range   Specimen Description SYNOVIAL LEFT KNEE    Special Requests BOTTLES DRAWN AEROBIC AND ANAEROBIC 10CC    Gram Stain      GRAM POSITIVE COCCI IN PAIRS IN CHAINS IN BOTH AEROBIC AND ANAEROBIC BOTTLES    Culture NO GROWTH < 24 HOURS    Report Status PENDING   Glucose, capillary     Status: Abnormal   Collection Time: 08/30/16  3:55 PM  Result Value Ref Range   Glucose-Capillary 238 (H) 65 - 99  mg/dL   Comment 1 Notify RN    Comment 2 Document in Chart   Glucose, capillary     Status: Abnormal   Collection Time: 08/30/16  4:49 PM  Result Value Ref Range   Glucose-Capillary 211 (H) 65 - 99 mg/dL   Comment 1 Notify RN    Comment 2 Document in Chart   Glucose, capillary     Status: Abnormal   Collection Time: 08/30/16  8:58 PM  Result Value Ref Range   Glucose-Capillary 262 (H) 65 - 99 mg/dL   Comment 1 Notify RN    Comment 2 Document in Chart   Basic metabolic panel     Status: Abnormal   Collection Time: 08/31/16  1:48 AM  Result Value Ref Range   Sodium 130 (L) 135 - 145 mmol/L   Potassium 5.0 3.5 - 5.1 mmol/L   Chloride 90 (L) 101 - 111 mmol/L   CO2 28 22 - 32 mmol/L   Glucose, Bld 241 (H) 65 - 99 mg/dL   BUN 59 (H) 6 - 20 mg/dL   Creatinine, Ser 2.49 (H) 0.61 - 1.24 mg/dL   Calcium 8.0 (L) 8.9 - 10.3 mg/dL   GFR calc non Af Amer 31 (L) >60 mL/min   GFR calc Af Amer 36 (L) >60 mL/min    Comment: (NOTE) The eGFR has been calculated using the CKD EPI equation. This calculation has not been validated in all clinical situations. eGFR's persistently <60 mL/min signify possible Chronic Kidney Disease.    Anion gap 12 5 - 15  Glucose, capillary     Status: Abnormal   Collection Time: 08/31/16  5:58 AM  Result Value Ref Range   Glucose-Capillary 224 (H) 65 - 99 mg/dL  Glucose, capillary     Status: Abnormal   Collection Time: 08/31/16 11:29 AM  Result Value Ref Range   Glucose-Capillary 202 (H) 65 - 99 mg/dL   _0 (sdes,specrequest,cult,reptstatus)   ) Recent Results (from the past 720 hour(s))  Gram stain     Status: None   Collection Time: 08/29/16  8:58 AM  Result Value Ref Range Status   Specimen Description SYNOVIAL RIGHT KNEE  Final   Special Requests NONE  Final   Gram Stain   Final    ABUNDANT WBC PRESENT, PREDOMINANTLY PMN NO ORGANISMS SEEN    Report Status 08/29/2016 FINAL  Final  Culture, body fluid-bottle     Status: Abnormal  (Preliminary result)   Collection Time: 08/29/16  8:58 AM  Result Value Ref Range Status   Specimen Description SYNOVIAL RIGHT KNEE  Final   Special Requests BOTTLES DRAWN AEROBIC AND ANAEROBIC 5CC  Final   Gram Stain   Final    GRAM POSITIVE COCCI IN CHAINS IN BOTH AEROBIC AND ANAEROBIC BOTTLES CRITICAL RESULT CALLED TO, READ BACK BY AND VERIFIED WITH: TAlyse Low RN, AT [phone] 08/30/16 BY D. VANHOOK    Culture (A)  Final    GROUP A STREP (S.PYOGENES) ISOLATED SUSCEPTIBILITIES TO FOLLOW    Report Status PENDING  Incomplete  Gram stain     Status: None   Collection Time: 08/30/16  3:18 PM  Result Value Ref Range Status   Specimen Description SYNOVIAL LEFT KNEE  Final   Special Requests NONE  Final   Gram Stain   Final    ABUNDANT WBC PRESENT, PREDOMINANTLY PMN RARE GRAM POSITIVE COCCI IN PAIRS    Report Status 08/30/2016 FINAL  Final  Culture, body fluid-bottle     Status: None (Preliminary result)   Collection Time: 08/30/16  3:18 PM  Result Value Ref Range Status   Specimen Description SYNOVIAL LEFT KNEE  Final   Special Requests BOTTLES DRAWN AEROBIC AND ANAEROBIC 10CC  Final   Gram Stain   Final    GRAM POSITIVE COCCI IN PAIRS IN CHAINS IN BOTH AEROBIC AND ANAEROBIC BOTTLES    Culture NO GROWTH < 24 HOURS  Final   Report Status PENDING  Incomplete     Impression/Recommendation  Principal Problem:   Acute on chronic systolic CHF (congestive heart failure) (HCC) Active Problems:   Essential hypertension   CHF (congestive heart failure) (HCC)   Type 2 diabetes mellitus with vascular disease (HCC)   Acute renal failure (HCC)   Acute systolic CHF (congestive heart failure) (HCC)   Peripheral edema   AKI (acute kidney injury) ([place])   CKD (chronic kidney disease) stage 4, GFR 15-29 ml/min (HCC)   Arthritis, septic (HCC)  Joshua Massey is a 41 y.o. male with non-ischemic cardiomyopathy, insulin-dependent poorly controlled Type 2 diabetes, CKD Stage III, GERD, obesity,  hypertension hospitalized for acute on chronic systolic heart failure found to have left knee septic arthritis.   Left knee septic arthritis: Cultures notable for Group A Strep which is penicillin sensitive.  -Narrow vancomycin to penicillin V 2 million units every 4 hours [Day 2]. Because this medication is dosed in dextrose, he will require better glycemic control. He will require 14-day course of this medication followed by 14-day course on oral antibiotics for total 4-week course.  Poorly controlled Type 2 diabetes: CBGs trending in the 200s today. He received a dose of prednisone yesterday, and his antibiotic is dosed in dextrose, so he will require higher intensity insulin therapy.  Thank you so much for this interesting consult  [place] for [place] [phone] (pager) [phone] (office) 08/31/2016, 2:10 PM  Posey Pronto, [place] 08/31/2016, 2:10 PM

## 2016-08-31 NOTE — Consult Note (Signed)
[facility] Nurse wound follow up Wound type:BLE weeping Sites have resolved, skin continues to be fragile over the pretibial and lateral calves Wound bed: no open wounds at the time of my assessment Drainage (amount, consistency, odor) none Periwound: edematous, palpable pulses  Dressing procedure/placement/frequency: Continue xeroform over the fragile skin, wrap with ACE wraps from first metatarsal head to the patellar notch each day.  [facility] Nurse team will follow along with you for weekly wound assessments.  Please notify me of any acute changes in the wounds or any new areas of concerns [place] MSN, [place], CNS [phone]

## 2016-08-31 NOTE — Progress Notes (Signed)
   Assessment: 1 Day Post-Op  S/P Procedure(s) (LRB): ARTHROSCOPY KNEE (Left) by Dr. Ernesta Amble. Joshua Massey on 08/30/16  Principal Problem:   Acute on chronic systolic CHF (congestive heart failure) ([place]) Active Problems:   Essential hypertension   CHF (congestive heart failure) (HCC)   Type 2 diabetes mellitus with vascular disease (HCC)   Acute renal failure (HCC)   Acute systolic CHF (congestive heart failure) (HCC)   Peripheral edema   AKI (acute kidney injury) ([place])   CKD (chronic kidney disease) stage 4, GFR 15-29 ml/min (HCC)   Arthritis, septic (HCC)  Left Knee septic arthritis:  Aspiration positive for gram-positive cocci  Synovial sample shows gram-positive cocci in pairs and chains.  s/p arthroscopy / washout.  Plan: Continue antibiotic therapy  Mobilize with therapy Maintain dressings, ice, and ace wrap.  Dressing may removed and dry dressing placed in 3-5 days.  No soaking or submerging incisions. He may bear weight as tolerated  Follow up in the office with Dr. Alain Marion in 1-2 weeks.  Please call with questions.   Subjective: Patient reports pain as moderate.  Tolerating diet.  Urinating.  +Flatus.  No CP, SOB.  Not yet OOB.  Objective:   VITALS:   Vitals:   08/30/16 1758 08/30/16 1830 08/30/16 2056 08/31/16 0614  BP: 137/85 116/69 108/68 (!) 139/100  Pulse:   (!) 103 85  Resp:   18 18  Temp:   97.6 F (36.4 C) 97.8 F (36.6 C)  TempSrc:   Oral Axillary  SpO2:   94% 96%  Weight:      Height:       CBC Latest Ref Rng & Units 08/27/2016 08/26/2016 08/18/2016  WBC 4.0 - 10.5 K/uL 11.0(H) 11.5(H) 12.2(H)  Hemoglobin 13.0 - 17.0 g/dL 10.8(L) 10.7(L) 10.9(L)  Hematocrit 39.0 - 52.0 % 33.0(L) 33.5(L) 33.3(L)  Platelets 150 - 400 K/uL 467(H) 469(H) 341   BMP Latest Ref Rng & Units 08/31/2016 08/30/2016 08/29/2016  Glucose 65 - 99 mg/dL 241(H) 226(H) 173(H)  BUN 6 - 20 mg/dL 59(H) 53(H) 45(H)  Creatinine 0.61 - 1.24 mg/dL 2.49(H) 2.38(H) 2.27(H)  Sodium 135 -  145 mmol/L 130(L) 134(L) 133(L)  Potassium 3.5 - 5.1 mmol/L 5.0 4.5 4.0  Chloride 101 - 111 mmol/L 90(L) 92(L) 92(L)  CO2 22 - 32 mmol/L 28 30 30   Calcium 8.9 - 10.3 mg/dL 8.0(L) 8.2(L) 8.4(L)   Intake/Output      03/13 0701 - 03/14 0700 03/14 0701 - 03/15 0700   P.O. 240    I.V. (mL/kg) 850 (6.1)    IV Piggyback 566    Total Intake(mL/kg) 1656 (11.8)    Urine (mL/kg/hr) 1225 (0.4)    Blood 10 (0)    Total Output 1235     Net +421            Physical Exam: General: NAD.  Supine in bed MSK LLE: Neurovascularly intact Sensation intact distally Foot warm Dorsiflexion/Plantar flexion intact Incision: dressing C/D/I  Prudencio Burly III, PA-C 08/31/2016, 7:57 AM

## 2016-08-31 NOTE — Progress Notes (Signed)
Physical Therapy Treatment Patient Details Name: Joshua Massey MRN: [id number] DOB: 1975-07-11 Today's Date: 08/31/2016    History of Present Illness 41 yo male with nonischemic cardiomyopthy, EF 35 to 40%, presents with worsening dyspnea and lower extremity edema, CHF exacerbation and pericardial effusion. PMHx: DM, HTN, obesity. Pt s/p I and D of L knee 3/14.    PT Comments    Pt c/o L knee pain. With max encouragement pt amb 150' with RW. Pt progressively able to increase WBing on L LE. Pt educated on importance of OOB mobility. Acute PT to con't to follow to progress indep with mobility and complete stair negotiation.    Follow Up Recommendations  No PT follow up     Equipment Recommendations  None recommended by PT    Recommendations for Other Services       Precautions / Restrictions Precautions Precautions: Fall Precaution Comments: educated on importance of movement to prevent stiffness from the excess fluid Restrictions Weight Bearing Restrictions: No, WBAT  L LE    Mobility  Bed Mobility Overal bed mobility: Needs Assistance Bed Mobility: Supine to Sit;Sit to Supine     Supine to sit: Min assist Sit to supine: Min assist   General bed mobility comments: HOB elevated, minA for L LE management in/out of the bed. due to pain  Transfers Overall transfer level: Needs assistance Equipment used: Rolling walker (2 wheeled) Transfers: Sit to/from Stand Sit to Stand: Min guard         General transfer comment: v/c's to push up from bed and BSC not walker, rocking momentum  Ambulation/Gait Ambulation/Gait assistance: Min guard Ambulation Distance (Feet): 150 Feet Assistive device: Rolling walker (2 wheeled) Gait Pattern/deviations: Antalgic;Wide base of support Gait velocity: decreased, however progressively increased as pt ambulated Gait velocity interpretation: Below normal speed for age/gender General Gait Details: pt initially putting <25% WBIng  through L LE then s/p 50 feet pt able to put closer to 75% WBing status on  L LE.   Stairs            Wheelchair Mobility    Modified Rankin (Stroke Patients Only)       Balance Overall balance assessment: Needs assistance Sitting-balance support: No upper extremity supported Sitting balance-Leahy Scale: Good     Standing balance support: During functional activity Standing balance-Leahy Scale: Fair Standing balance comment: needs RW for safe ambulation                    Cognition Arousal/Alertness: Awake/alert Behavior During Therapy: WFL for tasks assessed/performed Overall Cognitive Status: Within Functional Limits for tasks assessed                 General Comments: pt self limiting    Exercises General Exercises - Lower Extremity Quad Sets: AROM;Left;20 reps;Supine    General Comments General comments (skin integrity, edema, etc.): max encouragement to participate, educated on importance of OOB mobility      Pertinent Vitals/Pain Pain Assessment: 0-10 Pain Score: 8  Pain Location: L LE  Pain Descriptors / Indicators: Sharp Pain Intervention(s): RN gave pain meds during session    Home Living                      Prior Function            PT Goals (current goals can now be found in the care plan section) Acute Rehab PT Goals Patient Stated Goal: home Progress towards PT goals: Progressing  toward goals    Frequency    Min 3X/week      PT Plan Current plan remains appropriate    Co-evaluation             End of Session Equipment Utilized During Treatment: Gait belt Activity Tolerance: Patient limited by fatigue;Patient limited by pain Patient left: in bed;with call bell/phone within reach Nurse Communication: Mobility status PT Visit Diagnosis: Unsteadiness on feet (R26.81);Muscle weakness (generalized) (M62.81);Difficulty in walking, not elsewhere classified (R26.2);Pain Pain - Right/Left: Left Pain - part  of body: Leg     Time: 6378-5885 PT Time Calculation (min) (ACUTE ONLY): 32 min  Charges:  $Gait Training: 8-22 mins $Therapeutic Activity: 8-22 mins                    G Codes:       Norva Bowe M Darolyn Double 2016-09-18, 11:51 AM   Kittie Plater, PT, DPT Pager #: [phone] Office #: [phone]

## 2016-08-31 NOTE — Consult Note (Signed)
[facility] Nurse wound follow up Wound type: LLE weeping Measurement: sites have resolved, skin fragile and peeling especially on posterior calf area. Wound bed:no open wounds at time of my asessment Drainage (amount, consistency, odor) none Periwound:edematous leg Dressing procedure/placement/frequency: Continue  Current treatment.  I have wrapped leg today starting from the first metatarsal head to the patellar notch.  Pt has had a surgical procedure and has two puncture incisions just below knee.  Covered with sterile telfa non adherent dressing.  [facility] team will no longer follow this pt but will remain available if needed.  Please reconsult if the need arises.   Fara Olden, RN-C, WTA-C Wound Treatment Associate

## 2016-09-01 ENCOUNTER — Inpatient Hospital Stay (HOSPITAL_COMMUNITY): Payer: BLUE CROSS/BLUE SHIELD

## 2016-09-01 ENCOUNTER — Encounter (HOSPITAL_COMMUNITY): Payer: Self-pay | Admitting: Interventional Radiology

## 2016-09-01 HISTORY — PX: IR GENERIC HISTORICAL: IMG1180011

## 2016-09-01 LAB — CULTURE, BODY FLUID W GRAM STAIN -BOTTLE

## 2016-09-01 LAB — CBC
HEMATOCRIT: 31.8 % — AB (ref 39.0–52.0)
HEMOGLOBIN: 10.2 g/dL — AB (ref 13.0–17.0)
MCH: 28.3 pg (ref 26.0–34.0)
MCHC: 32.1 g/dL (ref 30.0–36.0)
MCV: 88.1 fL (ref 78.0–100.0)
Platelets: 488 10*3/uL — ABNORMAL HIGH (ref 150–400)
RBC: 3.61 MIL/uL — ABNORMAL LOW (ref 4.22–5.81)
RDW: 16 % — ABNORMAL HIGH (ref 11.5–15.5)
WBC: 10.1 10*3/uL (ref 4.0–10.5)

## 2016-09-01 LAB — BASIC METABOLIC PANEL
ANION GAP: 6 (ref 5–15)
BUN: 61 mg/dL — AB (ref 6–20)
CHLORIDE: 93 mmol/L — AB (ref 101–111)
CO2: 33 mmol/L — AB (ref 22–32)
Calcium: 8 mg/dL — ABNORMAL LOW (ref 8.9–10.3)
Creatinine, Ser: 2.6 mg/dL — ABNORMAL HIGH (ref 0.61–1.24)
GFR calc Af Amer: 34 mL/min — ABNORMAL LOW (ref >60)
GFR calc non Af Amer: 29 mL/min — ABNORMAL LOW (ref >60)
GLUCOSE: 168 mg/dL — AB (ref 65–99)
POTASSIUM: 4.8 mmol/L (ref 3.5–5.1)
Sodium: 132 mmol/L — ABNORMAL LOW (ref 135–145)

## 2016-09-01 LAB — CULTURE, BODY FLUID-BOTTLE

## 2016-09-01 LAB — GLUCOSE, CAPILLARY
GLUCOSE-CAPILLARY: 296 mg/dL — AB (ref 65–99)
Glucose-Capillary: 154 mg/dL — ABNORMAL HIGH (ref 65–99)
Glucose-Capillary: 187 mg/dL — ABNORMAL HIGH (ref 65–99)
Glucose-Capillary: 187 mg/dL — ABNORMAL HIGH (ref 65–99)

## 2016-09-01 MED ORDER — DEXTROSE 5 % IV SOLN
2.0000 g | Freq: Two times a day (BID) | INTRAVENOUS | Status: DC
  Filled 2016-09-01: qty 2

## 2016-09-01 MED ORDER — LIDOCAINE HCL (PF) 1 % IJ SOLN
INTRAMUSCULAR | Status: DC | PRN
  Administered 2016-09-01: 10 mL

## 2016-09-01 MED ORDER — DEXTROSE 5 % IV SOLN
2.0000 g | INTRAVENOUS | Status: DC
  Administered 2016-09-01 – 2016-09-02 (×2): 2 g via INTRAVENOUS
  Filled 2016-09-01 (×2): qty 2

## 2016-09-01 NOTE — Progress Notes (Signed)
Called Orthopedic PA [place] concerning left leg wrapping and dressing.  Patient concerned that he was told by surgeon yesterday that dressing needed to stay on 3-5 days. I explained that [place] saw patient yesterday afternoon and changed surgical dressing.  PA states continue to follow [place] note and let patient know he must keep area clean and dry (no tubs or soaks). I explained to patient and adjusted ace wrap for comfort. Pt resting with call bell within reach.  Will continue to monitor. Joshua Emerald, RN

## 2016-09-01 NOTE — Care Management Note (Addendum)
Case Management Note Previous CM note initiated by Carles Collet, RN 08/18/2016, 3:00 PM  Patient Details  Name: Joshua Massey MRN: [id number] Date of Birth: [birth year]  Subjective/Objective:                 Patient independent from home, diuresing with lasix. Patient states he srives to appointments, denies difficulties paying for meds. Spoke with PT, state patient would not need HH at this time. CM will continue to follow for DC planning.  Cardiology: Terri Piedra: [place]: Suzie Portela on [address]   Addendum 3/9 14:40 Follow up scheduled with Betty [name] MD [facility] for 3/16 at 8:00 pt to arrive 15 minutes early, entered in AVS. Referral made to [facility] for [facility] RN disease management in 41 yr old with CHF and extensive edema on admission.    Action/Plan:   Expected Discharge Date:                  Expected Discharge Plan:  [place]  In-House Referral:     Discharge planning Services  CM Consult  Post Acute Care Choice:  Durable Medical Equipment, Home Health Choice offered to:  Patient  DME Arranged:  Walker rolling (bariatric, has been delivered to room) DME Agency:  [place] Arranged:  RN, PT, OT, Nurse's Aide, IV Antibiotics (needs orders for [facility] RN) [place] Agency:  [place]  Status of Service:  In process, will continue to follow  If discussed at Long Length of Stay Meetings, dates discussed:  3/13  Discharge Disposition: home with home health   Additional Comments:  09/01/16- 1500- Marvetta Gibbons RN, CM- pt has developed septic knee during hospital coarse s/p washout- will now need 2 weeks of IV abx- spoke with pt at bedside to confirm d/c plans- per pt he has a girlfriend - [name]- that can assist at home, pt does not remember selecting [facility] for [facility] agency and asks if he can use [facility]- discussed choice and pt states he would prefer to use [facility] for all services since [facility] will be doing the IV abx.  Notified [place] with [facility] to cancel referral for [facility] and referral made to Santiago Glad with [place] with [facility] following for IV abx needs. [facility] has already delivered RW to room for home use- no other DME needs noted. Orders have been placed for HHRN/PT/OT/aide- pt concerned about wrap on LLE being changed- have asked bedside RN- [name] to f/u with ortho. MD regarding this.   Dahlia Client [place], RN 09/01/2016, 3:22 PM [phone]

## 2016-09-01 NOTE — Progress Notes (Signed)
Subjective: This morning, he was resting when I saw him. He denies any complaints, like fever/chills, poor appetite, nausea, vomiting. We reviewed the importance of taking his antibiotic for the specified time. He did acknowledge left knee pain worse at night which I explained should improve so long as he continues his antibiotic therapy.   Antibiotics:  Anti-infectives    Start     Dose/Rate Route Frequency Ordered Stop   08/31/16 1600  penicillin G potassium 2 Million Units in dextrose 5 % 50 mL IVPB    Comments:  May adjust for renal dosing.   2 Million Units 100 mL/hr over 30 Minutes Intravenous Every 4 hours 08/31/16 1435     08/31/16 1500  vancomycin (VANCOCIN) 1,500 mg in sodium chloride 0.9 % 500 mL IVPB  Status:  Discontinued     1,500 mg 250 mL/hr over 120 Minutes Intravenous Every 24 hours 08/31/16 1204 08/31/16 1434   08/30/16 2300  vancomycin (VANCOCIN) 1,250 mg in sodium chloride 0.9 % 250 mL IVPB  Status:  Discontinued     1,250 mg 166.7 mL/hr over 90 Minutes Intravenous Every 12 hours 08/30/16 1129 08/30/16 1730   08/30/16 1545  vancomycin (VANCOCIN) 1,500 mg in sodium chloride 0.9 % 250 mL IVPB  Status:  Discontinued     1,500 mg 125 mL/hr over 120 Minutes Intravenous To Surgery 08/30/16 1530 08/30/16 1538   08/30/16 1545  vancomycin (VANCOCIN) 1,500 mg in sodium chloride 0.9 % 500 mL IVPB     1,500 mg 250 mL/hr over 120 Minutes Intravenous To Surgery 08/30/16 1536 08/30/16 1745   08/30/16 1130  vancomycin (VANCOCIN) 2,500 mg in sodium chloride 0.9 % 500 mL IVPB  Status:  Discontinued     2,500 mg 250 mL/hr over 120 Minutes Intravenous  Once 08/30/16 1129 08/31/16 1204   08/17/16 1000  doxycycline (VIBRAMYCIN) 100 mg in dextrose 5 % 250 mL IVPB  Status:  Discontinued     100 mg 125 mL/hr over 120 Minutes Intravenous Every 12 hours 08/17/16 0235 08/17/16 1307   08/17/16 0015  doxycycline (VIBRA-TABS) tablet 100 mg     100 mg Oral  Once 08/17/16 0002 08/17/16  0107      Medications: Scheduled Meds: . aspirin EC  81 mg Oral Daily  . carvedilol  25 mg Oral BID WC  . ferrous sulfate  325 mg Oral BID WC  . heparin  5,000 Units Subcutaneous Q8H  . hydrALAZINE  50 mg Oral Q8H  . insulin aspart  0-9 Units Subcutaneous TID WC  . insulin glargine  5 Units Subcutaneous QHS  . isosorbide mononitrate  60 mg Oral Daily  . living well with diabetes book   Does not apply Once  . pantoprazole  40 mg Oral Daily  . pencillin G potassium IV  2 Million Units Intravenous Q4H   Continuous Infusions: PRN Meds:.fentaNYL (SUBLIMAZE) injection, hydrALAZINE, HYDROcodone-acetaminophen, midazolam, morphine injection, ondansetron **OR** ondansetron (ZOFRAN) IV    Objective: Weight change:   Intake/Output Summary (Last 24 hours) at 09/01/16 4076 Last data filed at 09/01/16 0900  Gross per 24 hour  Intake                0 ml  Output             3375 ml  Net            -3375 ml   Blood pressure (!) 138/96, pulse 81, temperature 98.7 F (37.1 C), temperature  source Oral, resp. rate 18, height 5' 11"  (1.803 m), weight (!) 307 lb (139.3 kg), SpO2 96 %. Temp:  [98.4 F (36.9 C)-98.7 F (37.1 C)] 98.7 F (37.1 C) (03/15 0444) Pulse Rate:  [81-109] 81 (03/15 0444) Resp:  [18] 18 (03/15 0444) BP: (118-138)/(80-96) 138/96 (03/15 0444) SpO2:  [95 %-98 %] 96 % (03/15 0444) Weight:  [307 lb (139.3 kg)] 307 lb (139.3 kg) (03/15 0444)  Physical Exam: Physical Exam  Constitutional: He is oriented to person, place, and time. No distress.  HENT:  Head: Normocephalic and atraumatic.  Eyes: Conjunctivae are normal. No scleral icterus.  Cardiovascular: Normal rate and regular rhythm.   Pulmonary/Chest: Effort normal. No respiratory distress.  Musculoskeletal:  LLE covered in dressing.  Neurological: He is alert and oriented to person, place, and time.  Skin: He is not diaphoretic.     CBC: CBC Latest Ref Rng & Units 09/01/2016 08/27/2016 08/26/2016  WBC 4.0 - 10.5  K/uL 10.1 11.0(H) 11.5(H)  Hemoglobin 13.0 - 17.0 g/dL 10.2(L) 10.8(L) 10.7(L)  Hematocrit 39.0 - 52.0 % 31.8(L) 33.0(L) 33.5(L)  Platelets 150 - 400 K/uL 488(H) 467(H) 469(H)      BMET  Recent Labs  08/31/16 0148 09/01/16 0229  NA 130* 132*  K 5.0 4.8  CL 90* 93*  CO2 28 33*  GLUCOSE 241* 168*  BUN 59* 61*  CREATININE 2.49* 2.60*  CALCIUM 8.0* 8.0*   Micro Results: Recent Results (from the past 720 hour(s))  Gram stain     Status: None   Collection Time: 08/29/16  8:58 AM  Result Value Ref Range Status   Specimen Description SYNOVIAL RIGHT KNEE  Final   Special Requests NONE  Final   Gram Stain   Final    ABUNDANT WBC PRESENT, PREDOMINANTLY PMN NO ORGANISMS SEEN    Report Status 08/29/2016 FINAL  Final  Culture, body fluid-bottle     Status: Abnormal (Preliminary result)   Collection Time: 08/29/16  8:58 AM  Result Value Ref Range Status   Specimen Description SYNOVIAL RIGHT KNEE  Final   Special Requests BOTTLES DRAWN AEROBIC AND ANAEROBIC 5CC  Final   Gram Stain   Final    GRAM POSITIVE COCCI IN CHAINS IN BOTH AEROBIC AND ANAEROBIC BOTTLES CRITICAL RESULT CALLED TO, READ BACK BY AND VERIFIED WITH: Dionicio Stall RN, AT [phone] 08/30/16 BY D. VANHOOK    Culture (A)  Final    GROUP A STREP (S.PYOGENES) ISOLATED SUSCEPTIBILITIES TO FOLLOW    Report Status PENDING  Incomplete  Gram stain     Status: None   Collection Time: 08/30/16  3:18 PM  Result Value Ref Range Status   Specimen Description SYNOVIAL LEFT KNEE  Final   Special Requests NONE  Final   Gram Stain   Final    ABUNDANT WBC PRESENT, PREDOMINANTLY PMN RARE GRAM POSITIVE COCCI IN PAIRS    Report Status 08/30/2016 FINAL  Final  Culture, body fluid-bottle     Status: None (Preliminary result)   Collection Time: 08/30/16  3:18 PM  Result Value Ref Range Status   Specimen Description SYNOVIAL LEFT KNEE  Final   Special Requests BOTTLES DRAWN AEROBIC AND ANAEROBIC 10CC  Final   Gram Stain   Final    GRAM  POSITIVE COCCI IN PAIRS IN CHAINS IN BOTH AEROBIC AND ANAEROBIC BOTTLES    Culture NO GROWTH < 24 HOURS  Final   Report Status PENDING  Incomplete    Studies/Results: No results found.  Assessment/Plan:  Principal Problem:   Acute on chronic systolic CHF (congestive heart failure) (HCC) Active Problems:   Essential hypertension   CHF (congestive heart failure) (HCC)   Type 2 diabetes mellitus with vascular disease (HCC)   Acute renal failure (HCC)   Acute systolic CHF (congestive heart failure) (HCC)   Peripheral edema   AKI (acute kidney injury) ([place])   CKD (chronic kidney disease) stage 4, GFR 15-29 ml/min (HCC)   Arthritis, septic (HCC)  Joshua Massey is a 41 y.o. male with non-ischemic cardiomyopathy, insulin-dependent poorly controlled Type 2 diabetes, CKD Stage III, GERD, obesity, hypertension hospitalized for acute on chronic systolic heart failure found to have Group A Strep pyogenes septic arthritis of the left knee.  Group A Strep pyogenes septic arthritis of the left knee: Per conversations with pharmacy, we will transition to ceftriaxone to limit the fluid intake. -Continue ceftriaxone 2g every 12 hours [Day 2/14]. He will require parenteral access and OPAT consult if he is discharged before he completes antibiotic therapy. -He will require oral amoxicillin 500 mg three times daily thereafter for 14 days. -Check ESR, CRP by Home Health in 1 week after discharge -Follow-up in ID clinic in 3-4 weeks  We have no further ID recommendations at this time and will sign-off. Please call back for questions.   LOS: 15 days   Charlott Rakes 09/01/2016, 9:22 AM

## 2016-09-01 NOTE — Progress Notes (Signed)
PROGRESS NOTE        PATIENT DETAILS Name: Joshua Massey Age: 41 y.o. Sex: male Date of Birth: 1976-06-07 Admit Date: 08/16/2016 Admitting Physician Rise Patience, MD PCP:Pcp Not In System  Brief Narrative: Joshua Massey is a 41 year old male with history of nonischemic cardiomyopathy, last EF 35-40% in 2014, who presentedto the ERfor evaluation of massive anasarca felt to be due to decompensated systolic heart failure and nephrotic syndrome. He was unfortunately noncompliant to diuretics as an outpatient. He was subsequently admitted and started on IV Lasix with significant improvement. Hospital course complicated by development of acute septic arthritis of his left knee. See below for further details.  Subjective: No major issues overnight-stable left knee pain.  Assessment/Plan: Massive anasarca due to acute on chronic systolic heart failureand diabetic nephropathy:Volume status markedly improved. -29.8 L so far, weight has decreased to 307 pounds (365 pounds on admission). Given worsening creatinine, remains of diuretics for the past 24 hours, weight is stable. Plans are to restart diuretics depending on electrolytes tomorrow.Continue to follow electrolytes, weights and intake/output. Continue aspirin, carvedilol, isosorbide, hydralazine, noACEIdue to AKI. CHF team now following.    Left Knee septic arthritis: Initially thought to have acute gouty arthritis-synovial fluid negative for crystals, culture positive for group A streptococcus. He was seen by orthopedics, and underwent left knee washout on 3/13. Initially on empiric vancomycin, but now has been transitioned to Rocephin after definite culture data has been obtained. Infectious disease following,recommendations are to continue IV antibiotics for a total of 2 weeks, and then to transition to oral amoxicillin for 2 more weeks. Given renal function, will consult IR to place midline PICC.  Hopefully if he continues to improve, we can discharge him home on 3/16.   Pericardial effusion:No sign of tamponade. Continue diuresis.   Acute renal failure:Not sure if this is due to cardiorenal syndrome, does appear to have significant amount of proteinuria-which is likely due to diabetic nephropathy. Creatinine slightly elevated than his usual baseline, diuretics currently on hold.Continue to monitor. Will likely need to follow with a nephrologist outpatient.   Essential hypertension:Stable. Continuecarvedilol, hydralazine and isosorbide.   Insulin dependent DM Type 2:CBG's slightly on the higher side today, increase Lantus to 10 units, continue SSI.   Anemia of iron deficiency:Continue Iron supplement.   Left lower extremity wound:Continue local wound care. Continue tylenol or norco for pain.  DVT Prophylaxis: Prophylactic Heparin   Code Status: Full code   Family Communication: None at bedside  Disposition Plan: Remain inpatient-hopefully home with home health services in the next day or two.  Antimicrobial agents: Anti-infectives    Start     Dose/Rate Route Frequency Ordered Stop   09/01/16 1100  cefTRIAXone (ROCEPHIN) 2 g in dextrose 5 % 50 mL IVPB     2 g 100 mL/hr over 30 Minutes Intravenous Every 12 hours 09/01/16 0937     08/31/16 1600  penicillin G potassium 2 Million Units in dextrose 5 % 50 mL IVPB  Status:  Discontinued    Comments:  May adjust for renal dosing.   2 Million Units 100 mL/hr over 30 Minutes Intravenous Every 4 hours 08/31/16 1435 09/01/16 0937   08/31/16 1500  vancomycin (VANCOCIN) 1,500 mg in sodium chloride 0.9 % 500 mL IVPB  Status:  Discontinued     1,500 mg 250 mL/hr over 120  Minutes Intravenous Every 24 hours 08/31/16 1204 08/31/16 1434   08/30/16 2300  vancomycin (VANCOCIN) 1,250 mg in sodium chloride 0.9 % 250 mL IVPB  Status:  Discontinued     1,250 mg 166.7 mL/hr over 90 Minutes Intravenous Every 12 hours 08/30/16 1129  08/30/16 1730   08/30/16 1545  vancomycin (VANCOCIN) 1,500 mg in sodium chloride 0.9 % 250 mL IVPB  Status:  Discontinued     1,500 mg 125 mL/hr over 120 Minutes Intravenous To Surgery 08/30/16 1530 08/30/16 1538   08/30/16 1545  vancomycin (VANCOCIN) 1,500 mg in sodium chloride 0.9 % 500 mL IVPB     1,500 mg 250 mL/hr over 120 Minutes Intravenous To Surgery 08/30/16 1536 08/30/16 1745   08/30/16 1130  vancomycin (VANCOCIN) 2,500 mg in sodium chloride 0.9 % 500 mL IVPB  Status:  Discontinued     2,500 mg 250 mL/hr over 120 Minutes Intravenous  Once 08/30/16 1129 08/31/16 1204   08/17/16 1000  doxycycline (VIBRAMYCIN) 100 mg in dextrose 5 % 250 mL IVPB  Status:  Discontinued     100 mg 125 mL/hr over 120 Minutes Intravenous Every 12 hours 08/17/16 0235 08/17/16 1307   08/17/16 0015  doxycycline (VIBRA-TABS) tablet 100 mg     100 mg Oral  Once 08/17/16 0002 08/17/16 0107      Procedures: 2/28>>Echocardiogram: - Left ventricle: The cavity size was normal. Wall thickness was increased in a pattern of mild LVH. Systolic function was severely reduced. The estimated ejection fraction was in the range of 25% to 30%. No apical thrombus with Defininty contrast. Diffuse hypokinesis. The study is not technically sufficient to allow evaluation of LV diastolic function. - Ventricular septum: Septal motion showed paradox. The contour showed diastolic flattening and systolic flattening. - Mitral valve: Mildly thickened leaflets . There was trivial regurgitation. - Left atrium: Moderately dilated. - Right ventricle: Poorly visualized. The cavity size was moderately dilated. - Right atrium: Moderately dilated. - Pulmonic valve: There was mild regurgitation. - Pulmonary arteries: PA peak  pressure: 56 mm Hg (S). - Inferior vena cava: The vessel was dilated. The respirophasic diameter changes were blunted (<50%), consistent with elevated central venous pressure. - Pericardium, extracardiac: Cannot exclude tamponade physiology - clinical correlation is advised. - Moderate size pericardial effusion  3/12>>Left knee arthrocentesis 3/13>> arthroscopy left knee   CONSULTS:  cardiology   ID  Ortho  Time spent: 25- minutes-Greater than 50% of this time was spent in counseling, explanation of diagnosis, planning of further management, and coordination of care.  MEDICATIONS: Scheduled Meds: . aspirin EC  81 mg Oral Daily  . carvedilol  25 mg Oral BID WC  . cefTRIAXone (ROCEPHIN)  IV  2 g Intravenous Q12H  . ferrous sulfate  325 mg Oral BID WC  . heparin  5,000 Units Subcutaneous Q8H  . hydrALAZINE  50 mg Oral Q8H  . insulin aspart  0-9 Units Subcutaneous TID WC  . insulin glargine  5 Units Subcutaneous QHS  . isosorbide mononitrate  60 mg Oral Daily  . living well with diabetes book   Does not apply Once  . pantoprazole  40 mg Oral Daily   Continuous Infusions:  PRN Meds:.fentaNYL (SUBLIMAZE) injection, hydrALAZINE, HYDROcodone-acetaminophen, midazolam, morphine injection, ondansetron **OR** ondansetron (ZOFRAN) IV   PHYSICAL EXAM: Vital signs: Vitals:   08/31/16 0614 08/31/16 1412 08/31/16 2120 09/01/16 0444  BP: (!) 139/100 118/80 (!) 125/91 (!) 138/96  Pulse: 85 82 (!) 109 81  Resp: 18 18 18  18  Temp: 97.8 F (36.6 C) 98.6 F (37 C) 98.4 F (36.9 C) 98.7 F (37.1 C)  TempSrc: Axillary Oral Oral Oral  SpO2: 96% 98% 95% 96%  Weight:    (!) 139.3 kg (307 lb)  Height:       Filed Weights   08/29/16 0525 08/30/16 0551 09/01/16 0444  Weight: (!) 143.2 kg (315 lb 9.6 oz) (!) 139.8 kg (308 lb 3.3 oz) (!) 139.3 kg (307 lb)   Body mass index is 42.82 kg/m.   General appearance :Awake,  alert, not in any distress. Speech Clear.  Eyes:, pupils equally reactive to light and accomodation,no scleral icterus. HEENT: Atraumatic and Normocephalic Neck: supple, no JVD. No cervical lymphadenopathy. No thyromegaly Resp:Good air entry bilaterally, no added sounds  CVS: S1 S2 regular, no murmurs.  GI: Bowel sounds present, Non tender and not distended with no gaurding, rigidity or rebound. Extremities: B/L Lower Ext shows + edema, both legs are warm to touch. Left knee area wrapped-did not open. Neurology:  speech clear,Non focal, sensation is grossly intact. Psychiatric: Normal judgment and insight. Alert and oriented x 3.  Musculoskeletal:No digital cyanosis   I have personally reviewed following labs and imaging studies  LABORATORY DATA: CBC:  Recent Labs Lab 08/26/16 0326 08/27/16 0319 09/01/16 0229  WBC 11.5* 11.0* 10.1  HGB 10.7* 10.8* 10.2*  HCT 33.5* 33.0* 31.8*  MCV 87.7 87.3 88.1  PLT 469* 467* 488*    Basic Metabolic Panel:  Recent Labs Lab 08/26/16 0326  08/28/16 0447 08/29/16 0155 08/30/16 0236 08/31/16 0148 09/01/16 0229  NA 136  < > 133* 133* 134* 130* 132*  K 4.1  < > 4.0 4.0 4.5 5.0 4.8  CL 101  < > 96* 92* 92* 90* 93*  CO2 26  < > 29 30 30 28  33*  GLUCOSE 203*  < > 179* 173* 226* 241* 168*  BUN 41*  < > 43* 45* 53* 59* 61*  CREATININE 2.19*  < > 2.20* 2.27* 2.38* 2.49* 2.60*  CALCIUM 8.0*  < > 8.3* 8.4* 8.2* 8.0* 8.0*  MG 1.8  --  2.0 1.9  --   --   --   < > = values in this interval not displayed.  GFR: Estimated Creatinine Clearance: 54.4 mL/min (A) (by C-G formula based on SCr of 2.6 mg/dL (H)).  Liver Function Tests:  Recent Labs Lab 08/26/16 0326  AST 33  ALT 27  ALKPHOS 348*  BILITOT 1.2  PROT 6.7  ALBUMIN 1.3*   No results for input(s): LIPASE, AMYLASE in the last 168 hours. No results for input(s): AMMONIA in the last 168 hours.  Coagulation Profile: No results for input(s): INR, PROTIME in the last 168  hours.  Cardiac Enzymes: No results for input(s): CKTOTAL, CKMB, CKMBINDEX, TROPONINI in the last 168 hours.  BNP (last 3 results) No results for input(s): PROBNP in the last 8760 hours.  HbA1C: No results for input(s): HGBA1C in the last 72 hours.  CBG:  Recent Labs Lab 08/31/16 0558 08/31/16 1129 08/31/16 1621 08/31/16 2148 09/01/16 0648  GLUCAP 224* 202* 276* 210* 154*    Lipid Profile: No results for input(s): CHOL, HDL, LDLCALC, TRIG, CHOLHDL, LDLDIRECT in the last 72 hours.  Thyroid Function Tests: No results for input(s): TSH, T4TOTAL, FREET4, T3FREE, THYROIDAB in the last 72 hours.  Anemia Panel: No results for input(s): VITAMINB12, FOLATE, FERRITIN, TIBC, IRON, RETICCTPCT in the last 72 hours.  Urine analysis:    Component Value Date/Time  COLORURINE AMBER (A) 08/17/2016 0129   APPEARANCEUR HAZY (A) 08/17/2016 0129   LABSPEC 1.016 08/17/2016 0129   PHURINE 5.0 08/17/2016 0129   GLUCOSEU 50 (A) 08/17/2016 0129   HGBUR SMALL (A) 08/17/2016 0129   BILIRUBINUR NEGATIVE 08/17/2016 0129   KETONESUR NEGATIVE 08/17/2016 0129   PROTEINUR >=300 (A) 08/17/2016 0129   UROBILINOGEN 0.2 09/02/2009 0935   NITRITE NEGATIVE 08/17/2016 0129   LEUKOCYTESUR NEGATIVE 08/17/2016 0129    Sepsis Labs: Lactic Acid, Venous No results found for: LATICACIDVEN  MICROBIOLOGY: Recent Results (from the past 240 hour(s))  Gram stain     Status: None   Collection Time: 08/29/16  8:58 AM  Result Value Ref Range Status   Specimen Description SYNOVIAL RIGHT KNEE  Final   Special Requests NONE  Final   Gram Stain   Final    ABUNDANT WBC PRESENT, PREDOMINANTLY PMN NO ORGANISMS SEEN    Report Status 08/29/2016 FINAL  Final  Culture, body fluid-bottle     Status: Abnormal (Preliminary result)   Collection Time: 08/29/16  8:58 AM  Result Value Ref Range Status   Specimen Description SYNOVIAL RIGHT KNEE  Final   Special Requests BOTTLES DRAWN AEROBIC AND ANAEROBIC 5CC  Final    Gram Stain   Final    GRAM POSITIVE COCCI IN CHAINS IN BOTH AEROBIC AND ANAEROBIC BOTTLES CRITICAL RESULT CALLED TO, READ BACK BY AND VERIFIED WITH: Dionicio Stall RN, AT [phone] 08/30/16 BY D. VANHOOK    Culture (A)  Final    GROUP A STREP (S.PYOGENES) ISOLATED SUSCEPTIBILITIES TO FOLLOW    Report Status PENDING  Incomplete  Gram stain     Status: None   Collection Time: 08/30/16  3:18 PM  Result Value Ref Range Status   Specimen Description SYNOVIAL LEFT KNEE  Final   Special Requests NONE  Final   Gram Stain   Final    ABUNDANT WBC PRESENT, PREDOMINANTLY PMN RARE GRAM POSITIVE COCCI IN PAIRS    Report Status 08/30/2016 FINAL  Final  Culture, body fluid-bottle     Status: None (Preliminary result)   Collection Time: 08/30/16  3:18 PM  Result Value Ref Range Status   Specimen Description SYNOVIAL LEFT KNEE  Final   Special Requests BOTTLES DRAWN AEROBIC AND ANAEROBIC 10CC  Final   Gram Stain   Final    GRAM POSITIVE COCCI IN PAIRS IN CHAINS IN BOTH AEROBIC AND ANAEROBIC BOTTLES    Culture NO GROWTH < 24 HOURS  Final   Report Status PENDING  Incomplete    RADIOLOGY STUDIES/RESULTS: Dg Chest 2 View  Result Date: 08/16/2016 CLINICAL DATA:  Shortness of breath tonight. EXAM: CHEST  2 VIEW COMPARISON:  02/14/2012 FINDINGS: Moderate-to-marked cardiomegaly which is stable. Central vascular congestion and pulmonary edema. Probable left pleural effusion and minimal fluid in the fissures. No pneumothorax or confluent airspace disease. IMPRESSION: Findings consistent with CHF. Electronically Signed   By: Jeb Levering M.D.   On: 08/16/2016 22:01   Dg Knee 1-2 Views Left  Result Date: 08/29/2016 CLINICAL DATA:  Pain and swelling. EXAM: LEFT KNEE - 1-2 VIEW COMPARISON:  08/22/2013. FINDINGS: Prominent knee joint effusion noted. No evidence of fracture or dislocation. IMPRESSION: Prominent knee joint effusion.  No acute bony abnormality. Electronically Signed   By: Marcello Moores  Register   On:  08/29/2016 16:32   US Renal  Result Date: 08/17/2016 CLINICAL DATA:  Acute onset of renal insufficiency. Initial encounter. EXAM: RENAL / URINARY TRACT ULTRASOUND COMPLETE COMPARISON:  CT of  the abdomen and pelvis from 09/02/2009 FINDINGS: Right Kidney: Length: 12.6 cm. Increased parenchymal echogenicity noted. No mass or hydronephrosis visualized. Left Kidney: Length: 11.6 cm. Increased parenchymal echogenicity noted. No mass or hydronephrosis visualized. Difficult to fully assess due to overlying structures. Bladder: Appears normal for degree of bladder distention. IMPRESSION: 1. No evidence of hydronephrosis. 2. Increased renal parenchymal echogenicity may reflect medical renal disease. Electronically Signed   By: Garald Balding M.D.   On: 08/17/2016 06:39     LOS: 79 days   Oren Binet, MD  [facility] Pager:[phone]  If 7PM-7AM, please contact night-coverage [email or web address] Password [email or web address] 09/01/2016, 10:17 AM

## 2016-09-01 NOTE — Progress Notes (Signed)
[facility]  Joshua Massey is a new pt with [facility] this hospital admission.  [facility] will provide HH RN, PT, OT, HH Aide and [facility] [place] for IV ABX upon DC to home.   [facility] Coordinator will provide in hospital teaching with pt and family to prepare for DC and independence with IV ABX at home.  If patient discharges after hours, please call [phone].   [name] 09/01/2016, 3:35 PM

## 2016-09-01 NOTE — Progress Notes (Signed)
PHARMACY CONSULT NOTE FOR:  OUTPATIENT  PARENTERAL ANTIBIOTIC THERAPY (OPAT)  Indication: septic arthritis due to group A strep Regimen: ceftriaxone 2g IV daily through 3/27 followed by amoxicillin 500mg  po TID for an additional 2 weeks   IV and PO antibiotic discharge orders are pended. To discharging provider:  please sign these orders via discharge navigator,  Select New Orders & click on the button choice - Manage This Unsigned Work.     Thank you for allowing pharmacy to be a part of this patient's care.  Candie Mile 09/01/2016, 4:44 PM

## 2016-09-01 NOTE — Progress Notes (Signed)
Physical Therapy Treatment Patient Details Name: Joshua Massey MRN: [id number] DOB: Oct 14, 1975 Today's Date: 09/01/2016    History of Present Illness 41 yo male with nonischemic cardiomyopthy, EF 35 to 40%, presents with worsening dyspnea and lower extremity edema, CHF exacerbation and pericardial effusion. PMHx: DM, HTN, obesity. Pt s/p I and D of L knee 3/14.    PT Comments    Pt with improved gait quality, but has been unable to increase gait distance in the last week of PT.  Pt can be self limiting at times and needs motivation to work with PT. Pt reports he may go home tomorrow, but did not feel up to attempting stairs.  PT verbally reviewed safe stair technique with pt.  Have now changed d/c recommendation to HHPT as pt continues to need MIN A with bed mobility and feel that pt is at high risk for going home and having a decline in mobility without [facility] services.    Follow Up Recommendations  Home health PT     Equipment Recommendations  None recommended by PT    Recommendations for Other Services       Precautions / Restrictions Precautions Precautions: Fall Precaution Comments: Educated on importance of mobility Restrictions Weight Bearing Restrictions: No    Mobility  Bed Mobility Overal bed mobility: Needs Assistance Bed Mobility: Supine to Sit;Sit to Supine     Supine to sit: Min assist Sit to supine: Min assist   General bed mobility comments: HOB elevated, minA for L LE management in/out of the bed. due to pain  Transfers Overall transfer level: Needs assistance Equipment used: Rolling walker (2 wheeled) Transfers: Sit to/from Stand Sit to Stand: Min guard         General transfer comment: good recall of hand placement with transfer  Ambulation/Gait Ambulation/Gait assistance: Min guard Ambulation Distance (Feet): 150 Feet Assistive device: Rolling walker (2 wheeled) Gait Pattern/deviations: Antalgic;Wide base of support Gait velocity: increased  as gait progressed Gait velocity interpretation: Below normal speed for age/gender General Gait Details: Focused on sequencing and keeping RW closer to him today with pt able to WB through L LE, but 8/10 pain   Stairs Stairs:  (declined, verbally reviewed)          Wheelchair Mobility    Modified Rankin (Stroke Patients Only)       Balance     Sitting balance-Leahy Scale: Good       Standing balance-Leahy Scale: Fair Standing balance comment: needs RW for gait                    Cognition Arousal/Alertness: Awake/alert Behavior During Therapy: WFL for tasks assessed/performed Overall Cognitive Status: Within Functional Limits for tasks assessed                 General Comments: pt self limiting    Exercises General Exercises - Lower Extremity Heel Slides: Left;5 reps;AROM    General Comments General comments (skin integrity, edema, etc.): Pt very focused on the way his leg was re-wrapped, with wrap going from below knee to foot.  He wishes it to only be wrapped at knee, although does report he has wounds on calf.  Nursing informed.      Pertinent Vitals/Pain Pain Assessment: 0-10 Pain Score: 8  Pain Location: L LE  Pain Descriptors / Indicators: Sharp Pain Intervention(s): Limited activity within patient's tolerance;Monitored during session;Repositioned    Home Living  Prior Function            PT Goals (current goals can now be found in the care plan section) Acute Rehab PT Goals Patient Stated Goal: home PT Goal Formulation: With patient Time For Goal Achievement: 09/08/16 Potential to Achieve Goals: Good Progress towards PT goals: Not progressing toward goals - comment (unable to increase ambulation distance or attempt stairs)    Frequency    Min 3X/week      PT Plan Discharge plan needs to be updated    Co-evaluation             End of Session Equipment Utilized During Treatment: Gait  belt Activity Tolerance: Patient limited by pain Patient left: in bed;with call bell/phone within reach Nurse Communication: Mobility status PT Visit Diagnosis: Unsteadiness on feet (R26.81);Muscle weakness (generalized) (M62.81);Difficulty in walking, not elsewhere classified (R26.2);Pain Pain - Right/Left: Left Pain - part of body: Leg     Time: 7858-8502 PT Time Calculation (min) (ACUTE ONLY): 23 min  Charges:  $Gait Training: 8-22 mins $Therapeutic Activity: 8-22 mins                    G Codes:       Muadh Creasy LUBECK 09/01/2016, 2:30 PM

## 2016-09-01 NOTE — Progress Notes (Signed)
Inpatient Diabetes Program Recommendations  AACE/ADA: New Consensus Statement on Inpatient Glycemic Control (2015)  Target Ranges:  Prepandial:   less than 140 mg/dL      Peak postprandial:   less than 180 mg/dL (1-2 hours)      Critically ill patients:  140 - 180 mg/dL   Lab Results  Component Value Date   GLUCAP 154 (H) 09/01/2016   HGBA1C 11.2 (H) 08/17/2016    Review of Glycemic Control Results for Joshua Massey, Joshua Massey (MRN [id number]) as of 09/01/2016 09:56  Ref. Range 08/31/2016 05:58 08/31/2016 11:29 08/31/2016 16:21 08/31/2016 21:48 09/01/2016 06:48  Glucose-Capillary Latest Ref Range: 65 - 99 mg/dL 224 (H) 202 (H) 276 (H) 210 (H) 154 (H)   Inpatient Diabetes Program Recommendations:  Noted post prandial CBGs elevated and noted poor appetite. Please consider: -Increase Novolog correction to 0-15 tid -Add Novolog 3 units meal coverage tid when eating improved eating 50% or greater  Thank you, Bethena Roys E. Aquanetta Schwarz, RN, MSN, CDE Inpatient Glycemic Control Team Team Pager [phone] (8am-5pm) 09/01/2016 9:59 AM

## 2016-09-01 NOTE — Procedures (Signed)
RIJV tunelled PICC SVC RA No comp/EBL

## 2016-09-01 NOTE — Progress Notes (Signed)
Advanced Heart Failure Rounding Note  PCP: No PCP Primary Cardiologist: Dr. Gwenlyn Found   Subjective:    Admitted on 08/16/16 with acute on chronic combined systolic and diastolic CHF (EF 62-03%), weight gain of about 50 pounds with medical and dietary noncompliance. He has been seen in the past by the CHF team in 2014, but did not follow up. Diuresis sluggish so CHF team was consulted on 08/26/16.   Has diuresed well on IV Lasix + metolazone.  Weight down 58 pounds since admission. Creatinine 2.19->2.10->2.20->2.27->2.38->2.49->2.6. IV lasix stopped yesterday.   s/p  left knee arthroscopy on 3/13 for septic knee, synovial fluid growing Strep pyogenes.   Feels well today, has been working with PT. Denies orthopnea.   Objective:   Weight Range: (!) 307 lb (139.3 kg) Body mass index is 42.82 kg/m.   Vital Signs:   Temp:  [98.4 F (36.9 C)-98.7 F (37.1 C)] 98.7 F (37.1 C) (03/15 0444) Pulse Rate:  [81-109] 81 (03/15 0444) Resp:  [18] 18 (03/15 0444) BP: (118-138)/(80-96) 138/96 (03/15 0444) SpO2:  [95 %-98 %] 96 % (03/15 0444) Weight:  [307 lb (139.3 kg)] 307 lb (139.3 kg) (03/15 0444) Last BM Date: 08/31/16  Weight change: Filed Weights   08/29/16 0525 08/30/16 0551 09/01/16 0444  Weight: (!) 315 lb 9.6 oz (143.2 kg) (!) 308 lb 3.3 oz (139.8 kg) (!) 307 lb (139.3 kg)    Intake/Output:   Intake/Output Summary (Last 24 hours) at 09/01/16 0733 Last data filed at 09/01/16 0444  Gross per 24 hour  Intake                0 ml  Output             3600 ml  Net            -3600 ml     Physical Exam: General: Obese male in NAD.  HEENT: normal, poor dentition  Neck: supple. JVP 6-7cm  Carotids 2+ bilat; no bruits. No lymphadenopathy or thyromegaly appreciated. Cor: PMI nondisplaced. Reg. Rate and rhythm, no murmurs.   Lungs: Clear in all lobes.   Abdomen: soft, non tender, obese,nontender.. No hepatosplenomegaly. No bruits or masses. Good bowel sounds. Extremities: no  cyanosis, clubbing, rash. Trace pretibial edema, 1+ ankle edema.  Neuro: alert & orientedx3, cranial nerves grossly intact. moves all 4 extremities w/o difficulty. Affect pleasant   Telemetry (personally reviewed): NSR in the 80's   Labs: Basic Metabolic Panel  Recent Labs  08/31/16 0148 09/01/16 0229  NA 130* 132*  K 5.0 4.8  CL 90* 93*  CO2 28 33*  GLUCOSE 241* 168*  BUN 59* 61*  CREATININE 2.49* 2.60*  CALCIUM 8.0* 8.0*    BNP: BNP (last 3 results)  Recent Labs  08/16/16 2055  BNP 2,062.6*   Transthoracic Echocardiography 08/17/16 Study Conclusions  - Procedure narrative: Transthoracic echocardiography. Image quality was adequate. Intravenous contrast (Definity) was administered. - Left ventricle: The cavity size was normal. Wall thickness was increased in a pattern of mild LVH. Systolic function was severely reduced. The estimated ejection fraction was in the range of 25% to 30%. No apical thrombus with Defininty contrast. Diffuse hypokinesis. The study is not technically sufficient to allow evaluation of LV diastolic function. - Ventricular septum: Septal motion showed paradox. The contour showed diastolic flattening and systolic flattening. - Mitral valve: Mildly thickened leaflets . There was trivial regurgitation. - Left atrium: Moderately dilated. - Right ventricle: Poorly visualized. The cavity size was moderately  dilated. - Right atrium: Moderately dilated. - Pulmonic valve: There was mild regurgitation. - Pulmonary arteries: PA peak pressure: 56 mm Hg (S). - Inferior vena cava: The vessel was dilated. The respirophasic diameter changes were blunted (<50%), consistent with elevated central venous pressure. - Pericardium, extracardiac: Cannot exclude tamponade physiology  clinical correlation is advised.  Impressions:  - Compared to a prior study in 2014, the LVEF is lower at 25-30%. There is now a moderate size  pericardial effusion - tamponade physiology cannot be excluded, especially in the setting of moderate pulmonary hypertension with signs of volume and pressure overload of the RV.   Medications:     Scheduled Medications: . aspirin EC  81 mg Oral Daily  . carvedilol  25 mg Oral BID WC  . ferrous sulfate  325 mg Oral BID WC  . heparin  5,000 Units Subcutaneous Q8H  . hydrALAZINE  50 mg Oral Q8H  . insulin aspart  0-9 Units Subcutaneous TID WC  . insulin glargine  5 Units Subcutaneous QHS  . isosorbide mononitrate  60 mg Oral Daily  . living well with diabetes book   Does not apply Once  . pantoprazole  40 mg Oral Daily  . pencillin G potassium IV  2 Million Units Intravenous Q4H    PRN Medications: fentaNYL (SUBLIMAZE) injection, hydrALAZINE, HYDROcodone-acetaminophen, midazolam, morphine injection, ondansetron **OR** ondansetron (ZOFRAN) IV   Assessment/Plan   1. Acute on chronic combined systolic and diastolic CHF: NYHA IIIB, first diagnosed in 2013,EF 25-30% this admission, presented with volume overload. Currently on IV Lasix 160mg  BID. Weight down 57 pounds from admission, volume status improved and now BUN/creatinine rising.  IV diuretics stopped yesterday.  NICM - had normal cors in 2015, LVH on Echo this admission, but cavity size normal.  - Continue Imdur to 60 mg daily.  - Continue hydralazine 50 mg TID.   - Continue Coreg 25 mg BID.  - Renal function continues to rise slightly, would hold off on adding po diuretics today. Can start torsemide 60 mg daily tomorrow. Would avoid BID dosing with compliance issues.  - Will not be able to use Dig, Arlyce Harman or [place] with elevated creatinine.  2. AKI on ?CKD: of note, in 2014 Creatinine was 1.04, on admit creatinine is 2.49. Renal ultrasound on 2/28 without evidence of hydronephrosis. Does not currently have a nephrologist. -  Creatinine is 2.38->2.49->2.6 today.   - K is up to 4.8 today, no supplement.  - Will follow  with daily BMET.   - No ACE or ARB currently, avoiding nephrotoxic agents.  - Will need nephrology followup as outpatient.  3. Uncontrolled DM type 2: A1c is 11.2.  - Needs PCP to manage.  - Continue SSI - Discussed diet modifications, limiting carbohydrates in his snacks.  4. HTN: Well controlled on current regimen.  5. Noncompliance: Discussed with patient the importance of adhering to his medication regimen.  6. Morbid Obesity: Body mass index is 48.61 kg/m. We discussed the need for efforts at weight loss at home.  7. Moderate sized pericardial effusion: Had moderate pericardial effusion by Echo but no tamponade.  Hopefully this is improving with diuresis.  8. Left septic arthritis: s/p left knee arthroscopy and wash out. Now growing Strep pyogenes. On Penicillin.  He will be getting a central access and will go home on 2 weeks of antibiotics (PCN). No blood cultures drawn, can't draw now that he has started antibiotics. Spoke with Dr. Linus Salmons, he does not feel that a TEE  is warranted as a endocarditis would be rare with group A strep.  Referral to paramedicine done, they plan to follow him at discharge. He will need close follow up in the CHF clinic, plan to see him weekly at first.    Length of Stay: [place], NP  09/01/2016, 7:33 AM  Advanced Heart Failure Team Pager [phone] (M-F; 7a - 4p)  Please contact [facility] for night-coverage after hours (4p -7a ) and weekends on [email or web address]  Patient seen with NP, agree with the above note.  He is now off IV diuretics, volume status much improved.  Creatinine up slightly today to 2.6.  - If creatinine plateaus/begins to decrease, start torsemide 60 mg daily tomorrow.  - Continue current Coreg + hydralazine/nitrates.  - Treatment for GAS septic arthritis as outlined above.  - Needs nephrology followup as outpatient.   Loralie Champagne 09/01/2016 9:19 AM

## 2016-09-01 NOTE — Progress Notes (Signed)
PT Cancellation Note  Patient Details Name: Joshua Massey MRN: [id number] DOB: 08-19-1975   Cancelled Treatment:    Reason Eval/Treat Not Completed: Patient at procedure or test/unavailable. Will check back as schedule permits.   Tatyanna Cronk LUBECK 09/01/2016, 11:59 AM

## 2016-09-02 ENCOUNTER — Ambulatory Visit: Payer: Self-pay | Admitting: Family Medicine

## 2016-09-02 LAB — GLUCOSE, CAPILLARY
GLUCOSE-CAPILLARY: 174 mg/dL — AB (ref 65–99)
GLUCOSE-CAPILLARY: 187 mg/dL — AB (ref 65–99)
Glucose-Capillary: 193 mg/dL — ABNORMAL HIGH (ref 65–99)

## 2016-09-02 LAB — BASIC METABOLIC PANEL
Anion gap: 9 (ref 5–15)
BUN: 57 mg/dL — ABNORMAL HIGH (ref 6–20)
CALCIUM: 8 mg/dL — AB (ref 8.9–10.3)
CO2: 29 mmol/L (ref 22–32)
CREATININE: 2.39 mg/dL — AB (ref 0.61–1.24)
Chloride: 95 mmol/L — ABNORMAL LOW (ref 101–111)
GFR calc Af Amer: 38 mL/min — ABNORMAL LOW (ref >60)
GFR calc non Af Amer: 33 mL/min — ABNORMAL LOW (ref >60)
Glucose, Bld: 179 mg/dL — ABNORMAL HIGH (ref 65–99)
Potassium: 4.3 mmol/L (ref 3.5–5.1)
Sodium: 133 mmol/L — ABNORMAL LOW (ref 135–145)

## 2016-09-02 MED ORDER — GLUCOSE BLOOD VI STRP: ORAL_STRIP | 0 refills | Status: AC

## 2016-09-02 MED ORDER — ISOSORBIDE MONONITRATE ER 60 MG PO TB24
90.0000 mg | ORAL_TABLET | Freq: Every day | ORAL | Status: DC
  Administered 2016-09-02: 90 mg via ORAL
  Filled 2016-09-02: qty 1

## 2016-09-02 MED ORDER — HYDRALAZINE HCL 50 MG PO TABS: 75.0000 mg | ORAL_TABLET | Freq: Three times a day (TID) | ORAL | 0 refills | Status: DC

## 2016-09-02 MED ORDER — PANTOPRAZOLE SODIUM 40 MG PO TBEC: 40.0000 mg | DELAYED_RELEASE_TABLET | Freq: Every day | ORAL | 0 refills | Status: DC

## 2016-09-02 MED ORDER — ISOSORBIDE MONONITRATE ER 60 MG PO TB24: 90.0000 mg | ORAL_TABLET | Freq: Every day | ORAL | 0 refills | Status: DC

## 2016-09-02 MED ORDER — FREESTYLE LANCETS MISC: 0 refills | Status: AC

## 2016-09-02 MED ORDER — CARVEDILOL 25 MG PO TABS: 25.0000 mg | ORAL_TABLET | Freq: Two times a day (BID) | ORAL | 0 refills | Status: DC

## 2016-09-02 MED ORDER — TORSEMIDE 20 MG PO TABS
60.0000 mg | ORAL_TABLET | Freq: Every day | ORAL | Status: DC
  Administered 2016-09-02: 60 mg via ORAL
  Filled 2016-09-02: qty 3

## 2016-09-02 MED ORDER — FREESTYLE SYSTEM KIT: 1.0000 | PACK | Freq: Three times a day (TID) | 1 refills | Status: AC

## 2016-09-02 MED ORDER — INSULIN PEN NEEDLE 32G X 8 MM MISC: 0 refills | Status: AC

## 2016-09-02 MED ORDER — TORSEMIDE 20 MG PO TABS: 60.0000 mg | ORAL_TABLET | Freq: Every day | ORAL | 0 refills | Status: DC

## 2016-09-02 MED ORDER — HYDROCODONE-ACETAMINOPHEN 10-325 MG PO TABS: 1.0000 | ORAL_TABLET | Freq: Four times a day (QID) | ORAL | 0 refills | Status: DC | PRN

## 2016-09-02 MED ORDER — GLUCOSE BLOOD VI STRP: ORAL_STRIP | 0 refills | Status: DC

## 2016-09-02 MED ORDER — INSULIN GLARGINE 100 UNIT/ML SOLOSTAR PEN: 15.0000 [IU] | PEN_INJECTOR | SUBCUTANEOUS | 0 refills | Status: DC

## 2016-09-02 MED ORDER — CEFTRIAXONE IV (FOR PTA / DISCHARGE USE ONLY): 2.0000 g | INTRAVENOUS | 0 refills | Status: AC

## 2016-09-02 MED ORDER — INSULIN ASPART 100 UNIT/ML FLEXPEN: PEN_INJECTOR | SUBCUTANEOUS | 0 refills | Status: DC

## 2016-09-02 MED ORDER — FERROUS SULFATE 325 (65 FE) MG PO TABS: 325.0000 mg | ORAL_TABLET | Freq: Two times a day (BID) | ORAL | 0 refills | Status: DC

## 2016-09-02 MED ORDER — HYDRALAZINE HCL 50 MG PO TABS
75.0000 mg | ORAL_TABLET | Freq: Three times a day (TID) | ORAL | Status: DC
  Administered 2016-09-02: 75 mg via ORAL
  Filled 2016-09-02: qty 1

## 2016-09-02 MED ORDER — AMOXICILLIN 500 MG PO CAPS: 500.0000 mg | ORAL_CAPSULE | Freq: Three times a day (TID) | ORAL | 0 refills | Status: DC

## 2016-09-02 NOTE — Discharge Summary (Signed)
PATIENT DETAILS Name: Joshua Massey Age: 41 y.o. Sex: male Date of Birth: 1975-10-17 MRN: [id number]. Admitting Physician: Rise Patience, MD PCP:Pcp Not In System  Admit Date: 08/16/2016 Discharge date: 09/02/2016  Recommendations for Outpatient Follow-up:  1. Follow up with PCP in 1-2 weeks 2. Please obtain labs as indicated below-see medication list. 3. Stop date of IV Rocephin 3/27, will require 2 weeks of amoxicillin after Rocephin completed. 4. Please refer patient to nephrology. 5. Please ensure patient follow-up with Dr. Zeb Comfort. 6. Please ensure follow up with infectious disease.  Admitted From:  Home  Disposition: Home with home health services   Camp: Yes  Equipment/Devices: Mid Line PICC line--placed 3/15  Discharge Condition: Stable  CODE STATUS: FULL CODE  Diet recommendation:  Heart Healthy / Carb Modified   Brief Summary: See H&P, Labs, Consult and Test reports for all details in brief,Joshua Massey is a 41 year old male with history of nonischemic cardiomyopathy, last EF 35-40% in 2014, who presentedto the ERfor evaluation of massive anasarca felt to be due to decompensated systolic heart failure and nephrotic syndrome. He was unfortunately noncompliant to diuretics as an outpatient. He was subsequently admitted and started on IV Lasix with significant improvement. Hospital course complicated by development of acute septic arthritis of his left knee. See below for further details.  Brief Hospital Course: Massive anasarca due to acute on chronic systolic heart failureand diabetic nephropathy:Volume status markedly improved. -29.9 L so far, weight has decreased to 305 pounds (365 pounds on admission).  patient was followed closely by CHF team, recommendations are to transition to oral Demadex on discharge. Patient has a follow-up appointment with the CHF clinic, he has been asked to keep this appointment. He was counseled  extensively regarding importance of compliance to medications. Continue aspirin, carvedilol, isosorbide, hydralazine, noACEIdue to AKI.   Left Knee septic arthritis: Initially thought to have acute gouty arthritis-synovial fluid negative for crystals, culture positive for group A streptococcus. He was seen by orthopedics, and underwent left knee washout on 3/13. Initially on empiric vancomycin, but now has been transitioned to Rocephin after definite culture data has been obtained. Infectious disease following,recommendations are to continue IV antibiotics for a total of 2 weeks, and then to transition to oral amoxicillin for 2 more weeks. Still has some pain in his left knee, but markedly better-he has been provided a prescription for a few more days of oral narcotics.  Pericardial effusion:No sign of tamponade. Continue diuresis.   Acute renal failure:Not sure if this is due to cardiorenal syndrome, does appear to have significant amount of proteinuria-which is likely due to diabetic nephropathy. Creatinine slightly up trended, but has started to downtrend again after IV diuretics were held. 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   Essential hypertension:Stable. Continuecarvedilol, hydralazine and isosorbide.   Insulin dependent DM Type 2:CBG's slightly on the higher side today, continue Lantus and SSI on discharge. Patient was instructed to keep a log of his CBGs, and take it to his PCPs office for further optimization. He was educated about the symptoms of hypoglycemia.  Anemia of iron deficiency:Continue Iron supplement.   Left lower extremity wound:Continue local wound care-HHRN arranged.  Procedures/Studies: 2/28>>Echocardiogram: - Left ventricle: The cavity size was normal. Wall thickness was increased in a pattern of mild LVH. Systolic  function was severely reduced. The estimated ejection fraction was in the range of  25% to 30%. No apical thrombus with Defininty contrast. Diffuse hypokinesis. The study is not technically sufficient to allow evaluation of LV diastolic function. - Ventricular septum: Septal motion showed paradox. The contour showed diastolic flattening and systolic flattening. - Mitral valve: Mildly thickened leaflets . There was trivial regurgitation. - Left atrium: Moderately dilated. - Right ventricle: Poorly visualized. The cavity size was moderately dilated. - Right atrium: Moderately dilated. - Pulmonic valve: There was mild regurgitation. - Pulmonary arteries: PA peak pressure: 56 mm Hg (S). - Inferior vena cava: The vessel was dilated. The respirophasic diameter changes were blunted (<50%), consistent with elevated central venous pressure. - Pericardium, extracardiac: Cannot exclude tamponade physiology - clinical correlation is advised. - Moderate size pericardial effusion  3/12>>Left knee arthrocentesis 3/13>> arthroscopy left knee   Discharge Diagnoses:  Principal Problem:   Acute on chronic systolic CHF (congestive heart failure) (HCC) Active Problems:   Essential hypertension   CHF (congestive heart failure) (HCC)   Type 2 diabetes mellitus with vascular disease (HCC)   Acute renal failure (HCC)   Acute systolic CHF (congestive heart failure) (HCC)   Peripheral edema   AKI (acute kidney injury) (HCC)   CKD (chronic kidney disease) stage 4, GFR 15-29 ml/min (HCC)   Arthritis, septic ([place])   Discharge Instructions:  Activity:  As tolerated with Full fall precautions use walker/cane & assistance as needed   Discharge Instructions     (HEART FAILURE PATIENTS) Call MD:  Anytime you have any of the following symptoms: 1) 3 pound weight gain in 24 hours or 5 pounds in 1 week 2) shortness of breath, with or without a dry hacking cough 3) swelling in the hands, feet or stomach 4) if you have to sleep on extra pillows at night in order to breathe.    Complete by:  As directed    Diet - low sodium heart healthy    Complete by:  As directed    Diet Carb Modified    Complete by:  As directed    Discharge instructions    Complete by:  As directed    Follow with Primary MD in 1 week  Follow with the CHF clinic in 3/23  DO NOT TAKE ADVIL, ALEVE, MOTRIN OR ANY OTHER nonsteroidal anti-inflammatory medications  Please get a complete blood count and chemistry panel checked by your Primary MD at your next visit, and again as instructed by your Primary MD.  Get Medicines reviewed and adjusted: Please take all your medications with you for your next visit with your Primary MD  Laboratory/radiological data: Please request your Primary MD to go over all hospital tests and procedure/radiological results at the follow up, please ask your Primary MD to get all Hospital records sent to his/her office.  In some cases, they will be blood work, cultures and biopsy results pending at the time of your discharge. Please request that your primary care M.D. follows up on these results.  Also Note the following: If you experience worsening of your admission symptoms, develop shortness of breath, life threatening emergency, suicidal or homicidal thoughts you must seek medical attention immediately by calling 911 or calling your MD immediately  if symptoms less severe.  You must read complete instructions/literature along with all the possible adverse reactions/side effects for all the Medicines you take and that have been prescribed to you. Take any new Medicines after you have completely understood and accpet all the possible adverse reactions/side  effects.   Do not drive when taking  Pain medications or sleeping medications (Benzodaizepines)  Do not take more than prescribed Pain, Sleep and Anxiety Medications. It is not advisable to combine anxiety,sleep and pain medications without talking with your primary care practitioner  Special Instructions: If you have smoked or chewed Tobacco  in the last 2 yrs please stop smoking, stop any regular Alcohol  and or any Recreational drug use.  Wear Seat belts while driving.  Please note: You were cared for by a hospitalist during your hospital stay. Once you are discharged, your primary care physician will handle any further medical issues. Please note that NO REFILLS for any discharge medications will be authorized once you are discharged, as it is imperative that you return to your primary care physician (or establish a relationship with a primary care physician if you do not have one) for your post hospital discharge needs so that they can reassess your need for medications and monitor your lab values.   Heart Failure patients record your daily weight using the same scale at the same time of day    Complete by:  As directed    Home infusion instructions [place] May follow [place] Dosing Protocol; May administer Cathflo as needed to maintain patency of vascular access device.; Flushing of vascular access device: per [facility] Protocol: 0.9% NaCl pre/post medica...    Complete by:  As directed    Instructions:  May follow [place] Dosing Protocol   Instructions:  May administer Cathflo as needed to maintain patency of vascular access device.   Instructions:  Flushing of vascular access device: per [facility] Protocol: 0.9% NaCl pre/post medication administration and prn patency; Heparin 100 u/ml, 108m for implanted ports and Heparin 10u/ml, 543mfor all other central venous catheters.   Instructions:  May follow [facility] Anaphylaxis Protocol for First Dose Administration in the home: 0.9% NaCl at  25-50 ml/hr to maintain IV access for protocol meds. Epinephrine 0.3 ml IV/IM PRN and Benadryl 25-50 IV/IM PRN s/s of anaphylaxis.   Instructions:  [facility] Coordinator (RN) to assist per patient IV care needs in the home PRN.   Increase activity slowly    Complete by:  As directed    STOP any activity that causes chest pain, shortness of breath, dizziness, sweating, or exessive weakness    Complete by:  As directed      Allergies as of 09/02/2016   No Known Allergies     Medication List    STOP taking these medications   digoxin 0.25 MG tablet Commonly known as:  LANOXIN   furosemide 40 MG tablet Commonly known as:  LASIX   losartan 100 MG tablet Commonly known as:  COZAAR   spironolactone 25 MG tablet Commonly known as:  ALDACTONE     TAKE these medications   acetaminophen 500 MG tablet Commonly known as:  TYLENOL Take 1,000 mg by mouth every 6 (six) hours as needed for moderate pain or headache.   amoxicillin 500 MG capsule Commonly known as:  AMOXIL Take 1 capsule (500 mg total) by mouth 3 (three) times daily. Start taking on:  09/14/2016   aspirin 81 MG tablet Take 81 mg by mouth daily.   carvedilol 25 MG tablet Commonly known as:  COREG Take 1 tablet (25 mg total) by mouth 2 (two) times daily with a meal.   cefTRIAXone IVPB Commonly known as:  ROCEPHIN Inject 2 g into the vein daily. Indication:  Septic arthritis Last Day of Therapy:  09/13/16 Labs -  Once weekly:  CBC/D and BMP, Labs - Every other week:  ESR and CRP   ferrous sulfate 325 (65 FE) MG tablet Take 1 tablet (325 mg total) by mouth 2 (two) times daily with a meal.   freestyle lancets Use as instructed   glucose blood test strip Commonly known as:  FREESTYLE TEST STRIPS Use as instructed What changed:  See the new instructions.   glucose monitoring kit monitoring kit 1 each by Does not apply route 4 (four) times daily - after meals and at bedtime. 1 month Diabetic Testing  Supplies for QAC-QHS accuchecks.   hydrALAZINE 50 MG tablet Commonly known as:  APRESOLINE Take 1.5 tablets (75 mg total) by mouth 3 (three) times daily. What changed:  how much to take  when to take this   HYDROcodone-acetaminophen 10-325 MG tablet Commonly known as:  NORCO Take 1 tablet by mouth every 6 (six) hours as needed for severe pain.   insulin aspart 100 UNIT/ML FlexPen Commonly known as:  NOVOLOG FLEXPEN 0-15 Units, Subcutaneous, 3 times daily with meals CBG < 70: implement hypoglycemia protocol-call MD CBG 70 - 120: 0 units CBG 121 - 150: 2 units CBG 151 - 200: 3 units CBG 201 - 250: 5 units CBG 251 - 300: 8 units CBG 301 - 350: 11 units CBG 351 - 400: 15 units CBG > 400:Call MD   Insulin Glargine 100 UNIT/ML Solostar Pen Commonly known as:  LANTUS SOLOSTAR Inject 15 Units into the skin every morning. And pen needles 1/day What changed:  how much to take   Insulin Pen Needle 32G X 8 MM Misc Use as directed   isosorbide mononitrate 60 MG 24 hr tablet Commonly known as:  IMDUR Take 1.5 tablets (90 mg total) by mouth daily. What changed:  medication strength  how much to take   pantoprazole 40 MG tablet Commonly known as:  PROTONIX Take 1 tablet (40 mg total) by mouth daily. What changed:  See the new instructions.   torsemide 20 MG tablet Commonly known as:  DEMADEX Take 3 tablets (60 mg total) by mouth daily. Start taking on:  09/03/2016            Home Infusion Instuctions        Start     Ordered   09/02/16 0000  Home infusion instructions [facility] May follow [place] Dosing Protocol; May administer Cathflo as needed to maintain patency of vascular access device.; Flushing of vascular access device: per [facility] Protocol: 0.9% NaCl pre/post medica...    Question Answer Comment  Instructions May follow [facility] Dosing Protocol   Instructions May administer Cathflo as needed to maintain patency of vascular access device.     Instructions Flushing of vascular access device: per [facility] Protocol: 0.9% NaCl pre/post medication administration and prn patency; Heparin 100 u/ml, 13m for implanted ports and Heparin 10u/ml, 551mfor all other central venous catheters.   Instructions May follow [facility] Anaphylaxis Protocol for First Dose Administration in the home: 0.9% NaCl at 25-50 ml/hr to maintain IV access for protocol meds. Epinephrine 0.3 ml IV/IM PRN and Benadryl 25-50 IV/IM PRN s/s of anaphylaxis.   Instructions [facility] Infusion Coordinator (RN) to assist per patient IV care needs in the home PRN.      09/02/16 1214       Durable Medical Equipment        Start     Ordered   08/23/16 1444  For home use only  DME Walker rolling  Once    Comments:  Bariatric walker needed, weight 361 pounds.  Question:  Patient needs a walker to treat with the following condition  Answer:  Weakness   08/23/16 1444     Follow-up Information    Betty [name], MD. Go on 09/02/2016.   Specialty:  Family Medicine Why:  at 7:45 am. Please bring insurance cards and photo ID. If you do not call 24 hours ahead and do not go, you will be charged $50.  Contact information: [place] [address] [phone]        [facility] Follow up.   Why:  HHRN/PT/OT/aide arranged- will also provide IV abx-2 wks-  Contact information: [place] [address] [phone]        Renette Butters, MD Follow up in 2 week(s).   Specialty:  Orthopedic Surgery Contact information: [address] [place] [address] [phone]        Darrick Grinder, NP Follow up on 09/14/2016.   Specialty:  Cardiology Why:  at 9:30am for hospital follow up. Garage code is [id number]  Contact information: [address] [place] [address] [phone]          No Known Allergies  Consultations:   cardiology, ID and orthopedic surgery  Other  Procedures/Studies: Dg Chest 2 View  Result Date: 08/16/2016 CLINICAL DATA:  Shortness of breath tonight. EXAM: CHEST  2 VIEW COMPARISON:  02/14/2012 FINDINGS: Moderate-to-marked cardiomegaly which is stable. Central vascular congestion and pulmonary edema. Probable left pleural effusion and minimal fluid in the fissures. No pneumothorax or confluent airspace disease. IMPRESSION: Findings consistent with CHF. Electronically Signed   By: Jeb Levering M.D.   On: 08/16/2016 22:01   Dg Knee 1-2 Views Left  Result Date: 08/29/2016 CLINICAL DATA:  Pain and swelling. EXAM: LEFT KNEE - 1-2 VIEW COMPARISON:  08/22/2013. FINDINGS: Prominent knee joint effusion noted. No evidence of fracture or dislocation. IMPRESSION: Prominent knee joint effusion.  No acute bony abnormality. Electronically Signed   By: Marcello Moores  Register   On: 08/29/2016 16:32   US Renal  Result Date: 08/17/2016 CLINICAL DATA:  Acute onset of renal insufficiency. Initial encounter. EXAM: RENAL / URINARY TRACT ULTRASOUND COMPLETE COMPARISON:  CT of the abdomen and pelvis from 09/02/2009 FINDINGS: Right Kidney: Length: 12.6 cm. Increased parenchymal echogenicity noted. No mass or hydronephrosis visualized. Left Kidney: Length: 11.6 cm. Increased parenchymal echogenicity noted. No mass or hydronephrosis visualized. Difficult to fully assess due to overlying structures. Bladder: Appears normal for degree of bladder distention. IMPRESSION: 1. No evidence of hydronephrosis. 2. Increased renal parenchymal echogenicity may reflect medical renal disease. Electronically Signed   By: Garald Balding M.D.   On: 08/17/2016 06:39   Ir Fluoro Guide Cv Line Right  Result Date: 09/01/2016 INDICATION: Foot infection EXAM: TUNNELED PICC CATHETER PLACEMENT, ULTRASOUND GUIDANCE FOR VASCULAR ACCESS MEDICATIONS: None; The antibiotic was administered within an appropriate time interval prior to skin puncture. ANESTHESIA/SEDATION: None. FLUOROSCOPY TIME:  Fluoroscopy  Time:  minutes 24 seconds (14 mGy). COMPLICATIONS: None immediate. PROCEDURE: Informed written consent was obtained from the patient after a thorough discussion of the procedural risks, benefits and alternatives. All questions were addressed. Maximal Sterile Barrier Technique was utilized including caps, mask, sterile gowns, sterile gloves, sterile drape, hand hygiene and skin antiseptic. A timeout was performed prior to the initiation of the procedure. The right neck was prepped with ChloraPrep in a sterile fashion, and  a sterile drape was applied covering the operative field. A sterile gown and sterile gloves were used for the procedure. 1% lidocaine into the skin and subcutaneous tissue. The right jugular vein was noted to be patent initially with ultrasound. Under sonographic guidance, a micropuncture needle was inserted into the right IJ vein (Ultrasound and fluoroscopic image documentation was performed). It was removed over an 018 wire which was up-sized to an Amplatz. This was advanced into the IVC. A small incision was made in the right upper chest. The tunneling device was utilized to advance the from the chest incision and out the neck incision. A peel-away sheath was advanced over the Amplatz wire. The PICC was cut to the measured length. The leading edge of the catheter was then advanced through the peel-away sheath. The peel-away sheath was removed. It was flushed and instilled with heparin. The chest incision was closed with a 0 Prolene pursestring stitch. The neck incision was closed with a 4-0 Vicryl subcuticular stitch. FINDINGS: Tip of the PICC is at the cavoatrial junction. IMPRESSION: Successful right IJ vein tunneled PICC with its tip in the cavoatrial junction. Electronically Signed   By: Marybelle Killings M.D.   On: 09/01/2016 14:26   Ir US Guide Vasc Access Right  Result Date: 09/01/2016 INDICATION: Foot infection EXAM: TUNNELED PICC CATHETER PLACEMENT, ULTRASOUND GUIDANCE FOR VASCULAR ACCESS  MEDICATIONS: None; The antibiotic was administered within an appropriate time interval prior to skin puncture. ANESTHESIA/SEDATION: None. FLUOROSCOPY TIME:  Fluoroscopy Time:  minutes 24 seconds (14 mGy). COMPLICATIONS: None immediate. PROCEDURE: Informed written consent was obtained from the patient after a thorough discussion of the procedural risks, benefits and alternatives. All questions were addressed. Maximal Sterile Barrier Technique was utilized including caps, mask, sterile gowns, sterile gloves, sterile drape, hand hygiene and skin antiseptic. A timeout was performed prior to the initiation of the procedure. The right neck was prepped with ChloraPrep in a sterile fashion, and a sterile drape was applied covering the operative field. A sterile gown and sterile gloves were used for the procedure. 1% lidocaine into the skin and subcutaneous tissue. The right jugular vein was noted to be patent initially with ultrasound. Under sonographic guidance, a micropuncture needle was inserted into the right IJ vein (Ultrasound and fluoroscopic image documentation was performed). It was removed over an 018 wire which was up-sized to an Amplatz. This was advanced into the IVC. A small incision was made in the right upper chest. The tunneling device was utilized to advance the from the chest incision and out the neck incision. A peel-away sheath was advanced over the Amplatz wire. The PICC was cut to the measured length. The leading edge of the catheter was then advanced through the peel-away sheath. The peel-away sheath was removed. It was flushed and instilled with heparin. The chest incision was closed with a 0 Prolene pursestring stitch. The neck incision was closed with a 4-0 Vicryl subcuticular stitch. FINDINGS: Tip of the PICC is at the cavoatrial junction. IMPRESSION: Successful right IJ vein tunneled PICC with its tip in the cavoatrial junction. Electronically Signed   By: Marybelle Killings M.D.   On: 09/01/2016  14:26     TODAY-DAY OF DISCHARGE:  Subjective:   Joshua Massey today has no headache,no chest abdominal pain,no new weakness tingling or numbness, feels much better wants to go home today.   Objective:   Blood pressure (!) 131/93, pulse 89, temperature 97.8 F (36.6 C), temperature source Oral, resp. rate 18, height 5' 11"  (1.803  m), weight (!) 138.4 kg (305 lb 1.6 oz), SpO2 95 %.  Intake/Output Summary (Last 24 hours) at 09/02/16 1218 Last data filed at 09/02/16 1036  Gross per 24 hour  Intake             1870 ml  Output             2301 ml  Net             -431 ml   Filed Weights   08/30/16 0551 09/01/16 0444 09/02/16 0616  Weight: (!) 139.8 kg (308 lb 3.3 oz) (!) 139.3 kg (307 lb) (!) 138.4 kg (305 lb 1.6 oz)    Exam: Awake Alert, Oriented *3, No new F.N deficits, Normal affect [place].AT,PERRAL Supple Neck,No JVD, No cervical lymphadenopathy appriciated.  Symmetrical Chest wall movement, Good air movement bilaterally, CTAB RRR,No Gallops,Rubs or new Murmurs, No Parasternal Heave +ve B.Sounds, Abd Soft, Non tender, No organomegaly appriciated, No rebound -guarding or rigidity. No Cyanosis, Clubbing or edema, No new Rash or bruise   PERTINENT RADIOLOGIC STUDIES: Dg Chest 2 View  Result Date: 08/16/2016 CLINICAL DATA:  Shortness of breath tonight. EXAM: CHEST  2 VIEW COMPARISON:  02/14/2012 FINDINGS: Moderate-to-marked cardiomegaly which is stable. Central vascular congestion and pulmonary edema. Probable left pleural effusion and minimal fluid in the fissures. No pneumothorax or confluent airspace disease. IMPRESSION: Findings consistent with CHF. Electronically Signed   By: Jeb Levering M.D.   On: 08/16/2016 22:01   Dg Knee 1-2 Views Left  Result Date: 08/29/2016 CLINICAL DATA:  Pain and swelling. EXAM: LEFT KNEE - 1-2 VIEW COMPARISON:  08/22/2013. FINDINGS: Prominent knee joint effusion noted. No evidence of fracture or dislocation. IMPRESSION: Prominent knee joint  effusion.  No acute bony abnormality. Electronically Signed   By: Marcello Moores  Register   On: 08/29/2016 16:32   US Renal  Result Date: 08/17/2016 CLINICAL DATA:  Acute onset of renal insufficiency. Initial encounter. EXAM: RENAL / URINARY TRACT ULTRASOUND COMPLETE COMPARISON:  CT of the abdomen and pelvis from 09/02/2009 FINDINGS: Right Kidney: Length: 12.6 cm. Increased parenchymal echogenicity noted. No mass or hydronephrosis visualized. Left Kidney: Length: 11.6 cm. Increased parenchymal echogenicity noted. No mass or hydronephrosis visualized. Difficult to fully assess due to overlying structures. Bladder: Appears normal for degree of bladder distention. IMPRESSION: 1. No evidence of hydronephrosis. 2. Increased renal parenchymal echogenicity may reflect medical renal disease. Electronically Signed   By: Garald Balding M.D.   On: 08/17/2016 06:39   Ir Fluoro Guide Cv Line Right  Result Date: 09/01/2016 INDICATION: Foot infection EXAM: TUNNELED PICC CATHETER PLACEMENT, ULTRASOUND GUIDANCE FOR VASCULAR ACCESS MEDICATIONS: None; The antibiotic was administered within an appropriate time interval prior to skin puncture. ANESTHESIA/SEDATION: None. FLUOROSCOPY TIME:  Fluoroscopy Time:  minutes 24 seconds (14 mGy). COMPLICATIONS: None immediate. PROCEDURE: Informed written consent was obtained from the patient after a thorough discussion of the procedural risks, benefits and alternatives. All questions were addressed. Maximal Sterile Barrier Technique was utilized including caps, mask, sterile gowns, sterile gloves, sterile drape, hand hygiene and skin antiseptic. A timeout was performed prior to the initiation of the procedure. The right neck was prepped with ChloraPrep in a sterile fashion, and a sterile drape was applied covering the operative field. A sterile gown and sterile gloves were used for the procedure. 1% lidocaine into the skin and subcutaneous tissue. The right jugular vein was noted to be patent  initially with ultrasound. Under sonographic guidance, a micropuncture needle was inserted into the right IJ  vein (Ultrasound and fluoroscopic image documentation was performed). It was removed over an 018 wire which was up-sized to an Amplatz. This was advanced into the IVC. A small incision was made in the right upper chest. The tunneling device was utilized to advance the from the chest incision and out the neck incision. A peel-away sheath was advanced over the Amplatz wire. The PICC was cut to the measured length. The leading edge of the catheter was then advanced through the peel-away sheath. The peel-away sheath was removed. It was flushed and instilled with heparin. The chest incision was closed with a 0 Prolene pursestring stitch. The neck incision was closed with a 4-0 Vicryl subcuticular stitch. FINDINGS: Tip of the PICC is at the cavoatrial junction. IMPRESSION: Successful right IJ vein tunneled PICC with its tip in the cavoatrial junction. Electronically Signed   By: Marybelle Killings M.D.   On: 09/01/2016 14:26   Ir US Guide Vasc Access Right  Result Date: 09/01/2016 INDICATION: Foot infection EXAM: TUNNELED PICC CATHETER PLACEMENT, ULTRASOUND GUIDANCE FOR VASCULAR ACCESS MEDICATIONS: None; The antibiotic was administered within an appropriate time interval prior to skin puncture. ANESTHESIA/SEDATION: None. FLUOROSCOPY TIME:  Fluoroscopy Time:  minutes 24 seconds (14 mGy). COMPLICATIONS: None immediate. PROCEDURE: Informed written consent was obtained from the patient after a thorough discussion of the procedural risks, benefits and alternatives. All questions were addressed. Maximal Sterile Barrier Technique was utilized including caps, mask, sterile gowns, sterile gloves, sterile drape, hand hygiene and skin antiseptic. A timeout was performed prior to the initiation of the procedure. The right neck was prepped with ChloraPrep in a sterile fashion, and a sterile drape was applied covering the  operative field. A sterile gown and sterile gloves were used for the procedure. 1% lidocaine into the skin and subcutaneous tissue. The right jugular vein was noted to be patent initially with ultrasound. Under sonographic guidance, a micropuncture needle was inserted into the right IJ vein (Ultrasound and fluoroscopic image documentation was performed). It was removed over an 018 wire which was up-sized to an Amplatz. This was advanced into the IVC. A small incision was made in the right upper chest. The tunneling device was utilized to advance the from the chest incision and out the neck incision. A peel-away sheath was advanced over the Amplatz wire. The PICC was cut to the measured length. The leading edge of the catheter was then advanced through the peel-away sheath. The peel-away sheath was removed. It was flushed and instilled with heparin. The chest incision was closed with a 0 Prolene pursestring stitch. The neck incision was closed with a 4-0 Vicryl subcuticular stitch. FINDINGS: Tip of the PICC is at the cavoatrial junction. IMPRESSION: Successful right IJ vein tunneled PICC with its tip in the cavoatrial junction. Electronically Signed   By: Marybelle Killings M.D.   On: 09/01/2016 14:26     PERTINENT LAB RESULTS: CBC:  Recent Labs  09/01/16 0229  WBC 10.1  HGB 10.2*  HCT 31.8*  PLT 488*   CMET CMP     Component Value Date/Time   NA 133 (L) 09/02/2016 0306   K 4.3 09/02/2016 0306   CL 95 (L) 09/02/2016 0306   CO2 29 09/02/2016 0306   GLUCOSE 179 (H) 09/02/2016 0306   BUN 57 (H) 09/02/2016 0306   CREATININE 2.39 (H) 09/02/2016 0306   CREATININE 1.04 01/01/2013 1212   CALCIUM 8.0 (L) 09/02/2016 0306   PROT 6.7 08/26/2016 0326   ALBUMIN 1.3 (L) 08/26/2016 0051  AST 33 08/26/2016 0326   ALT 27 08/26/2016 0326   ALKPHOS 348 (H) 08/26/2016 0326   BILITOT 1.2 08/26/2016 0326   GFRNONAA 33 (L) 09/02/2016 0306   GFRAA 38 (L) 09/02/2016 0306    GFR Estimated Creatinine Clearance:  59 mL/min (A) (by C-G formula based on SCr of 2.39 mg/dL (H)). No results for input(s): LIPASE, AMYLASE in the last 72 hours. No results for input(s): CKTOTAL, CKMB, CKMBINDEX, TROPONINI in the last 72 hours. Invalid input(s): POCBNP No results for input(s): DDIMER in the last 72 hours. No results for input(s): HGBA1C in the last 72 hours. No results for input(s): CHOL, HDL, LDLCALC, TRIG, CHOLHDL, LDLDIRECT in the last 72 hours. No results for input(s): TSH, T4TOTAL, T3FREE, THYROIDAB in the last 72 hours.  Invalid input(s): FREET3 No results for input(s): VITAMINB12, FOLATE, FERRITIN, TIBC, IRON, RETICCTPCT in the last 72 hours. Coags: No results for input(s): INR in the last 72 hours.  Invalid input(s): PT Microbiology: Recent Results (from the past 240 hour(s))  Gram stain     Status: None   Collection Time: 08/29/16  8:58 AM  Result Value Ref Range Status   Specimen Description SYNOVIAL RIGHT KNEE  Final   Special Requests NONE  Final   Gram Stain   Final    ABUNDANT WBC PRESENT, PREDOMINANTLY PMN NO ORGANISMS SEEN    Report Status 08/29/2016 FINAL  Final  Culture, body fluid-bottle     Status: Abnormal (Preliminary result)   Collection Time: 08/29/16  8:58 AM  Result Value Ref Range Status   Specimen Description SYNOVIAL RIGHT KNEE  Final   Special Requests BOTTLES DRAWN AEROBIC AND ANAEROBIC 5CC  Final   Gram Stain   Final    GRAM POSITIVE COCCI IN CHAINS IN BOTH AEROBIC AND ANAEROBIC BOTTLES CRITICAL RESULT CALLED TO, READ BACK BY AND VERIFIED WITH: Dionicio Stall RN, AT [phone] 08/30/16 BY D. VANHOOK    Culture (A)  Final    STREPTOCOCCUS PYOGENES HEALTH DEPARTMENT NOTIFIED VIRIDANS STREPTOCOCCUS    Report Status PENDING  Incomplete   Organism ID, Bacteria STREPTOCOCCUS PYOGENES  Final      Susceptibility   Streptococcus pyogenes - MIC*    ERYTHROMYCIN <=0.12 SENSITIVE Sensitive     TETRACYCLINE <=0.25 SENSITIVE Sensitive     VANCOMYCIN 0.25 SENSITIVE Sensitive      CLINDAMYCIN <=0.25 SENSITIVE Sensitive     Inducible Clindamycin NEGATIVE Sensitive     * STREPTOCOCCUS PYOGENES  Gram stain     Status: None   Collection Time: 08/30/16  3:18 PM  Result Value Ref Range Status   Specimen Description SYNOVIAL LEFT KNEE  Final   Special Requests NONE  Final   Gram Stain   Final    ABUNDANT WBC PRESENT, PREDOMINANTLY PMN RARE GRAM POSITIVE COCCI IN PAIRS    Report Status 08/30/2016 FINAL  Final  Culture, body fluid-bottle     Status: Abnormal   Collection Time: 08/30/16  3:18 PM  Result Value Ref Range Status   Specimen Description SYNOVIAL LEFT KNEE  Final   Special Requests BOTTLES DRAWN AEROBIC AND ANAEROBIC 10CC  Final   Gram Stain   Final    GRAM POSITIVE COCCI IN PAIRS IN CHAINS IN BOTH AEROBIC AND ANAEROBIC BOTTLES    Culture (A)  Final    STREPTOCOCCUS PYOGENES SUSCEPTIBILITIES PERFORMED ON PREVIOUS CULTURE WITHIN THE LAST 5 DAYS. HEALTH DEPARTMENT NOTIFIED    Report Status 09/01/2016 FINAL  Final    FURTHER DISCHARGE INSTRUCTIONS:  Get  Medicines reviewed and adjusted: Please take all your medications with you for your next visit with your Primary MD  Laboratory/radiological data: Please request your Primary MD to go over all hospital tests and procedure/radiological results at the follow up, please ask your Primary MD to get all Hospital records sent to his/her office.  In some cases, they will be blood work, cultures and biopsy results pending at the time of your discharge. Please request that your primary care M.D. goes through all the records of your hospital data and follows up on these results.  Also Note the following: If you experience worsening of your admission symptoms, develop shortness of breath, life threatening emergency, suicidal or homicidal thoughts you must seek medical attention immediately by calling 911 or calling your MD immediately  if symptoms less severe.  You must read complete instructions/literature along  with all the possible adverse reactions/side effects for all the Medicines you take and that have been prescribed to you. Take any new Medicines after you have completely understood and accpet all the possible adverse reactions/side effects.   Do not drive when taking Pain medications or sleeping medications (Benzodaizepines)  Do not take more than prescribed Pain, Sleep and Anxiety Medications. It is not advisable to combine anxiety,sleep and pain medications without talking with your primary care practitioner  Special Instructions: If you have smoked or chewed Tobacco  in the last 2 yrs please stop smoking, stop any regular Alcohol  and or any Recreational drug use.  Wear Seat belts while driving.  Please note: You were cared for by a hospitalist during your hospital stay. Once you are discharged, your primary care physician will handle any further medical issues. Please note that NO REFILLS for any discharge medications will be authorized once you are discharged, as it is imperative that you return to your primary care physician (or establish a relationship with a primary care physician if you do not have one) for your post hospital discharge needs so that they can reassess your need for medications and monitor your lab values.  Total Time spent coordinating discharge including counseling, education and face to face time equals 45 minutes.  SignedOren Binet 09/02/2016 12:18 PM

## 2016-09-02 NOTE — Progress Notes (Signed)
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 PMHx: DM, HTN, obesity. Pt s/p I and D of L knee 3/14.    PT Comments    Pt pleasant with continued left knee pain limiting function. Pt able to walk in hall and perform a step today with improved mobility. Pt educated for HEP, mobility and function to be able to return home. Pt encouraged to continue to progress mobility daily and limit his static function although pt continues to deny sitting up in chair. Pt able to perform pericare on his own after toileting.      Follow Up Recommendations  Home health PT     Equipment Recommendations  Rolling walker with 5" wheels    Recommendations for Other Services       Precautions / Restrictions Precautions Precautions: Fall Restrictions Weight Bearing Restrictions: Yes    Mobility  Bed Mobility Overal bed mobility: Modified Independent Bed Mobility: Supine to Sit;Sit to Supine           General bed mobility comments: reliance on rail and HOB 15 degrees, momentum to perform transitional movement  Transfers Overall transfer level: Needs assistance   Transfers: Sit to/from Stand Sit to Stand: Supervision         General transfer comment: decreased descent control  Ambulation/Gait Ambulation/Gait assistance: Min guard Ambulation Distance (Feet): 250 Feet Assistive device: Rolling walker (2 wheeled) Gait Pattern/deviations: Step-through pattern;Decreased stance time - left   Gait velocity interpretation: Below normal speed for age/gender General Gait Details: cues for sequence with good body position in Rw   Stairs Stairs: Yes   Stair Management: Step to pattern;Forwards;With walker;One rail Right Number  of Stairs: 1 General stair comments: pt educated for stairs backward with RW and initiated but unable to complete stating this sequence is too painful. Pt able to perform with right rail and left hand on stabilized RW  Wheelchair Mobility    Modified Rankin (Stroke Patients Only)       Balance                                    Cognition Arousal/Alertness: Awake/alert Behavior During Therapy: WFL for tasks assessed/performed Overall Cognitive Status: Within Functional Limits for tasks assessed                 General Comments: pt self limiting    Exercises General Exercises - Lower Extremity Long Arc Quad: AROM;Both;Seated;15 reps Hip Flexion/Marching: AROM;15 reps;Both;Seated    General Comments        Pertinent Vitals/Pain Pain Score: 8  Pain Location: left knee Pain Descriptors / Indicators: Aching Pain Intervention(s): Limited activity within patient's tolerance;Premedicated before session;Monitored during session;Repositioned;Ice applied    Home Living                      Prior Function            PT Goals (current goals can now be found in the care plan section) Progress towards PT goals: Progressing toward goals    Frequency           PT Plan Current plan remains appropriate    Co-evaluation  End of Session Equipment Utilized During Treatment: Gait belt Activity Tolerance: Patient limited by pain Patient left: in bed;with call bell/phone within reach Nurse Communication: Mobility status PT Visit Diagnosis: Muscle weakness (generalized) (M62.81);Difficulty in walking, not elsewhere classified (R26.2);Pain Pain - Right/Left: Left Pain - part of body: Leg     Time: 7159-5396 PT Time Calculation (min) (ACUTE ONLY): 29 min  Charges:  $Gait Training: 8-22 mins $Therapeutic Exercise: 8-22 mins                    G Codes:       Meylin Stenzel B Saina Waage Sep 23, 2016, 10:52 AM Elwyn Reach,  [place]

## 2016-09-02 NOTE — Progress Notes (Signed)
Advanced Heart Failure Rounding Note  PCP: No PCP Primary Cardiologist: Dr. Gwenlyn Found   Subjective:    Admitted on 08/16/16 with acute on chronic combined systolic and diastolic CHF (EF 56-31%), weight gain of about 50 pounds with medical and dietary noncompliance. He has been seen in the past by the CHF team in 2014, but did not follow up. Diuresis sluggish so CHF team was consulted on 08/26/16.   Has diuresed well on IV Lasix + metolazone.  Weight down 60 pounds since admission. Creatinine 2.19->2.10->2.20->2.27->2.38->2.49->2.6->2.39. IV lasix stopped 08/31/16.   s/p  left knee arthroscopy on 3/13 for septic knee, synovial fluid growing Strep pyogenes. ID has seen, plan for 2 weeks of ceftriaxone at home.   Feels well today, complaining of knee pain. Has walked with PT, will home PT as well.   Objective:   Weight Range: (!) 305 lb 1.6 oz (138.4 kg) Body mass index is 42.55 kg/m.   Vital Signs:   Temp:  [97.8 F (36.6 C)-98.2 F (36.8 C)] 97.8 F (36.6 C) (03/16 0616) Pulse Rate:  [82-87] 87 (03/16 0616) Resp:  [18] 18 (03/16 0616) BP: (130-137)/(78-99) 137/86 (03/16 0620) SpO2:  [95 %-98 %] 95 % (03/16 0616) Weight:  [305 lb 1.6 oz (138.4 kg)] 305 lb 1.6 oz (138.4 kg) (03/16 0616) Last BM Date: 09/01/16  Weight change: Filed Weights   08/30/16 0551 09/01/16 0444 09/02/16 0616  Weight: (!) 308 lb 3.3 oz (139.8 kg) (!) 307 lb (139.3 kg) (!) 305 lb 1.6 oz (138.4 kg)    Intake/Output:   Intake/Output Summary (Last 24 hours) at 09/02/16 0706 Last data filed at 09/02/16 4970  Gross per 24 hour  Intake             2230 ml  Output             2676 ml  Net             -446 ml     Physical Exam: General: Obese male in NAD. Lying in bed.  HEENT:Poor dentition Neck: Supple, no JVP  Carotids 2+ bilat; no bruits. No lymphadenopathy or thyromegaly appreciated. Cor: PMI nondisplaced. RRR, no murmurs.   Lungs: CTA    Abdomen: soft, non tender, Obese, nontender No  hepatosplenomegaly. No bruits or masses. Good bowel sounds. Extremities: no cyanosis, clubbing, rash. No edema. Right knee with ACE wrap.  Neuro: alert & orientedx3, cranial nerves grossly intact. moves all 4 extremities w/o difficulty. Affect pleasant   Telemetry (personally reviewed): NSR  Labs: Basic Metabolic Panel  Recent Labs  09/01/16 0229 09/02/16 0306  NA 132* 133*  K 4.8 4.3  CL 93* 95*  CO2 33* 29  GLUCOSE 168* 179*  BUN 61* 57*  CREATININE 2.60* 2.39*  CALCIUM 8.0* 8.0*    BNP: BNP (last 3 results)  Recent Labs  08/16/16 2055  BNP 2,062.6*   Transthoracic Echocardiography 08/17/16 Study Conclusions  - Procedure narrative: Transthoracic echocardiography. Image quality was adequate. Intravenous contrast (Definity) was administered. - Left ventricle: The cavity size was normal. Wall thickness was increased in a pattern of mild LVH. Systolic function was severely reduced. The estimated ejection fraction was in the range of 25% to 30%. No apical thrombus with Defininty contrast. Diffuse hypokinesis. The study is not technically sufficient to allow evaluation of LV diastolic function. - Ventricular septum: Septal motion showed paradox. The contour showed diastolic flattening and systolic flattening. - Mitral valve: Mildly thickened leaflets . There was trivial regurgitation. -  Left atrium: Moderately dilated. - Right ventricle: Poorly visualized. The cavity size was moderately dilated. - Right atrium: Moderately dilated. - Pulmonic valve: There was mild regurgitation. - Pulmonary arteries: PA peak pressure: 56 mm Hg (S). - Inferior vena cava: The vessel was dilated. The respirophasic diameter changes were blunted (<50%), consistent with elevated central venous pressure. - Pericardium, extracardiac: Cannot exclude tamponade physiology  clinical correlation is advised.  Impressions:  - Compared to a prior study in 2014,  the LVEF is lower at 25-30%. There is now a moderate size pericardial effusion - tamponade physiology cannot be excluded, especially in the setting of moderate pulmonary hypertension with signs of volume and pressure overload of the RV.   Medications:     Scheduled Medications: . aspirin EC  81 mg Oral Daily  . carvedilol  25 mg Oral BID WC  . cefTRIAXone (ROCEPHIN)  IV  2 g Intravenous Q24H  . ferrous sulfate  325 mg Oral BID WC  . heparin  5,000 Units Subcutaneous Q8H  . hydrALAZINE  50 mg Oral Q8H  . insulin aspart  0-9 Units Subcutaneous TID WC  . insulin glargine  5 Units Subcutaneous QHS  . isosorbide mononitrate  60 mg Oral Daily  . living well with diabetes book   Does not apply Once  . pantoprazole  40 mg Oral Daily    PRN Medications: fentaNYL (SUBLIMAZE) injection, hydrALAZINE, HYDROcodone-acetaminophen, lidocaine (PF), midazolam, morphine injection, ondansetron **OR** ondansetron (ZOFRAN) IV   Assessment/Plan   1. Acute on chronic combined systolic and diastolic CHF: NYHA IIIB, first diagnosed in 2013,EF 25-30% this admission, presented with volume overload. Weight down 60 pounds from admission, volume status improved. IV diuretics stopped on 08/31/16.   NICM - had normal cors in 2015, LVH on Echo this admission, but cavity size normal.  - Increase Imdur to 90 mg daily.  - Increase hydralazine to 75 mg tid.    - Continue Coreg 25 mg BID.  - Start torsemide 60mg  daily today.  - Will not be able to use Dig, Arlyce Harman or [place] with elevated creatinine.  2. AKI on ?CKD: of note, in 2014 Creatinine was 1.04, on admit creatinine is 2.49. Renal ultrasound on 2/28 without evidence of hydronephrosis. Does not currently have a nephrologist. -  Creatinine is 2.38->2.49->2.6->2.39 today.   - K is 4.3 today.  - Will follow with daily BMET.   - No ACE or ARB currently, avoiding nephrotoxic agents.  - Needs nephrology consult outpatient.  3. Uncontrolled DM type 2: A1c  is 11.2.  - Needs PCP to manage.  - Continue SSI - Discussed diet modifications, limiting carbohydrates in his snacks.  4. HTN: Well controlled on current meds.  5. Noncompliance: Discussed with patient the importance of adhering to his medication regimen.  6. Morbid Obesity: Body mass index is 48.61 kg/m. We discussed the need for efforts at weight loss at home.  7. Moderate sized pericardial effusion: Had moderate pericardial effusion by Echo but no tamponade.  Hopefully this is improving with diuresis.  8. Left septic arthritis: s/p left knee arthroscopy and wash out. Now growing Strep pyogenes. On Penicillin.  He will be getting a central access and will go home on 2 weeks of antibiotics - ceftriaxone and then transition to po Amoxicillin.  No blood cultures drawn, can't draw now that he has started antibiotics. Spoke with Dr. Linus Salmons, he does not feel that a TEE is warranted as a endocarditis would be rare with group A strep. He  will have home PT and OT as well as a [facility] aide.   Referral to paramedicine done, they plan to follow him at discharge. He will need close follow up in the CHF clinic, plan to see him weekly at first.   Length of Stay: [place], NP  09/02/2016, 7:06 AM  Advanced Heart Failure Team Pager [phone] (M-F; 7a - 4p)  Please contact [facility] for night-coverage after hours (4p -7a ) and weekends on [email or web address]  Patient seen with NP, agree with the above note.  Doing well except for ongoing left knee pain.  Volume status looks good, weight down 60 lbs.  Creatinine has stabilized.   - Would increase hydralazine and Imdur as above.  - Start torsemide 60 mg daily.   If he goes home today, will need followup in CHF clinic.  Will give the following cardiac meds for home: hydralazine 75 mg tid, Imdur 90 daily, torsemide 60 daily, Coreg 25 mg bid.    Loralie Champagne 09/02/2016 9:00 AM

## 2016-09-02 NOTE — Care Management Note (Addendum)
Case Management Note Previous CM note initiated by Carles Collet, RN 08/18/2016, 3:00 PM  Patient Details  Name: Joshua Massey MRN: [id number] Date of Birth: [birth year]  Subjective/Objective:                 Patient independent from home, diuresing with lasix. Patient states he srives to appointments, denies difficulties paying for meds. Spoke with PT, state patient would not need HH at this time. CM will continue to follow for DC planning.  Cardiology: Terri Piedra: [place]: Suzie Portela on [address]   Addendum 3/9 14:40 Follow up scheduled with Betty [name] MD [facility] for 3/16 at 8:00 pt to arrive 15 minutes early, entered in AVS. Referral made to [facility] for [facility] RN disease management in 41 yr old with CHF and extensive edema on admission.    Action/Plan:   Expected Discharge Date:                  Expected Discharge Plan:  [place]  In-House Referral:     Discharge planning Services  CM Consult  Post Acute Care Choice:  Durable Medical Equipment, Home Health Choice offered to:  Patient  DME Arranged:  Walker rolling (bariatric, has been delivered to room) DME Agency:  [place]:  RN, PT, OT, Nurse's Aide, IV Antibiotics (needs orders for [facility] RN) [place] Agency:  [place]  Status of Service:  Completed, signed off  If discussed at [place] of Stay Meetings, dates discussed:  3/13  Discharge Disposition: home with home health   Additional Comments:  09/03/15- 1150- [name] RN, CM- pt for d/c home today- has line placed for home IV abx- have notified Pam with [facility] for discharge- [facility] will f/u with pt for IV abx and [place] services.   09/01/16- 1500- [name] RN, CM- pt has developed septic knee during hospital coarse s/p washout- will now need 2 weeks of IV abx- spoke with pt at bedside to confirm d/c plans- per pt he has a girlfriend - [name]- that can assist at home, pt does not remember  selecting [facility] for [facility] agency and asks if he can use [facility]- discussed choice and pt states he would prefer to use [facility] for all services since [facility] will be doing the IV abx. Notified Ad[facility] with [facility] to cancel referral for [facility] and referral made to Santiago Glad with [place] with [facility] following for IV abx needs. [facility] has already delivered RW to room for home use- no other DME needs noted. Orders have been placed for HHRN/PT/OT/aide- pt concerned about wrap on LLE being changed- have asked bedside RN- [name] to f/u with ortho. MD regarding this.   Dahlia Client [place], RN 09/02/2016, 11:49 AM [phone]

## 2016-09-02 NOTE — Progress Notes (Signed)
Pt discharged home with girlfriend and family. IV and telemetry box removed. Pt and pt's girlfriend received discharge instructions and all questions were answered. Pt was instructed on the importance of taking all of his medications as instructed and checking his blood sugars and using the sliding scale provided for insulin maintenance. Pt verbalized understanding. Pt has home health set up. Pt discharged with central line for IV antibiotics. Pt left with all of his belongings. Pt left the unit via wheelchair and was accompanied by pt's girlfriend and this RN.   Grant Fontana BSN, RN

## 2016-09-03 LAB — CULTURE, BODY FLUID W GRAM STAIN -BOTTLE

## 2016-09-03 LAB — CULTURE, BODY FLUID-BOTTLE

## 2016-09-08 ENCOUNTER — Telehealth (HOSPITAL_COMMUNITY): Payer: Self-pay | Admitting: Vascular Surgery

## 2016-09-08 NOTE — Telephone Encounter (Signed)
Spoke to pt  He aware that appt has been moved up to 11:00 AM

## 2016-09-09 ENCOUNTER — Inpatient Hospital Stay (HOSPITAL_COMMUNITY): Admission: RE | Admit: 2016-09-09 | Payer: Self-pay | Source: Ambulatory Visit

## 2016-09-12 ENCOUNTER — Other Ambulatory Visit: Payer: Self-pay | Admitting: Internal Medicine

## 2016-09-12 ENCOUNTER — Ambulatory Visit (INDEPENDENT_AMBULATORY_CARE_PROVIDER_SITE_OTHER): Payer: BLUE CROSS/BLUE SHIELD | Admitting: Internal Medicine

## 2016-09-12 ENCOUNTER — Telehealth: Payer: Self-pay | Admitting: *Deleted

## 2016-09-12 ENCOUNTER — Encounter: Payer: Self-pay | Admitting: Internal Medicine

## 2016-09-12 DIAGNOSIS — Z95828 Presence of other vascular implants and grafts: Secondary | ICD-10-CM | POA: Diagnosis not present

## 2016-09-12 DIAGNOSIS — I5022 Chronic systolic (congestive) heart failure: Secondary | ICD-10-CM

## 2016-09-12 DIAGNOSIS — Z5181 Encounter for therapeutic drug level monitoring: Secondary | ICD-10-CM | POA: Insufficient documentation

## 2016-09-12 DIAGNOSIS — M01X0 Direct infection of unspecified joint in infectious and parasitic diseases classified elsewhere: Principal | ICD-10-CM

## 2016-09-12 DIAGNOSIS — A498 Other bacterial infections of unspecified site: Secondary | ICD-10-CM

## 2016-09-12 DIAGNOSIS — M0080 Arthritis due to other bacteria, unspecified joint: Secondary | ICD-10-CM

## 2016-09-12 DIAGNOSIS — M00262 Other streptococcal arthritis, left knee: Secondary | ICD-10-CM

## 2016-09-12 LAB — CBC AND DIFFERENTIAL
HCT: 32 % — AB (ref 41–53)
Hemoglobin: 9.8 g/dL — AB (ref 13.5–17.5)
Neutrophils Absolute: 5 /uL
Platelets: 503 10*3/uL — AB (ref 150–399)
WBC: 7.4 10*3/mL

## 2016-09-12 LAB — BASIC METABOLIC PANEL
BUN: 58 mg/dL — AB (ref 4–21)
CREATININE: 2.3 mg/dL — AB (ref 0.6–1.3)
GLUCOSE: 222 mg/dL
POTASSIUM: 3.2 mmol/L — AB (ref 3.4–5.3)
SODIUM: 142 mmol/L (ref 137–147)

## 2016-09-12 NOTE — Assessment & Plan Note (Signed)
No issues on labs done at home

## 2016-09-12 NOTE — Assessment & Plan Note (Signed)
Have limited infusion fluid intake by using ceftriaxone and now transition off.

## 2016-09-12 NOTE — Assessment & Plan Note (Signed)
Will arrange to have this removed by IR

## 2016-09-12 NOTE — Progress Notes (Addendum)
   Subjective:    Patient ID: Joshua Massey, male    DOB: Jul 02, 1975, 41 y.o.   MRN: [id number]  HPI Here for follow up of septic arthritis.  Was intially seen and aspiration with just 39,000 WBCs and treated with steroid injection and arthroscopy done the next day and did grow GAS.  He had initially presented for a CHF exacerbation and had been in the hospital for an extended period for fluid removal before his knee began to swell and had been on diuretics.  He then started on ceftriaxone and was continued on that at home for 2 weeks, with two doses left now.  He does have some knee soreness but minimal swelling.  No fever, no chills.  No associated n/v/d/rash.  His recent ESR was 97 and CRP 89.    Review of Systems  Gastrointestinal: Negative for diarrhea and nausea.  Neurological: Negative for dizziness and light-headedness.       Objective:   Physical Exam  Constitutional: He appears well-developed and well-nourished. No distress.  HENT:  Mouth/Throat: No oropharyngeal exudate.  Eyes: No scleral icterus.  Cardiovascular: Normal rate, regular rhythm and normal heart sounds.   No murmur heard. Pulmonary/Chest: Effort normal and breath sounds normal. No respiratory distress.  Musculoskeletal:  Knee with minimal warmth, minimal swelling, good rom with extension and flexion  Skin: No erythema.  Multiple areas of hyperpigmented circular macules that are chronic  Psychiatric: He has a normal mood and affect.    SH: does not smoke or drink alcohol      Assessment & Plan:

## 2016-09-12 NOTE — Assessment & Plan Note (Addendum)
Doing well clinically and will transition to oral amoxicillin for 2 more weeks to complete treatment. His inflammatory markers are still elevated last week but is clinically really doing well so will have him complete as planned and see how he does off of antibiotics.     Will return for any new swelling, fever or other concerns.

## 2016-09-12 NOTE — Telephone Encounter (Signed)
Patient notified of appt at IR at [facility] for 09/16/16 at 9:30 AM. Myrtis Hopping

## 2016-09-13 ENCOUNTER — Ambulatory Visit (HOSPITAL_COMMUNITY)
Admission: RE | Admit: 2016-09-13 | Discharge: 2016-09-13 | Disposition: A | Discharge status: Home / Self Care | Payer: BLUE CROSS/BLUE SHIELD | Source: Ambulatory Visit | Attending: Internal Medicine | Admitting: Internal Medicine

## 2016-09-13 VITALS — BP 110/80 | HR 82 | Wt 278.0 lb

## 2016-09-13 DIAGNOSIS — Z7982 Long term (current) use of aspirin: Secondary | ICD-10-CM | POA: Insufficient documentation

## 2016-09-13 DIAGNOSIS — I5021 Acute systolic (congestive) heart failure: Secondary | ICD-10-CM

## 2016-09-13 DIAGNOSIS — E669 Obesity, unspecified: Secondary | ICD-10-CM | POA: Insufficient documentation

## 2016-09-13 DIAGNOSIS — I429 Cardiomyopathy, unspecified: Secondary | ICD-10-CM | POA: Diagnosis not present

## 2016-09-13 DIAGNOSIS — Z794 Long term (current) use of insulin: Secondary | ICD-10-CM | POA: Diagnosis not present

## 2016-09-13 DIAGNOSIS — E1122 Type 2 diabetes mellitus with diabetic chronic kidney disease: Secondary | ICD-10-CM | POA: Diagnosis not present

## 2016-09-13 DIAGNOSIS — N183 Chronic kidney disease, stage 3 unspecified: Secondary | ICD-10-CM

## 2016-09-13 DIAGNOSIS — Z79899 Other long term (current) drug therapy: Secondary | ICD-10-CM | POA: Insufficient documentation

## 2016-09-13 DIAGNOSIS — I10 Essential (primary) hypertension: Secondary | ICD-10-CM | POA: Diagnosis not present

## 2016-09-13 DIAGNOSIS — I5022 Chronic systolic (congestive) heart failure: Secondary | ICD-10-CM | POA: Diagnosis not present

## 2016-09-13 DIAGNOSIS — I130 Hypertensive heart and chronic kidney disease with heart failure and stage 1 through stage 4 chronic kidney disease, or unspecified chronic kidney disease: Secondary | ICD-10-CM | POA: Insufficient documentation

## 2016-09-13 DIAGNOSIS — Z6838 Body mass index (BMI) 38.0-38.9, adult: Secondary | ICD-10-CM | POA: Diagnosis not present

## 2016-09-13 DIAGNOSIS — Z09 Encounter for follow-up examination after completed treatment for conditions other than malignant neoplasm: Secondary | ICD-10-CM | POA: Insufficient documentation

## 2016-09-13 LAB — BASIC METABOLIC PANEL
ANION GAP: 11 (ref 5–15)
BUN: 51 mg/dL — ABNORMAL HIGH (ref 6–20)
CHLORIDE: 101 mmol/L (ref 101–111)
CO2: 27 mmol/L (ref 22–32)
CREATININE: 2.3 mg/dL — AB (ref 0.61–1.24)
Calcium: 7.8 mg/dL — ABNORMAL LOW (ref 8.9–10.3)
GFR calc non Af Amer: 34 mL/min — ABNORMAL LOW (ref >60)
GFR, EST AFRICAN AMERICAN: 39 mL/min — AB (ref >60)
Glucose, Bld: 141 mg/dL — ABNORMAL HIGH (ref 65–99)
POTASSIUM: 3.2 mmol/L — AB (ref 3.5–5.1)
SODIUM: 139 mmol/L (ref 135–145)

## 2016-09-13 NOTE — Patient Instructions (Signed)
Labs today We will only contact you if something comes back abnormal or we need to make some changes. Otherwise no news is good news!  Your physician recommends that you schedule a follow-up appointment in: 4 WEEKS WITH AMY CLEGG,NP-C  Do the following things EVERYDAY: 1) Weigh yourself in the morning before breakfast. Write it down and keep it in a log. 2) Take your medicines as prescribed 3) Eat low salt foods-Limit salt (sodium) to 2000 mg per day.  4) Stay as active as you can everyday 5) Limit all fluids for the day to less than 2 liters

## 2016-09-13 NOTE — Progress Notes (Signed)
ID: Dr Linus Salmons  Cardiology: Dr Gwenlyn Found HF MD: Dr Aundra Dubin Orthopedic: Dr  Percell Miller   HPI: [name] is a 41 year old AA with a history of DM, HTN, and chronic systolic heart failure (diagnosed in 2010 EF 30-35%).   Admitted 2/27 through 09/02/16 with marked volume overload. Diuresed with IV lasix and transitioned to torsemide 60 mg daily. Hospital course was complicated by septic left knee. Discharged on IV antibiotics.  Discharge weight was 305 pounds.   Today he returns for post hospital follow up. Overall feeling ok. Complaining of L knee pain. Appeite fair. Denies SOB/PND/Orthopnea. Walking with walker. Weight at home trending down from 301-279 pounds. Denies dizziness. No fever or chills. Taking all medications. [facility] following. Completing IV antibiotics. Lives at home with his wife and 2 children. Currently not working. Able to get medications.   ECHO 07/2016 EF 25-30%.  ECHO 08/2012 EF 35-40% ECHO 02/15/12 EF 25%   02/16/12 RHC/LHC  RA - 28  RV - 61/29  PA - 63/18 (31)  FCO/CI - 4.28/1.59  TDCO/CI - 3.46/1.28  AO Sat - 94% (room air)  PA Sat - 49% (room air)  LVEDP 28  PVR = 0.8 [place]  Moderate tubular mid-LAD lesion, otherwise no significant obstructive CAD.   ROS: All systems negative except as listed in HPI, PMH and Problem List.  Past Medical History:  Diagnosis Date  . 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 Diabetes mellitus    adult-onset, type 2  . Exogenous obesity   . GERD (gastroesophageal reflux disease)   . Hypertension     Current Outpatient Prescriptions  Medication Sig Dispense Refill  . acetaminophen (TYLENOL) 500 MG tablet Take 1,000 mg by mouth every 6 (six) hours as needed for moderate pain or headache.    Marland Kitchen aspirin 81 MG tablet Take 81 mg by mouth daily.    . carvedilol (COREG) 25 MG tablet Take 1 tablet (25 mg total) by mouth 2 (two) times daily with a meal. 60 tablet 0  . cefTRIAXone (ROCEPHIN) IVPB Inject 2 g into the  vein daily. Indication:  Septic arthritis Last Day of Therapy:  09/13/16 Labs - Once weekly:  CBC/D and BMP, Labs - Every other week:  ESR and CRP 12 Units 0  . ferrous sulfate 325 (65 FE) MG tablet Take 1 tablet (325 mg total) by mouth 2 (two) times daily with a meal. 60 tablet 0  . glucose blood (FREESTYLE TEST STRIPS) test strip Use as instructed 100 each 0  . glucose monitoring kit (FREESTYLE) monitoring kit 1 each by Does not apply route 4 (four) times daily - after meals and at bedtime. 1 month Diabetic Testing Supplies for QAC-QHS accuchecks. 1 each 1  . hydrALAZINE (APRESOLINE) 50 MG tablet Take 1.5 tablets (75 mg total) by mouth 3 (three) times daily. 180 tablet 0  . HYDROcodone-acetaminophen (NORCO) 10-325 MG tablet Take 1 tablet by mouth every 6 (six) hours as needed for severe pain. 20 tablet 0  . insulin aspart (NOVOLOG FLEXPEN) 100 UNIT/ML FlexPen 0-15 Units, Subcutaneous, 3 times daily with meals CBG < 70: implement hypoglycemia protocol-call MD CBG 70 - 120: 0 units CBG 121 - 150: 2 units CBG 151 - 200: 3 units CBG 201 - 250: 5 units CBG 251 - 300: 8 units CBG 301 - 350: 11 units CBG 351 - 400: 15 units CBG > 400:Call MD 15 mL 0  . Insulin Glargine (LANTUS SOLOSTAR) 100  UNIT/ML Solostar Pen Inject 15 Units into the skin every morning. And pen needles 1/day 5 pen 0  . Insulin Pen Needle 32G X 8 MM MISC Use as directed 100 each 0  . isosorbide mononitrate (IMDUR) 60 MG 24 hr tablet Take 1.5 tablets (90 mg total) by mouth daily. 120 tablet 0  . Lancets (FREESTYLE) lancets Use as instructed 100 each 0  . pantoprazole (PROTONIX) 40 MG tablet Take 1 tablet (40 mg total) by mouth daily. 30 tablet 0  . torsemide (DEMADEX) 20 MG tablet Take 3 tablets (60 mg total) by mouth daily. 90 tablet 0  . [START ON 09/14/2016] amoxicillin (AMOXIL) 500 MG capsule Take 1 capsule (500 mg total) by mouth 3 (three) times daily. (Patient not taking: Reported on 09/13/2016) 42 capsule 0   No current  facility-administered medications for this encounter.      PHYSICAL EXAM: Vitals:   09/13/16 1457  BP: 110/80  Pulse: 82  SpO2: 95%  Weight: 278 lb (126.1 kg)   Filed Weights   09/13/16 1457  Weight: 278 lb (126.1 kg)    General: Well appearing. NAD. Ambulated in the clinic with a walker.  HEENT: normal  Neck: supple. JVP 6-7 Carotids 2+ bilat; no bruits. No lymphadenopathy or thryomegaly appreciated.  Cor: Regular regular rate & rhythm. No rubs, gallops or murmurs. R upper chest tunneld PICC  Lungs: clear  Abdomen:obese, NT, ND. No hepatosplenomegaly. No bruits or masses. Good bowel sounds.  Extremities: no cyanosis, clubbing,  L>R LLE trace edema RLE no edema  Neuro: alert & orientedx3, moves all 4 extremities w/o difficulty. Affect pleasant   ASSESSMENT & PLAN:  1. Chronic Systolic Heart Failure NICM. ECHO 07/2016 EF 25-30%. Plan to repeat ECHO the end of June to see if EF improves back on HF medications.  Admitted the end of February with marked volume overload in the setting of medication compliance.  NYHA II. Volume status stable. Continue torsemide 60 mg daily.  On goal dose carvedilol 25 mg twice a day.  Continue hydralazine 75 mg three times a day + 90 mg imdur daily.  Do the following things EVERYDAY: 1) Weigh yourself in the morning before breakfast. Write it down and keep it in a log. 2) Take your medicines as prescribed 3) Eat low salt foods-Limit salt (sodium) to 2000 mg per day.  4) Stay as active as you can everyday 5) Limit all fluids for the day to less than 2 liters 2. L Knee Septic Arhtritis- S/P Lknee wash out with steptococcus. Completed IV antibiotics today. Followe by ID 3. HTN: Stable today.  4. CKD Stage III- Creatinine baseline 2.3-2.6 Check BMET now. Next visit may need to refer to Nephrology. He understands he needs to avoid NSAIDs.  5. Obesity - Discussed portion control. Weight trending down. Body mass index is 38.77 kg/m.    Follow up in  4 weeks.   Greater than 50% of the (total minute 25) visit spent in counseling/coordination of care regarding heart failure, medication compliance, and ongoing follow up.   Melbert Botelho NP-C  3:58 PM

## 2016-09-14 NOTE — Progress Notes (Deleted)
HPI:   [name] is a 41 y.o. male, who is here today to establish care with me.  Former PCP: *** Last preventive routine visit: ***  Chronic medical problems: DM II (follows with Joshua Massey), CHF Dx ion 2010, HTN,knee OA,CKD III-IV among some.  09/02/16 he was discharged ,admitted on 08/16/16 because septic arthritis left knee. He has been following with Joshua Massey,ID, last OV 09/12/16.   Concerns today: ***     Review of Systems    Current Outpatient Prescriptions on File Prior to Visit  Medication Sig Dispense Refill  . acetaminophen (TYLENOL) 500 MG tablet Take 1,000 mg by mouth every 6 (six) hours as needed for moderate pain or headache.    Marland Kitchen amoxicillin (AMOXIL) 500 MG capsule Take 1 capsule (500 mg total) by mouth 3 (three) times daily. (Patient not taking: Reported on 09/13/2016) 42 capsule 0  . aspirin 81 MG tablet Take 81 mg by mouth daily.    . carvedilol (COREG) 25 MG tablet Take 1 tablet (25 mg total) by mouth 2 (two) times daily with a meal. 60 tablet 0  . cefTRIAXone (ROCEPHIN) IVPB Inject 2 g into the vein daily. Indication:  Septic arthritis Last Day of Therapy:  09/13/16 Labs - Once weekly:  CBC/D and BMP, Labs - Every other week:  ESR and CRP 12 Units 0  . ferrous sulfate 325 (65 FE) MG tablet Take 1 tablet (325 mg total) by mouth 2 (two) times daily with a meal. 60 tablet 0  . glucose blood (FREESTYLE TEST STRIPS) test strip Use as instructed 100 each 0  . glucose monitoring kit (FREESTYLE) monitoring kit 1 each by Does not apply route 4 (four) times daily - after meals and at bedtime. 1 month Diabetic Testing Supplies for QAC-QHS accuchecks. 1 each 1  . hydrALAZINE (APRESOLINE) 50 MG tablet Take 1.5 tablets (75 mg total) by mouth 3 (three) times daily. 180 tablet 0  . HYDROcodone-acetaminophen (NORCO) 10-325 MG tablet Take 1 tablet by mouth every 6 (six) hours as needed for severe pain. 20 tablet 0  . insulin aspart (NOVOLOG FLEXPEN) 100 UNIT/ML  FlexPen 0-15 Units, Subcutaneous, 3 times daily with meals CBG < 70: implement hypoglycemia protocol-call MD CBG 70 - 120: 0 units CBG 121 - 150: 2 units CBG 151 - 200: 3 units CBG 201 - 250: 5 units CBG 251 - 300: 8 units CBG 301 - 350: 11 units CBG 351 - 400: 15 units CBG > 400:Call MD 15 mL 0  . Insulin Glargine (LANTUS SOLOSTAR) 100 UNIT/ML Solostar Pen Inject 15 Units into the skin every morning. And pen needles 1/day 5 pen 0  . Insulin Pen Needle 32G X 8 MM MISC Use as directed 100 each 0  . isosorbide mononitrate (IMDUR) 60 MG 24 hr tablet Take 1.5 tablets (90 mg total) by mouth daily. 120 tablet 0  . Lancets (FREESTYLE) lancets Use as instructed 100 each 0  . pantoprazole (PROTONIX) 40 MG tablet Take 1 tablet (40 mg total) by mouth daily. 30 tablet 0  . torsemide (DEMADEX) 20 MG tablet Take 3 tablets (60 mg total) by mouth daily. 90 tablet 0   No current facility-administered medications on file prior to visit.      Past Medical History:  Diagnosis Date  . 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 Diabetes mellitus    adult-onset, type 2  . Exogenous obesity   . GERD (gastroesophageal  reflux disease)   . Hypertension    No Known Allergies  Family History  Problem Relation Age of Onset  . Coronary artery disease Brother     CABG at 20, DM  . Diabetes Mother   . Hypertension Mother   . Diabetes Father     Social History   Social History  . Marital status: Single    Spouse name: N/A  . Number of children: 6  . Years of education: 5   Occupational History  . meter checker     Joshua Massey  . 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 Smoking status: Never Smoker  . Smokeless tobacco: Never Used  . Alcohol use No  . Drug use: No  . Sexual activity: Yes    Birth control/ protection: Condom   Other Topics Concern  . Not on file   Social History Narrative  . No narrative on file    There  were no vitals filed for this visit.  There is no height or weight on file to calculate BMI.      Physical Exam    ASSESSMENT AND PLAN:     There are no diagnoses linked to this encounter.              Joshua Massey G. [name], MD  [facility]. [place] office.

## 2016-09-15 ENCOUNTER — Telehealth (HOSPITAL_COMMUNITY): Payer: Self-pay | Admitting: Cardiology

## 2016-09-15 ENCOUNTER — Ambulatory Visit: Payer: Self-pay | Admitting: Family Medicine

## 2016-09-15 MED ORDER — POTASSIUM CHLORIDE CRYS ER 20 MEQ PO TBCR: 40.0000 meq | EXTENDED_RELEASE_TABLET | Freq: Every day | ORAL | 3 refills | Status: DC

## 2016-09-15 NOTE — Telephone Encounter (Signed)
-----   Message from Conrad [employer], NP sent at 09/13/2016  4:54 PM EDT ----- Start 40 meq potassium daily.

## 2016-09-15 NOTE — Telephone Encounter (Signed)
Patient aware. Patient voiced understanding  *[phone]

## 2016-09-16 ENCOUNTER — Ambulatory Visit (HOSPITAL_COMMUNITY)
Admission: RE | Admit: 2016-09-16 | Discharge: 2016-09-16 | Disposition: A | Discharge status: Home / Self Care | Payer: BLUE CROSS/BLUE SHIELD | Source: Ambulatory Visit | Attending: Internal Medicine | Admitting: Internal Medicine

## 2016-09-16 ENCOUNTER — Encounter (HOSPITAL_COMMUNITY): Payer: Self-pay | Admitting: Interventional Radiology

## 2016-09-16 DIAGNOSIS — Z452 Encounter for adjustment and management of vascular access device: Secondary | ICD-10-CM | POA: Insufficient documentation

## 2016-09-16 DIAGNOSIS — M0080 Arthritis due to other bacteria, unspecified joint: Secondary | ICD-10-CM

## 2016-09-16 DIAGNOSIS — M01X0 Direct infection of unspecified joint in infectious and parasitic diseases classified elsewhere: Secondary | ICD-10-CM

## 2016-09-16 DIAGNOSIS — A498 Other bacterial infections of unspecified site: Secondary | ICD-10-CM

## 2016-09-16 HISTORY — PX: IR GENERIC HISTORICAL: IMG1180011

## 2016-09-16 NOTE — Addendum Note (Signed)
Encounter addended by: Conrad [place], NP on: 09/16/2016  7:54 AM<BR>    Actions taken: LOS modified

## 2016-09-19 ENCOUNTER — Encounter: Payer: Self-pay | Admitting: Family Medicine

## 2016-09-26 ENCOUNTER — Other Ambulatory Visit: Payer: Self-pay | Admitting: Family Medicine

## 2016-09-26 ENCOUNTER — Encounter: Payer: Self-pay | Admitting: Family Medicine

## 2016-09-26 ENCOUNTER — Ambulatory Visit (INDEPENDENT_AMBULATORY_CARE_PROVIDER_SITE_OTHER): Payer: BLUE CROSS/BLUE SHIELD | Admitting: Family Medicine

## 2016-09-26 VITALS — BP 138/90 | HR 94 | Resp 12 | Ht 71.0 in | Wt 268.2 lb

## 2016-09-26 DIAGNOSIS — I10 Essential (primary) hypertension: Secondary | ICD-10-CM

## 2016-09-26 DIAGNOSIS — E876 Hypokalemia: Secondary | ICD-10-CM | POA: Diagnosis not present

## 2016-09-26 DIAGNOSIS — R238 Other skin changes: Secondary | ICD-10-CM

## 2016-09-26 DIAGNOSIS — N183 Chronic kidney disease, stage 3 unspecified: Secondary | ICD-10-CM

## 2016-09-26 DIAGNOSIS — E1159 Type 2 diabetes mellitus with other circulatory complications: Secondary | ICD-10-CM

## 2016-09-26 DIAGNOSIS — M25562 Pain in left knee: Secondary | ICD-10-CM | POA: Diagnosis not present

## 2016-09-26 MED ORDER — DOXYCYCLINE HYCLATE 100 MG PO TABS: 100.0000 mg | ORAL_TABLET | Freq: Two times a day (BID) | ORAL | 0 refills | Status: AC

## 2016-09-26 MED ORDER — FAMCICLOVIR 500 MG PO TABS: 500.0000 mg | ORAL_TABLET | Freq: Three times a day (TID) | ORAL | 0 refills | Status: AC

## 2016-09-26 MED ORDER — DICLOFENAC SODIUM 1 % TD GEL: 4.0000 g | Freq: Four times a day (QID) | TRANSDERMAL | 1 refills | Status: DC

## 2016-09-26 NOTE — Patient Instructions (Signed)
A few things to remember from today's visit:   Rash, vesicular - Plan: Virus culture, famciclovir (FAMVIR) 500 MG tablet, doxycycline (VIBRA-TABS) 100 MG tablet  Type 2 diabetes mellitus with vascular disease ([place])  Essential hypertension  Left knee pain, unspecified chronicity - Plan: diclofenac sodium (VOLTAREN) 1 % GEL  Voltaren gel for knee pain.Continue following with orthopedists for pain management.  Rash ? Shingles.  Stop Amoxicillin.  Monitor blood pressure at home.   Please be sure medication list is accurate. If a new problem present, please set up appointment sooner than planned today.

## 2016-09-26 NOTE — Progress Notes (Signed)
HPI:   [name] is a 41 y.o. male, who is here today to establish care with me.  Former PCP: N/A Last preventive routine visit: He is not sure but has not had one in 1-2 years.   Chronic medical problems: DM II , CHF Dx on 2010, HTN,knee OA,CKD III-IV among some.  09/02/16 he was discharged from hospital ,admitted on 08/16/16 because septic arthritis left knee, AKF, and CHF exacerbation. He has been following with Dr Comer,ID, last OV 09/12/16.  CHF: He denies orthopnea or PND. He already follow with Dr Gorden Harms on 09/13/16 and has an appt in late this month. He is on Torsemide 60 mg 3 tabs daily. HypoK+, he is on KCL 20 meq 2 tabs daily,started on 09/14/16.  Echo 08/17/16 LVEF 25-30%  Hypertension:   Currently on Hydralazine 50 mg 1.5 tab tid,Coreg 25 mg bid,Imdur 60 mg daily. He has Hx of non compliance but ensures me he is taking medications as instructed, no side effects reported.  He has not noted unusual headache, visual changes, exertional chest pain, dyspnea,  focal weakness, or worsening LE edema.   Lab Results  Component Value Date   CREATININE 2.30 (H) 09/13/2016   BUN 51 (H) 09/13/2016   NA 139 09/13/2016   K 3.2 (L) 09/13/2016   CL 101 09/13/2016   CO2 27 09/13/2016     Concerns today: Rash, which he noted about 2-3 days ago.  He denies any new detergent, soap, or body product.He is on Amoxicillin treatment, he thinks this may be causing rash. Mild pruritus, tender rash,mainly on right axilla and a few lesions on RUE.  No known insect bite or outdoor exposures to plants. No sick contact. No Hx of eczema or similar rash in the past.  OTC medication for this problem: "Cream" ,not sure about name, has helped some. He denies associated oral lesions/edema,cough, wheezing, cough,or myalgia.No numbness or tingling.   DM II: He is planning on arranging appt with endocrinologists, last visit with [name] was 12/2015. He does not exercise  regularly but he tells me that he is following a healthy diet.  Lab Results  Component Value Date   HGBA1C 11.2 (H) 08/17/2016    BS's FG 120-140, last night 199.  He is on Lantus 15 U daily and Novolog sliding scale. He denies hypoglycemia.  -He is also requesting refill on Hydrocodone-Acetaminophen  10-325 mg, which was prescribed by his orthopedic for right knee pain. According to patient, he was instructed to ask his PCP to continue filling prescription. He is following with orthopedist, next appointment in 2 weeks.  He denies fever,chills, worsening fatigue, or changes in appetite.    Review of Systems  Constitutional: Negative for activity change, appetite change, fatigue and fever.  HENT: Negative for mouth sores, nosebleeds, sore throat and trouble swallowing.   Eyes: Negative for redness and visual disturbance.  Respiratory: Negative for cough, shortness of breath and wheezing.   Cardiovascular: Positive for leg swelling. Negative for chest pain and palpitations.  Gastrointestinal: Negative for abdominal pain, nausea and vomiting.       No changes in bowel habits.  Endocrine: Negative for polydipsia, polyphagia and polyuria.  Genitourinary: Negative for decreased urine volume, dysuria and hematuria.  Musculoskeletal: Positive for arthralgias, gait problem and joint swelling. Negative for myalgias.  Skin: Positive for rash. Negative for wound.  Neurological: Negative for syncope, weakness and headaches.  Hematological: Negative for adenopathy. Does not bruise/bleed easily.  Psychiatric/Behavioral:  Negative for confusion.      Current Outpatient Prescriptions on File Prior to Visit  Medication Sig Dispense Refill  . acetaminophen (TYLENOL) 500 MG tablet Take 1,000 mg by mouth every 6 (six) hours as needed for moderate pain or headache.    Marland Kitchen aspirin 81 MG tablet Take 81 mg by mouth daily.    . carvedilol (COREG) 25 MG tablet Take 1 tablet (25 mg total) by mouth 2 (two)  times daily with a meal. 60 tablet 0  . ferrous sulfate 325 (65 FE) MG tablet Take 1 tablet (325 mg total) by mouth 2 (two) times daily with a meal. 60 tablet 0  . glucose blood (FREESTYLE TEST STRIPS) test strip Use as instructed 100 each 0  . glucose monitoring kit (FREESTYLE) monitoring kit 1 each by Does not apply route 4 (four) times daily - after meals and at bedtime. 1 month Diabetic Testing Supplies for QAC-QHS accuchecks. 1 each 1  . hydrALAZINE (APRESOLINE) 50 MG tablet Take 1.5 tablets (75 mg total) by mouth 3 (three) times daily. 180 tablet 0  . HYDROcodone-acetaminophen (NORCO) 10-325 MG tablet Take 1 tablet by mouth every 6 (six) hours as needed for severe pain. 20 tablet 0  . insulin aspart (NOVOLOG FLEXPEN) 100 UNIT/ML FlexPen 0-15 Units, Subcutaneous, 3 times daily with meals CBG < 70: implement hypoglycemia protocol-call MD CBG 70 - 120: 0 units CBG 121 - 150: 2 units CBG 151 - 200: 3 units CBG 201 - 250: 5 units CBG 251 - 300: 8 units CBG 301 - 350: 11 units CBG 351 - 400: 15 units CBG > 400:Call MD 15 mL 0  . Insulin Glargine (LANTUS SOLOSTAR) 100 UNIT/ML Solostar Pen Inject 15 Units into the skin every morning. And pen needles 1/day 5 pen 0  . Insulin Pen Needle 32G X 8 MM MISC Use as directed 100 each 0  . isosorbide mononitrate (IMDUR) 60 MG 24 hr tablet Take 1.5 tablets (90 mg total) by mouth daily. 120 tablet 0  . Lancets (FREESTYLE) lancets Use as instructed 100 each 0  . pantoprazole (PROTONIX) 40 MG tablet Take 1 tablet (40 mg total) by mouth daily. 30 tablet 0  . potassium chloride SA (K-DUR,KLOR-CON) 20 MEQ tablet Take 2 tablets (40 mEq total) by mouth daily. 60 tablet 3  . torsemide (DEMADEX) 20 MG tablet Take 3 tablets (60 mg total) by mouth daily. 90 tablet 0   No current facility-administered medications on file prior to visit.      Past Medical History:  Diagnosis Date  . 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 Diabetes  mellitus    adult-onset, type 2  . Exogenous obesity   . GERD (gastroesophageal reflux disease)   . Hypertension    No Known Allergies  Family History  Problem Relation Age of Onset  . Coronary artery disease Brother     CABG at 19, DM  . Heart disease Brother 74    CAD  . Diabetes Mother   . Hypertension Mother   . Hyperlipidemia Mother   . Heart disease Mother   . Diabetes Father   . Hypertension Father     Social History   Social History  . Marital status: Single    Spouse name: N/A  . Number of children: 6  . Years of education: 20   Occupational History  . 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 [employer] delivery [employer]   Social History Main Topics  . Smoking status: Never Smoker  . Smokeless tobacco: Never Used  . Alcohol use No  . Drug use: No  . Sexual activity: Yes    Birth control/ protection: Condom   Other Topics Concern  . None   Social History Narrative  . None    Vitals:   09/26/16 1353  BP: 138/90  Pulse: 94  Resp: 12  O2 sat at RA 93%.  Body mass index is 37.4 kg/m.  Physical Exam  Nursing note and vitals reviewed. Constitutional: He is oriented to person, place, and time. He appears well-developed. No distress.  HENT:  Head: Atraumatic.  Mouth/Throat: Oropharynx is clear and moist and mucous membranes are normal.  Eyes: Conjunctivae and EOM are normal. Pupils are equal, round, and reactive to light.  Cardiovascular: Normal rate and regular rhythm.   No murmur heard. Pulses:      Dorsalis pedis pulses are 2+ on the right side, and 2+ on the left side.  Respiratory: Effort normal and breath sounds normal. No respiratory distress.  GI: Soft. He exhibits no mass. There is no hepatomegaly. There is no tenderness.  Musculoskeletal: He exhibits edema (RLE 1+ pitting edema, LLE trace pitting edema. No calves tenderness or erythema.).  Right knee with edema,no erythema or local heat. Limitation of ROM.  Lymphadenopathy:     He has no cervical adenopathy.  Lymph nodes palpable in right axilla, no tender.  Neurological: He is alert and oriented to person, place, and time. He has normal strength.  Stable gait assisted with a cane.  Skin: Skin is warm. Rash noted. Rash is vesicular. There is erythema.     Confluent,erythematous, vesicular lesions localized on upper right chest,axilla,upper right back and a few scattered on RUE,palms,and fingers. Lesions are tender.  Psychiatric: He has a normal mood and affect.  Poorly groomed, good eye contact.      ASSESSMENT AND PLAN:   Edmon was seen today for establish care.  Diagnoses and all orders for this visit:  Type 2 diabetes mellitus with vascular disease ([place])  Reporting adequate BS's. No changes in current management. Educated about the importance of compliance with medications. He is due for eye exam. He will continue following with [name].  Rash, vesicular  Distribution (C7-C8-T1-T2)and characteristics of rash suggest zoster infection. I do not think it is related to Amoxicillin. Antiviral treatment recommended. I collected sample for viral Cx (later I was told sample was collected with wrong swab, lab ensures me he will not be charged if lab cannot be done.  Still instructed to stop Amoxicillin. I will cover with abx ,Doxycycline for 7 days. Clearly instructed about warning signs.  -     Virus culture; Future -     famciclovir (FAMVIR) 500 MG tablet; Take 1 tablet (500 mg total) by mouth 3 (three) times daily. -     doxycycline (VIBRA-TABS) 100 MG tablet; Take 1 tablet (100 mg total) by mouth 2 (two) times daily. -     Virus culture  Essential hypertension  Mildly elevated. No changes in current management. Monitor BP at home. Low salt diet recommended. He is due for eye exam. Keep appt with cardiologists. I will see him back in 2 months.  -     Basic metabolic panel; Future  Left knee pain, unspecified  chronicity  Explained that Hydrocodone is a controlled medication, so it need to be continue by provider who started medication. He has an appt  with ortho for f/u in 2 weeks (per pt report) and has been following periodically,so I think it is appropriate to call provider and ask for refill if he is due for Rx. He can also try Voltaren gel.  -     diclofenac sodium (VOLTAREN) 1 % GEL; Apply 4 g topically 4 (four) times daily.  Hypokalemia  No changes in KCL dose. BMP in a week.   -     Basic metabolic panel; Future  CKD (chronic kidney disease), stage III  BMP will be re-check in a week, will consider nephrology evaluation if Cr still > 2.0 or e GFR < 30. He is on a high dose diuretic and not on ACEI or ARB. Low salt diet recommended and avoidance of oral NSAID's.  -     Basic metabolic panel; Future -     Microalbumin / creatinine urine ratio; Future     [name]. [name], MD  [facility]. [place] office.

## 2016-09-26 NOTE — Progress Notes (Signed)
Pre visit review using our clinic review tool, if applicable. No additional management support is needed unless otherwise documented below in the visit note. 

## 2016-09-27 ENCOUNTER — Telehealth: Payer: Self-pay

## 2016-09-27 NOTE — Telephone Encounter (Signed)
-----   Message from [name] [name], MD sent at 09/26/2016 10:19 PM EDT ----- Regarding: Labs I forgot to tell him to come back for labs next week to re-check K+. He was recently started on KCL. He has an appt with cardiologists for follow up but I think we should re-check it sooner. Lab order was placed. Thanks, BJ

## 2016-09-27 NOTE — Telephone Encounter (Signed)
Patient is scheduled for labs 

## 2016-09-28 ENCOUNTER — Telehealth: Payer: Self-pay | Admitting: Family Medicine

## 2016-09-28 ENCOUNTER — Other Ambulatory Visit: Payer: Self-pay | Admitting: Family Medicine

## 2016-09-28 MED ORDER — LIDOCAINE 3.75 % EX CREA: 1.0000 "application " | TOPICAL_CREAM | Freq: Three times a day (TID) | CUTANEOUS | 1 refills | Status: DC | PRN

## 2016-09-28 NOTE — Telephone Encounter (Signed)
Pt was seen on 4-9 dx with shingles. Pt is in pain and would like med call into [facility] [address]

## 2016-09-28 NOTE — Telephone Encounter (Signed)
LMTCB

## 2016-09-28 NOTE — Telephone Encounter (Signed)
Topical Lidocaine to apply on area of rash is usually recommended for this type of pain. Also Gabapentin is something to consider if he has some residual pain.  I am sending Rx to his pharmacy.  Thanks, BJ

## 2016-09-30 ENCOUNTER — Other Ambulatory Visit: Payer: BLUE CROSS/BLUE SHIELD

## 2016-10-03 ENCOUNTER — Telehealth (HOSPITAL_COMMUNITY): Payer: Self-pay | Admitting: Vascular Surgery

## 2016-10-03 NOTE — Telephone Encounter (Signed)
Refill Torsemide to [facility] [address] .Marland Kitchen Please advise

## 2016-10-04 ENCOUNTER — Other Ambulatory Visit (HOSPITAL_COMMUNITY): Payer: Self-pay | Admitting: *Deleted

## 2016-10-04 ENCOUNTER — Other Ambulatory Visit: Payer: BLUE CROSS/BLUE SHIELD

## 2016-10-04 MED ORDER — TORSEMIDE 20 MG PO TABS: 60.0000 mg | ORAL_TABLET | Freq: Every day | ORAL | 6 refills | Status: DC

## 2016-10-05 ENCOUNTER — Telehealth: Payer: Self-pay | Admitting: Family Medicine

## 2016-10-05 ENCOUNTER — Other Ambulatory Visit (HOSPITAL_COMMUNITY): Payer: Self-pay

## 2016-10-05 MED ORDER — TORSEMIDE 20 MG PO TABS: 60.0000 mg | ORAL_TABLET | Freq: Every day | ORAL | 6 refills | Status: DC

## 2016-10-05 NOTE — Telephone Encounter (Signed)
Pt said he had shingle test and would like the result on 09-26-16

## 2016-10-05 NOTE — Telephone Encounter (Signed)
Called and spoke with patient. Let him know that the test was unable to be ran, but he was already put on antibiotic treatment due to clinical findings. Patient verbalized understanding.  Patient said that he would like to try the Gabapentin that was mentioned that he could take for pain.  What strength would you like to send in?

## 2016-10-06 ENCOUNTER — Other Ambulatory Visit (HOSPITAL_COMMUNITY): Payer: Self-pay

## 2016-10-06 ENCOUNTER — Other Ambulatory Visit: Payer: Self-pay | Admitting: Family Medicine

## 2016-10-06 MED ORDER — GABAPENTIN 300 MG PO CAPS: 300.0000 mg | ORAL_CAPSULE | Freq: Every day | ORAL | 1 refills | Status: DC

## 2016-10-06 NOTE — Progress Notes (Signed)
Paramedicine Encounter    Patient ID: Joshua Massey, male    DOB: 10-Jun-1976, 41 y.o.   MRN: [id number]    Patient Care Team: [name] [name], MD as PCP - General (Family Medicine)  Patient Active Problem List   Diagnosis Date Noted  . Status post peripherally inserted central catheter (PICC) central line placement 09/12/2016  . Medication monitoring encounter 09/12/2016  . Arthritis, septic ([place])   . CKD (chronic kidney disease), stage III   . AKI (acute kidney injury) ([place])   . Peripheral edema   . Acute renal failure ([place]) 08/17/2016  . Acute systolic CHF (congestive heart failure) ([place]) 08/17/2016  . Type 2 diabetes mellitus with vascular disease ([place]) 10/21/2013  . Chronic systolic heart failure ([place]) 02/27/2012  . Coronary artery disease, 50% LAD at cath 02/16/12 02/23/2012  . Family history of coronary artery disease 02/23/2012  . Cardiogenic shock ([place]) 02/17/2012  . NSVT (nonsustained ventricular tachycardia) ([place]) 02/16/2012  . Obesity 02/16/2012  . Acute on chronic systolic CHF (congestive heart failure) ([place]) 02/16/2012  . CHF (congestive heart failure) ([place]) 02/15/2012  . Hypertension 02/15/2012  . Non-ischemic cardiomyopathy, EF < 20% 02/15/2012  . TINEA CRURIS 09/25/2009  . ALLERGIC RHINITIS CAUSE UNSPECIFIED 09/25/2009  . SCROTAL ABSCESS 09/17/2009  . Essential hypertension 01/26/2009    Current Outpatient Prescriptions:  .  acetaminophen (TYLENOL) 500 MG tablet, Take 1,000 mg by mouth every 6 (six) hours as needed for moderate pain or headache., Disp: , Rfl:  .  aspirin 81 MG tablet, Take 81 mg by mouth daily., Disp: , Rfl:  .  carvedilol (COREG) 25 MG tablet, Take 1 tablet (25 mg total) by mouth 2 (two) times daily with a meal., Disp: 60 tablet, Rfl: 0 .  diclofenac sodium (VOLTAREN) 1 % GEL, Apply 4 g topically 4 (four) times daily., Disp: 3 Tube, Rfl: 1 .  ferrous sulfate 325 (65 FE) MG tablet, Take 1 tablet (325 mg total) by mouth 2 (two) times daily with  a meal., Disp: 60 tablet, Rfl: 0 .  gabapentin (NEURONTIN) 300 MG capsule, Take 1 capsule (300 mg total) by mouth at bedtime., Disp: 90 capsule, Rfl: 1 .  glucose blood (FREESTYLE TEST STRIPS) test strip, Use as instructed, Disp: 100 each, Rfl: 0 .  glucose monitoring kit (FREESTYLE) monitoring kit, 1 each by Does not apply route 4 (four) times daily - after meals and at bedtime. 1 month Diabetic Testing Supplies for QAC-QHS accuchecks., Disp: 1 each, Rfl: 1 .  hydrALAZINE (APRESOLINE) 50 MG tablet, Take 1.5 tablets (75 mg total) by mouth 3 (three) times daily., Disp: 180 tablet, Rfl: 0 .  HYDROcodone-acetaminophen (NORCO) 10-325 MG tablet, Take 1 tablet by mouth every 6 (six) hours as needed for severe pain., Disp: 20 tablet, Rfl: 0 .  insulin aspart (NOVOLOG FLEXPEN) 100 UNIT/ML FlexPen, 0-15 Units, Subcutaneous, 3 times daily with meals CBG < 70: implement hypoglycemia protocol-call MD CBG 70 - 120: 0 units CBG 121 - 150: 2 units CBG 151 - 200: 3 units CBG 201 - 250: 5 units CBG 251 - 300: 8 units CBG 301 - 350: 11 units CBG 351 - 400: 15 units CBG > 400:Call MD, Disp: 15 mL, Rfl: 0 .  Insulin Glargine (LANTUS SOLOSTAR) 100 UNIT/ML Solostar Pen, Inject 15 Units into the skin every morning. And pen needles 1/day, Disp: 5 pen, Rfl: 0 .  Insulin Pen Needle 32G X 8 MM MISC, Use as directed, Disp: 100 each, Rfl: 0 .  isosorbide mononitrate (IMDUR) 60 MG 24 hr tablet, Take 1.5 tablets (90 mg total) by mouth daily., Disp: 120 tablet, Rfl: 0 .  Lancets (FREESTYLE) lancets, Use as instructed, Disp: 100 each, Rfl: 0 .  Lidocaine 3.75 % CREA, Apply 1 application topically 3 (three) times daily as needed., Disp: 60 g, Rfl: 1 .  pantoprazole (PROTONIX) 40 MG tablet, Take 1 tablet (40 mg total) by mouth daily., Disp: 30 tablet, Rfl: 0 .  potassium chloride SA (K-DUR,KLOR-CON) 20 MEQ tablet, Take 2 tablets (40 mEq total) by mouth daily., Disp: 60 tablet, Rfl: 3 .  torsemide (DEMADEX) 20 MG tablet, Take 3 tablets  (60 mg total) by mouth daily., Disp: 90 tablet, Rfl: 6 No Known Allergies   Social History   Social History  . Marital status: Single    Spouse name: N/A  . Number of children: 6  . Years of education: 74   Occupational History  . 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 Smoking status: Never Smoker  . Smokeless tobacco: Never Used  . Alcohol use No  . Drug use: No  . Sexual activity: Yes    Birth control/ protection: Condom   Other Topics Concern  . Not on file   Social History Narrative  . No narrative on file    Physical Exam  Pulmonary/Chest: No respiratory distress. He has no rales.  Abdominal: Soft. He exhibits no distension. There is no guarding.  Musculoskeletal: He exhibits edema.  Skin: Skin is warm and dry. Rash noted. He is not diaphoretic.        SAFE - 10/06/16 1500      Situation   Heart failure history Exisiting   Comorbidities DM;HTN   Readmitted within 30 days No   Hospital admission within past 12 months Yes   number of hospital admissions 1   number of ED visits --  1   Target Weight 220 lbs     Assessment   Lives alone --  wife, 2 kids    Primary support person delissa [place] [phone]   Mode of transportation personal car   Other services involved None   Home equipement Cane;Scale;Walker     Weight   Weighs self daily Yes   Scale provided Yes   Records on weight chart No     Resources   Has "Living better w/heart failure" book Yes   Has HF Zone tool Yes   Able to identify yellow zone signs/when to call MD Yes   Records zone daily Yes     Medications   Uses a pill box Yes   Who stocks the pill box --  self   Pill box checked this visit Yes   Pill box refilled this visit Yes   Mail order medications No   Missed one or more doses of medications per week No        Future Appointments Date Time Provider [place]  10/11/2016 3:00 PM  [facility] PA/NP [facility] None  11/28/2016 11:30 AM [name] [name], MD [facility]    ATF pt CAO x4 sitting at his parents home c/o knee pain and shingles. Pt stated that he was diagnosed with shingles today and given medications.  Pt fills his pill box on his own but didn't bring his medications to this visit (just the pill box). Pt stated that he has not missed any medications  besides lasix due to being out of them for the past few days. Pt stated that he is pretty good with keeping up with taking his medications and keeping up with low sodium diet. Pt stated that he has a good support system. He lives with his wife, son and stepdaughter.  He drives his son and daughter to school daily and visits his parents home which is close by.  Pt has Blue cross/blue shield thru [facility] and pay a co-pay for his meds.  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  Pt has had knee pain since surgery (knee surgery during hospital stay), he stated the doctor said that he may need knee injections.  Pt agrees to [facility] visits.  We went over low sodium diets and discussed fluid intake.  Our next visit will be at the clinic on tues.   *heart clinic made aware of pt's b/p and heather advised that due to pt's up coming visit Tuesday, no medication adjustments will be made  *Left leg swollen possibliy due to knee/No edema to right leg   Check on ferrous refill primary physician BP (!) 150/110 (BP Location: Left Arm, Patient Position: Sitting, Cuff Size: Normal)   Pulse (!) 103   Resp 16   Wt 275 lb (124.7 kg)   SpO2 98%   BMI 38.35 kg/m   Weight yesterday-275 268 during last doctors visit    Leotis Isham, EMT Paramedic 10/07/2016    ACTION: Home visit completed

## 2016-10-06 NOTE — Telephone Encounter (Signed)
Rx for Gabapentin 300 mg to start at bedtime daily was sent to his pharmacy. I think he is supposed to follow in 2-3 months, we keep this appt for now.May need to be seen sooner depending of symptoms and medication tolerance.  Thanks, BJ

## 2016-10-06 NOTE — Telephone Encounter (Signed)
Patient has appointment in June

## 2016-10-11 ENCOUNTER — Ambulatory Visit (HOSPITAL_COMMUNITY)
Admission: RE | Admit: 2016-10-11 | Discharge: 2016-10-11 | Disposition: A | Discharge status: Home / Self Care | Payer: BLUE CROSS/BLUE SHIELD | Source: Ambulatory Visit | Attending: Cardiology | Admitting: Cardiology

## 2016-10-11 ENCOUNTER — Other Ambulatory Visit (HOSPITAL_COMMUNITY): Payer: Self-pay

## 2016-10-11 VITALS — BP 146/102 | HR 87 | Wt 270.6 lb

## 2016-10-11 DIAGNOSIS — E669 Obesity, unspecified: Secondary | ICD-10-CM | POA: Insufficient documentation

## 2016-10-11 DIAGNOSIS — I5022 Chronic systolic (congestive) heart failure: Secondary | ICD-10-CM | POA: Diagnosis not present

## 2016-10-11 DIAGNOSIS — E1122 Type 2 diabetes mellitus with diabetic chronic kidney disease: Secondary | ICD-10-CM | POA: Insufficient documentation

## 2016-10-11 DIAGNOSIS — Z79899 Other long term (current) drug therapy: Secondary | ICD-10-CM | POA: Insufficient documentation

## 2016-10-11 DIAGNOSIS — N183 Chronic kidney disease, stage 3 unspecified: Secondary | ICD-10-CM

## 2016-10-11 DIAGNOSIS — E6609 Other obesity due to excess calories: Secondary | ICD-10-CM | POA: Diagnosis not present

## 2016-10-11 DIAGNOSIS — B029 Zoster without complications: Secondary | ICD-10-CM | POA: Diagnosis not present

## 2016-10-11 DIAGNOSIS — I130 Hypertensive heart and chronic kidney disease with heart failure and stage 1 through stage 4 chronic kidney disease, or unspecified chronic kidney disease: Secondary | ICD-10-CM | POA: Insufficient documentation

## 2016-10-11 DIAGNOSIS — E66812 Obesity, class 2: Secondary | ICD-10-CM

## 2016-10-11 DIAGNOSIS — I509 Heart failure, unspecified: Secondary | ICD-10-CM

## 2016-10-11 DIAGNOSIS — I10 Essential (primary) hypertension: Secondary | ICD-10-CM

## 2016-10-11 DIAGNOSIS — Z6837 Body mass index (BMI) 37.0-37.9, adult: Secondary | ICD-10-CM | POA: Insufficient documentation

## 2016-10-11 DIAGNOSIS — Z7982 Long term (current) use of aspirin: Secondary | ICD-10-CM | POA: Insufficient documentation

## 2016-10-11 DIAGNOSIS — Z5189 Encounter for other specified aftercare: Secondary | ICD-10-CM | POA: Insufficient documentation

## 2016-10-11 DIAGNOSIS — Z794 Long term (current) use of insulin: Secondary | ICD-10-CM | POA: Insufficient documentation

## 2016-10-11 LAB — BASIC METABOLIC PANEL
ANION GAP: 6 (ref 5–15)
BUN: 50 mg/dL — ABNORMAL HIGH (ref 6–20)
CALCIUM: 8.2 mg/dL — AB (ref 8.9–10.3)
CO2: 26 mmol/L (ref 22–32)
Chloride: 105 mmol/L (ref 101–111)
Creatinine, Ser: 2.03 mg/dL — ABNORMAL HIGH (ref 0.61–1.24)
GFR calc non Af Amer: 40 mL/min — ABNORMAL LOW (ref >60)
GFR, EST AFRICAN AMERICAN: 46 mL/min — AB (ref >60)
GLUCOSE: 140 mg/dL — AB (ref 65–99)
Potassium: 4.1 mmol/L (ref 3.5–5.1)
Sodium: 137 mmol/L (ref 135–145)

## 2016-10-11 MED ORDER — HYDRALAZINE HCL 100 MG PO TABS: 100.0000 mg | ORAL_TABLET | Freq: Three times a day (TID) | ORAL | 3 refills | Status: DC

## 2016-10-11 NOTE — Patient Instructions (Signed)
INCREASE Hydralazine to 100 mg, one tab three times per day  Labs today We will only contact you if something comes back abnormal or we need to make some changes. Otherwise no news is good news!  Your physician has requested that you have an echocardiogram. Echocardiography is a painless test that uses sound waves to create images of your heart. It provides your doctor with information about the size and shape of your heart and how well your heart's chambers and valves are working. This procedure takes approximately one hour. There are no restrictions for this procedure.  Your physician recommends that you schedule a follow-up appointment in: 6-8 weeks with Dr Aundra Dubin  Do the following things EVERYDAY: 1) Weigh yourself in the morning before breakfast. Write it down and keep it in a log. 2) Take your medicines as prescribed 3) Eat low salt foods-Limit salt (sodium) to 2000 mg per day.  4) Stay as active as you can everyday 5) Limit all fluids for the day to less than 2 liters

## 2016-10-11 NOTE — Progress Notes (Signed)
  ID: Dr Comer  Cardiology: Dr Berry HF MD: Dr McLean Orthopedic: Dr  Murphy   HPI: [name] is a 41 year old AA with a history of DM, HTN, and chronic systolic heart failure (diagnosed in 2010 EF 30-35%).   Admitted 2/27 through 09/02/16 with marked volume overload. Diuresed with IV lasix and transitioned to torsemide 60 mg daily. Hospital course was complicated by septic left knee. Discharged on IV antibiotics.  Discharge weight was 305 pounds.   He returns today for regular follow up.  Feeling better overall, although treated for Shingles last week. Has 40 year old girl and 40 year old boy. Using a cane to get around now.  Legs still getting weak but denies SOB. Taking 60 mg torsemide daily. Has not needed any extra. Weight at home 269 this am at home. Weight down 10 lbs since last visit. 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 Denies lightheadedness or dizziness.   ECHO 07/2016 EF 25-30%.  ECHO 08/2012 EF 35-40% ECHO 02/15/12 EF 25%   02/16/12 RHC/LHC  RA - 28  RV - 61/29  PA - 63/18 (31)  FCO/CI - 4.28/1.59  TDCO/CI - 3.46/1.28  AO Sat - 94% (room air)  PA Sat - 49% (room air)  LVEDP 28  PVR = 0.8 [place]  Moderate tubular mid-LAD lesion, otherwise no significant obstructive CAD.   Review of systems complete and found to be negative unless listed in HPI.    Past Medical History:  Diagnosis Date  . 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 Diabetes mellitus    adult-onset, type 2  . Exogenous obesity   . GERD (gastroesophageal reflux disease)   . Hypertension     Current Outpatient Prescriptions  Medication Sig Dispense Refill  . acetaminophen (TYLENOL) 500 MG tablet Take 1,000 mg by mouth every 6 (six) hours as needed for moderate pain or headache.    . aspirin 81 MG tablet Take 81 mg by mouth daily.    . carvedilol (COREG) 25 MG tablet Take 1 tablet (25 mg total) by mouth 2 (two) times daily  with a meal. 60 tablet 0  . diclofenac sodium (VOLTAREN) 1 % GEL Apply 4 g topically 4 (four) times daily. 3 Tube 1  . ferrous sulfate 325 (65 FE) MG tablet Take 1 tablet (325 mg total) by mouth 2 (two) times daily with a meal. 60 tablet 0  . gabapentin (NEURONTIN) 300 MG capsule Take 1 capsule (300 mg total) by mouth at bedtime. 90 capsule 1  . glucose blood (FREESTYLE TEST STRIPS) test strip Use as instructed 100 each 0  . glucose monitoring kit (FREESTYLE) monitoring kit 1 each by Does not apply route 4 (four) times daily - after meals and at bedtime. 1 month Diabetic Testing Supplies for QAC-QHS accuchecks. 1 each 1  . hydrALAZINE (APRESOLINE) 50 MG tablet Take 1.5 tablets (75 mg total) by mouth 3 (three) times daily. 180 tablet 0  . HYDROcodone-acetaminophen (NORCO) 10-325 MG tablet Take 1 tablet by mouth every 6 (six) hours as needed for severe pain. 20 tablet 0  . insulin aspart (NOVOLOG FLEXPEN) 100 UNIT/ML FlexPen 0-15 Units, Subcutaneous, 3 times daily with meals CBG < 70: implement hypoglycemia protocol-call MD CBG 70 - 120: 0 units CBG 121 - 150: 2 units CBG 151 - 200: 3 units CBG 201 - 250: 5 units CBG 251 - 300: 8 units   CBG 301 - 350: 11 units CBG 351 - 400: 15 units CBG > 400:Call MD 15 mL 0  . Insulin Glargine (LANTUS SOLOSTAR) 100 UNIT/ML Solostar Pen Inject 15 Units into the skin every morning. And pen needles 1/day 5 pen 0  . Insulin Pen Needle 32G X 8 MM MISC Use as directed 100 each 0  . isosorbide mononitrate (IMDUR) 60 MG 24 hr tablet Take 1.5 tablets (90 mg total) by mouth daily. 120 tablet 0  . Lancets (FREESTYLE) lancets Use as instructed 100 each 0  . Lidocaine 3.75 % CREA Apply 1 application topically 3 (three) times daily as needed. 60 g 1  . pantoprazole (PROTONIX) 40 MG tablet Take 1 tablet (40 mg total) by mouth daily. 30 tablet 0  . potassium chloride SA (K-DUR,KLOR-CON) 20 MEQ tablet Take 2 tablets (40 mEq total) by mouth daily. 60 tablet 3  . torsemide  (DEMADEX) 20 MG tablet Take 3 tablets (60 mg total) by mouth daily. 90 tablet 6   No current facility-administered medications for this encounter.      PHYSICAL EXAM: Vitals:   10/11/16 1519  BP: (!) 146/102  Pulse: 87  SpO2: 97%  Weight: 270 lb 9.6 oz (122.7 kg)   Wt Readings from Last 3 Encounters:  10/11/16 270 lb 9.6 oz (122.7 kg)  10/06/16 275 lb (124.7 kg)  09/26/16 268 lb 3 oz (121.6 kg)    General: Well appearing. No resp difficulty. HEENT: normal Neck: supple. JVD 5-6. Carotids 2+ bilat; no bruits. No thyromegaly or nodule noted. Cor: PMI nondisplaced. RRR, No M/G/R noted Lungs: CTAB, normal effort. Abdomen: soft, non-tender, distended, no HSM. No bruits or masses. +BS  Extremities: no cyanosis, clubbing, rash, R and LLE no edema.  Neuro: alert & orientedx3, cranial nerves grossly intact. moves all 4 extremities w/o difficulty. Affect pleasant   ASSESSMENT & PLAN:  1. Chronic Systolic Heart Failure NICM. ECHO 07/2016 EF 25-30%. Plan to repeat ECHO the end of June to see if EF improves back on HF medications.  Admitted the end of February with marked volume overload in the setting of medication compliance.  - NYHA II. Volume status stable. Continue torsemide 60 mg daily. Can take extra 20 mg as needed for weight gain.  - Continue coreg 25 mg twice a day.  - Increase hydralazine 100 mg TID + 90 mg imdur daily. - Reinforced fluid restriction to < 2 L daily, sodium restriction to less than 2000 mg daily, and the importance of daily weights.   2. L Knee Septic Arhtritis- S/P Lknee wash out with steptococcus.  - Completed IV ABX. PICC removed.  3. HTN:  - Mildly elevated here and in paramedicine follow up.  Meds as above.  4. CKD Stage III- Creatinine baseline 2.3-2.6  - BMET today.  5. Obesity - Weight trending down. Discussed diet and exercise. Body mass index is 37.74 kg/m.  6. Shingles on R arm.  - completed " shingles pill" per patient - States gabapentin was  helping his arm but making his knee hurt. Encouraged PCP follow up.    Follow up in 6 weeks with Echo. If EF remains depressed may need ICD consideration.   Shirley Friar, PA-C  3:27 PM   Greater than 50% of the 25 minute visit was spent in counseling/coordination of care regarding disease state education, medication compliance, medication reconciliation, and fluid/diet education.

## 2016-10-11 NOTE — Progress Notes (Signed)
Paramedicine Encounter   Patient ID: Joshua Massey , male,   DOB: October 02, 1975,40 y.o.,  MRN: [id number]   Met patient in clinic today with provider.  Time spent with patient 30 min   Weight today-269  Recent knee surgery, walking with cane. No sob. Weakness to knee when walking due to that. Med change in provider notes. Hydralazine being increased. He reports that the gabapentin was making his knee hurt worse, he was advised to let the PCP know, recently had treatment for shingles. Labs done today. Potassium low last week, will recheck it today. Scales provided in office today.   Marylouise Stacks, EMT-Paramedic 10/11/2016   ACTION: Home visit completed

## 2016-10-12 ENCOUNTER — Other Ambulatory Visit (HOSPITAL_COMMUNITY): Payer: Self-pay | Admitting: Cardiology

## 2016-10-12 LAB — VIRAL CULTURE VIRC

## 2016-10-20 ENCOUNTER — Other Ambulatory Visit (HOSPITAL_COMMUNITY): Payer: Self-pay

## 2016-10-20 NOTE — Progress Notes (Signed)
Paramedicine Encounter    Patient ID: Joshua Massey, male    DOB: Jun 21, 1975, 41 y.o.   MRN: [id number]    Patient Care Team: [name] [name], MD as PCP - General (Family Medicine)  Patient Active Problem List   Diagnosis Date Noted  . Shingles 10/11/2016  . Status post peripherally inserted central catheter (PICC) central line placement 09/12/2016  . Medication monitoring encounter 09/12/2016  . Arthritis, septic ([place])   . CKD (chronic kidney disease), stage III   . AKI (acute kidney injury) ([place])   . Peripheral edema   . Acute renal failure ([place]) 08/17/2016  . Acute systolic CHF (congestive heart failure) ([place]) 08/17/2016  . Type 2 diabetes mellitus with vascular disease ([place]) 10/21/2013  . Chronic systolic heart failure ([place]) 02/27/2012  . Coronary artery disease, 50% LAD at cath 02/16/12 02/23/2012  . Family history of coronary artery disease 02/23/2012  . Cardiogenic shock ([place]) 02/17/2012  . NSVT (nonsustained ventricular tachycardia) ([place]) 02/16/2012  . Obesity 02/16/2012  . Acute on chronic systolic CHF (congestive heart failure) ([place]) 02/16/2012  . CHF (congestive heart failure) ([place]) 02/15/2012  . Hypertension 02/15/2012  . Non-ischemic cardiomyopathy, EF < 20% 02/15/2012  . TINEA CRURIS 09/25/2009  . ALLERGIC RHINITIS CAUSE UNSPECIFIED 09/25/2009  . SCROTAL ABSCESS 09/17/2009  . Essential hypertension 01/26/2009    Current Outpatient Prescriptions:  .  aspirin 81 MG tablet, Take 81 mg by mouth daily., Disp: , Rfl:  .  carvedilol (COREG) 25 MG tablet, Take 1 tablet (25 mg total) by mouth 2 (two) times daily with a meal., Disp: 60 tablet, Rfl: 0 .  gabapentin (NEURONTIN) 300 MG capsule, Take 1 capsule (300 mg total) by mouth at bedtime., Disp: 90 capsule, Rfl: 1 .  glucose blood (FREESTYLE TEST STRIPS) test strip, Use as instructed, Disp: 100 each, Rfl: 0 .  glucose monitoring kit (FREESTYLE) monitoring kit, 1 each by Does not apply route 4 (four) times daily -  after meals and at bedtime. 1 month Diabetic Testing Supplies for QAC-QHS accuchecks., Disp: 1 each, Rfl: 1 .  hydrALAZINE (APRESOLINE) 100 MG tablet, Take 1 tablet (100 mg total) by mouth 3 (three) times daily., Disp: 270 tablet, Rfl: 3 .  HYDROcodone-acetaminophen (NORCO) 10-325 MG tablet, Take 1 tablet by mouth every 6 (six) hours as needed for severe pain., Disp: 20 tablet, Rfl: 0 .  insulin aspart (NOVOLOG FLEXPEN) 100 UNIT/ML FlexPen, 0-15 Units, Subcutaneous, 3 times daily with meals CBG < 70: implement hypoglycemia protocol-call MD CBG 70 - 120: 0 units CBG 121 - 150: 2 units CBG 151 - 200: 3 units CBG 201 - 250: 5 units CBG 251 - 300: 8 units CBG 301 - 350: 11 units CBG 351 - 400: 15 units CBG > 400:Call MD, Disp: 15 mL, Rfl: 0 .  isosorbide mononitrate (IMDUR) 60 MG 24 hr tablet, Take 1.5 tablets (90 mg total) by mouth daily., Disp: 120 tablet, Rfl: 0 .  pantoprazole (PROTONIX) 40 MG tablet, Take 1 tablet (40 mg total) by mouth daily., Disp: 30 tablet, Rfl: 0 .  potassium chloride SA (K-DUR,KLOR-CON) 20 MEQ tablet, Take 2 tablets (40 mEq total) by mouth daily., Disp: 60 tablet, Rfl: 3 .  torsemide (DEMADEX) 20 MG tablet, Take 3 tablets (60 mg total) by mouth daily., Disp: 90 tablet, Rfl: 6 .  acetaminophen (TYLENOL) 500 MG tablet, Take 1,000 mg by mouth every 6 (six) hours as needed for moderate pain or headache., Disp: , Rfl:  .  diclofenac sodium (  VOLTAREN) 1 % GEL, Apply 4 g topically 4 (four) times daily., Disp: 3 Tube, Rfl: 1 .  ferrous sulfate 325 (65 FE) MG tablet, Take 1 tablet (325 mg total) by mouth 2 (two) times daily with a meal., Disp: 60 tablet, Rfl: 0 .  Insulin Glargine (LANTUS SOLOSTAR) 100 UNIT/ML Solostar Pen, Inject 15 Units into the skin every morning. And pen needles 1/day, Disp: 5 pen, Rfl: 0 .  Insulin Pen Needle 32G X 8 MM MISC, Use as directed, Disp: 100 each, Rfl: 0 .  Lancets (FREESTYLE) lancets, Use as instructed, Disp: 100 each, Rfl: 0 .  Lidocaine 3.75 % CREA,  Apply 1 application topically 3 (three) times daily as needed., Disp: 60 g, Rfl: 1 No Known Allergies   Social History   Social History  . Marital status: Single    Spouse name: N/A  . Number of children: 6  . Years of education: 33   Occupational History  . 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 Smoking status: Never Smoker  . Smokeless tobacco: Never Used  . Alcohol use No  . Drug use: No  . Sexual activity: Yes    Birth control/ protection: Condom   Other Topics Concern  . Not on file   Social History Narrative  . No narrative on file    Physical Exam  Eyes: Pupils are equal, round, and reactive to light.  Pulmonary/Chest: No respiratory distress. He has no wheezes. He has no rales.  Abdominal: He exhibits no distension. There is no tenderness. There is no guarding.  Musculoskeletal: He exhibits no edema.  Skin: Skin is warm and dry. He is not diaphoretic.        SAFE - 10/06/16 1500      Situation   Heart failure history Exisiting   Comorbidities DM;HTN   Readmitted within 30 days No   Hospital admission within past 12 months Yes   number of hospital admissions 1   number of ED visits --  1   Target Weight 220 lbs     Assessment   Lives alone --  wife, 2 kids    Primary support person delissa Meeker [phone]   Mode of transportation personal car   Other services involved None   Home equipement Cane;Scale;Walker     Weight   Weighs self daily Yes   Scale provided Yes   Records on weight chart No     Resources   Has "Living better w/heart failure" book Yes   Has HF Zone tool Yes   Able to identify yellow zone signs/when to call MD Yes   Records zone daily Yes     Medications   Uses a pill box Yes   Who stocks the pill box --  self   Pill box checked this visit Yes   Pill box refilled this visit Yes   Mail order medications No   Missed one or more doses of  medications per week No     Nutrition   Patient receives meals on wheels No   Patient follows low sodium diet Yes   Has foods at home that meet the current recommended diet Yes   Patient follows low sugar/card diet Yes   Nutritional concerns/issues n     Activity Level   ADL's/Mobility Independent   How many feet can patient ambulate 75     Urine  Difficulty urinating No   Changes in urine None     Time spent with patient   Time spent with patient  50 Minutes        Future Appointments Date Time Provider [place]  11/28/2016 11:30 AM [name] [name], MD [facility]  12/12/2016 1:00 PM [place] ECHO 1-BUZZ [facility]  12/12/2016 2:00 PM [facility] CLINIC [facility] None    ATF pt CAo x4 sitting in the living room at his parents house. Pt stated hat his right arm still has nerve pain since he was diagnosed with shingles.  Pt is still taking gabapentin for the pain at night.  Pt has no other complaints today.  Pt denies sob, headache, lightheadedness, and chest pain. Pt found out that the pharmacy did not have his insurance card on file before.  Pt is now paying a lot less for his medications. Pt has been taking his medications without difficulty.  Pt stated that he has been eating foods that is supposed to help with the nerve pain.  Pt stated that he has been very careful with diet and fluid intake.  Pt had fill his pill box prior to my visit. rx bottles and pill box verified.  Pt has not missed any doses.    BP 114/80 (BP Location: Left Arm, Patient Position: Sitting, Cuff Size: Normal)   Pulse 89   Resp 16   Wt 268 lb (121.6 kg)   SpO2 97%   BMI 37.38 kg/m  cbg 139  Weight yesterday-269 Last visit weight-275 (pt was out of lasix during this visit) ** pt is still out of Ferrous sulfate and panatzole  Bakary Bramer, EMT Paramedic 10/20/2016   ACTION: Home visit completed Next visit planned for next friday

## 2016-10-28 ENCOUNTER — Other Ambulatory Visit: Payer: Self-pay | Admitting: Family Medicine

## 2016-10-28 ENCOUNTER — Other Ambulatory Visit (HOSPITAL_COMMUNITY): Payer: Self-pay

## 2016-10-28 NOTE — Telephone Encounter (Signed)
demetria,  pt's caregiver is calling for pt for a refill of ferrous sulfate 325 (65 FE) MG tablet pantoprazole (PROTONIX) 40 MG tablet  Pt has called the pharmacy several days ago, but we do not have a record of this request.  [place], [place] - [address]

## 2016-10-28 NOTE — Progress Notes (Signed)
Paramedicine Encounter    Patient ID: Joshua Massey, male    DOB: 07/11/1975, 41 y.o.   MRN: [id number]    Patient Care Team: [name], [name], MD as PCP - General (Family Medicine)  Patient Active Problem List   Diagnosis Date Noted  . Shingles 10/11/2016  . Status post peripherally inserted central catheter (PICC) central line placement 09/12/2016  . Medication monitoring encounter 09/12/2016  . Arthritis, septic ([place])   . CKD (chronic kidney disease), stage III   . AKI (acute kidney injury) ([place])   . Peripheral edema   . Acute renal failure ([place]) 08/17/2016  . Acute systolic CHF (congestive heart failure) ([place]) 08/17/2016  . Type 2 diabetes mellitus with vascular disease ([place]) 10/21/2013  . Chronic systolic heart failure ([place]) 02/27/2012  . Coronary artery disease, 50% LAD at cath 02/16/12 02/23/2012  . Family history of coronary artery disease 02/23/2012  . Cardiogenic shock ([place]) 02/17/2012  . NSVT (nonsustained ventricular tachycardia) ([place]) 02/16/2012  . Obesity 02/16/2012  . Acute on chronic systolic CHF (congestive heart failure) ([place]) 02/16/2012  . CHF (congestive heart failure) ([place]) 02/15/2012  . Hypertension 02/15/2012  . Non-ischemic cardiomyopathy, EF < 20% 02/15/2012  . TINEA CRURIS 09/25/2009  . ALLERGIC RHINITIS CAUSE UNSPECIFIED 09/25/2009  . SCROTAL ABSCESS 09/17/2009  . Essential hypertension 01/26/2009    Current Outpatient Prescriptions:  .  aspirin 81 MG tablet, Take 81 mg by mouth daily., Disp: , Rfl:  .  carvedilol (COREG) 25 MG tablet, Take 1 tablet (25 mg total) by mouth 2 (two) times daily with a meal., Disp: 60 tablet, Rfl: 0 .  ferrous sulfate 325 (65 FE) MG tablet, Take 1 tablet (325 mg total) by mouth 2 (two) times daily with a meal., Disp: 60 tablet, Rfl: 0 .  hydrALAZINE (APRESOLINE) 100 MG tablet, Take 1 tablet (100 mg total) by mouth 3 (three) times daily., Disp: 270 tablet, Rfl: 3 .  isosorbide mononitrate (IMDUR) 60 MG 24 hr tablet,  Take 1.5 tablets (90 mg total) by mouth daily., Disp: 120 tablet, Rfl: 0 .  pantoprazole (PROTONIX) 40 MG tablet, Take 1 tablet (40 mg total) by mouth daily., Disp: 30 tablet, Rfl: 0 .  potassium chloride SA (K-DUR,KLOR-CON) 20 MEQ tablet, Take 2 tablets (40 mEq total) by mouth daily., Disp: 60 tablet, Rfl: 3 .  torsemide (DEMADEX) 20 MG tablet, Take 3 tablets (60 mg total) by mouth daily., Disp: 90 tablet, Rfl: 6 .  acetaminophen (TYLENOL) 500 MG tablet, Take 1,000 mg by mouth every 6 (six) hours as needed for moderate pain or headache., Disp: , Rfl:  .  diclofenac sodium (VOLTAREN) 1 % GEL, Apply 4 g topically 4 (four) times daily., Disp: 3 Tube, Rfl: 1 .  gabapentin (NEURONTIN) 300 MG capsule, Take 1 capsule (300 mg total) by mouth at bedtime., Disp: 90 capsule, Rfl: 1 .  glucose blood (FREESTYLE TEST STRIPS) test strip, Use as instructed, Disp: 100 each, Rfl: 0 .  glucose monitoring kit (FREESTYLE) monitoring kit, 1 each by Does not apply route 4 (four) times daily - after meals and at bedtime. 1 month Diabetic Testing Supplies for QAC-QHS accuchecks., Disp: 1 each, Rfl: 1 .  HYDROcodone-acetaminophen (NORCO) 10-325 MG tablet, Take 1 tablet by mouth every 6 (six) hours as needed for severe pain., Disp: 20 tablet, Rfl: 0 .  insulin aspart (NOVOLOG FLEXPEN) 100 UNIT/ML FlexPen, 0-15 Units, Subcutaneous, 3 times daily with meals CBG < 70: implement hypoglycemia protocol-call MD CBG 70 - 120: 0  units CBG 121 - 150: 2 units CBG 151 - 200: 3 units CBG 201 - 250: 5 units CBG 251 - 300: 8 units CBG 301 - 350: 11 units CBG 351 - 400: 15 units CBG > 400:Call MD, Disp: 15 mL, Rfl: 0 .  Insulin Glargine (LANTUS SOLOSTAR) 100 UNIT/ML Solostar Pen, Inject 15 Units into the skin every morning. And pen needles 1/day, Disp: 5 pen, Rfl: 0 .  Insulin Pen Needle 32G X 8 MM MISC, Use as directed, Disp: 100 each, Rfl: 0 .  Lancets (FREESTYLE) lancets, Use as instructed, Disp: 100 each, Rfl: 0 .  Lidocaine 3.75 % CREA,  Apply 1 application topically 3 (three) times daily as needed., Disp: 60 g, Rfl: 1 No Known Allergies   Social History   Social History  . Marital status: Single    Spouse name: N/A  . Number of children: 6  . Years of education: 12   Occupational History  . 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 Smoking status: Never Smoker  . Smokeless tobacco: Never Used  . Alcohol use No  . Drug use: No  . Sexual activity: Yes    Birth control/ protection: Condom   Other Topics Concern  . Not on file   Social History Narrative  . No narrative on file    Physical Exam  Eyes: Pupils are equal, round, and reactive to light.  Pulmonary/Chest: No respiratory distress. He has no wheezes. He has no rales.  Abdominal: He exhibits no distension. There is no tenderness. There is no guarding.  Musculoskeletal: He exhibits no edema.  Skin: Skin is warm and dry. He is not diaphoretic.        SAFE - 10/06/16 1500      Situation   Heart failure history Exisiting   Comorbidities DM;HTN   Readmitted within 30 days No   Hospital admission within past 12 months Yes   number of hospital admissions 1   number of ED visits --  1   Target Weight 220 lbs     Assessment   Lives alone --  wife, 2 kids    Primary support person Joshua Massey [phone]   Mode of transportation personal car   Other services involved None   Home equipement Cane;Scale;Walker     Weight   Weighs self daily Yes   Scale provided Yes   Records on weight chart No     Resources   Has "Living better w/heart failure" book Yes   Has HF Zone tool Yes   Able to identify yellow zone signs/when to call MD Yes   Records zone daily Yes     Medications   Uses a pill box Yes   Who stocks the pill box --  self   Pill box checked this visit Yes   Pill box refilled this visit Yes   Mail order medications No   Missed one or more doses of  medications per week No     Nutrition   Patient receives meals on wheels No   Patient follows low sodium diet Yes   Has foods at home that meet the current recommended diet Yes   Patient follows low sugar/card diet Yes   Nutritional concerns/issues n     Activity Level   ADL's/Mobility Independent   How many feet can patient ambulate 75     Urine  Difficulty urinating No   Changes in urine None     Time spent with patient   Time spent with patient  50 Minutes        Future Appointments Date Time Provider [place]  11/28/2016 11:30 AM [name], [name], MD [facility]  12/12/2016 1:00 PM [place] ECHO 1-BUZZ [facility]  12/12/2016 2:00 PM [facility] CLINIC [facility] None    ATF pt CAO x4 c/o pain in his right hand due to hx of shingles. pt stated that the pain has gotten better in his arm.  Pt has no other complaints at this time. He stated that he has been eating foods that are supposed to help with nerve pain, greens and spinach.  Pt stated that maybe why he is lossing weight. Pt walked around [place] yesterday and did not become sob or tired.  Pt stated that he is beginning to increase his activities. Still out of ferrous sulfate, pantoprazole; I called [facility] and the nurse stated that she will approve the refill.  Pt's pill box verified.  **Pt stated stated that he tried to call in potassium, torsemide too early   BP 112/70 (BP Location: Left Arm, Patient Position: Sitting, Cuff Size: Normal)   Pulse 94   Resp 16   Wt 263 lb (119.3 kg)   SpO2 97%   BMI 36.68 kg/m  cbg 142 Weight yesterday-264 Last visit weight-268    Nyoka Alcoser, EMT Paramedic 10/28/2016    ACTION: Home visit completed

## 2016-11-02 ENCOUNTER — Other Ambulatory Visit: Payer: Self-pay | Admitting: Family Medicine

## 2016-11-02 MED ORDER — PANTOPRAZOLE SODIUM 40 MG PO TBEC: 40.0000 mg | DELAYED_RELEASE_TABLET | Freq: Every day | ORAL | 3 refills | Status: DC

## 2016-11-02 MED ORDER — FERROUS SULFATE 325 (65 FE) MG PO TABS: 325.0000 mg | ORAL_TABLET | Freq: Two times a day (BID) | ORAL | 3 refills | Status: DC

## 2016-11-02 NOTE — Telephone Encounter (Signed)
Rx's were sent. Please remind Joshua Massey to follow as recommended.  Thanks, BJ

## 2016-11-02 NOTE — Telephone Encounter (Signed)
Pt has appointment in June.  

## 2016-11-23 ENCOUNTER — Telehealth: Payer: Self-pay | Admitting: Family Medicine

## 2016-11-23 ENCOUNTER — Other Ambulatory Visit: Payer: Self-pay | Admitting: Cardiovascular Disease

## 2016-11-23 DIAGNOSIS — I10 Essential (primary) hypertension: Secondary | ICD-10-CM

## 2016-11-23 NOTE — Telephone Encounter (Signed)
Is he taking 60 mg of the Imdur or 90 mg?   - In your note it says 60 mg daily, but the hospital has it as 90 mg.

## 2016-11-23 NOTE — Telephone Encounter (Signed)
According to his medication list, if accurate, her is taking Imdur 60 mg 1.5 tab daily. To be sure you could call pt and verify dose.  Thanks, BJ

## 2016-11-23 NOTE — Telephone Encounter (Signed)
Pt need new Rx for carvedilol and isosorbide mononitrate  Pharm:  [facility]

## 2016-11-25 MED ORDER — CARVEDILOL 25 MG PO TABS
25.0000 mg | ORAL_TABLET | Freq: Two times a day (BID) | ORAL | 1 refills | Status: DC
Start: 2016-11-25 — End: 2017-08-18

## 2016-11-25 NOTE — Telephone Encounter (Signed)
Left voicemail for patient to call the office back.  Rx for Carvedilol sent.

## 2016-11-27 DIAGNOSIS — E1169 Type 2 diabetes mellitus with other specified complication: Secondary | ICD-10-CM | POA: Insufficient documentation

## 2016-11-27 DIAGNOSIS — E785 Hyperlipidemia, unspecified: Secondary | ICD-10-CM

## 2016-11-27 NOTE — Progress Notes (Deleted)
HPI:   [name] is a 41 y.o. male, who is here today for 2 months follow up.   He was last seen on 09/26/16, when he was Dx with zoster infection. 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 *** Since his last OV he has follow with CHF clinic, 10/11/16.    Hypertension:  Hx of CHF, he is on Demadex 20 mg 3 tabs  Also on Coreg 25 mg bid, Imdur 60 mg 1.5 tabs daily and Hydralazine 100 mg tid.   HypoK+ on K-DUR 20 meq 2 tabs daily.   Lab Results  Component Value Date   CREATININE 2.03 (H) 10/11/2016   BUN 50 (H) 10/11/2016   NA 137 10/11/2016   K 4.1 10/11/2016   CL 105 10/11/2016   CO2 26 10/11/2016    Diabetes Mellitus II:    Currently on Lantus 15 U and Novolog sliding scale..     Lab Results  Component Value Date   HGBA1C 11.2 (H) 08/17/2016   Lab Results  Component Value Date   MICROALBUR 103.5 Repeated and verified X2. (H) 10/21/2013    HLD:   Lab Results  Component Value Date   CHOL 129 01/01/2013   HDL 28 (L) 01/01/2013   LDLCALC 60 01/01/2013   TRIG 204 (H) 01/01/2013   CHOLHDL 4.6 01/01/2013      Review of Systems    Current Outpatient Prescriptions on File Prior to Visit  Medication Sig Dispense Refill  . acetaminophen (TYLENOL) 500 MG tablet Take 1,000 mg by mouth every 6 (six) hours as needed for moderate pain or headache.    Marland Kitchen aspirin 81 MG tablet Take 81 mg by mouth daily.    . carvedilol (COREG) 25 MG tablet Take 1 tablet (25 mg total) by mouth 2 (two) times daily with a meal. 180 tablet 1  . diclofenac sodium (VOLTAREN) 1 % GEL Apply 4 g topically 4 (four) times daily. 3 Tube 1  . ferrous sulfate 325 (65 FE) MG tablet Take 1 tablet (325 mg total) by mouth 2 (two) times daily with a meal. 60 tablet 3  . gabapentin (NEURONTIN) 300 MG capsule Take 1 capsule (300 mg total) by mouth at bedtime. 90 capsule 1  . glucose blood (FREESTYLE TEST STRIPS) test strip Use as instructed 100 each 0  .  glucose monitoring kit (FREESTYLE) monitoring kit 1 each by Does not apply route 4 (four) times daily - after meals and at bedtime. 1 month Diabetic Testing Supplies for QAC-QHS accuchecks. 1 each 1  . hydrALAZINE (APRESOLINE) 100 MG tablet Take 1 tablet (100 mg total) by mouth 3 (three) times daily. 270 tablet 3  . HYDROcodone-acetaminophen (NORCO) 10-325 MG tablet Take 1 tablet by mouth every 6 (six) hours as needed for severe pain. 20 tablet 0  . insulin aspart (NOVOLOG FLEXPEN) 100 UNIT/ML FlexPen 0-15 Units, Subcutaneous, 3 times daily with meals CBG < 70: implement hypoglycemia protocol-call MD CBG 70 - 120: 0 units CBG 121 - 150: 2 units CBG 151 - 200: 3 units CBG 201 - 250: 5 units CBG 251 - 300: 8 units CBG 301 - 350: 11 units CBG 351 - 400: 15 units CBG > 400:Call MD 15 mL 0  . Insulin Glargine (LANTUS SOLOSTAR) 100 UNIT/ML Solostar Pen Inject 15 Units into the skin every morning. And pen needles 1/day 5 pen 0  . Insulin Pen Needle 32G X 8 MM MISC Use as directed 100  each 0  . isosorbide mononitrate (IMDUR) 60 MG 24 hr tablet Take 1.5 tablets (90 mg total) by mouth daily. 120 tablet 0  . Lancets (FREESTYLE) lancets Use as instructed 100 each 0  . Lidocaine 3.75 % CREA Apply 1 application topically 3 (three) times daily as needed. 60 g 1  . pantoprazole (PROTONIX) 40 MG tablet Take 1 tablet (40 mg total) by mouth daily before breakfast. 30 tablet 3  . potassium chloride SA (K-DUR,KLOR-CON) 20 MEQ tablet Take 2 tablets (40 mEq total) by mouth daily. 60 tablet 3  . torsemide (DEMADEX) 20 MG tablet Take 3 tablets (60 mg total) by mouth daily. 90 tablet 6   No current facility-administered medications on file prior to visit.      Past Medical History:  Diagnosis Date  . 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 Diabetes mellitus    adult-onset, type 2  . Exogenous obesity   . GERD (gastroesophageal reflux disease)   . Hypertension    No Known  Allergies  Social History   Social History  . Marital status: Single    Spouse name: N/A  . Number of children: 6  . Years of education: 65   Occupational History  . 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 Smoking status: Never Smoker  . Smokeless tobacco: Never Used  . Alcohol use No  . Drug use: No  . Sexual activity: Yes    Birth control/ protection: Condom   Other Topics Concern  . Not on file   Social History Narrative  . No narrative on file    There were no vitals filed for this visit. There is no height or weight on file to calculate BMI.   Physical Exam  [12+ exam elements] ***  ASSESSMENT AND PLAN:   [name] was seen today for *** follow-up and the following were addressed:   There are no diagnoses linked to this encounter.         -[name] was advised to return sooner than planned today if new concerns arise.       Jacy Howat G. [name], MD  [facility]. [place] office.

## 2016-11-28 ENCOUNTER — Ambulatory Visit: Payer: Self-pay | Admitting: Family Medicine

## 2016-11-28 DIAGNOSIS — Z0289 Encounter for other administrative examinations: Secondary | ICD-10-CM

## 2016-11-28 NOTE — Telephone Encounter (Signed)
Patient is coming in today, will discuss medication then.

## 2016-12-02 ENCOUNTER — Other Ambulatory Visit (HOSPITAL_COMMUNITY): Payer: Self-pay

## 2016-12-02 NOTE — Progress Notes (Signed)
Paramedicine Encounter    Patient ID: Joshua Massey, male    DOB: 11/24/1975, 41 y.o.   MRN: [id number]    Patient Care Team: [name], [name], MD as PCP - General (Family Medicine)  Patient Active Problem List   Diagnosis Date Noted  . Hyperlipidemia associated with type 2 diabetes mellitus ([place]) 11/27/2016  . Shingles 10/11/2016  . Status post peripherally inserted central catheter (PICC) central line placement 09/12/2016  . Medication monitoring encounter 09/12/2016  . Arthritis, septic ([place])   . CKD (chronic kidney disease), stage III   . AKI (acute kidney injury) ([place])   . Peripheral edema   . Acute renal failure ([place]) 08/17/2016  . Acute systolic CHF (congestive heart failure) ([place]) 08/17/2016  . Type 2 diabetes mellitus with vascular disease ([place]) 10/21/2013  . Chronic systolic heart failure ([place]) 02/27/2012  . Coronary artery disease, 50% LAD at cath 02/16/12 02/23/2012  . Family history of coronary artery disease 02/23/2012  . Cardiogenic shock ([place]) 02/17/2012  . NSVT (nonsustained ventricular tachycardia) ([place]) 02/16/2012  . Obesity 02/16/2012  . Acute on chronic systolic CHF (congestive heart failure) ([place]) 02/16/2012  . CHF (congestive heart failure) ([place]) 02/15/2012  . Hypertension 02/15/2012  . Non-ischemic cardiomyopathy, EF < 20% 02/15/2012  . TINEA CRURIS 09/25/2009  . ALLERGIC RHINITIS CAUSE UNSPECIFIED 09/25/2009  . SCROTAL ABSCESS 09/17/2009  . Essential hypertension 01/26/2009    Current Outpatient Prescriptions:  .  aspirin 81 MG tablet, Take 81 mg by mouth daily., Disp: , Rfl:  .  carvedilol (COREG) 25 MG tablet, Take 1 tablet (25 mg total) by mouth 2 (two) times daily with a meal., Disp: 180 tablet, Rfl: 1 .  ferrous sulfate 325 (65 FE) MG tablet, Take 1 tablet (325 mg total) by mouth 2 (two) times daily with a meal., Disp: 60 tablet, Rfl: 3 .  gabapentin (NEURONTIN) 300 MG capsule, Take 1 capsule (300 mg total) by mouth at bedtime., Disp: 90  capsule, Rfl: 1 .  hydrALAZINE (APRESOLINE) 100 MG tablet, Take 1 tablet (100 mg total) by mouth 3 (three) times daily., Disp: 270 tablet, Rfl: 3 .  isosorbide mononitrate (IMDUR) 60 MG 24 hr tablet, Take 1.5 tablets (90 mg total) by mouth daily., Disp: 120 tablet, Rfl: 0 .  pantoprazole (PROTONIX) 40 MG tablet, Take 1 tablet (40 mg total) by mouth daily before breakfast., Disp: 30 tablet, Rfl: 3 .  potassium chloride SA (K-DUR,KLOR-CON) 20 MEQ tablet, Take 2 tablets (40 mEq total) by mouth daily., Disp: 60 tablet, Rfl: 3 .  acetaminophen (TYLENOL) 500 MG tablet, Take 1,000 mg by mouth every 6 (six) hours as needed for moderate pain or headache., Disp: , Rfl:  .  diclofenac sodium (VOLTAREN) 1 % GEL, Apply 4 g topically 4 (four) times daily., Disp: 3 Tube, Rfl: 1 .  glucose blood (FREESTYLE TEST STRIPS) test strip, Use as instructed, Disp: 100 each, Rfl: 0 .  glucose monitoring kit (FREESTYLE) monitoring kit, 1 each by Does not apply route 4 (four) times daily - after meals and at bedtime. 1 month Diabetic Testing Supplies for QAC-QHS accuchecks., Disp: 1 each, Rfl: 1 .  HYDROcodone-acetaminophen (NORCO) 10-325 MG tablet, Take 1 tablet by mouth every 6 (six) hours as needed for severe pain., Disp: 20 tablet, Rfl: 0 .  insulin aspart (NOVOLOG FLEXPEN) 100 UNIT/ML FlexPen, 0-15 Units, Subcutaneous, 3 times daily with meals CBG < 70: implement hypoglycemia protocol-call MD CBG 70 - 120: 0 units CBG 121 - 150: 2 units CBG  151 - 200: 3 units CBG 201 - 250: 5 units CBG 251 - 300: 8 units CBG 301 - 350: 11 units CBG 351 - 400: 15 units CBG > 400:Call MD, Disp: 15 mL, Rfl: 0 .  Insulin Glargine (LANTUS SOLOSTAR) 100 UNIT/ML Solostar Pen, Inject 15 Units into the skin every morning. And pen needles 1/day, Disp: 5 pen, Rfl: 0 .  Insulin Pen Needle 32G X 8 MM MISC, Use as directed, Disp: 100 each, Rfl: 0 .  Lancets (FREESTYLE) lancets, Use as instructed, Disp: 100 each, Rfl: 0 .  Lidocaine 3.75 % CREA, Apply 1  application topically 3 (three) times daily as needed., Disp: 60 g, Rfl: 1 .  torsemide (DEMADEX) 20 MG tablet, Take 3 tablets (60 mg total) by mouth daily., Disp: 90 tablet, Rfl: 6 No Known Allergies   Social History   Social History  . Marital status: Single    Spouse name: N/A  . Number of children: 6  . Years of education: 32   Occupational History  . 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 Smoking status: Never Smoker  . Smokeless tobacco: Never Used  . Alcohol use No  . Drug use: No  . Sexual activity: Yes    Birth control/ protection: Condom   Other Topics Concern  . Not on file   Social History Narrative  . No narrative on file    Physical Exam  Eyes: Pupils are equal, round, and reactive to light.  Pulmonary/Chest: No respiratory distress. He has no wheezes. He has no rales.  Abdominal: He exhibits no distension. There is no tenderness. There is no guarding.  Musculoskeletal: He exhibits no edema.  Skin: Skin is warm and dry. He is not diaphoretic.        SAFE - 10/06/16 1500      Situation   Heart failure history Exisiting   Comorbidities DM;HTN   Readmitted within 30 days No   Hospital admission within past 12 months Yes   number of hospital admissions 1   number of ED visits --  1   Target Weight 220 lbs     Assessment   Lives alone --  wife, 2 kids    Primary support person delissa Heavin [phone]   Mode of transportation personal car   Other services involved None   Home equipement Cane;Scale;Walker     Weight   Weighs self daily Yes   Scale provided Yes   Records on weight chart No     Resources   Has "Living better w/heart failure" book Yes   Has HF Zone tool Yes   Able to identify yellow zone signs/when to call MD Yes   Records zone daily Yes     Medications   Uses a pill box Yes   Who stocks the pill box --  self   Pill box checked this visit Yes    Pill box refilled this visit Yes   Mail order medications No   Missed one or more doses of medications per week No     Nutrition   Patient receives meals on wheels No   Patient follows low sodium diet Yes   Has foods at home that meet the current recommended diet Yes   Patient follows low sugar/card diet Yes   Nutritional concerns/issues n     Activity Level   ADL's/Mobility Independent  How many feet can patient ambulate 75     Urine   Difficulty urinating No   Changes in urine None     Time spent with patient   Time spent with patient  50 Minutes        Future Appointments Date Time Provider [place]  12/12/2016 1:00 PM [facility]  12/12/2016 2:00 PM [facility] CLINIC [facility] None    ATF pt CAO x4 with no complaints.  Pt stated that he has been on vacation and its the reason for the cancelled appoinments.  He stated that he has been feeling good lately.  He still becomes sob during or after walking several feet.  He denies dizziness, headache and chest pain today.  He's still out of ferrous sulfate and isosorbide. He has called his primary physician for refill authorization.  He continues to eat low sodium foods and he is watching how much he drinks.  He hasn't missed any doses of meds besides the ones that he is currently out of.  He stated that carvedilol is ready for pick up from the pharmacy, he ran out yesterday. Pt's right hand and fingers are still numb since he had the shingles. rx bottles verified.   cbg 150  BP (!) 146/96 (BP Location: Right Arm, Patient Position: Sitting, Cuff Size: Normal)   Pulse 88   Resp 12   Wt 256 lb 12.8 oz (116.5 kg)   SpO2 98%   BMI 35.82 kg/m   Weight yesterday-257 Last visit weight-268    Chan Rosasco, EMT Paramedic 12/02/2016    ACTION: Home visit completed Next visit planned for 6/27

## 2016-12-12 ENCOUNTER — Encounter (HOSPITAL_COMMUNITY): Payer: Self-pay

## 2016-12-12 ENCOUNTER — Ambulatory Visit (HOSPITAL_COMMUNITY): Admission: RE | Admit: 2016-12-12 | Payer: BLUE CROSS/BLUE SHIELD | Source: Ambulatory Visit

## 2016-12-15 ENCOUNTER — Telehealth: Payer: Self-pay | Admitting: Family Medicine

## 2016-12-15 DIAGNOSIS — I10 Essential (primary) hypertension: Secondary | ICD-10-CM

## 2016-12-15 NOTE — Telephone Encounter (Signed)
Pt request refill  isosorbide mononitrate (IMDUR) 60 MG 24 hr tablet  Pt has appt on 7/03, but is out of medicine.  Pt would like to know if ok for refill to he gets in to see dr [name].   [place], [place] - [address]  Pt aware Dr [name] out of office today.

## 2016-12-16 ENCOUNTER — Encounter: Payer: Self-pay | Admitting: Cardiovascular Disease

## 2016-12-16 ENCOUNTER — Ambulatory Visit (INDEPENDENT_AMBULATORY_CARE_PROVIDER_SITE_OTHER): Payer: BLUE CROSS/BLUE SHIELD | Admitting: Cardiovascular Disease

## 2016-12-16 VITALS — BP 140/96 | HR 88 | Ht 71.0 in | Wt 262.2 lb

## 2016-12-16 DIAGNOSIS — I428 Other cardiomyopathies: Secondary | ICD-10-CM | POA: Diagnosis not present

## 2016-12-16 DIAGNOSIS — I10 Essential (primary) hypertension: Secondary | ICD-10-CM

## 2016-12-16 MED ORDER — ISOSORBIDE MONONITRATE ER 60 MG PO TB24: 90.0000 mg | ORAL_TABLET | Freq: Every day | ORAL | 0 refills | Status: DC

## 2016-12-16 NOTE — Telephone Encounter (Signed)
Rx sent 

## 2016-12-16 NOTE — Patient Instructions (Signed)

## 2016-12-16 NOTE — Assessment & Plan Note (Signed)
History of essential hypertension blood pressure measures 140/96. He is on carvedilol, hydralazine and Imdur. He is not on an ACE inhibitor or ARB because of moderate renal insufficiency.

## 2016-12-16 NOTE — Addendum Note (Signed)
Addended by: Kateri Mc E on: 12/16/2016 07:26 AM   Modules accepted: Orders

## 2016-12-16 NOTE — Progress Notes (Signed)
12/16/2016 Joshua Massey   19-Feb-1976  [id number]  Primary Physician [name], [name], MD Primary Cardiologist: Lorretta Harp MD [name]  HPI:  Joshua Massey is a 41 year old severely overweight single Caucasian male father of 6 children who worked at [employer], and was unfortunately laid off. 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 I last saw him in the office 04/06/16. He was previously a patient of Dr. Lowella Fairy and has seen Dr. Haroldine Laws in the heart failure clinic in the past as well. He has what is presumed to be hypertensive heart disease. He underwent right and left heart cath by Dr. Debara Pickett 03/19/12 revealing normal coronary arteries and elevated filling pressures. Since I saw him in the office in October he has been hospitalized with decompensated heart failure and anasarca from 08/16/16 through 09/02/16. He was diuresed 60 pounds. Discharge weight was 305. His echo revealed an EF of 25-30% with a moderate sized pericardial effusion but no Pericardial tamponade physiology.He has been compliant with his medication regimen and is aware of salt avoidance. He denies chest pain or shortness of breath.   Current Outpatient Prescriptions  Medication Sig Dispense Refill  . acetaminophen (TYLENOL) 500 MG tablet Take 1,000 mg by mouth every 6 (six) hours as needed for moderate pain or headache.    Marland Kitchen aspirin 81 MG tablet Take 81 mg by mouth daily.    . carvedilol (COREG) 25 MG tablet Take 1 tablet (25 mg total) by mouth 2 (two) times daily with a meal. 180 tablet 1  . diclofenac sodium (VOLTAREN) 1 % GEL Apply 4 g topically 4 (four) times daily. 3 Tube 1  . ferrous sulfate 325 (65 FE) MG tablet Take 1 tablet (325 mg total) by mouth 2 (two) times daily with a meal. 60 tablet 3  . gabapentin (NEURONTIN) 300 MG capsule Take 1 capsule (300 mg total) by mouth at bedtime. 90 capsule 1  . glucose blood (FREESTYLE TEST STRIPS) test strip Use as instructed 100  each 0  . glucose monitoring kit (FREESTYLE) monitoring kit 1 each by Does not apply route 4 (four) times daily - after meals and at bedtime. 1 month Diabetic Testing Supplies for QAC-QHS accuchecks. 1 each 1  . hydrALAZINE (APRESOLINE) 100 MG tablet Take 1 tablet (100 mg total) by mouth 3 (three) times daily. 270 tablet 3  . HYDROcodone-acetaminophen (NORCO) 10-325 MG tablet Take 1 tablet by mouth every 6 (six) hours as needed for severe pain. 20 tablet 0  . insulin aspart (NOVOLOG FLEXPEN) 100 UNIT/ML FlexPen 0-15 Units, Subcutaneous, 3 times daily with meals CBG < 70: implement hypoglycemia protocol-call MD CBG 70 - 120: 0 units CBG 121 - 150: 2 units CBG 151 - 200: 3 units CBG 201 - 250: 5 units CBG 251 - 300: 8 units CBG 301 - 350: 11 units CBG 351 - 400: 15 units CBG > 400:Call MD 15 mL 0  . Insulin Glargine (LANTUS SOLOSTAR) 100 UNIT/ML Solostar Pen Inject 15 Units into the skin every morning. And pen needles 1/day 5 pen 0  . Insulin Pen Needle 32G X 8 MM MISC Use as directed 100 each 0  . isosorbide mononitrate (IMDUR) 60 MG 24 hr tablet Take 1.5 tablets (90 mg total) by mouth daily. 135 tablet 0  . Lancets (FREESTYLE) lancets Use as instructed 100 each 0  . Lidocaine 3.75 % CREA Apply 1 application topically 3 (three) times daily as  needed. 60 g 1  . pantoprazole (PROTONIX) 40 MG tablet Take 1 tablet (40 mg total) by mouth daily before breakfast. 30 tablet 3  . potassium chloride SA (K-DUR,KLOR-CON) 20 MEQ tablet Take 2 tablets (40 mEq total) by mouth daily. 60 tablet 3  . torsemide (DEMADEX) 20 MG tablet Take 3 tablets (60 mg total) by mouth daily. 90 tablet 6   No current facility-administered medications for this visit.     No Known Allergies  Social History   Social History  . Marital status: Single    Spouse name: N/A  . Number of children: 6  . Years of education: 31   Occupational History  . 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 Smoking status: Never Smoker  . Smokeless tobacco: Never Used  . Alcohol use No  . Drug use: No  . Sexual activity: Yes    Birth control/ protection: Condom   Other Topics Concern  . Not on file   Social History Narrative  . No narrative on file     Review of Systems: General: negative for chills, fever, night sweats or weight changes.  Cardiovascular: negative for chest pain, dyspnea on exertion, edema, orthopnea, palpitations, paroxysmal nocturnal dyspnea or shortness of breath Dermatological: negative for rash Respiratory: negative for cough or wheezing Urologic: negative for hematuria Abdominal: negative for nausea, vomiting, diarrhea, bright red blood per rectum, melena, or hematemesis Neurologic: negative for visual changes, syncope, or dizziness All other systems reviewed and are otherwise negative except as noted above.    Blood pressure (!) 140/96, pulse 88, height 5' 11"  (1.803 m), weight 262 lb 3.2 oz (118.9 kg).  General appearance: alert and no distress Neck: no adenopathy, no carotid bruit, no JVD, supple, symmetrical, trachea midline and thyroid not enlarged, symmetric, no tenderness/mass/nodules Lungs: clear to auscultation bilaterally Heart: regular rate and rhythm, S1, S2 normal, no murmur, click, rub or gallop Extremities: extremities normal, atraumatic, no cyanosis or edema  EKG Sinus rhythm at 88 with left axis deviation and a nonspecific IVCD. I Personally reviewed this EKG.  ASSESSMENT AND PLAN:   Non-ischemic cardiomyopathy, EF < 20% History of nonischemic cardiomyopathy ejection fraction persistently in the 25-30% range by recent 2-D echo in February. He did have a heart catheterization performed 02/16/12 revealed nonobstructive CAD. He is on optimal medical therapy. He has been more compliant since his recent hospitalization back in February where he was diuresed 60 pounds. He had anasarca at that time. His  discharge weight was 305 and today his weight is 262. He appears euvolemic on exam. He is followed by the heart failure clinic. A follow-up 2-D echo is scheduled for August 31  Essential hypertension History of essential hypertension blood pressure measures 140/96. He is on carvedilol, hydralazine and Imdur. He is not on an ACE inhibitor or ARB because of moderate renal insufficiency.      Lorretta Harp MD FACP,FACC,FAHA, [facility] 12/16/2016 4:31 PM

## 2016-12-16 NOTE — Assessment & Plan Note (Signed)
History of nonischemic cardiomyopathy ejection fraction persistently in the 25-30% range by recent 2-D echo in February. He did have a heart catheterization performed 02/16/12 revealed nonobstructive CAD. He is on optimal medical therapy. He has been more compliant since his recent hospitalization back in February where he was diuresed 60 pounds. He had anasarca at that time. His discharge weight was 305 and today his weight is 262. He appears euvolemic on exam. He is followed by the heart failure clinic. A follow-up 2-D echo is scheduled for August 31

## 2016-12-20 ENCOUNTER — Encounter: Payer: Self-pay | Admitting: Family Medicine

## 2016-12-20 ENCOUNTER — Ambulatory Visit (INDEPENDENT_AMBULATORY_CARE_PROVIDER_SITE_OTHER): Payer: BLUE CROSS/BLUE SHIELD | Admitting: Family Medicine

## 2016-12-20 ENCOUNTER — Ambulatory Visit: Payer: Self-pay | Admitting: Family Medicine

## 2016-12-20 VITALS — BP 120/80 | HR 91 | Resp 12 | Ht 71.0 in | Wt 262.1 lb

## 2016-12-20 DIAGNOSIS — N183 Chronic kidney disease, stage 3 unspecified: Secondary | ICD-10-CM

## 2016-12-20 DIAGNOSIS — I10 Essential (primary) hypertension: Secondary | ICD-10-CM | POA: Diagnosis not present

## 2016-12-20 DIAGNOSIS — E1159 Type 2 diabetes mellitus with other circulatory complications: Secondary | ICD-10-CM

## 2016-12-20 DIAGNOSIS — E1142 Type 2 diabetes mellitus with diabetic polyneuropathy: Secondary | ICD-10-CM

## 2016-12-20 DIAGNOSIS — E049 Nontoxic goiter, unspecified: Secondary | ICD-10-CM | POA: Diagnosis not present

## 2016-12-20 DIAGNOSIS — M25562 Pain in left knee: Secondary | ICD-10-CM | POA: Diagnosis not present

## 2016-12-20 DIAGNOSIS — B0229 Other postherpetic nervous system involvement: Secondary | ICD-10-CM

## 2016-12-20 LAB — CBC WITH DIFFERENTIAL/PLATELET
BASOS PCT: 0 %
Basophils Absolute: 0 cells/uL (ref 0–200)
EOS ABS: 77 {cells}/uL (ref 15–500)
Eosinophils Relative: 1 %
HEMATOCRIT: 39.2 % (ref 38.5–50.0)
Hemoglobin: 12.7 g/dL — ABNORMAL LOW (ref 13.2–17.1)
LYMPHS PCT: 19 %
Lymphs Abs: 1463 cells/uL (ref 850–3900)
MCH: 28.2 pg (ref 27.0–33.0)
MCHC: 32.4 g/dL (ref 32.0–36.0)
MCV: 87.1 fL (ref 80.0–100.0)
MONO ABS: 539 {cells}/uL (ref 200–950)
MPV: 10.6 fL (ref 7.5–12.5)
Monocytes Relative: 7 %
NEUTROS ABS: 5621 {cells}/uL (ref 1500–7800)
Neutrophils Relative %: 73 %
Platelets: 358 10*3/uL (ref 140–400)
RBC: 4.5 MIL/uL (ref 4.20–5.80)
RDW: 17.2 % — ABNORMAL HIGH (ref 11.0–15.0)
WBC: 7.7 10*3/uL (ref 3.8–10.8)

## 2016-12-20 LAB — LIPID PANEL
CHOL/HDL RATIO: 3.3 ratio (ref <5.0)
CHOLESTEROL: 135 mg/dL (ref <200)
HDL: 41 mg/dL (ref >40)
LDL Cholesterol: 76 mg/dL (ref <100)
Triglycerides: 90 mg/dL (ref <150)
VLDL: 18 mg/dL (ref <30)

## 2016-12-20 LAB — BASIC METABOLIC PANEL
BUN: 65 mg/dL — AB (ref 7–25)
CHLORIDE: 103 mmol/L (ref 98–110)
CO2: 25 mmol/L (ref 20–31)
Calcium: 9.1 mg/dL (ref 8.6–10.3)
Creat: 2.63 mg/dL — ABNORMAL HIGH (ref 0.60–1.35)
Glucose, Bld: 154 mg/dL — ABNORMAL HIGH (ref 65–99)
POTASSIUM: 4.8 mmol/L (ref 3.5–5.3)
Sodium: 140 mmol/L (ref 135–146)

## 2016-12-20 MED ORDER — LIDOCAINE 3.75 % EX CREA: 1.0000 "application " | TOPICAL_CREAM | Freq: Three times a day (TID) | CUTANEOUS | 1 refills | Status: DC | PRN

## 2016-12-20 MED ORDER — DICLOFENAC SODIUM 1 % TD GEL: 4.0000 g | Freq: Four times a day (QID) | TRANSDERMAL | 1 refills | Status: DC

## 2016-12-20 MED ORDER — GABAPENTIN 300 MG PO CAPS: 300.0000 mg | ORAL_CAPSULE | Freq: Two times a day (BID) | ORAL | 3 refills | Status: DC

## 2016-12-20 NOTE — Progress Notes (Signed)
HPI:   Joshua Massey is a 41 y.o. male, who is here today for 2-3 months follow up.   He was last seen on 09/26/16, when he was Dx with zoster infection. 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 Lesions have resolved, he has residual hyperpigmented macular lesions and numbness.   Since his last OV he has follow with CHF clinic, 10/11/16.  Hx of left knee pain, s/p arthroscopy in 08/2016,s/p septic arthritis. He is still having pain, completed PT. According to pt, he is now following prn with Joshua Massey. Pain is moderate to severe, exacerbated by standing up, walking, he feels like his knee is giving up. Alleviated by rest. He is using Voltaren gel, which helps some.   Hypertension:  Hx of CHF, he is on Demadex 20 mg 3 tabs BP checks at [employer] or [employer] 130/90's. He is on Coreg 25 mg bid, Imdur 60 mg 1.5 tabs daily and Hydralazine 100 mg tid.  He is taking medication as instructed, denies side effects. HypoK+ on K-DUR 20 meq 2 tabs daily. CKD III, denies gross hematuria or foam in urine. CHF with LVEF 25-30% (08/17/16), he denies orthopnea or PND.  Denies headache, visual changes, chest pain, palpitation, claudication, focal weakness, or edema. Exertional dyspnea when he does "a lot" around the house.   Lab Results  Component Value Date   CREATININE 2.03 (H) 10/11/2016   BUN 50 (H) 10/11/2016   NA 137 10/11/2016   K 4.1 10/11/2016   CL 105 10/11/2016   CO2 26 10/11/2016    Diabetes Mellitus II:   Currently on Lantus 15 U and Novolog sliding scale (1-2 U in average per day) FG 130's and at bedtime 150's.  Eye exam: Never.  Lab Results  Component Value Date   HGBA1C 11.2 (H) 08/17/2016   Lab Results  Component Value Date   MICROALBUR 103.5 Repeated and verified X2. (H) 10/21/2013   Left foot numbness, stable.  HLD:  He is not on statin medication. He is following low fat diet.  + CAD.  Lab Results  Component Value  Date   CHOL 129 01/01/2013   HDL 28 (L) 01/01/2013   LDLCALC 60 01/01/2013   TRIG 204 (H) 01/01/2013   CHOLHDL 4.6 01/01/2013   Postherpetic neuropathy:  Still having numbness RUE on ulnar and medial aspect, areas where zoster rash was localized. Gabapentin 300 mg at bedtime helps but states that he can feel when it is "weaning off" because he starts with needle sticking sensation involving 4th and 5th fingers as well and palmar (4-5th metatarsus) and wrist.Numbness of medial aspect of forearm and arm. No limitation of ROM but "bad" pain. If he doe snot take med he feels needle sensation.   Enlarged thyroid on examination. No Hx of thyroid disease.  Lab Results  Component Value Date   TSH 2.641 08/17/2016    He is also asking if he would qualify for disability   Review of Systems  Constitutional: Negative for activity change, appetite change, fatigue and fever.  HENT: Negative for mouth sores, nosebleeds, sore throat and trouble swallowing.   Eyes: Negative for redness and visual disturbance.  Respiratory: Positive for shortness of breath. Negative for cough and wheezing.   Cardiovascular: Negative for chest pain, palpitations and leg swelling.  Gastrointestinal: Negative for abdominal pain, nausea and vomiting.       No changes in bowel habits.  Endocrine: Negative for polydipsia, polyphagia and  polyuria.  Genitourinary: Negative for decreased urine volume, dysuria and hematuria.  Musculoskeletal: Positive for arthralgias and gait problem. Negative for myalgias.  Skin: Negative for rash and wound.  Neurological: Positive for numbness. Negative for dizziness, syncope, weakness and headaches.  Psychiatric/Behavioral: Negative for confusion. The patient is not nervous/anxious.       Current Outpatient Prescriptions on File Prior to Visit  Medication Sig Dispense Refill  . acetaminophen (TYLENOL) 500 MG tablet Take 1,000 mg by mouth every 6 (six) hours as needed for moderate  pain or headache.    Marland Kitchen aspirin 81 MG tablet Take 81 mg by mouth daily.    . carvedilol (COREG) 25 MG tablet Take 1 tablet (25 mg total) by mouth 2 (two) times daily with a meal. 180 tablet 1  . ferrous sulfate 325 (65 FE) MG tablet Take 1 tablet (325 mg total) by mouth 2 (two) times daily with a meal. 60 tablet 3  . glucose blood (FREESTYLE TEST STRIPS) test strip Use as instructed 100 each 0  . glucose monitoring kit (FREESTYLE) monitoring kit 1 each by Does not apply route 4 (four) times daily - after meals and at bedtime. 1 month Diabetic Testing Supplies for QAC-QHS accuchecks. 1 each 1  . hydrALAZINE (APRESOLINE) 100 MG tablet Take 1 tablet (100 mg total) by mouth 3 (three) times daily. 270 tablet 3  . insulin aspart (NOVOLOG FLEXPEN) 100 UNIT/ML FlexPen 0-15 Units, Subcutaneous, 3 times daily with meals CBG < 70: implement hypoglycemia protocol-call MD CBG 70 - 120: 0 units CBG 121 - 150: 2 units CBG 151 - 200: 3 units CBG 201 - 250: 5 units CBG 251 - 300: 8 units CBG 301 - 350: 11 units CBG 351 - 400: 15 units CBG > 400:Call MD 15 mL 0  . Insulin Glargine (LANTUS SOLOSTAR) 100 UNIT/ML Solostar Pen Inject 15 Units into the skin every morning. And pen needles 1/day 5 pen 0  . Insulin Pen Needle 32G X 8 MM MISC Use as directed 100 each 0  . isosorbide mononitrate (IMDUR) 60 MG 24 hr tablet Take 1.5 tablets (90 mg total) by mouth daily. 135 tablet 0  . Lancets (FREESTYLE) lancets Use as instructed 100 each 0  . pantoprazole (PROTONIX) 40 MG tablet Take 1 tablet (40 mg total) by mouth daily before breakfast. 30 tablet 3  . potassium chloride SA (K-DUR,KLOR-CON) 20 MEQ tablet Take 2 tablets (40 mEq total) by mouth daily. 60 tablet 3  . torsemide (DEMADEX) 20 MG tablet Take 3 tablets (60 mg total) by mouth daily. 90 tablet 6   No current facility-administered medications on file prior to visit.      Past Medical History:  Diagnosis Date  . CHF (congestive heart failure) (HCC)     nonischemic cardiopathy  . Diabetes mellitus    adult-onset, type 2  . Exogenous obesity   . GERD (gastroesophageal reflux disease)   . Hypertension    No Known Allergies  Social History   Social History  . Marital status: Single    Spouse name: N/A  . Number of children: 6  . Years of education: 53   Occupational History  . 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 Smoking status: Never Smoker  . Smokeless tobacco: Never Used  . Alcohol use No  . Drug use: No  . Sexual activity: Yes    Birth  control/ protection: Condom   Other Topics Concern  . None   Social History Narrative  . None    Vitals:   12/20/16 1516  BP: 120/80  Pulse: 91  Resp: 12  O2 sat at RA 96% Body mass index is 36.55 kg/m.   Physical Exam  Nursing note and vitals reviewed. Constitutional: He is oriented to person, place, and time. He appears well-developed. No distress.  HENT:  Head: Atraumatic.  Mouth/Throat: Oropharynx is clear and moist and mucous membranes are normal.  Eyes: Conjunctivae and EOM are normal. Pupils are equal, round, and reactive to light.  Neck: No tracheal deviation present. Thyromegaly present. No thyroid mass present.  Cardiovascular: Normal rate and regular rhythm.   No murmur heard. Pulses:      Dorsalis pedis pulses are 2+ on the right side, and 2+ on the left side.  Respiratory: Effort normal and breath sounds normal. No respiratory distress.  GI: Soft. He exhibits no mass. There is no hepatomegaly. There is no tenderness.  Musculoskeletal: He exhibits edema (Trace pitting edema LE, bilateral.).  Left knee with mild effusion, no erythema. Limitation of full flexion, pain elicited.  Lymphadenopathy:    He has no cervical adenopathy.  Neurological: He is alert and oriented to person, place, and time. He has normal strength.  Antalgic gait, stable and assisted by a cane.  Skin: Skin is  warm. No erythema.  Hyperpigmentation, post inflammatory changes, on RUE s/p zoster infection.But also others with linear pattern on LUE, chest ,and back. No active ,erythematous rash.   Psychiatric: He has a normal mood and affect. Cognition and memory are normal.  Well groomed, good eye contact.    Diabetic foot exam:  Monofilament absent left foot (MTP and plantar aspect of toes" Peripheral pulses present (DP). + calluses + Long toenails.   ASSESSMENT AND PLAN:   Joshua Massey was seen today for 2-3 follow-up and the following were addressed:  Diagnoses and all orders for this visit:  Lab Results  Component Value Date   HGBA1C 9.7 (H) 12/20/2016   Lab Results  Component Value Date   CREATININE 2.63 (H) 12/20/2016   BUN 65 (H) 12/20/2016   NA 140 12/20/2016   K 4.8 12/20/2016   CL 103 12/20/2016   CO2 25 12/20/2016   Lab Results  Component Value Date   WBC 7.7 12/20/2016   HGB 12.7 (L) 12/20/2016   HCT 39.2 12/20/2016   MCV 87.1 12/20/2016   PLT 358 12/20/2016   Lab Results  Component Value Date   MICROALBUR 73.3 12/20/2016   Lab Results  Component Value Date   CHOL 135 12/20/2016   HDL 41 12/20/2016   LDLCALC 76 12/20/2016   TRIG 90 12/20/2016   CHOLHDL 3.3 12/20/2016    Type 2 diabetes mellitus with vascular disease ([place])  HgA1C was not at goal, pending HgA1C done today. No changes in current management but will consider a different agent depending of renal function. Regular exercise and healthy diet with avoidance of added sugar food intake is an important part of treatment and recommended. Annual eye exam, periodic dental and foot care recommended.He needs eye exam and planning on arranging it. F/U in 4 months  -     Fructosamine -     Hemoglobin A1c -     Microalbumin / creatinine urine ratio  Left knee pain, unspecified chronicity   -     diclofenac sodium (VOLTAREN) 1 % GEL; Apply 4 g topically  4 (four) times daily. -     VITAMIN  D 25 Hydroxy (Vit-D Deficiency, Fractures)  Essential hypertension  Adequately controlled. No changes in current management. DASH-low salt diet recommended. Eye exam needed. F/U in 4 months, before if needed.  -     Basic metabolic panel -     CBC with Differential/Platelet -     Lipid panel  CKD (chronic kidney disease), stage III  Educated about Dx. Avoid oral NSAID's. Diuretic treatment may aggravate problem.  Adequate hydration and low salt diet. He is not on ACEI or ARB. He takes Imdur and Hydroxyzine, to continue. Adequate HTN and DM controlled.  -     Basic metabolic panel -     Lipid panel  Postherpetic neuralgia  Gabapentin side effects discussed. He will increase Gabapentin from qd to bid. Fall precautions discussed. Topical Lidocaine was used initially and he would like to continue as needed.  -     Lidocaine 3.75 % CREA; Apply 1 application topically 3 (three) times daily as needed. -     gabapentin (NEURONTIN) 300 MG capsule; Take 1 capsule (300 mg total) by mouth 2 (two) times daily.  Diabetic peripheral neuropathy ([place])  Foot care discussed. Gabapentin to continue. May consider adding Cymbalta in the future. Adequate DM controlled.   Enlarged thyroid gland  Normal TSH in 07/2016, no nodules appreciated. Because I might consider adding GLP 1 for DM II (depending of renal function), [place] of thyroid gland will be arranged.  -     US THYROID; Future   -Joshua Massey was advised to return sooner than planned today if new concerns arise.     Tran Arzuaga G. [name], MD  [facility]. [place] office.

## 2016-12-20 NOTE — Patient Instructions (Signed)
A few things to remember from today's visit:   Type 2 diabetes mellitus with vascular disease ([place]) - Plan: Hemoglobin A1c, Fructosamine, Basic metabolic panel, Lipid panel  Essential hypertension - Plan: Basic metabolic panel  Left knee pain, unspecified chronicity - Plan: diclofenac sodium (VOLTAREN) 1 % GEL  CKD (chronic kidney disease), stage III - Plan: CBC, VITAMIN D 25 Hydroxy (Vit-D Deficiency, Fractures), Microalbumin / creatinine urine ratio  Postherpetic neuralgia - Plan: Lidocaine 3.75 % CREA, gabapentin (NEURONTIN) 300 MG capsule  Enlarged thyroid gland - Plan: US THYROID  HgA1C goal < 7.0. Avoid sugar added food:regular soft drinks, energy drinks, and sports drinks. candy. cakes. cookies. pies and cobblers. sweet rolls, pastries, and donuts. fruit drinks, such as fruitades and fruit punch. dairy desserts, such as ice cream  Mediterranean diet has showed benefits for sugar control.  How much and what type of carbohydrate foods are important for managing diabetes. The balance between how much insulin is in your body and the carbohydrate you eat makes a difference in your blood glucose levels.  Fasting blood sugar ideally 130 or less, 2 hours after meals less than 180.   Regular exercise also will help with controlling disease, daily brisk walking as tolerated for 15-30 min definitively will help.  Eye exam needed. Gabapentin increased to 2 times per day.    Avoid skipping meals, blood sugar might drop and cause serious problems. Remember checking feet periodically, good dental hygiene, and annual eye exam.     Please be sure medication list is accurate. If a new problem present, please set up appointment sooner than planned today.

## 2016-12-21 ENCOUNTER — Other Ambulatory Visit: Payer: Self-pay | Admitting: Family Medicine

## 2016-12-21 DIAGNOSIS — N183 Chronic kidney disease, stage 3 unspecified: Secondary | ICD-10-CM

## 2016-12-21 LAB — MICROALBUMIN / CREATININE URINE RATIO
Creatinine, Urine: 48 mg/dL (ref 20–370)
Microalb Creat Ratio: 1527 mcg/mg creat — ABNORMAL HIGH (ref <30)
Microalb, Ur: 73.3 mg/dL

## 2016-12-21 LAB — HEMOGLOBIN A1C
Hgb A1c MFr Bld: 9.7 % — ABNORMAL HIGH (ref <5.7)
Mean Plasma Glucose: 232 mg/dL

## 2016-12-21 LAB — VITAMIN D 25 HYDROXY (VIT D DEFICIENCY, FRACTURES): Vit D, 25-Hydroxy: 8 ng/mL — ABNORMAL LOW (ref 30–100)

## 2016-12-22 ENCOUNTER — Other Ambulatory Visit: Payer: Self-pay

## 2016-12-22 LAB — FRUCTOSAMINE: FRUCTOSAMINE: 294 umol/L — AB (ref 190–270)

## 2016-12-22 MED ORDER — INSULIN GLARGINE 100 UNIT/ML SOLOSTAR PEN: 20.0000 [IU] | PEN_INJECTOR | SUBCUTANEOUS | 0 refills | Status: DC

## 2016-12-22 MED ORDER — VITAMIN D (ERGOCALCIFEROL) 1.25 MG (50000 UNIT) PO CAPS: ORAL_CAPSULE | ORAL | 3 refills | Status: DC

## 2016-12-22 MED ORDER — ATORVASTATIN CALCIUM 10 MG PO TABS: 10.0000 mg | ORAL_TABLET | Freq: Every day | ORAL | 1 refills | Status: DC

## 2016-12-29 ENCOUNTER — Other Ambulatory Visit: Payer: Self-pay

## 2016-12-30 ENCOUNTER — Telehealth: Payer: Self-pay | Admitting: Family Medicine

## 2016-12-30 ENCOUNTER — Encounter (HOSPITAL_COMMUNITY): Payer: Self-pay | Admitting: Orthopedic Surgery

## 2016-12-30 NOTE — Telephone Encounter (Signed)
Which medication ?

## 2016-12-30 NOTE — Telephone Encounter (Signed)
° ° °  Pt call to say [facility] gave him the below med at 600 mg instead of 300 mg. Pt called to ask how he should take the medicine. Would like a call back    [phone]

## 2016-12-30 NOTE — Anesthesia Postprocedure Evaluation (Signed)
Anesthesia Post Note  Patient: Joshua Massey  Procedure(s) Performed: Procedure(s) (LRB): ARTHROSCOPY KNEE (Left)     Anesthesia Post Evaluation  Last Vitals:  Vitals:   09/02/16 0856 09/02/16 1342  BP: (!) 131/93 137/88  Pulse: 89   Resp:    Temp:  37 C    Last Pain:  Vitals:   09/02/16 1342  TempSrc: Oral  PainSc:                  Joshua Massey DAVID

## 2016-12-30 NOTE — Addendum Note (Signed)
Addendum  created 12/30/16 1706 by Lillia Abed, MD   Sign clinical note

## 2016-12-30 NOTE — Anesthesia Postprocedure Evaluation (Signed)
Anesthesia Post Note  Patient: Joshua Massey  Procedure(s) Performed: Procedure(s) (LRB): ARTHROSCOPY KNEE (Left)     Anesthesia Post Evaluation  Last Vitals:  Vitals:   09/02/16 0856 09/02/16 1342  BP: (!) 131/93 137/88  Pulse: 89   Resp:    Temp:  37 C    Last Pain:  Vitals:   09/02/16 1342  TempSrc: Oral  PainSc:                  Elonna Mcfarlane DAVID

## 2017-01-09 ENCOUNTER — Other Ambulatory Visit: Payer: Self-pay

## 2017-01-13 ENCOUNTER — Ambulatory Visit: Payer: Self-pay | Admitting: Endocrinology

## 2017-01-13 ENCOUNTER — Telehealth: Payer: Self-pay | Admitting: Endocrinology

## 2017-01-13 NOTE — Telephone Encounter (Signed)
Patient no showed today's appt. Please advise on how to follow up. °A. No follow up necessary. °B. Follow up urgent. Contact patient immediately. °C. Follow up necessary. Contact patient and schedule visit in ___ days. °D. Follow up advised. Contact patient and schedule visit in ____weeks. ° °

## 2017-01-13 NOTE — Telephone Encounter (Signed)
Please come back for a follow-up appointment in 2 months.    

## 2017-02-17 ENCOUNTER — Ambulatory Visit (HOSPITAL_COMMUNITY): Payer: BLUE CROSS/BLUE SHIELD | Attending: Family Medicine

## 2017-02-17 ENCOUNTER — Encounter (HOSPITAL_COMMUNITY): Payer: Self-pay | Admitting: Cardiology

## 2017-02-22 ENCOUNTER — Ambulatory Visit: Payer: Self-pay | Admitting: Endocrinology

## 2017-02-22 DIAGNOSIS — Z0289 Encounter for other administrative examinations: Secondary | ICD-10-CM

## 2017-03-09 ENCOUNTER — Encounter: Payer: Self-pay | Admitting: Family Medicine

## 2017-03-15 ENCOUNTER — Other Ambulatory Visit: Payer: Self-pay

## 2017-04-19 ENCOUNTER — Other Ambulatory Visit (HOSPITAL_COMMUNITY): Payer: Self-pay | Admitting: Internal Medicine

## 2017-04-21 ENCOUNTER — Other Ambulatory Visit: Payer: Self-pay | Admitting: Family Medicine

## 2017-04-21 ENCOUNTER — Telehealth: Payer: Self-pay | Admitting: Family Medicine

## 2017-04-21 ENCOUNTER — Other Ambulatory Visit: Payer: Self-pay | Admitting: *Deleted

## 2017-04-21 DIAGNOSIS — I10 Essential (primary) hypertension: Secondary | ICD-10-CM

## 2017-04-21 MED ORDER — TORSEMIDE 20 MG PO TABS: 60.0000 mg | ORAL_TABLET | Freq: Every day | ORAL | 0 refills | Status: DC

## 2017-04-21 NOTE — Telephone Encounter (Signed)
He has Hx of CHF with EF < 20%, diuretics are part of the treatment for this problems, he is on a high dose of Demadex, cardiology has been following closely. Also on his AVS after he saw Dr Gwenlyn Found he was instructed to call pharmacy if he needed medication refills. So today we can send a month supply [given the fact it is Friday] but for future refills he needs to contact his pharmacy to send request to cardiologist's office.  Thanks, Joshua Massey

## 2017-04-21 NOTE — Telephone Encounter (Signed)
Rx sent 

## 2017-04-21 NOTE — Telephone Encounter (Signed)
° °  Pt said his heart doctor told him that the below med should be filled by his PCP AND HE CALLED TODAY TO ASK IF YOU WILL REFILL THE BELOW MED    torsemide (DEMADEX) 20 MG tablet   Phamacy:  [facility]

## 2017-05-25 ENCOUNTER — Other Ambulatory Visit: Payer: Self-pay | Admitting: Family Medicine

## 2017-05-31 ENCOUNTER — Ambulatory Visit: Payer: Self-pay | Admitting: Family Medicine

## 2017-05-31 DIAGNOSIS — E876 Hypokalemia: Secondary | ICD-10-CM | POA: Insufficient documentation

## 2017-05-31 NOTE — Progress Notes (Deleted)
HPI:   [name] is a 41 y.o. male, who is here today for follow up. Missed 4 months follow-up appt.  He was last seen on December 20, 2016.  DM 2: He was supposed to follow with [name] in 02/2017, ***  Currently he is on Lantus 20 units daily and NovoLog sliding scale. Last eye exam: FG: *** Post prandial glucose: ***  Denies abdominal pain, nausea,vomiting, polydipsia,polyuria, or polyphagia.  Lab Results  Component Value Date   HGBA1C 9.7 (H) 12/20/2016   Lab Results  Component Value Date   MICROALBUR 73.3 12/20/2016    Hypertension:    Currently on Coreg 25 mg twice daily, Imdur 90 mg daily, and Hydralazine 100 mg 3 times per day.  + CKD III and CHF. He is on Demadex 60 mg daily. Follows with cardiology,Dr Gwenlyn Found.   ***taking medications as instructed, no side effects reported.  ***has not noted unusual headache, visual changes, exertional chest pain, dyspnea,  focal weakness, or edema.   Lab Results  Component Value Date   CREATININE 2.63 (H) 12/20/2016   BUN 65 (H) 12/20/2016   NA 140 12/20/2016   K 4.8 12/20/2016   CL 103 12/20/2016   CO2 25 12/20/2016   HypoK+, he is on K-DUR 20 meq 2 tabs daily.    Since *** last OV she has ***    Review of Systems  [review of 2 to 9 systems] ***  Current Outpatient Medications on File Prior to Visit  Medication Sig Dispense Refill  . acetaminophen (TYLENOL) 500 MG tablet Take 1,000 mg by mouth every 6 (six) hours as needed for moderate pain or headache.    Marland Kitchen aspirin 81 MG tablet Take 81 mg by mouth daily.    Marland Kitchen atorvastatin (LIPITOR) 10 MG tablet Take 1 tablet (10 mg total) by mouth daily with supper. 90 tablet 1  . carvedilol (COREG) 25 MG tablet Take 1 tablet (25 mg total) by mouth 2 (two) times daily with a meal. 180 tablet 1  . diclofenac sodium (VOLTAREN) 1 % GEL Apply 4 g topically 4 (four) times daily. 3 Tube 1  . ferrous sulfate 325 (65 FE) MG tablet Take 1 tablet (325 mg total) by  mouth 2 (two) times daily with a meal. 60 tablet 3  . gabapentin (NEURONTIN) 300 MG capsule Take 1 capsule (300 mg total) by mouth 2 (two) times daily. 60 capsule 3  . glucose blood (FREESTYLE TEST STRIPS) test strip Use as instructed 100 each 0  . glucose monitoring kit (FREESTYLE) monitoring kit 1 each by Does not apply route 4 (four) times daily - after meals and at bedtime. 1 month Diabetic Testing Supplies for QAC-QHS accuchecks. 1 each 1  . hydrALAZINE (APRESOLINE) 100 MG tablet Take 1 tablet (100 mg total) by mouth 3 (three) times daily. 270 tablet 3  . insulin aspart (NOVOLOG FLEXPEN) 100 UNIT/ML FlexPen 0-15 Units, Subcutaneous, 3 times daily with meals CBG < 70: implement hypoglycemia protocol-call MD CBG 70 - 120: 0 units CBG 121 - 150: 2 units CBG 151 - 200: 3 units CBG 201 - 250: 5 units CBG 251 - 300: 8 units CBG 301 - 350: 11 units CBG 351 - 400: 15 units CBG > 400:Call MD 15 mL 0  . Insulin Glargine (LANTUS SOLOSTAR) 100 UNIT/ML Solostar Pen Inject 20 Units into the skin every morning. And pen needles 1/day 5 pen 0  . Insulin Pen Needle 32G X 8 MM  MISC Use as directed 100 each 0  . isosorbide mononitrate (IMDUR) 60 MG 24 hr tablet TAKE 1 & 1/2 (ONE & ONE-HALF) TABLETS BY MOUTH ONCE DAILY 135 tablet 0  . Lancets (FREESTYLE) lancets Use as instructed 100 each 0  . Lidocaine 3.75 % CREA Apply 1 application topically 3 (three) times daily as needed. 60 g 1  . pantoprazole (PROTONIX) 40 MG tablet Take 1 tablet (40 mg total) by mouth daily before breakfast. 30 tablet 3  . potassium chloride SA (K-DUR,KLOR-CON) 20 MEQ tablet Take 2 tablets (40 mEq total) by mouth daily. 60 tablet 3  . torsemide (DEMADEX) 20 MG tablet TAKE 3 TABLETS BY MOUTH ONCE DAILY 90 tablet 0  . Vitamin D, Ergocalciferol, (DRISDOL) 50000 units CAPS capsule Take 1 capsule by mouth once weekly for 16 weeks, and then every 2 weeks. 4 capsule 3   No current facility-administered medications on file prior to visit.        Past Medical History:  Diagnosis Date  . 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 Diabetes mellitus    adult-onset, type 2  . Exogenous obesity   . GERD (gastroesophageal reflux disease)   . Hypertension    No Known Allergies  Social History   Socioeconomic History  . Marital status: Single    Spouse name: Not on file  . Number of children: 6  . Years of education: 12  . Highest education level: Not on file  Social Needs  . Financial resource strain: Not on file  . Food insecurity - worry: Not on file  . Food insecurity - inability: Not on file  . Transportation needs - medical: Not on file  . Transportation needs - non-medical: Not on file  Occupational History  . Occupation: meter checker    Comment: [employer]  . Occupation: newpaper delivery    Employer: [employer]  Tobacco Use  . Smoking status: Never Smoker  . Smokeless tobacco: Never Used  Substance and Sexual Activity  . Alcohol use: No  . Drug use: No  . Sexual activity: Yes    Birth control/protection: Condom  Other Topics Concern  . Not on file  Social History Narrative  . Not on file    There were no vitals filed for this visit. There is no height or weight on file to calculate BMI.      Physical Exam  [12+ exam elements] ***  ASSESSMENT AND PLAN:   [name] was seen today for *** months follow-up.   There are no diagnoses linked to this encounter.         -[name] was advised to return sooner than planned today if new concerns arise.       [name]. Jordan, MD  [facility]. [place] office.                 

## 2017-06-23 ENCOUNTER — Other Ambulatory Visit: Payer: Self-pay | Admitting: Family Medicine

## 2017-07-08 ENCOUNTER — Other Ambulatory Visit (HOSPITAL_COMMUNITY): Payer: Self-pay | Admitting: Adult Health

## 2017-07-18 ENCOUNTER — Other Ambulatory Visit: Payer: Self-pay | Admitting: Family Medicine

## 2017-07-18 NOTE — Telephone Encounter (Signed)
Called patient and left message to return call. Patient is due for follow-up and needs to schedule appointment.

## 2017-07-20 ENCOUNTER — Other Ambulatory Visit: Payer: Self-pay | Admitting: *Deleted

## 2017-07-20 NOTE — Telephone Encounter (Signed)
Spoke with patient, patient scheduled appointment for Monday at 3 pm. Rx sent to pharmacy.

## 2017-07-23 NOTE — Progress Notes (Deleted)
HPI:   [name] is a 42 y.o. male, who is here today for 6 months follow up.   He was last seen on 12/20/16.  DM II: He is supposed to be following with Joshua Joshua Massey.  Currently he is on  FG: *** Post prandial: ***  Last eye exam: ***  Hypoglycemia: ***  Lab Results  Component Value Date   HGBA1C 9.7 (H) 12/20/2016   Lab Results  Component Value Date   MICROALBUR 73.3 12/20/2016    HTN: He is currently on Coreg 25 mg bid,Imdur 90 mg daily, and Hydralazine 100 mg tid.  CHF with LVEF 25-30% (echo 07/2016), he follows with Joshua Massey,cardiologist. Last f/u visit in ***   Lab Results  Component Value Date   CREATININE 2.63 (H) 12/20/2016   BUN 65 (H) 12/20/2016   NA 140 12/20/2016   K 4.8 12/20/2016   CL 103 12/20/2016   CO2 25 12/20/2016   CKD III: He has not noted gross hematuria,foam in urine,or decreased urine output.    HypoK+,on K-DUR 20 meq bid.  Vit D deficiency, 25 OH vit D 12/20/16 was low at 8.  He is on Ergocalciferol 50,000 U ***  {4+ HPI elements (or status of 3 or more chronic diseases)} ***      Review of Systems  [review of 2 to 9 systems] ***  Current Outpatient Medications on File Prior to Visit  Medication Sig Dispense Refill  . acetaminophen (TYLENOL) 500 MG tablet Take 1,000 mg by mouth every 6 (six) hours as needed for moderate pain or headache.    Marland Kitchen aspirin 81 MG tablet Take 81 mg by mouth daily.    Marland Kitchen atorvastatin (LIPITOR) 10 MG tablet Take 1 tablet (10 mg total) by mouth daily with supper. 90 tablet 1  . carvedilol (COREG) 25 MG tablet Take 1 tablet (25 mg total) by mouth 2 (two) times daily with a meal. 180 tablet 1  . diclofenac sodium (VOLTAREN) 1 % GEL Apply 4 g topically 4 (four) times daily. 3 Tube 1  . ferrous sulfate 325 (65 FE) MG tablet Take 1 tablet (325 mg total) by mouth 2 (two) times daily with a meal. 60 tablet 3  . gabapentin (NEURONTIN) 300 MG capsule Take 1 capsule (300 mg total) by mouth 2 (two)  times daily. 60 capsule 3  . glucose blood (FREESTYLE TEST STRIPS) test strip Use as instructed 100 each 0  . glucose monitoring kit (FREESTYLE) monitoring kit 1 each by Does not apply route 4 (four) times daily - after meals and at bedtime. 1 month Diabetic Testing Supplies for QAC-QHS accuchecks. 1 each 1  . hydrALAZINE (APRESOLINE) 100 MG tablet Take 1 tablet (100 mg total) by mouth 3 (three) times daily. 270 tablet 3  . insulin aspart (NOVOLOG FLEXPEN) 100 UNIT/ML FlexPen 0-15 Units, Subcutaneous, 3 times daily with meals CBG < 70: implement hypoglycemia protocol-call MD CBG 70 - 120: 0 units CBG 121 - 150: 2 units CBG 151 - 200: 3 units CBG 201 - 250: 5 units CBG 251 - 300: 8 units CBG 301 - 350: 11 units CBG 351 - 400: 15 units CBG > 400:Call MD 15 mL 0  . Insulin Glargine (LANTUS SOLOSTAR) 100 UNIT/ML Solostar Pen Inject 20 Units into the skin every morning. And pen needles 1/day 5 pen 0  . Insulin Pen Needle 32G X 8 MM MISC Use as directed 100 each 0  . isosorbide mononitrate (IMDUR) 60  MG 24 hr tablet TAKE 1 & 1/2 (ONE & ONE-HALF) TABLETS BY MOUTH ONCE DAILY 135 tablet 0  . KLOR-CON M20 20 MEQ tablet TAKE 2 TABLETS BY MOUTH ONCE DAILY 60 tablet 3  . Lancets (FREESTYLE) lancets Use as instructed 100 each 0  . Lidocaine 3.75 % CREA Apply 1 application topically 3 (three) times daily as needed. 60 g 1  . pantoprazole (PROTONIX) 40 MG tablet Take 1 tablet (40 mg total) by mouth daily before breakfast. 30 tablet 3  . torsemide (DEMADEX) 20 MG tablet TAKE 3 TABLETS BY MOUTH ONCE DAILY 90 tablet 0  . Vitamin D, Ergocalciferol, (DRISDOL) 50000 units CAPS capsule Take 1 capsule by mouth once weekly for 16 weeks, and then every 2 weeks. 4 capsule 3   No current facility-administered medications on file prior to visit.      Past Medical History:  Diagnosis Date  . 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 Diabetes mellitus    adult-onset, type 2  . Exogenous  obesity   . GERD (gastroesophageal reflux disease)   . Hypertension    No Known Allergies  Social History   Socioeconomic History  . Marital status: Single    Spouse name: Not on file  . Number of children: 6  . Years of education: 53  . Highest education level: Not on file  Social Needs  . Financial resource strain: Not on file  . Food insecurity - worry: Not on file  . Food insecurity - inability: Not on file  . Transportation needs - medical: Not on file  . Transportation needs - non-medical: Not on file  Occupational History  . Occupation: Brewing technologist    Comment: [employer]  . 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 Smoking status: Never Smoker  . Smokeless tobacco: Never Used  Substance and Sexual Activity  . Alcohol use: No  . Drug use: No  . Sexual activity: Yes    Birth control/protection: Condom  Other Topics Concern  . Not on file  Social History Narrative  . Not on file    There were no vitals filed for this visit. There is no height or weight on file to calculate BMI.      Physical Exam  {[12+ exam elements]} ***  ASSESSMENT AND PLAN:   [name] was seen today for *** months follow-up.  No orders of the defined types were placed in this encounter.   No problem-specific Assessment & Plan notes found for this encounter.           -[name] was advised to return sooner than planned today if new concerns arise.       Joshua Trentman G. [name], MD  [facility]. [place] office.

## 2017-07-24 ENCOUNTER — Ambulatory Visit: Payer: Self-pay | Admitting: Family Medicine

## 2017-07-24 DIAGNOSIS — Z0289 Encounter for other administrative examinations: Secondary | ICD-10-CM

## 2017-08-18 ENCOUNTER — Other Ambulatory Visit: Payer: Self-pay | Admitting: Family Medicine

## 2017-08-18 DIAGNOSIS — I10 Essential (primary) hypertension: Secondary | ICD-10-CM

## 2017-08-24 NOTE — Progress Notes (Deleted)
HPI:   [name] is a 42 y.o. male, who is here today for *** months follow up.   He was last seen on 12/20/2016.  Diabetes Mellitus II:    Currently on Lantus 20 U daily. Checking BS's : *** Hypoglycemia:***  *** is tolerating medications well. *** denies abdominal pain, nausea, vomiting, polydipsia, polyuria, or polyphagia. ***numbness, tingling, or burning.  Peripheral neuropathy ***   Lab Results  Component Value Date   HGBA1C 9.7 (H) 12/20/2016   Lab Results  Component Value Date   MICROALBUR 73.3 12/20/2016   Hypertension:   Currently on Coreg 25 mg bid,Imdur 60 mg daily, and Hydralazine 100 mg tid.  Home BP readings: *** Last eye exam: ***  ***taking medications as instructed, no side effects reported.  ***has not noted unusual headache, visual changes, exertional chest pain, dyspnea,  focal weakness, or edema.  CHF LVEF 25-30%. On Domadex 60 mg daily. He follows with Dr Gwenlyn Found  HypoK+ on K-DUR 20 meq bid.  Lab Results  Component Value Date   CREATININE 2.63 (H) 12/20/2016   BUN 65 (H) 12/20/2016   NA 140 12/20/2016   K 4.8 12/20/2016   CL 103 12/20/2016   CO2 25 12/20/2016    CKD III ***  Vit D deficiency , he is on ***   {4+ HPI elements (or status of 3 or more chronic diseases)} *** Anemia: Currently she is on Fe Sulfate 325 mg bid.    Lab Results  Component Value Date   WBC 7.7 12/20/2016   HGB 12.7 (L) 12/20/2016   HCT 39.2 12/20/2016   MCV 87.1 12/20/2016   PLT 358 12/20/2016        Review of Systems  [review of 2 to 9 systems] ***  Current Outpatient Medications on File Prior to Visit  Medication Sig Dispense Refill  . acetaminophen (TYLENOL) 500 MG tablet Take 1,000 mg by mouth every 6 (six) hours as needed for moderate pain or headache.    Marland Kitchen aspirin 81 MG tablet Take 81 mg by mouth daily.    Marland Kitchen atorvastatin (LIPITOR) 10 MG tablet Take 1 tablet (10 mg total) by mouth daily with supper. 90 tablet 1    . carvedilol (COREG) 25 MG tablet TAKE 1 TABLET BY MOUTH TWICE DAILY WITH  A  MEAL 180 tablet 1  . diclofenac sodium (VOLTAREN) 1 % GEL Apply 4 g topically 4 (four) times daily. 3 Tube 1  . ferrous sulfate 325 (65 FE) MG tablet Take 1 tablet (325 mg total) by mouth 2 (two) times daily with a meal. 60 tablet 3  . gabapentin (NEURONTIN) 300 MG capsule Take 1 capsule (300 mg total) by mouth 2 (two) times daily. 60 capsule 3  . glucose blood (FREESTYLE TEST STRIPS) test strip Use as instructed 100 each 0  . glucose monitoring kit (FREESTYLE) monitoring kit 1 each by Does not apply route 4 (four) times daily - after meals and at bedtime. 1 month Diabetic Testing Supplies for QAC-QHS accuchecks. 1 each 1  . hydrALAZINE (APRESOLINE) 100 MG tablet Take 1 tablet (100 mg total) by mouth 3 (three) times daily. 270 tablet 3  . insulin aspart (NOVOLOG FLEXPEN) 100 UNIT/ML FlexPen 0-15 Units, Subcutaneous, 3 times daily with meals CBG < 70: implement hypoglycemia protocol-call MD CBG 70 - 120: 0 units CBG 121 - 150: 2 units CBG 151 - 200: 3 units CBG 201 - 250: 5 units CBG 251 - 300: 8 units  CBG 301 - 350: 11 units CBG 351 - 400: 15 units CBG > 400:Call MD 15 mL 0  . Insulin Glargine (LANTUS SOLOSTAR) 100 UNIT/ML Solostar Pen Inject 20 Units into the skin every morning. And pen needles 1/day 5 pen 0  . Insulin Pen Needle 32G X 8 MM MISC Use as directed 100 each 0  . isosorbide mononitrate (IMDUR) 60 MG 24 hr tablet TAKE 1 & 1/2 (ONE & ONE-HALF) TABLETS BY MOUTH ONCE DAILY 135 tablet 0  . KLOR-CON M20 20 MEQ tablet TAKE 2 TABLETS BY MOUTH ONCE DAILY 60 tablet 3  . Lancets (FREESTYLE) lancets Use as instructed 100 each 0  . Lidocaine 3.75 % CREA Apply 1 application topically 3 (three) times daily as needed. 60 g 1  . pantoprazole (PROTONIX) 40 MG tablet Take 1 tablet (40 mg total) by mouth daily before breakfast. 30 tablet 3  . torsemide (DEMADEX) 20 MG tablet TAKE 3 TABLETS BY MOUTH ONCE DAILY 90 tablet 0   . Vitamin D, Ergocalciferol, (DRISDOL) 50000 units CAPS capsule Take 1 capsule by mouth once weekly for 16 weeks, and then every 2 weeks. 4 capsule 3   No current facility-administered medications on file prior to visit.      Past Medical History:  Diagnosis Date  . 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 Diabetes mellitus    adult-onset, type 2  . Exogenous obesity   . GERD (gastroesophageal reflux disease)   . Hypertension    No Known Allergies  Social History   Socioeconomic History  . Marital status: Single    Spouse name: Not on file  . Number of children: 6  . Years of education: 51  . Highest education level: Not on file  Social Needs  . Financial resource strain: Not on file  . Food insecurity - worry: Not on file  . Food insecurity - inability: Not on file  . Transportation needs - medical: Not on file  . Transportation needs - non-medical: Not on file  Occupational History  . Occupation: Brewing technologist    Comment: [name]  . Occupation: newpaper delivery    Employer: [employer]  Tobacco Use  . Smoking status: Never Smoker  . Smokeless tobacco: Never Used  Substance and Sexual Activity  . Alcohol use: No  . Drug use: No  . Sexual activity: Yes    Birth control/protection: Condom  Other Topics Concern  . Not on file  Social History Narrative  . Not on file    There were no vitals filed for this visit. There is no height or weight on file to calculate BMI.      Physical Exam  {[12+ exam elements]} ***  ASSESSMENT AND PLAN:   Joshua Massey was seen today for *** months follow-up.  No orders of the defined types were placed in this encounter.   No problem-specific Assessment & Plan notes found for this encounter.           -Mr. ALLYN BERTONI was advised to return sooner than planned today if new concerns arise.       Joshua G. [name], MD  [facility]. [place] office.

## 2017-08-25 ENCOUNTER — Ambulatory Visit: Payer: Self-pay | Admitting: Family Medicine

## 2017-08-25 DIAGNOSIS — Z0289 Encounter for other administrative examinations: Secondary | ICD-10-CM

## 2017-09-15 ENCOUNTER — Other Ambulatory Visit: Payer: Self-pay | Admitting: Family Medicine

## 2017-09-18 ENCOUNTER — Other Ambulatory Visit: Payer: Self-pay | Admitting: Family Medicine

## 2017-09-21 NOTE — Progress Notes (Deleted)
HPI:   [name] is a 42 y.o. male, who is here today for *** months follow up.   He was last seen on 12/20/2016.  Diabetes Mellitus II:    Currently on Lantus 20 U daily. Checking BS's : *** Hypoglycemia:***  *** is tolerating medications well. *** denies abdominal pain, nausea, vomiting, polydipsia, polyuria, or polyphagia. ***numbness, tingling, or burning.  Peripheral neuropathy ***   Lab Results  Component Value Date   HGBA1C 9.7 (H) 12/20/2016   Lab Results  Component Value Date   MICROALBUR 73.3 12/20/2016   Hypertension:   Currently on Coreg 25 mg bid,Imdur 60 mg daily, and Hydralazine 100 mg tid.  Home BP readings: *** Last eye exam: ***  ***taking medications as instructed, no side effects reported.  ***has not noted unusual headache, visual changes, exertional chest pain, dyspnea,  focal weakness, or edema.  CHF LVEF 25-30%. On Domadex 60 mg daily. He follows with Dr Joshua Massey  HypoK+ on K-DUR 20 meq bid.  Lab Results  Component Value Date   CREATININE 2.63 (H) 12/20/2016   BUN 65 (H) 12/20/2016   NA 140 12/20/2016   K 4.8 12/20/2016   CL 103 12/20/2016   CO2 25 12/20/2016    CKD III ***  Vit D deficiency , he is on ***   {4+ HPI elements (or status of 3 or more chronic diseases)} *** Anemia: Currently she is on Fe Sulfate 325 mg bid.    Lab Results  Component Value Date   WBC 7.7 12/20/2016   HGB 12.7 (L) 12/20/2016   HCT 39.2 12/20/2016   MCV 87.1 12/20/2016   PLT 358 12/20/2016        Review of Systems  [review of 2 to 9 systems] ***  Current Outpatient Medications on File Prior to Visit  Medication Sig Dispense Refill  . acetaminophen (TYLENOL) 500 MG tablet Take 1,000 mg by mouth every 6 (six) hours as needed for moderate pain or headache.    Marland Kitchen aspirin 81 MG tablet Take 81 mg by mouth daily.    Marland Kitchen atorvastatin (LIPITOR) 10 MG tablet Take 1 tablet (10 mg total) by mouth daily with supper. 90 tablet 1    . carvedilol (COREG) 25 MG tablet TAKE 1 TABLET BY MOUTH TWICE DAILY WITH  A  MEAL 180 tablet 1  . diclofenac sodium (VOLTAREN) 1 % GEL Apply 4 g topically 4 (four) times daily. 3 Tube 1  . ferrous sulfate 325 (65 FE) MG tablet Take 1 tablet (325 mg total) by mouth 2 (two) times daily with a meal. 60 tablet 3  . gabapentin (NEURONTIN) 300 MG capsule Take 1 capsule (300 mg total) by mouth 2 (two) times daily. 60 capsule 3  . glucose blood (FREESTYLE TEST STRIPS) test strip Use as instructed 100 each 0  . glucose monitoring kit (FREESTYLE) monitoring kit 1 each by Does not apply route 4 (four) times daily - after meals and at bedtime. 1 month Diabetic Testing Supplies for QAC-QHS accuchecks. 1 each 1  . hydrALAZINE (APRESOLINE) 100 MG tablet Take 1 tablet (100 mg total) by mouth 3 (three) times daily. 270 tablet 3  . insulin aspart (NOVOLOG FLEXPEN) 100 UNIT/ML FlexPen 0-15 Units, Subcutaneous, 3 times daily with meals CBG < 70: implement hypoglycemia protocol-call MD CBG 70 - 120: 0 units CBG 121 - 150: 2 units CBG 151 - 200: 3 units CBG 201 - 250: 5 units CBG 251 - 300: 8 units  CBG 301 - 350: 11 units CBG 351 - 400: 15 units CBG > 400:Call MD 15 mL 0  . Insulin Glargine (LANTUS SOLOSTAR) 100 UNIT/ML Solostar Pen Inject 20 Units into the skin every morning. And pen needles 1/day 5 pen 0  . Insulin Pen Needle 32G X 8 MM MISC Use as directed 100 each 0  . isosorbide mononitrate (IMDUR) 60 MG 24 hr tablet TAKE 1 & 1/2 (ONE & ONE-HALF) TABLETS BY MOUTH ONCE DAILY 135 tablet 0  . KLOR-CON M20 20 MEQ tablet TAKE 2 TABLETS BY MOUTH ONCE DAILY 60 tablet 3  . Lancets (FREESTYLE) lancets Use as instructed 100 each 0  . Lidocaine 3.75 % CREA Apply 1 application topically 3 (three) times daily as needed. 60 g 1  . pantoprazole (PROTONIX) 40 MG tablet Take 1 tablet (40 mg total) by mouth daily before breakfast. 30 tablet 3  . torsemide (DEMADEX) 20 MG tablet TAKE 3 TABLETS BY MOUTH ONCE DAILY 180 tablet  2  . Vitamin D, Ergocalciferol, (DRISDOL) 50000 units CAPS capsule Take 1 capsule by mouth once weekly for 16 weeks, and then every 2 weeks. 4 capsule 3   No current facility-administered medications on file prior to visit.      Past Medical History:  Diagnosis Date  . 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 Diabetes mellitus    adult-onset, type 2  . Exogenous obesity   . GERD (gastroesophageal reflux disease)   . Hypertension    No Known Allergies  Social History   Socioeconomic History  . Marital status: Single    Spouse name: Not on file  . Number of children: 6  . Years of education: 37  . Highest education level: Not on file  Occupational History  . Occupation: Brewing technologist    Comment: [name]  . Occupation: newpaper delivery    Employer: [employer]  Social Needs  . Financial resource strain: Not on file  . Food insecurity:    Worry: Not on file    Inability: Not on file  . Transportation needs:    Medical: Not on file    Non-medical: Not on file  Tobacco Use  . Smoking status: Never Smoker  . Smokeless tobacco: Never Used  Substance and Sexual Activity  . Alcohol use: No  . Drug use: No  . Sexual activity: Yes    Birth control/protection: Condom  Lifestyle  . Physical activity:    Days per week: Not on file    Minutes per session: Not on file  . Stress: Not on file  Relationships  . Social connections:    Talks on phone: Not on file    Gets together: Not on file    Attends religious service: Not on file    Active member of club or organization: Not on file    Attends meetings of clubs or organizations: Not on file    Relationship status: Not on file  Other Topics Concern  . Not on file  Social History Narrative  . Not on file    There were no vitals filed for this visit. There is no height or weight on file to calculate BMI.      Physical Exam  {[12+ exam elements]} ***  ASSESSMENT  AND PLAN:   [name] was seen today for *** months follow-up.  No orders of the defined types were placed in this encounter.   No  problem-specific Assessment & Plan notes Massey for this encounter.           -[name] was advised to return sooner than planned today if new concerns arise.       Maeson Purohit G. [name], MD  [facility]. [place] office.

## 2017-09-22 ENCOUNTER — Ambulatory Visit: Payer: Self-pay | Admitting: Family Medicine

## 2017-09-22 DIAGNOSIS — Z0289 Encounter for other administrative examinations: Secondary | ICD-10-CM

## 2017-11-27 ENCOUNTER — Encounter (HOSPITAL_COMMUNITY): Payer: Self-pay

## 2017-11-27 ENCOUNTER — Inpatient Hospital Stay (HOSPITAL_COMMUNITY)
Admission: EM | Admit: 2017-11-27 | Discharge: 2017-12-07 | DRG: 291 | Disposition: A | Discharge status: Home / Self Care | Payer: BLUE CROSS/BLUE SHIELD | Attending: Family Medicine | Admitting: Family Medicine

## 2017-11-27 ENCOUNTER — Emergency Department (HOSPITAL_COMMUNITY): Payer: BLUE CROSS/BLUE SHIELD

## 2017-11-27 ENCOUNTER — Emergency Department (HOSPITAL_COMMUNITY)
Admit: 2017-11-27 | Discharge: 2017-11-27 | Disposition: A | Discharge status: Home / Self Care | Payer: BLUE CROSS/BLUE SHIELD | Attending: Emergency Medicine | Admitting: Emergency Medicine

## 2017-11-27 DIAGNOSIS — L97919 Non-pressure chronic ulcer of unspecified part of right lower leg with unspecified severity: Secondary | ICD-10-CM | POA: Diagnosis not present

## 2017-11-27 DIAGNOSIS — I445 Left posterior fascicular block: Secondary | ICD-10-CM | POA: Diagnosis present

## 2017-11-27 DIAGNOSIS — R600 Localized edema: Secondary | ICD-10-CM | POA: Diagnosis present

## 2017-11-27 DIAGNOSIS — L97509 Non-pressure chronic ulcer of other part of unspecified foot with unspecified severity: Secondary | ICD-10-CM

## 2017-11-27 DIAGNOSIS — I5023 Acute on chronic systolic (congestive) heart failure: Secondary | ICD-10-CM | POA: Diagnosis present

## 2017-11-27 DIAGNOSIS — L97929 Non-pressure chronic ulcer of unspecified part of left lower leg with unspecified severity: Secondary | ICD-10-CM | POA: Diagnosis present

## 2017-11-27 DIAGNOSIS — I471 Supraventricular tachycardia: Secondary | ICD-10-CM | POA: Diagnosis not present

## 2017-11-27 DIAGNOSIS — E11621 Type 2 diabetes mellitus with foot ulcer: Secondary | ICD-10-CM | POA: Diagnosis not present

## 2017-11-27 DIAGNOSIS — E1121 Type 2 diabetes mellitus with diabetic nephropathy: Secondary | ICD-10-CM | POA: Diagnosis present

## 2017-11-27 DIAGNOSIS — N185 Chronic kidney disease, stage 5: Secondary | ICD-10-CM | POA: Diagnosis present

## 2017-11-27 DIAGNOSIS — E559 Vitamin D deficiency, unspecified: Secondary | ICD-10-CM

## 2017-11-27 DIAGNOSIS — E1159 Type 2 diabetes mellitus with other circulatory complications: Secondary | ICD-10-CM | POA: Diagnosis present

## 2017-11-27 DIAGNOSIS — Z951 Presence of aortocoronary bypass graft: Secondary | ICD-10-CM

## 2017-11-27 DIAGNOSIS — N184 Chronic kidney disease, stage 4 (severe): Secondary | ICD-10-CM | POA: Diagnosis present

## 2017-11-27 DIAGNOSIS — I872 Venous insufficiency (chronic) (peripheral): Secondary | ICD-10-CM | POA: Diagnosis present

## 2017-11-27 DIAGNOSIS — I110 Hypertensive heart disease with heart failure: Secondary | ICD-10-CM | POA: Diagnosis present

## 2017-11-27 DIAGNOSIS — Z79899 Other long term (current) drug therapy: Secondary | ICD-10-CM

## 2017-11-27 DIAGNOSIS — I5082 Biventricular heart failure: Secondary | ICD-10-CM | POA: Diagnosis present

## 2017-11-27 DIAGNOSIS — Z6841 Body Mass Index (BMI) 40.0 and over, adult: Secondary | ICD-10-CM | POA: Diagnosis not present

## 2017-11-27 DIAGNOSIS — I2510 Atherosclerotic heart disease of native coronary artery without angina pectoris: Secondary | ICD-10-CM | POA: Diagnosis present

## 2017-11-27 DIAGNOSIS — I87333 Chronic venous hypertension (idiopathic) with ulcer and inflammation of bilateral lower extremity: Secondary | ICD-10-CM | POA: Diagnosis present

## 2017-11-27 DIAGNOSIS — I428 Other cardiomyopathies: Secondary | ICD-10-CM | POA: Diagnosis present

## 2017-11-27 DIAGNOSIS — N183 Chronic kidney disease, stage 3 unspecified: Secondary | ICD-10-CM | POA: Diagnosis present

## 2017-11-27 DIAGNOSIS — L03115 Cellulitis of right lower limb: Secondary | ICD-10-CM | POA: Diagnosis present

## 2017-11-27 DIAGNOSIS — L97519 Non-pressure chronic ulcer of other part of right foot with unspecified severity: Secondary | ICD-10-CM | POA: Diagnosis not present

## 2017-11-27 DIAGNOSIS — I4892 Unspecified atrial flutter: Secondary | ICD-10-CM | POA: Diagnosis not present

## 2017-11-27 DIAGNOSIS — J9601 Acute respiratory failure with hypoxia: Secondary | ICD-10-CM | POA: Diagnosis present

## 2017-11-27 DIAGNOSIS — L03119 Cellulitis of unspecified part of limb: Secondary | ICD-10-CM

## 2017-11-27 DIAGNOSIS — E1165 Type 2 diabetes mellitus with hyperglycemia: Secondary | ICD-10-CM | POA: Diagnosis present

## 2017-11-27 DIAGNOSIS — E13621 Other specified diabetes mellitus with foot ulcer: Secondary | ICD-10-CM

## 2017-11-27 DIAGNOSIS — R609 Edema, unspecified: Secondary | ICD-10-CM

## 2017-11-27 DIAGNOSIS — I10 Essential (primary) hypertension: Secondary | ICD-10-CM | POA: Diagnosis present

## 2017-11-27 DIAGNOSIS — I5021 Acute systolic (congestive) heart failure: Secondary | ICD-10-CM | POA: Diagnosis not present

## 2017-11-27 DIAGNOSIS — K769 Liver disease, unspecified: Secondary | ICD-10-CM | POA: Diagnosis not present

## 2017-11-27 DIAGNOSIS — N179 Acute kidney failure, unspecified: Secondary | ICD-10-CM | POA: Diagnosis present

## 2017-11-27 DIAGNOSIS — E8779 Other fluid overload: Secondary | ICD-10-CM | POA: Diagnosis not present

## 2017-11-27 DIAGNOSIS — E11628 Type 2 diabetes mellitus with other skin complications: Secondary | ICD-10-CM

## 2017-11-27 DIAGNOSIS — I132 Hypertensive heart and chronic kidney disease with heart failure and with stage 5 chronic kidney disease, or end stage renal disease: Secondary | ICD-10-CM | POA: Diagnosis present

## 2017-11-27 DIAGNOSIS — E1122 Type 2 diabetes mellitus with diabetic chronic kidney disease: Secondary | ICD-10-CM | POA: Diagnosis present

## 2017-11-27 DIAGNOSIS — Z9119 Patient's noncompliance with other medical treatment and regimen: Secondary | ICD-10-CM

## 2017-11-27 DIAGNOSIS — I340 Nonrheumatic mitral (valve) insufficiency: Secondary | ICD-10-CM | POA: Diagnosis not present

## 2017-11-27 DIAGNOSIS — I509 Heart failure, unspecified: Secondary | ICD-10-CM | POA: Diagnosis not present

## 2017-11-27 DIAGNOSIS — E6609 Other obesity due to excess calories: Secondary | ICD-10-CM

## 2017-11-27 DIAGNOSIS — E876 Hypokalemia: Secondary | ICD-10-CM | POA: Diagnosis not present

## 2017-11-27 DIAGNOSIS — Z9114 Patient's other noncompliance with medication regimen: Secondary | ICD-10-CM

## 2017-11-27 DIAGNOSIS — L03031 Cellulitis of right toe: Secondary | ICD-10-CM | POA: Diagnosis present

## 2017-11-27 DIAGNOSIS — L0390 Cellulitis, unspecified: Secondary | ICD-10-CM

## 2017-11-27 DIAGNOSIS — E8770 Fluid overload, unspecified: Secondary | ICD-10-CM

## 2017-11-27 DIAGNOSIS — Z794 Long term (current) use of insulin: Secondary | ICD-10-CM

## 2017-11-27 DIAGNOSIS — K219 Gastro-esophageal reflux disease without esophagitis: Secondary | ICD-10-CM | POA: Diagnosis present

## 2017-11-27 DIAGNOSIS — E669 Obesity, unspecified: Secondary | ICD-10-CM | POA: Diagnosis present

## 2017-11-27 DIAGNOSIS — I361 Nonrheumatic tricuspid (valve) insufficiency: Secondary | ICD-10-CM | POA: Diagnosis not present

## 2017-11-27 DIAGNOSIS — I483 Typical atrial flutter: Secondary | ICD-10-CM | POA: Diagnosis not present

## 2017-11-27 DIAGNOSIS — N5089 Other specified disorders of the male genital organs: Secondary | ICD-10-CM | POA: Diagnosis present

## 2017-11-27 DIAGNOSIS — Z7982 Long term (current) use of aspirin: Secondary | ICD-10-CM

## 2017-11-27 LAB — CBC WITH DIFFERENTIAL/PLATELET
BASOS PCT: 0 %
Basophils Absolute: 0 10*3/uL (ref 0.0–0.1)
EOS ABS: 0.1 10*3/uL (ref 0.0–0.7)
Eosinophils Relative: 1 %
HEMATOCRIT: 35.5 % — AB (ref 39.0–52.0)
HEMOGLOBIN: 11.2 g/dL — AB (ref 13.0–17.0)
LYMPHS PCT: 4 %
Lymphs Abs: 0.5 10*3/uL — ABNORMAL LOW (ref 0.7–4.0)
MCH: 27.9 pg (ref 26.0–34.0)
MCHC: 31.5 g/dL (ref 30.0–36.0)
MCV: 88.5 fL (ref 78.0–100.0)
MONOS PCT: 5 %
Monocytes Absolute: 0.7 10*3/uL (ref 0.1–1.0)
NEUTROS PCT: 90 %
Neutro Abs: 11.8 10*3/uL — ABNORMAL HIGH (ref 1.7–7.7)
Platelets: 205 10*3/uL (ref 150–400)
RBC: 4.01 MIL/uL — ABNORMAL LOW (ref 4.22–5.81)
RDW: 17.4 % — ABNORMAL HIGH (ref 11.5–15.5)
WBC: 13.1 10*3/uL — ABNORMAL HIGH (ref 4.0–10.5)

## 2017-11-27 LAB — BRAIN NATRIURETIC PEPTIDE: B Natriuretic Peptide: 706.8 pg/mL — ABNORMAL HIGH (ref 0.0–100.0)

## 2017-11-27 LAB — COMPREHENSIVE METABOLIC PANEL
ALK PHOS: 79 U/L (ref 38–126)
ALT: 38 U/L (ref 17–63)
AST: 69 U/L — ABNORMAL HIGH (ref 15–41)
Albumin: 2 g/dL — ABNORMAL LOW (ref 3.5–5.0)
Anion gap: 12 (ref 5–15)
BUN: 63 mg/dL — ABNORMAL HIGH (ref 6–20)
CALCIUM: 7.5 mg/dL — AB (ref 8.9–10.3)
CO2: 23 mmol/L (ref 22–32)
CREATININE: 5 mg/dL — AB (ref 0.61–1.24)
Chloride: 101 mmol/L (ref 101–111)
GFR calc non Af Amer: 13 mL/min — ABNORMAL LOW (ref >60)
GFR, EST AFRICAN AMERICAN: 15 mL/min — AB (ref >60)
Glucose, Bld: 167 mg/dL — ABNORMAL HIGH (ref 65–99)
Potassium: 4.1 mmol/L (ref 3.5–5.1)
SODIUM: 136 mmol/L (ref 135–145)
Total Bilirubin: 2.3 mg/dL — ABNORMAL HIGH (ref 0.3–1.2)
Total Protein: 6.2 g/dL — ABNORMAL LOW (ref 6.5–8.1)

## 2017-11-27 LAB — GLUCOSE, CAPILLARY: Glucose-Capillary: 167 mg/dL — ABNORMAL HIGH (ref 65–99)

## 2017-11-27 LAB — CBG MONITORING, ED: GLUCOSE-CAPILLARY: 154 mg/dL — AB (ref 65–99)

## 2017-11-27 LAB — TROPONIN I: Troponin I: 0.04 ng/mL (ref <0.03)

## 2017-11-27 LAB — I-STAT TROPONIN, ED: TROPONIN I, POC: 0.07 ng/mL (ref 0.00–0.08)

## 2017-11-27 MED ORDER — FERROUS SULFATE 325 (65 FE) MG PO TABS
325.0000 mg | ORAL_TABLET | Freq: Two times a day (BID) | ORAL | Status: DC
  Administered 2017-11-28 – 2017-12-07 (×20): 325 mg via ORAL
  Filled 2017-11-27 (×19): qty 1

## 2017-11-27 MED ORDER — CLINDAMYCIN PHOSPHATE 600 MG/50ML IV SOLN
600.0000 mg | Freq: Once | INTRAVENOUS | Status: AC
  Administered 2017-11-27: 600 mg via INTRAVENOUS
  Filled 2017-11-27: qty 50

## 2017-11-27 MED ORDER — PANTOPRAZOLE SODIUM 40 MG PO TBEC
40.0000 mg | DELAYED_RELEASE_TABLET | Freq: Every day | ORAL | Status: DC
Start: 2017-11-28 — End: 2017-11-27

## 2017-11-27 MED ORDER — FUROSEMIDE 10 MG/ML IJ SOLN
160.0000 mg | Freq: Once | INTRAVENOUS | Status: AC
  Administered 2017-11-27: 160 mg via INTRAVENOUS
  Filled 2017-11-27: qty 16

## 2017-11-27 MED ORDER — HYDRALAZINE HCL 50 MG PO TABS
50.0000 mg | ORAL_TABLET | Freq: Three times a day (TID) | ORAL | Status: DC
  Administered 2017-11-28 – 2017-11-29 (×2): 50 mg via ORAL
  Filled 2017-11-27 (×6): qty 1

## 2017-11-27 MED ORDER — INSULIN ASPART 100 UNIT/ML ~~LOC~~ SOLN
0.0000 [IU] | Freq: Three times a day (TID) | SUBCUTANEOUS | Status: DC
  Administered 2017-11-28 (×2): 4 [IU] via SUBCUTANEOUS
  Administered 2017-12-01 – 2017-12-04 (×7): 3 [IU] via SUBCUTANEOUS
  Administered 2017-12-05 (×2): 4 [IU] via SUBCUTANEOUS
  Administered 2017-12-05: 1 [IU] via SUBCUTANEOUS
  Administered 2017-12-06 (×2): 3 [IU] via SUBCUTANEOUS
  Administered 2017-12-06 – 2017-12-07 (×2): 4 [IU] via SUBCUTANEOUS
  Administered 2017-12-07 (×2): 3 [IU] via SUBCUTANEOUS

## 2017-11-27 MED ORDER — POTASSIUM CHLORIDE CRYS ER 20 MEQ PO TBCR: 40.0000 meq | EXTENDED_RELEASE_TABLET | Freq: Every day | ORAL | Status: DC

## 2017-11-27 MED ORDER — ASPIRIN EC 81 MG PO TBEC
81.0000 mg | DELAYED_RELEASE_TABLET | Freq: Every day | ORAL | Status: DC
  Administered 2017-11-27 – 2017-12-07 (×11): 81 mg via ORAL
  Filled 2017-11-27 (×11): qty 1

## 2017-11-27 MED ORDER — SODIUM CHLORIDE 0.9 % IV SOLN: 250.0000 mL | INTRAVENOUS | Status: DC | PRN

## 2017-11-27 MED ORDER — ISOSORBIDE MONONITRATE ER 60 MG PO TB24
60.0000 mg | ORAL_TABLET | Freq: Every day | ORAL | Status: DC
  Administered 2017-11-27 – 2017-11-29 (×3): 60 mg via ORAL
  Filled 2017-11-27 (×3): qty 1

## 2017-11-27 MED ORDER — SODIUM CHLORIDE 0.9% FLUSH
3.0000 mL | Freq: Two times a day (BID) | INTRAVENOUS | Status: DC
  Administered 2017-11-27 – 2017-11-30 (×5): 3 mL via INTRAVENOUS

## 2017-11-27 MED ORDER — ACETAMINOPHEN 500 MG PO TABS
1000.0000 mg | ORAL_TABLET | Freq: Four times a day (QID) | ORAL | Status: DC | PRN
  Administered 2017-11-27: 1000 mg via ORAL
  Filled 2017-11-27: qty 2

## 2017-11-27 MED ORDER — SODIUM CHLORIDE 0.9% FLUSH: 3.0000 mL | INTRAVENOUS | Status: DC | PRN

## 2017-11-27 MED ORDER — GABAPENTIN 300 MG PO CAPS: 300.0000 mg | ORAL_CAPSULE | Freq: Two times a day (BID) | ORAL | Status: DC

## 2017-11-27 MED ORDER — HEPARIN SODIUM (PORCINE) 5000 UNIT/ML IJ SOLN
5000.0000 [IU] | Freq: Three times a day (TID) | INTRAMUSCULAR | Status: DC
  Administered 2017-11-27 – 2017-12-07 (×30): 5000 [IU] via SUBCUTANEOUS
  Filled 2017-11-27 (×30): qty 1

## 2017-11-27 MED ORDER — ONDANSETRON HCL 4 MG/2ML IJ SOLN: 4.0000 mg | Freq: Four times a day (QID) | INTRAMUSCULAR | Status: DC | PRN

## 2017-11-27 MED ORDER — ATORVASTATIN CALCIUM 10 MG PO TABS: 10.0000 mg | ORAL_TABLET | Freq: Every day | ORAL | Status: DC

## 2017-11-27 MED ORDER — CARVEDILOL 12.5 MG PO TABS
12.5000 mg | ORAL_TABLET | Freq: Two times a day (BID) | ORAL | Status: DC
  Administered 2017-11-28 – 2017-12-06 (×17): 12.5 mg via ORAL
  Filled 2017-11-27 (×16): qty 1

## 2017-11-27 MED ORDER — CLINDAMYCIN PHOSPHATE 600 MG/50ML IV SOLN
600.0000 mg | Freq: Three times a day (TID) | INTRAVENOUS | Status: DC
  Administered 2017-11-27 – 2017-11-30 (×9): 600 mg via INTRAVENOUS
  Filled 2017-11-27 (×8): qty 50

## 2017-11-27 MED ORDER — INSULIN GLARGINE 100 UNIT/ML SOLOSTAR PEN: 20.0000 [IU] | PEN_INJECTOR | SUBCUTANEOUS | Status: DC

## 2017-11-27 MED ORDER — FUROSEMIDE 10 MG/ML IJ SOLN: 160.0000 mg | Freq: Three times a day (TID) | INTRAVENOUS | Status: DC

## 2017-11-27 MED ORDER — INSULIN ASPART 100 UNIT/ML ~~LOC~~ SOLN: 0.0000 [IU] | Freq: Every day | SUBCUTANEOUS | Status: DC

## 2017-11-27 MED ORDER — VITAMIN D (ERGOCALCIFEROL) 1.25 MG (50000 UNIT) PO CAPS: 50000.0000 [IU] | ORAL_CAPSULE | ORAL | Status: DC

## 2017-11-27 MED ORDER — FUROSEMIDE 10 MG/ML IJ SOLN
160.0000 mg | Freq: Four times a day (QID) | INTRAMUSCULAR | Status: DC
  Administered 2017-11-27 – 2017-12-04 (×26): 160 mg via INTRAVENOUS
  Filled 2017-11-27 (×9): qty 16
  Filled 2017-11-27: qty 10
  Filled 2017-11-27 (×4): qty 16
  Filled 2017-11-27: qty 10
  Filled 2017-11-27 (×12): qty 16

## 2017-11-27 NOTE — H&P (Addendum)
History and Physical    Joshua Massey GXQ:[id number] DOB: 12-05-1975 DOA: 11/27/2017  PCP: [name], [name], MD Consultants:  Nephrology Patient coming from:  Home   Chief Complaint: Edema  HPI: Joshua Massey is a 42 y.o. male with h/o chronic systolic CHF, DM2, HTN, CKD stage 3, GERD who has not been seen by a physician in almost a year, who presented to the ED today with c/o "extra fluid" and weight gain. Over the past 4-5 weeks pt has gained approx 60 lbs, up to 350 lbs today from his baseline of 290 lb. He has had progressive BLE edema R>L as well, especially over the last week. He denies dyspnea at rest but has had increased DOE for the last 2 weeks and due to the swelling in his legs, he has been unable to ambulate for the last 2 days. He reports no chest pain, orthopnea or palpitations. He reports that he "went off a low sodium diet" approx 2 months ago because he was "just tired" of it. He admits to eating liberal amounts of salty foods since then. He has been compliant with all of his meds including torsemide and has not noticed any increase or decrease in his UOP or change in his urine. He denies fever/chills.  Pt has known CKD stage III due to DM. He has never been seen by a nephrologist.  ED Course: On arrival to ED pt had BP 95/62 - 109/96, SaO2 88% on RA. On exam he was found to have erythema of RLE, and R great toe diabetic foot ulcer. Doppler was inconclusive for DVT. Per nephrology recommendations by phone, he was given lasix 160 mg IV x 1.   Review of Systems: As per HPI; otherwise review of systems reviewed and negative.   Ambulatory Status:Ambulated without assistance up until 2 days PTA; since has been nonweightbearing due to BLE edema  Past Medical History:  Diagnosis Date  . 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 Diabetes mellitus    adult-onset, type 2  . Exogenous obesity   . GERD (gastroesophageal reflux disease)   . Hypertension      Past Surgical History:  Procedure Laterality Date  . ANKLE SURGERY  2003   plate and screws  . CARDIAC CATHETERIZATION  02/16/2012   50% LAD lesion in mid vessel, otherwise no significant obstructive CAD  . HIP PINNING  1990   bilateral  . IR GENERIC HISTORICAL  09/01/2016   IR FLUORO GUIDE CV LINE RIGHT 09/01/2016 Marybelle Killings, MD [facility]  . IR GENERIC HISTORICAL  09/01/2016   IR US GUIDE VASC ACCESS RIGHT 09/01/2016 Marybelle Killings, MD [facility]  . IR GENERIC HISTORICAL  09/16/2016   IR REMOVAL TUN CV CATH W/O FL 09/16/2016 Arne Cleveland, MD [facility]  . KNEE ARTHROSCOPY Left 08/30/2016   Procedure: ARTHROSCOPY KNEE;  Surgeon: Renette Butters, MD;  Location: [place];  Service: Orthopedics;  Laterality: Left;  . LEFT AND RIGHT HEART CATHETERIZATION WITH CORONARY ANGIOGRAM N/A 02/16/2012   Procedure: LEFT AND RIGHT HEART CATHETERIZATION WITH CORONARY ANGIOGRAM;  Surgeon: Pixie Casino, MD;  Location: [facility] CATH [facility];  Service: Cardiovascular;  Laterality: N/A;  . TRANSTHORACIC ECHOCARDIOGRAM  08/31/2012   EF 35-40%, no significant wall motion abnormalities, diastolic relaxation abnormality - no longer needed LifeVest; LV cavity size mod dilated with mod conc hypertrophy, systolic function mod reduced; mild MR, LA mod dilated    Social History   Socioeconomic History  .  Marital status: Single    Spouse name: Not on file  . Number of children: 6  . Years of education: 27  . Highest education level: Not on file  Occupational History  . Occupation: Brewing technologist    Comment: [name]  . Occupation: newpaper delivery    Employer: [employer]  Social Needs  . Financial resource strain: Not on file  . Food insecurity:    Worry: Not on file    Inability: Not on file  . Transportation needs:    Medical: Not on file    Non-medical: Not on file  Tobacco Use  . Smoking status: Never Smoker  . Smokeless tobacco: Never Used  Substance and Sexual Activity  .  Alcohol use: No  . Drug use: No  . Sexual activity: Yes    Birth control/protection: Condom  Lifestyle  . Physical activity:    Days per week: Not on file    Minutes per session: Not on file  . Stress: Not on file  Relationships  . Social connections:    Talks on phone: Not on file    Gets together: Not on file    Attends religious service: Not on file    Active member of club or organization: Not on file    Attends meetings of clubs or organizations: Not on file    Relationship status: Not on file  . Intimate partner violence:    Fear of current or ex partner: Not on file    Emotionally abused: Not on file    Physically abused: Not on file    Forced sexual activity: Not on file  Other Topics Concern  . Not on file  Social History Narrative  . Not on file    No Known Allergies  Family History  Problem Relation Age of Onset  . Coronary artery disease Brother        CABG at 57, DM  . Heart disease Brother 78       CAD  . Diabetes Mother   . Hypertension Mother   . Hyperlipidemia Mother   . Heart disease Mother   . Diabetes Father   . Hypertension Father     Prior to Admission medications   Medication Sig Start Date End Date Taking? Authorizing Provider  acetaminophen (TYLENOL) 500 MG tablet Take 1,000 mg by mouth every 6 (six) hours as needed for moderate pain or headache.   Yes [provider]  aspirin 81 MG tablet Take 81 mg by mouth daily.   Yes [provider]  carvedilol (COREG) 25 MG tablet TAKE 1 TABLET BY MOUTH TWICE DAILY WITH  A  MEAL 08/18/17  Yes [name], [name], MD  hydrALAZINE (APRESOLINE) 100 MG tablet Take 1 tablet (100 mg total) by mouth 3 (three) times daily. 10/11/16  Yes Shirley Friar, PA-C  isosorbide mononitrate (IMDUR) 60 MG 24 hr tablet TAKE 1 & 1/2 (ONE & ONE-HALF) TABLETS BY MOUTH ONCE DAILY 08/18/17  Yes [name], [name], MD  torsemide (DEMADEX) 20 MG tablet TAKE 3 TABLETS BY MOUTH ONCE DAILY 09/18/17  Yes [name], Betty  G, MD  atorvastatin (LIPITOR) 10 MG tablet Take 1 tablet (10 mg total) by mouth daily with supper. Patient not taking: Reported on 11/27/2017 12/22/16   [name], [name], MD  diclofenac sodium (VOLTAREN) 1 % GEL Apply 4 g topically 4 (four) times daily. Patient not taking: Reported on 11/27/2017 12/20/16   [name], [name], MD  ferrous sulfate 325 (65 FE) MG tablet Take 1 tablet (325 mg total) by mouth 2 (two) times daily with a meal. Patient not taking: Reported on 11/27/2017 11/02/16   [name], [name], MD  gabapentin (NEURONTIN) 300 MG capsule Take 1 capsule (300 mg total) by mouth 2 (two) times daily. Patient not taking: Reported on 11/27/2017 12/20/16   [name], [name], MD  glucose blood (FREESTYLE TEST STRIPS) test strip Use as instructed 09/02/16   Jonetta Osgood, MD  glucose monitoring kit (FREESTYLE) monitoring kit 1 each by Does not apply route 4 (four) times daily - after meals and at bedtime. 1 month Diabetic Testing Supplies for QAC-QHS accuchecks. 09/02/16   Ghimire, Henreitta Leber, MD  insulin aspart (NOVOLOG FLEXPEN) 100 UNIT/ML FlexPen 0-15 Units, Subcutaneous, 3 times daily with meals CBG < 70: implement hypoglycemia protocol-call MD CBG 70 - 120: 0 units CBG 121 - 150: 2 units CBG 151 - 200: 3 units CBG 201 - 250: 5 units CBG 251 - 300: 8 units CBG 301 - 350: 11 units CBG 351 - 400: 15 units CBG > 400:Call MD Patient not taking: Reported on 11/27/2017 09/02/16   Jonetta Osgood, MD  Insulin Glargine (LANTUS SOLOSTAR) 100 UNIT/ML Solostar Pen Inject 20 Units into the skin every morning. And pen needles 1/day Patient not taking: Reported on 11/27/2017 12/22/16   [name], [name], MD  Insulin Pen Needle 32G X 8 MM MISC Use as directed 09/02/16   Jonetta Osgood, MD  KLOR-CON M20 20 MEQ tablet TAKE 2 TABLETS BY MOUTH ONCE DAILY Patient not taking: Reported on 11/27/2017 07/10/17   Bensimhon, Shaune Pascal, MD  Lancets (FREESTYLE) lancets Use as instructed 09/02/16   Jonetta Osgood, MD  Lidocaine  3.75 % CREA Apply 1 application topically 3 (three) times daily as needed. Patient not taking: Reported on 11/27/2017 12/20/16   [name], [name], MD  pantoprazole (PROTONIX) 40 MG tablet Take 1 tablet (40 mg total) by mouth daily before breakfast. Patient not taking: Reported on 11/27/2017 11/02/16   [name], [name], MD  Vitamin D, Ergocalciferol, (DRISDOL) 50000 units CAPS capsule Take 1 capsule by mouth once weekly for 16 weeks, and then every 2 weeks. Patient not taking: Reported on 11/27/2017 12/22/16   [name], [name], MD    Physical Exam: Vitals:   11/27/17 1645 11/27/17 1702 11/27/17 1715 11/27/17 1830  BP: 98/81 95/62 (!) 109/96 102/67  Pulse: 83 84 84 80  Resp: 18 (!) 22  (!) 25  Temp:      TempSrc:      SpO2: (!) 88% 96% 95% 93%  Weight:      Height:         . General: Obese male, Appears calm and comfortable and is NAD lying almost flat . Eyes:  PERRL, EOMI, normal lids, iris . ENT:  grossly normal hearing, lips & tongue, mmm; appropriate dentition . Neck: +JVD, no LAD, masses or thyromegaly; no carotid bruits . Cardiovascular:  Distant heart sounds, RRR, no m/r/g. No LE edema.  Marland Kitchen Respiratory:  Diminished throughout but CTA bilaterally with no wheezes/rales/rhonchi.  Normal respiratory effort. . Abdomen: morbidly obese, soft, NT, ND, NABS . Skin: RLE with erythema and warmth in thigh as well as lower lateral leg . Musculoskeletal:  grossly normal tone BUE/BLE, good ROM, no bony abnormality . Lower extremity: 3+ brawny LE edema with skin weeping bilaterally.  ~1.5 cm R great toe ulcer with mild surrounding erythema, no purulence   .  Psychiatric: grossly normal mood and affect, speech fluent and appropriate, AOx3 . Neurologic: CN 2-12 grossly intact, moves all extremities in coordinated fashion, sensation intact    Radiological Exams on Admission: Dg Chest 2 View  Result Date: 11/27/2017 CLINICAL DATA:  Increased fluid buildup.  Weight gain. EXAM: CHEST - 2 VIEW COMPARISON:   08/16/2016 FINDINGS: There is marked enlargement of the cardiac silhouette. No pleural effusion or edema. No airspace opacities identified. The visualized osseous structures appear intact. IMPRESSION: 1. No acute cardiopulmonary abnormalities. 2. Cardiac enlargement as before. Electronically Signed   By: Kerby Moors M.D.   On: 11/27/2017 15:00   Dg Toe Great Right  Result Date: 11/27/2017 CLINICAL DATA:  Foot ulcer. EXAM: RIGHT GREAT TOE COMPARISON:  None. FINDINGS: Three-view exam of the right great toe shows a soft tissue defect compatible with ulcer. No underlying bony destruction or change to suggest osteomyelitis by plain film exam. Soft tissue swelling is evident. IMPRESSION: No radiographic evidence for osteomyelitis. Electronically Signed   By: Misty Stanley M.D.   On: 11/27/2017 14:57    EKG: Independently reviewed. Rate 87. Sinus rhythm, left posterior fascicular block. Low voltage. No evidence of ischemia, no sig change from prior.   LE dopplers: Right: No cystic structure found in the popliteal fossa. Unable to evaluate fully due to above mentioned limitations. Segments of the veins able to be imaged appear free of deep vein and superficial vein thrombus Left: There is no evidence of deep vein thrombus in the common femoral vein.  PRIOR ECHO 08/17/2016: - Procedure narrative: Transthoracic echocardiography. Image   quality was adequate. Intravenous contrast (Definity) was   administered. - Left ventricle: The cavity size was normal. Wall thickness was   increased in a pattern of mild LVH. Systolic function was   severely reduced. The estimated ejection fraction was in the   range of 25% to 30%. No apical thrombus with Defininty contrast.   Diffuse hypokinesis. The study is not technically sufficient to   allow evaluation of LV diastolic function. - Ventricular septum: Septal motion showed paradox. The contour   showed diastolic flattening and systolic flattening. - Mitral valve:  Mildly thickened leaflets . There was trivial   regurgitation. - Left atrium: Moderately dilated. - Right ventricle: Poorly visualized. The cavity size was   moderately dilated. - Right atrium: Moderately dilated. - Pulmonic valve: There was mild regurgitation. - Pulmonary arteries: PA peak pressure: 56 mm Hg (S). - Inferior vena cava: The vessel was dilated. The respirophasic   diameter changes were blunted (< 50%), consistent with elevated   central venous pressure. - Pericardium, extracardiac: Cannot exclude tamponade physiology -   clinical correlation is advised.  Labs on Admission: I have personally reviewed the available labs and imaging studies at the time of the admission.  Pertinent labs:  WBC 13.1 Hgb 11.2 BNP 707 Trop 0.07 BUN 63 Creat 5.00 (baseline 2.3) Na 136 K 4.1 Cl 101 CO2 23 AST 69 ALT 38 Tbili 2.3 Tprotein 6.2 Albumin 2.0 Ca 7.5   Assessment/Plan Principal Problem:   Volume overload Active Problems:   Hypertensive heart disease with heart failure (HCC)   Hypertension   Non-ischemic cardiomyopathy, EF < 20%   Obesity   Acute on chronic systolic CHF (congestive heart failure) (HCC)   Coronary artery disease, 50% LAD at cath 02/16/12   Type 2 diabetes mellitus with vascular disease ([place])   Acute renal failure (HCC)   CKD (chronic kidney disease), stage III (HCC)   Hypovitaminosis  D   Diabetic foot ulcer ([place])   Cellulitis in diabetic foot ([place])   Acute heart failure ([place])   Hepatopathy   -Acute on chronic systolic CHF with volume overload, with acute hypoxic respiratory failure -AKI on CKD vs progression of CKD to stage V -unclear etiology of CHF exacerbation; doubt ischemia although he does have known CAD. No evidence of acute coronary syndrome: troponin negative, EKG unchanged. No evidence of tachyarrhythmia. Pt reports compliance with meds but not with low sodium diet. Dietary indiscretion is likely the cause. He is extremely volume overloaded  but stable from a respiratory standpoint. There is not a current indication for acute dialysis but nephrology is aware of patient and they have been consulted. -supplemental O2 prn -lasix 160 mg IV q6h, assess response, titrate as needed -closely monitor creat, lytes -telemetry monitoring -r/o acs with serial enzymes -repeat TTE ordered -low sodium, fluid restricted diet -strict I/O, daily weights -avoid nephrotoxins, renally dose all meds -congestive hepatopathy with elevated AST and Tbili; suspect will improve with diuresis; trend CMP -renal function panel in AM  -consider renal ultrasound if AKI does not improve -f/u nephrology recommendations  HTN -will continue home meds carvedilol, hydral, imdur at lower doses than home given his borderline BP -titrate meds back to normal dose if BP tolerates  RLE cellulitis, R great toe DM foot ulcer; could not r/o DVT in RLE given body habitus. No evidence of great toe OM on plain film. Mild leukocytosis. -clindamycin 600 mg IV q8h -BCx x 2 pending -CBC in AM  DM2, last HbA1C 9.7 in July 2018 -not on any basal insulin at home -pt states he is on a SSI only -recheck HbA1C -carb-consistent diet -CBG qAC, qHS with SSI  Hypovitaminosis D: pt reports he had been given vit D to take weekly in the past but was not compliant with this; this needs to be restarted   DVT prophylaxis: Heparin, SCDs Code Status:  Full - confirmed with patient/family  Disposition Plan:  Home once clinically improved Consults called: Nephrology  Admission status: Admit - It is my clinical opinion that admission to [facility] is reasonable and necessary because of the expectation that this patient will require hospital care that crosses at least 2 midnights to treat this condition based on the medical complexity of the problems presented.  Given the aforementioned information, the predictability of an adverse outcome is felt to be significant.     Tiffany Kocher  MD [facility]  If note is complete, please contact covering daytime or nighttime physician. [email or web address] Password [facility]  11/27/2017, 6:51 PM

## 2017-11-27 NOTE — ED Notes (Signed)
Meal tray arrived

## 2017-11-27 NOTE — ED Notes (Signed)
Transport will pick up pt at xray and transport to vascular.

## 2017-11-27 NOTE — ED Provider Notes (Signed)
[facility] EMERGENCY DEPARTMENT Provider Note   CSN: [id number] Arrival date & time: 11/27/17  1222     History   Chief Complaint No chief complaint on file.   HPI Joshua Massey is a 42 y.o. male.  HPI   42 year old male with history of CHF, T2DM, GERD, hypertension who presents emergency department today to be evaluated for fluid overload.  Patient states that his bilateral lower extremities have had fluid buildup over the last several weeks however over the last 5 days edema has gotten worse.  He does note that the right lower extremity is more swollen than the left.  Right lower extremity is also painful.  He denies any shortness of breath, but has been feeling very fatigued. Denies chest pain.  No abdominal pain, nausea, vomiting, diarrhea or urinary symptoms.  No fevers at home.  No URI symptoms.  Does note that he has an ulcer to his right great toe has been present for the last several weeks.  He has not followed up with his doctor about this and has been treating it on his own at home.  States it is painful.  Denies any testicular or scrotal pain or swelling.  No pain around the rectal area. States he has gained about 40 lbs over the last month.  Past Medical History:  Diagnosis Date  . 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 Diabetes mellitus    adult-onset, type 2  . Exogenous obesity   . GERD (gastroesophageal reflux disease)   . Hypertension     Patient Active Problem List   Diagnosis Date Noted  . Hypovitaminosis D 11/27/2017  . Diabetic foot ulcer ([place]) 11/27/2017  . Cellulitis in diabetic foot ([place]) 11/27/2017  . Acute heart failure ([place]) 11/27/2017  . Hypokalemia 05/31/2017  . Diabetic peripheral neuropathy ([place]) 12/20/2016  . Hyperlipidemia associated with type 2 diabetes mellitus ([place]) 11/27/2016  . Shingles 10/11/2016  . Status post peripherally inserted central catheter (PICC) central line placement  09/12/2016  . Medication monitoring encounter 09/12/2016  . Arthritis, septic ([place])   . CKD (chronic kidney disease), stage III ([place])   . AKI (acute kidney injury) ([place])   . Peripheral edema   . Acute renal failure ([place]) 08/17/2016  . Acute systolic CHF (congestive heart failure) ([place]) 08/17/2016  . Type 2 diabetes mellitus with vascular disease ([place]) 10/21/2013  . Chronic systolic heart failure ([place]) 02/27/2012  . Coronary artery disease, 50% LAD at cath 02/16/12 02/23/2012  . Family history of coronary artery disease 02/23/2012  . Cardiogenic shock ([place]) 02/17/2012  . NSVT (nonsustained ventricular tachycardia) ([place]) 02/16/2012  . Obesity 02/16/2012  . Acute on chronic systolic CHF (congestive heart failure) ([place]) 02/16/2012  . CHF (congestive heart failure) ([place]) 02/15/2012  . Hypertension 02/15/2012  . Non-ischemic cardiomyopathy, EF < 20% 02/15/2012  . TINEA CRURIS 09/25/2009  . ALLERGIC RHINITIS CAUSE UNSPECIFIED 09/25/2009  . SCROTAL ABSCESS 09/17/2009  . Hypertensive heart disease with heart failure ([place]) 01/26/2009    Past Surgical History:  Procedure Laterality Date  . ANKLE SURGERY  2003   plate and screws  . CARDIAC CATHETERIZATION  02/16/2012   50% LAD lesion in mid vessel, otherwise no significant obstructive CAD  . HIP PINNING  1990   bilateral  . IR GENERIC HISTORICAL  09/01/2016   IR FLUORO GUIDE CV LINE RIGHT 09/01/2016 Marybelle Killings, MD [facility]  . IR GENERIC HISTORICAL  09/01/2016   IR US  GUIDE VASC ACCESS RIGHT 09/01/2016 Marybelle Killings, MD [facility]  . IR GENERIC HISTORICAL  09/16/2016   IR REMOVAL TUN CV CATH W/O FL 09/16/2016 Arne Cleveland, MD [facility]  . KNEE ARTHROSCOPY Left 08/30/2016   Procedure: ARTHROSCOPY KNEE;  Surgeon: Renette Butters, MD;  Location: [facility];  Service: Orthopedics;  Laterality: Left;  . LEFT AND RIGHT HEART CATHETERIZATION WITH CORONARY ANGIOGRAM N/A 02/16/2012   Procedure: LEFT AND RIGHT HEART CATHETERIZATION WITH CORONARY  ANGIOGRAM;  Surgeon: Pixie Casino, MD;  Location: [facility] CATH LAB;  Service: Cardiovascular;  Laterality: N/A;  . TRANSTHORACIC ECHOCARDIOGRAM  08/31/2012   EF 35-40%, no significant wall motion abnormalities, diastolic relaxation abnormality - no longer needed LifeVest; LV cavity size mod dilated with mod conc hypertrophy, systolic function mod reduced; mild MR, LA mod dilated      Home Medications    Prior to Admission medications   Medication Sig Start Date End Date Taking? Authorizing Provider  acetaminophen (TYLENOL) 500 MG tablet Take 1,000 mg by mouth every 6 (six) hours as needed for moderate pain or headache.   Yes [provider]  aspirin 81 MG tablet Take 81 mg by mouth daily.   Yes [provider]  carvedilol (COREG) 25 MG tablet TAKE 1 TABLET BY MOUTH TWICE DAILY WITH  A  MEAL 08/18/17  Yes [name], [name], MD  hydrALAZINE (APRESOLINE) 100 MG tablet Take 1 tablet (100 mg total) by mouth 3 (three) times daily. 10/11/16  Yes Shirley Friar, PA-C  isosorbide mononitrate (IMDUR) 60 MG 24 hr tablet TAKE 1 & 1/2 (ONE & ONE-HALF) TABLETS BY MOUTH ONCE DAILY 08/18/17  Yes [name], [name], MD  torsemide (DEMADEX) 20 MG tablet TAKE 3 TABLETS BY MOUTH ONCE DAILY 09/18/17  Yes [name], [name], MD  atorvastatin (LIPITOR) 10 MG tablet Take 1 tablet (10 mg total) by mouth daily with supper. Patient not taking: Reported on 11/27/2017 12/22/16   [name], [name], MD  diclofenac sodium (VOLTAREN) 1 % GEL Apply 4 g topically 4 (four) times daily. Patient not taking: Reported on 11/27/2017 12/20/16   [name], [name], MD  ferrous sulfate 325 (65 FE) MG tablet Take 1 tablet (325 mg total) by mouth 2 (two) times daily with a meal. Patient not taking: Reported on 11/27/2017 11/02/16   [name], [name], MD  gabapentin (NEURONTIN) 300 MG capsule Take 1 capsule (300 mg total) by mouth 2 (two) times daily. Patient not taking: Reported on 11/27/2017 12/20/16   [name], [name], MD  glucose blood  (FREESTYLE TEST STRIPS) test strip Use as instructed 09/02/16   Jonetta Osgood, MD  glucose monitoring kit (FREESTYLE) monitoring kit 1 each by Does not apply route 4 (four) times daily - after meals and at bedtime. 1 month Diabetic Testing Supplies for QAC-QHS accuchecks. 09/02/16   Ghimire, Henreitta Leber, MD  insulin aspart (NOVOLOG FLEXPEN) 100 UNIT/ML FlexPen 0-15 Units, Subcutaneous, 3 times daily with meals CBG < 70: implement hypoglycemia protocol-call MD CBG 70 - 120: 0 units CBG 121 - 150: 2 units CBG 151 - 200: 3 units CBG 201 - 250: 5 units CBG 251 - 300: 8 units CBG 301 - 350: 11 units CBG 351 - 400: 15 units CBG > 400:Call MD Patient not taking: Reported on 11/27/2017 09/02/16   Jonetta Osgood, MD  Insulin Glargine (LANTUS SOLOSTAR) 100 UNIT/ML Solostar Pen Inject 20 Units into the skin every morning. And pen needles 1/day Patient not taking: Reported on  11/27/2017 12/22/16   [name], [name], MD  Insulin Pen Needle 32G X 8 MM MISC Use as directed 09/02/16   Jonetta Osgood, MD  KLOR-CON M20 20 MEQ tablet TAKE 2 TABLETS BY MOUTH ONCE DAILY Patient not taking: Reported on 11/27/2017 07/10/17   Bensimhon, Shaune Pascal, MD  Lancets (FREESTYLE) lancets Use as instructed 09/02/16   Jonetta Osgood, MD  Lidocaine 3.75 % CREA Apply 1 application topically 3 (three) times daily as needed. Patient not taking: Reported on 11/27/2017 12/20/16   [name], [name], MD  pantoprazole (PROTONIX) 40 MG tablet Take 1 tablet (40 mg total) by mouth daily before breakfast. Patient not taking: Reported on 11/27/2017 11/02/16   [name], [name], MD  Vitamin D, Ergocalciferol, (DRISDOL) 50000 units CAPS capsule Take 1 capsule by mouth once weekly for 16 weeks, and then every 2 weeks. Patient not taking: Reported on 11/27/2017 12/22/16   [name], [name], MD    Family History Family History  Problem Relation Age of Onset  . Coronary artery disease Brother        CABG at 54, DM  . Heart disease Brother 16        CAD  . Diabetes Mother   . Hypertension Mother   . Hyperlipidemia Mother   . Heart disease Mother   . Diabetes Father   . Hypertension Father     Social History Social History   Tobacco Use  . Smoking status: Never Smoker  . Smokeless tobacco: Never Used  Substance Use Topics  . Alcohol use: No  . Drug use: No     Allergies   Patient has no known allergies.   Review of Systems Review of Systems  Constitutional: Negative for chills and fever.  HENT: Negative for ear pain and sore throat.   Eyes: Negative for pain and visual disturbance.  Respiratory: Negative for cough and shortness of breath.   Cardiovascular: Positive for leg swelling. Negative for chest pain.  Gastrointestinal: Negative for abdominal pain, constipation, diarrhea, nausea and vomiting.  Genitourinary: Negative for dysuria, flank pain, frequency and hematuria.  Musculoskeletal:       BLE swelling, R>L, pain to RLE  Skin: Positive for color change and wound.  Neurological: Negative for headaches.  All other systems reviewed and are negative.  Physical Exam Updated Vital Signs BP 94/74 (BP Location: Right Arm)   Pulse 81   Temp (!) 97.4 F (36.3 C) (Oral)   Resp 19   Ht 5' 11"  (1.803 m)   Wt (!) 163.2 kg (359 lb 11.2 oz)   SpO2 96%   BMI 50.17 kg/m   Physical Exam  Constitutional: He is oriented to person, place, and time. He appears well-developed and well-nourished.  HENT:  Head: Normocephalic and atraumatic.  Mouth/Throat: Oropharynx is clear and moist.  Eyes: Pupils are equal, round, and reactive to light. Conjunctivae and EOM are normal. No scleral icterus.  Neck: Normal range of motion. Neck supple.  Cardiovascular: Normal rate, regular rhythm and normal heart sounds.  No murmur heard. Pulmonary/Chest: Effort normal. No respiratory distress.  Scant wheezes to bilat upper lung fields. No crackles or ronchi. No tachypnea. Speaking in full sentences.  Abdominal: Soft. Bowel sounds are  normal. There is no tenderness.  Morbidly obese.  No edema to the abdomen.  Musculoskeletal:  3+ pitting edema to the bilateral lower extremities.  Erythema and warmth noted to right thigh area which is new for patient.  Mildly tender palpation.  1 to  2 cm diabetic foot ulcer to right great toe as pictured below  Neurological: He is alert and oriented to person, place, and time.  Skin: Skin is warm and dry.  Psychiatric: He has a normal mood and affect.  Nursing note and vitals reviewed.   ED Treatments / Results  Labs (all labs ordered are listed, but only abnormal results are displayed) Labs Reviewed  CBC WITH DIFFERENTIAL/PLATELET - Abnormal; Notable for the following components:      Result Value   WBC 13.1 (*)    RBC 4.01 (*)    Hemoglobin 11.2 (*)    HCT 35.5 (*)    RDW 17.4 (*)    Neutro Abs 11.8 (*)    Lymphs Abs 0.5 (*)    All other components within normal limits  COMPREHENSIVE METABOLIC PANEL - Abnormal; Notable for the following components:   Glucose, Bld 167 (*)    BUN 63 (*)    Creatinine, Ser 5.00 (*)    Calcium 7.5 (*)    Total Protein 6.2 (*)    Albumin 2.0 (*)    AST 69 (*)    Total Bilirubin 2.3 (*)    GFR calc non Af Amer 13 (*)    GFR calc Af Amer 15 (*)    All other components within normal limits  BRAIN NATRIURETIC PEPTIDE - Abnormal; Notable for the following components:   B Natriuretic Peptide 706.8 (*)    All other components within normal limits  CBG MONITORING, ED - Abnormal; Notable for the following components:   Glucose-Capillary 154 (*)    All other components within normal limits  URINALYSIS, ROUTINE W REFLEX MICROSCOPIC  NA AND K (SODIUM & POTASSIUM), RAND UR  PROTEIN / CREATININE RATIO, URINE  HIV ANTIBODY (ROUTINE TESTING)  BASIC METABOLIC PANEL  I-STAT TROPONIN, ED    EKG EKG Interpretation  Date/Time:  Monday November 27 2017 12:36:38 EDT Ventricular Rate:  87 PR Interval:    QRS Duration: 108 QT Interval:  411 QTC  Calculation: 495 R Axis:   116 Text Interpretation:  Sinus rhythm Left posterior fascicular block Low voltage with right axis deviation Consider anterior infarct since last tracing no significant change Confirmed by Malvin Johns [phone]) on 11/27/2017 2:58:53 PM   Radiology Dg Chest 2 View  Result Date: 11/27/2017 CLINICAL DATA:  Increased fluid buildup.  Weight gain. EXAM: CHEST - 2 VIEW COMPARISON:  08/16/2016 FINDINGS: There is marked enlargement of the cardiac silhouette. No pleural effusion or edema. No airspace opacities identified. The visualized osseous structures appear intact. IMPRESSION: 1. No acute cardiopulmonary abnormalities. 2. Cardiac enlargement as before. Electronically Signed   By: Kerby Moors M.D.   On: 11/27/2017 15:00   Dg Toe Great Right  Result Date: 11/27/2017 CLINICAL DATA:  Foot ulcer. EXAM: RIGHT GREAT TOE COMPARISON:  None. FINDINGS: Three-view exam of the right great toe shows a soft tissue defect compatible with ulcer. No underlying bony destruction or change to suggest osteomyelitis by plain film exam. Soft tissue swelling is evident. IMPRESSION: No radiographic evidence for osteomyelitis. Electronically Signed   By: Misty Stanley M.D.   On: 11/27/2017 14:57    Procedures Procedures (including critical care time)  Medications Ordered in ED Medications  acetaminophen (TYLENOL) tablet 1,000 mg (has no administration in time range)  aspirin EC tablet 81 mg (has no administration in time range)  carvedilol (COREG) tablet 12.5 mg (has no administration in time range)  ferrous sulfate tablet 325 mg (has no administration in  time range)  hydrALAZINE (APRESOLINE) tablet 50 mg (has no administration in time range)  isosorbide mononitrate (IMDUR) 24 hr tablet 60 mg (has no administration in time range)  sodium chloride flush (NS) 0.9 % injection 3 mL (has no administration in time range)  sodium chloride flush (NS) 0.9 % injection 3 mL (has no administration in  time range)  0.9 %  sodium chloride infusion (has no administration in time range)  ondansetron (ZOFRAN) injection 4 mg (has no administration in time range)  heparin injection 5,000 Units (has no administration in time range)  insulin aspart (novoLOG) injection 0-20 Units (has no administration in time range)  insulin aspart (novoLOG) injection 0-5 Units (has no administration in time range)  clindamycin (CLEOCIN) IVPB 600 mg (has no administration in time range)  clindamycin (CLEOCIN) IVPB 600 mg (0 mg Intravenous Stopped 11/27/17 1722)  furosemide (LASIX) 160 mg in dextrose 5 % 50 mL IVPB (0 mg Intravenous Stopped 11/27/17 1931)     Initial Impression / Assessment and Plan / ED Course  I have reviewed the triage vital signs and the nursing notes.  Pertinent labs & imaging results that were available during my care of the patient were reviewed by me and considered in my medical decision making (see chart for details).   4:42 PM CONSULT with Dr. Steffanie Dunn with hospitalist service who recommended consulting nephrology.   5:45 PM CONSULT with Dr. Florene Glen, from nephrology who will consult on the patient. He recommended 160 IV lasix.  5:52 PM Spoke with Dr. Steffanie Dunn about nephrology recommendations. She will admit the patient.   Final Clinical Impressions(s) / ED Diagnoses   Final diagnoses:  Acute renal failure, unspecified acute renal failure type (HCC)  Hypervolemia, unspecified hypervolemia type  Acute on chronic congestive heart failure, unspecified heart failure type ([place])  Diabetic ulcer of toe of right foot associated with diabetes mellitus of other type, unspecified ulcer stage (HCC)  Cellulitis, unspecified cellulitis site   Patient with lower cavity edema and 40 pound weight gain over the past month.  Mildly tachypneic with O2 sats above 95%.  Blood pressures been stable as well as heart rate.  Afebrile.  Cardiac exam is grossly benign.  Lungs sound clear.  Abdomen soft and  nontender.  Appears fluid overloaded and lower extremity's have 3+ edema bilaterally.  Some erythema to right thigh which may represent cellulitis.  Also with diabetic foot ulcer to right great toe.  Chest x-ray with cardiomegaly but no evidence of pulmonary edema or infiltrates. Cbc with leukocytosis to 13.1. Mild anemia, stable.  CMP with elevated renal function to 5 from 2.3 range. Total bili elevated as well as AST.  BNP elevated to 700.  Trop WNL. ECG with NSR. Unchanged from prior. Cellulitis on RLE will tx with IV clinda. Also has diabetic foot ulcer on right great toe. No obvious bony changes on xray to suggest osteomyelitis. No evidence of necrosis.   Will require admission for acute renal failure, hypervolemia, CHF exacerbation. Nephrology consulted due to concern for cardiorenal syndrome, recommendations above.  Pt admitted to hospitalist service.  ED Discharge Orders    None       Bishop Dublin 11/27/17 2156    Malvin Johns, MD 11/27/17 2204

## 2017-11-27 NOTE — ED Notes (Signed)
Bedpan removed from pt.

## 2017-11-27 NOTE — ED Notes (Signed)
Report attempted 

## 2017-11-27 NOTE — ED Triage Notes (Signed)
Pt arrived from home via EMS. Formerly in [employer] [employer] for CHF. Pt reports increased fluid build up and weight gain of 40lbs over the last month. Increased weakness, decreased appetitive, increased water intake over last four days. Pt taking meds with the exception of insulin.

## 2017-11-27 NOTE — Consult Note (Signed)
HPI: I was asked by Joshua Massey to see Joshua Massey who is a 42 y.o. male with a longstanding history of diabetes mellitus and known CKD 3, cr 2.6 in July 2018.  He has not sought medical attention in over a year.  He has never seen a nephrologist.  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 Joshua Massey reports to me a 50 to 60 pound weight gain of fluid over several months which is why he decided to " get checked into the hospital". On admission today creatinine was 5.84m/dl.   He denies significant SOB. 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 He denies significant voiding problems.  Renal [place]2018/03/17no hydro and nl sized kidneys, UAs in the past have shown proteinuria. He denies diabetic neuropathy  Past Medical History:  Diagnosis Date  . 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 Diabetes mellitus    adult-onset, type 2  . Exogenous obesity   . GERD (gastroesophageal reflux disease)   . Hypertension    Past Surgical History:  Procedure Laterality Date  . ANKLE SURGERY  203/17/03  plate and screws  . CARDIAC CATHETERIZATION  02/16/2012   50% LAD lesion in mid vessel, otherwise no significant obstructive CAD  . HIP PINNING  1990   bilateral  . IR GENERIC HISTORICAL  09/01/2016   IR FLUORO GUIDE CV LINE RIGHT 09/01/2016 Joshua Killings Massey Joshua Massey  . IR GENERIC HISTORICAL  09/01/2016   IR [place] VASC ACCESS RIGHT 09/01/2016 Joshua Killings Massey Joshua Massey  . IR GENERIC HISTORICAL  09/16/2016   IR REMOVAL TUN CV CATH W/O FL 09/16/2016 Joshua Cleveland Massey Joshua Massey  . KNEE ARTHROSCOPY Left 08/30/2016   Procedure: ARTHROSCOPY KNEE;  Surgeon: Joshua Butters Massey;  [place]  Service: Orthopedics;  Laterality: Left;  . LEFT AND RIGHT HEART CATHETERIZATION WITH CORONARY ANGIOGRAM N/A 02/16/2012   Procedure: LEFT AND RIGHT HEART  CATHETERIZATION WITH CORONARY ANGIOGRAM;  Surgeon: KPixie Casino Massey;  [facility];  Service: Cardiovascular;  Laterality: N/A;  . TRANSTHORACIC ECHOCARDIOGRAM  08/31/2012   EF 35-40%, no significant wall motion abnormalities, diastolic relaxation abnormality - no longer needed LifeVest; LV cavity size mod dilated with mod conc hypertrophy, systolic function mod reduced; mild Joshua, LA mod dilated   Social History:  reports that he has never smoked. He has never used smokeless tobacco. He reports that he does not drink alcohol or use drugs. Allergies: No Known Allergies Family History  Problem Relation Age of Onset  . Coronary artery disease Brother        CABG at 313 DM  . Heart disease Brother 351      CAD  . Diabetes Mother   . Hypertension Mother   . Hyperlipidemia Mother   . Heart disease Mother   . Diabetes Father   . Hypertension Father     Medications:  Scheduled: . aspirin EC  81 mg Oral Daily  . [START ON 11/28/2017] carvedilol  12.5 mg Oral BID WC  . [START ON 11/28/2017] ferrous sulfate  325 mg Oral BID WC  . heparin  5,000 Units Subcutaneous Q8H  . hydrALAZINE  50 mg Oral TID  . [START ON 11/28/2017] insulin aspart  0-20 Units Subcutaneous TID WC  . insulin aspart  0-5 Units Subcutaneous QHS  . isosorbide  mononitrate  60 mg Oral Daily  . sodium chloride flush  3 mL Intravenous Q12H     ROS: as per HPI Blood pressure 94/74, pulse 81, temperature (!) 97.4 F (36.3 C), temperature source Oral, resp. rate 19, height 5' 11"  (1.803 m), weight (!) 163.2 kg (359 lb 11.2 oz), SpO2 96 %.  General appearance: alert, cooperative and appears stated age Head: Normocephalic, without obvious abnormality, atraumatic Throat: missing some teeth Resp: clear to auscultation bilaterally Chest wall: no tenderness Cardio: regular rate and rhythm, S1, S2 normal, no murmur, click, rub or gallop GI: obese Extremities: edema 2+ and chronic changes in BLEs Skin: hyperpigmentation  and chronic changes in distal LEs Neurologic: Grossly normal Results for orders placed or performed during the hospital encounter of 11/27/17 (from the past 48 hour(s))  CBG monitoring, ED     Status: Abnormal   Collection Time: 11/27/17 12:39 PM  Result Value Ref Range   Glucose-Capillary 154 (H) 65 - 99 mg/dL  CBC with Differential     Status: Abnormal   Collection Time: 11/27/17  2:22 PM  Result Value Ref Range   WBC 13.1 (H) 4.0 - 10.5 K/uL   RBC 4.01 (L) 4.22 - 5.81 MIL/uL   Hemoglobin 11.2 (L) 13.0 - 17.0 g/dL   HCT 35.5 (L) 39.0 - 52.0 %   MCV 88.5 78.0 - 100.0 fL   MCH 27.9 26.0 - 34.0 pg   MCHC 31.5 30.0 - 36.0 g/dL   RDW 17.4 (H) 11.5 - 15.5 %   Platelets 205 150 - 400 K/uL   Neutrophils Relative % 90 %   Lymphocytes Relative 4 %   Monocytes Relative 5 %   Eosinophils Relative 1 %   Basophils Relative 0 %   Neutro Abs 11.8 (H) 1.7 - 7.7 K/uL   Lymphs Abs 0.5 (L) 0.7 - 4.0 K/uL   Monocytes Absolute 0.7 0.1 - 1.0 K/uL   Eosinophils Absolute 0.1 0.0 - 0.7 K/uL   Basophils Absolute 0.0 0.0 - 0.1 K/uL   RBC Morphology POLYCHROMASIA PRESENT     Comment: Performed at [facility], [address], [place], [place] [address]  Comprehensive metabolic panel     Status: Abnormal   Collection Time: 11/27/17  2:22 PM  Result Value Ref Range   Sodium 136 135 - 145 mmol/L   Potassium 4.1 3.5 - 5.1 mmol/L   Chloride 101 101 - 111 mmol/L   CO2 23 22 - 32 mmol/L   Glucose, Bld 167 (H) 65 - 99 mg/dL   BUN 63 (H) 6 - 20 mg/dL   Creatinine, Ser 5.00 (H) 0.61 - 1.24 mg/dL   Calcium 7.5 (L) 8.9 - 10.3 mg/dL   Total Protein 6.2 (L) 6.5 - 8.1 g/dL   Albumin 2.0 (L) 3.5 - 5.0 g/dL   AST 69 (H) 15 - 41 U/L   ALT 38 17 - 63 U/L   Alkaline Phosphatase 79 38 - 126 U/L   Total Bilirubin 2.3 (H) 0.3 - 1.2 mg/dL   GFR calc non Af Amer 13 (L) >60 mL/min   GFR calc Af Amer 15 (L) >60 mL/min    Comment: (NOTE) The eGFR has been calculated using the CKD EPI equation. This calculation  has not been validated in all clinical situations. eGFR's persistently <60 mL/min signify possible Chronic Kidney Disease.    Anion gap 12 5 - 15    Comment: Performed at [facility] [address], [place], [place]  [address]  Brain natriuretic peptide     Status: Abnormal   Collection Time: 11/27/17  2:22 PM  Result Value Ref Range   B Natriuretic Peptide 706.8 (H) 0.0 - 100.0 pg/mL    Comment: Performed at [facility] [address], [place],  [address]  I-Stat Troponin, ED (not at [facility])     Status: None   Collection Time: 11/27/17  2:29 PM  Result Value Ref Range   Troponin i, poc 0.07 0.00 - 0.08 ng/mL   Comment 3            Comment: Due to the release kinetics of cTnI, a negative result within the first hours of the onset of symptoms does not rule out myocardial infarction with certainty. If myocardial infarction is still suspected, repeat the test at appropriate intervals.    Dg Chest 2 View  Result Date: 11/27/2017 CLINICAL DATA:  Increased fluid buildup.  Weight gain. EXAM: CHEST - 2 VIEW COMPARISON:  08/16/2016 FINDINGS: There is marked enlargement of the cardiac silhouette. No pleural effusion or edema. No airspace opacities identified. The visualized osseous structures appear intact. IMPRESSION: 1. No acute cardiopulmonary abnormalities. 2. Cardiac enlargement as before. Electronically Signed   By: Kerby Moors M.D.   On: 11/27/2017 15:00   Dg Toe Great Right  Result Date: 11/27/2017 CLINICAL DATA:  Foot ulcer. EXAM: RIGHT GREAT TOE COMPARISON:  None. FINDINGS: Three-view exam of the right great toe shows a soft tissue defect compatible with ulcer. No underlying bony destruction or change to suggest osteomyelitis by plain film exam. Soft tissue swelling is evident. IMPRESSION: No radiographic evidence for osteomyelitis. Electronically Signed   By: Misty Stanley M.D.   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

## 2017-11-27 NOTE — ED Notes (Signed)
Pt informed that we need to collect a UA when he next uses the bathroom. Pt verbalized understanding.

## 2017-11-27 NOTE — ED Notes (Signed)
Patient transported to X-ray 

## 2017-11-27 NOTE — ED Notes (Signed)
Heart Healthy diet dinner tray ordered. 

## 2017-11-27 NOTE — ED Notes (Signed)
Pt requesting bedpan states he is unable to walk

## 2017-11-27 NOTE — Progress Notes (Signed)
Right lower extremity venous duplex completed. Technically limited due to body habitus. Unable to visualize all segments of the veins. Of those imaged there appears to be no evidence of DVT, superficial thrombosis, or Baker's cyst. Inconclusive study. [facility], [place] 11/27/2017 3:48 PM

## 2017-11-27 NOTE — Progress Notes (Signed)
Multiple open sores about 1/4 cm over bilateral lower legs. Blisters also noted.

## 2017-11-28 ENCOUNTER — Other Ambulatory Visit: Payer: Self-pay

## 2017-11-28 ENCOUNTER — Inpatient Hospital Stay (HOSPITAL_COMMUNITY): Payer: BLUE CROSS/BLUE SHIELD

## 2017-11-28 DIAGNOSIS — I340 Nonrheumatic mitral (valve) insufficiency: Secondary | ICD-10-CM

## 2017-11-28 DIAGNOSIS — I361 Nonrheumatic tricuspid (valve) insufficiency: Secondary | ICD-10-CM

## 2017-11-28 DIAGNOSIS — L03119 Cellulitis of unspecified part of limb: Secondary | ICD-10-CM

## 2017-11-28 DIAGNOSIS — L97919 Non-pressure chronic ulcer of unspecified part of right lower leg with unspecified severity: Secondary | ICD-10-CM

## 2017-11-28 DIAGNOSIS — E8779 Other fluid overload: Secondary | ICD-10-CM

## 2017-11-28 DIAGNOSIS — N183 Chronic kidney disease, stage 3 (moderate): Secondary | ICD-10-CM

## 2017-11-28 DIAGNOSIS — L97519 Non-pressure chronic ulcer of other part of right foot with unspecified severity: Secondary | ICD-10-CM

## 2017-11-28 DIAGNOSIS — I87333 Chronic venous hypertension (idiopathic) with ulcer and inflammation of bilateral lower extremity: Secondary | ICD-10-CM

## 2017-11-28 DIAGNOSIS — E11628 Type 2 diabetes mellitus with other skin complications: Secondary | ICD-10-CM

## 2017-11-28 DIAGNOSIS — E11621 Type 2 diabetes mellitus with foot ulcer: Secondary | ICD-10-CM

## 2017-11-28 DIAGNOSIS — L97929 Non-pressure chronic ulcer of unspecified part of left lower leg with unspecified severity: Secondary | ICD-10-CM

## 2017-11-28 DIAGNOSIS — I509 Heart failure, unspecified: Secondary | ICD-10-CM

## 2017-11-28 LAB — ECHOCARDIOGRAM COMPLETE
Height: 71 in
Weight: 5841.31 oz

## 2017-11-28 LAB — HEMOGLOBIN A1C
Hgb A1c MFr Bld: 11.5 % — ABNORMAL HIGH (ref 4.8–5.6)
Mean Plasma Glucose: 283.35 mg/dL

## 2017-11-28 LAB — RENAL FUNCTION PANEL
ANION GAP: 8 (ref 5–15)
Albumin: 1.8 g/dL — ABNORMAL LOW (ref 3.5–5.0)
BUN: 73 mg/dL — ABNORMAL HIGH (ref 6–20)
CALCIUM: 7.3 mg/dL — AB (ref 8.9–10.3)
CO2: 23 mmol/L (ref 22–32)
Chloride: 105 mmol/L (ref 101–111)
Creatinine, Ser: 5.39 mg/dL — ABNORMAL HIGH (ref 0.61–1.24)
GFR calc Af Amer: 14 mL/min — ABNORMAL LOW (ref >60)
GFR calc non Af Amer: 12 mL/min — ABNORMAL LOW (ref >60)
GLUCOSE: 162 mg/dL — AB (ref 65–99)
Phosphorus: 5.6 mg/dL — ABNORMAL HIGH (ref 2.5–4.6)
Potassium: 4.2 mmol/L (ref 3.5–5.1)
SODIUM: 136 mmol/L (ref 135–145)

## 2017-11-28 LAB — COMPREHENSIVE METABOLIC PANEL
ALT: 37 U/L (ref 17–63)
AST: 58 U/L — ABNORMAL HIGH (ref 15–41)
Albumin: 1.9 g/dL — ABNORMAL LOW (ref 3.5–5.0)
Alkaline Phosphatase: 72 U/L (ref 38–126)
Anion gap: 12 (ref 5–15)
BUN: 72 mg/dL — ABNORMAL HIGH (ref 6–20)
CO2: 22 mmol/L (ref 22–32)
Calcium: 7.4 mg/dL — ABNORMAL LOW (ref 8.9–10.3)
Chloride: 102 mmol/L (ref 101–111)
Creatinine, Ser: 5.51 mg/dL — ABNORMAL HIGH (ref 0.61–1.24)
GFR calc Af Amer: 14 mL/min — ABNORMAL LOW (ref >60)
GFR calc non Af Amer: 12 mL/min — ABNORMAL LOW (ref >60)
Glucose, Bld: 163 mg/dL — ABNORMAL HIGH (ref 65–99)
Potassium: 4.2 mmol/L (ref 3.5–5.1)
Sodium: 136 mmol/L (ref 135–145)
Total Bilirubin: 1.9 mg/dL — ABNORMAL HIGH (ref 0.3–1.2)
Total Protein: 5.9 g/dL — ABNORMAL LOW (ref 6.5–8.1)

## 2017-11-28 LAB — GLUCOSE, CAPILLARY
Glucose-Capillary: 156 mg/dL — ABNORMAL HIGH (ref 65–99)
Glucose-Capillary: 157 mg/dL — ABNORMAL HIGH (ref 65–99)
Glucose-Capillary: 128 mg/dL — ABNORMAL HIGH (ref 65–99)

## 2017-11-28 LAB — TROPONIN I: Troponin I: 0.1 ng/mL (ref <0.03)

## 2017-11-28 LAB — HIV ANTIBODY (ROUTINE TESTING W REFLEX): HIV Screen 4th Generation wRfx: NONREACTIVE

## 2017-11-28 MED ORDER — PERFLUTREN LIPID MICROSPHERE
1.0000 mL | INTRAVENOUS | Status: AC | PRN
  Administered 2017-11-28: 2 mL via INTRAVENOUS
  Filled 2017-11-28: qty 10

## 2017-11-28 MED ORDER — SILVER SULFADIAZINE 1 % EX CREA
TOPICAL_CREAM | Freq: Every day | CUTANEOUS | Status: DC
  Administered 2017-11-28 – 2017-12-07 (×10): via TOPICAL
  Filled 2017-11-28: qty 85

## 2017-11-28 MED ORDER — INSULIN GLARGINE 100 UNIT/ML ~~LOC~~ SOLN
7.0000 [IU] | Freq: Every day | SUBCUTANEOUS | Status: DC
  Administered 2017-11-28: 7 [IU] via SUBCUTANEOUS
  Filled 2017-11-28: qty 0.07

## 2017-11-28 MED ORDER — OXYCODONE-ACETAMINOPHEN 5-325 MG PO TABS
1.0000 | ORAL_TABLET | Freq: Four times a day (QID) | ORAL | Status: DC | PRN
  Administered 2017-11-28 – 2017-12-03 (×14): 2 via ORAL
  Administered 2017-12-03: 1 via ORAL
  Administered 2017-12-04 – 2017-12-06 (×5): 2 via ORAL
  Administered 2017-12-06: 1 via ORAL
  Administered 2017-12-06: 2 via ORAL
  Filled 2017-11-28 (×7): qty 2
  Filled 2017-11-28: qty 1
  Filled 2017-11-28 (×12): qty 2
  Filled 2017-11-28: qty 1
  Filled 2017-11-28: qty 2

## 2017-11-28 NOTE — Consult Note (Signed)
ORTHOPAEDIC CONSULTATION  REQUESTING PHYSICIAN: Mendel Corning, MD  Chief Complaint: [name] grade 1 ulcer right great toe with venous insufficiency ulcers bilateral lower extremities  HPI: Joshua Massey is a 42 y.o. male who presents with [name] grade 1 ulcer right great toe.  Patient states the ulcer is been present for several weeks.  He states he is noticed increased swelling drainage and ulceration from both legs and states he was admitted to get off the fluid.  Past Medical History:  Diagnosis Date  . 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 Diabetes mellitus    adult-onset, type 2  . Exogenous obesity   . GERD (gastroesophageal reflux disease)   . Hypertension    Past Surgical History:  Procedure Laterality Date  . ANKLE SURGERY  2003   plate and screws  . CARDIAC CATHETERIZATION  02/16/2012   50% LAD lesion in mid vessel, otherwise no significant obstructive CAD  . HIP PINNING  1990   bilateral  . IR GENERIC HISTORICAL  09/01/2016   IR FLUORO GUIDE CV LINE RIGHT 09/01/2016 Marybelle Killings, MD [facility]  . IR GENERIC HISTORICAL  09/01/2016   IR US GUIDE VASC ACCESS RIGHT 09/01/2016 Marybelle Killings, MD [facility]  . IR GENERIC HISTORICAL  09/16/2016   IR REMOVAL TUN CV CATH W/O FL 09/16/2016 Arne Cleveland, MD [facility]  . KNEE ARTHROSCOPY Left 08/30/2016   Procedure: ARTHROSCOPY KNEE;  Surgeon: Renette Butters, MD;  Location: [place];  Service: Orthopedics;  Laterality: Left;  . LEFT AND RIGHT HEART CATHETERIZATION WITH CORONARY ANGIOGRAM N/A 02/16/2012   Procedure: LEFT AND RIGHT HEART CATHETERIZATION WITH CORONARY ANGIOGRAM;  Surgeon: Pixie Casino, MD;  [facility]: [facility];  Service: Cardiovascular;  Laterality: N/A;  . TRANSTHORACIC ECHOCARDIOGRAM  08/31/2012   EF 35-40%, no significant wall motion abnormalities, diastolic relaxation abnormality - no longer needed LifeVest; LV cavity size mod dilated with mod conc hypertrophy,  systolic function mod reduced; mild MR, LA mod dilated   Social History   Socioeconomic History  . Marital status: Single    Spouse name: Not on file  . Number of children: 6  . Years of education: 27  . Highest education level: Not on file  Occupational History  . Occupation: Brewing technologist    Comment: [name]  . Occupation: newpaper delivery    Employer: [employer]  Social Needs  . Financial resource strain: Not on file  . Food insecurity:    Worry: Not on file    Inability: Not on file  . Transportation needs:    Medical: Not on file    Non-medical: Not on file  Tobacco Use  . Smoking status: Never Smoker  . Smokeless tobacco: Never Used  Substance and Sexual Activity  . Alcohol use: No  . Drug use: No  . Sexual activity: Yes    Birth control/protection: Condom  Lifestyle  . Physical activity:    Days per week: Not on file    Minutes per session: Not on file  . Stress: Not on file  Relationships  . Social connections:    Talks on phone: Not on file    Gets together: Not on file    Attends religious service: Not on file    Active member of club or organization: Not on file    Attends meetings of clubs or organizations: Not on file    Relationship status: Not on  file  Other Topics Concern  . Not on file  Social History Narrative  . Not on file   Family History  Problem Relation Age of Onset  . Coronary artery disease Brother        CABG at 15, DM  . Heart disease Brother 35       CAD  . Diabetes Mother   . Hypertension Mother   . Hyperlipidemia Mother   . Heart disease Mother   . Diabetes Father   . Hypertension Father    - negative except otherwise stated in the family history section No Known Allergies Prior to Admission medications   Medication Sig Start Date End Date Taking? Authorizing Provider  acetaminophen (TYLENOL) 500 MG tablet Take 1,000 mg by mouth every 6 (six) hours as needed for moderate pain or headache.   Yes  [provider]  aspirin 81 MG tablet Take 81 mg by mouth daily.   Yes [provider]  carvedilol (COREG) 25 MG tablet TAKE 1 TABLET BY MOUTH TWICE DAILY WITH  A  MEAL 08/18/17  Yes [name], [name], MD  hydrALAZINE (APRESOLINE) 100 MG tablet Take 1 tablet (100 mg total) by mouth 3 (three) times daily. 10/11/16  Yes Shirley Friar, PA-C  isosorbide mononitrate (IMDUR) 60 MG 24 hr tablet TAKE 1 & 1/2 (ONE & ONE-HALF) TABLETS BY MOUTH ONCE DAILY 08/18/17  Yes [name], [name], MD  torsemide (DEMADEX) 20 MG tablet TAKE 3 TABLETS BY MOUTH ONCE DAILY 09/18/17  Yes [name], [name], MD  atorvastatin (LIPITOR) 10 MG tablet Take 1 tablet (10 mg total) by mouth daily with supper. Patient not taking: Reported on 11/27/2017 12/22/16   [name], [name], MD  diclofenac sodium (VOLTAREN) 1 % GEL Apply 4 g topically 4 (four) times daily. Patient not taking: Reported on 11/27/2017 12/20/16   [name], [name], MD  ferrous sulfate 325 (65 FE) MG tablet Take 1 tablet (325 mg total) by mouth 2 (two) times daily with a meal. Patient not taking: Reported on 11/27/2017 11/02/16   [name], [name], MD  gabapentin (NEURONTIN) 300 MG capsule Take 1 capsule (300 mg total) by mouth 2 (two) times daily. Patient not taking: Reported on 11/27/2017 12/20/16   [name], [name], MD  glucose blood (FREESTYLE TEST STRIPS) test strip Use as instructed 09/02/16   Jonetta Osgood, MD  glucose monitoring kit (FREESTYLE) monitoring kit 1 each by Does not apply route 4 (four) times daily - after meals and at bedtime. 1 month Diabetic Testing Supplies for QAC-QHS accuchecks. 09/02/16   Ghimire, Henreitta Leber, MD  insulin aspart (NOVOLOG FLEXPEN) 100 UNIT/ML FlexPen 0-15 Units, Subcutaneous, 3 times daily with meals CBG < 70: implement hypoglycemia protocol-call MD CBG 70 - 120: 0 units CBG 121 - 150: 2 units CBG 151 - 200: 3 units CBG 201 - 250: 5 units CBG 251 - 300: 8 units CBG 301 - 350: 11 units CBG 351 - 400: 15 units CBG >  400:Call MD Patient not taking: Reported on 11/27/2017 09/02/16   Jonetta Osgood, MD  Insulin Glargine (LANTUS SOLOSTAR) 100 UNIT/ML Solostar Pen Inject 20 Units into the skin every morning. And pen needles 1/day Patient not taking: Reported on 11/27/2017 12/22/16   [name], [name], MD  Insulin Pen Needle 32G X 8 MM MISC Use as directed 09/02/16   Ghimire, Henreitta Leber, MD  KLOR-CON M20 20 MEQ tablet TAKE 2 TABLETS BY MOUTH ONCE DAILY Patient not taking: Reported on  11/27/2017 07/10/17   Bensimhon, Shaune Pascal, MD  Lancets (FREESTYLE) lancets Use as instructed 09/02/16   Jonetta Osgood, MD  Lidocaine 3.75 % CREA Apply 1 application topically 3 (three) times daily as needed. Patient not taking: Reported on 11/27/2017 12/20/16   [name], [name], MD  pantoprazole (PROTONIX) 40 MG tablet Take 1 tablet (40 mg total) by mouth daily before breakfast. Patient not taking: Reported on 11/27/2017 11/02/16   [name], [name], MD  Vitamin D, Ergocalciferol, (DRISDOL) 50000 units CAPS capsule Take 1 capsule by mouth once weekly for 16 weeks, and then every 2 weeks. Patient not taking: Reported on 11/27/2017 12/22/16   [name], [name], MD   Dg Chest 2 View  Result Date: 11/27/2017 CLINICAL DATA:  Increased fluid buildup.  Weight gain. EXAM: CHEST - 2 VIEW COMPARISON:  08/16/2016 FINDINGS: There is marked enlargement of the cardiac silhouette. No pleural effusion or edema. No airspace opacities identified. The visualized osseous structures appear intact. IMPRESSION: 1. No acute cardiopulmonary abnormalities. 2. Cardiac enlargement as before. Electronically Signed   By: Kerby Moors M.D.   On: 11/27/2017 15:00   Dg Toe Great Right  Result Date: 11/27/2017 CLINICAL DATA:  Foot ulcer. EXAM: RIGHT GREAT TOE COMPARISON:  None. FINDINGS: Three-view exam of the right great toe shows a soft tissue defect compatible with ulcer. No underlying bony destruction or change to suggest osteomyelitis by plain film exam. Soft tissue swelling  is evident. IMPRESSION: No radiographic evidence for osteomyelitis. Electronically Signed   By: Misty Stanley M.D.   On: 11/27/2017 14:57   - pertinent xrays, CT, MRI studies were reviewed and independently interpreted  Positive ROS: All other systems have been reviewed and were otherwise negative with the exception of those mentioned in the HPI and as above.  Physical Exam: General: Alert, no acute distress Psychiatric: Patient is competent for consent with normal mood and affect Lymphatic: No axillary or cervical lymphadenopathy Cardiovascular: No pedal edema Respiratory: No cyanosis, no use of accessory musculature GI: No organomegaly, abdomen is soft and non-tender    Images:  @ENCIMAGES @  Labs:  Lab Results  Component Value Date   HGBA1C 11.5 (H) 11/28/2017   HGBA1C 9.7 (H) 12/20/2016   HGBA1C 11.2 (H) 08/17/2016   REPTSTATUS 08/30/2016 FINAL 08/30/2016   REPTSTATUS 09/01/2016 FINAL 08/30/2016   GRAMSTAIN  08/30/2016    ABUNDANT WBC PRESENT, PREDOMINANTLY PMN RARE GRAM POSITIVE COCCI IN PAIRS    GRAMSTAIN  08/30/2016    GRAM POSITIVE COCCI IN PAIRS IN CHAINS IN BOTH AEROBIC AND ANAEROBIC BOTTLES    CULT (A) 08/30/2016    STREPTOCOCCUS PYOGENES SUSCEPTIBILITIES PERFORMED ON PREVIOUS CULTURE WITHIN THE LAST 5 DAYS. HEALTH DEPARTMENT NOTIFIED    LABORGA GROUP A STREP (S.PYOGENES) ISOLATED 08/29/2016   LABORGA STREPTOCOCCUS SALIVARIUS 08/29/2016    Lab Results  Component Value Date   ALBUMIN 1.8 (L) 11/28/2017   ALBUMIN 1.9 (L) 11/28/2017   ALBUMIN 2.0 (L) 11/27/2017    Neurologic: Patient does not have protective sensation bilateral lower extremities.   MUSCULOSKELETAL:   Skin: Examination patient has brawny skin color changes on both legs he has pitting edema up to the tibial tubercle bilaterally with massive swelling there are venous ulcers with drainage from both legs.  He does have a palpable pulse bilaterally.  Is a [name] grade 1 ulcer on the right  great toe plantar aspect IP joint 2 cm in diameter 3 mm deep this does not probe to bone or tendon.  Radiographs shows  no signs of osteomyelitis.  Review of the radiographs shows no bony involvement.  Assessment: Assessment: Uncontrolled type 2 diabetes with venous insufficiency with ulceration and a Wagener grade 1 ulcer right great toe without osteomyelitis.  Plan: Plan: We will have wound ostomy continence nursing wrap both legs with a Profore wrap.    I will follow-up in the office in 1week to repeat wraps and eventually get the patient into compression stockings.    We will place him in a Darco shoe with Silvadene dressing changes to the right great toe.  Thank you for the consult and the opportunity to see Mr. Nikolaus Pienta, [place] [phone] 10:23 AM

## 2017-11-28 NOTE — Care Management Note (Signed)
Case Management Note  Patient Details  Name: Joshua Massey MRN: [id number] Date of Birth: 1976/01/28  Subjective/Objective:     CHF              Action/Plan: Patient lives at home with his spouse and 2 children ( 14 and 41 yr old); PCP: [name], [name], MD; has private insurance with [employer] with prescription drug coverage; pharmacy of choice is [facility]; patient reports no problem getting his medication; DME - cane and walker at home; his spouse cooks a diet low in sodium and has scales at home; he knows to weigh himself daily; patient could benefit from a Disease Management program; [place] choice offered, pt chose [place]; Dan with [facility] called for arrangements; CM will continue to follow for progression of care.Attending MD please enter the Face to Face for [facility] services in epic.  Expected Discharge Date:     Possibly 12/03/2017             Expected Discharge Plan:  [place]  Discharge planning Services  CM Consult  Choice offered to:  Patient  HH Arranged:  RN, Disease Management [place] Agency:  [place]  Status of Service:  In process, will continue to follow  Sherrilyn Rist [phone] 11/28/2017, 3:31 PM

## 2017-11-28 NOTE — Progress Notes (Signed)
Orthopedic Tech Progress Note Patient Details:  Joshua Massey [birth year] [id number]  Ortho Devices Type of Ortho Device: Darco shoe Ortho Device/Splint Location: RLE Ortho Device/Splint Interventions: Ordered, Application   Post Interventions Patient Tolerated: Well Instructions Provided: Care of device   Braulio Bosch 11/28/2017, 12:45 PM

## 2017-11-28 NOTE — Progress Notes (Addendum)
[facility]                                                                              Patient Demographics  Joshua Massey, is a 42 y.o. male, DOB - April 28, 1976, WNI:[id number]  Admit date - 11/27/2017   Admitting Physician Tiffany Kocher, MD  Outpatient Primary MD for the patient is [name], [name], MD  Outpatient specialists:   LOS - 1  days   Medical records reviewed and are as summarized below:    No chief complaint on file.      Brief summary   Joshua Massey is a 42 y.o. male with h/o chronic systolic CHF, DM2, HTN, CKD stage 3, GERD who has not been seen by a physician in almost a year, who presented to the ED with c/o "extra fluid" and weight gain. Over the past 4-5 weeks pt has gained approx 60 lbs, up to 350 lbs today from his baseline of 290 lb. He has had progressive BLE edema R>L as well, especially over the last week. He denied dyspnea at rest but has had increased DOE for the last 2 weeks and due to the swelling in his legs, he has been unable to ambulate for the last 2 days. He reported no chest pain, orthopnea or palpitations. He reported that he "went off a low sodium diet" approx 2 months ago because he was "just tired" of it. He admitted to eating liberal amounts of salty foods since then. Patient was admitted for further work-up.  Assessment & Plan    Principal Problem: Acute on chronic systolic CHF with volume overload, acute hypoxic respiratory failure, CKD stage III now progressed to stage V, medically noncompliant  -Nephrology consulted, patient was placed on IV furosemide 160 mg every 8 hours -5.3 at the time of admission, trended up to 5.5 -Will follow nephrology recommendations regarding hemodialysis -Obtain 2D echocardiogram  Active Problems: Diabetes mellitus with nephropathy, uncontrolled, insulin-dependent, type II - continue sliding scale insulin -Hemoglobin A1c 11.5, restart Lantus, on low-dose 7 units at bedtime due to  renal insufficiency  Morbid obesity -Patient counseled on diet and weight control -BMI 50.94  Acute on CKD stage III - Likely due to diabetic nephropathy, noncompliance, progressed to CKD stage V, creatinine 5.3 at the time of admission -Nephrology consulted, will follow recommendations regarding hemodialysis  Noncompliance -Patient counseled to be compliant with his medical management  Acute on chronic systolic CHF with nonischemic cardiomyopathy EF 25% -As #1, follow 2D echocardiogram  Hypertension -BP currently soft, continue Imdur, hydralazine -Decrease Coreg  Right lower extremity cellulitis, right great toe diabetic foot ulcer -Continue clindamycin, wound care consulted - Dr Sharol Given consulted    Code Status: Full CODE STATUS DVT Prophylaxis: Heparin subcu Family Communication: Discussed in detail with the patient, all imaging results, lab results explained to the patient   Disposition Plan:  Time Spent in minutes   35 minutes  Procedures:  None Consultants:   Nephrology  Antimicrobials:   IV clindamycin 6/10   Medications  Scheduled Meds: . aspirin EC  81 mg Oral Daily  . carvedilol  12.5 mg Oral BID WC  .  ferrous sulfate  325 mg Oral BID WC  . heparin  5,000 Units Subcutaneous Q8H  . hydrALAZINE  50 mg Oral TID  . insulin aspart  0-20 Units Subcutaneous TID WC  . insulin aspart  0-5 Units Subcutaneous QHS  . isosorbide mononitrate  60 mg Oral Daily  . sodium chloride flush  3 mL Intravenous Q12H   Continuous Infusions: . sodium chloride    . clindamycin (CLEOCIN) IV Stopped (11/28/17 0540)  . furosemide Stopped (11/28/17 9563)   PRN Meds:.sodium chloride, acetaminophen, ondansetron (ZOFRAN) IV, sodium chloride flush   Antibiotics   Anti-infectives (From admission, onward)   Start     Dose/Rate Route Frequency Ordered Stop   11/27/17 2200  clindamycin (CLEOCIN) IVPB 600 mg     600 mg 100 mL/hr over 30 Minutes Intravenous Every 8 hours 11/27/17  2116     11/27/17 1645  clindamycin (CLEOCIN) IVPB 600 mg     600 mg 100 mL/hr over 30 Minutes Intravenous  Once 11/27/17 1637 11/27/17 1722        Subjective:   Joshua Massey was seen and examined today.  States no changes, significant volume overload, patient denies dizziness, abdominal pain, N/V/D/C.  No fevers, cough, chest pain  Objective:   Vitals:   11/27/17 2038 11/28/17 0010 11/28/17 0507 11/28/17 0755  BP: 94/74 90/68 94/67  93/73  Pulse: 81 79 78   Resp:    20  Temp: (!) 97.4 F (36.3 C) (!) 97.5 F (36.4 C) 97.6 F (36.4 C) 98.2 F (36.8 C)  TempSrc: Oral Oral Oral Oral  SpO2: 96% 97% 95% 100%  Weight: (!) 163.2 kg (359 lb 11.2 oz)  (!) 165.6 kg (365 lb 1.3 oz)   Height: 5\' 11"  (1.803 m)       Intake/Output Summary (Last 24 hours) at 11/28/2017 0851 Last data filed at 11/28/2017 0611 Gross per 24 hour  Intake 538 ml  Output 200 ml  Net 338 ml     Wt Readings from Last 3 Encounters:  11/28/17 (!) 165.6 kg (365 lb 1.3 oz)  12/20/16 118.9 kg (262 lb 1 oz)  12/16/16 118.9 kg (262 lb 3.2 oz)     Exam  General: Alert and oriented x 3, NAD  Eyes:   HEENT:    Cardiovascular: S1 S2 auscultated, Regular rate and rhythm.  Respiratory: Diminished breath sounds  Gastrointestinal: Morbidly obese, Soft, nontender, nondistended, + bowel sounds  Ext: 2+  pedal edema bilaterally  Neuro: no new deficits  Musculoskeletal: No digital cyanosis, clubbing  Skin: Chronic changes in the distal lower extremities, right great toe ulcer, 1.5x2cm  Psych: Normal affect and demeanor, alert and oriented x3    Data Reviewed:  I have personally reviewed following labs and imaging studies  Micro Results No results found for this or any previous visit (from the past 240 hour(s)).  Radiology Reports Dg Chest 2 View  Result Date: 11/27/2017 CLINICAL DATA:  Increased fluid buildup.  Weight gain. EXAM: CHEST - 2 VIEW COMPARISON:  08/16/2016 FINDINGS: There is marked  enlargement of the cardiac silhouette. No pleural effusion or edema. No airspace opacities identified. The visualized osseous structures appear intact. IMPRESSION: 1. No acute cardiopulmonary abnormalities. 2. Cardiac enlargement as before. Electronically Signed   By: Kerby Moors M.D.   On: 11/27/2017 15:00   Dg Toe Great Right  Result Date: 11/27/2017 CLINICAL DATA:  Foot ulcer. EXAM: RIGHT GREAT TOE COMPARISON:  None. FINDINGS: Three-view exam of the right great toe shows a  soft tissue defect compatible with ulcer. No underlying bony destruction or change to suggest osteomyelitis by plain film exam. Soft tissue swelling is evident. IMPRESSION: No radiographic evidence for osteomyelitis. Electronically Signed   By: Misty Stanley M.D.   On: 11/27/2017 14:57    Lab Data:  CBC: Recent Labs  Lab 11/27/17 1422  WBC 13.1*  NEUTROABS 11.8*  HGB 11.2*  HCT 35.5*  MCV 88.5  PLT 867   Basic Metabolic Panel: Recent Labs  Lab 11/27/17 1422 11/28/17 0407  NA 136 136  136  K 4.1 4.2  4.2  CL 101 102  105  CO2 23 22  23   GLUCOSE 167* 163*  162*  BUN 63* 72*  73*  CREATININE 5.00* 5.51*  5.39*  CALCIUM 7.5* 7.4*  7.3*  PHOS  --  5.6*   GFR: Estimated Creatinine Clearance: 28.7 mL/min (A) (by C-G formula based on SCr of 5.39 mg/dL (H)). Liver Function Tests: Recent Labs  Lab 11/27/17 1422 11/28/17 0407  AST 69* 58*  ALT 38 37  ALKPHOS 79 72  BILITOT 2.3* 1.9*  PROT 6.2* 5.9*  ALBUMIN 2.0* 1.9*  1.8*   No results for input(s): LIPASE, AMYLASE in the last 168 hours. No results for input(s): AMMONIA in the last 168 hours. Coagulation Profile: No results for input(s): INR, PROTIME in the last 168 hours. Cardiac Enzymes: Recent Labs  Lab 11/27/17 2255 11/28/17 0407  TROPONINI 0.04* 0.10*   BNP (last 3 results) No results for input(s): PROBNP in the last 8760 hours. HbA1C: Recent Labs    11/28/17 0407  HGBA1C 11.5*   CBG: Recent Labs  Lab 11/27/17 1239  11/27/17 2327 11/28/17 0751  GLUCAP 154* 167* 156*   Lipid Profile: No results for input(s): CHOL, HDL, LDLCALC, TRIG, CHOLHDL, LDLDIRECT in the last 72 hours. Thyroid Function Tests: No results for input(s): TSH, T4TOTAL, FREET4, T3FREE, THYROIDAB in the last 72 hours. Anemia Panel: No results for input(s): VITAMINB12, FOLATE, FERRITIN, TIBC, IRON, RETICCTPCT in the last 72 hours. Urine analysis:    Component Value Date/Time   COLORURINE AMBER (A) 08/17/2016 0129   APPEARANCEUR HAZY (A) 08/17/2016 0129   LABSPEC 1.016 08/17/2016 0129   PHURINE 5.0 08/17/2016 0129   GLUCOSEU 50 (A) 08/17/2016 0129   HGBUR SMALL (A) 08/17/2016 0129   BILIRUBINUR NEGATIVE 08/17/2016 0129   KETONESUR NEGATIVE 08/17/2016 0129   PROTEINUR >=300 (A) 08/17/2016 0129   UROBILINOGEN 0.2 09/02/2009 0935   NITRITE NEGATIVE 08/17/2016 0129   LEUKOCYTESUR NEGATIVE 08/17/2016 0129     Hoyt Leanos M.D. [facility] 11/28/2017, 8:51 AM  Pager: [phone] Between 7am to 7pm - call Pager - [phone]  After 7pm go to [email or web address] - password [email or web address]  Call night coverage person covering after 7pm

## 2017-11-28 NOTE — Progress Notes (Signed)
  Echocardiogram 2D Echocardiogram has been performed.  Joshua Massey 11/28/2017, 3:15 PM

## 2017-11-28 NOTE — Consult Note (Signed)
[place] Nurse wound consult note Reason for Consult: RGT neuropathic ulceration Wound type: Neuropathic Pressure Injury POA: N/A Measurement: 1.5cm x 2.2cm x 0.5cm Wound bed:red, dry Drainage (amount, consistency, odor) none Periwound:maceration with surrounding callus Dressing procedure/placement/frequency: I have provided Nursing with conservative care orders for the care of the plantar aspect of the toe wound. Suggest referral to either Orthopedics (Dr. Sharol Given) or to the outpatient wound care center of the patient's choosing for continuing care and follow up of this ulcer.  Patient requires non urgent debridement and paring of the callus, off-loading foot wear and orthotics in addition to diabetes counseling. If you agree, please order/arrange for consult/referral.  [place] nursing team will not follow, but will remain available to this patient, the nursing and medical teams.  Please re-consult if needed. Thanks, Maudie Flakes, MSN, RN, [place], Serita Grammes, [place]  Pager# [phone].

## 2017-11-28 NOTE — Consult Note (Signed)
[place] Nurse wound consult note Reason for Consult: Reconsulted post Dr. Jess Barters assessment and development of POC.  Bilateral Unna's boots placed. Wound care to RGT full thickness ulceration to be with BID silver sulfadiazine. Staff Nurses to perform that wound care. Dressing procedure/placement/frequency: WOC RN will apply Profore bandaging system bilaterally weekly on Tuesdays until patient is discharged.  To be followed by Dr. Sharol Given as an outpatient for boot replacement and eventually, compression hosiery.  [place] nursing team will follow weekly, and will remain available to this patient, the nursing and medical teams.  Please re-consult if needed. Thanks, Maudie Flakes, MSN, RN, [place], Arther Abbott  Pager# [phone]

## 2017-11-28 NOTE — Progress Notes (Signed)
Verbal Telephone from from [place] MD for [place] every 6 hours as needed for moderate pain, order entered Neta Mends RN 5:03 PM 11-28-2017

## 2017-11-28 NOTE — Progress Notes (Signed)
Inpatient Diabetes Program Recommendations  AACE/ADA: New Consensus Statement on Inpatient Glycemic Control (2015)  Target Ranges:  Prepandial:   less than 140 mg/dL      Peak postprandial:   less than 180 mg/dL (1-2 hours)      Critically ill patients:  140 - 180 mg/dL   Results for JHASE, CREPPEL (MRN [id number]) as of 11/28/2017 15:00  Ref. Range 11/27/2017 12:39 11/27/2017 23:27 11/28/2017 07:51 11/28/2017 11:43  Glucose-Capillary Latest Ref Range: 65 - 99 mg/dL 154 (H) 167 (H) 156 (H) 157 (H)  Results for MCLAIN, FREER (MRN [id number]) as of 11/28/2017 15:00  Ref. Range 11/28/2017 04:07  Hemoglobin A1C Latest Ref Range: 4.8 - 5.6 % 11.5 (H)   Review of Glycemic Control  Diabetes history: DM2 Outpatient Diabetes medications: Lantus 20 units QAM, Novolog 0-15 units TID with meals (per home med list is not taking Lantus or Novolog) Current orders for Inpatient glycemic control: Novolog 0-20 units TID with meals, Novolog 0-5 units QHS, Lantus 7 units QHS  Inpatient Diabetes Program Recommendations: HgbA1C: A1C 11.5% on 11/28/2017 indicating an average glucose of 283 mg/dl over the past 2-3 months.  NOTE: Diabetes Coordinator working from [employer]. Called patient's room and no answer. Called Financial controller and was informed patient is off unit for Echo. Will plan to have diabetes coordinator follow up with patient tomorrow to discuss A1C and DM control.  Thanks, Barnie Alderman, RN, MSN, CDE Diabetes Coordinator Inpatient Diabetes Program [phone] (Team Pager from 8am to 5pm)

## 2017-11-29 LAB — GLUCOSE, CAPILLARY
Glucose-Capillary: 96 mg/dL (ref 65–99)
Glucose-Capillary: 99 mg/dL (ref 65–99)
Glucose-Capillary: 108 mg/dL — ABNORMAL HIGH (ref 65–99)
Glucose-Capillary: 110 mg/dL — ABNORMAL HIGH (ref 65–99)

## 2017-11-29 LAB — BASIC METABOLIC PANEL
Anion gap: 12 (ref 5–15)
BUN: 79 mg/dL — AB (ref 6–20)
CHLORIDE: 103 mmol/L (ref 101–111)
CO2: 23 mmol/L (ref 22–32)
CREATININE: 5.32 mg/dL — AB (ref 0.61–1.24)
Calcium: 7.7 mg/dL — ABNORMAL LOW (ref 8.9–10.3)
GFR calc Af Amer: 14 mL/min — ABNORMAL LOW (ref >60)
GFR calc non Af Amer: 12 mL/min — ABNORMAL LOW (ref >60)
GLUCOSE: 94 mg/dL (ref 65–99)
Potassium: 3.9 mmol/L (ref 3.5–5.1)
SODIUM: 138 mmol/L (ref 135–145)

## 2017-11-29 MED ORDER — DIPHENHYDRAMINE HCL 25 MG PO CAPS
25.0000 mg | ORAL_CAPSULE | Freq: Once | ORAL | Status: AC
  Administered 2017-11-29: 25 mg via ORAL
  Filled 2017-11-29: qty 1

## 2017-11-29 MED ORDER — METOLAZONE 5 MG PO TABS
5.0000 mg | ORAL_TABLET | Freq: Every day | ORAL | Status: DC
  Administered 2017-11-29 – 2017-12-06 (×8): 5 mg via ORAL
  Filled 2017-11-29 (×8): qty 1

## 2017-11-29 MED ORDER — HYDRALAZINE HCL 10 MG PO TABS
10.0000 mg | ORAL_TABLET | Freq: Three times a day (TID) | ORAL | Status: DC
  Administered 2017-11-29 – 2017-12-01 (×5): 10 mg via ORAL
  Filled 2017-11-29 (×7): qty 1

## 2017-11-29 MED ORDER — INSULIN GLARGINE 100 UNIT/ML ~~LOC~~ SOLN
5.0000 [IU] | Freq: Every day | SUBCUTANEOUS | Status: DC
  Administered 2017-11-29 – 2017-12-06 (×7): 5 [IU] via SUBCUTANEOUS
  Filled 2017-11-29 (×9): qty 0.05

## 2017-11-29 NOTE — Plan of Care (Signed)
  Problem: Health Behavior/Discharge Planning: Goal: Ability to manage health-related needs will improve Outcome: Progressing   Problem: Activity: Goal: Risk for activity intolerance will decrease Outcome: Progressing   Problem: Nutrition: Goal: Adequate nutrition will be maintained Outcome: Progressing   Problem: Coping: Goal: Level of anxiety will decrease Outcome: Progressing   

## 2017-11-29 NOTE — Progress Notes (Signed)
Physical Therapy Treatment Patient Details Name: Joshua Massey MRN: [id number] DOB: 1976/03/01 Today's Date: 11/29/2017    History of Present Illness 42 y.o. male with h/o chronic systolic CHF, DM2, HTN, CKD stage 3, GERD who has not been seen by a physician in almost a year, who presented to the ED with c/o "extra fluid" and weight gain    PT Comments    Nursing requesting PT assistance with transferring patient back into bed. Patient with more fatigue from prior PT session and unable to tolerate standing, despite several attempts with max A and use of BUE support to power up. Pt reports he is feeling tired and stiff from sitting in chair for several hours. Maximove eventually utilized to safely transfer patient back into bed. Will need to demonstrate consistent progress next PT visit in order to safely return home.   Follow Up Recommendations  Home health PT;Supervision for mobility/OOB     Equipment Recommendations  (bari cane)    Recommendations for Other Services       Precautions / Restrictions Precautions Precautions: Fall    Mobility  Bed Mobility                  Transfers Overall transfer level: Needs assistance   Transfers: Sit to/from Stand Sit to Stand: Max assist         General transfer comment: several trials attemtped standing from bedside droparm chair. utilizing BED rails,2 person max A, pt unable to tolerate standing and collpasing back into chair. eventualyl had to use maximove to return to bed.   Ambulation/Gait             General Gait Details: unable this visit due to leg weakness/fatigue    Stairs             Wheelchair Mobility    Modified Rankin (Stroke Patients Only)       Balance Overall balance assessment: Needs assistance   Sitting balance-Leahy Scale: Fair       Standing balance-Leahy Scale: Poor                              Cognition Arousal/Alertness: Awake/alert Behavior During  Therapy: WFL for tasks assessed/performed Overall Cognitive Status: Within Functional Limits for tasks assessed                                        Exercises      General Comments        Pertinent Vitals/Pain Pain Assessment: 0-10 Pain Score: 4  Pain Location: groin and back Pain Descriptors / Indicators: Discomfort Pain Intervention(s): Limited activity within patient's tolerance;Monitored during session    Home Living                      Prior Function            PT Goals (current goals can now be found in the care plan section) Acute Rehab PT Goals Patient Stated Goal: to go home PT Goal Formulation: With patient Time For Goal Achievement: 12/13/17 Potential to Achieve Goals: Good Progress towards PT goals: Progressing toward goals    Frequency    Min 3X/week      PT Plan Current plan remains appropriate    Co-evaluation  AM-PAC PT "6 Clicks" Daily Activity  Outcome Measure  Difficulty turning over in bed (including adjusting bedclothes, sheets and blankets)?: Unable Difficulty moving from lying on back to sitting on the side of the bed? : Unable Difficulty sitting down on and standing up from a chair with arms (e.g., wheelchair, bedside commode, etc,.)?: Unable Help needed moving to and from a bed to chair (including a wheelchair)?: A Lot Help needed walking in hospital room?: Total Help needed climbing 3-5 steps with a railing? : Total 6 Click Score: 7    End of Session Equipment Utilized During Treatment: Gait belt Activity Tolerance: Patient limited by fatigue Patient left: in bed Nurse Communication: Mobility status PT Visit Diagnosis: Muscle weakness (generalized) (M62.81);Unsteadiness on feet (R26.81);Difficulty in walking, not elsewhere classified (R26.2)     Time: 1916-6060 PT Time Calculation (min) (ACUTE ONLY): 20 min  Charges:  $Therapeutic Activity: 8-22 mins                    G  Codes:       Reinaldo Berber, PT, DPT Acute Rehab Services Pager: [phone]     Reinaldo Berber 11/29/2017, 8:54 PM

## 2017-11-29 NOTE — Progress Notes (Signed)
Admit: 11/27/2017 LOS: 2  66M with progressive CKD5 from DN; severe hypervolemia esp LEE; NICM sCHF, and lack of recent medical care  Subjective:   Serum creatinine has held stable at 5.3 with a potassium of 3.9.  Has not diuresed very well, was net +0.7 L yesterday  Currently receiving furosemide 160 mill grams IV every 6 hours  Discussed at length that patient likely to need dialysis, very possible prior to discharge; he is familiar with dialysis with his family history; made a very strong recommendation for immediate pursuit of AV fistula which she was on willing to agree to  He denies nausea, vomiting, anorexia, dysgeusia, hiccups, itching  Seen by Sharol Given for wound on 1st toe  06/11 0701 - 06/12 0700 In: 1559.7 [P.O.:1320; IV Piggyback:239.7] Out: 800 [Urine:800]  Filed Weights   11/28/17 0507 11/29/17 0532 11/29/17 1203  Weight: (!) 165.6 kg (365 lb 1.3 oz) (!) 166.3 kg (366 lb 10 oz) (!) 163.2 kg (359 lb 11.2 oz)    Scheduled Meds: . aspirin EC  81 mg Oral Daily  . carvedilol  12.5 mg Oral BID WC  . ferrous sulfate  325 mg Oral BID WC  . heparin  5,000 Units Subcutaneous Q8H  . hydrALAZINE  10 mg Oral TID  . insulin aspart  0-20 Units Subcutaneous TID WC  . insulin aspart  0-5 Units Subcutaneous QHS  . insulin glargine  5 Units Subcutaneous QHS  . silver sulfADIAZINE   Topical Daily  . sodium chloride flush  3 mL Intravenous Q12H   Continuous Infusions: . sodium chloride    . clindamycin (CLEOCIN) IV Stopped (11/29/17 0545)  . furosemide Stopped (11/29/17 1129)   PRN Meds:.sodium chloride, acetaminophen, ondansetron (ZOFRAN) IV, oxyCODONE-acetaminophen, sodium chloride flush  Current Labs: reviewed    Physical Exam:  Blood pressure (!) 126/92, pulse 78, temperature 97.6 F (36.4 C), temperature source Oral, resp. rate 18, height 5\' 11"  (1.803 m), weight (!) 163.2 kg (359 lb 11.2 oz), SpO2 97 %. NAD, MOrbidly obese RRR w/o mgr nl s1s2 CTAB Massive LEE, tight,  into proximal legs Distal legs wrapped Wound on r 1st toe No asterixus  A 1. CKD5, Massive Hypervolemia 2. +Fhx of ESRD and Dialysis (father, brother) 53. Chronic sCHF 4. Morbid Obesity 5. Venous stasis ulcer on R toe 6. Poor response to diuretic 7. DM2 uncontrolled A1c 11.5%  P 1. Add metolazone 5mg /day 2. I think he probably needs dialysis for hypervolemia and poor response to diuretics; he is uninterested at the current time 3. I also think he should pursue vascular access very soon, during his admission.  He does not wish to do so at the current time, we will continue to discuss with him. 4. Daily weights, Daily Renal Panel, Strict I/Os, Avoid nephrotoxins (NSAIDs, judicious IV Contrast) 5. Medication Issues; Preferred narcotic agents for pain control are hydromorphone, fentanyl, and methadone. Morphine should not be used. Oxydone is not preferred.  Baclofen should not be used. Avoid Fleets and Magnesium Citrate Bowel Preps    Pearson Grippe MD 11/29/2017, 2:50 PM  Recent Labs  Lab 11/27/17 1422 11/28/17 0407 11/29/17 0802  NA 136 136  136 138  K 4.1 4.2  4.2 3.9  CL 101 102  105 103  CO2 23 22  23 23   GLUCOSE 167* 163*  162* 94  BUN 63* 72*  73* 79*  CREATININE 5.00* 5.51*  5.39* 5.32*  CALCIUM 7.5* 7.4*  7.3* 7.7*  PHOS  --  5.6*  --  Recent Labs  Lab 11/27/17 1422  WBC 13.1*  NEUTROABS 11.8*  HGB 11.2*  HCT 35.5*  MCV 88.5  PLT 205

## 2017-11-29 NOTE — Progress Notes (Signed)
[facility]                                                                              Patient Demographics  Joshua Massey, is a 42 y.o. male, DOB - 1976-02-20, XBJ:[id number]  Admit date - 11/27/2017   Admitting Physician Janora Norlander, MD  Outpatient Primary MD for the patient is [name], [name], MD  Outpatient specialists:   LOS - 2  days   Medical records reviewed and are as summarized below:    No chief complaint on file.      Brief summary   Joshua Massey is a 42 y.o. male with h/o chronic systolic CHF, DM2, HTN, CKD stage 3, GERD who has not been seen by a physician in almost a year, who presented to the ED with c/o "extra fluid" and weight gain. Over the past 4-5 weeks pt has gained approx 60 lbs, up to 350 lbs today from his baseline of 290 lb. He has had progressive BLE edema R>L as well, especially over the last week. He denied dyspnea at rest but has had increased DOE for the last 2 weeks and due to the swelling in his legs, he has been unable to ambulate for the last 2 days. He reported no chest pain, orthopnea or palpitations. He reported that he "went off a low sodium diet" approx 2 months ago because he was "just tired" of it. He admitted to eating liberal amounts of salty foods since then. Patient was admitted for further work-up.  Assessment & Plan   AKI on CKD3/acute on chronic systolic CHF -EF is 30-86% down from 25% last year, nonischemic cardiomyopathy-has not followed at the heart failure clinic in over one year -Volume overloaded -Nephrology following, on high-dose IV Lasix 160 mg every 6 without significant diuresis,  -lack of response to diuretics is concerning -creatinine in the 5-5.5 range, previous baseline was 2.6 in 12/2016  Acute on chronic systolic CHF -previously EF was 25%, now dropped to 15-20% -Per cardiology has an ICM, had normal coronaries in 2015 -continue Coreg, hold Imdur with soft BPs  Diabetes mellitus with  nephropathy, uncontrolled, insulin-dependent, type II -Hemoglobin A1c 11.5,  -CBGs lower, cutdown Lantus dose, but requires last dose now due to renal failure  Morbid obesity -Patient counseled on diet and weight control -BMI 50.94  Noncompliance -significant challenge in this complex patient with multisystem failure and uncontrolled diabetes  Hypertension -BP currently soft, -continue Coreg, I have discontinued Imdur and cutdown hydralazine  Right lower extremity cellulitis, right great toe diabetic foot ulcer -Continue clindamycin, wound care consulted - Dr Sharol Given consulted, local care and follow-up recommended -Change clindamycin to oral Keflex tomorrow  Code Status: Full CODE STATUS DVT Prophylaxis: Heparin subcu Family Communication: Discussed in detail with the patient, all imaging results, lab results explained to the patient   Disposition Plan:  Time Spent in minutes   35 minutes  Procedures:  None Consultants:   Nephrology Orthopedics Dr. Sharol Given  Antimicrobials:   IV clindamycin 6/10   Medications  Scheduled Meds: . aspirin EC  81 mg Oral Daily  . carvedilol  12.5 mg Oral  BID WC  . ferrous sulfate  325 mg Oral BID WC  . heparin  5,000 Units Subcutaneous Q8H  . hydrALAZINE  50 mg Oral TID  . insulin aspart  0-20 Units Subcutaneous TID WC  . insulin aspart  0-5 Units Subcutaneous QHS  . insulin glargine  7 Units Subcutaneous QHS  . isosorbide mononitrate  60 mg Oral Daily  . silver sulfADIAZINE   Topical Daily  . sodium chloride flush  3 mL Intravenous Q12H   Continuous Infusions: . sodium chloride    . clindamycin (CLEOCIN) IV Stopped (11/29/17 0545)  . furosemide Stopped (11/29/17 1129)   PRN Meds:.sodium chloride, acetaminophen, ondansetron (ZOFRAN) IV, oxyCODONE-acetaminophen, sodium chloride flush   Antibiotics   Anti-infectives (From admission, onward)   Start     Dose/Rate Route Frequency Ordered Stop   11/27/17 2200  clindamycin (CLEOCIN)  IVPB 600 mg     600 mg 100 mL/hr over 30 Minutes Intravenous Every 8 hours 11/27/17 2116     11/27/17 1645  clindamycin (CLEOCIN) IVPB 600 mg     600 mg 100 mL/hr over 30 Minutes Intravenous  Once 11/27/17 1637 11/27/17 1722        Subjective:   -reports that his breathing is slowly improving, has not gotten out of bed yet,  Objective:   Vitals:   11/28/17 1520 11/28/17 1935 11/29/17 0532 11/29/17 1203  BP: 101/72 103/70 115/77 (!) 126/92  Pulse: 80 82 79 78  Resp: 20   18  Temp: 99.7 F (37.6 C) 98.4 F (36.9 C) 97.8 F (36.6 C) 97.6 F (36.4 C)  TempSrc: Oral Oral Oral Oral  SpO2: 96% 94% 96% 97%  Weight:   (!) 166.3 kg (366 lb 10 oz)   Height:        Intake/Output Summary (Last 24 hours) at 11/29/2017 1303 Last data filed at 11/29/2017 0900 Gross per 24 hour  Intake 1439.7 ml  Output 800 ml  Net 639.7 ml     Wt Readings from Last 3 Encounters:  11/29/17 (!) 166.3 kg (366 lb 10 oz)  12/20/16 118.9 kg (262 lb 1 oz)  12/16/16 118.9 kg (262 lb 3.2 oz)     Exam Gen: Awake, Alert, Oriented X 3, chronically ill-appearing male, morbidly obese HEENT: PERRLA, Neck supple, no JVD Lungs: decreased breath sounds at both bases CVS: RRR,No Gallops,Rubs or new Murmurs Abd: soft, Non tender, non distended, BS present GU: scrotal swelling Extremities: ACE wrap on both lower legs Psych: Normal affect and demeanor, alert and oriented x3    Data Reviewed:  I have personally reviewed following labs and imaging studies  Micro Results Recent Results (from the past 240 hour(s))  Culture, blood (routine x 2)     Status: None (Preliminary result)   Collection Time: 11/28/17 12:46 AM  Result Value Ref Range Status   Specimen Description BLOOD RIGHT ANTECUBITAL  Final   Special Requests   Final    BOTTLES DRAWN AEROBIC AND ANAEROBIC Blood Culture adequate volume   Culture   Final    NO GROWTH 1 DAY Performed at [facility], [address], [place], [place]  [address]    Report Status PENDING  Incomplete  Culture, blood (routine x 2)     Status: None (Preliminary result)   Collection Time: 11/28/17 12:46 AM  Result Value Ref Range Status   Specimen Description BLOOD RIGHT ANTECUBITAL  Final   Special Requests   Final    BOTTLES DRAWN AEROBIC AND ANAEROBIC  Blood Culture adequate volume   Culture   Final    NO GROWTH 1 DAY Performed at [facility] [address], [place], [place] [address]    Report Status PENDING  Incomplete    Radiology Reports Dg Chest 2 View  Result Date: 11/27/2017 CLINICAL DATA:  Increased fluid buildup.  Weight gain. EXAM: CHEST - 2 VIEW COMPARISON:  08/16/2016 FINDINGS: There is marked enlargement of the cardiac silhouette. No pleural effusion or edema. No airspace opacities identified. The visualized osseous structures appear intact. IMPRESSION: 1. No acute cardiopulmonary abnormalities. 2. Cardiac enlargement as before. Electronically Signed   By: Kerby Moors M.D.   On: 11/27/2017 15:00   Dg Toe Great Right  Result Date: 11/27/2017 CLINICAL DATA:  Foot ulcer. EXAM: RIGHT GREAT TOE COMPARISON:  None. FINDINGS: Three-view exam of the right great toe shows a soft tissue defect compatible with ulcer. No underlying bony destruction or change to suggest osteomyelitis by plain film exam. Soft tissue swelling is evident. IMPRESSION: No radiographic evidence for osteomyelitis. Electronically Signed   By: Misty Stanley M.D.   On: 11/27/2017 14:57    Lab Data:  CBC: Recent Labs  Lab 11/27/17 1422  WBC 13.1*  NEUTROABS 11.8*  HGB 11.2*  HCT 35.5*  MCV 88.5  PLT 993   Basic Metabolic Panel: Recent Labs  Lab 11/27/17 1422 11/28/17 0407 11/29/17 0802  NA 136 136  136 138  K 4.1 4.2  4.2 3.9  CL 101 102  105 103  CO2 23 22  23 23   GLUCOSE 167* 163*  162* 94  BUN 63* 72*  73* 79*  CREATININE 5.00* 5.51*  5.39* 5.32*  CALCIUM 7.5* 7.4*  7.3* 7.7*  PHOS  --  5.6*  --    GFR: Estimated  Creatinine Clearance: 29.2 mL/min (A) (by C-G formula based on SCr of 5.32 mg/dL (H)). Liver Function Tests: Recent Labs  Lab 11/27/17 1422 11/28/17 0407  AST 69* 58*  ALT 38 37  ALKPHOS 79 72  BILITOT 2.3* 1.9*  PROT 6.2* 5.9*  ALBUMIN 2.0* 1.9*  1.8*   No results for input(s): LIPASE, AMYLASE in the last 168 hours. No results for input(s): AMMONIA in the last 168 hours. Coagulation Profile: No results for input(s): INR, PROTIME in the last 168 hours. Cardiac Enzymes: Recent Labs  Lab 11/27/17 2255 11/28/17 0407  TROPONINI 0.04* 0.10*   BNP (last 3 results) No results for input(s): PROBNP in the last 8760 hours. HbA1C: Recent Labs    11/28/17 0407  HGBA1C 11.5*   CBG: Recent Labs  Lab 11/28/17 0751 11/28/17 1143 11/28/17 2110 11/29/17 0720 11/29/17 1202  GLUCAP 156* 157* 128* 96 99   Lipid Profile: No results for input(s): CHOL, HDL, LDLCALC, TRIG, CHOLHDL, LDLDIRECT in the last 72 hours. Thyroid Function Tests: No results for input(s): TSH, T4TOTAL, FREET4, T3FREE, THYROIDAB in the last 72 hours. Anemia Panel: No results for input(s): VITAMINB12, FOLATE, FERRITIN, TIBC, IRON, RETICCTPCT in the last 72 hours. Urine analysis:    Component Value Date/Time   COLORURINE AMBER (A) 08/17/2016 0129   APPEARANCEUR HAZY (A) 08/17/2016 0129   LABSPEC 1.016 08/17/2016 0129   PHURINE 5.0 08/17/2016 0129   GLUCOSEU 50 (A) 08/17/2016 0129   HGBUR SMALL (A) 08/17/2016 0129   BILIRUBINUR NEGATIVE 08/17/2016 0129   KETONESUR NEGATIVE 08/17/2016 0129   PROTEINUR >=300 (A) 08/17/2016 0129   UROBILINOGEN 0.2 09/02/2009 0935   NITRITE NEGATIVE 08/17/2016 0129   LEUKOCYTESUR NEGATIVE 08/17/2016 0129  Domenic Polite M.D. [facility] 11/29/2017, 1:03 PM  Page via [email or web address] - password [email or web address]  Call night coverage person covering after 7pm

## 2017-11-29 NOTE — Evaluation (Signed)
Physical Therapy Evaluation Patient Details Name: Joshua Massey MRN: [id number] DOB: [birth year] Today's Date: 11/29/2017   History of Present Illness  42 y.o. male with h/o chronic systolic CHF, DM2, HTN, CKD stage 3, GERD who has not been seen by a physician in almost a year, who presented to the ED with c/o "extra fluid" and weight gain  Clinical Impression  Orders received for PT evaluation. Patient demonstrates deficits in functional mobility as indicated below. Will benefit from continued skilled PT to address deficits and maximize function. Will see as indicated and progress as tolerated.      Follow Up Recommendations Home health PT;Supervision for mobility/OOB    Equipment Recommendations  Cane(bariatric)    Recommendations for Other Services       Precautions / Restrictions Precautions Precautions: Fall      Mobility  Bed Mobility Overal bed mobility: Modified Independent             General bed mobility comments: increased time and effort to perform  Transfers Overall transfer level: Needs assistance Equipment used: 2 person hand held assist(bilateral UE support) Transfers: Sit to/from Stand Sit to Stand: Min assist;From elevated surface         General transfer comment: Bed height significantly elevated to come to standing  Ambulation/Gait Ambulation/Gait assistance: Min assist Ambulation Distance (Feet): 16 Feet Assistive device: 1 person hand held assist Gait Pattern/deviations: Wide base of support;Antalgic Gait velocity: decreasd Gait velocity interpretation: <1.31 ft/sec, indicative of household ambulator General Gait Details: wide waddling gait, limited by LE edema. Reliance on UE support  Stairs            Wheelchair Mobility    Modified Rankin (Stroke Patients Only)       Balance Overall balance assessment: Needs assistance   Sitting balance-Leahy Scale: Fair Sitting balance - Comments: able to sit EOb without support    Standing balance support: During functional activity Standing balance-Leahy Scale: Poor Standing balance comment: reliance on UE support                             Pertinent Vitals/Pain Pain Assessment: 0-10 Pain Score: 4  Pain Location: groin and back Pain Descriptors / Indicators: Discomfort Pain Intervention(s): Monitored during session    Home Living Family/patient expects to be discharged to:: Private residence Living Arrangements: Spouse/significant other Available Help at Discharge: Family Type of Home: Apartment Home Access: Stairs to enter Entrance Stairs-Rails: Psychiatric nurse of Steps: 2 Home Layout: One level Home Equipment: None      Prior Function Level of Independence: Independent               Hand Dominance   Dominant Hand: Right    Extremity/Trunk Assessment   Upper Extremity Assessment Upper Extremity Assessment: Generalized weakness    Lower Extremity Assessment Lower Extremity Assessment: Generalized weakness(limited assessment due to edema)    Cervical / Trunk Assessment Cervical / Trunk Assessment: (increased body habitus)  Communication   Communication: No difficulties  Cognition Arousal/Alertness: Awake/alert Behavior During Therapy: WFL for tasks assessed/performed Overall Cognitive Status: Within Functional Limits for tasks assessed                                        General Comments      Exercises     Assessment/Plan    PT Assessment Patient  needs continued PT services  PT Problem List Decreased strength;Decreased range of motion;Decreased activity tolerance;Decreased balance;Decreased mobility;Decreased safety awareness;Obesity;Pain;Cardiopulmonary status limiting activity       PT Treatment Interventions DME instruction;Gait training;Stair training;Functional mobility training;Balance training;Therapeutic exercise;Therapeutic activities;Neuromuscular  re-education;Patient/family education    PT Goals (Current goals can be found in the Care Plan section)  Acute Rehab PT Goals Patient Stated Goal: to go home PT Goal Formulation: With patient Time For Goal Achievement: 12/13/17 Potential to Achieve Goals: Good    Frequency Min 3X/week   Barriers to discharge        Co-evaluation PT/OT/SLP Co-Evaluation/Treatment: (may benefit from chair follow)             AM-PAC PT "6 Clicks" Daily Activity  Outcome Measure Difficulty turning over in bed (including adjusting bedclothes, sheets and blankets)?: A Lot Difficulty moving from lying on back to sitting on the side of the bed? : A Lot Difficulty sitting down on and standing up from a chair with arms (e.g., wheelchair, bedside commode, etc,.)?: Unable Help needed moving to and from a bed to chair (including a wheelchair)?: A Little Help needed walking in hospital room?: A Little Help needed climbing 3-5 steps with a railing? : A Lot 6 Click Score: 13    End of Session   Activity Tolerance: Patient limited by fatigue Patient left: in chair;with call bell/phone within reach Nurse Communication: Mobility status PT Visit Diagnosis: Muscle weakness (generalized) (M62.81);Unsteadiness on feet (R26.81);Difficulty in walking, not elsewhere classified (R26.2)    Time: 3005-1102 PT Time Calculation (min) (ACUTE ONLY): 20 min   Charges:   PT Evaluation $PT Eval Moderate Complexity: 1 Mod     PT G Codes:        Alben Deeds, PT DPT  Board Certified Neurologic Specialist [place] 11/29/2017, 3:38 PM

## 2017-11-30 LAB — GLUCOSE, CAPILLARY
Glucose-Capillary: 109 mg/dL — ABNORMAL HIGH (ref 65–99)
Glucose-Capillary: 104 mg/dL — ABNORMAL HIGH (ref 65–99)
Glucose-Capillary: 99 mg/dL (ref 65–99)
Glucose-Capillary: 109 mg/dL — ABNORMAL HIGH (ref 65–99)

## 2017-11-30 MED ORDER — CEPHALEXIN 250 MG PO CAPS
250.0000 mg | ORAL_CAPSULE | Freq: Two times a day (BID) | ORAL | Status: DC
  Administered 2017-11-30 – 2017-12-04 (×8): 250 mg via ORAL
  Filled 2017-11-30 (×8): qty 1

## 2017-11-30 NOTE — Progress Notes (Signed)
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  I have expressed strong doubt that this will occur and asked him to reconsider  06/12 0701 - 06/13 0700 In: 1210 [P.O.:960] Out: 400 [Urine:400]  Filed Weights   11/29/17 0532 11/29/17 1203 11/30/17 0600  Weight: (!) 166.3 kg (366 lb 10 oz) (!) 163.2 kg (359 lb 11.2 oz) (!) 162.8 kg (358 lb 12.8 oz)    Scheduled Meds: . aspirin EC  81 mg Oral Daily  . carvedilol  12.5 mg Oral BID WC  . ferrous sulfate  325 mg Oral BID WC  . heparin  5,000 Units Subcutaneous Q8H  . hydrALAZINE  10 mg Oral TID  . insulin aspart  0-20 Units Subcutaneous TID WC  . insulin aspart  0-5 Units Subcutaneous QHS  . insulin glargine  5 Units Subcutaneous QHS  . metolazone  5 mg Oral Daily  . silver sulfADIAZINE   Topical Daily  . sodium chloride flush  3 mL Intravenous Q12H   Continuous Infusions: . sodium chloride    . clindamycin (CLEOCIN) IV 600 mg (11/30/17 0559)  . furosemide Stopped (11/30/17 0710)   PRN Meds:.sodium chloride, acetaminophen, ondansetron (ZOFRAN) IV, oxyCODONE-acetaminophen, sodium chloride flush  Current Labs: reviewed  6/11 TTE LVEF 15 to 20% with biventricular failure   Physical Exam:  Blood pressure 118/85, pulse 83, temperature 98 F (36.7 C), temperature source Oral, resp. rate 20, height 5\' 11"  (1.803 m), weight (!) 162.8 kg (358 lb 12.8 oz), SpO2  97 %. NAD, MOrbidly obese RRR w/o mgr nl s1s2 CTAB Massive LEE, tight, into proximal legs Distal legs wrapped Wound on r 1st toe No asterixus  A 1. CKD5, Massive Hypervolemia and not responsive to high dose diuretics 2. +Fhx of ESRD and Dialysis (father, brother) 3. Chronic sCHF, binventricular; decompensated 4. Morbid Obesity 5. Venous stasis ulcer on R toe 6. DM2 uncontrolled A1c 11.5%  P 1. Have made strong recommendation for immediate initiation of hemodialysis given hypervolemia and being diuretic refractory; he is not willing to do so.   2. I hope he just needs time to process and will change his mind 3. We will continue to discuss and make this recommendation to him, have also spoken with Dr. Broadus John, hospitalist 4. Continue furosemide and metolazone 5mg /day 5. Daily weights, Daily Renal Panel, Strict I/Os, Avoid nephrotoxins (NSAIDs, judicious IV Contrast) 6. Medication Issues; Preferred narcotic agents for pain control are hydromorphone, fentanyl, and methadone. Morphine should not be used. Oxydone is not preferred.  Baclofen should not be used. Avoid Fleets and Magnesium Citrate Bowel Preps    Pearson Grippe MD 11/30/2017, 8:49 AM  Recent Labs  Lab 11/27/17 1422 11/28/17 0407 11/29/17 0802  NA 136 136  136 138  K 4.1 4.2  4.2 3.9  CL 101 102  105 103  CO2 23 22  23 23   GLUCOSE 167* 163*  162*  94  BUN 63* 72*  73* 79*  CREATININE 5.00* 5.51*  5.39* 5.32*  CALCIUM 7.5* 7.4*  7.3* 7.7*  PHOS  --  5.6*  --    Recent Labs  Lab 11/27/17 1422  WBC 13.1*  NEUTROABS 11.8*  HGB 11.2*  HCT 35.5*  MCV 88.5  PLT 205

## 2017-11-30 NOTE — Plan of Care (Signed)
  Problem: Pain Managment: Goal: General experience of comfort will improve Outcome: Progressing   

## 2017-11-30 NOTE — Progress Notes (Signed)
Pharmacist Heart Failure Core Measure Documentation  Assessment: Joshua Massey has an EF documented as 15-20% on 11/28/17 by echo.  Rationale: Heart failure patients with left ventricular systolic dysfunction (LVSD) and an EF < 40% should be prescribed an angiotensin converting enzyme inhibitor (ACEI) or angiotensin receptor blocker (ARB) at discharge unless a contraindication is documented in the medical record.  This patient is not currently on an ACEI or ARB for HF.  This note is being placed in the record in order to provide documentation that a contraindication to the use of these agents is present for this encounter.  ACE Inhibitor or Angiotensin Receptor Blocker is contraindicated (specify all that apply)  []   ACEI allergy AND ARB allergy []   Angioedema []   Moderate or severe aortic stenosis []   Hyperkalemia []   Hypotension []   Renal artery stenosis [x]   Worsening renal function, preexisting renal disease or dysfunction   Renold Genta, PharmD, BCPS Clinical Pharmacist Clinical phone for 11/30/2017 until 3p is [phone] Please check AMION for all [place] numbers 11/30/2017 11:57 AM

## 2017-11-30 NOTE — Progress Notes (Signed)
[facility]                                                                              Patient Demographics  Joshua Massey, is a 42 y.o. male, DOB - Sep 09, 1975, BBC:[id number]  Admit date - 11/27/2017   Admitting Physician Janora Norlander, MD  Outpatient Primary MD for the patient is [name], [name], MD  Outpatient specialists:   LOS - 3  days   Medical records reviewed and are as summarized below:    No chief complaint on file.      Brief summary   Joshua Massey is a 42 y.o. male with h/o chronic systolic CHF, DM2, HTN, CKD stage 3, GERD who has not been seen by a physician in almost a year, who presented to the ED with c/o "extra fluid" and weight gain. Over the past 4-5 weeks pt has gained approx 60 lbs, up to 350 lbs today from his baseline of 290 lb. He has had progressive BLE edema R>L as well, especially over the last week. He denied dyspnea at rest but has had increased DOE for the last 2 weeks and due to the swelling in his legs, he has been unable to ambulate for the last 2 days. He reported no chest pain, orthopnea or palpitations. He reported that he "went off a low sodium diet" approx 2 months ago because he was "just tired" of it. He admitted to eating liberal amounts of salty foods since then. Patient was admitted for further work-up.  Assessment & Plan   AKI on CKD3/acute on chronic systolic CHF -EF is 50-38% down from 25% last year, nonischemic cardiomyopathy-has not followed at the heart failure clinic in over one year -Volume overloaded -Nephrology following, on high-dose IV Lasix 160 mg every 6 and metolazone  -continues to have minimal response to high-dose diuretics,  -creatinine in the 5-5.5 range, previous baseline was 2.6 in 12/2016 -advised by renal to have HD catheter placed to begin dialysis, unfortunately patient seems reluctant to do this at this time, I have advised him again regarding the need to have access for dialysis when he  is more stable rather than in a critical state  Acute on chronic systolic CHF -previously EF was 25%, now dropped to 15-20% -Per cardiology notes has NICM, had normal coronaries in 2015 -continue Coreg, hold Imdur with soft BPs  Diabetes mellitus with nephropathy, uncontrolled, insulin-dependent, type II -Hemoglobin A1c 11.5,  -CBGs lower, cutdown Lantus dose, but requires less dose now due to renal failure -now stable  Morbid obesity -Patient counseled on diet and weight control -BMI 50.94  Noncompliance -significant challenge in this complex patient with multisystem failure and uncontrolled diabetes  Hypertension -BP currently soft, -continue Coreg, I have discontinued Imdur and cutdown hydralazine  Right lower extremity cellulitis, right great toe diabetic foot ulcer -Continue clindamycin, wound care consulted - Dr Sharol Given consulted, local care and follow-up recommended -Change clindamycin to oral Keflex today  Code Status: Full CODE STATUS DVT Prophylaxis: Heparin SQ Family Communication: Discussed in detail with the patient, all imaging results, lab results explained to the patient  Disposition Plan:suspect he  will need to start dialysis this admission, expect at least another week long hospitalization  Time Spent in minutes   35 minutes  Procedures:  None Consultants:   Nephrology Orthopedics Dr. Sharol Given  Antimicrobials:   IV clindamycin 6/10   Medications  Scheduled Meds: . aspirin EC  81 mg Oral Daily  . carvedilol  12.5 mg Oral BID WC  . ferrous sulfate  325 mg Oral BID WC  . heparin  5,000 Units Subcutaneous Q8H  . hydrALAZINE  10 mg Oral TID  . insulin aspart  0-20 Units Subcutaneous TID WC  . insulin aspart  0-5 Units Subcutaneous QHS  . insulin glargine  5 Units Subcutaneous QHS  . metolazone  5 mg Oral Daily  . silver sulfADIAZINE   Topical Daily  . sodium chloride flush  3 mL Intravenous Q12H   Continuous Infusions: . sodium chloride    .  clindamycin (CLEOCIN) IV 600 mg (11/30/17 1319)  . furosemide 160 mg (11/30/17 0944)   PRN Meds:.sodium chloride, acetaminophen, ondansetron (ZOFRAN) IV, oxyCODONE-acetaminophen, sodium chloride flush   Antibiotics   Anti-infectives (From admission, onward)   Start     Dose/Rate Route Frequency Ordered Stop   11/27/17 2200  clindamycin (CLEOCIN) IVPB 600 mg     600 mg 100 mL/hr over 30 Minutes Intravenous Every 8 hours 11/27/17 2116     11/27/17 1645  clindamycin (CLEOCIN) IVPB 600 mg     600 mg 100 mL/hr over 30 Minutes Intravenous  Once 11/27/17 1637 11/27/17 1722        Subjective:   -denies any significant complaints, -Bothered by profound leg swelling  Objective:   Vitals:   11/30/17 0551 11/30/17 0600 11/30/17 0946 11/30/17 1136  BP: 118/85  94/62 126/87  Pulse: 83  81 85  Resp: 20     Temp: 98 F (36.7 C)  98.7 F (37.1 C) 98.1 F (36.7 C)  TempSrc: Oral  Oral Oral  SpO2: 97%  100% 92%  Weight:  (!) 162.8 kg (358 lb 12.8 oz)    Height:        Intake/Output Summary (Last 24 hours) at 11/30/2017 1346 Last data filed at 11/30/2017 1300 Gross per 24 hour  Intake 480 ml  Output 1200 ml  Net -720 ml     Wt Readings from Last 3 Encounters:  11/30/17 (!) 162.8 kg (358 lb 12.8 oz)  12/20/16 118.9 kg (262 lb 1 oz)  12/16/16 118.9 kg (262 lb 3.2 oz)     Exam Gen: Awake, Alert, Oriented X 3, morbidly obese, unkempt, chronically ill-appearing HEENT: PERRLA, Neck supple, + JVD Lungs:distant breath sounds, decreased at both bases  CVS: RRR,No Gallops,Rubs or new Murmurs Abd: soft, nontender, abdominal wall edema noted GU: scrotal edema noted Extremities: 3-4+ edema extending to upper thigh Psych: Normal affect and demeanor, alert and oriented x3    Data Reviewed:  I have personally reviewed following labs and imaging studies  Micro Results Recent Results (from the past 240 hour(s))  Culture, blood (routine x 2)     Status: None (Preliminary result)    Collection Time: 11/28/17 12:46 AM  Result Value Ref Range Status   Specimen Description BLOOD RIGHT ANTECUBITAL  Final   Special Requests   Final    BOTTLES DRAWN AEROBIC AND ANAEROBIC Blood Culture adequate volume   Culture   Final    NO GROWTH 1 DAY Performed at [facility], [address], [place], [place] [address]    Report  Status PENDING  Incomplete  Culture, blood (routine x 2)     Status: None (Preliminary result)   Collection Time: 11/28/17 12:46 AM  Result Value Ref Range Status   Specimen Description BLOOD RIGHT ANTECUBITAL  Final   Special Requests   Final    BOTTLES DRAWN AEROBIC AND ANAEROBIC Blood Culture adequate volume   Culture   Final    NO GROWTH 1 DAY Performed at [facility], [address], [place], [place] [address]    Report Status PENDING  Incomplete    Radiology Reports Dg Chest 2 View  Result Date: 11/27/2017 CLINICAL DATA:  Increased fluid buildup.  Weight gain. EXAM: CHEST - 2 VIEW COMPARISON:  08/16/2016 FINDINGS: There is marked enlargement of the cardiac silhouette. No pleural effusion or edema. No airspace opacities identified. The visualized osseous structures appear intact. IMPRESSION: 1. No acute cardiopulmonary abnormalities. 2. Cardiac enlargement as before. Electronically Signed   By: Kerby Moors M.D.   On: 11/27/2017 15:00   Dg Toe Great Right  Result Date: 11/27/2017 CLINICAL DATA:  Foot ulcer. EXAM: RIGHT GREAT TOE COMPARISON:  None. FINDINGS: Three-view exam of the right great toe shows a soft tissue defect compatible with ulcer. No underlying bony destruction or change to suggest osteomyelitis by plain film exam. Soft tissue swelling is evident. IMPRESSION: No radiographic evidence for osteomyelitis. Electronically Signed   By: Misty Stanley M.D.   On: 11/27/2017 14:57    Lab Data:  CBC: Recent Labs  Lab 11/27/17 1422  WBC 13.1*  NEUTROABS 11.8*  HGB 11.2*  HCT 35.5*  MCV 88.5  PLT 638   Basic Metabolic  Panel: Recent Labs  Lab 11/27/17 1422 11/28/17 0407 11/29/17 0802  NA 136 136  136 138  K 4.1 4.2  4.2 3.9  CL 101 102  105 103  CO2 23 22  23 23   GLUCOSE 167* 163*  162* 94  BUN 63* 72*  73* 79*  CREATININE 5.00* 5.51*  5.39* 5.32*  CALCIUM 7.5* 7.4*  7.3* 7.7*  PHOS  --  5.6*  --    GFR: Estimated Creatinine Clearance: 28.8 mL/min (A) (by C-G formula based on SCr of 5.32 mg/dL (H)). Liver Function Tests: Recent Labs  Lab 11/27/17 1422 11/28/17 0407  AST 69* 58*  ALT 38 37  ALKPHOS 79 72  BILITOT 2.3* 1.9*  PROT 6.2* 5.9*  ALBUMIN 2.0* 1.9*  1.8*   No results for input(s): LIPASE, AMYLASE in the last 168 hours. No results for input(s): AMMONIA in the last 168 hours. Coagulation Profile: No results for input(s): INR, PROTIME in the last 168 hours. Cardiac Enzymes: Recent Labs  Lab 11/27/17 2255 11/28/17 0407  TROPONINI 0.04* 0.10*   BNP (last 3 results) No results for input(s): PROBNP in the last 8760 hours. HbA1C: Recent Labs    11/28/17 0407  HGBA1C 11.5*   CBG: Recent Labs  Lab 11/29/17 1202 11/29/17 1646 11/29/17 2150 11/30/17 0740 11/30/17 1134  GLUCAP 99 108* 110* 109* 104*   Lipid Profile: No results for input(s): CHOL, HDL, LDLCALC, TRIG, CHOLHDL, LDLDIRECT in the last 72 hours. Thyroid Function Tests: No results for input(s): TSH, T4TOTAL, FREET4, T3FREE, THYROIDAB in the last 72 hours. Anemia Panel: No results for input(s): VITAMINB12, FOLATE, FERRITIN, TIBC, IRON, RETICCTPCT in the last 72 hours. Urine analysis:    Component Value Date/Time   COLORURINE AMBER (A) 08/17/2016 0129   APPEARANCEUR HAZY (A) 08/17/2016 0129   LABSPEC 1.016 08/17/2016 0129  PHURINE 5.0 08/17/2016 0129   GLUCOSEU 50 (A) 08/17/2016 0129   HGBUR SMALL (A) 08/17/2016 0129   BILIRUBINUR NEGATIVE 08/17/2016 0129   KETONESUR NEGATIVE 08/17/2016 0129   PROTEINUR >=300 (A) 08/17/2016 0129   UROBILINOGEN 0.2 09/02/2009 0935   NITRITE NEGATIVE  08/17/2016 0129   LEUKOCYTESUR NEGATIVE 08/17/2016 0129     Domenic Polite M.D. [facility] 11/30/2017, 1:46 PM  Page via [email or web address] - password [email or web address]  Call night coverage person covering after 7pm

## 2017-12-01 LAB — CBC
HEMATOCRIT: 35.6 % — AB (ref 39.0–52.0)
Hemoglobin: 11.4 g/dL — ABNORMAL LOW (ref 13.0–17.0)
MCH: 28.1 pg (ref 26.0–34.0)
MCHC: 32 g/dL (ref 30.0–36.0)
MCV: 87.7 fL (ref 78.0–100.0)
Platelets: 269 10*3/uL (ref 150–400)
RBC: 4.06 MIL/uL — ABNORMAL LOW (ref 4.22–5.81)
RDW: 17.2 % — AB (ref 11.5–15.5)
WBC: 8.2 10*3/uL (ref 4.0–10.5)

## 2017-12-01 LAB — BASIC METABOLIC PANEL
Anion gap: 11 (ref 5–15)
BUN: 82 mg/dL — AB (ref 6–20)
CALCIUM: 8 mg/dL — AB (ref 8.9–10.3)
CO2: 24 mmol/L (ref 22–32)
CREATININE: 4.78 mg/dL — AB (ref 0.61–1.24)
Chloride: 104 mmol/L (ref 101–111)
GFR calc Af Amer: 16 mL/min — ABNORMAL LOW (ref >60)
GFR calc non Af Amer: 14 mL/min — ABNORMAL LOW (ref >60)
GLUCOSE: 98 mg/dL (ref 65–99)
Potassium: 3.6 mmol/L (ref 3.5–5.1)
Sodium: 139 mmol/L (ref 135–145)

## 2017-12-01 LAB — GLUCOSE, CAPILLARY
Glucose-Capillary: 126 mg/dL — ABNORMAL HIGH (ref 65–99)
Glucose-Capillary: 86 mg/dL (ref 65–99)
Glucose-Capillary: 92 mg/dL (ref 65–99)
Glucose-Capillary: 111 mg/dL — ABNORMAL HIGH (ref 65–99)

## 2017-12-01 MED ORDER — LOPERAMIDE HCL 2 MG PO CAPS
2.0000 mg | ORAL_CAPSULE | ORAL | Status: DC | PRN
  Administered 2017-12-01 – 2017-12-03 (×3): 2 mg via ORAL
  Filled 2017-12-01 (×3): qty 1

## 2017-12-01 NOTE — Progress Notes (Addendum)
Inpatient Diabetes Program Recommendations  AACE/ADA: New Consensus Statement on Inpatient Glycemic Control (2015)  Target Ranges:  Prepandial:   less than 140 mg/dL      Peak postprandial:   less than 180 mg/dL (1-2 hours)      Critically ill patients:  140 - 180 mg/dL   Results for GORO, WENRICK (MRN [id number]) as of 12/01/2017 14:49  Ref. Range 12/01/2017 07:24 12/01/2017 12:31  Glucose-Capillary Latest Ref Range: 65 - 99 mg/dL 126 (H) 86   Results for SUYASH, AMORY (MRN [id number]) as of 12/01/2017 14:49  Ref. Range 11/28/2017 04:07  Hemoglobin A1C Latest Ref Range: 4.8 - 5.6 % 11.5 (H)    Home DM Meds: Lantus 20 units QAM, Novolog 0-15 units TID with meals (per home med list is NOT taking Lantus or Novolog)   Current Insulin Orders: Lantus 5 units QHS      Novolog Resistant Correction Scale/ SSI (0-20 units) TID AC + HS    MD- Please give pt the following Rxs at time of d/c:  1. Lantus insulin pen- Order # [id number]  2. Novolog insulin pen- Order # [id number]  4. Insulin pen needles- Order # [id number]    Spoke with patient about his current A1c of 11.5%.  Explained what an A1c is and what it measures.  Reminded patient that his goal A1c is 7% or less per ADA standards to prevent both acute and long-term complications.  Explained to patient the extreme importance of good glucose control at home.  Encouraged patient to check his CBGs at least TID AC at home and to record all CBGs in a logbook for his PCP or Endocrinologist to review.  Patient told me he stopped taking insulin at home about 3 months ago b/c his CBGs were well controlled and he didn't think he needed to take the insulin since his CBGs were good.  I discussed with pt that his CBGs were well controlled b/c he was taking the insulin as prescribed.  Encouraged pt to check CBGs at home (has meter) and to take the insulin as prescribed since his A1c reflects very poor glucose control at home.  Pt interested in getting the  Free[employer] Libre continuous glucose meter for home.  Strongly encouraged pt to follow up with his PCP: Dr. [name] after d/c.  Will need renewed Insulin Rxs at time of d/c as well.    --Will follow patient during hospitalization--  Wyn Quaker RN, MSN, CDE Diabetes Coordinator Inpatient Glycemic Control Team Team Pager: [phone] (8a-5p)

## 2017-12-01 NOTE — Progress Notes (Addendum)
Physical Therapy Treatment Patient Details Name: Joshua Massey MRN: [id number] DOB: Aug 22, 1975 Today's Date: 12/01/2017    History of Present Illness 42 y.o. male with h/o chronic systolic CHF, DM2, HTN, CKD stage 3, GERD who has not been seen by a physician in almost a year, who presented to the ED with c/o "extra fluid" and weight gain    PT Comments    Pt showing improved mobility and tolerance to increased distance using RW. Updated equipment recs to include Bariatric RW. He required min A for bed mobilities and transfers. He ambulated with min guard for 100 ft. Will continue to follow acutely to maximize pt's functional independence and activity tolerance.    Follow Up Recommendations  Home health PT;Supervision for mobility/OOB     Equipment Recommendations  Other (comment)(Bariatric RW)    Recommendations for Other Services       Precautions / Restrictions Precautions Precautions: Fall Restrictions Weight Bearing Restrictions: No    Mobility  Bed Mobility Overal bed mobility: Needs Assistance Bed Mobility: Supine to Sit;Sit to Supine     Supine to sit: Modified independent (Device/Increase time) Sit to supine: Min assist   General bed mobility comments: increased time and effort to perform with use of bed rails to come to EOB. Min A for LE management to return to supine.  Transfers Overall transfer level: Needs assistance Equipment used: Rolling walker (2 wheeled) Transfers: Sit to/from Stand Sit to Stand: Min assist;From elevated surface         General transfer comment: min A with bed significantly elevated to come to standing  Ambulation/Gait Ambulation/Gait assistance: Min guard Gait Distance (Feet): 100 Feet Assistive device: Rolling walker (2 wheeled) Gait Pattern/deviations: Wide base of support;Antalgic;Step-through pattern;Decreased stride length;Decreased step length - right;Decreased step length - left Gait velocity: decreasd   General  Gait Details: Pt with wide BOS secondary to scrotal swelling and LE edema. Cues for technqiue with RW.    Stairs             Wheelchair Mobility    Modified Rankin (Stroke Patients Only)       Balance Overall balance assessment: Needs assistance   Sitting balance-Leahy Scale: Fair Sitting balance - Comments: able to sit EOb without support   Standing balance support: During functional activity Standing balance-Leahy Scale: Poor Standing balance comment: reliance on UE support                            Cognition Arousal/Alertness: Awake/alert Behavior During Therapy: WFL for tasks assessed/performed Overall Cognitive Status: Within Functional Limits for tasks assessed                                        Exercises      General Comments General comments (skin integrity, edema, etc.): assist required to don socks      Pertinent Vitals/Pain Pain Assessment: Faces Faces Pain Scale: Hurts little more Pain Location: groin and back Pain Descriptors / Indicators: Discomfort Pain Intervention(s): Monitored during session;Limited activity within patient's tolerance    Home Living                      Prior Function            PT Goals (current goals can now be found in the care plan section) Acute Rehab PT Goals  Patient Stated Goal: to go home PT Goal Formulation: With patient Time For Goal Achievement: 12/13/17 Potential to Achieve Goals: Good Progress towards PT goals: Progressing toward goals    Frequency    Min 3X/week      PT Plan Current plan remains appropriate;Equipment recommendations need to be updated    Co-evaluation              AM-PAC PT "6 Clicks" Daily Activity  Outcome Measure  Difficulty turning over in bed (including adjusting bedclothes, sheets and blankets)?: None Difficulty moving from lying on back to sitting on the side of the bed? : None Difficulty sitting down on and standing  up from a chair with arms (e.g., wheelchair, bedside commode, etc,.)?: Unable Help needed moving to and from a bed to chair (including a wheelchair)?: A Little Help needed walking in hospital room?: A Little Help needed climbing 3-5 steps with a railing? : Total 6 Click Score: 16    End of Session Equipment Utilized During Treatment: Gait belt Activity Tolerance: Patient limited by fatigue Patient left: in bed;with call bell/phone within reach Nurse Communication: Mobility status PT Visit Diagnosis: Muscle weakness (generalized) (M62.81);Unsteadiness on feet (R26.81);Difficulty in walking, not elsewhere classified (R26.2)     Time: 7014-1030 PT Time Calculation (min) (ACUTE ONLY): 27 min  Charges:  $Gait Training: 23-37 mins                    G Codes:       [name], [place] Pager [phone] Acute Rehab   [name] 12/01/2017, 1:23 PM

## 2017-12-01 NOTE — Progress Notes (Signed)
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 aspirin EC  81 mg Oral Daily  . carvedilol  12.5 mg Oral BID WC  . cephALEXin  250 mg Oral Q12H  . ferrous sulfate  325 mg Oral BID WC  . heparin  5,000 Units Subcutaneous Q8H  . hydrALAZINE  10 mg Oral TID  . insulin aspart  0-20 Units Subcutaneous TID WC  . insulin aspart  0-5 Units Subcutaneous QHS  . insulin glargine  5 Units Subcutaneous QHS  . metolazone  5 mg Oral Daily  . silver sulfADIAZINE   Topical Daily   Continuous Infusions: . furosemide Stopped (12/01/17 3500)   PRN Meds:.acetaminophen, ondansetron (ZOFRAN) IV, oxyCODONE-acetaminophen  Current Labs: reviewed  6/11 TTE LVEF 15 to 20% with biventricular failure   Physical Exam:  Blood pressure 119/86, pulse 80, temperature 98 F (36.7 C), temperature source Oral, resp. rate 20, height 5\' 11"  (1.803 m), weight (!) 158.6 kg (349 lb 9.6 oz), SpO2 99 %. NAD, MOrbidly obese RRR w/o mgr nl s1s2 CTAB Massive LEE, tight, into proximal legs, abd wall, scrotum Distal legs wrapped Wound on r 1st toe No asterixus  A 1. CKD5, Massive Hypervolemia and sub-responsive to high dose diuretics 2. +Fhx of ESRD and Dialysis (father, brother) 3. Chronic sCHF, binventricular; decompensated 4. Morbid Obesity 5. Venous stasis ulcer on R toe 6. DM2 uncontrolled A1c 11.5%  P 1. Slight improvement in volume status today, cont diuretics 2. Have made strong  recommendation for immediate initiation of hemodialysis given hypervolemia and being diuretic refractory; he is not willing to do so.   3. Cont to discuss #2 with him 4. At least he needs AVF now 5. Daily weights, Daily Renal Panel, Strict I/Os, Avoid nephrotoxins (NSAIDs, judicious IV Contrast) 6. Medication Issues; Preferred narcotic agents for pain control are hydromorphone, fentanyl, and methadone. Morphine should not be used. Oxydone is not preferred.  Baclofen should not be used. Avoid Fleets and Magnesium Citrate Bowel Preps    Pearson Grippe MD 12/01/2017, 8:44 AM  Recent Labs  Lab 11/28/17 0407 11/29/17 0802 12/01/17 0522  NA 136  136 138 139  K 4.2  4.2 3.9 3.6  CL 102  105 103 104  CO2 22  23 23 24   GLUCOSE 163*  162* 94 98  BUN 72*  73* 79* 82*  CREATININE 5.51*  5.39* 5.32* 4.78*  CALCIUM 7.4*  7.3* 7.7* 8.0*  PHOS 5.6*  --   --    Recent Labs  Lab 11/27/17 1422 12/01/17 0522  WBC 13.1* 8.2  NEUTROABS 11.8*  --   HGB 11.2* 11.4*  HCT 35.5* 35.6*  MCV 88.5 87.7  PLT 205 269

## 2017-12-01 NOTE — Progress Notes (Signed)
MD could u continue the order for tele monitoring, thanks, Arvella Nigh RN.

## 2017-12-01 NOTE — Progress Notes (Signed)
[facility]                                                                              Patient Demographics  Joshua Massey, is a 42 y.o. male, DOB - 1975/12/24, CHY:[id number]  Admit date - 11/27/2017   Admitting Physician Janora Norlander, MD  Outpatient Primary MD for the patient is [name], [name], MD  Outpatient specialists:   LOS - 4  days   Medical records reviewed and are as summarized below:    No chief complaint on file.      Brief summary   Joshua Massey is a 42 y.o. male with h/o chronic systolic CHF, DM2, HTN, CKD stage 3, GERD who has not been seen by a physician in almost a year, who presented to the ED with c/o "extra fluid" and weight gain. Over the past 4-5 weeks pt has gained approx 60 lbs, up to 350 lbs today from his baseline of 290 lb. He has had progressive BLE edema R>L as well, especially over the last week. He denied dyspnea at rest but has had increased DOE for the last 2 weeks and due to the swelling in his legs, he has been unable to ambulate for the last 2 days. He reported no chest pain, orthopnea or palpitations. He reported that he "went off a low sodium diet" approx 2 months ago because he was "just tired" of it. He admitted to eating liberal amounts of salty foods since then. Patient was admitted for further work-up.  Assessment & Plan   AKI on CKD3/acute on chronic systolic CHF -EF is 87-86% down from 25% last year, nonischemic cardiomyopathy-has not followed at the heart failure clinic in over one year -Volume overloaded -Nephrology following, on high-dose IV Lasix 160 mg every 6 and metolazone  -had fairly poor response to diuretics initially however put out 2.8 L yesterday -creatinine down from 5.5 to 4.8 now, previous baseline was 2.6 in 12/2016 -continue to advise him regarding access for hemodialysis at least for the near future even if he doesn't need it immediately, remains reluctant about this  Acute on chronic  systolic CHF -previously EF was 25%, now dropped to 15-20% -Per cardiology notes has NICM, had normal coronaries in 2015 -continue Coreg, hold Imdur with soft BPs -stop hydralazine  Diabetes mellitus with nephropathy, uncontrolled, insulin-dependent, type II -Hemoglobin A1c 11.5,  -CBGs lower, cutdown Lantus dose, but requires less dose now due to renal failure -now stable  Morbid obesity -Patient counseled on diet and weight control -BMI 50.94  Noncompliance -significant challenge in this complex patient with multisystem failure and uncontrolled diabetes  Hypertension -BP currently soft, -continue Coreg, I have discontinued Imdur and stopped hydralazine  Right lower extremity cellulitis, right great toe diabetic foot ulcer -Continue clindamycin, wound care consulted - Dr Sharol Given consulted, local care and follow-up recommended -Changed clindamycin to oral Keflex -continue for 3 more days  Code Status: Full CODE STATUS DVT Prophylaxis: Heparin SQ Family Communication: Discussed in detail with the patient, all imaging results, lab results explained to the patient  Disposition Plan:expect at least another week long hospitalization  Time Spent  in minutes   35 minutes  Procedures:  None Consultants:   Nephrology Orthopedics Dr. Sharol Given  Antimicrobials:   IV clindamycin 6/10   Medications  Scheduled Meds: . aspirin EC  81 mg Oral Daily  . carvedilol  12.5 mg Oral BID WC  . cephALEXin  250 mg Oral Q12H  . ferrous sulfate  325 mg Oral BID WC  . heparin  5,000 Units Subcutaneous Q8H  . hydrALAZINE  10 mg Oral TID  . insulin aspart  0-20 Units Subcutaneous TID WC  . insulin aspart  0-5 Units Subcutaneous QHS  . insulin glargine  5 Units Subcutaneous QHS  . metolazone  5 mg Oral Daily  . silver sulfADIAZINE   Topical Daily   Continuous Infusions: . furosemide Stopped (12/01/17 1039)   PRN Meds:.acetaminophen, ondansetron (ZOFRAN) IV,  oxyCODONE-acetaminophen   Antibiotics   Anti-infectives (From admission, onward)   Start     Dose/Rate Route Frequency Ordered Stop   11/30/17 2000  cephALEXin (KEFLEX) capsule 250 mg    Note to Pharmacy:  Please adjust dose for GFR as needed   250 mg Oral Every 12 hours 11/30/17 1350     11/27/17 2200  clindamycin (CLEOCIN) IVPB 600 mg  Status:  Discontinued     600 mg 100 mL/hr over 30 Minutes Intravenous Every 8 hours 11/27/17 2116 11/30/17 1350   11/27/17 1645  clindamycin (CLEOCIN) IVPB 600 mg     600 mg 100 mL/hr over 30 Minutes Intravenous  Once 11/27/17 1637 11/27/17 1722        Subjective:   -increase urine output yesterday  Objective:   Vitals:   11/30/17 2238 11/30/17 2242 12/01/17 0501 12/01/17 0922  BP: (!) 97/57 123/88 119/86 115/84  Pulse: 83 84 80   Resp:   20   Temp:   98 F (36.7 C)   TempSrc:   Oral   SpO2:   99%   Weight:   (!) 158.6 kg (349 lb 9.6 oz)   Height:        Intake/Output Summary (Last 24 hours) at 12/01/2017 1333 Last data filed at 12/01/2017 0930 Gross per 24 hour  Intake 931.6 ml  Output 2321 ml  Net -1389.4 ml     Wt Readings from Last 3 Encounters:  12/01/17 (!) 158.6 kg (349 lb 9.6 oz)  12/20/16 118.9 kg (262 lb 1 oz)  12/16/16 118.9 kg (262 lb 3.2 oz)     Exam Gen: Awake, Alert, Oriented X 3, chronically ill-appearing HEENT: PERRLA, Neck supple, no JVD Lungs: distant breath sounds, decreased at both bases CVS: RRR,No Gallops,Rubs or new Murmurs Abd: soft, Non tender, non distended, BS present GU: large scrotal swelling Extremities:both lower legs and Ace wraps, edema up to upper thigh Skin: no new rashes Psych: Normal affect and demeanor, alert and oriented x3    Data Reviewed:  I have personally reviewed following labs and imaging studies  Micro Results Recent Results (from the past 240 hour(s))  Culture, blood (routine x 2)     Status: None (Preliminary result)   Collection Time: 11/28/17 12:46 AM  Result  Value Ref Range Status   Specimen Description BLOOD RIGHT ANTECUBITAL  Final   Special Requests   Final    BOTTLES DRAWN AEROBIC AND ANAEROBIC Blood Culture adequate volume   Culture   Final    NO GROWTH 2 DAYS Performed at [facility], [address], [place], [place] [address]    Report Status PENDING  Incomplete  Culture, blood (routine x 2)     Status: None (Preliminary result)   Collection Time: 11/28/17 12:46 AM  Result Value Ref Range Status   Specimen Description BLOOD RIGHT ANTECUBITAL  Final   Special Requests   Final    BOTTLES DRAWN AEROBIC AND ANAEROBIC Blood Culture adequate volume   Culture   Final    NO GROWTH 2 DAYS Performed at [facility], [address], [place], [place] [address]    Report Status PENDING  Incomplete    Radiology Reports Dg Chest 2 View  Result Date: 11/27/2017 CLINICAL DATA:  Increased fluid buildup.  Weight gain. EXAM: CHEST - 2 VIEW COMPARISON:  08/16/2016 FINDINGS: There is marked enlargement of the cardiac silhouette. No pleural effusion or edema. No airspace opacities identified. The visualized osseous structures appear intact. IMPRESSION: 1. No acute cardiopulmonary abnormalities. 2. Cardiac enlargement as before. Electronically Signed   By: Kerby Moors M.D.   On: 11/27/2017 15:00   Dg Toe Great Right  Result Date: 11/27/2017 CLINICAL DATA:  Foot ulcer. EXAM: RIGHT GREAT TOE COMPARISON:  None. FINDINGS: Three-view exam of the right great toe shows a soft tissue defect compatible with ulcer. No underlying bony destruction or change to suggest osteomyelitis by plain film exam. Soft tissue swelling is evident. IMPRESSION: No radiographic evidence for osteomyelitis. Electronically Signed   By: Misty Stanley M.D.   On: 11/27/2017 14:57    Lab Data:  CBC: Recent Labs  Lab 11/27/17 1422 12/01/17 0522  WBC 13.1* 8.2  NEUTROABS 11.8*  --   HGB 11.2* 11.4*  HCT 35.5* 35.6*  MCV 88.5 87.7  PLT 205 485   Basic Metabolic  Panel: Recent Labs  Lab 11/27/17 1422 11/28/17 0407 11/29/17 0802 12/01/17 0522  NA 136 136  136 138 139  K 4.1 4.2  4.2 3.9 3.6  CL 101 102  105 103 104  CO2 23 22  23 23 24   GLUCOSE 167* 163*  162* 94 98  BUN 63* 72*  73* 79* 82*  CREATININE 5.00* 5.51*  5.39* 5.32* 4.78*  CALCIUM 7.5* 7.4*  7.3* 7.7* 8.0*  PHOS  --  5.6*  --   --    GFR: Estimated Creatinine Clearance: 31.6 mL/min (A) (by C-G formula based on SCr of 4.78 mg/dL (H)). Liver Function Tests: Recent Labs  Lab 11/27/17 1422 11/28/17 0407  AST 69* 58*  ALT 38 37  ALKPHOS 79 72  BILITOT 2.3* 1.9*  PROT 6.2* 5.9*  ALBUMIN 2.0* 1.9*  1.8*   No results for input(s): LIPASE, AMYLASE in the last 168 hours. No results for input(s): AMMONIA in the last 168 hours. Coagulation Profile: No results for input(s): INR, PROTIME in the last 168 hours. Cardiac Enzymes: Recent Labs  Lab 11/27/17 2255 11/28/17 0407  TROPONINI 0.04* 0.10*   BNP (last 3 results) No results for input(s): PROBNP in the last 8760 hours. HbA1C: No results for input(s): HGBA1C in the last 72 hours. CBG: Recent Labs  Lab 11/30/17 1134 11/30/17 1643 11/30/17 2128 12/01/17 0724 12/01/17 1231  GLUCAP 104* 99 109* 126* 86   Lipid Profile: No results for input(s): CHOL, HDL, LDLCALC, TRIG, CHOLHDL, LDLDIRECT in the last 72 hours. Thyroid Function Tests: No results for input(s): TSH, T4TOTAL, FREET4, T3FREE, THYROIDAB in the last 72 hours. Anemia Panel: No results for input(s): VITAMINB12, FOLATE, FERRITIN, TIBC, IRON, RETICCTPCT in the last 72 hours. Urine analysis:    Component Value Date/Time   COLORURINE AMBER (A) 08/17/2016  0129   APPEARANCEUR HAZY (A) 08/17/2016 0129   LABSPEC 1.016 08/17/2016 0129   PHURINE 5.0 08/17/2016 0129   GLUCOSEU 50 (A) 08/17/2016 0129   HGBUR SMALL (A) 08/17/2016 0129   BILIRUBINUR NEGATIVE 08/17/2016 0129   KETONESUR NEGATIVE 08/17/2016 0129   PROTEINUR >=300 (A) 08/17/2016 0129    UROBILINOGEN 0.2 09/02/2009 0935   NITRITE NEGATIVE 08/17/2016 0129   LEUKOCYTESUR NEGATIVE 08/17/2016 0129     Domenic Polite M.D. [facility] 12/01/2017, 1:33 PM  Page via [email or web address] - password [email or web address]  Call night coverage person covering after 7pm

## 2017-12-02 LAB — GLUCOSE, CAPILLARY
Glucose-Capillary: 131 mg/dL — ABNORMAL HIGH (ref 65–99)
Glucose-Capillary: 128 mg/dL — ABNORMAL HIGH (ref 65–99)
Glucose-Capillary: 117 mg/dL — ABNORMAL HIGH (ref 65–99)
Glucose-Capillary: 105 mg/dL — ABNORMAL HIGH (ref 65–99)

## 2017-12-02 LAB — BASIC METABOLIC PANEL
ANION GAP: 10 (ref 5–15)
Anion gap: 9 (ref 5–15)
BUN: 79 mg/dL — ABNORMAL HIGH (ref 6–20)
BUN: 75 mg/dL — AB (ref 6–20)
CALCIUM: 8.2 mg/dL — AB (ref 8.9–10.3)
CHLORIDE: 102 mmol/L (ref 101–111)
CO2: 27 mmol/L (ref 22–32)
CO2: 26 mmol/L (ref 22–32)
Calcium: 8.2 mg/dL — ABNORMAL LOW (ref 8.9–10.3)
Chloride: 102 mmol/L (ref 101–111)
Creatinine, Ser: 4.25 mg/dL — ABNORMAL HIGH (ref 0.61–1.24)
Creatinine, Ser: 3.92 mg/dL — ABNORMAL HIGH (ref 0.61–1.24)
GFR calc Af Amer: 19 mL/min — ABNORMAL LOW (ref >60)
GFR calc Af Amer: 21 mL/min — ABNORMAL LOW (ref >60)
GFR calc non Af Amer: 18 mL/min — ABNORMAL LOW (ref >60)
GFR, EST NON AFRICAN AMERICAN: 16 mL/min — AB (ref >60)
Glucose, Bld: 105 mg/dL — ABNORMAL HIGH (ref 65–99)
Glucose, Bld: 105 mg/dL — ABNORMAL HIGH (ref 65–99)
POTASSIUM: 3.6 mmol/L (ref 3.5–5.1)
Potassium: 3.4 mmol/L — ABNORMAL LOW (ref 3.5–5.1)
Sodium: 139 mmol/L (ref 135–145)
Sodium: 137 mmol/L (ref 135–145)

## 2017-12-02 LAB — CBC
HEMATOCRIT: 35.3 % — AB (ref 39.0–52.0)
Hemoglobin: 11.4 g/dL — ABNORMAL LOW (ref 13.0–17.0)
MCH: 28.1 pg (ref 26.0–34.0)
MCHC: 32.3 g/dL (ref 30.0–36.0)
MCV: 87.2 fL (ref 78.0–100.0)
PLATELETS: 330 10*3/uL (ref 150–400)
RBC: 4.05 MIL/uL — AB (ref 4.22–5.81)
RDW: 17.3 % — AB (ref 11.5–15.5)
WBC: 7.7 10*3/uL (ref 4.0–10.5)

## 2017-12-02 LAB — MAGNESIUM: Magnesium: 2 mg/dL (ref 1.7–2.4)

## 2017-12-02 MED ORDER — POTASSIUM CHLORIDE CRYS ER 20 MEQ PO TBCR
40.0000 meq | EXTENDED_RELEASE_TABLET | Freq: Once | ORAL | Status: AC
  Administered 2017-12-02: 40 meq via ORAL
  Filled 2017-12-02: qty 2

## 2017-12-02 NOTE — Progress Notes (Signed)
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 aspirin EC  81 mg Oral Daily  . carvedilol  12.5 mg Oral BID WC  . cephALEXin  250 mg Oral Q12H  . ferrous sulfate  325 mg Oral BID WC  . heparin  5,000 Units Subcutaneous Q8H  . insulin aspart  0-20 Units Subcutaneous TID WC  . insulin aspart  0-5 Units Subcutaneous QHS  . insulin glargine  5 Units Subcutaneous QHS  . metolazone  5 mg Oral Daily  . silver sulfADIAZINE   Topical Daily   Continuous Infusions: . furosemide Stopped (12/02/17 0603)   PRN Meds:.acetaminophen, loperamide, ondansetron (ZOFRAN) IV, oxyCODONE-acetaminophen  Current Labs: reviewed  6/11 TTE LVEF 15 to 20% with biventricular failure   Physical Exam:  Blood pressure 108/80, pulse (!) 107, temperature 98 F (36.7 C), temperature source Oral, resp. rate 18, height 5\' 11"  (1.803 m), weight (!) 151.9 kg (334 lb 14.4 oz), SpO2 97 %. NAD, MOrbidly obese RRR w/o mgr nl s1s2 CTAB Massive LEE, tight, into proximal legs, abd wall, scrotum Distal legs wrapped Wound on r 1st toe No asterixus  A 1. CKD5, Massive Hypervolemia now responding to high dose diuretics 2. +Fhx of ESRD and Dialysis (father, brother) 3. Chronic sCHF, binventricular; decompensated 4. Morbid Obesity 5. Venous stasis ulcer on R toe 6. DM2 uncontrolled A1c 11.5%  P 1. Thankfully, he is now responding to diuretics and question of immediate need for dialysis is less important  2. He still  really needs to plan for dialysis nevertheless, I think would be great if he could get an access before discharge; he remains unwilling to do so  3. Continue diuretics without changes 4. Follow twice daily labs given his significant diuresis 5. Daily weights, Daily Renal Panel, Strict I/Os, Avoid nephrotoxins (NSAIDs, judicious IV Contrast) 6. Medication Issues; Preferred narcotic agents for pain control are hydromorphone, fentanyl, and methadone. Morphine should not be used. Oxydone is not preferred.  Baclofen should not be used. Avoid Fleets and Magnesium Citrate Bowel Preps    Pearson Grippe MD 12/02/2017, 7:10 AM  Recent Labs  Lab 11/28/17 0407 11/29/17 0802 12/01/17 0522 12/02/17 0405  NA 136  136 138 139 139  K 4.2  4.2 3.9 3.6 3.4*  CL 102  105 103 104 102  CO2 22  23 23 24 27   GLUCOSE 163*  162* 94 98 105*  BUN 72*  73* 79* 82* 79*  CREATININE 5.51*  5.39* 5.32* 4.78* 4.25*  CALCIUM 7.4*  7.3* 7.7* 8.0* 8.2*  PHOS 5.6*  --   --   --    Recent Labs  Lab 11/27/17 1422 12/01/17 0522 12/02/17 0405  WBC 13.1* 8.2 7.7  NEUTROABS 11.8*  --   --   HGB 11.2* 11.4* 11.4*  HCT 35.5* 35.6* 35.3*  MCV 88.5 87.7 87.2  PLT 205 269 330

## 2017-12-02 NOTE — Progress Notes (Signed)
[facility]                                                                              Patient Demographics  Joshua Massey, is a 42 y.o. male, DOB - 04-18-1976, RKY:[id number]  Admit date - 11/27/2017   Admitting Physician Janora Norlander, MD  Outpatient Primary MD for the patient is [name], [name], MD  Outpatient specialists:   LOS - 5  days   Medical records reviewed and are as summarized below:    No chief complaint on file.      Brief summary   RAHM Massey is a 42 y.o. male with h/o chronic systolic CHF, DM2, HTN, CKD stage 3, GERD who has not been seen by a physician in almost a year, who presented to the ED with c/o "extra fluid" and weight gain. Over the past 4-5 weeks pt has gained approx 60 lbs, up to 350 lbs today from his baseline of 290 lb. He has had progressive BLE edema R>L as well, especially over the last week. He denied dyspnea at rest but has had increased DOE for the last 2 weeks and due to the swelling in his legs, he has been unable to ambulate for the last 2 days. He reported no chest pain, orthopnea or palpitations. He reported that he "went off a low sodium diet" approx 2 months ago because he was "just tired" of it. He admitted to eating liberal amounts of salty foods since then. Patient was admitted for further work-up.  Assessment & Plan   AKI on CKD3/acute on chronic systolic CHF -EF is 31-51% down from 25% last year, nonischemic cardiomyopathy-has not followed at the heart failure clinic in over one year -Nephrology following, on high-dose IV Lasix 160 mg every 6 and metolazone  -had fairly poor response to diuretics initially, significant increase in urine output in the last 48 hours, diuresed 7.3 L in past 24h, and is net -5 L now  -creatinine down from 5.5 -> 4.8-> 4.2 now,  previous baseline was 2.6 in 12/2016 -we have continued to recommend AV fistula for future dialysis needs, patient continues to refuse,  oblivious  Acute on chronic systolic CHF -previously EF was 25%, now dropped to 15-20% -Per cardiology notes has NICM, had normal coronaries in 2015 -continue Coreg, stopped hydralazine and Imdur due to soft BPs, to allow more room for aggressive diuresis  Diabetes mellitus with nephropathy, uncontrolled, insulin-dependent, type II -Hemoglobin A1c 11.5,  -cutdown Lantus dose, but requires less dose now due to renal failure -CBG is stable now  Morbid obesity -Patient counseled on diet and weight control -BMI 50.94  Noncompliance -significant challenge in this complex patient with multisystem failure and uncontrolled diabetes  Hypertension -BP currently soft, -continue Coreg, I have discontinued Imdur and stopped hydralazine  Right lower extremity cellulitis, right great toe diabetic foot ulcer -Continue clindamycin, wound care consulted - Dr Sharol Given consulted, local care and follow-up recommended -Changed clindamycin to oral Keflex -continue for 2 more days  Code Status: Full CODE STATUS DVT Prophylaxis: Heparin SQ Family Communication: Discussed in detail with the patient, all imaging results, lab results  explained to the patient  Disposition Plan:expect at least another week long hospitalization  Time Spent in minutes   35 minutes  Procedures:  None Consultants:   Nephrology Orthopedics Dr. Sharol Given  Antimicrobials:   IV clindamycin 6/10   Medications  Scheduled Meds: . aspirin EC  81 mg Oral Daily  . carvedilol  12.5 mg Oral BID WC  . cephALEXin  250 mg Oral Q12H  . ferrous sulfate  325 mg Oral BID WC  . heparin  5,000 Units Subcutaneous Q8H  . insulin aspart  0-20 Units Subcutaneous TID WC  . insulin aspart  0-5 Units Subcutaneous QHS  . insulin glargine  5 Units Subcutaneous QHS  . metolazone  5 mg Oral Daily  . silver sulfADIAZINE   Topical Daily   Continuous Infusions: . furosemide Stopped (12/02/17 1109)   PRN Meds:.acetaminophen, loperamide,  ondansetron (ZOFRAN) IV, oxyCODONE-acetaminophen   Antibiotics   Anti-infectives (From admission, onward)   Start     Dose/Rate Route Frequency Ordered Stop   11/30/17 2000  cephALEXin (KEFLEX) capsule 250 mg    Note to Pharmacy:  Please adjust dose for GFR as needed   250 mg Oral Every 12 hours 11/30/17 1350     11/27/17 2200  clindamycin (CLEOCIN) IVPB 600 mg  Status:  Discontinued     600 mg 100 mL/hr over 30 Minutes Intravenous Every 8 hours 11/27/17 2116 11/30/17 1350   11/27/17 1645  clindamycin (CLEOCIN) IVPB 600 mg     600 mg 100 mL/hr over 30 Minutes Intravenous  Once 11/27/17 1637 11/27/17 1722        Subjective:  -feels better, and swelling starting to improve, urine output picked up a lot  Objective:   Vitals:   12/01/17 1924 12/02/17 0318 12/02/17 0500 12/02/17 1155  BP: 99/67  108/80 125/80  Pulse: 78  (!) 107 84  Resp: 18  18 18   Temp: 98.1 F (36.7 C)  98 F (36.7 C) 97.7 F (36.5 C)  TempSrc: Oral  Oral Oral  SpO2: 98%  97% 97%  Weight:  (!) 151.9 kg (334 lb 14.4 oz)    Height:        Intake/Output Summary (Last 24 hours) at 12/02/2017 1231 Last data filed at 12/02/2017 0900 Gross per 24 hour  Intake 1178.8 ml  Output 8100 ml  Net -6921.2 ml     Wt Readings from Last 3 Encounters:  12/02/17 (!) 151.9 kg (334 lb 14.4 oz)  12/20/16 118.9 kg (262 lb 1 oz)  12/16/16 118.9 kg (262 lb 3.2 oz)     Exam Gen: Awake, Alert, Oriented X 3, chronically ill-appearing HEENT: PERRLA, Neck supple, no JVD Lungs: distant breath sounds, decreased at both bases CVS: RRR,No Gallops,Rubs or new Murmurs Abd: soft, Non tender, non distended, BS present GU: large scrotal swelling Extremities:both lower legs and Ace wraps, edema up to upper thigh Skin: no new rashes Psych: Normal affect and demeanor, alert and oriented x3    Data Reviewed:  I have personally reviewed following labs and imaging studies  Micro Results Recent Results (from the past 240  hour(s))  Culture, blood (routine x 2)     Status: None (Preliminary result)   Collection Time: 11/28/17 12:46 AM  Result Value Ref Range Status   Specimen Description BLOOD RIGHT ANTECUBITAL  Final   Special Requests   Final    BOTTLES DRAWN AEROBIC AND ANAEROBIC Blood Culture adequate volume   Culture   Final    NO GROWTH  4 DAYS Performed at [facility], [place] [address], [place], [place] [address]    Report Status PENDING  Incomplete  Culture, blood (routine x 2)     Status: None (Preliminary result)   Collection Time: 11/28/17 12:46 AM  Result Value Ref Range Status   Specimen Description BLOOD RIGHT ANTECUBITAL  Final   Special Requests   Final    BOTTLES DRAWN AEROBIC AND ANAEROBIC Blood Culture adequate volume   Culture   Final    NO GROWTH 4 DAYS Performed at [facility], [place] [address], [place], [place] [address]    Report Status PENDING  Incomplete    Radiology Reports Dg Chest 2 View  Result Date: 11/27/2017 CLINICAL DATA:  Increased fluid buildup.  Weight gain. EXAM: CHEST - 2 VIEW COMPARISON:  08/16/2016 FINDINGS: There is marked enlargement of the cardiac silhouette. No pleural effusion or edema. No airspace opacities identified. The visualized osseous structures appear intact. IMPRESSION: 1. No acute cardiopulmonary abnormalities. 2. Cardiac enlargement as before. Electronically Signed   By: Kerby Moors M.D.   On: 11/27/2017 15:00   Dg Toe Great Right  Result Date: 11/27/2017 CLINICAL DATA:  Foot ulcer. EXAM: RIGHT GREAT TOE COMPARISON:  None. FINDINGS: Three-view exam of the right great toe shows a soft tissue defect compatible with ulcer. No underlying bony destruction or change to suggest osteomyelitis by plain film exam. Soft tissue swelling is evident. IMPRESSION: No radiographic evidence for osteomyelitis. Electronically Signed   By: Misty Stanley M.D.   On: 11/27/2017 14:57    Lab Data:  CBC: Recent Labs  Lab 11/27/17 1422 12/01/17 0522  12/02/17 0405  WBC 13.1* 8.2 7.7  NEUTROABS 11.8*  --   --   HGB 11.2* 11.4* 11.4*  HCT 35.5* 35.6* 35.3*  MCV 88.5 87.7 87.2  PLT 205 269 601   Basic Metabolic Panel: Recent Labs  Lab 11/27/17 1422 11/28/17 0407 11/29/17 0802 12/01/17 0522 12/02/17 0405  NA 136 136  136 138 139 139  K 4.1 4.2  4.2 3.9 3.6 3.4*  CL 101 102  105 103 104 102  CO2 23 22  23 23 24 27   GLUCOSE 167* 163*  162* 94 98 105*  BUN 63* 72*  73* 79* 82* 79*  CREATININE 5.00* 5.51*  5.39* 5.32* 4.78* 4.25*  CALCIUM 7.5* 7.4*  7.3* 7.7* 8.0* 8.2*  PHOS  --  5.6*  --   --   --    GFR: Estimated Creatinine Clearance: 34.6 mL/min (A) (by C-G formula based on SCr of 4.25 mg/dL (H)). Liver Function Tests: Recent Labs  Lab 11/27/17 1422 11/28/17 0407  AST 69* 58*  ALT 38 37  ALKPHOS 79 72  BILITOT 2.3* 1.9*  PROT 6.2* 5.9*  ALBUMIN 2.0* 1.9*  1.8*   No results for input(s): LIPASE, AMYLASE in the last 168 hours. No results for input(s): AMMONIA in the last 168 hours. Coagulation Profile: No results for input(s): INR, PROTIME in the last 168 hours. Cardiac Enzymes: Recent Labs  Lab 11/27/17 2255 11/28/17 0407  TROPONINI 0.04* 0.10*   BNP (last 3 results) No results for input(s): PROBNP in the last 8760 hours. HbA1C: No results for input(s): HGBA1C in the last 72 hours. CBG: Recent Labs  Lab 12/01/17 1231 12/01/17 1642 12/01/17 2110 12/02/17 0723 12/02/17 1151  GLUCAP 86 92 111* 131* 128*   Lipid Profile: No results for input(s): CHOL, HDL, LDLCALC, TRIG, CHOLHDL, LDLDIRECT in the last 72 hours. Thyroid  Function Tests: No results for input(s): TSH, T4TOTAL, FREET4, T3FREE, THYROIDAB in the last 72 hours. Anemia Panel: No results for input(s): VITAMINB12, FOLATE, FERRITIN, TIBC, IRON, RETICCTPCT in the last 72 hours. Urine analysis:    Component Value Date/Time   COLORURINE AMBER (A) 08/17/2016 0129   APPEARANCEUR HAZY (A) 08/17/2016 0129   LABSPEC 1.016 08/17/2016 0129    PHURINE 5.0 08/17/2016 0129   GLUCOSEU 50 (A) 08/17/2016 0129   HGBUR SMALL (A) 08/17/2016 0129   BILIRUBINUR NEGATIVE 08/17/2016 0129   KETONESUR NEGATIVE 08/17/2016 0129   PROTEINUR >=300 (A) 08/17/2016 0129   UROBILINOGEN 0.2 09/02/2009 0935   NITRITE NEGATIVE 08/17/2016 0129   LEUKOCYTESUR NEGATIVE 08/17/2016 0129     Domenic Polite M.D. [facility] 12/02/2017, 12:31 PM  Page via [email or web address] - password [email or web address]  Call night coverage person covering after 7pm

## 2017-12-03 LAB — RENAL FUNCTION PANEL
ALBUMIN: 1.8 g/dL — AB (ref 3.5–5.0)
ANION GAP: 11 (ref 5–15)
BUN: 71 mg/dL — ABNORMAL HIGH (ref 6–20)
CO2: 26 mmol/L (ref 22–32)
Calcium: 8.4 mg/dL — ABNORMAL LOW (ref 8.9–10.3)
Chloride: 101 mmol/L (ref 101–111)
Creatinine, Ser: 3.5 mg/dL — ABNORMAL HIGH (ref 0.61–1.24)
GFR calc non Af Amer: 20 mL/min — ABNORMAL LOW (ref >60)
GFR, EST AFRICAN AMERICAN: 24 mL/min — AB (ref >60)
GLUCOSE: 127 mg/dL — AB (ref 65–99)
PHOSPHORUS: 4 mg/dL (ref 2.5–4.6)
POTASSIUM: 3.4 mmol/L — AB (ref 3.5–5.1)
Sodium: 138 mmol/L (ref 135–145)

## 2017-12-03 LAB — GLUCOSE, CAPILLARY
Glucose-Capillary: 147 mg/dL — ABNORMAL HIGH (ref 65–99)
Glucose-Capillary: 140 mg/dL — ABNORMAL HIGH (ref 65–99)
Glucose-Capillary: 128 mg/dL — ABNORMAL HIGH (ref 65–99)
Glucose-Capillary: 133 mg/dL — ABNORMAL HIGH (ref 65–99)

## 2017-12-03 LAB — BASIC METABOLIC PANEL
ANION GAP: 11 (ref 5–15)
BUN: 70 mg/dL — ABNORMAL HIGH (ref 6–20)
CALCIUM: 8.5 mg/dL — AB (ref 8.9–10.3)
CO2: 26 mmol/L (ref 22–32)
CREATININE: 3.47 mg/dL — AB (ref 0.61–1.24)
Chloride: 101 mmol/L (ref 101–111)
GFR, EST AFRICAN AMERICAN: 24 mL/min — AB (ref >60)
GFR, EST NON AFRICAN AMERICAN: 21 mL/min — AB (ref >60)
Glucose, Bld: 131 mg/dL — ABNORMAL HIGH (ref 65–99)
Potassium: 3.6 mmol/L (ref 3.5–5.1)
Sodium: 138 mmol/L (ref 135–145)

## 2017-12-03 LAB — CULTURE, BLOOD (ROUTINE X 2)
Culture: NO GROWTH
Culture: NO GROWTH
Special Requests: ADEQUATE
Special Requests: ADEQUATE

## 2017-12-03 LAB — MAGNESIUM: Magnesium: 2 mg/dL (ref 1.7–2.4)

## 2017-12-03 MED ORDER — HYDROCORTISONE 0.5 % EX CREA
1.0000 "application " | TOPICAL_CREAM | Freq: Four times a day (QID) | CUTANEOUS | Status: DC | PRN
  Administered 2017-12-04: 1 via TOPICAL
  Filled 2017-12-03: qty 28.35

## 2017-12-03 NOTE — Progress Notes (Signed)
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 aspirin EC  81 mg Oral Daily  . carvedilol  12.5 mg Oral BID WC  . cephALEXin  250 mg Oral Q12H  . ferrous sulfate  325 mg Oral BID WC  . heparin  5,000 Units Subcutaneous Q8H  . insulin aspart  0-20 Units Subcutaneous TID WC  . insulin aspart  0-5 Units Subcutaneous QHS  . insulin glargine  5 Units Subcutaneous QHS  . metolazone  5 mg Oral Daily  . silver sulfADIAZINE   Topical Daily   Continuous Infusions: . furosemide Stopped (12/03/17 0553)   PRN Meds:.acetaminophen, loperamide, ondansetron (ZOFRAN) IV, oxyCODONE-acetaminophen  Current Labs: reviewed  6/11 TTE LVEF 15 to 20% with biventricular failure   Physical Exam:  Blood pressure 115/78, pulse 83, temperature 98.5 F (36.9 C), temperature source Oral, resp. rate 14, height 5\' 11"  (1.803 m), weight (!) 146.2 kg (322 lb 4.8 oz), SpO2 100 %. NAD, MOrbidly obese RRR w/o mgr nl s1s2 CTAB Massive LEE, tight, into proximal legs, abd wall, scrotum Distal legs wrapped Wound on r 1st toe No asterixus  A 1. CKD5, Massive Hypervolemia now responding to high dose diuretics 2. +Fhx of ESRD and Dialysis (father, brother) 3. Chronic sCHF, binventricular; decompensated; LVEF 15% 4. Morbid Obesity 5. Venous stasis ulcer on R toe 6. DM2 uncontrolled A1c 11.5%  P 1. Thankfully, he is now  responding to diuretics and no longer with concern for immediate need for dialysis  2. His best interest is for placement of permanent access as I expect these problems to recur; he refuses 3. Continue diuretics without changes 4. Follow twice daily labs given his significant diuresis 5. Daily weights, Daily Renal Panel, Strict I/Os, Avoid nephrotoxins (NSAIDs, judicious IV Contrast) 6. Medication Issues; Preferred narcotic agents for pain control are hydromorphone, fentanyl, and methadone. Morphine should not be used. Oxydone is not preferred.  Baclofen should not be used. Avoid Fleets and Magnesium Citrate Bowel Preps    Pearson Grippe MD 12/03/2017, 7:42 AM  Recent Labs  Lab 11/28/17 0407  12/01/17 0522 12/02/17 0405 12/02/17 1556  NA 136  136   < > 139 139 137  K 4.2  4.2   < > 3.6 3.4* 3.6  CL 102  105   < > 104 102 102  CO2 22  23   < > 24 27 26   GLUCOSE 163*  162*   < > 98 105* 105*  BUN 72*  73*   < > 82* 79* 75*  CREATININE 5.51*  5.39*   < > 4.78* 4.25* 3.92*  CALCIUM 7.4*  7.3*   < > 8.0* 8.2* 8.2*  PHOS 5.6*  --   --   --   --    < > = values in this interval not displayed.   Recent Labs  Lab 11/27/17 1422 12/01/17 0522 12/02/17 0405  WBC  13.1* 8.2 7.7  NEUTROABS 11.8*  --   --   HGB 11.2* 11.4* 11.4*  HCT 35.5* 35.6* 35.3*  MCV 88.5 87.7 87.2  PLT 205 269 330

## 2017-12-03 NOTE — Progress Notes (Signed)
[facility]                                                                              Patient Demographics  Joshua Massey, is a 42 y.o. male, DOB - Feb 24, 1976, BWI:[id number]  Admit date - 11/27/2017   Admitting Physician Joshua Norlander, MD  Outpatient Primary MD for the patient is [name], [name], MD  Outpatient specialists:   LOS - 6  days   Medical records reviewed and are as summarized below:    No chief complaint on file.      Brief summary   Joshua Massey is a 42 y.o. male with h/o chronic systolic CHF, DM2, HTN, CKD stage 3, GERD who has not been seen by a physician in almost a year, who presented to the ED with c/o "extra fluid" and weight gain. Over the past 4-5 weeks pt has gained approx 60 lbs, up to 350 lbs today from his baseline of 290 lb. He has had progressive BLE edema R>L as well, especially over the last week. He denied dyspnea at rest but has had increased DOE for the last 2 weeks and due to the swelling in his legs, he has been unable to ambulate for the last 2 days. He reported no chest pain, orthopnea or palpitations. He reported that he "went off a low sodium diet" approx 2 months ago because he was "just tired" of it. He admitted to eating liberal amounts of salty foods since then. Patient was admitted for further work-up.  Assessment & Plan   AKI on CKD3/acute on chronic systolic CHF -EF is 63-84% down from 25% last year, nonischemic cardiomyopathy-has not followed at the heart failure clinic in over one year -Nephrology following, on high-dose IV Lasix 160 mg every 6 and metolazone  -had fairly poor response to diuretics initially,  Followed by aggressive diuresis in the past 72 hours, he is -10.2 L, weight down 28 pounds  -creatinine down from 5.5 -> 4.8-> 4.2->3.9 now,  previous baseline was 2.6 in 12/2016 -we have continued to recommend AV fistula for future dialysis needs, patient remains in denial and poor health literacy is  contributing  Acute on chronic systolic CHF -previously EF was 25%, now dropped to 15-20% -Per cardiology notes has NICM, had normal coronaries in 2015 -continue Coreg, stopped hydralazine and Imdur due to soft BPs, to allow more room for aggressive diuresis  Diabetes mellitus with nephropathy, uncontrolled, insulin-dependent, type II -Hemoglobin A1c 11.5,  -cutdown Lantus dose due to worsening renal failure -CABG stable now  Morbid obesity -Patient counseled on diet and weight control -BMI 50.94  Noncompliance -significant challenge in this complex patient with multisystem failure and uncontrolled diabetes  Hypertension -BP currently soft, -continue Coreg, I have discontinued Imdur and stopped hydralazine  Right lower extremity cellulitis, right great toe diabetic foot ulcer -Continue clindamycin, wound care consulted - Joshua Massey consulted, local care and follow-up recommended -Changed clindamycin to oral Keflex -continue for 1 more day  Code Status: Full CODE STATUS DVT Prophylaxis: Heparin SQ Family Communication: Discussed in detail with the patient, all imaging results, lab results explained to the patient  Disposition Plan:at home in 2-3 days as long as volume status continues to improve  Time Spent in minutes   35 minutes  Procedures:  None Consultants:   Nephrology Orthopedics Joshua. Sharol Massey  Antimicrobials:   IV clindamycin 6/10   Medications  Scheduled Meds: . aspirin EC  81 mg Oral Daily  . carvedilol  12.5 mg Oral BID WC  . cephALEXin  250 mg Oral Q12H  . ferrous sulfate  325 mg Oral BID WC  . heparin  5,000 Units Subcutaneous Q8H  . insulin aspart  0-20 Units Subcutaneous TID WC  . insulin aspart  0-5 Units Subcutaneous QHS  . insulin glargine  5 Units Subcutaneous QHS  . metolazone  5 mg Oral Daily  . silver sulfADIAZINE   Topical Daily   Continuous Infusions: . furosemide Stopped (12/03/17 1053)   PRN Meds:.acetaminophen, loperamide, ondansetron  (ZOFRAN) IV, oxyCODONE-acetaminophen   Antibiotics   Anti-infectives (From admission, onward)   Start     Dose/Rate Route Frequency Ordered Stop   11/30/17 2000  cephALEXin (KEFLEX) capsule 250 mg    Note to Pharmacy:  Please adjust dose for GFR as needed   250 mg Oral Every 12 hours 11/30/17 1350     11/27/17 2200  clindamycin (CLEOCIN) IVPB 600 mg  Status:  Discontinued     600 mg 100 mL/hr over 30 Minutes Intravenous Every 8 hours 11/27/17 2116 11/30/17 1350   11/27/17 1645  clindamycin (CLEOCIN) IVPB 600 mg     600 mg 100 mL/hr over 30 Minutes Intravenous  Once 11/27/17 1637 11/27/17 1722        Subjective:  -Feeling better, was able to ambulate some yesterday, continues to have brisk urine output  Objective:   Vitals:   12/02/17 0500 12/02/17 1155 12/02/17 1925 12/03/17 0438  BP: 108/80 125/80 (!) 123/91 115/78  Pulse: (!) 107 84 (!) 107 83  Resp: 18 18 16 14   Temp: 98 F (36.7 C) 97.7 F (36.5 C) 98.5 F (36.9 C) 98.5 F (36.9 C)  TempSrc: Oral Oral Oral Oral  SpO2: 97% 97% 96% 100%  Weight:    (!) 146.2 kg (322 lb 4.8 oz)  Height:    5\' 11"  (1.803 m)    Intake/Output Summary (Last 24 hours) at 12/03/2017 1200 Last data filed at 12/03/2017 1134 Gross per 24 hour  Intake 977.3 ml  Output 7915 ml  Net -6937.7 ml     Wt Readings from Last 3 Encounters:  12/03/17 (!) 146.2 kg (322 lb 4.8 oz)  12/20/16 118.9 kg (262 lb 1 oz)  12/16/16 118.9 kg (262 lb 3.2 oz)     Exam Gen: Awake, Alert, Oriented X 3, obese, chronically ill-appearing HEENT: PERRLA, Neck supple, no JVD Lungs: decreased breath sounds at both bases CVS: RRR,No Gallops,Rubs or new Murmurs Abd: soft, Non tender, non distended, BS present Scrotal swelling improving Extremities: 2+ edema, swelling much improved Skin: no new rashes Psych: Normal affect and demeanor, alert and oriented x3    Data Reviewed:  I have personally reviewed following labs and imaging studies  Micro  Results Recent Results (from the past 240 hour(s))  Culture, blood (routine x 2)     Status: None   Collection Time: 11/28/17 12:46 AM  Result Value Ref Range Status   Specimen Description BLOOD RIGHT ANTECUBITAL  Final   Special Requests   Final    BOTTLES DRAWN AEROBIC AND ANAEROBIC Blood Culture adequate volume   Culture   Final  NO GROWTH 5 DAYS Performed at [facility], [place] [address] [place], [place] [address]    Report Status 12/03/2017 FINAL  Final  Culture, blood (routine x 2)     Status: None   Collection Time: 11/28/17 12:46 AM  Result Value Ref Range Status   Specimen Description BLOOD RIGHT ANTECUBITAL  Final   Special Requests   Final    BOTTLES DRAWN AEROBIC AND ANAEROBIC Blood Culture adequate volume   Culture   Final    NO GROWTH 5 DAYS Performed at [facility], [place] [address] [place], [place] [address]    Report Status 12/03/2017 FINAL  Final    Radiology Reports Dg Chest 2 View  Result Date: 11/27/2017 CLINICAL DATA:  Increased fluid buildup.  Weight gain. EXAM: CHEST - 2 VIEW COMPARISON:  08/16/2016 FINDINGS: There is marked enlargement of the cardiac silhouette. No pleural effusion or edema. No airspace opacities identified. The visualized osseous structures appear intact. IMPRESSION: 1. No acute cardiopulmonary abnormalities. 2. Cardiac enlargement as before. Electronically Signed   By: Kerby Moors M.D.   On: 11/27/2017 15:00   Dg Toe Great Right  Result Date: 11/27/2017 CLINICAL DATA:  Foot ulcer. EXAM: RIGHT GREAT TOE COMPARISON:  None. FINDINGS: Three-view exam of the right great toe shows a soft tissue defect compatible with ulcer. No underlying bony destruction or change to suggest osteomyelitis by plain film exam. Soft tissue swelling is evident. IMPRESSION: No radiographic evidence for osteomyelitis. Electronically Signed   By: Misty Stanley M.D.   On: 11/27/2017 14:57    Lab Data:  CBC: Recent Labs  Lab 11/27/17 1422  12/01/17 0522 12/02/17 0405  WBC 13.1* 8.2 7.7  NEUTROABS 11.8*  --   --   HGB 11.2* 11.4* 11.4*  HCT 35.5* 35.6* 35.3*  MCV 88.5 87.7 87.2  PLT 205 269 397   Basic Metabolic Panel: Recent Labs  Lab 11/28/17 0407 11/29/17 0802 12/01/17 0522 12/02/17 0405 12/02/17 1556 12/03/17 0831  NA 136  136 138 139 139 137 138  K 4.2  4.2 3.9 3.6 3.4* 3.6 3.4*  CL 102  105 103 104 102 102 101  CO2 22  23 23 24 27 26 26   GLUCOSE 163*  162* 94 98 105* 105* 127*  BUN 72*  73* 79* 82* 79* 75* 71*  CREATININE 5.51*  5.39* 5.32* 4.78* 4.25* 3.92* 3.50*  CALCIUM 7.4*  7.3* 7.7* 8.0* 8.2* 8.2* 8.4*  MG  --   --   --   --  2.0 2.0  PHOS 5.6*  --   --   --   --  4.0   GFR: Estimated Creatinine Clearance: 41.2 mL/min (A) (by C-Massey formula based on SCr of 3.5 mg/dL (H)). Liver Function Tests: Recent Labs  Lab 11/27/17 1422 11/28/17 0407 12/03/17 0831  AST 69* 58*  --   ALT 38 37  --   ALKPHOS 79 72  --   BILITOT 2.3* 1.9*  --   PROT 6.2* 5.9*  --   ALBUMIN 2.0* 1.9*  1.8* 1.8*   No results for input(s): LIPASE, AMYLASE in the last 168 hours. No results for input(s): AMMONIA in the last 168 hours. Coagulation Profile: No results for input(s): INR, PROTIME in the last 168 hours. Cardiac Enzymes: Recent Labs  Lab 11/27/17 2255 11/28/17 0407  TROPONINI 0.04* 0.10*   BNP (last 3 results) No results for input(s): PROBNP in the last 8760 hours. HbA1C: No results for input(s): HGBA1C  in the last 72 hours. CBG: Recent Labs  Lab 12/02/17 1151 12/02/17 1601 12/02/17 2102 12/03/17 0725 12/03/17 1134  GLUCAP 128* 117* 105* 147* 140*   Lipid Profile: No results for input(s): CHOL, HDL, LDLCALC, TRIG, CHOLHDL, LDLDIRECT in the last 72 hours. Thyroid Function Tests: No results for input(s): TSH, T4TOTAL, FREET4, T3FREE, THYROIDAB in the last 72 hours. Anemia Panel: No results for input(s): VITAMINB12, FOLATE, FERRITIN, TIBC, IRON, RETICCTPCT in the last 72 hours. Urine  analysis:    Component Value Date/Time   COLORURINE AMBER (A) 08/17/2016 0129   APPEARANCEUR HAZY (A) 08/17/2016 0129   LABSPEC 1.016 08/17/2016 0129   PHURINE 5.0 08/17/2016 0129   GLUCOSEU 50 (A) 08/17/2016 0129   HGBUR SMALL (A) 08/17/2016 0129   BILIRUBINUR NEGATIVE 08/17/2016 0129   KETONESUR NEGATIVE 08/17/2016 0129   PROTEINUR >=300 (A) 08/17/2016 0129   UROBILINOGEN 0.2 09/02/2009 0935   NITRITE NEGATIVE 08/17/2016 0129   LEUKOCYTESUR NEGATIVE 08/17/2016 0129     Domenic Polite M.D. [facility] 12/03/2017, 12:00 PM  Page via [email or web address] - password [email or web address]  Call night coverage person covering after 7pm

## 2017-12-03 NOTE — Progress Notes (Signed)
Resumed care at 2 AM. Patient resting, no complains or concerns.  Will continue to monitor.  Lavender Stanke, RN

## 2017-12-04 LAB — RENAL FUNCTION PANEL
Albumin: 1.9 g/dL — ABNORMAL LOW (ref 3.5–5.0)
Anion gap: 11 (ref 5–15)
BUN: 67 mg/dL — AB (ref 6–20)
CALCIUM: 8.6 mg/dL — AB (ref 8.9–10.3)
CHLORIDE: 99 mmol/L — AB (ref 101–111)
CO2: 28 mmol/L (ref 22–32)
CREATININE: 3.35 mg/dL — AB (ref 0.61–1.24)
GFR calc Af Amer: 25 mL/min — ABNORMAL LOW (ref >60)
GFR calc non Af Amer: 21 mL/min — ABNORMAL LOW (ref >60)
GLUCOSE: 129 mg/dL — AB (ref 65–99)
Phosphorus: 3.9 mg/dL (ref 2.5–4.6)
Potassium: 3.3 mmol/L — ABNORMAL LOW (ref 3.5–5.1)
SODIUM: 138 mmol/L (ref 135–145)

## 2017-12-04 LAB — GLUCOSE, CAPILLARY
Glucose-Capillary: 112 mg/dL — ABNORMAL HIGH (ref 65–99)
Glucose-Capillary: 135 mg/dL — ABNORMAL HIGH (ref 65–99)
Glucose-Capillary: 110 mg/dL — ABNORMAL HIGH (ref 65–99)
Glucose-Capillary: 139 mg/dL — ABNORMAL HIGH (ref 65–99)

## 2017-12-04 LAB — MAGNESIUM: Magnesium: 2 mg/dL (ref 1.7–2.4)

## 2017-12-04 MED ORDER — POTASSIUM CHLORIDE 20 MEQ PO PACK
40.0000 meq | PACK | Freq: Once | ORAL | Status: AC
  Administered 2017-12-04: 40 meq via ORAL
  Filled 2017-12-04: qty 2

## 2017-12-04 MED ORDER — FUROSEMIDE 10 MG/ML IJ SOLN
160.0000 mg | Freq: Three times a day (TID) | INTRAVENOUS | Status: DC
  Administered 2017-12-04 – 2017-12-05 (×2): 160 mg via INTRAVENOUS
  Filled 2017-12-04: qty 16
  Filled 2017-12-04: qty 14
  Filled 2017-12-04: qty 16

## 2017-12-04 NOTE — Progress Notes (Signed)
Unna boots off per MD order. No skin break down or weeping noted. Pedal pulses 2+. No other complaints at this time. Will continue to monitor patient.

## 2017-12-04 NOTE — Progress Notes (Signed)
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 aspirin EC  81 mg Oral Daily  . carvedilol  12.5 mg Oral BID WC  . cephALEXin  250 mg Oral Q12H  . ferrous sulfate  325 mg Oral BID WC  . heparin  5,000 Units Subcutaneous Q8H  . insulin aspart  0-20 Units Subcutaneous TID WC  . insulin aspart  0-5 Units Subcutaneous QHS  . insulin glargine  5 Units Subcutaneous QHS  . metolazone  5 mg Oral Daily  . potassium chloride  40 mEq Oral Once  . silver sulfADIAZINE   Topical Daily   Continuous Infusions: . furosemide     PRN Meds:.acetaminophen, hydrocortisone cream, loperamide, ondansetron (ZOFRAN) IV, oxyCODONE-acetaminophen  Current Labs: reviewed  6/11 TTE LVEF 15 to 20% with biventricular failure   Physical Exam:  Blood pressure 122/81, pulse 81, temperature 98.4 F (36.9 C), temperature source Oral, resp. rate 20, height 5\' 11"  (1.803 m), weight (!) 139.8 kg (308 lb 1.6 oz), SpO2 98 %. NAD, MOrbidly obese RRR w/o mgr nl s1s2 CTAB Massive LEE, tight, into proximal legs, abd wall, scrotum Distal legs wrapped Wound on r 1st toe No asterixis  A/P 1. CKD5, Massive Hypervolemia now responding to high dose diuretics.  Will decrease frequency to Lasix 160 IV q 8 with metolazone.  He needs permanent access as dialysis likely in his future.   2. Mild hypokalemia- repleting today 3. +Fhx of ESRD and Dialysis (father, brother) 2. Chronic sCHF, binventricular; decompensated; LVEF 15% 5. Morbid Obesity 6. Venous stasis ulcer on R toe 7. DM2 uncontrolled  A1c 11.5%   [name] MD 12/04/2017, 10:41 AM  Recent Labs  Lab 11/28/17 0407  12/03/17 0831 12/03/17 1547 12/04/17 0443  NA 136  136   < > 138 138 138  K 4.2  4.2   < > 3.4* 3.6 3.3*  CL 102  105   < > 101 101 99*  CO2 22  23   < > 26 26 28   GLUCOSE 163*  162*   < > 127* 131* 129*  BUN 72*  73*   < > 71* 70* 67*  CREATININE 5.51*  5.39*   < > 3.50* 3.47* 3.35*  CALCIUM 7.4*  7.3*   < > 8.4* 8.5* 8.6*  PHOS 5.6*  --  4.0  --  3.9   < > = values in this interval not displayed.   Recent Labs  Lab 11/27/17 1422 12/01/17 0522 12/02/17 0405  WBC 13.1* 8.2 7.7  NEUTROABS 11.8*  --   --   HGB 11.2* 11.4* 11.4*  HCT 35.5* 35.6* 35.3*  MCV 88.5 87.7 87.2  PLT 205 269 330

## 2017-12-04 NOTE — Progress Notes (Signed)
[facility]                                                                              Patient Demographics  Joshua Massey, is a 42 y.o. male, DOB - Oct 24, 1975, RJJ:[id number]  Admit date - 11/27/2017   Admitting Physician Janora Norlander, MD  Outpatient Primary MD for the patient is [name], [name], MD  Outpatient specialists:   LOS - 7  days   Medical records reviewed and are as summarized below:    No chief complaint on file.      Brief summary   Joshua Massey is a 42 y.o. male with h/o chronic systolic CHF, DM2, HTN, CKD stage 3, GERD who has not been seen by a physician in almost a year, who presented to the ED with c/o "extra fluid" and weight gain. Over the past 4-5 weeks pt has gained approx 60 lbs, up to 350 lbs today from his baseline of 290 lb. He has had progressive BLE edema R>L as well, especially over the last week. He denied dyspnea at rest but has had increased DOE for the last 2 weeks and due to the swelling in his legs, he has been unable to ambulate for the last 2 days. He reported no chest pain, orthopnea or palpitations. He reported that he "went off a low sodium diet" approx 2 months ago because he was "just tired" of it. He admitted to eating liberal amounts of salty foods since then. Patient was admitted for further work-up.  Assessment & Plan   AKI on CKD3/acute on chronic systolic CHF -EF is 01-60% down from 25% last year, nonischemic cardiomyopathy-has not followed at the heart failure clinic in over one year -Nephrology following, on high-dose IV Lasix 160 and metolazone  -had fairly poor response to diuretics initially,  followed by aggressive diuresis in the past 4days, he is -15.2 L, weight down 42 pounds  -creatinine down from 5.5 -> 4.8-> 4.2->3.9->3.5 now,  previous baseline was 2.6 in 12/2016 -we have continued to recommend AV fistula for future dialysis needs, patient remains in denial and poor health literacy is  contributing  Acute on chronic systolic CHF -previously EF was 25%, now dropped to 15-20% -Per cardiology notes has NICM, had normal coronaries in 2015 -continue Coreg, stopped hydralazine and Imdur due to soft BPs, to allow more room for aggressive diuresis  Diabetes mellitus with nephropathy, uncontrolled, insulin-dependent, type II -Hemoglobin A1c 11.5,  -cutdown Lantus dose due to worsening renal failure -CABG stable now  Morbid obesity -Patient counseled on diet and weight control -BMI 50.94  Noncompliance -significant challenge in this complex patient with multisystem failure and uncontrolled diabetes  Hypertension -BP currently soft, -continue Coreg, I have discontinued Imdur and stopped hydralazine  Right lower extremity cellulitis, right great toe diabetic foot ulcer -Continue clindamycin, wound care consulted - Dr Sharol Given consulted, local care and follow-up recommended -Changed clindamycin to oral Keflex , completed course, stop antibiotics -DC UNNA boots  Code Status: Full CODE STATUS DVT Prophylaxis: Heparin SQ Family Communication: Discussed in detail with the patient, all imaging results, lab results explained to the patient  Disposition Plan:at home in 2-3 days as long as volume status continues to improve  Time Spent in minutes   35 minutes  Procedures:  None Consultants:   Nephrology Orthopedics Dr. Sharol Given  Antimicrobials:   IV clindamycin 6/10   Medications  Scheduled Meds: . aspirin EC  81 mg Oral Daily  . carvedilol  12.5 mg Oral BID WC  . cephALEXin  250 mg Oral Q12H  . ferrous sulfate  325 mg Oral BID WC  . heparin  5,000 Units Subcutaneous Q8H  . insulin aspart  0-20 Units Subcutaneous TID WC  . insulin aspart  0-5 Units Subcutaneous QHS  . insulin glargine  5 Units Subcutaneous QHS  . metolazone  5 mg Oral Daily  . potassium chloride  40 mEq Oral Once  . silver sulfADIAZINE   Topical Daily   Continuous Infusions: . furosemide      PRN Meds:.acetaminophen, hydrocortisone cream, loperamide, ondansetron (ZOFRAN) IV, oxyCODONE-acetaminophen   Antibiotics   Anti-infectives (From admission, onward)   Start     Dose/Rate Route Frequency Ordered Stop   11/30/17 2000  cephALEXin (KEFLEX) capsule 250 mg    Note to Pharmacy:  Please adjust dose for GFR as needed   250 mg Oral Every 12 hours 11/30/17 1350     11/27/17 2200  clindamycin (CLEOCIN) IVPB 600 mg  Status:  Discontinued     600 mg 100 mL/hr over 30 Minutes Intravenous Every 8 hours 11/27/17 2116 11/30/17 1350   11/27/17 1645  clindamycin (CLEOCIN) IVPB 600 mg     600 mg 100 mL/hr over 30 Minutes Intravenous  Once 11/27/17 1637 11/27/17 1722        Subjective:  -feels better, continues to have aggressive urine output  Objective:   Vitals:   12/03/17 2028 12/04/17 0351 12/04/17 0402 12/04/17 1216  BP: 138/89 122/81  131/88  Pulse: 81 81  80  Resp: 14 20    Temp: 99.1 F (37.3 C) 98.4 F (36.9 C)  98.4 F (36.9 C)  TempSrc: Oral Oral  Oral  SpO2: 100% 98%  96%  Weight:   (!) 139.8 kg (308 lb 1.6 oz)   Height:        Intake/Output Summary (Last 24 hours) at 12/04/2017 1340 Last data filed at 12/04/2017 0956 Gross per 24 hour  Intake 949.4 ml  Output 3751 ml  Net -2801.6 ml     Wt Readings from Last 3 Encounters:  12/04/17 (!) 139.8 kg (308 lb 1.6 oz)  12/20/16 118.9 kg (262 lb 1 oz)  12/16/16 118.9 kg (262 lb 3.2 oz)     Exam Gen: Awake, Alert, Oriented X 3, obese, chronically ill-appearing HEENT: PERRLA, Neck supple, +JVD Lungs: decreased breath sounds at both bases CVS: RRR,No Gallops,Rubs or new Murmurs Abd: soft, Non tender, non distended, BS present Extremities: edema extending to upper thighs, abdominal wall, scrotum, less tense, improving Skin: Unna boots on both lower legs, venous stasis ulcers Psych: Normal affect and demeanor, alert and oriented x3    Data Reviewed:  I have personally reviewed following labs and imaging  studies  Micro Results Recent Results (from the past 240 hour(s))  Culture, blood (routine x 2)     Status: None   Collection Time: 11/28/17 12:46 AM  Result Value Ref Range Status   Specimen Description BLOOD RIGHT ANTECUBITAL  Final   Special Requests   Final    BOTTLES DRAWN AEROBIC AND ANAEROBIC Blood Culture adequate volume   Culture   Final  NO GROWTH 5 DAYS Performed at [facility], [place] [address] [place], [place] [address]    Report Status 12/03/2017 FINAL  Final  Culture, blood (routine x 2)     Status: None   Collection Time: 11/28/17 12:46 AM  Result Value Ref Range Status   Specimen Description BLOOD RIGHT ANTECUBITAL  Final   Special Requests   Final    BOTTLES DRAWN AEROBIC AND ANAEROBIC Blood Culture adequate volume   Culture   Final    NO GROWTH 5 DAYS Performed at [facility], [place] [address], [place], [place] [address]    Report Status 12/03/2017 FINAL  Final    Radiology Reports Dg Chest 2 View  Result Date: 11/27/2017 CLINICAL DATA:  Increased fluid buildup.  Weight gain. EXAM: CHEST - 2 VIEW COMPARISON:  08/16/2016 FINDINGS: There is marked enlargement of the cardiac silhouette. No pleural effusion or edema. No airspace opacities identified. The visualized osseous structures appear intact. IMPRESSION: 1. No acute cardiopulmonary abnormalities. 2. Cardiac enlargement as before. Electronically Signed   By: Kerby Moors M.D.   On: 11/27/2017 15:00   Dg Toe Great Right  Result Date: 11/27/2017 CLINICAL DATA:  Foot ulcer. EXAM: RIGHT GREAT TOE COMPARISON:  None. FINDINGS: Three-view exam of the right great toe shows a soft tissue defect compatible with ulcer. No underlying bony destruction or change to suggest osteomyelitis by plain film exam. Soft tissue swelling is evident. IMPRESSION: No radiographic evidence for osteomyelitis. Electronically Signed   By: Misty Stanley M.D.   On: 11/27/2017 14:57    Lab Data:  CBC: Recent Labs  Lab  11/27/17 1422 12/01/17 0522 12/02/17 0405  WBC 13.1* 8.2 7.7  NEUTROABS 11.8*  --   --   HGB 11.2* 11.4* 11.4*  HCT 35.5* 35.6* 35.3*  MCV 88.5 87.7 87.2  PLT 205 269 633   Basic Metabolic Panel: Recent Labs  Lab 11/28/17 0407  12/02/17 0405 12/02/17 1556 12/03/17 0831 12/03/17 1547 12/04/17 0443  NA 136  136   < > 139 137 138 138 138  K 4.2  4.2   < > 3.4* 3.6 3.4* 3.6 3.3*  CL 102  105   < > 102 102 101 101 99*  CO2 22  23   < > 27 26 26 26 28   GLUCOSE 163*  162*   < > 105* 105* 127* 131* 129*  BUN 72*  73*   < > 79* 75* 71* 70* 67*  CREATININE 5.51*  5.39*   < > 4.25* 3.92* 3.50* 3.47* 3.35*  CALCIUM 7.4*  7.3*   < > 8.2* 8.2* 8.4* 8.5* 8.6*  MG  --   --   --  2.0 2.0  --  2.0  PHOS 5.6*  --   --   --  4.0  --  3.9   < > = values in this interval not displayed.   GFR: Estimated Creatinine Clearance: 41.9 mL/min (A) (by C-G formula based on SCr of 3.35 mg/dL (H)). Liver Function Tests: Recent Labs  Lab 11/27/17 1422 11/28/17 0407 12/03/17 0831 12/04/17 0443  AST 69* 58*  --   --   ALT 38 37  --   --   ALKPHOS 79 72  --   --   BILITOT 2.3* 1.9*  --   --   PROT 6.2* 5.9*  --   --   ALBUMIN 2.0* 1.9*  1.8* 1.8* 1.9*   No results for input(s): LIPASE, AMYLASE  in the last 168 hours. No results for input(s): AMMONIA in the last 168 hours. Coagulation Profile: No results for input(s): INR, PROTIME in the last 168 hours. Cardiac Enzymes: Recent Labs  Lab 11/27/17 2255 11/28/17 0407  TROPONINI 0.04* 0.10*   BNP (last 3 results) No results for input(s): PROBNP in the last 8760 hours. HbA1C: No results for input(s): HGBA1C in the last 72 hours. CBG: Recent Labs  Lab 12/03/17 1134 12/03/17 1613 12/03/17 2152 12/04/17 0724 12/04/17 1213  GLUCAP 140* 128* 133* 112* 135*   Lipid Profile: No results for input(s): CHOL, HDL, LDLCALC, TRIG, CHOLHDL, LDLDIRECT in the last 72 hours. Thyroid Function Tests: No results for input(s): TSH, T4TOTAL,  FREET4, T3FREE, THYROIDAB in the last 72 hours. Anemia Panel: No results for input(s): VITAMINB12, FOLATE, FERRITIN, TIBC, IRON, RETICCTPCT in the last 72 hours. Urine analysis:    Component Value Date/Time   COLORURINE AMBER (A) 08/17/2016 0129   APPEARANCEUR HAZY (A) 08/17/2016 0129   LABSPEC 1.016 08/17/2016 0129   PHURINE 5.0 08/17/2016 0129   GLUCOSEU 50 (A) 08/17/2016 0129   HGBUR SMALL (A) 08/17/2016 0129   BILIRUBINUR NEGATIVE 08/17/2016 0129   KETONESUR NEGATIVE 08/17/2016 0129   PROTEINUR >=300 (A) 08/17/2016 0129   UROBILINOGEN 0.2 09/02/2009 0935   NITRITE NEGATIVE 08/17/2016 0129   LEUKOCYTESUR NEGATIVE 08/17/2016 0129     Domenic Polite M.D. [facility] 12/04/2017, 1:40 PM   Time spent: 52min  Page via [email or web address] - password [email or web address]  Call night coverage person covering after 7pm

## 2017-12-04 NOTE — Progress Notes (Signed)
Physical Therapy Treatment Patient Details Name: Joshua Massey MRN: [id number] DOB: 1976-03-14 Today's Date: 12/04/2017    History of Present Illness 42 y.o. male with h/o chronic systolic CHF, DM2, HTN, CKD stage 3, GERD who has not been seen by a physician in almost a year, who presented to the ED with c/o "extra fluid" and weight gain    PT Comments    Pt tolerated increased distance with bariatric RW today and negotiated steps with min guard for safety. He is showing improvement with all mobilities and progressing towards goals. Pt would benefit from continued skilled PT to increase functional independence and return to PLOF.     Follow Up Recommendations  Home health PT;Supervision for mobility/OOB     Equipment Recommendations  Other (comment)(Bariatric RW)    Recommendations for Other Services       Precautions / Restrictions Precautions Precautions: Fall Restrictions Weight Bearing Restrictions: No    Mobility  Bed Mobility Overal bed mobility: Modified Independent Bed Mobility: Supine to Sit     Supine to sit: Modified independent (Device/Increase time)     General bed mobility comments: increased time and effort to perform with use of bed rails to come to EOB.  Transfers Overall transfer level: Needs assistance Equipment used: Rolling walker (2 wheeled)(Bariatric RW) Transfers: Sit to/from Stand Sit to Stand: From elevated surface;Supervision         General transfer comment: Supervision for safety. No assist or cues required to rise from elevated surface  Ambulation/Gait Ambulation/Gait assistance: Supervision Gait Distance (Feet): 500 Feet Assistive device: Rolling walker (2 wheeled)(bariatric RW) Gait Pattern/deviations: Wide base of support;Antalgic;Step-through pattern Gait velocity: decreasd   General Gait Details: Wide waddling gait secondary to LE and scrotal edema. Cues for RW proximity.   Stairs Stairs: Yes Stairs assistance: Min  guard Stair Management: Two rails;Step to pattern;Forwards Number of Stairs: 2 General stair comments: Increased time and effort to negotiate steps with min guard for safety.    Wheelchair Mobility    Modified Rankin (Stroke Patients Only)       Balance Overall balance assessment: Needs assistance Sitting-balance support: No upper extremity supported;Feet unsupported Sitting balance-Leahy Scale: Good     Standing balance support: During functional activity Standing balance-Leahy Scale: Fair Standing balance comment: able to statically stand w/o support. Required RW for ambulation.                            Cognition Arousal/Alertness: Awake/alert Behavior During Therapy: WFL for tasks assessed/performed Overall Cognitive Status: Within Functional Limits for tasks assessed                                        Exercises      General Comments General comments (skin integrity, edema, etc.): pt with c/o itchy skin, scratching back, hips, and thighs throughout session. RN notified.      Pertinent Vitals/Pain Pain Assessment: No/denies pain    Home Living                      Prior Function            PT Goals (current goals can now be found in the care plan section) Acute Rehab PT Goals Patient Stated Goal: to go home PT Goal Formulation: With patient Time For Goal Achievement: 12/13/17 Potential to Achieve Goals:  Good Progress towards PT goals: Progressing toward goals    Frequency    Min 3X/week      PT Plan Current plan remains appropriate;Equipment recommendations need to be updated    Co-evaluation              AM-PAC PT "6 Clicks" Daily Activity  Outcome Measure  Difficulty turning over in bed (including adjusting bedclothes, sheets and blankets)?: None Difficulty moving from lying on back to sitting on the side of the bed? : None Difficulty sitting down on and standing up from a chair with arms  (e.g., wheelchair, bedside commode, etc,.)?: A Little Help needed moving to and from a bed to chair (including a wheelchair)?: A Little Help needed walking in hospital room?: A Little Help needed climbing 3-5 steps with a railing? : A Little 6 Click Score: 20    End of Session Equipment Utilized During Treatment: Gait belt Activity Tolerance: Patient tolerated treatment well Patient left: in bed;with call bell/phone within reach(sitting EOB) Nurse Communication: Mobility status;Other (comment)(itching) PT Visit Diagnosis: Muscle weakness (generalized) (M62.81);Unsteadiness on feet (R26.81);Difficulty in walking, not elsewhere classified (R26.2)     Time: 3744-5146 PT Time Calculation (min) (ACUTE ONLY): 29 min  Charges:  $Gait Training: 23-37 mins                    G Codes:       Joshua Massey, [place] Pager [phone] Acute Rehab   Joshua Massey 12/04/2017, 10:39 AM

## 2017-12-05 LAB — MAGNESIUM: Magnesium: 1.8 mg/dL (ref 1.7–2.4)

## 2017-12-05 LAB — RENAL FUNCTION PANEL
ALBUMIN: 1.9 g/dL — AB (ref 3.5–5.0)
ANION GAP: 12 (ref 5–15)
BUN: 68 mg/dL — ABNORMAL HIGH (ref 6–20)
CO2: 28 mmol/L (ref 22–32)
Calcium: 8.3 mg/dL — ABNORMAL LOW (ref 8.9–10.3)
Chloride: 100 mmol/L — ABNORMAL LOW (ref 101–111)
Creatinine, Ser: 3.58 mg/dL — ABNORMAL HIGH (ref 0.61–1.24)
GFR, EST AFRICAN AMERICAN: 23 mL/min — AB (ref >60)
GFR, EST NON AFRICAN AMERICAN: 20 mL/min — AB (ref >60)
Glucose, Bld: 153 mg/dL — ABNORMAL HIGH (ref 65–99)
PHOSPHORUS: 4.2 mg/dL (ref 2.5–4.6)
POTASSIUM: 4.3 mmol/L (ref 3.5–5.1)
Sodium: 140 mmol/L (ref 135–145)

## 2017-12-05 LAB — GLUCOSE, CAPILLARY
Glucose-Capillary: 157 mg/dL — ABNORMAL HIGH (ref 65–99)
Glucose-Capillary: 125 mg/dL — ABNORMAL HIGH (ref 65–99)
Glucose-Capillary: 151 mg/dL — ABNORMAL HIGH (ref 65–99)
Glucose-Capillary: 114 mg/dL — ABNORMAL HIGH (ref 65–99)

## 2017-12-05 LAB — CBC
HEMATOCRIT: 37.5 % — AB (ref 39.0–52.0)
HEMOGLOBIN: 12 g/dL — AB (ref 13.0–17.0)
MCH: 28.2 pg (ref 26.0–34.0)
MCHC: 32 g/dL (ref 30.0–36.0)
MCV: 88 fL (ref 78.0–100.0)
Platelets: 429 10*3/uL — ABNORMAL HIGH (ref 150–400)
RBC: 4.26 MIL/uL (ref 4.22–5.81)
RDW: 17.2 % — ABNORMAL HIGH (ref 11.5–15.5)
WBC: 12.7 10*3/uL — AB (ref 4.0–10.5)

## 2017-12-05 MED ORDER — TORSEMIDE 20 MG PO TABS
60.0000 mg | ORAL_TABLET | Freq: Two times a day (BID) | ORAL | Status: DC
  Administered 2017-12-05 – 2017-12-06 (×3): 60 mg via ORAL
  Filled 2017-12-05 (×3): qty 3

## 2017-12-05 NOTE — Progress Notes (Signed)
catherer has been remove per MD order, patient is due to void between 330-5:30

## 2017-12-05 NOTE — Progress Notes (Signed)
Patient void 500 ml at 15:20 pm post catherer removal.

## 2017-12-05 NOTE — Progress Notes (Signed)
[facility]                                                                              Patient Demographics  Joshua Massey, is a 42 y.o. male, DOB - [birth year], IOX:[id number]  Admit date - 11/27/2017   Admitting Physician Janora Norlander, MD  Outpatient Primary MD for the patient is [name], [name], MD  Outpatient specialists:   LOS - 8  days   Medical records reviewed and are as summarized below:    No chief complaint on file.      Brief summary   Joshua Massey is a 42 y.o. male with h/o chronic systolic CHF EF 26%, DM2, HTN, CKD stage 3, GERD who has not been seen by a physician in almost a year, who presented to the ED with c/o "extra fluid" and weight gain. Over the past 4-5 weeks pt has gained approx 60 lbs, up to 350 lbs from his baseline of 290 lb. He has had progressive BLE edema R>L , scrotal swelling and creatinine was 5.5 on admission, from baseline of 2.5. -Renal consulted, started high dose diuretics, had minimal response for first 48hours and there was initial concern he might need HD, subsequently started aggressively diuresing and now he is 20L negative, weight down 50lbs and creatinine down to 3.5  Assessment & Plan   AKI on CKD3/acute on chronic systolic CHF -EF is 83-41% down from 25% last year, nonischemic cardiomyopathy-has not followed at the heart failure clinic in over one year -Nephrology following, has been on high-dose IV Lasix 160 and metolazone  -20L negative, weight down 50lbs -diuresis appears to be slowly, diuretics per Renal -creatinine down from 5.5 -> 4.8-> 4.2->3.9->3.5 now,  previous baseline was 2.6 in 12/2016 -we have continued to recommend AV fistula for future dialysis needs, patient decline this, remains in denial and unfortunately poor health literacy is contributing  Acute on chronic systolic CHF -previously EF was 25%, now dropped to 15-20% -Per cardiology notes has NICM, had normal coronaries in  2015 -continue Coreg, stopped hydralazine and Imdur due to soft BPs, to allow more room for diuresis -start low dose Bidil in next 1-2days  Diabetes mellitus with nephropathy, uncontrolled, insulin-dependent, type II -Hemoglobin A1c 11.5,  -cutdown Lantus dose due to worsening renal failure -CBGs stable now  Morbid obesity -Patient counseled on diet and weight control -BMI 50.94  Noncompliance -significant challenge in this complex patient with multisystem failure and uncontrolled diabetes -needs to re-establish with CHF clinic  Hypertension -BP currently soft, -continue Coreg, I have discontinued Imdur and stopped hydralazine  Right lower extremity cellulitis, right great toe diabetic foot ulcer - Continue clindamycin, wound care consulted - Dr Sharol Given consulted, local care and follow-up recommended - Changed clindamycin to oral Keflex , completed course, stopped antibiotics - DC UNNA boots  Code Status: Full CODE STATUS DVT Prophylaxis: Heparin SQ Family Communication: Discussed in detail with the patient, all imaging results, lab results explained to the patient  Disposition Plan:at home in 2-3 days as long as volume status continues to improve  Time Spent in minutes   35 minutes  Procedures:  None Consultants:   Nephrology Orthopedics Dr. Sharol Given  Antimicrobials:   IV clindamycin 6/10   Medications  Scheduled Meds: . aspirin EC  81 mg Oral Daily  . carvedilol  12.5 mg Oral BID WC  . ferrous sulfate  325 mg Oral BID WC  . heparin  5,000 Units Subcutaneous Q8H  . insulin aspart  0-20 Units Subcutaneous TID WC  . insulin aspart  0-5 Units Subcutaneous QHS  . insulin glargine  5 Units Subcutaneous QHS  . metolazone  5 mg Oral Daily  . silver sulfADIAZINE   Topical Daily  . torsemide  60 mg Oral BID   Continuous Infusions:  PRN Meds:.acetaminophen, hydrocortisone cream, ondansetron (ZOFRAN) IV, oxyCODONE-acetaminophen   Antibiotics   Anti-infectives (From  admission, onward)   Start     Dose/Rate Route Frequency Ordered Stop   11/30/17 2000  cephALEXin (KEFLEX) capsule 250 mg  Status:  Discontinued    Note to Pharmacy:  Please adjust dose for GFR as needed   250 mg Oral Every 12 hours 11/30/17 1350 12/04/17 1344   11/27/17 2200  clindamycin (CLEOCIN) IVPB 600 mg  Status:  Discontinued     600 mg 100 mL/hr over 30 Minutes Intravenous Every 8 hours 11/27/17 2116 11/30/17 1350   11/27/17 1645  clindamycin (CLEOCIN) IVPB 600 mg     600 mg 100 mL/hr over 30 Minutes Intravenous  Once 11/27/17 1637 11/27/17 1722        Subjective:  -feels ok, starting to ambulate more now  Objective:   Vitals:   12/05/17 0337 12/05/17 0359 12/05/17 0751 12/05/17 1159  BP:  135/87 117/88 117/77  Pulse:  85 (!) 127 85  Resp:  18 20 20   Temp:  97.8 F (36.6 C) 98.6 F (37 C) 98.6 F (37 C)  TempSrc:  Oral Oral Oral  SpO2:  97% 98% 98%  Weight: (!) 136.2 kg (300 lb 4.8 oz)     Height:        Intake/Output Summary (Last 24 hours) at 12/05/2017 1239 Last data filed at 12/05/2017 1025 Gross per 24 hour  Intake 840.1 ml  Output 3775 ml  Net -2934.9 ml     Wt Readings from Last 3 Encounters:  12/05/17 (!) 136.2 kg (300 lb 4.8 oz)  12/20/16 118.9 kg (262 lb 1 oz)  12/16/16 118.9 kg (262 lb 3.2 oz)     Exam Gen: Awake, Alert, Oriented X 3, obese, chronically ill-appearing HEENT: PERRLA, Neck supple, + JVD Lungs: distant breath sounds, decreased at both bases CVS: RRR,No Gallops,Rubs or new Murmurs Abd: soft, Non tender, non distended, BS present Extremities: edema extending to upper thighs, abdominal wall, scrotum, less tense, improving Skin: Unna boots on both lower legs, venous stasis ulcers Psych: Normal affect and demeanor, alert and oriented x3    Data Reviewed:  I have personally reviewed following labs and imaging studies  Micro Results Recent Results (from the past 240 hour(s))  Culture, blood (routine x 2)     Status: None    Collection Time: 11/28/17 12:46 AM  Result Value Ref Range Status   Specimen Description BLOOD RIGHT ANTECUBITAL  Final   Special Requests   Final    BOTTLES DRAWN AEROBIC AND ANAEROBIC Blood Culture adequate volume   Culture   Final    NO GROWTH 5 DAYS Performed at [facility], [address] [place], [place] [address]    Report Status 12/03/2017 FINAL  Final  Culture, blood (routine x 2)  Status: None   Collection Time: 11/28/17 12:46 AM  Result Value Ref Range Status   Specimen Description BLOOD RIGHT ANTECUBITAL  Final   Special Requests   Final    BOTTLES DRAWN AEROBIC AND ANAEROBIC Blood Culture adequate volume   Culture   Final    NO GROWTH 5 DAYS Performed at [facility], [address], [place], [place] [address]    Report Status 12/03/2017 FINAL  Final    Radiology Reports Dg Chest 2 View  Result Date: 11/27/2017 CLINICAL DATA:  Increased fluid buildup.  Weight gain. EXAM: CHEST - 2 VIEW COMPARISON:  08/16/2016 FINDINGS: There is marked enlargement of the cardiac silhouette. No pleural effusion or edema. No airspace opacities identified. The visualized osseous structures appear intact. IMPRESSION: 1. No acute cardiopulmonary abnormalities. 2. Cardiac enlargement as before. Electronically Signed   By: Kerby Moors M.D.   On: 11/27/2017 15:00   Dg Toe Great Right  Result Date: 11/27/2017 CLINICAL DATA:  Foot ulcer. EXAM: RIGHT GREAT TOE COMPARISON:  None. FINDINGS: Three-view exam of the right great toe shows a soft tissue defect compatible with ulcer. No underlying bony destruction or change to suggest osteomyelitis by plain film exam. Soft tissue swelling is evident. IMPRESSION: No radiographic evidence for osteomyelitis. Electronically Signed   By: Misty Stanley M.D.   On: 11/27/2017 14:57    Lab Data:  CBC: Recent Labs  Lab 12/01/17 0522 12/02/17 0405 12/05/17 0449  WBC 8.2 7.7 12.7*  HGB 11.4* 11.4* 12.0*  HCT 35.6* 35.3* 37.5*  MCV 87.7  87.2 88.0  PLT 269 330 329*   Basic Metabolic Panel: Recent Labs  Lab 12/02/17 1556 12/03/17 0831 12/03/17 1547 12/04/17 0443 12/05/17 0449  NA 137 138 138 138 140  K 3.6 3.4* 3.6 3.3* 4.3  CL 102 101 101 99* 100*  CO2 26 26 26 28 28   GLUCOSE 105* 127* 131* 129* 153*  BUN 75* 71* 70* 67* 68*  CREATININE 3.92* 3.50* 3.47* 3.35* 3.58*  CALCIUM 8.2* 8.4* 8.5* 8.6* 8.3*  MG 2.0 2.0  --  2.0 1.8  PHOS  --  4.0  --  3.9 4.2   GFR: Estimated Creatinine Clearance: 38.7 mL/min (A) (by C-G formula based on SCr of 3.58 mg/dL (H)). Liver Function Tests: Recent Labs  Lab 12/03/17 0831 12/04/17 0443 12/05/17 0449  ALBUMIN 1.8* 1.9* 1.9*   No results for input(s): LIPASE, AMYLASE in the last 168 hours. No results for input(s): AMMONIA in the last 168 hours. Coagulation Profile: No results for input(s): INR, PROTIME in the last 168 hours. Cardiac Enzymes: No results for input(s): CKTOTAL, CKMB, CKMBINDEX, TROPONINI in the last 168 hours. BNP (last 3 results) No results for input(s): PROBNP in the last 8760 hours. HbA1C: No results for input(s): HGBA1C in the last 72 hours. CBG: Recent Labs  Lab 12/04/17 1213 12/04/17 1639 12/04/17 2129 12/05/17 0733 12/05/17 1155  GLUCAP 135* 110* 139* 157* 125*   Lipid Profile: No results for input(s): CHOL, HDL, LDLCALC, TRIG, CHOLHDL, LDLDIRECT in the last 72 hours. Thyroid Function Tests: No results for input(s): TSH, T4TOTAL, FREET4, T3FREE, THYROIDAB in the last 72 hours. Anemia Panel: No results for input(s): VITAMINB12, FOLATE, FERRITIN, TIBC, IRON, RETICCTPCT in the last 72 hours. Urine analysis:    Component Value Date/Time   COLORURINE AMBER (A) 08/17/2016 0129   APPEARANCEUR HAZY (A) 08/17/2016 0129   LABSPEC 1.016 08/17/2016 0129   PHURINE 5.0 08/17/2016 0129   GLUCOSEU 50 (A) 08/17/2016 0129  HGBUR SMALL (A) 08/17/2016 0129   BILIRUBINUR NEGATIVE 08/17/2016 0129   KETONESUR NEGATIVE 08/17/2016 0129   PROTEINUR >=300  (A) 08/17/2016 0129   UROBILINOGEN 0.2 09/02/2009 0935   NITRITE NEGATIVE 08/17/2016 0129   LEUKOCYTESUR NEGATIVE 08/17/2016 0129     Domenic Polite M.D. [facility] 12/05/2017, 12:39 PM   Time spent: 69min  Page via [email or web address] - password [email or web address]  Call night coverage person covering after 7pm

## 2017-12-05 NOTE — Progress Notes (Signed)
  [facility] Progress Note    Assessment/ Plan:   1. CKD5: baseline creatinine 2.6 12/2016.  He had massive hypervolemia now responding to high dose diuretics. I'm going to change his diuretics to torsemide  60 BID and metolazone 5 mg daily in preparation for hopeful discharge in the next 1-2 days.  He has continued to refuse AVF.   2. Mild hypokalemia- repleting today 3. +Fhx of ESRD and Dialysis (father, brother) 13. Chronic sCHF, binventricular; decompensated; LVEF 15-20%.   5. Morbid Obesity 6. Venous stasis ulcer on R toe- s/p antibiotics 7. DM2 uncontrolled A1c 11.5% 8. Dispo: hopefully in the next 1-2 days.    Subjective:    Robust diuresis with continued improvement in cr.  No complaints this AM   Objective:   BP 117/77 (BP Location: Left Arm)   Pulse 85   Temp 98.6 F (37 C) (Oral)   Resp 20   Ht 5\' 11"  (1.803 m)   Wt (!) 136.2 kg (300 lb 4.8 oz)   SpO2 98%   BMI 41.88 kg/m   Intake/Output Summary (Last 24 hours) at 12/05/2017 1301 Last data filed at 12/05/2017 1025 Gross per 24 hour  Intake 840.1 ml  Output 3775 ml  Net -2934.9 ml   Weight change: -3.538 kg (-7 lb 12.8 oz)  Physical Exam: GEN: NAD, Morbidly obese CV: RRR w/o mgr nl s1s2 PULM: CTAB ABD: no abd wall edema EXT: edema better, unna boots off, still has 2+ edema to thigh    Imaging: No results found.  Labs: BMET Recent Labs  Lab 12/01/17 0522 12/02/17 0405 12/02/17 1556 12/03/17 0831 12/03/17 1547 12/04/17 0443 12/05/17 0449  NA 139 139 137 138 138 138 140  K 3.6 3.4* 3.6 3.4* 3.6 3.3* 4.3  CL 104 102 102 101 101 99* 100*  CO2 24 27 26 26 26 28 28   GLUCOSE 98 105* 105* 127* 131* 129* 153*  BUN 82* 79* 75* 71* 70* 67* 68*  CREATININE 4.78* 4.25* 3.92* 3.50* 3.47* 3.35* 3.58*  CALCIUM 8.0* 8.2* 8.2* 8.4* 8.5* 8.6* 8.3*  PHOS  --   --   --  4.0  --  3.9 4.2   CBC Recent Labs  Lab 12/01/17 0522 12/02/17 0405 12/05/17 0449  WBC 8.2 7.7 12.7*  HGB 11.4* 11.4*  12.0*  HCT 35.6* 35.3* 37.5*  MCV 87.7 87.2 88.0  PLT 269 330 429*    Medications:    . aspirin EC  81 mg Oral Daily  . carvedilol  12.5 mg Oral BID WC  . ferrous sulfate  325 mg Oral BID WC  . heparin  5,000 Units Subcutaneous Q8H  . insulin aspart  0-20 Units Subcutaneous TID WC  . insulin aspart  0-5 Units Subcutaneous QHS  . insulin glargine  5 Units Subcutaneous QHS  . metolazone  5 mg Oral Daily  . silver sulfADIAZINE   Topical Daily  . torsemide  60 mg Oral BID      [name], MD 12/05/2017, 1:01 PM

## 2017-12-06 DIAGNOSIS — I5023 Acute on chronic systolic (congestive) heart failure: Secondary | ICD-10-CM

## 2017-12-06 DIAGNOSIS — I5021 Acute systolic (congestive) heart failure: Secondary | ICD-10-CM

## 2017-12-06 DIAGNOSIS — N179 Acute kidney failure, unspecified: Secondary | ICD-10-CM

## 2017-12-06 LAB — RENAL FUNCTION PANEL
ANION GAP: 10 (ref 5–15)
Albumin: 1.9 g/dL — ABNORMAL LOW (ref 3.5–5.0)
BUN: 63 mg/dL — ABNORMAL HIGH (ref 6–20)
CALCIUM: 8 mg/dL — AB (ref 8.9–10.3)
CHLORIDE: 96 mmol/L — AB (ref 101–111)
CO2: 33 mmol/L — AB (ref 22–32)
CREATININE: 3.23 mg/dL — AB (ref 0.61–1.24)
GFR calc Af Amer: 26 mL/min — ABNORMAL LOW (ref >60)
GFR, EST NON AFRICAN AMERICAN: 22 mL/min — AB (ref >60)
Glucose, Bld: 126 mg/dL — ABNORMAL HIGH (ref 65–99)
Phosphorus: 4 mg/dL (ref 2.5–4.6)
Potassium: 3.1 mmol/L — ABNORMAL LOW (ref 3.5–5.1)
SODIUM: 139 mmol/L (ref 135–145)

## 2017-12-06 LAB — GLUCOSE, CAPILLARY
Glucose-Capillary: 121 mg/dL — ABNORMAL HIGH (ref 65–99)
Glucose-Capillary: 137 mg/dL — ABNORMAL HIGH (ref 65–99)
Glucose-Capillary: 162 mg/dL — ABNORMAL HIGH (ref 65–99)
Glucose-Capillary: 119 mg/dL — ABNORMAL HIGH (ref 65–99)

## 2017-12-06 LAB — MAGNESIUM: MAGNESIUM: 1.7 mg/dL (ref 1.7–2.4)

## 2017-12-06 MED ORDER — POTASSIUM CHLORIDE 10 MEQ/100ML IV SOLN
10.0000 meq | INTRAVENOUS | Status: AC
  Administered 2017-12-06: 10 meq via INTRAVENOUS
  Filled 2017-12-06 (×2): qty 100

## 2017-12-06 MED ORDER — METOPROLOL TARTRATE 5 MG/5ML IV SOLN
5.0000 mg | Freq: Once | INTRAVENOUS | Status: AC
  Administered 2017-12-06: 5 mg via INTRAVENOUS
  Filled 2017-12-06: qty 5

## 2017-12-06 MED ORDER — CARVEDILOL 25 MG PO TABS
25.0000 mg | ORAL_TABLET | Freq: Two times a day (BID) | ORAL | Status: DC
  Administered 2017-12-06 – 2017-12-07 (×3): 25 mg via ORAL
  Filled 2017-12-06 (×3): qty 1

## 2017-12-06 MED ORDER — POTASSIUM CHLORIDE CRYS ER 20 MEQ PO TBCR
40.0000 meq | EXTENDED_RELEASE_TABLET | Freq: Once | ORAL | Status: AC
  Administered 2017-12-06: 40 meq via ORAL
  Filled 2017-12-06: qty 2

## 2017-12-06 MED ORDER — POTASSIUM CHLORIDE CRYS ER 20 MEQ PO TBCR
40.0000 meq | EXTENDED_RELEASE_TABLET | Freq: Every day | ORAL | Status: DC
  Administered 2017-12-06: 40 meq via ORAL
  Filled 2017-12-06 (×2): qty 2

## 2017-12-06 MED ORDER — MAGNESIUM SULFATE 2 GM/50ML IV SOLN
2.0000 g | Freq: Once | INTRAVENOUS | Status: AC
  Administered 2017-12-06: 2 g via INTRAVENOUS
  Filled 2017-12-06: qty 50

## 2017-12-06 MED ORDER — ISOSORB DINITRATE-HYDRALAZINE 20-37.5 MG PO TABS: 1.0000 | ORAL_TABLET | Freq: Three times a day (TID) | ORAL | 3 refills | Status: DC

## 2017-12-06 MED ORDER — POTASSIUM CHLORIDE CRYS ER 20 MEQ PO TBCR: 40.0000 meq | EXTENDED_RELEASE_TABLET | Freq: Every day | ORAL | 3 refills | Status: DC

## 2017-12-06 MED ORDER — CARVEDILOL 12.5 MG PO TABS: 12.5000 mg | ORAL_TABLET | Freq: Two times a day (BID) | ORAL | 3 refills | Status: DC

## 2017-12-06 MED ORDER — INSULIN GLARGINE 100 UNIT/ML ~~LOC~~ SOLN: 5.0000 [IU] | Freq: Every day | SUBCUTANEOUS | 11 refills | Status: DC

## 2017-12-06 MED ORDER — SODIUM CHLORIDE 0.9 % IV SOLN
INTRAVENOUS | Status: DC
  Administered 2017-12-06: 18:00:00 via INTRAVENOUS

## 2017-12-06 MED ORDER — TORSEMIDE 20 MG PO TABS: 60.0000 mg | ORAL_TABLET | Freq: Two times a day (BID) | ORAL | 3 refills | Status: DC

## 2017-12-06 NOTE — Progress Notes (Signed)
PROGRESS NOTE    Joshua Massey  HWE:[id number] DOB: [birth year] DOA: 11/27/2017 PCP: [name], [name], MD    Brief Narrative:  Patient is a 42 year old male with history of CHF, DM type II, CKD stage 3, and obesity who presented to the ED for fluid overload and was admitted for acute exacerbation of CHF. Patient had 60 lb weight gain in 4-5 weeks prior to admission. He was started on high dose of diuretics to which he responded and is now down to 292 lb with a baseline of 290. An echocardiogram showed an EF of 15-20%, and creatinine is steadily decreasing from 5.0 at admission to 3.23 today. Today patient was noted to have a wide QRS tachycardia on the monitor so an EKG was ordered.    Assessment & Plan:   Principal Problem:   Volume overload Active Problems:   Hypertensive heart disease with heart failure (HCC)   Hypertension   Non-ischemic cardiomyopathy, EF < 20%   Obesity   Acute on chronic systolic CHF (congestive heart failure) (HCC)   Coronary artery disease, 50% LAD at cath 02/16/12   Type 2 diabetes mellitus with vascular disease ([place])   Acute renal failure (HCC)   CKD (chronic kidney disease), stage III (HCC)   Hypovitaminosis D   Diabetic foot ulcer (HCC)   Cellulitis in diabetic foot ([place])   Acute heart failure (HCC)   Hepatopathy   Chronic venous hypertension (idiopathic) with ulcer and inflammation of bilateral lower extremity (HCC)  Volume overload secondary to acute exacerbation of CHF -Patient improving. Baseline weight 290, was 350 on admission. Now 292. -Echocardiogram show EF down from 20-25% in 2018, now 15-20% -Increase Carvedilol to 25mg  BID, continue torsemide 60mg  BID -Continue to monitor daily weights and signs of fluid overload, follow up with heart failure clinic outpatient  AKI on CKD stage 5, progressed from stage 3 -Baseline creatinine 2.6 in 2018, believed to be near his new baseline as a result of CKD progression -Creatinine was 5.0 on  admission, now down to 3.23. Continue to monitor with BMP tomorrow -Continue torsemide 60mg  BID, D/C metazolone -Nephrology following, discussed AVF for future dialysis but patient declined  Diabetes Mellitus Type II with diabetic foot ulcer -HgbA1C 11.5% -Continue monitoring CBG and insulin sliding scale -Follow up with Dr. Sharol Given outpatient for ulcer   Abnormal EKG -Wide QRS tachycardia, was given potassium and IV mag to which he did not respond -Carvedilol increased to 25mg  BID and patient given 1 dose IV Metoprolol 5mg , observe overnight -Cardiology consult considered if still in abnormal rhythm tomorrow     DVT prophylaxis:Heparin  Code Status: Full code Family Communication: None Disposition Plan: Home when improved   Consultants:   Nephrology  Orthopedics  Procedures:  Echocardiogram: Severe biventricular dilatation and systolic dysfunction. No thrombus in the left ventricular apex.  Antimicrobials:   None   Subjective: Patient reports feeling much better than he did on admission. He does not feel fluid overloaded. He reports no chest pain, difficulty breathing, shortness of breath, orthopnea.  Objective: Vitals:   12/06/17 1206 12/06/17 1459 12/06/17 1507 12/06/17 1531  BP:  (!) 82/67 130/89   Pulse: (!) 140 (!) 121 (!) 124 (!) 121  Resp:  20    Temp:  98.4 F (36.9 C)    TempSrc:  Oral    SpO2: 94% 95%    Weight:      Height:        Intake/Output Summary (Last 24 hours) at  12/06/2017 1536 Last data filed at 12/06/2017 1139 Gross per 24 hour  Intake 888 ml  Output 3450 ml  Net -2562 ml   Filed Weights   12/04/17 0402 12/05/17 0337 12/06/17 0513  Weight: (!) 139.8 kg (308 lb 1.6 oz) (!) 136.2 kg (300 lb 4.8 oz) 132.8 kg (292 lb 11.2 oz)    Examination:   General: Not in pain or dyspnea Neurology: Awake and alert, non focal  E ENT: no pallor, no icterus, oral mucosa moist Cardiovascular: + JVD. S1-S2 present, rhythmic, no gallops, rubs, or  murmurs. Minimal lower extremity edema. Pulmonary: vesicular breath sounds bilaterally, adequate air movement, no wheezing, rhonchi or rales. Gastrointestinal. Abdomen protuberant, no organomegaly, non tender, no rebound or guarding Skin. No rashes Musculoskeletal: no joint deformities     Data Reviewed: I have personally reviewed following labs and imaging studies  CBC: Recent Labs  Lab 12/01/17 0522 12/02/17 0405 12/05/17 0449  WBC 8.2 7.7 12.7*  HGB 11.4* 11.4* 12.0*  HCT 35.6* 35.3* 37.5*  MCV 87.7 87.2 88.0  PLT 269 330 315*   Basic Metabolic Panel: Recent Labs  Lab 12/02/17 1556 12/03/17 0831 12/03/17 1547 12/04/17 0443 12/05/17 0449 12/06/17 0504  NA 137 138 138 138 140 139  K 3.6 3.4* 3.6 3.3* 4.3 3.1*  CL 102 101 101 99* 100* 96*  CO2 26 26 26 28 28  33*  GLUCOSE 105* 127* 131* 129* 153* 126*  BUN 75* 71* 70* 67* 68* 63*  CREATININE 3.92* 3.50* 3.47* 3.35* 3.58* 3.23*  CALCIUM 8.2* 8.4* 8.5* 8.6* 8.3* 8.0*  MG 2.0 2.0  --  2.0 1.8 1.7  PHOS  --  4.0  --  3.9 4.2 4.0   GFR: Estimated Creatinine Clearance: 42.3 mL/min (A) (by C-G formula based on SCr of 3.23 mg/dL (H)). Liver Function Tests: Recent Labs  Lab 12/03/17 0831 12/04/17 0443 12/05/17 0449 12/06/17 0504  ALBUMIN 1.8* 1.9* 1.9* 1.9*   No results for input(s): LIPASE, AMYLASE in the last 168 hours. No results for input(s): AMMONIA in the last 168 hours. Coagulation Profile: No results for input(s): INR, PROTIME in the last 168 hours. Cardiac Enzymes: No results for input(s): CKTOTAL, CKMB, CKMBINDEX, TROPONINI in the last 168 hours. BNP (last 3 results) No results for input(s): PROBNP in the last 8760 hours. HbA1C: No results for input(s): HGBA1C in the last 72 hours. CBG: Recent Labs  Lab 12/05/17 1155 12/05/17 1626 12/05/17 2103 12/06/17 0721 12/06/17 1136  GLUCAP 125* 151* 114* 121* 137*   Lipid Profile: No results for input(s): CHOL, HDL, LDLCALC, TRIG, CHOLHDL, LDLDIRECT in  the last 72 hours. Thyroid Function Tests: No results for input(s): TSH, T4TOTAL, FREET4, T3FREE, THYROIDAB in the last 72 hours. Anemia Panel: No results for input(s): VITAMINB12, FOLATE, FERRITIN, TIBC, IRON, RETICCTPCT in the last 72 hours.    Radiology Studies: I have reviewed all of the imaging during this hospital visit personally     Scheduled Meds: . aspirin EC  81 mg Oral Daily  . carvedilol  25 mg Oral BID WC  . ferrous sulfate  325 mg Oral BID WC  . heparin  5,000 Units Subcutaneous Q8H  . insulin aspart  0-20 Units Subcutaneous TID WC  . insulin aspart  0-5 Units Subcutaneous QHS  . insulin glargine  5 Units Subcutaneous QHS  . potassium chloride  40 mEq Oral Daily  . potassium chloride  40 mEq Oral Once  . silver sulfADIAZINE   Topical Daily  . torsemide  60 mg Oral BID   Continuous Infusions:   LOS: 9 days    Carita Pian, PA-S   [facility] Pager [phone]

## 2017-12-06 NOTE — Progress Notes (Signed)
Handed MD EKG, MD aware  MD stated to hold off on discharge  MD spoke to pt

## 2017-12-06 NOTE — Progress Notes (Signed)
PROGRESS NOTE    Joshua Massey  JEH:[id number] DOB: [birth year] DOA: 11/27/2017 PCP: [name], [name], MD      Brief Narrative:  Joshua Massey is a 42 y.o. M with chronic systolicCHF EF 37%, DM2, HTN,CKD stage 3,GERD whohas not been seen by a physician in almost a year, whopresented to the ED with c/o "extra fluid" and weight gain. Over the past 4-5 weeks pt has gained approx60lbs, up to 350 lbs from his baseline of 290 lb. He has had progressiveBLEedema R>L, scrotal swelling and creatinine was 5.5 on admission, from baseline of 2.5. -Renal consulted, started high dose diuretics, had minimal response for first 48hours and there was initial concern he might need HD, subsequently started aggressively diuresing and now he is 20L negative, weight down 50lbs and creatinine down to 3.5     Assessment & Plan:  Chronic systolic CHF imProving -Continue oral torsemide - Hold metolazone - Daily renal function  Acute kidney injury on chronic kidney disease stage III Stable -Adjust diuretics as above  Supraventricular tachycardia versus accelerated junctional rhythm This is a new problem.  ECG was personally reviewed, appears to be either an SVT) he has a long QRS to begin with), or accelerated junctional rhythm.  It is not responded to metoprolol this afternoon. -IV potassium, IV magnesium -Repeat ECG tomorrow morning  Hypokalemia -Supplement K  Diabetes -Continue Lantus -Continue sliding scale  Morbid obesity  Lower extremity cellulitis Right great toe diabetic foot ulcer He has completed clindamycin to Cephalxin. -W OC consult appreciated -Follow up with Dr. Sharol Given  Hypertension -Continue carvedilol       DVT prophylaxis: Heparin Code Status: FULl Family Communication: None present MDM and disposition Plan: The below labs and imaging reports were reviewed and summarized above.    The patient was admitted with severe CHF and worseening renal function.  Kidney  function has improved with diruestis.  He was to be discharged today, but had worseneing of a new SVT.  Will replete electrolytes.  If abnmormal still tomorrow, will consult Cardiology   Consultants:   Nephrology  Procedures:   Echo  Antimicrobials:   Clindamycin  Cephalaexin    Subjective: Feeling no more orthopnea.  Leg swelling resolved.  No dyspnea with exertion.  No chest pain, palpitations, DOe.  Objective: Vitals:   12/06/17 1206 12/06/17 1459 12/06/17 1507 12/06/17 1531  BP:  (!) 82/67 130/89   Pulse: (!) 140 (!) 121 (!) 124 (!) 121  Resp:  20    Temp:  98.4 F (36.9 C)    TempSrc:  Oral    SpO2: 94% 95%    Weight:      Height:        Intake/Output Summary (Last 24 hours) at 12/06/2017 1802 Last data filed at 12/06/2017 1700 Gross per 24 hour  Intake 770 ml  Output 4350 ml  Net -3580 ml   Filed Weights   12/04/17 0402 12/05/17 0337 12/06/17 0513  Weight: (!) 139.8 kg (308 lb 1.6 oz) (!) 136.2 kg (300 lb 4.8 oz) 132.8 kg (292 lb 11.2 oz)    Examination: General appearance: obese adult male, alert and in no acute distress.   HEENT: Anicteric, conjunctiva pink, lids and lashes normal. No nasal deformity, discharge, epistaxis.  Lips moist.   Skin: Warm and dry.  no jaundice.  No suspicious rashes or lesions. Cardiac: RRR, nl S1-S2, no murmurs appreciated.  Capillary refill is brisk.  JVP slightly above collar bone.  No LE edema.  Radia  pulses 2+ and symmetric. Respiratory: Normal respiratory rate and rhythm.  CTAB without rales or wheezes. Abdomen: Abdomen soft.  no TTP. No ascites, distension, hepatosplenomegaly.   MSK: No deformities or effusions. Neuro: Awake and alert.  EOMI, moves all extremities. Speech fluent.    Psych: Sensorium intact and responding to questions, attention normal. Affect normal.  Judgment and insight appear normal.    Data Reviewed: I have personally reviewed following labs and imaging studies:  CBC: Recent Labs  Lab  2017-12-08 0522 12/02/17 0405 12/05/17 0449  WBC 8.2 7.7 12.7*  HGB 11.4* 11.4* 12.0*  HCT 35.6* 35.3* 37.5*  MCV 87.7 87.2 88.0  PLT 269 330 884*   Basic Metabolic Panel: Recent Labs  Lab 12/02/17 1556 12/03/17 0831 12/03/17 1547 12/04/17 0443 12/05/17 0449 12/06/17 0504  NA 137 138 138 138 140 139  K 3.6 3.4* 3.6 3.3* 4.3 3.1*  CL 102 101 101 99* 100* 96*  CO2 26 26 26 28 28  33*  GLUCOSE 105* 127* 131* 129* 153* 126*  BUN 75* 71* 70* 67* 68* 63*  CREATININE 3.92* 3.50* 3.47* 3.35* 3.58* 3.23*  CALCIUM 8.2* 8.4* 8.5* 8.6* 8.3* 8.0*  MG 2.0 2.0  --  2.0 1.8 1.7  PHOS  --  4.0  --  3.9 4.2 4.0   GFR: Estimated Creatinine Clearance: 42.3 mL/min (A) (by C-G formula based on SCr of 3.23 mg/dL (H)). Liver Function Tests: Recent Labs  Lab 12/03/17 0831 12/04/17 0443 12/05/17 0449 12/06/17 0504  ALBUMIN 1.8* 1.9* 1.9* 1.9*   No results for input(s): LIPASE, AMYLASE in the last 168 hours. No results for input(s): AMMONIA in the last 168 hours. Coagulation Profile: No results for input(s): INR, PROTIME in the last 168 hours. Cardiac Enzymes: No results for input(s): CKTOTAL, CKMB, CKMBINDEX, TROPONINI in the last 168 hours. BNP (last 3 results) No results for input(s): PROBNP in the last 8760 hours. HbA1C: No results for input(s): HGBA1C in the last 72 hours. CBG: Recent Labs  Lab 12/05/17 1626 12/05/17 2103 12/06/17 0721 12/06/17 1136 12/06/17 1641  GLUCAP 151* 114* 121* 137* 162*   Lipid Profile: No results for input(s): CHOL, HDL, LDLCALC, TRIG, CHOLHDL, LDLDIRECT in the last 72 hours. Thyroid Function Tests: No results for input(s): TSH, T4TOTAL, FREET4, T3FREE, THYROIDAB in the last 72 hours. Anemia Panel: No results for input(s): VITAMINB12, FOLATE, FERRITIN, TIBC, IRON, RETICCTPCT in the last 72 hours. Urine analysis:    Component Value Date/Time   COLORURINE AMBER (A) 08/17/2016 0129   APPEARANCEUR HAZY (A) 08/17/2016 0129   LABSPEC 1.016  08/17/2016 0129   PHURINE 5.0 08/17/2016 0129   GLUCOSEU 50 (A) 08/17/2016 0129   HGBUR SMALL (A) 08/17/2016 0129   BILIRUBINUR NEGATIVE 08/17/2016 0129   KETONESUR NEGATIVE 08/17/2016 0129   PROTEINUR >=300 (A) 08/17/2016 0129   UROBILINOGEN 0.2 09/02/2009 0935   NITRITE NEGATIVE 08/17/2016 0129   LEUKOCYTESUR NEGATIVE 08/17/2016 0129   Sepsis Labs: @LABRCNTIP (procalcitonin:4,lacticacidven:4)  ) Recent Results (from the past 240 hour(s))  Culture, blood (routine x 2)     Status: None   Collection Time: 11/28/17 12:46 AM  Result Value Ref Range Status   Specimen Description BLOOD RIGHT ANTECUBITAL  Final   Special Requests   Final    BOTTLES DRAWN AEROBIC AND ANAEROBIC Blood Culture adequate volume   Culture   Final    NO GROWTH 5 DAYS Performed at [facility], [place] [address], [place], [place] [address]    Report Status 12/03/2017 FINAL  Final  Culture, blood (routine x 2)     Status: None   Collection Time: 11/28/17 12:46 AM  Result Value Ref Range Status   Specimen Description BLOOD RIGHT ANTECUBITAL  Final   Special Requests   Final    BOTTLES DRAWN AEROBIC AND ANAEROBIC Blood Culture adequate volume   Culture   Final    NO GROWTH 5 DAYS Performed at [facility], [address], [place], [place] [address]    Report Status 12/03/2017 FINAL  Final         Radiology Studies: No results found.      Scheduled Meds: . aspirin EC  81 mg Oral Daily  . carvedilol  25 mg Oral BID WC  . ferrous sulfate  325 mg Oral BID WC  . heparin  5,000 Units Subcutaneous Q8H  . insulin aspart  0-20 Units Subcutaneous TID WC  . insulin aspart  0-5 Units Subcutaneous QHS  . insulin glargine  5 Units Subcutaneous QHS  . potassium chloride  40 mEq Oral Daily  . potassium chloride  40 mEq Oral Once  . silver sulfADIAZINE   Topical Daily  . torsemide  60 mg Oral BID   Continuous Infusions: . potassium chloride       LOS: 9 days    Time spent: 25  minutes    Edwin Dada, MD [facility] 12/06/2017, 6:02 PM     Pager [phone] --- please page though AMION:  [email or web address] If 7PM-7AM, please contact night-coverage

## 2017-12-06 NOTE — Progress Notes (Signed)
Pt refused the last dose of IV potassium, c/o of burning on the IV site, Dr. Loleta Books ordered KCL 32mEq PO one time.

## 2017-12-06 NOTE — Progress Notes (Signed)
MD called stated do not discharge pt until EKG is completed and blood work  Informed NT and primary RN

## 2017-12-06 NOTE — Progress Notes (Signed)
Physical Therapy Treatment Patient Details Name: Joshua Massey MRN: [id number] DOB: [birth year] Today's Date: 12/06/2017    History of Present Illness 42 y.o. male with h/o chronic systolic CHF, DM2, HTN, CKD stage 3, GERD who has not been seen by a physician in almost a year, who presented to the ED with c/o "extra fluid" and weight gain    PT Comments    Session focused on ambulation and stair training to improve technique for safety. Pt has reportedly lost 50 lbs of fluid and demonstrates significant improvement in mobility compared to previous sessions. HR checked after activity and found to be 140 bpm with pt asymptomatic. RN notified. Pt unlikely to be D/C today based on recent EKG result. PT will continue to follow to improve level of mobility and level of independence.  Follow Up Recommendations  Home health PT;Supervision for mobility/OOB     Equipment Recommendations  Other (comment)(Bariatric RW)    Recommendations for Other Services       Precautions / Restrictions Precautions Precautions: Fall Restrictions Weight Bearing Restrictions: No    Mobility  Bed Mobility Overal bed mobility: Modified Independent             General bed mobility comments: Pt sitting EOB upon arrival with single LE on bed. Pt able to scoot to EOB Mod I in preparation for transfer (mildly increased time)  Transfers Overall transfer level: Needs assistance Equipment used: Rolling walker (2 wheeled) Transfers: Sit to/from Stand Sit to Stand: Min guard         General transfer comment: Min guard for sit<>stand x 2 from low bed surface. On first atempt, pt pulled up on RW with decreased stability resulting in R lean and increased effort to stand. On 2nd sit<>stand, pt practiced pushing up from bed with improved stability and no LOB  Ambulation/Gait Ambulation/Gait assistance: Supervision Gait Distance (Feet): 250 Feet Assistive device: Rolling walker (2 wheeled);None Gait  Pattern/deviations: Step-through pattern;Wide base of support Gait velocity: decreasd   General Gait Details: Pt ambulates with wide BOS and increased RLE external rotation that pt reports is due to a previous hip surgery. Ambulation with and without RW trialed with further RLE external rotation noted without RW as well as decreased stability and increased postural sway. After mobility, HR checked and found to be 140 bpm. RN notified and pt asymptomatic   Stairs Stairs: Yes Stairs assistance: Min guard Stair Management: Sideways;One rail Right Number of Stairs: 2 General stair comments: Pt ascended/descended two trials of 2 stairs to improve technique from first attempt. On first attempt, pt did not allow for adequte space for BLE on single step resulting in pt crossing one foot over the other. Pt also cued to turn fully sideways to face railing. On 2nd attempt pt able to perform stairs properly with cueing and increased stability noted.   Wheelchair Mobility    Modified Rankin (Stroke Patients Only)       Balance Overall balance assessment: Needs assistance Sitting-balance support: No upper extremity supported;Feet unsupported Sitting balance-Leahy Scale: Good Sitting balance - Comments: able to sit EOb without support   Standing balance support: During functional activity Standing balance-Leahy Scale: Fair Standing balance comment: able to statically stand w/o support. Pt demonstrates decreased stability with mild challenge without RW during gait                            Cognition Arousal/Alertness: Awake/alert Behavior During Therapy: [facility] for tasks  assessed/performed Overall Cognitive Status: Within Functional Limits for tasks assessed                                        Exercises      General Comments        Pertinent Vitals/Pain Pain Assessment: No/denies pain    Home Living                      Prior Function             PT Goals (current goals can now be found in the care plan section) Progress towards PT goals: Progressing toward goals    Frequency    Min 3X/week      PT Plan Current plan remains appropriate    Co-evaluation              AM-PAC PT "6 Clicks" Daily Activity  Outcome Measure  Difficulty turning over in bed (including adjusting bedclothes, sheets and blankets)?: None Difficulty moving from lying on back to sitting on the side of the bed? : None Difficulty sitting down on and standing up from a chair with arms (e.g., wheelchair, bedside commode, etc,.)?: A Little Help needed moving to and from a bed to chair (including a wheelchair)?: A Little Help needed walking in hospital room?: A Little Help needed climbing 3-5 steps with a railing? : A Little 6 Click Score: 20    End of Session Equipment Utilized During Treatment: Gait belt Activity Tolerance: Patient tolerated treatment well Patient left: in bed;with call bell/phone within reach(sitting EOB) Nurse Communication: Mobility status;Other (comment)(HR: 140 bpm) PT Visit Diagnosis: Muscle weakness (generalized) (M62.81);Unsteadiness on feet (R26.81);Difficulty in walking, not elsewhere classified (R26.2)     Time: 5400-8676 PT Time Calculation (min) (ACUTE ONLY): 17 min  Charges:  $Gait Training: 8-22 mins                    G Codes:       Gabe Woods Gangemi, SPT   [employer] 12/06/2017, 2:57 PM

## 2017-12-06 NOTE — Progress Notes (Signed)
   12/06/17 2300  Vitals  ECG Heart Rate (!) 127  Provider Notification  Provider Name/Title Lamar Blinks NP  Time Notified 2344  Notification Type Page  Notification Reason Other (Comment) (HR sustained in the 120's-130's)  Response No new orders;Other (Comment) (awaiting call back)

## 2017-12-06 NOTE — Progress Notes (Signed)
  [facility] Progress Note    Assessment/ Plan:   1. CKD5: baseline creatinine 2.6 12/2016.  He had massive hypervolemia now responding to high dose diuretics. Continue torsemide 60 PO BID, d/c metolazone.  He has continued to refuse AVF--> of note, his albumin is 1.9 which ideally would be improved before surgical intervention anyway.  Had AKI which peaked at around 5, now is down to 3.2.  He has followup with me 12/18/2017 with check-in at 9:15.    2. Mild hypokalemia- repleting   3. +Fhx of ESRD and Dialysis (father, brother)  10. Chronic sCHF, binventricular; decompensated; LVEF 15-20%.    5. Morbid Obesity  6. Venous stasis ulcer on R toe- s/p antibiotics  7. DM2 uncontrolled A1c 11.5%  8. Dispo: from renal perspective OK for him to go.    Subjective:    Feeling better.  Hopeful for d/c today.  On PO diuretics.     Objective:   BP 127/76 (BP Location: Left Arm)   Pulse 81   Temp 97.9 F (36.6 C) (Oral)   Resp 18   Ht 5\' 11"  (1.803 m)   Wt 132.8 kg (292 lb 11.2 oz)   SpO2 96%   BMI 40.82 kg/m   Intake/Output Summary (Last 24 hours) at 12/06/2017 1110 Last data filed at 12/06/2017 0916 Gross per 24 hour  Intake 1196 ml  Output 3650 ml  Net -2454 ml   Weight change: -3.447 kg (-7 lb 9.6 oz)  Physical Exam: GEN: NAD, Morbidly obese CV: RRR w/o mgr nl s1s2 PULM: CTAB ABD: no abd wall edema EXT: edema better, unna boots off, 1+ edema to thigh, R toe dressed.      Imaging: No results found.  Labs: BMET Recent Labs  Lab 12/02/17 0405 12/02/17 1556 12/03/17 0831 12/03/17 1547 12/04/17 0443 12/05/17 0449 12/06/17 0504  NA 139 137 138 138 138 140 139  K 3.4* 3.6 3.4* 3.6 3.3* 4.3 3.1*  CL 102 102 101 101 99* 100* 96*  CO2 27 26 26 26 28 28  33*  GLUCOSE 105* 105* 127* 131* 129* 153* 126*  BUN 79* 75* 71* 70* 67* 68* 63*  CREATININE 4.25* 3.92* 3.50* 3.47* 3.35* 3.58* 3.23*  CALCIUM 8.2* 8.2* 8.4* 8.5* 8.6* 8.3* 8.0*  PHOS  --   --  4.0   --  3.9 4.2 4.0   CBC Recent Labs  Lab 12/01/17 0522 12/02/17 0405 12/05/17 0449  WBC 8.2 7.7 12.7*  HGB 11.4* 11.4* 12.0*  HCT 35.6* 35.3* 37.5*  MCV 87.7 87.2 88.0  PLT 269 330 429*    Medications:    . aspirin EC  81 mg Oral Daily  . carvedilol  12.5 mg Oral BID WC  . ferrous sulfate  325 mg Oral BID WC  . heparin  5,000 Units Subcutaneous Q8H  . insulin aspart  0-20 Units Subcutaneous TID WC  . insulin aspart  0-5 Units Subcutaneous QHS  . insulin glargine  5 Units Subcutaneous QHS  . potassium chloride  40 mEq Oral Daily  . silver sulfADIAZINE   Topical Daily  . torsemide  60 mg Oral BID      [name], MD 12/06/2017, 11:10 AM

## 2017-12-07 DIAGNOSIS — I428 Other cardiomyopathies: Secondary | ICD-10-CM

## 2017-12-07 DIAGNOSIS — E1159 Type 2 diabetes mellitus with other circulatory complications: Secondary | ICD-10-CM

## 2017-12-07 DIAGNOSIS — I4892 Unspecified atrial flutter: Secondary | ICD-10-CM

## 2017-12-07 DIAGNOSIS — I110 Hypertensive heart disease with heart failure: Secondary | ICD-10-CM

## 2017-12-07 LAB — GLUCOSE, CAPILLARY
Glucose-Capillary: 147 mg/dL — ABNORMAL HIGH (ref 65–99)
Glucose-Capillary: 152 mg/dL — ABNORMAL HIGH (ref 65–99)
Glucose-Capillary: 129 mg/dL — ABNORMAL HIGH (ref 65–99)

## 2017-12-07 LAB — RENAL FUNCTION PANEL
ALBUMIN: 1.9 g/dL — AB (ref 3.5–5.0)
Anion gap: 14 (ref 5–15)
BUN: 62 mg/dL — ABNORMAL HIGH (ref 6–20)
CHLORIDE: 95 mmol/L — AB (ref 101–111)
CO2: 31 mmol/L (ref 22–32)
CREATININE: 3.35 mg/dL — AB (ref 0.61–1.24)
Calcium: 8 mg/dL — ABNORMAL LOW (ref 8.9–10.3)
GFR calc Af Amer: 25 mL/min — ABNORMAL LOW (ref >60)
GFR, EST NON AFRICAN AMERICAN: 21 mL/min — AB (ref >60)
Glucose, Bld: 147 mg/dL — ABNORMAL HIGH (ref 65–99)
Phosphorus: 3.7 mg/dL (ref 2.5–4.6)
Potassium: 4.5 mmol/L (ref 3.5–5.1)
Sodium: 140 mmol/L (ref 135–145)

## 2017-12-07 LAB — MAGNESIUM: MAGNESIUM: 1.9 mg/dL (ref 1.7–2.4)

## 2017-12-07 MED ORDER — APIXABAN 5 MG PO TABS: 5.0000 mg | ORAL_TABLET | Freq: Two times a day (BID) | ORAL | Status: DC

## 2017-12-07 MED ORDER — APIXABAN 5 MG PO TABS: 5.0000 mg | ORAL_TABLET | Freq: Two times a day (BID) | ORAL | 3 refills | Status: DC

## 2017-12-07 MED ORDER — CARVEDILOL 25 MG PO TABS: 25.0000 mg | ORAL_TABLET | Freq: Two times a day (BID) | ORAL | 3 refills | Status: DC

## 2017-12-07 MED ORDER — AMIODARONE HCL 200 MG PO TABS
400.0000 mg | ORAL_TABLET | Freq: Two times a day (BID) | ORAL | Status: DC
  Administered 2017-12-07: 400 mg via ORAL
  Filled 2017-12-07: qty 2

## 2017-12-07 MED ORDER — TORSEMIDE 20 MG PO TABS
60.0000 mg | ORAL_TABLET | Freq: Every day | ORAL | Status: DC
  Administered 2017-12-07: 60 mg via ORAL
  Filled 2017-12-07: qty 3

## 2017-12-07 MED ORDER — TORSEMIDE 20 MG PO TABS: 60.0000 mg | ORAL_TABLET | Freq: Every day | ORAL | 3 refills | Status: DC

## 2017-12-07 MED ORDER — AMIODARONE HCL 200 MG PO TABS: ORAL_TABLET | ORAL | 0 refills | Status: DC

## 2017-12-07 NOTE — Progress Notes (Addendum)
PROGRESS NOTE    Joshua Massey  GYI:[id number] DOB: 1976-03-06 DOA: 11/27/2017 PCP: [name], [name], MD    Brief Narrative:  Patient is a 42 year old male with history of CHF, DM type II, CKD stage 3, and obesity who presented to the ED for fluid overload and was admitted for acute exacerbation of CHF. Patient had 60 lb weight gain in 4-5 weeks prior to admission. He was started on high dose of diuretics to which he responded and is now down to his baseline dry weight. An echocardiogram showed an EF of 15-20%, and creatinine is steadily decreasing from 5.0 at admission to 3.35 which is thought to be around his new baseline. Yesterday patient had wide QRS tachycardia and continues to be in this rhythm today. Patient did not respond to IV mag, potassium, and Metoprolol so cardiology was consulted.    Assessment & Plan:   Principal Problem:   Volume overload Active Problems:   Hypertensive heart disease with heart failure (HCC)   Hypertension   Non-ischemic cardiomyopathy, EF < 20%   Obesity   Acute on chronic systolic CHF (congestive heart failure) (HCC)   Coronary artery disease, 50% LAD at cath 02/16/12   Type 2 diabetes mellitus with vascular disease ([place])   Acute renal failure (HCC)   CKD (chronic kidney disease), stage III (HCC)   Hypovitaminosis D   Diabetic foot ulcer (HCC)   Cellulitis in diabetic foot ([place])   Acute heart failure (HCC)   Hepatopathy   Chronic venous hypertension (idiopathic) with ulcer and inflammation of bilateral lower extremity (HCC)  Volume overload secondary to acute exacerbation of CHF -Patient improving. Down to baseline dry weight of 289 from 350 lb at admission -Echocardiogram show EF down from 20-25% in 2018, now 15-20% -Continue Carvedilol to 25mg  BID, continue torsemide 60mg  BID -Continue to monitor daily weights and signs of fluid overload, follow up with heart failure clinic outpatient  AKI on CKD stage 5, progressed from stage 3 -Baseline  creatinine 2.6 in 2018, believed to be near his new baseline as a result of CKD progression -Creatinine was 5.0 on admission, now down to 3.35. Continue to monitor with BMP tomorrow -Continue torsemide 60mg  BID -Nephrology following, discussed AVF for future dialysis but patient declined  Diabetes Mellitus Type II with diabetic foot ulcer -HgbA1C 11.5% -Continue monitoring CBG and insulin sliding scale -Follow up with Dr. Sharol Given outpatient for ulcer   Abnormal EKG -Wide QRS tachycardia, was given potassium, magnesium and metoprolol. Patient still in abnormal rhythm post treatments. -Cardiology consulted, started amiodarone load and taper. Likely occurred in the setting of large volume shift     DVT prophylaxis:Heparin  Code Status: Full code Family Communication: None Disposition Plan: Home when improved   Consultants:   Nephrology  Orthopedics  Cardiology  Procedures:  Echocardiogram: EF 15-20%. Severe biventricular dilatation and systolic dysfunction. No thrombus in the left ventricular apex.  Antimicrobials:   Silvadene for ulcer   Subjective: Patient reports feeling good. He has not noticed any palpitations, chest pain, shortness of breath.   Objective: Vitals:   12/06/17 1531 12/06/17 1932 12/07/17 0519 12/07/17 0849  BP:  118/89 (!) 124/94 (!) 134/97  Pulse: (!) 121 (!) 121 (!) 131 (!) 128  Resp:  18 18 16   Temp:  98.1 F (36.7 C) 98.7 F (37.1 C)   TempSrc:  Oral Oral   SpO2:  94% 96% 97%  Weight:   131.5 kg (289 lb 14.4 oz)   Height:  Intake/Output Summary (Last 24 hours) at 12/07/2017 1045 Last data filed at 12/07/2017 0932 Gross per 24 hour  Intake 1320.13 ml  Output 2480 ml  Net -1159.87 ml   Filed Weights   12/05/17 0337 12/06/17 0513 12/07/17 0519  Weight: (!) 136.2 kg (300 lb 4.8 oz) 132.8 kg (292 lb 11.2 oz) 131.5 kg (289 lb 14.4 oz)    Examination:   General: Not in pain or dyspnea Neurology: Awake and alert, non focal  E  ENT: no pallor, no icterus, oral mucosa moist Cardiovascular: + JVD. S1-S2 present, accelerated heart rate, no gallops, rubs, or murmurs. Minimal lower extremity edema, but significantly improved Pulmonary: vesicular breath sounds bilaterally, adequate air movement, no wheezing, rhonchi or rales. Gastrointestinal. Abdomen protuberant, no organomegaly, non tender, no rebound or guarding Skin. No rashes Musculoskeletal: no joint deformities     Data Reviewed: I have personally reviewed following labs and imaging studies  CBC: Recent Labs  Lab 12/01/17 0522 12/02/17 0405 12/05/17 0449  WBC 8.2 7.7 12.7*  HGB 11.4* 11.4* 12.0*  HCT 35.6* 35.3* 37.5*  MCV 87.7 87.2 88.0  PLT 269 330 045*   Basic Metabolic Panel: Recent Labs  Lab 12/03/17 0831 12/03/17 1547 12/04/17 0443 12/05/17 0449 12/06/17 0504 12/07/17 0508  NA 138 138 138 140 139 140  K 3.4* 3.6 3.3* 4.3 3.1* 4.5  CL 101 101 99* 100* 96* 95*  CO2 26 26 28 28  33* 31  GLUCOSE 127* 131* 129* 153* 126* 147*  BUN 71* 70* 67* 68* 63* 62*  CREATININE 3.50* 3.47* 3.35* 3.58* 3.23* 3.35*  CALCIUM 8.4* 8.5* 8.6* 8.3* 8.0* 8.0*  MG 2.0  --  2.0 1.8 1.7 1.9  PHOS 4.0  --  3.9 4.2 4.0 3.7   GFR: Estimated Creatinine Clearance: 40.5 mL/min (A) (by C-G formula based on SCr of 3.35 mg/dL (H)). Liver Function Tests: Recent Labs  Lab 12/03/17 0831 12/04/17 0443 12/05/17 0449 12/06/17 0504 12/07/17 0508  ALBUMIN 1.8* 1.9* 1.9* 1.9* 1.9*   No results for input(s): LIPASE, AMYLASE in the last 168 hours. No results for input(s): AMMONIA in the last 168 hours. Coagulation Profile: No results for input(s): INR, PROTIME in the last 168 hours. Cardiac Enzymes: No results for input(s): CKTOTAL, CKMB, CKMBINDEX, TROPONINI in the last 168 hours. BNP (last 3 results) No results for input(s): PROBNP in the last 8760 hours. HbA1C: No results for input(s): HGBA1C in the last 72 hours. CBG: Recent Labs  Lab 12/06/17 0721  12/06/17 1136 12/06/17 1641 12/06/17 2112 12/07/17 0757  GLUCAP 121* 137* 162* 119* 147*   Lipid Profile: No results for input(s): CHOL, HDL, LDLCALC, TRIG, CHOLHDL, LDLDIRECT in the last 72 hours. Thyroid Function Tests: No results for input(s): TSH, T4TOTAL, FREET4, T3FREE, THYROIDAB in the last 72 hours. Anemia Panel: No results for input(s): VITAMINB12, FOLATE, FERRITIN, TIBC, IRON, RETICCTPCT in the last 72 hours.    Radiology Studies: I have reviewed all of the imaging during this hospital visit personally     Scheduled Meds: . aspirin EC  81 mg Oral Daily  . carvedilol  25 mg Oral BID WC  . ferrous sulfate  325 mg Oral BID WC  . heparin  5,000 Units Subcutaneous Q8H  . insulin aspart  0-20 Units Subcutaneous TID WC  . insulin aspart  0-5 Units Subcutaneous QHS  . insulin glargine  5 Units Subcutaneous QHS  . silver sulfADIAZINE   Topical Daily  . torsemide  60 mg Oral Daily   Continuous  Infusions: . sodium chloride Stopped (12/06/17 1949)     LOS: 10 days    Carita Pian, PA-S   [facility] Pager [phone]

## 2017-12-07 NOTE — Progress Notes (Signed)
  Joshua Massey Progress Note    Assessment/ Plan:   1. CKD5: baseline creatinine 2.6 12/2016.  He had massive hypervolemia now responding to high dose diuretics. Decrease torsemide to 60 mg daily (was getting BID)--> minimize contribution of robust diuresis to tachyarrythmia  He has continued to refuse AVF--> of note, his albumin is 1.9 which ideally would be improved before surgical intervention anyway.  Had AKI which peaked at around 5, now is down to 3.2.  He has followup with me 12/18/2017 with check-in at 9:15.    2. Mild hypokalemia- repleting   3. +Fhx of ESRD and Dialysis (father, brother)  45. Chronic sCHF, binventricular; decompensated; LVEF 15-20%.    5. Morbid Obesity  6. Venous stasis ulcer on R toe- s/p antibiotics  7. DM2 uncontrolled A1c 11.5%  7.  Tachyarrhythmia- cardiology c/s pending  8. Dispo: epdning  Subjective:    Has junctional SVT- discharged cancelled yesterday.  Remains tachycardic this AM, cardioloyg c/s pending.   Objective:   BP (!) 134/97 (BP Location: Left Arm)   Pulse (!) 128   Temp 98.7 F (37.1 C) (Oral)   Resp 16   Ht 5\' 11"  (1.803 m)   Wt 131.5 kg (289 lb 14.4 oz) Comment: scale b  SpO2 97%   BMI 40.43 kg/m   Intake/Output Summary (Last 24 hours) at 12/07/2017 1125 Last data filed at 12/07/2017 0932 Gross per 24 hour  Intake 1320.13 ml  Output 2480 ml  Net -1159.87 ml   Weight change: -1.27 kg (-2 lb 12.8 oz)  Physical Exam: GEN: NAD, Morbidly obese CV: regular, tachycardic, no m/r/g today PULM: CTAB ABD: no abd wall edema EXT: edema better, 1+ edema to thigh, R toe dressed.      Imaging: No results found.  Labs: BMET Recent Labs  Lab 12/02/17 1556 12/03/17 0831 12/03/17 1547 12/04/17 0443 12/05/17 0449 12/06/17 0504 12/07/17 0508  NA 137 138 138 138 140 139 140  K 3.6 3.4* 3.6 3.3* 4.3 3.1* 4.5  CL 102 101 101 99* 100* 96* 95*  CO2 26 26 26 28 28  33* 31  GLUCOSE 105* 127* 131* 129* 153* 126* 147*   BUN 75* 71* 70* 67* 68* 63* 62*  CREATININE 3.92* 3.50* 3.47* 3.35* 3.58* 3.23* 3.35*  CALCIUM 8.2* 8.4* 8.5* 8.6* 8.3* 8.0* 8.0*  PHOS  --  4.0  --  3.9 4.2 4.0 3.7   CBC Recent Labs  Lab 12/01/17 0522 12/02/17 0405 12/05/17 0449  WBC 8.2 7.7 12.7*  HGB 11.4* 11.4* 12.0*  HCT 35.6* 35.3* 37.5*  MCV 87.7 87.2 88.0  PLT 269 330 429*    Medications:    . aspirin EC  81 mg Oral Daily  . carvedilol  25 mg Oral BID WC  . ferrous sulfate  325 mg Oral BID WC  . heparin  5,000 Units Subcutaneous Q8H  . insulin aspart  0-20 Units Subcutaneous TID WC  . insulin aspart  0-5 Units Subcutaneous QHS  . insulin glargine  5 Units Subcutaneous QHS  . silver sulfADIAZINE   Topical Daily  . torsemide  60 mg Oral Daily      [name], MD 12/07/2017, 11:25 AM

## 2017-12-07 NOTE — Progress Notes (Signed)
Discharge to home. D/c instructions and follow up appointment discussed with patient, verbalized understanding.

## 2017-12-07 NOTE — Consult Note (Addendum)
Advanced Heart Failure Team Consult Note   Primary Physician: [name], [name], MD PCP-Cardiologist:  No primary care provider on file.  Reason for Consultation: Aflutter in CHF patient  HPI:    Joshua Massey is seen today for evaluation of Aflutter at the request of Dr. Loleta Books.   Joshua Massey is a 42 y.o. male with history of poorly controlled DM2, HTN, CKD V not on HD, Obesity, and chronic systolic CHF due to NICM.   Last seen by HF team 10/11/2016. Recommended for follow up Echo for ICD consideration, but lost to follow up.  Pt presented to [facility]/2017 with 4-5 weeks of worsening SOB and progressive BLE edema. He noted an approximate 60 lb weight gain in this time period. He reported liberal intake of salty food, but reported on-going medical noncompliance.  Pertinent labs on admission include K 4.1, Cr 5.0 (up from presumed baseline of 2.3-2.3), BUN 63, Na 136, BNP 707, Hgb 11.2, and WBC 13.1. Nephrology consulted and recommended for permanent HD access, but patient continually refused.  Pt was able to be diuresed with high dose IV lasix, with total of 70 lbs of diuresis since admission 10 days ago. Currently denies orthopnea, PND or DOE. No palpitations. Very eager to go home Has not followed up on sleep study.  Pt was planned for discharge 12/06/17 but developed a tachyarrhythmia. Cardiology consulted am of 12/07/17.   By review of telemetry he has been in and out of what appears to be an atrial flutter or atrial tachycardia. He is currently in NSR.   He feels much better from admission. He states he was taking his medicines only 3-4 times a weeks, and on some days would skip his meds completely. He was not monitoring his fluid or salt intake.  Currently, he denies SOB, lightheadedness or dizziness. No CP or palpitations. Wants to go home.  When asked about dialysis, he says that's something he "just can't think about right now".    Echo 11/28/2017 15-20%, Grade 2 DD, Mild MR,  Severe LAE, RV severely dilated and reduced function, Mod RAE, Mild TR, PA peak pressure 42 mm Hg.   Review of Systems: [y] = yes, [ ]  = no   General: Weight gain [y]; Weight loss [ ] ; Anorexia [ ] ; Fatigue [y]; Fever [ ] ; Chills [ ] ; Weakness [y]  Cardiac: Chest pain/pressure [ ] ; Resting SOB [ ] ; Exertional SOB [y]; Orthopnea [y]; Pedal Edema [y]; Palpitations [ ] ; Syncope [ ] ; Presyncope [ ] ; Paroxysmal nocturnal dyspnea[ ]   Pulmonary: Cough [ ] ; Wheezing[ ] ; Hemoptysis[ ] ; Sputum [ ] ; Snoring [ ]   GI: Vomiting[ ] ; Dysphagia[ ] ; Melena[ ] ; Hematochezia [ ] ; Heartburn[ ] ; Abdominal pain [ ] ; Constipation [ ] ; Diarrhea [ ] ; BRBPR [ ]   GU: Hematuria[ ] ; Dysuria [ ] ; Nocturia[ ]   Vascular: Pain in legs with walking [ ] ; Pain in feet with lying flat [ ] ; Non-healing sores [ ] ; Stroke [ ] ; TIA [ ] ; Slurred speech [ ] ;  Neuro: Headaches[ ] ; Vertigo[ ] ; Seizures[ ] ; Paresthesias[ ] ;Blurred vision [ ] ; Diplopia [ ] ; Vision changes [ ]   Ortho/Skin: Arthritis [y]; Joint pain [ ] ; Muscle pain [ ] ; Joint swelling [ ] ; Back Pain [ ] ; Rash [ ]   Psych: Depression[ ] ; Anxiety[ ]   Heme: Bleeding problems [ ] ; Clotting disorders [ ] ; Anemia [ ]   Endocrine: Diabetes Blue.Reese ]; Thyroid dysfunction[ ]   Home Medications Prior to Admission medications   Medication Sig Start  Date End Date Taking? Authorizing Provider  acetaminophen (TYLENOL) 500 MG tablet Take 1,000 mg by mouth every 6 (six) hours as needed for moderate pain or headache.   Yes [provider]  aspirin 81 MG tablet Take 81 mg by mouth daily.   Yes [provider]  carvedilol (COREG) 25 MG tablet TAKE 1 TABLET BY MOUTH TWICE DAILY WITH  A  MEAL 08/18/17  Yes [name], [name], MD  hydrALAZINE (APRESOLINE) 100 MG tablet Take 1 tablet (100 mg total) by mouth 3 (three) times daily. 10/11/16  Yes Shirley Friar, PA-C  isosorbide mononitrate (IMDUR) 60 MG 24 hr tablet TAKE 1 & 1/2 (ONE & ONE-HALF) TABLETS BY MOUTH ONCE DAILY 08/18/17  Yes  [name], [name], MD  torsemide (DEMADEX) 20 MG tablet TAKE 3 TABLETS BY MOUTH ONCE DAILY 09/18/17  Yes [name], [name], MD  atorvastatin (LIPITOR) 10 MG tablet Take 1 tablet (10 mg total) by mouth daily with supper. Patient not taking: Reported on 11/27/2017 12/22/16   [name], [name], MD  carvedilol (COREG) 12.5 MG tablet Take 1 tablet (12.5 mg total) by mouth 2 (two) times daily with a meal. 12/06/17   Danford, Suann Larry, MD  diclofenac sodium (VOLTAREN) 1 % GEL Apply 4 g topically 4 (four) times daily. Patient not taking: Reported on 11/27/2017 12/20/16   [name], [name], MD  ferrous sulfate 325 (65 FE) MG tablet Take 1 tablet (325 mg total) by mouth 2 (two) times daily with a meal. Patient not taking: Reported on 11/27/2017 11/02/16   [name], [name], MD  gabapentin (NEURONTIN) 300 MG capsule Take 1 capsule (300 mg total) by mouth 2 (two) times daily. Patient not taking: Reported on 11/27/2017 12/20/16   [name], [name], MD  glucose blood (FREESTYLE TEST STRIPS) test strip Use as instructed 09/02/16   Jonetta Osgood, MD  glucose monitoring kit (FREESTYLE) monitoring kit 1 each by Does not apply route 4 (four) times daily - after meals and at bedtime. 1 month Diabetic Testing Supplies for QAC-QHS accuchecks. 09/02/16   Ghimire, Henreitta Leber, MD  insulin aspart (NOVOLOG FLEXPEN) 100 UNIT/ML FlexPen 0-15 Units, Subcutaneous, 3 times daily with meals CBG < 70: implement hypoglycemia protocol-call MD CBG 70 - 120: 0 units CBG 121 - 150: 2 units CBG 151 - 200: 3 units CBG 201 - 250: 5 units CBG 251 - 300: 8 units CBG 301 - 350: 11 units CBG 351 - 400: 15 units CBG > 400:Call MD Patient not taking: Reported on 11/27/2017 09/02/16   Jonetta Osgood, MD  Insulin Glargine (LANTUS SOLOSTAR) 100 UNIT/ML Solostar Pen Inject 20 Units into the skin every morning. And pen needles 1/day Patient not taking: Reported on 11/27/2017 12/22/16   [name], [name], MD  insulin glargine (LANTUS) 100 UNIT/ML injection Inject  0.05 mLs (5 Units total) into the skin at bedtime. 12/06/17   Danford, Suann Larry, MD  Insulin Pen Needle 32G X 8 MM MISC Use as directed 09/02/16   Ghimire, Henreitta Leber, MD  isosorbide-hydrALAZINE (BIDIL) 20-37.5 MG tablet Take 1 tablet by mouth 3 (three) times daily. 12/06/17   Danford, Suann Larry, MD  Lancets (FREESTYLE) lancets Use as instructed 09/02/16   Ghimire, Henreitta Leber, MD  Lidocaine 3.75 % CREA Apply 1 application topically 3 (three) times daily as needed. Patient not taking: Reported on 11/27/2017 12/20/16   [name], [name], MD  pantoprazole (PROTONIX) 40 MG tablet Take 1 tablet (40 mg total) by mouth daily  before breakfast. Patient not taking: Reported on 11/27/2017 11/02/16   [name], [name], MD  potassium chloride SA (KLOR-CON M20) 20 MEQ tablet Take 2 tablets (40 mEq total) by mouth daily. 12/06/17   Danford, Suann Larry, MD  torsemide (DEMADEX) 20 MG tablet Take 3 tablets (60 mg total) by mouth 2 (two) times daily. 12/06/17   Danford, Suann Larry, MD  Vitamin D, Ergocalciferol, (DRISDOL) 50000 units CAPS capsule Take 1 capsule by mouth once weekly for 16 weeks, and then every 2 weeks. Patient not taking: Reported on 11/27/2017 12/22/16   [name], [name], MD   Past Medical History: Past Medical History:  Diagnosis Date  . 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 Diabetes mellitus    adult-onset, type 2  . Exogenous obesity   . GERD (gastroesophageal reflux disease)   . Hypertension    Past Surgical History: Past Surgical History:  Procedure Laterality Date  . ANKLE SURGERY  2003   plate and screws  . CARDIAC CATHETERIZATION  02/16/2012   50% LAD lesion in mid vessel, otherwise no significant obstructive CAD  . HIP PINNING  1990   bilateral  . IR GENERIC HISTORICAL  09/01/2016   IR FLUORO GUIDE CV LINE RIGHT 09/01/2016 Marybelle Killings, MD [facility]  . IR GENERIC HISTORICAL  09/01/2016   IR US GUIDE VASC ACCESS RIGHT 09/01/2016 Marybelle Killings, MD  [facility]  . IR GENERIC HISTORICAL  09/16/2016   IR REMOVAL TUN CV CATH W/O FL 09/16/2016 Arne Cleveland, MD [facility]  . KNEE ARTHROSCOPY Left 08/30/2016   Procedure: ARTHROSCOPY KNEE;  Surgeon: Renette Butters, MD;  Location: [place];  Service: Orthopedics;  Laterality: Left;  . LEFT AND RIGHT HEART CATHETERIZATION WITH CORONARY ANGIOGRAM N/A 02/16/2012   Procedure: LEFT AND RIGHT HEART CATHETERIZATION WITH CORONARY ANGIOGRAM;  Surgeon: Pixie Casino, MD;  Location: [facility] CATH LAB;  Service: Cardiovascular;  Laterality: N/A;  . TRANSTHORACIC ECHOCARDIOGRAM  08/31/2012   EF 35-40%, no significant wall motion abnormalities, diastolic relaxation abnormality - no longer needed LifeVest; LV cavity size mod dilated with mod conc hypertrophy, systolic function mod reduced; mild MR, LA mod dilated   Family History: Family History  Problem Relation Age of Onset  . Coronary artery disease Brother        CABG at 59, DM  . Heart disease Brother 55       CAD  . Diabetes Mother   . Hypertension Mother   . Hyperlipidemia Mother   . Heart disease Mother   . Diabetes Father   . Hypertension Father    Social History: Social History   Socioeconomic History  . Marital status: Single    Spouse name: Not on file  . Number of children: 6  . Years of education: 46  . Highest education level: Not on file  Occupational History  . Occupation: Brewing technologist    Comment: [name]  . Occupation: newpaper delivery    Employer: [employer]  Social Needs  . Financial resource strain: Not on file  . Food insecurity:    Worry: Not on file    Inability: Not on file  . Transportation needs:    Medical: Not on file    Non-medical: Not on file  Tobacco Use  . Smoking status: Never Smoker  . Smokeless tobacco: Never Used  Substance and Sexual Activity  . Alcohol use: No  . Drug use: No  . Sexual  activity: Yes    Birth control/protection: Condom  Lifestyle  . Physical activity:      Days per week: Not on file    Minutes per session: Not on file  . Stress: Not on file  Relationships  . Social connections:    Talks on phone: Not on file    Gets together: Not on file    Attends religious service: Not on file    Active member of club or organization: Not on file    Attends meetings of clubs or organizations: Not on file    Relationship status: Not on file  Other Topics Concern  . Not on file  Social History Narrative  . Not on file   Allergies:  No Known Allergies  Objective:    Vital Signs:   Temp:  [97.9 F (36.6 C)-98.7 F (37.1 C)] 98.7 F (37.1 C) (06/20 0519) Pulse Rate:  [121-140] 128 (06/20 0849) Resp:  [16-20] 16 (06/20 0849) BP: (82-134)/(67-97) 134/97 (06/20 0849) SpO2:  [94 %-97 %] 97 % (06/20 0849) Weight:  [289 lb 14.4 oz (131.5 kg)] 289 lb 14.4 oz (131.5 kg) (06/20 0519) Last BM Date: 12/06/17  Weight change: Filed Weights   12/05/17 0337 12/06/17 0513 12/07/17 0519  Weight: (!) 300 lb 4.8 oz (136.2 kg) 292 lb 11.2 oz (132.8 kg) 289 lb 14.4 oz (131.5 kg)   Intake/Output:   Intake/Output Summary (Last 24 hours) at 12/07/2017 1133 Last data filed at 12/07/2017 0932 Gross per 24 hour  Intake 1320.13 ml  Output 2480 ml  Net -1159.87 ml    Physical Exam    General:  Well appearing. No resp difficulty HEENT: normal Neck: supple. JVP does not appear elevated. Carotids 2+ bilat; no bruits. No lymphadenopathy or thyromegaly appreciated. Cor: PMI nondisplaced. Regular rate & rhythm. No rubs, gallops or murmurs. Lungs: clear Abdomen: soft, nontender, nondistended. No hepatosplenomegaly. No bruits or masses. Good bowel sounds. Extremities: no cyanosis, clubbing, or rash. + Chronic venous stasis changes. Trace ankle edema.  Neuro: alert & orientedx3, cranial nerves grossly intact. moves all 4 extremities w/o difficulty. Affect pleasant  Telemetry   NSR 80s currently, has been in and out of an Aflutter/Atach up into the 110-120s,  personally reviewed.   EKG    Appears to show Aflutter vs Atach in 120s, personally reviewed.  Labs   Basic Metabolic Panel: Recent Labs  Lab 12/03/17 0831 12/03/17 1547 12/04/17 0443 12/05/17 0449 12/06/17 0504 12/07/17 0508  NA 138 138 138 140 139 140  K 3.4* 3.6 3.3* 4.3 3.1* 4.5  CL 101 101 99* 100* 96* 95*  CO2 26 26 28 28  33* 31  GLUCOSE 127* 131* 129* 153* 126* 147*  BUN 71* 70* 67* 68* 63* 62*  CREATININE 3.50* 3.47* 3.35* 3.58* 3.23* 3.35*  CALCIUM 8.4* 8.5* 8.6* 8.3* 8.0* 8.0*  MG 2.0  --  2.0 1.8 1.7 1.9  PHOS 4.0  --  3.9 4.2 4.0 3.7    Liver Function Tests: Recent Labs  Lab 12/03/17 0831 12/04/17 0443 12/05/17 0449 12/06/17 0504 12/07/17 0508  ALBUMIN 1.8* 1.9* 1.9* 1.9* 1.9*   No results for input(s): LIPASE, AMYLASE in the last 168 hours. No results for input(s): AMMONIA in the last 168 hours.  CBC: Recent Labs  Lab 12/01/17 0522 12/02/17 0405 12/05/17 0449  WBC 8.2 7.7 12.7*  HGB 11.4* 11.4* 12.0*  HCT 35.6* 35.3* 37.5*  MCV 87.7 87.2 88.0  PLT 269 330 429*    Cardiac Enzymes: No results  for input(s): CKTOTAL, CKMB, CKMBINDEX, TROPONINI in the last 168 hours.  BNP: BNP (last 3 results) Recent Labs    11/27/17 1422  BNP 706.8*    ProBNP (last 3 results) No results for input(s): PROBNP in the last 8760 hours.   CBG: Recent Labs  Lab 12/06/17 0721 12/06/17 1136 12/06/17 1641 12/06/17 2112 12/07/17 0757  GLUCAP 121* 137* 162* 119* 147*    Coagulation Studies: No results for input(s): LABPROT, INR in the last 72 hours.   Imaging    No results found.   Medications:     Current Medications: . aspirin EC  81 mg Oral Daily  . carvedilol  25 mg Oral BID WC  . ferrous sulfate  325 mg Oral BID WC  . heparin  5,000 Units Subcutaneous Q8H  . insulin aspart  0-20 Units Subcutaneous TID WC  . insulin aspart  0-5 Units Subcutaneous QHS  . insulin glargine  5 Units Subcutaneous QHS  . silver sulfADIAZINE   Topical  Daily  . torsemide  60 mg Oral Daily     Infusions: . sodium chloride Stopped (12/06/17 1949)       Patient Profile   CEJAY CAMBRE is a 42 y.o. male with history of poorly controlled DM2, HTN, CKD V not on HD, Obesity, and chronic systolic CHF due to NICM  Admitted 11/27/17 with significant volume overload and ARF.  Diuresed 70 lbs with renal directing diuresis. Pt refused HD consideration and AVF placement.  On potential day of discharge developed tachyarrhythmia. Cardiology consulted.   Assessment/Plan   1. A-flutter/Atach, paroxysmal. - By EKG this am and in and out on tele.  - He is currently in NSR.  - Will start amiodarone load and taper at 400 mg BID x 1 week, and then 200 mg BID x 1 week, and then 200 mg daily so long as he remains in NSR.  - Goal K > 4.0 and Mg > 2.0. Stable today.  - If recurs and does not convert, would need TEE DCCV.  - CHA2DS2-VASc is 3. Will need AC. Will start Eliquis 5 mg BID.   2. Acute on chronic biventricular CHF - Echo 11/28/2017 15-20%, Grade 2 DD, Mild MR, Severe LAE, RV severely dilated and reduced function, Mod RAE, Mild TR, PA peak pressure 42 mm Hg.  - Last LHC 01/2012 with moderate tubular mid-LAD lesion, otherwise no significant obstructive CAD. - Volume status significantly improved with diuretic dosing per renal.  - Continue coreg 25 mg BID - No ACE/ARB/ARNI or spiro with CKD V.  - Not candidate for VAD with biventricular failure and CKD V.  - Would need Heart Kidney/Transplant, but has long history of non-compliance, so not sure how well he could navigate this process, or if he would have adequate social support.   3. ARF on CKD V - Creatinine 3.35 today. Unclear what his new baseline will be - He has repeatedly refused AVF placement.   4. HTN - Meds as above.   5. DM2  - Hgb A1c 11.5 11/28/17 - Per primary.   6. Morbid Obesity - Body mass index is 40.43 kg/m.  - Encouraged weight loss.   Meds as above. Would start  amio and watch overnight. Hopefully can send home tomorrow with close follow up if remains stable. (HF follow up already set up)  Medication concerns reviewed with patient and pharmacy team. Barriers identified: Non-compliance.  Length of Stay: [place], [place]  12/07/2017, 11:33 AM  Advanced Heart Failure Team Pager [phone] (M-F; [place])  Please contact [facility] for night-coverage after hours (4p -7a ) and weekends on [email or web address]  Patient seen and examined with the above-signed Advanced Practice Provider and/or Housestaff. I personally reviewed laboratory data, imaging studies and relevant notes. I independently examined the patient and formulated the important aspects of the plan. I have edited the note to reflect any of my changes or salient points. I have personally discussed the plan with the patient and/or family.  42 y/o male with obesity, DM2, CKD IV, HTN and severe systolic HF due to NICM with EF 15-20%. Previously seen in HF Clinic but did not f/u. Has not followed up with recommendations for ICD and sleep study. Has been noncompliant with meds. Admitted with 70 pound weight gain. Creatinine 2.5. Diuretics managed by Renal. Weight now down 70 pounds. This am found to be tachycardic. Review of ECGs shows likely atrial flutter. Had converted back to NSR. Will start po amio and Eliquis. Stressed need to f/u in HF Clinic.  On exam  General:  Sitting up in bed. No resp difficulty HEENT: normal Neck: supple. no JVP 6 Carotids 2+ bilat; no bruits. No lymphadenopathy or thryomegaly appreciated. Cor: PMI laterally displaced. Regular rate & rhythm. No rubs, gallops or murmurs. Lungs: clear Abdomen: obese soft, nontender, nondistended. No hepatosplenomegaly. No bruits or masses. Good bowel sounds. Extremities: no cyanosis, clubbing, rash, edema Neuro: alert & orientedx3, cranial nerves grossly intact. moves all 4 extremities w/o difficulty. Affect pleasant  Ok to dc  home. Start amio as above and Eliquis.   Joshua Bickers, MD  6:20 PM

## 2017-12-07 NOTE — Care Management Note (Signed)
Case Management Note  Patient Details  Name: ARTAVIS COWIE MRN: [id number] Date of Birth: Jul 28, 1975  Subjective/Objective:                    Action/Plan:  Eliquis 30 day free card and $10 co pay card given Expected Discharge Date:  12/07/17               Expected Discharge Plan:  [place]  In-House Referral:     Discharge planning Services  CM Consult  Post Acute Care Choice:    Choice offered to:  Patient  DME Arranged:  N/A DME Agency:  NA  HH Arranged:  RN, Disease Management, PT [place] Agency:  [place]  Status of Service:  Completed, signed off  If discussed at [place] of Stay Meetings, dates discussed:    Additional Comments:  Marilu Favre, RN 12/07/2017, 4:41 PM

## 2017-12-07 NOTE — Discharge Summary (Signed)
Physician Discharge Summary  Joshua Massey LTJ:[id number] DOB: 1976-06-17 DOA: 11/27/2017  PCP: [name], [name], MD  Admit date: 11/27/2017 Discharge date: 12/07/2017  Admitted From: Home  Disposition:  Home   Recommendations for Outpatient Follow-up:  1. Follow up with PCP in 1 weeks 2. Follow up with Cardiology in 2 weeks, July 9 3. Follow up with Nephrology, Dr. Hollie Salk in 1-2 weeks 4. Follow up with Dr. Sharol Given in 1 week for foot ulcer 5. Please obtain BMP in one week   Home Health: HHPT  Equipment/Devices: Rolling walker  Discharge Condition: Good  CODE STATUS: FULL Diet recommendation: Diabetic, low sodium  Brief/Interim Summary: Joshua Massey is a 42 y.o. M with chronic systolicCHFEF 07%, DM2, HTN,CKD stage 3,GERD whohas not been seen by a physician in almost a year, whopresented to the ED with c/o "extra fluid" and weight gain. Over the past 4-5 weeks pt has gained approx60lbs, up to 350 lbs from his baseline of 290 lb. He has had progressiveBLEedema R>L, scrotal swelling and creatinine was 5.5 on admission, from baseline of 2.5.  Nephrology were consulted and patient was started on high-dose diuretics.       Acute renal failure on chronic kidney disease stage III Creatinine was doubled to 5.5 mg/dL from baseline 2 mg/dl on admission with severe fluid overload.  Nephrology were consulted.  He was started on high-dose diuretics (IV torsemide and metolazone), and renal function improved steadily with diuresis.  Acute on chronic systolic CHF Bilateral lower extremity and scrotal edema resolved with diuretics.  He was continued on torsemide once daily at discharge.  He has close follow-up with nephrology.  Atrial flutter, typical On initial day of intended discharge, the patient developed tachycardia, rate 120, regular.  ECG was initially interpreted as junctional tachycardia, but we reviewed by the congestive heart failure team they believe that this was atrial flutter.   He was given magnesium and potassium as well as IV metoprolol on his heart rate returned to sinus.  Cardiology recommended oral initiation of amiodarone and starting Eliquis.  Patient is close cardiology follow-up.  Diabetes With an A1c during this hospitalization was >11%.  His sugars were well controlled with 5 units of glargine and low-dose sliding scale.             Discharge Diagnoses:  Principal Problem:   Volume overload Active Problems:   Hypertensive heart disease with heart failure (HCC)   Hypertension   Non-ischemic cardiomyopathy, EF < 20%   Obesity   Acute on chronic systolic CHF (congestive heart failure) (HCC)   Coronary artery disease, 50% LAD at cath 02/16/12   Type 2 diabetes mellitus with vascular disease ([place])   Acute renal failure (HCC)   CKD (chronic kidney disease), stage III (HCC)   Hypovitaminosis D   Diabetic foot ulcer (HCC)   Cellulitis in diabetic foot ([place])   Acute heart failure (HCC)   Hepatopathy   Chronic venous hypertension (idiopathic) with ulcer and inflammation of bilateral lower extremity [facility])    Discharge Instructions  Discharge Instructions    (HEART FAILURE PATIENTS) Call MD:  Anytime you have any of the following symptoms: 1) 3 pound weight gain in 24 hours or 5 pounds in 1 week 2) shortness of breath, with or without a dry hacking cough 3) swelling in the hands, feet or stomach 4) if you have to sleep on extra pillows at night in order to breathe.   Complete by:  As directed    Diet -  low sodium heart healthy   Complete by:  As directed    Discharge instructions   Complete by:  As directed    From Dr. Loleta Books: You were admitted with very large fluid overload from a flare of your congestive heart failure.  This was caused by too much sodium, not good blood sugars, and missed doses of torsemide, in addition to the fact that your kidney function got a little worse (this is now back to where it was).  The day before you left, we  found out that you were in an abnormal heart rhythm (one that is common in CHF like yours), called atrial flutter.     ----------When you go home--------------- For your heart:     Take torsemide 60 mg once daily    Follow up with Dr. Hollie Salk in 1-2 weeks (her scheduler should have called you already to schedule this, if not, call [phone] and ask for a hospital follow up in 1-2 weeks)    Follow up with Dr. [name] in 1 week to check your kidney function.    Take potassium 40 mEq (two pills) daily    Continue your Coreg/carvedilol 25 mg twice daily (double check this dose)      Resume hydralazine and Isordil.      This is a combo pill called BiDil, take 20-37.5 mg three times a day.    If the BiDil is expensive, you can wait for now, and when you follow up with the heart failure Clinic and Dr. Missy Sabins, they can refill hydralazine and isordil then.    For the abnormal heart rhythm:    This heart rhythm can cause strokes (if blood clots form in the little nooks and crannies of the heart), so it is important to take a blood thinner.        Take apixaban/Eliquis 5 mg twice daily        If you would ever notice bloody bowel movements or black and tarry, asphalt-like bowel movements, call your doctor immmmmmmediately.         Also, take amiodarone with the following taper:        Take 400 mg (two tabs) twice daily for 1 week until next Thursday, then         Take 200 mg (1 tab) twice daily for 1 week, until July 4, then        Take 200 mg (1 tab) daily; Dr. Belenda Cruise clinic will refill     Reduce your Lantus dose to 5 units daily. Use your home sliding scale short-acting insulin.  Follow up with Dr. Sharol Given in 1 week   Phone numbers: Dr. Hollie Salk (kidney doctor) - [phone] Dr. Sharol Given (foot doctor) - [phone] Dr. Missy Sabins (heart doctor) - [phone]   Increase activity slowly   Complete by:  As directed      Allergies as of 12/07/2017   No Known Allergies      Medication List    STOP taking these medications   aspirin 81 MG tablet   diclofenac sodium 1 % Gel Commonly known as:  VOLTAREN   gabapentin 300 MG capsule Commonly known as:  NEURONTIN   hydrALAZINE 100 MG tablet Commonly known as:  APRESOLINE   Insulin Glargine 100 UNIT/ML Solostar Pen Commonly known as:  LANTUS SOLOSTAR Replaced by:  insulin glargine 100 UNIT/ML injection   isosorbide mononitrate 60 MG 24 hr tablet Commonly known as:  IMDUR   Lidocaine 3.75 % Crea     TAKE  these medications   acetaminophen 500 MG tablet Commonly known as:  TYLENOL Take 1,000 mg by mouth every 6 (six) hours as needed for moderate pain or headache.   amiodarone 200 MG tablet Commonly known as:  PACERONE Take 400 mg (2 tabs) twice daily for 1 week, then take 200 mg (1 tab) twice daily for 1 week, then take 200 mg (1 tab) daily.   apixaban 5 MG Tabs tablet Commonly known as:  ELIQUIS Take 1 tablet (5 mg total) by mouth 2 (two) times daily.   atorvastatin 10 MG tablet Commonly known as:  LIPITOR Take 1 tablet (10 mg total) by mouth daily with supper.   carvedilol 25 MG tablet Commonly known as:  COREG Take 1 tablet (25 mg total) by mouth 2 (two) times daily with a meal. Start taking on:  12/08/2017 What changed:  See the new instructions.   ferrous sulfate 325 (65 FE) MG tablet Take 1 tablet (325 mg total) by mouth 2 (two) times daily with a meal.   freestyle lancets Use as instructed   glucose blood test strip Commonly known as:  FREESTYLE TEST STRIPS Use as instructed   glucose monitoring kit monitoring kit 1 each by Does not apply route 4 (four) times daily - after meals and at bedtime. 1 month Diabetic Testing Supplies for QAC-QHS accuchecks.   insulin aspart 100 UNIT/ML FlexPen Commonly known as:  NOVOLOG FLEXPEN 0-15 Units, Subcutaneous, 3 times daily with meals CBG < 70: implement hypoglycemia protocol-call MD CBG 70 - 120: 0 units CBG 121 - 150: 2 units CBG  151 - 200: 3 units CBG 201 - 250: 5 units CBG 251 - 300: 8 units CBG 301 - 350: 11 units CBG 351 - 400: 15 units CBG > 400:Call MD   insulin glargine 100 UNIT/ML injection Commonly known as:  LANTUS Inject 0.05 mLs (5 Units total) into the skin at bedtime. Replaces:  Insulin Glargine 100 UNIT/ML Solostar Pen   Insulin Pen Needle 32G X 8 MM Misc Use as directed   isosorbide-hydrALAZINE 20-37.5 MG tablet Commonly known as:  BIDIL Take 1 tablet by mouth 3 (three) times daily.   pantoprazole 40 MG tablet Commonly known as:  PROTONIX Take 1 tablet (40 mg total) by mouth daily before breakfast.   potassium chloride SA 20 MEQ tablet Commonly known as:  KLOR-CON M20 Take 2 tablets (40 mEq total) by mouth daily. What changed:  how much to take   torsemide 20 MG tablet Commonly known as:  DEMADEX Take 3 tablets (60 mg total) by mouth daily.   Vitamin D (Ergocalciferol) 50000 units Caps capsule Commonly known as:  DRISDOL Take 1 capsule by mouth once weekly for 16 weeks, and then every 2 weeks.      Follow-up Information    Newt Minion, MD Follow up in 1 week(s).   Specialty:  Orthopedic Surgery Contact information: [place] [address] [place] Follow up.   Why:  They will see you at your home for home health care services Contact information: [place] [address] [phone]        [name], [name], MD On 12/11/2017.   Specialty:  Family Medicine Why:  _0 :15 am Contact information: [place] [address] [phone]        [place]. Go on 12/26/2017.  Specialty:  Cardiology Why:  9:30 AM, [facility], parking code 1400 Contact information: [address] [id number] [place] [phone]         No Known  Allergies  Consultations:  Cardiology  Nephrology   Procedures/Studies: Dg Chest 2 View  Result Date: 11/27/2017 CLINICAL DATA:  Increased fluid buildup.  Weight gain. EXAM: CHEST - 2 VIEW COMPARISON:  08/16/2016 FINDINGS: There is marked enlargement of the cardiac silhouette. No pleural effusion or edema. No airspace opacities identified. The visualized osseous structures appear intact. IMPRESSION: 1. No acute cardiopulmonary abnormalities. 2. Cardiac enlargement as before. Electronically Signed   By: Kerby Moors M.D.   On: 11/27/2017 15:00   Dg Toe Great Right  Result Date: 11/27/2017 CLINICAL DATA:  Foot ulcer. EXAM: RIGHT GREAT TOE COMPARISON:  None. FINDINGS: Three-view exam of the right great toe shows a soft tissue defect compatible with ulcer. No underlying bony destruction or change to suggest osteomyelitis by plain film exam. Soft tissue swelling is evident. IMPRESSION: No radiographic evidence for osteomyelitis. Electronically Signed   By: Misty Stanley M.D.   On: 11/27/2017 14:57      Subjective: Feels well. No dyspnea, orthopnea, chest pain, palpitations, chest tightness, chest heaviness.  Dyspnea on exertion.  Discharge Exam: Vitals:   12/07/17 0849 12/07/17 1221  BP: (!) 134/97 116/89  Pulse: (!) 128 85  Resp: 16 20  Temp:  98.2 F (36.8 C)  SpO2: 97% 97%   Vitals:   12/06/17 1932 12/07/17 0519 12/07/17 0849 12/07/17 1221  BP: 118/89 (!) 124/94 (!) 134/97 116/89  Pulse: (!) 121 (!) 131 (!) 128 85  Resp: _0 Temp: 98.1 F (36.7 C) 98.7 F (37.1 C)  98.2 F (36.8 C)  TempSrc: Oral Oral  Oral  SpO2: 94% 96% 97% 97%  Weight:  131.5 kg (289 lb 14.4 oz)    Height:        General: Pt is alert, awake, not in acute distress Cardiovascular: RRR, S1/S2 +, no rubs, no gallops Respiratory: CTA bilaterally, no wheezing, no rhonchi Abdominal: Soft, NT, ND, bowel sounds + Extremities: no edema, no cyanosis    The results of significant diagnostics  from this hospitalization (including imaging, microbiology, ancillary and laboratory) are listed below for reference.     Microbiology: Recent Results (from the past 240 hour(s))  Culture, blood (routine x 2)     Status: None   Collection Time: 11/28/17 12:46 AM  Result Value Ref Range Status   Specimen Description BLOOD RIGHT ANTECUBITAL  Final   Special Requests   Final    BOTTLES DRAWN AEROBIC AND ANAEROBIC Blood Culture adequate volume   Culture   Final    NO GROWTH 5 DAYS Performed at [facility], [address], [place], [place] [address]    Report Status 12/03/2017 FINAL  Final  Culture, blood (routine x 2)     Status: None   Collection Time: 11/28/17 12:46 AM  Result Value Ref Range Status   Specimen Description BLOOD RIGHT ANTECUBITAL  Final   Special Requests   Final    BOTTLES DRAWN AEROBIC AND ANAEROBIC Blood Culture adequate volume   Culture   Final    NO GROWTH 5 DAYS Performed at [facility], [place] [address] [place], [place] [address]    Report Status 12/03/2017 FINAL  Final     Labs: BNP (last 3 results) Recent Labs    11/27/17 1422  BNP 761.6*   Basic Metabolic Panel: Recent Labs  Lab 12/03/17 0831 12/03/17 1547 12/04/17 0443 12/05/17 0449 12/06/17 0504 12/07/17 0508  NA 138 138 138 140 139 140  K 3.4* 3.6 3.3* 4.3 3.1* 4.5  CL 101 101 99* 100* 96* 95*  CO2 _0 33* 31  GLUCOSE 127* 131* 129* 153* 126* 147*  BUN 71* 70* 67* 68* 63* 62*  CREATININE 3.50* 3.47* 3.35* 3.58* 3.23* 3.35*  CALCIUM 8.4* 8.5* 8.6* 8.3* 8.0* 8.0*  MG 2.0  --  2.0 1.8 1.7 1.9  PHOS 4.0  --  3.9 4.2 4.0 3.7   Liver Function Tests: Recent Labs  Lab 12/03/17 0831 12/04/17 0443 12/05/17 0449 12/06/17 0504 12/07/17 0508  ALBUMIN 1.8* 1.9* 1.9* 1.9* 1.9*   No results for input(s): LIPASE, AMYLASE in the last 168 hours. No results for input(s): AMMONIA in the last 168 hours. CBC: Recent Labs  Lab 12/01/17 0522 12/02/17 0405 12/05/17 0449   WBC 8.2 7.7 12.7*  HGB 11.4* 11.4* 12.0*  HCT 35.6* 35.3* 37.5*  MCV 87.7 87.2 88.0  PLT 269 330 429*   Cardiac Enzymes: No results for input(s): CKTOTAL, CKMB, CKMBINDEX, TROPONINI in the last 168 hours. BNP: Invalid input(s): POCBNP CBG: Recent Labs  Lab 12/06/17 1641 12/06/17 2112 12/07/17 0757 12/07/17 1134 12/07/17 1628  GLUCAP 162* 119* 147* 152* 129*   D-Dimer No results for input(s): DDIMER in the last 72 hours. Hgb A1c No results for input(s): HGBA1C in the last 72 hours. Lipid Profile No results for input(s): CHOL, HDL, LDLCALC, TRIG, CHOLHDL, LDLDIRECT in the last 72 hours. Thyroid function studies No results for input(s): TSH, T4TOTAL, T3FREE, THYROIDAB in the last 72 hours.  Invalid input(s): FREET3 Anemia work up No results for input(s): VITAMINB12, FOLATE, FERRITIN, TIBC, IRON, RETICCTPCT in the last 72 hours. Urinalysis    Component Value Date/Time   COLORURINE AMBER (A) 08/17/2016 0129   APPEARANCEUR HAZY (A) 08/17/2016 0129   LABSPEC 1.016 08/17/2016 0129   PHURINE 5.0 08/17/2016 0129   GLUCOSEU 50 (A) 08/17/2016 0129   HGBUR SMALL (A) 08/17/2016 0129   BILIRUBINUR NEGATIVE 08/17/2016 0129   KETONESUR NEGATIVE 08/17/2016 0129   PROTEINUR >=300 (A) 08/17/2016 0129   UROBILINOGEN 0.2 09/02/2009 0935   NITRITE NEGATIVE 08/17/2016 0129   LEUKOCYTESUR NEGATIVE 08/17/2016 0129   Sepsis Labs Invalid input(s): PROCALCITONIN,  WBC,  LACTICIDVEN Microbiology Recent Results (from the past 240 hour(s))  Culture, blood (routine x 2)     Status: None   Collection Time: 11/28/17 12:46 AM  Result Value Ref Range Status   Specimen Description BLOOD RIGHT ANTECUBITAL  Final   Special Requests   Final    BOTTLES DRAWN AEROBIC AND ANAEROBIC Blood Culture adequate volume   Culture   Final    NO GROWTH 5 DAYS Performed at [facility], [address] [place], [place] [address]    Report Status 12/03/2017 FINAL  Final  Culture, blood (routine x 2)      Status: None   Collection Time: 11/28/17 12:46 AM  Result Value Ref Range Status   Specimen Description BLOOD RIGHT ANTECUBITAL  Final   Special Requests   Final    BOTTLES DRAWN AEROBIC AND ANAEROBIC Blood Culture adequate volume   Culture   Final    NO GROWTH 5 DAYS Performed at [facility], [place] [address], [place],  [address]    Report Status 12/03/2017 FINAL  Final  Time coordinating discharge: 90 minutes       SIGNED:   Edwin Dada, MD  [facility] 12/07/2017, 7:28 PM

## 2017-12-11 ENCOUNTER — Telehealth (INDEPENDENT_AMBULATORY_CARE_PROVIDER_SITE_OTHER): Payer: Self-pay | Admitting: Orthopedic Surgery

## 2017-12-11 ENCOUNTER — Inpatient Hospital Stay: Payer: Self-pay | Admitting: Family Medicine

## 2017-12-11 DIAGNOSIS — Z0289 Encounter for other administrative examinations: Secondary | ICD-10-CM

## 2017-12-11 NOTE — Telephone Encounter (Signed)
Estill Bamberg, [facility] nurse would like home health and wound care orders for this patient. She said patient did not have discharge summary with him, Her CB [phone]

## 2017-12-11 NOTE — Telephone Encounter (Signed)
Called and sw Estill Bamberg and advised that per the consult note from the hospital Dr. Sharol Given had orders profore dressings to be applied to BLE. Gave order for this and to have the pt call for follow up next week.

## 2017-12-12 ENCOUNTER — Telehealth: Payer: Self-pay | Admitting: *Deleted

## 2017-12-12 NOTE — Telephone Encounter (Signed)
Beth from [facility] called stating she went to the house and there were no wraps on him, he told her they cut them off at the hospital. She said his legs did look good, asking if she is to put them on, also continue silvadene on the great toe? [phone]

## 2017-12-12 NOTE — Telephone Encounter (Signed)
Refills needed. Pt seen in ED 11/27/17 and was advised these meds would be sent in but nothing was sent to the pharmacy. Pt is requesting to know if Dr [name] can fill these Rxs.   Copied from [place] [phone]. Topic: Quick Communication - See Telephone Encounter >> Dec 12, 2017 12:20 PM Neva Seat wrote: Joshua Massey w/ [facility] - [phone]  atorvastatin (LIPITOR) 10 MG tablet pantoprazole (PROTONIX) 40 MG tablet ferrous sulfate 325 (65 FE) MG tablet Vitamin D, Ergocalciferol, (DRISDOL) 50000 units CAPS capsule   [place], [place] - [address] [place] [address] Phone: [phone] Fax: [phone]

## 2017-12-13 ENCOUNTER — Telehealth (INDEPENDENT_AMBULATORY_CARE_PROVIDER_SITE_OTHER): Payer: Self-pay | Admitting: Orthopedic Surgery

## 2017-12-13 NOTE — Telephone Encounter (Signed)
Spoke with Estill Bamberg from [facility] she advised patient does not want to wear the wraps. Patient is now with the [facility] office but she will be happy to take message and forward to [place]. The number to contact Estill Bamberg is [phone]

## 2017-12-13 NOTE — Telephone Encounter (Signed)
Message sent to Dr. Jordan for review and approval. 

## 2017-12-15 ENCOUNTER — Other Ambulatory Visit: Payer: Self-pay | Admitting: *Deleted

## 2017-12-15 NOTE — Telephone Encounter (Signed)
He was in the hospital recently, diagnosed with acute renal failure. It is okay not to take these medications until we reevaluate during office visit.  In regard to toe care please follow recommendations given by Dr. Sharol Given.  Thanks, BJ

## 2017-12-15 NOTE — Telephone Encounter (Signed)
Can you please call this pt and make an appt for him to come in for an appt? He needs to see Dr. Sharol Given next week. He has never followed up from the hospital.

## 2017-12-15 NOTE — Telephone Encounter (Signed)
Called pt left VM to call back.  

## 2017-12-18 ENCOUNTER — Telehealth (HOSPITAL_COMMUNITY): Payer: Self-pay | Admitting: Surgery

## 2017-12-18 ENCOUNTER — Telehealth (HOSPITAL_COMMUNITY): Payer: Self-pay

## 2017-12-18 ENCOUNTER — Ambulatory Visit (INDEPENDENT_AMBULATORY_CARE_PROVIDER_SITE_OTHER): Payer: BLUE CROSS/BLUE SHIELD | Admitting: Family Medicine

## 2017-12-18 ENCOUNTER — Encounter: Payer: Self-pay | Admitting: Family Medicine

## 2017-12-18 VITALS — BP 126/84 | HR 86 | Temp 98.2°F | Resp 16 | Ht 71.0 in | Wt 274.5 lb

## 2017-12-18 DIAGNOSIS — Z91199 Patient's noncompliance with other medical treatment and regimen due to unspecified reason: Secondary | ICD-10-CM

## 2017-12-18 DIAGNOSIS — E1142 Type 2 diabetes mellitus with diabetic polyneuropathy: Secondary | ICD-10-CM

## 2017-12-18 DIAGNOSIS — E559 Vitamin D deficiency, unspecified: Secondary | ICD-10-CM | POA: Diagnosis not present

## 2017-12-18 DIAGNOSIS — I110 Hypertensive heart disease with heart failure: Secondary | ICD-10-CM

## 2017-12-18 DIAGNOSIS — N183 Chronic kidney disease, stage 3 unspecified: Secondary | ICD-10-CM

## 2017-12-18 DIAGNOSIS — Z9119 Patient's noncompliance with other medical treatment and regimen: Secondary | ICD-10-CM | POA: Insufficient documentation

## 2017-12-18 DIAGNOSIS — E785 Hyperlipidemia, unspecified: Secondary | ICD-10-CM

## 2017-12-18 DIAGNOSIS — E1159 Type 2 diabetes mellitus with other circulatory complications: Secondary | ICD-10-CM

## 2017-12-18 DIAGNOSIS — E1169 Type 2 diabetes mellitus with other specified complication: Secondary | ICD-10-CM

## 2017-12-18 MED ORDER — INSULIN GLARGINE 100 UNIT/ML ~~LOC~~ SOLN: 5.0000 [IU] | Freq: Every day | SUBCUTANEOUS | 1 refills | Status: DC

## 2017-12-18 MED ORDER — VITAMIN D (ERGOCALCIFEROL) 1.25 MG (50000 UNIT) PO CAPS: ORAL_CAPSULE | ORAL | 1 refills | Status: DC

## 2017-12-18 MED ORDER — FERROUS SULFATE 325 (65 FE) MG PO TABS: 325.0000 mg | ORAL_TABLET | Freq: Two times a day (BID) | ORAL | 3 refills | Status: DC

## 2017-12-18 MED ORDER — ATORVASTATIN CALCIUM 10 MG PO TABS: 10.0000 mg | ORAL_TABLET | Freq: Every day | ORAL | 1 refills | Status: DC

## 2017-12-18 NOTE — Assessment & Plan Note (Signed)
No changes on atorvastatin 10 mg daily. Low-fat diet also recommended. He is coming tomorrow for fasting labs, further recommendations will be given accordingly.

## 2017-12-18 NOTE — Assessment & Plan Note (Signed)
Adequate foot care discussed. He will keep appointment with Dr. Sharol Given for right great toe ulcer. Monitor for signs of infections.

## 2017-12-18 NOTE — Assessment & Plan Note (Signed)
Poorly controlled. No changes in current management. Regular exercise and healthy diet with avoidance of added sugar food intake is an important part of treatment and recommended. Strongly recommend arranging eye exam, we discussed possible ophthalmic complications of poorly controlled DM. He will continue following with endocrinologist, Dr. Loanne Drilling.

## 2017-12-18 NOTE — Assessment & Plan Note (Addendum)
Adequately controlled. No changes in current management. Low-salt diet to continue. Eye exam is needed. Today he is on SR. F/U in 3 months, before if needed.

## 2017-12-18 NOTE — Assessment & Plan Note (Signed)
We discussed the importance of keeping follow-up appointments and taking medication as recommended. He could not have labs today because his mother was involved in a MVA a few minutes ago.  He promised he will be back tomorrow. Strongly recommend keeping appointments, we reviewed dates.

## 2017-12-18 NOTE — Progress Notes (Signed)
HPI:   Joshua Massey is a 42 y.o. male, who is here today to follow on recent hospitalization. Not compliant,last OV 12/2016.   He was hospitalized from 11/27/17 to 12/07/17  DM II: He is following with [name], last visit a year ago.  BS's max FG 140, postprandial 115-130's.  He denies hypoglycemia. On Novolog sliding scale, 2-3 U daily. On Lantus 5 units at bedtime. Intermittent numbness sensations on feet.  Great toe ulcer, following with Dr. Sharol Given, next appt 01/09/18. [facility]. Doing wound care 1/week.   Denies abdominal pain, nausea,vomiting, polydipsia,polyuria, or polyphagia.   Lab Results  Component Value Date   HGBA1C 11.5 (H) 11/28/2017   CKD III:  He follows with nephrologist, appt 12/20/17. He has not noted form in urine, gross hematuria, or decreased urine output.  Lab Results  Component Value Date   CREATININE 3.35 (H) 12/07/2017   BUN 62 (H) 12/07/2017   NA 140 12/07/2017   K 4.5 12/07/2017   CL 95 (L) 12/07/2017   CO2 31 12/07/2017    Hyperlipidemia: Currently he is on atorvastatin 10 mg daily. He states that he is following a low-fat diet as well. He is tolerating medication well, denies side effects.  Lab Results  Component Value Date   CHOL 135 12/20/2016   HDL 41 12/20/2016   LDLCALC 76 12/20/2016   TRIG 90 12/20/2016   CHOLHDL 3.3 12/20/2016   Hypertension: Currently he is on BiDil 20-37.5 mg 3 times daily, carvedilol 25 mg twice daily Atrial fibrillation on amiodarone 200 mg daily and Eliquis 5 mg twice daily. She is not checking BP at home. He denies chest pain, palpitation, dyspnea, diaphoresis, orthopnea (sleeping on one small pillow), PND, or worsening lower extremity edema.  CHF, he is on torsemide 60 mg daily. He is waiting daily, weight at home after discharge 275 pounds, he has lost 5 pounds.  Weight has been 270, stable. He has appointment with cardiologist on 12/26/2017.    Review of Systems    Constitutional: Negative for activity change, appetite change, fatigue and fever.  HENT: Negative for mouth sores, nosebleeds and sore throat.   Eyes: Negative for redness and visual disturbance.  Respiratory: Negative for cough, shortness of breath and wheezing.   Cardiovascular: Positive for leg swelling. Negative for chest pain and palpitations.  Gastrointestinal: Negative for abdominal pain, nausea and vomiting.  Endocrine: Negative for cold intolerance, heat intolerance, polydipsia, polyphagia and polyuria.  Genitourinary: Negative for decreased urine volume, dysuria and hematuria.  Musculoskeletal: Negative for gait problem and myalgias.  Skin: Positive for wound. Negative for rash.  Neurological: Positive for numbness. Negative for syncope, weakness and headaches.      Current Outpatient Medications on File Prior to Visit  Medication Sig Dispense Refill  . acetaminophen (TYLENOL) 500 MG tablet Take 1,000 mg by mouth every 6 (six) hours as needed for moderate pain or headache.    Marland Kitchen amiodarone (PACERONE) 200 MG tablet Take 400 mg (2 tabs) twice daily for 1 week, then take 200 mg (1 tab) twice daily for 1 week, then take 200 mg (1 tab) daily. 60 tablet 0  . apixaban (ELIQUIS) 5 MG TABS tablet Take 1 tablet (5 mg total) by mouth 2 (two) times daily. 60 tablet 3  . carvedilol (COREG) 25 MG tablet Take 1 tablet (25 mg total) by mouth 2 (two) times daily with a meal. 60 tablet 3  . glucose blood (FREESTYLE TEST STRIPS) test strip Use  as instructed 100 each 0  . glucose monitoring kit (FREESTYLE) monitoring kit 1 each by Does not apply route 4 (four) times daily - after meals and at bedtime. 1 month Diabetic Testing Supplies for QAC-QHS accuchecks. 1 each 1  . insulin aspart (NOVOLOG FLEXPEN) 100 UNIT/ML FlexPen 0-15 Units, Subcutaneous, 3 times daily with meals CBG < 70: implement hypoglycemia protocol-call MD CBG 70 - 120: 0 units CBG 121 - 150: 2 units CBG 151 - 200: 3 units CBG 201 -  250: 5 units CBG 251 - 300: 8 units CBG 301 - 350: 11 units CBG 351 - 400: 15 units CBG > 400:Call MD 15 mL 0  . Insulin Pen Needle 32G X 8 MM MISC Use as directed 100 each 0  . isosorbide-hydrALAZINE (BIDIL) 20-37.5 MG tablet Take 1 tablet by mouth 3 (three) times daily. 90 tablet 3  . Lancets (FREESTYLE) lancets Use as instructed 100 each 0  . pantoprazole (PROTONIX) 40 MG tablet Take 1 tablet (40 mg total) by mouth daily before breakfast. 30 tablet 3  . potassium chloride SA (KLOR-CON M20) 20 MEQ tablet Take 2 tablets (40 mEq total) by mouth daily. 60 tablet 3  . torsemide (DEMADEX) 20 MG tablet Take 3 tablets (60 mg total) by mouth daily. 90 tablet 3  . atorvastatin (LIPITOR) 10 MG tablet Take 1 tablet (10 mg total) by mouth daily with supper. 90 tablet 1  . ferrous sulfate 325 (65 FE) MG tablet Take 1 tablet (325 mg total) by mouth 2 (two) times daily with a meal. 60 tablet 3  . Vitamin D, Ergocalciferol, (DRISDOL) 50000 units CAPS capsule Take 1 capsule by mouth once weekly for 8 weeks, and then every 2 weeks. 12 capsule 1   No current facility-administered medications on file prior to visit.      Past Medical History:  Diagnosis Date  . 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 Diabetes mellitus    adult-onset, type 2  . Exogenous obesity   . GERD (gastroesophageal reflux disease)   . Hypertension    No Known Allergies  Social History   Socioeconomic History  . Marital status: Single    Spouse name: Not on file  . Number of children: 6  . Years of education: 26  . Highest education level: Not on file  Occupational History  . Occupation: Brewing technologist    Comment: [name]  . Occupation: newpaper delivery    Employer: [employer]  Social Needs  . Financial resource strain: Not on file  . Food insecurity:    Worry: Not on file    Inability: Not on file  . Transportation needs:    Medical: Not on file    Non-medical:  Not on file  Tobacco Use  . Smoking status: Never Smoker  . Smokeless tobacco: Never Used  Substance and Sexual Activity  . Alcohol use: No  . Drug use: No  . Sexual activity: Yes    Birth control/protection: Condom  Lifestyle  . Physical activity:    Days per week: Not on file    Minutes per session: Not on file  . Stress: Not on file  Relationships  . Social connections:    Talks on phone: Not on file    Gets together: Not on file    Attends religious service: Not on file    Active member of club or organization: Not on file    Attends  meetings of clubs or organizations: Not on file    Relationship status: Not on file  Other Topics Concern  . Not on file  Social History Narrative  . Not on file    Vitals:   12/18/17 1611  BP: 126/84  Pulse: 86  Resp: 16  Temp: 98.2 F (36.8 C)  SpO2: 98%   Body mass index is 38.28 kg/m.   Physical Exam  Nursing note and vitals reviewed. Constitutional: He is oriented to person, place, and time. He appears well-developed. No distress.  HENT:  Head: Normocephalic and atraumatic.  Mouth/Throat: Oropharynx is clear and moist and mucous membranes are normal.  Eyes: Pupils are equal, round, and reactive to light. Conjunctivae are normal.  Neck: No JVD present.  Cardiovascular: Normal rate and regular rhythm.  No murmur heard. Pulses:      Dorsalis pedis pulses are 2+ on the right side, and 2+ on the left side.  Respiratory: Effort normal and breath sounds normal. No respiratory distress.  GI: Soft. He exhibits no mass. There is no hepatomegaly. There is no tenderness.  Musculoskeletal: He exhibits no edema.  Lymphadenopathy:    He has no cervical adenopathy.  Neurological: He is alert and oriented to person, place, and time. He has normal strength.  Skin: Skin is warm. No erythema.  Psychiatric: He has a normal mood and affect. Cognition and memory are normal.  Well groomed, good eye contact.    ASSESSMENT AND  PLAN:  Quillan was seen today for hospitalization follow-up.  Diagnoses and all orders for this visit:   Orders Placed This Encounter  Procedures  . Basic metabolic panel  . VITAMIN D 25 Hydroxy (Vit-D Deficiency, Fractures)  . Lipid panel    Diabetic peripheral neuropathy (HCC) Adequate foot care discussed. He will keep appointment with Dr. Sharol Given for right great toe ulcer. Monitor for signs of infections.   CKD (chronic kidney disease), stage III ([place]) He is currently following with nephrologist. Low-salt diet, avoid NSAIDs, and adequate hydration recommended. Adequate BP and DM 2 controlled. He is not on ACE inhibitor or ARB due to recent hospitalization with AKI. Continue BiDil.    Type 2 diabetes mellitus with vascular disease ([place]) Poorly controlled. No changes in current management. Regular exercise and healthy diet with avoidance of added sugar food intake is an important part of treatment and recommended. Strongly recommend arranging eye exam, we discussed possible ophthalmic complications of poorly controlled DM. He will continue following with endocrinologist, Dr. Loanne Drilling.    Hypertensive heart disease with heart failure (HCC) Adequately controlled. No changes in current management. Low-salt diet to continue. Eye exam is needed. Today he is on SR. F/U in 3 months, before if needed.   Hypovitaminosis D He will resume ergocalciferol 50,000 units, weekly for 8 weeks and then every 2 weeks. He is coming tomorrow for labs, further recommendations will be given according to 25 OH vitamin D. He will continue following with nephrologist.  Hyperlipidemia associated with type 2 diabetes mellitus (HCC) No changes on atorvastatin 10 mg daily. Low-fat diet also recommended. He is coming tomorrow for fasting labs, further recommendations will be given accordingly.  Non-compliant patient We discussed the importance of keeping follow-up appointments and taking  medication as recommended. He could not have labs today because his mother was involved in a MVA a few minutes ago.  He promised he will be back tomorrow. Strongly recommend keeping appointments, we reviewed dates.         Malka So  [name], [place]. [place] office.

## 2017-12-18 NOTE — Assessment & Plan Note (Signed)
He is currently following with nephrologist. Low-salt diet, avoid NSAIDs, and adequate hydration recommended. Adequate BP and DM 2 controlled. He is not on ACE inhibitor or ARB due to recent hospitalization with AKI. Continue BiDil.

## 2017-12-18 NOTE — Telephone Encounter (Signed)
Patient referred for the HF [facility] program.  All appropriate forms send to the [employer].

## 2017-12-18 NOTE — Patient Instructions (Addendum)
A few things to remember from today's visit:   Diabetic peripheral neuropathy ([place])  Type 2 diabetes mellitus with vascular disease ([place])  Hypertensive heart disease with heart failure ([place])  CKD (chronic kidney disease), stage III ([place])  Eye exam needed.   Tomorrow labs.   Please be sure medication list is accurate. If a new problem present, please set up appointment sooner than planned today.

## 2017-12-18 NOTE — Telephone Encounter (Signed)
Patient came into office on 12/18/17 for hospital follow-up to discuss medication and refills.

## 2017-12-18 NOTE — Assessment & Plan Note (Signed)
He will resume ergocalciferol 50,000 units, weekly for 8 weeks and then every 2 weeks. He is coming tomorrow for labs, further recommendations will be given according to 25 OH vitamin D. He will continue following with nephrologist.

## 2017-12-19 NOTE — Telephone Encounter (Signed)
I called Joshua Massey to schedule an initial appointment. He did not answer so I left a message stating my name and affiliation with the HF clinic an requested that he call me back.

## 2017-12-20 ENCOUNTER — Telehealth: Payer: Self-pay | Admitting: Family Medicine

## 2017-12-20 MED ORDER — INSULIN ASPART 100 UNIT/ML FLEXPEN: PEN_INJECTOR | SUBCUTANEOUS | 0 refills | Status: DC

## 2017-12-20 NOTE — Telephone Encounter (Signed)
Copied from [place] [id number]. Topic: Quick Communication - Rx Refill/Question >> Dec 20, 2017  9:13 AM Burchel, Abbi R wrote: Medication: insulin aspart (NOVOLOG FLEXPEN) 100 UNIT/ML FlexPen  Preferred Pharmacy: [place], [place] - [address]  [phone] (Phone) [phone] (Fax)   Pt: [phone]

## 2017-12-22 ENCOUNTER — Telehealth (HOSPITAL_COMMUNITY): Payer: Self-pay

## 2017-12-22 NOTE — Telephone Encounter (Signed)
I called Joshua Massey to schedule an appointment. He did not answer so I left my information and purpose for calling and requested that he call me back.

## 2017-12-26 ENCOUNTER — Encounter (HOSPITAL_COMMUNITY): Payer: Self-pay

## 2017-12-26 ENCOUNTER — Other Ambulatory Visit: Payer: Self-pay | Admitting: *Deleted

## 2017-12-26 MED ORDER — INSULIN ASPART 100 UNIT/ML FLEXPEN: PEN_INJECTOR | SUBCUTANEOUS | 0 refills | Status: DC

## 2017-12-26 NOTE — Telephone Encounter (Signed)
Rx faxed to pharmacy on 12/26/17 at 15:52 with fax confirmation.

## 2017-12-26 NOTE — Telephone Encounter (Signed)
Message sent to Dr. Jordan for review and approval. 

## 2017-12-26 NOTE — Telephone Encounter (Signed)
It is Ok to re-send Rx.  Thanks, BJ

## 2017-12-26 NOTE — Telephone Encounter (Signed)
PHARMACY DID NOT RECEIVE THE insulin aspart (NOVOLOG FLEXPEN) 100 UNIT/ML FlexPen SENT ON 12/20/2017. PLEASE RESEND.

## 2017-12-29 ENCOUNTER — Telehealth (HOSPITAL_COMMUNITY): Payer: Self-pay

## 2017-12-29 NOTE — Telephone Encounter (Signed)
I called Joshua Massey this morning to schedule an appointment. He did not answer so I left a voicemail with my name and contact information and requested he call me back.

## 2018-01-01 ENCOUNTER — Other Ambulatory Visit: Payer: Self-pay | Admitting: *Deleted

## 2018-01-01 ENCOUNTER — Telehealth: Payer: Self-pay

## 2018-01-01 NOTE — Telephone Encounter (Signed)
Copied from [place] [id number]. Topic: General - Other >> Jan 01, 2018  1:00 PM Carolyn Stare wrote: Pt call to ask if a RX can be sent to his pharmacy for [facility]

## 2018-01-01 NOTE — Telephone Encounter (Addendum)
Spoke with [facility], they have not received the Rx. Verified fax number, [phone] Noelle Penner, please advise if you still have this Rx so that it may be re-faxed to the pharmacy.  The patient is needing this filled asap. Thanks.

## 2018-01-01 NOTE — Telephone Encounter (Signed)
Spoke with pharmacy, stated that they filled the Rx that was sent on 12/26/17. Informed pharmacy that someone from here stated that they were told that Rx wasn't received and he stated I don't know who said that because we filled the one that was sent on the 9th. Re-faxed prescription with confirmation on 01/01/18 at 12:18 pm.

## 2018-01-01 NOTE — Telephone Encounter (Signed)
Left message for patient to give clinic a call back,unclear of what he is asking for.

## 2018-01-02 ENCOUNTER — Telehealth (HOSPITAL_COMMUNITY): Payer: Self-pay

## 2018-01-02 NOTE — Telephone Encounter (Signed)
Left message to give clinic a call back concerning refill request.

## 2018-01-03 NOTE — Telephone Encounter (Signed)
I called Joshua Massey to schedule an appointment. He did not answer so I left a voicemail with my information and requested he call me back. This hase been ongoing since 12/18/17. If I do not get a return call by the end of this week, I will contact the clinic and have him removed from paramedicine.

## 2018-01-05 ENCOUNTER — Other Ambulatory Visit: Payer: Self-pay

## 2018-01-05 DIAGNOSIS — E1159 Type 2 diabetes mellitus with other circulatory complications: Secondary | ICD-10-CM

## 2018-01-05 MED ORDER — INSULIN ASPART 100 UNIT/ML FLEXPEN: PEN_INJECTOR | SUBCUTANEOUS | 0 refills | Status: DC

## 2018-01-05 NOTE — Telephone Encounter (Signed)
Have tried calling patient several times, leaving messages to give clinic a call back concerning the [facility] and exactly what he wanted sent to his pharmacy.

## 2018-01-08 ENCOUNTER — Other Ambulatory Visit: Payer: Self-pay | Admitting: *Deleted

## 2018-01-08 ENCOUNTER — Telehealth: Payer: Self-pay | Admitting: *Deleted

## 2018-01-08 ENCOUNTER — Telehealth: Payer: Self-pay | Admitting: Family Medicine

## 2018-01-08 NOTE — Telephone Encounter (Signed)
Prior auth for Novolog Flexpen 100unit/ml inj sent to [email or web address]  and approval given.  [id number];WLTKCX:[id number];Review Type:Prior Auth;Coverage Start Date:12/09/2017;Coverage End Date:01/08/2019.  I called [facility] and informed Summer of this.

## 2018-01-08 NOTE — Telephone Encounter (Signed)
Pt called back about machine good call back number is listed below educated pt how important the phone call is.  [phone]

## 2018-01-08 NOTE — Telephone Encounter (Signed)
Copied from [place] [id number]. Topic: General - Other >> Jan 01, 2018  1:00 PM Carolyn Stare wrote: Pt call to ask if a RX can be sent to his pharmacy for [facility]

## 2018-01-09 ENCOUNTER — Telehealth (HOSPITAL_COMMUNITY): Payer: Self-pay

## 2018-01-09 ENCOUNTER — Inpatient Hospital Stay (INDEPENDENT_AMBULATORY_CARE_PROVIDER_SITE_OTHER): Payer: Self-pay | Admitting: Orthopedic Surgery

## 2018-01-09 NOTE — Telephone Encounter (Signed)
I called Joshua Massey to schedule an appointment. He did not answer so I left a voicemail requesting a call back. I have been unable to contact Joshua Massey since he was referred to the program on 12/18/17. I will recommend for discharge.

## 2018-01-10 MED ORDER — FREESTYLE LIBRE 14 DAY READER DEVI: 1.0000 | Freq: Every day | 5 refills | Status: DC

## 2018-01-10 MED ORDER — FREESTYLE LIBRE 14 DAY SENSOR MISC: 1.0000 | Freq: Every day | 5 refills | Status: DC

## 2018-01-10 NOTE — Telephone Encounter (Signed)
Rx sent to pharmacy as requested.

## 2018-01-10 NOTE — Telephone Encounter (Signed)
If pt has Dx of DM , I do not have to authorize testing supply,so please go ahead and refill Rx when it is requested.  Thanks, BJ

## 2018-01-11 ENCOUNTER — Encounter (HOSPITAL_COMMUNITY): Payer: Self-pay

## 2018-01-25 ENCOUNTER — Ambulatory Visit (INDEPENDENT_AMBULATORY_CARE_PROVIDER_SITE_OTHER): Payer: Self-pay | Admitting: Orthopedic Surgery

## 2018-01-31 ENCOUNTER — Encounter (HOSPITAL_COMMUNITY): Payer: Self-pay

## 2018-03-16 ENCOUNTER — Other Ambulatory Visit (HOSPITAL_COMMUNITY): Payer: Self-pay | Admitting: *Deleted

## 2018-03-19 ENCOUNTER — Telehealth (HOSPITAL_COMMUNITY): Payer: Self-pay | Admitting: Pharmacist

## 2018-03-19 NOTE — Telephone Encounter (Signed)
-----   Message from Harvie Junior, [place] sent at 03/16/2018  1:26 PM EDT ----- Pt has commercial insurance and has been paying zero-$30 for eliquis. This month it was $131ish he wants to know why the price change and if he can get assistance.  Thanks

## 2018-03-19 NOTE — Telephone Encounter (Signed)
Since patient has Pharmacist, community he is eligible to use the Eliquis $10 copay card. He can find this and download it online. I have left a VM with this information for him.   Ruta Hinds. Velva Harman, PharmD, BCPS, CPP Clinical Pharmacist Phone: [phone] 03/19/2018 9:26 AM

## 2018-03-20 ENCOUNTER — Telehealth (HOSPITAL_COMMUNITY): Payer: Self-pay

## 2018-03-20 NOTE — Telephone Encounter (Signed)
Medication Samples have been provided to the patient.  Drug name: Eliquis      Strength: 5 mg        Qty: 4  LOT: [id number]  Exp.Date: 11/21  Dosing instructions: Take 1 tablet (5 mg total) by mouth 2 (two) times daily.  The patient has been instructed regarding the correct time, dose, and frequency of taking this medication, including desired effects and most common side effects.   Vanice Sarah 2:33 PM 03/20/2018

## 2018-03-21 ENCOUNTER — Ambulatory Visit: Payer: BLUE CROSS/BLUE SHIELD | Admitting: Family Medicine

## 2018-03-21 DIAGNOSIS — Z0289 Encounter for other administrative examinations: Secondary | ICD-10-CM

## 2018-04-20 ENCOUNTER — Telehealth (HOSPITAL_COMMUNITY): Payer: Self-pay

## 2018-04-20 NOTE — Telephone Encounter (Signed)
Medication Samples have been provided to the patient.  Drug name: Eliquis       Strength: 5 mg        Qty: 4  LOT: [id number]  Exp.Date: 04/2020  Dosing instructions: Take 1 tablet (5 mg total) by mouth 2 (two) times daily.  The patient has been instructed regarding the correct time, dose, and frequency of taking this medication, including desired effects and most common side effects.   Vanice Sarah 11:48 AM 04/20/2018

## 2018-04-27 ENCOUNTER — Inpatient Hospital Stay (HOSPITAL_COMMUNITY): Admission: RE | Admit: 2018-04-27 | Payer: Self-pay | Source: Ambulatory Visit | Admitting: Internal Medicine

## 2018-05-21 ENCOUNTER — Other Ambulatory Visit: Payer: Self-pay | Admitting: Family Medicine

## 2018-06-08 ENCOUNTER — Ambulatory Visit: Payer: BLUE CROSS/BLUE SHIELD | Admitting: Family Medicine

## 2018-06-08 ENCOUNTER — Ambulatory Visit: Payer: Self-pay | Admitting: *Deleted

## 2018-06-08 DIAGNOSIS — Z0289 Encounter for other administrative examinations: Secondary | ICD-10-CM

## 2018-06-08 NOTE — Telephone Encounter (Signed)
Spoke with pt and he states he cannot go to UC until after 7pm when his wife gets home to take him. He does not want to go to ED. He does plan to go tonight to be evaluated. Pt advised to not delay and seek care as soon as possible. Pt voiced understanding.   Dr. [name] - FYI. Thanks!

## 2018-06-08 NOTE — Telephone Encounter (Signed)
Last follow-up visit was on 12/18/2016, he was supposed to arrange 3 follow-up appointment. Please advise patient to go to the ED to be evaluated. Thanks, BJ

## 2018-06-08 NOTE — Telephone Encounter (Signed)
Pt reports wound on bottom of left foot. States foot had minimal swelling Sunday night; "So I wrapped it with an ace bandage." States when he removed the ace Monday noted drainage, "White with a little bloody drainage." States open area, size of a dime with redness; odor "But less today." Does not recall any injury,denies fever. States swelling is worse, "Entire foot." Pt is diabetic. Pt had appt today but "Couldn't make it in time."  Pt directed to UC. Care advise given per protocol.  Reason for Disposition . [1] Looks infected (spreading redness, red streak, pus) AND [2] fever    No fever but with drainage, swelling and odor since Monday; pt is diabetic  Answer Assessment - Initial Assessment Questions 1. LOCATION: "Where is the wound located?"      Bottom of left foot 2. WOUND APPEARANCE: "What does the wound look like?"     Skin open, size of dime 3. SIZE: If redness is present, ask: "What is the size of the red area?" (Inches, centimeters, or compare to size of a coin)      Dime size 4. SPREAD: "What's changed in the last day?"  "Do you see any red streaks coming from the wound?"     Drainage, odor, increased swelling 5. ONSET: "When did it start to look infected?"      Monday 6. MECHANISM: "How did the wound start, what was the cause?"     Unsure, no injury 7. PAIN: "Is there any pain?" If so, ask: "How bad is the pain?"   (Scale 1-10; or mild, moderate, severe)     "Little" 8. FEVER: "Do you have a fever?" If so, ask: "What is your temperature, how was it measured, and when did it start?"     no 9. OTHER SYMPTOMS: "Do you have any other symptoms?" (e.g., shaking chills, weakness, rash elsewhere on body)     no  Protocols used: WOUND INFECTION-A-AH

## 2018-06-09 ENCOUNTER — Inpatient Hospital Stay (HOSPITAL_COMMUNITY)
Admission: EM | Admit: 2018-06-09 | Discharge: 2018-06-14 | DRG: 264 | Disposition: A | Discharge status: Home / Self Care | Payer: BLUE CROSS/BLUE SHIELD | Source: Ambulatory Visit | Attending: Internal Medicine | Admitting: Internal Medicine

## 2018-06-09 ENCOUNTER — Ambulatory Visit (HOSPITAL_COMMUNITY)
Admission: EM | Admit: 2018-06-09 | Discharge: 2018-06-09 | Disposition: A | Discharge status: Home / Self Care | Payer: BLUE CROSS/BLUE SHIELD | Attending: Family Medicine | Admitting: Family Medicine

## 2018-06-09 ENCOUNTER — Encounter (HOSPITAL_COMMUNITY): Payer: Self-pay

## 2018-06-09 ENCOUNTER — Emergency Department (HOSPITAL_COMMUNITY): Payer: BLUE CROSS/BLUE SHIELD

## 2018-06-09 DIAGNOSIS — Z833 Family history of diabetes mellitus: Secondary | ICD-10-CM

## 2018-06-09 DIAGNOSIS — N189 Chronic kidney disease, unspecified: Secondary | ICD-10-CM

## 2018-06-09 DIAGNOSIS — L03119 Cellulitis of unspecified part of limb: Secondary | ICD-10-CM | POA: Diagnosis not present

## 2018-06-09 DIAGNOSIS — I4892 Unspecified atrial flutter: Secondary | ICD-10-CM | POA: Diagnosis present

## 2018-06-09 DIAGNOSIS — E11621 Type 2 diabetes mellitus with foot ulcer: Secondary | ICD-10-CM | POA: Diagnosis not present

## 2018-06-09 DIAGNOSIS — L97519 Non-pressure chronic ulcer of other part of right foot with unspecified severity: Secondary | ICD-10-CM | POA: Diagnosis present

## 2018-06-09 DIAGNOSIS — I428 Other cardiomyopathies: Secondary | ICD-10-CM

## 2018-06-09 DIAGNOSIS — N179 Acute kidney failure, unspecified: Secondary | ICD-10-CM | POA: Diagnosis present

## 2018-06-09 DIAGNOSIS — B964 Proteus (mirabilis) (morganii) as the cause of diseases classified elsewhere: Secondary | ICD-10-CM | POA: Diagnosis present

## 2018-06-09 DIAGNOSIS — Z23 Encounter for immunization: Secondary | ICD-10-CM | POA: Diagnosis not present

## 2018-06-09 DIAGNOSIS — L02612 Cutaneous abscess of left foot: Secondary | ICD-10-CM | POA: Diagnosis present

## 2018-06-09 DIAGNOSIS — N184 Chronic kidney disease, stage 4 (severe): Secondary | ICD-10-CM | POA: Diagnosis present

## 2018-06-09 DIAGNOSIS — Z8349 Family history of other endocrine, nutritional and metabolic diseases: Secondary | ICD-10-CM

## 2018-06-09 DIAGNOSIS — E1152 Type 2 diabetes mellitus with diabetic peripheral angiopathy with gangrene: Principal | ICD-10-CM | POA: Diagnosis present

## 2018-06-09 DIAGNOSIS — E11628 Type 2 diabetes mellitus with other skin complications: Secondary | ICD-10-CM | POA: Diagnosis not present

## 2018-06-09 DIAGNOSIS — Z9119 Patient's noncompliance with other medical treatment and regimen: Secondary | ICD-10-CM

## 2018-06-09 DIAGNOSIS — E1122 Type 2 diabetes mellitus with diabetic chronic kidney disease: Secondary | ICD-10-CM | POA: Diagnosis present

## 2018-06-09 DIAGNOSIS — E1151 Type 2 diabetes mellitus with diabetic peripheral angiopathy without gangrene: Secondary | ICD-10-CM

## 2018-06-09 DIAGNOSIS — K219 Gastro-esophageal reflux disease without esophagitis: Secondary | ICD-10-CM | POA: Diagnosis present

## 2018-06-09 DIAGNOSIS — L03116 Cellulitis of left lower limb: Secondary | ICD-10-CM | POA: Diagnosis present

## 2018-06-09 DIAGNOSIS — E11610 Type 2 diabetes mellitus with diabetic neuropathic arthropathy: Secondary | ICD-10-CM | POA: Diagnosis present

## 2018-06-09 DIAGNOSIS — I96 Gangrene, not elsewhere classified: Secondary | ICD-10-CM

## 2018-06-09 DIAGNOSIS — D649 Anemia, unspecified: Secondary | ICD-10-CM | POA: Diagnosis present

## 2018-06-09 DIAGNOSIS — I87333 Chronic venous hypertension (idiopathic) with ulcer and inflammation of bilateral lower extremity: Secondary | ICD-10-CM | POA: Diagnosis present

## 2018-06-09 DIAGNOSIS — E785 Hyperlipidemia, unspecified: Secondary | ICD-10-CM | POA: Diagnosis present

## 2018-06-09 DIAGNOSIS — I2510 Atherosclerotic heart disease of native coronary artery without angina pectoris: Secondary | ICD-10-CM | POA: Diagnosis present

## 2018-06-09 DIAGNOSIS — L97401 Non-pressure chronic ulcer of unspecified heel and midfoot limited to breakdown of skin: Secondary | ICD-10-CM

## 2018-06-09 DIAGNOSIS — L97429 Non-pressure chronic ulcer of left heel and midfoot with unspecified severity: Secondary | ICD-10-CM | POA: Diagnosis present

## 2018-06-09 DIAGNOSIS — Z6841 Body Mass Index (BMI) 40.0 and over, adult: Secondary | ICD-10-CM | POA: Diagnosis not present

## 2018-06-09 DIAGNOSIS — Z79899 Other long term (current) drug therapy: Secondary | ICD-10-CM

## 2018-06-09 DIAGNOSIS — I130 Hypertensive heart and chronic kidney disease with heart failure and stage 1 through stage 4 chronic kidney disease, or unspecified chronic kidney disease: Secondary | ICD-10-CM | POA: Diagnosis present

## 2018-06-09 DIAGNOSIS — Z7901 Long term (current) use of anticoagulants: Secondary | ICD-10-CM

## 2018-06-09 DIAGNOSIS — E1165 Type 2 diabetes mellitus with hyperglycemia: Secondary | ICD-10-CM | POA: Diagnosis present

## 2018-06-09 DIAGNOSIS — Z91199 Patient's noncompliance with other medical treatment and regimen due to unspecified reason: Secondary | ICD-10-CM

## 2018-06-09 DIAGNOSIS — E08621 Diabetes mellitus due to underlying condition with foot ulcer: Secondary | ICD-10-CM

## 2018-06-09 DIAGNOSIS — Z9114 Patient's other noncompliance with medication regimen: Secondary | ICD-10-CM

## 2018-06-09 DIAGNOSIS — Z794 Long term (current) use of insulin: Secondary | ICD-10-CM

## 2018-06-09 DIAGNOSIS — I5042 Chronic combined systolic (congestive) and diastolic (congestive) heart failure: Secondary | ICD-10-CM | POA: Diagnosis present

## 2018-06-09 DIAGNOSIS — L97529 Non-pressure chronic ulcer of other part of left foot with unspecified severity: Secondary | ICD-10-CM | POA: Diagnosis present

## 2018-06-09 DIAGNOSIS — E1169 Type 2 diabetes mellitus with other specified complication: Secondary | ICD-10-CM | POA: Diagnosis present

## 2018-06-09 DIAGNOSIS — E669 Obesity, unspecified: Secondary | ICD-10-CM | POA: Diagnosis present

## 2018-06-09 DIAGNOSIS — E1159 Type 2 diabetes mellitus with other circulatory complications: Secondary | ICD-10-CM | POA: Diagnosis present

## 2018-06-09 DIAGNOSIS — Z8249 Family history of ischemic heart disease and other diseases of the circulatory system: Secondary | ICD-10-CM

## 2018-06-09 DIAGNOSIS — L97509 Non-pressure chronic ulcer of other part of unspecified foot with unspecified severity: Secondary | ICD-10-CM

## 2018-06-09 DIAGNOSIS — Z8781 Personal history of (healed) traumatic fracture: Secondary | ICD-10-CM

## 2018-06-09 LAB — GLUCOSE, CAPILLARY: GLUCOSE-CAPILLARY: 122 mg/dL — AB (ref 70–99)

## 2018-06-09 LAB — CBC
HEMATOCRIT: 29.9 % — AB (ref 39.0–52.0)
HEMOGLOBIN: 9.1 g/dL — AB (ref 13.0–17.0)
MCH: 27.6 pg (ref 26.0–34.0)
MCHC: 30.4 g/dL (ref 30.0–36.0)
MCV: 90.6 fL (ref 80.0–100.0)
Platelets: 466 10*3/uL — ABNORMAL HIGH (ref 150–400)
RBC: 3.3 MIL/uL — AB (ref 4.22–5.81)
RDW: 16.4 % — ABNORMAL HIGH (ref 11.5–15.5)
WBC: 9.9 10*3/uL (ref 4.0–10.5)
nRBC: 0.2 % (ref 0.0–0.2)

## 2018-06-09 LAB — I-STAT CHEM 8, ED
BUN: 77 mg/dL — ABNORMAL HIGH (ref 6–20)
CHLORIDE: 111 mmol/L (ref 98–111)
CREATININE: 4.4 mg/dL — AB (ref 0.61–1.24)
Calcium, Ion: 1.14 mmol/L — ABNORMAL LOW (ref 1.15–1.40)
GLUCOSE: 181 mg/dL — AB (ref 70–99)
HEMATOCRIT: 31 % — AB (ref 39.0–52.0)
Hemoglobin: 10.5 g/dL — ABNORMAL LOW (ref 13.0–17.0)
POTASSIUM: 5.1 mmol/L (ref 3.5–5.1)
Sodium: 140 mmol/L (ref 135–145)
TCO2: 20 mmol/L — ABNORMAL LOW (ref 22–32)

## 2018-06-09 MED ORDER — PIPERACILLIN-TAZOBACTAM 3.375 G IVPB
3.3750 g | Freq: Three times a day (TID) | INTRAVENOUS | Status: DC
  Administered 2018-06-09 – 2018-06-12 (×7): 3.375 g via INTRAVENOUS
  Filled 2018-06-09 (×10): qty 50

## 2018-06-09 MED ORDER — AMIODARONE HCL 100 MG PO TABS
200.0000 mg | ORAL_TABLET | Freq: Two times a day (BID) | ORAL | Status: DC
  Administered 2018-06-09 – 2018-06-14 (×10): 200 mg via ORAL
  Filled 2018-06-09 (×10): qty 2

## 2018-06-09 MED ORDER — MORPHINE SULFATE (PF) 2 MG/ML IV SOLN
2.0000 mg | INTRAVENOUS | Status: DC | PRN
  Administered 2018-06-10 – 2018-06-11 (×4): 2 mg via INTRAVENOUS
  Filled 2018-06-09 (×4): qty 1

## 2018-06-09 MED ORDER — TORSEMIDE 20 MG PO TABS
60.0000 mg | ORAL_TABLET | Freq: Every day | ORAL | Status: DC
  Administered 2018-06-10 – 2018-06-14 (×5): 60 mg via ORAL
  Filled 2018-06-09 (×5): qty 3

## 2018-06-09 MED ORDER — INSULIN ASPART 100 UNIT/ML ~~LOC~~ SOLN: 0.0000 [IU] | Freq: Every day | SUBCUTANEOUS | Status: DC

## 2018-06-09 MED ORDER — VANCOMYCIN HCL 10 G IV SOLR
2000.0000 mg | Freq: Once | INTRAVENOUS | Status: AC
  Administered 2018-06-09: 2000 mg via INTRAVENOUS
  Filled 2018-06-09: qty 2000

## 2018-06-09 MED ORDER — HEPARIN SODIUM (PORCINE) 5000 UNIT/ML IJ SOLN: 5000.0000 [IU] | Freq: Three times a day (TID) | INTRAMUSCULAR | Status: DC

## 2018-06-09 MED ORDER — ATORVASTATIN CALCIUM 10 MG PO TABS
10.0000 mg | ORAL_TABLET | Freq: Every day | ORAL | Status: DC
  Administered 2018-06-11 – 2018-06-13 (×3): 10 mg via ORAL
  Filled 2018-06-09 (×3): qty 1

## 2018-06-09 MED ORDER — ISOSORB DINITRATE-HYDRALAZINE 20-37.5 MG PO TABS
1.0000 | ORAL_TABLET | Freq: Three times a day (TID) | ORAL | Status: DC
  Administered 2018-06-10 – 2018-06-14 (×13): 1 via ORAL
  Filled 2018-06-09 (×16): qty 1

## 2018-06-09 MED ORDER — VANCOMYCIN VARIABLE DOSE PER UNSTABLE RENAL FUNCTION (PHARMACIST DOSING): Status: DC

## 2018-06-09 MED ORDER — TETANUS-DIPHTH-ACELL PERTUSSIS 5-2.5-18.5 LF-MCG/0.5 IM SUSP
0.5000 mL | Freq: Once | INTRAMUSCULAR | Status: AC
  Administered 2018-06-09: 0.5 mL via INTRAMUSCULAR
  Filled 2018-06-09: qty 0.5

## 2018-06-09 MED ORDER — PANTOPRAZOLE SODIUM 40 MG PO TBEC
40.0000 mg | DELAYED_RELEASE_TABLET | Freq: Every day | ORAL | Status: DC
  Administered 2018-06-10 – 2018-06-14 (×5): 40 mg via ORAL
  Filled 2018-06-09 (×5): qty 1

## 2018-06-09 MED ORDER — SODIUM CHLORIDE 0.9 % IV SOLN
INTRAVENOUS | Status: DC
  Administered 2018-06-09 – 2018-06-10 (×2): via INTRAVENOUS

## 2018-06-09 MED ORDER — PIPERACILLIN-TAZOBACTAM 3.375 G IVPB
3.3750 g | Freq: Three times a day (TID) | INTRAVENOUS | Status: DC
  Filled 2018-06-09 (×2): qty 50

## 2018-06-09 MED ORDER — POTASSIUM CHLORIDE CRYS ER 20 MEQ PO TBCR
40.0000 meq | EXTENDED_RELEASE_TABLET | Freq: Every day | ORAL | Status: DC
  Administered 2018-06-10: 40 meq via ORAL
  Filled 2018-06-09: qty 2

## 2018-06-09 MED ORDER — FREESTYLE SYSTEM KIT: 1.0000 | PACK | Freq: Three times a day (TID) | Status: DC

## 2018-06-09 MED ORDER — FERROUS SULFATE 325 (65 FE) MG PO TABS
325.0000 mg | ORAL_TABLET | Freq: Two times a day (BID) | ORAL | Status: DC
  Administered 2018-06-10 – 2018-06-14 (×8): 325 mg via ORAL
  Filled 2018-06-09 (×8): qty 1

## 2018-06-09 MED ORDER — INSULIN ASPART 100 UNIT/ML ~~LOC~~ SOLN
0.0000 [IU] | Freq: Three times a day (TID) | SUBCUTANEOUS | Status: DC
  Administered 2018-06-10 – 2018-06-11 (×4): 3 [IU] via SUBCUTANEOUS
  Administered 2018-06-12: 4 [IU] via SUBCUTANEOUS
  Administered 2018-06-12: 3 [IU] via SUBCUTANEOUS
  Administered 2018-06-13 (×3): 4 [IU] via SUBCUTANEOUS
  Administered 2018-06-14 (×2): 3 [IU] via SUBCUTANEOUS

## 2018-06-09 MED ORDER — APIXABAN 5 MG PO TABS
5.0000 mg | ORAL_TABLET | Freq: Two times a day (BID) | ORAL | Status: DC
  Administered 2018-06-09: 5 mg via ORAL
  Filled 2018-06-09: qty 1

## 2018-06-09 MED ORDER — AMOXICILLIN-POT CLAVULANATE 500-125 MG PO TABS
1.0000 | ORAL_TABLET | Freq: Two times a day (BID) | ORAL | Status: DC
  Filled 2018-06-09: qty 1

## 2018-06-09 MED ORDER — ACETAMINOPHEN 500 MG PO TABS
1000.0000 mg | ORAL_TABLET | Freq: Four times a day (QID) | ORAL | Status: DC | PRN
  Administered 2018-06-10 – 2018-06-12 (×2): 1000 mg via ORAL
  Filled 2018-06-09 (×3): qty 2

## 2018-06-09 MED ORDER — PIPERACILLIN-TAZOBACTAM 3.375 G IVPB
3.3750 g | Freq: Two times a day (BID) | INTRAVENOUS | Status: DC
  Filled 2018-06-09: qty 50

## 2018-06-09 MED ORDER — CARVEDILOL 25 MG PO TABS
25.0000 mg | ORAL_TABLET | Freq: Two times a day (BID) | ORAL | Status: DC
  Administered 2018-06-10 – 2018-06-14 (×8): 25 mg via ORAL
  Filled 2018-06-09 (×8): qty 1

## 2018-06-09 MED ORDER — INSULIN GLARGINE 100 UNIT/ML ~~LOC~~ SOLN
5.0000 [IU] | Freq: Every day | SUBCUTANEOUS | Status: DC
  Administered 2018-06-09 – 2018-06-13 (×5): 5 [IU] via SUBCUTANEOUS
  Filled 2018-06-09 (×7): qty 0.05

## 2018-06-09 MED ORDER — CEFAZOLIN SODIUM-DEXTROSE 1-4 GM/50ML-% IV SOLN: 1.0000 g | Freq: Once | INTRAVENOUS | Status: DC

## 2018-06-09 NOTE — ED Notes (Signed)
IV team at bedside 

## 2018-06-09 NOTE — Progress Notes (Signed)
  Pharmacy Antibiotic Note  Joshua Massey is a 42 y.o. male admitted on 06/09/2018 with cellulitis  Pharmacy has been consulted for Vancomycin and Zosyn dosing. Patient with elevated Scr of 4.4 (BL 3.21 in June of 2019). Given renal function, will give a load of vancomycin and will dose further doses based on levels.    Plan: Vancomycin 2000mg  IV x 1 Zosyn 3.375gm IV q8h Monitor clinical progress/ LOT and vanc levels as needed.        Temp (24hrs), Avg:97.7 F (36.5 C), Min:97.4 F (36.3 C), Max:98.1 F (36.7 C)  Recent Labs  Lab 06/09/18 1748 06/09/18 1756  WBC 9.9  --   CREATININE  --  4.40*    CrCl cannot be calculated (Unknown ideal weight.).    No Known Allergies   Joshua Massey A. Levada Dy, PharmD, [place] Pager: [phone] Please utilize Amion for appropriate phone number to reach the unit pharmacist ([place])   06/09/2018 8:36 PM

## 2018-06-09 NOTE — H&P (Signed)
History and Physical   Joshua Massey QVZ:[id number] DOB: [birth year] DOA: 06/09/2018  Referring MD/NP/PA: Dr. Winfred Leeds  PCP: [name], [name], MD   Patient coming from: Home  Chief Complaint: Left foot swelling  HPI: Joshua Massey is a 42 y.o. male with medical history significant of insulin-dependent diabetes but noncompliant with treatment, nonischemic cardiomyopathy with EF of 20%, chronic kidney disease stage III, GERD, hypertension, morbid obesity who apparently noted peeling of the skin of his left foot at the plantar aspect about 5 days ago.  Patient place a Band-Aid over there but when he took it off 2 days ago the whole skin came off.  There was drainage on the bandage.  Went to urgent care center where he was evaluated and found to have significant ulcer of the left foot.  He broke the same foot couple of years ago but that has healed.  He has significant diabetic neuropathy with poor sensation.  Patient has coronary artery disease with previous LAD lesion up to 50%.  He has peripheral vascular disease but no documented ABIs.  In the ER he has bilateral gangrenous lesions of his feet.  On the left it is the whole forefoot at the plantar surface on the right it affects the big toe.  There is also corresponding erythema and edema of both feet.  Suspected cellulitis with possible revision dry gangrene.  Orthopedics was consulted and patient is being admitted for treatment..  ED Course: Temperature is 97 for blood pressure 144/95 pulse 81 respiratory of 18 oxygen sats 97% room air.  White count is 9.9 hemoglobin 10.5 and platelet 466.  Sodium is 140 potassium 5.1 chloride 111 BUN is 77 creatinine 4.40 and glucose 181.  Previous creatinine was 3.5.  X-ray of the left foot showed deformity and bony destructive changes in the level of the TMT joints.  Ununited fracture with bone resorptive changes in the second middle phalanx and diffuse soft tissue swelling and irregularity or lucencies of  the distal plantar surface consistent with ulcers.  Patient initiated on IV antibiotics and admitted to the hospital.  Orthopedics also consulted  Review of Systems: As per HPI otherwise 10 point review of systems negative.    Past Medical History:  Diagnosis Date  . 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 Diabetes mellitus    adult-onset, type 2  . Exogenous obesity   . GERD (gastroesophageal reflux disease)   . Hypertension     Past Surgical History:  Procedure Laterality Date  . ANKLE SURGERY  2003   plate and screws  . CARDIAC CATHETERIZATION  02/16/2012   50% LAD lesion in mid vessel, otherwise no significant obstructive CAD  . HIP PINNING  1990   bilateral  . IR GENERIC HISTORICAL  09/01/2016   IR FLUORO GUIDE CV LINE RIGHT 09/01/2016 Marybelle Killings, MD [facility]  . IR GENERIC HISTORICAL  09/01/2016   IR US GUIDE VASC ACCESS RIGHT 09/01/2016 Marybelle Killings, MD [facility]  . IR GENERIC HISTORICAL  09/16/2016   IR REMOVAL TUN CV CATH W/O FL 09/16/2016 Arne Cleveland, MD [facility]  . KNEE ARTHROSCOPY Left 08/30/2016   Procedure: ARTHROSCOPY KNEE;  Surgeon: Renette Butters, MD;  Location: [facility];  Service: Orthopedics;  Laterality: Left;  . LEFT AND RIGHT HEART CATHETERIZATION WITH CORONARY ANGIOGRAM N/A 02/16/2012   Procedure: LEFT AND RIGHT HEART CATHETERIZATION WITH CORONARY ANGIOGRAM;  Surgeon: Pixie Casino, MD;  Location: [facility] CATH LAB;  Service:  Cardiovascular;  Laterality: N/A;  . TRANSTHORACIC ECHOCARDIOGRAM  08/31/2012   EF 35-40%, no significant wall motion abnormalities, diastolic relaxation abnormality - no longer needed LifeVest; LV cavity size mod dilated with mod conc hypertrophy, systolic function mod reduced; mild MR, LA mod dilated     reports that he has never smoked. He has never used smokeless tobacco. He reports that he does not drink alcohol or use drugs.  No Known Allergies  Family History  Problem Relation Age of Onset  .  Coronary artery disease Brother        CABG at 87, DM  . Heart disease Brother 72       CAD  . Diabetes Mother   . Hypertension Mother   . Hyperlipidemia Mother   . Heart disease Mother   . Diabetes Father   . Hypertension Father      Prior to Admission medications   Medication Sig Start Date End Date Taking? Authorizing Provider  acetaminophen (TYLENOL) 500 MG tablet Take 1,000 mg by mouth every 6 (six) hours as needed for moderate pain or headache.    [provider]  amiodarone (PACERONE) 200 MG tablet Take 400 mg (2 tabs) twice daily for 1 week, then take 200 mg (1 tab) twice daily for 1 week, then take 200 mg (1 tab) daily. 12/07/17   Danford, Suann Larry, MD  apixaban (ELIQUIS) 5 MG TABS tablet Take 1 tablet (5 mg total) by mouth 2 (two) times daily. 12/07/17   Danford, Suann Larry, MD  atorvastatin (LIPITOR) 10 MG tablet Take 1 tablet (10 mg total) by mouth daily with supper. 12/18/17   [name], [name], MD  carvedilol (COREG) 25 MG tablet Take 1 tablet (25 mg total) by mouth 2 (two) times daily with a meal. 12/08/17   Danford, Suann Larry, MD  Continuous Blood Gluc Receiver (FREESTYLE LIBRE 14 DAY READER) DEVI 1 Device by Does not apply route daily. 01/10/18   [name], [name], MD  Continuous Blood Gluc Sensor (FREESTYLE LIBRE 14 DAY SENSOR) MISC 1 Device by Does not apply route daily. 01/10/18   [name], [name], MD  Ferrous Sulfate (IRON) 325 (65 Fe) MG TABS TAKE 1 TABLET BY MOUTH TWICE DAILY WITH MEALS 05/21/18   [name], [name], MD  glucose blood (FREESTYLE TEST STRIPS) test strip Use as instructed 09/02/16   Ghimire, Henreitta Leber, MD  glucose monitoring kit (FREESTYLE) monitoring kit 1 each by Does not apply route 4 (four) times daily - after meals and at bedtime. 1 month Diabetic Testing Supplies for QAC-QHS accuchecks. 09/02/16   Ghimire, Henreitta Leber, MD  insulin aspart (NOVOLOG FLEXPEN) 100 UNIT/ML FlexPen  0-15U/Subcu/TIDw/meals//CBG70-120:0U/CBG121-150:2U/CBG151-200:3U/CBG201-250:5U/CBG251-300:8U/CBG301-350:11U/CBG351-400:15U//CBG>400:Call MD 01/05/18   [name], [name], MD  insulin glargine (LANTUS) 100 UNIT/ML injection Inject 0.05 mLs (5 Units total) into the skin at bedtime. 12/18/17   [name], [name], MD  Insulin Pen Needle 32G X 8 MM MISC Use as directed 09/02/16   Ghimire, Henreitta Leber, MD  isosorbide-hydrALAZINE (BIDIL) 20-37.5 MG tablet Take 1 tablet by mouth 3 (three) times daily. 12/06/17   Edwin Dada, MD  Lancets (FREESTYLE) lancets Use as instructed 09/02/16   Jonetta Osgood, MD  pantoprazole (PROTONIX) 40 MG tablet Take 1 tablet (40 mg total) by mouth daily before breakfast. 11/02/16   [name], [name], MD  potassium chloride SA (KLOR-CON M20) 20 MEQ tablet Take 2 tablets (40 mEq total) by mouth daily. 12/06/17   Edwin Dada, MD  torsemide (DEMADEX) 20 MG tablet  Take 3 tablets (60 mg total) by mouth daily. 12/07/17   Danford, Suann Larry, MD  Vitamin D, Ergocalciferol, (DRISDOL) 50000 units CAPS capsule Take 1 capsule by mouth once weekly for 8 weeks, and then every 2 weeks. 12/18/17   [name], [name], MD    Physical Exam: Vitals:   06/09/18 1723 06/09/18 2003  BP: (!) 145/95 (!) 129/98  Pulse: 80 75  Resp: 18   Temp: (!) 97.4 F (36.3 C) 97.6 F (36.4 C)  TempSrc: Oral Oral  SpO2: 100% 100%      Constitutional: NAD, calm, comfortable Vitals:   06/09/18 1723 06/09/18 2003  BP: (!) 145/95 (!) 129/98  Pulse: 80 75  Resp: 18   Temp: (!) 97.4 F (36.3 C) 97.6 F (36.4 C)  TempSrc: Oral Oral  SpO2: 100% 100%   Eyes: PERRL, lids and conjunctivae normal ENMT: Mucous membranes are moist. Posterior pharynx clear of any exudate or lesions.Normal dentition.  Neck: normal, supple, no masses, no thyromegaly Respiratory: clear to auscultation bilaterally, no wheezing, no crackles. Normal respiratory effort. No accessory muscle use.  Cardiovascular: Regular rate  and rhythm, no murmurs / rubs / gallops. No extremity edema. 2+ pedal pulses. No carotid bruits.  Abdomen: no tenderness, no masses palpated. No hepatosplenomegaly. Bowel sounds positive.  Musculoskeletal: Bilateral feet swollen.  Left especially with open ulcer draining serosanguineous fluids.  Foul-smelling Skin: no rashes, lesions, ulcers. No induration Neurologic: CN 2-12 grossly intact. Sensation intact, DTR normal. Strength 5/5 in all 4.  Psychiatric: Normal judgment and insight. Alert and oriented x 3. Normal mood.     Labs on Admission: I have personally reviewed following labs and imaging studies  CBC: Recent Labs  Lab 06/09/18 1748 06/09/18 1756  WBC 9.9  --   HGB 9.1* 10.5*  HCT 29.9* 31.0*  MCV 90.6  --   PLT 466*  --    Basic Metabolic Panel: Recent Labs  Lab 06/09/18 1756  NA 140  K 5.1  CL 111  GLUCOSE 181*  BUN 77*  CREATININE 4.40*   GFR: CrCl cannot be calculated (Unknown ideal weight.). Liver Function Tests: No results for input(s): AST, ALT, ALKPHOS, BILITOT, PROT, ALBUMIN in the last 168 hours. No results for input(s): LIPASE, AMYLASE in the last 168 hours. No results for input(s): AMMONIA in the last 168 hours. Coagulation Profile: No results for input(s): INR, PROTIME in the last 168 hours. Cardiac Enzymes: No results for input(s): CKTOTAL, CKMB, CKMBINDEX, TROPONINI in the last 168 hours. BNP (last 3 results) No results for input(s): PROBNP in the last 8760 hours. HbA1C: No results for input(s): HGBA1C in the last 72 hours. CBG: No results for input(s): GLUCAP in the last 168 hours. Lipid Profile: No results for input(s): CHOL, HDL, LDLCALC, TRIG, CHOLHDL, LDLDIRECT in the last 72 hours. Thyroid Function Tests: No results for input(s): TSH, T4TOTAL, FREET4, T3FREE, THYROIDAB in the last 72 hours. Anemia Panel: No results for input(s): VITAMINB12, FOLATE, FERRITIN, TIBC, IRON, RETICCTPCT in the last 72 hours. Urine analysis:    Component  Value Date/Time   COLORURINE AMBER (A) 08/17/2016 0129   APPEARANCEUR HAZY (A) 08/17/2016 0129   LABSPEC 1.016 08/17/2016 0129   PHURINE 5.0 08/17/2016 0129   GLUCOSEU 50 (A) 08/17/2016 0129   HGBUR SMALL (A) 08/17/2016 0129   BILIRUBINUR NEGATIVE 08/17/2016 0129   KETONESUR NEGATIVE 08/17/2016 0129   PROTEINUR >=300 (A) 08/17/2016 0129   UROBILINOGEN 0.2 09/02/2009 0935   NITRITE NEGATIVE 08/17/2016 0129   LEUKOCYTESUR NEGATIVE  08/17/2016 0129   Sepsis Labs: @LABRCNTIP (procalcitonin:4,lacticidven:4) )No results found for this or any previous visit (from the past 240 hour(s)).   Radiological Exams on Admission: Dg Foot Complete Left  Result Date: 06/09/2018 CLINICAL DATA:  Swelling and redness EXAM: LEFT FOOT - COMPLETE 3+ VIEW COMPARISON:  08/22/2013 FINDINGS: Old fracture deformity of the proximal shaft of the fifth metatarsal. Bony destructive changes at the TMT joints with Lisfranc deformity; there is lateral displacement of the second through fifth metatarsal bases with respect to the tarsal bones. Bony resorptive changes involving the base of the first metatarsal. Pes planus deformity. Ununited fracture midportion of the second middle phalanx. Mid and distal plantar surface ulcers without soft tissue emphysema or radiopaque foreign body. IMPRESSION: 1. Deformity and bony destructive changes at the level of the TMT joints with Lisfranc deformity present. Findings could be secondary to neuropathic joint assessment for osteomyelitis limited given the gross deformity and bony destructive changes. 2. Ununited fracture with bone resorptive changes at the second middle phalanx. 3. Diffuse soft tissue swelling. Irregularity and lucency at the mid to distal plantar surface of the foot consistent with ulcers. Electronically Signed   By: Donavan Foil M.D.   On: 06/09/2018 18:22    Assessment/Plan Principal Problem:   Cellulitis in diabetic foot (HCC) Active Problems:   Non-ischemic  cardiomyopathy, EF < 20%   Obesity   Type 2 diabetes mellitus with vascular disease (HCC)   CKD (chronic kidney disease), stage III (HCC)   Hyperlipidemia associated with type 2 diabetes mellitus ([place])   Diabetic foot ulcer ([place])   Non-compliant patient     #1 cellulitis and infected foot ulcers: Appears like dry gangrene.  Patient has severe diabetes peripheral vascular disease with noncompliance.  Admit and initiate patient on IV vancomycin and Zosyn.  Get MRI of the foot to rule out osteomyelitis.  Follow orthopedic recommendations.  Follow culture and sensitivity results.  Wound care per orthopedics  #2 nonischemic cardiomyopathy: Continue home regimen and counseling the patient.  #3 diabetes with vascular complications: Tight blood sugar control with sliding scale insulin and home regimen.  #4 hyperlipidemia: Continue with statin.  #5 morbid obesity: Dietary counseling.  #6 medication noncompliance: Counseling will be provided with social worker involvement.  #7 acute on chronic kidney failure: Worsening renal function.  Will slightly hydrate patient and monitor renal function.  Unable to get any dye studies.     DVT prophylaxis: Heparin Code Status: Full code Family Communication: No family available Disposition Plan: To be determined Consults called: Dr. Ninfa Linden of orthopedics Admission status: Inpatient  Severity of Illness: The appropriate patient status for this patient is INPATIENT. Inpatient status is judged to be reasonable and necessary in order to provide the required intensity of service to ensure the patient's safety. The patient's presenting symptoms, physical exam findings, and initial radiographic and laboratory data in the context of their chronic comorbidities is felt to place them at high risk for further clinical deterioration. Furthermore, it is not anticipated that the patient will be medically stable for discharge from the hospital within 2 midnights of  admission. The following factors support the patient status of inpatient.   " The patient's presenting symptoms include left foot swelling. " The worrisome physical exam findings include significant swelling and ulcers of the left foot. " The initial radiographic and laboratory data are worrisome because of x-ray showed no obvious osteomyelitis. " The chronic co-morbidities include diabetes with hypertension and peripheral vascular disease.   * I certify  that at the point of admission it is my clinical judgment that the patient will require inpatient hospital care spanning beyond 2 midnights from the point of admission due to high intensity of service, high risk for further deterioration and high frequency of surveillance required.Barbette Merino MD [facility] Pager [phone]  If 7PM-7AM, please contact night-coverage [email or web address] Password [facility] [place]  06/09/2018, 8:23 PM

## 2018-06-09 NOTE — ED Triage Notes (Signed)
Pt presents with left foot pain and swelling.  Pt also complains of a skin breakdown on left foot that is draining.

## 2018-06-09 NOTE — ED Provider Notes (Addendum)
[facility] EMERGENCY DEPARTMENT Provider Note   CSN: [id number] Arrival date & time: 06/09/18  1712     History   Chief Complaint Chief Complaint  Patient presents with  . Foot Pain    HPI Joshua Massey is a 42 y.o. male.  HPI Complains of swelling of his left foot for the past several days.  He reports that went to [facility] urgent care center earlier today for swelling which has had intermittently broke his foot years ago.  He noted that skin was peeling at the plantar surface was left foot 5 days ago.  When he took the bandage off 2 days ago foot some skin came bandage removal and he noted there was drainage on the bandage.  Urgent care evaluated patient and sent him here for further evaluation no fever.  No other associated symptoms.  Discomfort in his foot is minimal.  No other complaints Past Medical History:  Diagnosis Date  . 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 Diabetes mellitus    adult-onset, type 2  . Exogenous obesity   . GERD (gastroesophageal reflux disease)   . Hypertension     Patient Active Problem List   Diagnosis Date Noted  . Non-compliant patient 12/18/2017  . Chronic venous hypertension (idiopathic) with ulcer and inflammation of bilateral lower extremity (HCC)   . Hypovitaminosis D 11/27/2017  . Diabetic foot ulcer ([place]) 11/27/2017  . Cellulitis in diabetic foot ([place]) 11/27/2017  . Acute heart failure ([place]) 11/27/2017  . Volume overload 11/27/2017  . Hepatopathy 11/27/2017  . Hypokalemia 05/31/2017  . Diabetic peripheral neuropathy ([place]) 12/20/2016  . Hyperlipidemia associated with type 2 diabetes mellitus ([place]) 11/27/2016  . Shingles 10/11/2016  . Status post peripherally inserted central catheter (PICC) central line placement 09/12/2016  . Medication monitoring encounter 09/12/2016  . Arthritis, septic ([place])   . CKD (chronic kidney disease), stage III ([place])   . AKI (acute kidney injury)  ([place])   . Peripheral edema   . Acute renal failure ([place]) 08/17/2016  . Acute systolic CHF (congestive heart failure) ([place]) 08/17/2016  . Type 2 diabetes mellitus with vascular disease ([place]) 10/21/2013  . Chronic systolic heart failure ([place]) 02/27/2012  . Coronary artery disease, 50% LAD at cath 02/16/12 02/23/2012  . Family history of coronary artery disease 02/23/2012  . Cardiogenic shock ([place]) 02/17/2012  . NSVT (nonsustained ventricular tachycardia) ([place]) 02/16/2012  . Obesity 02/16/2012  . Acute on chronic systolic CHF (congestive heart failure) ([place]) 02/16/2012  . CHF (congestive heart failure) ([place]) 02/15/2012  . Hypertension 02/15/2012  . Non-ischemic cardiomyopathy, EF < 20% 02/15/2012  . TINEA CRURIS 09/25/2009  . ALLERGIC RHINITIS CAUSE UNSPECIFIED 09/25/2009  . SCROTAL ABSCESS 09/17/2009  . Hypertensive heart disease with heart failure ([place]) 01/26/2009    Past Surgical History:  Procedure Laterality Date  . ANKLE SURGERY  2003   plate and screws  . CARDIAC CATHETERIZATION  02/16/2012   50% LAD lesion in mid vessel, otherwise no significant obstructive CAD  . HIP PINNING  1990   bilateral  . IR GENERIC HISTORICAL  09/01/2016   IR FLUORO GUIDE CV LINE RIGHT 09/01/2016 Marybelle Killings, MD [facility]  . IR GENERIC HISTORICAL  09/01/2016   IR US GUIDE VASC ACCESS RIGHT 09/01/2016 Marybelle Killings, MD [facility]  . IR GENERIC HISTORICAL  09/16/2016   IR REMOVAL TUN CV CATH W/O FL 09/16/2016 Arne Cleveland, MD [facility]  . KNEE ARTHROSCOPY Left 08/30/2016  Procedure: ARTHROSCOPY KNEE;  Surgeon: Renette Butters, MD;  Location: [facility];  Service: Orthopedics;  Laterality: Left;  . LEFT AND RIGHT HEART CATHETERIZATION WITH CORONARY ANGIOGRAM N/A 02/16/2012   Procedure: LEFT AND RIGHT HEART CATHETERIZATION WITH CORONARY ANGIOGRAM;  Surgeon: Pixie Casino, MD;  Location: [facility] CATH LAB;  Service: Cardiovascular;  Laterality: N/A;  . TRANSTHORACIC ECHOCARDIOGRAM  08/31/2012   EF 35-40%,  no significant wall motion abnormalities, diastolic relaxation abnormality - no longer needed LifeVest; LV cavity size mod dilated with mod conc hypertrophy, systolic function mod reduced; mild MR, LA mod dilated        Home Medications    Prior to Admission medications   Medication Sig Start Date End Date Taking? Authorizing Provider  acetaminophen (TYLENOL) 500 MG tablet Take 1,000 mg by mouth every 6 (six) hours as needed for moderate pain or headache.    [provider]  amiodarone (PACERONE) 200 MG tablet Take 400 mg (2 tabs) twice daily for 1 week, then take 200 mg (1 tab) twice daily for 1 week, then take 200 mg (1 tab) daily. 12/07/17   Danford, Suann Larry, MD  apixaban (ELIQUIS) 5 MG TABS tablet Take 1 tablet (5 mg total) by mouth 2 (two) times daily. 12/07/17   Danford, Suann Larry, MD  atorvastatin (LIPITOR) 10 MG tablet Take 1 tablet (10 mg total) by mouth daily with supper. 12/18/17   [name], [name], MD  carvedilol (COREG) 25 MG tablet Take 1 tablet (25 mg total) by mouth 2 (two) times daily with a meal. 12/08/17   Danford, Suann Larry, MD  Continuous Blood Gluc Receiver (FREESTYLE LIBRE 14 DAY READER) DEVI 1 Device by Does not apply route daily. 01/10/18   [name], [name], MD  Continuous Blood Gluc Sensor (FREESTYLE LIBRE 14 DAY SENSOR) MISC 1 Device by Does not apply route daily. 01/10/18   [name], [name], MD  Ferrous Sulfate (IRON) 325 (65 Fe) MG TABS TAKE 1 TABLET BY MOUTH TWICE DAILY WITH MEALS 05/21/18   [name], [name], MD  glucose blood (FREESTYLE TEST STRIPS) test strip Use as instructed 09/02/16   Ghimire, Henreitta Leber, MD  glucose monitoring kit (FREESTYLE) monitoring kit 1 each by Does not apply route 4 (four) times daily - after meals and at bedtime. 1 month Diabetic Testing Supplies for QAC-QHS accuchecks. 09/02/16   Ghimire, Henreitta Leber, MD  insulin aspart (NOVOLOG FLEXPEN) 100 UNIT/ML FlexPen  0-15U/Subcu/TIDw/meals//CBG70-120:0U/CBG121-150:2U/CBG151-200:3U/CBG201-250:5U/CBG251-300:8U/CBG301-350:11U/CBG351-400:15U//CBG>400:Call MD 01/05/18   [name], [name], MD  insulin glargine (LANTUS) 100 UNIT/ML injection Inject 0.05 mLs (5 Units total) into the skin at bedtime. 12/18/17   [name], [name], MD  Insulin Pen Needle 32G X 8 MM MISC Use as directed 09/02/16   Ghimire, Henreitta Leber, MD  isosorbide-hydrALAZINE (BIDIL) 20-37.5 MG tablet Take 1 tablet by mouth 3 (three) times daily. 12/06/17   Edwin Dada, MD  Lancets (FREESTYLE) lancets Use as instructed 09/02/16   Jonetta Osgood, MD  pantoprazole (PROTONIX) 40 MG tablet Take 1 tablet (40 mg total) by mouth daily before breakfast. 11/02/16   [name], [name], MD  potassium chloride SA (KLOR-CON M20) 20 MEQ tablet Take 2 tablets (40 mEq total) by mouth daily. 12/06/17   Danford, Suann Larry, MD  torsemide (DEMADEX) 20 MG tablet Take 3 tablets (60 mg total) by mouth daily. 12/07/17   Danford, Suann Larry, MD  Vitamin D, Ergocalciferol, (DRISDOL) 50000 units CAPS capsule Take 1 capsule by mouth once weekly for 8 weeks, and then every  2 weeks. 12/18/17   [name], [name], MD    Family History Family History  Problem Relation Age of Onset  . Coronary artery disease Brother        CABG at 2, DM  . Heart disease Brother 42       CAD  . Diabetes Mother   . Hypertension Mother   . Hyperlipidemia Mother   . Heart disease Mother   . Diabetes Father   . Hypertension Father     Social History Social History   Tobacco Use  . Smoking status: Never Smoker  . Smokeless tobacco: Never Used  Substance Use Topics  . Alcohol use: No  . Drug use: No     Allergies   Patient has no known allergies.   Review of Systems Review of Systems  Skin: Positive for wound.       Wound at plantar surface of left foot.  Swollen and reddened at dorsum of left foot  Allergic/Immunologic: Positive for immunocompromised state.       Diabetic  All  other systems reviewed and are negative.    Physical Exam Updated Vital Signs BP (!) 145/95 (BP Location: Right Arm)   Pulse 80   Temp (!) 97.4 F (36.3 C) (Oral)   Resp 18   SpO2 100%   Physical Exam Vitals signs and nursing note reviewed.  Constitutional:      Appearance: He is well-developed.  HENT:     Head: Normocephalic and atraumatic.  Eyes:     Conjunctiva/sclera: Conjunctivae normal.     Pupils: Pupils are equal, round, and reactive to light.  Neck:     Musculoskeletal: Neck supple.     Thyroid: No thyromegaly.     Trachea: No tracheal deviation.  Cardiovascular:     Rate and Rhythm: Normal rate and regular rhythm.     Heart sounds: No murmur.  Pulmonary:     Effort: Pulmonary effort is normal.     Breath sounds: Normal breath sounds.  Abdominal:     General: Bowel sounds are normal. There is no distension.     Palpations: Abdomen is soft.     Tenderness: There is no abdominal tenderness.  Musculoskeletal: Normal range of motion.        General: No tenderness.     Comments: Left lower extremity dorsum of foot is swollen and reddened.  Plantar surface of foot has open wound, slightly foul-smelling.  Necrotic material within the wound.  Foot is nontender.  DP pulse 1+.  Good capillary refill.  Moves toes well.  All other extremities are redness swelling or tenderness neurovascular intact  Skin:    General: Skin is warm and dry.     Findings: No rash.  Neurological:     Mental Status: He is alert.     Coordination: Coordination normal.            ED Treatments / Results  Labs (all labs ordered are listed, but only abnormal results are displayed) Labs Reviewed  CBC  I-STAT CHEM 8, ED    EKG None  Radiology No results found.  Procedures Procedures (including critical care time)  Medications Ordered in ED Medications  Tdap (BOOSTRIX) injection 0.5 mL (has no administration in time range)   Results for orders placed or performed during the  hospital encounter of 06/09/18  CBC  Result Value Ref Range   WBC 9.9 4.0 - 10.5 K/uL   RBC 3.30 (L) 4.22 - 5.81 MIL/uL  Hemoglobin 9.1 (L) 13.0 - 17.0 g/dL   HCT 29.9 (L) 39.0 - 52.0 %   MCV 90.6 80.0 - 100.0 fL   MCH 27.6 26.0 - 34.0 pg   MCHC 30.4 30.0 - 36.0 g/dL   RDW 16.4 (H) 11.5 - 15.5 %   Platelets 466 (H) 150 - 400 K/uL   nRBC 0.2 0.0 - 0.2 %  I-stat chem 8, ed  Result Value Ref Range   Sodium 140 135 - 145 mmol/L   Potassium 5.1 3.5 - 5.1 mmol/L   Chloride 111 98 - 111 mmol/L   BUN 77 (H) 6 - 20 mg/dL   Creatinine, Ser 4.40 (H) 0.61 - 1.24 mg/dL   Glucose, Bld 181 (H) 70 - 99 mg/dL   Calcium, Ion 1.14 (L) 1.15 - 1.40 mmol/L   TCO2 20 (L) 22 - 32 mmol/L   Hemoglobin 10.5 (L) 13.0 - 17.0 g/dL   HCT 31.0 (L) 39.0 - 52.0 %   Dg Foot Complete Left  Result Date: 06/09/2018 CLINICAL DATA:  Swelling and redness EXAM: LEFT FOOT - COMPLETE 3+ VIEW COMPARISON:  08/22/2013 FINDINGS: Old fracture deformity of the proximal shaft of the fifth metatarsal. Bony destructive changes at the TMT joints with Lisfranc deformity; there is lateral displacement of the second through fifth metatarsal bases with respect to the tarsal bones. Bony resorptive changes involving the base of the first metatarsal. Pes planus deformity. Ununited fracture midportion of the second middle phalanx. Mid and distal plantar surface ulcers without soft tissue emphysema or radiopaque foreign body. IMPRESSION: 1. Deformity and bony destructive changes at the level of the TMT joints with Lisfranc deformity present. Findings could be secondary to neuropathic joint assessment for osteomyelitis limited given the gross deformity and bony destructive changes. 2. Ununited fracture with bone resorptive changes at the second middle phalanx. 3. Diffuse soft tissue swelling. Irregularity and lucency at the mid to distal plantar surface of the foot consistent with ulcers. Electronically Signed   By: Donavan Foil M.D.   On:  06/09/2018 18:22    Initial Impression / Assessment and Plan / ED Course  I have reviewed the triage vital signs and the nursing notes.  Pertinent labs & imaging results that were available during my care of the patient were reviewed by me and considered in my medical decision making (see chart for details).     Resting comfortably at 6:40 PM.  I consulted Dr. Ninfa Linden from orthopedics who will see patient in hospital.  I also consulted pharmacist who suggested Augmentin 500` 149m every 12 hours which will treat osteomyelitis. Lab work consistent with acute on chronic renal failure and hyperglycemia and anemia.  X-ray viewed by me Results for orders placed or performed during the hospital encounter of 06/09/18  CBC  Result Value Ref Range   WBC 9.9 4.0 - 10.5 K/uL   RBC 3.30 (L) 4.22 - 5.81 MIL/uL   Hemoglobin 9.1 (L) 13.0 - 17.0 g/dL   HCT 29.9 (L) 39.0 - 52.0 %   MCV 90.6 80.0 - 100.0 fL   MCH 27.6 26.0 - 34.0 pg   MCHC 30.4 30.0 - 36.0 g/dL   RDW 16.4 (H) 11.5 - 15.5 %   Platelets 466 (H) 150 - 400 K/uL   nRBC 0.2 0.0 - 0.2 %  I-stat chem 8, ed  Result Value Ref Range   Sodium 140 135 - 145 mmol/L   Potassium 5.1 3.5 - 5.1 mmol/L   Chloride 111 98 - 111 mmol/L  BUN 77 (H) 6 - 20 mg/dL   Creatinine, Ser 4.40 (H) 0.61 - 1.24 mg/dL   Glucose, Bld 181 (H) 70 - 99 mg/dL   Calcium, Ion 1.14 (L) 1.15 - 1.40 mmol/L   TCO2 20 (L) 22 - 32 mmol/L   Hemoglobin 10.5 (L) 13.0 - 17.0 g/dL   HCT 31.0 (L) 39.0 - 52.0 %   Dg Foot Complete Left  Result Date: 06/09/2018 CLINICAL DATA:  Swelling and redness EXAM: LEFT FOOT - COMPLETE 3+ VIEW COMPARISON:  08/22/2013 FINDINGS: Old fracture deformity of the proximal shaft of the fifth metatarsal. Bony destructive changes at the TMT joints with Lisfranc deformity; there is lateral displacement of the second through fifth metatarsal bases with respect to the tarsal bones. Bony resorptive changes involving the base of the first metatarsal. Pes  planus deformity. Ununited fracture midportion of the second middle phalanx. Mid and distal plantar surface ulcers without soft tissue emphysema or radiopaque foreign body. IMPRESSION: 1. Deformity and bony destructive changes at the level of the TMT joints with Lisfranc deformity present. Findings could be secondary to neuropathic joint assessment for osteomyelitis limited given the gross deformity and bony destructive changes. 2. Ununited fracture with bone resorptive changes at the second middle phalanx. 3. Diffuse soft tissue swelling. Irregularity and lucency at the mid to distal plantar surface of the foot consistent with ulcers. Electronically Signed   By: Donavan Foil M.D.   On: 06/09/2018 18:22   Insulted hospitalist physician Dr. Jonelle Sidle will arrange for overnight stay.  Dr. Jonelle Sidle suggests Change antibiotics to IV Zosyn.  Will also arrange for IV hydration in light of renal failure Final Clinical Impressions(s) / ED Diagnoses   Final diagnoses:  None   #1 diabetic foot ulcer with associated cellulitis #2 hyperglycemia #3 acute on chronic renal failure #4 anemia ED Discharge Orders    None       Orlie Dakin, MD 06/09/18 [place], [place], MD 06/09/18 [phone]

## 2018-06-09 NOTE — ED Triage Notes (Signed)
Pt c/o left foot swelling with skin breakdown.  Seen today at Urgent care across the street and was told foot is infected and directed to ED.  Same foot broken in 2016.  Pt is type 2 diabetic.

## 2018-06-09 NOTE — ED Provider Notes (Addendum)
[facility]   [id number] 06/09/18 Arrival Time: 5284  ASSESSMENT & PLAN:  1. Gangrene of left foot ([place])   2. Diabetes mellitus with peripheral vascular disease ([place])    Fairly rapid progression based on history given. Deferred plain imaging and labs of foot here since I am sending him to the ED. Discussed need for surgical consultation. No fever or hemodynamic instability. Declines transport; prefers to drive himself.  Follow-up Information    Go to  [facility].   Specialty:  Emergency Medicine Contact information: [address] [id number] [place] [phone]         Stable upon discharge.  Reviewed expectations re: course of current medical issues. Questions answered. Outlined signs and symptoms indicating need for more acute intervention. Patient verbalized understanding. After Visit Summary given.   SUBJECTIVE: History from: patient.  ASTOR GENTLE is a 42 y.o. male with multiple chronic medical problems in addition to poorly controlled DM, diabetic peripheral neuropathy, and PVD who presents with complaint of "a cyst on my foot"; left foot. No injury reported. Reports "skin is coming off now." Reports first noticing swelling of his foot about 5 days ago that is now persistent; some pain even with neuropathy. Reports yellowish drainage over the past few days. Has a orthopaedic boot that he has been wearing to help with ambulation. Has been keeping foot wrapped in gauze. No significant calf swelling. Afebrile. Foul odor over the past few days. No h/o similar reported. Normal PO intake without n/v. Reports taking prescribed medications as directed.  ROS: As per HPI. All other systems negative.   OBJECTIVE:  Vitals:   06/09/18 1447  BP: 139/85  Pulse: 81  Resp: 16  Temp: 98.1 F (36.7 C)  TempSrc: Oral  SpO2: 97%    General appearance: alert; no distress Eyes: PERRLA; EOMI;  conjunctiva normal HENT: normocephalic; atraumatic; oral mucosa normal Neck: supple Lungs: clear to auscultation bilaterally; unlabored respirations Heart: regular rate and rhythm Abdomen: soft, non-tender; obese Extremities: (see photo below); swollen L foot with significant and macerated, wet, greenish/yellowish skin breakdown; very foul odor; without crepitus; mild erythema; drainage of yellowish fluid; moderate pain with palpation; some fluctuance although I did not aggressively probe this wound; no active bleeding; unable to palpate DP/PT pulses    Skin: other skin is warm and dry Psychological: alert and cooperative; normal mood and affect   No Known Allergies  Past Medical History:  Diagnosis Date  . 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 Diabetes mellitus    adult-onset, type 2  . Exogenous obesity   . GERD (gastroesophageal reflux disease)   . Hypertension    Social History   Socioeconomic History  . Marital status: Single    Spouse name: Not on file  . Number of children: 6  . Years of education: 71  . Highest education level: Not on file  Occupational History  . Occupation: Brewing technologist    Comment: [name]  . Occupation: newpaper delivery    Employer: [employer]  Social Needs  . Financial resource strain: Not on file  . Food insecurity:    Worry: Not on file    Inability: Not on file  . Transportation needs:    Medical: Not on file    Non-medical: Not on file  Tobacco Use  . Smoking status: Never Smoker  . Smokeless tobacco: Never Used  Substance and  Sexual Activity  . Alcohol use: No  . Drug use: No  . Sexual activity: Yes    Birth control/protection: Condom  Lifestyle  . Physical activity:    Days per week: Not on file    Minutes per session: Not on file  . Stress: Not on file  Relationships  . Social connections:    Talks on phone: Not on file    Gets together: Not on file    Attends  religious service: Not on file    Active member of club or organization: Not on file    Attends meetings of clubs or organizations: Not on file    Relationship status: Not on file  . Intimate partner violence:    Fear of current or ex partner: Not on file    Emotionally abused: Not on file    Physically abused: Not on file    Forced sexual activity: Not on file  Other Topics Concern  . Not on file  Social History Narrative  . Not on file   Family History  Problem Relation Age of Onset  . Coronary artery disease Brother        CABG at 38, DM  . Heart disease Brother 4       CAD  . Diabetes Mother   . Hypertension Mother   . Hyperlipidemia Mother   . Heart disease Mother   . Diabetes Father   . Hypertension Father    Past Surgical History:  Procedure Laterality Date  . ANKLE SURGERY  2003   plate and screws  . CARDIAC CATHETERIZATION  02/16/2012   50% LAD lesion in mid vessel, otherwise no significant obstructive CAD  . HIP PINNING  1990   bilateral  . IR GENERIC HISTORICAL  09/01/2016   IR FLUORO GUIDE CV LINE RIGHT 09/01/2016 Marybelle Killings, MD [facility]  . IR GENERIC HISTORICAL  09/01/2016   IR US GUIDE VASC ACCESS RIGHT 09/01/2016 Marybelle Killings, MD [facility]  . IR GENERIC HISTORICAL  09/16/2016   IR REMOVAL TUN CV CATH W/O FL 09/16/2016 Arne Cleveland, MD [facility]  . KNEE ARTHROSCOPY Left 08/30/2016   Procedure: ARTHROSCOPY KNEE;  Surgeon: Renette Butters, MD;  Location: [facility];  Service: Orthopedics;  Laterality: Left;  . LEFT AND RIGHT HEART CATHETERIZATION WITH CORONARY ANGIOGRAM N/A 02/16/2012   Procedure: LEFT AND RIGHT HEART CATHETERIZATION WITH CORONARY ANGIOGRAM;  Surgeon: Pixie Casino, MD;  Location: [facility] CATH LAB;  Service: Cardiovascular;  Laterality: N/A;  . TRANSTHORACIC ECHOCARDIOGRAM  08/31/2012   EF 35-40%, no significant wall motion abnormalities, diastolic relaxation abnormality - no longer needed LifeVest; LV cavity size mod dilated with mod conc  hypertrophy, systolic function mod reduced; mild MR, LA mod dilated     Vanessa Kick, MD 06/09/18 1555    Vanessa Kick, MD 06/09/18 1555    Vanessa Kick, MD 06/09/18 1558

## 2018-06-09 NOTE — ED Notes (Signed)
Patient advised he needs to go to the ED.  Patient verbalized understanding and agreed to take himself, states "my car is here and I will drive".

## 2018-06-10 ENCOUNTER — Encounter (HOSPITAL_COMMUNITY): Payer: Self-pay | Admitting: Surgery

## 2018-06-10 ENCOUNTER — Inpatient Hospital Stay (HOSPITAL_COMMUNITY): Payer: BLUE CROSS/BLUE SHIELD

## 2018-06-10 ENCOUNTER — Inpatient Hospital Stay (HOSPITAL_COMMUNITY): Payer: BLUE CROSS/BLUE SHIELD | Admitting: Certified Registered Nurse Anesthetist

## 2018-06-10 ENCOUNTER — Encounter (HOSPITAL_COMMUNITY)
Admission: EM | Disposition: A | Discharge status: Home / Self Care | Payer: Self-pay | Source: Ambulatory Visit | Attending: Internal Medicine

## 2018-06-10 ENCOUNTER — Other Ambulatory Visit: Payer: Self-pay

## 2018-06-10 DIAGNOSIS — L03119 Cellulitis of unspecified part of limb: Secondary | ICD-10-CM

## 2018-06-10 DIAGNOSIS — E11628 Type 2 diabetes mellitus with other skin complications: Secondary | ICD-10-CM

## 2018-06-10 DIAGNOSIS — L97401 Non-pressure chronic ulcer of unspecified heel and midfoot limited to breakdown of skin: Secondary | ICD-10-CM

## 2018-06-10 DIAGNOSIS — E08621 Diabetes mellitus due to underlying condition with foot ulcer: Secondary | ICD-10-CM

## 2018-06-10 HISTORY — PX: I&D EXTREMITY: SHX5045

## 2018-06-10 LAB — COMPREHENSIVE METABOLIC PANEL
ALT: 28 U/L (ref 0–44)
AST: 16 U/L (ref 15–41)
Albumin: 1.7 g/dL — ABNORMAL LOW (ref 3.5–5.0)
Alkaline Phosphatase: 211 U/L — ABNORMAL HIGH (ref 38–126)
Anion gap: 10 (ref 5–15)
BUN: 82 mg/dL — ABNORMAL HIGH (ref 6–20)
CO2: 18 mmol/L — ABNORMAL LOW (ref 22–32)
Calcium: 8 mg/dL — ABNORMAL LOW (ref 8.9–10.3)
Chloride: 109 mmol/L (ref 98–111)
Creatinine, Ser: 4.28 mg/dL — ABNORMAL HIGH (ref 0.61–1.24)
GFR calc Af Amer: 19 mL/min — ABNORMAL LOW (ref >60)
GFR calc non Af Amer: 16 mL/min — ABNORMAL LOW (ref >60)
Glucose, Bld: 156 mg/dL — ABNORMAL HIGH (ref 70–99)
Potassium: 4.8 mmol/L (ref 3.5–5.1)
Sodium: 137 mmol/L (ref 135–145)
Total Bilirubin: 1.3 mg/dL — ABNORMAL HIGH (ref 0.3–1.2)
Total Protein: 6.8 g/dL (ref 6.5–8.1)

## 2018-06-10 LAB — GLUCOSE, CAPILLARY
Glucose-Capillary: 126 mg/dL — ABNORMAL HIGH (ref 70–99)
Glucose-Capillary: 134 mg/dL — ABNORMAL HIGH (ref 70–99)
Glucose-Capillary: 120 mg/dL — ABNORMAL HIGH (ref 70–99)
Glucose-Capillary: 93 mg/dL (ref 70–99)
Glucose-Capillary: 95 mg/dL (ref 70–99)
Glucose-Capillary: 112 mg/dL — ABNORMAL HIGH (ref 70–99)

## 2018-06-10 LAB — CBC
HCT: 24.8 % — ABNORMAL LOW (ref 39.0–52.0)
Hemoglobin: 7.8 g/dL — ABNORMAL LOW (ref 13.0–17.0)
MCH: 28.3 pg (ref 26.0–34.0)
MCHC: 31.5 g/dL (ref 30.0–36.0)
MCV: 89.9 fL (ref 80.0–100.0)
NRBC: 0 % (ref 0.0–0.2)
Platelets: 423 10*3/uL — ABNORMAL HIGH (ref 150–400)
RBC: 2.76 MIL/uL — ABNORMAL LOW (ref 4.22–5.81)
RDW: 16.7 % — ABNORMAL HIGH (ref 11.5–15.5)
WBC: 10.1 10*3/uL (ref 4.0–10.5)

## 2018-06-10 LAB — SURGICAL PCR SCREEN
MRSA, PCR: NEGATIVE
Staphylococcus aureus: POSITIVE — AB

## 2018-06-10 LAB — HEMOGLOBIN A1C
Hgb A1c MFr Bld: 8.5 % — ABNORMAL HIGH (ref 4.8–5.6)
MEAN PLASMA GLUCOSE: 197.25 mg/dL

## 2018-06-10 SURGERY — IRRIGATION AND DEBRIDEMENT EXTREMITY
Anesthesia: Regional | Site: Leg Lower | Laterality: Left

## 2018-06-10 MED ORDER — FENTANYL CITRATE (PF) 100 MCG/2ML IJ SOLN
INTRAMUSCULAR | Status: AC
  Filled 2018-06-10: qty 2

## 2018-06-10 MED ORDER — SODIUM CHLORIDE 0.9 % IR SOLN
Status: DC | PRN
  Administered 2018-06-10: 3000 mL

## 2018-06-10 MED ORDER — FENTANYL CITRATE (PF) 100 MCG/2ML IJ SOLN
INTRAMUSCULAR | Status: DC | PRN
  Administered 2018-06-10: 12.5 ug via INTRAVENOUS
  Administered 2018-06-10 (×2): 25 ug via INTRAVENOUS
  Administered 2018-06-10: 12.5 ug via INTRAVENOUS
  Administered 2018-06-10: 25 ug via INTRAVENOUS

## 2018-06-10 MED ORDER — 0.9 % SODIUM CHLORIDE (POUR BTL) OPTIME
TOPICAL | Status: DC | PRN
  Administered 2018-06-10: 1000 mL

## 2018-06-10 MED ORDER — PHENYLEPHRINE 40 MCG/ML (10ML) SYRINGE FOR IV PUSH (FOR BLOOD PRESSURE SUPPORT)
PREFILLED_SYRINGE | INTRAVENOUS | Status: AC
  Filled 2018-06-10: qty 10

## 2018-06-10 MED ORDER — MIDAZOLAM HCL 5 MG/5ML IJ SOLN
INTRAMUSCULAR | Status: DC | PRN
  Administered 2018-06-10 (×2): 1 mg via INTRAVENOUS

## 2018-06-10 MED ORDER — MIDAZOLAM HCL 2 MG/2ML IJ SOLN
INTRAMUSCULAR | Status: AC
  Filled 2018-06-10: qty 2

## 2018-06-10 MED ORDER — GLYCOPYRROLATE PF 0.2 MG/ML IJ SOSY
PREFILLED_SYRINGE | INTRAMUSCULAR | Status: AC
  Filled 2018-06-10: qty 1

## 2018-06-10 MED ORDER — MEPIVACAINE HCL 1.5 % IJ SOLN
INTRAMUSCULAR | Status: DC | PRN
  Administered 2018-06-10: 22 mL via PERINEURAL

## 2018-06-10 MED ORDER — ONDANSETRON HCL 4 MG/2ML IJ SOLN: 4.0000 mg | Freq: Once | INTRAMUSCULAR | Status: DC | PRN

## 2018-06-10 MED ORDER — PROPOFOL 10 MG/ML IV BOLUS
INTRAVENOUS | Status: DC | PRN
  Administered 2018-06-10: 20 mg via INTRAVENOUS

## 2018-06-10 MED ORDER — FENTANYL CITRATE (PF) 250 MCG/5ML IJ SOLN
INTRAMUSCULAR | Status: AC
  Filled 2018-06-10: qty 5

## 2018-06-10 MED ORDER — MUPIROCIN 2 % EX OINT
1.0000 "application " | TOPICAL_OINTMENT | Freq: Two times a day (BID) | CUTANEOUS | Status: DC
  Administered 2018-06-10 – 2018-06-14 (×8): 1 via NASAL
  Filled 2018-06-10 (×3): qty 22

## 2018-06-10 MED ORDER — EPHEDRINE SULFATE 50 MG/ML IJ SOLN
INTRAMUSCULAR | Status: DC | PRN
  Administered 2018-06-10: 10 mg via INTRAVENOUS
  Administered 2018-06-10: 15 mg via INTRAVENOUS

## 2018-06-10 MED ORDER — EPHEDRINE 5 MG/ML INJ
INTRAVENOUS | Status: AC
  Filled 2018-06-10: qty 10

## 2018-06-10 MED ORDER — GLYCOPYRROLATE 0.2 MG/ML IJ SOLN
INTRAMUSCULAR | Status: DC | PRN
  Administered 2018-06-10: 0.1 mg via INTRAVENOUS

## 2018-06-10 MED ORDER — PROPOFOL 500 MG/50ML IV EMUL
INTRAVENOUS | Status: DC | PRN
  Administered 2018-06-10: 50 ug/kg/min via INTRAVENOUS

## 2018-06-10 MED ORDER — ONDANSETRON HCL 4 MG/2ML IJ SOLN
INTRAMUSCULAR | Status: DC | PRN
  Administered 2018-06-10: 4 mg via INTRAVENOUS

## 2018-06-10 MED ORDER — FENTANYL CITRATE (PF) 100 MCG/2ML IJ SOLN: 25.0000 ug | INTRAMUSCULAR | Status: DC | PRN

## 2018-06-10 MED ORDER — PHENYLEPHRINE HCL 10 MG/ML IJ SOLN
INTRAMUSCULAR | Status: DC | PRN
  Administered 2018-06-10 (×3): 80 ug via INTRAVENOUS
  Administered 2018-06-10: 120 ug via INTRAVENOUS
  Administered 2018-06-10: 40 ug via INTRAVENOUS

## 2018-06-10 MED ORDER — ONDANSETRON HCL 4 MG/2ML IJ SOLN
INTRAMUSCULAR | Status: AC
  Filled 2018-06-10: qty 2

## 2018-06-10 SURGICAL SUPPLY — 56 items
BANDAGE ACE 3X5.8 VEL STRL LF (GAUZE/BANDAGES/DRESSINGS) IMPLANT
BANDAGE COBAN STERILE 2 (GAUZE/BANDAGES/DRESSINGS) ×3 IMPLANT
BANDAGE ELASTIC 4 VELCRO ST LF (GAUZE/BANDAGES/DRESSINGS) ×3 IMPLANT
BLADE SURG 10 STRL SS (BLADE) ×3 IMPLANT
BNDG COHESIVE 4X5 TAN STRL (GAUZE/BANDAGES/DRESSINGS) ×3 IMPLANT
BNDG COHESIVE 6X5 TAN STRL LF (GAUZE/BANDAGES/DRESSINGS) ×6 IMPLANT
BNDG ELASTIC 6X10 VLCR STRL LF (GAUZE/BANDAGES/DRESSINGS) ×3 IMPLANT
BNDG GAUZE ELAST 4 BULKY (GAUZE/BANDAGES/DRESSINGS) ×3 IMPLANT
BNDG STRETCH 4X75 NS LF (GAUZE/BANDAGES/DRESSINGS) ×3 IMPLANT
COVER SURGICAL LIGHT HANDLE (MISCELLANEOUS) ×3 IMPLANT
COVER WAND RF STERILE (DRAPES) ×3 IMPLANT
CUFF TOURNIQUET SINGLE 18IN (TOURNIQUET CUFF) ×3 IMPLANT
CUFF TOURNIQUET SINGLE 34IN LL (TOURNIQUET CUFF) IMPLANT
DRAPE ORTHO SPLIT 77X108 STRL (DRAPES) ×4
DRAPE SURG 17X23 STRL (DRAPES) IMPLANT
DRAPE SURG ORHT 6 SPLT 77X108 (DRAPES) ×2 IMPLANT
DRAPE U-SHAPE 47X51 STRL (DRAPES) ×3 IMPLANT
DURAPREP 26ML APPLICATOR (WOUND CARE) ×3 IMPLANT
ELECT REM PT RETURN 9FT ADLT (ELECTROSURGICAL) ×3
ELECTRODE REM PT RTRN 9FT ADLT (ELECTROSURGICAL) ×1 IMPLANT
GAUZE SPONGE 4X4 12PLY STRL (GAUZE/BANDAGES/DRESSINGS) ×3 IMPLANT
GAUZE SPONGE 4X4 16PLY XRAY LF (GAUZE/BANDAGES/DRESSINGS) ×6 IMPLANT
GAUZE XEROFORM 1X8 LF (GAUZE/BANDAGES/DRESSINGS) ×3 IMPLANT
GAUZE XEROFORM 5X9 LF (GAUZE/BANDAGES/DRESSINGS) ×3 IMPLANT
GLOVE BIO SURGEON STRL SZ8 (GLOVE) ×3 IMPLANT
GLOVE BIOGEL PI IND STRL 8 (GLOVE) ×2 IMPLANT
GLOVE BIOGEL PI INDICATOR 8 (GLOVE) ×4
GLOVE ORTHO TXT STRL SZ7.5 (GLOVE) ×3 IMPLANT
GOWN STRL REUS W/ TWL LRG LVL3 (GOWN DISPOSABLE) ×2 IMPLANT
GOWN STRL REUS W/ TWL XL LVL3 (GOWN DISPOSABLE) ×2 IMPLANT
GOWN STRL REUS W/TWL LRG LVL3 (GOWN DISPOSABLE) ×4
GOWN STRL REUS W/TWL XL LVL3 (GOWN DISPOSABLE) ×4
HANDPIECE INTERPULSE COAX TIP (DISPOSABLE) ×2
KIT BASIN OR (CUSTOM PROCEDURE TRAY) ×3 IMPLANT
KIT TURNOVER KIT B (KITS) ×3 IMPLANT
MANIFOLD NEPTUNE II (INSTRUMENTS) ×3 IMPLANT
NS IRRIG 1000ML POUR BTL (IV SOLUTION) ×3 IMPLANT
PACK ORTHO EXTREMITY (CUSTOM PROCEDURE TRAY) ×3 IMPLANT
PAD ARMBOARD 7.5X6 YLW CONV (MISCELLANEOUS) ×6 IMPLANT
PAD CAST 4YDX4 CTTN HI CHSV (CAST SUPPLIES) ×1 IMPLANT
PADDING CAST ABS 4INX4YD NS (CAST SUPPLIES) ×4
PADDING CAST ABS COTTON 4X4 ST (CAST SUPPLIES) ×2 IMPLANT
PADDING CAST COTTON 4X4 STRL (CAST SUPPLIES) ×2
PADDING CAST COTTON 6X4 STRL (CAST SUPPLIES) ×3 IMPLANT
SET HNDPC FAN SPRY TIP SCT (DISPOSABLE) ×1 IMPLANT
SPONGE LAP 18X18 X RAY DECT (DISPOSABLE) ×3 IMPLANT
STOCKINETTE IMPERVIOUS 9X36 MD (GAUZE/BANDAGES/DRESSINGS) ×3 IMPLANT
SUT ETHILON 2 0 FS 18 (SUTURE) IMPLANT
SUT ETHILON 3 0 PS 1 (SUTURE) IMPLANT
SWAB CULTURE ESWAB REG 1ML (MISCELLANEOUS) ×6 IMPLANT
TOWEL OR 17X24 6PK STRL BLUE (TOWEL DISPOSABLE) ×3 IMPLANT
TOWEL OR 17X26 10 PK STRL BLUE (TOWEL DISPOSABLE) ×3 IMPLANT
TUBE CONNECTING 12'X1/4 (SUCTIONS) ×1
TUBE CONNECTING 12X1/4 (SUCTIONS) ×2 IMPLANT
UNDERPAD 30X30 (UNDERPADS AND DIAPERS) ×3 IMPLANT
YANKAUER SUCT BULB TIP NO VENT (SUCTIONS) ×3 IMPLANT

## 2018-06-10 NOTE — Progress Notes (Signed)
PROGRESS NOTE    Joshua Massey  TIR:[id number] DOB: January 27, 1976 DOA: 06/09/2018 PCP: [name], [name], MD     Brief Narrative:  Joshua Massey is a 42 y.o. male with medical history significant of insulin-dependent diabetes but noncompliant with treatment, nonischemic cardiomyopathy with EF of 20%, chronic kidney disease stage III, Atrial flutter on Eliquis, GERD, hypertension, morbid obesity who apparently noted peeling of the skin of his left foot at the plantar aspect about 5 days ago.  Patient place a Band-Aid over it but when he took it off 2 days ago the whole skin came off.  There was drainage on the bandage.  Went to urgent care center where he was evaluated and found to have significant ulcer of the left foot.  He broke the same foot couple of years ago but that has healed.  He has significant diabetic neuropathy with poor sensation.  Patient has coronary artery disease with previous LAD lesion up to 50%.  He has peripheral vascular disease but no documented ABIs.  In the ER he has bilateral gangrenous lesions of his feet.  On the left it is the whole forefoot at the plantar surface on the right it affects the big toe.  There is also corresponding erythema and edema of both feet.  Orthopedics was consulted and patient is being admitted for treatment..  New events last 24 hours / Subjective: Patient very poor historian and noncompliant with treatment. He has no physical complaints of pain, fever, chills, chest pain, SOB, N/V.   Assessment & Plan:   Principal Problem:   Cellulitis in diabetic foot ([place]) Active Problems:   Non-ischemic cardiomyopathy, EF < 20%   Obesity   Type 2 diabetes mellitus with vascular disease (HCC)   CKD (chronic kidney disease), stage III (HCC)   Hyperlipidemia associated with type 2 diabetes mellitus ([place])   Diabetic foot ulcer ([place])   Non-compliant patient   Bilateral diabetic foot ulcers -MRI left foot shows severe arthropathy, previous  fracture/dislocation, first metatarsal base erosion with draining sinus tract, ulceration plantar medial ball of foot with local necrosis of soft tissues  -Will also check Xray right foot  -Continue vanco/zosyn -Orthopedic surgery consulted -IVF   Type 2 DM with complications including CKD, wound  -Ha1c 8.5  -Lantus, SSI novolog   CAD -Lipitor, bidil   CKD stage 4-5  -Baseline Cr 6 months ago ~ 3.3 -Admitted with Cr 4.4 --> 4.28  -Trend BMP   Chronic systolic and diastolic HF -EF 67-61% with diffuse hypokinesis, grade 2 diastolic dysfunction -Demadex   Hx A Flutter -Eliquis on hold, pending possible surgical procedure, last dose 12/21 PM  -Amiodarone, coreg   Morbid obesity -Body mass index is 41.36 kg/m.    DVT prophylaxis: Holding eliquis pending possible surgery  Code Status: Full Family Communication: No family at bedside Disposition Plan: Pending work up, surgery consult   Consultants:   Orthopedic surgery  Procedures:   None   Antimicrobials:  Anti-infectives (From admission, onward)   Start     Dose/Rate Route Frequency Ordered Stop   06/09/18 2200  piperacillin-tazobactam (ZOSYN) IVPB 3.375 g  Status:  Discontinued     3.375 g 12.5 mL/hr over 240 Minutes Intravenous Every 12 hours 06/09/18 2033 06/09/18 2137   06/09/18 2200  piperacillin-tazobactam (ZOSYN) IVPB 3.375 g     3.375 g 12.5 mL/hr over 240 Minutes Intravenous Every 8 hours 06/09/18 2137     06/09/18 2100  piperacillin-tazobactam (ZOSYN) IVPB 3.375 g  Status:  Discontinued     3.375 g 12.5 mL/hr over 240 Minutes Intravenous Every 8 hours 06/09/18 2027 06/09/18 2033   06/09/18 2045  vancomycin (VANCOCIN) 2,000 mg in sodium chloride 0.9 % 500 mL IVPB     2,000 mg 250 mL/hr over 120 Minutes Intravenous  Once 06/09/18 2041 06/10/18 0114   06/09/18 2043  vancomycin variable dose per unstable renal function (pharmacist dosing)      Does not apply See admin instructions 06/09/18 2043      06/09/18 2000  amoxicillin-clavulanate (AUGMENTIN) 500-125 MG per tablet 500 mg  Status:  Discontinued     1 tablet Oral 2 times daily 06/09/18 1851 06/09/18 2033   06/09/18 1900  ceFAZolin (ANCEF) IVPB 1 g/50 mL premix  Status:  Discontinued     1 g 100 mL/hr over 30 Minutes Intravenous  Once 06/09/18 1846 06/09/18 1847       Objective: Vitals:   06/09/18 1723 06/09/18 2003 06/09/18 2035 06/10/18 0600  BP: (!) 145/95 (!) 129/98  124/82  Pulse: 80 75  73  Resp: 18     Temp: (!) 97.4 F (36.3 C) 97.6 F (36.4 C)  97.8 F (36.6 C)  TempSrc: Oral Oral  Oral  SpO2: 100% 100%  99%  Weight:   134.5 kg   Height:   5\' 11"  (1.803 m)     Intake/Output Summary (Last 24 hours) at 06/10/2018 1000 Last data filed at 06/10/2018 0600 Gross per 24 hour  Intake 310.42 ml  Output 700 ml  Net -389.58 ml   Filed Weights   06/09/18 2035  Weight: 134.5 kg    Examination:  General exam: Appears calm and comfortable  Respiratory system: Clear to auscultation. Respiratory effort normal. Cardiovascular system: S1 & S2 heard, RRR. No JVD, murmurs, rubs, gallops or clicks. No pedal edema. Gastrointestinal system: Abdomen is nondistended, soft and nontender. No organomegaly or masses felt. Normal bowel sounds heard. Central nervous system: Alert and oriented. No focal neurological deficits. Psychiatry: Judgement and insight appear normal. Mood & affect appropriate.          Data Reviewed: I have personally reviewed following labs and imaging studies  CBC: Recent Labs  Lab 06/09/18 1748 06/09/18 1756 06/10/18 0338  WBC 9.9  --  10.1  HGB 9.1* 10.5* 7.8*  HCT 29.9* 31.0* 24.8*  MCV 90.6  --  89.9  PLT 466*  --  409*   Basic Metabolic Panel: Recent Labs  Lab 06/09/18 1756 06/10/18 0338  NA 140 137  K 5.1 4.8  CL 111 109  CO2  --  18*  GLUCOSE 181* 156*  BUN 77* 82*  CREATININE 4.40* 4.28*  CALCIUM  --  8.0*   GFR: Estimated Creatinine Clearance: 31.8 mL/min (A) (by C-G  formula based on SCr of 4.28 mg/dL (H)). Liver Function Tests: Recent Labs  Lab 06/10/18 0338  AST 16  ALT 28  ALKPHOS 211*  BILITOT 1.3*  PROT 6.8  ALBUMIN 1.7*   No results for input(s): LIPASE, AMYLASE in the last 168 hours. No results for input(s): AMMONIA in the last 168 hours. Coagulation Profile: No results for input(s): INR, PROTIME in the last 168 hours. Cardiac Enzymes: No results for input(s): CKTOTAL, CKMB, CKMBINDEX, TROPONINI in the last 168 hours. BNP (last 3 results) No results for input(s): PROBNP in the last 8760 hours. HbA1C: Recent Labs    06/10/18 0338  HGBA1C 8.5*   CBG: Recent Labs  Lab 06/09/18 2241 06/10/18 0604  GLUCAP 122*  126*   Lipid Profile: No results for input(s): CHOL, HDL, LDLCALC, TRIG, CHOLHDL, LDLDIRECT in the last 72 hours. Thyroid Function Tests: No results for input(s): TSH, T4TOTAL, FREET4, T3FREE, THYROIDAB in the last 72 hours. Anemia Panel: No results for input(s): VITAMINB12, FOLATE, FERRITIN, TIBC, IRON, RETICCTPCT in the last 72 hours. Sepsis Labs: No results for input(s): PROCALCITON, LATICACIDVEN in the last 168 hours.  No results found for this or any previous visit (from the past 240 hour(s)).     Radiology Studies: Mr Foot Left Wo Contrast  Result Date: 06/10/2018 CLINICAL DATA:  Open wound medially on the left midfoot, nonhealing ulcer. Diabetes. EXAM: MRI OF THE LEFT FOOT WITHOUT CONTRAST TECHNIQUE: Multiplanar, multisequence MR imaging of the left forefoot was performed. No intravenous contrast was administered. COMPARISON:  06/09/2018 FINDINGS: Bones/Joint/Cartilage Markedly severe Lisfranc arthropathy with primarily homolateral dislocation (although primarily dorsal dislocation of the first metatarsal with respect to the medial cuneiform) common erosions, and some early fragmentation as well as marked irregularity of the articular surfaces and subcortical marrow edema most pronounced medially. The second  through fifth metatarsals are considerably further laterally displaced than the first. There may certainly be a component of Charcot arthropathy. The severe arthropathy at the first MTP joint is associated with an effusion at the articulation or pseudoarticulation, which appears to track caudad through the soft tissues all the way to the skin surface on image 36/7 and image 16/9. This would favor a draining infection. Ligaments The Lisfranc ligament is not intact. Muscles and Tendons Generalized edema in the plantar musculature of the foot. Soft tissues Soft tissue ulceration of the medial ball of the foot with a small amount of gas tracking in the regional subcutaneous soft tissues as shown on image 25/6. Overlying bandaging noted. As noted above, there is an effusion at the articulation or pseudoarticulation between the first metatarsal and the medial cuneiform, which appears to have a draining sinus tract extending to the plantar medial foot on image 36/7. This is proximal to the dominant region of ulceration. This would imply a draining infection. IMPRESSION: 1. 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 The second through fifth metatarsals are dislocated laterally in the first metatarsal is dislocated dorsally with respect to the midfoot, with fragmentation and articular irregularity along the Lisfranc joint which has a significant chronic component. 2. There is a large erosion of the base of the first metatarsal. An effusion of the articulation/pseudoarticulation of the first metatarsal with the medial cuneiform is present and appears to drain through a sinus tract down to the medial plantar soft tissues of the foot for example on image 36/7. This would tend to imply infection of the articulation and a draining sinus tract. 3. Ulceration along the plantar medial ball of the foot with extensive subcutaneous  phlegmon and a small amount of gas in the soft tissues, compatible with cellulitis and local necrosis of the soft tissues. Electronically Signed   By: Van Clines M.D.   On: 06/10/2018 09:45   Dg Foot Complete Left  Result Date: 06/09/2018 CLINICAL DATA:  Swelling and redness EXAM: LEFT FOOT - COMPLETE 3+ VIEW COMPARISON:  08/22/2013 FINDINGS: Old fracture deformity of the proximal shaft of the fifth metatarsal. Bony destructive changes at the TMT joints with Lisfranc deformity; there is lateral displacement of the second through fifth metatarsal bases with respect to the tarsal bones. Bony resorptive changes involving the base of the  first metatarsal. Pes planus deformity. Ununited fracture midportion of the second middle phalanx. Mid and distal plantar surface ulcers without soft tissue emphysema or radiopaque foreign body. IMPRESSION: 1. Deformity and bony destructive changes at the level of the TMT joints with Lisfranc deformity present. Findings could be secondary to neuropathic joint assessment for osteomyelitis limited given the gross deformity and bony destructive changes. 2. Ununited fracture with bone resorptive changes at the second middle phalanx. 3. Diffuse soft tissue swelling. Irregularity and lucency at the mid to distal plantar surface of the foot consistent with ulcers. Electronically Signed   By: Donavan Foil M.D.   On: 06/09/2018 18:22      Scheduled Meds: . amiodarone  200 mg Oral BID  . atorvastatin  10 mg Oral Q supper  . carvedilol  25 mg Oral BID WC  . ferrous sulfate  325 mg Oral BID WC  . insulin aspart  0-20 Units Subcutaneous TID WC  . insulin aspart  0-5 Units Subcutaneous QHS  . insulin glargine  5 Units Subcutaneous QHS  . isosorbide-hydrALAZINE  1 tablet Oral TID  . mupirocin ointment  1 application Nasal BID  . pantoprazole  40 mg Oral QAC breakfast  . torsemide  60 mg Oral Daily  . vancomycin variable dose per unstable renal function (pharmacist  dosing)   Does not apply See admin instructions   Continuous Infusions: . sodium chloride 100 mL/hr at 06/10/18 0600  . piperacillin-tazobactam (ZOSYN)  IV Stopped (06/10/18 0327)     LOS: 1 day    Time spent: 40 minutes   Dessa Phi, DO [facility] [email or web address] Password [email or web address] 06/10/2018, 10:00 AM

## 2018-06-10 NOTE — Brief Op Note (Signed)
06/10/2018  3:28 PM  PATIENT:  Joshua Massey  42 y.o. male  PRE-OPERATIVE DIAGNOSIS:  diabetic ulcer of midfoot, left  POST-OPERATIVE DIAGNOSIS:  diabetic ulcer of midfoot, left  PROCEDURE:  Procedure(s): IRRIGATION AND DEBRIDEMENT OF FOOT (Left)  SURGEON:  Surgeon(s) and Role:    Mcarthur Rossetti, MD - Primary  PHYSICIAN ASSISTANT: Benita Stabile, PA-C  ANESTHESIA:   regional  COUNTS:    TOURNIQUET:  * No tourniquets in log *  DICTATION: .Other Dictation: Dictation Number [id number]  PLAN OF CARE: Admit to inpatient   PATIENT DISPOSITION:  PACU - guarded condition.   Delay start of Pharmacological VTE agent (>24hrs) due to surgical blood loss or risk of bleeding: no

## 2018-06-10 NOTE — Progress Notes (Signed)
1532 CBG done in OR by PACU RN (Ranulfo Kall) prior to trx to PACU Not repeated in PACU

## 2018-06-10 NOTE — Progress Notes (Signed)
ANTICOAGULATION CONSULT NOTE - Initial Consult  Pharmacy Consult for Heparin Indication: atrial fibrillation  No Known Allergies  Patient Measurements: Height: 5\' 11"  (180.3 cm) Weight: 296 lb 8.3 oz (134.5 kg) IBW/kg (Calculated) : 75.3 Heparin Dosing Weight: 106.2  Vital Signs: Temp: 97.8 F (36.6 C) (12/22 0600) Temp Source: Oral (12/22 0600) BP: 124/82 (12/22 0600) Pulse Rate: 73 (12/22 0600)  Labs: Recent Labs    06/09/18 1748 06/09/18 1756 06/10/18 0338  HGB 9.1* 10.5* 7.8*  HCT 29.9* 31.0* 24.8*  PLT 466*  --  423*  CREATININE  --  4.40* 4.28*    Estimated Creatinine Clearance: 31.8 mL/min (A) (by C-G formula based on SCr of 4.28 mg/dL (H)).   Medical History: Past Medical History:  Diagnosis Date  . 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 Diabetes mellitus    adult-onset, type 2  . Exogenous obesity   . GERD (gastroesophageal reflux disease)   . Hypertension     Assessment: Pt is a 42 YO M on Eliquis PTA for Aflutter. Last dose taken 06/09/18 at 2349. Eliquis held d/t evaluation for surgery (possible osteo/DM foot ulcer). Per Dr. Maylene Roes ok to hold heparin bridge. Based on pt's CrCl, can hold for 48 hours prior to surgery. Per RN, pt's foot bleeds when pressure is applied. Had bleeding from foot overnight when standing but has stopped now. Hgb dropped from 10.5 to 7.8, plts ok.  Goal of Therapy:  Monitor platelets by anticoagulation protocol: Yes   Plan:  Hold Eliquis Monitor bleeding F/u plans for surgery F/u restart anticoagulation    Juanell Fairly, PharmD PGY1 Pharmacy Resident Phone [phone] 06/10/2018 8:30 AM

## 2018-06-10 NOTE — Anesthesia Postprocedure Evaluation (Signed)
Anesthesia Post Note  Patient: KYLER GERMER  Procedure(s) Performed: IRRIGATION AND DEBRIDEMENT OF FOOT (Left Leg Lower)     Patient location during evaluation: PACU Anesthesia Type: Regional Level of consciousness: awake and alert Pain management: pain level controlled Vital Signs Assessment: post-procedure vital signs reviewed and stable Respiratory status: spontaneous breathing, nonlabored ventilation, respiratory function stable and patient connected to nasal cannula oxygen Cardiovascular status: stable and blood pressure returned to baseline Postop Assessment: no apparent nausea or vomiting Anesthetic complications: no    Last Vitals:  Vitals:   06/10/18 1600 06/10/18 1617  BP: 117/84 118/87  Pulse: 68 69  Resp: (!) 22 18  Temp: 36.5 C 36.8 C  SpO2: 100% 100%    Last Pain:  Vitals:   06/10/18 1617  TempSrc: Oral  PainSc:                  Ryan P Ellender

## 2018-06-10 NOTE — Consult Note (Signed)
Reason for Consult: Left foot infection Referring Physician: Enio Hornback is an 42 y.o. male.  HPI:  Mr. Myler is a 42 year old male with a medical history significant for insulin-dependent diabetes, nonischemic cardiomyopathy with EF of 20%, chronic kidney disease stage III GERD hypertension morbid obesity.  He reports less past Monday now some 6 days he noted some peeling plantar aspect of his left foot.  Placed a Band-Aid over the area when it became worse.  He went to urgent care yesterday due to the left foot ulcer was sent to the ER due to the lesion on the left foot.  He denies any known injury to the foot.  Denies any fevers, chills, shortness of breath, chest pain, nausea or vomiting. Non smoker.   Past Medical History:  Diagnosis Date  . 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 Diabetes mellitus    adult-onset, type 2  . Exogenous obesity   . GERD (gastroesophageal reflux disease)   . Hypertension     Past Surgical History:  Procedure Laterality Date  . ANKLE SURGERY  2003   plate and screws  . CARDIAC CATHETERIZATION  02/16/2012   50% LAD lesion in mid vessel, otherwise no significant obstructive CAD  . HIP PINNING  1990   bilateral  . IR GENERIC HISTORICAL  09/01/2016   IR FLUORO GUIDE CV LINE RIGHT 09/01/2016 Marybelle Killings, MD [facility]  . IR GENERIC HISTORICAL  09/01/2016   IR US GUIDE VASC ACCESS RIGHT 09/01/2016 Marybelle Killings, MD [facility]  . IR GENERIC HISTORICAL  09/16/2016   IR REMOVAL TUN CV CATH W/O FL 09/16/2016 Arne Cleveland, MD [facility]  . KNEE ARTHROSCOPY Left 08/30/2016   Procedure: ARTHROSCOPY KNEE;  Surgeon: Renette Butters, MD;  Location: [facility];  Service: Orthopedics;  Laterality: Left;  . LEFT AND RIGHT HEART CATHETERIZATION WITH CORONARY ANGIOGRAM N/A 02/16/2012   Procedure: LEFT AND RIGHT HEART CATHETERIZATION WITH CORONARY ANGIOGRAM;  Surgeon: Pixie Casino, MD;  Location: [facility] CATH LAB;  Service:  Cardiovascular;  Laterality: N/A;  . TRANSTHORACIC ECHOCARDIOGRAM  08/31/2012   EF 35-40%, no significant wall motion abnormalities, diastolic relaxation abnormality - no longer needed LifeVest; LV cavity size mod dilated with mod conc hypertrophy, systolic function mod reduced; mild MR, LA mod dilated    Family History  Problem Relation Age of Onset  . Coronary artery disease Brother        CABG at 30, DM  . Heart disease Brother 34       CAD  . Diabetes Mother   . Hypertension Mother   . Hyperlipidemia Mother   . Heart disease Mother   . Diabetes Father   . Hypertension Father     Social History:  reports that he has never smoked. He has never used smokeless tobacco. He reports that he does not drink alcohol or use drugs.  Allergies: No Known Allergies  Medications: I have reviewed the patient's current medications.  Results for orders placed or performed during the hospital encounter of 06/09/18 (from the past 48 hour(s))  CBC     Status: Abnormal   Collection Time: 06/09/18  5:48 PM  Result Value Ref Range   WBC 9.9 4.0 - 10.5 K/uL   RBC 3.30 (L) 4.22 - 5.81 MIL/uL   Hemoglobin 9.1 (L) 13.0 - 17.0 g/dL   HCT 29.9 (L) 39.0 - 52.0 %   MCV 90.6 80.0 - 100.0 fL   MCH 27.6  26.0 - 34.0 pg   MCHC 30.4 30.0 - 36.0 g/dL   RDW 16.4 (H) 11.5 - 15.5 %   Platelets 466 (H) 150 - 400 K/uL   nRBC 0.2 0.0 - 0.2 %    Comment: Performed at [facility], [place] [address] [place], [place] [address]  I-stat chem 8, ed     Status: Abnormal   Collection Time: 06/09/18  5:56 PM  Result Value Ref Range   Sodium 140 135 - 145 mmol/L   Potassium 5.1 3.5 - 5.1 mmol/L   Chloride 111 98 - 111 mmol/L   BUN 77 (H) 6 - 20 mg/dL   Creatinine, Ser 4.40 (H) 0.61 - 1.24 mg/dL   Glucose, Bld 181 (H) 70 - 99 mg/dL   Calcium, Ion 1.14 (L) 1.15 - 1.40 mmol/L   TCO2 20 (L) 22 - 32 mmol/L   Hemoglobin 10.5 (L) 13.0 - 17.0 g/dL   HCT 31.0 (L) 39.0 - 52.0 %  Glucose, capillary     Status: Abnormal    Collection Time: 06/09/18 10:41 PM  Result Value Ref Range   Glucose-Capillary 122 (H) 70 - 99 mg/dL   Comment 1 Document in Chart   CBC     Status: Abnormal   Collection Time: 06/10/18  3:38 AM  Result Value Ref Range   WBC 10.1 4.0 - 10.5 K/uL   RBC 2.76 (L) 4.22 - 5.81 MIL/uL   Hemoglobin 7.8 (L) 13.0 - 17.0 g/dL    Comment: REPEATED TO VERIFY DELTA CHECK NOTED    HCT 24.8 (L) 39.0 - 52.0 %   MCV 89.9 80.0 - 100.0 fL   MCH 28.3 26.0 - 34.0 pg   MCHC 31.5 30.0 - 36.0 g/dL   RDW 16.7 (H) 11.5 - 15.5 %   Platelets 423 (H) 150 - 400 K/uL   nRBC 0.0 0.0 - 0.2 %    Comment: Performed at [facility], [place] [address], [place], [place] [address]  Comprehensive metabolic panel     Status: Abnormal   Collection Time: 06/10/18  3:38 AM  Result Value Ref Range   Sodium 137 135 - 145 mmol/L   Potassium 4.8 3.5 - 5.1 mmol/L   Chloride 109 98 - 111 mmol/L   CO2 18 (L) 22 - 32 mmol/L   Glucose, Bld 156 (H) 70 - 99 mg/dL   BUN 82 (H) 6 - 20 mg/dL   Creatinine, Ser 4.28 (H) 0.61 - 1.24 mg/dL   Calcium 8.0 (L) 8.9 - 10.3 mg/dL   Total Protein 6.8 6.5 - 8.1 g/dL   Albumin 1.7 (L) 3.5 - 5.0 g/dL   AST 16 15 - 41 U/L   ALT 28 0 - 44 U/L   Alkaline Phosphatase 211 (H) 38 - 126 U/L   Total Bilirubin 1.3 (H) 0.3 - 1.2 mg/dL   GFR calc non Af Amer 16 (L) >60 mL/min   GFR calc Af Amer 19 (L) >60 mL/min   Anion gap 10 5 - 15    Comment: Performed at [facility], [place] [address] [place], [place] [address]  Hemoglobin A1c     Status: Abnormal   Collection Time: 06/10/18  3:38 AM  Result Value Ref Range   Hgb A1c MFr Bld 8.5 (H) 4.8 - 5.6 %    Comment: (NOTE) Pre diabetes:          5.7%-6.4% Diabetes:              >  6.4% Glycemic control for   <7.0% adults with diabetes    Mean Plasma Glucose 197.25 mg/dL    Comment: Performed at [facility], [place] [address], [place], [place] [address]  Glucose, capillary     Status: Abnormal   Collection Time: 06/10/18  6:04 AM  Result  Value Ref Range   Glucose-Capillary 126 (H) 70 - 99 mg/dL   Comment 1 Document in Chart     Dg Foot Complete Left  Result Date: 06/09/2018 CLINICAL DATA:  Swelling and redness EXAM: LEFT FOOT - COMPLETE 3+ VIEW COMPARISON:  08/22/2013 FINDINGS: Old fracture deformity of the proximal shaft of the fifth metatarsal. Bony destructive changes at the TMT joints with Lisfranc deformity; there is lateral displacement of the second through fifth metatarsal bases with respect to the tarsal bones. Bony resorptive changes involving the base of the first metatarsal. Pes planus deformity. Ununited fracture midportion of the second middle phalanx. Mid and distal plantar surface ulcers without soft tissue emphysema or radiopaque foreign body. IMPRESSION: 1. Deformity and bony destructive changes at the level of the TMT joints with Lisfranc deformity present. Findings could be secondary to neuropathic joint assessment for osteomyelitis limited given the gross deformity and bony destructive changes. 2. Ununited fracture with bone resorptive changes at the second middle phalanx. 3. Diffuse soft tissue swelling. Irregularity and lucency at the mid to distal plantar surface of the foot consistent with ulcers. Electronically Signed   By: Donavan Foil M.D.   On: 06/09/2018 18:22    Review of Systems  Constitutional: Negative for chills and fever.  Cardiovascular: Negative for chest pain.  Gastrointestinal: Negative for nausea and vomiting.  Left foot: Dorsal pedal pulse intact sensation grossly intact.  Large plantar ulcer over the second metatarsal midshaft region with sloughing skin and significant maceration foul odor expressible purulence is present.  No active bleeding. Right foot dorsal pedal pulses present.  Sensation grossly intact.  Grade 2 ulcer toe medial border with Blood pressure 124/82, pulse 73, temperature 97.8 F (36.6 C), temperature source Oral, resp. rate 18, height 5\' 11"  (1.803 m), weight 134.5 kg,  SpO2 99 %.   Physical Exam  Constitutional: He is oriented to person, place, and time. He appears well-developed and well-nourished. No distress.  Respiratory: Effort normal.  Neurological: He is alert and oriented to person, place, and time.  Skin: He is not diaphoretic.  Psychiatric: He has a normal mood and affect.    Assessment/Plan: 1. Diabetic left foot ulcer with purulent drainage and cellulitis.  Currently on vancomycin and Zosyn. Need urgent irrigation and debridement.  May need additional surgery for repeat I&D and possible partial amputation.  2. NPO 3.Urgency discussed with patient that he could become septic and the possible need for additional surgeries.  4. Radiographs left foot show a lisfranc deformity , second middle phalanx non united fracture with bony resorption. Sclerotic changes at lisfranc joint.Soft tissue swelling mid plantar foot.  MRI left foot results pending.  GILBERT CLARK 06/10/2018, 9:21 AM

## 2018-06-10 NOTE — Anesthesia Procedure Notes (Signed)
Procedure Name: MAC Date/Time: 06/10/2018 2:40 PM Performed by: Kathryne Hitch, CRNA Pre-anesthesia Checklist: Patient identified, Emergency Drugs available, Suction available, Patient being monitored and Timeout performed Patient Re-evaluated:Patient Re-evaluated prior to induction Oxygen Delivery Method: Simple face mask Preoxygenation: Pre-oxygenation with 100% oxygen Induction Type: IV induction Dental Injury: Teeth and Oropharynx as per pre-operative assessment

## 2018-06-10 NOTE — Plan of Care (Signed)

## 2018-06-10 NOTE — Anesthesia Procedure Notes (Signed)
Anesthesia Regional Block: Popliteal block   Pre-Anesthetic Checklist: ,, timeout performed, Correct Patient, Correct Site, Correct Laterality, Correct Procedure,, site marked, risks and benefits discussed, Surgical consent,  Pre-op evaluation,  At surgeon's request and post-op pain management  Laterality: Left  Prep: chloraprep       Needles:  Injection technique: Single-shot  Needle Type: Echogenic Stimulator Needle     Needle Length: 10cm  Needle Gauge: 21     Additional Needles:   Procedures:,,,, ultrasound used (permanent image in chart),,,,  Narrative:  Start time: 06/10/2018 2:15 PM End time: 06/10/2018 2:25 PM Injection made incrementally with aspirations every 5 mL.  Performed by: Personally  Anesthesiologist: Murvin Natal, MD  Additional Notes: Functioning IV was confirmed and monitors were applied.  A 124mm 21ga [place] echogenic stimulator needle was used. Sterile prep, hand hygiene and sterile gloves were used.  Negative aspiration and negative test dose prior to incremental administration of local anesthetic. The patient tolerated the procedure well.

## 2018-06-10 NOTE — Anesthesia Preprocedure Evaluation (Addendum)
Anesthesia Evaluation  Patient identified by MRN, date of birth, ID band Patient awake    Reviewed: Allergy & Precautions, NPO status , Patient's Chart, lab work & pertinent test results  Airway Mallampati: II  TM Distance: >3 FB Neck ROM: Full    Dental  (+) Chipped,    Pulmonary neg pulmonary ROS,    Pulmonary exam normal breath sounds clear to auscultation       Cardiovascular hypertension, Pt. on home beta blockers + CAD and +CHF  Normal cardiovascular exam Rhythm:Regular Rate:Normal  ECG: rate 76. Normal sinus rhythm Low voltage QRS. Incomplete left bundle branch block  ECHO: LV EF: 15% -   20% Severe biventricular dilatation and systolic dysfunction. No thrombus in the left ventricular apex.   Neuro/Psych negative neurological ROS  negative psych ROS   GI/Hepatic Neg liver ROS, GERD  Controlled,  Endo/Other  diabetes, Insulin DependentMorbid obesity  Renal/GU CRFRenal disease     Musculoskeletal negative musculoskeletal ROS (+)   Abdominal (+) + obese,   Peds  Hematology  (+) anemia , HLD   Anesthesia Other Findings Diabetic ulcer of midfoot  Reproductive/Obstetrics                            Anesthesia Physical Anesthesia Plan  ASA: IV  Anesthesia Plan: Regional   Post-op Pain Management:    Induction: Intravenous  PONV Risk Score and Plan: 1 and Treatment may vary due to age or medical condition  Airway Management Planned: Natural Airway  Additional Equipment:   Intra-op Plan:   Post-operative Plan:   Informed Consent: I have reviewed the patients History and Physical, chart, labs and discussed the procedure including the risks, benefits and alternatives for the proposed anesthesia with the patient or authorized representative who has indicated his/her understanding and acceptance.   Dental advisory given  Plan Discussed with: CRNA  Anesthesia Plan Comments:         Anesthesia Quick Evaluation

## 2018-06-10 NOTE — Transfer of Care (Signed)
Immediate Anesthesia Transfer of Care Note  Patient: Joshua Massey  Procedure(s) Performed: IRRIGATION AND DEBRIDEMENT OF FOOT (Left Leg Lower)  Patient Location: PACU  Anesthesia Type:MAC and Regional  Level of Consciousness: awake, alert , oriented and patient cooperative  Airway & Oxygen Therapy: Patient Spontanous Breathing and Patient connected to nasal cannula oxygen  Post-op Assessment: Report given to RN and Post -op Vital signs reviewed and stable  Post vital signs: Reviewed and stable  Last Vitals:  Vitals Value Taken Time  BP 89/65 06/10/2018  3:32 PM  Temp    Pulse 72 06/10/2018  3:34 PM  Resp 19 06/10/2018  3:34 PM  SpO2 95 % 06/10/2018  3:34 PM  Vitals shown include unvalidated device data.  Last Pain:  Vitals:   06/10/18 1301  TempSrc: Oral  PainSc:          Complications: No apparent anesthesia complications

## 2018-06-11 ENCOUNTER — Encounter (HOSPITAL_COMMUNITY): Payer: Self-pay | Admitting: Orthopaedic Surgery

## 2018-06-11 LAB — CBC
HCT: 26.1 % — ABNORMAL LOW (ref 39.0–52.0)
Hemoglobin: 8.1 g/dL — ABNORMAL LOW (ref 13.0–17.0)
MCH: 28.7 pg (ref 26.0–34.0)
MCHC: 31 g/dL (ref 30.0–36.0)
MCV: 92.6 fL (ref 80.0–100.0)
Platelets: 414 10*3/uL — ABNORMAL HIGH (ref 150–400)
RBC: 2.82 MIL/uL — ABNORMAL LOW (ref 4.22–5.81)
RDW: 16.6 % — ABNORMAL HIGH (ref 11.5–15.5)
WBC: 9.7 10*3/uL (ref 4.0–10.5)
nRBC: 0 % (ref 0.0–0.2)

## 2018-06-11 LAB — GLUCOSE, CAPILLARY
Glucose-Capillary: 131 mg/dL — ABNORMAL HIGH (ref 70–99)
Glucose-Capillary: 129 mg/dL — ABNORMAL HIGH (ref 70–99)
Glucose-Capillary: 118 mg/dL — ABNORMAL HIGH (ref 70–99)
Glucose-Capillary: 146 mg/dL — ABNORMAL HIGH (ref 70–99)

## 2018-06-11 LAB — BASIC METABOLIC PANEL
Anion gap: 14 (ref 5–15)
BUN: 77 mg/dL — ABNORMAL HIGH (ref 6–20)
CO2: 17 mmol/L — ABNORMAL LOW (ref 22–32)
Calcium: 7.7 mg/dL — ABNORMAL LOW (ref 8.9–10.3)
Chloride: 110 mmol/L (ref 98–111)
Creatinine, Ser: 4.19 mg/dL — ABNORMAL HIGH (ref 0.61–1.24)
GFR calc Af Amer: 19 mL/min — ABNORMAL LOW (ref >60)
GFR calc non Af Amer: 16 mL/min — ABNORMAL LOW (ref >60)
Glucose, Bld: 107 mg/dL — ABNORMAL HIGH (ref 70–99)
Potassium: 5.2 mmol/L — ABNORMAL HIGH (ref 3.5–5.1)
Sodium: 141 mmol/L (ref 135–145)

## 2018-06-11 MED ORDER — FLUTICASONE PROPIONATE 50 MCG/ACT NA SUSP
1.0000 | Freq: Every day | NASAL | Status: DC
  Administered 2018-06-11 – 2018-06-14 (×4): 1 via NASAL
  Filled 2018-06-11 (×2): qty 16

## 2018-06-11 MED ORDER — HEPARIN (PORCINE) 25000 UT/250ML-% IV SOLN
2100.0000 [IU]/h | INTRAVENOUS | Status: DC
  Administered 2018-06-11: 1450 [IU]/h via INTRAVENOUS
  Administered 2018-06-13 (×2): 1850 [IU]/h via INTRAVENOUS
  Administered 2018-06-14: 2100 [IU]/h via INTRAVENOUS
  Filled 2018-06-11 (×7): qty 250

## 2018-06-11 MED ORDER — VANCOMYCIN HCL 10 G IV SOLR
1250.0000 mg | INTRAVENOUS | Status: DC
  Administered 2018-06-11: 1250 mg via INTRAVENOUS
  Filled 2018-06-11: qty 1250

## 2018-06-11 NOTE — Progress Notes (Signed)
Patient ID: Joshua Massey, male   DOB: 05-Aug-1975, 42 y.o.   MRN: [id number] The patient understands fully the need to return to the OR tomorrow 12/24 for a repeat I&D of his left foot.  The goal is trying to salvage the foot.  May also place a VAC sponge.  Hopefully this can get done under regional anesthesia similar to his initial surgery yesterday.

## 2018-06-11 NOTE — Plan of Care (Signed)
  Problem: Clinical Measurements: Goal: Ability to maintain clinical measurements within normal limits will improve Outcome: Progressing   Problem: Coping: Goal: Level of anxiety will decrease Outcome: Progressing   Problem: Pain Managment: Goal: General experience of comfort will improve Outcome: Progressing   Problem: Safety: Goal: Ability to remain free from injury will improve Outcome: Progressing   Problem: Skin Integrity: Goal: Risk for impaired skin integrity will decrease Outcome: Progressing   Problem: Skin Integrity: Goal: Risk for impaired skin integrity will decrease Outcome: Progressing

## 2018-06-11 NOTE — Progress Notes (Signed)
ANTICOAGULATION CONSULT NOTE - Initial Consult  Pharmacy Consult for Heparin Indication: atrial fibrillation  No Known Allergies  Patient Measurements: Height: 5\' 11"  (180.3 cm) Weight: 296 lb 8.3 oz (134.5 kg) IBW/kg (Calculated) : 75.3 Heparin Dosing Weight: 106.2  Vital Signs: Temp: 98.2 F (36.8 C) (12/23 0635) Temp Source: Oral (12/23 0635) BP: 122/85 (12/23 0900) Pulse Rate: 76 (12/23 0900)  Labs: Recent Labs    06/09/18 1748 06/09/18 1756 06/10/18 0338 06/11/18 0240  HGB 9.1* 10.5* 7.8* 8.1*  HCT 29.9* 31.0* 24.8* 26.1*  PLT 466*  --  423* 414*  CREATININE  --  4.40* 4.28* 4.19*    Estimated Creatinine Clearance: 32.5 mL/min (A) (by C-G formula based on SCr of 4.19 mg/dL (H)).   Medical History: Past Medical History:  Diagnosis Date  . 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 Diabetes mellitus    adult-onset, type 2  . Exogenous obesity   . GERD (gastroesophageal reflux disease)   . Hypertension     Assessment: Pt is a 42 YO M on Eliquis PTA for Aflutter. Last dose taken 06/09/18 at 2349. Held for I&Ds. Now close to 48 hrs after last Eliquis dose, to transition to heparin bridge tonight. Hgb 8.1, plts 414.  Goal of Therapy:  Monitor platelets by anticoagulation protocol: Yes   Plan:  Continue to hold Eliquis Start heparin gtt at 1,450 units/hr at 2000 tonight Monitor daily heparin level / aPTT, CBC, s/s of bleed  Elenor Quinones, PharmD, BCPS, [facility] Clinical Pharmacist Phone number [phone] 06/11/2018 2:30 PM

## 2018-06-11 NOTE — Progress Notes (Signed)
  Pharmacy Antibiotic Note  Joshua Massey is a 42 y.o. male admitted on 06/09/2018 with cellulitis  Pharmacy has been consulted for Vancomycin and Zosyn dosing.   On abx for nectrotic foot wound. S/p I&D on 12/23 with plans to repeat soon. Afebrile, WBC wnl. SCr down to 4.19, UOP good.  Plan: Continue Zosyn 3.375 gm IV q8h (4 hour infusion) Start vancomycin 1,250mg  IV Q36h tonight Monitor clinical picture, renal function, vanc levels prn F/U C&S, abx deescalation / LOT   Height: 5\' 11"  (180.3 cm) Weight: 296 lb 8.3 oz (134.5 kg) IBW/kg (Calculated) : 75.3  Temp (24hrs), Avg:98 F (36.7 C), Min:97.5 F (36.4 C), Max:99 F (37.2 C)  Recent Labs  Lab 06/09/18 1748 06/09/18 1756 06/10/18 0338 06/11/18 0240  WBC 9.9  --  10.1 9.7  CREATININE  --  4.40* 4.28* 4.19*    Estimated Creatinine Clearance: 32.5 mL/min (A) (by C-G formula based on SCr of 4.19 mg/dL (H)).    No Known Allergies  Elenor Quinones, PharmD, BCPS, [facility] Clinical Pharmacist Phone number [phone] 06/11/2018 8:50 AM

## 2018-06-11 NOTE — Plan of Care (Signed)

## 2018-06-11 NOTE — Progress Notes (Signed)
Inpatient Diabetes Program Recommendations  AACE/ADA: New Consensus Statement on Inpatient Glycemic Control (2015)  Target Ranges:  Prepandial:   less than 140 mg/dL      Peak postprandial:   less than 180 mg/dL (1-2 hours)      Critically ill patients:  140 - 180 mg/dL   Lab Results  Component Value Date   GLUCAP 131 (H) 06/11/2018   HGBA1C 8.5 (H) 06/10/2018    Review of Glycemic Control Results for KEIGEN, CADDELL (MRN [id number]) as of 06/11/2018 08:03  Ref. Range 06/10/2018 13:05 06/10/2018 15:22 06/10/2018 16:46 06/10/2018 21:37 06/11/2018 06:38  Glucose-Capillary Latest Ref Range: 70 - 99 mg/dL 120 (H) 93 95 112 (H) 131 (H)   Diabetes history: DM2 Outpatient Diabetes medications: Lantus 5 units qd + Novolog correction scale tid Current orders for Inpatient glycemic control: Lantus 5 units + Novolog resistant correction scale tid + hs  Inpatient Diabetes Program Recommendations:   Received referral regarding glycemic control. Noted A1c has decreased from 11.5 on 12/01/17 to 8.5 on 06/10/18. Will plan to speak with patient today regarding glycemic control and benefits of taking medications as prescribed.  Thank you, [name]. Cassadie Pankonin, RN, MSN, CDE  Diabetes Coordinator Inpatient Glycemic Control Team Team Pager [phone] (8am-5pm) 06/11/2018 8:05 AM

## 2018-06-11 NOTE — Progress Notes (Signed)
Patient ID: Joshua Massey, male   DOB: Aug 22, 1975, 42 y.o.   MRN: [id number] The patient tolerated surgery well on his left foot yesterday.  Due to his significant heart disease, this was done under regional anesthesia and light sedation.  I large area of infection was found along with necrotic tissue.  He will need a repeat I&D of that left foot in 24-48 hours.  This was explained to him.  He will also likely need a VAC placed on the foot wound that we will go home with.  A lot will depend on the condition we find the foot in during his next I&D.

## 2018-06-11 NOTE — Progress Notes (Signed)
PROGRESS NOTE    Joshua Massey  KYH:[id number] DOB: [birth year] DOA: 06/09/2018 PCP: [name], [name], MD     Brief Narrative:  Joshua Massey is a 42 y.o. male with medical history significant of insulin-dependent diabetes but noncompliant with treatment, nonischemic cardiomyopathy with EF of 20%, chronic kidney disease stage III, Atrial flutter on Eliquis, GERD, hypertension, morbid obesity who apparently noted peeling of the skin of his left foot at the plantar aspect about 5 days ago.  Patient place a Band-Aid over it but when he took it off 2 days ago the whole skin came off.  There was drainage on the bandage.  Went to urgent care center where he was evaluated and found to have significant ulcer of the left foot.  He broke the same foot couple of years ago but that has healed.  He has significant diabetic neuropathy with poor sensation.  Patient has coronary artery disease with previous LAD lesion up to 50%.  He has peripheral vascular disease but no documented ABIs.  In the ER he has bilateral gangrenous lesions of his feet.  On the left it is the whole forefoot at the plantar surface on the right it affects the big toe.  There is also corresponding erythema and edema of both feet.  Orthopedics was consulted and patient is being admitted for treatment.   New events last 24 hours / Subjective: Underwent irrigation and debridement of left plantar foot abscess 12/22. Patient without acute complaints today. Denies fevers, chill, chest pain, shortness of breath, nausea, vomiting, abdominal pain.  Assessment & Plan:   Principal Problem:   Cellulitis in diabetic foot ([place]) Active Problems:   Non-ischemic cardiomyopathy, EF < 20%   Obesity   Type 2 diabetes mellitus with vascular disease (HCC)   CKD (chronic kidney disease), stage III (HCC)   Hyperlipidemia associated with type 2 diabetes mellitus ([place])   Diabetic foot ulcer ([place])   Non-compliant patient   Bilateral diabetic foot ulcers  with left plantar foot abscess  -MRI left foot shows severe arthropathy, previous fracture/dislocation, first metatarsal base erosion with draining sinus tract, ulceration plantar medial ball of foot with local necrosis of soft tissues  -S/p irrigation and debridement of left plantar foot abscess 12/22 with Dr. Ninfa Linden -Continue vanco/zosyn -Orthopedic surgery planning for repeat I&D this week   Type 2 DM with complications including CKD, diabetic foot ulcers -Ha1c 8.5  -Lantus, SSI novolog  -Diabetes coordinator consulted -Blood sugars well controlled today  CAD -Lipitor, bidil   CKD stage 4-5  -Baseline Cr 6 months ago ~ 3.3 -Admitted with Cr 4.4 --> 4.28 --> 4.19 (may be new baseline)  -Trend BMP   Chronic systolic and diastolic HF -EF 15-17% with diffuse hypokinesis, grade 2 diastolic dysfunction -Demadex   Hx A Flutter -Eliquis on hold, pending possible surgical procedure, last dose 12/21 PM  -Amiodarone, coreg   Morbid obesity -Body mass index is 41.36 kg/m.    DVT prophylaxis: Holding eliquis pending repeat procedure  Code Status: Full Family Communication: No family at bedside Disposition Plan: Pending repeat I&D    Consultants:   Orthopedic surgery  Procedures:   S/p irrigation and debridement of left plantar foot abscess 12/22 with Dr. Ninfa Linden  Antimicrobials:  Anti-infectives (From admission, onward)   Start     Dose/Rate Route Frequency Ordered Stop   06/11/18 2000  vancomycin (VANCOCIN) 1,250 mg in sodium chloride 0.9 % 250 mL IVPB     1,250 mg 166.7 mL/hr over 90  Minutes Intravenous Every 36 hours 06/11/18 0849     06/09/18 2200  piperacillin-tazobactam (ZOSYN) IVPB 3.375 g  Status:  Discontinued     3.375 g 12.5 mL/hr over 240 Minutes Intravenous Every 12 hours 06/09/18 2033 06/09/18 2137   06/09/18 2200  piperacillin-tazobactam (ZOSYN) IVPB 3.375 g     3.375 g 12.5 mL/hr over 240 Minutes Intravenous Every 8 hours 06/09/18 2137     06/09/18  2100  piperacillin-tazobactam (ZOSYN) IVPB 3.375 g  Status:  Discontinued     3.375 g 12.5 mL/hr over 240 Minutes Intravenous Every 8 hours 06/09/18 2027 06/09/18 2033   06/09/18 2045  vancomycin (VANCOCIN) 2,000 mg in sodium chloride 0.9 % 500 mL IVPB     2,000 mg 250 mL/hr over 120 Minutes Intravenous  Once 06/09/18 2041 06/10/18 0114   06/09/18 2043  vancomycin variable dose per unstable renal function (pharmacist dosing)  Status:  Discontinued      Does not apply See admin instructions 06/09/18 2043 06/11/18 0849   06/09/18 2000  amoxicillin-clavulanate (AUGMENTIN) 500-125 MG per tablet 500 mg  Status:  Discontinued     1 tablet Oral 2 times daily 06/09/18 1851 06/09/18 2033   06/09/18 1900  ceFAZolin (ANCEF) IVPB 1 g/50 mL premix  Status:  Discontinued     1 g 100 mL/hr over 30 Minutes Intravenous  Once 06/09/18 1846 06/09/18 1847       Objective: Vitals:   06/10/18 1617 06/10/18 2001 06/11/18 0635 06/11/18 0900  BP: 118/87 121/85 123/86 122/85  Pulse: 69 72 78 76  Resp: 18     Temp: 98.2 F (36.8 C) (!) 97.5 F (36.4 C) 98.2 F (36.8 C)   TempSrc: Oral Axillary Oral   SpO2: 100% 99% 98%   Weight:      Height:        Intake/Output Summary (Last 24 hours) at 06/11/2018 1016 Last data filed at 06/11/2018 0616 Gross per 24 hour  Intake 300 ml  Output 1975 ml  Net -1675 ml   Filed Weights   06/09/18 2035  Weight: 134.5 kg   Examination: General exam: Appears calm and comfortable  Respiratory system: Clear to auscultation. Respiratory effort normal. Cardiovascular system: S1 & S2 heard, RRR. No JVD, murmurs, rubs, gallops or clicks. No pedal edema. Gastrointestinal system: Abdomen is nondistended, soft and nontender. No organomegaly or masses felt. Normal bowel sounds heard. Central nervous system: Alert and oriented. No focal neurological deficits. Extremities: Right great toe wrapped and Left foot wrapped in clean and dry dressing/ACE wrap  Psychiatry: Judgement  and insight appear normal. Mood & affect appropriate.   Data Reviewed: I have personally reviewed following labs and imaging studies  CBC: Recent Labs  Lab 06/09/18 1748 06/09/18 1756 06/10/18 0338 06/11/18 0240  WBC 9.9  --  10.1 9.7  HGB 9.1* 10.5* 7.8* 8.1*  HCT 29.9* 31.0* 24.8* 26.1*  MCV 90.6  --  89.9 92.6  PLT 466*  --  423* 509*   Basic Metabolic Panel: Recent Labs  Lab 06/09/18 1756 06/10/18 0338 06/11/18 0240  NA 140 137 141  K 5.1 4.8 5.2*  CL 111 109 110  CO2  --  18* 17*  GLUCOSE 181* 156* 107*  BUN 77* 82* 77*  CREATININE 4.40* 4.28* 4.19*  CALCIUM  --  8.0* 7.7*   GFR: Estimated Creatinine Clearance: 32.5 mL/min (A) (by C-G formula based on SCr of 4.19 mg/dL (H)). Liver Function Tests: Recent Labs  Lab 06/10/18 0338  AST  16  ALT 28  ALKPHOS 211*  BILITOT 1.3*  PROT 6.8  ALBUMIN 1.7*   No results for input(s): LIPASE, AMYLASE in the last 168 hours. No results for input(s): AMMONIA in the last 168 hours. Coagulation Profile: No results for input(s): INR, PROTIME in the last 168 hours. Cardiac Enzymes: No results for input(s): CKTOTAL, CKMB, CKMBINDEX, TROPONINI in the last 168 hours. BNP (last 3 results) No results for input(s): PROBNP in the last 8760 hours. HbA1C: Recent Labs    06/10/18 0338  HGBA1C 8.5*   CBG: Recent Labs  Lab 06/10/18 1305 06/10/18 1522 06/10/18 1646 06/10/18 2137 06/11/18 0638  GLUCAP 120* 93 95 112* 131*   Lipid Profile: No results for input(s): CHOL, HDL, LDLCALC, TRIG, CHOLHDL, LDLDIRECT in the last 72 hours. Thyroid Function Tests: No results for input(s): TSH, T4TOTAL, FREET4, T3FREE, THYROIDAB in the last 72 hours. Anemia Panel: No results for input(s): VITAMINB12, FOLATE, FERRITIN, TIBC, IRON, RETICCTPCT in the last 72 hours. Sepsis Labs: No results for input(s): PROCALCITON, LATICACIDVEN in the last 168 hours.  Recent Results (from the past 240 hour(s))  Surgical PCR screen     Status: Abnormal    Collection Time: 06/10/18  9:55 AM  Result Value Ref Range Status   MRSA, PCR NEGATIVE NEGATIVE Final   Staphylococcus aureus POSITIVE (A) NEGATIVE Final    Comment: (NOTE) The Xpert SA Assay (FDA approved for NASAL specimens in patients 25 years of age and older), is one component of a comprehensive surveillance program. It is not intended to diagnose infection nor to guide or monitor treatment.   Aerobic/Anaerobic Culture (surgical/deep wound)     Status: None (Preliminary result)   Collection Time: 06/10/18  2:15 PM  Result Value Ref Range Status   Specimen Description ABSCESS LEFT,FOOT  Final   Special Requests NONE  Final   Gram Stain   Final    RARE WBC PRESENT, PREDOMINANTLY PMN FEW GRAM POSITIVE COCCI IN PAIRS RARE GRAM NEGATIVE RODS Performed at [facility], [place] [address] [place], [place] [address]    Culture PENDING  Incomplete   Report Status PENDING  Incomplete       Radiology Studies: Mr Foot Left Wo Contrast  Result Date: 06/10/2018 CLINICAL DATA:  Open wound medially on the left midfoot, nonhealing ulcer. Diabetes. EXAM: MRI OF THE LEFT FOOT WITHOUT CONTRAST TECHNIQUE: Multiplanar, multisequence MR imaging of the left forefoot was performed. No intravenous contrast was administered. COMPARISON:  06/09/2018 FINDINGS: Bones/Joint/Cartilage Markedly severe Lisfranc arthropathy with primarily homolateral dislocation (although primarily dorsal dislocation of the first metatarsal with respect to the medial cuneiform) common erosions, and some early fragmentation as well as marked irregularity of the articular surfaces and subcortical marrow edema most pronounced medially. The second through fifth metatarsals are considerably further laterally displaced than the first. There may certainly be a component of Charcot arthropathy. The severe arthropathy at the first MTP joint is associated with an effusion at the articulation or pseudoarticulation, which appears to  track caudad through the soft tissues all the way to the skin surface on image 36/7 and image 16/9. This would favor a draining infection. Ligaments The Lisfranc ligament is not intact. Muscles and Tendons Generalized edema in the plantar musculature of the foot. Soft tissues Soft tissue ulceration of the medial ball of the foot with a small amount of gas tracking in the regional subcutaneous soft tissues as shown on image 25/6. Overlying bandaging noted. As noted above, there is an effusion at  the articulation or pseudoarticulation between the first metatarsal and the medial cuneiform, which appears to have a draining sinus tract extending to the plantar medial foot on image 36/7. This is proximal to the dominant region of ulceration. This would imply a draining infection. IMPRESSION: 1. 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 The second through fifth metatarsals are dislocated laterally in the first metatarsal is dislocated dorsally with respect to the midfoot, with fragmentation and articular irregularity along the Lisfranc joint which has a significant chronic component. 2. There is a large erosion of the base of the first metatarsal. An effusion of the articulation/pseudoarticulation of the first metatarsal with the medial cuneiform is present and appears to drain through a sinus tract down to the medial plantar soft tissues of the foot for example on image 36/7. This would tend to imply infection of the articulation and a draining sinus tract. 3. Ulceration along the plantar medial ball of the foot with extensive subcutaneous phlegmon and a small amount of gas in the soft tissues, compatible with cellulitis and local necrosis of the soft tissues. Electronically Signed   By: Van Clines M.D.   On: 06/10/2018 09:45   Dg Foot 2 Views Right  Result Date: 06/10/2018 CLINICAL DATA:  Diabetic ulcer  right foot plantar aspect great toe. EXAM: RIGHT FOOT - 2 VIEW COMPARISON:  11/27/2017 FINDINGS: Hardware over the distal fibula intact. Mild degenerate change over the midfoot/hindfoot. Degenerative change with pencil in cup deformity of the second PIP joint. No significant air in the soft tissues over the plantar aspect of the great toe. No bone destruction to suggest osteomyelitis. IMPRESSION: No acute findings. Degenerative changes as described. Pencil in cup deformity of the second PIP joint seen with psoriatic arthritis although may be due to rheumatoid arthritis or reactive arthritis. Electronically Signed   By: Marin Olp M.D.   On: 06/10/2018 11:13   Dg Foot Complete Left  Result Date: 06/09/2018 CLINICAL DATA:  Swelling and redness EXAM: LEFT FOOT - COMPLETE 3+ VIEW COMPARISON:  08/22/2013 FINDINGS: Old fracture deformity of the proximal shaft of the fifth metatarsal. Bony destructive changes at the TMT joints with Lisfranc deformity; there is lateral displacement of the second through fifth metatarsal bases with respect to the tarsal bones. Bony resorptive changes involving the base of the first metatarsal. Pes planus deformity. Ununited fracture midportion of the second middle phalanx. Mid and distal plantar surface ulcers without soft tissue emphysema or radiopaque foreign body. IMPRESSION: 1. Deformity and bony destructive changes at the level of the TMT joints with Lisfranc deformity present. Findings could be secondary to neuropathic joint assessment for osteomyelitis limited given the gross deformity and bony destructive changes. 2. Ununited fracture with bone resorptive changes at the second middle phalanx. 3. Diffuse soft tissue swelling. Irregularity and lucency at the mid to distal plantar surface of the foot consistent with ulcers. Electronically Signed   By: Donavan Foil M.D.   On: 06/09/2018 18:22      Scheduled Meds: . amiodarone  200 mg Oral BID  . atorvastatin  10 mg Oral Q  supper  . carvedilol  25 mg Oral BID WC  . ferrous sulfate  325 mg Oral BID WC  . insulin aspart  0-20 Units Subcutaneous TID WC  . insulin aspart  0-5 Units Subcutaneous QHS  . insulin glargine  5 Units Subcutaneous QHS  . isosorbide-hydrALAZINE  1 tablet Oral TID  .  mupirocin ointment  1 application Nasal BID  . pantoprazole  40 mg Oral QAC breakfast  . torsemide  60 mg Oral Daily   Continuous Infusions: . piperacillin-tazobactam (ZOSYN)  IV 3.375 g (06/11/18 0616)  . vancomycin       LOS: 2 days    Time spent: 25 minutes   Dessa Phi, DO [facility] [email or web address] Password [facility] 06/11/2018, 10:16 AM

## 2018-06-11 NOTE — Op Note (Signed)
NAME: Joshua Massey, Joshua Massey MEDICAL RECORD [id number] ACCOUNT [id number] DATE OF BIRTH:[birth year] FACILITY: [facility] LOCATION: [facility] PHYSICIAN:Sadi Arave Kerry Fort, MD  OPERATIVE REPORT  DATE OF PROCEDURE:  06/10/2018  PREOPERATIVE DIAGNOSIS:  Left plantar foot infection/abscess.  POSTOPERATIVE DIAGNOSIS:  Left plantar foot infection/abscess.  PROCEDURE:  Extensive irrigation and debridement of left plantar foot abscess with sharp excisional debridement over 5-7 cm of necrotic skin and fascia.  FINDINGS:  Gross purulence of the plantar aspect of the left foot with obvious necrosis.  SURGEON:  Lind Guest. Ninfa Linden, MD  ASSISTANT:  Erskine Emery PA-C  ANESTHESIA:  Left lower extremity regional block.  ESTIMATED BLOOD LOSS:  100 mL.  COMPLICATIONS:  None.  INDICATIONS:  The patient is a 42 year old diabetic with multiple medical problems including a low ejection fraction from significant heart disease.  He presented last night to the emergency room with acute on chronic renal issues, but also having wounds  on both his feet.  He has a wound on his right big toe on the medial aspect of it.  He also has a large wound on the bottom of his left foot of the plantar aspect.  The right foot wound appears to be stable, the left foot has gross purulence coming out  of the wound.  Given the nature of the abscess,  we recommend at least today having an urgent irrigation and debridement of the left foot abscess to get this under control.  He will likely need some intervention on his right great toe as well, but today  we need to get the infection under control over his left foot.  I had a long and thorough discussion about this.  He understands and is in jeopardy of having some more extensive surgery on his foot and losing his foot as well.  I think he has a chance of  losing his right great toe.  He understands today, surgery is to temporize the infection aspect of things.  DESCRIPTION OF  PROCEDURE:  After informed consent was obtained and appropriate left foot was marked.  He was brought to the operating room after Anesthesia obtained a regional left lower extremity block in the holding room.  He was very comfortable.  He  was placed supine on the operating table.  His left foot and ankle were prepped and draped with Betadine scrub and paint.  Timeout was called and he was identified as the correct patient, correct left foot.  I then used a #10 blade to perform a sharp  excisional debridement down the plantar aspect of his foot having removed a wide area of necrotic skin as well as fascia.  We dissected down to the tendons.  Using a rongeur and a sharp blade, we removed as much necrotic tissue as we could, releasing any  pocket of grossly purulent material.  We did see a lot of gross purulence.  Of note, he had good bleeding of this tissue, which was a pleasing for the potential for wound healing.  Once we had removed all the necrotic tissue that we could see, we then  thoroughly irrigated the plantar aspect of his left foot with pulsatile lavage using normal saline solution.  After we irrigated 3 liters normal saline through the wound and felt it was adequately clean and decompressed, we then packed it with wet to dry  dressing of Kerlix in the wound itself and then abundant layers of compressive dressing around this.  We did place dressing around his right great toe.  He was taken to recovery room in stable condition.  All final counts were correct.  There were no complications noted.    Postoperatively, he will likely need significant wound care of both feet and potentially even return to the operating room in 48-72 hours.  We will need hopefully the physical therapy wound service for hydrotherapy while he was in the hospital as well.  AN/NUANCE  D:06/10/2018 T:06/11/2018 JOB:[id number]

## 2018-06-12 ENCOUNTER — Encounter (HOSPITAL_COMMUNITY)
Admission: EM | Disposition: A | Discharge status: Home / Self Care | Payer: Self-pay | Source: Ambulatory Visit | Attending: Internal Medicine

## 2018-06-12 ENCOUNTER — Inpatient Hospital Stay (HOSPITAL_COMMUNITY): Payer: BLUE CROSS/BLUE SHIELD | Admitting: Certified Registered"

## 2018-06-12 ENCOUNTER — Encounter (HOSPITAL_COMMUNITY): Payer: Self-pay | Admitting: Certified Registered"

## 2018-06-12 HISTORY — PX: I&D EXTREMITY: SHX5045

## 2018-06-12 HISTORY — PX: APPLICATION OF WOUND VAC: SHX5189

## 2018-06-12 LAB — APTT
aPTT: 42 seconds — ABNORMAL HIGH (ref 24–36)
aPTT: 43 seconds — ABNORMAL HIGH (ref 24–36)

## 2018-06-12 LAB — CBC
HEMATOCRIT: 25.5 % — AB (ref 39.0–52.0)
Hemoglobin: 7.8 g/dL — ABNORMAL LOW (ref 13.0–17.0)
MCH: 28 pg (ref 26.0–34.0)
MCHC: 30.6 g/dL (ref 30.0–36.0)
MCV: 91.4 fL (ref 80.0–100.0)
Platelets: 405 10*3/uL — ABNORMAL HIGH (ref 150–400)
RBC: 2.79 MIL/uL — ABNORMAL LOW (ref 4.22–5.81)
RDW: 16.9 % — AB (ref 11.5–15.5)
WBC: 9.4 10*3/uL (ref 4.0–10.5)
nRBC: 0.4 % — ABNORMAL HIGH (ref 0.0–0.2)

## 2018-06-12 LAB — HEPARIN LEVEL (UNFRACTIONATED)
Heparin Unfractionated: 0.72 IU/mL — ABNORMAL HIGH (ref 0.30–0.70)
Heparin Unfractionated: 0.57 IU/mL (ref 0.30–0.70)

## 2018-06-12 LAB — GLUCOSE, CAPILLARY
Glucose-Capillary: 137 mg/dL — ABNORMAL HIGH (ref 70–99)
Glucose-Capillary: 105 mg/dL — ABNORMAL HIGH (ref 70–99)
Glucose-Capillary: 110 mg/dL — ABNORMAL HIGH (ref 70–99)
Glucose-Capillary: 171 mg/dL — ABNORMAL HIGH (ref 70–99)
Glucose-Capillary: 152 mg/dL — ABNORMAL HIGH (ref 70–99)

## 2018-06-12 LAB — BASIC METABOLIC PANEL
Anion gap: 11 (ref 5–15)
BUN: 75 mg/dL — ABNORMAL HIGH (ref 6–20)
CALCIUM: 7.7 mg/dL — AB (ref 8.9–10.3)
CO2: 17 mmol/L — ABNORMAL LOW (ref 22–32)
Chloride: 108 mmol/L (ref 98–111)
Creatinine, Ser: 4.55 mg/dL — ABNORMAL HIGH (ref 0.61–1.24)
GFR calc Af Amer: 17 mL/min — ABNORMAL LOW (ref >60)
GFR calc non Af Amer: 15 mL/min — ABNORMAL LOW (ref >60)
Glucose, Bld: 125 mg/dL — ABNORMAL HIGH (ref 70–99)
Potassium: 4.8 mmol/L (ref 3.5–5.1)
Sodium: 136 mmol/L (ref 135–145)

## 2018-06-12 SURGERY — IRRIGATION AND DEBRIDEMENT EXTREMITY
Anesthesia: Monitor Anesthesia Care | Site: Foot | Laterality: Left

## 2018-06-12 MED ORDER — LIDOCAINE-EPINEPHRINE (PF) 1.5 %-1:200000 IJ SOLN
INTRAMUSCULAR | Status: DC | PRN
  Administered 2018-06-12: 20 mL via PERINEURAL

## 2018-06-12 MED ORDER — PROPOFOL 500 MG/50ML IV EMUL
INTRAVENOUS | Status: DC | PRN
  Administered 2018-06-12: 40 ug/kg/min via INTRAVENOUS

## 2018-06-12 MED ORDER — SODIUM CHLORIDE 0.9 % IV SOLN
INTRAVENOUS | Status: DC
  Administered 2018-06-12: 08:00:00 via INTRAVENOUS

## 2018-06-12 MED ORDER — MIDAZOLAM HCL 2 MG/2ML IJ SOLN
INTRAMUSCULAR | Status: AC
  Filled 2018-06-12: qty 2

## 2018-06-12 MED ORDER — MIDAZOLAM HCL 5 MG/5ML IJ SOLN
INTRAMUSCULAR | Status: DC | PRN
  Administered 2018-06-12 (×2): 1 mg via INTRAVENOUS

## 2018-06-12 MED ORDER — PROPOFOL 1000 MG/100ML IV EMUL
INTRAVENOUS | Status: AC
  Filled 2018-06-12: qty 100

## 2018-06-12 MED ORDER — PHENYLEPHRINE 40 MCG/ML (10ML) SYRINGE FOR IV PUSH (FOR BLOOD PRESSURE SUPPORT)
PREFILLED_SYRINGE | INTRAVENOUS | Status: DC | PRN
  Administered 2018-06-12: 80 ug via INTRAVENOUS
  Administered 2018-06-12: 120 ug via INTRAVENOUS

## 2018-06-12 MED ORDER — LIDOCAINE 2% (20 MG/ML) 5 ML SYRINGE
INTRAMUSCULAR | Status: DC | PRN
  Administered 2018-06-12: 40 mg via INTRAVENOUS

## 2018-06-12 MED ORDER — LIDOCAINE 2% (20 MG/ML) 5 ML SYRINGE
INTRAMUSCULAR | Status: AC
  Filled 2018-06-12: qty 5

## 2018-06-12 MED ORDER — ONDANSETRON HCL 4 MG/2ML IJ SOLN
INTRAMUSCULAR | Status: DC | PRN
  Administered 2018-06-12: 4 mg via INTRAVENOUS

## 2018-06-12 MED ORDER — EPHEDRINE SULFATE-NACL 50-0.9 MG/10ML-% IV SOSY
PREFILLED_SYRINGE | INTRAVENOUS | Status: DC | PRN
  Administered 2018-06-12 (×2): 10 mg via INTRAVENOUS

## 2018-06-12 MED ORDER — ROPIVACAINE HCL 5 MG/ML IJ SOLN
INTRAMUSCULAR | Status: DC | PRN
  Administered 2018-06-12: 10 mL via PERINEURAL

## 2018-06-12 MED ORDER — PHENYLEPHRINE 40 MCG/ML (10ML) SYRINGE FOR IV PUSH (FOR BLOOD PRESSURE SUPPORT)
PREFILLED_SYRINGE | INTRAVENOUS | Status: AC
  Filled 2018-06-12: qty 10

## 2018-06-12 MED ORDER — SODIUM CHLORIDE 0.9 % IR SOLN
Status: DC | PRN
  Administered 2018-06-12: 3000 mL
  Administered 2018-06-12: 1000 mL

## 2018-06-12 MED ORDER — DEXAMETHASONE SODIUM PHOSPHATE 10 MG/ML IJ SOLN
INTRAMUSCULAR | Status: AC
  Filled 2018-06-12: qty 1

## 2018-06-12 MED ORDER — ONDANSETRON HCL 4 MG/2ML IJ SOLN
INTRAMUSCULAR | Status: AC
  Filled 2018-06-12: qty 2

## 2018-06-12 MED ORDER — LACTATED RINGERS IV SOLN: INTRAVENOUS | Status: DC | PRN

## 2018-06-12 MED ORDER — FENTANYL CITRATE (PF) 100 MCG/2ML IJ SOLN
INTRAMUSCULAR | Status: AC
  Filled 2018-06-12: qty 2

## 2018-06-12 MED ORDER — PROPOFOL 10 MG/ML IV BOLUS
INTRAVENOUS | Status: DC | PRN
  Administered 2018-06-12: 20 mg via INTRAVENOUS

## 2018-06-12 MED ORDER — LACTATED RINGERS IV SOLN: INTRAVENOUS | Status: DC

## 2018-06-12 MED ORDER — SODIUM CHLORIDE 0.9 % IV SOLN
1.0000 g | INTRAVENOUS | Status: DC
  Administered 2018-06-12 – 2018-06-14 (×3): 1 g via INTRAVENOUS
  Filled 2018-06-12 (×3): qty 10

## 2018-06-12 MED ORDER — FENTANYL CITRATE (PF) 100 MCG/2ML IJ SOLN
INTRAMUSCULAR | Status: DC | PRN
  Administered 2018-06-12: 50 ug via INTRAVENOUS

## 2018-06-12 MED ORDER — EPHEDRINE 5 MG/ML INJ
INTRAVENOUS | Status: AC
  Filled 2018-06-12: qty 10

## 2018-06-12 MED ORDER — DEXAMETHASONE SODIUM PHOSPHATE 4 MG/ML IJ SOLN
INTRAMUSCULAR | Status: DC | PRN
  Administered 2018-06-12: 4 mg via INTRAVENOUS

## 2018-06-12 MED ORDER — FENTANYL CITRATE (PF) 250 MCG/5ML IJ SOLN
INTRAMUSCULAR | Status: AC
  Filled 2018-06-12: qty 5

## 2018-06-12 MED ORDER — SODIUM CHLORIDE 0.9 % IV SOLN
INTRAVENOUS | Status: DC | PRN
  Administered 2018-06-12: 08:00:00 via INTRAVENOUS

## 2018-06-12 SURGICAL SUPPLY — 52 items
BANDAGE ACE 3X5.8 VEL STRL LF (GAUZE/BANDAGES/DRESSINGS) IMPLANT
BANDAGE ACE 4X5 VEL STRL LF (GAUZE/BANDAGES/DRESSINGS) ×3 IMPLANT
BLADE SURG 10 STRL SS (BLADE) IMPLANT
BNDG COHESIVE 4X5 TAN STRL (GAUZE/BANDAGES/DRESSINGS) IMPLANT
BNDG COHESIVE 6X5 TAN STRL LF (GAUZE/BANDAGES/DRESSINGS) IMPLANT
CANISTER WOUND CARE 500ML ATS (WOUND CARE) ×3 IMPLANT
COVER SURGICAL LIGHT HANDLE (MISCELLANEOUS) ×3 IMPLANT
COVER WAND RF STERILE (DRAPES) IMPLANT
CUFF TOURNIQUET SINGLE 18IN (TOURNIQUET CUFF) IMPLANT
CUFF TOURNIQUET SINGLE 34IN LL (TOURNIQUET CUFF) IMPLANT
DRAPE INCISE IOBAN 66X45 STRL (DRAPES) ×3 IMPLANT
DRAPE ORTHO SPLIT 77X108 STRL (DRAPES)
DRAPE SURG 17X23 STRL (DRAPES) IMPLANT
DRAPE SURG ORHT 6 SPLT 77X108 (DRAPES) IMPLANT
DRAPE U-SHAPE 47X51 STRL (DRAPES) ×3 IMPLANT
DRSG VAC ATS SM SENSATRAC (GAUZE/BANDAGES/DRESSINGS) ×3 IMPLANT
DURAPREP 26ML APPLICATOR (WOUND CARE) IMPLANT
ELECT REM PT RETURN 9FT ADLT (ELECTROSURGICAL) ×3
ELECTRODE REM PT RTRN 9FT ADLT (ELECTROSURGICAL) ×1 IMPLANT
GAUZE SPONGE 4X4 12PLY STRL (GAUZE/BANDAGES/DRESSINGS) IMPLANT
GAUZE XEROFORM 1X8 LF (GAUZE/BANDAGES/DRESSINGS) IMPLANT
GLOVE BIO SURGEON STRL SZ8 (GLOVE) ×3 IMPLANT
GLOVE BIOGEL PI IND STRL 8 (GLOVE) ×1 IMPLANT
GLOVE BIOGEL PI INDICATOR 8 (GLOVE) ×2
GLOVE ORTHO TXT STRL SZ7.5 (GLOVE) ×3 IMPLANT
GOWN STRL REUS W/ TWL LRG LVL3 (GOWN DISPOSABLE) ×2 IMPLANT
GOWN STRL REUS W/ TWL XL LVL3 (GOWN DISPOSABLE) ×1 IMPLANT
GOWN STRL REUS W/TWL LRG LVL3 (GOWN DISPOSABLE) ×4
GOWN STRL REUS W/TWL XL LVL3 (GOWN DISPOSABLE) ×2
HANDPIECE INTERPULSE COAX TIP (DISPOSABLE) ×2
KIT BASIN OR (CUSTOM PROCEDURE TRAY) ×3 IMPLANT
KIT TURNOVER KIT B (KITS) ×3 IMPLANT
MANIFOLD NEPTUNE II (INSTRUMENTS) ×3 IMPLANT
NS IRRIG 1000ML POUR BTL (IV SOLUTION) ×3 IMPLANT
PACK ORTHO EXTREMITY (CUSTOM PROCEDURE TRAY) ×3 IMPLANT
PAD ARMBOARD 7.5X6 YLW CONV (MISCELLANEOUS) ×6 IMPLANT
PADDING CAST ABS 4INX4YD NS (CAST SUPPLIES)
PADDING CAST ABS COTTON 4X4 ST (CAST SUPPLIES) IMPLANT
PADDING CAST COTTON 6X4 STRL (CAST SUPPLIES) IMPLANT
SET HNDPC FAN SPRY TIP SCT (DISPOSABLE) ×1 IMPLANT
SPONGE LAP 18X18 X RAY DECT (DISPOSABLE) IMPLANT
STOCKINETTE IMPERVIOUS 9X36 MD (GAUZE/BANDAGES/DRESSINGS) IMPLANT
SUT ETHILON 2 0 FS 18 (SUTURE) IMPLANT
SUT ETHILON 2 0 PSLX (SUTURE) ×6 IMPLANT
SUT ETHILON 3 0 PS 1 (SUTURE) IMPLANT
SWAB CULTURE ESWAB REG 1ML (MISCELLANEOUS) IMPLANT
TOWEL OR 17X24 6PK STRL BLUE (TOWEL DISPOSABLE) ×3 IMPLANT
TOWEL OR 17X26 10 PK STRL BLUE (TOWEL DISPOSABLE) ×3 IMPLANT
TUBE CONNECTING 12'X1/4 (SUCTIONS) ×1
TUBE CONNECTING 12X1/4 (SUCTIONS) ×2 IMPLANT
UNDERPAD 30X30 (UNDERPADS AND DIAPERS) ×3 IMPLANT
YANKAUER SUCT BULB TIP NO VENT (SUCTIONS) ×3 IMPLANT

## 2018-06-12 NOTE — Progress Notes (Signed)
[place] for Heparin Indication: atrial fibrillation  No Known Allergies  Patient Measurements: Height: 5\' 11"  (180.3 cm) Weight: 296 lb 8.3 oz (134.5 kg) IBW/kg (Calculated) : 75.3 Heparin Dosing Weight: 106.2  Vital Signs: Temp: 98.9 F (37.2 C) (12/23 2033) Temp Source: Oral (12/23 2033) BP: 128/87 (12/23 2033) Pulse Rate: 72 (12/23 2033)  Labs: Recent Labs    06/09/18 1756 06/10/18 0338 06/11/18 0240 06/12/18 0312  HGB 10.5* 7.8* 8.1* 7.8*  HCT 31.0* 24.8* 26.1* 25.5*  PLT  --  423* 414* 405*  APTT  --   --   --  42*  HEPARINUNFRC  --   --   --  0.72*  CREATININE 4.40* 4.28* 4.19*  --     Estimated Creatinine Clearance: 32.5 mL/min (A) (by C-G formula based on SCr of 4.19 mg/dL (H)).   Medical History: Past Medical History:  Diagnosis Date  . 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 Diabetes mellitus    adult-onset, type 2  . Exogenous obesity   . GERD (gastroesophageal reflux disease)   . Hypertension     Assessment: Pt is a 42 YO M on Eliquis PTA for Aflutter. Last dose taken 06/09/18 at 2349. Held for I&Ds. Now close to 48 hrs after last Eliquis dose, to transition to heparin bridge tonight. Hgb 8.1, plts 414.  12/24 AM update: aPTT is low at 42, using aPTT to dose given apixaban influence on heparin levels, no issues per RN. Hgb low but stable x 3 days.  Goal of Therapy:  APTT 66-102 secs Heparin level 0.3-0.7 units/mL Monitor platelets by anticoagulation protocol: Yes   Plan:  Continue to hold [employer] heparin to 1650 units/hr Monitor daily heparin level / aPTT, CBC, s/s of bleed  Narda Bonds, PharmD, BCPS Clinical Pharmacist Phone: [phone]

## 2018-06-12 NOTE — Op Note (Signed)
NAME: Joshua Massey, VANTERPOOL MEDICAL RECORD [id number] ACCOUNT [id number] DATE OF BIRTH:July 08, 1975 FACILITY: [facility] LOCATION: [facility] PHYSICIAN:Kassady Laboy [place], MD  OPERATIVE REPORT  DATE OF PROCEDURE:  06/12/2018  PREOPERATIVE DIAGNOSIS:  Left diabetic foot infection with abscess, status post irrigation and debridement x1.  POSTOPERATIVE DIAGNOSIS:  Left diabetic foot infection with abscess, status post irrigation and debridement x1.  PROCEDURE: 1.  Repeat irrigation and debridement of left foot with mainly irrigation of the soft tissues with minimal excisional debridement of necrotic fascia and skin. 2.  Delayed wound closure with suture. 3.  Placement of incisional vacuum-assisted closure on the plantar aspect of the left foot.  SURGEON:  Lind Guest. Ninfa Linden, MD  ANESTHESIA:  Left lower extremity regional block with light sedation.  ESTIMATED BLOOD LOSS:  Minimal.  COMPLICATIONS:  None.  INDICATIONS:  The patient is a 42 year old diabetic with multiple medical problems, so we took him to the operating room on Sunday afternoon after he presented Saturday to the Emergency Room with a left foot infection.  In the OR on Sunday, we were able  to ellipse out an area of skin and necrotic tissue of his foot down to the deep plantar aspect.  We thoroughly irrigated and debrided.  He is presenting now for a second look at his foot.  He understands that there is always a risk that he may lose his  foot.  He also understands this is a significant infection.  DESCRIPTION OF PROCEDURE:  After informed consent was obtained and appropriate left foot was marked, he was brought to the operating room and placed supine on the operating table.  Of note, in the holding room, a regional popliteal block was obtained.   He was placed supine on the operating table, and we took the dressing off his foot.  We saw a nice bleeding viable tissue with no gross purulence and no malodorous areas.  We  prepped the foot with Betadine scrub and paint.  Timeout was called to identify  correct patient, correct left foot.  I then used a #10 blade and a rongeur to remove minimal necrotic tissue from the foot.  This was more of just an irrigation of the foot wound.  We used 3 L of pulsatile lavage solution to thoroughly irrigate the  wound.  We had good granulation tissue and no exposed bone.  I was able to then using 2-0 nylon suture in a far-near-near-far format to loosely reapproximate the wound to bring it back together.  I then placed an incisional VAC on top of this incision  and set it to suction and got a good seal at -125 mm of pressure.  We felt good about this, so we placed an Ace wrap around the foot, and he was taken to recovery room in stable condition.  All final counts were correct.  No complications noted.   Postoperatively, I am going to leave him in the hospital for several days to allow the wound to mature in the [facility] and hopefully can remove the [facility] and send him home without any type of VAC placement.  LN/NUANCE  D:06/12/2018 T:06/12/2018 JOB:[id number]

## 2018-06-12 NOTE — Progress Notes (Signed)
[place] for Heparin Indication: atrial fibrillation  No Known Allergies  Patient Measurements: Height: 5\' 11"  (180.3 cm) Weight: 296 lb 8.3 oz (134.5 kg) IBW/kg (Calculated) : 75.3 Heparin Dosing Weight: 106.2  Vital Signs: Temp: 97.7 F (36.5 C) (12/24 1022) Temp Source: Oral (12/24 0535) BP: 108/82 (12/24 1022) Pulse Rate: 65 (12/24 1022)  Labs: Recent Labs    06/10/18 0338 06/11/18 0240 06/12/18 0312 06/12/18 1433  HGB 7.8* 8.1* 7.8*  --   HCT 24.8* 26.1* 25.5*  --   PLT 423* 414* 405*  --   APTT  --   --  42* 43*  HEPARINUNFRC  --   --  0.72* 0.57  CREATININE 4.28* 4.19* 4.55*  --     Estimated Creatinine Clearance: 29.9 mL/min (A) (by C-G formula based on SCr of 4.55 mg/dL (H)).   Medical History: Past Medical History:  Diagnosis Date  . 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 Diabetes mellitus    adult-onset, type 2  . Exogenous obesity   . GERD (gastroesophageal reflux disease)   . Hypertension     Assessment: Pt is a 42 YO M on Eliquis PTA for Aflutter. Last dose taken 06/09/18 at 2349. Held for I&Ds. Now close to 48 hrs after last Eliquis dose, now on heparin bridge. Hgb 7.8, plts 405 -stable. No bleeding reported. Utilizing aPTT for now.   Repeat heparin level is 0.57 (trending down after recent Eliquis).  Repeat aPTT remains low at 43 after increase to 1650 units/hr.   Goal of Therapy:  APTT 66-102 secs Heparin level 0.3-0.7 units/mL Monitor platelets by anticoagulation protocol: Yes   Plan:  Increase heparin to 1850 units/hr Recheck HL and aPTT in 8 hours Monitor daily heparin level / aPTT, CBC, s/s of bleed  Sloan Leiter, PharmD, BCPS, BCCCP Clinical Pharmacist Clinical phone 06/12/2018 until 3:30PM- #[phone] Please refer to [facility] for [place] numbers 06/12/2018, 3:15 PM

## 2018-06-12 NOTE — Anesthesia Procedure Notes (Signed)
Anesthesia Procedure Image    

## 2018-06-12 NOTE — Anesthesia Postprocedure Evaluation (Signed)
Anesthesia Post Note  Patient: Joshua Massey  Procedure(s) Performed: REPEAT IRRIGATION AND DEBRIDEMENT LEFT FOOT (Left Foot) APPLICATION OF WOUND VAC (Left Foot)     Patient location during evaluation: PACU Anesthesia Type: MAC Level of consciousness: awake and alert Pain management: pain level controlled Vital Signs Assessment: post-procedure vital signs reviewed and stable Respiratory status: spontaneous breathing, nonlabored ventilation, respiratory function stable and patient connected to nasal cannula oxygen Cardiovascular status: stable and blood pressure returned to baseline Postop Assessment: no apparent nausea or vomiting Anesthetic complications: no    Last Vitals:  Vitals:   06/12/18 1011 06/12/18 1022  BP:  108/82  Pulse: 70 65  Resp: 17 13  Temp:  36.5 C  SpO2: 96% 99%    Last Pain:  Vitals:   06/12/18 1100  TempSrc:   PainSc: 0-No pain                 Amerigo Mcglory S

## 2018-06-12 NOTE — Anesthesia Procedure Notes (Signed)
Anesthesia Regional Block: Popliteal block   Pre-Anesthetic Checklist: ,, timeout performed, Correct Patient, Correct Site, Correct Laterality, Correct Procedure, Correct Position, site marked, Risks and benefits discussed,  Surgical consent,  Pre-op evaluation,  At surgeon's request and post-op pain management  Laterality: Left  Prep: chloraprep       Needles:  Injection technique: Single-shot  Needle Type: Echogenic Needle     Needle Length: 9cm      Additional Needles:   Procedures:,,,, ultrasound used (permanent image in chart),,,,  Narrative:  Start time: 06/12/2018 8:38 AM End time: 06/12/2018 8:45 AM Injection made incrementally with aspirations every 5 mL.  Performed by: Personally  Anesthesiologist: Myrtie Soman, MD  Additional Notes: Patient tolerated the procedure well without complications

## 2018-06-12 NOTE — Anesthesia Preprocedure Evaluation (Signed)
Anesthesia Evaluation  Patient identified by MRN, date of birth, ID band Patient awake    Reviewed: Allergy & Precautions, NPO status , Patient's Chart, lab work & pertinent test results  Airway Mallampati: II  TM Distance: >3 FB Neck ROM: Full    Dental no notable dental hx.    Pulmonary neg pulmonary ROS,    Pulmonary exam normal breath sounds clear to auscultation + decreased breath sounds      Cardiovascular hypertension, + Peripheral Vascular Disease and +CHF  Normal cardiovascular exam Rhythm:Regular Rate:Normal     Neuro/Psych negative neurological ROS  negative psych ROS   GI/Hepatic negative GI ROS, Neg liver ROS,   Endo/Other  diabetes  Renal/GU Renal InsufficiencyRenal disease  negative genitourinary   Musculoskeletal negative musculoskeletal ROS (+)   Abdominal (+) + obese,   Peds negative pediatric ROS (+)  Hematology  (+) anemia ,   Anesthesia Other Findings   Reproductive/Obstetrics negative OB ROS                             Anesthesia Physical Anesthesia Plan  ASA: IV  Anesthesia Plan: MAC   Post-op Pain Management:  Regional for Post-op pain   Induction: Intravenous  PONV Risk Score and Plan: 0  Airway Management Planned: Simple Face Mask  Additional Equipment:   Intra-op Plan:   Post-operative Plan:   Informed Consent: I have reviewed the patients History and Physical, chart, labs and discussed the procedure including the risks, benefits and alternatives for the proposed anesthesia with the patient or authorized representative who has indicated his/her understanding and acceptance.   Dental advisory given  Plan Discussed with: CRNA and Surgeon  Anesthesia Plan Comments:         Anesthesia Quick Evaluation

## 2018-06-12 NOTE — Progress Notes (Signed)
PROGRESS NOTE    Joshua Massey  ZOX:[id number] DOB: 07/26/1975 DOA: 06/09/2018 PCP: [name], [name], MD     Brief Narrative:  Joshua Massey is a 42 y.o. male with medical history significant of insulin-dependent diabetes but noncompliant with treatment, nonischemic cardiomyopathy with EF of 20%, chronic kidney disease stage III, Atrial flutter on Eliquis, GERD, hypertension, morbid obesity who apparently noted peeling of the skin of his left foot at the plantar aspect about 5 days ago.  Patient place a Band-Aid over it but when he took it off 2 days ago the whole skin came off.  There was drainage on the bandage.  Went to urgent care center where he was evaluated and found to have significant ulcer of the left foot.  He broke the same foot couple of years ago but that has healed.  He has significant diabetic neuropathy with poor sensation.  Patient has coronary artery disease with previous LAD lesion up to 50%.  He has peripheral vascular disease but no documented ABIs.  In the ER he has bilateral gangrenous lesions of his feet.  On the left it is the whole forefoot at the plantar surface on the right it affects the big toe.  There is also corresponding erythema and edema of both feet.  Orthopedics was consulted and patient is being admitted for treatment. Underwent irrigation and debridement of left plantar foot abscess 12/22.  New events last 24 hours / Subjective: He underwent repeat I&D with wound vac placement this morning. Currently feeling well without physical complaints.   Assessment & Plan:   Principal Problem:   Cellulitis in diabetic foot ([place]) Active Problems:   Non-ischemic cardiomyopathy, EF < 20%   Obesity   Type 2 diabetes mellitus with vascular disease (HCC)   CKD (chronic kidney disease), stage III (HCC)   Hyperlipidemia associated with type 2 diabetes mellitus ([place])   Diabetic foot ulcer ([place])   Non-compliant patient   Bilateral diabetic foot ulcers with left  plantar foot abscess  -MRI left foot shows severe arthropathy, previous fracture/dislocation, first metatarsal base erosion with draining sinus tract, ulceration plantar medial ball of foot with local necrosis of soft tissues  -S/p irrigation and debridement of left plantar foot abscess 12/22 with Dr. Ninfa Linden -S/p repeat I&D with wound vac placement 12/24 with Dr. Ninfa Linden  -Wound culture showed morganella morganii, sensitive to cephalosporin  -Antibiotics deescalated to Rocephin  -Continue monitoring, possibly remove wound vac prior to discharge home   Type 2 DM with complications including CKD, diabetic foot ulcers -Ha1c 8.5  -Lantus, SSI novolog  -Diabetes coordinator consulted -Blood sugars well controlled   CAD -Lipitor, bidil   CKD stage 4-5  -Baseline Cr 6 months ago ~ 3.3 -Admitted with Cr 4.4 --> 4.28 --> 4.19 --> 4.55 (may be new baseline)  -Remains stable. Trend BMP   Chronic systolic and diastolic HF -EF 81-19% with diffuse hypokinesis, grade 2 diastolic dysfunction -Demadex   Hx A Flutter -Eliquis on hold, pending possible surgical procedure, last dose 12/21 PM  -Amiodarone, coreg   Morbid obesity -Body mass index is 41.36 kg/m.    DVT prophylaxis: Holding eliquis pending repeat procedure  Code Status: Full Family Communication: No family at bedside Disposition Plan: Continue to monitor wound, per ortho    Consultants:   Orthopedic surgery  Procedures:   S/p irrigation and debridement of left plantar foot abscess 12/22 with Dr. Ninfa Linden  S/p repeat I&D with wound vac placement 12/24 with Dr. Ninfa Linden  Antimicrobials:  Anti-infectives (From admission, onward)   Start     Dose/Rate Route Frequency Ordered Stop   06/12/18 1115  cefTRIAXone (ROCEPHIN) 1 g in sodium chloride 0.9 % 100 mL IVPB     1 g 200 mL/hr over 30 Minutes Intravenous Every 24 hours 06/12/18 1111     06/11/18 2000  vancomycin (VANCOCIN) 1,250 mg in sodium chloride 0.9 % 250 mL  IVPB  Status:  Discontinued     1,250 mg 166.7 mL/hr over 90 Minutes Intravenous Every 36 hours 06/11/18 0849 06/12/18 1111   06/09/18 2200  piperacillin-tazobactam (ZOSYN) IVPB 3.375 g  Status:  Discontinued     3.375 g 12.5 mL/hr over 240 Minutes Intravenous Every 12 hours 06/09/18 2033 06/09/18 2137   06/09/18 2200  piperacillin-tazobactam (ZOSYN) IVPB 3.375 g  Status:  Discontinued     3.375 g 12.5 mL/hr over 240 Minutes Intravenous Every 8 hours 06/09/18 2137 06/12/18 1111   06/09/18 2100  piperacillin-tazobactam (ZOSYN) IVPB 3.375 g  Status:  Discontinued     3.375 g 12.5 mL/hr over 240 Minutes Intravenous Every 8 hours 06/09/18 2027 06/09/18 2033   06/09/18 2045  vancomycin (VANCOCIN) 2,000 mg in sodium chloride 0.9 % 500 mL IVPB     2,000 mg 250 mL/hr over 120 Minutes Intravenous  Once 06/09/18 2041 06/10/18 0114   06/09/18 2043  vancomycin variable dose per unstable renal function (pharmacist dosing)  Status:  Discontinued      Does not apply See admin instructions 06/09/18 2043 06/11/18 0849   06/09/18 2000  amoxicillin-clavulanate (AUGMENTIN) 500-125 MG per tablet 500 mg  Status:  Discontinued     1 tablet Oral 2 times daily 06/09/18 1851 06/09/18 2033   06/09/18 1900  ceFAZolin (ANCEF) IVPB 1 g/50 mL premix  Status:  Discontinued     1 g 100 mL/hr over 30 Minutes Intravenous  Once 06/09/18 1846 06/09/18 1847       Objective: Vitals:   06/12/18 0535 06/12/18 1007 06/12/18 1011 06/12/18 1022  BP: 121/88 105/72  108/82  Pulse: 72 68 70 65  Resp: 18 17 17 13   Temp: 98.3 F (36.8 C) 97.9 F (36.6 C)  97.7 F (36.5 C)  TempSrc: Oral     SpO2: 99% 97% 96% 99%  Weight:      Height:        Intake/Output Summary (Last 24 hours) at 06/12/2018 1113 Last data filed at 06/12/2018 1007 Gross per 24 hour  Intake 1760.72 ml  Output 700 ml  Net 1060.72 ml   Filed Weights   06/09/18 2035  Weight: 134.5 kg    Examination: General exam: Appears calm and comfortable    Respiratory system: Clear to auscultation. Respiratory effort normal. Cardiovascular system: S1 & S2 heard, RRR. No JVD, murmurs, rubs, gallops or clicks. No pedal edema. Gastrointestinal system: Abdomen is nondistended, soft and nontender. No organomegaly or masses felt. Normal bowel sounds heard. Central nervous system: Alert and oriented. No focal neurological deficits. Extremities: +Left foot in dressing with wound vac in place  Psychiatry: Judgement and insight appear normal. Mood & affect appropriate.    Data Reviewed: I have personally reviewed following labs and imaging studies  CBC: Recent Labs  Lab 06/09/18 1748 06/09/18 1756 06/10/18 0338 06/11/18 0240 06/12/18 0312  WBC 9.9  --  10.1 9.7 9.4  HGB 9.1* 10.5* 7.8* 8.1* 7.8*  HCT 29.9* 31.0* 24.8* 26.1* 25.5*  MCV 90.6  --  89.9 92.6 91.4  PLT 466*  --  423* 414* 378*   Basic Metabolic Panel: Recent Labs  Lab 06/09/18 1756 06/10/18 0338 06/11/18 0240 06/12/18 0312  NA 140 137 141 136  K 5.1 4.8 5.2* 4.8  CL 111 109 110 108  CO2  --  18* 17* 17*  GLUCOSE 181* 156* 107* 125*  BUN 77* 82* 77* 75*  CREATININE 4.40* 4.28* 4.19* 4.55*  CALCIUM  --  8.0* 7.7* 7.7*   GFR: Estimated Creatinine Clearance: 29.9 mL/min (A) (by C-G formula based on SCr of 4.55 mg/dL (H)). Liver Function Tests: Recent Labs  Lab 06/10/18 0338  AST 16  ALT 28  ALKPHOS 211*  BILITOT 1.3*  PROT 6.8  ALBUMIN 1.7*   No results for input(s): LIPASE, AMYLASE in the last 168 hours. No results for input(s): AMMONIA in the last 168 hours. Coagulation Profile: No results for input(s): INR, PROTIME in the last 168 hours. Cardiac Enzymes: No results for input(s): CKTOTAL, CKMB, CKMBINDEX, TROPONINI in the last 168 hours. BNP (last 3 results) No results for input(s): PROBNP in the last 8760 hours. HbA1C: Recent Labs    06/10/18 0338  HGBA1C 8.5*   CBG: Recent Labs  Lab 06/11/18 1135 06/11/18 1630 06/11/18 2118 06/12/18 0640  06/12/18 1013  GLUCAP 129* 118* 146* 137* 105*   Lipid Profile: No results for input(s): CHOL, HDL, LDLCALC, TRIG, CHOLHDL, LDLDIRECT in the last 72 hours. Thyroid Function Tests: No results for input(s): TSH, T4TOTAL, FREET4, T3FREE, THYROIDAB in the last 72 hours. Anemia Panel: No results for input(s): VITAMINB12, FOLATE, FERRITIN, TIBC, IRON, RETICCTPCT in the last 72 hours. Sepsis Labs: No results for input(s): PROCALCITON, LATICACIDVEN in the last 168 hours.  Recent Results (from the past 240 hour(s))  Surgical PCR screen     Status: Abnormal   Collection Time: 06/10/18  9:55 AM  Result Value Ref Range Status   MRSA, PCR NEGATIVE NEGATIVE Final   Staphylococcus aureus POSITIVE (A) NEGATIVE Final    Comment: (NOTE) The Xpert SA Assay (FDA approved for NASAL specimens in patients 39 years of age and older), is one component of a comprehensive surveillance program. It is not intended to diagnose infection nor to guide or monitor treatment.   Aerobic/Anaerobic Culture (surgical/deep wound)     Status: None (Preliminary result)   Collection Time: 06/10/18  2:15 PM  Result Value Ref Range Status   Specimen Description ABSCESS LEFT,FOOT  Final   Special Requests NONE  Final   Gram Stain   Final    RARE WBC PRESENT, PREDOMINANTLY PMN FEW GRAM POSITIVE COCCI IN PAIRS RARE GRAM NEGATIVE RODS Performed at [facility], [place] [address], [place], [place] [address]    Culture FEW [facility] MORGANII  Final   Report Status PENDING  Incomplete   Organism ID, Bacteria MORGANELLA MORGANII  Final      Susceptibility   Morganella morganii - MIC*    AMPICILLIN >=32 RESISTANT Resistant     CEFAZOLIN >=64 RESISTANT Resistant     CEFEPIME <=1 SENSITIVE Sensitive     CEFTAZIDIME <=1 SENSITIVE Sensitive     CEFTRIAXONE <=1 SENSITIVE Sensitive     CIPROFLOXACIN <=0.25 SENSITIVE Sensitive     GENTAMICIN <=1 SENSITIVE Sensitive     IMIPENEM 1 SENSITIVE Sensitive     TRIMETH/SULFA >=320  RESISTANT Resistant     AMPICILLIN/SULBACTAM >=32 RESISTANT Resistant     PIP/TAZO <=4 SENSITIVE Sensitive     * FEW MORGANELLA MORGANII       Radiology Studies: No results found.  Scheduled Meds: . amiodarone  200 mg Oral BID  . atorvastatin  10 mg Oral Q supper  . carvedilol  25 mg Oral BID WC  . ferrous sulfate  325 mg Oral BID WC  . fluticasone  1 spray Each Nare Daily  . insulin aspart  0-20 Units Subcutaneous TID WC  . insulin aspart  0-5 Units Subcutaneous QHS  . insulin glargine  5 Units Subcutaneous QHS  . isosorbide-hydrALAZINE  1 tablet Oral TID  . mupirocin ointment  1 application Nasal BID  . pantoprazole  40 mg Oral QAC breakfast  . torsemide  60 mg Oral Daily   Continuous Infusions: . sodium chloride 10 mL/hr at 06/12/18 0820  . cefTRIAXone (ROCEPHIN)  IV    . heparin 1,650 Units/hr (06/12/18 0600)     LOS: 3 days    Time spent: 25 minutes   Dessa Phi, DO [facility] [email or web address] Password [email or web address] 06/12/2018, 11:13 AM

## 2018-06-12 NOTE — Brief Op Note (Signed)
06/12/2018  10:06 AM  PATIENT:  [name]  42 y.o. male  PRE-OPERATIVE DIAGNOSIS:  infection, left foot  POST-OPERATIVE DIAGNOSIS:  infection, left foot  PROCEDURE:  Procedure(s): REPEAT IRRIGATION AND DEBRIDEMENT LEFT FOOT (Left) APPLICATION OF WOUND VAC (Left)  SURGEON:  Surgeon(s) and Role:    Mcarthur Rossetti, MD - Primary  ANESTHESIA:   regional and IV sedation  COUNTS:  YES  TOURNIQUET:  * No tourniquets in log *  DICTATION: .Other Dictation: Dictation Number [id number]  PLAN OF CARE: Admit to inpatient   PATIENT DISPOSITION:  PACU - hemodynamically stable.   Delay start of Pharmacological VTE agent (>24hrs) due to surgical blood loss or risk of bleeding: no

## 2018-06-12 NOTE — Plan of Care (Signed)

## 2018-06-12 NOTE — Anesthesia Procedure Notes (Signed)
Procedure Name: MAC Date/Time: 06/12/2018 9:37 AM Performed by: Orlie Dakin, CRNA Pre-anesthesia Checklist: Patient identified, Emergency Drugs available, Suction available and Patient being monitored Patient Re-evaluated:Patient Re-evaluated prior to induction Oxygen Delivery Method: Nasal cannula Preoxygenation: Pre-oxygenation with 100% oxygen

## 2018-06-12 NOTE — Transfer of Care (Signed)
Immediate Anesthesia Transfer of Care Note  Patient: Joshua Massey  Procedure(s) Performed: REPEAT IRRIGATION AND DEBRIDEMENT LEFT FOOT (Left Foot) APPLICATION OF WOUND VAC (Left Foot)  Patient Location: PACU  Anesthesia Type:MAC and Regional  Level of Consciousness: awake, oriented and patient cooperative  Airway & Oxygen Therapy: Patient Spontanous Breathing  Post-op Assessment: Report given to RN, Post -op Vital signs reviewed and stable and Patient moving all extremities X 4  Post vital signs: Reviewed and stable  Last Vitals:  Vitals Value Taken Time  BP    Temp    Pulse    Resp    SpO2      Last Pain:  Vitals:   06/12/18 0535  TempSrc: Oral  PainSc:          Complications: No apparent anesthesia complications

## 2018-06-12 NOTE — Progress Notes (Signed)
Patient ID: Joshua Massey, male   DOB: December 03, 1975, 42 y.o.   MRN: [id number] The patient understands fully that we are returning to the OR today for a repeat I&D of his left foot due to his significant infection.  All questions and concerns were answered and addressed and informed consent is obtained.

## 2018-06-13 ENCOUNTER — Encounter (HOSPITAL_COMMUNITY): Payer: Self-pay | Admitting: Orthopaedic Surgery

## 2018-06-13 LAB — GLUCOSE, CAPILLARY
Glucose-Capillary: 179 mg/dL — ABNORMAL HIGH (ref 70–99)
Glucose-Capillary: 187 mg/dL — ABNORMAL HIGH (ref 70–99)
Glucose-Capillary: 164 mg/dL — ABNORMAL HIGH (ref 70–99)
Glucose-Capillary: 147 mg/dL — ABNORMAL HIGH (ref 70–99)

## 2018-06-13 LAB — BASIC METABOLIC PANEL
Anion gap: 13 (ref 5–15)
BUN: 79 mg/dL — ABNORMAL HIGH (ref 6–20)
CO2: 17 mmol/L — ABNORMAL LOW (ref 22–32)
Calcium: 7.8 mg/dL — ABNORMAL LOW (ref 8.9–10.3)
Chloride: 108 mmol/L (ref 98–111)
Creatinine, Ser: 4.66 mg/dL — ABNORMAL HIGH (ref 0.61–1.24)
GFR, EST AFRICAN AMERICAN: 17 mL/min — AB (ref >60)
GFR, EST NON AFRICAN AMERICAN: 14 mL/min — AB (ref >60)
Glucose, Bld: 186 mg/dL — ABNORMAL HIGH (ref 70–99)
Potassium: 4.9 mmol/L (ref 3.5–5.1)
Sodium: 138 mmol/L (ref 135–145)

## 2018-06-13 LAB — HEPARIN LEVEL (UNFRACTIONATED)
HEPARIN UNFRACTIONATED: 0.45 [IU]/mL (ref 0.30–0.70)
HEPARIN UNFRACTIONATED: 0.38 [IU]/mL (ref 0.30–0.70)

## 2018-06-13 LAB — CBC
HCT: 28.1 % — ABNORMAL LOW (ref 39.0–52.0)
Hemoglobin: 8.6 g/dL — ABNORMAL LOW (ref 13.0–17.0)
MCH: 28.5 pg (ref 26.0–34.0)
MCHC: 30.6 g/dL (ref 30.0–36.0)
MCV: 93 fL (ref 80.0–100.0)
Platelets: 398 10*3/uL (ref 150–400)
RBC: 3.02 MIL/uL — ABNORMAL LOW (ref 4.22–5.81)
RDW: 17.4 % — ABNORMAL HIGH (ref 11.5–15.5)
WBC: 10.6 10*3/uL — AB (ref 4.0–10.5)
nRBC: 0.3 % — ABNORMAL HIGH (ref 0.0–0.2)

## 2018-06-13 LAB — APTT: aPTT: 50 seconds — ABNORMAL HIGH (ref 24–36)

## 2018-06-13 NOTE — Progress Notes (Signed)
ANTICOAGULATION CONSULT NOTE   Pharmacy Consult for Heparin Indication: atrial fibrillation  No Known Allergies  Patient Measurements: Height: 5\' 11"  (180.3 cm) Weight: 296 lb 8.3 oz (134.5 kg) IBW/kg (Calculated) : 75.3 Heparin Dosing Weight: 106.2  Vital Signs: Temp: 97.7 F (36.5 C) (12/25 0544) Temp Source: Oral (12/25 0544) BP: 122/78 (12/25 0544) Pulse Rate: 75 (12/25 0544)  Labs: Recent Labs    06/11/18 0240  06/12/18 0312 06/12/18 1433 06/12/18 2329 06/13/18 0605  HGB 8.1*  --  7.8*  --   --  8.6*  HCT 26.1*  --  25.5*  --   --  28.1*  PLT 414*  --  405*  --   --  398  APTT  --   --  42* 43* 50*  --   HEPARINUNFRC  --    < > 0.72* 0.57 0.45 0.38  CREATININE 4.19*  --  4.55*  --   --  4.66*   < > = values in this interval not displayed.    Estimated Creatinine Clearance: 29.2 mL/min (A) (by C-G formula based on SCr of 4.66 mg/dL (H)).   Medical History: Past Medical History:  Diagnosis Date  . 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 Diabetes mellitus    adult-onset, type 2  . Exogenous obesity   . GERD (gastroesophageal reflux disease)   . Hypertension     Assessment: Pt is a 42 YO M on Eliquis PTA for Aflutter. Last dose taken 06/09/18 at 2349. Held for I&Ds. Heparin level 0.38 units/ml. CBC stable. No bleeding noted.  Goal of Therapy:  Heparin level 0.3-0.7 units/mL Monitor platelets by anticoagulation protocol: Yes   Plan:  Continue heparin at 1850 units/hr Monitor daily heparin level, CBC, s/s of bleed  Thank you for allowing pharmacy to be a part of this patient's care.  Leron Croak, PharmD PGY1 Pharmacy Resident Phone: [phone]  Please check AMION for all [place] phone numbers

## 2018-06-13 NOTE — Plan of Care (Signed)
  Problem: Education: Goal: Knowledge of General Education information will improve Description: Including pain rating scale, medication(s)/side effects and non-pharmacologic comfort measures Outcome: Progressing   Problem: Health Behavior/Discharge Planning: Goal: Ability to manage health-related needs will improve Outcome: Progressing   Problem: Clinical Measurements: Goal: Ability to maintain clinical measurements within normal limits will improve Outcome: Progressing Goal: Will remain free from infection Outcome: Progressing Goal: Respiratory complications will improve Outcome: Progressing   Problem: Activity: Goal: Risk for activity intolerance will decrease Outcome: Progressing   Problem: Nutrition: Goal: Adequate nutrition will be maintained Outcome: Progressing   Problem: Coping: Goal: Level of anxiety will decrease Outcome: Progressing   

## 2018-06-13 NOTE — Progress Notes (Signed)
ANTICOAGULATION CONSULT NOTE   Pharmacy Consult for Heparin Indication: atrial fibrillation  No Known Allergies  Patient Measurements: Height: 5\' 11"  (180.3 cm) Weight: 296 lb 8.3 oz (134.5 kg) IBW/kg (Calculated) : 75.3 Heparin Dosing Weight: 106.2  Vital Signs: Temp: 98.1 F (36.7 C) (12/24 2341) Temp Source: Oral (12/24 2341) BP: 123/94 (12/24 2341) Pulse Rate: 111 (12/24 2341)  Labs: Recent Labs    06/10/18 0338 06/11/18 0240 06/12/18 0312 06/12/18 1433 06/12/18 2329  HGB 7.8* 8.1* 7.8*  --   --   HCT 24.8* 26.1* 25.5*  --   --   PLT 423* 414* 405*  --   --   APTT  --   --  42* 43* 50*  HEPARINUNFRC  --   --  0.72* 0.57 0.45  CREATININE 4.28* 4.19* 4.55*  --   --     Estimated Creatinine Clearance: 29.9 mL/min (A) (by C-G formula based on SCr of 4.55 mg/dL (H)).   Medical History: Past Medical History:  Diagnosis Date  . 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 Diabetes mellitus    adult-onset, type 2  . Exogenous obesity   . GERD (gastroesophageal reflux disease)   . Hypertension     Assessment: Pt is a 42 YO M on Eliquis PTA for Aflutter. Last dose taken 06/09/18 at 2349. Held for I&Ds.  Heparin level 0.45 units/ml. Okay to use heparin levels to guide therapy now  Goal of Therapy:  Heparin level 0.3-0.7 units/mL Monitor platelets by anticoagulation protocol: Yes   Plan:  Continue heparin at 1850 units/hr Monitor daily heparin level, CBC, s/s of bleed  Thanks for allowing pharmacy to be a part of this patient's care.  Excell Seltzer, PharmD Clinical Pharmacist

## 2018-06-13 NOTE — Progress Notes (Signed)
Patient ID: Joshua Massey, male   DOB: Apr 14, 1976, 42 y.o.   MRN: [id number] Incisional VAC with a good seal on plantar aspect of his left foot.  I would like to leave the [facility] on for another 24-48 hours prior to discharge.  He will not need a VAC for home.

## 2018-06-13 NOTE — Progress Notes (Signed)
PROGRESS NOTE    Joshua Massey  JOI:[id number] DOB: 06-14-1976 DOA: 06/09/2018 PCP: [name], [name], MD     Brief Narrative:  Joshua Massey is a 42 y.o. male with medical history significant of insulin-dependent diabetes but noncompliant with treatment, nonischemic cardiomyopathy with EF of 20%, chronic kidney disease stage III, Atrial flutter on Eliquis, GERD, hypertension, morbid obesity who apparently noted peeling of the skin of his left foot at the plantar aspect about 5 days ago.  Patient place a Band-Aid over it but when he took it off 2 days ago the whole skin came off.  There was drainage on the bandage.  Went to urgent care center where he was evaluated and found to have significant ulcer of the left foot.  He broke the same foot couple of years ago but that has healed.  He has significant diabetic neuropathy with poor sensation.  Patient has coronary artery disease with previous LAD lesion up to 50%.  He has peripheral vascular disease but no documented ABIs.  In the ER he has bilateral gangrenous lesions of his feet.  On the left it is the whole forefoot at the plantar surface on the right it affects the big toe.  There is also corresponding erythema and edema of both feet.  Orthopedics was consulted and patient is being admitted for treatment. Underwent irrigation and debridement of left plantar foot abscess 12/22 and repeat I&D with wound vac placement 12/24.   New events last 24 hours / Subjective: Wondering when he would get discharged and asked about PT evaluation. No physical complaints, no pain or nausea, vomiting.   Assessment & Plan:   Principal Problem:   Cellulitis in diabetic foot ([place]) Active Problems:   Non-ischemic cardiomyopathy, EF < 20%   Obesity   Type 2 diabetes mellitus with vascular disease (HCC)   CKD (chronic kidney disease), stage III (HCC)   Hyperlipidemia associated with type 2 diabetes mellitus ([place])   Diabetic foot ulcer ([place])   Non-compliant  patient   Bilateral diabetic foot ulcers with left plantar foot abscess  -MRI left foot shows severe arthropathy, previous fracture/dislocation, first metatarsal base erosion with draining sinus tract, ulceration plantar medial ball of foot with local necrosis of soft tissues  -S/p irrigation and debridement of left plantar foot abscess 12/22 with Dr. Ninfa Linden -S/p repeat I&D with wound vac placement 12/24 with Dr. Ninfa Linden  -Wound culture showed morganella morganii, sensitive to cephalosporin  -Antibiotics deescalated to Rocephin  -Continue monitoring, possibly remove wound vac prior to discharge home   Type 2 DM with complications including CKD, diabetic foot ulcers -Ha1c 8.5  -Lantus, SSI novolog  -Diabetes coordinator consulted -Blood sugars within acceptable range   CAD -Lipitor, bidil   CKD stage 4-5  -Baseline Cr 6 months ago ~ 3.3 -Now Cr ranging from 4.2-4.6 (may be new baseline)  -Trend BMP   Chronic systolic and diastolic HF -EF 47-09% with diffuse hypokinesis, grade 2 diastolic dysfunction -Demadex   Hx A Flutter -Eliquis on hold, pending possible surgical procedure, last dose 12/21 PM  -Amiodarone, coreg   Morbid obesity -Body mass index is 41.36 kg/m.    DVT prophylaxis: Holding eliquis pending repeat procedure, on IV heparin  Code Status: Full Family Communication: No family at bedside Disposition Plan: Continue to monitor wound, per ortho    Consultants:   Orthopedic surgery  Procedures:   S/p irrigation and debridement of left plantar foot abscess 12/22 with Dr. Ninfa Linden  S/p repeat I&D with  wound vac placement 12/24 with Dr. Ninfa Linden   Antimicrobials:  Anti-infectives (From admission, onward)   Start     Dose/Rate Route Frequency Ordered Stop   06/12/18 1115  cefTRIAXone (ROCEPHIN) 1 g in sodium chloride 0.9 % 100 mL IVPB     1 g 200 mL/hr over 30 Minutes Intravenous Every 24 hours 06/12/18 1111     06/11/18 2000  vancomycin (VANCOCIN)  1,250 mg in sodium chloride 0.9 % 250 mL IVPB  Status:  Discontinued     1,250 mg 166.7 mL/hr over 90 Minutes Intravenous Every 36 hours 06/11/18 0849 06/12/18 1111   06/09/18 2200  piperacillin-tazobactam (ZOSYN) IVPB 3.375 g  Status:  Discontinued     3.375 g 12.5 mL/hr over 240 Minutes Intravenous Every 12 hours 06/09/18 2033 06/09/18 2137   06/09/18 2200  piperacillin-tazobactam (ZOSYN) IVPB 3.375 g  Status:  Discontinued     3.375 g 12.5 mL/hr over 240 Minutes Intravenous Every 8 hours 06/09/18 2137 06/12/18 1111   06/09/18 2100  piperacillin-tazobactam (ZOSYN) IVPB 3.375 g  Status:  Discontinued     3.375 g 12.5 mL/hr over 240 Minutes Intravenous Every 8 hours 06/09/18 2027 06/09/18 2033   06/09/18 2045  vancomycin (VANCOCIN) 2,000 mg in sodium chloride 0.9 % 500 mL IVPB     2,000 mg 250 mL/hr over 120 Minutes Intravenous  Once 06/09/18 2041 06/10/18 0114   06/09/18 2043  vancomycin variable dose per unstable renal function (pharmacist dosing)  Status:  Discontinued      Does not apply See admin instructions 06/09/18 2043 06/11/18 0849   06/09/18 2000  amoxicillin-clavulanate (AUGMENTIN) 500-125 MG per tablet 500 mg  Status:  Discontinued     1 tablet Oral 2 times daily 06/09/18 1851 06/09/18 2033   06/09/18 1900  ceFAZolin (ANCEF) IVPB 1 g/50 mL premix  Status:  Discontinued     1 g 100 mL/hr over 30 Minutes Intravenous  Once 06/09/18 1846 06/09/18 1847       Objective: Vitals:   06/12/18 1022 06/12/18 1948 06/12/18 2341 06/13/18 0544  BP: 108/82 (!) 121/93 (!) 123/94 122/78  Pulse: 65 (!) 115 (!) 111 75  Resp: 13 15 14    Temp: 97.7 F (36.5 C) (!) 97.5 F (36.4 C) 98.1 F (36.7 C) 97.7 F (36.5 C)  TempSrc:  Oral Oral Oral  SpO2: 99% 98% 99% 98%  Weight:      Height:        Intake/Output Summary (Last 24 hours) at 06/13/2018 0814 Last data filed at 06/13/2018 0300 Gross per 24 hour  Intake 804.13 ml  Output 600 ml  Net 204.13 ml   Filed Weights   06/09/18  2035  Weight: 134.5 kg    Examination: General exam: Appears calm and comfortable  Respiratory system: Clear to auscultation. Respiratory effort normal. Cardiovascular system: S1 & S2 heard, RRR. No JVD, murmurs, rubs, gallops or clicks. No pedal edema. Gastrointestinal system: Abdomen is nondistended, soft and nontender. No organomegaly or masses felt. Normal bowel sounds heard. Central nervous system: Alert and oriented. No focal neurological deficits. Extremities: +Left foot in clean and dry dressing with wound vac in place, right great toe wrapped in clean and dry dressing  Psychiatry: Judgement and insight appear normal. Mood & affect appropriate.    Data Reviewed: I have personally reviewed following labs and imaging studies  CBC: Recent Labs  Lab 06/09/18 1748 06/09/18 1756 06/10/18 0338 06/11/18 0240 06/12/18 0312 06/13/18 0605  WBC 9.9  --  10.1  9.7 9.4 10.6*  HGB 9.1* 10.5* 7.8* 8.1* 7.8* 8.6*  HCT 29.9* 31.0* 24.8* 26.1* 25.5* 28.1*  MCV 90.6  --  89.9 92.6 91.4 93.0  PLT 466*  --  423* 414* 405* 774   Basic Metabolic Panel: Recent Labs  Lab 06/09/18 1756 06/10/18 0338 06/11/18 0240 06/12/18 0312 06/13/18 0605  NA 140 137 141 136 138  K 5.1 4.8 5.2* 4.8 4.9  CL 111 109 110 108 108  CO2  --  18* 17* 17* 17*  GLUCOSE 181* 156* 107* 125* 186*  BUN 77* 82* 77* 75* 79*  CREATININE 4.40* 4.28* 4.19* 4.55* 4.66*  CALCIUM  --  8.0* 7.7* 7.7* 7.8*   GFR: Estimated Creatinine Clearance: 29.2 mL/min (A) (by C-G formula based on SCr of 4.66 mg/dL (H)). Liver Function Tests: Recent Labs  Lab 06/10/18 0338  AST 16  ALT 28  ALKPHOS 211*  BILITOT 1.3*  PROT 6.8  ALBUMIN 1.7*   No results for input(s): LIPASE, AMYLASE in the last 168 hours. No results for input(s): AMMONIA in the last 168 hours. Coagulation Profile: No results for input(s): INR, PROTIME in the last 168 hours. Cardiac Enzymes: No results for input(s): CKTOTAL, CKMB, CKMBINDEX, TROPONINI in  the last 168 hours. BNP (last 3 results) No results for input(s): PROBNP in the last 8760 hours. HbA1C: No results for input(s): HGBA1C in the last 72 hours. CBG: Recent Labs  Lab 06/12/18 1013 06/12/18 1130 06/12/18 1638 06/12/18 2126 06/13/18 0640  GLUCAP 105* 110* 171* 152* 179*   Lipid Profile: No results for input(s): CHOL, HDL, LDLCALC, TRIG, CHOLHDL, LDLDIRECT in the last 72 hours. Thyroid Function Tests: No results for input(s): TSH, T4TOTAL, FREET4, T3FREE, THYROIDAB in the last 72 hours. Anemia Panel: No results for input(s): VITAMINB12, FOLATE, FERRITIN, TIBC, IRON, RETICCTPCT in the last 72 hours. Sepsis Labs: No results for input(s): PROCALCITON, LATICACIDVEN in the last 168 hours.  Recent Results (from the past 240 hour(s))  Surgical PCR screen     Status: Abnormal   Collection Time: 06/10/18  9:55 AM  Result Value Ref Range Status   MRSA, PCR NEGATIVE NEGATIVE Final   Staphylococcus aureus POSITIVE (A) NEGATIVE Final    Comment: (NOTE) The Xpert SA Assay (FDA approved for NASAL specimens in patients 20 years of age and older), is one component of a comprehensive surveillance program. It is not intended to diagnose infection nor to guide or monitor treatment.   Aerobic/Anaerobic Culture (surgical/deep wound)     Status: None (Preliminary result)   Collection Time: 06/10/18  2:15 PM  Result Value Ref Range Status   Specimen Description ABSCESS LEFT,FOOT  Final   Special Requests NONE  Final   Gram Stain   Final    RARE WBC PRESENT, PREDOMINANTLY PMN FEW GRAM POSITIVE COCCI IN PAIRS RARE GRAM NEGATIVE RODS Performed at [facility], [place] [address] [place], [place] [address]    Culture FEW [facility] MORGANII  Final   Report Status PENDING  Incomplete   Organism ID, Bacteria MORGANELLA MORGANII  Final      Susceptibility   Morganella morganii - MIC*    AMPICILLIN >=32 RESISTANT Resistant     CEFAZOLIN >=64 RESISTANT Resistant     CEFEPIME <=1  SENSITIVE Sensitive     CEFTAZIDIME <=1 SENSITIVE Sensitive     CEFTRIAXONE <=1 SENSITIVE Sensitive     CIPROFLOXACIN <=0.25 SENSITIVE Sensitive     GENTAMICIN <=1 SENSITIVE Sensitive     IMIPENEM 1 SENSITIVE  Sensitive     TRIMETH/SULFA >=320 RESISTANT Resistant     AMPICILLIN/SULBACTAM >=32 RESISTANT Resistant     PIP/TAZO <=4 SENSITIVE Sensitive     * FEW [facility] MORGANII       Radiology Studies: No results found.    Scheduled Meds: . amiodarone  200 mg Oral BID  . atorvastatin  10 mg Oral Q supper  . carvedilol  25 mg Oral BID WC  . ferrous sulfate  325 mg Oral BID WC  . fluticasone  1 spray Each Nare Daily  . insulin aspart  0-20 Units Subcutaneous TID WC  . insulin aspart  0-5 Units Subcutaneous QHS  . insulin glargine  5 Units Subcutaneous QHS  . isosorbide-hydrALAZINE  1 tablet Oral TID  . mupirocin ointment  1 application Nasal BID  . pantoprazole  40 mg Oral QAC breakfast  . torsemide  60 mg Oral Daily   Continuous Infusions: . sodium chloride 10 mL/hr at 06/12/18 0820  . cefTRIAXone (ROCEPHIN)  IV Stopped (06/12/18 1320)  . heparin 1,850 Units/hr (06/13/18 0705)     LOS: 4 days    Time spent: 20 minutes   Dessa Phi, DO [facility] [email or web address] Password [email or web address] 06/13/2018, 8:14 AM

## 2018-06-14 ENCOUNTER — Inpatient Hospital Stay (HOSPITAL_COMMUNITY): Payer: BLUE CROSS/BLUE SHIELD

## 2018-06-14 DIAGNOSIS — L97429 Non-pressure chronic ulcer of left heel and midfoot with unspecified severity: Secondary | ICD-10-CM

## 2018-06-14 DIAGNOSIS — E11621 Type 2 diabetes mellitus with foot ulcer: Secondary | ICD-10-CM

## 2018-06-14 LAB — CBC
HCT: 28.1 % — ABNORMAL LOW (ref 39.0–52.0)
Hemoglobin: 8.5 g/dL — ABNORMAL LOW (ref 13.0–17.0)
MCH: 28 pg (ref 26.0–34.0)
MCHC: 30.2 g/dL (ref 30.0–36.0)
MCV: 92.4 fL (ref 80.0–100.0)
PLATELETS: 395 10*3/uL (ref 150–400)
RBC: 3.04 MIL/uL — ABNORMAL LOW (ref 4.22–5.81)
RDW: 17.5 % — ABNORMAL HIGH (ref 11.5–15.5)
WBC: 8.9 10*3/uL (ref 4.0–10.5)
nRBC: 0.2 % (ref 0.0–0.2)

## 2018-06-14 LAB — BASIC METABOLIC PANEL
Anion gap: 12 (ref 5–15)
BUN: 80 mg/dL — ABNORMAL HIGH (ref 6–20)
CALCIUM: 7.7 mg/dL — AB (ref 8.9–10.3)
CO2: 16 mmol/L — ABNORMAL LOW (ref 22–32)
CREATININE: 4.73 mg/dL — AB (ref 0.61–1.24)
Chloride: 109 mmol/L (ref 98–111)
GFR calc Af Amer: 16 mL/min — ABNORMAL LOW (ref >60)
GFR calc non Af Amer: 14 mL/min — ABNORMAL LOW (ref >60)
Glucose, Bld: 172 mg/dL — ABNORMAL HIGH (ref 70–99)
Potassium: 5.2 mmol/L — ABNORMAL HIGH (ref 3.5–5.1)
Sodium: 137 mmol/L (ref 135–145)

## 2018-06-14 LAB — GLUCOSE, CAPILLARY
Glucose-Capillary: 140 mg/dL — ABNORMAL HIGH (ref 70–99)
Glucose-Capillary: 138 mg/dL — ABNORMAL HIGH (ref 70–99)
Glucose-Capillary: 158 mg/dL — ABNORMAL HIGH (ref 70–99)

## 2018-06-14 LAB — CREATININE, URINE, RANDOM: Creatinine, Urine: 75.79 mg/dL

## 2018-06-14 LAB — SODIUM, URINE, RANDOM: Sodium, Ur: 71 mmol/L

## 2018-06-14 LAB — HEPARIN LEVEL (UNFRACTIONATED)
Heparin Unfractionated: 0.17 IU/mL — ABNORMAL LOW (ref 0.30–0.70)
Heparin Unfractionated: 0.2 IU/mL — ABNORMAL LOW (ref 0.30–0.70)

## 2018-06-14 MED ORDER — APIXABAN 5 MG PO TABS: 5.0000 mg | ORAL_TABLET | Freq: Two times a day (BID) | ORAL | 0 refills | Status: DC

## 2018-06-14 MED ORDER — ATORVASTATIN CALCIUM 10 MG PO TABS: 10.0000 mg | ORAL_TABLET | Freq: Every day | ORAL | 0 refills | Status: DC

## 2018-06-14 MED ORDER — CIPROFLOXACIN HCL 250 MG PO TABS: 250.0000 mg | ORAL_TABLET | Freq: Two times a day (BID) | ORAL | 0 refills | Status: AC

## 2018-06-14 MED ORDER — IRON 325 (65 FE) MG PO TABS: 1.0000 | ORAL_TABLET | Freq: Two times a day (BID) | ORAL | 0 refills | Status: DC

## 2018-06-14 MED ORDER — TRAMADOL HCL 50 MG PO TABS: 50.0000 mg | ORAL_TABLET | Freq: Four times a day (QID) | ORAL | 0 refills | Status: AC | PRN

## 2018-06-14 MED ORDER — ISOSORB DINITRATE-HYDRALAZINE 20-37.5 MG PO TABS: 1.0000 | ORAL_TABLET | Freq: Three times a day (TID) | ORAL | 0 refills | Status: DC

## 2018-06-14 MED ORDER — AMIODARONE HCL 200 MG PO TABS: 200.0000 mg | ORAL_TABLET | Freq: Two times a day (BID) | ORAL | 0 refills | Status: DC

## 2018-06-14 MED ORDER — TORSEMIDE 20 MG PO TABS: 60.0000 mg | ORAL_TABLET | Freq: Every day | ORAL | 0 refills | Status: DC

## 2018-06-14 MED ORDER — CARVEDILOL 25 MG PO TABS: 25.0000 mg | ORAL_TABLET | Freq: Two times a day (BID) | ORAL | 0 refills | Status: DC

## 2018-06-14 NOTE — Progress Notes (Signed)
[place] for Heparin Indication: atrial fibrillation  No Known Allergies  Patient Measurements: Height: 5\' 11"  (180.3 cm) Weight: 296 lb 8.3 oz (134.5 kg) IBW/kg (Calculated) : 75.3 Heparin Dosing Weight: 106.2  Vital Signs: Temp: 98.1 F (36.7 C) (12/26 0549) Temp Source: Oral (12/26 0549) BP: 140/96 (12/26 0549) Pulse Rate: 71 (12/26 0549)  Labs: Recent Labs    06/12/18 0312 06/12/18 1433 06/12/18 2329 06/13/18 0605 06/14/18 0217 06/14/18 1035  HGB 7.8*  --   --  8.6* 8.5*  --   HCT 25.5*  --   --  28.1* 28.1*  --   PLT 405*  --   --  398 395  --   APTT 42* 43* 50*  --   --   --   HEPARINUNFRC 0.72* 0.57 0.45 0.38 0.17* 0.20*  CREATININE 4.55*  --   --  4.66* 4.73*  --     Estimated Creatinine Clearance: 28.8 mL/min (A) (by C-G formula based on SCr of 4.73 mg/dL (H)).   Assessment: Pt is a 42 YO M on Eliquis PTA for Aflutter. Last dose taken 06/09/18 at 2349. Held for I&Ds. Heparin level today 0.2 units/ml. No issues with infusion per RN  Goal of Therapy:  Heparin level 0.3-0.7 units/mL Monitor platelets by anticoagulation protocol: Yes   Plan:  Increase heparin to 2100 units/hr Check heparin level in 8 hours Monitor daily heparin level, CBC, s/s of bleed  Thank you for allowing pharmacy to be a part of this patient's care.  Alanda Slim, PharmD, [facility] Clinical Pharmacist Please see AMION for all Pharmacists' Contact Phone Numbers 06/14/2018, 12:09 PM

## 2018-06-14 NOTE — Progress Notes (Signed)
Orthopedic Tech Progress Note Patient Details:  Joshua Massey 07-12-1975 [id number]  Ortho Devices Type of Ortho Device: Postop shoe/boot Ortho Device/Splint Location: lle Ortho Device/Splint Interventions: Ordered, Application, Adjustment   Post Interventions Patient Tolerated: Well Instructions Provided: Care of device, Adjustment of device   [name] 06/14/2018, 1:43 PM

## 2018-06-14 NOTE — Plan of Care (Signed)
  Problem: Safety: Goal: Ability to remain free from injury will improve Outcome: Progressing   

## 2018-06-14 NOTE — Progress Notes (Signed)
[place] for Heparin Indication: atrial fibrillation  No Known Allergies  Patient Measurements: Height: 5\' 11"  (180.3 cm) Weight: 296 lb 8.3 oz (134.5 kg) IBW/kg (Calculated) : 75.3 Heparin Dosing Weight: 106.2  Vital Signs: Temp: 97.7 F (36.5 C) (12/25 2125) Temp Source: Oral (12/25 2125) BP: 137/92 (12/25 2125) Pulse Rate: 68 (12/25 2125)  Labs: Recent Labs    06/12/18 0312 06/12/18 1433 06/12/18 2329 06/13/18 0605 06/14/18 0217  HGB 7.8*  --   --  8.6* 8.5*  HCT 25.5*  --   --  28.1* 28.1*  PLT 405*  --   --  398 395  APTT 42* 43* 50*  --   --   HEPARINUNFRC 0.72* 0.57 0.45 0.38 0.17*  CREATININE 4.55*  --   --  4.66*  --     Estimated Creatinine Clearance: 29.2 mL/min (A) (by C-G formula based on SCr of 4.66 mg/dL (H)).   Assessment: Pt is a 42 YO M on Eliquis PTA for Aflutter. Last dose taken 06/09/18 at 2349. Held for I&Ds. Heparin level this am 0.17 units/ml. No issues with infusion per RN  Goal of Therapy:  Heparin level 0.3-0.7 units/mL Monitor platelets by anticoagulation protocol: Yes   Plan:  Increase heparin to 1950 units/hr Check heparin level in 6 hours Monitor daily heparin level, CBC, s/s of bleed  Thank you for allowing pharmacy to be a part of this patient's care.  Excell Seltzer, PharmD

## 2018-06-14 NOTE — Discharge Instructions (Signed)
Stay of of your feet as much as possible. You can put full weight on both of your feet. Keep your current dressings clean and dry and leave them on until your outpatient follow-up in one week.

## 2018-06-14 NOTE — Progress Notes (Signed)
PROGRESS NOTE    Joshua Massey  ENI:[id number] DOB: [birth year] DOA: 06/09/2018 PCP: [name], [name], MD     Brief Narrative:  Joshua Massey is a 42 y.o. male with medical history significant of insulin-dependent diabetes but noncompliant with treatment, nonischemic cardiomyopathy with EF of 20%, chronic kidney disease stage III, Atrial flutter on Eliquis, GERD, hypertension, morbid obesity who apparently noted peeling of the skin of his left foot at the plantar aspect about 5 days ago.  Patient place a Band-Aid over it but when he took it off 2 days ago the whole skin came off.  There was drainage on the bandage.  Went to urgent care center where he was evaluated and found to have significant ulcer of the left foot.  He broke the same foot couple of years ago but that has healed.  He has significant diabetic neuropathy with poor sensation.  Patient has coronary artery disease with previous LAD lesion up to 50%.  He has peripheral vascular disease but no documented ABIs.  In the ER he has bilateral gangrenous lesions of his feet.  On the left it is the whole forefoot at the plantar surface on the right it affects the big toe.  There is also corresponding erythema and edema of both feet.  Orthopedics was consulted and patient is being admitted for treatment. Underwent irrigation and debridement of left plantar foot abscess 12/22 and repeat I&D with wound vac placement 12/24.   New events last 24 hours / Subjective: No new issues overnight. About to eat breakfast.   Assessment & Plan:   Principal Problem:   Cellulitis in diabetic foot ([place]) Active Problems:   Non-ischemic cardiomyopathy, EF < 20%   Obesity   Type 2 diabetes mellitus with vascular disease (HCC)   CKD (chronic kidney disease), stage III (HCC)   Hyperlipidemia associated with type 2 diabetes mellitus ([place])   Diabetic foot ulcer ([place])   Non-compliant patient   Bilateral diabetic foot ulcers with left plantar foot abscess    -MRI left foot shows severe arthropathy, previous fracture/dislocation, first metatarsal base erosion with draining sinus tract, ulceration plantar medial ball of foot with local necrosis of soft tissues  -S/p irrigation and debridement of left plantar foot abscess 12/22 with Dr. Ninfa Linden -S/p repeat I&D with wound vac placement 12/24 with Dr. Ninfa Linden  -Wound culture showed morganella morganii, sensitive to cephalosporin  -Antibiotics deescalated to Rocephin  -Continue monitoring, possibly remove wound vac prior to discharge home   Type 2 DM with complications including CKD, diabetic foot ulcers -Ha1c 8.5  -Lantus, SSI novolog  -Diabetes coordinator consulted -Blood sugars within acceptable range   CAD -Lipitor, bidil   CKD stage 4-5  -Baseline Cr 6 months ago ~ 3.3 -Now Cr ranging from 4.2-4.7 (may be new baseline)  -Check urine Cr and urine Na, renal US  -Trend BMP   Non-ischemic cardiomyopathy Chronic systolic and diastolic HF -EF 61-44% with diffuse hypokinesis, grade 2 diastolic dysfunction -Demadex  -Appears euvolemic on exam without exacerbation  -Previously seen Dr. Haroldine Laws in June 2019, lost to follow up due to repeated no-shows   Hx paroxysmal A Flutter -Eliquis on hold, pending possible surgical procedure, last dose 12/21 PM  -Amiodarone, coreg  -Eliquis on hold for now, resume at discharge   Morbid obesity -Body mass index is 41.36 kg/m.    DVT prophylaxis: IV heparin, transition to Eliquis once we're sure no further surgical intervention planned  Code Status: Full Family Communication: No family at  bedside Disposition Plan: Continue to monitor wound, per ortho    Consultants:   Orthopedic surgery  Procedures:   S/p irrigation and debridement of left plantar foot abscess 12/22 with Dr. Ninfa Linden  S/p repeat I&D with wound vac placement 12/24 with Dr. Ninfa Linden   Antimicrobials:  Anti-infectives (From admission, onward)   Start     Dose/Rate  Route Frequency Ordered Stop   06/12/18 1115  cefTRIAXone (ROCEPHIN) 1 g in sodium chloride 0.9 % 100 mL IVPB     1 g 200 mL/hr over 30 Minutes Intravenous Every 24 hours 06/12/18 1111     06/11/18 2000  vancomycin (VANCOCIN) 1,250 mg in sodium chloride 0.9 % 250 mL IVPB  Status:  Discontinued     1,250 mg 166.7 mL/hr over 90 Minutes Intravenous Every 36 hours 06/11/18 0849 06/12/18 1111   06/09/18 2200  piperacillin-tazobactam (ZOSYN) IVPB 3.375 g  Status:  Discontinued     3.375 g 12.5 mL/hr over 240 Minutes Intravenous Every 12 hours 06/09/18 2033 06/09/18 2137   06/09/18 2200  piperacillin-tazobactam (ZOSYN) IVPB 3.375 g  Status:  Discontinued     3.375 g 12.5 mL/hr over 240 Minutes Intravenous Every 8 hours 06/09/18 2137 06/12/18 1111   06/09/18 2100  piperacillin-tazobactam (ZOSYN) IVPB 3.375 g  Status:  Discontinued     3.375 g 12.5 mL/hr over 240 Minutes Intravenous Every 8 hours 06/09/18 2027 06/09/18 2033   06/09/18 2045  vancomycin (VANCOCIN) 2,000 mg in sodium chloride 0.9 % 500 mL IVPB     2,000 mg 250 mL/hr over 120 Minutes Intravenous  Once 06/09/18 2041 06/10/18 0114   06/09/18 2043  vancomycin variable dose per unstable renal function (pharmacist dosing)  Status:  Discontinued      Does not apply See admin instructions 06/09/18 2043 06/11/18 0849   06/09/18 2000  amoxicillin-clavulanate (AUGMENTIN) 500-125 MG per tablet 500 mg  Status:  Discontinued     1 tablet Oral 2 times daily 06/09/18 1851 06/09/18 2033   06/09/18 1900  ceFAZolin (ANCEF) IVPB 1 g/50 mL premix  Status:  Discontinued     1 g 100 mL/hr over 30 Minutes Intravenous  Once 06/09/18 1846 06/09/18 1847       Objective: Vitals:   06/13/18 0544 06/13/18 1451 06/13/18 2125 06/14/18 0549  BP: 122/78 (!) 119/94 (!) 137/92 (!) 140/96  Pulse: 75 71 68 71  Resp:  18 16 16   Temp: 97.7 F (36.5 C) 97.8 F (36.6 C) 97.7 F (36.5 C) 98.1 F (36.7 C)  TempSrc: Oral Oral Oral Oral  SpO2: 98% 97% 99% 98%    Weight:      Height:        Intake/Output Summary (Last 24 hours) at 06/14/2018 0848 Last data filed at 06/13/2018 1705 Gross per 24 hour  Intake 1080 ml  Output 1550 ml  Net -470 ml   Filed Weights   06/09/18 2035  Weight: 134.5 kg     Examination: General exam: Appears calm and comfortable  Respiratory system: Clear to auscultation. Respiratory effort normal. Cardiovascular system: S1 & S2 heard, RRR. No JVD, murmurs, rubs, gallops or clicks. No pedal edema. Gastrointestinal system: Abdomen is nondistended, soft and nontender. No organomegaly or masses felt. Normal bowel sounds heard. Central nervous system: Alert and oriented. No focal neurological deficits. Extremities: +Left foot with wound vac, right great toe with dressing with serosanguinous discharge   Psychiatry: Judgement and insight appear normal. Mood & affect appropriate.     Data Reviewed: I  have personally reviewed following labs and imaging studies  CBC: Recent Labs  Lab 06/10/18 0338 06/11/18 0240 06/12/18 0312 06/13/18 0605 06/14/18 0217  WBC 10.1 9.7 9.4 10.6* 8.9  HGB 7.8* 8.1* 7.8* 8.6* 8.5*  HCT 24.8* 26.1* 25.5* 28.1* 28.1*  MCV 89.9 92.6 91.4 93.0 92.4  PLT 423* 414* 405* 398 097   Basic Metabolic Panel: Recent Labs  Lab 06/10/18 0338 06/11/18 0240 06/12/18 0312 06/13/18 0605 06/14/18 0217  NA 137 141 136 138 137  K 4.8 5.2* 4.8 4.9 5.2*  CL 109 110 108 108 109  CO2 18* 17* 17* 17* 16*  GLUCOSE 156* 107* 125* 186* 172*  BUN 82* 77* 75* 79* 80*  CREATININE 4.28* 4.19* 4.55* 4.66* 4.73*  CALCIUM 8.0* 7.7* 7.7* 7.8* 7.7*   GFR: Estimated Creatinine Clearance: 28.8 mL/min (A) (by C-G formula based on SCr of 4.73 mg/dL (H)). Liver Function Tests: Recent Labs  Lab 06/10/18 0338  AST 16  ALT 28  ALKPHOS 211*  BILITOT 1.3*  PROT 6.8  ALBUMIN 1.7*   No results for input(s): LIPASE, AMYLASE in the last 168 hours. No results for input(s): AMMONIA in the last 168  hours. Coagulation Profile: No results for input(s): INR, PROTIME in the last 168 hours. Cardiac Enzymes: No results for input(s): CKTOTAL, CKMB, CKMBINDEX, TROPONINI in the last 168 hours. BNP (last 3 results) No results for input(s): PROBNP in the last 8760 hours. HbA1C: No results for input(s): HGBA1C in the last 72 hours. CBG: Recent Labs  Lab 06/13/18 0640 06/13/18 1104 06/13/18 1628 06/13/18 2126 06/14/18 0644  GLUCAP 179* 187* 164* 147* 140*   Lipid Profile: No results for input(s): CHOL, HDL, LDLCALC, TRIG, CHOLHDL, LDLDIRECT in the last 72 hours. Thyroid Function Tests: No results for input(s): TSH, T4TOTAL, FREET4, T3FREE, THYROIDAB in the last 72 hours. Anemia Panel: No results for input(s): VITAMINB12, FOLATE, FERRITIN, TIBC, IRON, RETICCTPCT in the last 72 hours. Sepsis Labs: No results for input(s): PROCALCITON, LATICACIDVEN in the last 168 hours.  Recent Results (from the past 240 hour(s))  Surgical PCR screen     Status: Abnormal   Collection Time: 06/10/18  9:55 AM  Result Value Ref Range Status   MRSA, PCR NEGATIVE NEGATIVE Final   Staphylococcus aureus POSITIVE (A) NEGATIVE Final    Comment: (NOTE) The Xpert SA Assay (FDA approved for NASAL specimens in patients 80 years of age and older), is one component of a comprehensive surveillance program. It is not intended to diagnose infection nor to guide or monitor treatment.   Aerobic/Anaerobic Culture (surgical/deep wound)     Status: None (Preliminary result)   Collection Time: 06/10/18  2:15 PM  Result Value Ref Range Status   Specimen Description ABSCESS LEFT,FOOT  Final   Special Requests NONE  Final   Gram Stain   Final    RARE WBC PRESENT, PREDOMINANTLY PMN FEW GRAM POSITIVE COCCI IN PAIRS RARE GRAM NEGATIVE RODS    Culture   Final    FEW MORGANELLA MORGANII MODERATE BACTEROIDES THETAIOTAOMICRON BETA LACTAMASE POSITIVE Performed at [facility], [place] [address] [place], [place]  [address]    Report Status PENDING  Incomplete   Organism ID, Bacteria MORGANELLA MORGANII  Final      Susceptibility   Morganella morganii - MIC*    AMPICILLIN >=32 RESISTANT Resistant     CEFAZOLIN >=64 RESISTANT Resistant     CEFEPIME <=1 SENSITIVE Sensitive     CEFTAZIDIME <=1 SENSITIVE Sensitive  CEFTRIAXONE <=1 SENSITIVE Sensitive     CIPROFLOXACIN <=0.25 SENSITIVE Sensitive     GENTAMICIN <=1 SENSITIVE Sensitive     IMIPENEM 1 SENSITIVE Sensitive     TRIMETH/SULFA >=320 RESISTANT Resistant     AMPICILLIN/SULBACTAM >=32 RESISTANT Resistant     PIP/TAZO <=4 SENSITIVE Sensitive     * FEW MORGANELLA MORGANII       Radiology Studies: No results found.    Scheduled Meds: . amiodarone  200 mg Oral BID  . atorvastatin  10 mg Oral Q supper  . carvedilol  25 mg Oral BID WC  . ferrous sulfate  325 mg Oral BID WC  . fluticasone  1 spray Each Nare Daily  . insulin aspart  0-20 Units Subcutaneous TID WC  . insulin aspart  0-5 Units Subcutaneous QHS  . insulin glargine  5 Units Subcutaneous QHS  . isosorbide-hydrALAZINE  1 tablet Oral TID  . mupirocin ointment  1 application Nasal BID  . pantoprazole  40 mg Oral QAC breakfast  . torsemide  60 mg Oral Daily   Continuous Infusions: . sodium chloride 10 mL/hr at 06/12/18 0820  . cefTRIAXone (ROCEPHIN)  IV 1 g (06/13/18 1159)  . heparin 1,950 Units/hr (06/14/18 0319)     LOS: 5 days    Time spent: 20 minutes   Dessa Phi, DO [facility] [email or web address] Password [facility] 06/14/2018, 8:48 AM

## 2018-06-14 NOTE — Progress Notes (Signed)
Pt was ambulating independently in the room, post op shoe on to left foot. Wound vac was removed and dressing changed by Dr Ninfa Linden. Discharge instructions was given to pt, verbalized understanding. Discharged to home accompanied by family.

## 2018-06-14 NOTE — Evaluation (Signed)
Physical Therapy Evaluation Patient Details Name: Joshua Massey MRN: [id number] DOB: May 17, 1976 Today's Date: 06/14/2018   History of Present Illness  Pt with medical history significant of insulin-dependent diabetes but noncompliant with treatment and significant diabetic neuropathy, nonischemic cardiomyopathy with EF of 20%, chronic kidney disease stage III, GERD, hypertension, morbid obesity who presents with significant ulcer of the left foot.  Work up revealed suspected cellulitis with possible revision dry gangrene.  Orthopedics was consulted and patient underwent I&D left foot x 2 (12/22 and 12/24).  Now with VAC on left foot.  Clinical Impression  Patient presents with mild dependencies in gait and mobility, mostly related to maintaining weight bearing through heel only.  No balance deficits noted.  Feel patient safe to d/c home with RW at this time.  If he remains in the hospital, will see in 1-2 days to progress to cane if ready.      Follow Up Recommendations No PT follow up    Equipment Recommendations  None recommended by PT    Recommendations for Other Services       Precautions / Restrictions Restrictions LLE Weight Bearing: Partial weight bearing      Mobility  Bed Mobility Overal bed mobility: Independent                Transfers Overall transfer level: Modified independent Equipment used: Rolling walker (2 wheeled)                Ambulation/Gait Ambulation/Gait assistance: Supervision Gait Distance (Feet): 100 Feet Assistive device: Rolling walker (2 wheeled) Gait Pattern/deviations: Step-through pattern     General Gait Details: patient required multiple cues to weight bear through heel only, no loss of balance noted, steady during ambulation.  Stairs            Wheelchair Mobility    Modified Rankin (Stroke Patients Only)       Balance Overall balance assessment: No apparent balance deficits (not formally assessed)                                            Pertinent Vitals/Pain Pain Assessment: No/denies pain    Home Living Family/patient expects to be discharged to:: Private residence Living Arrangements: Spouse/significant other;Children Available Help at Discharge: Family Type of Home: Apartment Home Access: Level entry     Home Layout: One level Home Equipment: Cane - single point;Walker - 2 wheels      Prior Function Level of Independence: Independent               Hand Dominance        Extremity/Trunk Assessment   Upper Extremity Assessment Upper Extremity Assessment: Overall WFL for tasks assessed    Lower Extremity Assessment Lower Extremity Assessment: Overall WFL for tasks assessed       Communication   Communication: No difficulties  Cognition Arousal/Alertness: Awake/alert Behavior During Therapy: WFL for tasks assessed/performed Overall Cognitive Status: Within Functional Limits for tasks assessed                                        General Comments      Exercises     Assessment/Plan    PT Assessment Patient needs continued PT services  PT Problem List Decreased activity tolerance;Decreased knowledge of precautions  PT Treatment Interventions DME instruction;Gait training    PT Goals (Current goals can be found in the Care Plan section)  Acute Rehab PT Goals Patient Stated Goal: "go home today or tomorrow" PT Goal Formulation: With patient Time For Goal Achievement: 06/17/18 Potential to Achieve Goals: Good    Frequency Min 2X/week   Barriers to discharge        Co-evaluation               AM-PAC PT "6 Clicks" Mobility  Outcome Measure Help needed turning from your back to your side while in a flat bed without using bedrails?: None Help needed moving from lying on your back to sitting on the side of a flat bed without using bedrails?: None Help needed moving to and from a bed to a chair  (including a wheelchair)?: None Help needed standing up from a chair using your arms (e.g., wheelchair or bedside chair)?: None Help needed to walk in hospital room?: None Help needed climbing 3-5 steps with a railing? : A Little 6 Click Score: 23    End of Session Equipment Utilized During Treatment: Gait belt Activity Tolerance: Patient tolerated treatment well Patient left: in bed;with call bell/phone within reach   PT Visit Diagnosis: Other abnormalities of gait and mobility (R26.89)    Time: 1020-1040 PT Time Calculation (min) (ACUTE ONLY): 20 min   Charges:   PT Evaluation $PT Eval Moderate Complexity: 1 Mod          06/14/2018 [place], PT Acute Rehabilitation Services Pager:  [phone] Office:  [place], [place] 06/14/2018, 10:46 AM

## 2018-06-14 NOTE — Discharge Summary (Addendum)
Physician Discharge Summary  Joshua Massey:[id number] DOB: 29-Dec-1975 DOA: 06/09/2018  PCP: [name], [name], MD  Admit date: 06/09/2018 Discharge date: 06/14/2018  Admitted From: Home Disposition:  Home  Recommendations for Outpatient Follow-up:  1. Follow up with PCP in 1 week 2. Follow up with Dr. Sharol Given in 1 week post-op  3. Follow up with Dr. Haroldine Laws  4. Recommend establishing with Nephrology for CKD  5. Please obtain BMP in 1 week   Discharge Condition: Stable CODE STATUS: Full  Diet recommendation: Carb modified, heart healthy   Brief/Interim Summary: Joshua Arizola Simmonsis a 42 y.o.malewith medical history significant ofinsulin-dependent diabetes but noncompliant with treatment, nonischemic cardiomyopathy with EF of 20%, chronic kidney disease stage III,Atrial flutter on Eliquis, GERD, hypertension, morbid obesity who apparently noted peeling of the skin of his left foot at the plantar aspect about 5 days ago. Patient place a Band-Aid over it but when he took it off 2 days ago the whole skin came off. There was drainage on the bandage. Went to urgent care center where he was evaluated and found to have significant ulcer of the left foot. He broke the same foot couple of years ago but that has healed. He has significant diabetic neuropathy with poor sensation. Patient has coronary artery disease with previous LAD lesion up to 50%. He has peripheral vascular disease but no documented ABIs. In the ER he has bilateral gangrenous lesions of his feet. On the left it is the whole forefoot at the plantar surface on the right it affects the big toe. There is also corresponding erythema and edema of both feet. Orthopedics was consulted and patient is being admitted for treatment. Underwent irrigation and debridement of left plantar foot abscess 12/22 and repeat I&D with wound vac placement 12/24. Wound vac was removed prior to discharge.   Discharge Diagnoses:  Principal  Problem:   Diabetic foot ulcer ([place]) Active Problems:   Non-ischemic cardiomyopathy, EF < 20%   Obesity   Type 2 diabetes mellitus with vascular disease (HCC)   CKD (chronic kidney disease), stage III (HCC)   Hyperlipidemia associated with type 2 diabetes mellitus ([place])   Cellulitis in diabetic foot ([place])   Non-compliant patient  Bilateral diabetic foot ulcers with left plantar foot abscess  -MRI left foot shows severe arthropathy, previous fracture/dislocation, first metatarsal base erosion with draining sinus tract, ulceration plantar medial ball of foot with local necrosis of soft tissues  -S/p irrigation and debridement of left plantar foot abscess 12/22 with Dr. Ninfa Linden -S/p repeat I&D with wound vac placement 12/24 with Dr. Ninfa Linden  -Wound culture showed morganella morganii, sensitive to cipro. Renally dosed, discussed with pharmacy  -Follow up with Dr. Sharol Given in 1 week   Type 2 DM with complications including CKD, diabetic foot ulcers -Ha1c 8.5  -Lantus, SSI novolog  -Diabetes coordinator consulted -Blood sugars within acceptable range   CAD -Lipitor, bidil   CKD stage 4-5  -Baseline Cr 6 months ago ~ 3.3 -Now Cr ranging from 4.2-4.7 (may be new baseline)  -Renal US showed chronic medical renal disease  -Would recommend establishing with Nephrology as outpatient   Non-ischemic cardiomyopathy Chronic systolic and diastolic HF -EF 81-19% with diffuse hypokinesis, grade 2 diastolic dysfunction  -Demadex  -Appears euvolemic on exam without exacerbation  -Previously seen Dr. Haroldine Laws in June 2019, lost to follow up due to repeated no-shows   Hx paroxysmal A Flutter -Amiodarone, coreg  -Eliquis   Morbid obesity -Body mass index is 41.36 kg/m  Discharge Instructions  Discharge Instructions    (HEART FAILURE PATIENTS) Call MD:  Anytime you have any of the following symptoms: 1) 3 pound weight gain in 24 hours or 5 pounds in 1 week 2) shortness of breath,  with or without a dry hacking cough 3) swelling in the hands, feet or stomach 4) if you have to sleep on extra pillows at night in order to breathe.   Complete by:  As directed    Call MD for:  difficulty breathing, headache or visual disturbances   Complete by:  As directed    Call MD for:  extreme fatigue   Complete by:  As directed    Call MD for:  persistant dizziness or light-headedness   Complete by:  As directed    Call MD for:  persistant nausea and vomiting   Complete by:  As directed    Call MD for:  redness, tenderness, or signs of infection (pain, swelling, redness, odor or green/yellow discharge around incision site)   Complete by:  As directed    Call MD for:  severe uncontrolled pain   Complete by:  As directed    Call MD for:  temperature >100.4   Complete by:  As directed    Diet - low sodium heart healthy   Complete by:  As directed    Diet Carb Modified   Complete by:  As directed    Discharge instructions   Complete by:  As directed    You were cared for by a hospitalist during your hospital stay. If you have any questions about your discharge medications or the care you received while you were in the hospital after you are discharged, you can call the unit and ask to speak with the hospitalist on call if the hospitalist that took care of you is not available. Once you are discharged, your primary care physician will handle any further medical issues. Please note that NO REFILLS for any discharge medications will be authorized once you are discharged, as it is imperative that you return to your primary care physician (or establish a relationship with a primary care physician if you do not have one) for your aftercare needs so that they can reassess your need for medications and monitor your lab values.   Increase activity slowly   Complete by:  As directed      Allergies as of 06/14/2018   No Known Allergies     Medication List    STOP taking these medications    pantoprazole 40 MG tablet Commonly known as:  PROTONIX   potassium chloride SA 20 MEQ tablet Commonly known as:  KLOR-CON M20     TAKE these medications   acetaminophen 500 MG tablet Commonly known as:  TYLENOL Take 1,000 mg by mouth every 6 (six) hours as needed for moderate pain or headache.   amiodarone 200 MG tablet Commonly known as:  PACERONE Take 1 tablet (200 mg total) by mouth 2 (two) times daily. What changed:    how much to take  how to take this  when to take this  additional instructions   apixaban 5 MG Tabs tablet Commonly known as:  ELIQUIS Take 1 tablet (5 mg total) by mouth 2 (two) times daily.   atorvastatin 10 MG tablet Commonly known as:  LIPITOR Take 1 tablet (10 mg total) by mouth daily with supper.   carvedilol 25 MG tablet Commonly known as:  COREG Take 1 tablet (25 mg total) by  mouth 2 (two) times daily with a meal.   ciprofloxacin 250 MG tablet Commonly known as:  CIPRO Take 1 tablet (250 mg total) by mouth 2 (two) times daily for 7 days.   freestyle lancets Use as instructed   FREESTYLE LIBRE 14 DAY READER Devi 1 Device by Does not apply route daily.   FREESTYLE LIBRE 14 DAY SENSOR Misc 1 Device by Does not apply route daily.   glucose blood test strip Commonly known as:  FREESTYLE TEST STRIPS Use as instructed   glucose monitoring kit monitoring kit 1 each by Does not apply route 4 (four) times daily - after meals and at bedtime. 1 month Diabetic Testing Supplies for QAC-QHS accuchecks.   insulin aspart 100 UNIT/ML FlexPen Commonly known as:  NOVOLOG FLEXPEN 0-15U/Subcu/TIDw/meals//CBG70-120:0U/CBG121-150:2U/CBG151-200:3U/CBG201-250:5U/CBG251-300:8U/CBG301-350:11U/CBG351-400:15U//CBG>400:Call MD   insulin glargine 100 UNIT/ML injection Commonly known as:  LANTUS Inject 0.05 mLs (5 Units total) into the skin at bedtime.   Insulin Pen Needle 32G X 8 MM Misc Use as directed   Iron 325 (65 Fe) MG Tabs Take 1 tablet (325 mg  total) by mouth 2 (two) times daily with a meal.   isosorbide-hydrALAZINE 20-37.5 MG tablet Commonly known as:  BIDIL Take 1 tablet by mouth 3 (three) times daily.   torsemide 20 MG tablet Commonly known as:  DEMADEX Take 3 tablets (60 mg total) by mouth daily.   traMADol 50 MG tablet Commonly known as:  ULTRAM Take 1 tablet (50 mg total) by mouth every 6 (six) hours as needed for up to 5 days.   Vitamin D (Ergocalciferol) 1.25 MG (50000 UT) Caps capsule Commonly known as:  DRISDOL Take 1 capsule by mouth once weekly for 8 weeks, and then every 2 weeks. What changed:    how much to take  how to take this  when to take this  additional instructions      Follow-up Information    Newt Minion, MD. Schedule an appointment as soon as possible for a visit in 1 week(s).   Specialty:  Orthopedic Surgery Contact information: [place] [address] [phone]        [name], [name], MD. Schedule an appointment as soon as possible for a visit in 1 week(s).   Specialty:  Family Medicine Contact information: [place] [address] [phone]        Bensimhon, Shaune Pascal, MD. Call.   Specialty:  Cardiology Contact information: [address] [place] [address] [phone]          No Known Allergies  Consultations:  Orthopedic surgery    Procedures/Studies: US Renal  Result Date: 06/14/2018 CLINICAL DATA:  Acute kidney injury.  Chronic kidney disease. EXAM: RENAL / URINARY TRACT ULTRASOUND COMPLETE COMPARISON:  08/17/2016 FINDINGS: Right Kidney: Renal measurements: 11.9 by 6.2 by 6.1 cm = volume: 236 mL. Abnormal increased echogenicity. No mass or hydronephrosis visualized. Left Kidney: Renal measurements: 11.1 by 6.6 by 5.5 cm = volume: 211 mL. Abnormal increased echogenicity. No mass or hydronephrosis visualized. Bladder: Appears normal for degree of bladder distention. Other: Trace  ascites along the hepatic margin. IMPRESSION: 1. Bilateral echogenic kidneys suggesting chronic medical renal disease. 2. No hydronephrosis or hydroureter. 3. Trace ascites along the hepatic margin Electronically Signed   By: Van Clines M.D.   On: 06/14/2018 09:18   Mr Foot Left Wo Contrast  Result Date: 06/10/2018 CLINICAL DATA:  Open wound medially on the left midfoot, nonhealing ulcer. Diabetes. EXAM:  MRI OF THE LEFT FOOT WITHOUT CONTRAST TECHNIQUE: Multiplanar, multisequence MR imaging of the left forefoot was performed. No intravenous contrast was administered. COMPARISON:  06/09/2018 FINDINGS: Bones/Joint/Cartilage Markedly severe Lisfranc arthropathy with primarily homolateral dislocation (although primarily dorsal dislocation of the first metatarsal with respect to the medial cuneiform) common erosions, and some early fragmentation as well as marked irregularity of the articular surfaces and subcortical marrow edema most pronounced medially. The second through fifth metatarsals are considerably further laterally displaced than the first. There may certainly be a component of Charcot arthropathy. The severe arthropathy at the first MTP joint is associated with an effusion at the articulation or pseudoarticulation, which appears to track caudad through the soft tissues all the way to the skin surface on image 36/7 and image 16/9. This would favor a draining infection. Ligaments The Lisfranc ligament is not intact. Muscles and Tendons Generalized edema in the plantar musculature of the foot. Soft tissues Soft tissue ulceration of the medial ball of the foot with a small amount of gas tracking in the regional subcutaneous soft tissues as shown on image 25/6. Overlying bandaging noted. As noted above, there is an effusion at the articulation or pseudoarticulation between the first metatarsal and the medial cuneiform, which appears to have a draining sinus tract extending to the plantar medial foot on  image 36/7. This is proximal to the dominant region of ulceration. This would imply a draining infection. IMPRESSION: 1. 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 The second through fifth metatarsals are dislocated laterally in the first metatarsal is dislocated dorsally with respect to the midfoot, with fragmentation and articular irregularity along the Lisfranc joint which has a significant chronic component. 2. There is a large erosion of the base of the first metatarsal. An effusion of the articulation/pseudoarticulation of the first metatarsal with the medial cuneiform is present and appears to drain through a sinus tract down to the medial plantar soft tissues of the foot for example on image 36/7. This would tend to imply infection of the articulation and a draining sinus tract. 3. Ulceration along the plantar medial ball of the foot with extensive subcutaneous phlegmon and a small amount of gas in the soft tissues, compatible with cellulitis and local necrosis of the soft tissues. Electronically Signed   By: Van Clines M.D.   On: 06/10/2018 09:45   Dg Foot 2 Views Right  Result Date: 06/10/2018 CLINICAL DATA:  Diabetic ulcer right foot plantar aspect great toe. EXAM: RIGHT FOOT - 2 VIEW COMPARISON:  11/27/2017 FINDINGS: Hardware over the distal fibula intact. Mild degenerate change over the midfoot/hindfoot. Degenerative change with pencil in cup deformity of the second PIP joint. No significant air in the soft tissues over the plantar aspect of the great toe. No bone destruction to suggest osteomyelitis. IMPRESSION: No acute findings. Degenerative changes as described. Pencil in cup deformity of the second PIP joint seen with psoriatic arthritis although may be due to rheumatoid arthritis or reactive arthritis. Electronically Signed   By: Marin Olp M.D.   On: 06/10/2018 11:13   Dg Foot  Complete Left  Result Date: 06/09/2018 CLINICAL DATA:  Swelling and redness EXAM: LEFT FOOT - COMPLETE 3+ VIEW COMPARISON:  08/22/2013 FINDINGS: Old fracture deformity of the proximal shaft of the fifth metatarsal. Bony destructive changes at the TMT joints with Lisfranc deformity; there is lateral displacement of the second through fifth metatarsal bases with respect to  the tarsal bones. Bony resorptive changes involving the base of the first metatarsal. Pes planus deformity. Ununited fracture midportion of the second middle phalanx. Mid and distal plantar surface ulcers without soft tissue emphysema or radiopaque foreign body. IMPRESSION: 1. Deformity and bony destructive changes at the level of the TMT joints with Lisfranc deformity present. Findings could be secondary to neuropathic joint assessment for osteomyelitis limited given the gross deformity and bony destructive changes. 2. Ununited fracture with bone resorptive changes at the second middle phalanx. 3. Diffuse soft tissue swelling. Irregularity and lucency at the mid to distal plantar surface of the foot consistent with ulcers. Electronically Signed   By: Donavan Foil M.D.   On: 06/09/2018 18:22    Discharge Exam: Vitals:   06/14/18 0549 06/14/18 1413  BP: (!) 140/96 124/87  Pulse: 71 74  Resp: 16 16  Temp: 98.1 F (36.7 C) 97.7 F (36.5 C)  SpO2: 98% 100%    General exam: Appears calm and comfortable  Respiratory system: Clear to auscultation. Respiratory effort normal. Cardiovascular system: S1 & S2 heard, RRR. No JVD, murmurs, rubs, gallops or clicks. No pedal edema. Gastrointestinal system: Abdomen is nondistended, soft and nontender. No organomegaly or masses felt. Normal bowel sounds heard. Central nervous system: Alert and oriented. No focal neurological deficits. Extremities: +Left foot with wound vac, right great toe with dressing with serosanguinous discharge   Psychiatry: Judgement and insight appear normal. Mood &  affect appropriate.       The results of significant diagnostics from this hospitalization (including imaging, microbiology, ancillary and laboratory) are listed below for reference.     Microbiology: Recent Results (from the past 240 hour(s))  Surgical PCR screen     Status: Abnormal   Collection Time: 06/10/18  9:55 AM  Result Value Ref Range Status   MRSA, PCR NEGATIVE NEGATIVE Final   Staphylococcus aureus POSITIVE (A) NEGATIVE Final    Comment: (NOTE) The Xpert SA Assay (FDA approved for NASAL specimens in patients 34 years of age and older), is one component of a comprehensive surveillance program. It is not intended to diagnose infection nor to guide or monitor treatment.   Aerobic/Anaerobic Culture (surgical/deep wound)     Status: None (Preliminary result)   Collection Time: 06/10/18  2:15 PM  Result Value Ref Range Status   Specimen Description ABSCESS LEFT,FOOT  Final   Special Requests NONE  Final   Gram Stain   Final    RARE WBC PRESENT, PREDOMINANTLY PMN FEW GRAM POSITIVE COCCI IN PAIRS RARE GRAM NEGATIVE RODS    Culture   Final    FEW MORGANELLA MORGANII MODERATE BACTEROIDES THETAIOTAOMICRON BETA LACTAMASE POSITIVE CULTURE REINCUBATED FOR BETTER GROWTH Performed at [facility], [place] [address] [place], [place] [address]    Report Status PENDING  Incomplete   Organism ID, Bacteria MORGANELLA MORGANII  Final      Susceptibility   Morganella morganii - MIC*    AMPICILLIN >=32 RESISTANT Resistant     CEFAZOLIN >=64 RESISTANT Resistant     CEFEPIME <=1 SENSITIVE Sensitive     CEFTAZIDIME <=1 SENSITIVE Sensitive     CEFTRIAXONE <=1 SENSITIVE Sensitive     CIPROFLOXACIN <=0.25 SENSITIVE Sensitive     GENTAMICIN <=1 SENSITIVE Sensitive     IMIPENEM 1 SENSITIVE Sensitive     TRIMETH/SULFA >=320 RESISTANT Resistant     AMPICILLIN/SULBACTAM >=32 RESISTANT Resistant     PIP/TAZO <=4 SENSITIVE Sensitive     * FEW MORGANELLA MORGANII  Labs: BNP  (last 3 results) Recent Labs    11/27/17 1422  BNP 381.0*   Basic Metabolic Panel: Recent Labs  Lab 06/10/18 0338 06/11/18 0240 06/12/18 0312 06/13/18 0605 06/14/18 0217  NA 137 141 136 138 137  K 4.8 5.2* 4.8 4.9 5.2*  CL 109 110 108 108 109  CO2 18* 17* 17* 17* 16*  GLUCOSE 156* 107* 125* 186* 172*  BUN 82* 77* 75* 79* 80*  CREATININE 4.28* 4.19* 4.55* 4.66* 4.73*  CALCIUM 8.0* 7.7* 7.7* 7.8* 7.7*   Liver Function Tests: Recent Labs  Lab 06/10/18 0338  AST 16  ALT 28  ALKPHOS 211*  BILITOT 1.3*  PROT 6.8  ALBUMIN 1.7*   No results for input(s): LIPASE, AMYLASE in the last 168 hours. No results for input(s): AMMONIA in the last 168 hours. CBC: Recent Labs  Lab 06/10/18 0338 06/11/18 0240 06/12/18 0312 06/13/18 0605 06/14/18 0217  WBC 10.1 9.7 9.4 10.6* 8.9  HGB 7.8* 8.1* 7.8* 8.6* 8.5*  HCT 24.8* 26.1* 25.5* 28.1* 28.1*  MCV 89.9 92.6 91.4 93.0 92.4  PLT 423* 414* 405* 398 395   Cardiac Enzymes: No results for input(s): CKTOTAL, CKMB, CKMBINDEX, TROPONINI in the last 168 hours. BNP: Invalid input(s): POCBNP CBG: Recent Labs  Lab 06/13/18 1104 06/13/18 1628 06/13/18 2126 06/14/18 0644 06/14/18 1154  GLUCAP 187* 164* 147* 140* 138*   D-Dimer No results for input(s): DDIMER in the last 72 hours. Hgb A1c No results for input(s): HGBA1C in the last 72 hours. Lipid Profile No results for input(s): CHOL, HDL, LDLCALC, TRIG, CHOLHDL, LDLDIRECT in the last 72 hours. Thyroid function studies No results for input(s): TSH, T4TOTAL, T3FREE, THYROIDAB in the last 72 hours.  Invalid input(s): FREET3 Anemia work up No results for input(s): VITAMINB12, FOLATE, FERRITIN, TIBC, IRON, RETICCTPCT in the last 72 hours. Urinalysis    Component Value Date/Time   COLORURINE AMBER (A) 08/17/2016 0129   APPEARANCEUR HAZY (A) 08/17/2016 0129   LABSPEC 1.016 08/17/2016 0129   PHURINE 5.0 08/17/2016 0129   GLUCOSEU 50 (A) 08/17/2016 0129   HGBUR SMALL (A)  08/17/2016 0129   BILIRUBINUR NEGATIVE 08/17/2016 0129   KETONESUR NEGATIVE 08/17/2016 0129   PROTEINUR >=300 (A) 08/17/2016 0129   UROBILINOGEN 0.2 09/02/2009 0935   NITRITE NEGATIVE 08/17/2016 0129   LEUKOCYTESUR NEGATIVE 08/17/2016 0129   Sepsis Labs Invalid input(s): PROCALCITONIN,  WBC,  LACTICIDVEN Microbiology Recent Results (from the past 240 hour(s))  Surgical PCR screen     Status: Abnormal   Collection Time: 06/10/18  9:55 AM  Result Value Ref Range Status   MRSA, PCR NEGATIVE NEGATIVE Final   Staphylococcus aureus POSITIVE (A) NEGATIVE Final    Comment: (NOTE) The Xpert SA Assay (FDA approved for NASAL specimens in patients 86 years of age and older), is one component of a comprehensive surveillance program. It is not intended to diagnose infection nor to guide or monitor treatment.   Aerobic/Anaerobic Culture (surgical/deep wound)     Status: None (Preliminary result)   Collection Time: 06/10/18  2:15 PM  Result Value Ref Range Status   Specimen Description ABSCESS LEFT,FOOT  Final   Special Requests NONE  Final   Gram Stain   Final    RARE WBC PRESENT, PREDOMINANTLY PMN FEW GRAM POSITIVE COCCI IN PAIRS RARE GRAM NEGATIVE RODS    Culture   Final    FEW MORGANELLA MORGANII MODERATE BACTEROIDES THETAIOTAOMICRON BETA LACTAMASE POSITIVE CULTURE REINCUBATED FOR BETTER GROWTH Performed at [facility],  [address] [place], [place] [address]    Report Status PENDING  Incomplete   Organism ID, Bacteria MORGANELLA MORGANII  Final      Susceptibility   Morganella morganii - MIC*    AMPICILLIN >=32 RESISTANT Resistant     CEFAZOLIN >=64 RESISTANT Resistant     CEFEPIME <=1 SENSITIVE Sensitive     CEFTAZIDIME <=1 SENSITIVE Sensitive     CEFTRIAXONE <=1 SENSITIVE Sensitive     CIPROFLOXACIN <=0.25 SENSITIVE Sensitive     GENTAMICIN <=1 SENSITIVE Sensitive     IMIPENEM 1 SENSITIVE Sensitive     TRIMETH/SULFA >=320 RESISTANT Resistant      AMPICILLIN/SULBACTAM >=32 RESISTANT Resistant     PIP/TAZO <=4 SENSITIVE Sensitive     * FEW MORGANELLA MORGANII     Patient was seen and examined on the day of discharge and was found to be in stable condition. Time coordinating discharge: 35 minutes including assessment and coordination of care, as well as examination of the patient.   SIGNED:  Dessa Phi, DO [facility] Pager [phone]  If 7PM-7AM, please contact night-coverage [email or web address] Password [facility] 06/14/2018, 3:41 PM

## 2018-06-14 NOTE — Progress Notes (Signed)
Patient ID: Joshua Massey, male   DOB: May 31, 1976, 42 y.o.   MRN: [id number] He looks better overall.  Dressings on both feet are changed and I removed the VAC on his left foot.  The wounds are clean and dry.  I will order post-op shoes for both his feet.  He can be discharged from Ortho's standpoint.  Follow-up should be with Dr. Sharol Given in one week.

## 2018-06-15 LAB — AEROBIC/ANAEROBIC CULTURE W GRAM STAIN (SURGICAL/DEEP WOUND)

## 2018-06-15 LAB — AEROBIC/ANAEROBIC CULTURE (SURGICAL/DEEP WOUND)

## 2018-06-21 ENCOUNTER — Encounter (INDEPENDENT_AMBULATORY_CARE_PROVIDER_SITE_OTHER): Payer: Self-pay | Admitting: Orthopaedic Surgery

## 2018-06-21 ENCOUNTER — Ambulatory Visit (INDEPENDENT_AMBULATORY_CARE_PROVIDER_SITE_OTHER): Payer: BLUE CROSS/BLUE SHIELD | Admitting: Orthopaedic Surgery

## 2018-06-21 DIAGNOSIS — L03119 Cellulitis of unspecified part of limb: Secondary | ICD-10-CM

## 2018-06-21 DIAGNOSIS — L089 Local infection of the skin and subcutaneous tissue, unspecified: Secondary | ICD-10-CM

## 2018-06-21 HISTORY — DX: Local infection of the skin and subcutaneous tissue, unspecified: L08.9

## 2018-06-21 NOTE — Progress Notes (Signed)
The patient is return for follow-up after an extensive irrigation debridement of plantar foot abscess of his left foot.  He also has a wound on his right great toe.  He was in the hospital for several days and we performed at least 2 surgeries to washout his foot.  This was done under blocks with regional anesthesia due to his poor ejection fraction.  He is also not had the best diabetic control.  He states he is working on this.  On examination of his left foot plantar aspect, the sutures are intact and there is no gross purulence and no significant odor but certainly the wound looks like there is some breakdown.  The wound on his right great toe is full-thickness but no exposed bone and no gross purulence.  He is going to place Bactroban ointment on the great toe wound daily on the right side.  He is to keep his left foot is dry as possible.  We need to get him a follow-up with my partner Dr. Sharol Given who specializes in care wounds on diabetic feet and legs.  He understands importance of good nutrition diet and blood glucose control.  We will have Dr. Sharol Given take a look at him from a chronic wound standpoint and hopefully assess his potential for healing.  We will continue postop shoe on his left foot.

## 2018-06-26 ENCOUNTER — Telehealth (INDEPENDENT_AMBULATORY_CARE_PROVIDER_SITE_OTHER): Payer: Self-pay

## 2018-06-26 ENCOUNTER — Telehealth (INDEPENDENT_AMBULATORY_CARE_PROVIDER_SITE_OTHER): Payer: Self-pay | Admitting: Orthopedic Surgery

## 2018-06-26 NOTE — Telephone Encounter (Signed)
Patient left a message stating that he has finished his antibiotics and wanted to know if he needs to continue taking it.  CB#[phone].  Thank you.

## 2018-06-26 NOTE — Telephone Encounter (Signed)
I tried to call patient and inform him that on his scheduled appt on the 9th, Dr Sharol Given will clarify for him if he needs to continue to use the Bactroban that was prescribed by Dr Ninfa Linden but most likely he wants him to continue the ointment until finished. Patient did not answer.

## 2018-06-27 ENCOUNTER — Ambulatory Visit (INDEPENDENT_AMBULATORY_CARE_PROVIDER_SITE_OTHER): Payer: BLUE CROSS/BLUE SHIELD | Admitting: Orthopedic Surgery

## 2018-06-27 NOTE — Telephone Encounter (Signed)
Patient left a message stating that he has finished his antibiotics and wanted to know if he needs to continue taking it.  CB#[phone].  Thank you.

## 2018-06-27 NOTE — Telephone Encounter (Signed)
Please advise, I think he is Dr Trevor Mace patient and he is only taking Bactroban ointment and I do not know how his wound is.

## 2018-06-27 NOTE — Telephone Encounter (Signed)
He can stop his antibiotics at this standpoint.

## 2018-06-27 NOTE — Telephone Encounter (Signed)
Patient aware of the below message  

## 2018-06-28 ENCOUNTER — Ambulatory Visit (INDEPENDENT_AMBULATORY_CARE_PROVIDER_SITE_OTHER): Payer: BLUE CROSS/BLUE SHIELD | Admitting: Physician Assistant

## 2018-06-28 ENCOUNTER — Encounter (INDEPENDENT_AMBULATORY_CARE_PROVIDER_SITE_OTHER): Payer: Self-pay | Admitting: Orthopedic Surgery

## 2018-06-28 VITALS — Ht 71.0 in | Wt 296.0 lb

## 2018-06-28 DIAGNOSIS — Z794 Long term (current) use of insulin: Secondary | ICD-10-CM

## 2018-06-28 DIAGNOSIS — E11621 Type 2 diabetes mellitus with foot ulcer: Secondary | ICD-10-CM

## 2018-06-28 DIAGNOSIS — E43 Unspecified severe protein-calorie malnutrition: Secondary | ICD-10-CM

## 2018-06-28 DIAGNOSIS — L97516 Non-pressure chronic ulcer of other part of right foot with bone involvement without evidence of necrosis: Secondary | ICD-10-CM

## 2018-06-28 DIAGNOSIS — L97426 Non-pressure chronic ulcer of left heel and midfoot with bone involvement without evidence of necrosis: Secondary | ICD-10-CM

## 2018-06-28 DIAGNOSIS — N183 Chronic kidney disease, stage 3 unspecified: Secondary | ICD-10-CM

## 2018-06-28 DIAGNOSIS — E1142 Type 2 diabetes mellitus with diabetic polyneuropathy: Secondary | ICD-10-CM

## 2018-06-28 DIAGNOSIS — Z6841 Body Mass Index (BMI) 40.0 and over, adult: Secondary | ICD-10-CM

## 2018-06-28 DIAGNOSIS — I428 Other cardiomyopathies: Secondary | ICD-10-CM

## 2018-06-28 MED ORDER — TRAMADOL HCL 50 MG PO TABS: 50.0000 mg | ORAL_TABLET | Freq: Four times a day (QID) | ORAL | 0 refills | Status: DC | PRN

## 2018-06-30 ENCOUNTER — Encounter (INDEPENDENT_AMBULATORY_CARE_PROVIDER_SITE_OTHER): Payer: Self-pay | Admitting: Physician Assistant

## 2018-06-30 NOTE — Progress Notes (Signed)
Office Visit Note   Patient: Joshua Massey           Date of Birth: [birth year]           MRN: [id number] Visit Date: 06/28/2018              Requested by: [name], [name], MD [address] [place], [place] [address] PCP: [name], [name], MD  Chief Complaint  Patient presents with  . Left Foot - Follow-up    S/p I&D x 2 with Dr. Ninfa Linden for plantar foot abscess   . Right Foot - Wound Check    Ulcer great toe      HPI: The patient is a 43 year old gentleman who is seen for follow-up following irrigation and debridement of his left foot plantar abscess on 06/10/2018.  Operative cultures from the surgery grew Morganella morganii ,which was primarily sensitive to IV antibiotics only, except for Cipro.  He also has a right great toe ulcer which is been present for at least 6 months as well.  He has been applying some Bactroban to the areas.  He is referred by Dr. Zollie Beckers.   He has multiple medical problems including a severe cardiomyopathy with ejection fraction of less than 20%, poorly controlled diabetes mellitus, chronic kidney disease stage III, and severe protein calorie malnutrition. He completed a course of antibiotics but the left foot wound has continued to heal poorly and he also has developed an ulcer over the right great toe which tracks to the bone. Assessment & Plan: Visit Diagnoses:  1. Diabetic ulcer of left midfoot associated with type 2 diabetes mellitus, with bone involvement without evidence of necrosis ([place])   2. Diabetic ulcer of toe of right foot associated with type 2 diabetes mellitus, with bone involvement without evidence of necrosis ([place])   3. Type 2 diabetes mellitus with diabetic polyneuropathy, with long-term current use of insulin ([place])   4. Non-ischemic cardiomyopathy, EF < 20%   5. CKD (chronic kidney disease), stage III ([place])   6. Severe protein-calorie malnutrition ([place])   7. BMI 40.0-44.9, adult [facility])     Plan: Dr. Sharol Given discussed  with the patient that at that this point we need to return to the OR for repeat debridement of his left plantar surface wound with possible skin graft placement at that time.  We discussed that he will need to remain off of the foot for many weeks.  He was offered surgery tomorrow however he reports that he is the primary caregiver for his children and will have to wait till next week as he will need to make arrangements for the kids.  We also discussed that eventually once he heals his left foot that he will need likely a right great toe amputation as he likely has osteomyelitis of the great toe as the wound tracks to the bone.  We will plan for OR next Wednesday and he will follow-up in the office 1 week following this.  Follow-Up Instructions: Return in about 2 weeks (around 07/12/2018).   Ortho Exam  Patient is alert, oriented, no adenopathy, well-dressed, normal affect, normal respiratory effort. Left plantar surface incision is dehiscing.  There is odor and pink-tan drainage noted.  There is maceration of the peri-incisional tissue and some of the skin was debrided.  There is no clear cellulitis of the foot today.  He does have palpable pedal pulses bilaterally.  The right great toe ulcer is approximately 1 cm diameter but does track  deep to the bone.  The patient has diabetic neuropathy over both feet.  Imaging: No results found. No images are attached to the encounter.  Labs: Lab Results  Component Value Date   HGBA1C 8.5 (H) 06/10/2018   HGBA1C 11.5 (H) 11/28/2017   HGBA1C 9.7 (H) 12/20/2016   REPTSTATUS 06/15/2018 FINAL 06/10/2018   GRAMSTAIN  06/10/2018    RARE WBC PRESENT, PREDOMINANTLY PMN FEW GRAM POSITIVE COCCI IN PAIRS RARE GRAM NEGATIVE RODS    CULT  06/10/2018    FEW MORGANELLA MORGANII MODERATE BACTEROIDES THETAIOTAOMICRON BETA LACTAMASE POSITIVE Performed at [facility], [place] [address], [place], [place] [address]    Terry MORGANII 06/10/2018      Lab Results  Component Value Date   ALBUMIN 1.7 (L) 06/10/2018   ALBUMIN 1.9 (L) 12/07/2017   ALBUMIN 1.9 (L) 12/06/2017    Body mass index is 41.28 kg/m.  Orders:  No orders of the defined types were placed in this encounter.  Meds ordered this encounter  Medications  . traMADol (ULTRAM) 50 MG tablet    Sig: Take 1 tablet (50 mg total) by mouth every 6 (six) hours as needed for moderate pain or severe pain.    Dispense:  28 tablet    Refill:  0     Procedures: No procedures performed  Clinical Data: No additional findings.  ROS:  All other systems negative, except as noted in the HPI. Review of Systems  Objective: Vital Signs: Ht 5\' 11"  (1.803 m)   Wt 296 lb (134.3 kg)   BMI 41.28 kg/m   Specialty Comments:  No specialty comments available.  PMFS History: Patient Active Problem List   Diagnosis Date Noted  . Left foot infection 06/21/2018  . Cellulitis of foot 06/09/2018  . Non-compliant patient 12/18/2017  . Chronic venous hypertension (idiopathic) with ulcer and inflammation of bilateral lower extremity (HCC)   . Hypovitaminosis D 11/27/2017  . Diabetic foot ulcer ([place]) 11/27/2017  . Cellulitis in diabetic foot ([place]) 11/27/2017  . Acute heart failure ([place]) 11/27/2017  . Volume overload 11/27/2017  . Hepatopathy 11/27/2017  . Hypokalemia 05/31/2017  . Diabetic peripheral neuropathy ([place]) 12/20/2016  . Hyperlipidemia associated with type 2 diabetes mellitus ([place]) 11/27/2016  . Shingles 10/11/2016  . Status post peripherally inserted central catheter (PICC) central line placement 09/12/2016  . Medication monitoring encounter 09/12/2016  . Arthritis, septic ([place])   . CKD (chronic kidney disease), stage III ([place])   . AKI (acute kidney injury) ([place])   . Peripheral edema   . Acute renal failure ([place]) 08/17/2016  . Acute systolic CHF (congestive heart failure) ([place]) 08/17/2016  . Type 2 diabetes mellitus with vascular disease ([place]) 10/21/2013  .  Chronic systolic heart failure ([place]) 02/27/2012  . Coronary artery disease, 50% LAD at cath 02/16/12 02/23/2012  . Family history of coronary artery disease 02/23/2012  . Cardiogenic shock ([place]) 02/17/2012  . NSVT (nonsustained ventricular tachycardia) ([place]) 02/16/2012  . Obesity 02/16/2012  . Acute on chronic systolic CHF (congestive heart failure) ([place]) 02/16/2012  . CHF (congestive heart failure) ([place]) 02/15/2012  . Hypertension 02/15/2012  . Non-ischemic cardiomyopathy, EF < 20% 02/15/2012  . TINEA CRURIS 09/25/2009  . ALLERGIC RHINITIS CAUSE UNSPECIFIED 09/25/2009  . SCROTAL ABSCESS 09/17/2009  . Hypertensive heart disease with heart failure ([place]) 01/26/2009   Past Medical History:  Diagnosis Date  . 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 Diabetes mellitus    adult-onset, type 2  .  Exogenous obesity   . GERD (gastroesophageal reflux disease)   . Hypertension     Family History  Problem Relation Age of Onset  . Coronary artery disease Brother        CABG at 26, DM  . Heart disease Brother 79       CAD  . Diabetes Mother   . Hypertension Mother   . Hyperlipidemia Mother   . Heart disease Mother   . Diabetes Father   . Hypertension Father     Past Surgical History:  Procedure Laterality Date  . ANKLE SURGERY  2003   plate and screws  . APPLICATION OF WOUND VAC Left 06/12/2018   Procedure: APPLICATION OF WOUND VAC;  Surgeon: Mcarthur Rossetti, MD;  Location: [place];  Service: Orthopedics;  Laterality: Left;  . CARDIAC CATHETERIZATION  02/16/2012   50% LAD lesion in mid vessel, otherwise no significant obstructive CAD  . HIP PINNING  1990   bilateral  . I&D EXTREMITY Left 06/10/2018   Procedure: IRRIGATION AND DEBRIDEMENT OF FOOT;  Surgeon: Mcarthur Rossetti, MD;  Location: [place];  Service: Orthopedics;  Laterality: Left;  . I&D EXTREMITY Left 06/12/2018   Procedure: REPEAT IRRIGATION AND DEBRIDEMENT LEFT FOOT;  Surgeon:  Mcarthur Rossetti, MD;  Location: [place];  Service: Orthopedics;  Laterality: Left;  . IR GENERIC HISTORICAL  09/01/2016   IR FLUORO GUIDE CV LINE RIGHT 09/01/2016 Marybelle Killings, MD [facility]  . IR GENERIC HISTORICAL  09/01/2016   IR US GUIDE VASC ACCESS RIGHT 09/01/2016 Marybelle Killings, MD [facility]  . IR GENERIC HISTORICAL  09/16/2016   IR REMOVAL TUN CV CATH W/O FL 09/16/2016 Arne Cleveland, MD [facility]  . KNEE ARTHROSCOPY Left 08/30/2016   Procedure: ARTHROSCOPY KNEE;  Surgeon: Renette Butters, MD;  Location: [place];  Service: Orthopedics;  Laterality: Left;  . LEFT AND RIGHT HEART CATHETERIZATION WITH CORONARY ANGIOGRAM N/A 02/16/2012   Procedure: LEFT AND RIGHT HEART CATHETERIZATION WITH CORONARY ANGIOGRAM;  Surgeon: Pixie Casino, MD;  Location: [facility] CATH LAB;  Service: Cardiovascular;  Laterality: N/A;  . TRANSTHORACIC ECHOCARDIOGRAM  08/31/2012   EF 35-40%, no significant wall motion abnormalities, diastolic relaxation abnormality - no longer needed LifeVest; LV cavity size mod dilated with mod conc hypertrophy, systolic function mod reduced; mild MR, LA mod dilated   Social History   Occupational History  . Occupation: Brewing technologist    Comment: [name]  . Occupation: newpaper delivery    Employer: [employer]  Tobacco Use  . Smoking status: Never Smoker  . Smokeless tobacco: Never Used  Substance and Sexual Activity  . Alcohol use: No  . Drug use: No  . Sexual activity: Yes    Birth control/protection: Condom

## 2018-07-02 ENCOUNTER — Ambulatory Visit (INDEPENDENT_AMBULATORY_CARE_PROVIDER_SITE_OTHER): Payer: Self-pay | Admitting: Physician Assistant

## 2018-07-03 ENCOUNTER — Encounter (HOSPITAL_COMMUNITY): Payer: Self-pay | Admitting: *Deleted

## 2018-07-03 ENCOUNTER — Other Ambulatory Visit: Payer: Self-pay

## 2018-07-03 MED ORDER — DEXTROSE 5 % IV SOLN
3.0000 g | INTRAVENOUS | Status: DC
  Filled 2018-07-03: qty 3000

## 2018-07-03 NOTE — Progress Notes (Signed)
Joshua Massey has a history of CHF, DM CRF, cardiomyopathy.  Patient was last seen by  Joshua Massey in 2018. ECHO 2019 , EF was 15-20.  Patient has not followed up in Heart Failure Clinic.  Patient was hospitalizes 06/09/2018- 06/14/2018.  Labs at discharge Hbg 8.5, BUN 80, Creatinine 4.73, A1C was 8.5.  Patient was instructed to follow up with Joshua Massey, recommend to make an appointment with a nephrologist, have BMP repeated and make an appointment with Joshua Massey.  Patient was seen at [facility] office.  Joshua Massey told me he has an appointment with cardiology, I do not find an appointment with [facility].  Joshua Massey states that CBGs run 130- 150. I instructed patient to take 1/2 of Lantus dose at bedtime tonight. I instructed patient to check CBG after awaking and every 2 hours until arrival  to the hospital.  I Instructed patient if CBG is less than 70 to drink 1/2 cup of a clear juice. Recheck CBG in 15 minutes then call pre- op desk at [phone] for further instructions.  I instructed Joshua Massey that if CBG > 2230 to take 1/2 of SS Insulin. I had patient repeat all instructions to me, he was able to repeat instructions I spoke with Joshua Marcie Bal.  Patient will be evaluated in am.

## 2018-07-04 ENCOUNTER — Ambulatory Visit (HOSPITAL_COMMUNITY)
Admission: RE | Admit: 2018-07-04 | Discharge: 2018-07-06 | Disposition: A | Discharge status: Home Health | Payer: BLUE CROSS/BLUE SHIELD | Attending: Orthopedic Surgery | Admitting: Orthopedic Surgery

## 2018-07-04 ENCOUNTER — Encounter (HOSPITAL_COMMUNITY)
Admission: RE | Disposition: A | Discharge status: Home Health | Payer: Self-pay | Source: Home / Self Care | Attending: Orthopedic Surgery

## 2018-07-04 ENCOUNTER — Other Ambulatory Visit: Payer: Self-pay

## 2018-07-04 ENCOUNTER — Encounter (HOSPITAL_COMMUNITY): Payer: Self-pay

## 2018-07-04 ENCOUNTER — Ambulatory Visit (HOSPITAL_COMMUNITY): Payer: BLUE CROSS/BLUE SHIELD | Admitting: Vascular Surgery

## 2018-07-04 ENCOUNTER — Other Ambulatory Visit (HOSPITAL_COMMUNITY): Payer: Self-pay

## 2018-07-04 DIAGNOSIS — M86172 Other acute osteomyelitis, left ankle and foot: Secondary | ICD-10-CM | POA: Diagnosis not present

## 2018-07-04 DIAGNOSIS — Z79899 Other long term (current) drug therapy: Secondary | ICD-10-CM | POA: Diagnosis not present

## 2018-07-04 DIAGNOSIS — L97429 Non-pressure chronic ulcer of left heel and midfoot with unspecified severity: Secondary | ICD-10-CM | POA: Diagnosis not present

## 2018-07-04 DIAGNOSIS — I509 Heart failure, unspecified: Secondary | ICD-10-CM | POA: Diagnosis not present

## 2018-07-04 DIAGNOSIS — E11610 Type 2 diabetes mellitus with diabetic neuropathic arthropathy: Secondary | ICD-10-CM | POA: Insufficient documentation

## 2018-07-04 DIAGNOSIS — I2510 Atherosclerotic heart disease of native coronary artery without angina pectoris: Secondary | ICD-10-CM | POA: Insufficient documentation

## 2018-07-04 DIAGNOSIS — E1122 Type 2 diabetes mellitus with diabetic chronic kidney disease: Secondary | ICD-10-CM | POA: Diagnosis not present

## 2018-07-04 DIAGNOSIS — N189 Chronic kidney disease, unspecified: Secondary | ICD-10-CM | POA: Diagnosis not present

## 2018-07-04 DIAGNOSIS — I130 Hypertensive heart and chronic kidney disease with heart failure and stage 1 through stage 4 chronic kidney disease, or unspecified chronic kidney disease: Secondary | ICD-10-CM | POA: Insufficient documentation

## 2018-07-04 DIAGNOSIS — M86272 Subacute osteomyelitis, left ankle and foot: Secondary | ICD-10-CM

## 2018-07-04 DIAGNOSIS — E1151 Type 2 diabetes mellitus with diabetic peripheral angiopathy without gangrene: Secondary | ICD-10-CM | POA: Diagnosis not present

## 2018-07-04 DIAGNOSIS — Z794 Long term (current) use of insulin: Secondary | ICD-10-CM | POA: Insufficient documentation

## 2018-07-04 DIAGNOSIS — E1169 Type 2 diabetes mellitus with other specified complication: Secondary | ICD-10-CM | POA: Diagnosis not present

## 2018-07-04 DIAGNOSIS — E11621 Type 2 diabetes mellitus with foot ulcer: Secondary | ICD-10-CM | POA: Insufficient documentation

## 2018-07-04 DIAGNOSIS — E1140 Type 2 diabetes mellitus with diabetic neuropathy, unspecified: Secondary | ICD-10-CM | POA: Insufficient documentation

## 2018-07-04 DIAGNOSIS — L97529 Non-pressure chronic ulcer of other part of left foot with unspecified severity: Secondary | ICD-10-CM | POA: Diagnosis present

## 2018-07-04 DIAGNOSIS — Z7901 Long term (current) use of anticoagulants: Secondary | ICD-10-CM | POA: Insufficient documentation

## 2018-07-04 HISTORY — PX: I&D EXTREMITY: SHX5045

## 2018-07-04 HISTORY — DX: Chronic kidney disease, unspecified: N18.9

## 2018-07-04 HISTORY — DX: Other acute osteomyelitis, left ankle and foot: M86.172

## 2018-07-04 LAB — CBC
HEMATOCRIT: 34.4 % — AB (ref 39.0–52.0)
HEMOGLOBIN: 10.4 g/dL — AB (ref 13.0–17.0)
MCH: 28.2 pg (ref 26.0–34.0)
MCHC: 30.2 g/dL (ref 30.0–36.0)
MCV: 93.2 fL (ref 80.0–100.0)
Platelets: 239 10*3/uL (ref 150–400)
RBC: 3.69 MIL/uL — ABNORMAL LOW (ref 4.22–5.81)
RDW: 18.8 % — ABNORMAL HIGH (ref 11.5–15.5)
WBC: 5.6 10*3/uL (ref 4.0–10.5)
nRBC: 0 % (ref 0.0–0.2)

## 2018-07-04 LAB — GLUCOSE, CAPILLARY
Glucose-Capillary: 142 mg/dL — ABNORMAL HIGH (ref 70–99)
Glucose-Capillary: 127 mg/dL — ABNORMAL HIGH (ref 70–99)
Glucose-Capillary: 145 mg/dL — ABNORMAL HIGH (ref 70–99)

## 2018-07-04 LAB — BASIC METABOLIC PANEL
Anion gap: 10 (ref 5–15)
BUN: 63 mg/dL — ABNORMAL HIGH (ref 6–20)
CO2: 20 mmol/L — ABNORMAL LOW (ref 22–32)
Calcium: 8.3 mg/dL — ABNORMAL LOW (ref 8.9–10.3)
Chloride: 113 mmol/L — ABNORMAL HIGH (ref 98–111)
Creatinine, Ser: 3.25 mg/dL — ABNORMAL HIGH (ref 0.61–1.24)
GFR calc Af Amer: 26 mL/min — ABNORMAL LOW (ref >60)
GFR calc non Af Amer: 22 mL/min — ABNORMAL LOW (ref >60)
Glucose, Bld: 140 mg/dL — ABNORMAL HIGH (ref 70–99)
Potassium: 3.7 mmol/L (ref 3.5–5.1)
Sodium: 143 mmol/L (ref 135–145)

## 2018-07-04 LAB — PROTIME-INR
INR: 1.53
Prothrombin Time: 18.2 seconds — ABNORMAL HIGH (ref 11.4–15.2)

## 2018-07-04 SURGERY — IRRIGATION AND DEBRIDEMENT EXTREMITY
Anesthesia: Monitor Anesthesia Care | Laterality: Left

## 2018-07-04 MED ORDER — INSULIN ASPART 100 UNIT/ML ~~LOC~~ SOLN
0.0000 [IU] | Freq: Three times a day (TID) | SUBCUTANEOUS | Status: DC
  Administered 2018-07-05 (×2): 3 [IU] via SUBCUTANEOUS

## 2018-07-04 MED ORDER — METHOCARBAMOL 500 MG PO TABS
500.0000 mg | ORAL_TABLET | Freq: Four times a day (QID) | ORAL | Status: DC | PRN
  Administered 2018-07-05: 500 mg via ORAL
  Filled 2018-07-04: qty 1

## 2018-07-04 MED ORDER — ONDANSETRON HCL 4 MG/2ML IJ SOLN
INTRAMUSCULAR | Status: AC
  Filled 2018-07-04: qty 2

## 2018-07-04 MED ORDER — POLYETHYLENE GLYCOL 3350 17 G PO PACK: 17.0000 g | PACK | Freq: Every day | ORAL | Status: DC | PRN

## 2018-07-04 MED ORDER — FENTANYL CITRATE (PF) 100 MCG/2ML IJ SOLN
INTRAMUSCULAR | Status: AC
  Administered 2018-07-04: 50 ug via INTRAVENOUS
  Filled 2018-07-04: qty 2

## 2018-07-04 MED ORDER — BUPIVACAINE-EPINEPHRINE (PF) 0.5% -1:200000 IJ SOLN
INTRAMUSCULAR | Status: DC | PRN
  Administered 2018-07-04: 20 mL via PERINEURAL
  Administered 2018-07-04: 10 mL via PERINEURAL

## 2018-07-04 MED ORDER — HYDRALAZINE HCL 20 MG/ML IJ SOLN
INTRAMUSCULAR | Status: AC
  Administered 2018-07-04: 10 mg via INTRAVENOUS
  Filled 2018-07-04: qty 1

## 2018-07-04 MED ORDER — DEXTROSE 5 % IV SOLN
INTRAVENOUS | Status: DC | PRN
  Administered 2018-07-04: 3 g via INTRAVENOUS

## 2018-07-04 MED ORDER — LIDOCAINE 2% (20 MG/ML) 5 ML SYRINGE
INTRAMUSCULAR | Status: AC
  Filled 2018-07-04: qty 15

## 2018-07-04 MED ORDER — ONDANSETRON HCL 4 MG/2ML IJ SOLN: 4.0000 mg | Freq: Four times a day (QID) | INTRAMUSCULAR | Status: DC | PRN

## 2018-07-04 MED ORDER — AMIODARONE HCL 100 MG PO TABS
200.0000 mg | ORAL_TABLET | Freq: Two times a day (BID) | ORAL | Status: DC
  Administered 2018-07-04 – 2018-07-06 (×4): 200 mg via ORAL
  Filled 2018-07-04 (×4): qty 2

## 2018-07-04 MED ORDER — ONDANSETRON HCL 4 MG/2ML IJ SOLN
INTRAMUSCULAR | Status: DC | PRN
  Administered 2018-07-04: 4 mg via INTRAVENOUS

## 2018-07-04 MED ORDER — PROMETHAZINE HCL 25 MG/ML IJ SOLN: 6.2500 mg | INTRAMUSCULAR | Status: DC | PRN

## 2018-07-04 MED ORDER — PROPOFOL 10 MG/ML IV BOLUS
INTRAVENOUS | Status: AC
  Filled 2018-07-04: qty 20

## 2018-07-04 MED ORDER — FREESTYLE LIBRE 14 DAY READER DEVI: 1.0000 | Freq: Every day | Status: DC

## 2018-07-04 MED ORDER — 0.9 % SODIUM CHLORIDE (POUR BTL) OPTIME
TOPICAL | Status: DC | PRN
  Administered 2018-07-04: 1000 mL

## 2018-07-04 MED ORDER — CARVEDILOL 25 MG PO TABS
25.0000 mg | ORAL_TABLET | Freq: Two times a day (BID) | ORAL | Status: DC
  Administered 2018-07-05 – 2018-07-06 (×3): 25 mg via ORAL
  Filled 2018-07-04 (×3): qty 1

## 2018-07-04 MED ORDER — OXYCODONE HCL 5 MG PO TABS
5.0000 mg | ORAL_TABLET | ORAL | Status: DC | PRN
  Administered 2018-07-05 – 2018-07-06 (×3): 10 mg via ORAL
  Filled 2018-07-04 (×4): qty 2

## 2018-07-04 MED ORDER — CEFAZOLIN SODIUM-DEXTROSE 2-4 GM/100ML-% IV SOLN
2.0000 g | Freq: Four times a day (QID) | INTRAVENOUS | Status: AC
  Administered 2018-07-04 – 2018-07-05 (×3): 2 g via INTRAVENOUS
  Filled 2018-07-04 (×3): qty 100

## 2018-07-04 MED ORDER — BISACODYL 10 MG RE SUPP: 10.0000 mg | Freq: Every day | RECTAL | Status: DC | PRN

## 2018-07-04 MED ORDER — LIDOCAINE HCL (PF) 1 % IJ SOLN
INTRAMUSCULAR | Status: AC
  Filled 2018-07-04: qty 30

## 2018-07-04 MED ORDER — OXYCODONE HCL 5 MG PO TABS
10.0000 mg | ORAL_TABLET | ORAL | Status: DC | PRN
  Administered 2018-07-05: 10 mg via ORAL

## 2018-07-04 MED ORDER — FENTANYL CITRATE (PF) 100 MCG/2ML IJ SOLN
50.0000 ug | Freq: Once | INTRAMUSCULAR | Status: AC
  Administered 2018-07-04: 50 ug via INTRAVENOUS

## 2018-07-04 MED ORDER — PROPOFOL 10 MG/ML IV BOLUS
INTRAVENOUS | Status: DC | PRN
  Administered 2018-07-04: 50 mg via INTRAVENOUS

## 2018-07-04 MED ORDER — ISOSORB DINITRATE-HYDRALAZINE 20-37.5 MG PO TABS
1.0000 | ORAL_TABLET | Freq: Three times a day (TID) | ORAL | Status: DC
  Administered 2018-07-04 – 2018-07-06 (×5): 1 via ORAL
  Filled 2018-07-04 (×6): qty 1

## 2018-07-04 MED ORDER — SODIUM CHLORIDE 0.9 % IV SOLN
INTRAVENOUS | Status: DC
  Administered 2018-07-04: 20:00:00 via INTRAVENOUS

## 2018-07-04 MED ORDER — METHOCARBAMOL 1000 MG/10ML IJ SOLN
500.0000 mg | Freq: Four times a day (QID) | INTRAVENOUS | Status: DC | PRN
  Filled 2018-07-04: qty 5

## 2018-07-04 MED ORDER — HYDRALAZINE HCL 20 MG/ML IJ SOLN
5.0000 mg | Freq: Once | INTRAMUSCULAR | Status: AC
  Administered 2018-07-04: 5 mg via INTRAVENOUS

## 2018-07-04 MED ORDER — MAGNESIUM CITRATE PO SOLN: 1.0000 | Freq: Once | ORAL | Status: DC | PRN

## 2018-07-04 MED ORDER — DOCUSATE SODIUM 100 MG PO CAPS
100.0000 mg | ORAL_CAPSULE | Freq: Two times a day (BID) | ORAL | Status: DC
  Administered 2018-07-04 – 2018-07-06 (×4): 100 mg via ORAL
  Filled 2018-07-04 (×4): qty 1

## 2018-07-04 MED ORDER — METOCLOPRAMIDE HCL 5 MG/ML IJ SOLN: 5.0000 mg | Freq: Three times a day (TID) | INTRAMUSCULAR | Status: DC | PRN

## 2018-07-04 MED ORDER — LIDOCAINE HCL 1 % IJ SOLN
INTRAMUSCULAR | Status: DC | PRN
  Administered 2018-07-04: 10 mL via INTRADERMAL

## 2018-07-04 MED ORDER — METOCLOPRAMIDE HCL 5 MG PO TABS: 5.0000 mg | ORAL_TABLET | Freq: Three times a day (TID) | ORAL | Status: DC | PRN

## 2018-07-04 MED ORDER — HYDROMORPHONE HCL 1 MG/ML IJ SOLN: 0.5000 mg | INTRAMUSCULAR | Status: DC | PRN

## 2018-07-04 MED ORDER — MIDAZOLAM HCL 2 MG/2ML IJ SOLN
2.0000 mg | Freq: Once | INTRAMUSCULAR | Status: AC
  Administered 2018-07-04: 2 mg via INTRAVENOUS

## 2018-07-04 MED ORDER — FENTANYL CITRATE (PF) 100 MCG/2ML IJ SOLN: 25.0000 ug | INTRAMUSCULAR | Status: DC | PRN

## 2018-07-04 MED ORDER — INSULIN ASPART 100 UNIT/ML ~~LOC~~ SOLN
4.0000 [IU] | Freq: Three times a day (TID) | SUBCUTANEOUS | Status: DC
  Administered 2018-07-05 – 2018-07-06 (×5): 4 [IU] via SUBCUTANEOUS

## 2018-07-04 MED ORDER — HYDRALAZINE HCL 20 MG/ML IJ SOLN
10.0000 mg | Freq: Once | INTRAMUSCULAR | Status: AC
  Administered 2018-07-04: 10 mg via INTRAVENOUS

## 2018-07-04 MED ORDER — CHLORHEXIDINE GLUCONATE 4 % EX LIQD: 60.0000 mL | Freq: Once | CUTANEOUS | Status: DC

## 2018-07-04 MED ORDER — PROPOFOL 500 MG/50ML IV EMUL
INTRAVENOUS | Status: DC | PRN
  Administered 2018-07-04: 75 ug/kg/min via INTRAVENOUS

## 2018-07-04 MED ORDER — ACETAMINOPHEN 325 MG PO TABS: 325.0000 mg | ORAL_TABLET | Freq: Four times a day (QID) | ORAL | Status: DC | PRN

## 2018-07-04 MED ORDER — HYDRALAZINE HCL 20 MG/ML IJ SOLN
INTRAMUSCULAR | Status: AC
  Administered 2018-07-04: 5 mg via INTRAVENOUS
  Filled 2018-07-04: qty 1

## 2018-07-04 MED ORDER — TORSEMIDE 20 MG PO TABS
60.0000 mg | ORAL_TABLET | Freq: Every day | ORAL | Status: DC
  Administered 2018-07-05 – 2018-07-06 (×2): 60 mg via ORAL
  Filled 2018-07-04 (×2): qty 3

## 2018-07-04 MED ORDER — SODIUM CHLORIDE 0.9 % IR SOLN
Status: DC | PRN
  Administered 2018-07-04: 3000 mL

## 2018-07-04 MED ORDER — MIDAZOLAM HCL 2 MG/2ML IJ SOLN
INTRAMUSCULAR | Status: AC
  Administered 2018-07-04: 2 mg via INTRAVENOUS
  Filled 2018-07-04: qty 2

## 2018-07-04 MED ORDER — ONDANSETRON HCL 4 MG PO TABS: 4.0000 mg | ORAL_TABLET | Freq: Four times a day (QID) | ORAL | Status: DC | PRN

## 2018-07-04 MED ORDER — SODIUM CHLORIDE 0.9 % IV SOLN
INTRAVENOUS | Status: DC
  Administered 2018-07-04: 12:00:00 via INTRAVENOUS

## 2018-07-04 MED ORDER — LACTATED RINGERS IV SOLN: INTRAVENOUS | Status: DC

## 2018-07-04 SURGICAL SUPPLY — 37 items
BLADE SURG 21 STRL SS (BLADE) ×3 IMPLANT
BNDG COHESIVE 6X5 TAN STRL LF (GAUZE/BANDAGES/DRESSINGS) IMPLANT
BNDG GAUZE ELAST 4 BULKY (GAUZE/BANDAGES/DRESSINGS) ×2 IMPLANT
COVER SURGICAL LIGHT HANDLE (MISCELLANEOUS) ×6 IMPLANT
COVER WAND RF STERILE (DRAPES) ×3 IMPLANT
DRAPE INCISE IOBAN 66X45 STRL (DRAPES) ×2 IMPLANT
DRAPE U-SHAPE 47X51 STRL (DRAPES) ×3 IMPLANT
DRESSING VERAFLO CLEANSE CC (GAUZE/BANDAGES/DRESSINGS) IMPLANT
DRSG ADAPTIC 3X8 NADH LF (GAUZE/BANDAGES/DRESSINGS) ×1 IMPLANT
DRSG VERAFLO CLEANSE CC (GAUZE/BANDAGES/DRESSINGS) ×3
DURAPREP 26ML APPLICATOR (WOUND CARE) ×3 IMPLANT
ELECT REM PT RETURN 9FT ADLT (ELECTROSURGICAL)
ELECTRODE REM PT RTRN 9FT ADLT (ELECTROSURGICAL) IMPLANT
GAUZE SPONGE 4X4 12PLY STRL (GAUZE/BANDAGES/DRESSINGS) ×1 IMPLANT
GLOVE BIOGEL PI IND STRL 9 (GLOVE) ×1 IMPLANT
GLOVE BIOGEL PI INDICATOR 9 (GLOVE) ×2
GLOVE SURG ORTHO 9.0 STRL STRW (GLOVE) ×3 IMPLANT
GOWN STRL REUS W/ TWL XL LVL3 (GOWN DISPOSABLE) ×2 IMPLANT
GOWN STRL REUS W/TWL XL LVL3 (GOWN DISPOSABLE) ×4
HANDPIECE INTERPULSE COAX TIP (DISPOSABLE) ×2
IV NS IRRIG 3000ML ARTHROMATIC (IV SOLUTION) ×2 IMPLANT
KIT BASIN OR (CUSTOM PROCEDURE TRAY) ×3 IMPLANT
KIT TURNOVER KIT B (KITS) ×3 IMPLANT
MANIFOLD NEPTUNE II (INSTRUMENTS) ×3 IMPLANT
NS IRRIG 1000ML POUR BTL (IV SOLUTION) ×3 IMPLANT
PACK ORTHO EXTREMITY (CUSTOM PROCEDURE TRAY) ×3 IMPLANT
PAD ARMBOARD 7.5X6 YLW CONV (MISCELLANEOUS) ×6 IMPLANT
SET HNDPC FAN SPRY TIP SCT (DISPOSABLE) IMPLANT
STOCKINETTE IMPERVIOUS 9X36 MD (GAUZE/BANDAGES/DRESSINGS) IMPLANT
SUT ETHILON 2 0 PSLX (SUTURE) ×4 IMPLANT
SWAB COLLECTION DEVICE MRSA (MISCELLANEOUS) ×3 IMPLANT
SWAB CULTURE ESWAB REG 1ML (MISCELLANEOUS) IMPLANT
TOWEL OR 17X26 10 PK STRL BLUE (TOWEL DISPOSABLE) ×3 IMPLANT
TUBE CONNECTING 12'X1/4 (SUCTIONS) ×1
TUBE CONNECTING 12X1/4 (SUCTIONS) ×2 IMPLANT
WND VAC CANISTER 500ML (MISCELLANEOUS) ×2 IMPLANT
YANKAUER SUCT BULB TIP NO VENT (SUCTIONS) ×3 IMPLANT

## 2018-07-04 NOTE — Anesthesia Postprocedure Evaluation (Signed)
Anesthesia Post Note  Patient: Joshua Massey  Procedure(s) Performed: DEBRIDEMENT OF LEFT FOOT (Left )     Patient location during evaluation: PACU Anesthesia Type: Regional Level of consciousness: awake and alert Pain management: pain level controlled Vital Signs Assessment: post-procedure vital signs reviewed and stable Respiratory status: spontaneous breathing, nonlabored ventilation and respiratory function stable Cardiovascular status: blood pressure returned to baseline and stable Postop Assessment: no apparent nausea or vomiting Anesthetic complications: no    Last Vitals:  Vitals:   07/04/18 1540 07/04/18 1555  BP: (!) 142/98 (!) 153/100  Pulse: 76 75  Resp: 12 17  Temp:    SpO2: 99% 96%    Last Pain:  Vitals:   07/04/18 1605  TempSrc:   PainSc: 0-No pain                 Brennan Bailey

## 2018-07-04 NOTE — Transfer of Care (Signed)
Immediate Anesthesia Transfer of Care Note  Patient: Joshua Massey  Procedure(s) Performed: DEBRIDEMENT OF LEFT FOOT (Left )  Patient Location: PACU  Anesthesia Type:MAC and Regional  Level of Consciousness: awake, alert  and oriented  Airway & Oxygen Therapy: Patient Spontanous Breathing and Patient connected to face mask oxygen  Post-op Assessment: Report given to RN and Post -op Vital signs reviewed and stable  Post vital signs: Reviewed and stable  Last Vitals:  Vitals Value Taken Time  BP 147/92 07/04/2018  3:23 PM  Temp    Pulse 71 07/04/2018  3:27 PM  Resp 14 07/04/2018  3:27 PM  SpO2 100 % 07/04/2018  3:27 PM  Vitals shown include unvalidated device data.  Last Pain:  Vitals:   07/04/18 1138  TempSrc:   PainSc: 0-No pain         Complications: No apparent anesthesia complications

## 2018-07-04 NOTE — H&P (Signed)
Joshua Massey is an 43 y.o. male.   Chief Complaint: large wound left foot HPI: patient is a diabetic with insensate neuropathy, CHF, status post foot salvage with multiple debridements left foot with persistent ulceration  Past Medical History:  Diagnosis Date  . 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 Chronic kidney disease   . Diabetes mellitus    adult-onset, type 2  . Exogenous obesity   . GERD (gastroesophageal reflux disease)   . Hypertension     Past Surgical History:  Procedure Laterality Date  . ANKLE SURGERY  2003   plate and screws  . APPLICATION OF WOUND VAC Left 06/12/2018   Procedure: APPLICATION OF WOUND VAC;  Surgeon: Mcarthur Rossetti, MD;  Location: [place];  Service: Orthopedics;  Laterality: Left;  . CARDIAC CATHETERIZATION  02/16/2012   50% LAD lesion in mid vessel, otherwise no significant obstructive CAD  . HIP PINNING  1990   bilateral  . I&D EXTREMITY Left 06/10/2018   Procedure: IRRIGATION AND DEBRIDEMENT OF FOOT;  Surgeon: Mcarthur Rossetti, MD;  Location: [place];  Service: Orthopedics;  Laterality: Left;  . I&D EXTREMITY Left 06/12/2018   Procedure: REPEAT IRRIGATION AND DEBRIDEMENT LEFT FOOT;  Surgeon: Mcarthur Rossetti, MD;  Location: [place];  Service: Orthopedics;  Laterality: Left;  . IR GENERIC HISTORICAL  09/01/2016   IR FLUORO GUIDE CV LINE RIGHT 09/01/2016 Marybelle Killings, MD [facility]  . IR GENERIC HISTORICAL  09/01/2016   IR US GUIDE VASC ACCESS RIGHT 09/01/2016 Marybelle Killings, MD [facility]  . IR GENERIC HISTORICAL  09/16/2016   IR REMOVAL TUN CV CATH W/O FL 09/16/2016 Arne Cleveland, MD [facility]  . KNEE ARTHROSCOPY Left 08/30/2016   Procedure: ARTHROSCOPY KNEE;  Surgeon: Renette Butters, MD;  Location: [place];  Service: Orthopedics;  Laterality: Left;  . LEFT AND RIGHT HEART CATHETERIZATION WITH CORONARY ANGIOGRAM N/A 02/16/2012   Procedure: LEFT AND RIGHT HEART CATHETERIZATION WITH CORONARY  ANGIOGRAM;  Surgeon: Pixie Casino, MD;  Location: [facility] CATH LAB;  Service: Cardiovascular;  Laterality: N/A;  . TRANSTHORACIC ECHOCARDIOGRAM  08/31/2012   EF 35-40%, no significant wall motion abnormalities, diastolic relaxation abnormality - no longer needed LifeVest; LV cavity size mod dilated with mod conc hypertrophy, systolic function mod reduced; mild MR, LA mod dilated    Family History  Problem Relation Age of Onset  . Coronary artery disease Brother        CABG at 41, DM  . Heart disease Brother 75       CAD  . Diabetes Mother   . Hypertension Mother   . Hyperlipidemia Mother   . Heart disease Mother   . Diabetes Father   . Hypertension Father    Social History:  reports that he has never smoked. He has never used smokeless tobacco. He reports that he does not drink alcohol or use drugs.  Allergies: No Known Allergies  No medications prior to admission.    No results found for this or any previous visit (from the past 48 hour(s)). No results found.  Review of Systems  All other systems reviewed and are negative.   There were no vitals taken for this visit. Physical Exam  Large necrotic wound left foot with exposed tendon no exposed bone, Hgb A1c ranging from 15.0 to 8.5, palpable pulses, no cellulitis no abscess Assessment/Plan Large wound left foot.  Plan: excisional debridement left foot of skin soft tissue, tendon with application  split thickness skin graft and wound vac.  Newt Minion, MD 07/04/2018, 7:35 AM

## 2018-07-04 NOTE — Op Note (Signed)
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  PROCEDURE:  DEBRIDEMENT OF LEFT FOOT, excision of skin and soft tissue muscle and bone of the medial cuneiform. Local tissue rearrangement for wound closure 5 x 7 cm. Application of Praveena wound VAC cleanse choice  SURGEON:  Newt Minion, MD  PHYSICIAN ASSISTANT:None ANESTHESIA:   General  PREOPERATIVE INDICATIONS:  Joshua Massey is a  43 y.o. male with a diagnosis of Left Foot Plantar Ulcer who failed conservative measures and elected for surgical management.    The risks benefits and alternatives were discussed with the patient preoperatively including but not limited to the risks of infection, bleeding, nerve injury, cardiopulmonary complications, the need for revision surgery, among others, and the patient was willing to proceed.  OPERATIVE IMPLANTS: Cleanse choice Praveena wound VAC.  @ENCIMAGES @  OPERATIVE FINDINGS: Patient had infection involving the medial cuneiform with a large deep ulcer.  OPERATIVE PROCEDURE: Patient was brought the operating room after undergoing a regional block.  After adequate levels anesthesia were obtained patient's left lower extremity was prepped using DuraPrep draped into a sterile field a timeout was called.  Elliptical incision was made around the previous ulcer.  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  Partial resection of the medial cuneiform was performed further debridement of skin and soft tissue and muscle and fascia was also performed.  Wound was irrigated with normal saline electrocautery was used for hemostasis approximately 50% of the medial cuneiform was excised.  Local tissue rearrangement was used to close the wound 5 x 7 cm.  A cleanse choice Praveena VAC was  applied this had a good suction fit patient was wrapped in compression wrap from the metatarsal heads up to the tibial tubercle secondary to massive venous insufficiency swelling.  Patient was taken to PACU in stable conditions.   DISCHARGE PLANNING:  Antibiotic duration: Continue antibiotics 24 hours  Weightbearing: Nonweightbearing on the left  Pain medication: Opioid pathway  Dressing care/ Wound VAC: We will change wound VAC in the office in 1 week  Ambulatory devices: Walker or crutches  Discharge to: Discharge to home in 24 hours  Follow-up: In the office 1 week post operative.

## 2018-07-04 NOTE — Anesthesia Procedure Notes (Signed)
Anesthesia Regional Block: Adductor canal block   Pre-Anesthetic Checklist: ,, timeout performed, Correct Patient, Correct Site, Correct Laterality, Correct Procedure, Correct Position, site marked, Risks and benefits discussed, pre-op evaluation,  At surgeon's request and post-op pain management  Laterality: Left  Prep: Maximum Sterile Barrier Precautions used, chloraprep       Needles:  Injection technique: Single-shot  Needle Type: Echogenic Stimulator Needle     Needle Length: 9cm  Needle Gauge: 22     Additional Needles:   Procedures:,,,, ultrasound used (permanent image in chart),,,,  Narrative:  Start time: 07/04/2018 2:04 PM End time: 07/04/2018 2:07 PM Injection made incrementally with aspirations every 5 mL.  Performed by: Personally  Anesthesiologist: Brennan Bailey, MD  Additional Notes: Risks, benefits, and alternative discussed. Patient gave consent for procedure. Patient prepped and draped in sterile fashion. Sedation administered, patient remains easily responsive to voice. Relevant anatomy identified with ultrasound guidance. Local anesthetic given in 5cc increments with no signs or symptoms of intravascular injection. No pain or paraesthesias with injection. Patient monitored throughout procedure with signs of LAST or immediate complications. Tolerated well. Ultrasound image placed in chart.  Tawny Asal, MD

## 2018-07-04 NOTE — Anesthesia Preprocedure Evaluation (Addendum)
Anesthesia Evaluation  Patient identified by MRN, date of birth, ID band Patient awake    Reviewed: Allergy & Precautions, NPO status , Patient's Chart, lab work & pertinent test results, reviewed documented beta blocker date and time   History of Anesthesia Complications Negative for: history of anesthetic complications  Airway Mallampati: II  TM Distance: >3 FB Neck ROM: Full    Dental  (+) Dental Advisory Given, Chipped,    Pulmonary neg pulmonary ROS,    Pulmonary exam normal breath sounds clear to auscultation       Cardiovascular hypertension, Pt. on medications and Pt. on home beta blockers + CAD, + Peripheral Vascular Disease and +CHF  Normal cardiovascular exam Rhythm:Regular Rate:Normal  TTE 6/19: EF 15-20%, mild LVH, diffuse hypokinesis, grade 2 diastolic dysfunction, mild MR, severe LAE, severely reduced RV function, mild TR, PASP 42      Neuro/Psych negative neurological ROS     GI/Hepatic Neg liver ROS, GERD  ,  Endo/Other  diabetes, Type 2, Insulin DependentMorbid obesity  Renal/GU Renal InsufficiencyRenal disease     Musculoskeletal   Abdominal   Peds  Hematology negative hematology ROS (+)   Anesthesia Other Findings Day of surgery medications reviewed with the patient.  Reproductive/Obstetrics                            Anesthesia Physical Anesthesia Plan  ASA: IV  Anesthesia Plan: Regional and MAC   Post-op Pain Management:    Induction:   PONV Risk Score and Plan: Treatment may vary due to age or medical condition and Propofol infusion  Airway Management Planned: Natural Airway and Simple Face Mask  Additional Equipment: Arterial line  Intra-op Plan:   Post-operative Plan:   Informed Consent: I have reviewed the patients History and Physical, chart, labs and discussed the procedure including the risks, benefits and alternatives for the proposed anesthesia  with the patient or authorized representative who has indicated his/her understanding and acceptance.     Dental advisory given  Plan Discussed with: CRNA  Anesthesia Plan Comments:       Anesthesia Quick Evaluation

## 2018-07-04 NOTE — Anesthesia Procedure Notes (Signed)
Anesthesia Regional Block: Popliteal block   Pre-Anesthetic Checklist: ,, timeout performed, Correct Patient, Correct Site, Correct Laterality, Correct Procedure, Correct Position, site marked, Risks and benefits discussed, pre-op evaluation,  At surgeon's request and post-op pain management  Laterality: Left  Prep: Maximum Sterile Barrier Precautions used, chloraprep       Needles:  Injection technique: Single-shot  Needle Type: Echogenic Stimulator Needle     Needle Length: 9cm  Needle Gauge: 22     Additional Needles:   Procedures:,,,, ultrasound used (permanent image in chart),,,,  Narrative:  Start time: 07/04/2018 1:55 PM End time: 07/04/2018 1:58 PM Injection made incrementally with aspirations every 5 mL.  Performed by: Personally  Anesthesiologist: Brennan Bailey, MD  Additional Notes: Risks, benefits, and alternative discussed. Patient gave consent for procedure. Patient prepped and draped in sterile fashion. Sedation administered, patient remains easily responsive to voice. Relevant anatomy identified with ultrasound guidance. Local anesthetic given in 5cc increments with no signs or symptoms of intravascular injection. No pain or paraesthesias with injection. Patient monitored throughout procedure with signs of LAST or immediate complications. Tolerated well. Ultrasound image placed in chart.  Tawny Asal, MD

## 2018-07-04 NOTE — Anesthesia Procedure Notes (Signed)
Arterial Line Insertion Start/End1/15/2020 2:30 PM, 07/04/2018 2:35 PM Performed by: Carney Living, CRNA, CRNA  Patient location: Pre-op. Preanesthetic checklist: patient identified, IV checked, site marked, risks and benefits discussed, surgical consent, monitors and equipment checked, pre-op evaluation, timeout performed and anesthesia consent Lidocaine 1% used for infiltration Left, radial was placed Catheter size: 20 G Hand hygiene performed , maximum sterile barriers used  and Seldinger technique used Allen's test indicative of satisfactory collateral circulation Attempts: 1 Procedure performed without using ultrasound guided technique. Following insertion, Biopatch. Post procedure assessment: unchanged  Patient tolerated the procedure well with no immediate complications.

## 2018-07-05 ENCOUNTER — Encounter (HOSPITAL_COMMUNITY): Payer: Self-pay | Admitting: Orthopedic Surgery

## 2018-07-05 DIAGNOSIS — E1169 Type 2 diabetes mellitus with other specified complication: Secondary | ICD-10-CM | POA: Diagnosis not present

## 2018-07-05 LAB — GLUCOSE, CAPILLARY
Glucose-Capillary: 175 mg/dL — ABNORMAL HIGH (ref 70–99)
Glucose-Capillary: 107 mg/dL — ABNORMAL HIGH (ref 70–99)
Glucose-Capillary: 169 mg/dL — ABNORMAL HIGH (ref 70–99)
Glucose-Capillary: 98 mg/dL (ref 70–99)

## 2018-07-05 NOTE — Evaluation (Signed)
Physical Therapy Evaluation Patient Details Name: Joshua Massey MRN: [id number] DOB: 1975/10/30 Today's Date: 07/05/2018   History of Present Illness  Patient is a 43 y/o male s/p DEBRIDEMENT OF LEFT FOOT, excision of skin and soft tissue muscle and bone of the medial cuneiform on 07/04/18. PMH significant for CHF, CKD, DM, GERD, HTN.     Clinical Impression  Patient admitted with the above listed diagnosis. Patient reports Mod I with mobility prior to admission. Patient now NWB on L LE with required use of RW for OOB mobility. Min guard throughout for safety with verbal cueing for safety, sequencing, and to maintain NWB status. Will also benefit from knee scooter for mobility. PT to follow acutely to maximize safe and independent functional mobility.     Follow Up Recommendations No PT follow up    Equipment Recommendations  Rolling walker with 5" wheels(knee scooter)    Recommendations for Other Services       Precautions / Restrictions Precautions Precautions: Fall Required Braces or Orthoses: Other Brace Other Brace: post op shoe Restrictions Weight Bearing Restrictions: Yes LLE Weight Bearing: Non weight bearing      Mobility  Bed Mobility Overal bed mobility: Modified Independent             General bed mobility comments: use of bed rails  Transfers Overall transfer level: Needs assistance Equipment used: Rolling walker (2 wheeled) Transfers: Sit to/from [facility] Sit to Stand: Min guard Stand pivot transfers: Min guard       General transfer comment: min guard with cueing for hand placement as well as to maintain NWB at LLE  Ambulation/Gait Ambulation/Gait assistance: Min guard Gait Distance (Feet): 15 Feet Assistive device: Rolling walker (2 wheeled) Gait Pattern/deviations: (hop to ) Gait velocity: decreased   General Gait Details: hop to pattern with RW to maintain NWB status; mild unsteadiness; will benefit from knee  scooter  Stairs            Wheelchair Mobility    Modified Rankin (Stroke Patients Only)       Balance Overall balance assessment: Mild deficits observed, not formally tested                                           Pertinent Vitals/Pain Pain Assessment: 0-10 Pain Score: 2  Pain Location: L foot Pain Descriptors / Indicators: Tingling;Sharp Pain Intervention(s): Limited activity within patient's tolerance;Monitored during session;Premedicated before session;Repositioned    Home Living Family/patient expects to be discharged to:: Private residence Living Arrangements: Spouse/significant other Available Help at Discharge: Family;Available PRN/intermittently Type of Home: Apartment Home Access: Stairs to enter(1 step (curb)) Entrance Stairs-Rails: Right   Home Layout: One level Home Equipment: Cane - single point      Prior Function Level of Independence: Independent         Comments: works for [employer]     Hand Dominance   Dominant Hand: Right    Extremity/Trunk Assessment   Upper Extremity Assessment Upper Extremity Assessment: Overall WFL for tasks assessed    Lower Extremity Assessment Lower Extremity Assessment: Generalized weakness;LLE deficits/detail LLE Deficits / Details: expected post-op pain and weakness    Cervical / Trunk Assessment Cervical / Trunk Assessment: Normal  Communication   Communication: No difficulties  Cognition Arousal/Alertness: Awake/alert Behavior During Therapy: WFL for tasks assessed/performed Overall Cognitive Status: Within Functional Limits for tasks assessed  General Comments      Exercises     Assessment/Plan    PT Assessment Patient needs continued PT services  PT Problem List Decreased strength;Decreased activity tolerance;Decreased balance;Decreased mobility;Decreased knowledge of use of DME;Decreased safety awareness        PT Treatment Interventions DME instruction;Gait training;Stair training;Functional mobility training;Therapeutic activities;Therapeutic exercise;Balance training;Patient/family education    PT Goals (Current goals can be found in the Care Plan section)  Acute Rehab PT Goals Patient Stated Goal: go home tomorrow PT Goal Formulation: With patient Time For Goal Achievement: 07/19/18 Potential to Achieve Goals: Good    Frequency Min 3X/week   Barriers to discharge        Co-evaluation               AM-PAC PT "6 Clicks" Mobility  Outcome Measure Help needed turning from your back to your side while in a flat bed without using bedrails?: None Help needed moving from lying on your back to sitting on the side of a flat bed without using bedrails?: None Help needed moving to and from a bed to a chair (including a wheelchair)?: A Little Help needed standing up from a chair using your arms (e.g., wheelchair or bedside chair)?: A Little Help needed to walk in hospital room?: A Little Help needed climbing 3-5 steps with a railing? : A Lot 6 Click Score: 19    End of Session Equipment Utilized During Treatment: Gait belt Activity Tolerance: Patient tolerated treatment well Patient left: in chair;with call bell/phone within reach Nurse Communication: Mobility status PT Visit Diagnosis: Unsteadiness on feet (R26.81);Other abnormalities of gait and mobility (R26.89);Muscle weakness (generalized) (M62.81)    Time: 2263-3354 PT Time Calculation (min) (ACUTE ONLY): 22 min   Charges:   PT Evaluation $PT Eval Moderate Complexity: 1 Mod         Lanney Gins, PT, DPT Supplemental Physical Therapist 07/05/18 10:26 AM Pager: [phone] Office: [phone]

## 2018-07-05 NOTE — Care Management Note (Addendum)
Case Management Note  Patient Details  Name: Joshua Massey MRN: [id number] Date of Birth: 1976-01-20  Subjective/Objective:                 Osteo L ankle and foot proveena VAC   Action/Plan:  Spoke w patient at bedside. He states that he lives at home with his GF and two kids. He states that the doctor told him he will have large VAC her for 1 day then transition to [facility] for DC (CM did not see one in room, this may have to be obtained from 6N or OR prior to DC). He states he will Dc tomorrow.  Requested RW to be delivered to room prior to DC  Expected Discharge Date:                  Expected Discharge Plan:  [place]  In-House Referral:     Discharge planning Services  CM Consult  Post Acute Care Choice:    Choice offered to:  Patient  DME Arranged:    DME Agency:     HH Arranged:    [place] Agency:     Status of Service:  In process, will continue to follow  If discussed at Long Length of Stay Meetings, dates discussed:    Additional Comments:  Carles Collet, RN 07/05/2018, 11:50 AM

## 2018-07-05 NOTE — Progress Notes (Signed)
Orthopedic Tech Progress Note Patient Details:  Joshua Massey Sep 03, 1975 [id number]   Brought him a size large shoe Ortho Devices Type of Ortho Device: Postop shoe/boot Ortho Device/Splint Location: LLE Ortho Device/Splint Interventions: Adjustment, Application, Ordered   Post Interventions Patient Tolerated: Well Instructions Provided: Care of device, Adjustment of device   [name] 07/05/2018, 7:36 AM

## 2018-07-06 ENCOUNTER — Other Ambulatory Visit (INDEPENDENT_AMBULATORY_CARE_PROVIDER_SITE_OTHER): Payer: Self-pay | Admitting: Physician Assistant

## 2018-07-06 DIAGNOSIS — E1169 Type 2 diabetes mellitus with other specified complication: Secondary | ICD-10-CM | POA: Diagnosis not present

## 2018-07-06 LAB — GLUCOSE, CAPILLARY
Glucose-Capillary: 103 mg/dL — ABNORMAL HIGH (ref 70–99)
Glucose-Capillary: 107 mg/dL — ABNORMAL HIGH (ref 70–99)

## 2018-07-06 MED ORDER — ATORVASTATIN CALCIUM 10 MG PO TABS: 10.0000 mg | ORAL_TABLET | Freq: Every day | ORAL | 0 refills | Status: DC

## 2018-07-06 MED ORDER — OXYCODONE HCL 5 MG PO TABS: 5.0000 mg | ORAL_TABLET | ORAL | 0 refills | Status: AC | PRN

## 2018-07-06 MED ORDER — AMIODARONE HCL 200 MG PO TABS: 200.0000 mg | ORAL_TABLET | Freq: Two times a day (BID) | ORAL | 0 refills | Status: DC

## 2018-07-06 NOTE — Progress Notes (Signed)
Physical Therapy Treatment Patient Details Name: Joshua Massey MRN: [id number] DOB: [birth year] Today's Date: 07/06/2018    History of Present Illness Patient is a 43 y/o male s/p DEBRIDEMENT OF LEFT FOOT, excision of skin and soft tissue muscle and bone of the medial cuneiform on 07/04/18. PMH significant for CHF, CKD, DM, GERD, HTN.     PT Comments    Patient seen for mobility progression and to stair train for 1 step simulating curb at home. Patient education on safety and sequencing both verbally and with demonstration with good understanding. Patient requiring Min guard/Min A for safety and stability. Consistent cueing throughout to maintain NWB, however patient at times placing weight through heel during mobility. Will continue to recommend knee scooter/walker at discharge for greater ease of mobility.     Follow Up Recommendations  No PT follow up     Equipment Recommendations  Rolling walker with 5" wheels(knee scooter)    Recommendations for Other Services       Precautions / Restrictions Precautions Precautions: Fall Required Braces or Orthoses: Other Brace Other Brace: post op shoe Restrictions Weight Bearing Restrictions: Yes LLE Weight Bearing: Non weight bearing    Mobility  Bed Mobility Overal bed mobility: Modified Independent             General bed mobility comments: use of bed rails  Transfers Overall transfer level: Needs assistance Equipment used: Rolling walker (2 wheeled) Transfers: Sit to/from [facility] Sit to Stand: Min guard Stand pivot transfers: Min guard       General transfer comment: min guard for safety and hand placement; consistent cueing for safety and to maintain WB status  Ambulation/Gait Ambulation/Gait assistance: Min guard Gait Distance (Feet): 10 Feet Assistive device: Rolling walker (2 wheeled) Gait Pattern/deviations: (hop to ) Gait velocity: decreased   General Gait Details: hop to pattern with  cueing to maintain WB status; at times placing weight through heel of foot even with cueing   Stairs Stairs: Yes Stairs assistance: Min assist;Min guard Stair Management: With walker   General stair comments: 1 step to simulate curb at home that he has to nvaigate; up/down x 2 reps with nuring in room for safety; cueing for WB status   Wheelchair Mobility    Modified Rankin (Stroke Patients Only)       Balance Overall balance assessment: Mild deficits observed, not formally tested                                          Cognition Arousal/Alertness: Awake/alert Behavior During Therapy: WFL for tasks assessed/performed Overall Cognitive Status: Within Functional Limits for tasks assessed                                        Exercises      General Comments        Pertinent Vitals/Pain Pain Assessment: 0-10 Pain Score: 5  Pain Location: L foot Pain Descriptors / Indicators: Tingling;Sharp Pain Intervention(s): Limited activity within patient's tolerance;Monitored during session;Repositioned    Home Living                      Prior Function            PT Goals (current goals can now be found in the  care plan section) Acute Rehab PT Goals Patient Stated Goal: go home today PT Goal Formulation: With patient Time For Goal Achievement: 07/19/18 Potential to Achieve Goals: Good Progress towards PT goals: Progressing toward goals    Frequency    Min 3X/week      PT Plan Current plan remains appropriate    Co-evaluation              AM-PAC PT "6 Clicks" Mobility   Outcome Measure  Help needed turning from your back to your side while in a flat bed without using bedrails?: None Help needed moving from lying on your back to sitting on the side of a flat bed without using bedrails?: None Help needed moving to and from a bed to a chair (including a wheelchair)?: A Little Help needed standing up from a  chair using your arms (e.g., wheelchair or bedside chair)?: A Little Help needed to walk in hospital room?: A Little Help needed climbing 3-5 steps with a railing? : A Lot 6 Click Score: 19    End of Session Equipment Utilized During Treatment: Gait belt Activity Tolerance: Patient tolerated treatment well Patient left: in chair;with call bell/phone within reach Nurse Communication: Mobility status PT Visit Diagnosis: Unsteadiness on feet (R26.81);Other abnormalities of gait and mobility (R26.89);Muscle weakness (generalized) (M62.81)     Time: 0350-0938 PT Time Calculation (min) (ACUTE ONLY): 19 min  Charges:  $Gait Training: 8-22 mins                      Lanney Gins, PT, DPT Supplemental Physical Therapist 07/06/18 11:30 AM Pager: [phone] Office: [phone]

## 2018-07-06 NOTE — Discharge Instructions (Signed)
Keep Prevena VAC plugged into wall outlet at all times except when mobilizing to bathroom.  Keep left foot elevated as much as possible.   Information on my medicine - ELIQUIS (apixaban)  This medication education was reviewed with me or my healthcare representative as part of my discharge preparation.    Why was Eliquis prescribed for you? Eliquis was prescribed for you to reduce the risk of a blood clot forming that can cause a stroke if you have a medical condition called atrial fibrillation (a type of irregular heartbeat).  What do You need to know about Eliquis ? Take your Eliquis TWICE DAILY - one tablet in the morning and one tablet in the evening with or without food. If you have difficulty swallowing the tablet whole please discuss with your pharmacist how to take the medication safely.  Take Eliquis exactly as prescribed by your doctor and DO NOT stop taking Eliquis without talking to the doctor who prescribed the medication.  Stopping may increase your risk of developing a stroke.  Refill your prescription before you run out.  After discharge, you should have regular check-up appointments with your healthcare provider that is prescribing your Eliquis.  In the future your dose may need to be changed if your kidney function or weight changes by a significant amount or as you get older.  What do you do if you miss a dose? If you miss a dose, take it as soon as you remember on the same day and resume taking twice daily.  Do not take more than one dose of ELIQUIS at the same time to make up a missed dose.  Important Safety Information A possible side effect of Eliquis is bleeding. You should call your healthcare provider right away if you experience any of the following: ? Bleeding from an injury or your nose that does not stop. ? Unusual colored urine (red or dark brown) or unusual colored stools (red or black). ? Unusual bruising for unknown reasons. ? A serious fall or if  you hit your head (even if there is no bleeding).  Some medicines may interact with Eliquis and might increase your risk of bleeding or clotting while on Eliquis. To help avoid this, consult your healthcare provider or pharmacist prior to using any new prescription or non-prescription medications, including herbals, vitamins, non-steroidal anti-inflammatory drugs (NSAIDs) and supplements.  This website has more information on Eliquis (apixaban): http://[email or web address].com/eliquis/home

## 2018-07-06 NOTE — Discharge Summary (Addendum)
Discharge Diagnoses:  Active Problems:   Subacute osteomyelitis, left ankle and foot ([place])   Osteomyelitis of foot, left, acute ([place])   Surgeries: Procedure(s): DEBRIDEMENT OF LEFT FOOT on 07/04/2018    Consultants:   Discharged Condition: Improved  Hospital Course: Joshua Massey is an 43 y.o. male who was admitted 07/04/2018 with a chief complaint of left foot pain and ulceration, with a final diagnosis of Left Foot Plantar Ulcer.  Patient was brought to the operating room on 07/04/2018 and underwent Procedure(s): DEBRIDEMENT OF LEFT FOOT.    Patient was given perioperative antibiotics:  Anti-infectives (From admission, onward)   Start     Dose/Rate Route Frequency Ordered Stop   07/04/18 2100  ceFAZolin (ANCEF) IVPB 2g/100 mL premix     2 g 200 mL/hr over 30 Minutes Intravenous Every 6 hours 07/04/18 1853 07/05/18 0912   07/04/18 0600  ceFAZolin (ANCEF) 3 g in dextrose 5 % 50 mL IVPB  Status:  Discontinued     3 g 100 mL/hr over 30 Minutes Intravenous On call to O.R. 07/03/18 7262 07/04/18 1839    .  Patient was given sequential compression devices, early ambulation, and aspirin for DVT prophylaxis.  Recent vital signs:  Patient Vitals for the past 24 hrs:  BP Temp Temp src Pulse Resp SpO2  07/06/18 0355 124/87 98.3 F (36.8 C) Oral 75 16 95 %  07/05/18 2028 126/86 98.3 F (36.8 C) Oral 76 14 100 %  07/05/18 1326 119/77 98.1 F (36.7 C) Oral 76 16 95 %  07/05/18 0817 136/84 99.1 F (37.3 C) Oral 83 16 97 %  .  Recent laboratory studies: No results found.  Discharge Medications:   Allergies as of 07/06/2018   No Known Allergies     Medication List    STOP taking these medications   traMADol 50 MG tablet Commonly known as:  ULTRAM     TAKE these medications   acetaminophen 500 MG tablet Commonly known as:  TYLENOL Take 1,000 mg by mouth every 6 (six) hours as needed for moderate pain or headache.   amiodarone 200 MG tablet Commonly known as:   PACERONE Take 1 tablet (200 mg total) by mouth 2 (two) times daily.   apixaban 5 MG Tabs tablet Commonly known as:  ELIQUIS Take 1 tablet (5 mg total) by mouth 2 (two) times daily.   atorvastatin 10 MG tablet Commonly known as:  LIPITOR Take 1 tablet (10 mg total) by mouth daily with supper.   carvedilol 25 MG tablet Commonly known as:  COREG Take 1 tablet (25 mg total) by mouth 2 (two) times daily with a meal.   freestyle lancets Use as instructed   FREESTYLE LIBRE 14 DAY READER Devi 1 Device by Does not apply route daily.   FREESTYLE LIBRE 14 DAY SENSOR Misc 1 Device by Does not apply route daily.   glucose blood test strip Commonly known as:  FREESTYLE TEST STRIPS Use as instructed   glucose monitoring kit monitoring kit 1 each by Does not apply route 4 (four) times daily - after meals and at bedtime. 1 month Diabetic Testing Supplies for QAC-QHS accuchecks.   insulin aspart 100 UNIT/ML FlexPen Commonly known as:  NOVOLOG FLEXPEN 0-15U/Subcu/TIDw/meals//CBG70-120:0U/CBG121-150:2U/CBG151-200:3U/CBG201-250:5U/CBG251-300:8U/CBG301-350:11U/CBG351-400:15U//CBG>400:Call MD What changed:    how much to take  how to take this  when to take this   insulin glargine 100 UNIT/ML injection Commonly known as:  LANTUS Inject 0.05 mLs (5 Units total) into the skin at bedtime.  Insulin Pen Needle 32G X 8 MM Misc Use as directed   Iron 325 (65 Fe) MG Tabs Take 1 tablet (325 mg total) by mouth 2 (two) times daily with a meal.   isosorbide-hydrALAZINE 20-37.5 MG tablet Commonly known as:  BIDIL Take 1 tablet by mouth 3 (three) times daily.   oxyCODONE 5 MG immediate release tablet Commonly known as:  Oxy IR/ROXICODONE Take 1-2 tablets (5-10 mg total) by mouth every 4 (four) hours as needed for up to 7 days for moderate pain or severe pain (pain score 4-6).   torsemide 20 MG tablet Commonly known as:  DEMADEX Take 3 tablets (60 mg total) by mouth daily.   Vitamin D  (Ergocalciferol) 1.25 MG (50000 UT) Caps capsule Commonly known as:  DRISDOL Take 1 capsule by mouth once weekly for 8 weeks, and then every 2 weeks. What changed:    how much to take  how to take this  when to take this  additional instructions            Durable Medical Equipment  (From admission, onward)         Start     Ordered   07/05/18 1159  For home use only DME Walker rolling  Once    Question:  Patient needs a walker to treat with the following condition  Answer:  Weakness   07/05/18 1158          Diagnostic Studies: US Renal  Result Date: 06/14/2018 CLINICAL DATA:  Acute kidney injury.  Chronic kidney disease. EXAM: RENAL / URINARY TRACT ULTRASOUND COMPLETE COMPARISON:  08/17/2016 FINDINGS: Right Kidney: Renal measurements: 11.9 by 6.2 by 6.1 cm = volume: 236 mL. Abnormal increased echogenicity. No mass or hydronephrosis visualized. Left Kidney: Renal measurements: 11.1 by 6.6 by 5.5 cm = volume: 211 mL. Abnormal increased echogenicity. No mass or hydronephrosis visualized. Bladder: Appears normal for degree of bladder distention. Other: Trace ascites along the hepatic margin. IMPRESSION: 1. Bilateral echogenic kidneys suggesting chronic medical renal disease. 2. No hydronephrosis or hydroureter. 3. Trace ascites along the hepatic margin Electronically Signed   By: Van Clines M.D.   On: 06/14/2018 09:18   Mr Foot Left Wo Contrast  Result Date: 06/10/2018 CLINICAL DATA:  Open wound medially on the left midfoot, nonhealing ulcer. Diabetes. EXAM: MRI OF THE LEFT FOOT WITHOUT CONTRAST TECHNIQUE: Multiplanar, multisequence MR imaging of the left forefoot was performed. No intravenous contrast was administered. COMPARISON:  06/09/2018 FINDINGS: Bones/Joint/Cartilage Markedly severe Lisfranc arthropathy with primarily homolateral dislocation (although primarily dorsal dislocation of the first metatarsal with respect to the medial cuneiform) common erosions, and  some early fragmentation as well as marked irregularity of the articular surfaces and subcortical marrow edema most pronounced medially. The second through fifth metatarsals are considerably further laterally displaced than the first. There may certainly be a component of Charcot arthropathy. The severe arthropathy at the first MTP joint is associated with an effusion at the articulation or pseudoarticulation, which appears to track caudad through the soft tissues all the way to the skin surface on image 36/7 and image 16/9. This would favor a draining infection. Ligaments The Lisfranc ligament is not intact. Muscles and Tendons Generalized edema in the plantar musculature of the foot. Soft tissues Soft tissue ulceration of the medial ball of the foot with a small amount of gas tracking in the regional subcutaneous soft tissues as shown on image 25/6. Overlying bandaging noted. As noted above, there is an effusion at the articulation  or pseudoarticulation between the first metatarsal and the medial cuneiform, which appears to have a draining sinus tract extending to the plantar medial foot on image 36/7. This is proximal to the dominant region of ulceration. This would imply a draining infection. IMPRESSION: 1. 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 The second through fifth metatarsals are dislocated laterally in the first metatarsal is dislocated dorsally with respect to the midfoot, with fragmentation and articular irregularity along the Lisfranc joint which has a significant chronic component. 2. There is a large erosion of the base of the first metatarsal. An effusion of the articulation/pseudoarticulation of the first metatarsal with the medial cuneiform is present and appears to drain through a sinus tract down to the medial plantar soft tissues of the foot for example on image 36/7. This would tend to  imply infection of the articulation and a draining sinus tract. 3. Ulceration along the plantar medial ball of the foot with extensive subcutaneous phlegmon and a small amount of gas in the soft tissues, compatible with cellulitis and local necrosis of the soft tissues. Electronically Signed   By: Van Clines M.D.   On: 06/10/2018 09:45   Dg Foot 2 Views Right  Result Date: 06/10/2018 CLINICAL DATA:  Diabetic ulcer right foot plantar aspect great toe. EXAM: RIGHT FOOT - 2 VIEW COMPARISON:  11/27/2017 FINDINGS: Hardware over the distal fibula intact. Mild degenerate change over the midfoot/hindfoot. Degenerative change with pencil in cup deformity of the second PIP joint. No significant air in the soft tissues over the plantar aspect of the great toe. No bone destruction to suggest osteomyelitis. IMPRESSION: No acute findings. Degenerative changes as described. Pencil in cup deformity of the second PIP joint seen with psoriatic arthritis although may be due to rheumatoid arthritis or reactive arthritis. Electronically Signed   By: Marin Olp M.D.   On: 06/10/2018 11:13   Dg Foot Complete Left  Result Date: 06/09/2018 CLINICAL DATA:  Swelling and redness EXAM: LEFT FOOT - COMPLETE 3+ VIEW COMPARISON:  08/22/2013 FINDINGS: Old fracture deformity of the proximal shaft of the fifth metatarsal. Bony destructive changes at the TMT joints with Lisfranc deformity; there is lateral displacement of the second through fifth metatarsal bases with respect to the tarsal bones. Bony resorptive changes involving the base of the first metatarsal. Pes planus deformity. Ununited fracture midportion of the second middle phalanx. Mid and distal plantar surface ulcers without soft tissue emphysema or radiopaque foreign body. IMPRESSION: 1. Deformity and bony destructive changes at the level of the TMT joints with Lisfranc deformity present. Findings could be secondary to neuropathic joint assessment for osteomyelitis  limited given the gross deformity and bony destructive changes. 2. Ununited fracture with bone resorptive changes at the second middle phalanx. 3. Diffuse soft tissue swelling. Irregularity and lucency at the mid to distal plantar surface of the foot consistent with ulcers. Electronically Signed   By: Donavan Foil M.D.   On: 06/09/2018 18:22    Patient benefited maximally from their hospital stay and there were no complications.     Disposition: Discharge disposition: 01-Home or Self Care      Discharge Instructions    Call MD / Call 911   Complete by:  As directed    If you experience chest pain or shortness of breath, CALL 911 and be transported to the hospital emergency room.  If you develope a fever above 101 F, pus (  white drainage) or increased drainage or redness at the wound, or calf pain, call your surgeon's office.   Constipation Prevention   Complete by:  As directed    Drink plenty of fluids.  Prune juice may be helpful.  You may use a stool softener, such as Colace (over the counter) 100 mg twice a day.  Use MiraLax (over the counter) for constipation as needed.   Diet - low sodium heart healthy   Complete by:  As directed    Increase activity slowly as tolerated   Complete by:  As directed      Follow-up Information    Newt Minion, MD In 1 week.   Specialty:  Orthopedic Surgery Contact information: [place] [address] [phone]            Signed: [place] [phone]

## 2018-07-06 NOTE — Care Management Note (Signed)
Case Management Note  Patient Details  Name: Joshua Massey MRN: [id number] Date of Birth: 15-Apr-1976  Subjective/Objective:                    Action/Plan: Pt discharging home with self care. PA note indicated [place] services. CM spoke to [place], [place] and she feels patient doesn't need [place] services at this time. No f/u per PT eval.  Per bedside RN pt has rolling walker for home at the bedside.  Per notes pt has Preveena Vac. Pt has assistance at home and supervision to home.    Expected Discharge Date:  07/06/18               Expected Discharge Plan:  [place]  In-House Referral:     Discharge planning Services  CM Consult  Post Acute Care Choice:    Choice offered to:  Patient  DME Arranged:  Walker rolling DME Agency:  [place]:    [place]:     Status of Service:  Completed, signed off  If discussed at [place] of Stay Meetings, dates discussed:    Additional Comments:  Pollie Friar, RN 07/06/2018, 9:18 AM

## 2018-07-06 NOTE — Progress Notes (Signed)
Subjective: 2 Days Post-Op Procedure(s) (LRB): DEBRIDEMENT OF LEFT FOOT (Left) Patient reports pain as moderate.    Objective: Vital signs in last 24 hours: Temp:  [98.1 F (36.7 C)-99.1 F (37.3 C)] 98.3 F (36.8 C) (01/17 0355) Pulse Rate:  [75-83] 75 (01/17 0355) Resp:  [14-16] 16 (01/17 0355) BP: (119-136)/(77-87) 124/87 (01/17 0355) SpO2:  [95 %-100 %] 95 % (01/17 0355)  Intake/Output from previous day: 01/16 0701 - 01/17 0700 In: 1120 [P.O.:1120] Out: 1200 [Urine:1200] Intake/Output this shift: No intake/output data recorded.  Recent Labs    07/04/18 1120  HGB 10.4*   Recent Labs    07/04/18 1120  WBC 5.6  RBC 3.69*  HCT 34.4*  PLT 239   Recent Labs    07/04/18 1120  NA 143  K 3.7  CL 113*  CO2 20*  BUN 63*  CREATININE 3.25*  GLUCOSE 140*  CALCIUM 8.3*   Recent Labs    07/04/18 1120  INR 1.53    left foot with VAC dressing intact and functioning well. ~ 100 cc of serosanguinous drainage in the VAC canister.    Assessment/Plan: 2 Days Post-Op Procedure(s) (LRB): DEBRIDEMENT OF LEFT FOOT (Left) Discharge home with home health  DC home with Prevena VAC in place and instructed to keep plugged into wall socket.  follow up in office next week.      Bolden Hagerman 07/06/2018, 7:45 AM  [place]

## 2018-07-06 NOTE — Progress Notes (Signed)
PRN gave pt discharge instructions and pt stated that he did not have any pacerone or Liptior at home if he could also have those called into the pharmacy. RN called [employer] to leave a message for [place], [place] they are on lunch will call back at 1230. PT IV has been removed pt not in any pain will transfer to [facility] when he is getting dressed shortly.

## 2018-07-10 ENCOUNTER — Inpatient Hospital Stay: Payer: Self-pay | Admitting: Family Medicine

## 2018-07-10 ENCOUNTER — Encounter (INDEPENDENT_AMBULATORY_CARE_PROVIDER_SITE_OTHER): Payer: Self-pay | Admitting: Physician Assistant

## 2018-07-10 ENCOUNTER — Ambulatory Visit (INDEPENDENT_AMBULATORY_CARE_PROVIDER_SITE_OTHER): Payer: BLUE CROSS/BLUE SHIELD | Admitting: Physician Assistant

## 2018-07-10 VITALS — Ht 71.0 in | Wt 296.0 lb

## 2018-07-10 DIAGNOSIS — N183 Chronic kidney disease, stage 3 unspecified: Secondary | ICD-10-CM

## 2018-07-10 DIAGNOSIS — I428 Other cardiomyopathies: Secondary | ICD-10-CM

## 2018-07-10 DIAGNOSIS — L97516 Non-pressure chronic ulcer of other part of right foot with bone involvement without evidence of necrosis: Secondary | ICD-10-CM

## 2018-07-10 DIAGNOSIS — Z945 Skin transplant status: Secondary | ICD-10-CM

## 2018-07-10 DIAGNOSIS — Z794 Long term (current) use of insulin: Secondary | ICD-10-CM

## 2018-07-10 DIAGNOSIS — E43 Unspecified severe protein-calorie malnutrition: Secondary | ICD-10-CM

## 2018-07-10 DIAGNOSIS — E1142 Type 2 diabetes mellitus with diabetic polyneuropathy: Secondary | ICD-10-CM

## 2018-07-10 DIAGNOSIS — E11621 Type 2 diabetes mellitus with foot ulcer: Secondary | ICD-10-CM

## 2018-07-10 DIAGNOSIS — L97426 Non-pressure chronic ulcer of left heel and midfoot with bone involvement without evidence of necrosis: Secondary | ICD-10-CM

## 2018-07-10 DIAGNOSIS — Z6841 Body Mass Index (BMI) 40.0 and over, adult: Secondary | ICD-10-CM

## 2018-07-10 NOTE — Progress Notes (Signed)
Office Visit Note   Patient: Joshua Massey           Date of Birth: 08-27-1975           MRN: [id number] Visit Date: 07/10/2018              Requested by: [name], [name], MD [address] [place], [place] [address] PCP: [name], [name], MD  Chief Complaint  Patient presents with  . Left Foot - Routine Post Op    07/04/2018 foot debridement       HPI: The patient is a 43 year old gentleman who is seen for postoperative follow-up following repeat debridement of his left foot plantar wound with placement of a split thickness skin allograft and VAC placement on 07/04/2018.  The wound VAC has been alarming and the canister has a lot of drainage and was removed today.  The patient reports minimal pain over the area.  He has been using a knee scooter for ambulation and avoiding any weightbearing through the left foot as much as possible.  He is continuing local care to the right great toe but is aware that at some point we will need to proceed with surgical intervention due to osteomyelitis of the toe on the right.  Assessment & Plan: Visit Diagnoses:  1. S/P split thickness skin graft   2. Diabetic ulcer of left midfoot associated with type 2 diabetes mellitus, with bone involvement without evidence of necrosis ([place])   3. Diabetic ulcer of toe of right foot associated with type 2 diabetes mellitus, with bone involvement without evidence of necrosis ([place])   4. Type 2 diabetes mellitus with diabetic polyneuropathy, with long-term current use of insulin ([place])   5. Non-ischemic cardiomyopathy, EF < 20%   6. CKD (chronic kidney disease), stage III ([place])   7. Severe protein-calorie malnutrition ([place])   8. BMI 40.0-44.9, adult ([place])     Plan: The VAC dressing was removed and the split thickness skin graft is intact.  He is going to apply Adaptic over the graft and then gauze or ABD pads Kerlix and Ace wrapping daily to every other day.  He is not to get the graft wet.  He is to elevate  the left foot as much as possible.  He is to continue nonweightbearing over the left foot as much as possible utilizing his knee scooter.  He will follow-up later this week and next week.  Follow-Up Instructions: Return in about 2 days (around 07/12/2018).  Ortho Exam  Patient is alert, oriented, no adenopathy, well-dressed, normal affect, normal respiratory effort. The left foot plantar surface graft does appear adherent.  There is bleeding from the islands between the mesh which was treated with silver nitrate and hemostasis was achieved.  There are no current signs of cellulitis.  He has moderate edema of the.  He has good palpable pedal pulses.  Adaptic was applied over the graft site and an ABD pad Kerlix and an Ace wrap to secure.  Imaging: Remember No results found. No images are attached to the encounter.  Labs: Lab Results  Component Value Date   HGBA1C 8.5 (H) 06/10/2018   HGBA1C 11.5 (H) 11/28/2017   HGBA1C 9.7 (H) 12/20/2016   REPTSTATUS 06/15/2018 FINAL 06/10/2018   GRAMSTAIN  06/10/2018    RARE WBC PRESENT, PREDOMINANTLY PMN FEW GRAM POSITIVE COCCI IN PAIRS RARE GRAM NEGATIVE RODS    CULT  06/10/2018    FEW MORGANELLA MORGANII MODERATE BACTEROIDES THETAIOTAOMICRON BETA LACTAMASE POSITIVE  Performed at [facility], [place] [address], [place], [place] [address]    [place] MORGANII 06/10/2018     Lab Results  Component Value Date   ALBUMIN 1.7 (L) 06/10/2018   ALBUMIN 1.9 (L) 12/07/2017   ALBUMIN 1.9 (L) 12/06/2017    Body mass index is 41.28 kg/m.  Orders:  No orders of the defined types were placed in this encounter.  No orders of the defined types were placed in this encounter.    Procedures: No procedures performed  Clinical Data: No additional findings.  ROS:  All other systems negative, except as noted in the HPI. Review of Systems  Objective: Vital Signs: Ht 5\' 11"  (1.803 m)   Wt 296 lb (134.3 kg)   BMI 41.28 kg/m    Specialty Comments:  No specialty comments available.  PMFS History: Patient Active Problem List   Diagnosis Date Noted  . Osteomyelitis of foot, left, acute ([place]) 07/04/2018  . Subacute osteomyelitis, left ankle and foot ([place])   . Left foot infection 06/21/2018  . Cellulitis of foot 06/09/2018  . Non-compliant patient 12/18/2017  . Chronic venous hypertension (idiopathic) with ulcer and inflammation of bilateral lower extremity (HCC)   . Hypovitaminosis D 11/27/2017  . Diabetic foot ulcer ([place]) 11/27/2017  . Cellulitis in diabetic foot ([place]) 11/27/2017  . Acute heart failure ([place]) 11/27/2017  . Volume overload 11/27/2017  . Hepatopathy 11/27/2017  . Hypokalemia 05/31/2017  . Diabetic peripheral neuropathy ([place]) 12/20/2016  . Hyperlipidemia associated with type 2 diabetes mellitus ([place]) 11/27/2016  . Shingles 10/11/2016  . Status post peripherally inserted central catheter (PICC) central line placement 09/12/2016  . Medication monitoring encounter 09/12/2016  . Arthritis, septic ([place])   . CKD (chronic kidney disease), stage III ([place])   . AKI (acute kidney injury) ([place])   . Peripheral edema   . Acute renal failure ([place]) 08/17/2016  . Acute systolic CHF (congestive heart failure) ([place]) 08/17/2016  . Type 2 diabetes mellitus with vascular disease ([place]) 10/21/2013  . Chronic systolic heart failure ([place]) 02/27/2012  . Coronary artery disease, 50% LAD at cath 02/16/12 02/23/2012  . Family history of coronary artery disease 02/23/2012  . Cardiogenic shock ([place]) 02/17/2012  . NSVT (nonsustained ventricular tachycardia) ([place]) 02/16/2012  . Obesity 02/16/2012  . Acute on chronic systolic CHF (congestive heart failure) ([place]) 02/16/2012  . CHF (congestive heart failure) ([place]) 02/15/2012  . Hypertension 02/15/2012  . Non-ischemic cardiomyopathy, EF < 20% 02/15/2012  . TINEA CRURIS 09/25/2009  . ALLERGIC RHINITIS CAUSE UNSPECIFIED 09/25/2009  . SCROTAL ABSCESS 09/17/2009  .  Hypertensive heart disease with heart failure ([place]) 01/26/2009   Past Medical History:  Diagnosis Date  . 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 Chronic kidney disease   . Diabetes mellitus    adult-onset, type 2  . Exogenous obesity   . GERD (gastroesophageal reflux disease)   . Hypertension     Family History  Problem Relation Age of Onset  . Coronary artery disease Brother        CABG at 62, DM  . Heart disease Brother 47       CAD  . Diabetes Mother   . Hypertension Mother   . Hyperlipidemia Mother   . Heart disease Mother   . Diabetes Father   . Hypertension Father     Past Surgical History:  Procedure Laterality Date  . ANKLE SURGERY  2003   plate and screws  . APPLICATION OF  WOUND VAC Left 06/12/2018   Procedure: APPLICATION OF WOUND VAC;  Surgeon: Mcarthur Rossetti, MD;  Location: [facility];  Service: Orthopedics;  Laterality: Left;  . CARDIAC CATHETERIZATION  02/16/2012   50% LAD lesion in mid vessel, otherwise no significant obstructive CAD  . HIP PINNING  1990   bilateral  . I&D EXTREMITY Left 06/10/2018   Procedure: IRRIGATION AND DEBRIDEMENT OF FOOT;  Surgeon: Mcarthur Rossetti, MD;  Location: [facility];  Service: Orthopedics;  Laterality: Left;  . I&D EXTREMITY Left 06/12/2018   Procedure: REPEAT IRRIGATION AND DEBRIDEMENT LEFT FOOT;  Surgeon: Mcarthur Rossetti, MD;  Location: [facility];  Service: Orthopedics;  Laterality: Left;  . I&D EXTREMITY Left 07/04/2018   Procedure: DEBRIDEMENT OF LEFT FOOT;  Surgeon: Newt Minion, MD;  Location: [facility];  Service: Orthopedics;  Laterality: Left;  . IR GENERIC HISTORICAL  09/01/2016   IR FLUORO GUIDE CV LINE RIGHT 09/01/2016 Marybelle Killings, MD [facility]  . IR GENERIC HISTORICAL  09/01/2016   IR US GUIDE VASC ACCESS RIGHT 09/01/2016 Marybelle Killings, MD [facility]  . IR GENERIC HISTORICAL  09/16/2016   IR REMOVAL TUN CV CATH W/O FL 09/16/2016 Arne Cleveland, MD [facility]  . KNEE  ARTHROSCOPY Left 08/30/2016   Procedure: ARTHROSCOPY KNEE;  Surgeon: Renette Butters, MD;  Location: [facility];  Service: Orthopedics;  Laterality: Left;  . LEFT AND RIGHT HEART CATHETERIZATION WITH CORONARY ANGIOGRAM N/A 02/16/2012   Procedure: LEFT AND RIGHT HEART CATHETERIZATION WITH CORONARY ANGIOGRAM;  Surgeon: Pixie Casino, MD;  Location: [facility] CATH LAB;  Service: Cardiovascular;  Laterality: N/A;  . TRANSTHORACIC ECHOCARDIOGRAM  08/31/2012   EF 35-40%, no significant wall motion abnormalities, diastolic relaxation abnormality - no longer needed LifeVest; LV cavity size mod dilated with mod conc hypertrophy, systolic function mod reduced; mild MR, LA mod dilated   Social History   Occupational History  . Occupation: Brewing technologist    Comment: [name]  . Occupation: newpaper delivery    Employer: [employer]  Tobacco Use  . Smoking status: Never Smoker  . Smokeless tobacco: Never Used  Substance and Sexual Activity  . Alcohol use: No  . Drug use: No  . Sexual activity: Yes    Birth control/protection: Condom

## 2018-07-11 ENCOUNTER — Inpatient Hospital Stay: Payer: BLUE CROSS/BLUE SHIELD | Admitting: Family Medicine

## 2018-07-12 ENCOUNTER — Ambulatory Visit (INDEPENDENT_AMBULATORY_CARE_PROVIDER_SITE_OTHER): Payer: BLUE CROSS/BLUE SHIELD | Admitting: Orthopedic Surgery

## 2018-07-12 ENCOUNTER — Encounter (INDEPENDENT_AMBULATORY_CARE_PROVIDER_SITE_OTHER): Payer: Self-pay | Admitting: Orthopedic Surgery

## 2018-07-12 VITALS — Ht 71.0 in | Wt 296.0 lb

## 2018-07-12 DIAGNOSIS — Z945 Skin transplant status: Secondary | ICD-10-CM

## 2018-07-19 ENCOUNTER — Telehealth: Payer: Self-pay | Admitting: Family Medicine

## 2018-07-19 MED ORDER — TORSEMIDE 20 MG PO TABS: 60.0000 mg | ORAL_TABLET | Freq: Every day | ORAL | 0 refills | Status: DC

## 2018-07-19 NOTE — Telephone Encounter (Signed)
Copied from [place] [phone]. Topic: Quick Communication - Rx Refill/Question >> Jul 19, 2018  2:52 PM Ahmed Prima L wrote: Medication: torsemide (DEMADEX) 20 MG tablet  Has the patient contacted their pharmacy? Yes (Agent: If no, request that the patient contact the pharmacy for the refill.) (Agent: If yes, when and what did the pharmacy advise?)  Preferred Pharmacy (with phone number or street name): [place], [place] - [address] [place] [address]    Agent: Please be advised that RX refills may take up to 3 business days. We ask that you follow-up with your pharmacy.

## 2018-07-19 NOTE — Telephone Encounter (Signed)
30 day courtesy refill provided. Next OV in February.

## 2018-07-23 ENCOUNTER — Inpatient Hospital Stay: Payer: BLUE CROSS/BLUE SHIELD | Admitting: Family Medicine

## 2018-07-23 ENCOUNTER — Encounter (INDEPENDENT_AMBULATORY_CARE_PROVIDER_SITE_OTHER): Payer: Self-pay | Admitting: Orthopedic Surgery

## 2018-07-23 NOTE — Progress Notes (Signed)
Office Visit Note   Patient: Joshua Massey           Date of Birth: 31-Mar-1976           MRN: [id number] Visit Date: 07/12/2018              Requested by: [name], [name], MD [address] [place], [place] [address] PCP: [name], [name], MD  Chief Complaint  Patient presents with  . Left Foot - Routine Post Op    07/04/2018 left foot debridement       HPI: Patient is a 43 year old gentleman is status post split-thickness skin graft for a large wound with diabetic insensate neuropathy.  Patient is currently in a postoperative shoe nonweightbearing and using a scooter.  Assessment & Plan: Visit Diagnoses:  1. S/P split thickness skin graft     Plan: We will have him start dressing changes with Dial soap cleansing dry dressing changes daily continue nonweightbearing.  Follow-Up Instructions: Return in about 2 weeks (around 07/26/2018).   Ortho Exam  Patient is alert, oriented, no adenopathy, well-dressed, normal affect, normal respiratory effort. Examination the wound VAC is removed there is good healthy granulation tissue and rolling into the split-thickness skin graft.  No complicating features no cellulitis no abscess no drainage no odor.  Imaging: No results found. No images are attached to the encounter.  Labs: Lab Results  Component Value Date   HGBA1C 8.5 (H) 06/10/2018   HGBA1C 11.5 (H) 11/28/2017   HGBA1C 9.7 (H) 12/20/2016   REPTSTATUS 06/15/2018 FINAL 06/10/2018   GRAMSTAIN  06/10/2018    RARE WBC PRESENT, PREDOMINANTLY PMN FEW GRAM POSITIVE COCCI IN PAIRS RARE GRAM NEGATIVE RODS    CULT  06/10/2018    FEW MORGANELLA MORGANII MODERATE BACTEROIDES THETAIOTAOMICRON BETA LACTAMASE POSITIVE Performed at [facility], [place] [address], [place], [place] [address]    [place] MORGANII 06/10/2018     Lab Results  Component Value Date   ALBUMIN 1.7 (L) 06/10/2018   ALBUMIN 1.9 (L) 12/07/2017   ALBUMIN 1.9 (L) 12/06/2017    Body  mass index is 41.28 kg/m.  Orders:  No orders of the defined types were placed in this encounter.  No orders of the defined types were placed in this encounter.    Procedures: No procedures performed  Clinical Data: No additional findings.  ROS:  All other systems negative, except as noted in the HPI. Review of Systems  Objective: Vital Signs: Ht 5\' 11"  (1.803 m)   Wt 296 lb (134.3 kg)   BMI 41.28 kg/m   Specialty Comments:  No specialty comments available.  PMFS History: Patient Active Problem List   Diagnosis Date Noted  . Osteomyelitis of foot, left, acute ([place]) 07/04/2018  . Subacute osteomyelitis, left ankle and foot ([place])   . Left foot infection 06/21/2018  . Cellulitis of foot 06/09/2018  . Non-compliant patient 12/18/2017  . Chronic venous hypertension (idiopathic) with ulcer and inflammation of bilateral lower extremity (HCC)   . Hypovitaminosis D 11/27/2017  . Diabetic foot ulcer ([place]) 11/27/2017  . Cellulitis in diabetic foot ([place]) 11/27/2017  . Acute heart failure ([place]) 11/27/2017  . Volume overload 11/27/2017  . Hepatopathy 11/27/2017  . Hypokalemia 05/31/2017  . Diabetic peripheral neuropathy ([place]) 12/20/2016  . Hyperlipidemia associated with type 2 diabetes mellitus ([place]) 11/27/2016  . Shingles 10/11/2016  . Status post peripherally inserted central catheter (PICC) central line placement 09/12/2016  . Medication monitoring encounter 09/12/2016  . Arthritis,  septic ([place])   . CKD (chronic kidney disease), stage III ([place])   . AKI (acute kidney injury) ([place])   . Peripheral edema   . Acute renal failure ([place]) 08/17/2016  . Acute systolic CHF (congestive heart failure) ([place]) 08/17/2016  . Type 2 diabetes mellitus with vascular disease ([place]) 10/21/2013  . Chronic systolic heart failure ([place]) 02/27/2012  . Coronary artery disease, 50% LAD at cath 02/16/12 02/23/2012  . Family history of coronary artery disease 02/23/2012  . Cardiogenic shock ([place])  02/17/2012  . NSVT (nonsustained ventricular tachycardia) ([place]) 02/16/2012  . Obesity 02/16/2012  . Acute on chronic systolic CHF (congestive heart failure) ([place]) 02/16/2012  . CHF (congestive heart failure) ([place]) 02/15/2012  . Hypertension 02/15/2012  . Non-ischemic cardiomyopathy, EF < 20% 02/15/2012  . TINEA CRURIS 09/25/2009  . ALLERGIC RHINITIS CAUSE UNSPECIFIED 09/25/2009  . SCROTAL ABSCESS 09/17/2009  . Hypertensive heart disease with heart failure ([place]) 01/26/2009   Past Medical History:  Diagnosis Date  . 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 Chronic kidney disease   . Diabetes mellitus    adult-onset, type 2  . Exogenous obesity   . GERD (gastroesophageal reflux disease)   . Hypertension     Family History  Problem Relation Age of Onset  . Coronary artery disease Brother        CABG at 57, DM  . Heart disease Brother 21       CAD  . Diabetes Mother   . Hypertension Mother   . Hyperlipidemia Mother   . Heart disease Mother   . Diabetes Father   . Hypertension Father     Past Surgical History:  Procedure Laterality Date  . ANKLE SURGERY  2003   plate and screws  . APPLICATION OF WOUND VAC Left 06/12/2018   Procedure: APPLICATION OF WOUND VAC;  Surgeon: Mcarthur Rossetti, MD;  Location: [facility];  Service: Orthopedics;  Laterality: Left;  . CARDIAC CATHETERIZATION  02/16/2012   50% LAD lesion in mid vessel, otherwise no significant obstructive CAD  . HIP PINNING  1990   bilateral  . I&D EXTREMITY Left 06/10/2018   Procedure: IRRIGATION AND DEBRIDEMENT OF FOOT;  Surgeon: Mcarthur Rossetti, MD;  Location: [facility];  Service: Orthopedics;  Laterality: Left;  . I&D EXTREMITY Left 06/12/2018   Procedure: REPEAT IRRIGATION AND DEBRIDEMENT LEFT FOOT;  Surgeon: Mcarthur Rossetti, MD;  Location: [facility];  Service: Orthopedics;  Laterality: Left;  . I&D EXTREMITY Left 07/04/2018   Procedure: DEBRIDEMENT OF LEFT FOOT;  Surgeon: Newt Minion, MD;  Location: [facility];  Service: Orthopedics;  Laterality: Left;  . IR GENERIC HISTORICAL  09/01/2016   IR FLUORO GUIDE CV LINE RIGHT 09/01/2016 Marybelle Killings, MD [facility]  . IR GENERIC HISTORICAL  09/01/2016   IR US GUIDE VASC ACCESS RIGHT 09/01/2016 Marybelle Killings, MD [facility]  . IR GENERIC HISTORICAL  09/16/2016   IR REMOVAL TUN CV CATH W/O FL 09/16/2016 Arne Cleveland, MD [facility]  . KNEE ARTHROSCOPY Left 08/30/2016   Procedure: ARTHROSCOPY KNEE;  Surgeon: Renette Butters, MD;  Location: [facility];  Service: Orthopedics;  Laterality: Left;  . LEFT AND RIGHT HEART CATHETERIZATION WITH CORONARY ANGIOGRAM N/A 02/16/2012   Procedure: LEFT AND RIGHT HEART CATHETERIZATION WITH CORONARY ANGIOGRAM;  Surgeon: Pixie Casino, MD;  Location: [facility];  Service: Cardiovascular;  Laterality: N/A;  . TRANSTHORACIC ECHOCARDIOGRAM  08/31/2012   EF 35-40%, no significant wall motion  abnormalities, diastolic relaxation abnormality - no longer needed LifeVest; LV cavity size mod dilated with mod conc hypertrophy, systolic function mod reduced; mild MR, LA mod dilated   Social History   Occupational History  . Occupation: Brewing technologist    Comment: [name]  . Occupation: newpaper delivery    Employer: [employer]  Tobacco Use  . Smoking status: Never Smoker  . Smokeless tobacco: Never Used  Substance and Sexual Activity  . Alcohol use: No  . Drug use: No  . Sexual activity: Yes    Birth control/protection: Condom

## 2018-07-27 ENCOUNTER — Other Ambulatory Visit: Payer: Self-pay | Admitting: Family Medicine

## 2018-07-27 ENCOUNTER — Inpatient Hospital Stay: Payer: BLUE CROSS/BLUE SHIELD | Admitting: Family Medicine

## 2018-07-27 MED ORDER — CARVEDILOL 25 MG PO TABS: 25.0000 mg | ORAL_TABLET | Freq: Two times a day (BID) | ORAL | 0 refills | Status: DC

## 2018-07-27 MED ORDER — AMIODARONE HCL 200 MG PO TABS: 200.0000 mg | ORAL_TABLET | Freq: Two times a day (BID) | ORAL | 0 refills | Status: DC

## 2018-07-27 MED ORDER — TORSEMIDE 20 MG PO TABS: 60.0000 mg | ORAL_TABLET | Freq: Every day | ORAL | 0 refills | Status: DC

## 2018-07-27 MED ORDER — ISOSORB DINITRATE-HYDRALAZINE 20-37.5 MG PO TABS: 1.0000 | ORAL_TABLET | Freq: Three times a day (TID) | ORAL | 0 refills | Status: DC

## 2018-07-27 MED ORDER — APIXABAN 5 MG PO TABS: 5.0000 mg | ORAL_TABLET | Freq: Two times a day (BID) | ORAL | 0 refills | Status: DC

## 2018-07-27 NOTE — Telephone Encounter (Signed)
Message sent to Dr. Jordan for review and approval. 

## 2018-07-27 NOTE — Telephone Encounter (Signed)
Rx sent as requested.

## 2018-07-27 NOTE — Telephone Encounter (Signed)
Patient called to cancel HFU because schools are closed and no childcare.  He rescheduled for 07/31/18 and needs additional pills until appointment.

## 2018-07-31 ENCOUNTER — Inpatient Hospital Stay: Payer: BLUE CROSS/BLUE SHIELD | Admitting: Family Medicine

## 2018-08-01 ENCOUNTER — Other Ambulatory Visit: Payer: Self-pay | Admitting: Family Medicine

## 2018-08-01 ENCOUNTER — Encounter (INDEPENDENT_AMBULATORY_CARE_PROVIDER_SITE_OTHER): Payer: Self-pay | Admitting: Family

## 2018-08-01 ENCOUNTER — Ambulatory Visit (INDEPENDENT_AMBULATORY_CARE_PROVIDER_SITE_OTHER): Payer: BLUE CROSS/BLUE SHIELD | Admitting: Family

## 2018-08-01 ENCOUNTER — Ambulatory Visit (INDEPENDENT_AMBULATORY_CARE_PROVIDER_SITE_OTHER): Payer: BLUE CROSS/BLUE SHIELD | Admitting: Orthopedic Surgery

## 2018-08-01 DIAGNOSIS — Z945 Skin transplant status: Secondary | ICD-10-CM

## 2018-08-02 ENCOUNTER — Encounter (INDEPENDENT_AMBULATORY_CARE_PROVIDER_SITE_OTHER): Payer: Self-pay | Admitting: Family

## 2018-08-02 DIAGNOSIS — Z945 Skin transplant status: Secondary | ICD-10-CM | POA: Insufficient documentation

## 2018-08-02 NOTE — Progress Notes (Signed)
Office Visit Note   Patient: Joshua Massey           Date of Birth: 08-01-1975           MRN: [id number] Visit Date: 08/01/2018              Requested by: [name], [name], MD [address] [place], [place] [address] PCP: [name], [name], MD  Chief Complaint  Patient presents with  . Left Foot - Routine Post Op    Deb 07/04/2018 left foot      HPI: Patient is a 43 year old gentleman is status post split-thickness skin graft for a large wound with diabetic insensate neuropathy.  Patient is currently in a postoperative shoe nonweightbearing and using a scooter.  Sutures remain in place.  Patient and accompanying family report saturating dressings with drainage.  Assessment & Plan: Visit Diagnoses:  No diagnosis found.  Plan: We will have continue daily dressing changes with silver cell which was provided, a 2-week supply to be applied following Dial soap cleansing. continue nonweightbearing.  The importance of elevation and nonweightbearing were emphasized.  Discussed return precautions.  Follow-Up Instructions: No follow-ups on file.   Ortho Exam  Patient is alert, oriented, no adenopathy, well-dressed, normal affect, normal respiratory effort. Examination the plantar wound sutures do remain in place however there is moderate edema. sutures to be harvested today as these are loose.  There is a central area of dehiscence and torn suture material this is 2 cm in diameter and 9 mm deep.  There is granulation tissue in the wound bed there is scant serous drainage on exam.  No erythema no warmth no odor. See attached image for photo of wound from today's appointment. Imaging: No results found. No images are attached to the encounter.  Labs: Lab Results  Component Value Date   HGBA1C 8.5 (H) 06/10/2018   HGBA1C 11.5 (H) 11/28/2017   HGBA1C 9.7 (H) 12/20/2016   REPTSTATUS 06/15/2018 FINAL 06/10/2018   GRAMSTAIN  06/10/2018    RARE WBC PRESENT, PREDOMINANTLY PMN FEW  GRAM POSITIVE COCCI IN PAIRS RARE GRAM NEGATIVE RODS    CULT  06/10/2018    FEW MORGANELLA MORGANII MODERATE BACTEROIDES THETAIOTAOMICRON BETA LACTAMASE POSITIVE Performed at [facility], [place] [address] [place], [place] [address]    [place] MORGANII 06/10/2018     Lab Results  Component Value Date   ALBUMIN 1.7 (L) 06/10/2018   ALBUMIN 1.9 (L) 12/07/2017   ALBUMIN 1.9 (L) 12/06/2017    Body mass index is 41.28 kg/m.  Orders:  No orders of the defined types were placed in this encounter.  No orders of the defined types were placed in this encounter.    Procedures: No procedures performed  Clinical Data: No additional findings.  ROS:  All other systems negative, except as noted in the HPI. Review of Systems  Constitutional: Negative for chills and fever.    Objective: Vital Signs: Ht 5\' 11"  (1.803 m)   Wt 296 lb (134.3 kg)   BMI 41.28 kg/m   Specialty Comments:  No specialty comments available.  PMFS History: Patient Active Problem List   Diagnosis Date Noted  . Osteomyelitis of foot, left, acute ([place]) 07/04/2018  . Subacute osteomyelitis, left ankle and foot ([place])   . Left foot infection 06/21/2018  . Cellulitis of foot 06/09/2018  . Non-compliant patient 12/18/2017  . Chronic venous hypertension (idiopathic) with ulcer and inflammation of bilateral lower extremity (HCC)   . Hypovitaminosis  D 11/27/2017  . Diabetic foot ulcer ([place]) 11/27/2017  . Cellulitis in diabetic foot ([place]) 11/27/2017  . Acute heart failure ([place]) 11/27/2017  . Volume overload 11/27/2017  . Hepatopathy 11/27/2017  . Hypokalemia 05/31/2017  . Diabetic peripheral neuropathy ([place]) 12/20/2016  . Hyperlipidemia associated with type 2 diabetes mellitus ([place]) 11/27/2016  . Shingles 10/11/2016  . Status post peripherally inserted central catheter (PICC) central line placement 09/12/2016  . Medication monitoring encounter 09/12/2016  . Arthritis, septic ([place])   .  CKD (chronic kidney disease), stage III ([place])   . AKI (acute kidney injury) ([place])   . Peripheral edema   . Acute renal failure ([place]) 08/17/2016  . Acute systolic CHF (congestive heart failure) ([place]) 08/17/2016  . Type 2 diabetes mellitus with vascular disease ([place]) 10/21/2013  . Chronic systolic heart failure ([place]) 02/27/2012  . Coronary artery disease, 50% LAD at cath 02/16/12 02/23/2012  . Family history of coronary artery disease 02/23/2012  . Cardiogenic shock ([place]) 02/17/2012  . NSVT (nonsustained ventricular tachycardia) ([place]) 02/16/2012  . Obesity 02/16/2012  . Acute on chronic systolic CHF (congestive heart failure) ([place]) 02/16/2012  . CHF (congestive heart failure) ([place]) 02/15/2012  . Hypertension 02/15/2012  . Non-ischemic cardiomyopathy, EF < 20% 02/15/2012  . TINEA CRURIS 09/25/2009  . ALLERGIC RHINITIS CAUSE UNSPECIFIED 09/25/2009  . SCROTAL ABSCESS 09/17/2009  . Hypertensive heart disease with heart failure ([place]) 01/26/2009   Past Medical History:  Diagnosis Date  . 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 Chronic kidney disease   . Diabetes mellitus    adult-onset, type 2  . Exogenous obesity   . GERD (gastroesophageal reflux disease)   . Hypertension     Family History  Problem Relation Age of Onset  . Coronary artery disease Brother        CABG at 34, DM  . Heart disease Brother 1       CAD  . Diabetes Mother   . Hypertension Mother   . Hyperlipidemia Mother   . Heart disease Mother   . Diabetes Father   . Hypertension Father     Past Surgical History:  Procedure Laterality Date  . ANKLE SURGERY  2003   plate and screws  . APPLICATION OF WOUND VAC Left 06/12/2018   Procedure: APPLICATION OF WOUND VAC;  Surgeon: Mcarthur Rossetti, MD;  Location: [facility];  Service: Orthopedics;  Laterality: Left;  . CARDIAC CATHETERIZATION  02/16/2012   50% LAD lesion in mid vessel, otherwise no significant obstructive CAD  . HIP PINNING   1990   bilateral  . I&D EXTREMITY Left 06/10/2018   Procedure: IRRIGATION AND DEBRIDEMENT OF FOOT;  Surgeon: Mcarthur Rossetti, MD;  Location: [facility];  Service: Orthopedics;  Laterality: Left;  . I&D EXTREMITY Left 06/12/2018   Procedure: REPEAT IRRIGATION AND DEBRIDEMENT LEFT FOOT;  Surgeon: Mcarthur Rossetti, MD;  Location: [facility];  Service: Orthopedics;  Laterality: Left;  . I&D EXTREMITY Left 07/04/2018   Procedure: DEBRIDEMENT OF LEFT FOOT;  Surgeon: Newt Minion, MD;  Location: [facility];  Service: Orthopedics;  Laterality: Left;  . IR GENERIC HISTORICAL  09/01/2016   IR FLUORO GUIDE CV LINE RIGHT 09/01/2016 Marybelle Killings, MD [facility]  . IR GENERIC HISTORICAL  09/01/2016   IR US GUIDE VASC ACCESS RIGHT 09/01/2016 Marybelle Killings, MD [facility]  . IR GENERIC HISTORICAL  09/16/2016   IR REMOVAL TUN CV CATH W/O FL 09/16/2016 Arne Cleveland, MD [facility]  .  KNEE ARTHROSCOPY Left 08/30/2016   Procedure: ARTHROSCOPY KNEE;  Surgeon: Renette Butters, MD;  Location: [facility];  Service: Orthopedics;  Laterality: Left;  . LEFT AND RIGHT HEART CATHETERIZATION WITH CORONARY ANGIOGRAM N/A 02/16/2012   Procedure: LEFT AND RIGHT HEART CATHETERIZATION WITH CORONARY ANGIOGRAM;  Surgeon: Pixie Casino, MD;  Location: [facility];  Service: Cardiovascular;  Laterality: N/A;  . TRANSTHORACIC ECHOCARDIOGRAM  08/31/2012   EF 35-40%, no significant wall motion abnormalities, diastolic relaxation abnormality - no longer needed LifeVest; LV cavity size mod dilated with mod conc hypertrophy, systolic function mod reduced; mild MR, LA mod dilated   Social History   Occupational History  . Occupation: Brewing technologist    Comment: [name]  . Occupation: newpaper delivery    Employer: [employer]  Tobacco Use  . Smoking status: Never Smoker  . Smokeless tobacco: Never Used  Substance and Sexual Activity  . Alcohol use: No  . Drug use: No  . Sexual activity: Yes    Birth  control/protection: Condom

## 2018-08-03 ENCOUNTER — Other Ambulatory Visit (INDEPENDENT_AMBULATORY_CARE_PROVIDER_SITE_OTHER): Payer: Self-pay | Admitting: Physician Assistant

## 2018-08-03 ENCOUNTER — Inpatient Hospital Stay: Payer: BLUE CROSS/BLUE SHIELD | Admitting: Family Medicine

## 2018-08-03 ENCOUNTER — Telehealth: Payer: Self-pay | Admitting: Family Medicine

## 2018-08-03 ENCOUNTER — Telehealth (INDEPENDENT_AMBULATORY_CARE_PROVIDER_SITE_OTHER): Payer: Self-pay | Admitting: Physician Assistant

## 2018-08-03 DIAGNOSIS — E11621 Type 2 diabetes mellitus with foot ulcer: Secondary | ICD-10-CM

## 2018-08-03 DIAGNOSIS — L97426 Non-pressure chronic ulcer of left heel and midfoot with bone involvement without evidence of necrosis: Principal | ICD-10-CM

## 2018-08-03 MED ORDER — OXYCODONE-ACETAMINOPHEN 5-325 MG PO TABS: 1.0000 | ORAL_TABLET | Freq: Four times a day (QID) | ORAL | 0 refills | Status: DC | PRN

## 2018-08-03 NOTE — Telephone Encounter (Signed)
Please advise  Pt last Oxy 5 mg prescribed on 07/06/2018 Qty #30 for 5 days. Thank you

## 2018-08-03 NOTE — Telephone Encounter (Signed)
Spoke with patient and he scheduled for Tuesday, 08/07/2018. Patient aware that if he no show, he will be dismissed and aware that no refills will be providing until he come in.

## 2018-08-03 NOTE — Telephone Encounter (Signed)
Called and notified patient that he has multiple "No Shows" with Dr. [name]. That if he No Showed again, he would be dismissed. Patient verbalized understanding.

## 2018-08-03 NOTE — Telephone Encounter (Signed)
Called patient and informed him Rx was ready for pickup.

## 2018-08-03 NOTE — Telephone Encounter (Signed)
Will refill this for 1 last refill following his surgery.

## 2018-08-03 NOTE — Telephone Encounter (Signed)
Patient called asked if the Rx for (Oxycodone) can be called into his pharmacy. Patient uses the Paediatric nurse at [facility]. The number to contact patient is [phone]

## 2018-08-07 ENCOUNTER — Ambulatory Visit (INDEPENDENT_AMBULATORY_CARE_PROVIDER_SITE_OTHER): Payer: BLUE CROSS/BLUE SHIELD | Admitting: Family Medicine

## 2018-08-07 ENCOUNTER — Encounter: Payer: Self-pay | Admitting: Family Medicine

## 2018-08-07 VITALS — BP 124/83 | HR 86 | Temp 98.2°F | Resp 16 | Ht 71.0 in | Wt 333.2 lb

## 2018-08-07 DIAGNOSIS — E1169 Type 2 diabetes mellitus with other specified complication: Secondary | ICD-10-CM | POA: Diagnosis not present

## 2018-08-07 DIAGNOSIS — N184 Chronic kidney disease, stage 4 (severe): Secondary | ICD-10-CM

## 2018-08-07 DIAGNOSIS — E785 Hyperlipidemia, unspecified: Secondary | ICD-10-CM

## 2018-08-07 DIAGNOSIS — E1159 Type 2 diabetes mellitus with other circulatory complications: Secondary | ICD-10-CM

## 2018-08-07 DIAGNOSIS — E559 Vitamin D deficiency, unspecified: Secondary | ICD-10-CM

## 2018-08-07 DIAGNOSIS — E876 Hypokalemia: Secondary | ICD-10-CM

## 2018-08-07 DIAGNOSIS — I110 Hypertensive heart disease with heart failure: Secondary | ICD-10-CM

## 2018-08-07 LAB — BASIC METABOLIC PANEL
BUN: 58 mg/dL — ABNORMAL HIGH (ref 6–23)
CO2: 26 meq/L (ref 19–32)
Calcium: 7.9 mg/dL — ABNORMAL LOW (ref 8.4–10.5)
Chloride: 108 mEq/L (ref 96–112)
Creatinine, Ser: 3.25 mg/dL — ABNORMAL HIGH (ref 0.40–1.50)
GFR: 25.5 mL/min — ABNORMAL LOW (ref >60.00)
Glucose, Bld: 130 mg/dL — ABNORMAL HIGH (ref 70–99)
Potassium: 4 mEq/L (ref 3.5–5.1)
Sodium: 144 mEq/L (ref 135–145)

## 2018-08-07 LAB — VITAMIN D 25 HYDROXY (VIT D DEFICIENCY, FRACTURES): VITD: 19.74 ng/mL — ABNORMAL LOW (ref 30.00–100.00)

## 2018-08-07 LAB — MICROALBUMIN / CREATININE URINE RATIO
CREATININE, U: 55.1 mg/dL
Microalb Creat Ratio: 470 mg/g — ABNORMAL HIGH (ref 0.0–30.0)
Microalb, Ur: 259.1 mg/dL — ABNORMAL HIGH (ref 0.0–1.9)

## 2018-08-07 LAB — TSH: TSH: 5.57 u[IU]/mL — ABNORMAL HIGH (ref 0.35–4.50)

## 2018-08-07 MED ORDER — AMIODARONE HCL 200 MG PO TABS: 200.0000 mg | ORAL_TABLET | Freq: Two times a day (BID) | ORAL | 0 refills | Status: DC

## 2018-08-07 MED ORDER — ISOSORB DINITRATE-HYDRALAZINE 20-37.5 MG PO TABS: 1.0000 | ORAL_TABLET | Freq: Three times a day (TID) | ORAL | 1 refills | Status: DC

## 2018-08-07 MED ORDER — VITAMIN D (ERGOCALCIFEROL) 1.25 MG (50000 UNIT) PO CAPS: ORAL_CAPSULE | ORAL | 1 refills | Status: DC

## 2018-08-07 MED ORDER — INSULIN GLARGINE 100 UNIT/ML ~~LOC~~ SOLN: 5.0000 [IU] | Freq: Every day | SUBCUTANEOUS | 1 refills | Status: DC

## 2018-08-07 MED ORDER — ATORVASTATIN CALCIUM 10 MG PO TABS: 10.0000 mg | ORAL_TABLET | Freq: Every day | ORAL | 3 refills | Status: AC

## 2018-08-07 MED ORDER — CARVEDILOL 25 MG PO TABS: 25.0000 mg | ORAL_TABLET | Freq: Two times a day (BID) | ORAL | 1 refills | Status: DC

## 2018-08-07 MED ORDER — TORSEMIDE 20 MG PO TABS: 60.0000 mg | ORAL_TABLET | Freq: Every day | ORAL | 0 refills | Status: DC

## 2018-08-07 MED ORDER — APIXABAN 5 MG PO TABS: 5.0000 mg | ORAL_TABLET | Freq: Two times a day (BID) | ORAL | 1 refills | Status: DC

## 2018-08-07 NOTE — Assessment & Plan Note (Addendum)
Adequately controlled. No changes in current management. Low-salt diet to continue. Instructed to arrange an eye exam, he is overdue. Keep next appointment with cardiologist.

## 2018-08-07 NOTE — Assessment & Plan Note (Signed)
Currently he is not on K+ supplementation. Further recommendation will be given according to BMP results.

## 2018-08-07 NOTE — Assessment & Plan Note (Addendum)
Currently he is not on vitamin D supplementation. Recommend resuming ergocalciferol 50,000 units every 2 weeks. Further recommendation will be given according to 25 OH vitamin D results.

## 2018-08-07 NOTE — Progress Notes (Signed)
HPI:   Joshua Massey is a 43 y.o. male, who is here today for chronic disease management.  He was last seen on 12/18/2017. Multiple no-shows.  Since he is last OV he has undergone left foot surgery due to left foot plantar ulcer and osteomyelitis of the medial cuneiform, she has had 3 surgical procedures.  Following with Dr. Sharol Given  DM 2: Currently he is on Lantus 5 units at night and NovoLog sliding scale, usually 5 to 6 units daily. BS between 130's-150's.  He denies hypoglycemic events. He is trying to do better with his diet. Denies abdominal pain, nausea,vomiting, polydipsia,polyuria, or polyphagia.  He has not had an eye exam in over a year.   Lab Results  Component Value Date   HGBA1C 8.5 (H) 06/10/2018   Lab Results  Component Value Date   MICROALBUR 73.3 12/20/2016    Hypertension and CHF: Currently he is on BiDil 20-37.5 mg 3 times per day, carvedilol 25 mg twice daily. He is also on amiodarone 200 mg twice daily and torsemide 60 mg daily.Marland Kitchen  He is reporting bilateral lower extremity edema, which has been stable since discharged from the hospital. He denies chest pain, palpitations, dyspnea, orthopnea, or PND. He is now awaiting himself at home because difficulty on standing due to left foot surgery.  Following with cardiologist, next appointment with Dr. Myrle Sheng, 08/2018.  He is supposed to have an echo done before visit.  Last echo on 11/28/2017: LVEF 15 to 20%, diffuse hypokinesis, and grade 2 diastolic dysfunction. He has appointment for echo in 08/2018.  CKD 3-4: He has not noted gross hematuria, decreased urine output, or foamy urine. He is not on NSAIDs. He is following low-salt diet.  Since 11/2017 GFR has been in the 14-17. Creatinine also worse since 11/2017, 4.5-4.8.  Hypokalemia, he is not on potassium.   Lab Results  Component Value Date   CREATININE 3.25 (H) 07/04/2018   BUN 63 (H) 07/04/2018   NA 143 07/04/2018   K 3.7 07/04/2018     CL 113 (H) 07/04/2018   CO2 20 (L) 07/04/2018   Hyperlipidemia: He ran out of atorvastatin 10 mg. He does not recall side effects.  Lab Results  Component Value Date   CHOL 135 12/20/2016   HDL 41 12/20/2016   LDLCALC 76 12/20/2016   TRIG 90 12/20/2016   CHOLHDL 3.3 12/20/2016   Lab Results  Component Value Date   ALT 28 06/10/2018   AST 16 06/10/2018   ALKPHOS 211 (H) 06/10/2018   BILITOT 1.3 (H) 06/10/2018    Vitamin D deficiency, he ran out of ergocalciferol 50,000 units a couple weeks ago. Last 25 OH vitamin D in 12/2016, 8.  Review of Systems  Constitutional: Positive for fatigue. Negative for activity change, appetite change, fever and unexpected weight change.  HENT: Negative for nosebleeds, sore throat and trouble swallowing.   Eyes: Negative for redness and visual disturbance.  Respiratory: Negative for cough, shortness of breath and wheezing.   Cardiovascular: Positive for leg swelling. Negative for chest pain and palpitations.  Gastrointestinal: Negative for abdominal pain, nausea and vomiting.  Endocrine: Negative for cold intolerance, heat intolerance, polydipsia, polyphagia and polyuria.  Genitourinary: Negative for decreased urine volume, dysuria and hematuria.  Musculoskeletal: Positive for arthralgias. Negative for myalgias.  Skin: Positive for wound. Negative for rash.  Neurological: Negative for syncope, weakness and headaches.     Current Outpatient Medications on File Prior to Visit  Medication Sig  Dispense Refill  . acetaminophen (TYLENOL) 500 MG tablet Take 1,000 mg by mouth every 6 (six) hours as needed for moderate pain or headache.    . Continuous Blood Gluc Receiver (FREESTYLE LIBRE 14 DAY READER) DEVI 1 Device by Does not apply route daily. 2 Device 5  . Continuous Blood Gluc Sensor (FREESTYLE LIBRE 14 DAY SENSOR) MISC 1 Device by Does not apply route daily. 2 each 5  . Ferrous Sulfate (IRON) 325 (65 Fe) MG TABS Take 1 tablet (325 mg total)  by mouth 2 (two) times daily with a meal. 60 tablet 0  . glucose blood (FREESTYLE TEST STRIPS) test strip Use as instructed 100 each 0  . glucose monitoring kit (FREESTYLE) monitoring kit 1 each by Does not apply route 4 (four) times daily - after meals and at bedtime. 1 month Diabetic Testing Supplies for QAC-QHS accuchecks. 1 each 1  . insulin aspart (NOVOLOG FLEXPEN) 100 UNIT/ML FlexPen 0-15U/Subcu/TIDw/meals//CBG70-120:0U/CBG121-150:2U/CBG151-200:3U/CBG201-250:5U/CBG251-300:8U/CBG301-350:11U/CBG351-400:15U//CBG>400:Call MD (Patient taking differently: Inject 0-15 Units into the skin 3 (three) times daily with meals. 0-15U/Subcu/TIDw/meals//CBG70-120:0U/CBG121-150:2U/CBG151-200:3U/CBG201-250:5U/CBG251-300:8U/CBG301-350:11U/CBG351-400:15U//CBG>400:Call MD) 15 mL 0  . Insulin Pen Needle 32G X 8 MM MISC Use as directed 100 each 0  . Lancets (FREESTYLE) lancets Use as instructed 100 each 0  . oxyCODONE-acetaminophen (PERCOCET/ROXICET) 5-325 MG tablet Take 1 tablet by mouth every 6 (six) hours as needed for moderate pain or severe pain. 28 tablet 0   No current facility-administered medications on file prior to visit.      Past Medical History:  Diagnosis Date  . 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 Chronic kidney disease   . Diabetes mellitus    adult-onset, type 2  . Exogenous obesity   . GERD (gastroesophageal reflux disease)   . Hypertension   . Left foot infection 06/21/2018  . Osteomyelitis of foot, left, acute ([place]) 07/04/2018   No Known Allergies  Social History   Socioeconomic History  . Marital status: Single    Spouse name: Not on file  . Number of children: 6  . Years of education: 5  . Highest education level: Not on file  Occupational History  . Occupation: Brewing technologist    Comment: [name]  . Occupation: newpaper delivery    Employer: [employer]  Social Needs  . Financial resource strain: Not on file  . Food  insecurity:    Worry: Not on file    Inability: Not on file  . Transportation needs:    Medical: Not on file    Non-medical: Not on file  Tobacco Use  . Smoking status: Never Smoker  . Smokeless tobacco: Never Used  Substance and Sexual Activity  . Alcohol use: No  . Drug use: No  . Sexual activity: Yes    Birth control/protection: Condom  Lifestyle  . Physical activity:    Days per week: Not on file    Minutes per session: Not on file  . Stress: Not on file  Relationships  . Social connections:    Talks on phone: Not on file    Gets together: Not on file    Attends religious service: Not on file    Active member of club or organization: Not on file    Attends meetings of clubs or organizations: Not on file    Relationship status: Not on file  Other Topics Concern  . Not on file  Social History Narrative  . Not on file    Vitals:  08/07/18 1150  BP: 124/83  Pulse: 86  Resp: 16  Temp: 98.2 F (36.8 C)  SpO2: 94%   Body mass index is 46.48 kg/m.  Physical Exam  Nursing note reviewed. Constitutional: He is oriented to person, place, and time. He appears well-developed. No distress.  HENT:  Head: Normocephalic and atraumatic.  Mouth/Throat: Oropharynx is clear and moist and mucous membranes are normal.  Eyes: Pupils are equal, round, and reactive to light. Conjunctivae are normal.  Cardiovascular: Normal rate and regular rhythm.  No murmur heard. Pulses:      Posterior tibial pulses are 2+ on the right side.  Respiratory: Effort normal and breath sounds normal. No respiratory distress.  GI: Soft. He exhibits no mass. There is no abdominal tenderness.  Musculoskeletal:        General: Edema (Pitting LE edema 2+,bilateral.) present. No tenderness.  Lymphadenopathy:    He has no cervical adenopathy.  Neurological: He is alert and oriented to person, place, and time. He has normal strength. No cranial nerve deficit.  Gait assisted by scooter for left foot.    Skin: Skin is warm. No rash noted. No erythema.  Psychiatric: He has a normal mood and affect.  Fairly groomed, good eye contact.     ASSESSMENT AND PLAN:  Joshua Massey was seen today for chronic disease management.  Orders Placed This Encounter  Procedures  . Basic metabolic panel  . Microalbumin / creatinine urine ratio  . VITAMIN D 25 Hydroxy (Vit-D Deficiency, Fractures)  . TSH  . Ambulatory referral to Nephrology   Lab Results  Component Value Date   CREATININE 3.25 (H) 08/07/2018   BUN 58 (H) 08/07/2018   NA 144 08/07/2018   K 4.0 08/07/2018   CL 108 08/07/2018   CO2 26 08/07/2018   Lab Results  Component Value Date   MICROALBUR 259.1 (H) 08/07/2018   Lab Results  Component Value Date   TSH 5.57 (H) 08/07/2018    Hyperlipidemia associated with type 2 diabetes mellitus ([place]) Recommend resuming atorvastatin 10 mg daily. Continue low-fat diet. He is not fasting today, so we will plan on checking lipid panel next visit.  Hypertensive heart disease with heart failure (HCC) Adequately controlled. No changes in current management. Low-salt diet to continue. Instructed to arrange an eye exam, he is overdue. Keep next appointment with cardiologist.   Type 2 diabetes mellitus with vascular disease ([place]) Hg A1c was not at goal in 05/2018. He is reporting better BS numbers. No changes in current management. Strongly recommend arranging eye exam, we discussed possible ophthalmologic complications from DM2. Follow-up in 3 months.  CKD (chronic kidney disease), stage IV ([place]) Problem gradually getting worse since 11/2017. Creatinine in average between 3 and mid 4.5-4.8, e GFR 14-17. Last BMP improved Cr and e GFR, 3.25 and 26 respectively. Adequate hydration, low-salt diet, and avoidance of NSAIDs. Continue BiDil. Adequate BP and glucose control. Appointment with nephrologist will be arranged.  Hypovitaminosis D Currently he is not on vitamin D  supplementation. Recommend resuming ergocalciferol 50,000 units every 2 weeks. Further recommendation will be given according to 25 OH vitamin D results.  Hypokalemia Currently he is not on K+ supplementation. Further recommendation will be given according to BMP results.   I stressed the importance of compliance with medications and with follow-up appointments.   Return in about 3 months (around 11/05/2018) for DM II,HTN.      G. [name], MD  [facility]. [place] office.

## 2018-08-07 NOTE — Assessment & Plan Note (Signed)
Hg A1c was not at goal in 05/2018. He is reporting better BS numbers. No changes in current management. Strongly recommend arranging eye exam, we discussed possible ophthalmologic complications from DM2. Follow-up in 3 months.

## 2018-08-07 NOTE — Assessment & Plan Note (Signed)
Problem gradually getting worse since 11/2017. Creatinine in average between 3 and mid 4.5-4.8, e GFR 14-17. Last BMP improved Cr and e GFR, 3.25 and 26 respectively. Adequate hydration, low-salt diet, and avoidance of NSAIDs. Continue BiDil. Adequate BP and glucose control. Appointment with nephrologist will be arranged.

## 2018-08-07 NOTE — Assessment & Plan Note (Signed)
Recommend resuming atorvastatin 10 mg daily. Continue low-fat diet. He is not fasting today, so we will plan on checking lipid panel next visit.

## 2018-08-07 NOTE — Patient Instructions (Addendum)
A few things to remember from today's visit:   Hypertensive heart disease with heart failure ([place]) - Plan: Basic metabolic panel  Type 2 diabetes mellitus with vascular disease ([place]) - Plan: Basic metabolic panel, Microalbumin / creatinine urine ratio  CKD (chronic kidney disease), stage IV ([place]) - Plan: Ambulatory referral to Nephrology  Hyperlipidemia associated with type 2 diabetes mellitus ([place])  Hypovitaminosis D - Plan: VITAMIN D 25 Hydroxy (Vit-D Deficiency, Fractures)  No changes today. Monitor legs swelling. Keep appointment with cardiologist.   Please be sure medication list is accurate. If a new problem present, please set up appointment sooner than planned today.

## 2018-08-08 ENCOUNTER — Other Ambulatory Visit: Payer: Self-pay | Admitting: *Deleted

## 2018-08-08 DIAGNOSIS — E1159 Type 2 diabetes mellitus with other circulatory complications: Secondary | ICD-10-CM

## 2018-08-09 ENCOUNTER — Encounter: Payer: Self-pay | Admitting: Family Medicine

## 2018-08-09 MED ORDER — VITAMIN D (ERGOCALCIFEROL) 1.25 MG (50000 UNIT) PO CAPS: 50000.0000 [IU] | ORAL_CAPSULE | ORAL | 1 refills | Status: DC

## 2018-08-10 ENCOUNTER — Other Ambulatory Visit: Payer: Self-pay | Admitting: *Deleted

## 2018-08-10 ENCOUNTER — Other Ambulatory Visit: Payer: Self-pay | Admitting: Family Medicine

## 2018-08-10 DIAGNOSIS — E559 Vitamin D deficiency, unspecified: Secondary | ICD-10-CM

## 2018-08-10 MED ORDER — INSULIN GLARGINE 100 UNIT/ML SOLOSTAR PEN: 5.0000 [IU] | PEN_INJECTOR | Freq: Every day | SUBCUTANEOUS | 3 refills | Status: DC

## 2018-08-10 MED ORDER — VITAMIN D (ERGOCALCIFEROL) 1.25 MG (50000 UNIT) PO CAPS: 50000.0000 [IU] | ORAL_CAPSULE | ORAL | 1 refills | Status: DC

## 2018-08-10 MED ORDER — INSULIN ASPART 100 UNIT/ML FLEXPEN: 0.0000 [IU] | PEN_INJECTOR | Freq: Three times a day (TID) | SUBCUTANEOUS | 5 refills | Status: DC

## 2018-08-13 ENCOUNTER — Other Ambulatory Visit: Payer: Self-pay | Admitting: *Deleted

## 2018-08-13 DIAGNOSIS — E1159 Type 2 diabetes mellitus with other circulatory complications: Secondary | ICD-10-CM

## 2018-08-13 MED ORDER — INSULIN ASPART 100 UNIT/ML FLEXPEN: 0.0000 [IU] | PEN_INJECTOR | Freq: Three times a day (TID) | SUBCUTANEOUS | 5 refills | Status: DC

## 2018-08-15 ENCOUNTER — Ambulatory Visit (INDEPENDENT_AMBULATORY_CARE_PROVIDER_SITE_OTHER): Payer: BLUE CROSS/BLUE SHIELD | Admitting: Family

## 2018-08-15 ENCOUNTER — Ambulatory Visit (INDEPENDENT_AMBULATORY_CARE_PROVIDER_SITE_OTHER): Payer: BLUE CROSS/BLUE SHIELD | Admitting: Orthopedic Surgery

## 2018-08-16 ENCOUNTER — Ambulatory Visit (INDEPENDENT_AMBULATORY_CARE_PROVIDER_SITE_OTHER): Payer: BLUE CROSS/BLUE SHIELD | Admitting: Physician Assistant

## 2018-08-17 ENCOUNTER — Telehealth: Payer: Self-pay | Admitting: Family Medicine

## 2018-08-17 NOTE — Telephone Encounter (Signed)
Rx resent on 08/10/2018 with correct directions by Dr. [name]. Nothing further needed at this time.

## 2018-08-17 NOTE — Telephone Encounter (Signed)
Copied from [place] [id number]. Topic: Quick Communication - See Telephone Encounter >> Aug 17, 2018  8:39 AM Sheran Luz wrote: CRM for notification. See Telephone encounter for: 08/17/18.  Pharmacy calling to request clarification on the medication Vitamin D, Ergocalciferol, (DRISDOL) 1.25 MG (50000 UT) CAPS capsule specifically the directions. Please advise.   [facility] pharmacy # [phone]

## 2018-08-17 NOTE — Telephone Encounter (Signed)
Attempted to call pharmacy regarding Rx drisdol and change from every 2 weeks to every week. No answer at pharmacy number.

## 2018-08-20 ENCOUNTER — Ambulatory Visit (INDEPENDENT_AMBULATORY_CARE_PROVIDER_SITE_OTHER): Payer: BLUE CROSS/BLUE SHIELD | Admitting: Physician Assistant

## 2018-08-21 ENCOUNTER — Encounter (INDEPENDENT_AMBULATORY_CARE_PROVIDER_SITE_OTHER): Payer: Self-pay | Admitting: Physician Assistant

## 2018-08-21 ENCOUNTER — Ambulatory Visit (INDEPENDENT_AMBULATORY_CARE_PROVIDER_SITE_OTHER): Payer: BLUE CROSS/BLUE SHIELD | Admitting: Physician Assistant

## 2018-08-21 VITALS — Ht 71.0 in | Wt 333.2 lb

## 2018-08-21 DIAGNOSIS — E43 Unspecified severe protein-calorie malnutrition: Secondary | ICD-10-CM

## 2018-08-21 DIAGNOSIS — L97516 Non-pressure chronic ulcer of other part of right foot with bone involvement without evidence of necrosis: Secondary | ICD-10-CM

## 2018-08-21 DIAGNOSIS — N183 Chronic kidney disease, stage 3 unspecified: Secondary | ICD-10-CM

## 2018-08-21 DIAGNOSIS — Z794 Long term (current) use of insulin: Secondary | ICD-10-CM

## 2018-08-21 DIAGNOSIS — E11621 Type 2 diabetes mellitus with foot ulcer: Secondary | ICD-10-CM | POA: Diagnosis not present

## 2018-08-21 DIAGNOSIS — L97426 Non-pressure chronic ulcer of left heel and midfoot with bone involvement without evidence of necrosis: Secondary | ICD-10-CM

## 2018-08-21 DIAGNOSIS — Z945 Skin transplant status: Secondary | ICD-10-CM

## 2018-08-21 DIAGNOSIS — E1142 Type 2 diabetes mellitus with diabetic polyneuropathy: Secondary | ICD-10-CM

## 2018-08-21 DIAGNOSIS — I428 Other cardiomyopathies: Secondary | ICD-10-CM

## 2018-08-21 MED ORDER — OXYCODONE-ACETAMINOPHEN 5-325 MG PO TABS: 1.0000 | ORAL_TABLET | Freq: Four times a day (QID) | ORAL | 0 refills | Status: DC | PRN

## 2018-08-21 NOTE — Progress Notes (Signed)
Office Visit Note   Patient: Joshua Massey           Date of Birth: [birth year]           MRN: [id number] Visit Date: 08/21/2018              Requested by: [name], [name], MD [address] [place], [place] [address] PCP: [name], [name], MD  Chief Complaint  Patient presents with  . Left Foot - Routine Post Op    07/04/2018 I &D      HPI: The patient is a 43 yo gentleman with diabetic insensate neuropathy who is seen for post operative follow up following STSG, allograft to a large ulcer over the left plantar mid foot ulcer on 07/04/2018. He also has an ulcer of the right great toe and reports this is improving, but he may still need operative intervention in the future once he has healed sufficiently on the left foot.  ~ 7 weeks post op:  He reports still having some drainage from the left foot wound. They have been using silvercel to the area every other day. He reports blood sugars 140-150 and never over 200 more recently. He is non weight bearing with a knee scooter and post op shoe, but is starting to develop some equina varus of the foot with rest.  Assessment & Plan: Visit Diagnoses:  1. Diabetic ulcer of left midfoot associated with type 2 diabetes mellitus, with bone involvement without evidence of necrosis ([place])   2. S/P split thickness skin graft   3. Diabetic ulcer of toe of right foot associated with type 2 diabetes mellitus, with bone involvement without evidence of necrosis ([place])   4. Type 2 diabetes mellitus with diabetic polyneuropathy, with long-term current use of insulin ([place])   5. Non-ischemic cardiomyopathy, EF < 20%   6. CKD (chronic kidney disease), stage III ([place])   7. Severe protein-calorie malnutrition ([place])     Plan: Will begin some iodosorb ointment to the central open area over the left midfoot residual wound daily after cleaning with Dial soap and water. Continue to off load the foot using a knee scooter and elevate as much as possible. Will  also place in a short fracture boot to keep better alignment with concerns for Charcot foot. He will follow up next week.   Follow-Up Instructions: Return in about 9 days (around 08/30/2018).   Ortho Exam  Patient is alert, oriented, no adenopathy, well-dressed, normal affect, normal respiratory effort. The left mid foot ulcer is slowly healing the residual open area is 3.4 x 1.5 x 0.4 cm with pale pink tissue over the wound bed, some slough and slight odor and drainage. Peri wound xerosis and callus.  He rests the foot in equina varus when sitting around at home and here in the clinic. He reports pain over the area. He lacks ~10 degrees of dorsiflexion. He has palpable pedal pulses.     Imaging: No results found. No images are attached to the encounter.  Labs: Lab Results  Component Value Date   HGBA1C 8.5 (H) 06/10/2018   HGBA1C 11.5 (H) 11/28/2017   HGBA1C 9.7 (H) 12/20/2016   REPTSTATUS 06/15/2018 FINAL 06/10/2018   GRAMSTAIN  06/10/2018    RARE WBC PRESENT, PREDOMINANTLY PMN FEW GRAM POSITIVE COCCI IN PAIRS RARE GRAM NEGATIVE RODS    CULT  06/10/2018    FEW MORGANELLA MORGANII MODERATE BACTEROIDES THETAIOTAOMICRON BETA LACTAMASE POSITIVE Performed at [facility], [place]  [address], [place], [place] [address]    Warner MORGANII 06/10/2018     Lab Results  Component Value Date   ALBUMIN 1.7 (L) 06/10/2018   ALBUMIN 1.9 (L) 12/07/2017   ALBUMIN 1.9 (L) 12/06/2017    Body mass index is 46.48 kg/m.  Orders:  No orders of the defined types were placed in this encounter.  Meds ordered this encounter  Medications  . oxyCODONE-acetaminophen (PERCOCET/ROXICET) 5-325 MG tablet    Sig: Take 1 tablet by mouth every 6 (six) hours as needed for moderate pain or severe pain.    Dispense:  28 tablet    Refill:  0     Procedures: No procedures performed  Clinical Data: No additional findings.  ROS:  All other systems negative, except as noted in the  HPI. Review of Systems  Objective: Vital Signs: Ht 5\' 11"  (1.803 m)   Wt (!) 333 lb 4 oz (151.2 kg)   BMI 46.48 kg/m   Specialty Comments:  No specialty comments available.  PMFS History: Patient Active Problem List   Diagnosis Date Noted  . S/P split thickness skin graft 08/02/2018  . Subacute osteomyelitis, left ankle and foot ([place])   . Non-compliant patient 12/18/2017  . Chronic venous hypertension (idiopathic) with ulcer and inflammation of bilateral lower extremity (HCC)   . Hypovitaminosis D 11/27/2017  . Diabetic foot ulcer ([place]) 11/27/2017  . Cellulitis in diabetic foot ([place]) 11/27/2017  . Acute heart failure ([place]) 11/27/2017  . Volume overload 11/27/2017  . Hepatopathy 11/27/2017  . Hypokalemia 05/31/2017  . Diabetic peripheral neuropathy ([place]) 12/20/2016  . Hyperlipidemia associated with type 2 diabetes mellitus ([place]) 11/27/2016  . Shingles 10/11/2016  . Status post peripherally inserted central catheter (PICC) central line placement 09/12/2016  . Medication monitoring encounter 09/12/2016  . Arthritis, septic ([place])   . CKD (chronic kidney disease), stage IV ([place])   . AKI (acute kidney injury) ([place])   . Peripheral edema   . Acute renal failure ([place]) 08/17/2016  . Acute systolic CHF (congestive heart failure) ([place]) 08/17/2016  . Type 2 diabetes mellitus with vascular disease ([place]) 10/21/2013  . Chronic systolic heart failure ([place]) 02/27/2012  . Coronary artery disease, 50% LAD at cath 02/16/12 02/23/2012  . Family history of coronary artery disease 02/23/2012  . Cardiogenic shock ([place]) 02/17/2012  . NSVT (nonsustained ventricular tachycardia) () 02/16/2012  . Obesity 02/16/2012  . Acute on chronic systolic CHF (congestive heart failure) ([place]) 02/16/2012  . CHF (congestive heart failure) ([place]) 02/15/2012  . Hypertension 02/15/2012  . Non-ischemic cardiomyopathy, EF < 20% 02/15/2012  . TINEA CRURIS 09/25/2009  . ALLERGIC RHINITIS CAUSE UNSPECIFIED 09/25/2009    . SCROTAL ABSCESS 09/17/2009  . Hypertensive heart disease with heart failure ([place]) 01/26/2009   Past Medical History:  Diagnosis Date  . 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 Chronic kidney disease   . Diabetes mellitus    adult-onset, type 2  . Exogenous obesity   . GERD (gastroesophageal reflux disease)   . Hypertension   . Left foot infection 06/21/2018  . Osteomyelitis of foot, left, acute ([place]) 07/04/2018    Family History  Problem Relation Age of Onset  . Coronary artery disease Brother        CABG at 12, DM  . Heart disease Brother 34       CAD  . Diabetes Mother   . Hypertension Mother   . Hyperlipidemia Mother   . Heart disease Mother   .  Diabetes Father   . Hypertension Father     Past Surgical History:  Procedure Laterality Date  . ANKLE SURGERY  2003   plate and screws  . APPLICATION OF WOUND VAC Left 06/12/2018   Procedure: APPLICATION OF WOUND VAC;  Surgeon: Mcarthur Rossetti, MD;  Location: [facility];  Service: Orthopedics;  Laterality: Left;  . CARDIAC CATHETERIZATION  02/16/2012   50% LAD lesion in mid vessel, otherwise no significant obstructive CAD  . HIP PINNING  1990   bilateral  . I&D EXTREMITY Left 06/10/2018   Procedure: IRRIGATION AND DEBRIDEMENT OF FOOT;  Surgeon: Mcarthur Rossetti, MD;  Location: [facility];  Service: Orthopedics;  Laterality: Left;  . I&D EXTREMITY Left 06/12/2018   Procedure: REPEAT IRRIGATION AND DEBRIDEMENT LEFT FOOT;  Surgeon: Mcarthur Rossetti, MD;  Location: [facility];  Service: Orthopedics;  Laterality: Left;  . I&D EXTREMITY Left 07/04/2018   Procedure: DEBRIDEMENT OF LEFT FOOT;  Surgeon: Newt Minion, MD;  Location: [facility];  Service: Orthopedics;  Laterality: Left;  . IR GENERIC HISTORICAL  09/01/2016   IR FLUORO GUIDE CV LINE RIGHT 09/01/2016 Marybelle Killings, MD [facility]  . IR GENERIC HISTORICAL  09/01/2016   IR US GUIDE VASC ACCESS RIGHT 09/01/2016 Marybelle Killings, MD [facility]  . IR  GENERIC HISTORICAL  09/16/2016   IR REMOVAL TUN CV CATH W/O FL 09/16/2016 Arne Cleveland, MD [facility]  . KNEE ARTHROSCOPY Left 08/30/2016   Procedure: ARTHROSCOPY KNEE;  Surgeon: Renette Butters, MD;  Location: [facility];  Service: Orthopedics;  Laterality: Left;  . LEFT AND RIGHT HEART CATHETERIZATION WITH CORONARY ANGIOGRAM N/A 02/16/2012   Procedure: LEFT AND RIGHT HEART CATHETERIZATION WITH CORONARY ANGIOGRAM;  Surgeon: Pixie Casino, MD;  Location: [facility];  Service: Cardiovascular;  Laterality: N/A;  . TRANSTHORACIC ECHOCARDIOGRAM  08/31/2012   EF 35-40%, no significant wall motion abnormalities, diastolic relaxation abnormality - no longer needed LifeVest; LV cavity size mod dilated with mod conc hypertrophy, systolic function mod reduced; mild MR, LA mod dilated   Social History   Occupational History  . Occupation: Brewing technologist    Comment: [name]  . Occupation: newpaper delivery    Employer: [employer]  Tobacco Use  . Smoking status: Never Smoker  . Smokeless tobacco: Never Used  Substance and Sexual Activity  . Alcohol use: No  . Drug use: No  . Sexual activity: Yes    Birth control/protection: Condom

## 2018-08-28 ENCOUNTER — Emergency Department (HOSPITAL_COMMUNITY): Payer: BLUE CROSS/BLUE SHIELD

## 2018-08-28 ENCOUNTER — Other Ambulatory Visit: Payer: Self-pay

## 2018-08-28 ENCOUNTER — Encounter (HOSPITAL_COMMUNITY): Payer: Self-pay | Admitting: Emergency Medicine

## 2018-08-28 ENCOUNTER — Emergency Department (HOSPITAL_COMMUNITY)
Admission: EM | Admit: 2018-08-28 | Discharge: 2018-08-28 | Disposition: A | Discharge status: Home / Self Care | Payer: BLUE CROSS/BLUE SHIELD | Attending: Emergency Medicine | Admitting: Emergency Medicine

## 2018-08-28 DIAGNOSIS — E1140 Type 2 diabetes mellitus with diabetic neuropathy, unspecified: Secondary | ICD-10-CM | POA: Diagnosis not present

## 2018-08-28 DIAGNOSIS — Z794 Long term (current) use of insulin: Secondary | ICD-10-CM | POA: Diagnosis not present

## 2018-08-28 DIAGNOSIS — Z7901 Long term (current) use of anticoagulants: Secondary | ICD-10-CM | POA: Insufficient documentation

## 2018-08-28 DIAGNOSIS — R2243 Localized swelling, mass and lump, lower limb, bilateral: Secondary | ICD-10-CM | POA: Insufficient documentation

## 2018-08-28 DIAGNOSIS — I5022 Chronic systolic (congestive) heart failure: Secondary | ICD-10-CM | POA: Diagnosis not present

## 2018-08-28 DIAGNOSIS — Z79899 Other long term (current) drug therapy: Secondary | ICD-10-CM | POA: Diagnosis not present

## 2018-08-28 DIAGNOSIS — N184 Chronic kidney disease, stage 4 (severe): Secondary | ICD-10-CM | POA: Insufficient documentation

## 2018-08-28 DIAGNOSIS — R600 Localized edema: Secondary | ICD-10-CM

## 2018-08-28 DIAGNOSIS — E1122 Type 2 diabetes mellitus with diabetic chronic kidney disease: Secondary | ICD-10-CM | POA: Insufficient documentation

## 2018-08-28 DIAGNOSIS — I130 Hypertensive heart and chronic kidney disease with heart failure and stage 1 through stage 4 chronic kidney disease, or unspecified chronic kidney disease: Secondary | ICD-10-CM | POA: Diagnosis not present

## 2018-08-28 DIAGNOSIS — R0602 Shortness of breath: Secondary | ICD-10-CM | POA: Diagnosis not present

## 2018-08-28 LAB — CBC
HCT: 36.8 % — ABNORMAL LOW (ref 39.0–52.0)
Hemoglobin: 11.3 g/dL — ABNORMAL LOW (ref 13.0–17.0)
MCH: 28.8 pg (ref 26.0–34.0)
MCHC: 30.7 g/dL (ref 30.0–36.0)
MCV: 93.9 fL (ref 80.0–100.0)
Platelets: 262 10*3/uL (ref 150–400)
RBC: 3.92 MIL/uL — ABNORMAL LOW (ref 4.22–5.81)
RDW: 18.7 % — ABNORMAL HIGH (ref 11.5–15.5)
WBC: 4.8 10*3/uL (ref 4.0–10.5)
nRBC: 0 % (ref 0.0–0.2)

## 2018-08-28 LAB — BASIC METABOLIC PANEL
Anion gap: 9 (ref 5–15)
BUN: 61 mg/dL — AB (ref 6–20)
CO2: 21 mmol/L — ABNORMAL LOW (ref 22–32)
Calcium: 7.9 mg/dL — ABNORMAL LOW (ref 8.9–10.3)
Chloride: 109 mmol/L (ref 98–111)
Creatinine, Ser: 3.64 mg/dL — ABNORMAL HIGH (ref 0.61–1.24)
GFR calc Af Amer: 23 mL/min — ABNORMAL LOW (ref >60)
GFR, EST NON AFRICAN AMERICAN: 20 mL/min — AB (ref >60)
Glucose, Bld: 124 mg/dL — ABNORMAL HIGH (ref 70–99)
Potassium: 3.9 mmol/L (ref 3.5–5.1)
SODIUM: 139 mmol/L (ref 135–145)

## 2018-08-28 MED ORDER — SODIUM CHLORIDE 0.9% FLUSH: 3.0000 mL | Freq: Once | INTRAVENOUS | Status: DC

## 2018-08-28 NOTE — ED Provider Notes (Signed)
[facility] EMERGENCY DEPARTMENT Provider Note   CSN: [id number] Arrival date & time: 08/28/18  1527    History   Chief Complaint Chief Complaint  Patient presents with  . Leg Swelling    HPI MERTON WADLOW is a 43 y.o. male.     The history is provided by the patient.  Illness  Location:  Legs Quality:  Swelling Severity:  Mild Onset quality:  Gradual Timing:  Constant Progression:  Worsening Chronicity:  Chronic Context:  Hx of CHF, worsening leg swelling but denies chest pain, SOB. Patient on blood thinner. Concern he needs IV lasix. Relieved by:  Nothing Worsened by:  Nothing Associated symptoms: no abdominal pain, no chest pain, no congestion, no cough, no ear pain, no fatigue, no fever, no headaches, no rash, no rhinorrhea, no shortness of breath, no sore throat and no vomiting     Past Medical History:  Diagnosis Date  . 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 Chronic kidney disease   . Diabetes mellitus    adult-onset, type 2  . Exogenous obesity   . GERD (gastroesophageal reflux disease)   . Hypertension   . Left foot infection 06/21/2018  . Osteomyelitis of foot, left, acute ([place]) 07/04/2018    Patient Active Problem List   Diagnosis Date Noted  . S/P split thickness skin graft 08/02/2018  . Subacute osteomyelitis, left ankle and foot ([place])   . Non-compliant patient 12/18/2017  . Chronic venous hypertension (idiopathic) with ulcer and inflammation of bilateral lower extremity (HCC)   . Hypovitaminosis D 11/27/2017  . Diabetic foot ulcer ([place]) 11/27/2017  . Cellulitis in diabetic foot ([place]) 11/27/2017  . Acute heart failure ([place]) 11/27/2017  . Volume overload 11/27/2017  . Hepatopathy 11/27/2017  . Hypokalemia 05/31/2017  . Diabetic peripheral neuropathy ([place]) 12/20/2016  . Hyperlipidemia associated with type 2 diabetes mellitus ([place]) 11/27/2016  . Shingles 10/11/2016  . Status post peripherally  inserted central catheter (PICC) central line placement 09/12/2016  . Medication monitoring encounter 09/12/2016  . Arthritis, septic ([place])   . CKD (chronic kidney disease), stage IV ([place])   . AKI (acute kidney injury) ([place])   . Peripheral edema   . Acute renal failure ([place]) 08/17/2016  . Acute systolic CHF (congestive heart failure) ([place]) 08/17/2016  . Type 2 diabetes mellitus with vascular disease ([place]) 10/21/2013  . Chronic systolic heart failure ([place]) 02/27/2012  . Coronary artery disease, 50% LAD at cath 02/16/12 02/23/2012  . Family history of coronary artery disease 02/23/2012  . Cardiogenic shock ([place]) 02/17/2012  . NSVT (nonsustained ventricular tachycardia) ([place]) 02/16/2012  . Obesity 02/16/2012  . Acute on chronic systolic CHF (congestive heart failure) ([place]) 02/16/2012  . CHF (congestive heart failure) ([place]) 02/15/2012  . Hypertension 02/15/2012  . Non-ischemic cardiomyopathy, EF < 20% 02/15/2012  . TINEA CRURIS 09/25/2009  . ALLERGIC RHINITIS CAUSE UNSPECIFIED 09/25/2009  . SCROTAL ABSCESS 09/17/2009  . Hypertensive heart disease with heart failure ([place]) 01/26/2009    Past Surgical History:  Procedure Laterality Date  . ANKLE SURGERY  2003   plate and screws  . APPLICATION OF WOUND VAC Left 06/12/2018   Procedure: APPLICATION OF WOUND VAC;  Surgeon: Mcarthur Rossetti, MD;  Location: [facility];  Service: Orthopedics;  Laterality: Left;  . CARDIAC CATHETERIZATION  02/16/2012   50% LAD lesion in mid vessel, otherwise no significant obstructive CAD  . HIP PINNING  1990   bilateral  . I&D EXTREMITY Left 06/10/2018  Procedure: IRRIGATION AND DEBRIDEMENT OF FOOT;  Surgeon: Mcarthur Rossetti, MD;  Location: [facility];  Service: Orthopedics;  Laterality: Left;  . I&D EXTREMITY Left 06/12/2018   Procedure: REPEAT IRRIGATION AND DEBRIDEMENT LEFT FOOT;  Surgeon: Mcarthur Rossetti, MD;  Location: [facility];  Service: Orthopedics;  Laterality: Left;  . I&D EXTREMITY Left  07/04/2018   Procedure: DEBRIDEMENT OF LEFT FOOT;  Surgeon: Newt Minion, MD;  Location: [facility];  Service: Orthopedics;  Laterality: Left;  . IR GENERIC HISTORICAL  09/01/2016   IR FLUORO GUIDE CV LINE RIGHT 09/01/2016 Marybelle Killings, MD [facility]  . IR GENERIC HISTORICAL  09/01/2016   IR US GUIDE VASC ACCESS RIGHT 09/01/2016 Marybelle Killings, MD [facility]  . IR GENERIC HISTORICAL  09/16/2016   IR REMOVAL TUN CV CATH W/O FL 09/16/2016 Arne Cleveland, MD [facility]  . KNEE ARTHROSCOPY Left 08/30/2016   Procedure: ARTHROSCOPY KNEE;  Surgeon: Renette Butters, MD;  Location: [facility];  Service: Orthopedics;  Laterality: Left;  . LEFT AND RIGHT HEART CATHETERIZATION WITH CORONARY ANGIOGRAM N/A 02/16/2012   Procedure: LEFT AND RIGHT HEART CATHETERIZATION WITH CORONARY ANGIOGRAM;  Surgeon: Pixie Casino, MD;  Location: [facility] CATH LAB;  Service: Cardiovascular;  Laterality: N/A;  . TRANSTHORACIC ECHOCARDIOGRAM  08/31/2012   EF 35-40%, no significant wall motion abnormalities, diastolic relaxation abnormality - no longer needed LifeVest; LV cavity size mod dilated with mod conc hypertrophy, systolic function mod reduced; mild MR, LA mod dilated        Home Medications    Prior to Admission medications   Medication Sig Start Date End Date Taking? Authorizing Provider  acetaminophen (TYLENOL) 500 MG tablet Take 1,000 mg by mouth every 6 (six) hours as needed for moderate pain or headache.    [provider]  amiodarone (PACERONE) 200 MG tablet Take 1 tablet (200 mg total) by mouth 2 (two) times daily. 08/07/18   [name], [name], MD  apixaban (ELIQUIS) 5 MG TABS tablet Take 1 tablet (5 mg total) by mouth 2 (two) times daily. 08/07/18   [name], [name], MD  atorvastatin (LIPITOR) 10 MG tablet Take 1 tablet (10 mg total) by mouth daily with supper. 08/07/18   [name], [name], MD  carvedilol (COREG) 25 MG tablet Take 1 tablet (25 mg total) by mouth 2 (two) times daily with a meal. 08/07/18   [name], Betty  G, MD  Continuous Blood Gluc Receiver (FREESTYLE LIBRE 14 DAY READER) DEVI 1 Device by Does not apply route daily. 01/10/18   [name], [name], MD  Continuous Blood Gluc Sensor (FREESTYLE LIBRE 14 DAY SENSOR) MISC 1 Device by Does not apply route daily. 01/10/18   [name], [name], MD  Ferrous Sulfate (IRON) 325 (65 Fe) MG TABS Take 1 tablet (325 mg total) by mouth 2 (two) times daily with a meal. 06/14/18   Dessa Phi, DO  glucose blood (FREESTYLE TEST STRIPS) test strip Use as instructed 09/02/16   Ghimire, Henreitta Leber, MD  glucose monitoring kit (FREESTYLE) monitoring kit 1 each by Does not apply route 4 (four) times daily - after meals and at bedtime. 1 month Diabetic Testing Supplies for QAC-QHS accuchecks. 09/02/16   Ghimire, Henreitta Leber, MD  insulin aspart (NOVOLOG FLEXPEN) 100 UNIT/ML FlexPen Inject 0-15 Units into the skin 3 (three) times daily with meals. 0-15U/Subcu/TIDw/meals//CBG70-120:0U/CBG121-150:2U/CBG151-200:3U/CBG201-250:5U/CBG251-300:8U/CBG301-350:11U/CBG351-400:15U//CBG>400:Call MD 08/13/18   [name], [name], MD  Insulin Glargine (LANTUS) 100 UNIT/ML Solostar Pen Inject 5 Units into the skin daily. 08/10/18  [name], [name], MD  Insulin Pen Needle 32G X 8 MM MISC Use as directed 09/02/16   Ghimire, Henreitta Leber, MD  isosorbide-hydrALAZINE (BIDIL) 20-37.5 MG tablet Take 1 tablet by mouth 3 (three) times daily. 08/07/18   [name], [name], MD  Lancets (FREESTYLE) lancets Use as instructed 09/02/16   Ghimire, Henreitta Leber, MD  oxyCODONE-acetaminophen (PERCOCET/ROXICET) 5-325 MG tablet Take 1 tablet by mouth every 6 (six) hours as needed for moderate pain or severe pain. 08/21/18   [place]burn, Neta Mends, PA-C  torsemide (DEMADEX) 20 MG tablet Take 3 tablets (60 mg total) by mouth daily. 08/07/18   [name], [name], MD  Vitamin D, Ergocalciferol, (DRISDOL) 1.25 MG (50000 UT) CAPS capsule Take 1 capsule (50,000 Units total) by mouth every 7 (seven) days. 08/10/18   [name], [name], MD    Family  History Family History  Problem Relation Age of Onset  . Coronary artery disease Brother        CABG at 18, DM  . Heart disease Brother 56       CAD  . Diabetes Mother   . Hypertension Mother   . Hyperlipidemia Mother   . Heart disease Mother   . Diabetes Father   . Hypertension Father     Social History Social History   Tobacco Use  . Smoking status: Never Smoker  . Smokeless tobacco: Never Used  Substance Use Topics  . Alcohol use: No  . Drug use: No     Allergies   Patient has no known allergies.   Review of Systems Review of Systems  Constitutional: Negative for chills, fatigue and fever.  HENT: Negative for congestion, ear pain, rhinorrhea and sore throat.   Eyes: Negative for pain and visual disturbance.  Respiratory: Negative for cough and shortness of breath.   Cardiovascular: Positive for leg swelling. Negative for chest pain and palpitations.  Gastrointestinal: Negative for abdominal pain and vomiting.  Genitourinary: Negative for dysuria and hematuria.  Musculoskeletal: Negative for arthralgias and back pain.  Skin: Negative for color change and rash.  Neurological: Negative for seizures, syncope and headaches.  All other systems reviewed and are negative.    Physical Exam Updated Vital Signs  ED Triage Vitals  Enc Vitals Group     BP 08/28/18 1553 (!) 135/97     Pulse Rate 08/28/18 1553 74     Resp 08/28/18 1553 18     Temp 08/28/18 1553 97.8 F (36.6 C)     Temp Source 08/28/18 1553 Oral     SpO2 08/28/18 1553 95 %     Weight 08/28/18 1600 300 lb (136.1 kg)     Height 08/28/18 1600 5' 11"  (1.803 m)     Head Circumference --      Peak Flow --      Pain Score 08/28/18 1600 0     Pain Loc --      Pain Edu? --      Excl. in [place]? --     Physical Exam Vitals signs and nursing note reviewed.  Constitutional:      General: He is not in acute distress.    Appearance: He is well-developed. He is obese. He is not ill-appearing.  HENT:      Head: Normocephalic and atraumatic.     Nose: Nose normal.  Eyes:     Extraocular Movements: Extraocular movements intact.     Conjunctiva/sclera: Conjunctivae normal.     Pupils: Pupils are equal, round, and reactive to  light.  Neck:     Musculoskeletal: Normal range of motion and neck supple.  Cardiovascular:     Rate and Rhythm: Normal rate and regular rhythm.     Pulses: Normal pulses.     Heart sounds: Normal heart sounds. No murmur.  Pulmonary:     Effort: Pulmonary effort is normal. No respiratory distress.     Breath sounds: Normal breath sounds.  Abdominal:     General: There is no distension.     Palpations: Abdomen is soft.     Tenderness: There is no abdominal tenderness.  Musculoskeletal:     Right lower leg: Edema present.     Left lower leg: Edema present.  Skin:    General: Skin is warm and dry.     Capillary Refill: Capillary refill takes less than 2 seconds.  Neurological:     General: No focal deficit present.     Mental Status: He is alert.  Psychiatric:        Mood and Affect: Mood normal.      ED Treatments / Results  Labs (all labs ordered are listed, but only abnormal results are displayed) Labs Reviewed  BASIC METABOLIC PANEL - Abnormal; Notable for the following components:      Result Value   CO2 21 (*)    Glucose, Bld 124 (*)    BUN 61 (*)    Creatinine, Ser 3.64 (*)    Calcium 7.9 (*)    GFR calc non Af Amer 20 (*)    GFR calc Af Amer 23 (*)    All other components within normal limits  CBC - Abnormal; Notable for the following components:   RBC 3.92 (*)    Hemoglobin 11.3 (*)    HCT 36.8 (*)    RDW 18.7 (*)    All other components within normal limits    EKG None  Radiology Dg Chest 2 View  Result Date: 08/28/2018 CLINICAL DATA:  43 year old male with lower extremity edema for 3 weeks. EXAM: CHEST - 2 VIEW COMPARISON:  11/27/2017 chest radiographs and earlier. FINDINGS: Chronic moderate to severe cardiomegaly. Cardiac  silhouette appears stable since 2019. Normal cardiac size and mediastinal contours. Visualized tracheal air column is within normal limits. No pneumothorax or pleural effusion. Pulmonary vascularity appears stable without overt edema. No confluent opacity. No acute osseous abnormality identified. Paucity of bowel gas in the upper abdomen. IMPRESSION: Moderate to severe cardiomegaly is stable since 2019 with no overt edema or acute cardiopulmonary abnormality. Electronically Signed   By: Genevie Ann M.D.   On: 08/28/2018 16:31    Procedures Procedures (including critical care time)  Medications Ordered in ED Medications  sodium chloride flush (NS) 0.9 % injection 3 mL (has no administration in time range)     Initial Impression / Assessment and Plan / ED Course  I have reviewed the triage vital signs and the nursing notes.  Pertinent labs & imaging results that were available during my care of the patient were reviewed by me and considered in my medical decision making (see chart for details).     JODI KAPPES is a 43 year old male with history of hypertension, heart failure, diabetes, CKD who presents to the ED with leg swelling.  Patient with unremarkable vitals.  No fever.  States that he has had leg swelling for the last several weeks.  He is concerned that he needs IV diuretics.  However, he denies any chest pain, shortness of breath.  Has normal  vitals.  Breathing well on room air.  Appears to have chronic venous stasis in his legs.  He is already on blood thinner.  No signs of DVT on exam.  Already had lab work and chest x-ray done prior to my evaluation.  No significant electrolyte abnormalities, leukocytosis.  Creatinine at baseline.  Chest x-ray shows no signs of volume overload, no pneumonia, no pneumothorax.  Patient is on torsemide 3 times a day.  States that he is compliant with his medication.  Saw his primary care doctor for the same recently.  I recommend that he continue elevation  of his legs when he is able to, recommend compression stockings.  Recommend follow-up with heart failure clinic.  At this time patient is clinically stable.  Likely chronic venous stasis versus but has multiple reasons to have fluid on his legs.  However there is no signs of respiratory distress and no need for IV diuretics.  Recommend that he continue home regimen and follow-up with his doctors.  Discharged from ED in good condition.  Given return precautions.  This chart was dictated using voice recognition software.  Despite best efforts to proofread,  errors can occur which can change the documentation meaning.    Final Clinical Impressions(s) / ED Diagnoses   Final diagnoses:  Bilateral lower extremity edema    ED Discharge Orders    None       Lennice Sites, DO 08/28/18 2148

## 2018-08-28 NOTE — Discharge Instructions (Addendum)
Follow up with heart failure.

## 2018-08-28 NOTE — ED Triage Notes (Signed)
Onset sent 3 weeks ago developed bilateral lower extremity edema. States seen primary doctor last week told to elevate legs for the swelling however patient states does not help.

## 2018-08-28 NOTE — ED Notes (Signed)
Patient verbalizes understanding of discharge instructions. Opportunity for questioning and answers were provided. Armband removed by staff, pt discharged from ED. Pt wheeled to lobby. Pt aware he needs to follow up with heart failure clinic.

## 2018-08-29 ENCOUNTER — Telehealth (HOSPITAL_COMMUNITY): Payer: Self-pay | Admitting: *Deleted

## 2018-08-29 ENCOUNTER — Ambulatory Visit: Payer: Self-pay | Admitting: Family Medicine

## 2018-08-29 ENCOUNTER — Encounter (HOSPITAL_COMMUNITY): Payer: Self-pay

## 2018-08-29 ENCOUNTER — Emergency Department (HOSPITAL_COMMUNITY): Payer: BLUE CROSS/BLUE SHIELD

## 2018-08-29 ENCOUNTER — Observation Stay (HOSPITAL_COMMUNITY): Payer: BLUE CROSS/BLUE SHIELD

## 2018-08-29 ENCOUNTER — Ambulatory Visit (HOSPITAL_COMMUNITY)
Admission: RE | Admit: 2018-08-29 | Discharge: 2018-08-29 | Disposition: A | Discharge status: Home / Self Care | Payer: BLUE CROSS/BLUE SHIELD | Source: Ambulatory Visit | Attending: Cardiology | Admitting: Cardiology

## 2018-08-29 ENCOUNTER — Inpatient Hospital Stay (HOSPITAL_COMMUNITY)
Admission: EM | Admit: 2018-08-29 | Discharge: 2018-09-06 | DRG: 291 | Disposition: A | Discharge status: Home Health | Payer: BLUE CROSS/BLUE SHIELD | Attending: Internal Medicine | Admitting: Internal Medicine

## 2018-08-29 ENCOUNTER — Other Ambulatory Visit: Payer: Self-pay

## 2018-08-29 VITALS — BP 148/116 | Wt 350.4 lb

## 2018-08-29 DIAGNOSIS — I5082 Biventricular heart failure: Secondary | ICD-10-CM | POA: Diagnosis present

## 2018-08-29 DIAGNOSIS — Z6841 Body Mass Index (BMI) 40.0 and over, adult: Secondary | ICD-10-CM

## 2018-08-29 DIAGNOSIS — Z79899 Other long term (current) drug therapy: Secondary | ICD-10-CM | POA: Insufficient documentation

## 2018-08-29 DIAGNOSIS — E11621 Type 2 diabetes mellitus with foot ulcer: Secondary | ICD-10-CM | POA: Diagnosis not present

## 2018-08-29 DIAGNOSIS — M86272 Subacute osteomyelitis, left ankle and foot: Secondary | ICD-10-CM | POA: Diagnosis present

## 2018-08-29 DIAGNOSIS — I2510 Atherosclerotic heart disease of native coronary artery without angina pectoris: Secondary | ICD-10-CM | POA: Diagnosis present

## 2018-08-29 DIAGNOSIS — I509 Heart failure, unspecified: Secondary | ICD-10-CM

## 2018-08-29 DIAGNOSIS — K219 Gastro-esophageal reflux disease without esophagitis: Secondary | ICD-10-CM | POA: Diagnosis not present

## 2018-08-29 DIAGNOSIS — Z794 Long term (current) use of insulin: Secondary | ICD-10-CM

## 2018-08-29 DIAGNOSIS — L97529 Non-pressure chronic ulcer of other part of left foot with unspecified severity: Secondary | ICD-10-CM | POA: Insufficient documentation

## 2018-08-29 DIAGNOSIS — I4892 Unspecified atrial flutter: Secondary | ICD-10-CM | POA: Diagnosis not present

## 2018-08-29 DIAGNOSIS — E6609 Other obesity due to excess calories: Secondary | ICD-10-CM | POA: Diagnosis not present

## 2018-08-29 DIAGNOSIS — I87333 Chronic venous hypertension (idiopathic) with ulcer and inflammation of bilateral lower extremity: Secondary | ICD-10-CM

## 2018-08-29 DIAGNOSIS — L97429 Non-pressure chronic ulcer of left heel and midfoot with unspecified severity: Secondary | ICD-10-CM | POA: Diagnosis present

## 2018-08-29 DIAGNOSIS — I5023 Acute on chronic systolic (congestive) heart failure: Secondary | ICD-10-CM | POA: Diagnosis not present

## 2018-08-29 DIAGNOSIS — N179 Acute kidney failure, unspecified: Secondary | ICD-10-CM | POA: Diagnosis not present

## 2018-08-29 DIAGNOSIS — I5043 Acute on chronic combined systolic (congestive) and diastolic (congestive) heart failure: Secondary | ICD-10-CM | POA: Diagnosis present

## 2018-08-29 DIAGNOSIS — E1122 Type 2 diabetes mellitus with diabetic chronic kidney disease: Secondary | ICD-10-CM | POA: Diagnosis present

## 2018-08-29 DIAGNOSIS — I10 Essential (primary) hypertension: Secondary | ICD-10-CM

## 2018-08-29 DIAGNOSIS — I272 Pulmonary hypertension, unspecified: Secondary | ICD-10-CM | POA: Diagnosis present

## 2018-08-29 DIAGNOSIS — I313 Pericardial effusion (noninflammatory): Secondary | ICD-10-CM | POA: Diagnosis present

## 2018-08-29 DIAGNOSIS — N185 Chronic kidney disease, stage 5: Secondary | ICD-10-CM | POA: Insufficient documentation

## 2018-08-29 DIAGNOSIS — Z992 Dependence on renal dialysis: Secondary | ICD-10-CM

## 2018-08-29 DIAGNOSIS — Z833 Family history of diabetes mellitus: Secondary | ICD-10-CM

## 2018-08-29 DIAGNOSIS — I132 Hypertensive heart and chronic kidney disease with heart failure and with stage 5 chronic kidney disease, or end stage renal disease: Secondary | ICD-10-CM | POA: Insufficient documentation

## 2018-08-29 DIAGNOSIS — Z8249 Family history of ischemic heart disease and other diseases of the circulatory system: Secondary | ICD-10-CM

## 2018-08-29 DIAGNOSIS — E669 Obesity, unspecified: Secondary | ICD-10-CM | POA: Diagnosis present

## 2018-08-29 DIAGNOSIS — E785 Hyperlipidemia, unspecified: Secondary | ICD-10-CM | POA: Diagnosis present

## 2018-08-29 DIAGNOSIS — N2581 Secondary hyperparathyroidism of renal origin: Secondary | ICD-10-CM | POA: Diagnosis present

## 2018-08-29 DIAGNOSIS — Z7901 Long term (current) use of anticoagulants: Secondary | ICD-10-CM | POA: Insufficient documentation

## 2018-08-29 DIAGNOSIS — E1159 Type 2 diabetes mellitus with other circulatory complications: Secondary | ICD-10-CM

## 2018-08-29 DIAGNOSIS — E1169 Type 2 diabetes mellitus with other specified complication: Secondary | ICD-10-CM | POA: Diagnosis present

## 2018-08-29 DIAGNOSIS — L97919 Non-pressure chronic ulcer of unspecified part of right lower leg with unspecified severity: Secondary | ICD-10-CM

## 2018-08-29 DIAGNOSIS — I130 Hypertensive heart and chronic kidney disease with heart failure and stage 1 through stage 4 chronic kidney disease, or unspecified chronic kidney disease: Principal | ICD-10-CM | POA: Diagnosis present

## 2018-08-29 DIAGNOSIS — I87393 Chronic venous hypertension (idiopathic) with other complications of bilateral lower extremity: Secondary | ICD-10-CM | POA: Diagnosis present

## 2018-08-29 DIAGNOSIS — N186 End stage renal disease: Secondary | ICD-10-CM

## 2018-08-29 DIAGNOSIS — L97929 Non-pressure chronic ulcer of unspecified part of left lower leg with unspecified severity: Secondary | ICD-10-CM

## 2018-08-29 DIAGNOSIS — N184 Chronic kidney disease, stage 4 (severe): Secondary | ICD-10-CM | POA: Diagnosis present

## 2018-08-29 DIAGNOSIS — Z8349 Family history of other endocrine, nutritional and metabolic diseases: Secondary | ICD-10-CM

## 2018-08-29 DIAGNOSIS — D631 Anemia in chronic kidney disease: Secondary | ICD-10-CM | POA: Diagnosis present

## 2018-08-29 DIAGNOSIS — I5022 Chronic systolic (congestive) heart failure: Secondary | ICD-10-CM | POA: Insufficient documentation

## 2018-08-29 DIAGNOSIS — E11610 Type 2 diabetes mellitus with diabetic neuropathic arthropathy: Secondary | ICD-10-CM | POA: Diagnosis present

## 2018-08-29 DIAGNOSIS — I371 Nonrheumatic pulmonary valve insufficiency: Secondary | ICD-10-CM | POA: Diagnosis present

## 2018-08-29 DIAGNOSIS — I480 Paroxysmal atrial fibrillation: Secondary | ICD-10-CM | POA: Diagnosis present

## 2018-08-29 LAB — CBC
HCT: 38 % — ABNORMAL LOW (ref 39.0–52.0)
Hemoglobin: 11.5 g/dL — ABNORMAL LOW (ref 13.0–17.0)
MCH: 28.7 pg (ref 26.0–34.0)
MCHC: 30.3 g/dL (ref 30.0–36.0)
MCV: 94.8 fL (ref 80.0–100.0)
Platelets: 270 10*3/uL (ref 150–400)
RBC: 4.01 MIL/uL — AB (ref 4.22–5.81)
RDW: 18.6 % — ABNORMAL HIGH (ref 11.5–15.5)
WBC: 6.1 10*3/uL (ref 4.0–10.5)
nRBC: 0 % (ref 0.0–0.2)

## 2018-08-29 LAB — BASIC METABOLIC PANEL
Anion gap: 8 (ref 5–15)
BUN: 62 mg/dL — ABNORMAL HIGH (ref 6–20)
CO2: 23 mmol/L (ref 22–32)
Calcium: 8.2 mg/dL — ABNORMAL LOW (ref 8.9–10.3)
Chloride: 110 mmol/L (ref 98–111)
Creatinine, Ser: 3.6 mg/dL — ABNORMAL HIGH (ref 0.61–1.24)
GFR calc Af Amer: 23 mL/min — ABNORMAL LOW (ref >60)
GFR calc non Af Amer: 20 mL/min — ABNORMAL LOW (ref >60)
Glucose, Bld: 154 mg/dL — ABNORMAL HIGH (ref 70–99)
Potassium: 3.9 mmol/L (ref 3.5–5.1)
Sodium: 141 mmol/L (ref 135–145)

## 2018-08-29 LAB — HEPATIC FUNCTION PANEL
ALT: 16 U/L (ref 0–44)
AST: 21 U/L (ref 15–41)
Albumin: 2.6 g/dL — ABNORMAL LOW (ref 3.5–5.0)
Alkaline Phosphatase: 87 U/L (ref 38–126)
Bilirubin, Direct: 0.3 mg/dL — ABNORMAL HIGH (ref 0.0–0.2)
Indirect Bilirubin: 0.6 mg/dL (ref 0.3–0.9)
Total Bilirubin: 0.9 mg/dL (ref 0.3–1.2)
Total Protein: 6.6 g/dL (ref 6.5–8.1)

## 2018-08-29 LAB — TSH: TSH: 5.906 u[IU]/mL — ABNORMAL HIGH (ref 0.350–4.500)

## 2018-08-29 LAB — BRAIN NATRIURETIC PEPTIDE: B Natriuretic Peptide: 2101.9 pg/mL — ABNORMAL HIGH (ref 0.0–100.0)

## 2018-08-29 LAB — LACTIC ACID, PLASMA: Lactic Acid, Venous: 1.3 mmol/L (ref 0.5–1.9)

## 2018-08-29 MED ORDER — ISOSORB DINITRATE-HYDRALAZINE 20-37.5 MG PO TABS
1.0000 | ORAL_TABLET | Freq: Three times a day (TID) | ORAL | Status: DC
  Administered 2018-08-30 – 2018-09-05 (×20): 1 via ORAL
  Filled 2018-08-29 (×22): qty 1

## 2018-08-29 MED ORDER — FUROSEMIDE 10 MG/ML IJ SOLN
60.0000 mg | Freq: Two times a day (BID) | INTRAMUSCULAR | Status: DC
  Administered 2018-08-30: 60 mg via INTRAVENOUS
  Filled 2018-08-29: qty 6

## 2018-08-29 MED ORDER — SODIUM CHLORIDE 0.9 % IV SOLN: 2.0000 g | Freq: Two times a day (BID) | INTRAVENOUS | Status: DC

## 2018-08-29 MED ORDER — ACETAMINOPHEN 325 MG PO TABS: 650.0000 mg | ORAL_TABLET | ORAL | Status: DC | PRN

## 2018-08-29 MED ORDER — METRONIDAZOLE IN NACL 5-0.79 MG/ML-% IV SOLN
500.0000 mg | Freq: Three times a day (TID) | INTRAVENOUS | Status: DC
  Administered 2018-08-30 (×2): 500 mg via INTRAVENOUS
  Filled 2018-08-29: qty 100

## 2018-08-29 MED ORDER — CARVEDILOL 25 MG PO TABS
25.0000 mg | ORAL_TABLET | Freq: Two times a day (BID) | ORAL | Status: DC
  Administered 2018-08-30 – 2018-09-06 (×15): 25 mg via ORAL
  Filled 2018-08-29 (×15): qty 1

## 2018-08-29 MED ORDER — SODIUM CHLORIDE 0.9 % IV SOLN
2.0000 g | Freq: Two times a day (BID) | INTRAVENOUS | Status: DC
  Administered 2018-08-30: 2 g via INTRAVENOUS
  Filled 2018-08-29: qty 2

## 2018-08-29 MED ORDER — ISOSORB DINITRATE-HYDRALAZINE 20-37.5 MG PO TABS
1.0000 | ORAL_TABLET | Freq: Three times a day (TID) | ORAL | Status: DC
  Filled 2018-08-29: qty 1

## 2018-08-29 MED ORDER — VANCOMYCIN HCL IN DEXTROSE 1-5 GM/200ML-% IV SOLN
1000.0000 mg | Freq: Once | INTRAVENOUS | Status: DC
  Filled 2018-08-29: qty 200

## 2018-08-29 MED ORDER — INSULIN ASPART 100 UNIT/ML ~~LOC~~ SOLN
0.0000 [IU] | SUBCUTANEOUS | Status: DC
  Administered 2018-08-30 (×3): 1 [IU] via SUBCUTANEOUS

## 2018-08-29 MED ORDER — VANCOMYCIN HCL 10 G IV SOLR
2000.0000 mg | Freq: Once | INTRAVENOUS | Status: AC
  Administered 2018-08-29: 2000 mg via INTRAVENOUS
  Filled 2018-08-29: qty 2000

## 2018-08-29 MED ORDER — VANCOMYCIN HCL 10 G IV SOLR: 2000.0000 mg | INTRAVENOUS | Status: DC

## 2018-08-29 MED ORDER — SODIUM CHLORIDE 0.9 % IV SOLN
250.0000 mL | INTRAVENOUS | Status: DC | PRN
  Administered 2018-08-30 – 2018-09-04 (×4): 250 mL via INTRAVENOUS

## 2018-08-29 MED ORDER — FUROSEMIDE 10 MG/ML IJ SOLN
60.0000 mg | Freq: Once | INTRAMUSCULAR | Status: AC
  Administered 2018-08-29: 60 mg via INTRAVENOUS
  Filled 2018-08-29: qty 6

## 2018-08-29 MED ORDER — ONDANSETRON HCL 4 MG/2ML IJ SOLN: 4.0000 mg | Freq: Four times a day (QID) | INTRAMUSCULAR | Status: DC | PRN

## 2018-08-29 MED ORDER — AMIODARONE HCL 200 MG PO TABS
200.0000 mg | ORAL_TABLET | Freq: Two times a day (BID) | ORAL | Status: DC
  Administered 2018-08-30 – 2018-09-06 (×16): 200 mg via ORAL
  Filled 2018-08-29 (×17): qty 1

## 2018-08-29 MED ORDER — PIPERACILLIN-TAZOBACTAM 3.375 G IVPB 30 MIN
3.3750 g | Freq: Once | INTRAVENOUS | Status: AC
  Administered 2018-08-29: 3.375 g via INTRAVENOUS
  Filled 2018-08-29: qty 50

## 2018-08-29 MED ORDER — OXYCODONE-ACETAMINOPHEN 5-325 MG PO TABS
1.0000 | ORAL_TABLET | Freq: Four times a day (QID) | ORAL | Status: DC | PRN
  Filled 2018-08-29: qty 1

## 2018-08-29 MED ORDER — HEPARIN (PORCINE) 25000 UT/250ML-% IV SOLN
1800.0000 [IU]/h | INTRAVENOUS | Status: DC
  Administered 2018-08-30: 1500 [IU]/h via INTRAVENOUS
  Filled 2018-08-29: qty 250

## 2018-08-29 MED ORDER — SODIUM CHLORIDE 0.9% FLUSH: 3.0000 mL | INTRAVENOUS | Status: DC | PRN

## 2018-08-29 MED ORDER — SODIUM CHLORIDE 0.9% FLUSH
3.0000 mL | Freq: Two times a day (BID) | INTRAVENOUS | Status: DC
  Administered 2018-08-29 – 2018-09-06 (×15): 3 mL via INTRAVENOUS

## 2018-08-29 MED ORDER — APIXABAN 5 MG PO TABS
5.0000 mg | ORAL_TABLET | Freq: Two times a day (BID) | ORAL | Status: DC
  Filled 2018-08-29: qty 1

## 2018-08-29 NOTE — Telephone Encounter (Signed)
FYI sent to Dr. Jordan 

## 2018-08-29 NOTE — ED Provider Notes (Signed)
[facility] EMERGENCY DEPARTMENT Provider Note   CSN: [id number] Arrival date & time: 08/29/18  1244    History   Chief Complaint Chief Complaint  Patient presents with   Leg Swelling    HPI Joshua Massey is a 43 y.o. male.     HPI Patient reports he is presenting because his leg swelling just continues to be severe.  He reports that it is been this way since he was in the hospital for his wound excision on the sole of his left foot.  He reports he has been taking Lasix but it does not get any better.  He reports is pretty uncomfortable having this swelling of his legs and his lower abdomen as well.  He denies he has any chest pain or shortness of breath.  He reports his girlfriend is doing the dressing changes for his foot wound.  He denies increased pain of the foot.  He denies fever, chills or general malaise. Past Medical History:  Diagnosis Date   CHF (congestive heart failure) ([place])    nonischemic cardiopathy   Chronic kidney disease    Diabetes mellitus    adult-onset, type 2   Exogenous obesity    GERD (gastroesophageal reflux disease)    Hypertension    Left foot infection 06/21/2018   Osteomyelitis of foot, left, acute ([place]) 07/04/2018    Patient Active Problem List   Diagnosis Date Noted   Acute on chronic combined systolic and diastolic CHF (congestive heart failure) ([place]) 08/29/2018   S/P split thickness skin graft 08/02/2018   Subacute osteomyelitis, left ankle and foot ([place])    Non-compliant patient 12/18/2017   Chronic venous hypertension (idiopathic) with ulcer and inflammation of bilateral lower extremity ([place])    Hypovitaminosis D 11/27/2017   Diabetic foot ulcer ([place]) 11/27/2017   Cellulitis in diabetic foot ([place]) 11/27/2017   Acute heart failure ([place]) 11/27/2017   Volume overload 11/27/2017   Hepatopathy 11/27/2017   Hypokalemia 05/31/2017   Diabetic peripheral neuropathy ([place]) 12/20/2016   Hyperlipidemia  associated with type 2 diabetes mellitus ([place]) 11/27/2016   Shingles 10/11/2016   Status post peripherally inserted central catheter (PICC) central line placement 09/12/2016   Medication monitoring encounter 09/12/2016   Arthritis, septic (HCC)    CKD (chronic kidney disease), stage IV ([place])    AKI (acute kidney injury) ([place])    Peripheral edema    Acute renal failure ([place]) 62/69/4854   Acute systolic CHF (congestive heart failure) ([place]) 08/17/2016   Type 2 diabetes mellitus with vascular disease ([place]) 62/70/3500   Chronic systolic heart failure ([place]) 02/27/2012   Coronary artery disease, 50% LAD at cath 02/16/12 02/23/2012   Family history of coronary artery disease 02/23/2012   Cardiogenic shock ([place]) 02/17/2012   NSVT (nonsustained ventricular tachycardia) ([place]) 02/16/2012   Obesity 02/16/2012   Acute on chronic systolic CHF (congestive heart failure) ([place]) 02/16/2012   CHF (congestive heart failure) ([place]) 02/15/2012   Hypertension 02/15/2012   Non-ischemic cardiomyopathy, EF < 20% 02/15/2012   TINEA CRURIS 09/25/2009   ALLERGIC RHINITIS CAUSE UNSPECIFIED 09/25/2009   SCROTAL ABSCESS 09/17/2009   Hypertensive heart disease with heart failure ([place]) 01/26/2009    Past Surgical History:  Procedure Laterality Date   ANKLE SURGERY  2003   plate and screws   APPLICATION OF WOUND VAC Left 06/12/2018   Procedure: APPLICATION OF WOUND VAC;  Surgeon: Mcarthur Rossetti, MD;  Location: [place];  Service: Orthopedics;  Laterality: Left;   CARDIAC CATHETERIZATION  02/16/2012   50% LAD lesion in mid vessel, otherwise no significant obstructive CAD   HIP PINNING  1990   bilateral   I&D EXTREMITY Left 06/10/2018   Procedure: IRRIGATION AND DEBRIDEMENT OF FOOT;  Surgeon: Mcarthur Rossetti, MD;  Location: [place];  Service: Orthopedics;  Laterality: Left;   I&D EXTREMITY Left 06/12/2018   Procedure: REPEAT IRRIGATION AND DEBRIDEMENT LEFT FOOT;  Surgeon:  Mcarthur Rossetti, MD;  Location: [place];  Service: Orthopedics;  Laterality: Left;   I&D EXTREMITY Left 07/04/2018   Procedure: DEBRIDEMENT OF LEFT FOOT;  Surgeon: Newt Minion, MD;  Location: [place];  Service: Orthopedics;  Laterality: Left;   IR GENERIC HISTORICAL  09/01/2016   IR FLUORO GUIDE CV LINE RIGHT 09/01/2016 Marybelle Killings, MD [facility]   IR GENERIC HISTORICAL  09/01/2016   IR US GUIDE VASC ACCESS RIGHT 09/01/2016 Marybelle Killings, MD [facility]   IR GENERIC HISTORICAL  09/16/2016   IR REMOVAL TUN CV CATH W/O FL 09/16/2016 Arne Cleveland, MD [facility]   KNEE ARTHROSCOPY Left 08/30/2016   Procedure: ARTHROSCOPY KNEE;  Surgeon: Renette Butters, MD;  Location: [place];  Service: Orthopedics;  Laterality: Left;   LEFT AND RIGHT HEART CATHETERIZATION WITH CORONARY ANGIOGRAM N/A 02/16/2012   Procedure: LEFT AND RIGHT HEART CATHETERIZATION WITH CORONARY ANGIOGRAM;  Surgeon: Pixie Casino, MD;  [facility] C[facility];  Service: Cardiovascular;  Laterality: N/A;   TRANSTHORACIC ECHOCARDIOGRAM  08/31/2012   EF 35-40%, no significant wall motion abnormalities, diastolic relaxation abnormality - no longer needed LifeVest; LV cavity size mod dilated with mod conc hypertrophy, systolic function mod reduced; mild MR, LA mod dilated        Home Medications    Prior to Admission medications   Medication Sig Start Date End Date Taking? Authorizing Provider  acetaminophen (TYLENOL) 500 MG tablet Take 1,000 mg by mouth every 6 (six) hours as needed for moderate pain or headache.   Yes [provider]  amiodarone (PACERONE) 200 MG tablet Take 1 tablet (200 mg total) by mouth 2 (two) times daily. 08/07/18  Yes [name], [name], MD  apixaban (ELIQUIS) 5 MG TABS tablet Take 1 tablet (5 mg total) by mouth 2 (two) times daily. 08/07/18  Yes [name], [name], MD  carvedilol (COREG) 25 MG tablet Take 1 tablet (25 mg total) by mouth 2 (two) times daily with a meal. 08/07/18  Yes [name], Betty  G, MD  Ferrous Sulfate (IRON) 325 (65 Fe) MG TABS Take 1 tablet (325 mg total) by mouth 2 (two) times daily with a meal. 06/14/18  Yes Dessa Phi, DO  insulin aspart (NOVOLOG FLEXPEN) 100 UNIT/ML FlexPen Inject 0-15 Units into the skin 3 (three) times daily with meals. 0-15U/Subcu/TIDw/meals//CBG70-120:0U/CBG121-150:2U/CBG151-200:3U/CBG201-250:5U/CBG251-300:8U/CBG301-350:11U/CBG351-400:15U//CBG>400:Call MD 08/13/18  Yes [name], [name], MD  Insulin Glargine (LANTUS) 100 UNIT/ML Solostar Pen Inject 5 Units into the skin daily. Patient taking differently: Inject 5 Units into the skin at bedtime.  08/10/18  Yes [name], [name], MD  isosorbide-hydrALAZINE (BIDIL) 20-37.5 MG tablet Take 1 tablet by mouth 3 (three) times daily. 08/07/18  Yes [name], [name], MD  oxyCODONE-acetaminophen (PERCOCET/ROXICET) 5-325 MG tablet Take 1 tablet by mouth every 6 (six) hours as needed for moderate pain or severe pain. 08/21/18  Yes [place]burn, Neta Mends, PA-C  torsemide (DEMADEX) 20 MG tablet Take 3 tablets (60 mg total) by mouth daily. 08/07/18  Yes [name], [name], MD  Vitamin D, Ergocalciferol, (DRISDOL) 1.25 MG (50000 UT) CAPS capsule Take  1 capsule (50,000 Units total) by mouth every 7 (seven) days. 08/10/18  Yes [name], [name], MD  atorvastatin (LIPITOR) 10 MG tablet Take 1 tablet (10 mg total) by mouth daily with supper. Patient not taking: Reported on 08/29/2018 08/07/18   [name], [name], MD  Continuous Blood Gluc Receiver (FREESTYLE LIBRE 14 DAY READER) DEVI 1 Device by Does not apply route daily. 01/10/18   [name], [name], MD  Continuous Blood Gluc Sensor (FREESTYLE LIBRE 14 DAY SENSOR) MISC 1 Device by Does not apply route daily. 01/10/18   [name], [name], MD  glucose blood (FREESTYLE TEST STRIPS) test strip Use as instructed 09/02/16   Ghimire, Henreitta Leber, MD  glucose monitoring kit (FREESTYLE) monitoring kit 1 each by Does not apply route 4 (four) times daily - after meals and at bedtime. 1 month Diabetic  Testing Supplies for QAC-QHS accuchecks. 09/02/16   Jonetta Osgood, MD  Insulin Pen Needle 32G X 8 MM MISC Use as directed 09/02/16   Jonetta Osgood, MD  Lancets (FREESTYLE) lancets Use as instructed 09/02/16   Jonetta Osgood, MD    Family History Family History  Problem Relation Age of Onset   Coronary artery disease Brother        CABG at 52, DM   Heart disease Brother 58       CAD   Diabetes Mother    Hypertension Mother    Hyperlipidemia Mother    Heart disease Mother    Diabetes Father    Hypertension Father     Social History Social History   Tobacco Use   Smoking status: Never Smoker   Smokeless tobacco: Never Used  Substance Use Topics   Alcohol use: No   Drug use: No     Allergies   Patient has no known allergies.   Review of Systems Review of Systems 10 Systems reviewed and are negative for acute change except as noted in the HPI.   Physical Exam Updated Vital Signs BP (!) 150/110 (BP Location: Right Arm)    Pulse 90    Temp 98 F (36.7 C)    Resp 18    SpO2 92%   Physical Exam Constitutional:      Comments: Patient alert and nontoxic.  Mental status clear.  No respiratory distress.  Central obesity.  HENT:     Head: Normocephalic and atraumatic.     Mouth/Throat:     Comments: Mucous membranes are moist.  Posterior oropharynx widely patent.  Dentition in moderately poor condition.  Dentition is intact but with significant plaque and gingivitis. Eyes:     Extraocular Movements: Extraocular movements intact.  Cardiovascular:     Rate and Rhythm: Normal rate and regular rhythm.  Pulmonary:     Effort: Pulmonary effort is normal.     Breath sounds: Normal breath sounds.  Abdominal:     Comments: Abdomen is obese and nontender.  Patient does have lower abdominal wall edema.  Consistent with anasarca.  See attached images.  No significant erythema.  Genitourinary:    Comments: Scrotum seems somewhat enlarged but is soft and  pliable.  No tense edema.  No discomfort for the patient. Musculoskeletal:     Comments: Bilateral lower extremity have large edema.  This goes up to the thighs and lower abdominal wall.  See attached images.  This appears chronic with peau d'orange appearance of medial thighs and abdomen.  Patient has skin thinning of the lower legs and chronic appearing venous  ulcers on the right lower remedy tibial surface.  Patient has a foot wound that was treated surgically several weeks ago on the left sole.  This has a foul-smelling drainage.  No significant erythema of the foot.  See attached images.  Skin:    General: Skin is warm and dry.  Neurological:     General: No focal deficit present.     Mental Status: He is oriented to person, place, and time.     Coordination: Coordination normal.  Psychiatric:        Mood and Affect: Mood normal.                ED Treatments / Results  Labs (all labs ordered are listed, but only abnormal results are displayed) Labs Reviewed  HEPATIC FUNCTION PANEL - Abnormal; Notable for the following components:      Result Value   Albumin 2.6 (*)    Bilirubin, Direct 0.3 (*)    All other components within normal limits  LACTIC ACID, PLASMA  LACTIC ACID, PLASMA    EKG None  Radiology Dg Chest 2 View  Result Date: 08/28/2018 CLINICAL DATA:  43 year old male with lower extremity edema for 3 weeks. EXAM: CHEST - 2 VIEW COMPARISON:  11/27/2017 chest radiographs and earlier. FINDINGS: Chronic moderate to severe cardiomegaly. Cardiac silhouette appears stable since 2019. Normal cardiac size and mediastinal contours. Visualized tracheal air column is within normal limits. No pneumothorax or pleural effusion. Pulmonary vascularity appears stable without overt edema. No confluent opacity. No acute osseous abnormality identified. Paucity of bowel gas in the upper abdomen. IMPRESSION: Moderate to severe cardiomegaly is stable since 2019 with no overt edema or  acute cardiopulmonary abnormality. Electronically Signed   By: Genevie Ann M.D.   On: 08/28/2018 16:31   Dg Foot Complete Left  Result Date: 08/29/2018 CLINICAL DATA:  Acute onset of left foot swelling and erythema. EXAM: LEFT FOOT - COMPLETE 3+ VIEW COMPARISON:  Left foot radiographs performed 06/09/2018, and left foot MRI performed 06/10/2018 FINDINGS: There is been mildly increased erosion at the base of the first metatarsal, concerning for worsening osteomyelitis given the patient's symptoms. 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 Soft tissue swelling is noted about the forefoot. A soft tissue ulceration is again noted along the plantar aspect of the midfoot. Scattered vascular calcification is noted. The subtalar joint is unremarkable in appearance. IMPRESSION: 1. Mildly increased erosion at the base of the first metatarsal, concerning for worsening osteomyelitis given the patient's symptoms. 2. Underlying changes of Lisfranc injury and likely mild Charcot arthropathy again noted, with significant lateral and dorsal displacement of the bases of the second to fifth metatarsals. 3. Scattered vascular calcification noted. Electronically Signed   By: Garald Balding M.D.   On: 08/29/2018 17:39    Procedures Procedures (including critical care time)  Medications Ordered in ED Medications  isosorbide-hydrALAZINE (BIDIL) 20-37.5 MG per tablet 1 tablet (has no administration in time range)  piperacillin-tazobactam (ZOSYN) IVPB 3.375 g (3.375 g Intravenous New Bag/Given 08/29/18 2139)  vancomycin (VANCOCIN) IVPB 1000 mg/200 mL premix (has no administration in time range)  furosemide (LASIX) injection 60 mg (60 mg Intravenous Given 08/29/18 2141)     Initial Impression / Assessment and Plan / ED Course  I have reviewed the triage vital signs and the  nursing notes.  Pertinent labs & imaging results that were  available during my care of the patient were reviewed by me and considered in my medical decision making (see chart for details).  Clinical Course as of Aug 28 2145  Wed Aug 29, 2018  2112 Consult: Reviewed with Dr. Fara Chute covering for Dr. Sharol Given.  Will have Dr. Sharol Given see patient tomorrow.  Advises for wet-to-dry dressing changes and start empiric antibiotics while awaiting consult.   [MP]    Clinical Course User Index [MP] Charlesetta Shanks, MD      EMR review indicates patient was referred from heart failure clinic for admission for IV Lasix.  He is deemed to be severely volume overloaded.  Patient also has an active foot wound is being treated by Dr. Sharol Given.  Patient does not endorse more pain, fever or malaise.  Foot does have foul-smelling drainage.  X-ray indicates some possible advancement of osteomyelitis.  Reviewed with orthopedics, will start antibiotics and they will consult tomorrow.  Patient is alert and appropriate.  Mental status is clear.  He is in no distress.  Pleasant and interactive.  No respiratory distress at rest.  Admitted for further treatment.  Final Clinical Impressions(s) / ED Diagnoses   Final diagnoses:  Diabetic ulcer of left midfoot associated with type 2 diabetes mellitus, unspecified ulcer stage ([place])  Acute on chronic congestive heart failure, unspecified heart failure type [facility])    ED Discharge Orders    None       Charlesetta Shanks, MD 08/29/18 2149

## 2018-08-29 NOTE — Progress Notes (Signed)
Pharmacy Antibiotic Note  Joshua Massey is a 43 y.o. male admitted on 08/29/2018 with osteomyelitis.  Pharmacy has been consulted for cefepime and vancomycin dosing. Patient is currently afebrile, RR stable, HR wnl, WBC stable at 6.1, lactic acid 1.3. Of note patient has PMH of CKD and Scr on admit 3.6 (baseline ~3.2 - 3.6)  Plan: Vancomycin 2000 mg IV x 1, then 2000 mg IV q24h Cefepime 2g IV q12h  Obtain vancomycin peak and trough once at steady state Monitor Scr for further dose adjustments, CBC, and clinical progression F/u de-escalation plans, and LOT    Temp (24hrs), Avg:98.1 F (36.7 C), Min:98 F (36.7 C), Max:98.1 F (36.7 C)  Recent Labs  Lab 08/28/18 1624 08/29/18 1229 08/29/18 1738  WBC 4.8 6.1  --   CREATININE 3.64* 3.60*  --   LATICACIDVEN  --   --  1.3    Estimated Creatinine Clearance: 41.5 mL/min (A) (by C-G formula based on SCr of 3.6 mg/dL (H)).    No Known Allergies  Antimicrobials this admission: Vancomcyin 3/11 >>  Cefepime 3/11 >>  Metronidazole 3/11 >>  Zosyn 3/11 x 1  Dose adjustments this admission: N/A  Microbiology results: N/A  Thank you for allowing pharmacy to be a part of this patient's care.  Leron Croak, PharmD PGY1 Pharmacy Resident  Please check AMION for all [facility] Pharmacy phone numbers 08/29/2018 10:09 PM

## 2018-08-29 NOTE — ED Notes (Signed)
Attempted report, no answer at direct number. My number is [phone]

## 2018-08-29 NOTE — Progress Notes (Signed)
ID: Dr Linus Salmons  Cardiology: Dr Gwenlyn Found HF MD: Dr Aundra Dubin Orthopedic: Dr  Percell Miller   HPI: Joshua Massey is a 43 y.o. male with a history of DM, HTN, and chronic systolic heart failure (diagnosed in 2010 EF 30-35%).   Admitted 2/27 through 09/02/16 with marked volume overload. Diuresed with IV lasix and transitioned to torsemide 60 mg daily. Hospital course was complicated by septic left knee. Discharged on IV antibiotics.  Discharge weight was 305 pounds.   Admitted 6/10-6/20/19 with volume overload and AKI. Creatinine peaked at 5.5. Nephrology consulted. Diuresed 70 lbs with high dose IV lasix and metolazone with improvement. Transitioned to torsemide 60 mg daily. He had new atrial flutter. HF team consulted. He was started on amio taper and Eliquis with return to NSR. DC weight 289 lbs.  Admitted 12/21-12/26/19 with diabetic foot ulcer. Found to have bilateral gangrenous lesion on feet. Ortho consulted and he underwent I&D of left plantar foot abscess 12/22 with repeat I&D with wound vac placement 12/24. Wound vac removed prior to DC.   Underwent debridement of foot on 07/04/18. DC weight 286 lbs.   Seen in ED yesterday with BLE edema. CXR showed no CHF. BNP was not checked. Creatinine was 3.64 and K 3.9. He was sent home without any changes.   He presents today as an add on for volume overload. He was last seen in clinic 09/2016. Recommended follow up Echo for ICD consideration, but lost to follow up. Overall doing poorly. He says he got a lot of fluids during his last debridement and has been having a hard time. His weight is up 64 lbs since January. He saw his PCP 2 weeks ago and weight was up to 235 lbs. He is up to 250 lbs today. Good UOP with torsemide. He drinks a lot of fluid, eats a lot of ice, and eats high salt foods like pizza. He is very inactive and uses scooter. Denies SOB. He has edema into thighs and abdomen, which has gotten progressively worse over the last few weeks. No CP or  dizziness. He has a cough with clear sputum. No fever or chills. His PCP set him up with nephrology follow up. He does not have a fistula. He is not weighing regularly at home. Says he is taking all medications. Uses a scooter to get around.   Echo 11/28/2017 15-20%, Grade 2 DD, Mild MR, Severe LAE, RV severely dilated and reduced function, Mod RAE, Mild TR, PA peak pressure 42 mm Hg.  ECHO 07/2016 EF 25-30%.  ECHO 08/2012 EF 35-40% ECHO 02/15/12 EF 25%   02/16/12 RHC/LHC  RA - 28  RV - 61/29  PA - 63/18 (31)  FCO/CI - 4.28/1.59  TDCO/CI - 3.46/1.28  AO Sat - 94% (room air)  PA Sat - 49% (room air)  LVEDP 28  PVR = 0.8 [place]  Moderate tubular mid-LAD lesion, otherwise no significant obstructive CAD.   Review of systems complete and found to be negative unless listed in HPI.    Past Medical History:  Diagnosis Date  . 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 Chronic kidney disease   . Diabetes mellitus    adult-onset, type 2  . Exogenous obesity   . GERD (gastroesophageal reflux disease)   . Hypertension   . Left foot infection 06/21/2018  . Osteomyelitis of foot, left, acute ([place]) 07/04/2018    Current Outpatient Medications  Medication Sig Dispense Refill  . acetaminophen (TYLENOL)  500 MG tablet Take 1,000 mg by mouth every 6 (six) hours as needed for moderate pain or headache.    Marland Kitchen amiodarone (PACERONE) 200 MG tablet Take 1 tablet (200 mg total) by mouth 2 (two) times daily. 60 tablet 0  . apixaban (ELIQUIS) 5 MG TABS tablet Take 1 tablet (5 mg total) by mouth 2 (two) times daily. 60 tablet 1  . carvedilol (COREG) 25 MG tablet Take 1 tablet (25 mg total) by mouth 2 (two) times daily with a meal. 180 tablet 1  . Continuous Blood Gluc Receiver (FREESTYLE LIBRE 14 DAY READER) DEVI 1 Device by Does not apply route daily. 2 Device 5  . Continuous Blood Gluc Sensor (FREESTYLE LIBRE 14 DAY SENSOR) MISC 1 Device by Does not apply route daily. 2 each 5  . Ferrous  Sulfate (IRON) 325 (65 Fe) MG TABS Take 1 tablet (325 mg total) by mouth 2 (two) times daily with a meal. 60 tablet 0  . glucose blood (FREESTYLE TEST STRIPS) test strip Use as instructed 100 each 0  . glucose monitoring kit (FREESTYLE) monitoring kit 1 each by Does not apply route 4 (four) times daily - after meals and at bedtime. 1 month Diabetic Testing Supplies for QAC-QHS accuchecks. 1 each 1  . insulin aspart (NOVOLOG FLEXPEN) 100 UNIT/ML FlexPen Inject 0-15 Units into the skin 3 (three) times daily with meals. 0-15U/Subcu/TIDw/meals//CBG70-120:0U/CBG121-150:2U/CBG151-200:3U/CBG201-250:5U/CBG251-300:8U/CBG301-350:11U/CBG351-400:15U//CBG>400:Call MD 15 mL 5  . Insulin Glargine (LANTUS) 100 UNIT/ML Solostar Pen Inject 5 Units into the skin daily. 15 mL 3  . Insulin Pen Needle 32G X 8 MM MISC Use as directed 100 each 0  . isosorbide-hydrALAZINE (BIDIL) 20-37.5 MG tablet Take 1 tablet by mouth 3 (three) times daily. 270 tablet 1  . Lancets (FREESTYLE) lancets Use as instructed 100 each 0  . oxyCODONE-acetaminophen (PERCOCET/ROXICET) 5-325 MG tablet Take 1 tablet by mouth every 6 (six) hours as needed for moderate pain or severe pain. 28 tablet 0  . torsemide (DEMADEX) 20 MG tablet Take 3 tablets (60 mg total) by mouth daily. 270 tablet 0  . Vitamin D, Ergocalciferol, (DRISDOL) 1.25 MG (50000 UT) CAPS capsule Take 1 capsule (50,000 Units total) by mouth every 7 (seven) days. 12 capsule 1  . atorvastatin (LIPITOR) 10 MG tablet Take 1 tablet (10 mg total) by mouth daily with supper. (Patient not taking: Reported on 08/29/2018) 90 tablet 3   No current facility-administered medications for this encounter.      PHYSICAL EXAM: Vitals:   08/29/18 1159  BP: (!) 148/116  SpO2: 99%  Weight: (!) 158.9 kg (350 lb 6.4 oz)   Wt Readings from Last 3 Encounters:  08/29/18 (!) 158.9 kg (350 lb 6.4 oz)  08/28/18 136.1 kg (300 lb)  08/21/18 (!) 151.2 kg (333 lb 4 oz)    General: Obese. No resp  difficulty. In wheelchair.  HEENT: normal Neck: supple. JVP to jaw. Carotids 2+ bilat; no bruits. No thyromegaly or nodule noted. Cor: PMI laterally displaced. RRR, No M/G/R noted Lungs: clear, distant.  Abdomen: soft, non-tender, distended, no HSM. No bruits or masses. +BS. Edema into abdomen/flanks Extremities: no cyanosis, clubbing, rash, R and LLE tight 3-4+ edema.  LLE dressing CDI.  Neuro: alert & orientedx3, cranial nerves grossly intact. moves all 4 extremities w/o difficulty. Affect pleasant   EKG NSR 82 bpm. QTc 502 ms. Personally reviewed.   ASSESSMENT & PLAN:  1. A-flutter/Atach, paroxysmal. - CHA2DS2-VASc is 3. EKG today shows NSR  - Decrease amiodarone to 200 mg daily.  LFTs normal 05/2018. TSH very mildly elevated (5.6) 07/2018. Check again today with anasarca.  - Needs to get yearly eye exam while on amiodarone.  - Continue Eliquis 5 mg BID.   2. Acute on chronic biventricular CHF - Echo 11/28/2017 15-20%, Grade 2 DD, Mild MR, Severe LAE, RV severely dilated and reduced function, Mod RAE, Mild TR, PA peak pressure 42 mm Hg.  - Last LHC 01/2012 with moderate tubular mid-LAD lesion, otherwise no significant obstructive CAD. - Volume status massively overloaded on exam with R>L HF. He is up 64 lbs since January.  - He will need to be admitted for IV lasix. 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 Creatinine 3.6 yesterday. Will send him to ER for hospitalist to admit. Discussed with Dr Aundra Dubin.  - Would hold coreg with massive overload.  - Continue Bidil 1 tab TID - No ACE/ARB/ARNI or spiro with CKD V.  - Not candidate for VAD with biventricular failure and CKD V.  - Would need Heart Kidney/Transplant, but has long history of non-compliance, so not sure how well he could navigate this process, or if he would have adequate social support.  - Has had low EF for a long time now. He likely needs ICD, but would not be a candidate until wounds are healed.  - He needs a repeat echo.   3. CKD  V - Creatinine 3.64 yesterday. Baseline all over the place 3.2-4.7 over the last few months - He has repeatedly refused AVF placement.  - He has not followed up with nephrology in some time. His PCP made an appointment for him soon. He is likely nearing need for HD. We talked about this today.   4. HTN - Elevated in setting of massive volume overload.   5. DM2  - Per PCP.   6. Morbid Obesity - Body mass index is 48.87 kg/m. - Encouraged weight loss.   7. Left foot wound - S/p I&D in January. Followed by Dr Sharol Given   He is massively volume overloaded with R>L HF. He will need to be admitted for IV lasix. Spoke with Dr Aundra Dubin and will send him to ED for hospitalist to admit with AKI on CKD V and . Will likely need nephrology consult. Drew labs prior to sending him to ER: BMET, TSH, CBC, BNP.   Georgiana Shore, NP  12:15 PM   Greater than 50% of the 25 minute visit was spent in counseling/coordination of care regarding disease state education, salt/fluid restriction, sliding scale diuretics, and medication compliance.

## 2018-08-29 NOTE — ED Notes (Signed)
Pt okay to transport to unit per Regino Schultze

## 2018-08-29 NOTE — Progress Notes (Signed)
ANTICOAGULATION CONSULT NOTE - Initial Consult  Pharmacy Consult for heparin Indication: atrial fibrillation  No Known Allergies  Patient Measurements: Ht: 5'11 Wt: 136 kg Heparin Dosing Weight: 106.7 kg  Vital Signs: Temp: 98 F (36.7 C) (03/11 2014) Temp Source: Oral (03/11 1257) BP: 150/110 (03/11 2014) Pulse Rate: 90 (03/11 2014)  Labs: Recent Labs    08/28/18 1624 08/29/18 1229  HGB 11.3* 11.5*  HCT 36.8* 38.0*  PLT 262 270  CREATININE 3.64* 3.60*    Estimated Creatinine Clearance: 41.5 mL/min (A) (by C-G formula based on SCr of 3.6 mg/dL (H)).   Medical History: Past Medical History:  Diagnosis Date  . 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 Chronic kidney disease   . Diabetes mellitus    adult-onset, type 2  . Exogenous obesity   . GERD (gastroesophageal reflux disease)   . Hypertension   . Left foot infection 06/21/2018  . Osteomyelitis of foot, left, acute ([place]) 07/04/2018    Medications:  Scheduled:  . amiodarone  200 mg Oral BID  . [START ON 08/30/2018] carvedilol  25 mg Oral BID WC  . [START ON 08/30/2018] furosemide  60 mg Intravenous Q12H  . [START ON 08/30/2018] insulin aspart  0-9 Units Subcutaneous Q4H  . isosorbide-hydrALAZINE  1 tablet Oral TID  . sodium chloride flush  3 mL Intravenous Q12H    Assessment: 42 yom on apixaban PTA for hx of Afib - last dose on 3/11@0800 . Presented with leg swelling s/p his wound excision on the sole of his L foot. Plan to hold and transition to heparin for surgeon's evaluation.   Hgb 11.5, plt 270. No s/sx of bleeding. Scr is 3.6 - similar to baseline.   Given recent apixaban dosing, will monitor both aPTT and heparin level until correlate.   Goal of Therapy:  Heparin level 0.3-0.7 units/ml aPTT 66-102 seconds Monitor platelets by anticoagulation protocol: Yes   Plan:  Start heparin infusion at 1500 units/hr now (has been 12 hours since last apixaban dose) Check anti-Xa and  aPTT level in 6 hours and daily while on heparin Continue to monitor H&H and platelets  Antonietta Jewel, PharmD, BCCCP Clinical Pharmacist  Pager: [phone] Phone: [phone] 08/29/2018,10:06 PM

## 2018-08-29 NOTE — ED Notes (Addendum)
ED TO INPATIENT HANDOFF REPORT  ED Nurse Name and Phone #: Ian Bushman [phone]  S Name/Age/Gender Joshua Massey 43 y.o. male Room/Bed: H020C/H020C  Code Status FULL  Home/SNF/Other Discharge to home Patient oriented AV:[id number] x4 Is this baseline? Yes  Triage Complete: Triage complete  Chief Complaint Fluid overload  Triage Note Pt reports bilateral leg swelling after surgery 3 weeks ago. States he has tried elevating the legs but minimal relief. No distress noted. Reports hx of CHF but denies shortness of breath at present.    Allergies No Known Allergies  Level of Care/Admitting Diagnosis ED Disposition    ED Disposition Condition [facility] Area: [place] [100100]  Level of Care: Telemetry Cardiac [103]  I expect the patient will be discharged within 24 hours: No (not a candidate for 5C-Observation unit)  Diagnosis: Acute on chronic combined systolic and diastolic CHF (congestive heart failure) [facility]) [191478]  Admitting Physician: Vianne Bulls [[id number]]  Attending Physician: Vianne Bulls [[id number]]  PT Class (Do Not Modify): Observation [104]  PT Acc Code (Do Not Modify): Observation [10022]       B Medical/Surgery History Past Medical History:  Diagnosis Date  . 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 Chronic kidney disease   . Diabetes mellitus    adult-onset, type 2  . Exogenous obesity   . GERD (gastroesophageal reflux disease)   . Hypertension   . Left foot infection 06/21/2018  . Osteomyelitis of foot, left, acute ([place]) 07/04/2018   Past Surgical History:  Procedure Laterality Date  . ANKLE SURGERY  2003   plate and screws  . APPLICATION OF WOUND VAC Left 06/12/2018   Procedure: APPLICATION OF WOUND VAC;  Surgeon: Mcarthur Rossetti, MD;  Location: [facility];  Service: Orthopedics;  Laterality: Left;  . CARDIAC CATHETERIZATION  02/16/2012   50% LAD lesion in mid vessel,  otherwise no significant obstructive CAD  . HIP PINNING  1990   bilateral  . I&D EXTREMITY Left 06/10/2018   Procedure: IRRIGATION AND DEBRIDEMENT OF FOOT;  Surgeon: Mcarthur Rossetti, MD;  Location: [place];  Service: Orthopedics;  Laterality: Left;  . I&D EXTREMITY Left 06/12/2018   Procedure: REPEAT IRRIGATION AND DEBRIDEMENT LEFT FOOT;  Surgeon: Mcarthur Rossetti, MD;  Location: [place];  Service: Orthopedics;  Laterality: Left;  . I&D EXTREMITY Left 07/04/2018   Procedure: DEBRIDEMENT OF LEFT FOOT;  Surgeon: Newt Minion, MD;  Location: [facility];  Service: Orthopedics;  Laterality: Left;  . IR GENERIC HISTORICAL  09/01/2016   IR FLUORO GUIDE CV LINE RIGHT 09/01/2016 Marybelle Killings, MD [facility]INTER[facility]  . IR GENERIC HISTORICAL  09/01/2016   IR US GUIDE VASC ACCESS RIGHT 09/01/2016 Marybelle Killings, MD [facility]  . IR GENERIC HISTORICAL  09/16/2016   IR REMOVAL TUN CV CATH W/O FL 09/16/2016 Arne Cleveland, MD [facility]  . KNEE ARTHROSCOPY Left 08/30/2016   Procedure: ARTHROSCOPY KNEE;  Surgeon: Renette Butters, MD;  Location: [place];  Service: Orthopedics;  Laterality: Left;  . LEFT AND RIGHT HEART CATHETERIZATION WITH CORONARY ANGIOGRAM N/A 02/16/2012   Procedure: LEFT AND RIGHT HEART CATHETERIZATION WITH CORONARY ANGIOGRAM;  Surgeon: Pixie Casino, MD;  Location: [facility] CATH LAB;  Service: Cardiovascular;  Laterality: N/A;  . TRANSTHORACIC ECHOCARDIOGRAM  08/31/2012   EF 35-40%, no significant wall motion abnormalities, diastolic relaxation abnormality - no longer needed LifeVest; LV cavity size mod dilated with mod conc hypertrophy,  systolic function mod reduced; mild MR, LA mod dilated     A IV Location/Drains/Wounds Patient Lines/Drains/Airways Status   Active Line/Drains/Airways    Name:   Placement date:   Placement time:   Site:   Days:   Peripheral IV 08/29/18 Right Antecubital   08/29/18    1739    Antecubital   less than 1   Negative Pressure Wound Therapy Foot Left   07/04/18     1503    -   56   Incision (Closed) 06/10/18 Toe (Comment  which one) Right   06/10/18    1520     80   Incision (Closed) 06/12/18 Leg Left   06/12/18    0957     78   Incision (Closed) 07/04/18 Foot Left   07/04/18    1504     56   Wound / Incision (Open or Dehisced) 06/09/18 Diabetic ulcer Toe (Comment  which one) Right   06/09/18    1745    Toe (Comment  which one)   81          Intake/Output Last 24 hours No intake or output data in the 24 hours ending 08/29/18 2148  Labs/Imaging Results for orders placed or performed during the hospital encounter of 08/29/18 (from the past 48 hour(s))  Hepatic function panel     Status: Abnormal   Collection Time: 08/29/18 12:29 PM  Result Value Ref Range   Total Protein 6.6 6.5 - 8.1 g/dL   Albumin 2.6 (L) 3.5 - 5.0 g/dL   AST 21 15 - 41 U/L   ALT 16 0 - 44 U/L   Alkaline Phosphatase 87 38 - 126 U/L   Total Bilirubin 0.9 0.3 - 1.2 mg/dL   Bilirubin, Direct 0.3 (H) 0.0 - 0.2 mg/dL   Indirect Bilirubin 0.6 0.3 - 0.9 mg/dL    Comment: Performed at [facility], [address], [place], [place] [address]  Lactic acid, plasma     Status: None   Collection Time: 08/29/18  5:38 PM  Result Value Ref Range   Lactic Acid, Venous 1.3 0.5 - 1.9 mmol/L    Comment: Performed at [facility] [address] [place], [place] [address]   Dg Chest 2 View  Result Date: 08/28/2018 CLINICAL DATA:  43 year old male with lower extremity edema for 3 weeks. EXAM: CHEST - 2 VIEW COMPARISON:  11/27/2017 chest radiographs and earlier. FINDINGS: Chronic moderate to severe cardiomegaly. Cardiac silhouette appears stable since 2019. Normal cardiac size and mediastinal contours. Visualized tracheal air column is within normal limits. No pneumothorax or pleural effusion. Pulmonary vascularity appears stable without overt edema. No confluent opacity. No acute osseous abnormality identified. Paucity of bowel gas in the upper abdomen. IMPRESSION: Moderate to severe  cardiomegaly is stable since 2019 with no overt edema or acute cardiopulmonary abnormality. Electronically Signed   By: Genevie Ann M.D.   On: 08/28/2018 16:31   Dg Foot Complete Left  Result Date: 08/29/2018 CLINICAL DATA:  Acute onset of left foot swelling and erythema. EXAM: LEFT FOOT - COMPLETE 3+ VIEW COMPARISON:  Left foot radiographs performed 06/09/2018, and left foot MRI performed 06/10/2018 FINDINGS: There is been mildly increased erosion at the base of the first metatarsal, concerning for worsening osteomyelitis given the patient's symptoms. 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 Soft tissue  swelling is noted about the forefoot. A soft tissue ulceration is again noted along the plantar aspect of the midfoot. Scattered vascular calcification is noted. The subtalar joint is unremarkable in appearance. IMPRESSION: 1. Mildly increased erosion at the base of the first metatarsal, concerning for worsening osteomyelitis given the patient's symptoms. 2. Underlying changes of Lisfranc injury and likely mild Charcot arthropathy again noted, with significant lateral and dorsal displacement of the bases of the second to fifth metatarsals. 3. Scattered vascular calcification noted. Electronically Signed   By: Garald Balding M.D.   On: 08/29/2018 17:39    Pending Labs Unresulted Labs (From admission, onward)    Start     Ordered   08/29/18 1652  Lactic acid, plasma  Now then every 2 hours,   STAT     08/29/18 1651          Vitals/Pain Today's Vitals   08/29/18 1257 08/29/18 1615 08/29/18 1645 08/29/18 2014  BP: (!) 135/98  (!) 133/99 (!) 150/110  Pulse: 77 75  90  Resp: 18   18  Temp: 98.1 F (36.7 C)   98 F (36.7 C)  TempSrc: Oral     SpO2: 95% 97%  92%  PainSc:        Isolation Precautions No active  isolations  Medications Medications  isosorbide-hydrALAZINE (BIDIL) 20-37.5 MG per tablet 1 tablet (has no administration in time range)  piperacillin-tazobactam (ZOSYN) IVPB 3.375 g (3.375 g Intravenous New Bag/Given 08/29/18 2139)  vancomycin (VANCOCIN) IVPB 1000 mg/200 mL premix (has no administration in time range)  furosemide (LASIX) injection 60 mg (60 mg Intravenous Given 08/29/18 2141)    Mobility Low fall risk - uses scooter to get around at home  Focused Assessments +2 swelling to bilateral legs, chronic wound to L side, XRAY shows osteomylitis   R Recommendations: See Admitting Provider Note Pt to receive IV antibiotics, initial dose of zosyn already in process  Report given to:   Additional Notes:  Please review labs from yesterday, pt was seen in ED for same complaint and had labs done then.

## 2018-08-29 NOTE — ED Notes (Signed)
Spoke with lab. Adding on Hepatic Function Panel to blood already in lab.

## 2018-08-29 NOTE — ED Triage Notes (Addendum)
Pt reports bilateral leg swelling after surgery 3 weeks ago. States he has tried elevating the legs but minimal relief. No distress noted. Reports hx of CHF but denies shortness of breath at present.

## 2018-08-29 NOTE — Telephone Encounter (Signed)
Returned call to patient who states that he has had some leg surgery and while in the hospital they gave him too many fluids.  He states that they gave him IV lasix for his symptoms and that it worked.  He was discharged approximately 3 weeks ago. Since then he feels his legs and into his abdomin have swollen to the point he needs IV lasix again.  He feels SOB with activity.  He feels this is a symptom of his fluid retention. Pt states he went to the ER yesterday and they sent him home to follow up with his PCP. Pt has been in touch with the Heart Failure Clinic at [facility] and has an appointment now. He states he is just ready to leave. Pt encouraged to call back for appointment if advised by the clinic. Pt verbalized understanding of all instructions.  Reason for Disposition . SEVERE leg swelling (e.g., swelling extends above knee, entire leg is swollen, weeping fluid)  Answer Assessment - Initial Assessment Questions 1. ONSET: "When did the swelling start?" (e.g., minutes, hours, days)     3-4 weeks ago has had swelling 2. LOCATION: "What part of the leg is swollen?"  "Are both legs swollen or just one leg?"     Both  3. SEVERITY: "How bad is the swelling?" (e.g., localized; mild, moderate, severe)  - Localized - small area of swelling localized to one leg  - MILD pedal edema - swelling limited to foot and ankle, pitting edema < 1/4 inch (6 mm) deep, rest and elevation eliminate most or all swelling  - MODERATE edema - swelling of lower leg to knee, pitting edema > 1/4 inch (6 mm) deep, rest and elevation only partially reduce swelling  - SEVERE edema - swelling extends above knee, facial or hand swelling present      Extends above knees into abdomine 4. REDNESS: "Does the swelling look red or infected?"     No 5. PAIN: "Is the swelling painful to touch?" If so, ask: "How painful is it?"   (Scale 1-10; mild, moderate or severe)     None just tightness 6. FEVER: "Do you have a  fever?" If so, ask: "What is it, how was it measured, and when did it start?"      No 7. CAUSE: "What do you think is causing the leg swelling?"     Taking in too much fluid 8. MEDICAL HISTORY: "Do you have a history of heart failure, kidney disease, liver failure, or cancer?"     Heart failure 9. RECURRENT SYMPTOM: "Have you had leg swelling before?" If so, ask: "When was the last time?" "What happened that time?"     In the hospital 1 year ago 10. OTHER SYMPTOMS: "Do you have any other symptoms?" (e.g., chest pain, difficulty breathing)       No SOB with extra weight of fluid 11. PREGNANCY: "Is there any chance you are pregnant?" "When was your last menstrual period?"       N/A  Protocols used: LEG SWELLING AND EDEMA-A-AH

## 2018-08-29 NOTE — Telephone Encounter (Signed)
Pt called c/o edema and sob w/activity.  He states he had surgery recently and was given IV fluids at that time since then his wt has been increasing and his edema has been getting worse.  He states he went to ER yesterday but they did not do anything for him.  He is taking his Torsemide 60 mg daily.  Appt sch for today.

## 2018-08-29 NOTE — H&P (Signed)
History and Physical    Joshua Massey ZJI:[id number] DOB: [birth year] DOA: 08/29/2018  PCP: [name], [name], MD   Patient coming from: Home   Chief Complaint: SOB, increased leg swelling   HPI: Joshua Massey is a 43 y.o. 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  He was seen by his cardiologist today for evaluation of the symptoms, was found to be massively volume overloaded, and directed to the ED for evaluation and management of this.  He denies any chest pain, fevers, or chills.  ED Course: Upon arrival to the ED, patient is found to be afebrile, saturating adequately on room air, and with blood pressure 150/110.  EKG features sinus rhythm with LAD and QTC 502 ms.  Chest x-ray with stable marked cardiomegaly, but no acute finding.  Chemistry panel notable for creatinine of 3.60, similar to priors, as well as albumin of 2.6.  CBC features a mild stable normocytic anemia.  TSH is elevated to 5.91.  Lactic acid is normal.  BNP is elevated to 2102.  Radiographs of the left foot demonstrate increased erosion at the base of the first metatarsal concerning for worsening osteomyelitis. Patient was given 60 mg IV Lasix in the ED and orthopedic surgery was consulted by the ED physician, recommending empiric antibiotics.  Patient was given vancomycin and Zosyn as well in the emergency department, remained hemodynamically stable, and is not in any apparent respiratory distress, and will be observed for ongoing evaluation and management.  Review of Systems:  All other systems reviewed and apart from HPI, are  negative.  Past Medical History:  Diagnosis Date   CHF (congestive heart failure) (HCC)    nonischemic cardiopathy   Chronic kidney disease    Diabetes mellitus    adult-onset, type 2   Exogenous obesity    GERD (gastroesophageal reflux disease)    Hypertension    Left foot infection 06/21/2018   Osteomyelitis of foot, left, acute ([place]) 07/04/2018    Past Surgical History:  Procedure Laterality Date   ANKLE SURGERY  2003   plate and screws   APPLICATION OF WOUND VAC Left 06/12/2018   Procedure: APPLICATION OF WOUND VAC;  Surgeon: Mcarthur Rossetti, MD;  Location: [place];  Service: Orthopedics;  Laterality: Left;   CARDIAC CATHETERIZATION  02/16/2012   50% LAD lesion in mid vessel, otherwise no significant obstructive CAD   HIP PINNING  1990   bilateral   I&D EXTREMITY Left 06/10/2018   Procedure: IRRIGATION AND DEBRIDEMENT OF FOOT;  Surgeon: Mcarthur Rossetti, MD;  Location: [place];  Service: Orthopedics;  Laterality: Left;   I&D EXTREMITY Left 06/12/2018   Procedure: REPEAT IRRIGATION AND DEBRIDEMENT LEFT FOOT;  Surgeon: Mcarthur Rossetti, MD;  Location: [place];  Service: Orthopedics;  Laterality: Left;   I&D EXTREMITY Left 07/04/2018   Procedure: DEBRIDEMENT OF LEFT FOOT;  Surgeon: Newt Minion, MD;  Location: [place];  Service: Orthopedics;  Laterality: Left;   IR GENERIC HISTORICAL  09/01/2016   IR FLUORO GUIDE CV LINE RIGHT 09/01/2016 Marybelle Killings, MD [facility]   IR GENERIC HISTORICAL  09/01/2016   IR US GUIDE VASC ACCESS RIGHT 09/01/2016  Marybelle Killings, MD [facility]   IR GENERIC HISTORICAL  09/16/2016   IR REMOVAL TUN CV CATH W/O FL 09/16/2016 Arne Cleveland, MD [facility]   KNEE ARTHROSCOPY Left 08/30/2016   Procedure: ARTHROSCOPY KNEE;  Surgeon: Renette Butters, MD;  Location: [place];  Service: Orthopedics;  Laterality: Left;   LEFT AND RIGHT HEART CATHETERIZATION WITH CORONARY ANGIOGRAM N/A 02/16/2012   Procedure: LEFT AND RIGHT HEART  CATHETERIZATION WITH CORONARY ANGIOGRAM;  Surgeon: Pixie Casino, MD;  Location: [facility] CATH LAB;  Service: Cardiovascular;  Laterality: N/A;   TRANSTHORACIC ECHOCARDIOGRAM  08/31/2012   EF 35-40%, no significant wall motion abnormalities, diastolic relaxation abnormality - no longer needed LifeVest; LV cavity size mod dilated with mod conc hypertrophy, systolic function mod reduced; mild MR, LA mod dilated     reports that he has never smoked. He has never used smokeless tobacco. He reports that he does not drink alcohol or use drugs.  No Known Allergies  Family History  Problem Relation Age of Onset   Coronary artery disease Brother        CABG at 59, DM   Heart disease Brother 8       CAD   Diabetes Mother    Hypertension Mother    Hyperlipidemia Mother    Heart disease Mother    Diabetes Father    Hypertension Father      Prior to Admission medications   Medication Sig Start Date End Date Taking? Authorizing Provider  acetaminophen (TYLENOL) 500 MG tablet Take 1,000 mg by mouth every 6 (six) hours as needed for moderate pain or headache.   Yes [provider]  amiodarone (PACERONE) 200 MG tablet Take 1 tablet (200 mg total) by mouth 2 (two) times daily. 08/07/18  Yes [name], [name], MD  apixaban (ELIQUIS) 5 MG TABS tablet Take 1 tablet (5 mg total) by mouth 2 (two) times daily. 08/07/18  Yes [name], [name], MD  carvedilol (COREG) 25 MG tablet Take 1 tablet (25 mg total) by mouth 2 (two) times daily with a meal. 08/07/18  Yes [name], [name], MD  Ferrous Sulfate (IRON) 325 (65 Fe) MG TABS Take 1 tablet (325 mg total) by mouth 2 (two) times daily with a meal. 06/14/18  Yes Dessa Phi, DO  insulin aspart (NOVOLOG FLEXPEN) 100 UNIT/ML FlexPen Inject 0-15 Units into the skin 3 (three) times daily with meals. 0-15U/Subcu/TIDw/meals//CBG70-120:0U/CBG121-150:2U/CBG151-200:3U/CBG201-250:5U/CBG251-300:8U/CBG301-350:11U/CBG351-400:15U//CBG>400:Call MD 08/13/18  Yes [name],  [name], MD  Insulin Glargine (LANTUS) 100 UNIT/ML Solostar Pen Inject 5 Units into the skin daily. Patient taking differently: Inject 5 Units into the skin at bedtime.  08/10/18  Yes [name], [name], MD  isosorbide-hydrALAZINE (BIDIL) 20-37.5 MG tablet Take 1 tablet by mouth 3 (three) times daily. 08/07/18  Yes [name], [name], MD  oxyCODONE-acetaminophen (PERCOCET/ROXICET) 5-325 MG tablet Take 1 tablet by mouth every 6 (six) hours as needed for moderate pain or severe pain. 08/21/18  Yes Rayburn, Neta Mends, PA-C  torsemide (DEMADEX) 20 MG tablet Take 3 tablets (60 mg total) by mouth daily. 08/07/18  Yes [name], [name], MD  Vitamin D, Ergocalciferol, (DRISDOL) 1.25 MG (50000 UT) CAPS capsule Take 1 capsule (50,000 Units total) by mouth every 7 (seven) days. 08/10/18  Yes [name], [name], MD  atorvastatin (LIPITOR) 10 MG tablet Take 1 tablet (10 mg total) by mouth daily with supper. Patient not taking: Reported on 08/29/2018 08/07/18   [name], [name], MD  Continuous Blood Gluc Receiver (FREESTYLE LIBRE 14 DAY  READER) DEVI 1 Device by Does not apply route daily. 01/10/18   [name], [name], MD  Continuous Blood Gluc Sensor (FREESTYLE LIBRE 14 DAY SENSOR) MISC 1 Device by Does not apply route daily. 01/10/18   [name], [name], MD  glucose blood (FREESTYLE TEST STRIPS) test strip Use as instructed 09/02/16   Ghimire, Henreitta Leber, MD  glucose monitoring kit (FREESTYLE) monitoring kit 1 each by Does not apply route 4 (four) times daily - after meals and at bedtime. 1 month Diabetic Testing Supplies for QAC-QHS accuchecks. 09/02/16   Ghimire, Henreitta Leber, MD  Insulin Pen Needle 32G X 8 MM MISC Use as directed 09/02/16   Jonetta Osgood, MD  Lancets (FREESTYLE) lancets Use as instructed 09/02/16   Jonetta Osgood, MD    Physical Exam: Vitals:   08/29/18 1257 08/29/18 1615 08/29/18 1645 08/29/18 2014  BP: (!) 135/98  (!) 133/99 (!) 150/110  Pulse: 77 75  90  Resp: 18   18  Temp: 98.1 F (36.7 C)   98 F  (36.7 C)  TempSrc: Oral     SpO2: 95% 97%  92%    Constitutional: NAD, calm  Eyes: PERTLA, lids and conjunctivae normal ENMT: Mucous membranes are moist. Posterior pharynx clear of any exudate or lesions.   Neck: normal, supple, no masses, no thyromegaly Respiratory: clear to auscultation bilaterally, no wheezing, no crackles. Normal respiratory effort.   Cardiovascular: S1 & S2 heard, regular rate and rhythm. Marked pitting edema to bilateral LE's. Abdomen: No distension, no tenderness, soft. Bowel sounds normal.  Musculoskeletal: no clubbing / cyanosis. No joint deformity upper and lower extremities.    Skin: Folliculitis. Bilateral feet bandaged, neurovascularly intact. Warm, dry, well-perfused. Neurologic: CN 2-12 grossly intact. Sensation intact. Strength 5/5 in all 4 limbs.  Psychiatric: Alert and oriented x 3. Calm, cooperative.    Labs on Admission: I have personally reviewed following labs and imaging studies  CBC: Recent Labs  Lab 08/28/18 1624 08/29/18 1229  WBC 4.8 6.1  HGB 11.3* 11.5*  HCT 36.8* 38.0*  MCV 93.9 94.8  PLT 262 517   Basic Metabolic Panel: Recent Labs  Lab 08/28/18 1624 08/29/18 1229  NA 139 141  K 3.9 3.9  CL 109 110  CO2 21* 23  GLUCOSE 124* 154*  BUN 61* 62*  CREATININE 3.64* 3.60*  CALCIUM 7.9* 8.2*   GFR: Estimated Creatinine Clearance: 41.5 mL/min (A) (by C-G formula based on SCr of 3.6 mg/dL (H)). Liver Function Tests: Recent Labs  Lab 08/29/18 1229  AST 21  ALT 16  ALKPHOS 87  BILITOT 0.9  PROT 6.6  ALBUMIN 2.6*   No results for input(s): LIPASE, AMYLASE in the last 168 hours. No results for input(s): AMMONIA in the last 168 hours. Coagulation Profile: No results for input(s): INR, PROTIME in the last 168 hours. Cardiac Enzymes: No results for input(s): CKTOTAL, CKMB, CKMBINDEX, TROPONINI in the last 168 hours. BNP (last 3 results) No results for input(s): PROBNP in the last 8760 hours. HbA1C: No results for  input(s): HGBA1C in the last 72 hours. CBG: No results for input(s): GLUCAP in the last 168 hours. Lipid Profile: No results for input(s): CHOL, HDL, LDLCALC, TRIG, CHOLHDL, LDLDIRECT in the last 72 hours. Thyroid Function Tests: Recent Labs    08/29/18 1229  TSH 5.906*   Anemia Panel: No results for input(s): VITAMINB12, FOLATE, FERRITIN, TIBC, IRON, RETICCTPCT in the last 72 hours. Urine analysis:    Component Value Date/Time   COLORURINE  AMBER (A) 08/17/2016 0129   APPEARANCEUR HAZY (A) 08/17/2016 0129   LABSPEC 1.016 08/17/2016 0129   PHURINE 5.0 08/17/2016 0129   GLUCOSEU 50 (A) 08/17/2016 0129   HGBUR SMALL (A) 08/17/2016 0129   BILIRUBINUR NEGATIVE 08/17/2016 0129   KETONESUR NEGATIVE 08/17/2016 0129   PROTEINUR >=300 (A) 08/17/2016 0129   UROBILINOGEN 0.2 09/02/2009 0935   NITRITE NEGATIVE 08/17/2016 0129   LEUKOCYTESUR NEGATIVE 08/17/2016 0129   Sepsis Labs: @LABRCNTIP (procalcitonin:4,lacticidven:4) )No results found for this or any previous visit (from the past 240 hour(s)).   Radiological Exams on Admission: Dg Chest 2 View  Result Date: 08/28/2018 CLINICAL DATA:  43 year old male with lower extremity edema for 3 weeks. EXAM: CHEST - 2 VIEW COMPARISON:  11/27/2017 chest radiographs and earlier. FINDINGS: Chronic moderate to severe cardiomegaly. Cardiac silhouette appears stable since 2019. Normal cardiac size and mediastinal contours. Visualized tracheal air column is within normal limits. No pneumothorax or pleural effusion. Pulmonary vascularity appears stable without overt edema. No confluent opacity. No acute osseous abnormality identified. Paucity of bowel gas in the upper abdomen. IMPRESSION: Moderate to severe cardiomegaly is stable since 2019 with no overt edema or acute cardiopulmonary abnormality. Electronically Signed   By: Genevie Ann M.D.   On: 08/28/2018 16:31   Dg Foot Complete Left  Result Date: 08/29/2018 CLINICAL DATA:  Acute onset of left foot  swelling and erythema. EXAM: LEFT FOOT - COMPLETE 3+ VIEW COMPARISON:  Left foot radiographs performed 06/09/2018, and left foot MRI performed 06/10/2018 FINDINGS: There is been mildly increased erosion at the base of the first metatarsal, concerning for worsening osteomyelitis given the patient's symptoms. 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 Soft tissue swelling is noted about the forefoot. A soft tissue ulceration is again noted along the plantar aspect of the midfoot. Scattered vascular calcification is noted. The subtalar joint is unremarkable in appearance. IMPRESSION: 1. Mildly increased erosion at the base of the first metatarsal, concerning for worsening osteomyelitis given the patient's symptoms. 2. Underlying changes of Lisfranc injury and likely mild Charcot arthropathy again noted, with significant lateral and dorsal displacement of the bases of the second to fifth metatarsals. 3. Scattered vascular calcification noted. Electronically Signed   By: Garald Balding M.D.   On: 08/29/2018 17:39    EKG: Independently reviewed. Sinus rhythm, LAD, QTc 502 ms.   Assessment/Plan   1. Acute on chronic combined CHF  - Presents with increased peripheral edema, wt-gain, and SOB  - TTE from June 2019 with EF 15-20%, diffuse HK, grade 2 diastolic dysfunction, mild MR, and severe LAE  - Treated in ED with Lasix 60 mg IV  - Continue diuresis with Lasix 60 mg IV q12h, continue Coreg as tolerated, continue Bidil, follow daily wt and I/O's, update echo, monitor renal function and electrolytes    2. Diabetic foot infection with osteomyelitis   - Patient has chronic bilateral foot wounds and had debridement on the left in January  - He is afebrile and there is noleukocytosis, but radiographs concerning for worsening osteomyelitis involving 1st metatarsal  on left  - Orthopedic surgery consulted by ED physician and recommended empiric antibiotics  - Continue abx with vancomycin, cefepime, and Flagy    3. CKD IV  - SCr is 3.60 on admission, similar to priors  - Follow closely during diuresis, renally-dose medications    4. CAD  - No  anginal complaints  - Reports he was recently taken off of statin, will continue beta-blocker    5. Insulin-dependent DM  - A1c was 8.5% in December  - Check CBG's and continue SSI with Novolog    6. Hypertension  - BP elevated in ED, anticipate improvement with diuresis  - Continue Bidil and Coreg   7. Paroxysmal atrial fibrillation  - In sinus rhythm on admission  - CHADS-VASc is 4 (CAD, CHF, DM, HTN)  - Hold Eliquis pending eval by orthopedic surgery, use IV heparin for now, continue amiodarone    DVT prophylaxis: Eliquis prior to admission; IV heparin for now pending surgeon's evaluation  Code Status: Full  Family Communication: Discussed with patient  Consults called: Orthopedic surgery consulted by ED physician  Admission status: Observation     Vianne Bulls, MD [facility] Pager [phone]  If 7PM-7AM, please contact night-coverage [email or web address] Password [facility]  08/29/2018, 10:02 PM

## 2018-08-30 ENCOUNTER — Observation Stay (HOSPITAL_BASED_OUTPATIENT_CLINIC_OR_DEPARTMENT_OTHER): Payer: BLUE CROSS/BLUE SHIELD

## 2018-08-30 ENCOUNTER — Ambulatory Visit (INDEPENDENT_AMBULATORY_CARE_PROVIDER_SITE_OTHER): Payer: BLUE CROSS/BLUE SHIELD | Admitting: Physician Assistant

## 2018-08-30 DIAGNOSIS — E11621 Type 2 diabetes mellitus with foot ulcer: Secondary | ICD-10-CM | POA: Diagnosis not present

## 2018-08-30 DIAGNOSIS — I5043 Acute on chronic combined systolic (congestive) and diastolic (congestive) heart failure: Secondary | ICD-10-CM

## 2018-08-30 DIAGNOSIS — L97919 Non-pressure chronic ulcer of unspecified part of right lower leg with unspecified severity: Secondary | ICD-10-CM

## 2018-08-30 DIAGNOSIS — I87333 Chronic venous hypertension (idiopathic) with ulcer and inflammation of bilateral lower extremity: Secondary | ICD-10-CM | POA: Diagnosis not present

## 2018-08-30 DIAGNOSIS — L97429 Non-pressure chronic ulcer of left heel and midfoot with unspecified severity: Secondary | ICD-10-CM | POA: Diagnosis not present

## 2018-08-30 DIAGNOSIS — L97929 Non-pressure chronic ulcer of unspecified part of left lower leg with unspecified severity: Secondary | ICD-10-CM

## 2018-08-30 DIAGNOSIS — I361 Nonrheumatic tricuspid (valve) insufficiency: Secondary | ICD-10-CM | POA: Diagnosis not present

## 2018-08-30 LAB — BASIC METABOLIC PANEL
Anion gap: 10 (ref 5–15)
BUN: 59 mg/dL — ABNORMAL HIGH (ref 6–20)
CALCIUM: 8.1 mg/dL — AB (ref 8.9–10.3)
CO2: 22 mmol/L (ref 22–32)
Chloride: 111 mmol/L (ref 98–111)
Creatinine, Ser: 3.54 mg/dL — ABNORMAL HIGH (ref 0.61–1.24)
GFR calc non Af Amer: 20 mL/min — ABNORMAL LOW (ref >60)
GFR, EST AFRICAN AMERICAN: 23 mL/min — AB (ref >60)
Glucose, Bld: 150 mg/dL — ABNORMAL HIGH (ref 70–99)
Potassium: 3.7 mmol/L (ref 3.5–5.1)
Sodium: 143 mmol/L (ref 135–145)

## 2018-08-30 LAB — GLUCOSE, CAPILLARY
Glucose-Capillary: 121 mg/dL — ABNORMAL HIGH (ref 70–99)
Glucose-Capillary: 124 mg/dL — ABNORMAL HIGH (ref 70–99)
Glucose-Capillary: 130 mg/dL — ABNORMAL HIGH (ref 70–99)
Glucose-Capillary: 132 mg/dL — ABNORMAL HIGH (ref 70–99)
Glucose-Capillary: 133 mg/dL — ABNORMAL HIGH (ref 70–99)
Glucose-Capillary: 149 mg/dL — ABNORMAL HIGH (ref 70–99)

## 2018-08-30 LAB — CBC
HCT: 37.8 % — ABNORMAL LOW (ref 39.0–52.0)
Hemoglobin: 11.3 g/dL — ABNORMAL LOW (ref 13.0–17.0)
MCH: 27.7 pg (ref 26.0–34.0)
MCHC: 29.9 g/dL — AB (ref 30.0–36.0)
MCV: 92.6 fL (ref 80.0–100.0)
Platelets: 251 10*3/uL (ref 150–400)
RBC: 4.08 MIL/uL — ABNORMAL LOW (ref 4.22–5.81)
RDW: 18.5 % — ABNORMAL HIGH (ref 11.5–15.5)
WBC: 4.6 10*3/uL (ref 4.0–10.5)
nRBC: 0 % (ref 0.0–0.2)

## 2018-08-30 LAB — SEDIMENTATION RATE: Sed Rate: 5 mm/hr (ref 0–16)

## 2018-08-30 LAB — T4, FREE: Free T4: 1.32 ng/dL (ref 0.82–1.77)

## 2018-08-30 LAB — ECHOCARDIOGRAM COMPLETE
HEIGHTINCHES: 71 in
Weight: 5548.8 oz

## 2018-08-30 LAB — HEPARIN LEVEL (UNFRACTIONATED): Heparin Unfractionated: 1.66 IU/mL — ABNORMAL HIGH (ref 0.30–0.70)

## 2018-08-30 LAB — C-REACTIVE PROTEIN: CRP: <0.8 mg/dL (ref <1.0)

## 2018-08-30 LAB — APTT: aPTT: 44 seconds — ABNORMAL HIGH (ref 24–36)

## 2018-08-30 MED ORDER — APIXABAN 5 MG PO TABS
5.0000 mg | ORAL_TABLET | Freq: Two times a day (BID) | ORAL | Status: DC
  Administered 2018-08-30 – 2018-09-06 (×15): 5 mg via ORAL
  Filled 2018-08-30 (×16): qty 1

## 2018-08-30 MED ORDER — SILVER SULFADIAZINE 1 % EX CREA: TOPICAL_CREAM | Freq: Every day | CUTANEOUS | Status: DC

## 2018-08-30 MED ORDER — FUROSEMIDE 10 MG/ML IJ SOLN
120.0000 mg | Freq: Three times a day (TID) | INTRAVENOUS | Status: DC
  Administered 2018-08-30 – 2018-09-03 (×11): 120 mg via INTRAVENOUS
  Filled 2018-08-30: qty 10
  Filled 2018-08-30: qty 12
  Filled 2018-08-30 (×4): qty 10
  Filled 2018-08-30 (×4): qty 12
  Filled 2018-08-30 (×2): qty 10
  Filled 2018-08-30 (×2): qty 12

## 2018-08-30 MED ORDER — INSULIN ASPART 100 UNIT/ML ~~LOC~~ SOLN
0.0000 [IU] | Freq: Three times a day (TID) | SUBCUTANEOUS | Status: DC
  Administered 2018-08-30: 1 [IU] via SUBCUTANEOUS
  Administered 2018-08-31: 0 [IU] via SUBCUTANEOUS
  Administered 2018-08-31 – 2018-09-01 (×2): 1 [IU] via SUBCUTANEOUS
  Administered 2018-09-01: 2 [IU] via SUBCUTANEOUS
  Administered 2018-09-01 – 2018-09-02 (×2): 1 [IU] via SUBCUTANEOUS
  Administered 2018-09-02: 2 [IU] via SUBCUTANEOUS
  Administered 2018-09-03 – 2018-09-05 (×7): 1 [IU] via SUBCUTANEOUS
  Administered 2018-09-05: 2 [IU] via SUBCUTANEOUS
  Administered 2018-09-06 (×2): 1 [IU] via SUBCUTANEOUS

## 2018-08-30 MED ORDER — FUROSEMIDE 10 MG/ML IJ SOLN
100.0000 mg | Freq: Two times a day (BID) | INTRAVENOUS | Status: DC
  Administered 2018-08-30: 100 mg via INTRAVENOUS
  Filled 2018-08-30: qty 10

## 2018-08-30 MED ORDER — SILVER SULFADIAZINE 1 % EX CREA
TOPICAL_CREAM | Freq: Every day | CUTANEOUS | Status: DC
  Administered 2018-08-30 – 2018-09-05 (×7): via TOPICAL
  Administered 2018-09-06: 1 via TOPICAL
  Filled 2018-08-30: qty 85

## 2018-08-30 MED ORDER — INSULIN ASPART 100 UNIT/ML ~~LOC~~ SOLN: 0.0000 [IU] | Freq: Every day | SUBCUTANEOUS | Status: DC

## 2018-08-30 NOTE — Progress Notes (Signed)
Order changed to Renal diet with 1200 ml fluid restriction per MD Florene Glen, verbal order with read back given

## 2018-08-30 NOTE — Consult Note (Signed)
[name] Admit Date: 08/29/2018 08/30/2018 Joshua Massey Requesting Physician:  Janyce Llanos MD  Reason for Consult:  Joshua Massey, AoC CHF exacerbation HPI:  43 year old male admitted on 3/11 after presenting to the advanced heart failure clinic and found to have acute decompensated heart failure with massive lower extremity edema, greater than 65 pounds of weight gain, and exertional dyspnea.  He has a history of biventricular systolic heart failure with an LVEF of 20%.  Patient endorses clear dietary sodium indiscretion, eating fast food and restaurant food regularly.  He also has stage IV CKD with a creatinine around 3.5, presenting at his baseline.  He has been dismissed from our practice because of repeated failure to attend visits.  History of nephrotic proteinuria with UPC of 6.3 on 08/17/2016.  Presenting albumin of 2.6.  2 view chest x-ray at presentation with market enlargement of the cardiopericardial silhouette, stable without active pulmonary disease.  Not on ACE or ARB.  Renal ultrasound on 06/14/2018 with bilateral echogenic kidneys suggestive of chronic medical renal disease, normal in size, no hydronephrosis or structural issues present.  Admitted, given furosemide 60 mg IV, less than adequate urine output thus far.  Dose increased to 100 mg IV just given.    PMH further Incudes:  pAfib on apixaban  DM2, DPN, and chronic OM of feet, follows with ortho; seen earlier today by orthopedics, continue nonsurgical therapies.  Placed on vancomycin, cefepime, Flagyl at presentation.  HTN    Creat (mg/dL)  Date Value  12/20/2016 2.63 (H)  01/01/2013 1.04   Creatinine, Ser (mg/dL)  Date Value  08/30/2018 3.54 (H)  08/29/2018 3.60 (H)  08/28/2018 3.64 (H)  08/07/2018 3.25 (H)  07/04/2018 3.25 (H)  06/14/2018 4.73 (H)  06/13/2018 4.66 (H)  06/12/2018 4.55 (H)  06/11/2018 4.19 (H)  06/10/2018 4.28 (H)  ] I/Os: I/O last 3 completed shifts: In: 450.6 [P.O.:240;  I.V.:66; IV Piggyback:144.6] Out: 700 [Urine:700]   ROS NSAIDS: Denies use IV Contrast no exposure TMP/SMX no exposure Hypotension not present Balance of 12 systems is negative w/ exceptions as above  PMH  Past Medical History:  Diagnosis Date  . 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 Chronic kidney disease   . Diabetes mellitus    adult-onset, type 2  . Exogenous obesity   . GERD (gastroesophageal reflux disease)   . Hypertension   . Left foot infection 06/21/2018  . Osteomyelitis of foot, left, acute ([place]) 07/04/2018   PSH  Past Surgical History:  Procedure Laterality Date  . ANKLE SURGERY  2003   plate and screws  . APPLICATION OF WOUND VAC Left 06/12/2018   Procedure: APPLICATION OF WOUND VAC;  Surgeon: Mcarthur Rossetti, MD;  Location: [facility];  Service: Orthopedics;  Laterality: Left;  . CARDIAC CATHETERIZATION  02/16/2012   50% LAD lesion in mid vessel, otherwise no significant obstructive CAD  . HIP PINNING  1990   bilateral  . I&D EXTREMITY Left 06/10/2018   Procedure: IRRIGATION AND DEBRIDEMENT OF FOOT;  Surgeon: Mcarthur Rossetti, MD;  Location: [facility];  Service: Orthopedics;  Laterality: Left;  . I&D EXTREMITY Left 06/12/2018   Procedure: REPEAT IRRIGATION AND DEBRIDEMENT LEFT FOOT;  Surgeon: Mcarthur Rossetti, MD;  Location: [facility];  Service: Orthopedics;  Laterality: Left;  . I&D EXTREMITY Left 07/04/2018   Procedure: DEBRIDEMENT OF LEFT FOOT;  Surgeon: Newt Minion, MD;  Location: [facility];  Service: Orthopedics;  Laterality: Left;  . IR  GENERIC HISTORICAL  09/01/2016   IR FLUORO GUIDE CV LINE RIGHT 09/01/2016 Marybelle Killings, MD [facility]  . IR GENERIC HISTORICAL  09/01/2016   IR US GUIDE VASC ACCESS RIGHT 09/01/2016 Marybelle Killings, MD [facility]  . IR GENERIC HISTORICAL  09/16/2016   IR REMOVAL TUN CV CATH W/O FL 09/16/2016 Arne Cleveland, MD [facility]  . KNEE ARTHROSCOPY Left 08/30/2016   Procedure: ARTHROSCOPY KNEE;   Surgeon: Renette Butters, MD;  Location: [place];  Service: Orthopedics;  Laterality: Left;  . LEFT AND RIGHT HEART CATHETERIZATION WITH CORONARY ANGIOGRAM N/A 02/16/2012   Procedure: LEFT AND RIGHT HEART CATHETERIZATION WITH CORONARY ANGIOGRAM;  Surgeon: Pixie Casino, MD;  Location: [facility] CATH LAB;  Service: Cardiovascular;  Laterality: N/A;  . TRANSTHORACIC ECHOCARDIOGRAM  08/31/2012   EF 35-40%, no significant wall motion abnormalities, diastolic relaxation abnormality - no longer needed LifeVest; LV cavity size mod dilated with mod conc hypertrophy, systolic function mod reduced; mild MR, LA mod dilated   FH  Family History  Problem Relation Age of Onset  . Coronary artery disease Brother        CABG at 93, DM  . Heart disease Brother 34       CAD  . Diabetes Mother   . Hypertension Mother   . Hyperlipidemia Mother   . Heart disease Mother   . Diabetes Father   . Hypertension Father    SH  reports that he has never smoked. He has never used smokeless tobacco. He reports that he does not drink alcohol or use drugs. Allergies No Known Allergies Home medications Prior to Admission medications   Medication Sig Start Date End Date Taking? Authorizing Provider  acetaminophen (TYLENOL) 500 MG tablet Take 1,000 mg by mouth every 6 (six) hours as needed for moderate pain or headache.   Yes [provider]  amiodarone (PACERONE) 200 MG tablet Take 1 tablet (200 mg total) by mouth 2 (two) times daily. 08/07/18  Yes [name], [name], MD  apixaban (ELIQUIS) 5 MG TABS tablet Take 1 tablet (5 mg total) by mouth 2 (two) times daily. 08/07/18  Yes [name], [name], MD  carvedilol (COREG) 25 MG tablet Take 1 tablet (25 mg total) by mouth 2 (two) times daily with a meal. 08/07/18  Yes [name], [name], MD  Ferrous Sulfate (IRON) 325 (65 Fe) MG TABS Take 1 tablet (325 mg total) by mouth 2 (two) times daily with a meal. 06/14/18  Yes Dessa Phi, DO  insulin aspart (NOVOLOG FLEXPEN) 100 UNIT/ML  FlexPen Inject 0-15 Units into the skin 3 (three) times daily with meals. 0-15U/Subcu/TIDw/meals//CBG70-120:0U/CBG121-150:2U/CBG151-200:3U/CBG201-250:5U/CBG251-300:8U/CBG301-350:11U/CBG351-400:15U//CBG>400:Call MD 08/13/18  Yes [name], [name], MD  Insulin Glargine (LANTUS) 100 UNIT/ML Solostar Pen Inject 5 Units into the skin daily. Patient taking differently: Inject 5 Units into the skin at bedtime.  08/10/18  Yes [name], [name], MD  isosorbide-hydrALAZINE (BIDIL) 20-37.5 MG tablet Take 1 tablet by mouth 3 (three) times daily. 08/07/18  Yes [name], [name], MD  oxyCODONE-acetaminophen (PERCOCET/ROXICET) 5-325 MG tablet Take 1 tablet by mouth every 6 (six) hours as needed for moderate pain or severe pain. 08/21/18  Yes Rayburn, Neta Mends, PA-C  torsemide (DEMADEX) 20 MG tablet Take 3 tablets (60 mg total) by mouth daily. 08/07/18  Yes [name], [name], MD  Vitamin D, Ergocalciferol, (DRISDOL) 1.25 MG (50000 UT) CAPS capsule Take 1 capsule (50,000 Units total) by mouth every 7 (seven) days. 08/10/18  Yes [name], [name], MD  atorvastatin (LIPITOR) 10  MG tablet Take 1 tablet (10 mg total) by mouth daily with supper. Patient not taking: Reported on 08/29/2018 08/07/18   [name], [name], MD  Continuous Blood Gluc Receiver (FREESTYLE LIBRE 14 DAY READER) DEVI 1 Device by Does not apply route daily. 01/10/18   [name], [name], MD  Continuous Blood Gluc Sensor (FREESTYLE LIBRE 14 DAY SENSOR) MISC 1 Device by Does not apply route daily. 01/10/18   [name], [name], MD  glucose blood (FREESTYLE TEST STRIPS) test strip Use as instructed 09/02/16   Ghimire, Henreitta Leber, MD  glucose monitoring kit (FREESTYLE) monitoring kit 1 each by Does not apply route 4 (four) times daily - after meals and at bedtime. 1 month Diabetic Testing Supplies for QAC-QHS accuchecks. 09/02/16   Ghimire, Henreitta Leber, MD  Insulin Pen Needle 32G X 8 MM MISC Use as directed 09/02/16   Jonetta Osgood, MD  Lancets (FREESTYLE) lancets Use as  instructed 09/02/16   Jonetta Osgood, MD    Current Medications Scheduled Meds: . amiodarone  200 mg Oral BID  . apixaban  5 mg Oral BID  . carvedilol  25 mg Oral BID WC  . insulin aspart  0-5 Units Subcutaneous QHS  . insulin aspart  0-9 Units Subcutaneous TID WC  . isosorbide-hydrALAZINE  1 tablet Oral TID  . silver sulfADIAZINE   Topical Daily  . sodium chloride flush  3 mL Intravenous Q12H   Continuous Infusions: . sodium chloride 250 mL (08/30/18 1422)  . furosemide     PRN Meds:.sodium chloride, acetaminophen, oxyCODONE-acetaminophen, sodium chloride flush  CBC Recent Labs  Lab 08/28/18 1624 08/29/18 1229 08/30/18 0542  WBC 4.8 6.1 4.6  HGB 11.3* 11.5* 11.3*  HCT 36.8* 38.0* 37.8*  MCV 93.9 94.8 92.6  PLT 262 270 161   Basic Metabolic Panel Recent Labs  Lab 08/28/18 1624 08/29/18 1229 08/30/18 0542  NA 139 141 143  K 3.9 3.9 3.7  CL 109 110 111  CO2 21* 23 22  GLUCOSE 124* 154* 150*  BUN 61* 62* 59*  CREATININE 3.64* 3.60* 3.54*  CALCIUM 7.9* 8.2* 8.1*    Physical Exam  Blood pressure 107/82, pulse 100, temperature 98.2 F (36.8 C), temperature source Oral, resp. rate 18, height 5' 11"  (1.803 m), weight (!) 157.3 kg, SpO2 93 %. GEN: NAD, lying flat in the bed, chronically ill-appearing, morbidly obese PSYCH: Affect is dis-congruent with the severity of her comorbidities, he seems not to be taking it seriously ENT: NCAT EYES: EOMI CV: RRR, normal S1-S2, no rub, no murmur PULM: Clear throughout ABD: Massive obesity SKIN: Bilateral lower extremities wrapped EXT: 4+ edema proximal to the wrappings NEURO: Nonfocal  Assessment 68M with acute on chronic decompensated biventricular systolic heart failure, presenting with predominant right-sided symptoms; complicated by stage IV CKD with creatinine around 3.5-3.6, at baseline.  Also with chronic diabetic foot ulcers followed by orthopedics.  1. Slowly progressive CKD 4 driven by diabetic nephropathy  (has nephrotic proteinuria and evidence of peripheral neuropathy) and cardiorenal syndrome 2. Acute on chronic decompensated biventricular systolic CHF, massive hypervolemia, predominant right-sided symptoms 3. Bilateral lower extremity foot wounds and osteomyelitis followed by orthopedics related to aforementioned problems 4. Hypoalbuminemia 5. Atrial fibrillation on DOAC  Plan 1. Increase furosemide to 120 mg IV 3 times daily, if inadequate for significant negative fluid balance would go ahead and increase to 160 mg IV 2. Again discussed the gravity of the situation with cardiac, renal, infectious issues all present and rather fulminant; I  hope he comes around and sees the importance of his current circumstances 3. We need to try to connect him back into our office upon discharge, hopefully we will choose to follow-up 4. Will need strict dietary sodium restriction, will need to see nutrition prior to discharge 5. Daily weights, Daily Renal Panel, Strict I/Os, Avoid nephrotoxins (NSAIDs, judicious IV Contrast)   Pearson Grippe MD [phone] pgr 08/30/2018, 3:09 PM

## 2018-08-30 NOTE — Progress Notes (Signed)
ANTICOAGULATION CONSULT NOTE - Initial Consult  Pharmacy Consult for heparin Indication: atrial fibrillation  No Known Allergies  Patient Measurements: Ht: 5'11 Wt: 136 kg Heparin Dosing Weight: 106.7 kg  Vital Signs: Temp: 97.8 F (36.6 C) (03/12 0444) Temp Source: Oral (03/12 0444) BP: 137/93 (03/12 1011) Pulse Rate: 100 (03/12 1011)  Labs: Recent Labs    08/28/18 1624 08/29/18 1229 08/30/18 0542  HGB 11.3* 11.5* 11.3*  HCT 36.8* 38.0* 37.8*  PLT 262 270 251  APTT  --   --  44*  HEPARINUNFRC  --   --  1.66*  CREATININE 3.64* 3.60* 3.54*    Estimated Creatinine Clearance: 42 mL/min (A) (by C-G formula based on SCr of 3.54 mg/dL (H)).   Medical History: Past Medical History:  Diagnosis Date  . 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 Chronic kidney disease   . Diabetes mellitus    adult-onset, type 2  . Exogenous obesity   . GERD (gastroesophageal reflux disease)   . Hypertension   . Left foot infection 06/21/2018  . Osteomyelitis of foot, left, acute ([place]) 07/04/2018    Medications:  Scheduled:  . amiodarone  200 mg Oral BID  . apixaban  5 mg Oral BID  . carvedilol  25 mg Oral BID WC  . insulin aspart  0-9 Units Subcutaneous Q4H  . isosorbide-hydrALAZINE  1 tablet Oral TID  . silver sulfADIAZINE   Topical Daily  . sodium chloride flush  3 mL Intravenous Q12H    Assessment: 42 yom on apixaban PTA for hx of Afib - last dose on 3/11@0800 . Presented with leg swelling s/p his wound excision on the sole of his L foot. Plan to hold and transition to heparin for surgeon's evaluation.   Now to transition back to Eliquis.  Goal of Therapy:  Heparin level 0.3-0.7 units/ml aPTT 66-102 seconds Monitor platelets by anticoagulation protocol: Yes   Plan:  Stop heparin gtt Restart Eliquis 5mg  PO BID  Monitor CBC, s/s of bleed  Elenor Quinones, PharmD, BCPS, [facility] Clinical Pharmacist Phone number [phone] 08/30/2018 10:54 AM

## 2018-08-30 NOTE — Consult Note (Signed)
ORTHOPAEDIC CONSULTATION  REQUESTING PHYSICIAN: Elodia Florence., *  Chief Complaint: Congestive heart failure with swelling and ulceration left foot  HPI: Joshua Massey is a 43 y.o. male who presents with congestive heart failure with type 2 diabetes with increased swelling of both lower extremities and a healing Wagner grade 1 ulcer plantar aspect left foot Charcot rocker-bottom deformity.  Patient is status post irrigation and debridement of the ulcer left foot in January.  Past Medical History:  Diagnosis Date  . 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 Chronic kidney disease   . Diabetes mellitus    adult-onset, type 2  . Exogenous obesity   . GERD (gastroesophageal reflux disease)   . Hypertension   . Left foot infection 06/21/2018  . Osteomyelitis of foot, left, acute ([place]) 07/04/2018   Past Surgical History:  Procedure Laterality Date  . ANKLE SURGERY  2003   plate and screws  . APPLICATION OF WOUND VAC Left 06/12/2018   Procedure: APPLICATION OF WOUND VAC;  Surgeon: Mcarthur Rossetti, MD;  Location: [facility];  Service: Orthopedics;  Laterality: Left;  . CARDIAC CATHETERIZATION  02/16/2012   50% LAD lesion in mid vessel, otherwise no significant obstructive CAD  . HIP PINNING  1990   bilateral  . I&D EXTREMITY Left 06/10/2018   Procedure: IRRIGATION AND DEBRIDEMENT OF FOOT;  Surgeon: Mcarthur Rossetti, MD;  Location: [facility];  Service: Orthopedics;  Laterality: Left;  . I&D EXTREMITY Left 06/12/2018   Procedure: REPEAT IRRIGATION AND DEBRIDEMENT LEFT FOOT;  Surgeon: Mcarthur Rossetti, MD;  Location: [facility];  Service: Orthopedics;  Laterality: Left;  . I&D EXTREMITY Left 07/04/2018   Procedure: DEBRIDEMENT OF LEFT FOOT;  Surgeon: Newt Minion, MD;  Location: [place];  Service: Orthopedics;  Laterality: Left;  . IR GENERIC HISTORICAL  09/01/2016   IR FLUORO GUIDE CV LINE RIGHT 09/01/2016 Marybelle Killings, MD [facility]  . IR  GENERIC HISTORICAL  09/01/2016   IR US GUIDE VASC ACCESS RIGHT 09/01/2016 Marybelle Killings, MD [facility]  . IR GENERIC HISTORICAL  09/16/2016   IR REMOVAL TUN CV CATH W/O FL 09/16/2016 Arne Cleveland, MD [facility]  . KNEE ARTHROSCOPY Left 08/30/2016   Procedure: ARTHROSCOPY KNEE;  Surgeon: Renette Butters, MD;  Location: [facility];  Service: Orthopedics;  Laterality: Left;  . LEFT AND RIGHT HEART CATHETERIZATION WITH CORONARY ANGIOGRAM N/A 02/16/2012   Procedure: LEFT AND RIGHT HEART CATHETERIZATION WITH CORONARY ANGIOGRAM;  Surgeon: Pixie Casino, MD;  Location: [facility] CATH LAB;  Service: Cardiovascular;  Laterality: N/A;  . TRANSTHORACIC ECHOCARDIOGRAM  08/31/2012   EF 35-40%, no significant wall motion abnormalities, diastolic relaxation abnormality - no longer needed LifeVest; LV cavity size mod dilated with mod conc hypertrophy, systolic function mod reduced; mild MR, LA mod dilated   Social History   Socioeconomic History  . Marital status: Single    Spouse name: Not on file  . Number of children: 6  . Years of education: 36  . Highest education level: Not on file  Occupational History  . Occupation: Brewing technologist    Comment: [name]  . Occupation: newpaper delivery    Employer: [employer]  Social Needs  . Financial resource strain: Not on file  . Food insecurity:    Worry: Not on file    Inability: Not on file  . Transportation needs:    Medical: Not on file  Non-medical: Not on file  Tobacco Use  . Smoking status: Never Smoker  . Smokeless tobacco: Never Used  Substance and Sexual Activity  . Alcohol use: No  . Drug use: No  . Sexual activity: Yes    Birth control/protection: Condom  Lifestyle  . Physical activity:    Days per week: Not on file    Minutes per session: Not on file  . Stress: Not on file  Relationships  . Social connections:    Talks on phone: Not on file    Gets together: Not on file    Attends religious service: Not on file     Active member of club or organization: Not on file    Attends meetings of clubs or organizations: Not on file    Relationship status: Not on file  Other Topics Concern  . Not on file  Social History Narrative  . Not on file   Family History  Problem Relation Age of Onset  . Coronary artery disease Brother        CABG at 42, DM  . Heart disease Brother 38       CAD  . Diabetes Mother   . Hypertension Mother   . Hyperlipidemia Mother   . Heart disease Mother   . Diabetes Father   . Hypertension Father    - negative except otherwise stated in the family history section No Known Allergies Prior to Admission medications   Medication Sig Start Date End Date Taking? Authorizing Provider  acetaminophen (TYLENOL) 500 MG tablet Take 1,000 mg by mouth every 6 (six) hours as needed for moderate pain or headache.   Yes [provider]  amiodarone (PACERONE) 200 MG tablet Take 1 tablet (200 mg total) by mouth 2 (two) times daily. 08/07/18  Yes [name], [name], MD  apixaban (ELIQUIS) 5 MG TABS tablet Take 1 tablet (5 mg total) by mouth 2 (two) times daily. 08/07/18  Yes [name], [name], MD  carvedilol (COREG) 25 MG tablet Take 1 tablet (25 mg total) by mouth 2 (two) times daily with a meal. 08/07/18  Yes [name], [name], MD  Ferrous Sulfate (IRON) 325 (65 Fe) MG TABS Take 1 tablet (325 mg total) by mouth 2 (two) times daily with a meal. 06/14/18  Yes Dessa Phi, DO  insulin aspart (NOVOLOG FLEXPEN) 100 UNIT/ML FlexPen Inject 0-15 Units into the skin 3 (three) times daily with meals. 0-15U/Subcu/TIDw/meals//CBG70-120:0U/CBG121-150:2U/CBG151-200:3U/CBG201-250:5U/CBG251-300:8U/CBG301-350:11U/CBG351-400:15U//CBG>400:Call MD 08/13/18  Yes [name], [name], MD  Insulin Glargine (LANTUS) 100 UNIT/ML Solostar Pen Inject 5 Units into the skin daily. Patient taking differently: Inject 5 Units into the skin at bedtime.  08/10/18  Yes [name], [name], MD  isosorbide-hydrALAZINE (BIDIL) 20-37.5 MG tablet  Take 1 tablet by mouth 3 (three) times daily. 08/07/18  Yes [name], [name], MD  oxyCODONE-acetaminophen (PERCOCET/ROXICET) 5-325 MG tablet Take 1 tablet by mouth every 6 (six) hours as needed for moderate pain or severe pain. 08/21/18  Yes Rayburn, Neta Mends, PA-C  torsemide (DEMADEX) 20 MG tablet Take 3 tablets (60 mg total) by mouth daily. 08/07/18  Yes [name], [name], MD  Vitamin D, Ergocalciferol, (DRISDOL) 1.25 MG (50000 UT) CAPS capsule Take 1 capsule (50,000 Units total) by mouth every 7 (seven) days. 08/10/18  Yes [name], [name], MD  atorvastatin (LIPITOR) 10 MG tablet Take 1 tablet (10 mg total) by mouth daily with supper. Patient not taking: Reported on 08/29/2018 08/07/18   [name], [name], MD  Continuous Blood Gluc Receiver (FREESTYLE  LIBRE 14 DAY READER) DEVI 1 Device by Does not apply route daily. 01/10/18   [name], [name], MD  Continuous Blood Gluc Sensor (FREESTYLE LIBRE 14 DAY SENSOR) MISC 1 Device by Does not apply route daily. 01/10/18   [name], [name], MD  glucose blood (FREESTYLE TEST STRIPS) test strip Use as instructed 09/02/16   Ghimire, Henreitta Leber, MD  glucose monitoring kit (FREESTYLE) monitoring kit 1 each by Does not apply route 4 (four) times daily - after meals and at bedtime. 1 month Diabetic Testing Supplies for QAC-QHS accuchecks. 09/02/16   Ghimire, Henreitta Leber, MD  Insulin Pen Needle 32G X 8 MM MISC Use as directed 09/02/16   Jonetta Osgood, MD  Lancets (FREESTYLE) lancets Use as instructed 09/02/16   Jonetta Osgood, MD   Dg Chest 2 View  Result Date: 08/29/2018 CLINICAL DATA:  Acute CHF EXAM: CHEST - 2 VIEW COMPARISON:  08/28/2018 FINDINGS: Stable marked enlargement of the cardiopericardial silhouette. Dilated cardiomyopathy or pericardial effusion might account for this appearance. Low lung volumes with atelectasis at the left base. No overt pulmonary edema, effusion or pneumothorax. No suspicious osseous abnormalities. IMPRESSION: Marked enlargement of the  cardiopericardial silhouette, stable since prior without active pulmonary disease. Electronically Signed   By: Ashley Royalty M.D.   On: 08/29/2018 22:14   Dg Chest 2 View  Result Date: 08/28/2018 CLINICAL DATA:  43 year old male with lower extremity edema for 3 weeks. EXAM: CHEST - 2 VIEW COMPARISON:  11/27/2017 chest radiographs and earlier. FINDINGS: Chronic moderate to severe cardiomegaly. Cardiac silhouette appears stable since 2019. Normal cardiac size and mediastinal contours. Visualized tracheal air column is within normal limits. No pneumothorax or pleural effusion. Pulmonary vascularity appears stable without overt edema. No confluent opacity. No acute osseous abnormality identified. Paucity of bowel gas in the upper abdomen. IMPRESSION: Moderate to severe cardiomegaly is stable since 2019 with no overt edema or acute cardiopulmonary abnormality. Electronically Signed   By: Genevie Ann M.D.   On: 08/28/2018 16:31   Dg Foot Complete Left  Result Date: 08/29/2018 CLINICAL DATA:  Acute onset of left foot swelling and erythema. EXAM: LEFT FOOT - COMPLETE 3+ VIEW COMPARISON:  Left foot radiographs performed 06/09/2018, and left foot MRI performed 06/10/2018 FINDINGS: There is been mildly increased erosion at the base of the first metatarsal, concerning for worsening osteomyelitis given the patient's symptoms. 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 Soft tissue swelling is noted about the forefoot. A soft tissue ulceration is again noted along the plantar aspect of the midfoot. Scattered vascular calcification is noted. The subtalar joint is unremarkable in appearance. IMPRESSION: 1. Mildly increased erosion at the base of the first metatarsal, concerning for worsening osteomyelitis given the patient's symptoms. 2. Underlying changes of Lisfranc injury and  likely mild Charcot arthropathy again noted, with significant lateral and dorsal displacement of the bases of the second to fifth metatarsals. 3. Scattered vascular calcification noted. Electronically Signed   By: Garald Balding M.D.   On: 08/29/2018 17:39   - pertinent xrays, CT, MRI studies were reviewed and independently interpreted  Positive ROS: All other systems have been reviewed and were otherwise negative with the exception of those mentioned in the HPI and as above.  Physical Exam: General: Alert, no acute distress Psychiatric: Patient is competent for consent with normal mood and affect Lymphatic: No axillary or cervical  lymphadenopathy Cardiovascular: No pedal edema Respiratory: No cyanosis, no use of accessory musculature GI: No organomegaly, abdomen is soft and non-tender    Images:  @ENCIMAGES @  Labs:  Lab Results  Component Value Date   HGBA1C 8.5 (H) 06/10/2018   HGBA1C 11.5 (H) 11/28/2017   HGBA1C 9.7 (H) 12/20/2016   REPTSTATUS 06/15/2018 FINAL 06/10/2018   GRAMSTAIN  06/10/2018    RARE WBC PRESENT, PREDOMINANTLY PMN FEW GRAM POSITIVE COCCI IN PAIRS RARE GRAM NEGATIVE RODS    CULT  06/10/2018    FEW MORGANELLA MORGANII MODERATE BACTEROIDES THETAIOTAOMICRON BETA LACTAMASE POSITIVE Performed at [facility], [place] [address], [place], [place] [address]    [place] MORGANII 06/10/2018    Lab Results  Component Value Date   ALBUMIN 2.6 (L) 08/29/2018   ALBUMIN 1.7 (L) 06/10/2018   ALBUMIN 1.9 (L) 12/07/2017    Neurologic: Patient does not have protective sensation bilateral lower extremities.   MUSCULOSKELETAL:   Skin: Examination patient has significant swelling and pitting edema both lower extremities with superficial venous stasis ulcers of the right lower extremity.  The plantar ulcer left foot is stable with no abscess or purulent drainage.  Patient has pitting edema both lower extremities but no cellulitis.  Patient has severe  protein caloric malnutrition with uncontrolled type 2 diabetes.  Assessment: Assessment: Diabetic insensate neuropathy, severe protein caloric malnutrition, with congestive heart failure swelling of both lower extremities with venous ulcers on the right leg with a healing ulcer left foot with Charcot rocker-bottom deformity  Plan: Recommend compression for both legs patient will continue with the kneeling scooter and minimize weightbearing on the left foot.  I will follow-up in the office in 1 week.  Orders are written for Silvadene dressing changes for the left foot ulcer.  Thank you for the consult and the opportunity to see Joshua Massey, [place] [phone] 7:11 AM

## 2018-08-30 NOTE — Progress Notes (Signed)
  Echocardiogram 2D Echocardiogram has been performed.  Joshua Massey 08/30/2018, 10:45 AM

## 2018-08-30 NOTE — Progress Notes (Signed)
Orthopedic Tech Progress Note Patient Details:  Joshua Massey [birth year] [id number]  Ortho Devices Type of Ortho Device: Louretta Parma boot Ortho Device/Splint Interventions: Ordered, Application, Adjustment   Post Interventions Patient Tolerated: Well Instructions Provided: Adjustment of device, Care of device   Ranika Mcniel J Ashanna Heinsohn 08/30/2018, 12:01 PM

## 2018-08-30 NOTE — Consult Note (Signed)
[place] Nurse wound consult note Patient receiving care in [facility] 3E10.  No family present. Reason for Consult: BLE wounds Wound type: Charcot, DFU to left plantar foot followed by Dr. Sharol Given that already has silvadene orders for care provided by Dr. Sharol Given. Right great toe, plantar surface has a wound that measures 1.2 cm x 1.8 cm and is full thickness wound.  No induration, no odor, no erythema with this wound. The right LE has multiple scattered, partial thickness ulcerations associated with venous insufficiency and fluid overload.  The skin to the RLE is fragile, slightly erythematous and some peeling occurring. Dressing procedure/placement/frequency: Wash both lower legs and feet with warm water and soap.  Pat dry.  Apply liberal amounts of [place] Moisturizing oinmtnent (pink and white tube in clean utility) to BLEs, then follow wound orders for each leg. Right LE, ortho tech to wrap with [employer] twice weekly beginning 08/30/18.   Left LE, nurse to apply silvadene to left foot wound, cover with dry gauze. Spiral wrap kerlex from behind toes to below knee. Then spiral wrap ace wrap(s) over the kerlex from behind toes to below knee.  Perform daily. Of note, patient states he cannot reach his feet regardless of position, and has not been able to for somewhere in the neighborhood of 4 weeks.  He attributes this inability to the fluid in his abdomen and legs. Monitor the wound area(s) for worsening of condition such as: Signs/symptoms of infection,  Increase in size,  Development of or worsening of odor, Development of pain, or increased pain at the affected locations.  Notify the medical team if any of these develop.  Thank you for the consult.  Discussed plan of care with the patient and bedside nurse.  [place] nurse will not follow at this time.  Please re-consult the [place] team if needed.  Val Riles, RN, MSN, CWOCN, CNS-BC, pager [phone]

## 2018-08-30 NOTE — Progress Notes (Signed)
PROGRESS NOTE    Joshua Massey  FEO:[id number] DOB: March 29, 1976 DOA: 08/29/2018 PCP: [name], [name], MD   Brief Narrative:  Joshua Massey is [place] 43 y.o. 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  He was seen by his cardiologist today for evaluation of the symptoms, was found to be massively volume overloaded, and directed to the ED for evaluation and management of this.  He denies any chest pain, fevers, or chills.  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 Acute on chronic combined CHF  - Presents with increased peripheral edema, wt-gain, and SOB  - TTE from June 2019 with EF 15-20%, diffuse HK, grade 2 diastolic dysfunction, mild MR, and severe LAE  - Repeat echo pending - Treated in ED with Lasix 60 mg IV  - he takes 60 mg torsemide at home, will increase to 100 mg lasix BID - Continue coreg, bidil - I/O, daily weights (weight up about 20 kg since December, ? Since 3/10) - Per HF note, will consult nephrology given his CKD  Wt Readings from Last 3 Encounters:  08/30/18 (!) 157.3 kg  08/29/18 (!) 158.9 kg  08/28/18 136.1 kg   2. Diabetic foot infection with  osteomyelitis   - Patient has chronic bilateral foot wounds and had debridement on the left in January  - Afebrile, now white count - CRP and ESR wnl  - Plain films of L foot with concern for increased erosion at base of first metataral concerning for worsening osteomyelitis - Seen by Dr. Sharol Given today, recommending compression for both legs, continue kneeling scooter and follow up in office in 1 week.  Discussed antibiotics with Dr. Sharol Given and will plan on holding abx for now (though if hospitalization prolonged by number 1 may need to reevaluate this).   - wound care per dr. Sharol Given and wound care c/s  3. CKD IV  - SCr is 3.60 on admission, similar to priors (baseline between 3.25-4.73) - Follow closely during diuresis, renally-dose medications   - renal consult as noted above  4. CAD  - No anginal complaints  - Reports he was recently taken off of statin, will continue beta-blocker    5. Insulin-dependent DM  - A1c was 8.5% in December  - Check CBG's and continue SSI with Novolog    6. Hypertension  - BP elevated in ED, anticipate improvement with diuresis  - Continue Bidil and Coreg   7. Paroxysmal atrial fibrillation  - In sinus rhythm on admission  - CHADS-VASc is 4 (CAD, CHF, DM, HTN)  - Resume eliquis, continue amiodarone   # Abnormal Thyroid Function Tests:  Follow outpatient, follow T4  DVT prophylaxis: eliquis Code Status: full  Family Communication: none at bedside Disposition  Plan: pending evaluation by renal.  Volume overloaded, needs IV diuresis and renal evaluation prior to discharge.  Continues to require inpatient hospitalization.   Consultants:   nephrology  Procedures:   none  Antimicrobials: Anti-infectives (From admission, onward)   Start     Dose/Rate Route Frequency Ordered Stop   08/30/18 2200  vancomycin (VANCOCIN) 2,000 mg in sodium chloride 0.9 % 500 mL IVPB  Status:  Discontinued     2,000 mg 250 mL/hr over 120 Minutes Intravenous Every 24  hours 08/29/18 2223 08/30/18 1048   08/30/18 0500  ceFEPIme (MAXIPIME) 2 g in sodium chloride 0.9 % 100 mL IVPB  Status:  Discontinued     2 g 200 mL/hr over 30 Minutes Intravenous Every 12 hours 08/29/18 2212 08/30/18 1048   08/29/18 2215  metroNIDAZOLE (FLAGYL) IVPB 500 mg  Status:  Discontinued     500 mg 100 mL/hr over 60 Minutes Intravenous Every 8 hours 08/29/18 2201 08/30/18 1048   08/29/18 2215  vancomycin (VANCOCIN) 2,000 mg in sodium chloride 0.9 % 500 mL IVPB     2,000 mg 250 mL/hr over 120 Minutes Intravenous  Once 08/29/18 2208 08/30/18 0155   08/29/18 2215  ceFEPIme (MAXIPIME) 2 g in sodium chloride 0.9 % 100 mL IVPB  Status:  Discontinued     2 g 200 mL/hr over 30 Minutes Intravenous Every 12 hours 08/29/18 2211 08/29/18 2212   08/29/18 2115  piperacillin-tazobactam (ZOSYN) IVPB 3.375 g     3.375 g 100 mL/hr over 30 Minutes Intravenous  Once 08/29/18 2114 08/29/18 2209   08/29/18 2115  vancomycin (VANCOCIN) IVPB 1000 mg/200 mL premix  Status:  Discontinued     1,000 mg 200 mL/hr over 60 Minutes Intravenous  Once 08/29/18 2114 08/29/18 2208     Subjective: C?o weight gain. LE swelling, since surgery about 1 month ago.   Some SOB as well 290 at baseline, but up to 340's now.  Objective: Vitals:   08/29/18 2014 08/29/18 2332 08/30/18 0444 08/30/18 1011  BP: (!) 150/110 (!) 151/122 (!) 126/103 (!) 137/93  Pulse: 90 (!) 107 (!) 107 100  Resp: 18 18 18    Temp: 98 F (36.7 C) 97.6 F (36.4 C) 97.8 F (36.6 C)   TempSrc:  Oral Oral   SpO2: 92% 99% 99% 94%  Weight:  (!) 156.3 kg (!) 157.3 kg   Height:  5' 11"  (1.803 m)      Intake/Output Summary (Last 24 hours) at 08/30/2018 1328 Last data filed at 08/30/2018 0631 Gross per 24 hour  Intake 450.56 ml  Output 700 ml  Net -249.44 ml   Filed Weights   08/29/18 2332 08/30/18 0444  Weight: (!) 156.3 kg (!) 157.3 kg    Examination:  General exam: Appears calm and comfortable.  Obese.  Respiratory system: Clear  to auscultation. Respiratory effort normal. Cardiovascular system: S1 & S2 heard, RRR. Gastrointestinal system: Abdomen is nondistended, soft and nontender. Central nervous system: Alert and oriented. No focal neurological deficits. Extremities: bilateral lower extremity edema with lower extremity wounds.  Wound to plantar surface of L foot.  Psychiatry: Judgement and insight appear normal. Mood & affect appropriate.   Data Reviewed: I have personally reviewed following labs and imaging studies  CBC: Recent Labs  Lab 08/28/18 1624 08/29/18 1229 08/30/18 0542  WBC 4.8 6.1 4.6  HGB 11.3* 11.5* 11.3*  HCT 36.8* 38.0* 37.8*  MCV 93.9 94.8 92.6  PLT 262 270 347   Basic Metabolic Panel: Recent Labs  Lab 08/28/18 1624 08/29/18 1229 08/30/18 0542  NA 139 141 143  K 3.9 3.9 3.7  CL 109 110 111  CO2 21* 23 22  GLUCOSE 124* 154* 150*  BUN 61* 62* 59*  CREATININE 3.64* 3.60* 3.54*  CALCIUM 7.9* 8.2* 8.1*   GFR: Estimated Creatinine Clearance: 42 mL/min (Emberlee Sortino) (by C-G formula based on SCr of 3.54 mg/dL (H)). Liver Function Tests: Recent Labs  Lab 08/29/18 1229  AST 21  ALT 16  ALKPHOS 87  BILITOT 0.9  PROT 6.6  ALBUMIN 2.6*   No results for input(s): LIPASE, AMYLASE in the last 168 hours. No results for input(s): AMMONIA in the last 168 hours. Coagulation Profile: No results for input(s): INR, PROTIME in the last 168 hours. Cardiac Enzymes: No results for input(s): CKTOTAL, CKMB, CKMBINDEX, TROPONINI in the last 168 hours. BNP (last 3 results) No results for input(s): PROBNP in the last 8760 hours. HbA1C: No results for input(s): HGBA1C in the last 72 hours. CBG: Recent Labs  Lab 08/29/18 2359 08/30/18 0519 08/30/18 0828 08/30/18 1225  GLUCAP 132* 149* 130* 133*   Lipid Profile: No results for input(s): CHOL, HDL, LDLCALC, TRIG, CHOLHDL, LDLDIRECT in the last 72 hours. Thyroid Function Tests: Recent Labs    08/29/18 1229  TSH 5.906*   Anemia Panel: No  results for input(s): VITAMINB12, FOLATE, FERRITIN, TIBC, IRON, RETICCTPCT in the last 72 hours. Sepsis Labs: Recent Labs  Lab 08/29/18 1738  LATICACIDVEN 1.3    No results found for this or any previous visit (from the past 240 hour(s)).       Radiology Studies: Dg Chest 2 View  Result Date: 08/29/2018 CLINICAL DATA:  Acute CHF EXAM: CHEST - 2 VIEW COMPARISON:  08/28/2018 FINDINGS: Stable marked enlargement of the cardiopericardial silhouette. Dilated cardiomyopathy or pericardial effusion might account for this appearance. Low lung volumes with atelectasis at the left base. No overt pulmonary edema, effusion or pneumothorax. No suspicious osseous abnormalities. IMPRESSION: Marked enlargement of the cardiopericardial silhouette, stable since prior without active pulmonary disease. Electronically Signed   By: Ashley Royalty M.D.   On: 08/29/2018 22:14   Dg Chest 2 View  Result Date: 08/28/2018 CLINICAL DATA:  43 year old male with lower extremity edema for 3 weeks. EXAM: CHEST - 2 VIEW COMPARISON:  11/27/2017 chest radiographs and earlier. FINDINGS: Chronic moderate to severe cardiomegaly. Cardiac silhouette appears stable since 2019. Normal cardiac size and mediastinal contours. Visualized tracheal air column is within normal limits. No pneumothorax or pleural effusion. Pulmonary vascularity appears stable without overt edema. No confluent opacity. No acute osseous abnormality identified. Paucity of bowel gas in the upper abdomen. IMPRESSION: Moderate to severe cardiomegaly is stable since 2019 with no overt edema or acute cardiopulmonary abnormality. Electronically Signed   By: Genevie Ann M.D.   On: 08/28/2018 16:31   Dg Foot Complete Left  Result Date: 08/29/2018 CLINICAL DATA:  Acute onset of left foot swelling and erythema. EXAM: LEFT FOOT - COMPLETE 3+ VIEW COMPARISON:  Left foot radiographs performed 06/09/2018, and left foot MRI performed 06/10/2018 FINDINGS: There is been mildly increased  erosion at the base of the first metatarsal, concerning for worsening osteomyelitis given the patient's symptoms. 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 Soft tissue swelling is noted about the forefoot. Sunil Hue soft tissue ulceration is again noted along the plantar aspect of the midfoot. Scattered vascular calcification is  noted. The subtalar joint is unremarkable in appearance. IMPRESSION: 1. Mildly increased erosion at the base of the first metatarsal, concerning for worsening osteomyelitis given the patient's symptoms. 2. Underlying changes of Lisfranc injury and likely mild Charcot arthropathy again noted, with significant lateral and dorsal displacement of the bases of the second to fifth metatarsals. 3. Scattered vascular calcification noted. Electronically Signed   By: Garald Balding M.D.   On: 08/29/2018 17:39        Scheduled Meds: . amiodarone  200 mg Oral BID  . apixaban  5 mg Oral BID  . carvedilol  25 mg Oral BID WC  . insulin aspart  0-9 Units Subcutaneous Q4H  . isosorbide-hydrALAZINE  1 tablet Oral TID  . silver sulfADIAZINE   Topical Daily  . sodium chloride flush  3 mL Intravenous Q12H   Continuous Infusions: . sodium chloride    . furosemide       LOS: 0 days    Time spent: over 30 min    Fayrene Helper, MD [facility] Pager AMION  If 7PM-7AM, please contact night-coverage [email or web address] Password [email or web address] 08/30/2018, 1:28 PM

## 2018-08-30 NOTE — Progress Notes (Signed)
Unna boot placed, Left leg dressed per WOC and MD orders.

## 2018-08-30 NOTE — TOC Initial Note (Signed)
Transition of Care [facility]) - Initial/Assessment Note    Patient Details  Name: Joshua Massey MRN: [id number] Date of Birth: [birth year]  [facility] of [facility]) CM/SW Contact:    Sherrilyn Rist  Phone Number: [phone] 08/30/2018, 2:54 PM  Clinical Narrative:                 Patient lives at home; PCP: [name], [name], MD; has private insurance with BCBS with prescription drug coverage; CM following for progression of care.  Expected Discharge Plan: Home/Self Care     Patient Goals and CMS Choice        Expected Discharge Plan and Services Expected Discharge Plan: Home/Self Care                                Prior Living Arrangements/Services    Home with Girlfriend                   Activities of Daily Living Home Assistive Devices/Equipment: CBG Meter, Other (Comment)(scooter) ADL Screening (condition at time of admission) Patient's cognitive ability adequate to safely complete daily activities?: Yes Is the patient deaf or have difficulty hearing?: No Does the patient have difficulty seeing, even when wearing glasses/contacts?: No Does the patient have difficulty concentrating, remembering, or making decisions?: No Patient able to express need for assistance with ADLs?: Yes Does the patient have difficulty dressing or bathing?: No Independently performs ADLs?: Yes (appropriate for developmental age) Does the patient have difficulty walking or climbing stairs?: Yes Weakness of Legs: Left Weakness of Arms/Hands: None  Permission Sought/Granted                  Emotional Assessment              Admission diagnosis:  Acute CHF (congestive heart failure) ([place]) [I50.9] Diabetic ulcer of left midfoot associated with type 2 diabetes mellitus, unspecified ulcer stage ([place]) [V03.500, L97.429] Acute on chronic congestive heart failure, unspecified heart failure type ([place]) [I50.9] Patient Active Problem List   Diagnosis Date  Noted  . Acute on chronic combined systolic and diastolic CHF (congestive heart failure) ([place]) 08/29/2018  . S/P split thickness skin graft 08/02/2018  . Subacute osteomyelitis, left ankle and foot ([place])   . Non-compliant patient 12/18/2017  . 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 Hypovitaminosis D 11/27/2017  . Diabetic foot ulcer ([place]) 11/27/2017  . Cellulitis in diabetic foot ([place]) 11/27/2017  . Acute heart failure ([place]) 11/27/2017  . Volume overload 11/27/2017  . Hepatopathy 11/27/2017  . Hypokalemia 05/31/2017  . Diabetic peripheral neuropathy ([place]) 12/20/2016  . Hyperlipidemia associated with type 2 diabetes mellitus ([place]) 11/27/2016  . Shingles 10/11/2016  . Status post peripherally inserted central catheter (PICC) central line placement 09/12/2016  . Medication monitoring encounter 09/12/2016  . Arthritis, septic ([place])   . CKD (chronic kidney disease), stage IV ([place])   . AKI (acute kidney injury) ([place])   . Peripheral edema   . Acute renal failure ([place]) 08/17/2016  . Acute systolic CHF (congestive heart failure) ([place]) 08/17/2016  . Type 2 diabetes mellitus with vascular disease ([place]) 10/21/2013  . Chronic systolic heart failure ([place]) 02/27/2012  . Coronary artery disease, 50% LAD at cath 02/16/12 02/23/2012  . Family history of coronary artery disease 02/23/2012  . Cardiogenic shock ([place]) 02/17/2012  . NSVT (nonsustained ventricular tachycardia) ([place]) 02/16/2012  .  Obesity 02/16/2012  . Acute on chronic systolic CHF (congestive heart failure) ([place]) 02/16/2012  . CHF (congestive heart failure) ([place]) 02/15/2012  . Hypertension 02/15/2012  . Non-ischemic cardiomyopathy, EF < 20% 02/15/2012  . TINEA CRURIS 09/25/2009  . ALLERGIC RHINITIS CAUSE UNSPECIFIED 09/25/2009  . SCROTAL ABSCESS 09/17/2009  . Hypertensive heart disease with heart failure ([place]) 01/26/2009   PCP:  [name], [name], MD Pharmacy:   [place], [place] - [address] [place] [address] Phone: [phone] Fax: [phone]     Social Determinants of Health (SDOH) Interventions    Readmission Risk Interventions  No flowsheet data found.

## 2018-08-30 NOTE — Progress Notes (Signed)
   08/29/18 2332  Vitals  Temp 97.6 F (36.4 C)  Temp Source Oral  BP (!) 151/122  MAP (mmHg) 132  BP Location Right Arm  BP Method Automatic  Patient Position (if appropriate) Lying  Pulse Rate (!) 107  Pulse Rate Source Monitor  Resp 18  Oxygen Therapy  SpO2 99 %  O2 Device Room Air  Height and Weight  Height 5\' 11"  (1.803 m)  Weight (!) 156.3 kg (scale a)  Type of Scale Used Standing  BSA (Calculated - sq m) 2.8 sq meters  BMI (Calculated) 48.08  Weight in (lb) to have BMI = 25 178.9  MEWS Score  MEWS RR 0  MEWS Pulse 1  MEWS Systolic 0  MEWS LOC 0  MEWS Temp 0  MEWS Score 1  MEWS Score Color Green  Admitted pt to rm 3E10 from ED, pt alert and oriented, denied pain at this time, oriented to room, call bell placed within reach, placed on cardiac monitor, CCMD made aware.

## 2018-08-31 DIAGNOSIS — I5043 Acute on chronic combined systolic (congestive) and diastolic (congestive) heart failure: Secondary | ICD-10-CM | POA: Diagnosis not present

## 2018-08-31 DIAGNOSIS — K219 Gastro-esophageal reflux disease without esophagitis: Secondary | ICD-10-CM | POA: Diagnosis present

## 2018-08-31 DIAGNOSIS — Z8249 Family history of ischemic heart disease and other diseases of the circulatory system: Secondary | ICD-10-CM | POA: Diagnosis not present

## 2018-08-31 DIAGNOSIS — N2581 Secondary hyperparathyroidism of renal origin: Secondary | ICD-10-CM | POA: Diagnosis present

## 2018-08-31 DIAGNOSIS — Z6841 Body Mass Index (BMI) 40.0 and over, adult: Secondary | ICD-10-CM | POA: Diagnosis not present

## 2018-08-31 DIAGNOSIS — I509 Heart failure, unspecified: Secondary | ICD-10-CM

## 2018-08-31 DIAGNOSIS — D631 Anemia in chronic kidney disease: Secondary | ICD-10-CM | POA: Diagnosis present

## 2018-08-31 DIAGNOSIS — E11610 Type 2 diabetes mellitus with diabetic neuropathic arthropathy: Secondary | ICD-10-CM | POA: Diagnosis present

## 2018-08-31 DIAGNOSIS — E11621 Type 2 diabetes mellitus with foot ulcer: Secondary | ICD-10-CM | POA: Diagnosis not present

## 2018-08-31 DIAGNOSIS — E669 Obesity, unspecified: Secondary | ICD-10-CM | POA: Diagnosis present

## 2018-08-31 DIAGNOSIS — I5082 Biventricular heart failure: Secondary | ICD-10-CM | POA: Diagnosis present

## 2018-08-31 DIAGNOSIS — N184 Chronic kidney disease, stage 4 (severe): Secondary | ICD-10-CM | POA: Diagnosis not present

## 2018-08-31 DIAGNOSIS — E785 Hyperlipidemia, unspecified: Secondary | ICD-10-CM | POA: Diagnosis present

## 2018-08-31 DIAGNOSIS — I130 Hypertensive heart and chronic kidney disease with heart failure and stage 1 through stage 4 chronic kidney disease, or unspecified chronic kidney disease: Secondary | ICD-10-CM | POA: Diagnosis present

## 2018-08-31 DIAGNOSIS — I313 Pericardial effusion (noninflammatory): Secondary | ICD-10-CM | POA: Diagnosis present

## 2018-08-31 DIAGNOSIS — L97429 Non-pressure chronic ulcer of left heel and midfoot with unspecified severity: Secondary | ICD-10-CM | POA: Diagnosis not present

## 2018-08-31 DIAGNOSIS — E1122 Type 2 diabetes mellitus with diabetic chronic kidney disease: Secondary | ICD-10-CM | POA: Diagnosis present

## 2018-08-31 DIAGNOSIS — Z7901 Long term (current) use of anticoagulants: Secondary | ICD-10-CM | POA: Diagnosis not present

## 2018-08-31 DIAGNOSIS — Z8349 Family history of other endocrine, nutritional and metabolic diseases: Secondary | ICD-10-CM | POA: Diagnosis not present

## 2018-08-31 DIAGNOSIS — Z833 Family history of diabetes mellitus: Secondary | ICD-10-CM | POA: Diagnosis not present

## 2018-08-31 DIAGNOSIS — I2510 Atherosclerotic heart disease of native coronary artery without angina pectoris: Secondary | ICD-10-CM | POA: Diagnosis present

## 2018-08-31 DIAGNOSIS — I480 Paroxysmal atrial fibrillation: Secondary | ICD-10-CM | POA: Diagnosis present

## 2018-08-31 DIAGNOSIS — Z794 Long term (current) use of insulin: Secondary | ICD-10-CM | POA: Diagnosis not present

## 2018-08-31 DIAGNOSIS — E1169 Type 2 diabetes mellitus with other specified complication: Secondary | ICD-10-CM | POA: Diagnosis present

## 2018-08-31 DIAGNOSIS — M86272 Subacute osteomyelitis, left ankle and foot: Secondary | ICD-10-CM | POA: Diagnosis present

## 2018-08-31 LAB — CBC
HCT: 37.8 % — ABNORMAL LOW (ref 39.0–52.0)
Hemoglobin: 11.5 g/dL — ABNORMAL LOW (ref 13.0–17.0)
MCH: 28.1 pg (ref 26.0–34.0)
MCHC: 30.4 g/dL (ref 30.0–36.0)
MCV: 92.4 fL (ref 80.0–100.0)
Platelets: 253 10*3/uL (ref 150–400)
RBC: 4.09 MIL/uL — ABNORMAL LOW (ref 4.22–5.81)
RDW: 18.4 % — ABNORMAL HIGH (ref 11.5–15.5)
WBC: 5.1 10*3/uL (ref 4.0–10.5)
nRBC: 0 % (ref 0.0–0.2)

## 2018-08-31 LAB — BASIC METABOLIC PANEL
Anion gap: 11 (ref 5–15)
BUN: 59 mg/dL — AB (ref 6–20)
CO2: 23 mmol/L (ref 22–32)
Calcium: 8.4 mg/dL — ABNORMAL LOW (ref 8.9–10.3)
Chloride: 108 mmol/L (ref 98–111)
Creatinine, Ser: 3.74 mg/dL — ABNORMAL HIGH (ref 0.61–1.24)
GFR calc Af Amer: 22 mL/min — ABNORMAL LOW (ref >60)
GFR, EST NON AFRICAN AMERICAN: 19 mL/min — AB (ref >60)
GLUCOSE: 138 mg/dL — AB (ref 70–99)
Potassium: 3.5 mmol/L (ref 3.5–5.1)
Sodium: 142 mmol/L (ref 135–145)

## 2018-08-31 LAB — GLUCOSE, CAPILLARY
Glucose-Capillary: 142 mg/dL — ABNORMAL HIGH (ref 70–99)
Glucose-Capillary: 151 mg/dL — ABNORMAL HIGH (ref 70–99)
Glucose-Capillary: 154 mg/dL — ABNORMAL HIGH (ref 70–99)

## 2018-08-31 MED ORDER — ADULT MULTIVITAMIN W/MINERALS CH
1.0000 | ORAL_TABLET | Freq: Every day | ORAL | Status: DC
  Administered 2018-08-31 – 2018-09-06 (×7): 1 via ORAL
  Filled 2018-08-31 (×6): qty 1

## 2018-08-31 MED ORDER — PRO-STAT SUGAR FREE PO LIQD
30.0000 mL | Freq: Two times a day (BID) | ORAL | Status: DC
  Administered 2018-08-31 – 2018-09-06 (×11): 30 mL via ORAL
  Filled 2018-08-31 (×11): qty 30

## 2018-08-31 MED ORDER — METOLAZONE 5 MG PO TABS
10.0000 mg | ORAL_TABLET | Freq: Every day | ORAL | Status: DC
  Administered 2018-08-31 – 2018-09-01 (×2): 10 mg via ORAL
  Filled 2018-08-31 (×3): qty 2

## 2018-08-31 NOTE — Progress Notes (Signed)
PROGRESS NOTE    Joshua Massey  VPX:[id number] DOB: Jan 01, 1976 DOA: 08/29/2018 PCP: [name], [name], MD   Brief Narrative:  Joshua Massey is a 43 y.o. 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  He was seen by his cardiologist today for evaluation of the symptoms, was found to be massively volume overloaded, and directed to the ED for evaluation and management of this.  He denies any chest pain, fevers, or chills.  Assessment & Plan:   Principal Problem:   Acute on chronic combined systolic and diastolic CHF (congestive heart failure) (HCC) Active Problems:   Hypertension   Coronary artery disease, 50% LAD at cath 02/16/12   Type 2 diabetes mellitus with vascular disease ([place])   CKD (chronic kidney disease), stage IV (HCC)   Idiopathic chronic venous hypertension of both lower extremities with ulcer and inflammation (HCC)   Subacute osteomyelitis, left ankle and foot ([place])   Heart failure ([place])   1. Acute on chronic combined CHF  - Presents with increased peripheral edema, wt-gain, and SOB  - TTE from June 2019 with EF 15-20%, diffuse HK, grade 2 diastolic dysfunction, mild MR, and severe LAE  - Repeat echo EF 20-25%. Diastolic dysfunction.  Normal RV function.  See report. - Treated in ED with Lasix 60 mg IV  - he takes 60 mg torsemide at home, lasix increased to 120 TID per nephrology (only received 1 dose so far) - Continue coreg, bidil - I/O, daily weights (weight up about 20 kg since December, ? Since 3/10).  Weight going up here? 1.3 L out yesterday.   - Per HF note, will consult  nephrology given his CKD - Diuresis per renal - Check post void as he's complaining of feeling like he's not emptying bladder.  Wt Readings from Last 3 Encounters:  08/31/18 (!) 157.7 kg  08/29/18 (!) 158.9 kg  08/28/18 136.1 kg   2. Diabetic foot infection with osteomyelitis   - Patient has chronic bilateral foot wounds and had debridement on the left in January  - Afebrile, now white count - CRP and ESR wnl  - Plain films of L foot with concern for increased erosion at base of first metataral concerning for worsening osteomyelitis - Seen by Dr. Sharol Given 3/12, recommending compression for both legs, continue kneeling scooter and follow up in office in 1 week.  Discussed antibiotics with Dr. Sharol Given and will plan on holding abx for now (though if hospitalization prolonged by number 1 may need to reevaluate this).   - wound care per dr. Sharol Given and wound care c/s (planning for twice weekly unna boots to RLE, LLE silvadene to L foot wound - see instructions).  3. CKD IV  - SCr is 3.74 today, similar to priors (baseline between 3.25-4.73) - Follow closely during diuresis, renally-dose medications   - renal consult as noted above  4. CAD  - No anginal complaints  - Reports he was recently taken off of statin, will continue beta-blocker    5. Insulin-dependent DM  - A1c was 8.5% in December  - Check CBG's and continue SSI with Novolog    6. Hypertension  - BP elevated  in ED, anticipate improvement with diuresis  - Continue Bidil and Coreg   7. Paroxysmal atrial fibrillation  - In sinus rhythm on admission  - CHADS-VASc is 4 (CAD, CHF, DM, HTN)  - Resume eliquis, continue amiodarone   # Abnormal Thyroid Function Tests:  Follow outpatient, follow T4  DVT prophylaxis: eliquis Code Status: full  Family Communication: none at bedside Disposition Plan: pending evaluation by renal.  Volume overloaded, needs IV diuresis and renal evaluation prior to discharge.  Continues to require  inpatient hospitalization.   Consultants:   nephrology  Procedures:   none  Antimicrobials: Anti-infectives (From admission, onward)   Start     Dose/Rate Route Frequency Ordered Stop   08/30/18 2200  vancomycin (VANCOCIN) 2,000 mg in sodium chloride 0.9 % 500 mL IVPB  Status:  Discontinued     2,000 mg 250 mL/hr over 120 Minutes Intravenous Every 24 hours 08/29/18 2223 08/30/18 1048   08/30/18 0500  ceFEPIme (MAXIPIME) 2 g in sodium chloride 0.9 % 100 mL IVPB  Status:  Discontinued     2 g 200 mL/hr over 30 Minutes Intravenous Every 12 hours 08/29/18 2212 08/30/18 1048   08/29/18 2215  metroNIDAZOLE (FLAGYL) IVPB 500 mg  Status:  Discontinued     500 mg 100 mL/hr over 60 Minutes Intravenous Every 8 hours 08/29/18 2201 08/30/18 1048   08/29/18 2215  vancomycin (VANCOCIN) 2,000 mg in sodium chloride 0.9 % 500 mL IVPB     2,000 mg 250 mL/hr over 120 Minutes Intravenous  Once 08/29/18 2208 08/30/18 0155   08/29/18 2215  ceFEPIme (MAXIPIME) 2 g in sodium chloride 0.9 % 100 mL IVPB  Status:  Discontinued     2 g 200 mL/hr over 30 Minutes Intravenous Every 12 hours 08/29/18 2211 08/29/18 2212   08/29/18 2115  piperacillin-tazobactam (ZOSYN) IVPB 3.375 g     3.375 g 100 mL/hr over 30 Minutes Intravenous  Once 08/29/18 2114 08/29/18 2209   08/29/18 2115  vancomycin (VANCOCIN) IVPB 1000 mg/200 mL premix  Status:  Discontinued     1,000 mg 200 mL/hr over 60 Minutes Intravenous  Once 08/29/18 2114 08/29/18 2208     Subjective: Not peeing a lot. Feels like having issues emptying his bladder.  Objective: Vitals:   08/30/18 1810 08/30/18 2017 08/31/18 0009 08/31/18 0527  BP: (!) 123/102 (!) 121/91 121/87 (!) 141/101  Pulse: (!) 108 (!) 104 (!) 104 (!) 103  Resp: 20 18 18 18  Temp: (!) 97.5 F (36.4 C) 98.3 F (36.8 C) 97.9 F (36.6 C) (!) 97.5 F (36.4 C)  TempSrc: Oral Oral Oral Oral  SpO2: 100% 97% 97% 96%  Weight:    (!) 157.7 kg  Height:        Intake/Output Summary  (Last 24 hours) at 08/31/2018 0856 Last data filed at 08/31/2018 0529 Gross per 24 hour  Intake 600 ml  Output 1301 ml  Net -701 ml   Filed Weights   08/29/18 2332 08/30/18 0444 08/31/18 0527  Weight: (!) 156.3 kg (!) 157.3 kg (!) 157.7 kg    Examination:  General: No acute distress. Cardiovascular: Heart sounds show a regular rate, and rhythm. Lungs: Clear to auscultation bilaterally  Abdomen: Soft, nontender, nondistended  Neurological: Alert and oriented 3. Moves all extremities 4. Cranial nerves II through XII grossly intact. Skin: Warm and dry. No rashes or lesions. Extremities: RLE with unna boot.  LEE wrapped with intact dressing. Psychiatric: Mood and affect are normal. Insight and judgment are   appropriate.   Data Reviewed: I have personally reviewed following labs and imaging studies  CBC: Recent Labs  Lab 08/28/18 1624 08/29/18 1229 08/30/18 0542 08/31/18 0501  WBC 4.8 6.1 4.6 5.1  HGB 11.3* 11.5* 11.3* 11.5*  HCT 36.8* 38.0* 37.8* 37.8*  MCV 93.9 94.8 92.6 92.4  PLT 262 270 251 478   Basic Metabolic Panel: Recent Labs  Lab 08/28/18 1624 08/29/18 1229 08/30/18 0542 08/31/18 0501  NA 139 141 143 142  K 3.9 3.9 3.7 3.5  CL 109 110 111 108  CO2 21* _0 GLUCOSE 124* 154* 150* 138*  BUN 61* 62* 59* 59*  CREATININE 3.64* 3.60* 3.54* 3.74*  CALCIUM 7.9* 8.2* 8.1* 8.4*   GFR: Estimated Creatinine Clearance: 39.8 mL/min (A) (by C-G formula based on SCr of 3.74 mg/dL (H)). Liver Function Tests: Recent Labs  Lab 08/29/18 1229  AST 21  ALT 16  ALKPHOS 87  BILITOT 0.9  PROT 6.6  ALBUMIN 2.6*   No results for input(s): LIPASE, AMYLASE in the last 168 hours. No results for input(s): AMMONIA in the last 168 hours. Coagulation Profile: No results for input(s): INR, PROTIME in the last 168 hours. Cardiac Enzymes: No results for input(s): CKTOTAL, CKMB, CKMBINDEX, TROPONINI in the last 168 hours. BNP (last 3 results) No results for input(s):  PROBNP in the last 8760 hours. HbA1C: No results for input(s): HGBA1C in the last 72 hours. CBG: Recent Labs  Lab 08/30/18 0828 08/30/18 1225 08/30/18 1645 08/30/18 2147 08/31/18 0556  GLUCAP 130* 133* 124* 121* 142*   Lipid Profile: No results for input(s): CHOL, HDL, LDLCALC, TRIG, CHOLHDL, LDLDIRECT in the last 72 hours. Thyroid Function Tests: Recent Labs    08/29/18 1229 08/30/18 0542  TSH 5.906*  --   FREET4  --  1.32   Anemia Panel: No results for input(s): VITAMINB12, FOLATE, FERRITIN, TIBC, IRON, RETICCTPCT in the last 72 hours. Sepsis Labs: Recent Labs  Lab 08/29/18 1738  LATICACIDVEN 1.3    No results found for this or any previous visit (from the past 240 hour(s)).       Radiology Studies: Dg Chest 2 View  Result Date: 08/29/2018 CLINICAL DATA:  Acute CHF EXAM: CHEST - 2 VIEW COMPARISON:  08/28/2018 FINDINGS: Stable marked enlargement of the cardiopericardial silhouette. Dilated cardiomyopathy or pericardial effusion might account for this appearance. Low lung volumes with atelectasis at the left base. No overt pulmonary edema, effusion or pneumothorax. No suspicious osseous abnormalities. IMPRESSION: Marked enlargement of the cardiopericardial silhouette, stable since prior without active pulmonary disease. Electronically Signed   By: Ashley Royalty M.D.   On: 08/29/2018 22:14   Dg Foot Complete Left  Result Date: 08/29/2018 CLINICAL DATA:  Acute onset of left foot swelling and erythema. EXAM: LEFT FOOT - COMPLETE 3+ VIEW COMPARISON:  Left foot radiographs performed 06/09/2018, and left foot MRI performed 06/10/2018 FINDINGS: There is been mildly increased erosion at the base of the first metatarsal, concerning for worsening osteomyelitis given the patient's symptoms. 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  Soft tissue swelling is noted about the forefoot. A soft tissue ulceration is again noted along the plantar aspect of the midfoot. Scattered vascular calcification is noted. The subtalar joint is unremarkable in appearance. IMPRESSION: 1. Mildly increased erosion at the base of the first metatarsal, concerning for worsening osteomyelitis given the patient's symptoms. 2.  Underlying changes of Lisfranc injury and likely mild Charcot arthropathy again noted, with significant lateral and dorsal displacement of the bases of the second to fifth metatarsals. 3. Scattered vascular calcification noted. Electronically Signed   By: Garald Balding M.D.   On: 08/29/2018 17:39        Scheduled Meds: . amiodarone  200 mg Oral BID  . apixaban  5 mg Oral BID  . carvedilol  25 mg Oral BID WC  . insulin aspart  0-5 Units Subcutaneous QHS  . insulin aspart  0-9 Units Subcutaneous TID WC  . isosorbide-hydrALAZINE  1 tablet Oral TID  . silver sulfADIAZINE   Topical Daily  . sodium chloride flush  3 mL Intravenous Q12H   Continuous Infusions: . sodium chloride Stopped (08/30/18 1524)  . furosemide 120 mg (08/30/18 2244)     LOS: 0 days    Time spent: over 73 min    Fayrene Helper, MD [facility] Pager AMION  If 7PM-7AM, please contact night-coverage [email or web address] Password [facility] 08/31/2018, 8:56 AM

## 2018-08-31 NOTE — Progress Notes (Addendum)
Initial Nutrition Assessment  DOCUMENTATION CODES:   Morbid obesity  INTERVENTION:  30 ml Prostat BID, each supplement provides 100 kcal and 15 grams protein.  Liberalize diet from Renal diet to HH/CHO mod. With continued 1.2L fluid restrictions.    NUTRITION DIAGNOSIS:   Increased nutrient needs related to wound healing(DM ulcers on both L&R feet) as evidenced by estimated needs.   GOAL:   Patient will meet greater than or equal to 90% of their needs   MONITOR:   PO intake, Supplement acceptance, Diet advancement, Weight trends, Labs, I & O's  REASON FOR ASSESSMENT:   Consult Diet education(Will also conduct full assessment )  ASSESSMENT:   Pt is a 62y M with PMH significant for CKD stage IV, T2DM, HTN, chronic combined systolic and diastolic CHF with EF 01-75%, paroxysmal A. Fib (Eliquis) and DM foot wounds with osteomyelitis s/p debridement of the left foot in Jan 2020. Pt presenting to ED for increased leg swelling, weight gain, and SOB. Pt admitted for volume overloaded and Acute on Chronic combined systolic and diastolic CHF.   Pt states that his appetite has been good. Pt states that he doesn't eat a typical 3 meal a day meal pattern, but will he will snack on lots of fresh fruits throughout the day. Around lunch times he tends to snack on items like tuna (packets), strawberries, or blueberries. Around dinner time he eats higher protein items like baked chicken. Pt reports that he still eats some fast food like [employer] . Pt also drinks 2 Boost Glucose control daily to help with his DM and for extra protein for his wounds on his feet.   Pt doesn't endorse weight loss, rather gain of about 50#, suspected all water weight. Pt states that his UBW roughly 2 months ago was 290# and is currently 346.9#, at 56.9# weight gain. Pt states that he has a scale at home and tries to weigh himself daily. However with the recent ulcerations on foot, he cannot put weight on his left foot and  hasn't been able to weigh, but knew he was gaining weight.   Pt was mobile unaided at baseline. With condition of foot, he was aided with a manual knee scooter. Pt is on renal diet. Spoke with Dr. Florene Glen and received permission to liberalize diet to HH/CHO Mod. Diet With continued 1.2L fluid restriction.   Educated on Heart healthy, <2g/day Sodium and (DM HbA1c 8.5%) and high protein Education.   Handouts given to the Pt were provided from [facility] Nutrition Care Manual and included: "Low-Sodium Nutrition Therapy and Heart-Healthy Consistent Carbohydrate Nutrition Therapy."  Reviewed diet recall and determined where sodium was in his current diet. Points discussed from education was reducing sodium, such as rinsing canned vegetables when fresh wasn't an option. Items like tuna, a great source of protein, can be seasoned with herbs/spices for additional flavor. Also spoke on purchasing seasonings and where sodium can be in those. Also emphasized consistent carb intake and identified where our starchy and non-starchy vegetables in current diet. Teach back method used, pt was able to recall <2g a day sodium and 140mg  for low sodium, consistent meals for glucose leveling, and increased need for protein for wound healing of feet. Pt was agreeable to prostat 2x.    Pt expected compliance is good; pt wants to see his 2 young children grow up and wants to make changes.   Labs reviewed:  Glucose 128 (H)  Medications reviewed and include:  NovoLog 0-5 and 0-9 units  SSI  I/O: Net since Admit Mar 12-13: -950.72mL  NUTRITION - FOCUSED PHYSICAL EXAM:    Most Recent Value  Orbital Region  No depletion  Upper Arm Region  No depletion  Thoracic and Lumbar Region  No depletion  Buccal Region  No depletion  Temple Region  No depletion  Clavicle Bone Region  No depletion  Clavicle and Acromion Bone Region  No depletion  Scapular Bone Region  No depletion  Dorsal Hand  No depletion  Patellar Region  Unable  to assess [Legs are bandaged for wounds. ]  Anterior Thigh Region  Unable to assess  Posterior Calf Region  Unable to assess  Edema (RD Assessment)  Severe [40-50# of Fluid weight]  Hair  Reviewed  Eyes  Reviewed  Mouth  Reviewed  Skin  Reviewed  Nails  Reviewed       Diet Order:   Diet Order            Diet heart healthy/carb modified Room service appropriate? Yes; Fluid consistency: Thin; Fluid restriction: 1200 mL Fluid  Diet effective now              EDUCATION NEEDS:   Education needs have been addressed(See Ed. part of note for details)  Skin:  Skin Assessment: Skin Integrity Issues: Skin Integrity Issues:: Diabetic Ulcer Diabetic Ulcer: Diabetic ulcer Foot Left (Posterior); Toe (Right); Venous stasis ulcer R Leg.   Last BM:  3/12  Height:   Ht Readings from Last 1 Encounters:  08/29/18 5\' 11"  (1.803 m)    Weight:   Wt Readings from Last 1 Encounters:  08/31/18 (!) 157.7 kg    Ideal Body Weight:  78.2 kg  BMI:  Body mass index is 48.48 kg/m.  Estimated Nutritional Needs:   Kcal:  2200-2400  Protein:  115-130 grams  Fluid:  1.2L Restriction or MD recommendation    Herma Carson, Irene Dietetic Intern

## 2018-08-31 NOTE — Progress Notes (Signed)
Patient's blood pressure is elevated in the 511'M both systolic and diastolic, MD notified for orders  Neta Mends RN 1:03 PM 08-31-2018

## 2018-08-31 NOTE — Progress Notes (Signed)
[facility] ROUNDING NOTE   Subjective:   Brief history a 43 year old gentleman presenting with advanced heart failure which appears decompensated with massive lower extremity edema and a 65 weight pound weight gain.  Ejection fraction shows an EF of about 20%.  He has stage IV chronic kidney disease with increasing creatinine about 3.5.  His chronic no-shows [facility].  He has nephrotic range proteinuria with a protein creatinine ratio of 6.3.  He is not on an ACE inhibitor or ARB.  His renal ultrasound on 06/14/2018 showed bilateral echogenic kidneys suggestive of chronic medical disease.  He was admitted for IV diuresis.  Blood pressure 133 103 pulse 103 temperature 98.5 O2 sats 97% room air   urine output 1000 cc 08/30/2018 weight increase since admission from 156.3kg to 157.7 kg  Amiodarone 200 mg twice daily Coreg 25 mg twice daily Lasix 120 mg IV every 8 hours, BiDil 1 tablet 3 times daily insulin sliding scale, Eliquis 5 mg twice daily Silvadene to wound 1% cream  Sodium 142 potassium 3.5 chloride 108 CO2 23 BUN 59 creatinine 3.74 glucose 138 calcium 8.4 WBC 5.1 hemoglobin 11.5 platelets 253   Objective:  Vital signs in last 24 hours:  Temp:  [97.5 F (36.4 C)-98.5 F (36.9 C)] 98.5 F (36.9 C) (03/13 0901) Pulse Rate:  [100-111] 103 (03/13 0901) Resp:  [18-20] 20 (03/13 0901) BP: (107-141)/(82-103) 133/103 (03/13 0901) SpO2:  [93 %-100 %] 97 % (03/13 0901) Weight:  [157.7 kg] 157.7 kg (03/13 0527)  Weight change: 1.361 kg Filed Weights   08/29/18 2332 08/30/18 0444 08/31/18 0527  Weight: (!) 156.3 kg (!) 157.3 kg (!) 157.7 kg    Intake/Output: I/O last 3 completed shifts: In: 1050.6 [P.O.:840; I.V.:66; IV Piggyback:144.6] Out: 2001 [Urine:2000; Stool:1]   Intake/Output this shift:  No intake/output data recorded.  CVS- RRR systolic murmur 3/6 left sternal edge no rubs no gallops RS- CTA no wheezes no rales ABD- BS present soft  non-distended EXT-plus pitting edema with wraps to lower extremities   Basic Metabolic Panel: Recent Labs  Lab 08/28/18 1624 08/29/18 1229 08/30/18 0542 08/31/18 0501  NA 139 141 143 142  K 3.9 3.9 3.7 3.5  CL 109 110 111 108  CO2 21* 23 22 23   GLUCOSE 124* 154* 150* 138*  BUN 61* 62* 59* 59*  CREATININE 3.64* 3.60* 3.54* 3.74*  CALCIUM 7.9* 8.2* 8.1* 8.4*    Liver Function Tests: Recent Labs  Lab 08/29/18 1229  AST 21  ALT 16  ALKPHOS 87  BILITOT 0.9  PROT 6.6  ALBUMIN 2.6*   No results for input(s): LIPASE, AMYLASE in the last 168 hours. No results for input(s): AMMONIA in the last 168 hours.  CBC: Recent Labs  Lab 08/28/18 1624 08/29/18 1229 08/30/18 0542 08/31/18 0501  WBC 4.8 6.1 4.6 5.1  HGB 11.3* 11.5* 11.3* 11.5*  HCT 36.8* 38.0* 37.8* 37.8*  MCV 93.9 94.8 92.6 92.4  PLT 262 270 251 253    Cardiac Enzymes: No results for input(s): CKTOTAL, CKMB, CKMBINDEX, TROPONINI in the last 168 hours.  BNP: Invalid input(s): POCBNP  CBG: Recent Labs  Lab 08/30/18 0828 08/30/18 1225 08/30/18 1645 08/30/18 2147 08/31/18 0556  GLUCAP 130* 133* 124* 121* 142*    Microbiology: Results for orders placed or performed during the hospital encounter of 06/09/18  Surgical PCR screen     Status: Abnormal   Collection Time: 06/10/18  9:55 AM  Result Value Ref Range Status   MRSA, PCR  NEGATIVE NEGATIVE Final   Staphylococcus aureus POSITIVE (A) NEGATIVE Final    Comment: (NOTE) The Xpert SA Assay (FDA approved for NASAL specimens in patients 61 years of age and older), is one component of a comprehensive surveillance program. It is not intended to diagnose infection nor to guide or monitor treatment.   Aerobic/Anaerobic Culture (surgical/deep wound)     Status: None   Collection Time: 06/10/18  2:15 PM  Result Value Ref Range Status   Specimen Description ABSCESS LEFT,FOOT  Final   Special Requests NONE  Final   Gram Stain   Final    RARE WBC PRESENT,  PREDOMINANTLY PMN FEW GRAM POSITIVE COCCI IN PAIRS RARE GRAM NEGATIVE RODS    Culture   Final    FEW MORGANELLA MORGANII MODERATE BACTEROIDES THETAIOTAOMICRON BETA LACTAMASE POSITIVE Performed at [facility], [place] [address] [place], [place] [address]    Report Status 06/15/2018 FINAL  Final   Organism ID, Bacteria MORGANELLA MORGANII  Final      Susceptibility   Morganella morganii - MIC*    AMPICILLIN >=32 RESISTANT Resistant     CEFAZOLIN >=64 RESISTANT Resistant     CEFEPIME <=1 SENSITIVE Sensitive     CEFTAZIDIME <=1 SENSITIVE Sensitive     CEFTRIAXONE <=1 SENSITIVE Sensitive     CIPROFLOXACIN <=0.25 SENSITIVE Sensitive     GENTAMICIN <=1 SENSITIVE Sensitive     IMIPENEM 1 SENSITIVE Sensitive     TRIMETH/SULFA >=320 RESISTANT Resistant     AMPICILLIN/SULBACTAM >=32 RESISTANT Resistant     PIP/TAZO <=4 SENSITIVE Sensitive     * FEW MORGANELLA MORGANII    Coagulation Studies: No results for input(s): LABPROT, INR in the last 72 hours.  Urinalysis: No results for input(s): COLORURINE, LABSPEC, PHURINE, GLUCOSEU, HGBUR, BILIRUBINUR, KETONESUR, PROTEINUR, UROBILINOGEN, NITRITE, LEUKOCYTESUR in the last 72 hours.  Invalid input(s): APPERANCEUR    Imaging: Dg Chest 2 View  Result Date: 08/29/2018 CLINICAL DATA:  Acute CHF EXAM: CHEST - 2 VIEW COMPARISON:  08/28/2018 FINDINGS: Stable marked enlargement of the cardiopericardial silhouette. Dilated cardiomyopathy or pericardial effusion might account for this appearance. Low lung volumes with atelectasis at the left base. No overt pulmonary edema, effusion or pneumothorax. No suspicious osseous abnormalities. IMPRESSION: Marked enlargement of the cardiopericardial silhouette, stable since prior without active pulmonary disease. Electronically Signed   By: Ashley Royalty M.D.   On: 08/29/2018 22:14   Dg Foot Complete Left  Result Date: 08/29/2018 CLINICAL DATA:  Acute onset of left foot swelling and erythema. EXAM: LEFT FOOT  - COMPLETE 3+ VIEW COMPARISON:  Left foot radiographs performed 06/09/2018, and left foot MRI performed 06/10/2018 FINDINGS: There is been mildly increased erosion at the base of the first metatarsal, concerning for worsening osteomyelitis given the patient's symptoms. 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 Soft tissue swelling is noted about the forefoot. A soft tissue ulceration is again noted along the plantar aspect of the midfoot. Scattered vascular calcification is noted. The subtalar joint is unremarkable in appearance. IMPRESSION: 1. Mildly increased erosion at the base of the first metatarsal, concerning for worsening osteomyelitis given the patient's symptoms. 2. Underlying changes of Lisfranc injury and likely mild Charcot arthropathy again noted, with significant lateral and dorsal displacement of the bases of the second to fifth metatarsals. 3. Scattered vascular calcification noted. Electronically Signed   By: Francoise Schaumann.D.  On: 08/29/2018 17:39     Medications:   . sodium chloride Stopped (08/30/18 1524)  . furosemide 120 mg (08/30/18 2244)   . amiodarone  200 mg Oral BID  . apixaban  5 mg Oral BID  . carvedilol  25 mg Oral BID WC  . insulin aspart  0-5 Units Subcutaneous QHS  . insulin aspart  0-9 Units Subcutaneous TID WC  . isosorbide-hydrALAZINE  1 tablet Oral TID  . silver sulfADIAZINE   Topical Daily  . sodium chloride flush  3 mL Intravenous Q12H   sodium chloride, acetaminophen, oxyCODONE-acetaminophen, sodium chloride flush  Assessment/ Plan:   Acute on chronic renal insufficiency with stage IV chronic kidney disease secondary to cardiorenal syndrome and type 2 diabetes.  He has biventricular heart failure with an ejection fraction of 20%.  His baseline serum creatinine appears to be stable at about 3.5 mg/dL.   He has chronic diabetic foot ulcers and is followed by orthopedics.  There is absolutely no need for dialysis at this time.  However he has marked volume overload in this poorly responsive to IV Lasix.  Continue to avoid nonsteroidal inflammatory drugs Cox 2 inhibitors ACE inhibitors and ARB's.  Continue to avoid  IV contrast.  Hypertension/volume will try to augment diuresis with addition of metolazone 10 mg daily continue dietary sodium restriction.  Follow daily weights  Anemia this does not appear to be a significant issue at this time  Bones will check PTH  Diabetes mellitus per primary team  Paroxysmal atrial fibrillation.  Eliquis and amiodarone to be restarted by primary service.  Abnormal thyroid function studies outpatient follow-up for evaluation of thyroid replacement   LOS: 0 Sherril Croon @TODAY @10 :24 AM

## 2018-08-31 NOTE — H&P (Deleted)
PROGRESS NOTE    Joshua Massey  ION:[id number] DOB: Mar 11, 1976 DOA: 08/29/2018 PCP: Massey, Joshua G, MD   Brief Narrative:  Joshua Massey is Joshua Massey 43 y.o. 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  He was seen by his cardiologist today for evaluation of the symptoms, was found to be massively volume overloaded, and directed to the ED for evaluation and management of this.  He denies any chest pain, fevers, or chills.  Assessment & Plan:   Principal Problem:   Acute on chronic combined systolic and diastolic CHF (congestive heart failure) (HCC) Active Problems:   Hypertension   Coronary artery disease, 50% LAD at cath 02/16/12   Type 2 diabetes mellitus with vascular disease ([place])   CKD (chronic kidney disease), stage IV (HCC)   Idiopathic chronic venous hypertension of both lower extremities with ulcer and inflammation (HCC)   Subacute osteomyelitis, left ankle and foot ([place])   1. Acute on chronic combined CHF  - Presents with increased peripheral edema, wt-gain, and SOB  - TTE from June 2019 with EF 15-20%, diffuse HK, grade 2 diastolic dysfunction, mild MR, and severe LAE  - Repeat echo EF 20-25%. Diastolic dysfunction.  Normal RV function.  See report. - Treated in ED with Lasix 60 mg IV  - he takes 60 mg torsemide at home, lasix increased to 120 TID per nephrology (only received 1 dose so far) - Continue coreg, bidil - I/O, daily weights (weight up about 20 kg since December, ? Since 3/10).  Weight going up here? 1.3 L out yesterday.   - Per HF note, will consult nephrology Massey his CKD -  Diuresis per renal - Check post void as he's complaining of feeling like he's not emptying bladder.  Wt Readings from Last 3 Encounters:  08/31/18 (!) 157.7 kg  08/29/18 (!) 158.9 kg  08/28/18 136.1 kg   2. Diabetic foot infection with osteomyelitis   - Patient has chronic bilateral foot wounds and had debridement on the left in January  - Afebrile, now white count - CRP and ESR wnl  - Plain films of L foot with concern for increased erosion at base of first metataral concerning for worsening osteomyelitis - Seen by Dr. Sharol Massey 3/12, recommending compression for both legs, continue kneeling scooter and follow up in office in 1 week.  Discussed antibiotics with Dr. Sharol Massey and will plan on holding abx for now (though if hospitalization prolonged by number 1 may need to reevaluate this).   - wound care per dr. Sharol Massey and wound care c/s (planning for twice weekly unna boots to RLE, LLE silvadene to L foot wound - see instructions).  3. CKD IV  - SCr is 3.74 today, similar to priors (baseline between 3.25-4.73) - Follow closely during diuresis, renally-dose medications   - renal consult as noted above  4. CAD  - No anginal complaints  - Reports he was recently taken off of statin, will continue beta-blocker    5. Insulin-dependent DM  - A1c was 8.5% in December  - Check CBG's and continue SSI with Novolog    6. Hypertension  - BP elevated in ED, anticipate improvement with  diuresis  - Continue Bidil and Coreg   7. Paroxysmal atrial fibrillation  - In sinus rhythm on admission  - CHADS-VASc is 4 (CAD, CHF, DM, HTN)  - Resume eliquis, continue amiodarone   # Abnormal Thyroid Function Tests:  Follow outpatient, follow T4  DVT prophylaxis: eliquis Code Status: full  Family Communication: none at bedside Disposition Plan: pending evaluation by renal.  Volume overloaded, needs IV diuresis and renal evaluation prior to discharge.  Continues to require inpatient hospitalization.    Consultants:   nephrology  Procedures:   none  Antimicrobials: Anti-infectives (From admission, onward)   Start     Dose/Rate Route Frequency Ordered Stop   08/30/18 2200  vancomycin (VANCOCIN) 2,000 mg in sodium chloride 0.9 % 500 mL IVPB  Status:  Discontinued     2,000 mg 250 mL/hr over 120 Minutes Intravenous Every 24 hours 08/29/18 2223 08/30/18 1048   08/30/18 0500  ceFEPIme (MAXIPIME) 2 g in sodium chloride 0.9 % 100 mL IVPB  Status:  Discontinued     2 g 200 mL/hr over 30 Minutes Intravenous Every 12 hours 08/29/18 2212 08/30/18 1048   08/29/18 2215  metroNIDAZOLE (FLAGYL) IVPB 500 mg  Status:  Discontinued     500 mg 100 mL/hr over 60 Minutes Intravenous Every 8 hours 08/29/18 2201 08/30/18 1048   08/29/18 2215  vancomycin (VANCOCIN) 2,000 mg in sodium chloride 0.9 % 500 mL IVPB     2,000 mg 250 mL/hr over 120 Minutes Intravenous  Once 08/29/18 2208 08/30/18 0155   08/29/18 2215  ceFEPIme (MAXIPIME) 2 g in sodium chloride 0.9 % 100 mL IVPB  Status:  Discontinued     2 g 200 mL/hr over 30 Minutes Intravenous Every 12 hours 08/29/18 2211 08/29/18 2212   08/29/18 2115  piperacillin-tazobactam (ZOSYN) IVPB 3.375 g     3.375 g 100 mL/hr over 30 Minutes Intravenous  Once 08/29/18 2114 08/29/18 2209   08/29/18 2115  vancomycin (VANCOCIN) IVPB 1000 mg/200 mL premix  Status:  Discontinued     1,000 mg 200 mL/hr over 60 Minutes Intravenous  Once 08/29/18 2114 08/29/18 2208     Subjective: Not peeing Joshua Massey lot. Feels like having issues emptying his bladder.  Objective: Vitals:   08/30/18 1810 08/30/18 2017 08/31/18 0009 08/31/18 0527  BP: (!) 123/102 (!) 121/91 121/87 (!) 141/101  Pulse: (!) 108 (!) 104 (!) 104 (!) 103  Resp: 20 18 18 18   Temp: (!) 97.5 F (36.4 C) 98.3 F (36.8 C) 97.9 F (36.6 C) (!) 97.5 F (36.4 C)  TempSrc: Oral Oral Oral Oral  SpO2: 100% 97% 97% 96%  Weight:    (!) 157.7 kg  Height:        Intake/Output Summary (Last 24 hours) at 08/31/2018 0839  Last data filed at 08/31/2018 0529 Gross per 24 hour  Intake 600 ml  Output 1301 ml  Net -701 ml   Filed Weights   08/29/18 2332 08/30/18 0444 08/31/18 0527  Weight: (!) 156.3 kg (!) 157.3 kg (!) 157.7 kg    Examination:  General: No acute distress. Cardiovascular: Heart sounds show Shantrice Rodenberg regular rate, and rhythm. Lungs: Clear to auscultation bilaterally  Abdomen: Soft, nontender, nondistended  Neurological: Alert and oriented 3. Moves all extremities 4. Cranial nerves II through XII grossly intact. Skin: Warm and dry. No rashes or lesions. Extremities: RLE with unna boot.  LEE wrapped with intact dressing. Psychiatric: Mood and affect are normal. Insight and judgment are appropriate.   Data Reviewed:  I have personally reviewed following labs and imaging studies  CBC: Recent Labs  Lab 08/28/18 1624 08/29/18 1229 08/30/18 0542 08/31/18 0501  WBC 4.8 6.1 4.6 5.1  HGB 11.3* 11.5* 11.3* 11.5*  HCT 36.8* 38.0* 37.8* 37.8*  MCV 93.9 94.8 92.6 92.4  PLT 262 270 251 789   Basic Metabolic Panel: Recent Labs  Lab 08/28/18 1624 08/29/18 1229 08/30/18 0542 08/31/18 0501  NA 139 141 143 142  K 3.9 3.9 3.7 3.5  CL 109 110 111 108  CO2 21* 23 22 23   GLUCOSE 124* 154* 150* 138*  BUN 61* 62* 59* 59*  CREATININE 3.64* 3.60* 3.54* 3.74*  CALCIUM 7.9* 8.2* 8.1* 8.4*   GFR: Estimated Creatinine Clearance: 39.8 mL/min (Davene Jobin) (by C-G formula based on SCr of 3.74 mg/dL (H)). Liver Function Tests: Recent Labs  Lab 08/29/18 1229  AST 21  ALT 16  ALKPHOS 87  BILITOT 0.9  PROT 6.6  ALBUMIN 2.6*   No results for input(s): LIPASE, AMYLASE in the last 168 hours. No results for input(s): AMMONIA in the last 168 hours. Coagulation Profile: No results for input(s): INR, PROTIME in the last 168 hours. Cardiac Enzymes: No results for input(s): CKTOTAL, CKMB, CKMBINDEX, TROPONINI in the last 168 hours. BNP (last 3 results) No results for input(s): PROBNP in the last 8760 hours.  HbA1C: No results for input(s): HGBA1C in the last 72 hours. CBG: Recent Labs  Lab 08/30/18 0828 08/30/18 1225 08/30/18 1645 08/30/18 2147 08/31/18 0556  GLUCAP 130* 133* 124* 121* 142*   Lipid Profile: No results for input(s): CHOL, HDL, LDLCALC, TRIG, CHOLHDL, LDLDIRECT in the last 72 hours. Thyroid Function Tests: Recent Labs    08/29/18 1229 08/30/18 0542  TSH 5.906*  --   FREET4  --  1.32   Anemia Panel: No results for input(s): VITAMINB12, FOLATE, FERRITIN, TIBC, IRON, RETICCTPCT in the last 72 hours. Sepsis Labs: Recent Labs  Lab 08/29/18 1738  LATICACIDVEN 1.3    No results found for this or any previous visit (from the past 240 hour(s)).       Radiology Studies: Dg Chest 2 View  Result Date: 08/29/2018 CLINICAL DATA:  Acute CHF EXAM: CHEST - 2 VIEW COMPARISON:  08/28/2018 FINDINGS: Stable marked enlargement of the cardiopericardial silhouette. Dilated cardiomyopathy or pericardial effusion might account for this appearance. Low lung volumes with atelectasis at the left base. No overt pulmonary edema, effusion or pneumothorax. No suspicious osseous abnormalities. IMPRESSION: Marked enlargement of the cardiopericardial silhouette, stable since prior without active pulmonary disease. Electronically Signed   By: Ashley Royalty M.D.   On: 08/29/2018 22:14   Dg Foot Complete Left  Result Date: 08/29/2018 CLINICAL DATA:  Acute onset of left foot swelling and erythema. EXAM: LEFT FOOT - COMPLETE 3+ VIEW COMPARISON:  Left foot radiographs performed 06/09/2018, and left foot MRI performed 06/10/2018 FINDINGS: There is been mildly increased erosion at the base of the first metatarsal, concerning for worsening osteomyelitis Massey the patient's symptoms. 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 Soft tissue swelling is noted  about the forefoot. Kandy Towery soft tissue ulceration is again noted along the plantar aspect of the midfoot. Scattered vascular calcification is noted. The subtalar joint is unremarkable in appearance. IMPRESSION: 1. Mildly increased erosion at the base of the first metatarsal, concerning for worsening osteomyelitis Massey the patient's symptoms. 2. Underlying changes of Lisfranc injury  and likely mild Charcot arthropathy again noted, with significant lateral and dorsal displacement of the bases of the second to fifth metatarsals. 3. Scattered vascular calcification noted. Electronically Signed   By: Garald Balding M.D.   On: 08/29/2018 17:39        Scheduled Meds: . amiodarone  200 mg Oral BID  . apixaban  5 mg Oral BID  . carvedilol  25 mg Oral BID WC  . insulin aspart  0-5 Units Subcutaneous QHS  . insulin aspart  0-9 Units Subcutaneous TID WC  . isosorbide-hydrALAZINE  1 tablet Oral TID  . silver sulfADIAZINE   Topical Daily  . sodium chloride flush  3 mL Intravenous Q12H   Continuous Infusions: . sodium chloride Stopped (08/30/18 1524)  . furosemide 120 mg (08/30/18 2244)     LOS: 0 days    Time spent: over 6 min    Fayrene Helper, MD [facility] Pager AMION  If 7PM-7AM, please contact night-coverage [email or web address] Password [facility] 08/31/2018, 8:39 AM

## 2018-08-31 NOTE — Discharge Instructions (Signed)

## 2018-09-01 LAB — CBC
HCT: 36.4 % — ABNORMAL LOW (ref 39.0–52.0)
Hemoglobin: 11.3 g/dL — ABNORMAL LOW (ref 13.0–17.0)
MCH: 29 pg (ref 26.0–34.0)
MCHC: 31 g/dL (ref 30.0–36.0)
MCV: 93.3 fL (ref 80.0–100.0)
NRBC: 0 % (ref 0.0–0.2)
Platelets: 244 10*3/uL (ref 150–400)
RBC: 3.9 MIL/uL — ABNORMAL LOW (ref 4.22–5.81)
RDW: 18.6 % — ABNORMAL HIGH (ref 11.5–15.5)
WBC: 5.4 10*3/uL (ref 4.0–10.5)

## 2018-09-01 LAB — GLUCOSE, CAPILLARY
GLUCOSE-CAPILLARY: 148 mg/dL — AB (ref 70–99)
Glucose-Capillary: 128 mg/dL — ABNORMAL HIGH (ref 70–99)
Glucose-Capillary: 127 mg/dL — ABNORMAL HIGH (ref 70–99)
Glucose-Capillary: 137 mg/dL — ABNORMAL HIGH (ref 70–99)
Glucose-Capillary: 148 mg/dL — ABNORMAL HIGH (ref 70–99)
Glucose-Capillary: 158 mg/dL — ABNORMAL HIGH (ref 70–99)

## 2018-09-01 LAB — BASIC METABOLIC PANEL
Anion gap: 11 (ref 5–15)
BUN: 60 mg/dL — ABNORMAL HIGH (ref 6–20)
CO2: 22 mmol/L (ref 22–32)
Calcium: 8.6 mg/dL — ABNORMAL LOW (ref 8.9–10.3)
Chloride: 107 mmol/L (ref 98–111)
Creatinine, Ser: 3.62 mg/dL — ABNORMAL HIGH (ref 0.61–1.24)
GFR calc Af Amer: 23 mL/min — ABNORMAL LOW (ref >60)
GFR calc non Af Amer: 20 mL/min — ABNORMAL LOW (ref >60)
Glucose, Bld: 130 mg/dL — ABNORMAL HIGH (ref 70–99)
Potassium: 3.6 mmol/L (ref 3.5–5.1)
Sodium: 140 mmol/L (ref 135–145)

## 2018-09-01 NOTE — Progress Notes (Signed)
[facility] ROUNDING NOTE   Subjective:   Brief history a 43 year old gentleman presenting with advanced heart failure which appears decompensated with massive lower extremity edema and a 65 weight pound weight gain.  Ejection fraction shows an EF of about 20%.  He has stage IV chronic kidney disease with increasing creatinine about 3.5.  His chronic no-shows [facility].  He has nephrotic range proteinuria with a protein creatinine ratio of 6.3.  He is not on an ACE inhibitor or ARB.  His renal ultrasound on 06/14/2018 showed bilateral echogenic kidneys suggestive of chronic medical disease.  He was admitted for IV diuresis.  Blood pressure 112/80 pulse 89 temperature 98.2 O2 sats 93% room air  Great diuresis.  3 L 08/31/2018.  Amiodarone 200 mg twice daily Coreg 25 mg twice daily Lasix 120 mg IV every 8 hours, BiDil 1 tablet 3 times daily insulin sliding scale, Eliquis 5 mg twice daily Silvadene to wound 1% cream, metolazone 10 mg daily  Sodium 140 potassium 3.6 chloride 107 CO2 22 BUN 60 creatinine 3.62 glucose 130 WBC 5.4 hemoglobin 11.3 platelets 244   Objective:  Vital signs in last 24 hours:  Temp:  [97.8 F (36.6 C)-98.4 F (36.9 C)] 98.2 F (36.8 C) (03/14 0454) Pulse Rate:  [72-105] 89 (03/14 0454) Resp:  [18-20] 18 (03/14 0454) BP: (112-139)/(80-105) 112/80 (03/14 0454) SpO2:  [93 %-99 %] 93 % (03/14 0454) Weight:  [146.3 kg-156.3 kg] 146.3 kg (03/14 0454)  Weight change: -1.361 kg Filed Weights   08/31/18 0527 09/01/18 0022 09/01/18 0454  Weight: (!) 157.7 kg (!) 156.3 kg (!) 146.3 kg    Intake/Output: I/O last 3 completed shifts: In: 600 [P.O.:600] Out: 4701 [Urine:4700; Stool:1]   Intake/Output this shift:  Total I/O In: 240 [P.O.:240] Out: 450 [IOEVO:350]  CVS- RRR systolic murmur 3/6 left sternal edge no rubs no gallops RS- CTA no wheezes no rales ABD- BS present soft non-distended EXT-1+ pitting edema lower  extremities   Basic Metabolic Panel: Recent Labs  Lab 08/28/18 1624 08/29/18 1229 08/30/18 0542 08/31/18 0501 09/01/18 0445  NA 139 141 143 142 140  K 3.9 3.9 3.7 3.5 3.6  CL 109 110 111 108 107  CO2 21* 23 22 23 22   GLUCOSE 124* 154* 150* 138* 130*  BUN 61* 62* 59* 59* 60*  CREATININE 3.64* 3.60* 3.54* 3.74* 3.62*  CALCIUM 7.9* 8.2* 8.1* 8.4* 8.6*    Liver Function Tests: Recent Labs  Lab 08/29/18 1229  AST 21  ALT 16  ALKPHOS 87  BILITOT 0.9  PROT 6.6  ALBUMIN 2.6*   No results for input(s): LIPASE, AMYLASE in the last 168 hours. No results for input(s): AMMONIA in the last 168 hours.  CBC: Recent Labs  Lab 08/28/18 1624 08/29/18 1229 08/30/18 0542 08/31/18 0501 09/01/18 0445  WBC 4.8 6.1 4.6 5.1 5.4  HGB 11.3* 11.5* 11.3* 11.5* 11.3*  HCT 36.8* 38.0* 37.8* 37.8* 36.4*  MCV 93.9 94.8 92.6 92.4 93.3  PLT 262 270 251 253 244    Cardiac Enzymes: No results for input(s): CKTOTAL, CKMB, CKMBINDEX, TROPONINI in the last 168 hours.  BNP: Invalid input(s): POCBNP  CBG: Recent Labs  Lab 08/31/18 1155 08/31/18 2047 09/01/18 0448 09/01/18 0641 09/01/18 0719  GLUCAP 151* 154* 128* 127* 137*    Microbiology: Results for orders placed or performed during the hospital encounter of 06/09/18  Surgical PCR screen     Status: Abnormal   Collection Time: 06/10/18  9:55 AM  Result  Value Ref Range Status   MRSA, PCR NEGATIVE NEGATIVE Final   Staphylococcus aureus POSITIVE (A) NEGATIVE Final    Comment: (NOTE) The Xpert SA Assay (FDA approved for NASAL specimens in patients 89 years of age and older), is one component of a comprehensive surveillance program. It is not intended to diagnose infection nor to guide or monitor treatment.   Aerobic/Anaerobic Culture (surgical/deep wound)     Status: None   Collection Time: 06/10/18  2:15 PM  Result Value Ref Range Status   Specimen Description ABSCESS LEFT,FOOT  Final   Special Requests NONE  Final   Gram  Stain   Final    RARE WBC PRESENT, PREDOMINANTLY PMN FEW GRAM POSITIVE COCCI IN PAIRS RARE GRAM NEGATIVE RODS    Culture   Final    FEW MORGANELLA MORGANII MODERATE BACTEROIDES THETAIOTAOMICRON BETA LACTAMASE POSITIVE Performed at [facility], [place] [address], [place], [place] [address]    Report Status 06/15/2018 FINAL  Final   Organism ID, Bacteria MORGANELLA MORGANII  Final      Susceptibility   Morganella morganii - MIC*    AMPICILLIN >=32 RESISTANT Resistant     CEFAZOLIN >=64 RESISTANT Resistant     CEFEPIME <=1 SENSITIVE Sensitive     CEFTAZIDIME <=1 SENSITIVE Sensitive     CEFTRIAXONE <=1 SENSITIVE Sensitive     CIPROFLOXACIN <=0.25 SENSITIVE Sensitive     GENTAMICIN <=1 SENSITIVE Sensitive     IMIPENEM 1 SENSITIVE Sensitive     TRIMETH/SULFA >=320 RESISTANT Resistant     AMPICILLIN/SULBACTAM >=32 RESISTANT Resistant     PIP/TAZO <=4 SENSITIVE Sensitive     * FEW MORGANELLA MORGANII    Coagulation Studies: No results for input(s): LABPROT, INR in the last 72 hours.  Urinalysis: No results for input(s): COLORURINE, LABSPEC, PHURINE, GLUCOSEU, HGBUR, BILIRUBINUR, KETONESUR, PROTEINUR, UROBILINOGEN, NITRITE, LEUKOCYTESUR in the last 72 hours.  Invalid input(s): APPERANCEUR    Imaging: No results found.   Medications:   . sodium chloride Stopped (08/30/18 1524)  . furosemide 120 mg (09/01/18 0646)   . amiodarone  200 mg Oral BID  . apixaban  5 mg Oral BID  . carvedilol  25 mg Oral BID WC  . feeding supplement (PRO-STAT SUGAR FREE 64)  30 mL Oral BID  . insulin aspart  0-5 Units Subcutaneous QHS  . insulin aspart  0-9 Units Subcutaneous TID WC  . isosorbide-hydrALAZINE  1 tablet Oral TID  . metolazone  10 mg Oral Daily  . multivitamin with minerals  1 tablet Oral Daily  . silver sulfADIAZINE   Topical Daily  . sodium chloride flush  3 mL Intravenous Q12H   sodium chloride, acetaminophen, oxyCODONE-acetaminophen, sodium chloride flush  Assessment/  Plan:   Acute on chronic renal insufficiency with stage IV chronic kidney disease secondary to cardiorenal syndrome and type 2 diabetes.  He has biventricular heart failure with an ejection fraction of 20%.  His baseline serum creatinine appears to be stable.  He has chronic diabetic foot ulcers and is followed by orthopedics.  There is absolutely no need for dialysis at this time he has had tremendous diuresis with IV Lasix and metolazone.  Will continue IV Lasix and discontinue metolazone today.  Would continue to monitor over the weekend..  Continue to avoid nonsteroidal inflammatory drugs Cox 2 inhibitors ACE inhibitors and ARB's.  Continue to avoid  IV contrast.  Hypertension/volume will try to augment diuresis with addition of metolazone 10 mg daily continue dietary sodium restriction.  Follow daily weights  Anemia this does not appear to be a significant issue at this time  Bones will check PTH  Diabetes mellitus per primary team  Paroxysmal atrial fibrillation.  Eliquis and amiodarone to be restarted by primary service.  Abnormal thyroid function studies outpatient follow-up for evaluation of thyroid replacement   LOS: [place] @TODAY @9 :41 AM

## 2018-09-01 NOTE — Evaluation (Signed)
Physical Therapy Evaluation Patient Details Name: Joshua Massey MRN: [id number] DOB: December 16, 1975 Today's Date: 09/01/2018   History of Present Illness  43 year old gentleman presenting with advanced heart failure which appears decompensated with massive lower extremity edema and a 65 weight pound weight gain.  Clinical Impression  Patient seen for therapy assessment.  Mobilizing independently with knee scooter with no noted focal deficits at this time. Educated patient on energy conservation. No further acute PT needs. Will sign off. HR stable, saturations 99% dropping to 91% with activity. 2/4 DOE    Follow Up Recommendations No PT follow up    Equipment Recommendations  None recommended by PT    Recommendations for Other Services       Precautions / Restrictions Restrictions Weight Bearing Restrictions: Yes LLE Weight Bearing: Touchdown weight bearing      Mobility  Bed Mobility Overal bed mobility: Independent                Transfers Overall transfer level: Modified independent Equipment used: (knee scooter)             General transfer comment: no difficulty  Ambulation/Gait Ambulation/Gait assistance: Modified independent (Device/Increase time) Gait Distance (Feet): 400 Feet Assistive device: (knee scooter)       General Gait Details: patient utilizes knee scooter for mobility, no assist or cues required. HR stable with activity, saturations dropped from 99% to 91% on room air  Stairs            Wheelchair Mobility    Modified Rankin (Stroke Patients Only)       Balance                                             Pertinent Vitals/Pain Pain Assessment: No/denies pain    Home Living Family/patient expects to be discharged to:: Private residence Living Arrangements: Spouse/significant other Available Help at Discharge: Family;Available PRN/intermittently Type of Home: Apartment Home Access: Stairs to enter(1  step (curb)) Entrance Stairs-Rails: Right Entrance Stairs-Number of Steps: 2 Home Layout: One level Home Equipment: Cane - single point      Prior Function Level of Independence: Independent         Comments: works for newspaper     Hand Dominance   Dominant Hand: Right    Extremity/Trunk Assessment   Upper Extremity Assessment Upper Extremity Assessment: Overall WFL for tasks assessed    Lower Extremity Assessment Lower Extremity Assessment: LLE deficits/detail LLE Deficits / Details: Restrcited NWBing with knee scooter       Communication   Communication: No difficulties  Cognition Arousal/Alertness: Awake/alert Behavior During Therapy: WFL for tasks assessed/performed Overall Cognitive Status: Within Functional Limits for tasks assessed                                        General Comments      Exercises     Assessment/Plan    PT Assessment Patent does not need any further PT services  PT Problem List         PT Treatment Interventions      PT Goals (Current goals can be found in the Care Plan section)  Acute Rehab PT Goals PT Goal Formulation: All assessment and education complete, DC therapy    Frequency  Barriers to discharge        Co-evaluation               AM-PAC PT "6 Clicks" Mobility  Outcome Measure Help needed turning from your back to your side while in a flat bed without using bedrails?: None Help needed moving from lying on your back to sitting on the side of a flat bed without using bedrails?: None Help needed moving to and from a bed to a chair (including a wheelchair)?: None Help needed standing up from a chair using your arms (e.g., wheelchair or bedside chair)?: None Help needed to walk in hospital room?: None Help needed climbing 3-5 steps with a railing? : A Lot 6 Click Score: 22    End of Session   Activity Tolerance: Patient tolerated treatment well Patient left: in bed(sitting  EOB) Nurse Communication: Mobility status PT Visit Diagnosis: Other (comment)    Time: 7076-1518 PT Time Calculation (min) (ACUTE ONLY): 17 min   Charges:   PT Evaluation $PT Eval Low Complexity: [place], PT DPT  Board Certified Neurologic Specialist Acute Rehabilitation Services Pager [phone] Office [place] 09/01/2018, 12:21 PM

## 2018-09-01 NOTE — Progress Notes (Signed)
PROGRESS NOTE    [name]  BUL:[id number] DOB: 1975-09-04 DOA: 08/29/2018 PCP: [name], [name], MD   Brief Narrative:  Joshua Massey is a 43 y.o. 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  He was seen by his cardiologist today for evaluation of the symptoms, was found to be massively volume overloaded, and directed to the ED for evaluation and management of this.  He denies any chest pain, fevers, or chills.  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 Acute on chronic combined CHF  - Presents with increased peripheral edema, wt-gain, and SOB  - TTE from June 2019 with EF 15-20%, diffuse HK, grade 2 diastolic dysfunction, mild MR, and severe LAE  - Repeat echo EF 20-25%. Diastolic dysfunction.  Normal RV function.  Mild sparing of contractile performance of the basal inferolateral segment.  See report. - Treated in ED with Lasix 60 mg IV  - he takes 60 mg torsemide at home, lasix increased to 120 TID per nephrology.  Metolazone was added per renal. - Continue coreg, bidil - I/O, daily weights (weight up about 20 kg since December, ? Since 3/10).   - Weight down today to 146.3 kg with lasix/metolazone - Per HF note, will consult nephrology given his CKD - Diuresis per renal - Outpatient cards follow up  Wt Readings from Last 3 Encounters:  09/01/18 (!) 146.3 kg  08/29/18 (!) 158.9 kg  08/28/18 136.1 kg   2. Diabetic foot infection with osteomyelitis   - Patient has chronic bilateral foot wounds and had debridement on the left in January  - Afebrile, now white count - CRP and ESR wnl  - Plain films of L foot with concern for increased erosion at base of first metataral concerning for worsening osteomyelitis - Seen by Dr. Sharol Given 3/12, recommending compression for both legs, continue kneeling scooter and follow up in office in 1 week.  Discussed antibiotics with Dr. Sharol Given and will plan on holding abx for now (though if hospitalization prolonged by number 1 may need to reevaluate this).   - wound care per dr. Sharol Given and wound care c/s (planning for twice weekly unna boots to RLE, LLE silvadene to L foot wound - see instructions).  3. CKD IV  - SCr is 3.62 today, similar to priors (baseline between 3.25-4.73) - Follow closely during diuresis, renally-dose medications   - renal consult as noted above  4. CAD  - No anginal complaints  - Reports he was recently taken off of statin, will continue beta-blocker    5. Insulin-dependent DM  - A1c was 8.5% in December  - Check CBG's and continue SSI with Novolog    6. Hypertension  - BP  elevated in ED, anticipate improvement with diuresis  - Continue Bidil and Coreg   7. Paroxysmal atrial fibrillation  - In sinus rhythm on admission  - CHADS-VASc is 4 (CAD, CHF, DM, HTN)  - Resume eliquis, continue amiodarone   # Abnormal Thyroid Function Tests:  Follow outpatient, follow T4 (wnl)  DVT prophylaxis: eliquis Code Status: full  Family Communication: none at bedside Disposition Plan: pending evaluation by renal.  Volume overloaded, needs IV diuresis and renal evaluation prior to  discharge.  Continues to require inpatient hospitalization.   Consultants:   nephrology  Procedures:  echo IMPRESSIONS    1. The left ventricle has severely reduced systolic function, with an ejection fraction of 20-25%. The cavity size was mildly dilated. There is mildly increased left ventricular wall thickness. Left ventricular diastolic Doppler parameters are  consistent with impaired relaxation. Left ventricular diffuse hypokinesis.  2. There is mild sparing of contractile performance of the basal inferolateral segment.  3. The right ventricle has normal systolic function. The cavity was normal. There is no increase in right ventricular wall thickness.  4. Left atrial size was mild-moderately dilated.  5. Small pericardial effusion.  6. The pericardial effusion is posterior to the left ventricle.  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 Mild thickening of the mitral valve leaflet.  8. Pulmonic valve regurgitation is moderate is mild by color flow Doppler.  9. Pulmonary hypertension is moderate. 10. The inferior vena cava was dilated in size with <50% respiratory variability.  Antimicrobials: Anti-infectives (From admission, onward)   Start     Dose/Rate Route Frequency Ordered Stop   08/30/18 2200  vancomycin (VANCOCIN) 2,000 mg in sodium chloride 0.9 % 500 mL IVPB  Status:  Discontinued     2,000 mg 250 mL/hr over 120 Minutes Intravenous Every 24 hours 08/29/18 2223 08/30/18 1048   08/30/18 0500  ceFEPIme (MAXIPIME) 2 g in sodium chloride 0.9 % 100 mL IVPB  Status:  Discontinued     2 g 200 mL/hr over 30 Minutes Intravenous Every 12 hours 08/29/18 2212 08/30/18 1048   08/29/18 2215  metroNIDAZOLE (FLAGYL) IVPB 500 mg  Status:  Discontinued     500 mg 100 mL/hr over 60 Minutes Intravenous Every 8 hours 08/29/18 2201 08/30/18 1048   08/29/18 2215  vancomycin (VANCOCIN) 2,000 mg in sodium chloride 0.9 % 500 mL IVPB     2,000 mg 250 mL/hr over 120 Minutes Intravenous  Once  08/29/18 2208 08/30/18 0155   08/29/18 2215  ceFEPIme (MAXIPIME) 2 g in sodium chloride 0.9 % 100 mL IVPB  Status:  Discontinued     2 g 200 mL/hr over 30 Minutes Intravenous Every 12 hours 08/29/18 2211 08/29/18 2212   08/29/18 2115  piperacillin-tazobactam (ZOSYN) IVPB 3.375 g     3.375 g 100 mL/hr over 30 Minutes Intravenous  Once 08/29/18 2114 08/29/18 2209   08/29/18 2115  vancomycin (VANCOCIN) IVPB 1000 mg/200 mL premix  Status:  Discontinued     1,000 mg 200 mL/hr over 60 Minutes Intravenous  Once 08/29/18 2114 08/29/18 2208     Subjective: Peeing more with recent regimen change. Feels like he still has some weight on him.  Objective: Vitals:   09/01/18 0046 09/01/18 0450 09/01/18 0454 09/01/18 1149  BP: (!) 139/104 (!) 135/99 112/80 (!) 126/99  Pulse: 73 72 89 69  Resp: _0 Temp: 98.4 F (36.9 C) 98.1 F (36.7 C) 98.2 F (36.8 C) (!) 97.5 F (36.4 C)  TempSrc:  Oral Oral Oral Oral  SpO2: 97% 99% 93% 99%  Weight:   (!) 146.3 kg   Height:        Intake/Output Summary (Last 24 hours) at 09/01/2018 1413 Last data filed at 09/01/2018 1201 Gross per 24 hour  Intake 720 ml  Output 4651 ml  Net -3931 ml   Filed Weights   08/31/18 0527 09/01/18 0022 09/01/18 0454  Weight: (!) 157.7 kg (!) 156.3 kg (!) 146.3 kg    Examination:  General: No acute distress. Cardiovascular: Heart sounds show a regular rate, and rhythm. Lungs: Clear to auscultation bilaterally  Abdomen: Soft, nontender, nondistended Neurological: Alert and oriented 3. Moves all extremities 4. Cranial nerves II through XII grossly intact. Skin: Warm and dry. No rashes or lesions. Extremities: unna boot to RLE, dressing intact to LLE    Data Reviewed: I have personally reviewed following labs and imaging studies  CBC: Recent Labs  Lab 08/28/18 1624 08/29/18 1229 08/30/18 0542 08/31/18 0501 09/01/18 0445  WBC 4.8 6.1 4.6 5.1 5.4  HGB 11.3* 11.5* 11.3* 11.5* 11.3*  HCT 36.8* 38.0*  37.8* 37.8* 36.4*  MCV 93.9 94.8 92.6 92.4 93.3  PLT 262 270 251 253 127   Basic Metabolic Panel: Recent Labs  Lab 08/28/18 1624 08/29/18 1229 08/30/18 0542 08/31/18 0501 09/01/18 0445  NA 139 141 143 142 140  K 3.9 3.9 3.7 3.5 3.6  CL 109 110 111 108 107  CO2 21* _0 GLUCOSE 124* 154* 150* 138* 130*  BUN 61* 62* 59* 59* 60*  CREATININE 3.64* 3.60* 3.54* 3.74* 3.62*  CALCIUM 7.9* 8.2* 8.1* 8.4* 8.6*   GFR: Estimated Creatinine Clearance: 39.4 mL/min (A) (by C-G formula based on SCr of 3.62 mg/dL (H)). Liver Function Tests: Recent Labs  Lab 08/29/18 1229  AST 21  ALT 16  ALKPHOS 87  BILITOT 0.9  PROT 6.6  ALBUMIN 2.6*   No results for input(s): LIPASE, AMYLASE in the last 168 hours. No results for input(s): AMMONIA in the last 168 hours. Coagulation Profile: No results for input(s): INR, PROTIME in the last 168 hours. Cardiac Enzymes: No results for input(s): CKTOTAL, CKMB, CKMBINDEX, TROPONINI in the last 168 hours. BNP (last 3 results) No results for input(s): PROBNP in the last 8760 hours. HbA1C: No results for input(s): HGBA1C in the last 72 hours. CBG: Recent Labs  Lab 08/31/18 2047 09/01/18 0448 09/01/18 0641 09/01/18 0719 09/01/18 1147  GLUCAP 154* 128* 127* 137* 148*   Lipid Profile: No results for input(s): CHOL, HDL, LDLCALC, TRIG, CHOLHDL, LDLDIRECT in the last 72 hours. Thyroid Function Tests: Recent Labs    08/30/18 0542  FREET4 1.32   Anemia Panel: No results for input(s): VITAMINB12, FOLATE, FERRITIN, TIBC, IRON, RETICCTPCT in the last 72 hours. Sepsis Labs: Recent Labs  Lab 08/29/18 1738  LATICACIDVEN 1.3    No results found for this or any previous visit (from the past 240 hour(s)).       Radiology Studies: No results found.      Scheduled Meds: . amiodarone  200 mg Oral BID  . apixaban  5 mg Oral BID  . carvedilol  25 mg Oral BID WC  . feeding supplement (PRO-STAT SUGAR FREE 64)  30 mL Oral BID  .  insulin aspart  0-5 Units Subcutaneous QHS  . insulin aspart  0-9 Units Subcutaneous TID WC  . isosorbide-hydrALAZINE  1 tablet Oral TID  . multivitamin with minerals  1 tablet Oral Daily  .  silver sulfADIAZINE   Topical Daily  . sodium chloride flush  3 mL Intravenous Q12H   Continuous Infusions: . sodium chloride Stopped (08/30/18 1524)  . furosemide 120 mg (09/01/18 0646)     LOS: 1 day    Time spent: over 30 min    Fayrene Helper, MD [facility] Pager AMION  If 7PM-7AM, please contact night-coverage [email or web address] Password [facility] 09/01/2018, 2:13 PM

## 2018-09-02 LAB — BASIC METABOLIC PANEL
Anion gap: 9 (ref 5–15)
BUN: 64 mg/dL — ABNORMAL HIGH (ref 6–20)
CALCIUM: 8.4 mg/dL — AB (ref 8.9–10.3)
CO2: 21 mmol/L — ABNORMAL LOW (ref 22–32)
Chloride: 108 mmol/L (ref 98–111)
Creatinine, Ser: 3.55 mg/dL — ABNORMAL HIGH (ref 0.61–1.24)
GFR calc non Af Amer: 20 mL/min — ABNORMAL LOW (ref >60)
GFR, EST AFRICAN AMERICAN: 23 mL/min — AB (ref >60)
Glucose, Bld: 116 mg/dL — ABNORMAL HIGH (ref 70–99)
Potassium: 4 mmol/L (ref 3.5–5.1)
Sodium: 138 mmol/L (ref 135–145)

## 2018-09-02 LAB — CBC
HCT: 37.4 % — ABNORMAL LOW (ref 39.0–52.0)
Hemoglobin: 11.8 g/dL — ABNORMAL LOW (ref 13.0–17.0)
MCH: 28.6 pg (ref 26.0–34.0)
MCHC: 31.6 g/dL (ref 30.0–36.0)
MCV: 90.6 fL (ref 80.0–100.0)
NRBC: 0 % (ref 0.0–0.2)
Platelets: 260 10*3/uL (ref 150–400)
RBC: 4.13 MIL/uL — AB (ref 4.22–5.81)
RDW: 18.4 % — ABNORMAL HIGH (ref 11.5–15.5)
WBC: 5.1 10*3/uL (ref 4.0–10.5)

## 2018-09-02 LAB — GLUCOSE, CAPILLARY
GLUCOSE-CAPILLARY: 112 mg/dL — AB (ref 70–99)
GLUCOSE-CAPILLARY: 114 mg/dL — AB (ref 70–99)
GLUCOSE-CAPILLARY: 175 mg/dL — AB (ref 70–99)
Glucose-Capillary: 144 mg/dL — ABNORMAL HIGH (ref 70–99)
Glucose-Capillary: 152 mg/dL — ABNORMAL HIGH (ref 70–99)
Glucose-Capillary: 161 mg/dL — ABNORMAL HIGH (ref 70–99)

## 2018-09-02 LAB — MAGNESIUM: Magnesium: 2 mg/dL (ref 1.7–2.4)

## 2018-09-02 NOTE — Progress Notes (Signed)
**Note De-Identified vi Obfusction** PROGRESS NOTE    Joshua Massey  DZH:[id number] DOB: 07-16-1975 DOA: 08/29/2018 PCP: Mrtinique, [name], MD   Brief Nrrtive:  Joshua Massey is  43 y.o. 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  He ws seen by his crdiologist tody for evlution of the symptoms, ws found to be mssively volume overloded, nd directed to the ED for evlution nd mngement of this.  He denies ny chest pin, fevers, or chills.  Assessment & Pln:   Principl Problem:   Acute on chronic combined systolic nd distolic CHF (congestive hert filure) (HCC) Active Problems:   Hypertension   Coronry rtery disese, 50% LAD t cth 02/16/12   Type 2 dibetes mellitus with vsculr disese ([place])   CKD (chronic kidney disese), stge IV (HCC)   Idiopthic chronic venous hypertension of both lower extremities with ulcer nd inflmmtion (HCC)   Subcute osteomyelitis, left nkle nd foot ([place])   Hert filure ([place])   1. Acute on chronic combined CHF  - Presents with incresed peripherl edem, wt-gin, nd SOB  - TTE from June 2019 with EF 15-20%, diffuse HK, grde 2 distolic dysfunction, mild MR, nd severe LAE  - Repet echo EF 20-25%. Distolic dysfunction.  Norml RV function.  Mild spring of contrctile performnce of the bsl inferolterl segment.  See report. - Treted in ED with Lsix 60 mg IV  - he tkes 60 mg torsemide t home, lsix incresed to 120 TID per nephrology.  Metolzone ws dded per renl. - Continue coreg, bidil - I/O, dily weights (weight up bout 20 kg since December, ? Since 3/10).   - Good output, but weight up.  Suspect inccurte weight.  Continue lsix per renl.  Holding metolzone. - renl recommending 40 mg torsemide BID when converting to orl - Per HF note, will consult nephrology given his CKD - Diuresis per renl - Outptient crds follow up  Wt Redings from Lst 3 Encounters:  09/02/18 (!) 152.4 kg  08/29/18 (!) 158.9 kg  08/28/18 136.1 kg   2. Dibetic foot infection with osteomyelitis   - Ptient hs chronic bilterl foot wounds nd hd debridement on the left in [place]  - Afebrile, now white count - CRP nd ESR wnl  - Plin films of L foot with concern for incresed erosion t bse of first mettrl concerning for worsening osteomyelitis - Seen by Dr. Shrol Given 3/12, recommending compression for both legs, continue kneeling scooter nd follow up in office in 1 week.  Discussed ntibiotics with Dr. Shrol Given nd will pln on holding bx for now (though if hospitliztion prolonged by number 1 my need to reevlute this).   - wound cre per dr. Shrol Given nd wound cre c/s (plnning for twice weekly unn boots to RLE, LLE silvdene to L foot wound - see instructions).  3. CKD IV  - SCr is 3.55 tody, similr to priors (bseline between 3.25-4.73) - Follow closely during diuresis, renlly-dose medictions   - renl consult s noted bove  4. CAD  - No nginl complints  - Reports he ws recently tken off of sttin, will continue bet-blocker    5. Insulin-dependent DM  - A1c ws  8.5% in December  - Check CBG's and continue SSI with Novolog    6. Hypertension  - BP elevated in ED, anticipate improvement with diuresis  - Continue Bidil and Coreg   7. Paroxysmal atrial fibrillation  - In sinus rhythm on admission  - CHADS-VASc is 4 (CAD, CHF, DM, HTN)  - Resume eliquis, continue amiodarone   # Abnormal Thyroid Function Tests:  Follow outpatient, follow T4 (wnl)  DVT prophylaxis: eliquis Code Status: full  Family Communication: none at bedside  Disposition Plan: pending evaluation by renal.  Volume overloaded, needs IV diuresis and renal evaluation prior to discharge.  Continues to require inpatient hospitalization.   Consultants:   nephrology  Procedures:  echo IMPRESSIONS    1. The left ventricle has severely reduced systolic function, with an ejection fraction of 20-25%. The cavity size was mildly dilated. There is mildly increased left ventricular wall thickness. Left ventricular diastolic Doppler parameters are  consistent with impaired relaxation. Left ventricular diffuse hypokinesis.  2. There is mild sparing of contractile performance of the basal inferolateral segment.  3. The right ventricle has normal systolic function. The cavity was normal. There is no increase in right ventricular wall thickness.  4. Left atrial size was mild-moderately dilated.  5. Small pericardial effusion.  6. The pericardial effusion is posterior to the left ventricle.  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 Mild thickening of the mitral valve leaflet.  8. Pulmonic valve regurgitation is moderate is mild by color flow Doppler.  9. Pulmonary hypertension is moderate. 10. The inferior vena cava was dilated in size with <50% respiratory variability.  Antimicrobials: Anti-infectives (From admission, onward)   Start     Dose/Rate Route Frequency Ordered Stop   08/30/18 2200  vancomycin (VANCOCIN) 2,000 mg in sodium chloride 0.9 % 500 mL IVPB  Status:  Discontinued     2,000 mg 250 mL/hr over 120 Minutes Intravenous Every 24 hours 08/29/18 2223 08/30/18 1048   08/30/18 0500  ceFEPIme (MAXIPIME) 2 g in sodium chloride 0.9 % 100 mL IVPB  Status:  Discontinued     2 g 200 mL/hr over 30 Minutes Intravenous Every 12 hours 08/29/18 2212 08/30/18 1048   08/29/18 2215  metroNIDAZOLE (FLAGYL) IVPB 500 mg  Status:  Discontinued     500 mg 100 mL/hr over 60 Minutes Intravenous Every 8 hours 08/29/18 2201 08/30/18 1048   08/29/18 2215  vancomycin  (VANCOCIN) 2,000 mg in sodium chloride 0.9 % 500 mL IVPB     2,000 mg 250 mL/hr over 120 Minutes Intravenous  Once 08/29/18 2208 08/30/18 0155   08/29/18 2215  ceFEPIme (MAXIPIME) 2 g in sodium chloride 0.9 % 100 mL IVPB  Status:  Discontinued     2 g 200 mL/hr over 30 Minutes Intravenous Every 12 hours 08/29/18 2211 08/29/18 2212   08/29/18 2115  piperacillin-tazobactam (ZOSYN) IVPB 3.375 g     3.375 g 100 mL/hr over 30 Minutes Intravenous  Once 08/29/18 2114 08/29/18 2209   08/29/18 2115  vancomycin (VANCOCIN) IVPB 1000 mg/200 mL premix  Status:  Discontinued     1,000 mg 200 mL/hr over 60 Minutes Intravenous  Once 08/29/18 2114 08/29/18 2208     Subjective: Still feels overloaded, but better. No other questions or concerns.  Objective: Vitals:   09/01/18 1149 09/01/18 2007 09/02/18 0427 09/02/18 1237  BP: (!) 126/99 (!) 142/103 (!) 148/108 (!) 136/100  Pulse: 69 69 (!) 117 74  Resp: 20 18 18 18   Temp: (!) 97.5 **Note De-Identified vi Obfusction** F (36.4 C) 98.1 F (36.7 C) 97.8 F (36.6 C) (!) 97.3 F (36.3 C)  TempSrc: Orl Orl Orl Orl  SpO2: 99% 97% 95% 95%  Weight:   (!) 152.4 kg   Height:        Intke/Output Summry (Lst 24 hours) t 09/02/2018 1554 Lst dt filed t 09/02/2018 1243 Gross per 24 hour  Intke 723 ml  Output 3500 ml  Net -2777 ml   Filed Weights   09/01/18 0022 09/01/18 0454 09/02/18 0427  Weight: (!) 156.3 kg (!) 146.3 kg (!) 152.4 kg    Exmintion:  Generl: No cute distress. Crdiovsculr: Hert sounds show  regulr rte, nd rhythm.  Lungs: Cler to usculttion bilterlly  bdomen: Soft, nontender, nondistended Neurologicl: lert nd oriented 3. Moves ll extremities 4. Crnil nerves II through XII grossly intct. Skin: Wrm nd dry. No rshes or lesions. Extremities: unn boot on R.  Dressing intct on L.  Edem below dressings improving. Psychitric: Mood nd ffect re norml. Insight nd judgment re pproprite.    Dt Reviewed: I hve personlly  reviewed following lbs nd imging studies  CBC: Recent Lbs  Lb 08/29/18 1229 08/30/18 0542 08/31/18 0501 09/01/18 0445 09/02/18 0720  WBC 6.1 4.6 5.1 5.4 5.1  HGB 11.5* 11.3* 11.5* 11.3* 11.8*  HCT 38.0* 37.8* 37.8* 36.4* 37.4*  MCV 94.8 92.6 92.4 93.3 90.6  PLT 270 251 253 244 951   Bsic Metbolic Pnel: Recent Lbs  Lb 08/29/18 1229 08/30/18 0542 08/31/18 0501 09/01/18 0445 09/02/18 0720  N 141 143 142 140 138  K 3.9 3.7 3.5 3.6 4.0  CL 110 111 108 107 108  CO2 23 22 23 22  21*  GLUCOSE 154* 150* 138* 130* 116*  BUN 62* 59* 59* 60* 64*  CRETININE 3.60* 3.54* 3.74* 3.62* 3.55*  CLCIUM 8.2* 8.1* 8.4* 8.6* 8.4*  MG  --   --   --   --  2.0   GFR: Estimted Cretinine Clernce: 41.1 mL/min () (by [place] formul bsed on SCr of 3.55 mg/dL (H)). Liver Function Tests: Recent Lbs  Lb 08/29/18 1229  ST 21  LT 16  LKPHOS 87  BILITOT 0.9  PROT 6.6  LBUMIN 2.6*   No results for input(s): LIPSE, MYLSE in the lst 168 hours. No results for input(s): MMONI in the lst 168 hours. Cogultion Profile: No results for input(s): INR, PROTIME in the lst 168 hours. Crdic Enzymes: No results for input(s): CKTOTL, CKMB, CKMBINDEX, TROPONINI in the lst 168 hours. BNP (lst 3 results) No results for input(s): PROBNP in the lst 8760 hours. Hb1C: No results for input(s): HGB1C in the lst 72 hours. CBG: Recent Lbs  Lb 09/01/18 2117 09/02/18 0039 09/02/18 0424 09/02/18 0620 09/02/18 1158  GLUCP 148* 152* 112* 114* 144*   Lipid Profile: No results for input(s): CHOL, HDL, LDLCLC, TRIG, CHOLHDL, LDLDIRECT in the lst 72 hours. Thyroid Function Tests: No results for input(s): TSH, T4TOTL, FREET4, T3FREE, THYROIDB in the lst 72 hours. nemi Pnel: No results for input(s): VITMINB12, FOLTE, FERRITIN, TIBC, IRON, RETICCTPCT in the lst 72 hours. Sepsis Lbs: Recent Lbs  Lb 08/29/18 1738  LTICCIDVEN 1.3    No results found for this  or ny previous visit (from the pst 240 hour(s)).       Rdiology Studies: No results found.      Scheduled Meds: . miodrone  200 mg Orl BID  . pixbn  5 mg Orl BID  . crvedilol  25 mg Orl BID  WC  . feeding supplement (PRO-STAT SUGAR FREE 64)  30 mL Oral BID  . insulin aspart  0-5 Units Subcutaneous QHS  . insulin aspart  0-9 Units Subcutaneous TID WC  . isosorbide-hydrALAZINE  1 tablet Oral TID  . multivitamin with minerals  1 tablet Oral Daily  . silver sulfADIAZINE   Topical Daily  . sodium chloride flush  3 mL Intravenous Q12H   Continuous Infusions: . sodium chloride Stopped (08/30/18 1524)  . furosemide 120 mg (09/02/18 1432)     LOS: 2 days    Time spent: over 30 min    Fayrene Helper, MD [facility] Pager AMION  If 7PM-7AM, please contact night-coverage [email or web address] Password [facility] 09/02/2018, 3:54 PM

## 2018-09-02 NOTE — Progress Notes (Addendum)
[facility] ROUNDING NOTE   Subjective:   Brief history a 43 year old gentleman presenting with advanced heart failure which appears decompensated with massive lower extremity edema and a 65 weight pound weight gain.  Ejection fraction shows an EF of about 20%.  He has stage IV chronic kidney disease with increasing creatinine about 3.5.  His chronic no-shows [facility].  He has nephrotic range proteinuria with a protein creatinine ratio of 6.3.  He is not on an ACE inhibitor or ARB.  His renal ultrasound on 06/14/2018 showed bilateral echogenic kidneys suggestive of chronic medical disease.  He was admitted for IV diuresis.  Blood pressure 140/108 pulse 117 temperature 97.8 O2 sats 95% room air  Great diuresis.  3.5 L diuresed 09/01/2018 not reflected by decreased weight.  His weight this morning is 152.4 kg  Amiodarone 200 mg twice daily Coreg 25 mg twice daily Lasix 120 mg IV every 8 hours, BiDil 1 tablet 3 times daily insulin sliding scale, Eliquis 5 mg twice daily Silvadene to wound 1% cream,     Sodium 138 potassium 4.0 chloride 108 CO2 21 BUN 64 creatinine 3.55 glucose 116 calcium 8.4 magnesium 2.0 WBC 5.1 hemoglobin 9.8 platelets 260   Objective:  Vital signs in last 24 hours:  Temp:  [97.5 F (36.4 C)-98.1 F (36.7 C)] 97.8 F (36.6 C) (03/15 0427) Pulse Rate:  [69-117] 117 (03/15 0427) Resp:  [18-20] 18 (03/15 0427) BP: (126-148)/(99-108) 148/108 (03/15 0427) SpO2:  [95 %-99 %] 95 % (03/15 0427) Weight:  [152.4 kg] 152.4 kg (03/15 0427)  Weight change: -3.946 kg Filed Weights   09/01/18 0022 09/01/18 0454 09/02/18 0427  Weight: (!) 156.3 kg (!) 146.3 kg (!) 152.4 kg    Intake/Output: I/O last 3 completed shifts: In: 480 [P.O.:480] Out: 5750 [Urine:5750]   Intake/Output this shift:  Total I/O In: -  Out: 700 [EHUDJ:497]  CVS- RRR systolic murmur 3/6 left sternal edge no rubs no gallops RS- CTA no wheezes no rales ABD- BS present  soft non-distended EXT-1+ pitting edema lower extremities   Basic Metabolic Panel: Recent Labs  Lab 08/28/18 1624 08/29/18 1229 08/30/18 0542 08/31/18 0501 09/01/18 0445  NA 139 141 143 142 140  K 3.9 3.9 3.7 3.5 3.6  CL 109 110 111 108 107  CO2 21* 23 22 23 22   GLUCOSE 124* 154* 150* 138* 130*  BUN 61* 62* 59* 59* 60*  CREATININE 3.64* 3.60* 3.54* 3.74* 3.62*  CALCIUM 7.9* 8.2* 8.1* 8.4* 8.6*    Liver Function Tests: Recent Labs  Lab 08/29/18 1229  AST 21  ALT 16  ALKPHOS 87  BILITOT 0.9  PROT 6.6  ALBUMIN 2.6*   No results for input(s): LIPASE, AMYLASE in the last 168 hours. No results for input(s): AMMONIA in the last 168 hours.  CBC: Recent Labs  Lab 08/29/18 1229 08/30/18 0542 08/31/18 0501 09/01/18 0445 09/02/18 0720  WBC 6.1 4.6 5.1 5.4 5.1  HGB 11.5* 11.3* 11.5* 11.3* 11.8*  HCT 38.0* 37.8* 37.8* 36.4* 37.4*  MCV 94.8 92.6 92.4 93.3 90.6  PLT 270 251 253 244 260    Cardiac Enzymes: No results for input(s): CKTOTAL, CKMB, CKMBINDEX, TROPONINI in the last 168 hours.  BNP: Invalid input(s): POCBNP  CBG: Recent Labs  Lab 09/01/18 1628 09/01/18 2117 09/02/18 0039 09/02/18 0424 09/02/18 0620  GLUCAP 158* 148* 152* 112* 114*    Microbiology: Results for orders placed or performed during the hospital encounter of 06/09/18  Surgical PCR screen  Status: Abnormal   Collection Time: 06/10/18  9:55 AM  Result Value Ref Range Status   MRSA, PCR NEGATIVE NEGATIVE Final   Staphylococcus aureus POSITIVE (A) NEGATIVE Final    Comment: (NOTE) The Xpert SA Assay (FDA approved for NASAL specimens in patients 61 years of age and older), is one component of a comprehensive surveillance program. It is not intended to diagnose infection nor to guide or monitor treatment.   Aerobic/Anaerobic Culture (surgical/deep wound)     Status: None   Collection Time: 06/10/18  2:15 PM  Result Value Ref Range Status   Specimen Description ABSCESS LEFT,FOOT   Final   Special Requests NONE  Final   Gram Stain   Final    RARE WBC PRESENT, PREDOMINANTLY PMN FEW GRAM POSITIVE COCCI IN PAIRS RARE GRAM NEGATIVE RODS    Culture   Final    FEW MORGANELLA MORGANII MODERATE BACTEROIDES THETAIOTAOMICRON BETA LACTAMASE POSITIVE Performed at [facility], [place] [address] [place], [place] [address]    Report Status 06/15/2018 FINAL  Final   Organism ID, Bacteria MORGANELLA MORGANII  Final      Susceptibility   Morganella morganii - MIC*    AMPICILLIN >=32 RESISTANT Resistant     CEFAZOLIN >=64 RESISTANT Resistant     CEFEPIME <=1 SENSITIVE Sensitive     CEFTAZIDIME <=1 SENSITIVE Sensitive     CEFTRIAXONE <=1 SENSITIVE Sensitive     CIPROFLOXACIN <=0.25 SENSITIVE Sensitive     GENTAMICIN <=1 SENSITIVE Sensitive     IMIPENEM 1 SENSITIVE Sensitive     TRIMETH/SULFA >=320 RESISTANT Resistant     AMPICILLIN/SULBACTAM >=32 RESISTANT Resistant     PIP/TAZO <=4 SENSITIVE Sensitive     * FEW MORGANELLA MORGANII    Coagulation Studies: No results for input(s): LABPROT, INR in the last 72 hours.  Urinalysis: No results for input(s): COLORURINE, LABSPEC, PHURINE, GLUCOSEU, HGBUR, BILIRUBINUR, KETONESUR, PROTEINUR, UROBILINOGEN, NITRITE, LEUKOCYTESUR in the last 72 hours.  Invalid input(s): APPERANCEUR    Imaging: No results found.   Medications:   . sodium chloride Stopped (08/30/18 1524)  . furosemide 120 mg (09/01/18 2202)   . amiodarone  200 mg Oral BID  . apixaban  5 mg Oral BID  . carvedilol  25 mg Oral BID WC  . feeding supplement (PRO-STAT SUGAR FREE 64)  30 mL Oral BID  . insulin aspart  0-5 Units Subcutaneous QHS  . insulin aspart  0-9 Units Subcutaneous TID WC  . isosorbide-hydrALAZINE  1 tablet Oral TID  . multivitamin with minerals  1 tablet Oral Daily  . silver sulfADIAZINE   Topical Daily  . sodium chloride flush  3 mL Intravenous Q12H   sodium chloride, acetaminophen, oxyCODONE-acetaminophen, sodium chloride  flush  Assessment/ Plan:   Acute on chronic renal insufficiency with stage IV chronic kidney disease secondary to cardiorenal syndrome and type 2 diabetes.  He has biventricular heart failure with an ejection fraction of 20%.  His baseline serum creatinine appears to be stable.  He has chronic diabetic foot ulcers and is followed by orthopedics and will follow up with Dr. Sharol Given as an outpatient.  There is absolutely no need for dialysis at this time he has had tremendous diuresis with IV Lasix and metolazone.  Metolazone was discontinued 09/01/2018.  He has had a  diuresis though this is not reflected by decrease in weight.  Would continue IV Lasix for another 24 hours.  Continue to avoid nonsteroidal inflammatory drugs Cox 2 inhibitors ACE inhibitors and  ARB's.  Continue to avoid  IV contrast.  Hypertension/volume continue IV Lasix at this present time with 120 mg IV every 8 hours  continue dietary sodium restriction.  Follow daily weights.  His home dose of torsemide 60 mg daily.  If we are to convert to oral medications I would convert to torsemide 40 mg twice daily  Anemia this does not appear to be a significant issue at this time  Bones    Diabetes mellitus per primary team  Paroxysmal atrial fibrillation.  Eliquis and amiodarone to be restarted by primary service.  Abnormal thyroid function studies outpatient follow-up for evaluation of thyroid replacement   LOS: 2 Sherril Croon @TODAY @8 :43 AM

## 2018-09-03 LAB — GLUCOSE, CAPILLARY
Glucose-Capillary: 110 mg/dL — ABNORMAL HIGH (ref 70–99)
Glucose-Capillary: 119 mg/dL — ABNORMAL HIGH (ref 70–99)
Glucose-Capillary: 127 mg/dL — ABNORMAL HIGH (ref 70–99)
Glucose-Capillary: 135 mg/dL — ABNORMAL HIGH (ref 70–99)
Glucose-Capillary: 144 mg/dL — ABNORMAL HIGH (ref 70–99)
Glucose-Capillary: 150 mg/dL — ABNORMAL HIGH (ref 70–99)

## 2018-09-03 LAB — BASIC METABOLIC PANEL
Anion gap: 10 (ref 5–15)
BUN: 65 mg/dL — ABNORMAL HIGH (ref 6–20)
CO2: 24 mmol/L (ref 22–32)
Calcium: 8.6 mg/dL — ABNORMAL LOW (ref 8.9–10.3)
Chloride: 104 mmol/L (ref 98–111)
Creatinine, Ser: 3.61 mg/dL — ABNORMAL HIGH (ref 0.61–1.24)
GFR calc Af Amer: 23 mL/min — ABNORMAL LOW (ref >60)
GFR calc non Af Amer: 20 mL/min — ABNORMAL LOW (ref >60)
Glucose, Bld: 137 mg/dL — ABNORMAL HIGH (ref 70–99)
Potassium: 3.6 mmol/L (ref 3.5–5.1)
Sodium: 138 mmol/L (ref 135–145)

## 2018-09-03 LAB — CBC
HEMATOCRIT: 38.5 % — AB (ref 39.0–52.0)
Hemoglobin: 11.7 g/dL — ABNORMAL LOW (ref 13.0–17.0)
MCH: 27.7 pg (ref 26.0–34.0)
MCHC: 30.4 g/dL (ref 30.0–36.0)
MCV: 91.2 fL (ref 80.0–100.0)
NRBC: 0 % (ref 0.0–0.2)
Platelets: 265 10*3/uL (ref 150–400)
RBC: 4.22 MIL/uL (ref 4.22–5.81)
RDW: 18.3 % — ABNORMAL HIGH (ref 11.5–15.5)
WBC: 5.8 10*3/uL (ref 4.0–10.5)

## 2018-09-03 LAB — PARATHYROID HORMONE, INTACT (NO CA): PTH: 45 pg/mL (ref 15–65)

## 2018-09-03 LAB — MAGNESIUM: Magnesium: 2.1 mg/dL (ref 1.7–2.4)

## 2018-09-03 MED ORDER — FUROSEMIDE 10 MG/ML IJ SOLN
160.0000 mg | Freq: Four times a day (QID) | INTRAVENOUS | Status: AC
  Administered 2018-09-03 – 2018-09-04 (×4): 160 mg via INTRAVENOUS
  Filled 2018-09-03: qty 10
  Filled 2018-09-03: qty 16
  Filled 2018-09-03: qty 2
  Filled 2018-09-03 (×2): qty 10

## 2018-09-03 MED ORDER — POTASSIUM CHLORIDE CRYS ER 20 MEQ PO TBCR
40.0000 meq | EXTENDED_RELEASE_TABLET | Freq: Every day | ORAL | Status: DC
  Administered 2018-09-03 – 2018-09-06 (×4): 40 meq via ORAL
  Filled 2018-09-03 (×4): qty 2

## 2018-09-03 NOTE — Progress Notes (Signed)
**Note De-Identified vi Obfusction** PROGRESS NOTE    Joshua Massey  XVQ:[id number] DOB: Dec 04, 1975 DOA: 08/29/2018 PCP: Mrtinique, [name], MD   Brief Nrrtive:  Joshua Massey is  43 y.o. 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  He ws seen by his crdiologist tody for evlution of the symptoms, ws found to be mssively volume overloded, nd directed to the ED for evlution nd mngement of this.  He denies ny chest pin, fevers, or chills.  Assessment & Pln:   Principl Problem:   Acute on chronic combined systolic nd distolic CHF (congestive hert filure) (HCC) Active Problems:   Hypertension   Coronry rtery disese, 50% LAD t cth 02/16/12   Type 2 dibetes mellitus with vsculr disese ([place])   CKD (chronic kidney disese), stge IV (HCC)   Idiopthic chronic venous hypertension of both lower extremities with ulcer nd inflmmtion (HCC)   Subcute osteomyelitis, left nkle nd foot ([place])   Hert filure ([place])   1. Acute on chronic combined CHF  - Presents with incresed peripherl edem, wt-gin, nd SOB  - TTE from June 2019 with EF 15-20%, diffuse HK, grde 2 distolic dysfunction, mild MR, nd severe LAE  - Repet echo EF 20-25%. Distolic dysfunction.  Norml RV function.  Mild spring of contrctile performnce of the bsl inferolterl segment.  See report. - Treted in ED with Lsix 60 mg IV  - he tkes 60 mg torsemide t home, lsix incresed to 160 QID per nephrology.  - Continue coreg, bidil - I/O, dily weights (weight up bout 20 kg since December, ? Since 3/10).  - Lsix incresed per renl.   Apprecite recs.  Weight down tody. - Per HF note, will consult nephrology given his CKD - Diuresis per renl - Outptient crds follow up  Wt Red[place] from Lst 3 Encounters:  09/03/18 (!) 150.7 kg  08/29/18 (!) 158.9 kg  08/28/18 136.1 kg   2. Dibetic foot infection with osteomyelitis   - Ptient hs chronic bilterl foot wounds nd hd debridement on the left in [place]  - Afebrile, now white count - CRP nd ESR wnl  - Plin films of L foot with concern for incresed erosion t bse of first mettrl concerning for worsening osteomyelitis - Seen by Dr. Shrol Given 3/12, recommending compression for both legs, continue kneeling scooter nd follow up in office in 1 week.  Discussed ntibiotics with Dr. Shrol Given nd will pln on holding bx for now (though if hospitliztion prolonged by number 1 my need to reevlute this).   - wound cre per dr. Shrol Given nd wound cre c/s (plnning for twice weekly unn boots to RLE, LLE silvdene to L foot wound - see instructions).  3. CKD IV  - SCr is 3.55 tody, similr to priors (bseline between 3.25-4.73) - Follow closely during diuresis, renlly-dose medictions   - renl consult s noted bove  4. CAD  - No nginl complints  - Reports he ws recently tken off of sttin, will continue bet-blocker    5. Insulin-dependent DM  - A1c ws 8.5% in December  - Check CBG's nd continue SSI with Novolog    6. Hypertension  - BP elevted in  ED, anticipate improvement with diuresis  - Continue Bidil and Coreg   7. Paroxysmal atrial fibrillation  - In sinus rhythm on admission  - CHADS-VASc is 4 (CAD, CHF, DM, HTN)  - Resume eliquis, continue amiodarone   # Abnormal Thyroid Function Tests:  Follow outpatient, follow T4 (wnl)  DVT prophylaxis: eliquis Code Status: full  Family Communication: none at bedside Disposition Plan: pending evaluation by renal.  Volume overloaded, needs IV diuresis and renal evaluation prior to discharge.  Continues  to require inpatient hospitalization.   Consultants:   nephrology  Procedures:  echo IMPRESSIONS    1. The left ventricle has severely reduced systolic function, with an ejection fraction of 20-25%. The cavity size was mildly dilated. There is mildly increased left ventricular wall thickness. Left ventricular diastolic Doppler parameters are  consistent with impaired relaxation. Left ventricular diffuse hypokinesis.  2. There is mild sparing of contractile performance of the basal inferolateral segment.  3. The right ventricle has normal systolic function. The cavity was normal. There is no increase in right ventricular wall thickness.  4. Left atrial size was mild-moderately dilated.  5. Small pericardial effusion.  6. The pericardial effusion is posterior to the left ventricle.  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 Mild thickening of the mitral valve leaflet.  8. Pulmonic valve regurgitation is moderate is mild by color flow Doppler.  9. Pulmonary hypertension is moderate. 10. The inferior vena cava was dilated in size with <50% respiratory variability.  Antimicrobials: Anti-infectives (From admission, onward)   Start     Dose/Rate Route Frequency Ordered Stop   08/30/18 2200  vancomycin (VANCOCIN) 2,000 mg in sodium chloride 0.9 % 500 mL IVPB  Status:  Discontinued     2,000 mg 250 mL/hr over 120 Minutes Intravenous Every 24 hours 08/29/18 2223 08/30/18 1048   08/30/18 0500  ceFEPIme (MAXIPIME) 2 g in sodium chloride 0.9 % 100 mL IVPB  Status:  Discontinued     2 g 200 mL/hr over 30 Minutes Intravenous Every 12 hours 08/29/18 2212 08/30/18 1048   08/29/18 2215  metroNIDAZOLE (FLAGYL) IVPB 500 mg  Status:  Discontinued     500 mg 100 mL/hr over 60 Minutes Intravenous Every 8 hours 08/29/18 2201 08/30/18 1048   08/29/18 2215  vancomycin (VANCOCIN) 2,000 mg in sodium chloride 0.9 % 500 mL IVPB     2,000 mg 250 mL/hr over 120 Minutes Intravenous  Once 08/29/18 2208 08/30/18 0155    08/29/18 2215  ceFEPIme (MAXIPIME) 2 g in sodium chloride 0.9 % 100 mL IVPB  Status:  Discontinued     2 g 200 mL/hr over 30 Minutes Intravenous Every 12 hours 08/29/18 2211 08/29/18 2212   08/29/18 2115  piperacillin-tazobactam (ZOSYN) IVPB 3.375 g     3.375 g 100 mL/hr over 30 Minutes Intravenous  Once 08/29/18 2114 08/29/18 2209   08/29/18 2115  vancomycin (VANCOCIN) IVPB 1000 mg/200 mL premix  Status:  Discontinued     1,000 mg 200 mL/hr over 60 Minutes Intravenous  Once 08/29/18 2114 08/29/18 2208     Subjective: Gradually better, but feels like he's still significantly overloaded.  Objective: Vitals:   09/02/18 0427 09/02/18 1237 09/02/18 2035 09/03/18 0401  BP: (!) 148/108 (!) 136/100 (!) 140/97 (!) 143/101  Pulse: (!) 117 74 70 72  Resp: 18 18    Temp: 97.8 F (36.6 C) (!) 97.3 F (36.3 C) 97.7 F (36.5 C) 98 F (36.7 C)  TempSrc: Oral Oral Oral Oral  SpO2: **Note De-Identified vi Obfusction** 95% 95% 97% 97%  Weight: (!) 152.4 kg   (!) 150.7 kg  Height:        Intke/Output Summry (Lst 24 hours) t 09/03/2018 1258 Lst dt filed t 09/03/2018 1049 Gross per 24 hour  Intke 1840.09 ml  Output 1775 ml  Net 65.09 ml   Filed Weights   09/01/18 0454 09/02/18 0427 09/03/18 0401  Weight: (!) 146.3 kg (!) 152.4 kg (!) 150.7 kg    Exmintion:  Generl: No cute distress. Crdiovsculr: Hert sounds show  regulr rte, nd rhythm Lungs: Cler to usculttion bilterlly  bdomen: Soft, nontender, nondistended  Neurologicl: lert nd oriented 3. Moves ll extremities 4. Crnil nerves II through XII grossly intct. Skin: Wrm nd dry. No rshes or lesions. Extremities: unn boot on R, dressing intct to L.  Edem noted under dressings. Psychitric: Mood nd ffect re norml. Insight nd judgment re pproprite.   Dt Reviewed: I hve personlly reviewed following lbs nd imging studies  CBC: Recent Lbs  Lb 08/30/18 0542 08/31/18 0501 09/01/18 0445 09/02/18 0720 09/03/18 0553   WBC 4.6 5.1 5.4 5.1 5.8  HGB 11.3* 11.5* 11.3* 11.8* 11.7*  HCT 37.8* 37.8* 36.4* 37.4* 38.5*  MCV 92.6 92.4 93.3 90.6 91.2  PLT 251 253 244 260 193   Bsic Metbolic Pnel: Recent Lbs  Lb 08/30/18 0542 08/31/18 0501 09/01/18 0445 09/02/18 0720 09/03/18 0553  N 143 142 140 138 138  K 3.7 3.5 3.6 4.0 3.6  CL 111 108 107 108 104  CO2 22 23 22  21* 24  GLUCOSE 150* 138* 130* 116* 137*  BUN 59* 59* 60* 64* 65*  CRETININE 3.54* 3.74* 3.62* 3.55* 3.61*  CLCIUM 8.1* 8.4* 8.6* 8.4* 8.6*  MG  --   --   --  2.0 2.1   GFR: Estimted Cretinine Clernce: 40.2 mL/min () (by [place] formul bsed on SCr of 3.61 mg/dL (H)). Liver Function Tests: Recent Lbs  Lb 08/29/18 1229  ST 21  LT 16  LKPHOS 87  BILITOT 0.9  PROT 6.6  LBUMIN 2.6*   No results for input(s): LIPSE, MYLSE in the lst 168 hours. No results for input(s): MMONI in the lst 168 hours. Cogultion Profile: No results for input(s): INR, PROTIME in the lst 168 hours. Crdic Enzymes: No results for input(s): CKTOTL, CKMB, CKMBINDEX, TROPONINI in the lst 168 hours. BNP (lst 3 results) No results for input(s): PROBNP in the lst 8760 hours. Hb1C: No results for input(s): HGB1C in the lst 72 hours. CBG: Recent Lbs  Lb 09/02/18 2000 09/03/18 0023 09/03/18 0349 09/03/18 0618 09/03/18 1242  GLUCP 175* 135* 119* 144* 150*   Lipid Profile: No results for input(s): CHOL, HDL, LDLCLC, TRIG, CHOLHDL, LDLDIRECT in the lst 72 hours. Thyroid Function Tests: No results for input(s): TSH, T4TOTL, FREET4, T3FREE, THYROIDB in the lst 72 hours. nemi Pnel: No results for input(s): VITMINB12, FOLTE, FERRITIN, TIBC, IRON, RETICCTPCT in the lst 72 hours. Sepsis Lbs: Recent Lbs  Lb 08/29/18 1738  LTICCIDVEN 1.3    No results found for this or ny previous visit (from the pst 240 hour(s)).       Rdiology Studies: No results found.      Scheduled Meds: . miodrone  200  mg Orl BID  . pixbn  5 mg Orl BID  . crvedilol  25 mg Orl BID WC  . feeding supplement (PRO-STT SUGR FREE 64)  30 mL Orl BID  . insulin sprt  0-5 Units Subcutneous QHS  . insulin  aspart  0-9 Units Subcutaneous TID WC  . isosorbide-hydrALAZINE  1 tablet Oral TID  . multivitamin with minerals  1 tablet Oral Daily  . potassium chloride  40 mEq Oral Daily  . silver sulfADIAZINE   Topical Daily  . sodium chloride flush  3 mL Intravenous Q12H   Continuous Infusions: . sodium chloride Stopped (08/30/18 1524)  . furosemide       LOS: 3 days    Time spent: over 30 min    Fayrene Helper, MD [facility] Pager AMION  If 7PM-7AM, please contact night-coverage [email or web address] Password [facility] 09/03/2018, 12:58 PM

## 2018-09-03 NOTE — Progress Notes (Signed)
Subjective:  Good UOP, not great- does appear to be diuresing by weight on lasix 120  3 times daily- kidney function unchanged  Objective Vital signs in last 24 hours: Vitals:   09/02/18 0427 09/02/18 1237 09/02/18 2035 09/03/18 0401  BP: (!) 148/108 (!) 136/100 (!) 140/97 (!) 143/101  Pulse: (!) 117 74 70 72  Resp: 18 18    Temp: 97.8 F (36.6 C) (!) 97.3 F (36.3 C) 97.7 F (36.5 C) 98 F (36.7 C)  TempSrc: Oral Oral Oral Oral  SpO2: 95% 95% 97% 97%  Weight: (!) 152.4 kg   (!) 150.7 kg  Height:       Weight change: -1.633 kg  Intake/Output Summary (Last 24 hours) at 09/03/2018 0944 Last data filed at 09/03/2018 0900 Gross per 24 hour  Intake 2170.09 ml  Output 2575 ml  Net -404.91 ml    Assessment/ Plan: Pt is a 43 y.o. yo male with DM, HTN, CHF- EF 15-20% and advanced CKD who was admitted on 08/29/2018 with anasarca   Assessment/Plan: 1. Renal- progressive kidney decline over the years- he says only heard at last visit with PCP that he needed to come and see [place], says is willing.   I informed him that his kidney function is only at 25 % and what that means.  No uremic sxms.  I am afraid would not be candidate for kidney transplant due to cardiomyopathy unless he could get a heart/kidney transplant.  He tells me he will follow up with [place] 2. Vol/htn-  Still quite overloaded, he says have made progress.  If I look at heaviest weight to lowest- lost 15 pounds, but I think still a ways to go - I would like to give IV lasix for another 24 hours and increase dose, then change to higher dose torsemide as OP as BP is high/kidney function stable , will tolerate more diuresis  3. Anemia- not a major issue  4. Secondary hyperparathyroidism- will check PTH and phos 5.  Will add on potassium for anticipated lower K   Louis Meckel    Labs: Basic Metabolic Panel: Recent Labs  Lab 09/01/18 0445 09/02/18 0720 09/03/18 0553  NA 140 138 138  K 3.6 4.0 3.6  CL 107 108 104  CO2  22 21* 24  GLUCOSE 130* 116* 137*  BUN 60* 64* 65*  CREATININE 3.62* 3.55* 3.61*  CALCIUM 8.6* 8.4* 8.6*   Liver Function Tests: Recent Labs  Lab 08/29/18 1229  AST 21  ALT 16  ALKPHOS 87  BILITOT 0.9  PROT 6.6  ALBUMIN 2.6*   No results for input(s): LIPASE, AMYLASE in the last 168 hours. No results for input(s): AMMONIA in the last 168 hours. CBC: Recent Labs  Lab 08/30/18 0542 08/31/18 0501 09/01/18 0445 09/02/18 0720 09/03/18 0553  WBC 4.6 5.1 5.4 5.1 5.8  HGB 11.3* 11.5* 11.3* 11.8* 11.7*  HCT 37.8* 37.8* 36.4* 37.4* 38.5*  MCV 92.6 92.4 93.3 90.6 91.2  PLT 251 253 244 260 265   Cardiac Enzymes: No results for input(s): CKTOTAL, CKMB, CKMBINDEX, TROPONINI in the last 168 hours. CBG: Recent Labs  Lab 09/02/18 1642 09/02/18 2000 09/03/18 0023 09/03/18 0349 09/03/18 0618  GLUCAP 161* 175* 135* 119* 144*    Iron Studies: No results for input(s): IRON, TIBC, TRANSFERRIN, FERRITIN in the last 72 hours. Studies/Results: No results found. Medications: Infusions: . sodium chloride Stopped (08/30/18 1524)  . furosemide 120 mg (09/03/18 0175)    Scheduled Medications: . amiodarone  200 mg Oral BID  . apixaban  5 mg Oral BID  . carvedilol  25 mg Oral BID WC  . feeding supplement (PRO-STAT SUGAR FREE 64)  30 mL Oral BID  . insulin aspart  0-5 Units Subcutaneous QHS  . insulin aspart  0-9 Units Subcutaneous TID WC  . isosorbide-hydrALAZINE  1 tablet Oral TID  . multivitamin with minerals  1 tablet Oral Daily  . silver sulfADIAZINE   Topical Daily  . sodium chloride flush  3 mL Intravenous Q12H    have reviewed scheduled and prn medications.  Physical Exam: General: NAD Heart: RRR Lungs: mostly clear Abdomen: obese, non tender Extremities: pitting edema throughout     09/03/2018,9:44 AM  LOS: 3 days

## 2018-09-04 LAB — RENAL FUNCTION PANEL
Albumin: 2.6 g/dL — ABNORMAL LOW (ref 3.5–5.0)
Anion gap: 11 (ref 5–15)
BUN: 69 mg/dL — ABNORMAL HIGH (ref 6–20)
CO2: 25 mmol/L (ref 22–32)
Calcium: 8.6 mg/dL — ABNORMAL LOW (ref 8.9–10.3)
Chloride: 103 mmol/L (ref 98–111)
Creatinine, Ser: 3.69 mg/dL — ABNORMAL HIGH (ref 0.61–1.24)
GFR calc Af Amer: 22 mL/min — ABNORMAL LOW (ref >60)
GFR calc non Af Amer: 19 mL/min — ABNORMAL LOW (ref >60)
Glucose, Bld: 102 mg/dL — ABNORMAL HIGH (ref 70–99)
POTASSIUM: 4 mmol/L (ref 3.5–5.1)
Phosphorus: 4.5 mg/dL (ref 2.5–4.6)
Sodium: 139 mmol/L (ref 135–145)

## 2018-09-04 LAB — CBC
HEMATOCRIT: 37.6 % — AB (ref 39.0–52.0)
HEMOGLOBIN: 11.5 g/dL — AB (ref 13.0–17.0)
MCH: 27.9 pg (ref 26.0–34.0)
MCHC: 30.6 g/dL (ref 30.0–36.0)
MCV: 91.3 fL (ref 80.0–100.0)
Platelets: 230 10*3/uL (ref 150–400)
RBC: 4.12 MIL/uL — ABNORMAL LOW (ref 4.22–5.81)
RDW: 18.1 % — ABNORMAL HIGH (ref 11.5–15.5)
WBC: 4.8 10*3/uL (ref 4.0–10.5)
nRBC: 0 % (ref 0.0–0.2)

## 2018-09-04 LAB — GLUCOSE, CAPILLARY
GLUCOSE-CAPILLARY: 105 mg/dL — AB (ref 70–99)
GLUCOSE-CAPILLARY: 157 mg/dL — AB (ref 70–99)
Glucose-Capillary: 109 mg/dL — ABNORMAL HIGH (ref 70–99)
Glucose-Capillary: 96 mg/dL (ref 70–99)
Glucose-Capillary: 125 mg/dL — ABNORMAL HIGH (ref 70–99)
Glucose-Capillary: 143 mg/dL — ABNORMAL HIGH (ref 70–99)

## 2018-09-04 LAB — MAGNESIUM: Magnesium: 2.2 mg/dL (ref 1.7–2.4)

## 2018-09-04 MED ORDER — FUROSEMIDE 10 MG/ML IJ SOLN
160.0000 mg | Freq: Once | INTRAVENOUS | Status: AC
  Administered 2018-09-04: 160 mg via INTRAVENOUS
  Filled 2018-09-04: qty 10

## 2018-09-04 MED ORDER — TORSEMIDE 100 MG PO TABS
100.0000 mg | ORAL_TABLET | Freq: Every day | ORAL | Status: DC
  Administered 2018-09-05 – 2018-09-06 (×2): 100 mg via ORAL
  Filled 2018-09-04 (×2): qty 1

## 2018-09-04 NOTE — Progress Notes (Signed)
Orthopedic Tech Progress Note Patient Details:  [name] 1975-08-06 [id number]  Ortho Devices Type of Ortho Device: Louretta Parma boot Ortho Device/Splint Interventions: Ordered, Application, Adjustment   Post Interventions Patient Tolerated: Well Instructions Provided: Adjustment of device, Care of device  Patient ID: Joshua Massey, male   DOB: 1976/03/15, 43 y.o.   MRN: [id number]   Carolynn Comment 09/04/2018, 1:52 PM

## 2018-09-04 NOTE — Progress Notes (Signed)
PROGRESS NOTE    Joshua Massey  FAO:[id number] DOB: 06-29-1975 DOA: 08/29/2018 PCP: [name], [name], MD   Brief Narrative:  Joshua Massey is a 43 y.o. 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  He was seen by his cardiologist today for evaluation of the symptoms, was found to be massively volume overloaded, and directed to the ED for evaluation and management of this.  He denies any chest pain, fevers, or chills.  Assessment & Plan:   Principal Problem:   Acute on chronic combined systolic and diastolic CHF (congestive heart failure) (HCC) Active Problems:   Hypertension   Coronary artery disease, 50% LAD at cath 02/16/12   Type 2 diabetes mellitus with vascular disease ([place])   CKD (chronic kidney disease), stage IV (HCC)   Idiopathic chronic venous hypertension of both lower extremities with ulcer and inflammation (HCC)   Subacute osteomyelitis, left ankle and foot ([place])   Heart failure ([place])   1. Acute on chronic combined CHF  - Presents with increased peripheral edema, wt-gain, and SOB  - TTE from June 2019 with EF 15-20%, diffuse HK, grade 2 diastolic dysfunction, mild MR, and severe LAE  - Repeat echo EF 20-25%. Diastolic dysfunction.  Normal RV function.  Mild sparing of contractile performance of the basal inferolateral segment.  See report. - Treated in ED with Lasix 60 mg IV  - he takes 60 mg torsemide at home, lasix increased to 160 QID per nephrology.  - Continue coreg, bidil - I/O, daily weights (weight up about 20 kg since December, ? Since 3/10).  - Lasix increased per renal.   Appreciate recs.  Weight down today. - Planning for increased dose of torsemide tomorrow per renal, 100 mg daily. - Per HF note, will consult nephrology given his CKD - Diuresis per renal - Outpatient cards follow up  Wt Readings from Last 3 Encounters:  09/04/18 (!) 148.6 kg  08/29/18 (!) 158.9 kg  08/28/18 136.1 kg   2. Diabetic foot infection with osteomyelitis   - Patient has chronic bilateral foot wounds and had debridement on the left in January  - Afebrile, now white count - CRP and ESR wnl  - Plain films of L foot with concern for increased erosion at base of first metataral concerning for worsening osteomyelitis - Seen by Dr. Sharol Given 3/12, recommending compression for both legs, continue kneeling scooter and follow up in office in 1 week.  Discussed antibiotics with Dr. Sharol Given and will plan on holding abx for now (though if hospitalization prolonged by number 1 may need to reevaluate this).   - wound care per dr. Sharol Given and wound care c/s (planning for twice weekly unna boots to RLE, LLE silvadene to L foot wound - see instructions).  3. CKD IV  - SCr is 3.69 today, similar to priors (baseline between 3.25-4.73) - Follow closely during diuresis, renally-dose medications   - renal consult as noted above  4. CAD  - No anginal complaints  - Reports he was recently taken off of statin, will continue beta-blocker    5. Insulin-dependent DM  - A1c was 8.5% in December  - Check CBG's and continue  SSI with Novolog    6. Hypertension  - BP elevated in ED, anticipate improvement with diuresis  - Continue Bidil and Coreg   7. Paroxysmal atrial fibrillation  - In sinus rhythm on admission  - CHADS-VASc is 4 (CAD, CHF, DM, HTN)  - Resume eliquis, continue amiodarone   # Abnormal Thyroid Function Tests:  Follow outpatient, follow T4 (wnl)  DVT prophylaxis: eliquis Code Status: full  Family Communication: none at bedside Disposition Plan: pending evaluation by renal.  Volume  overloaded, needs IV diuresis and renal evaluation prior to discharge.  Continues to require inpatient hospitalization.   Consultants:   nephrology  Procedures:  echo IMPRESSIONS    1. The left ventricle has severely reduced systolic function, with an ejection fraction of 20-25%. The cavity size was mildly dilated. There is mildly increased left ventricular wall thickness. Left ventricular diastolic Doppler parameters are  consistent with impaired relaxation. Left ventricular diffuse hypokinesis.  2. There is mild sparing of contractile performance of the basal inferolateral segment.  3. The right ventricle has normal systolic function. The cavity was normal. There is no increase in right ventricular wall thickness.  4. Left atrial size was mild-moderately dilated.  5. Small pericardial effusion.  6. The pericardial effusion is posterior to the left ventricle.  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 Mild thickening of the mitral valve leaflet.  8. Pulmonic valve regurgitation is moderate is mild by color flow Doppler.  9. Pulmonary hypertension is moderate. 10. The inferior vena cava was dilated in size with <50% respiratory variability.  Antimicrobials: Anti-infectives (From admission, onward)   Start     Dose/Rate Route Frequency Ordered Stop   08/30/18 2200  vancomycin (VANCOCIN) 2,000 mg in sodium chloride 0.9 % 500 mL IVPB  Status:  Discontinued     2,000 mg 250 mL/hr over 120 Minutes Intravenous Every 24 hours 08/29/18 2223 08/30/18 1048   08/30/18 0500  ceFEPIme (MAXIPIME) 2 g in sodium chloride 0.9 % 100 mL IVPB  Status:  Discontinued     2 g 200 mL/hr over 30 Minutes Intravenous Every 12 hours 08/29/18 2212 08/30/18 1048   08/29/18 2215  metroNIDAZOLE (FLAGYL) IVPB 500 mg  Status:  Discontinued     500 mg 100 mL/hr over 60 Minutes Intravenous Every 8 hours 08/29/18 2201 08/30/18 1048   08/29/18 2215  vancomycin (VANCOCIN) 2,000 mg in sodium chloride 0.9 % 500 mL IVPB      2,000 mg 250 mL/hr over 120 Minutes Intravenous  Once 08/29/18 2208 08/30/18 0155   08/29/18 2215  ceFEPIme (MAXIPIME) 2 g in sodium chloride 0.9 % 100 mL IVPB  Status:  Discontinued     2 g 200 mL/hr over 30 Minutes Intravenous Every 12 hours 08/29/18 2211 08/29/18 2212   08/29/18 2115  piperacillin-tazobactam (ZOSYN) IVPB 3.375 g     3.375 g 100 mL/hr over 30 Minutes Intravenous  Once 08/29/18 2114 08/29/18 2209   08/29/18 2115  vancomycin (VANCOCIN) IVPB 1000 mg/200 mL premix  Status:  Discontinued     1,000 mg 200 mL/hr over 60 Minutes Intravenous  Once 08/29/18 2114 08/29/18 2208     Subjective: Feels ok.  Feels weight is better.  Objective: Vitals:   09/03/18 2042 09/04/18 0453 09/04/18 0809 09/04/18 1355  BP: (!) 137/108 (!) 143/97 124/87 (!) 144/101  Pulse: 71 70 71 72  Resp: 18 13  18   Temp: 97.8 F (36.6 C) 98.2 F (36.8 C)  98.8 F (37.1 C)  TempSrc: Oral  Oral  Oral  SpO2: 96% 98%  94%  Weight:  (!) 148.6 kg    Height:        Intake/Output Summary (Last 24 hours) at 09/04/2018 1752 Last data filed at 09/04/2018 1704 Gross per 24 hour  Intake 1106 ml  Output 3303 ml  Net -2197 ml   Filed Weights   09/02/18 0427 09/03/18 0401 09/04/18 0453  Weight: (!) 152.4 kg (!) 150.7 kg (!) 148.6 kg    Examination:  General: No acute distress. Cardiovascular: Heart sounds show a regular rate, and rhythm.  Lungs: Clear to auscultation bilaterally Abdomen: Soft, nontender, nondistended Neurological: Alert and oriented 3. Moves all extremities 4. Cranial nerves II through XII grossly intact. Skin: Warm and dry. No rashes or lesions. Extremities: unna boot on R.  L with dressing intact, wound to plantar surface without significant drainage Psychiatric: Mood and affect are normal. Insight and judgment are appropriate.  Data Reviewed: I have personally reviewed following labs and imaging studies  CBC: Recent Labs  Lab 08/31/18 0501 09/01/18 0445 09/02/18 0720  09/03/18 0553 09/04/18 0414  WBC 5.1 5.4 5.1 5.8 4.8  HGB 11.5* 11.3* 11.8* 11.7* 11.5*  HCT 37.8* 36.4* 37.4* 38.5* 37.6*  MCV 92.4 93.3 90.6 91.2 91.3  PLT 253 244 260 265 169   Basic Metabolic Panel: Recent Labs  Lab 08/31/18 0501 09/01/18 0445 09/02/18 0720 09/03/18 0553 09/04/18 0414  NA 142 140 138 138 139  K 3.5 3.6 4.0 3.6 4.0  CL 108 107 108 104 103  CO2 23 22 21* 24 25  GLUCOSE 138* 130* 116* 137* 102*  BUN 59* 60* 64* 65* 69*  CREATININE 3.74* 3.62* 3.55* 3.61* 3.69*  CALCIUM 8.4* 8.6* 8.4* 8.6* 8.6*  MG  --   --  2.0 2.1 2.2  PHOS  --   --   --   --  4.5   GFR: Estimated Creatinine Clearance: 39 mL/min (A) (by C-G formula based on SCr of 3.69 mg/dL (H)). Liver Function Tests: Recent Labs  Lab 08/29/18 1229 09/04/18 0414  AST 21  --   ALT 16  --   ALKPHOS 87  --   BILITOT 0.9  --   PROT 6.6  --   ALBUMIN 2.6* 2.6*   No results for input(s): LIPASE, AMYLASE in the last 168 hours. No results for input(s): AMMONIA in the last 168 hours. Coagulation Profile: No results for input(s): INR, PROTIME in the last 168 hours. Cardiac Enzymes: No results for input(s): CKTOTAL, CKMB, CKMBINDEX, TROPONINI in the last 168 hours. BNP (last 3 results) No results for input(s): PROBNP in the last 8760 hours. HbA1C: No results for input(s): HGBA1C in the last 72 hours. CBG: Recent Labs  Lab 09/04/18 0105 09/04/18 0507 09/04/18 0802 09/04/18 1154 09/04/18 1624  GLUCAP 109* 96 105* 125* 143*   Lipid Profile: No results for input(s): CHOL, HDL, LDLCALC, TRIG, CHOLHDL, LDLDIRECT in the last 72 hours. Thyroid Function Tests: No results for input(s): TSH, T4TOTAL, FREET4, T3FREE, THYROIDAB in the last 72 hours. Anemia Panel: No results for input(s): VITAMINB12, FOLATE, FERRITIN, TIBC, IRON, RETICCTPCT in the last 72 hours. Sepsis Labs: Recent Labs  Lab 08/29/18 1738  LATICACIDVEN 1.3    No results found for this or any previous visit (from the past 240  hour(s)).       Radiology Studies: No results found.      Scheduled Meds: . amiodarone  200 mg Oral BID  . apixaban  5 mg Oral  BID  . carvedilol  25 mg Oral BID WC  . feeding supplement (PRO-STAT SUGAR FREE 64)  30 mL Oral BID  . insulin aspart  0-5 Units Subcutaneous QHS  . insulin aspart  0-9 Units Subcutaneous TID WC  . isosorbide-hydrALAZINE  1 tablet Oral TID  . multivitamin with minerals  1 tablet Oral Daily  . potassium chloride  40 mEq Oral Daily  . silver sulfADIAZINE   Topical Daily  . sodium chloride flush  3 mL Intravenous Q12H  . [START ON 09/05/2018] torsemide  100 mg Oral Daily   Continuous Infusions: . sodium chloride 250 mL (09/04/18 1413)  . furosemide 160 mg (09/04/18 1414)     LOS: 4 days    Time spent: over 72 min    Fayrene Helper, MD [facility] Pager AMION  If 7PM-7AM, please contact night-coverage [email or web address] Password [facility] 09/04/2018, 5:52 PM

## 2018-09-04 NOTE — Progress Notes (Signed)
Subjective:  better UOP, total of 3800 - kidney function pretty stable   Objective Vital signs in last 24 hours: Vitals:   09/03/18 1340 09/03/18 2042 09/04/18 0453 09/04/18 0809  BP: (!) 129/94 (!) 137/108 (!) 143/97 124/87  Pulse: 69 71 70 71  Resp: 18 18 13    Temp: 97.7 F (36.5 C) 97.8 F (36.6 C) 98.2 F (36.8 C)   TempSrc: Oral Oral Oral   SpO2: 100% 96% 98%   Weight:   (!) 148.6 kg   Height:       Weight change: -2.087 kg  Intake/Output Summary (Last 24 hours) at 09/04/2018 1700 Last data filed at 09/04/2018 0805 Gross per 24 hour  Intake 1206 ml  Output 3401 ml  Net -2195 ml    Assessment/ Plan: Pt is a 43 y.o. yo male with DM, HTN, CHF- EF 15-20% and advanced CKD who was admitted on 08/29/2018 with anasarca   Assessment/Plan: 1. Renal- progressive kidney decline over the years- he says only heard at last visit with PCP that he needed to come and see [place], says is willing.   I informed him that his kidney function is only at 25 % and what that means.  No uremic sxms.  I am afraid would not be candidate for kidney transplant due to cardiomyopathy unless he could get a heart/kidney transplant.  He tells me he will follow up with [place] 2. Vol/htn-  Still quite overloaded, he says have made progress. Did make more progress overnight, will change to higher dose torsemide- was on 60 daily will change to 100 daily starting tomorrow and stop lasix this evening as  BP is not low/kidney function stable , should tolerate more diuresis.  If BP drops more, I would elect to decrease bidil instead of decreasing diuretics.    3. Anemia- not a major issue  4. Secondary hyperparathyroidism- will check PTH (pending) and phos (OK) 5.  Have added on potassium for anticipated lower K - is stable   Louis Meckel    Labs: Basic Metabolic Panel: Recent Labs  Lab 09/02/18 0720 09/03/18 0553 09/04/18 0414  NA 138 138 139  K 4.0 3.6 4.0  CL 108 104 103  CO2 21* 24 25  GLUCOSE 116* 137*  102*  BUN 64* 65* 69*  CREATININE 3.55* 3.61* 3.69*  CALCIUM 8.4* 8.6* 8.6*  PHOS  --   --  4.5   Liver Function Tests: Recent Labs  Lab 08/29/18 1229 09/04/18 0414  AST 21  --   ALT 16  --   ALKPHOS 87  --   BILITOT 0.9  --   PROT 6.6  --   ALBUMIN 2.6* 2.6*   No results for input(s): LIPASE, AMYLASE in the last 168 hours. No results for input(s): AMMONIA in the last 168 hours. CBC: Recent Labs  Lab 08/31/18 0501 09/01/18 0445 09/02/18 0720 09/03/18 0553 09/04/18 0414  WBC 5.1 5.4 5.1 5.8 4.8  HGB 11.5* 11.3* 11.8* 11.7* 11.5*  HCT 37.8* 36.4* 37.4* 38.5* 37.6*  MCV 92.4 93.3 90.6 91.2 91.3  PLT 253 244 260 265 230   Cardiac Enzymes: No results for input(s): CKTOTAL, CKMB, CKMBINDEX, TROPONINI in the last 168 hours. CBG: Recent Labs  Lab 09/03/18 1746 09/03/18 2130 09/04/18 0105 09/04/18 0507 09/04/18 0802  GLUCAP 127* 110* 109* 96 105*    Iron Studies: No results for input(s): IRON, TIBC, TRANSFERRIN, FERRITIN in the last 72 hours. Studies/Results: No results found. Medications: Infusions: . sodium chloride  250 mL (09/04/18 0457)  . furosemide 160 mg (09/04/18 0500)    Scheduled Medications: . amiodarone  200 mg Oral BID  . apixaban  5 mg Oral BID  . carvedilol  25 mg Oral BID WC  . feeding supplement (PRO-STAT SUGAR FREE 64)  30 mL Oral BID  . insulin aspart  0-5 Units Subcutaneous QHS  . insulin aspart  0-9 Units Subcutaneous TID WC  . isosorbide-hydrALAZINE  1 tablet Oral TID  . multivitamin with minerals  1 tablet Oral Daily  . potassium chloride  40 mEq Oral Daily  . silver sulfADIAZINE   Topical Daily  . sodium chloride flush  3 mL Intravenous Q12H    have reviewed scheduled and prn medications.  Physical Exam: General: NAD Heart: RRR Lungs: mostly clear Abdomen: obese, non tender Extremities: pitting edema throughout     09/04/2018,9:42 AM  LOS: 4 days

## 2018-09-04 NOTE — Progress Notes (Addendum)
Pt has IV piggyback Lasixs due Q6hr. RN checked both medication rooms and was not able to find medication. RN paged pharmacy @1205  and they replied that they sent it up. Rn looked again and could not find medication. RN paged pharmacy again @ 1:28pm. Pharmacy sent up medication between the time of the second page and 2pm. This medication was due 11am. It is now @7 :40 and we still have not received the dose for 5:00pm. Pharmacy paged again. MD notified. Awaiting medications and orders.

## 2018-09-04 NOTE — Progress Notes (Signed)
Patient did not get his 1800 dose on 09/03/2018 because it was moved to 2200. Pharmacist was called and he cancelled the order and start a new order of IVPB lasix starting at 2300.

## 2018-09-05 LAB — RENAL FUNCTION PANEL
Albumin: 2.8 g/dL — ABNORMAL LOW (ref 3.5–5.0)
Anion gap: 8 (ref 5–15)
BUN: 72 mg/dL — ABNORMAL HIGH (ref 6–20)
CO2: 30 mmol/L (ref 22–32)
Calcium: 8.6 mg/dL — ABNORMAL LOW (ref 8.9–10.3)
Chloride: 100 mmol/L (ref 98–111)
Creatinine, Ser: 3.65 mg/dL — ABNORMAL HIGH (ref 0.61–1.24)
GFR calc Af Amer: 23 mL/min — ABNORMAL LOW (ref >60)
GFR calc non Af Amer: 19 mL/min — ABNORMAL LOW (ref >60)
Glucose, Bld: 121 mg/dL — ABNORMAL HIGH (ref 70–99)
Phosphorus: 4.2 mg/dL (ref 2.5–4.6)
Potassium: 4.3 mmol/L (ref 3.5–5.1)
Sodium: 138 mmol/L (ref 135–145)

## 2018-09-05 LAB — PARATHYROID HORMONE, INTACT (NO CA): PTH: 52 pg/mL (ref 15–65)

## 2018-09-05 LAB — GLUCOSE, CAPILLARY
Glucose-Capillary: 127 mg/dL — ABNORMAL HIGH (ref 70–99)
Glucose-Capillary: 133 mg/dL — ABNORMAL HIGH (ref 70–99)
Glucose-Capillary: 192 mg/dL — ABNORMAL HIGH (ref 70–99)
Glucose-Capillary: 105 mg/dL — ABNORMAL HIGH (ref 70–99)

## 2018-09-05 LAB — CBC
HCT: 36.6 % — ABNORMAL LOW (ref 39.0–52.0)
Hemoglobin: 11.8 g/dL — ABNORMAL LOW (ref 13.0–17.0)
MCH: 29 pg (ref 26.0–34.0)
MCHC: 32.2 g/dL (ref 30.0–36.0)
MCV: 89.9 fL (ref 80.0–100.0)
Platelets: 262 10*3/uL (ref 150–400)
RBC: 4.07 MIL/uL — ABNORMAL LOW (ref 4.22–5.81)
RDW: 18.1 % — ABNORMAL HIGH (ref 11.5–15.5)
WBC: 5.3 10*3/uL (ref 4.0–10.5)
nRBC: 0 % (ref 0.0–0.2)

## 2018-09-05 LAB — MAGNESIUM: Magnesium: 2.2 mg/dL (ref 1.7–2.4)

## 2018-09-05 MED ORDER — ISOSORB DINITRATE-HYDRALAZINE 20-37.5 MG PO TABS
1.0000 | ORAL_TABLET | Freq: Two times a day (BID) | ORAL | Status: DC
  Administered 2018-09-05 – 2018-09-06 (×2): 1 via ORAL
  Filled 2018-09-05 (×2): qty 1

## 2018-09-05 NOTE — Progress Notes (Signed)
Nutrition Follow-up  DOCUMENTATION CODES:   Morbid obesity  INTERVENTION:   -Continue 30 ml Prostat BID, each supplement provides 100 kcals and 15 grams protein -Continue MVI with minerals daily  NUTRITION DIAGNOSIS:   Increased nutrient needs related to wound healing(DM ulcers on both L&R feet) as evidenced by estimated needs.  Ongoing  GOAL:   Patient will meet greater than or equal to 90% of their needs  Progressing  MONITOR:   PO intake, Supplement acceptance, Diet advancement, Weight trends, Labs, I & O's  REASON FOR ASSESSMENT:   Consult Diet education(Will also conduct full assessment )  ASSESSMENT:   Pt is a 30y M with PMH significant for CKD stage IV, T2DM, HTN, chronic combined systolic and diastolic CHF with EF 30-94%, paroxysmal A. Fib (Eliquis) and DM foot wounds with osteomyelitis s/p debridement of the left foot in Jan 2020. Pt presenting to ED for increased leg swelling, weight gain, and SOB. Pt admitted for volume overloaded and Acute on Chronic combined systolic and diastolic CHF.   Reviewed I/O's: -1.8 L x 24 hours and -11.4 L since admission  UOP: +2.5 L x 24 hours  Case discussed with RN prior to visit, who reports pt is making good progress. He has a great appetite. Noted meal completion 75-100%. RN reports that pt has not taken this morning's dose of Prostat yet (per [facility], pt has only refused one dose thus far).   Pt sleeping soundly at time of visit and did not arouse to voice. Noted packet of Prostat on tray table.   Per cardiology notes, plan to transition to IV tuorsemide today.   Labs reviewed: K, Mg, and Phos WDL. CBGS: 125-157 (inpatient orders for glycemic control are 0-5 units insulin aspart q HS and0-9 units insulin aspart TID with meals).   Diet Order:   Diet Order            Diet heart healthy/carb modified Room service appropriate? Yes; Fluid consistency: Thin; Fluid restriction: 1200 mL Fluid  Diet effective now               EDUCATION NEEDS:   Education needs have been addressed(See Ed. part of note for details)  Skin:  Skin Assessment: Skin Integrity Issues: Skin Integrity Issues:: Diabetic Ulcer Diabetic Ulcer: Diabetic ulcer Foot Left (Posterior); Toe (Right); Venous stasis ulcer R Leg.   Last BM:  09/03/18  Height:   Ht Readings from Last 1 Encounters:  08/29/18 5\' 11"  (1.803 m)    Weight:   Wt Readings from Last 1 Encounters:  09/05/18 (!) 145.6 kg    Ideal Body Weight:  78.2 kg  BMI:  Body mass index is 44.77 kg/m.  Estimated Nutritional Needs:   Kcal:  2200-2400  Protein:  115-130 grams  Fluid:  1.2L Restriction or MD recommendation    Saxon Crosby A. Jimmye Norman, RD, LDN, [place] Registered Dietitian II Certified Diabetes Care and Education Specialist Pager: [phone] After hours Pager: [phone]

## 2018-09-05 NOTE — Progress Notes (Signed)
Subjective:   UOP 2500- kidney function pretty stable - changing to oral torsemide today   Objective Vital signs in last 24 hours: Vitals:   09/04/18 0809 09/04/18 1355 09/04/18 2023 09/05/18 0557  BP: 124/87 (!) 144/101 (!) 126/98 138/87  Pulse: 71 72 69 70  Resp:  18 18 18   Temp:  98.8 F (37.1 C) 97.9 F (36.6 C) 98.7 F (37.1 C)  TempSrc:  Oral Oral Oral  SpO2:  94% 100% 96%  Weight:    (!) 145.6 kg  Height:       Weight change: -3.039 kg  Intake/Output Summary (Last 24 hours) at 09/05/2018 0936 Last data filed at 09/05/2018 0500 Gross per 24 hour  Intake 493 ml  Output 2102 ml  Net -1609 ml    Assessment/ Plan: Pt is a 43 y.o. yo male with DM, HTN, CHF- EF 15-20% and advanced CKD who was admitted on 08/29/2018 with anasarca   Assessment/Plan: 1. Renal- progressive kidney decline over the years- he says only heard at last visit with PCP that he needed to come and see [place], says is willing.   I informed him that his kidney function is only at 25 % and what that means.  No uremic sxms.  I am afraid would not be candidate for kidney transplant due to cardiomyopathy unless he could get a heart/kidney transplant.  He tells me he will follow up with [place] 2. Vol/htn-  Still quite overloaded, he says have made progress.  changing to higher dose torsemide- was on 60 daily will change to 100 daily starting today and stop lasix this evening as BP is not low/kidney function stable , should tolerate more diuresis.  If BP drops more, I would elect to decrease bidil instead of decreasing diuretics.  In fact will decrease bidil to BID.  This will need to be followed closely as OP   3. Anemia- not a major issue  4. Secondary hyperparathyroidism- will check PTH (pending) and phos ([place]) 5.  Have added on potassium for anticipated lower K - is stable  6.  If reasonable UOP on torsemide 100 and no major change in renal function ,  I would be comfortable with discharge tomorrow and follow up with [place] and  cards- have told him he needs to weight himself daily    Louis Meckel    Labs: Basic Metabolic Panel: Recent Labs  Lab 09/03/18 0553 09/04/18 0414 09/05/18 0332  NA 138 139 138  K 3.6 4.0 4.3  CL 104 103 100  CO2 24 25 30   GLUCOSE 137* 102* 121*  BUN 65* 69* 72*  CREATININE 3.61* 3.69* 3.65*  CALCIUM 8.6* 8.6* 8.6*  PHOS  --  4.5 4.2   Liver Function Tests: Recent Labs  Lab 08/29/18 1229 09/04/18 0414 09/05/18 0332  AST 21  --   --   ALT 16  --   --   ALKPHOS 87  --   --   BILITOT 0.9  --   --   PROT 6.6  --   --   ALBUMIN 2.6* 2.6* 2.8*   No results for input(s): LIPASE, AMYLASE in the last 168 hours. No results for input(s): AMMONIA in the last 168 hours. CBC: Recent Labs  Lab 09/01/18 0445 09/02/18 0720 09/03/18 0553 09/04/18 0414 09/05/18 0332  WBC 5.4 5.1 5.8 4.8 5.3  HGB 11.3* 11.8* 11.7* 11.5* 11.8*  HCT 36.4* 37.4* 38.5* 37.6* 36.6*  MCV 93.3 90.6 91.2 91.3 89.9  PLT  244 260 265 230 262   Cardiac Enzymes: No results for input(s): CKTOTAL, CKMB, CKMBINDEX, TROPONINI in the last 168 hours. CBG: Recent Labs  Lab 09/04/18 0802 09/04/18 1154 09/04/18 1624 09/04/18 2023 09/05/18 0602  GLUCAP 105* 125* 143* 157* 127*    Iron Studies: No results for input(s): IRON, TIBC, TRANSFERRIN, FERRITIN in the last 72 hours. Studies/Results: No results found. Medications: Infusions: . sodium chloride 250 mL (09/04/18 1413)    Scheduled Medications: . amiodarone  200 mg Oral BID  . apixaban  5 mg Oral BID  . carvedilol  25 mg Oral BID WC  . feeding supplement (PRO-STAT SUGAR FREE 64)  30 mL Oral BID  . insulin aspart  0-5 Units Subcutaneous QHS  . insulin aspart  0-9 Units Subcutaneous TID WC  . isosorbide-hydrALAZINE  1 tablet Oral TID  . multivitamin with minerals  1 tablet Oral Daily  . potassium chloride  40 mEq Oral Daily  . silver sulfADIAZINE   Topical Daily  . sodium chloride flush  3 mL Intravenous Q12H  . torsemide  100 mg  Oral Daily    have reviewed scheduled and prn medications.  Physical Exam: General: NAD Heart: RRR Lungs: mostly clear Abdomen: obese, non tender Extremities: pitting edema throughout     09/05/2018,9:36 AM  LOS: 5 days

## 2018-09-05 NOTE — Progress Notes (Signed)
PROGRESS NOTE    Joshua Massey  XIH:[id number] DOB: 1975-10-18 DOA: 08/29/2018 PCP: [name], [name], MD   Brief Narrative:  Joshua Massey is a 43 y.o. 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  He was seen by his cardiologist today for evaluation of the symptoms, was found to be massively volume overloaded, and directed to the ED for evaluation and management of this.  He denies any chest pain, fevers, or chills.  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 Acute on chronic combined CHF  - Presented with increased peripheral edema, wt-gain, and SOB  - TTE from June 2019 with EF 15-20%, diffuse HK, grade 2 diastolic dysfunction, mild MR, and severe LAE  - Repeat echo EF 20-25%. Diastolic dysfunction.  Normal RV function.  Mild sparing of contractile performance of the basal inferolateral segment.  See report. -Due to his renal dysfunction nephrology was also consulted. - Continue coreg, bidil - I/O, daily weights (weight up about 20 kg since December, ? Since 3/10).  -Patient was transitioned from furosemide to torsemide.  Dose of torsemide increased to  100 mg daily.  His weight appears to be decreasing.  Nephrology recommends discharge tomorrow if he continues to have good urine output with stable renal function. - Outpatient cards follow up  Wt Readings from Last 3 Encounters:  09/05/18 (!) 145.6 kg  08/29/18 (!) 158.9 kg  08/28/18 136.1 kg   2. Diabetic foot infection with osteomyelitis   - Patient has chronic bilateral foot wounds and had debridement on the left in January  - Afebrile, now white count - CRP and ESR wnl  - Plain films of L foot with concern for increased erosion at base of first metataral concerning for worsening osteomyelitis - Seen by Dr. Sharol Given 3/12, recommending compression for both legs, continue kneeling scooter and follow up in office in 1 week.  Discussed antibiotics with Dr. Sharol Given and will plan on holding abx for now (though if hospitalization prolonged by number 1 may need to reevaluate this).   - wound care per dr. Sharol Given and wound care c/s (planning for twice weekly unna boots to RLE, LLE silvadene to L foot wound - see instructions).  He will likely need home health.  3. CKD IV  - baseline between 3.25-4.73 - Renal function has been stable the last 2 to 3 days.  Nephrology is following.    4. CAD  - No anginal complaints  - Reports he was recently taken off of statin, will continue beta-blocker    5. Insulin-dependent DM  - A1c was 8.5% in December  - CBGs are reasonably well controlled.  Continue SSI.  6.   Essential hypertension  - Blood  pressure is reasonably well controlled.  Continue current medications.  7. Paroxysmal atrial fibrillation  - In sinus rhythm on admission  - CHADS-VASc is 4 (CAD, CHF, DM, HTN)  -Continue Eliquis.  He is also on amiodarone.  # Abnormal Thyroid Function Tests:   TSH was noted to be 5.9.  Free T4 normal at 1.32.  Outpatient evaluation.    DVT prophylaxis: Eliquis Code Status: Full code Family Communication: Discussed with the patient Disposition Plan: Seems  to be improving.  He is diuresing well.  Discharge when cleared by nephrology.  Mobilize.     Consultants:   nephrology  Procedures:  Echo IMPRESSIONS    1. The left ventricle has severely reduced systolic function, with an ejection fraction of 20-25%. The cavity size was mildly dilated. There is mildly increased left ventricular wall thickness. Left ventricular diastolic Doppler parameters are  consistent with impaired relaxation. Left ventricular diffuse hypokinesis.  2. There is mild sparing of contractile performance of the basal inferolateral segment.  3. The right ventricle has normal systolic function. The cavity was normal. There is no increase in right ventricular wall thickness.  4. Left atrial size was mild-moderately dilated.  5. Small pericardial effusion.  6. The pericardial effusion is posterior to the left ventricle.  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 Mild thickening of the mitral valve leaflet.  8. Pulmonic valve regurgitation is moderate is mild by color flow Doppler.  9. Pulmonary hypertension is moderate. 10. The inferior vena cava was dilated in size with <50% respiratory variability.  Antimicrobials: Anti-infectives (From admission, onward)   Start     Dose/Rate Route Frequency Ordered Stop   08/30/18 2200  vancomycin (VANCOCIN) 2,000 mg in sodium chloride 0.9 % 500 mL IVPB  Status:  Discontinued     2,000 mg 250 mL/hr over 120 Minutes Intravenous Every 24 hours 08/29/18 2223 08/30/18 1048   08/30/18 0500  ceFEPIme (MAXIPIME) 2 g in sodium chloride 0.9 % 100 mL IVPB  Status:  Discontinued     2 g 200 mL/hr over 30 Minutes Intravenous Every 12 hours 08/29/18 2212 08/30/18 1048   08/29/18 2215  metroNIDAZOLE (FLAGYL) IVPB 500 mg  Status:  Discontinued     500 mg 100 mL/hr over 60 Minutes Intravenous Every 8 hours 08/29/18 2201 08/30/18 1048   08/29/18 2215  vancomycin (VANCOCIN) 2,000 mg in sodium chloride 0.9 % 500 mL IVPB     2,000 mg 250 mL/hr over 120  Minutes Intravenous  Once 08/29/18 2208 08/30/18 0155   08/29/18 2215  ceFEPIme (MAXIPIME) 2 g in sodium chloride 0.9 % 100 mL IVPB  Status:  Discontinued     2 g 200 mL/hr over 30 Minutes Intravenous Every 12 hours 08/29/18 2211 08/29/18 2212   08/29/18 2115  piperacillin-tazobactam (ZOSYN) IVPB 3.375 g     3.375 g 100 mL/hr over 30 Minutes Intravenous  Once 08/29/18 2114 08/29/18 2209   08/29/18 2115  vancomycin (VANCOCIN) IVPB 1000 mg/200 mL premix  Status:  Discontinued     1,000 mg 200 mL/hr over 60 Minutes Intravenous  Once 08/29/18 2114 08/29/18 2208     Subjective: States that he is feeling well.  Denies any shortness of breath or chest pain.  Objective: Vitals:   09/04/18 0809 09/04/18 1355 09/04/18 2023 09/05/18 0557  BP: 124/87 (!) 144/101 (!) 126/98 138/87  Pulse: 71 72 69 70  Resp:  _0 Temp:  98.8 F (37.1 C) 97.9 F (36.6 C) 98.7 F (37.1  C)  TempSrc:  Oral Oral Oral  SpO2:  94% 100% 96%  Weight:    (!) 145.6 kg  Height:        Intake/Output Summary (Last 24 hours) at 09/05/2018 1001 Last data filed at 09/05/2018 0500 Gross per 24 hour  Intake 493 ml  Output 2102 ml  Net -1609 ml   Filed Weights   09/03/18 0401 09/04/18 0453 09/05/18 0557  Weight: (!) 150.7 kg (!) 148.6 kg (!) 145.6 kg    Examination:  General appearance: Awake alert.  In no distress Resp: Clear to auscultation bilaterally.  Normal effort Cardio: S1-S2 is normal regular.  No S3-S4.  No rubs murmurs or bruit GI: Abdomen is soft.  Nontender nondistended.  Bowel sounds are present normal.  No masses organomegaly Extremities: Edema noted bilateral lower extremity 1-2+.  Covered with dressing and Unna boot. Neurologic: Alert and oriented x3.  No focal neurological deficits.     Data Reviewed: I have personally reviewed following labs and imaging studies  CBC: Recent Labs  Lab 09/01/18 0445 09/02/18 0720 09/03/18 0553 09/04/18 0414 09/05/18 0332  WBC 5.4 5.1 5.8 4.8 5.3    HGB 11.3* 11.8* 11.7* 11.5* 11.8*  HCT 36.4* 37.4* 38.5* 37.6* 36.6*  MCV 93.3 90.6 91.2 91.3 89.9  PLT 244 260 265 230 017   Basic Metabolic Panel: Recent Labs  Lab 09/01/18 0445 09/02/18 0720 09/03/18 0553 09/04/18 0414 09/05/18 0332  NA 140 138 138 139 138  K 3.6 4.0 3.6 4.0 4.3  CL 107 108 104 103 100  CO2 22 21* _0 GLUCOSE 130* 116* 137* 102* 121*  BUN 60* 64* 65* 69* 72*  CREATININE 3.62* 3.55* 3.61* 3.69* 3.65*  CALCIUM 8.6* 8.4* 8.6* 8.6* 8.6*  MG  --  2.0 2.1 2.2 2.2  PHOS  --   --   --  4.5 4.2   GFR: Estimated Creatinine Clearance: 39 mL/min (A) (by C-G formula based on SCr of 3.65 mg/dL (H)). Liver Function Tests: Recent Labs  Lab 08/29/18 1229 09/04/18 0414 09/05/18 0332  AST 21  --   --   ALT 16  --   --   ALKPHOS 87  --   --   BILITOT 0.9  --   --   PROT 6.6  --   --   ALBUMIN 2.6* 2.6* 2.8*   CBG: Recent Labs  Lab 09/04/18 0802 09/04/18 1154 09/04/18 1624 09/04/18 2023 09/05/18 0602  GLUCAP 105* 125* 143* 157* 127*   Sepsis Labs: Recent Labs  Lab 08/29/18 1738  LATICACIDVEN 1.3     Radiology Studies: No results found.   Scheduled Meds:  amiodarone  200 mg Oral BID   apixaban  5 mg Oral BID   carvedilol  25 mg Oral BID WC   feeding supplement (PRO-STAT SUGAR FREE 64)  30 mL Oral BID   insulin aspart  0-5 Units Subcutaneous QHS   insulin aspart  0-9 Units Subcutaneous TID WC   isosorbide-hydrALAZINE  1 tablet Oral BID   multivitamin with minerals  1 tablet Oral Daily   potassium chloride  40 mEq Oral Daily   silver sulfADIAZINE   Topical Daily   sodium chloride flush  3 mL Intravenous Q12H   torsemide  100 mg Oral Daily   Continuous Infusions:  sodium chloride 250 mL (09/04/18 1413)     LOS: 5 days     Bonnielee Haff, MD [facility] Pager AMION  If 7PM-7AM, please contact night-coverage  [email or web address] Password [email or web address] 09/05/2018, 10:01 AM

## 2018-09-06 ENCOUNTER — Other Ambulatory Visit (HOSPITAL_COMMUNITY): Payer: Self-pay

## 2018-09-06 LAB — RENAL FUNCTION PANEL
ANION GAP: 10 (ref 5–15)
Albumin: 2.9 g/dL — ABNORMAL LOW (ref 3.5–5.0)
BUN: 73 mg/dL — AB (ref 6–20)
CO2: 25 mmol/L (ref 22–32)
Calcium: 8.7 mg/dL — ABNORMAL LOW (ref 8.9–10.3)
Chloride: 102 mmol/L (ref 98–111)
Creatinine, Ser: 3.74 mg/dL — ABNORMAL HIGH (ref 0.61–1.24)
GFR calc Af Amer: 22 mL/min — ABNORMAL LOW (ref >60)
GFR calc non Af Amer: 19 mL/min — ABNORMAL LOW (ref >60)
Glucose, Bld: 124 mg/dL — ABNORMAL HIGH (ref 70–99)
POTASSIUM: 4.4 mmol/L (ref 3.5–5.1)
Phosphorus: 4 mg/dL (ref 2.5–4.6)
Sodium: 137 mmol/L (ref 135–145)

## 2018-09-06 LAB — GLUCOSE, CAPILLARY
Glucose-Capillary: 126 mg/dL — ABNORMAL HIGH (ref 70–99)
Glucose-Capillary: 135 mg/dL — ABNORMAL HIGH (ref 70–99)

## 2018-09-06 MED ORDER — ADULT MULTIVITAMIN W/MINERALS CH: 1.0000 | ORAL_TABLET | Freq: Every day | ORAL | 0 refills | Status: AC

## 2018-09-06 MED ORDER — POTASSIUM CHLORIDE CRYS ER 20 MEQ PO TBCR: 40.0000 meq | EXTENDED_RELEASE_TABLET | Freq: Every day | ORAL | 0 refills | Status: DC

## 2018-09-06 MED ORDER — TORSEMIDE 100 MG PO TABS: 100.0000 mg | ORAL_TABLET | Freq: Every day | ORAL | 1 refills | Status: DC

## 2018-09-06 MED ORDER — SILVER SULFADIAZINE 1 % EX CREA: TOPICAL_CREAM | Freq: Every day | CUTANEOUS | 0 refills | Status: AC

## 2018-09-06 MED ORDER — ISOSORB DINITRATE-HYDRALAZINE 20-37.5 MG PO TABS: 1.0000 | ORAL_TABLET | Freq: Two times a day (BID) | ORAL | 1 refills | Status: DC

## 2018-09-06 NOTE — TOC Transition Note (Signed)
Transition of Care [facility]) - CM/SW Discharge Note   Patient Details  Name: Joshua Massey MRN: [id number] Date of Birth: 1976-04-17  [facility] CM/SW Contact:  Royston Bake, RN Phone Number: 09/06/2018, 12:00 PM   Clinical Narrative:    Johnson arranged with [facility] as requested; Dan with [facility] ( [facility]) called for arrangements. No other needs identified at this time.   Final next level of care: [place] Barriers to Discharge: No Barriers Identified   Patient Goals and CMS Choice Patient states their goals for this hospitalization and ongoing recovery are:: stop being short of breath CMS Medicare.gov Compare Post Acute Care list provided to:: Patient Choice offered to / list presented to : Patient  Discharge Placement                       Discharge Plan and Services   Discharge Planning Services: CM Consult                HH Arranged: RN [facility] Agency: [place] ([facility])   Social Determinants of Health (SDOH) Interventions     Readmission Risk Interventions No flowsheet data found.

## 2018-09-06 NOTE — Telephone Encounter (Addendum)
Samples given of bidil. 1 bottle. Lot # [id number] A expiration: 01/22. Also gave 1 scale.

## 2018-09-06 NOTE — Progress Notes (Signed)
Subjective:   UOP 2600 on high dose torsemide- kidney function stable   Objective Vital signs in last 24 hours: Vitals:   09/05/18 1205 09/05/18 2056 09/05/18 2100 09/06/18 0501  BP: (!) 126/97 (!) 130/104 (!) 132/92 (!) 142/105  Pulse: 72 70  74  Resp: 20 20  12   Temp: 97.9 F (36.6 C) 98.9 F (37.2 C)  98.3 F (36.8 C)  TempSrc: Oral Oral  Oral  SpO2: 98% 100%  92%  Weight:    (!) 143.1 kg  Height:       Weight change: -2.54 kg  Intake/Output Summary (Last 24 hours) at 09/06/2018 0846 Last data filed at 09/06/2018 0505 Gross per 24 hour  Intake 483 ml  Output 2601 ml  Net -2118 ml    Assessment/ Plan: Pt is a 43 y.o. yo male with DM, HTN, CHF- EF 15-20% and advanced CKD who was admitted on 08/29/2018 with anasarca   Assessment/Plan: 1. Renal- progressive kidney decline over the years- he says only heard at last visit with PCP that he needed to come and see [place], says is willing.   I informed him that his kidney function is only at 25 % and what that means.  No uremic sxms.  I am afraid would not be candidate for kidney transplant due to cardiomyopathy unless he could get a heart/kidney transplant.  He tells me he will follow up with [place] 2. Vol/htn-  Still quite overloaded, he says have made progress.  changing to  torsemide-100 daily starting 3/18  , should continue to tolerate more diuresis.  If BP drops more, I would elect to decrease bidil instead of decreasing diuretics.  In fact have decreased bidil to BID.  This will need to be followed closely as OP   3. Anemia- not a major issue  4. Secondary hyperparathyroidism- will check PTH (pending) and phos ([place]) 5.  Have added on potassium for anticipated lower K - is stable  6.   I am comfortable with discharge today on torsemide 100mg  daily and kdur 40 meq daily and follow up with [place] and cards- have told him he needs to weight himself daily    Louis Meckel    Labs: Basic Metabolic Panel: Recent Labs  Lab 09/04/18 0414  09/05/18 0332 09/06/18 0500  NA 139 138 137  K 4.0 4.3 4.4  CL 103 100 102  CO2 25 30 25   GLUCOSE 102* 121* 124*  BUN 69* 72* 73*  CREATININE 3.69* 3.65* 3.74*  CALCIUM 8.6* 8.6* 8.7*  PHOS 4.5 4.2 4.0   Liver Function Tests: Recent Labs  Lab 09/04/18 0414 09/05/18 0332 09/06/18 0500  ALBUMIN 2.6* 2.8* 2.9*   No results for input(s): LIPASE, AMYLASE in the last 168 hours. No results for input(s): AMMONIA in the last 168 hours. CBC: Recent Labs  Lab 09/01/18 0445 09/02/18 0720 09/03/18 0553 09/04/18 0414 09/05/18 0332  WBC 5.4 5.1 5.8 4.8 5.3  HGB 11.3* 11.8* 11.7* 11.5* 11.8*  HCT 36.4* 37.4* 38.5* 37.6* 36.6*  MCV 93.3 90.6 91.2 91.3 89.9  PLT 244 260 265 230 262   Cardiac Enzymes: No results for input(s): CKTOTAL, CKMB, CKMBINDEX, TROPONINI in the last 168 hours. CBG: Recent Labs  Lab 09/05/18 0602 09/05/18 1203 09/05/18 1659 09/05/18 2106 09/06/18 0629  GLUCAP 127* 133* 192* 105* 126*    Iron Studies: No results for input(s): IRON, TIBC, TRANSFERRIN, FERRITIN in the last 72 hours. Studies/Results: No results found. Medications: Infusions: . sodium chloride 250  mL (09/04/18 1413)    Scheduled Medications: . amiodarone  200 mg Oral BID  . apixaban  5 mg Oral BID  . carvedilol  25 mg Oral BID WC  . feeding supplement (PRO-STAT SUGAR FREE 64)  30 mL Oral BID  . insulin aspart  0-5 Units Subcutaneous QHS  . insulin aspart  0-9 Units Subcutaneous TID WC  . isosorbide-hydrALAZINE  1 tablet Oral BID  . multivitamin with minerals  1 tablet Oral Daily  . potassium chloride  40 mEq Oral Daily  . silver sulfADIAZINE   Topical Daily  . sodium chloride flush  3 mL Intravenous Q12H  . torsemide  100 mg Oral Daily    have reviewed scheduled and prn medications.  Physical Exam: General: NAD Heart: RRR Lungs: mostly clear Abdomen: obese, non tender Extremities: pitting edema throughout still     09/06/2018,8:46 AM  LOS: 6 days

## 2018-09-06 NOTE — Discharge Summary (Signed)
[facility]  Physician Discharge Summary   Patient ID: Joshua Massey MRN: [id number] DOB/AGE: 12/31/1975 43 y.o.  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 Patient instructed to follow-up with primary care provider and nephrology 2. He is also to call Dr. Jess Barters office for follow-up appointment    Home Health: Home health RN for wound care Equipment/Devices: None  CODE STATUS: Full code  DISCHARGE CONDITION: fair  Diet recommendation: Modified carbohydrate  INITIAL HISTORY: Joshua Coster Simmonsis a 43 y.o.malewith medical history significant forchronic kidney disease stage IV, type 2 diabetes mellitus, hypertension, chronic combined systolic and diastolic CHF with EF 15 to 20%, paroxysmal atrial fibrillation on Eliquis, and diabetic foot wounds with osteomyelitis status post debridement of the left foot in January, now presenting to emergency department for evaluation of increased leg swelling, weight gain, and shortness of breath. He was seen by his cardiologist today for evaluation of the symptoms, was found to be massively volume overloaded, and directed to the ED for evaluation and management of this. He denies any chest pain, fevers, or chills.   Consultations:  Nephrology  Procedures:  Transthoracic echocardiogram 1. The left ventricle has severely reduced systolic function, with an ejection fraction of 20-25%. The cavity size was mildly dilated. There is mildly increased left ventricular wall thickness. Left ventricular diastolic Doppler parameters are  consistent with impaired relaxation. Left  ventricular diffuse hypokinesis. 2. There is mild sparing of contractile performance of the basal inferolateral segment. 3. The right ventricle has normal systolic function. The cavity was normal. There is no increase in right ventricular wall thickness. 4. Left atrial size was mild-moderately dilated. 5. Small pericardial effusion. 6. The pericardial effusion is posterior to the left ventricle. 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 Mild thickening of the mitral valve leaflet. 8. Pulmonic valve regurgitation is moderate is mild by color flow Doppler. 9. Pulmonary hypertension is moderate. 10. The inferior vena cava was dilated in size with <50% respiratory variability.   HOSPITAL COURSE:    1.Acute on chronic combined CHF -Presented with increased peripheral edema, wt-gain, and SOB -TTE from June 2019 with EF 15-20%, diffuse HK, grade 2 diastolic dysfunction, mild MR, and severe LAE - Repeat echo EF 20-25%. Diastolic dysfunction.  Normal RV function.  Mild sparing of contractile performance of the basal inferolateral segment.  See report. -Due to his renal dysfunction nephrology was also consulted. -Patient was transitioned from furosemide to torsemide.  Dose of torsemide increased to 100 mg daily.  His weight appears to be decreasing.   - Outpatient cards follow up  2.Diabetic foot infection with osteomyelitis -Patient has chronic bilateral foot wounds and had debridement on the left in January - CRP and ESR wnl  - Plain films of L foot with concern for increased erosion at base of first metataral concerning for worsening osteomyelitis - Seen by Dr. Sharol Given 3/12, recommending compression for both legs, continue kneeling scooter and follow up in office in 1 week.    Previous rounding MD is discussed with Dr. Sharol Given and it was recommended to hold off antibiotics.   - wound care per dr. Sharol Given and wound care c/s (planning for twice weekly unna boots to RLE, LLE silvadene to L  foot wound -  see instructions).    Home health has been ordered.  3.CKD IV -baseline between 3.25-4.73 -Renal function has been stable.  Patient seen by nephrology.  Outpatient follow-up with them.  4.CAD -No anginal complaints -Reports he was recently taken off of statin, will continue beta-blocker  5.Insulin-dependent DM -A1c was 8.5% in December -CBGs are reasonably well controlled.  6.  Essential hypertension -Blood pressure is reasonably well controlled.    Dose of BiDil was reduced.  7. Paroxysmal atrial fibrillation  - In sinus rhythm on admission  - CHADS-VASc is 4 (CAD, CHF, DM, HTN)  -Continue Eliquis.  He is also on amiodarone.  # Abnormal Thyroid Function Tests:   TSH was noted to be 5.9.  Free T4 normal at 1.32.  Outpatient evaluation.     Overall stable.  Okay for discharge home today.   PERTINENT LABS:  The results of significant diagnostics from this hospitalization (including imaging, microbiology, ancillary and laboratory) are listed below for reference.      Labs: Basic Metabolic Panel: Recent Labs  Lab 09/02/18 0720 09/03/18 0553 09/04/18 0414 09/05/18 0332 09/06/18 0500  NA 138 138 139 138 137  K 4.0 3.6 4.0 4.3 4.4  CL 108 104 103 100 102  CO2 21* 24 25 30 25   GLUCOSE 116* 137* 102* 121* 124*  BUN 64* 65* 69* 72* 73*  CREATININE 3.55* 3.61* 3.69* 3.65* 3.74*  CALCIUM 8.4* 8.6* 8.6* 8.6* 8.7*  MG 2.0 2.1 2.2 2.2  --   PHOS  --   --  4.5 4.2 4.0   Liver Function Tests: Recent Labs  Lab 09/04/18 0414 09/05/18 0332 09/06/18 0500  ALBUMIN 2.6* 2.8* 2.9*   CBC: Recent Labs  Lab 09/01/18 0445 09/02/18 0720 09/03/18 0553 09/04/18 0414 09/05/18 0332  WBC 5.4 5.1 5.8 4.8 5.3  HGB 11.3* 11.8* 11.7* 11.5* 11.8*  HCT 36.4* 37.4* 38.5* 37.6* 36.6*  MCV 93.3 90.6 91.2 91.3 89.9  PLT 244 260 265 230 262   BNP (last 3 results) Recent Labs    11/27/17 1422 08/29/18 1229  BNP 706.8* 2,101.9*     CBG: Recent Labs  Lab 09/05/18 0602 09/05/18 1203 09/05/18 1659 09/05/18 2106 09/06/18 0629  GLUCAP 127* 133* 192* 105* 126*     IMAGING STUDIES Dg Chest 2 View  Result Date: 08/29/2018 CLINICAL DATA:  Acute CHF EXAM: CHEST - 2 VIEW COMPARISON:  08/28/2018 FINDINGS: Stable marked enlargement of the cardiopericardial silhouette. Dilated cardiomyopathy or pericardial effusion might account for this appearance. Low lung volumes with atelectasis at the left base. No overt pulmonary edema, effusion or pneumothorax. No suspicious osseous abnormalities. IMPRESSION: Marked enlargement of the cardiopericardial silhouette, stable since prior without active pulmonary disease. Electronically Signed   By: Ashley Royalty M.D.   On: 08/29/2018 22:14   Dg Chest 2 View  Result Date: 08/28/2018 CLINICAL DATA:  43 year old male with lower extremity edema for 3 weeks. EXAM: CHEST - 2 VIEW COMPARISON:  11/27/2017 chest radiographs and earlier. FINDINGS: Chronic moderate to severe cardiomegaly. Cardiac silhouette appears stable since 2019. Normal cardiac size and mediastinal contours. Visualized tracheal air column is within normal limits. No pneumothorax or pleural effusion. Pulmonary vascularity appears stable without overt edema. No confluent opacity. No acute osseous abnormality identified. Paucity of bowel gas in the upper abdomen. IMPRESSION: Moderate to severe cardiomegaly is stable since 2019 with no overt edema or acute cardiopulmonary abnormality. Electronically Signed   By: Genevie Ann M.D.   On: 08/28/2018 16:31   Dg Foot  Complete Left  Result Date: 08/29/2018 CLINICAL DATA:  Acute onset of left foot swelling and erythema. EXAM: LEFT FOOT - COMPLETE 3+ VIEW COMPARISON:  Left foot radiographs performed 06/09/2018, and left foot MRI performed 06/10/2018 FINDINGS: There is been mildly increased erosion at the base of the first metatarsal, concerning for worsening osteomyelitis given the patient's symptoms.  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 Soft tissue swelling is noted about the forefoot. A soft tissue ulceration is again noted along the plantar aspect of the midfoot. Scattered vascular calcification is noted. The subtalar joint is unremarkable in appearance. IMPRESSION: 1. Mildly increased erosion at the base of the first metatarsal, concerning for worsening osteomyelitis given the patient's symptoms. 2. Underlying changes of Lisfranc injury and likely mild Charcot arthropathy again noted, with significant lateral and dorsal displacement of the bases of the second to fifth metatarsals. 3. Scattered vascular calcification noted. Electronically Signed   By: Garald Balding M.D.   On: 08/29/2018 17:39    DISCHARGE EXAMINATION: Vitals:   09/05/18 1205 09/05/18 2056 09/05/18 2100 09/06/18 0501  BP: (!) 126/97 (!) 130/104 (!) 132/92 (!) 142/105  Pulse: 72 70  74  Resp: 20 20  12   Temp: 97.9 F (36.6 C) 98.9 F (37.2 C)  98.3 F (36.8 C)  TempSrc: Oral Oral  Oral  SpO2: 98% 100%  92%  Weight:    (!) 143.1 kg  Height:       General appearance: Awake alert.  In no distress Resp: Clear to auscultation bilaterally.  Normal effort Cardio: S1-S2 is normal regular.  No S3-S4.  No rubs murmurs or bruit GI: Abdomen is soft.  Nontender nondistended.  Bowel sounds are present normal.  No masses organomegaly Extremities: Both legs covered in dressing. Neurologic: Alert and oriented x3.  No focal neurological deficits.    DISPOSITION: Home with home health  Discharge Instructions    (HEART FAILURE PATIENTS) Call MD:  Anytime you have any of the following symptoms: 1) 3 pound weight gain in 24 hours or 5 pounds in 1 week 2) shortness of breath, with or without a dry hacking cough 3) swelling in the hands, feet or stomach 4) if you have to sleep on  extra pillows at night in order to breathe.   Complete by:  As directed    Call MD for:  difficulty breathing, headache or visual disturbances   Complete by:  As directed    Call MD for:  extreme fatigue   Complete by:  As directed    Call MD for:  persistant dizziness or light-headedness   Complete by:  As directed    Call MD for:  persistant nausea and vomiting   Complete by:  As directed    Call MD for:  temperature >100.4   Complete by:  As directed    Diet - low sodium heart healthy   Complete by:  As directed    Discharge instructions   Complete by:  As directed    Please be sure to follow-up with your primary care provider within 1 week.  Follow-up with Dr. Sharol Given as well.  Please call his office to schedule appointment.  You will also hear from the kidney doctors office.  If you do not please call the number provided.  Take your medications as prescribed.  Check your weights daily.  You were cared for  by a hospitalist during your hospital stay. If you have any questions about your discharge medications or the care you received while you were in the hospital after you are discharged, you can call the unit and asked to speak with the hospitalist on call if the hospitalist that took care of you is not available. Once you are discharged, your primary care physician will handle any further medical issues. Please note that NO REFILLS for any discharge medications will be authorized once you are discharged, as it is imperative that you return to your primary care physician (or establish a relationship with a primary care physician if you do not have one) for your aftercare needs so that they can reassess your need for medications and monitor your lab values. If you do not have a primary care physician, you can call [phone] for a physician referral.   Increase activity slowly   Complete by:  As directed          Allergies as of 09/06/2018   No Known Allergies     Medication List     TAKE these medications   acetaminophen 500 MG tablet Commonly known as:  TYLENOL Take 1,000 mg by mouth every 6 (six) hours as needed for moderate pain or headache.   amiodarone 200 MG tablet Commonly known as:  PACERONE Take 1 tablet (200 mg total) by mouth 2 (two) times daily.   apixaban 5 MG Tabs tablet Commonly known as:  ELIQUIS Take 1 tablet (5 mg total) by mouth 2 (two) times daily.   atorvastatin 10 MG tablet Commonly known as:  LIPITOR Take 1 tablet (10 mg total) by mouth daily with supper.   carvedilol 25 MG tablet Commonly known as:  COREG Take 1 tablet (25 mg total) by mouth 2 (two) times daily with a meal.   freestyle lancets Use as instructed   FreeStyle Libre 14 Day Reader Kerrin Mo 1 Device by Does not apply route daily.   FreeStyle Libre 14 Day Sensor Misc 1 Device by Does not apply route daily.   glucose blood test strip Commonly known as:  FREESTYLE TEST STRIPS Use as instructed   glucose monitoring kit monitoring kit 1 each by Does not apply route 4 (four) times daily - after meals and at bedtime. 1 month Diabetic Testing Supplies for QAC-QHS accuchecks.   insulin aspart 100 UNIT/ML FlexPen Commonly known as:  NovoLOG FlexPen Inject 0-15 Units into the skin 3 (three) times daily with meals. 0-15U/Subcu/TIDw/meals//CBG70-120:0U/CBG121-150:2U/CBG151-200:3U/CBG201-250:5U/CBG251-300:8U/CBG301-350:11U/CBG351-400:15U//CBG>400:Call MD   Insulin Glargine 100 UNIT/ML Solostar Pen Commonly known as:  LANTUS Inject 5 Units into the skin daily. What changed:  when to take this   Insulin Pen Needle 32G X 8 MM Misc Use as directed   Iron 325 (65 Fe) MG Tabs Take 1 tablet (325 mg total) by mouth 2 (two) times daily with a meal.   isosorbide-hydrALAZINE 20-37.5 MG tablet Commonly known as:  BiDil Take 1 tablet by mouth 2 (two) times daily. What changed:  when to take this   multivitamin with minerals Tabs tablet Take 1 tablet by mouth daily. Start taking  on:  September 07, 2018   oxyCODONE-acetaminophen 5-325 MG tablet Commonly known as:  PERCOCET/ROXICET Take 1 tablet by mouth every 6 (six) hours as needed for moderate pain or severe pain.   potassium chloride SA 20 MEQ tablet Commonly known as:  K-DUR,KLOR-CON Take 2 tablets (40 mEq total) by mouth daily. Start taking on:  September 07, 2018   silver sulfADIAZINE 1 %  cream Commonly known as:  SILVADENE Apply topically daily. Silvadene plus dry dressing to plantar ulcer left foot.  Change daily. Start taking on:  September 07, 2018   torsemide 100 MG tablet Commonly known as:  DEMADEX Take 1 tablet (100 mg total) by mouth daily. What changed:    medication strength  how much to take   Vitamin D (Ergocalciferol) 1.25 MG (50000 UT) Caps capsule Commonly known as:  DRISDOL Take 1 capsule (50,000 Units total) by mouth every 7 (seven) days.        Follow-up Information    Newt Minion, MD Follow up in 1 week(s).   Specialty:  Orthopedic Surgery Contact information: [place] [address] [phone]        [name], [name], MD. Schedule an appointment as soon as possible for a visit in 1 week(s).   Specialty:  Family Medicine Contact information: [place] [address] [phone]        [name], MD Follow up.   Specialty:  Nephrology Why:  Please call his office if you don't hear anything from them in 1-2 weeks Contact information: [place] [address] [phone]           TOTAL DISCHARGE TIME: 29 minutes  [place]  [facility] Pager on [email or web address]  09/06/2018, 10:07 AM

## 2018-09-12 ENCOUNTER — Telehealth: Payer: Self-pay

## 2018-09-12 NOTE — Telephone Encounter (Deleted)
Copied from [place] [phone]. Topic: Quick Communication - Home Health Verbal Orders >> Sep 12, 2018  1:34 PM Jodie Echevaria wrote: Caller/Agency: Gwynne Edinger / [place] Number: [phone]. [phone] Requesting OT/PT/Skilled Nursing/Social Work/Speech Therapy: General Care / CHF, airway maintenance Frequency: Not specified

## 2018-09-12 NOTE — Telephone Encounter (Signed)
Caller/Agency: Gwynne Edinger / [place] Number: [phone] Requesting OT/PT/Skilled Nursing/Social Work/Speech Therapy: General Care / CHF, airway maintenance Frequency: Not specified

## 2018-09-12 NOTE — Telephone Encounter (Signed)
Spoke with [place] and gave verbal orders as requested. Gwynne Edinger will fax over plan of care for Dr. [name] to sign.

## 2018-09-13 ENCOUNTER — Inpatient Hospital Stay (INDEPENDENT_AMBULATORY_CARE_PROVIDER_SITE_OTHER): Payer: BLUE CROSS/BLUE SHIELD | Admitting: Orthopedic Surgery

## 2018-09-19 ENCOUNTER — Ambulatory Visit (HOSPITAL_COMMUNITY): Payer: BLUE CROSS/BLUE SHIELD

## 2018-09-19 ENCOUNTER — Encounter (HOSPITAL_COMMUNITY): Payer: Self-pay | Admitting: Internal Medicine

## 2018-09-19 ENCOUNTER — Other Ambulatory Visit (HOSPITAL_COMMUNITY): Payer: Self-pay

## 2018-09-20 ENCOUNTER — Telehealth (INDEPENDENT_AMBULATORY_CARE_PROVIDER_SITE_OTHER): Payer: Self-pay | Admitting: Radiology

## 2018-09-20 ENCOUNTER — Other Ambulatory Visit: Payer: Self-pay | Admitting: Family Medicine

## 2018-09-20 NOTE — Telephone Encounter (Signed)
Called and spoke with patient, patient answered NO to all pre screening questions for appointment on 4/6

## 2018-09-21 ENCOUNTER — Telehealth: Payer: Self-pay | Admitting: Family Medicine

## 2018-09-21 NOTE — Telephone Encounter (Signed)
Copied from [place] [phone]. Topic: Quick Communication - Home Health Verbal Orders >> Sep 21, 2018  4:23 PM Rutherford Nail, [place] wrote: Caller/Agency: [place] Number: [phone] (can leave a voicemail) Requesting OT/PT/Skilled Nursing/Social Work/Speech Therapy: Nursing Frequency:  2x a week for 2 weeks  FYI: Missed visit today (09/21/2018) due to patient not being home.

## 2018-09-23 IMAGING — DX DG CHEST 2V
2 series · 2 of 2 positions shown · non-contrast
Comparison: 02/14/2012

CLINICAL DATA: Shortness of breath tonight.

EXAM:
CHEST  2 VIEW

[chest pa]
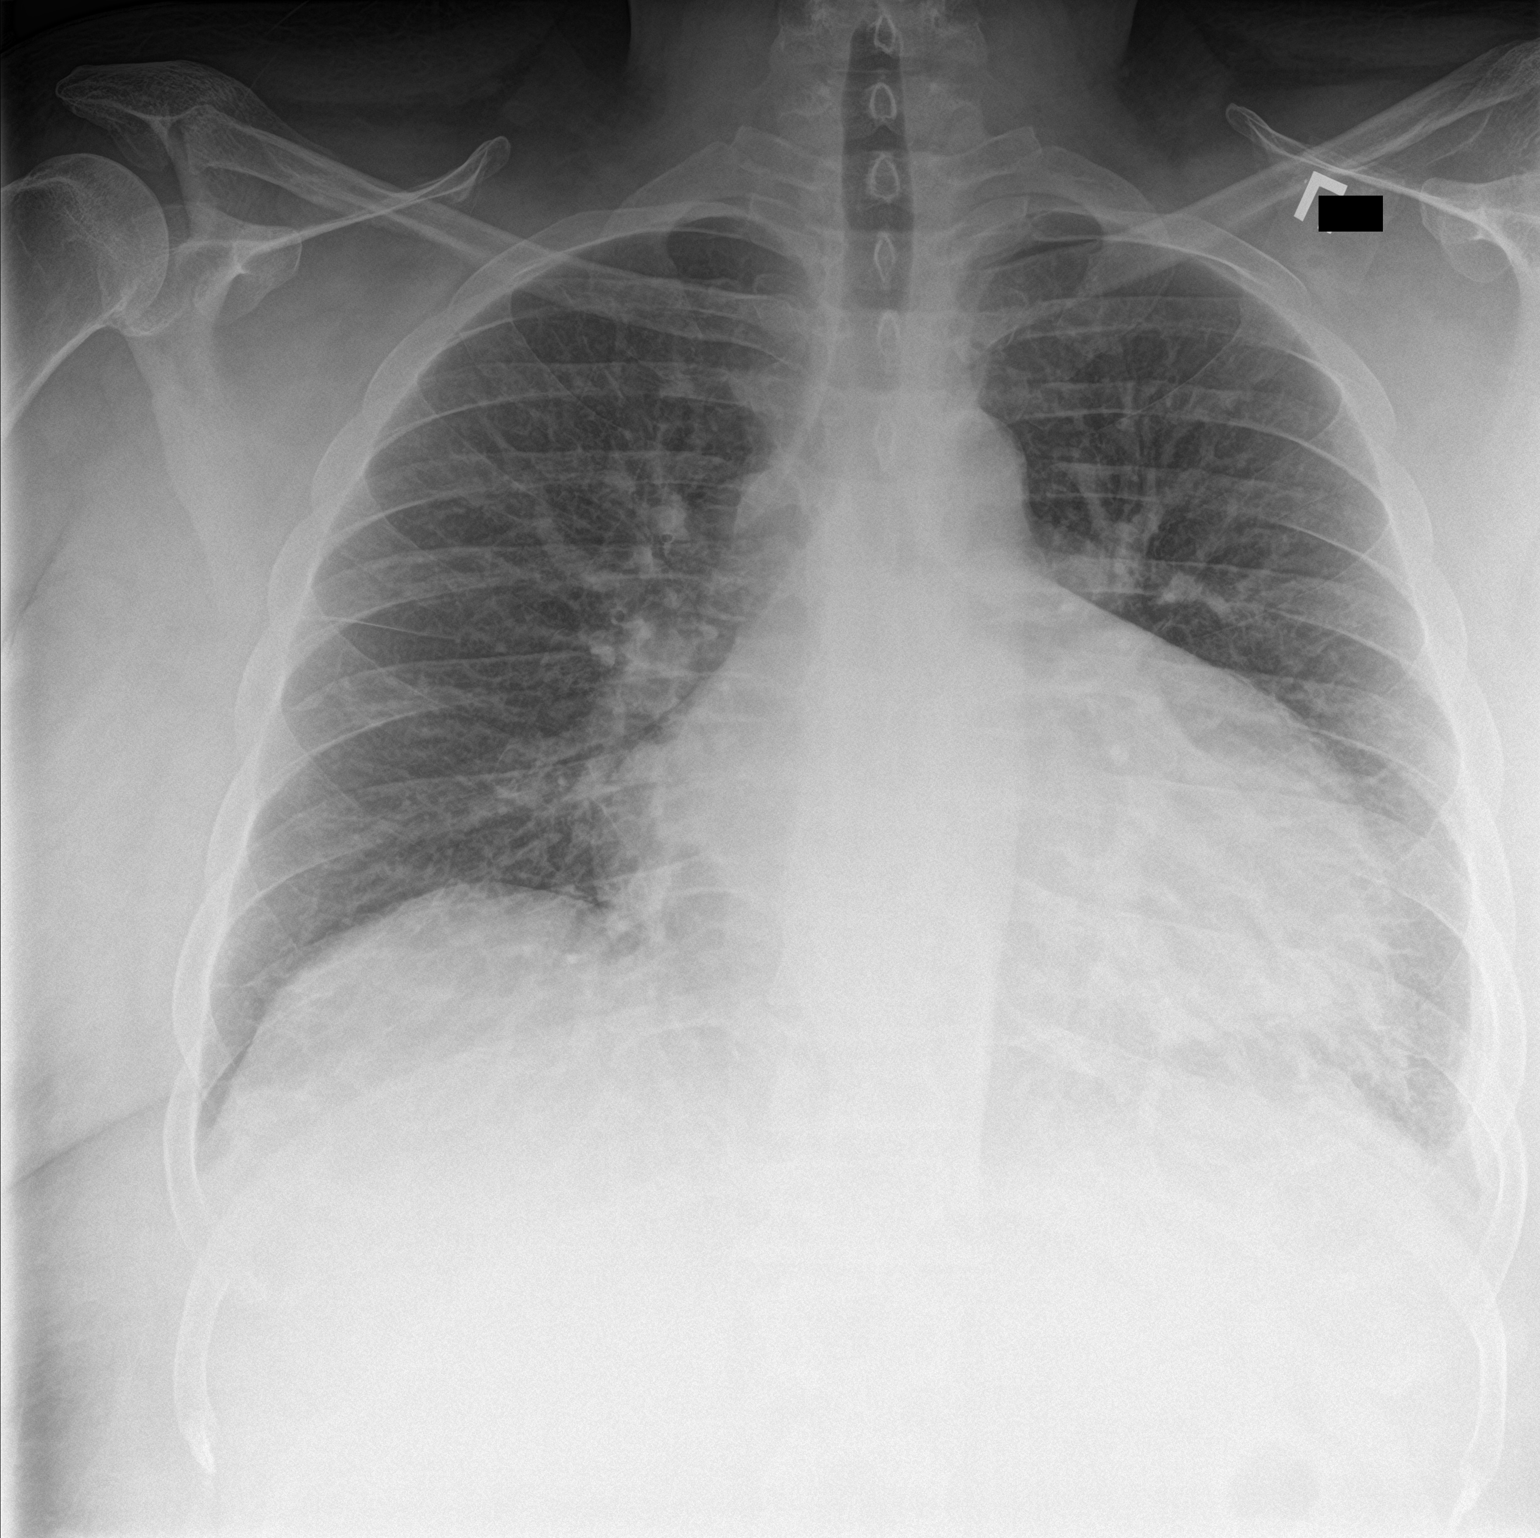

[chest lat]
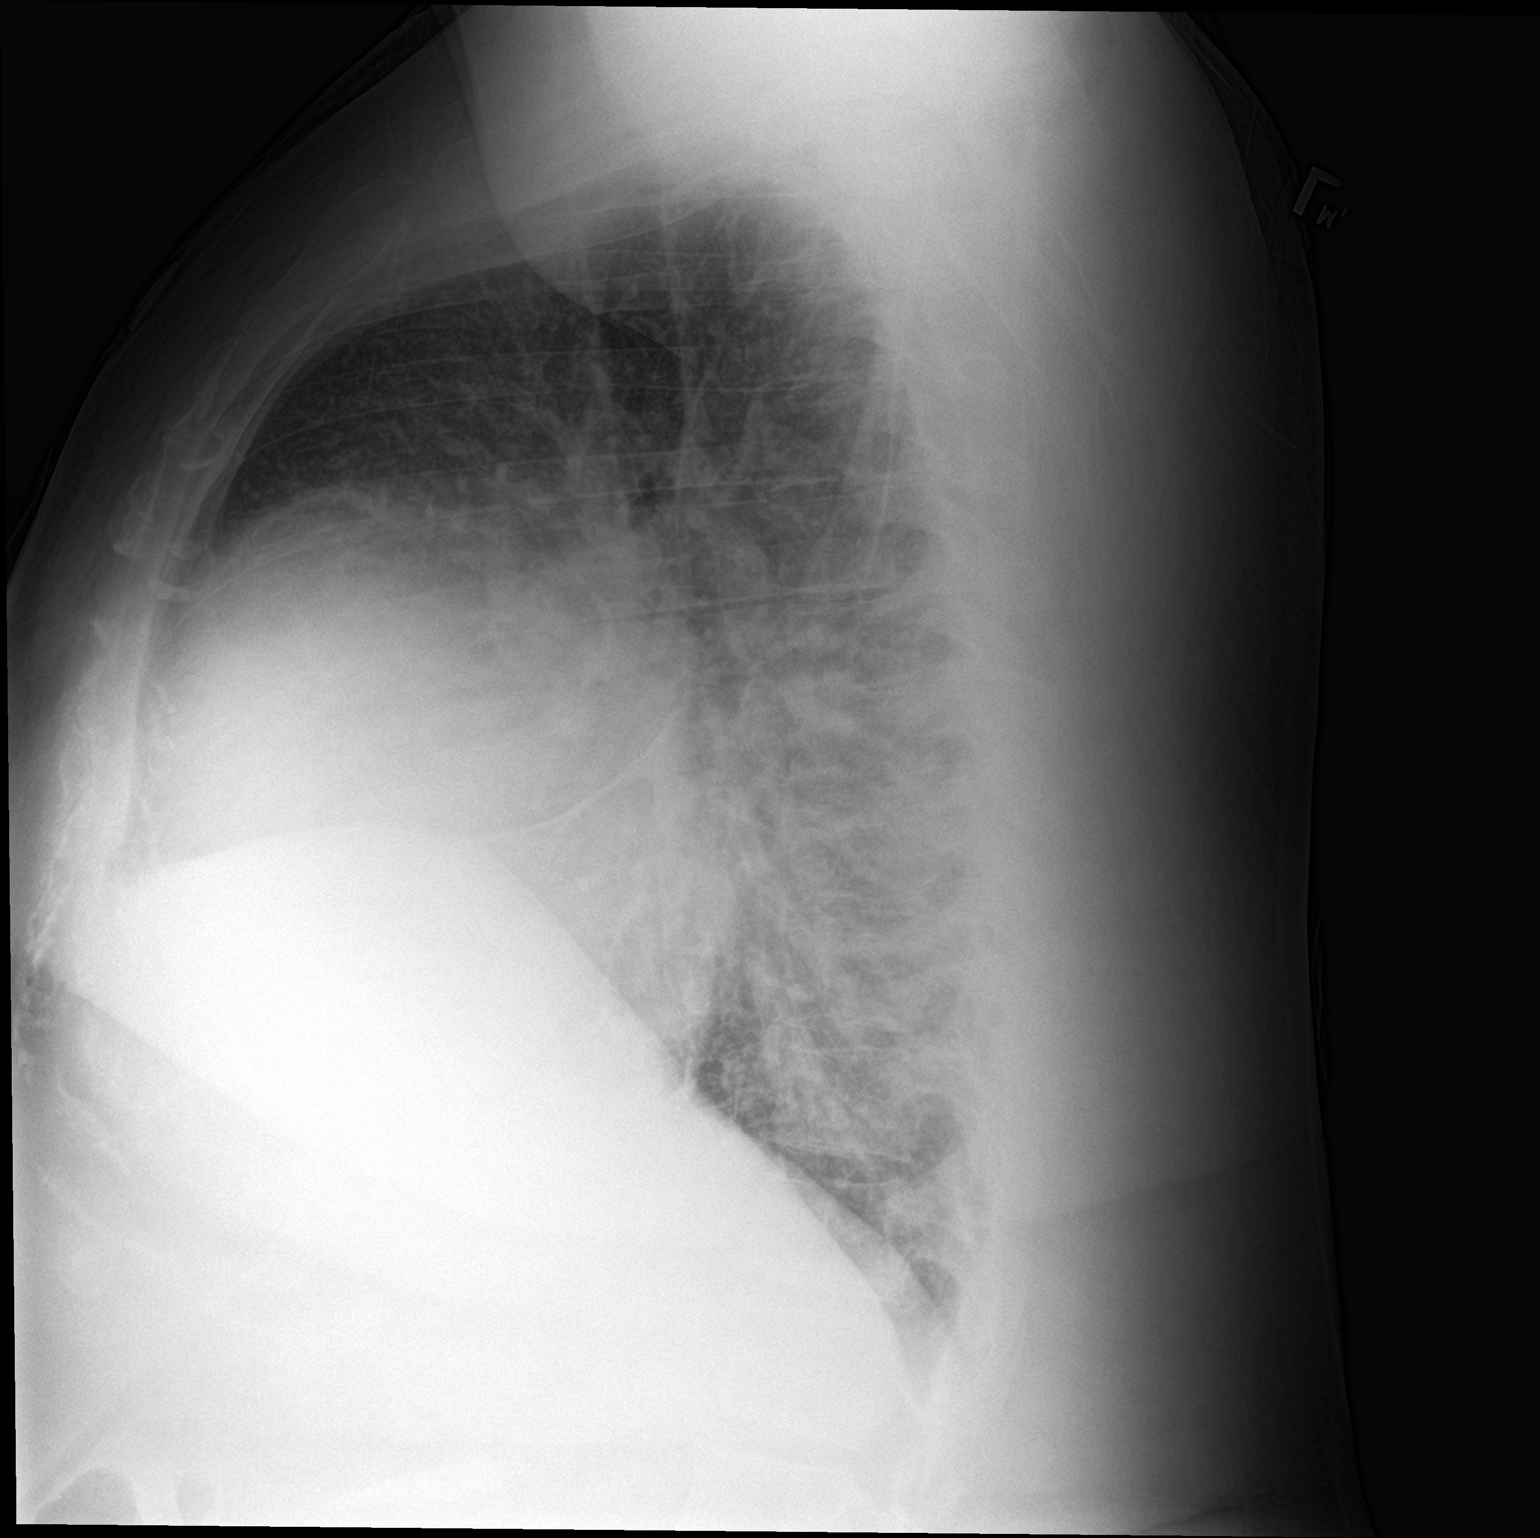

[2 of 2 positions shown; findings below may reference images not displayed]

FINDINGS: Moderate-to-marked cardiomegaly which is stable. Central vascular
congestion and pulmonary edema. Probable left pleural effusion and
minimal fluid in the fissures. No pneumothorax or confluent airspace
disease.
IMPRESSION: Findings consistent with CHF.

## 2018-09-24 ENCOUNTER — Other Ambulatory Visit: Payer: Self-pay

## 2018-09-24 ENCOUNTER — Encounter (INDEPENDENT_AMBULATORY_CARE_PROVIDER_SITE_OTHER): Payer: Self-pay | Admitting: Orthopedic Surgery

## 2018-09-24 ENCOUNTER — Ambulatory Visit (INDEPENDENT_AMBULATORY_CARE_PROVIDER_SITE_OTHER): Payer: BLUE CROSS/BLUE SHIELD | Admitting: Physician Assistant

## 2018-09-24 VITALS — Ht 71.0 in | Wt 315.4 lb

## 2018-09-24 DIAGNOSIS — N183 Chronic kidney disease, stage 3 unspecified: Secondary | ICD-10-CM

## 2018-09-24 DIAGNOSIS — L97426 Non-pressure chronic ulcer of left heel and midfoot with bone involvement without evidence of necrosis: Secondary | ICD-10-CM

## 2018-09-24 DIAGNOSIS — Z945 Skin transplant status: Secondary | ICD-10-CM

## 2018-09-24 DIAGNOSIS — E11621 Type 2 diabetes mellitus with foot ulcer: Secondary | ICD-10-CM

## 2018-09-24 DIAGNOSIS — Z794 Long term (current) use of insulin: Secondary | ICD-10-CM

## 2018-09-24 DIAGNOSIS — E440 Moderate protein-calorie malnutrition: Secondary | ICD-10-CM

## 2018-09-24 DIAGNOSIS — I428 Other cardiomyopathies: Secondary | ICD-10-CM

## 2018-09-24 DIAGNOSIS — E1142 Type 2 diabetes mellitus with diabetic polyneuropathy: Secondary | ICD-10-CM

## 2018-09-24 MED ORDER — OXYCODONE-ACETAMINOPHEN 5-325 MG PO TABS: 1.0000 | ORAL_TABLET | Freq: Three times a day (TID) | ORAL | 0 refills | Status: DC | PRN

## 2018-09-24 NOTE — Telephone Encounter (Signed)
Left detailed message for Joshua Massey with verbal orders as requested.

## 2018-09-26 ENCOUNTER — Encounter (INDEPENDENT_AMBULATORY_CARE_PROVIDER_SITE_OTHER): Payer: Self-pay | Admitting: Physician Assistant

## 2018-09-26 NOTE — Progress Notes (Signed)
Office Visit Note   Patient: Joshua Massey           Date of Birth: 1975-06-29           MRN: [id number] Visit Date: 09/24/2018              Requested by: [name], [name], MD [address] [place], [place] [address] PCP: [name], [name], MD  Chief Complaint  Patient presents with  . Left Foot - Routine Post Op    07/04/2018 left foot deb      HPI: The patient is a 43 year old gentleman who is seen for postoperative follow-up following a debridement of a plantar left foot ulcer and placement of a allograft, split thickness skin graft to the residual wound on 07/04/2018.  He recently had a hospitalization due to worsening of his congestive heart failure and had significant edema of both lower extremities but reports this is much improved following his hospitalization.  He also had a wound over the right great toe and reports that this is dried up and closed over but we are still concerned that he will eventually need operative intervention on this side as well. On the left foot he has been utilizing some Silvadene dressing changes daily to the residual open area.  He notices some minimal drainage from the area.  He is getting Dynaflex wraps to the right leg through advanced home health care and is going to continue on this.  Assessment & Plan: Visit Diagnoses:  1. S/P split thickness skin graft   2. Diabetic ulcer of left midfoot associated with type 2 diabetes mellitus, with bone involvement without evidence of necrosis ([place])   3. Type 2 diabetes mellitus with diabetic polyneuropathy, with long-term current use of insulin ([place])   4. Non-ischemic cardiomyopathy, EF < 20%   5. CKD (chronic kidney disease), stage III ([place])   6. Protein-calorie malnutrition, moderate ([place])     Plan: After informed consent the proud tissue, hyper granulation over the left plantar midfoot was debrided with a #10 blade knife and then treated with silver nitrate for hemostasis.  Recommend continued  Silvadene dressing changes daily to the foot after showering with Dial soap and water and Ace wrap for edema control.  Continue Dynaflex on the right leg.  We did refill his pain medication this visit and discussed judicious use of this.  He will follow-up in 2 weeks.  Follow-Up Instructions: Return in about 2 weeks (around 10/08/2018).   Ortho Exam  Patient is alert, oriented, no adenopathy, well-dressed, normal affect, normal respiratory effort. The left foot plantar ulcer in the midfoot has hyper granulation, proud tissue above the skin level and this was debrided with a #10 blade knife and treated with silver nitrate for hemostasis.  There are no signs of infection currently or cellulitis of the foot.  He does have nonpitting edema of the left foot and leg today but reports it is much improved from his recent hospitalization.  He has palpable pedal pulses on the left foot.  Imaging: No results found. No images are attached to the encounter.  Labs: Lab Results  Component Value Date   HGBA1C 8.5 (H) 06/10/2018   HGBA1C 11.5 (H) 11/28/2017   HGBA1C 9.7 (H) 12/20/2016   ESRSEDRATE 5 08/30/2018   CRP <0.8 08/30/2018   REPTSTATUS 06/15/2018 FINAL 06/10/2018   GRAMSTAIN  06/10/2018    RARE WBC PRESENT, PREDOMINANTLY PMN FEW GRAM POSITIVE COCCI IN PAIRS RARE GRAM NEGATIVE RODS  CULT  06/10/2018    FEW MORGANELLA MORGANII MODERATE BACTEROIDES THETAIOTAOMICRON BETA LACTAMASE POSITIVE Performed at [facility], [place] [address], [place], [place] [address]    [place] MORGANII 06/10/2018     Lab Results  Component Value Date   ALBUMIN 2.9 (L) 09/06/2018   ALBUMIN 2.8 (L) 09/05/2018   ALBUMIN 2.6 (L) 09/04/2018    Body mass index is 43.99 kg/m.  Orders:  No orders of the defined types were placed in this encounter.  Meds ordered this encounter  Medications  . oxyCODONE-acetaminophen (PERCOCET/ROXICET) 5-325 MG tablet    Sig: Take 1 tablet by mouth every 8  (eight) hours as needed for moderate pain or severe pain.    Dispense:  20 tablet    Refill:  0     Procedures: No procedures performed  Clinical Data: No additional findings.  ROS:  All other systems negative, except as noted in the HPI. Review of Systems  Objective: Vital Signs: Ht 5\' 11"  (1.803 m)   Wt (!) 315 lb 6.4 oz (143.1 kg)   BMI 43.99 kg/m   Specialty Comments:  No specialty comments available.  PMFS History: Patient Active Problem List   Diagnosis Date Noted  . Heart failure ([place]) 08/31/2018  . Acute on chronic combined systolic and diastolic CHF (congestive heart failure) ([place]) 08/29/2018  . S/P split thickness skin graft 08/02/2018  . Subacute osteomyelitis, left ankle and foot ([place])   . Non-compliant patient 12/18/2017  . 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 Hypovitaminosis D 11/27/2017  . Diabetic foot ulcer ([place]) 11/27/2017  . Cellulitis in diabetic foot ([place]) 11/27/2017  . Acute heart failure ([place]) 11/27/2017  . Volume overload 11/27/2017  . Hepatopathy 11/27/2017  . Hypokalemia 05/31/2017  . Diabetic peripheral neuropathy ([place]) 12/20/2016  . Hyperlipidemia associated with type 2 diabetes mellitus ([place]) 11/27/2016  . Shingles 10/11/2016  . Status post peripherally inserted central catheter (PICC) central line placement 09/12/2016  . Medication monitoring encounter 09/12/2016  . Arthritis, septic ([place])   . CKD (chronic kidney disease), stage IV ([place])   . AKI (acute kidney injury) ([place])   . Peripheral edema   . Acute renal failure ([place]) 08/17/2016  . Acute systolic CHF (congestive heart failure) ([place]) 08/17/2016  . Type 2 diabetes mellitus with vascular disease ([place]) 10/21/2013  . Chronic systolic heart failure ([place]) 02/27/2012  . Coronary artery disease, 50% LAD at cath 02/16/12 02/23/2012  . Family history of coronary artery disease 02/23/2012  . Cardiogenic shock ([place])  02/17/2012  . NSVT (nonsustained ventricular tachycardia) ([place]) 02/16/2012  . Obesity 02/16/2012  . Acute on chronic systolic CHF (congestive heart failure) ([place]) 02/16/2012  . CHF (congestive heart failure) ([place]) 02/15/2012  . Hypertension 02/15/2012  . Non-ischemic cardiomyopathy, EF < 20% 02/15/2012  . TINEA CRURIS 09/25/2009  . ALLERGIC RHINITIS CAUSE UNSPECIFIED 09/25/2009  . SCROTAL ABSCESS 09/17/2009  . Hypertensive heart disease with heart failure ([place]) 01/26/2009   Past Medical History:  Diagnosis Date  . 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 Chronic kidney disease   . Diabetes mellitus    adult-onset, type 2  . Exogenous obesity   . GERD (gastroesophageal reflux disease)   . Hypertension   . Left foot infection 06/21/2018  . Osteomyelitis of foot, left, acute ([place]) 07/04/2018    Family History  Problem Relation Age of Onset  . Coronary artery disease Brother  CABG at 31, DM  . Heart disease Brother 34       CAD  . Diabetes Mother   . Hypertension Mother   . Hyperlipidemia Mother   . Heart disease Mother   . Diabetes Father   . Hypertension Father     Past Surgical History:  Procedure Laterality Date  . ANKLE SURGERY  2003   plate and screws  . APPLICATION OF WOUND VAC Left 06/12/2018   Procedure: APPLICATION OF WOUND VAC;  Surgeon: Mcarthur Rossetti, MD;  Location: [facility];  Service: Orthopedics;  Laterality: Left;  . CARDIAC CATHETERIZATION  02/16/2012   50% LAD lesion in mid vessel, otherwise no significant obstructive CAD  . HIP PINNING  1990   bilateral  . I&D EXTREMITY Left 06/10/2018   Procedure: IRRIGATION AND DEBRIDEMENT OF FOOT;  Surgeon: Mcarthur Rossetti, MD;  Location: [facility];  Service: Orthopedics;  Laterality: Left;  . I&D EXTREMITY Left 06/12/2018   Procedure: REPEAT IRRIGATION AND DEBRIDEMENT LEFT FOOT;  Surgeon: Mcarthur Rossetti, MD;  Location: [facility];  Service: Orthopedics;  Laterality: Left;   . I&D EXTREMITY Left 07/04/2018   Procedure: DEBRIDEMENT OF LEFT FOOT;  Surgeon: Newt Minion, MD;  Location: ;  Service: Orthopedics;  Laterality: Left;  . IR GENERIC HISTORICAL  09/01/2016   IR FLUORO GUIDE CV LINE RIGHT 09/01/2016 Marybelle Killings, MD [facility]  . IR GENERIC HISTORICAL  09/01/2016   IR US GUIDE VASC ACCESS RIGHT 09/01/2016 Marybelle Killings, MD [facility]  . IR GENERIC HISTORICAL  09/16/2016   IR REMOVAL TUN CV CATH W/O FL 09/16/2016 Arne Cleveland, MD [facility]  . KNEE ARTHROSCOPY Left 08/30/2016   Procedure: ARTHROSCOPY KNEE;  Surgeon: Renette Butters, MD;  Location: [facility];  Service: Orthopedics;  Laterality: Left;  . LEFT AND RIGHT HEART CATHETERIZATION WITH CORONARY ANGIOGRAM N/A 02/16/2012   Procedure: LEFT AND RIGHT HEART CATHETERIZATION WITH CORONARY ANGIOGRAM;  Surgeon: Pixie Casino, MD;  Location: [facility];  Service: Cardiovascular;  Laterality: N/A;  . TRANSTHORACIC ECHOCARDIOGRAM  08/31/2012   EF 35-40%, no significant wall motion abnormalities, diastolic relaxation abnormality - no longer needed LifeVest; LV cavity size mod dilated with mod conc hypertrophy, systolic function mod reduced; mild MR, LA mod dilated   Social History   Occupational History  . Occupation: Brewing technologist    Comment: [name]  . Occupation: newpaper delivery    Employer: [employer]  Tobacco Use  . Smoking status: Never Smoker  . Smokeless tobacco: Never Used  Substance and Sexual Activity  . Alcohol use: No  . Drug use: No  . Sexual activity: Yes    Birth control/protection: Condom

## 2018-10-08 ENCOUNTER — Ambulatory Visit (INDEPENDENT_AMBULATORY_CARE_PROVIDER_SITE_OTHER): Payer: BLUE CROSS/BLUE SHIELD | Admitting: Orthopedic Surgery

## 2018-10-17 ENCOUNTER — Ambulatory Visit (INDEPENDENT_AMBULATORY_CARE_PROVIDER_SITE_OTHER): Payer: BLUE CROSS/BLUE SHIELD | Admitting: Family

## 2018-10-25 ENCOUNTER — Ambulatory Visit (INDEPENDENT_AMBULATORY_CARE_PROVIDER_SITE_OTHER): Payer: BLUE CROSS/BLUE SHIELD | Admitting: Orthopedic Surgery

## 2018-10-25 ENCOUNTER — Encounter: Payer: Self-pay | Admitting: Orthopedic Surgery

## 2018-10-25 ENCOUNTER — Other Ambulatory Visit: Payer: Self-pay

## 2018-10-25 VITALS — Ht 71.0 in | Wt 315.0 lb

## 2018-10-25 DIAGNOSIS — I428 Other cardiomyopathies: Secondary | ICD-10-CM

## 2018-10-25 DIAGNOSIS — E11621 Type 2 diabetes mellitus with foot ulcer: Secondary | ICD-10-CM

## 2018-10-25 DIAGNOSIS — E1142 Type 2 diabetes mellitus with diabetic polyneuropathy: Secondary | ICD-10-CM

## 2018-10-25 DIAGNOSIS — Z945 Skin transplant status: Secondary | ICD-10-CM

## 2018-10-25 DIAGNOSIS — L97426 Non-pressure chronic ulcer of left heel and midfoot with bone involvement without evidence of necrosis: Secondary | ICD-10-CM

## 2018-10-25 DIAGNOSIS — N183 Chronic kidney disease, stage 3 unspecified: Secondary | ICD-10-CM

## 2018-10-25 DIAGNOSIS — E440 Moderate protein-calorie malnutrition: Secondary | ICD-10-CM

## 2018-10-25 DIAGNOSIS — Z794 Long term (current) use of insulin: Secondary | ICD-10-CM

## 2018-10-25 MED ORDER — OXYCODONE-ACETAMINOPHEN 5-325 MG PO TABS: 1.0000 | ORAL_TABLET | Freq: Three times a day (TID) | ORAL | 0 refills | Status: AC | PRN

## 2018-10-25 MED ORDER — GABAPENTIN 300 MG PO CAPS: 300.0000 mg | ORAL_CAPSULE | Freq: Every day | ORAL | 3 refills | Status: AC

## 2018-10-25 NOTE — Progress Notes (Signed)
Office Visit Note   Patient: Joshua Massey           Date of Birth: 09-Nov-1975           MRN: [id number] Visit Date: 10/25/2018              Requested by: [name], [name], MD [address] [place], [place] [address] PCP: [name], [name], MD  Chief Complaint  Patient presents with  . Left Foot - Follow-up    07/04/18 left foot debridement       HPI: The patient is a 43 year old gentleman who is seen for follow-up of his left foot diabetic ulcer debridement with placement of a split thickness skin graft, allograft on 07/04/2018.  He has been doing Silvadene dressing changes to the area daily.  He has been utilizing a knee scooter and trying to offload the area as much as possible.  He is noted some intermittent bloody drainage from the area.  He continues to have complaints of pain over both feet likely secondary to his diabetic peripheral neuropathy.  Assessment & Plan: Visit Diagnoses:  1. S/P split thickness skin graft   2. Diabetic ulcer of left midfoot associated with type 2 diabetes mellitus, with bone involvement without evidence of necrosis ([place])   3. Type 2 diabetes mellitus with diabetic polyneuropathy, with long-term current use of insulin ([place])   4. Non-ischemic cardiomyopathy, EF < 20%   5. CKD (chronic kidney disease), stage III ([place])   6. Protein-calorie malnutrition, moderate ([place])     Plan: Advised the patient to begin using his the vive medical compression socks to both lower extremities around the clock except for showering.  We refilled his oxycodone again today and also started gabapentin 300 mg at bedtime and if this is well-tolerated will increase the dose to 300 mg 2-3 times a day depending upon response.  Cautioned patient that this may be sedating.  He will follow-up in 2 weeks.  Follow-Up Instructions: Return in about 2 weeks (around 11/08/2018).   Ortho Exam  Patient is alert, oriented, no adenopathy, well-dressed, normal affect, normal  respiratory effort. The left foot has a small residual wound with some macerated and slight epiboly of the borders.  There is also some hyper granulation centrally.  After informed consent this was debrided with a #10 blade knife and hemostasis was achieved with silver nitrate.  We will place the patient back in medical compression socks bilaterally.  There are no signs of cellulitis or infection currently.  Imaging: No results found.   Labs: Lab Results  Component Value Date   HGBA1C 8.5 (H) 06/10/2018   HGBA1C 11.5 (H) 11/28/2017   HGBA1C 9.7 (H) 12/20/2016   ESRSEDRATE 5 08/30/2018   CRP <0.8 08/30/2018   REPTSTATUS 06/15/2018 FINAL 06/10/2018   GRAMSTAIN  06/10/2018    RARE WBC PRESENT, PREDOMINANTLY PMN FEW GRAM POSITIVE COCCI IN PAIRS RARE GRAM NEGATIVE RODS    CULT  06/10/2018    FEW MORGANELLA MORGANII MODERATE BACTEROIDES THETAIOTAOMICRON BETA LACTAMASE POSITIVE Performed at [facility], [place] [address] [place], [place] [address]    [place] MORGANII 06/10/2018     Lab Results  Component Value Date   ALBUMIN 2.9 (L) 09/06/2018   ALBUMIN 2.8 (L) 09/05/2018   ALBUMIN 2.6 (L) 09/04/2018    Body mass index is 43.93 kg/m.  Orders:  No orders of the defined types were placed in this encounter.  Meds ordered this encounter  Medications  .  oxyCODONE-acetaminophen (PERCOCET/ROXICET) 5-325 MG tablet    Sig: Take 1 tablet by mouth every 8 (eight) hours as needed for moderate pain or severe pain.    Dispense:  20 tablet    Refill:  0  . gabapentin (NEURONTIN) 300 MG capsule    Sig: Take 1 capsule (300 mg total) by mouth at bedtime.    Dispense:  90 capsule    Refill:  3     Procedures: No procedures performed  Clinical Data: No additional findings.  ROS:  All other systems negative, except as noted in the HPI. Review of Systems  Objective: Vital Signs: Ht 5\' 11"  (1.803 m)   Wt (!) 315 lb (142.9 kg)   BMI 43.93 kg/m   Specialty  Comments:  No specialty comments available.  PMFS History: Patient Active Problem List   Diagnosis Date Noted  . Heart failure ([place]) 08/31/2018  . Acute on chronic combined systolic and diastolic CHF (congestive heart failure) ([place]) 08/29/2018  . S/P split thickness skin graft 08/02/2018  . Subacute osteomyelitis, left ankle and foot ([place])   . Non-compliant patient 12/18/2017  . 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 Hypovitaminosis D 11/27/2017  . Diabetic foot ulcer ([place]) 11/27/2017  . Cellulitis in diabetic foot ([place]) 11/27/2017  . Acute heart failure ([place]) 11/27/2017  . Volume overload 11/27/2017  . Hepatopathy 11/27/2017  . Hypokalemia 05/31/2017  . Diabetic peripheral neuropathy ([place]) 12/20/2016  . Hyperlipidemia associated with type 2 diabetes mellitus ([place]) 11/27/2016  . Shingles 10/11/2016  . Status post peripherally inserted central catheter (PICC) central line placement 09/12/2016  . Medication monitoring encounter 09/12/2016  . Arthritis, septic ([place])   . CKD (chronic kidney disease), stage IV ([place])   . AKI (acute kidney injury) ([place])   . Peripheral edema   . Acute renal failure ([place]) 08/17/2016  . Acute systolic CHF (congestive heart failure) ([place]) 08/17/2016  . Type 2 diabetes mellitus with vascular disease ([place]) 10/21/2013  . Chronic systolic heart failure ([place]) 02/27/2012  . Coronary artery disease, 50% LAD at cath 02/16/12 02/23/2012  . Family history of coronary artery disease 02/23/2012  . Cardiogenic shock ([place]) 02/17/2012  . NSVT (nonsustained ventricular tachycardia) ([place]) 02/16/2012  . Obesity 02/16/2012  . Acute on chronic systolic CHF (congestive heart failure) ([place]) 02/16/2012  . CHF (congestive heart failure) ([place]) 02/15/2012  . Hypertension 02/15/2012  . Non-ischemic cardiomyopathy, EF < 20% 02/15/2012  . TINEA CRURIS 09/25/2009  . ALLERGIC RHINITIS CAUSE UNSPECIFIED 09/25/2009  .  SCROTAL ABSCESS 09/17/2009  . Hypertensive heart disease with heart failure ([place]) 01/26/2009   Past Medical History:  Diagnosis Date  . 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 Chronic kidney disease   . Diabetes mellitus    adult-onset, type 2  . Exogenous obesity   . GERD (gastroesophageal reflux disease)   . Hypertension   . Left foot infection 06/21/2018  . Osteomyelitis of foot, left, acute ([place]) 07/04/2018    Family History  Problem Relation Age of Onset  . Coronary artery disease Brother        CABG at 69, DM  . Heart disease Brother 38       CAD  . Diabetes Mother   . Hypertension Mother   . Hyperlipidemia Mother   . Heart disease Mother   . Diabetes Father   . Hypertension Father     Past Surgical History:  Procedure Laterality Date  .  ANKLE SURGERY  2003   plate and screws  . APPLICATION OF WOUND VAC Left 06/12/2018   Procedure: APPLICATION OF WOUND VAC;  Surgeon: Mcarthur Rossetti, MD;  Location: [facility];  Service: Orthopedics;  Laterality: Left;  . CARDIAC CATHETERIZATION  02/16/2012   50% LAD lesion in mid vessel, otherwise no significant obstructive CAD  . HIP PINNING  1990   bilateral  . I&D EXTREMITY Left 06/10/2018   Procedure: IRRIGATION AND DEBRIDEMENT OF FOOT;  Surgeon: Mcarthur Rossetti, MD;  Location: [facility];  Service: Orthopedics;  Laterality: Left;  . I&D EXTREMITY Left 06/12/2018   Procedure: REPEAT IRRIGATION AND DEBRIDEMENT LEFT FOOT;  Surgeon: Mcarthur Rossetti, MD;  Location: [facility];  Service: Orthopedics;  Laterality: Left;  . I&D EXTREMITY Left 07/04/2018   Procedure: DEBRIDEMENT OF LEFT FOOT;  Surgeon: Newt Minion, MD;  Location: [facility];  Service: Orthopedics;  Laterality: Left;  . IR GENERIC HISTORICAL  09/01/2016   IR FLUORO GUIDE CV LINE RIGHT 09/01/2016 Marybelle Killings, MD [facility]  . IR GENERIC HISTORICAL  09/01/2016   IR US GUIDE VASC ACCESS RIGHT 09/01/2016 Marybelle Killings, MD [facility]  . IR  GENERIC HISTORICAL  09/16/2016   IR REMOVAL TUN CV CATH W/O FL 09/16/2016 Arne Cleveland, MD [facility]  . KNEE ARTHROSCOPY Left 08/30/2016   Procedure: ARTHROSCOPY KNEE;  Surgeon: Renette Butters, MD;  Location: [facility];  Service: Orthopedics;  Laterality: Left;  . LEFT AND RIGHT HEART CATHETERIZATION WITH CORONARY ANGIOGRAM N/A 02/16/2012   Procedure: LEFT AND RIGHT HEART CATHETERIZATION WITH CORONARY ANGIOGRAM;  Surgeon: Pixie Casino, MD;  Location: [facility];  Service: Cardiovascular;  Laterality: N/A;  . TRANSTHORACIC ECHOCARDIOGRAM  08/31/2012   EF 35-40%, no significant wall motion abnormalities, diastolic relaxation abnormality - no longer needed LifeVest; LV cavity size mod dilated with mod conc hypertrophy, systolic function mod reduced; mild MR, LA mod dilated   Social History   Occupational History  . Occupation: Brewing technologist    Comment: [name]  . Occupation: newpaper delivery    Employer: [employer]  Tobacco Use  . Smoking status: Never Smoker  . Smokeless tobacco: Never Used  Substance and Sexual Activity  . Alcohol use: No  . Drug use: No  . Sexual activity: Yes    Birth control/protection: Condom

## 2018-10-28 ENCOUNTER — Encounter: Payer: Self-pay | Admitting: Orthopedic Surgery

## 2018-10-29 ENCOUNTER — Telehealth: Payer: Self-pay | Admitting: Orthopedic Surgery

## 2018-10-29 NOTE — Telephone Encounter (Signed)
Pt called he lost his Rx for the compression socks.  Pt states its a XL he just needs to know the compression level.

## 2018-10-29 NOTE — Telephone Encounter (Signed)
I called and advised that a single layer sock 15/20 compression.

## 2018-11-06 ENCOUNTER — Other Ambulatory Visit: Payer: Self-pay

## 2018-11-06 ENCOUNTER — Encounter: Payer: Self-pay | Admitting: Family Medicine

## 2018-11-06 ENCOUNTER — Ambulatory Visit (INDEPENDENT_AMBULATORY_CARE_PROVIDER_SITE_OTHER): Payer: BLUE CROSS/BLUE SHIELD | Admitting: Family Medicine

## 2018-11-06 VITALS — BP 130/89 | Resp 12

## 2018-11-06 DIAGNOSIS — E1159 Type 2 diabetes mellitus with other circulatory complications: Secondary | ICD-10-CM | POA: Diagnosis not present

## 2018-11-06 DIAGNOSIS — E559 Vitamin D deficiency, unspecified: Secondary | ICD-10-CM

## 2018-11-06 DIAGNOSIS — N184 Chronic kidney disease, stage 4 (severe): Secondary | ICD-10-CM | POA: Diagnosis not present

## 2018-11-06 DIAGNOSIS — I110 Hypertensive heart disease with heart failure: Secondary | ICD-10-CM | POA: Diagnosis not present

## 2018-11-06 DIAGNOSIS — E876 Hypokalemia: Secondary | ICD-10-CM

## 2018-11-06 DIAGNOSIS — D509 Iron deficiency anemia, unspecified: Secondary | ICD-10-CM | POA: Insufficient documentation

## 2018-11-06 MED ORDER — VITAMIN D (ERGOCALCIFEROL) 1.25 MG (50000 UNIT) PO CAPS: 50000.0000 [IU] | ORAL_CAPSULE | ORAL | 1 refills | Status: AC

## 2018-11-06 MED ORDER — APIXABAN 5 MG PO TABS: 5.0000 mg | ORAL_TABLET | Freq: Two times a day (BID) | ORAL | 1 refills | Status: AC

## 2018-11-06 MED ORDER — AMIODARONE HCL 200 MG PO TABS: 200.0000 mg | ORAL_TABLET | Freq: Two times a day (BID) | ORAL | 0 refills | Status: DC

## 2018-11-06 MED ORDER — FREESTYLE LIBRE 14 DAY READER DEVI: 1.0000 | Freq: Every day | 2 refills | Status: AC

## 2018-11-06 MED ORDER — TORSEMIDE 100 MG PO TABS: 100.0000 mg | ORAL_TABLET | Freq: Every day | ORAL | 1 refills | Status: DC

## 2018-11-06 MED ORDER — INSULIN GLARGINE 100 UNIT/ML SOLOSTAR PEN: 5.0000 [IU] | PEN_INJECTOR | Freq: Every day | SUBCUTANEOUS | 3 refills | Status: AC

## 2018-11-06 MED ORDER — POTASSIUM CHLORIDE CRYS ER 20 MEQ PO TBCR: 40.0000 meq | EXTENDED_RELEASE_TABLET | Freq: Every day | ORAL | 3 refills | Status: DC

## 2018-11-06 MED ORDER — FREESTYLE LIBRE 14 DAY SENSOR MISC: 1.0000 | Freq: Four times a day (QID) | 2 refills | Status: AC | PRN

## 2018-11-06 NOTE — Assessment & Plan Note (Signed)
Continue potassium chloride SA 20 mEq 2 tablets daily. Instructed about warning signs. BMP ordered to be done at his cardiologist office during next visit, 11/2018.

## 2018-11-06 NOTE — Assessment & Plan Note (Signed)
Overall adequately controlled, I would like BP under 130/80 ideally. No changes in current management. Continue low-salt diet. Continue monitoring BP regularly.

## 2018-11-06 NOTE — Assessment & Plan Note (Signed)
Nephrology referral placed. Glucose random BP must be better controlled. Continue low-salt diet. Adequate hydration. Continue avoiding NSAIDs.

## 2018-11-06 NOTE — Progress Notes (Signed)
Virtual Visit via Video Note   I connected with Mr Shahin on 11/08/18 at 12:00 PM EDT by a video enabled telemedicine application and verified that I am speaking with the correct person using two identifiers.  Location patient: home Location provider:home office Persons participating in the virtual visit: patient, provider  I discussed the limitations of evaluation and management by telemedicine and the availability of in person appointments. The patient expressed understanding and agreed to proceed.   HPI: Mr Motyka is a 43 yo with Hx of DM II,CHF,CKD IV,atrial fib,HTN,and diabetic foot ulcer among some. He was last seen on 08/07/18. Since her last OV he has been hospitalized, 08/29/18. Dx with foot osteomyelitis andAcute on chronic combined CHF.Marland Kitchen He underwent left foot debridement in 06/2018. Still using kneeling scooter. He is not on oral abx. Denies fever,chills,fatigue,or body aches.   He is following with Dr Sharol Given. According to pt,wound is healing well.  He is not longer having [place] services.  CKD IV: According to pt, nephrology appt was cancelled because COVID 19 pandemia, he was supposed to have labs done. He is requesting another referral to nephrologist. He has not noted gross hematuria,decreased urine output,or foam in urine.  Vit D deficiency, he is on Ergocalciferol 50,000 U weekly. 25 OH vit D low at 19.7 in 07/2018.   HTN,he is on Bidil 20-35.5 mg bid,Coreg 25 mg bid. Denies headache,visual changes, CP,dyspnea,orthopnea (sleeping on a pillow),PND,or worsening LE edema.   HypoK+,he is on KCL 20 meq bid. He has not noted palpitations or muscle cramps.  Lab Results  Component Value Date   CREATININE 3.74 (H) 09/06/2018   BUN 73 (H) 09/06/2018   NA 137 09/06/2018   K 4.4 09/06/2018   CL 102 09/06/2018   CO2 25 09/06/2018    CHF with LVEF  20-25%,diastolic dysfunction,and diffuse LV hypokinesis  (08/30/18). He is on Torsemide 100 mg daily Paroxysmal atrial fib, he is  on Eliquis 5 mg bid.  DM II: He is on Lantus 5 U daily and Novolog sliding scale. In average he takes Novolog 2-3 U daily. Denies episodes of hypoglycemia. BS today 134 after breakfast.  Denies abdominal pain, nausea,vomiting, polydipsia,polyuria, or polyphagia.  Last eye exam > a year ago.  Lab Results  Component Value Date   HGBA1C 8.5 (H) 06/10/2018   Anemia,irone deficiency: He is on Fe Sulfate 325 mg bid. He has not noted nose bleed,gum bleeding,blood in stool or more bruising than usual. Negative for pica.  Lab Results  Component Value Date   WBC 5.3 09/05/2018   HGB 11.8 (L) 09/05/2018   HCT 36.6 (L) 09/05/2018   MCV 89.9 09/05/2018   PLT 262 09/05/2018    ROS: See pertinent positives and negatives per HPI.  Past Medical History:  Diagnosis Date  . 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 Chronic kidney disease   . Diabetes mellitus    adult-onset, type 2  . Exogenous obesity   . GERD (gastroesophageal reflux disease)   . Hypertension   . Left foot infection 06/21/2018  . Osteomyelitis of foot, left, acute ([place]) 07/04/2018    Past Surgical History:  Procedure Laterality Date  . ANKLE SURGERY  2003   plate and screws  . APPLICATION OF WOUND VAC Left 06/12/2018   Procedure: APPLICATION OF WOUND VAC;  Surgeon: Mcarthur Rossetti, MD;  Location: [place];  Service: Orthopedics;  Laterality: Left;  . CARDIAC CATHETERIZATION  02/16/2012   50% LAD lesion in  mid vessel, otherwise no significant obstructive CAD  . HIP PINNING  1990   bilateral  . I&D EXTREMITY Left 06/10/2018   Procedure: IRRIGATION AND DEBRIDEMENT OF FOOT;  Surgeon: Mcarthur Rossetti, MD;  Location: [facility];  Service: Orthopedics;  Laterality: Left;  . I&D EXTREMITY Left 06/12/2018   Procedure: REPEAT IRRIGATION AND DEBRIDEMENT LEFT FOOT;  Surgeon: Mcarthur Rossetti, MD;  Location: [facility];  Service: Orthopedics;  Laterality: Left;  . I&D EXTREMITY Left  07/04/2018   Procedure: DEBRIDEMENT OF LEFT FOOT;  Surgeon: Newt Minion, MD;  Location: [facility];  Service: Orthopedics;  Laterality: Left;  . IR GENERIC HISTORICAL  09/01/2016   IR FLUORO GUIDE CV LINE RIGHT 09/01/2016 Marybelle Killings, MD [facility]  . IR GENERIC HISTORICAL  09/01/2016   IR US GUIDE VASC ACCESS RIGHT 09/01/2016 Marybelle Killings, MD [facility]  . IR GENERIC HISTORICAL  09/16/2016   IR REMOVAL TUN CV CATH W/O FL 09/16/2016 Arne [place], MD [facility]  . KNEE ARTHROSCOPY Left 08/30/2016   Procedure: ARTHROSCOPY KNEE;  Surgeon: Renette Butters, MD;  Location: [place];  Service: Orthopedics;  Laterality: Left;  . LEFT AND RIGHT HEART CATHETERIZATION WITH CORONARY ANGIOGRAM N/A 02/16/2012   Procedure: LEFT AND RIGHT HEART CATHETERIZATION WITH CORONARY ANGIOGRAM;  Surgeon: Pixie Casino, MD;  Location: [facility] CATH LAB;  Service: Cardiovascular;  Laterality: N/A;  . TRANSTHORACIC ECHOCARDIOGRAM  08/31/2012   EF 35-40%, no significant wall motion abnormalities, diastolic relaxation abnormality - no longer needed LifeVest; LV cavity size mod dilated with mod conc hypertrophy, systolic function mod reduced; mild MR, LA mod dilated    Family History  Problem Relation Age of Onset  . Coronary artery disease Brother        CABG at 29, DM  . Heart disease Brother 22       CAD  . Diabetes Mother   . Hypertension Mother   . Hyperlipidemia Mother   . Heart disease Mother   . Diabetes Father   . Hypertension Father     Social History   Socioeconomic History  . Marital status: Single    Spouse name: Not on file  . Number of children: 6  . Years of education: 34  . Highest education level: Not on file  Occupational History  . Occupation: Brewing technologist    Comment: [name]  . Occupation: newpaper delivery    Employer: [employer]  Social Needs  . Financial resource strain: Not on file  . Food insecurity:    Worry: Not on file    Inability: Not on file  .  Transportation needs:    Medical: Not on file    Non-medical: Not on file  Tobacco Use  . Smoking status: Never Smoker  . Smokeless tobacco: Never Used  Substance and Sexual Activity  . Alcohol use: No  . Drug use: No  . Sexual activity: Yes    Birth control/protection: Condom  Lifestyle  . Physical activity:    Days per week: Not on file    Minutes per session: Not on file  . Stress: Not on file  Relationships  . Social connections:    Talks on phone: Not on file    Gets together: Not on file    Attends religious service: Not on file    Active member of club or organization: Not on file    Attends meetings of clubs or organizations: Not on file  Relationship status: Not on file  . Intimate partner violence:    Fear of current or ex partner: Not on file    Emotionally abused: Not on file    Physically abused: Not on file    Forced sexual activity: Not on file  Other Topics Concern  . Not on file  Social History Narrative  . Not on file    Current Outpatient Medications:  .  acetaminophen (TYLENOL) 500 MG tablet, Take 1,000 mg by mouth every 6 (six) hours as needed for moderate pain or headache., Disp: , Rfl:  .  amiodarone (PACERONE) 200 MG tablet, Take 1 tablet (200 mg total) by mouth 2 (two) times daily., Disp: 60 tablet, Rfl: 0 .  apixaban (ELIQUIS) 5 MG TABS tablet, Take 1 tablet (5 mg total) by mouth 2 (two) times daily., Disp: 180 tablet, Rfl: 1 .  atorvastatin (LIPITOR) 10 MG tablet, Take 1 tablet (10 mg total) by mouth daily with supper., Disp: 90 tablet, Rfl: 3 .  carvedilol (COREG) 25 MG tablet, Take 1 tablet (25 mg total) by mouth 2 (two) times daily with a meal., Disp: 180 tablet, Rfl: 1 .  Continuous Blood Gluc Receiver (FREESTYLE LIBRE 14 DAY READER) DEVI, 1 Device by Does not apply route daily., Disp: 6 Device, Rfl: 2 .  Continuous Blood Gluc Sensor (FREESTYLE LIBRE 14 DAY SENSOR) MISC, Inject 1 Device into the skin 4 (four) times daily as needed., Disp: 6  each, Rfl: 2 .  Ferrous Sulfate (IRON) 325 (65 Fe) MG TABS, Take 1 tablet (325 mg total) by mouth 2 (two) times daily with a meal., Disp: 60 tablet, Rfl: 0 .  gabapentin (NEURONTIN) 300 MG capsule, Take 1 capsule (300 mg total) by mouth at bedtime., Disp: 90 capsule, Rfl: 3 .  glucose blood (FREESTYLE TEST STRIPS) test strip, Use as instructed, Disp: 100 each, Rfl: 0 .  glucose monitoring kit (FREESTYLE) monitoring kit, 1 each by Does not apply route 4 (four) times daily - after meals and at bedtime. 1 month Diabetic Testing Supplies for QAC-QHS accuchecks., Disp: 1 each, Rfl: 1 .  insulin aspart (NOVOLOG FLEXPEN) 100 UNIT/ML FlexPen, Inject 0-15 Units into the skin 3 (three) times daily with meals. 0-15U/Subcu/TIDw/meals//CBG70-120:0U/CBG121-150:2U/CBG151-200:3U/CBG201-250:5U/CBG251-300:8U/CBG301-350:11U/CBG351-400:15U//CBG>400:Call MD, Disp: 15 mL, Rfl: 5 .  Insulin Glargine (LANTUS) 100 UNIT/ML Solostar Pen, Inject 5 Units into the skin at bedtime., Disp: 9 mL, Rfl: 3 .  Insulin Pen Needle 32G X 8 MM MISC, Use as directed, Disp: 100 each, Rfl: 0 .  isosorbide-hydrALAZINE (BIDIL) 20-37.5 MG tablet, Take 1 tablet by mouth 2 (two) times daily., Disp: 270 tablet, Rfl: 1 .  Lancets (FREESTYLE) lancets, Use as instructed, Disp: 100 each, Rfl: 0 .  Multiple Vitamin (MULTIVITAMIN WITH MINERALS) TABS tablet, Take 1 tablet by mouth daily., Disp: 30 tablet, Rfl: 0 .  oxyCODONE-acetaminophen (PERCOCET/ROXICET) 5-325 MG tablet, Take 1 tablet by mouth every 8 (eight) hours as needed for moderate pain or severe pain., Disp: 20 tablet, Rfl: 0 .  potassium chloride SA (K-DUR) 20 MEQ tablet, Take 2 tablets (40 mEq total) by mouth daily., Disp: 60 tablet, Rfl: 3 .  silver sulfADIAZINE (SILVADENE) 1 % cream, Apply topically daily. Silvadene plus dry dressing to plantar ulcer left foot.  Change daily., Disp: 50 g, Rfl: 0 .  torsemide (DEMADEX) 100 MG tablet, Take 1 tablet (100 mg total) by mouth daily., Disp: 90 tablet,  Rfl: 1 .  Vitamin D, Ergocalciferol, (DRISDOL) 1.25 MG (50000 UT) CAPS capsule, Take 1 capsule (50,000  Units total) by mouth every 7 (seven) days., Disp: 12 capsule, Rfl: 1  EXAM:  VITALS per patient if applicable:BP 578/46   Resp 12   SpO2 98%   GENERAL: alert, oriented, appears well and in no acute distress  HEENT: atraumatic,normocephalic, conjunctiva clear, no obvious facial abnormalities on inspection.  LUNGS: on inspection no signs of respiratory distress, breathing rate appears normal, no obvious gross SOB, gasping or wheezing  CV: no obvious cyanosis  MS: moves all visible extremities without noticeable abnormality  PSYCH/NEURO: pleasant and cooperative, no obvious depression or anxiety, speech and thought processing grossly intact  ASSESSMENT AND PLAN:  Discussed the following assessment and plan:  Orders Placed This Encounter  Procedures  . Basic metabolic panel  . Hemoglobin A1c  . Fructosamine  . CBC  . Ambulatory referral to Nephrology    Iron deficiency anemia, unspecified iron deficiency anemia type - Plan: CBC Anemia of chronic disease. No changes in Fe Sulfate dose. Further recommendations will be given according to CBC results.  Hypovitaminosis D Continue ergocalciferol 50,000 units weekly. Vitamin D dose will be adjusted according to 25 OH vitamin D results.  Hypokalemia Continue potassium chloride SA 20 mEq 2 tablets daily. Instructed about warning signs. BMP ordered to be done at his cardiologist office during next visit, 11/2018.  CKD (chronic kidney disease), stage IV [facility]) Nephrology referral placed. Glucose random BP must be better controlled. Continue low-salt diet. Adequate hydration. Continue avoiding NSAIDs.  Hypertensive heart disease with heart failure ([place]) Overall adequately controlled, I would like BP under 130/80 ideally. No changes in current management. Continue low-salt diet. Continue monitoring BP regularly.  Type 2  diabetes mellitus with vascular disease ([place]) Problem has not been adequately controlled. A1c goal < 7.0. He is overdue for eye exam. We discussed possible complications of elevated glucose. No changes in current management. Caution with hypoglycemia. Adequate foot care.   He has missed f/u appt and has not been complaint with dietary recommendations. We discussed the importance of keeping follow up appt and following dietary recommendations. He has appt arranged with cardiologist, Dr Haroldine Laws, on 11/19/18 and with Dr Sharol Given 11/08/18.  Lab orders placed to be done at cardio's office during follow up.   I discussed the assessment and treatment plan with the patient. He was provided an opportunity to ask questions and all were answered. The patient agreed with the plan and demonstrated an understanding of the instructions.   The patient was advised to call back or seek an in-person evaluation if the symptoms worsen or if the condition fails to improve as anticipated.  Return in about 4 months (around 03/09/2019) for DM II,HTN.    Betty [name], MD

## 2018-11-06 NOTE — Assessment & Plan Note (Signed)
Problem has not been adequately controlled. A1c goal < 7.0. He is overdue for eye exam. We discussed possible complications of elevated glucose. No changes in current management. Caution with hypoglycemia. Adequate foot care.

## 2018-11-06 NOTE — Assessment & Plan Note (Signed)
Continue ergocalciferol 50,000 units weekly. Vitamin D dose will be adjusted according to 25 OH vitamin D results.

## 2018-11-08 ENCOUNTER — Ambulatory Visit: Payer: BLUE CROSS/BLUE SHIELD | Admitting: Orthopedic Surgery

## 2018-11-14 ENCOUNTER — Other Ambulatory Visit: Payer: Self-pay | Admitting: *Deleted

## 2018-11-14 DIAGNOSIS — E1159 Type 2 diabetes mellitus with other circulatory complications: Secondary | ICD-10-CM

## 2018-11-14 MED ORDER — INSULIN ASPART 100 UNIT/ML FLEXPEN: 0.0000 [IU] | PEN_INJECTOR | Freq: Three times a day (TID) | SUBCUTANEOUS | 5 refills | Status: AC

## 2018-11-19 ENCOUNTER — Other Ambulatory Visit: Payer: Self-pay

## 2018-11-19 ENCOUNTER — Ambulatory Visit (HOSPITAL_COMMUNITY)
Admission: RE | Admit: 2018-11-19 | Payer: BLUE CROSS/BLUE SHIELD | Source: Ambulatory Visit | Admitting: Internal Medicine

## 2018-12-04 ENCOUNTER — Ambulatory Visit: Payer: BLUE CROSS/BLUE SHIELD | Admitting: Orthopedic Surgery

## 2018-12-10 ENCOUNTER — Encounter: Payer: Self-pay | Admitting: Orthopedic Surgery

## 2018-12-10 ENCOUNTER — Other Ambulatory Visit: Payer: Self-pay

## 2018-12-10 ENCOUNTER — Ambulatory Visit (INDEPENDENT_AMBULATORY_CARE_PROVIDER_SITE_OTHER): Payer: BC Managed Care – PPO | Admitting: Orthopedic Surgery

## 2018-12-10 VITALS — Ht 71.0 in | Wt 315.0 lb

## 2018-12-10 DIAGNOSIS — L97426 Non-pressure chronic ulcer of left heel and midfoot with bone involvement without evidence of necrosis: Secondary | ICD-10-CM

## 2018-12-10 DIAGNOSIS — E11621 Type 2 diabetes mellitus with foot ulcer: Secondary | ICD-10-CM | POA: Diagnosis not present

## 2018-12-13 ENCOUNTER — Encounter: Payer: Self-pay | Admitting: Orthopedic Surgery

## 2018-12-13 NOTE — Progress Notes (Signed)
Office Visit Note   Patient: Joshua Massey           Date of Birth: 1976/01/29           MRN: [id number] Visit Date: 12/10/2018              Requested by: [name], [name], MD [id number] [place],  [place] [address] PCP: [name], [name], MD  Chief Complaint  Patient presents with  . Left Foot - Routine Post Op    07/04/2018 left foot debridement and STSG application       HPI: Patient is a 43 year old gentleman who is about 5 months status post debridement and skin grafting to the left foot.  Patient is nonweightbearing in a kneeling scooter currently wearing a compression stocking.  Patient states he has a small open area at the incision and a small amount of clear drainage.  He states he feels well has no concerns.  Assessment & Plan: Visit Diagnoses:  1. Diabetic ulcer of left midfoot associated with type 2 diabetes mellitus, with bone involvement without evidence of necrosis ([place])     Plan: The wound bed had healthy beefy granulation tissue after debridement.  Continue with wound care continue with protected weightbearing.  Follow-Up Instructions: Return in about 4 weeks (around 01/07/2019).   Ortho Exam  Patient is alert, oriented, no adenopathy, well-dressed, normal affect, normal respiratory effort. Examination patient has a persistent ulcer on the plantar aspect the left foot.  After informed consent a 10 blade knife was used to debride the skin and soft tissue back to healthy viable bleeding granulation tissue this was touched with silver nitrate the ulcer is 2 cm in diameter 2 mm deep there is no exposed bone or tendon there is no drainage.  Remainder of the foot shows no cellulitis no fluctuance no signs of active Charcot arthropathy or infection.  Imaging: No results found. No images are attached to the encounter.  Labs: Lab Results  Component Value Date   HGBA1C 8.5 (H) 06/10/2018   HGBA1C 11.5 (H) 11/28/2017   HGBA1C 9.7 (H) 12/20/2016   ESRSEDRATE  5 08/30/2018   CRP <0.8 08/30/2018   REPTSTATUS 06/15/2018 FINAL 06/10/2018   GRAMSTAIN  06/10/2018    RARE WBC PRESENT, PREDOMINANTLY PMN FEW GRAM POSITIVE COCCI IN PAIRS RARE GRAM NEGATIVE RODS    CULT  06/10/2018    FEW MORGANELLA MORGANII MODERATE BACTEROIDES THETAIOTAOMICRON BETA LACTAMASE POSITIVE Performed at [facility], [place] [address], [place], [place] [address]    Douglas MORGANII 06/10/2018     Lab Results  Component Value Date   ALBUMIN 2.9 (L) 09/06/2018   ALBUMIN 2.8 (L) 09/05/2018   ALBUMIN 2.6 (L) 09/04/2018    Body mass index is 43.93 kg/m.  Orders:  No orders of the defined types were placed in this encounter.  No orders of the defined types were placed in this encounter.    Procedures: No procedures performed  Clinical Data: No additional findings.  ROS:  All other systems negative, except as noted in the HPI. Review of Systems  Objective: Vital Signs: Ht 5\' 11"  (1.803 m)   Wt (!) 315 lb (142.9 kg)   BMI 43.93 kg/m   Specialty Comments:  No specialty comments available.  PMFS History: Patient Active Problem List   Diagnosis Date Noted  . Anemia, iron deficiency 11/06/2018  . Heart failure ([place]) 08/31/2018  . Acute on chronic combined systolic and diastolic CHF (congestive heart failure) (  [place]) 08/29/2018  . S/P split thickness skin graft 08/02/2018  . Subacute osteomyelitis, left ankle and foot ([place])   . Non-compliant patient 12/18/2017  . 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 Hypovitaminosis D 11/27/2017  . Diabetic foot ulcer ([place]) 11/27/2017  . Cellulitis in diabetic foot ([place]) 11/27/2017  . Acute heart failure ([place]) 11/27/2017  . Volume overload 11/27/2017  . Hepatopathy 11/27/2017  . Hypokalemia 05/31/2017  . Diabetic peripheral neuropathy ([place]) 12/20/2016  . Hyperlipidemia associated with type 2 diabetes mellitus ([place]) 11/27/2016  .  Shingles 10/11/2016  . Status post peripherally inserted central catheter (PICC) central line placement 09/12/2016  . Medication monitoring encounter 09/12/2016  . Arthritis, septic ([place])   . CKD (chronic kidney disease), stage IV ([place])   . AKI (acute kidney injury) ([place])   . Peripheral edema   . Acute renal failure ([place]) 08/17/2016  . Acute systolic CHF (congestive heart failure) ([place]) 08/17/2016  . Type 2 diabetes mellitus with vascular disease ([place]) 10/21/2013  . Chronic systolic heart failure ([place]) 02/27/2012  . Coronary artery disease, 50% LAD at cath 02/16/12 02/23/2012  . Family history of coronary artery disease 02/23/2012  . Cardiogenic shock ([place]) 02/17/2012  . NSVT (nonsustained ventricular tachycardia) ([place]) 02/16/2012  . Obesity 02/16/2012  . Acute on chronic systolic CHF (congestive heart failure) ([place]) 02/16/2012  . CHF (congestive heart failure) ([place]) 02/15/2012  . Hypertension 02/15/2012  . Non-ischemic cardiomyopathy, EF < 20% 02/15/2012  . TINEA CRURIS 09/25/2009  . ALLERGIC RHINITIS CAUSE UNSPECIFIED 09/25/2009  . SCROTAL ABSCESS 09/17/2009  . Hypertensive heart disease with heart failure ([place]) 01/26/2009   Past Medical History:  Diagnosis Date  . 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 Chronic kidney disease   . Diabetes mellitus    adult-onset, type 2  . Exogenous obesity   . GERD (gastroesophageal reflux disease)   . Hypertension   . Left foot infection 06/21/2018  . Osteomyelitis of foot, left, acute ([place]) 07/04/2018    Family History  Problem Relation Age of Onset  . Coronary artery disease Brother        CABG at 78, DM  . Heart disease Brother 45       CAD  . Diabetes Mother   . Hypertension Mother   . Hyperlipidemia Mother   . Heart disease Mother   . Diabetes Father   . Hypertension Father     Past Surgical History:  Procedure Laterality Date  . ANKLE SURGERY  2003   plate and screws  . APPLICATION OF WOUND VAC  Left 06/12/2018   Procedure: APPLICATION OF WOUND VAC;  Surgeon: Mcarthur Rossetti, MD;  Location: [place];  Service: Orthopedics;  Laterality: Left;  . CARDIAC CATHETERIZATION  02/16/2012   50% LAD lesion in mid vessel, otherwise no significant obstructive CAD  . HIP PINNING  1990   bilateral  . I&D EXTREMITY Left 06/10/2018   Procedure: IRRIGATION AND DEBRIDEMENT OF FOOT;  Surgeon: Mcarthur Rossetti, MD;  Location: [place];  Service: Orthopedics;  Laterality: Left;  . I&D EXTREMITY Left 06/12/2018   Procedure: REPEAT IRRIGATION AND DEBRIDEMENT LEFT FOOT;  Surgeon: Mcarthur Rossetti, MD;  Location: [place];  Service: Orthopedics;  Laterality: Left;  . I&D EXTREMITY Left 07/04/2018   Procedure: DEBRIDEMENT OF LEFT FOOT;  Surgeon: Newt Minion, MD;  Location: [place];  Service: Orthopedics;  Laterality: Left;  . IR GENERIC HISTORICAL  09/01/2016   IR FLUORO GUIDE CV LINE RIGHT 09/01/2016 Marybelle Killings, MD [facility]  . IR GENERIC HISTORICAL  09/01/2016   IR US GUIDE VASC ACCESS RIGHT 09/01/2016 Marybelle Killings, MD [facility]  . IR GENERIC HISTORICAL  09/16/2016   IR REMOVAL TUN CV CATH W/O FL 09/16/2016 Arne [place], MD [facility]  . KNEE ARTHROSCOPY Left 08/30/2016   Procedure: ARTHROSCOPY KNEE;  Surgeon: Renette Butters, MD;  Location: [place];  Service: Orthopedics;  Laterality: Left;  . LEFT AND RIGHT HEART CATHETERIZATION WITH CORONARY ANGIOGRAM N/A 02/16/2012   Procedure: LEFT AND RIGHT HEART CATHETERIZATION WITH CORONARY ANGIOGRAM;  Surgeon: Pixie Casino, MD;  Location: [facility] CATH LAB;  Service: Cardiovascular;  Laterality: N/A;  . TRANSTHORACIC ECHOCARDIOGRAM  08/31/2012   EF 35-40%, no significant wall motion abnormalities, diastolic relaxation abnormality - no longer needed LifeVest; LV cavity size mod dilated with mod conc hypertrophy, systolic function mod reduced; mild MR, LA mod dilated   Social History   Occupational History  . Occupation: Brewing technologist    Comment:  [name]  . Occupation: newpaper delivery    Employer: [employer]  Tobacco Use  . Smoking status: Never Smoker  . Smokeless tobacco: Never Used  Substance and Sexual Activity  . Alcohol use: No  . Drug use: No  . Sexual activity: Yes    Birth control/protection: Condom

## 2018-12-25 ENCOUNTER — Other Ambulatory Visit: Payer: Self-pay | Admitting: Family Medicine

## 2019-01-02 ENCOUNTER — Other Ambulatory Visit: Payer: Self-pay | Admitting: Family Medicine

## 2019-01-03 ENCOUNTER — Telehealth (HOSPITAL_COMMUNITY): Payer: Self-pay | Admitting: *Deleted

## 2019-01-03 NOTE — Telephone Encounter (Signed)
Pt called stating his bidil was over $300 and asked if we had samples while they work on patient assistance. Samples left at the front desk.

## 2019-01-07 ENCOUNTER — Ambulatory Visit: Payer: BC Managed Care – PPO | Admitting: Orthopedic Surgery

## 2019-01-29 ENCOUNTER — Ambulatory Visit: Payer: BC Managed Care – PPO | Admitting: Family

## 2019-02-20 ENCOUNTER — Ambulatory Visit: Payer: BC Managed Care – PPO | Admitting: Family

## 2019-02-21 ENCOUNTER — Other Ambulatory Visit: Payer: Self-pay | Admitting: Family Medicine

## 2019-02-22 ENCOUNTER — Ambulatory Visit: Payer: BC Managed Care – PPO | Admitting: Family

## 2019-03-27 ENCOUNTER — Ambulatory Visit: Payer: BC Managed Care – PPO | Admitting: Family

## 2019-04-09 ENCOUNTER — Other Ambulatory Visit: Payer: Self-pay | Admitting: Family Medicine

## 2019-04-09 ENCOUNTER — Other Ambulatory Visit (INDEPENDENT_AMBULATORY_CARE_PROVIDER_SITE_OTHER): Payer: Self-pay | Admitting: Physician Assistant

## 2019-05-09 ENCOUNTER — Telehealth (HOSPITAL_COMMUNITY): Payer: Self-pay

## 2019-05-09 ENCOUNTER — Telehealth (HOSPITAL_COMMUNITY): Payer: Self-pay | Admitting: Cardiology

## 2019-05-09 NOTE — Telephone Encounter (Signed)
Pt called to request samples of bidil  Samples provided and left in the front office

## 2019-05-09 NOTE — Telephone Encounter (Signed)
Medication Samples have been provided to the patient.  Drug name: bidil       Strength: 20/37.5mg         Qty: 36  LOT: [id number]  Exp.Date: 1/22  Dosing instructions: 1 bid  The patient has been instructed regarding the correct time, dose, and frequency of taking this medication, including desired effects and most common side effects.   Valeda Malm 4:14 PM 05/09/2019  Samples at [facility]

## 2019-05-17 ENCOUNTER — Telehealth: Payer: Self-pay | Admitting: Family Medicine

## 2019-05-20 NOTE — Telephone Encounter (Signed)
Patient has an appointment scheduled for 05/22/2019.  [place] for refills?

## 2019-05-20 NOTE — Telephone Encounter (Signed)
Pt stated he was advised that the refill request was denied. Pt stated he has started to retain fluid and if the refill request is not approved he may have to go to the ED.

## 2019-05-20 NOTE — Telephone Encounter (Signed)
Patient checking  On the status of torsemide (DEMADEX) 20 MG tablet request advised the patient please allow 48 to 72 hour turn around time, patient states he's out, please advise   [facility] ([place]), [place] - [address]

## 2019-05-21 ENCOUNTER — Inpatient Hospital Stay (HOSPITAL_COMMUNITY)
Admission: EM | Admit: 2019-05-21 | Discharge: 2019-06-04 | DRG: 291 | Disposition: A | Discharge status: Home Health | Payer: BC Managed Care – PPO | Attending: Family Medicine | Admitting: Family Medicine

## 2019-05-21 ENCOUNTER — Other Ambulatory Visit: Payer: Self-pay

## 2019-05-21 ENCOUNTER — Other Ambulatory Visit: Payer: Self-pay | Admitting: Family Medicine

## 2019-05-21 ENCOUNTER — Emergency Department (HOSPITAL_COMMUNITY): Payer: BC Managed Care – PPO

## 2019-05-21 ENCOUNTER — Encounter (HOSPITAL_COMMUNITY): Payer: Self-pay | Admitting: Emergency Medicine

## 2019-05-21 DIAGNOSIS — N189 Chronic kidney disease, unspecified: Secondary | ICD-10-CM

## 2019-05-21 DIAGNOSIS — Z7901 Long term (current) use of anticoagulants: Secondary | ICD-10-CM

## 2019-05-21 DIAGNOSIS — N184 Chronic kidney disease, stage 4 (severe): Secondary | ICD-10-CM | POA: Diagnosis present

## 2019-05-21 DIAGNOSIS — Z833 Family history of diabetes mellitus: Secondary | ICD-10-CM | POA: Diagnosis not present

## 2019-05-21 DIAGNOSIS — R7989 Other specified abnormal findings of blood chemistry: Secondary | ICD-10-CM | POA: Diagnosis not present

## 2019-05-21 DIAGNOSIS — Z9119 Patient's noncompliance with other medical treatment and regimen: Secondary | ICD-10-CM | POA: Diagnosis not present

## 2019-05-21 DIAGNOSIS — Z9114 Patient's other noncompliance with medication regimen: Secondary | ICD-10-CM

## 2019-05-21 DIAGNOSIS — I480 Paroxysmal atrial fibrillation: Secondary | ICD-10-CM | POA: Diagnosis present

## 2019-05-21 DIAGNOSIS — I10 Essential (primary) hypertension: Secondary | ICD-10-CM | POA: Diagnosis not present

## 2019-05-21 DIAGNOSIS — K219 Gastro-esophageal reflux disease without esophagitis: Secondary | ICD-10-CM | POA: Diagnosis present

## 2019-05-21 DIAGNOSIS — I428 Other cardiomyopathies: Secondary | ICD-10-CM | POA: Diagnosis present

## 2019-05-21 DIAGNOSIS — E8779 Other fluid overload: Secondary | ICD-10-CM

## 2019-05-21 DIAGNOSIS — Z8349 Family history of other endocrine, nutritional and metabolic diseases: Secondary | ICD-10-CM | POA: Diagnosis not present

## 2019-05-21 DIAGNOSIS — E1159 Type 2 diabetes mellitus with other circulatory complications: Secondary | ICD-10-CM | POA: Diagnosis present

## 2019-05-21 DIAGNOSIS — N179 Acute kidney failure, unspecified: Secondary | ICD-10-CM | POA: Diagnosis present

## 2019-05-21 DIAGNOSIS — E1142 Type 2 diabetes mellitus with diabetic polyneuropathy: Secondary | ICD-10-CM | POA: Diagnosis present

## 2019-05-21 DIAGNOSIS — Z23 Encounter for immunization: Secondary | ICD-10-CM | POA: Diagnosis not present

## 2019-05-21 DIAGNOSIS — I130 Hypertensive heart and chronic kidney disease with heart failure and stage 1 through stage 4 chronic kidney disease, or unspecified chronic kidney disease: Principal | ICD-10-CM | POA: Diagnosis present

## 2019-05-21 DIAGNOSIS — Z8249 Family history of ischemic heart disease and other diseases of the circulatory system: Secondary | ICD-10-CM | POA: Diagnosis not present

## 2019-05-21 DIAGNOSIS — E1165 Type 2 diabetes mellitus with hyperglycemia: Secondary | ICD-10-CM | POA: Diagnosis not present

## 2019-05-21 DIAGNOSIS — D631 Anemia in chronic kidney disease: Secondary | ICD-10-CM | POA: Diagnosis present

## 2019-05-21 DIAGNOSIS — D508 Other iron deficiency anemias: Secondary | ICD-10-CM | POA: Diagnosis not present

## 2019-05-21 DIAGNOSIS — I5082 Biventricular heart failure: Secondary | ICD-10-CM | POA: Diagnosis present

## 2019-05-21 DIAGNOSIS — E1122 Type 2 diabetes mellitus with diabetic chronic kidney disease: Secondary | ICD-10-CM | POA: Diagnosis present

## 2019-05-21 DIAGNOSIS — E6609 Other obesity due to excess calories: Secondary | ICD-10-CM | POA: Diagnosis not present

## 2019-05-21 DIAGNOSIS — E669 Obesity, unspecified: Secondary | ICD-10-CM | POA: Diagnosis present

## 2019-05-21 DIAGNOSIS — I509 Heart failure, unspecified: Secondary | ICD-10-CM | POA: Diagnosis not present

## 2019-05-21 DIAGNOSIS — I5043 Acute on chronic combined systolic (congestive) and diastolic (congestive) heart failure: Secondary | ICD-10-CM | POA: Diagnosis present

## 2019-05-21 DIAGNOSIS — Z20828 Contact with and (suspected) exposure to other viral communicable diseases: Secondary | ICD-10-CM | POA: Diagnosis present

## 2019-05-21 DIAGNOSIS — I5023 Acute on chronic systolic (congestive) heart failure: Secondary | ICD-10-CM | POA: Diagnosis not present

## 2019-05-21 DIAGNOSIS — Z6841 Body Mass Index (BMI) 40.0 and over, adult: Secondary | ICD-10-CM

## 2019-05-21 DIAGNOSIS — N186 End stage renal disease: Secondary | ICD-10-CM | POA: Diagnosis not present

## 2019-05-21 DIAGNOSIS — I50813 Acute on chronic right heart failure: Secondary | ICD-10-CM

## 2019-05-21 DIAGNOSIS — R5381 Other malaise: Secondary | ICD-10-CM | POA: Diagnosis present

## 2019-05-21 DIAGNOSIS — I2510 Atherosclerotic heart disease of native coronary artery without angina pectoris: Secondary | ICD-10-CM | POA: Diagnosis present

## 2019-05-21 DIAGNOSIS — I4892 Unspecified atrial flutter: Secondary | ICD-10-CM | POA: Diagnosis present

## 2019-05-21 DIAGNOSIS — D638 Anemia in other chronic diseases classified elsewhere: Secondary | ICD-10-CM | POA: Diagnosis not present

## 2019-05-21 LAB — URINALYSIS, ROUTINE W REFLEX MICROSCOPIC
Bacteria, UA: NONE SEEN
Bilirubin Urine: NEGATIVE
Glucose, UA: NEGATIVE mg/dL
Ketones, ur: NEGATIVE mg/dL
Leukocytes,Ua: NEGATIVE
Nitrite: NEGATIVE
Protein, ur: 100 mg/dL — AB
Specific Gravity, Urine: 1.012 (ref 1.005–1.030)
pH: 5 (ref 5.0–8.0)

## 2019-05-21 LAB — CBC
HCT: 37.1 % — ABNORMAL LOW (ref 39.0–52.0)
Hemoglobin: 11.5 g/dL — ABNORMAL LOW (ref 13.0–17.0)
MCH: 29.9 pg (ref 26.0–34.0)
MCHC: 31 g/dL (ref 30.0–36.0)
MCV: 96.6 fL (ref 80.0–100.0)
Platelets: 181 10*3/uL (ref 150–400)
RBC: 3.84 MIL/uL — ABNORMAL LOW (ref 4.22–5.81)
RDW: 17.4 % — ABNORMAL HIGH (ref 11.5–15.5)
WBC: 5 10*3/uL (ref 4.0–10.5)
nRBC: 0 % (ref 0.0–0.2)

## 2019-05-21 LAB — HEPATIC FUNCTION PANEL
ALT: 16 U/L (ref 0–44)
AST: 20 U/L (ref 15–41)
Albumin: 2.8 g/dL — ABNORMAL LOW (ref 3.5–5.0)
Alkaline Phosphatase: 79 U/L (ref 38–126)
Bilirubin, Direct: 0.8 mg/dL — ABNORMAL HIGH (ref 0.0–0.2)
Indirect Bilirubin: 1.2 mg/dL — ABNORMAL HIGH (ref 0.3–0.9)
Total Bilirubin: 2 mg/dL — ABNORMAL HIGH (ref 0.3–1.2)
Total Protein: 7.3 g/dL (ref 6.5–8.1)

## 2019-05-21 LAB — BASIC METABOLIC PANEL
Anion gap: 14 (ref 5–15)
BUN: 94 mg/dL — ABNORMAL HIGH (ref 6–20)
CO2: 23 mmol/L (ref 22–32)
Calcium: 8.4 mg/dL — ABNORMAL LOW (ref 8.9–10.3)
Chloride: 106 mmol/L (ref 98–111)
Creatinine, Ser: 4.95 mg/dL — ABNORMAL HIGH (ref 0.61–1.24)
GFR calc Af Amer: 15 mL/min — ABNORMAL LOW (ref >60)
GFR calc non Af Amer: 13 mL/min — ABNORMAL LOW (ref >60)
Glucose, Bld: 112 mg/dL — ABNORMAL HIGH (ref 70–99)
Potassium: 4.6 mmol/L (ref 3.5–5.1)
Sodium: 143 mmol/L (ref 135–145)

## 2019-05-21 LAB — POC SARS CORONAVIRUS 2 AG -  ED: SARS Coronavirus 2 Ag: NEGATIVE

## 2019-05-21 LAB — BRAIN NATRIURETIC PEPTIDE: B Natriuretic Peptide: 1296.3 pg/mL — ABNORMAL HIGH (ref 0.0–100.0)

## 2019-05-21 MED ORDER — INSULIN ASPART 100 UNIT/ML ~~LOC~~ SOLN: 0.0000 [IU] | Freq: Three times a day (TID) | SUBCUTANEOUS | Status: DC

## 2019-05-21 MED ORDER — INSULIN ASPART 100 UNIT/ML ~~LOC~~ SOLN
0.0000 [IU] | Freq: Three times a day (TID) | SUBCUTANEOUS | Status: DC
  Administered 2019-05-23 – 2019-06-03 (×9): 3 [IU] via SUBCUTANEOUS

## 2019-05-21 MED ORDER — SODIUM CHLORIDE 0.9 % IV SOLN: 250.0000 mL | INTRAVENOUS | Status: DC | PRN

## 2019-05-21 MED ORDER — POTASSIUM CHLORIDE CRYS ER 10 MEQ PO TBCR
10.0000 meq | EXTENDED_RELEASE_TABLET | Freq: Every day | ORAL | Status: DC
  Administered 2019-05-22 – 2019-06-04 (×14): 10 meq via ORAL
  Filled 2019-05-21 (×14): qty 1

## 2019-05-21 MED ORDER — OXYCODONE-ACETAMINOPHEN 5-325 MG PO TABS: 1.0000 | ORAL_TABLET | Freq: Three times a day (TID) | ORAL | Status: DC | PRN

## 2019-05-21 MED ORDER — FUROSEMIDE 10 MG/ML IJ SOLN
60.0000 mg | Freq: Three times a day (TID) | INTRAMUSCULAR | Status: DC
  Administered 2019-05-21 – 2019-05-22 (×3): 60 mg via INTRAVENOUS
  Filled 2019-05-21 (×3): qty 6

## 2019-05-21 MED ORDER — CARVEDILOL 25 MG PO TABS
25.0000 mg | ORAL_TABLET | Freq: Two times a day (BID) | ORAL | Status: DC
  Administered 2019-05-22 – 2019-06-04 (×27): 25 mg via ORAL
  Filled 2019-05-21 (×15): qty 1
  Filled 2019-05-21: qty 2
  Filled 2019-05-21 (×13): qty 1

## 2019-05-21 MED ORDER — ATORVASTATIN CALCIUM 10 MG PO TABS
10.0000 mg | ORAL_TABLET | Freq: Every day | ORAL | Status: DC
  Administered 2019-05-22 – 2019-06-04 (×14): 10 mg via ORAL
  Filled 2019-05-21 (×14): qty 1

## 2019-05-21 MED ORDER — AMIODARONE HCL 200 MG PO TABS
200.0000 mg | ORAL_TABLET | Freq: Two times a day (BID) | ORAL | Status: DC
  Administered 2019-05-21 – 2019-05-22 (×2): 200 mg via ORAL
  Filled 2019-05-21 (×3): qty 1

## 2019-05-21 MED ORDER — TORSEMIDE 20 MG PO TABS: ORAL_TABLET | ORAL | 0 refills | Status: DC

## 2019-05-21 MED ORDER — FERROUS SULFATE 325 (65 FE) MG PO TABS
325.0000 mg | ORAL_TABLET | Freq: Two times a day (BID) | ORAL | Status: DC
  Administered 2019-05-21 – 2019-06-04 (×28): 325 mg via ORAL
  Filled 2019-05-21 (×28): qty 1

## 2019-05-21 MED ORDER — SODIUM CHLORIDE 0.9% FLUSH
3.0000 mL | Freq: Two times a day (BID) | INTRAVENOUS | Status: DC
  Administered 2019-05-22 – 2019-06-01 (×11): 3 mL via INTRAVENOUS

## 2019-05-21 MED ORDER — ACETAMINOPHEN 325 MG PO TABS: 650.0000 mg | ORAL_TABLET | ORAL | Status: DC | PRN

## 2019-05-21 MED ORDER — FUROSEMIDE 10 MG/ML IJ SOLN
100.0000 mg | Freq: Once | INTRAVENOUS | Status: AC
  Administered 2019-05-21: 100 mg via INTRAVENOUS
  Filled 2019-05-21: qty 10

## 2019-05-21 MED ORDER — GABAPENTIN 300 MG PO CAPS: 300.0000 mg | ORAL_CAPSULE | Freq: Every evening | ORAL | Status: DC | PRN

## 2019-05-21 MED ORDER — SODIUM CHLORIDE 0.9% FLUSH: 3.0000 mL | INTRAVENOUS | Status: DC | PRN

## 2019-05-21 MED ORDER — APIXABAN 5 MG PO TABS
5.0000 mg | ORAL_TABLET | Freq: Two times a day (BID) | ORAL | Status: DC
  Administered 2019-05-21 – 2019-06-04 (×28): 5 mg via ORAL
  Filled 2019-05-21 (×30): qty 1

## 2019-05-21 MED ORDER — ONDANSETRON HCL 4 MG/2ML IJ SOLN: 4.0000 mg | Freq: Four times a day (QID) | INTRAMUSCULAR | Status: DC | PRN

## 2019-05-21 MED ORDER — ISOSORB DINITRATE-HYDRALAZINE 20-37.5 MG PO TABS
1.0000 | ORAL_TABLET | Freq: Two times a day (BID) | ORAL | Status: DC
  Administered 2019-05-21 – 2019-05-23 (×4): 1 via ORAL
  Filled 2019-05-21 (×6): qty 1

## 2019-05-21 MED ORDER — SILVER SULFADIAZINE 1 % EX CREA: 1.0000 "application " | TOPICAL_CREAM | Freq: Every day | CUTANEOUS | Status: DC | PRN

## 2019-05-21 MED ORDER — INSULIN GLARGINE 100 UNIT/ML ~~LOC~~ SOLN
1.0000 [IU] | Freq: Every day | SUBCUTANEOUS | Status: DC
  Administered 2019-05-21 – 2019-05-25 (×4): 1 [IU] via SUBCUTANEOUS
  Filled 2019-05-21 (×6): qty 0.01

## 2019-05-21 NOTE — Telephone Encounter (Signed)
Rx for Torsemide 20 mg to continue 3 tabs daily was sent to his pharmacy. Thanks, BJ

## 2019-05-21 NOTE — ED Triage Notes (Signed)
Per [facility] pt coming from home for fluid retention to legs and abdomen. Patients diuretic dosage was increased so patient was taking more medication and ended up running out of his medicine 2 days ago. Patient was told by heart failure clinic to come in.

## 2019-05-21 NOTE — H&P (Signed)
History and Physical    Joshua Massey KWI:[id number] DOB: 06-Aug-1975 DOA: 05/21/2019  PCP: [name], [name], MD (Confirm with patient/family/NH records and if not entered, this has to be entered at [facility] point of entry) Patient coming from: Coming from home  I have personally briefly reviewed patient's old medical records in [place]  Chief Complaint: Fluid rertention  HPI: Joshua Massey is a 43 y.o. male with medical history significant of T2DM, HTN, CKD stage IV , left diabetic foot wound with osteomyelitis s/p debridement in January (followed by Dr.Duda) , paroxysmal A. fib on Eliquis,  and combined systolic and diastolic CHF who presents after not taking his diuretic for 2 days.  Patient reports he ran out of his torsemide 2 days ago. He was taking 60 mg daily.He was recently prescribe a higher dose of 100 mg daily which he has not picked up.  He is followed in the heart failure clinic with last virtual visit with Dr. Sung Amabile in June '20. His last echo  March 12,202 revealed EF of  20 to 24% with diastolic dysfunction as well.  He eports he ran out of batteries for his scale at home and is not sure how much weight gain he has had recently. He denies any recent illness, infectious symptoms, shortness of breath or chest pain. He does endorse increased peripheral edema and increased abdominal girth with tightness.  ED Course: Hemodynamically stable in the ED. Lab revealed K 3.2, BNP 1,296, CXR with mild pulmonary edema. Exam revealed increased peripheral edema. He is referred to [facility] for admission for mgt of acute on chronic systolic CHF.  Review of Systems: As per HPI otherwise 10 point review of systems negative.    Past Medical History:  Diagnosis Date   CHF (congestive heart failure) (HCC)    nonischemic cardiopathy   Chronic kidney disease    Diabetes mellitus    adult-onset, type 2   Exogenous obesity    GERD (gastroesophageal reflux disease)    Hypertension     Left foot infection 06/21/2018   Osteomyelitis of foot, left, acute ([place]) 07/04/2018    Past Surgical History:  Procedure Laterality Date   ANKLE SURGERY  2003   plate and screws   APPLICATION OF WOUND VAC Left 06/12/2018   Procedure: APPLICATION OF WOUND VAC;  Surgeon: Mcarthur Rossetti, MD;  Location: [place];  Service: Orthopedics;  Laterality: Left;   CARDIAC CATHETERIZATION  02/16/2012   50% LAD lesion in mid vessel, otherwise no significant obstructive CAD   HIP PINNING  1990   bilateral   I&D EXTREMITY Left 06/10/2018   Procedure: IRRIGATION AND DEBRIDEMENT OF FOOT;  Surgeon: Mcarthur Rossetti, MD;  Location: [place];  Service: Orthopedics;  Laterality: Left;   I&D EXTREMITY Left 06/12/2018   Procedure: REPEAT IRRIGATION AND DEBRIDEMENT LEFT FOOT;  Surgeon: Mcarthur Rossetti, MD;  Location: [place];  Service: Orthopedics;  Laterality: Left;   I&D EXTREMITY Left 07/04/2018   Procedure: DEBRIDEMENT OF LEFT FOOT;  Surgeon: Newt Minion, MD;  Location: [place];  Service: Orthopedics;  Laterality: Left;   IR GENERIC HISTORICAL  09/01/2016   IR FLUORO GUIDE CV LINE RIGHT 09/01/2016 Marybelle Killings, MD [facility]INTER[facility]   IR GENERIC HISTORICAL  09/01/2016   IR US GUIDE VASC ACCESS RIGHT 09/01/2016 Marybelle Killings, MD [facility]   IR GENERIC HISTORICAL  09/16/2016   IR REMOVAL TUN CV CATH W/O FL 09/16/2016 Arne Cleveland, MD [facility]   KNEE  ARTHROSCOPY Left 08/30/2016   Procedure: ARTHROSCOPY KNEE;  Surgeon: Renette Butters, MD;  Location: [place];  Service: Orthopedics;  Laterality: Left;   LEFT AND RIGHT HEART CATHETERIZATION WITH CORONARY ANGIOGRAM N/A 02/16/2012   Procedure: LEFT AND RIGHT HEART CATHETERIZATION WITH CORONARY ANGIOGRAM;  Surgeon: Pixie Casino, MD;  Location: [facility] CATH LAB;  Service: Cardiovascular;  Laterality: N/A;   TRANSTHORACIC ECHOCARDIOGRAM  08/31/2012   EF 35-40%, no significant wall motion abnormalities, diastolic relaxation abnormality - no longer  needed LifeVest; LV cavity size mod dilated with mod conc hypertrophy, systolic function mod reduced; mild MR, LA mod dilated   Soc Hx -  Long term live in relationship (14 yr). He has 3 children: oldest is 46, 34, 7. 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 He was seeing Dr. Renato Shin but PCP is now Dr. [name].    reports that he has never smoked. He has never used smokeless tobacco. He reports that he does not drink alcohol or use drugs.  No Known Allergies  Family History  Problem Relation Age of Onset   Coronary artery disease Brother        CABG at 72, DM   Heart disease Brother 26       CAD   Diabetes Mother    Hypertension Mother    Hyperlipidemia Mother    Heart disease Mother    Diabetes Father    Hypertension Father    Unacceptable: Noncontributory, unremarkable, or negative. Acceptable: Family history reviewed and not pertinent (If you reviewed it)  Prior to Admission medications   Medication Sig Start Date End Date Taking? Authorizing Provider  amiodarone (PACERONE) 200 MG tablet Take 1 tablet (200 mg total) by mouth 2 (two) times daily. 11/06/18  Yes [name], [name], MD  apixaban (ELIQUIS) 5 MG TABS tablet Take 1 tablet (5 mg total) by mouth 2 (two) times daily. 11/06/18  Yes [name], [name], MD  atorvastatin (LIPITOR) 10 MG tablet Take 1 tablet (10 mg total) by mouth daily with supper. Patient taking differently: Take 10 mg by mouth every morning.  08/07/18  Yes [name], [name], MD  carvedilol (COREG) 25 MG tablet Take 1 tablet (25 mg total) by mouth 2 (two) times daily with a meal. 08/07/18  Yes [name], [name], MD  Continuous Blood Gluc Receiver (FREESTYLE LIBRE 14 DAY READER) DEVI 1 Device by Does not apply route daily. 11/06/18  Yes [name], [name], MD  Continuous Blood Gluc Sensor (FREESTYLE LIBRE 14 DAY SENSOR) MISC Inject 1 Device into the skin 4 (four) times daily as needed. 11/06/18  Yes [name], [name], MD  Ferrous Sulfate (IRON) 325 (65 Fe) MG TABS  TAKE 1 TABLET BY MOUTH TWICE DAILY WITH MEALS . APPOINTMENT REQUIRED FOR FUTURE REFILLS Patient taking differently: Take 1 tablet by mouth 2 (two) times daily.  04/12/19  Yes [name], [name], MD  gabapentin (NEURONTIN) 300 MG capsule Take 1 capsule (300 mg total) by mouth at bedtime. Patient taking differently: Take 300 mg by mouth at bedtime as needed (pain).  10/25/18  Yes Rayburn, Neta Mends, PA-C  glucose blood (FREESTYLE TEST STRIPS) test strip Use as instructed 09/02/16  Yes Ghimire, Henreitta Leber, MD  glucose monitoring kit (FREESTYLE) monitoring kit 1 each by Does not apply route 4 (four) times daily - after meals and at bedtime. 1 month Diabetic Testing Supplies for QAC-QHS accuchecks. 09/02/16  Yes Ghimire, Henreitta Leber, MD  insulin aspart (NOVOLOG FLEXPEN) 100 UNIT/ML FlexPen Inject  0-15 Units into the skin 3 (three) times daily with meals. 0-15U/Subcu/TIDw/meals//CBG70-120:0U/CBG121-150:2U/CBG151-200:3U/CBG201-250:5U/CBG251-300:8U/CBG301-350:11U/CBG351-400:15U//CBG>400:Call MD 11/14/18  Yes [name], [name], MD  Insulin Glargine (LANTUS) 100 UNIT/ML Solostar Pen Inject 5 Units into the skin at bedtime. Patient taking differently: Inject 1 Units into the skin at bedtime.  11/06/18  Yes [name], [name], MD  Insulin Pen Needle 32G X 8 MM MISC Use as directed 09/02/16  Yes Ghimire, Henreitta Leber, MD  isosorbide-hydrALAZINE (BIDIL) 20-37.5 MG tablet Take 1 tablet by mouth 2 (two) times daily. 09/06/18  Yes Bonnielee Haff, MD  Lancets (FREESTYLE) lancets Use as instructed 09/02/16  Yes Ghimire, Henreitta Leber, MD  Multiple Vitamin (MULTIVITAMIN WITH MINERALS) TABS tablet Take 1 tablet by mouth daily. 09/07/18  Yes Bonnielee Haff, MD  oxyCODONE-acetaminophen (PERCOCET/ROXICET) 5-325 MG tablet Take 1 tablet by mouth every 8 (eight) hours as needed for moderate pain or severe pain. 10/25/18  Yes Rayburn, Neta Mends, PA-C  silver sulfADIAZINE (SILVADENE) 1 % cream Apply topically daily. Silvadene plus dry dressing  to plantar ulcer left foot.  Change daily. Patient taking differently: Apply 1 application topically daily as needed (skin care). Silvadene plus dry dressing to plantar ulcer left foot.  Change daily. 09/07/18  Yes Bonnielee Haff, MD  torsemide (DEMADEX) 100 MG tablet Take 1 tablet (100 mg total) by mouth daily. Patient taking differently: Take 20 mg by mouth 3 (three) times daily.  11/06/18  Yes [name], [name], MD  Vitamin D, Ergocalciferol, (DRISDOL) 1.25 MG (50000 UT) CAPS capsule Take 1 capsule (50,000 Units total) by mouth every 7 (seven) days. 11/06/18  Yes [name], [name], MD  torsemide (DEMADEX) 20 MG tablet TAKE 3 TABLETS BY MOUTH ONCE DAILY . APPOINTMENT REQUIRED FOR FUTURE REFILLS Patient not taking: Reported on 05/21/2019 05/21/19   [name], [name], MD    Physical Exam: Vitals:   05/21/19 1900 05/21/19 1902 05/21/19 2136 05/21/19 2147  BP:  (!) 133/97 (!) 158/103 (!) 158/103  Pulse: 69 67  67  Resp:  18  20  Temp:      TempSrc:      SpO2: 95% 94%  95%  Weight:      Height:        Constitutional: NAD, calm, comfortable Vitals:   05/21/19 1900 05/21/19 1902 05/21/19 2136 05/21/19 2147  BP:  (!) 133/97 (!) 158/103 (!) 158/103  Pulse: 69 67  67  Resp:  18  20  Temp:      TempSrc:      SpO2: 95% 94%  95%  Weight:      Height:       General appearance -  Obese man in no distress Eyes: PERRL, lids and conjunctivae normal ENMT: Mucous membranes are moist. Posterior pharynx clear of any exudate or lesions.Normal dentition.  Neck: normal, supple, no masses, no thyromegaly Respiratory: On auscultation bilaterally, no wheezing, feint crackles bibasilarly. Normal respiratory effort. No accessory muscle use.  Cardiovascular: Regular rate and rhythm, no murmurs / rubs / gallops.5 cm JVD at 30 degrees.  3+ lower  extremity edema to jsut below the knee. trace pedal pulses. No carotid bruits.  Abdomen: obese, very firm with woody skin,no tenderness, no masses palpated. No  hepatosplenomegaly but exam hindered by girth. Bowel sounds positive.  Musculoskeletal: no clubbing / cyanosis. No joint deformity upper and lower extremities. Decreased ROM 2/2 obesity, no contractures. Normal muscle tone.  Skin: Chronic stasis changes to skin of distal LEs. ACE wrap on left foot to cover residual scabbed wound from  plantar diabetic foot ulcer. 93m by 10 mm abrasion dorsal aspect left ankle. Neurologic: CN 2-12 grossly intact. Sensation diminished to light touch plantar aspect feet, . Strength 4/5 in all 4.  Psychiatric: Normal judgment and insight. Alert and oriented x 3. Normal mood.   (Anything < 9 systems with 2 bullets each down codes to level 1) (If patient refuses exam cant bill higher level) (Make sure to document decubitus ulcers present on admission -- if possible -- and whether patient has chronic indwelling catheter at time of admission)  Labs on Admission: I have personally reviewed following labs and imaging studies  CBC: Recent Labs  Lab 05/21/19 1500  WBC 5.0  HGB 11.5*  HCT 37.1*  MCV 96.6  PLT 1161  Basic Metabolic Panel: Recent Labs  Lab 05/21/19 1500  NA 143  K 4.6  CL 106  CO2 23  GLUCOSE 112*  BUN 94*  CREATININE 4.95*  CALCIUM 8.4*   GFR: Estimated Creatinine Clearance: 27.4 mL/min (A) (by C-G formula based on SCr of 4.95 mg/dL (H)). Liver Function Tests: Recent Labs  Lab 05/21/19 1651  AST 20  ALT 16  ALKPHOS 79  BILITOT 2.0*  PROT 7.3  ALBUMIN 2.8*   No results for input(s): LIPASE, AMYLASE in the last 168 hours. No results for input(s): AMMONIA in the last 168 hours. Coagulation Profile: No results for input(s): INR, PROTIME in the last 168 hours. Cardiac Enzymes: No results for input(s): CKTOTAL, CKMB, CKMBINDEX, TROPONINI in the last 168 hours. BNP (last 3 results) No results for input(s): PROBNP in the last 8760 hours. HbA1C: No results for input(s): HGBA1C in the last 72 hours. CBG: No results for input(s):  GLUCAP in the last 168 hours. Lipid Profile: No results for input(s): CHOL, HDL, LDLCALC, TRIG, CHOLHDL, LDLDIRECT in the last 72 hours. Thyroid Function Tests: No results for input(s): TSH, T4TOTAL, FREET4, T3FREE, THYROIDAB in the last 72 hours. Anemia Panel: No results for input(s): VITAMINB12, FOLATE, FERRITIN, TIBC, IRON, RETICCTPCT in the last 72 hours. Urine analysis:    Component Value Date/Time   COLORURINE YELLOW 05/21/2019 1724   APPEARANCEUR CLEAR 05/21/2019 1724   LABSPEC 1.012 05/21/2019 1724   PHURINE 5.0 05/21/2019 1724   GLUCOSEU NEGATIVE 05/21/2019 1724   HGBUR MODERATE (A) 05/21/2019 1724   BILIRUBINUR NEGATIVE 05/21/2019 1724   KETONESUR NEGATIVE 05/21/2019 1724   PROTEINUR 100 (A) 05/21/2019 1724   UROBILINOGEN 0.2 09/02/2009 0935   NITRITE NEGATIVE 05/21/2019 1724   LEUKOCYTESUR NEGATIVE 05/21/2019 1724    Radiological Exams on Admission: Dg Chest 2 View  Result Date: 05/21/2019 CLINICAL DATA:  43year old male with history of increasing edema and weight gain. History of congestive heart failure. EXAM: CHEST - 2 VIEW COMPARISON:  Chest x-ray 08/29/2018. FINDINGS: Lung volumes are low. No consolidative airspace disease. No pleural effusions. Cephalization of the pulmonary vasculature with mild indistinctness of interstitial markings and moderate to severe cardiomegaly. Upper mediastinal contours are within normal limits. IMPRESSION: 1. The appearance the chest suggests mild congestive heart failure. Electronically Signed   By: DVinnie LangtonM.D.   On: 05/21/2019 18:09    EKG: Independently reviewed. SR, RBBB, LAFB, old anterior infarct, no ischemic changes. Similar to previous  Assessment/Plan Active Problems:   Hypertension   Obesity   Acute on chronic systolic CHF (congestive heart failure) (HCC)   Type 2 diabetes mellitus with vascular disease (HCC)   CKD (chronic kidney disease), stage IV (HCC)   [place]   (  please populate well all problems here in Problem List. (For example, if patient is on BP meds at home and you resume or decide to hold them, it is a problem that needs to be her. Same for CAD, COPD, HLD and so on)   1. Acute on chronic HF - Mild left sided symptoms - few feint crackles on pulmonary exam, mild failure radiographically. More significant right sided findings with peripheral edema. Plan Tele admit  IV furosemide at 80 mg TID, approximating most recent torsemide dose  Continue home cardiac meds  Heart Failure team consult requested  2. DM - patient reports good control. Last A1C Dec '19 was 8.5%. He does use 14 days glucose monitoring system. His lantus dose is very low but he claims this is due to stable CBGs in the 114 range.  Plan Continue home basal insulin dose  A1C ordered  Ss coverage ` Adamantly encouraged to have PCP schedule opthal exam  3. CKD IV --worsening Cr over past several months. Not on ACE/ARB 2/2 progressive renal failure Plan Patient instructed to have PCP reschedule nephrology consult  4. HTN - continue present meds.  5. A. Fib - by history. On exam regular rate. He is on Eliquis  Plan Continue home regimen.  DVT prophylaxis: eliquis (Lovenox/Heparin/SCD's/anticoagulated/None (if comfort care) Code Status: full code (Full/Partial (specify details) Family Communication: at patient's request I called and spoke with his mother - gave full update (Specify name, relationship. Do not write "discussed with patient". Specify tel # if discussed over the phone) Disposition Plan: home 2-3 days (specify when and where you expect patient to be discharged) Consults called: Heart failure team consult put in (with names) Admission status: inpatient tele (inpatient / obs / tele / medical floor / SDU)   Adella Hare MD [facility] Pager [phone]  If 7PM-7AM, please contact night-coverage [email or web address] Password [email or web address]  05/21/2019, 11:35 PM

## 2019-05-21 NOTE — ED Notes (Signed)
Patient transported to X-ray 

## 2019-05-21 NOTE — ED Provider Notes (Addendum)
[facility] EMERGENCY DEPARTMENT Provider Note   CSN: [id number] Arrival date & time: 05/21/19  1412     History   Chief Complaint Chief Complaint  Patient presents with  . Fluid Retention    HPI Joshua Massey is a 43 y.o. male T2DM, HTN, CKD stage IV , left diabetic foot wound with osteomyelitis s/p debridement in January (followed by Dr.Duda) , paroxysmal A. fib on Eliquis,  and combined systolic and diastolic CHF who presents after not taking his diuretic for 2 days.  Patient reports he ran out of his torsemide 2 days ago and was recently prescribe a higher dose he has not picked up.  Last for heart failure in March of this year.  His echo in March was 20 to 25%.  Reports he ran out of batteries for his scale at home and not sure how much weight gain he has had recently. He denies any recent illness, infectious symptoms, shortness of breath or chest pain.    Past Medical History:  Diagnosis Date  . 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 Chronic kidney disease   . Diabetes mellitus    adult-onset, type 2  . Exogenous obesity   . GERD (gastroesophageal reflux disease)   . Hypertension   . Left foot infection 06/21/2018  . Osteomyelitis of foot, left, acute ([place]) 07/04/2018    Patient Active Problem List   Diagnosis Date Noted  . Anemia, iron deficiency 11/06/2018  . Heart failure ([place]) 08/31/2018  . Acute on chronic combined systolic and diastolic CHF (congestive heart failure) ([place]) 08/29/2018  . S/P split thickness skin graft 08/02/2018  . Subacute osteomyelitis, left ankle and foot ([place])   . Non-compliant patient 12/18/2017  . 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 Hypovitaminosis D 11/27/2017  . Diabetic foot ulcer ([place]) 11/27/2017  . Cellulitis in diabetic foot ([place]) 11/27/2017  . Acute heart failure ([place]) 11/27/2017  . Volume overload 11/27/2017  .  Hepatopathy 11/27/2017  . Hypokalemia 05/31/2017  . Diabetic peripheral neuropathy ([place]) 12/20/2016  . Hyperlipidemia associated with type 2 diabetes mellitus ([place]) 11/27/2016  . Shingles 10/11/2016  . Status post peripherally inserted central catheter (PICC) central line placement 09/12/2016  . Medication monitoring encounter 09/12/2016  . Arthritis, septic ([place])   . CKD (chronic kidney disease), stage IV ([place])   . AKI (acute kidney injury) ([place])   . Peripheral edema   . Acute renal failure ([place]) 08/17/2016  . Acute systolic CHF (congestive heart failure) ([place]) 08/17/2016  . Type 2 diabetes mellitus with vascular disease ([place]) 10/21/2013  . Chronic systolic heart failure ([place]) 02/27/2012  . Coronary artery disease, 50% LAD at cath 02/16/12 02/23/2012  . Family history of coronary artery disease 02/23/2012  . Cardiogenic shock ([place]) 02/17/2012  . NSVT (nonsustained ventricular tachycardia) ([place]) 02/16/2012  . Obesity 02/16/2012  . Acute on chronic systolic CHF (congestive heart failure) ([place]) 02/16/2012  . CHF (congestive heart failure) ([place]) 02/15/2012  . Hypertension 02/15/2012  . Non-ischemic cardiomyopathy, EF < 20% 02/15/2012  . TINEA CRURIS 09/25/2009  . ALLERGIC RHINITIS CAUSE UNSPECIFIED 09/25/2009  . SCROTAL ABSCESS 09/17/2009  . Hypertensive heart disease with heart failure ([place]) 01/26/2009    Past Surgical History:  Procedure Laterality Date  . ANKLE SURGERY  2003   plate and screws  . APPLICATION OF WOUND VAC Left 06/12/2018   Procedure: APPLICATION OF WOUND VAC;  Surgeon: Ninfa Linden,  Lind Guest, MD;  Location: [facility];  Service: Orthopedics;  Laterality: Left;  . CARDIAC CATHETERIZATION  02/16/2012   50% LAD lesion in mid vessel, otherwise no significant obstructive CAD  . HIP PINNING  1990   bilateral  . I&D EXTREMITY Left 06/10/2018   Procedure: IRRIGATION AND DEBRIDEMENT OF FOOT;  Surgeon: Mcarthur Rossetti, MD;  Location: [facility];  Service: Orthopedics;   Laterality: Left;  . I&D EXTREMITY Left 06/12/2018   Procedure: REPEAT IRRIGATION AND DEBRIDEMENT LEFT FOOT;  Surgeon: Mcarthur Rossetti, MD;  Location: [facility];  Service: Orthopedics;  Laterality: Left;  . I&D EXTREMITY Left 07/04/2018   Procedure: DEBRIDEMENT OF LEFT FOOT;  Surgeon: Newt Minion, MD;  Location: [facility];  Service: Orthopedics;  Laterality: Left;  . IR GENERIC HISTORICAL  09/01/2016   IR FLUORO GUIDE CV LINE RIGHT 09/01/2016 Marybelle Killings, MD [facility]  . IR GENERIC HISTORICAL  09/01/2016   IR US GUIDE VASC ACCESS RIGHT 09/01/2016 Marybelle Killings, MD [facility]  . IR GENERIC HISTORICAL  09/16/2016   IR REMOVAL TUN CV CATH W/O FL 09/16/2016 Arne Cleveland, MD [facility]  . KNEE ARTHROSCOPY Left 08/30/2016   Procedure: ARTHROSCOPY KNEE;  Surgeon: Renette Butters, MD;  Location: [facility];  Service: Orthopedics;  Laterality: Left;  . LEFT AND RIGHT HEART CATHETERIZATION WITH CORONARY ANGIOGRAM N/A 02/16/2012   Procedure: LEFT AND RIGHT HEART CATHETERIZATION WITH CORONARY ANGIOGRAM;  Surgeon: Pixie Casino, MD;  Location: [facility] CATH LAB;  Service: Cardiovascular;  Laterality: N/A;  . TRANSTHORACIC ECHOCARDIOGRAM  08/31/2012   EF 35-40%, no significant wall motion abnormalities, diastolic relaxation abnormality - no longer needed LifeVest; LV cavity size mod dilated with mod conc hypertrophy, systolic function mod reduced; mild MR, LA mod dilated        Home Medications    Prior to Admission medications   Medication Sig Start Date End Date Taking? Authorizing Provider  amiodarone (PACERONE) 200 MG tablet Take 1 tablet (200 mg total) by mouth 2 (two) times daily. 11/06/18  Yes [name], [name], MD  apixaban (ELIQUIS) 5 MG TABS tablet Take 1 tablet (5 mg total) by mouth 2 (two) times daily. 11/06/18  Yes [name], [name], MD  atorvastatin (LIPITOR) 10 MG tablet Take 1 tablet (10 mg total) by mouth daily with supper. Patient taking differently: Take 10 mg by mouth every morning.   08/07/18  Yes [name], [name], MD  carvedilol (COREG) 25 MG tablet Take 1 tablet (25 mg total) by mouth 2 (two) times daily with a meal. 08/07/18  Yes [name], [name], MD  Continuous Blood Gluc Receiver (FREESTYLE LIBRE 14 DAY READER) DEVI 1 Device by Does not apply route daily. 11/06/18  Yes [name], [name], MD  Continuous Blood Gluc Sensor (FREESTYLE LIBRE 14 DAY SENSOR) MISC Inject 1 Device into the skin 4 (four) times daily as needed. 11/06/18  Yes [name], [name], MD  Ferrous Sulfate (IRON) 325 (65 Fe) MG TABS TAKE 1 TABLET BY MOUTH TWICE DAILY WITH MEALS . APPOINTMENT REQUIRED FOR FUTURE REFILLS Patient taking differently: Take 1 tablet by mouth 2 (two) times daily.  04/12/19  Yes [name], [name], MD  gabapentin (NEURONTIN) 300 MG capsule Take 1 capsule (300 mg total) by mouth at bedtime. Patient taking differently: Take 300 mg by mouth at bedtime as needed (pain).  10/25/18  Yes Rayburn, Neta Mends, PA-C  glucose blood (FREESTYLE TEST STRIPS) test strip Use as instructed 09/02/16  Yes Ghimire, [employer],  MD  glucose monitoring kit (FREESTYLE) monitoring kit 1 each by Does not apply route 4 (four) times daily - after meals and at bedtime. 1 month Diabetic Testing Supplies for QAC-QHS accuchecks. 09/02/16  Yes Ghimire, Henreitta Leber, MD  insulin aspart (NOVOLOG FLEXPEN) 100 UNIT/ML FlexPen Inject 0-15 Units into the skin 3 (three) times daily with meals. 0-15U/Subcu/TIDw/meals//CBG70-120:0U/CBG121-150:2U/CBG151-200:3U/CBG201-250:5U/CBG251-300:8U/CBG301-350:11U/CBG351-400:15U//CBG>400:Call MD 11/14/18  Yes [name], [name], MD  Insulin Glargine (LANTUS) 100 UNIT/ML Solostar Pen Inject 5 Units into the skin at bedtime. Patient taking differently: Inject 1 Units into the skin at bedtime.  11/06/18  Yes [name], [name], MD  Insulin Pen Needle 32G X 8 MM MISC Use as directed 09/02/16  Yes Ghimire, Henreitta Leber, MD  isosorbide-hydrALAZINE (BIDIL) 20-37.5 MG tablet Take 1 tablet by mouth 2 (two) times daily. 09/06/18   Yes Bonnielee Haff, MD  Lancets (FREESTYLE) lancets Use as instructed 09/02/16  Yes Ghimire, Henreitta Leber, MD  Multiple Vitamin (MULTIVITAMIN WITH MINERALS) TABS tablet Take 1 tablet by mouth daily. 09/07/18  Yes Bonnielee Haff, MD  oxyCODONE-acetaminophen (PERCOCET/ROXICET) 5-325 MG tablet Take 1 tablet by mouth every 8 (eight) hours as needed for moderate pain or severe pain. 10/25/18  Yes [place], Neta Mends, PA-C  silver sulfADIAZINE (SILVADENE) 1 % cream Apply topically daily. Silvadene plus dry dressing to plantar ulcer left foot.  Change daily. Patient taking differently: Apply 1 application topically daily as needed (skin care). Silvadene plus dry dressing to plantar ulcer left foot.  Change daily. 09/07/18  Yes Bonnielee Haff, MD  torsemide (DEMADEX) 100 MG tablet Take 1 tablet (100 mg total) by mouth daily. Patient taking differently: Take 20 mg by mouth 3 (three) times daily.  11/06/18  Yes [name], [name], MD  Vitamin D, Ergocalciferol, (DRISDOL) 1.25 MG (50000 UT) CAPS capsule Take 1 capsule (50,000 Units total) by mouth every 7 (seven) days. 11/06/18  Yes [name], [name], MD  torsemide (DEMADEX) 20 MG tablet TAKE 3 TABLETS BY MOUTH ONCE DAILY . APPOINTMENT REQUIRED FOR FUTURE REFILLS Patient not taking: Reported on 05/21/2019 05/21/19   [name], [name], MD    Family History Family History  Problem Relation Age of Onset  . Coronary artery disease Brother        CABG at 101, DM  . Heart disease Brother 58       CAD  . Diabetes Mother   . Hypertension Mother   . Hyperlipidemia Mother   . Heart disease Mother   . Diabetes Father   . Hypertension Father     Social History Social History   Tobacco Use  . Smoking status: Never Smoker  . Smokeless tobacco: Never Used  Substance Use Topics  . Alcohol use: No  . Drug use: No     Allergies   Patient has no known allergies.   Review of Systems Review of Systems  Constitutional: Negative for chills and fever.  HENT:  Positive for sneezing. Negative for sore throat.   Eyes: Positive for redness and itching.  Respiratory: Negative for chest tightness and shortness of breath.   Gastrointestinal: Positive for abdominal distention and constipation. Negative for abdominal pain.  Endocrine: Negative.   Genitourinary: Negative for difficulty urinating and dysuria.  Allergic/Immunologic: Positive for environmental allergies.  Psychiatric/Behavioral: Negative.   All other systems reviewed and are negative.    Physical Exam Updated Vital Signs BP (!) 133/97 (BP Location: Right Arm)   Pulse 67   Temp 98.1 F (36.7 C) (Oral)   Resp 18  Ht _0  (1.803 m)   Wt 136.1 kg   SpO2 94%   BMI 41.84 kg/m   Gen: Obese, NAD HEENT: sclera white, trachea midline, moist mucous membranes, no exudate CV: RRR, No murmurs, rubs, or gallops, JVD ~9cm Pulm: CTAB, no wheezes , rhonchi, or rales Abd: distended, normal bowel sounds, non tender Ext: Bilateral lower extremity edema to the thigh, wound on  L dorsal ankle fold  Neuro: AOx4, no focal defecits Psych: Mood normal,Affect normal   ED Treatments / Results  Labs (all labs ordered are listed, but only abnormal results are displayed) Labs Reviewed  BASIC METABOLIC PANEL - Abnormal; Notable for the following components:      Result Value   Glucose, Bld 112 (*)    BUN 94 (*)    Creatinine, Ser 4.95 (*)    Calcium 8.4 (*)    GFR calc non Af Amer 13 (*)    GFR calc Af Amer 15 (*)    All other components within normal limits  CBC - Abnormal; Notable for the following components:   RBC 3.84 (*)    Hemoglobin 11.5 (*)    HCT 37.1 (*)    RDW 17.4 (*)    All other components within normal limits  HEPATIC FUNCTION PANEL - Abnormal; Notable for the following components:   Albumin 2.8 (*)    Total Bilirubin 2.0 (*)    Bilirubin, Direct 0.8 (*)    Indirect Bilirubin 1.2 (*)    All other components within normal limits  BRAIN NATRIURETIC PEPTIDE - Abnormal;  Notable for the following components:   B Natriuretic Peptide 1,296.3 (*)    All other components within normal limits  URINALYSIS, ROUTINE W REFLEX MICROSCOPIC - Abnormal; Notable for the following components:   Hgb urine dipstick MODERATE (*)    Protein, ur 100 (*)    All other components within normal limits  POC SARS CORONAVIRUS 2 AG -  ED    EKG EKG Interpretation  Date/Time:  Tuesday May 21 2019 16:21:56 EST Ventricular Rate:  79 PR Interval:    QRS Duration: 130 QT Interval:  443 QTC Calculation: 508 R Axis:   172 Text Interpretation: Age not entered, assumed to be  43 years old for purpose of ECG interpretation Sinus rhythm Borderline prolonged PR interval RBBB and LPFB Anterior infarct, old no ischemic changes Confirmed by Theotis Burrow [phone]) on 05/21/2019 4:26:21 PM   Radiology Dg Chest 2 View  Result Date: 05/21/2019 CLINICAL DATA:  43 year old male with history of increasing edema and weight gain. History of congestive heart failure. EXAM: CHEST - 2 VIEW COMPARISON:  Chest x-ray 08/29/2018. FINDINGS: Lung volumes are low. No consolidative airspace disease. No pleural effusions. Cephalization of the pulmonary vasculature with mild indistinctness of interstitial markings and moderate to severe cardiomegaly. Upper mediastinal contours are within normal limits. IMPRESSION: 1. The appearance the chest suggests mild congestive heart failure. Electronically Signed   By: Vinnie Langton M.D.   On: 05/21/2019 18:09    Procedures Procedures (including critical care time)  Medications Ordered in ED Medications  furosemide (LASIX) 100 mg in dextrose 5 % 50 mL IVPB (0 mg Intravenous Stopped 05/21/19 1955)     Initial Impression / Assessment and Plan / ED Course  I have reviewed the triage vital signs and the nursing notes.  Pertinent labs & imaging results that were available during my care of the patient were reviewed by me and considered in my medical decision making  (see  chart for details).  Clinical Course as of May 20 2046  Tue May 21, 2019  1725 Creatinine(!): 4.95 [JS]    Clinical Course User Index [JS] Madalyn Rob, MD      Joshua Massey is a 43 y.o. male T2DM, HTN, CKD stage IV , left diabetic foot wound with osteomyelitis s/p debridement in January (followed by Dr.Duda) , paroxysmal A. fib on Eliquis,  and combined systolic and diastolic CHF who presents after not taking his diuretic for 2 days.  Hypervolemic, with right sided heart failure signs on exam .Vital signs are stable.  Reviewed labs and imaging. No effusions or consolidation on xray, severe cardiomegaly w/ cephalization of pulmonary vasculature. BNP elevated to approximately 1300. CKD stage IV is worse on labs. No ischemic changes on ECG.   Recommend admission for acute on chronic combined systolic and diastolic heart failure.   Dr.Little spoke to hospitalitis, Dr. Linda Hedges, who has agreed to admitting patient.   Final Clinical Impressions(s) / ED Diagnoses   Final diagnoses:  Acute on chronic congestive heart failure, unspecified heart failure type ([place])  Acute renal failure superimposed on chronic kidney disease, unspecified CKD stage, unspecified acute renal failure type [facility])    ED Discharge Orders    None       Madalyn Rob, MD 05/21/19 2246    Madalyn Rob, MD 05/23/19 2105    Rex Kras, Wenda Overland, MD 05/24/19 1511

## 2019-05-22 ENCOUNTER — Encounter (HOSPITAL_COMMUNITY): Payer: Self-pay | Admitting: General Practice

## 2019-05-22 ENCOUNTER — Telehealth: Payer: BC Managed Care – PPO | Admitting: Family Medicine

## 2019-05-22 ENCOUNTER — Inpatient Hospital Stay (HOSPITAL_COMMUNITY): Payer: BC Managed Care – PPO

## 2019-05-22 DIAGNOSIS — N179 Acute kidney failure, unspecified: Secondary | ICD-10-CM | POA: Diagnosis not present

## 2019-05-22 DIAGNOSIS — I5023 Acute on chronic systolic (congestive) heart failure: Secondary | ICD-10-CM

## 2019-05-22 LAB — HIV ANTIBODY (ROUTINE TESTING W REFLEX): HIV Screen 4th Generation wRfx: NONREACTIVE

## 2019-05-22 LAB — GLUCOSE, CAPILLARY
Glucose-Capillary: 112 mg/dL — ABNORMAL HIGH (ref 70–99)
Glucose-Capillary: 107 mg/dL — ABNORMAL HIGH (ref 70–99)

## 2019-05-22 LAB — BASIC METABOLIC PANEL
Anion gap: 12 (ref 5–15)
BUN: 95 mg/dL — ABNORMAL HIGH (ref 6–20)
CO2: 21 mmol/L — ABNORMAL LOW (ref 22–32)
Calcium: 8.3 mg/dL — ABNORMAL LOW (ref 8.9–10.3)
Chloride: 108 mmol/L (ref 98–111)
Creatinine, Ser: 4.79 mg/dL — ABNORMAL HIGH (ref 0.61–1.24)
GFR calc Af Amer: 16 mL/min — ABNORMAL LOW (ref >60)
GFR calc non Af Amer: 14 mL/min — ABNORMAL LOW (ref >60)
Glucose, Bld: 91 mg/dL (ref 70–99)
Potassium: 4.5 mmol/L (ref 3.5–5.1)
Sodium: 141 mmol/L (ref 135–145)

## 2019-05-22 LAB — CBG MONITORING, ED: Glucose-Capillary: 84 mg/dL (ref 70–99)

## 2019-05-22 MED ORDER — FUROSEMIDE 10 MG/ML IJ SOLN
60.0000 mg | Freq: Once | INTRAMUSCULAR | Status: AC
  Administered 2019-05-22: 60 mg via INTRAVENOUS
  Filled 2019-05-22: qty 6

## 2019-05-22 MED ORDER — FUROSEMIDE 10 MG/ML IJ SOLN
12.0000 mg/h | INTRAVENOUS | Status: DC
  Administered 2019-05-22 – 2019-06-03 (×13): 12 mg/h via INTRAVENOUS
  Filled 2019-05-22 (×6): qty 25
  Filled 2019-05-22: qty 21
  Filled 2019-05-22 (×6): qty 25
  Filled 2019-05-22: qty 20
  Filled 2019-05-22 (×3): qty 25

## 2019-05-22 MED ORDER — INFLUENZA VAC SPLIT QUAD 0.5 ML IM SUSY
0.5000 mL | PREFILLED_SYRINGE | INTRAMUSCULAR | Status: AC
  Administered 2019-06-04: 0.5 mL via INTRAMUSCULAR
  Filled 2019-05-22 (×2): qty 0.5

## 2019-05-22 MED ORDER — AMIODARONE HCL 200 MG PO TABS
200.0000 mg | ORAL_TABLET | Freq: Every day | ORAL | Status: DC
  Administered 2019-05-23 – 2019-06-04 (×13): 200 mg via ORAL
  Filled 2019-05-22 (×13): qty 1

## 2019-05-22 NOTE — ED Notes (Signed)
ED TO INPATIENT HANDOFF REPORT  ED Nurse Name and Phone #:   S Name/Age/Gender Joshua Massey 43 y.o. male Room/Bed: 013C/013C  Code Status   Code Status: Full Code  Home/SNF/Other Home Patient oriented to: self, place, time and situation Is this baseline? Yes   Triage Complete: Triage complete  Chief Complaint Fluid retention  Triage Note Per [facility] pt coming from home for fluid retention to legs and abdomen. Patients diuretic dosage was increased so patient was taking more medication and ended up running out of his medicine 2 days ago. Patient was told by heart failure clinic to come in.    Allergies No Known Allergies  Level of Care/Admitting Diagnosis ED Disposition    ED Disposition Condition Comment   Admit  Hospital Area: [facility] [[id number]]  Level of Care: Telemetry Cardiac [[id number]]  Covid Evaluation: Asymptomatic Screening Protocol (No Symptoms)  Diagnosis: Acute on chronic systolic heart failure (HCC) [428.23.ICD-9-CM]  Admitting Physician: NORINS, MICHAEL E [[id number]]  Attending Physician: NORINS, MICHAEL E [[id number]]  Estimated length of stay: past midnight tomorrow  Certification:: I certify this patient will need inpatient services for at least 2 midnights  PT Class (Do Not Modify): Inpatient [101]  PT Acc Code (Do Not Modify): Private [1]       B Medical/Surgery History Past Medical History:  Diagnosis Date  . 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 Chronic kidney disease   . Diabetes mellitus    adult-onset, type 2  . Exogenous obesity   . GERD (gastroesophageal reflux disease)   . Hypertension   . Left foot infection 06/21/2018  . Osteomyelitis of foot, left, acute (HCC) 07/04/2018   Past Surgical History:  Procedure Laterality Date  . ANKLE SURGERY  2003   plate and screws  . APPLICATION OF WOUND VAC Left 06/12/2018   Procedure: APPLICATION OF WOUND VAC;  Surgeon: Blackman, Christopher Y, MD;   Location: [facility] OR;  Service: Orthopedics;  Laterality: Left;  . CARDIAC CATHETERIZATION  02/16/2012   50% LAD lesion in mid vessel, otherwise no significant obstructive CAD  . HIP PINNING  1990   bilateral  . I&D EXTREMITY Left 06/10/2018   Procedure: IRRIGATION AND DEBRIDEMENT OF FOOT;  Surgeon: Blackman, Christopher Y, MD;  Location: [facility] OR;  Service: Orthopedics;  Laterality: Left;  . I&D EXTREMITY Left 06/12/2018   Procedure: REPEAT IRRIGATION AND DEBRIDEMENT LEFT FOOT;  Surgeon: Blackman, Christopher Y, MD;  Location: [facility] OR;  Service: Orthopedics;  Laterality: Left;  . I&D EXTREMITY Left 07/04/2018   Procedure: DEBRIDEMENT OF LEFT FOOT;  Surgeon: Duda, Marcus V, MD;  Location: [facility] OR;  Service: Orthopedics;  Laterality: Left;  . IR GENERIC HISTORICAL  09/01/2016   IR FLUORO GUIDE CV LINE RIGHT 09/01/2016 Arthur Hoss, MD [facility]  . IR GENERIC HISTORICAL  09/01/2016   IR US GUIDE VASC ACCESS RIGHT 09/01/2016 Arthur Hoss, MD [facility]  . IR GENERIC HISTORICAL  09/16/2016   IR REMOVAL TUN CV CATH W/O FL 09/16/2016 Daniel Hassell, MD [facility]  . KNEE ARTHROSCOPY Left 08/30/2016   Procedure: ARTHROSCOPY KNEE;  Surgeon: Murphy, Timothy D, MD;  Location: [facility] OR;  Service: Orthopedics;  Laterality: Left;  . LEFT AND RIGHT HEART CATHETERIZATION WITH CORONARY ANGIOGRAM N/A 02/16/2012   Procedure: LEFT AND RIGHT HEART CATHETERIZATION WITH CORONARY ANGIOGRAM;  Surgeon: Kenneth C. Hilty, MD;  Location: [facility] CATH LAB;  Service: Cardiovascular;  Laterality: N/A;  . TRANSTHORACIC ECHOCARDIOGRAM  08/31/2012     EF 35-40%, no significant wall motion abnormalities, diastolic relaxation abnormality - no longer needed LifeVest; LV cavity size mod dilated with mod conc hypertrophy, systolic function mod reduced; mild MR, LA mod dilated     A IV Location/Drains/Wounds Patient Lines/Drains/Airways Status   Active Line/Drains/Airways    Name:   Placement date:   Placement time:   Site:   Days:   Peripheral IV  05/21/19 Left Antecubital   05/21/19    1631    Antecubital   1   Negative Pressure Wound Therapy Foot Left   07/04/18    1503    -   322   Incision (Closed) 06/10/18 Toe (Comment  which one) Right   06/10/18    1520     346   Incision (Closed) 06/12/18 Leg Left   06/12/18    0957     344   Incision (Closed) 07/04/18 Foot Left;Other (Comment)   07/04/18    1504     322   Wound / Incision (Open or Dehisced) 06/09/18 Diabetic ulcer Toe (Comment  which one) Right   06/09/18    1745    Toe (Comment  which one)   347   Wound / Incision (Open or Dehisced) 08/29/18 Diabetic ulcer Foot Left;Posterior   08/29/18    2345    Foot   266   Wound / Incision (Open or Dehisced) 08/29/18 Diabetic ulcer Toe (Comment  which one) Right;Other (Comment)   08/29/18    2345    Toe (Comment  which one)   266   Wound / Incision (Open or Dehisced) 08/29/18 Venous stasis ulcer Leg Right   08/29/18    2345    Leg   266          Intake/Output Last 24 hours  Intake/Output Summary (Last 24 hours) at 05/22/2019 1333 Last data filed at 05/22/2019 0735 Gross per 24 hour  Intake -  Output 1640 ml  Net -1640 ml    Labs/Imaging Results for orders placed or performed during the hospital encounter of 05/21/19 (from the past 48 hour(s))  Basic metabolic panel     Status: Abnormal   Collection Time: 05/21/19  3:00 PM  Result Value Ref Range   Sodium 143 135 - 145 mmol/L   Potassium 4.6 3.5 - 5.1 mmol/L   Chloride 106 98 - 111 mmol/L   CO2 23 22 - 32 mmol/L   Glucose, Bld 112 (H) 70 - 99 mg/dL   BUN 94 (H) 6 - 20 mg/dL   Creatinine, Ser 4.95 (H) 0.61 - 1.24 mg/dL   Calcium 8.4 (L) 8.9 - 10.3 mg/dL   GFR calc non Af Amer 13 (L) >60 mL/min   GFR calc Af Amer 15 (L) >60 mL/min   Anion gap 14 5 - 15    Comment: Performed at [facility], [address], [place], [place] [address]  CBC     Status: Abnormal   Collection Time: 05/21/19  3:00 PM  Result Value Ref Range   WBC 5.0 4.0 - 10.5 K/uL   RBC 3.84 (L) 4.22 -  5.81 MIL/uL   Hemoglobin 11.5 (L) 13.0 - 17.0 g/dL   HCT 37.1 (L) 39.0 - 52.0 %   MCV 96.6 80.0 - 100.0 fL   MCH 29.9 26.0 - 34.0 pg   MCHC 31.0 30.0 - 36.0 g/dL   RDW 17.4 (H) 11.5 - 15.5 %   Platelets 181 150 - 400 K/uL  nRBC 0.0 0.0 - 0.2 %    Comment: Performed at [facility], [address] [place], [place] [address]  Hepatic function panel     Status: Abnormal   Collection Time: 05/21/19  4:51 PM  Result Value Ref Range   Total Protein 7.3 6.5 - 8.1 g/dL   Albumin 2.8 (L) 3.5 - 5.0 g/dL   AST 20 15 - 41 U/L   ALT 16 0 - 44 U/L   Alkaline Phosphatase 79 38 - 126 U/L   Total Bilirubin 2.0 (H) 0.3 - 1.2 mg/dL   Bilirubin, Direct 0.8 (H) 0.0 - 0.2 mg/dL   Indirect Bilirubin 1.2 (H) 0.3 - 0.9 mg/dL    Comment: Performed at [facility], [address] [place], [place] [address]  Brain natriuretic peptide     Status: Abnormal   Collection Time: 05/21/19  4:51 PM  Result Value Ref Range   B Natriuretic Peptide 1,296.3 (H) 0.0 - 100.0 pg/mL    Comment: Performed at [facility], [address] [place], [place] [address]  Urinalysis, Routine w reflex microscopic     Status: Abnormal   Collection Time: 05/21/19  5:24 PM  Result Value Ref Range   Color, Urine YELLOW YELLOW   APPearance CLEAR CLEAR   Specific Gravity, Urine 1.012 1.005 - 1.030   pH 5.0 5.0 - 8.0   Glucose, UA NEGATIVE NEGATIVE mg/dL   Hgb urine dipstick MODERATE (A) NEGATIVE   Bilirubin Urine NEGATIVE NEGATIVE   Ketones, ur NEGATIVE NEGATIVE mg/dL   Protein, ur 100 (A) NEGATIVE mg/dL   Nitrite NEGATIVE NEGATIVE   Leukocytes,Ua NEGATIVE NEGATIVE   RBC / HPF 21-50 0 - 5 RBC/hpf   WBC, UA 0-5 0 - 5 WBC/hpf   Bacteria, UA NONE SEEN NONE SEEN   Squamous Epithelial / LPF 0-5 0 - 5   Mucus PRESENT    Hyaline Casts, UA PRESENT     Comment: Performed at [facility], [address] , [place] [address]  POC SARS Coronavirus 2 Ag-ED - Nasal Swab (BD Veritor Kit)     Status: None    Collection Time: 05/21/19  8:13 PM  Result Value Ref Range   SARS Coronavirus 2 Ag NEGATIVE NEGATIVE    Comment: (NOTE) SARS-CoV-2 antigen NOT DETECTED.  Negative results are presumptive.  Negative results do not preclude SARS-CoV-2 infection and should not be used as the sole basis for treatment or other patient management decisions, including infection  control decisions, particularly in the presence of clinical signs and  symptoms consistent with COVID-19, or in those who have been in contact with the virus.  Negative results must be combined with clinical observations, patient history, and epidemiological information. The expected result is Negative. Fact Sheet for Patients: https://www.fda.gov/media/139754/download Fact Sheet for Healthcare Providers: https://www.fda.gov/media/139753/download This test is not yet approved or cleared by the [place] FDA and  has been authorized for detection and/or diagnosis of SARS-CoV-2 by FDA under an Emergency Use Authorization (EUA).  This EUA will remain in effect (meaning this test can be used) for the duration of  the COVID-19 de claration under Section 564(b)(1) of the Act, 21 U.S.C. section 360bbb-3(b)(1), unless the authorization is terminated or revoked sooner.   HIV Antibody (routine testing w rflx)     Status: None   Collection Time: 05/21/19 11:11 PM  Result Value Ref Range   HIV Screen 4th Generation wRfx NON REACTIVE NON REACTIVE      Comment: Performed at [facility], [address] [place], [place] [address]  Basic metabolic panel     Status: Abnormal   Collection Time: 05/22/19  3:01 AM  Result Value Ref Range   Sodium 141 135 - 145 mmol/L   Potassium 4.5 3.5 - 5.1 mmol/L   Chloride 108 98 - 111 mmol/L   CO2 21 (L) 22 - 32 mmol/L   Glucose, Bld 91 70 - 99 mg/dL   BUN 95 (H) 6 - 20 mg/dL   Creatinine, Ser 4.79 (H) 0.61 - 1.24 mg/dL   Calcium 8.3 (L) 8.9 - 10.3 mg/dL   GFR calc non Af Amer 14 (L) >60 mL/min    GFR calc Af Amer 16 (L) >60 mL/min   Anion gap 12 5 - 15    Comment: Performed at [facility], [address] [place], [place] [address]  CBG monitoring, ED     Status: None   Collection Time: 05/22/19  7:53 AM  Result Value Ref Range   Glucose-Capillary 84 70 - 99 mg/dL   Comment 1 Notify RN    Comment 2 Document in Chart    Dg Chest 2 View  Result Date: 05/22/2019 CLINICAL DATA:  Congestive heart failure. EXAM: CHEST - 2 VIEW COMPARISON:  05/21/2019 FINDINGS: Very low lung volumes limit evaluation. Lordotic positioning also noted. Cardiomegaly remains stable. Both lungs are grossly clear. IMPRESSION: Very low lung volumes limit evaluation. Stable cardiomegaly. No active lung disease. Electronically Signed   By: John A Stahl M.D.   On: 05/22/2019 08:55   Dg Chest 2 View  Result Date: 05/21/2019 CLINICAL DATA:  43-year-old male with history of increasing edema and weight gain. History of congestive heart failure. EXAM: CHEST - 2 VIEW COMPARISON:  Chest x-ray 08/29/2018. FINDINGS: Lung volumes are low. No consolidative airspace disease. No pleural effusions. Cephalization of the pulmonary vasculature with mild indistinctness of interstitial markings and moderate to severe cardiomegaly. Upper mediastinal contours are within normal limits. IMPRESSION: 1. The appearance the chest suggests mild congestive heart failure. Electronically Signed   By: Daniel  Entrikin M.D.   On: 05/21/2019 18:09    Pending Labs Unresulted Labs (From admission, onward)    Start     Ordered   05/28/19 0500  Creatinine, serum  (enoxaparin (LOVENOX)    CrCl < 30 ml/min)  Weekly,   R    Comments: while on enoxaparin therapy.    05/21/19 2304   05/23/19 0500  Brain natriuretic peptide  Daily,   R     05/22/19 0723   05/22/19 0500  Basic metabolic panel  Daily,   R     05/21/19 2304   05/21/19 2304  Hemoglobin A1c  Once,   STAT    Comments: To assess prior glycemic control    05/21/19 2304           Vitals/Pain Today's Vitals   05/22/19 1130 05/22/19 1145 05/22/19 1200 05/22/19 1215  BP: (!) 136/92 (!) 144/80 (!) 132/91 (!) 152/104  Pulse: 74 73 73 74  Resp:      Temp:      TempSrc:      SpO2: 99% 95% 96% 98%  Weight:      Height:      PainSc:        Isolation Precautions No active isolations  Medications Medications  oxyCODONE-acetaminophen (PERCOCET/ROXICET) 5-325 MG per tablet 1 tablet (has no administration in time range)  amiodarone (PACERONE) tablet 200 mg (200   mg Oral Given 05/21/19 2349)  atorvastatin (LIPITOR) tablet 10 mg (10 mg Oral Given 05/22/19 1128)  carvedilol (COREG) tablet 25 mg (25 mg Oral Given 05/22/19 1129)  isosorbide-hydrALAZINE (BIDIL) 20-37.5 MG per tablet 1 tablet (1 tablet Oral Given 05/21/19 2350)  insulin glargine (LANTUS) injection 1 Units (1 Units Subcutaneous Given 05/21/19 2350)  apixaban (ELIQUIS) tablet 5 mg (5 mg Oral Given 05/22/19 1130)  ferrous sulfate tablet 325 mg (325 mg Oral Given 05/22/19 1130)  gabapentin (NEURONTIN) capsule 300 mg (has no administration in time range)  silver sulfADIAZINE (SILVADENE) 1 % cream 1 application (has no administration in time range)  sodium chloride flush (NS) 0.9 % injection 3 mL (has no administration in time range)  sodium chloride flush (NS) 0.9 % injection 3 mL (has no administration in time range)  0.9 %  sodium chloride infusion (has no administration in time range)  acetaminophen (TYLENOL) tablet 650 mg (has no administration in time range)  ondansetron (ZOFRAN) injection 4 mg (has no administration in time range)  furosemide (LASIX) injection 60 mg (60 mg Intravenous Given 05/22/19 1128)  potassium chloride (KLOR-CON) CR tablet 10 mEq (10 mEq Oral Given 05/22/19 1130)  insulin aspart (novoLOG) injection 0-20 Units (0 Units Subcutaneous Not Given 05/22/19 0755)  furosemide (LASIX) 100 mg in dextrose 5 % 50 mL IVPB (0 mg Intravenous Stopped 05/21/19 1955)    Mobility non-ambulatory Low fall  risk   Focused Assessments Cardiac Assessment Handoff:  Cardiac Rhythm: Normal sinus rhythm Lab Results  Component Value Date   CKTOTAL 98 02/15/2012   CKMB 1.8 02/15/2012   TROPONINI 0.10 (HH) 11/28/2017   Lab Results  Component Value Date   DDIMER  11/10/2008    0.41        AT THE INHOUSE ESTABLISHED CUTOFF VALUE OF 0.48 ug/mL FEU, THIS ASSAY HAS BEEN DOCUMENTED IN THE LITERATURE TO HAVE A SENSITIVITY AND NEGATIVE PREDICTIVE VALUE OF AT LEAST 98 TO 99%.  THE TEST RESULT SHOULD BE CORRELATED WITH AN ASSESSMENT OF THE CLINICAL PROBABILITY OF DVT / VTE.   Does the Patient currently have chest pain? No     R Recommendations: See Admitting Provider Note  Report given to:   Additional Notes:    

## 2019-05-22 NOTE — Progress Notes (Signed)
PROGRESS NOTE    Patient: Joshua Massey                            PCP: [name], [name], MD                    DOB: Jun 29, 1975            DOA: 05/21/2019 [id number]             DOS: 05/22/2019, 7:05 AM   LOS: 1 day   Date of Service: The patient was seen and examined on 05/22/2019  Subjective:   Patient was seen and examined this morning, stable Denies of having any shortness of breath chest pain at this time.  Brief Narrative:   Joshua Massey is a 43 y.o. 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 fib on Eliquis,and combined systolic and diastolic CHF who presents w SOB, edema after not taking his diuretic for 2 days.  Patient reports he ran out of his torsemide 2 days ago. He was taking 60 mg daily.He was recently prescribe a higher dose of 100 mg daily which he has not picked up.He is followed in the heart failure clinic with last virtual visit with Dr. Sung Amabile in June '20. His last echo  March 12,2020 revealed EF of  20 to 123456 with diastolic dysfunction.  He eports he ran out of batteries for his scale at homeand is not sure how much weight gain he has had recently.   ED Course:  Hemodynamically stable, Lab revealed K 3.2, BNP 1,296, CXR with mild pulmonary edema. Exam revealed increased peripheral edema.  Admitted for acute on chronic systolic CHF.   Assessment & Plan:   Active Problems:   Hypertension   Obesity   Acute on chronic systolic CHF (congestive heart failure) (HCC)   Type 2 diabetes mellitus with vascular disease (HCC)   CKD (chronic kidney disease), stage IV (HCC)   Acute on chronic systolic heart failure (HCC)    Acute on chronic s&d CHF w EJF 20-25% -Remains symptomatic with shortness of breath, edema, pulmonary edema -Monitoring I's and O's, daily weight, -Initiated IV torsemide 60 mg 3 times daily -Heart failure team consulted  appreciate their input and management.  -Continue home meds with exception of ACE/ ARBs due to CKD - March 12,2020 revealed EF of  20 to 123456 with systolic/diastolic dysfunction.   Poorly controlled diabetes mellitus type 2 -Last A1c December 2019,  8.5% -Repeating A1c -Checking CBG QA CHS, SSI -  Continue Lantus,    Adamantly encouraged to have PCP schedule opthal exam   CKD IV  -Creatinine 4.95  >>>  4.79 (baseline around 3.6) -Worsening creatinine compared to previous settings -Monitor BUN/creatinine closely, trying to avoid nephrotoxins -Not on any ACE inhibitor's or ARBs due to CKD  History of hypertension -We will continue current meds including Coreg, diuretics, -Titrating meds for better blood pressure control  History of A. Fib -  Continue Eliquis, beta-blocker, amiodarone    DVT prophylaxis: eliquis Code Status: full code  Family Communication:  None present at bedside Disposition Plan: home 2-3 days Consults called: Heart failure team consult put in  Admission status: inpatient tele -based inpatient criteria due to symptomatic acute on chronic CHF requiring IV aggressive diuretics evaluation from heart team    Procedures:   No admission procedures for  hospital encounter.    Antimicrobials:  Anti-infectives (From admission, onward)   None       Medication:  . amiodarone  200 mg Oral BID  . apixaban  5 mg Oral BID  . atorvastatin  10 mg Oral Daily  . carvedilol  25 mg Oral BID WC  . ferrous sulfate  325 mg Oral BID  . furosemide  60 mg Intravenous TID  . insulin aspart  0-20 Units Subcutaneous TID WC  . insulin glargine  1 Units Subcutaneous QHS  . isosorbide-hydrALAZINE  1 tablet Oral BID  . potassium chloride  10 mEq Oral Daily  . sodium chloride flush  3 mL Intravenous Q12H    sodium chloride, acetaminophen, gabapentin, ondansetron (ZOFRAN) IV, oxyCODONE-acetaminophen, silver sulfADIAZINE, sodium chloride flush   Objective:   Vitals:    05/22/19 0200 05/22/19 0400 05/22/19 0500 05/22/19 0600  BP: 130/82 (!) 120/49 (!) 157/91   Pulse: 72 76 74 76  Resp: 16 17 17 14   Temp:      TempSrc:      SpO2: 95% 96% 94% 95%  Weight:      Height:       No intake or output data in the 24 hours ending 05/22/19 0705 Filed Weights   05/21/19 1622  Weight: 136.1 kg     Examination:   Physical Exam  Constitution: Obese, alert, cooperative, no distress,  Appears calm and comfortable  Psychiatric: Normal and stable mood and affect, cognition intact,   HEENT: Normocephalic, PERRL, otherwise with in Normal limits  Chest:Chest symmetric Cardio vascular:  S1/S2, RRR, No murmure, No Rubs or Gallops  pulmonary: Clear to auscultation bilaterally, respirations unlabored, negative wheezes / crackles Abdomen: Soft, non-tender, non-distended, bowel sounds,no masses, no organomegaly Muscular skeletal: Limited exam - in bed, able to move all 4 extremities, Normal strength,  Neuro: CNII-XII intact. , normal motor and sensation, reflexes intact  Extremities: ++ pitting edema lower extremities, +2 pulses  Skin: Dry, warm to touch, negative for any Rashes, No open wounds Wounds: none visible -please see nursing documentation     LABs:  CBC Latest Ref Rng & Units 05/21/2019 09/05/2018 09/04/2018  WBC 4.0 - 10.5 K/uL 5.0 5.3 4.8  Hemoglobin 13.0 - 17.0 g/dL 11.5(L) 11.8(L) 11.5(L)  Hematocrit 39.0 - 52.0 % 37.1(L) 36.6(L) 37.6(L)  Platelets 150 - 400 K/uL 181 262 230   CMP Latest Ref Rng & Units 05/22/2019 05/21/2019 09/06/2018  Glucose 70 - 99 mg/dL 91 112(H) 124(H)  BUN 6 - 20 mg/dL 95(H) 94(H) 73(H)  Creatinine 0.61 - 1.24 mg/dL 4.79(H) 4.95(H) 3.74(H)  Sodium 135 - 145 mmol/L 141 143 137  Potassium 3.5 - 5.1 mmol/L 4.5 4.6 4.4  Chloride 98 - 111 mmol/L 108 106 102  CO2 22 - 32 mmol/L 21(L) 23 25  Calcium 8.9 - 10.3 mg/dL 8.3(L) 8.4(L) 8.7(L)  Total Protein 6.5 - 8.1 g/dL - 7.3 -  Total Bilirubin 0.3 - 1.2 mg/dL - 2.0(H) -  Alkaline Phos  38 - 126 U/L - 79 -  AST 15 - 41 U/L - 20 -  ALT 0 - 44 U/L - 16 -        SIGNED: Deatra James, MD, FACP, FHM. [facility],  Pager [phone]  If 7PM-7AM, please contact night-coverage [email or web address], Password [facility] 05/22/2019, 7:05 AM

## 2019-05-22 NOTE — ED Notes (Signed)
Lunch Tray Ordered @ 1310. 

## 2019-05-22 NOTE — Progress Notes (Signed)
Patient arrived from ED to [facility], patient CHG bath completed and vital signs obtained. Sayer Masini, Bettina Gavia RN

## 2019-05-22 NOTE — ED Notes (Signed)
Tele ordered bfast 

## 2019-05-22 NOTE — Consult Note (Signed)
Reason for Consult: AKI/CKD stage 4-5 Referring Physician: Linda Hedges, MD  Joshua Massey is an 43 y.o. male.  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 Atrial fibrillation on Eliquis, who presented with progressive increase in lower ext edema, abdominal girth, DOE, orthopnea, and weight gain after he ran out of his torsemide.  He admits to taking less to "help it stretch till the end of the month".   He was admitted with acute on chronic CHF and we were consulted to further evaluate and manage his AKI/CKD stage 4 presumably due to cardiorenal syndrome.  The trend in Scr is seen below.  He denies any N/V/D, anorexia, dysgeusia, dysuria, pyuria, hematuria, urgency, frequency, or retention.   Trend in Creatinine: Creatinine, Ser  Date/Time Value Ref Range Status  05/22/2019 03:01 AM 4.79 (H) 0.61 - 1.24 mg/dL Final  05/21/2019 03:00 PM 4.95 (H) 0.61 - 1.24 mg/dL Final  09/06/2018 05:00 AM 3.74 (H) 0.61 - 1.24 mg/dL Final  09/05/2018 03:32 AM 3.65 (H) 0.61 - 1.24 mg/dL Final  09/04/2018 04:14 AM 3.69 (H) 0.61 - 1.24 mg/dL Final  09/03/2018 05:53 AM 3.61 (H) 0.61 - 1.24 mg/dL Final  09/02/2018 07:20 AM 3.55 (H) 0.61 - 1.24 mg/dL Final  09/01/2018 04:45 AM 3.62 (H) 0.61 - 1.24 mg/dL Final  08/31/2018 05:01 AM 3.74 (H) 0.61 - 1.24 mg/dL Final  08/30/2018 05:42 AM 3.54 (H) 0.61 - 1.24 mg/dL Final  08/29/2018 12:29 PM 3.60 (H) 0.61 - 1.24 mg/dL Final  08/28/2018 04:24 PM 3.64 (H) 0.61 - 1.24 mg/dL Final  08/07/2018 12:26 PM 3.25 (H) 0.40 - 1.50 mg/dL Final  07/04/2018 11:20 AM 3.25 (H) 0.61 - 1.24 mg/dL Final  06/14/2018 02:17 AM 4.73 (H) 0.61 - 1.24 mg/dL Final  06/13/2018 06:05 AM 4.66 (H) 0.61 - 1.24 mg/dL Final  06/12/2018 03:12 AM 4.55 (H) 0.61 - 1.24 mg/dL Final  06/11/2018 02:40 AM 4.19 (H) 0.61 - 1.24 mg/dL Final  06/10/2018 03:38 AM 4.28 (H)  0.61 - 1.24 mg/dL Final  06/09/2018 05:56 PM 4.40 (H) 0.61 - 1.24 mg/dL Final  12/07/2017 05:08 AM 3.35 (H) 0.61 - 1.24 mg/dL Final  12/06/2017 05:04 AM 3.23 (H) 0.61 - 1.24 mg/dL Final  12/05/2017 04:49 AM 3.58 (H) 0.61 - 1.24 mg/dL Final  12/04/2017 04:43 AM 3.35 (H) 0.61 - 1.24 mg/dL Final  12/03/2017 03:47 PM 3.47 (H) 0.61 - 1.24 mg/dL Final  12/03/2017 08:31 AM 3.50 (H) 0.61 - 1.24 mg/dL Final  12/02/2017 03:56 PM 3.92 (H) 0.61 - 1.24 mg/dL Final  12/02/2017 04:05 AM 4.25 (H) 0.61 - 1.24 mg/dL Final  12/01/2017 05:22 AM 4.78 (H) 0.61 - 1.24 mg/dL Final  11/29/2017 08:02 AM 5.32 (H) 0.61 - 1.24 mg/dL Final  11/28/2017 04:07 AM 5.39 (H) 0.61 - 1.24 mg/dL Final  11/28/2017 04:07 AM 5.51 (H) 0.61 - 1.24 mg/dL Final  11/27/2017 02:22 PM 5.00 (H) 0.61 - 1.24 mg/dL Final  10/11/2016 03:58 PM 2.03 (H) 0.61 - 1.24 mg/dL Final  09/13/2016 03:25 PM 2.30 (H) 0.61 - 1.24 mg/dL Final  09/02/2016 03:06 AM 2.39 (H) 0.61 - 1.24 mg/dL Final  09/01/2016 02:29 AM 2.60 (H) 0.61 - 1.24 mg/dL Final  08/31/2016 01:48 AM 2.49 (H) 0.61 - 1.24 mg/dL Final  08/30/2016 02:36 AM 2.38 (H) 0.61 - 1.24 mg/dL Final  08/29/2016 01:55 AM 2.27 (H) 0.61 - 1.24 mg/dL Final  08/28/2016 04:47 AM 2.20 (  H) 0.61 - 1.24 mg/dL Final  08/27/2016 03:19 AM 2.10 (H) 0.61 - 1.24 mg/dL Final  08/26/2016 03:26 AM 2.19 (H) 0.61 - 1.24 mg/dL Final  08/25/2016 04:54 AM 2.17 (H) 0.61 - 1.24 mg/dL Final  08/23/2016 02:27 AM 2.28 (H) 0.61 - 1.24 mg/dL Final  08/22/2016 01:47 AM 2.26 (H) 0.61 - 1.24 mg/dL Final  08/21/2016 03:10 AM 2.19 (H) 0.61 - 1.24 mg/dL Final  08/20/2016 05:52 PM 2.19 (H) 0.61 - 1.24 mg/dL Final  08/19/2016 03:23 AM 2.40 (H) 0.61 - 1.24 mg/dL Final  08/18/2016 03:13 AM 2.37 (H) 0.61 - 1.24 mg/dL Final  08/17/2016 07:53 AM 2.42 (H) 0.61 - 1.24 mg/dL Final  08/17/2016 02:41 AM 2.44 (H) 0.61 - 1.24 mg/dL Final    PMH:   Past Medical History:  Diagnosis Date  . CHF (congestive heart failure) (HCC)    nonischemic  cardiopathy  . Chronic kidney disease   . Diabetes mellitus    adult-onset, type 2  . Exogenous obesity   . GERD (gastroesophageal reflux disease)   . Hypertension   . Left foot infection 06/21/2018  . Osteomyelitis of foot, left, acute ([place]) 07/04/2018    PSH:   Past Surgical History:  Procedure Laterality Date  . ANKLE SURGERY  2003   plate and screws  . APPLICATION OF WOUND VAC Left 06/12/2018   Procedure: APPLICATION OF WOUND VAC;  Surgeon: Mcarthur Rossetti, MD;  Location: [place];  Service: Orthopedics;  Laterality: Left;  . CARDIAC CATHETERIZATION  02/16/2012   50% LAD lesion in mid vessel, otherwise no significant obstructive CAD  . HIP PINNING  1990   bilateral  . I&D EXTREMITY Left 06/10/2018   Procedure: IRRIGATION AND DEBRIDEMENT OF FOOT;  Surgeon: Mcarthur Rossetti, MD;  Location: [place];  Service: Orthopedics;  Laterality: Left;  . I&D EXTREMITY Left 06/12/2018   Procedure: REPEAT IRRIGATION AND DEBRIDEMENT LEFT FOOT;  Surgeon: Mcarthur Rossetti, MD;  Location: [place];  Service: Orthopedics;  Laterality: Left;  . I&D EXTREMITY Left 07/04/2018   Procedure: DEBRIDEMENT OF LEFT FOOT;  Surgeon: Newt Minion, MD;  Location: [place];  Service: Orthopedics;  Laterality: Left;  . IR GENERIC HISTORICAL  09/01/2016   IR FLUORO GUIDE CV LINE RIGHT 09/01/2016 Marybelle Killings, MD [facility]  . IR GENERIC HISTORICAL  09/01/2016   IR US GUIDE VASC ACCESS RIGHT 09/01/2016 Marybelle Killings, MD [facility]  . IR GENERIC HISTORICAL  09/16/2016   IR REMOVAL TUN CV CATH W/O FL 09/16/2016 Arne Cleveland, MD [facility]  . KNEE ARTHROSCOPY Left 08/30/2016   Procedure: ARTHROSCOPY KNEE;  Surgeon: Renette Butters, MD;  Location: [place];  Service: Orthopedics;  Laterality: Left;  . LEFT AND RIGHT HEART CATHETERIZATION WITH CORONARY ANGIOGRAM N/A 02/16/2012   Procedure: LEFT AND RIGHT HEART CATHETERIZATION WITH CORONARY ANGIOGRAM;  Surgeon: Pixie Casino, MD;  Location: [facility] C[facility] LAB;   Service: Cardiovascular;  Laterality: N/A;  . TRANSTHORACIC ECHOCARDIOGRAM  08/31/2012   EF 35-40%, no significant wall motion abnormalities, diastolic relaxation abnormality - no longer needed LifeVest; LV cavity size mod dilated with mod conc hypertrophy, systolic function mod reduced; mild MR, LA mod dilated    Allergies: No Known Allergies  Medications:   Prior to Admission medications   Medication Sig Start Date End Date Taking? Authorizing Provider  amiodarone (PACERONE) 200 MG tablet Take 1 tablet (200 mg total) by mouth 2 (two) times daily. 11/06/18  Yes [name], [name], MD  apixaban Arne Cleveland) 5  MG TABS tablet Take 1 tablet (5 mg total) by mouth 2 (two) times daily. 11/06/18  Yes [name], [name], MD  atorvastatin (LIPITOR) 10 MG tablet Take 1 tablet (10 mg total) by mouth daily with supper. Patient taking differently: Take 10 mg by mouth every morning.  08/07/18  Yes [name], [name], MD  carvedilol (COREG) 25 MG tablet Take 1 tablet (25 mg total) by mouth 2 (two) times daily with a meal. 08/07/18  Yes [name], [name], MD  Continuous Blood Gluc Receiver (FREESTYLE LIBRE 14 DAY READER) DEVI 1 Device by Does not apply route daily. 11/06/18  Yes [name], [name], MD  Continuous Blood Gluc Sensor (FREESTYLE LIBRE 14 DAY SENSOR) MISC Inject 1 Device into the skin 4 (four) times daily as needed. 11/06/18  Yes [name], [name], MD  Ferrous Sulfate (IRON) 325 (65 Fe) MG TABS TAKE 1 TABLET BY MOUTH TWICE DAILY WITH MEALS . APPOINTMENT REQUIRED FOR FUTURE REFILLS Patient taking differently: Take 1 tablet by mouth 2 (two) times daily.  04/12/19  Yes [name], [name], MD  gabapentin (NEURONTIN) 300 MG capsule Take 1 capsule (300 mg total) by mouth at bedtime. Patient taking differently: Take 300 mg by mouth at bedtime as needed (pain).  10/25/18  Yes Rayburn, Neta Mends, PA-C  glucose blood (FREESTYLE TEST STRIPS) test strip Use as instructed 09/02/16  Yes Ghimire, Henreitta Leber, MD  glucose monitoring kit  (FREESTYLE) monitoring kit 1 each by Does not apply route 4 (four) times daily - after meals and at bedtime. 1 month Diabetic Testing Supplies for QAC-QHS accuchecks. 09/02/16  Yes Ghimire, Henreitta Leber, MD  insulin aspart (NOVOLOG FLEXPEN) 100 UNIT/ML FlexPen Inject 0-15 Units into the skin 3 (three) times daily with meals. 0-15U/Subcu/TIDw/meals//CBG70-120:0U/CBG121-150:2U/CBG151-200:3U/CBG201-250:5U/CBG251-300:8U/CBG301-350:11U/CBG351-400:15U//CBG>400:Call MD 11/14/18  Yes [name], [name], MD  Insulin Glargine (LANTUS) 100 UNIT/ML Solostar Pen Inject 5 Units into the skin at bedtime. Patient taking differently: Inject 1 Units into the skin at bedtime.  11/06/18  Yes [name], [name], MD  Insulin Pen Needle 32G X 8 MM MISC Use as directed 09/02/16  Yes Ghimire, Henreitta Leber, MD  isosorbide-hydrALAZINE (BIDIL) 20-37.5 MG tablet Take 1 tablet by mouth 2 (two) times daily. 09/06/18  Yes Bonnielee Haff, MD  Lancets (FREESTYLE) lancets Use as instructed 09/02/16  Yes Ghimire, Henreitta Leber, MD  Multiple Vitamin (MULTIVITAMIN WITH MINERALS) TABS tablet Take 1 tablet by mouth daily. 09/07/18  Yes Bonnielee Haff, MD  oxyCODONE-acetaminophen (PERCOCET/ROXICET) 5-325 MG tablet Take 1 tablet by mouth every 8 (eight) hours as needed for moderate pain or severe pain. 10/25/18  Yes Rayburn, Neta Mends, PA-C  silver sulfADIAZINE (SILVADENE) 1 % cream Apply topically daily. Silvadene plus dry dressing to plantar ulcer left foot.  Change daily. Patient taking differently: Apply 1 application topically daily as needed (skin care). Silvadene plus dry dressing to plantar ulcer left foot.  Change daily. 09/07/18  Yes Bonnielee Haff, MD  torsemide (DEMADEX) 100 MG tablet Take 1 tablet (100 mg total) by mouth daily. Patient taking differently: Take 20 mg by mouth 3 (three) times daily.  11/06/18  Yes [name], [name], MD  Vitamin D, Ergocalciferol, (DRISDOL) 1.25 MG (50000 UT) CAPS capsule Take 1 capsule (50,000 Units total) by mouth  every 7 (seven) days. 11/06/18  Yes [name], [name], MD  torsemide (DEMADEX) 20 MG tablet TAKE 3 TABLETS BY MOUTH ONCE DAILY . APPOINTMENT REQUIRED FOR FUTURE REFILLS Patient not taking: Reported on 05/21/2019 05/21/19   [name], [name], MD  Inpatient medications: . amiodarone  200 mg Oral BID  . apixaban  5 mg Oral BID  . atorvastatin  10 mg Oral Daily  . carvedilol  25 mg Oral BID WC  . ferrous sulfate  325 mg Oral BID  . furosemide  60 mg Intravenous TID  . insulin aspart  0-20 Units Subcutaneous TID WC  . insulin glargine  1 Units Subcutaneous QHS  . isosorbide-hydrALAZINE  1 tablet Oral BID  . potassium chloride  10 mEq Oral Daily  . sodium chloride flush  3 mL Intravenous Q12H    Discontinued Meds:   Medications Discontinued During This Encounter  Medication Reason  . potassium chloride SA (K-DUR) 20 MEQ tablet Patient Preference  . acetaminophen (TYLENOL) 500 MG tablet Patient Preference  . insulin aspart (novoLOG) injection 0-15 Units     Social History:  reports that he has never smoked. He has never used smokeless tobacco. He reports that he does not drink alcohol or use drugs.  Family History:   Family History  Problem Relation Age of Onset  . Coronary artery disease Brother        CABG at 17, DM  . Heart disease Brother 41       CAD  . Diabetes Mother   . Hypertension Mother   . Hyperlipidemia Mother   . Heart disease Mother   . Diabetes Father   . Hypertension Father     Pertinent items are noted in HPI. Weight change:   Intake/Output Summary (Last 24 hours) at 05/22/2019 1550 Last data filed at 05/22/2019 1418 Gross per 24 hour  Intake -  Output 2440 ml  Net -2440 ml   BP (!) 142/103 (BP Location: Right Arm)   Pulse 82   Temp (!) 96.1 F (35.6 C) (Axillary)   Resp 20   Ht 5' 11"  (1.803 m)   Wt 136.1 kg   SpO2 96%   BMI 41.84 kg/m  Vitals:   05/22/19 1345 05/22/19 1400 05/22/19 1415 05/22/19 1444  BP: (!) 137/95 123/85 (!) 138/118 (!)  142/103  Pulse: 77 75  82  Resp:    20  Temp:    (!) 96.1 F (35.6 C)  TempSrc:    Axillary  SpO2: 94% 98%  96%  Weight:      Height:         General appearance: alert, cooperative, no distress and morbidly obese Head: Normocephalic, without obvious abnormality, atraumatic Back: 3+ pitting edema Resp: diminished breath sounds LLL > RLL Cardio: faint HS, no rub GI: obese, +BS, +pitting edema along abd wall Extremities: edema 2+ tense lower extremity edema Neuro: no asterixis  Labs: Basic Metabolic Panel: Recent Labs  Lab 05/21/19 1500 05/21/19 1651 05/22/19 0301  NA 143  --  141  K 4.6  --  4.5  CL 106  --  108  CO2 23  --  21*  GLUCOSE 112*  --  91  BUN 94*  --  95*  CREATININE 4.95*  --  4.79*  ALBUMIN  --  2.8*  --   CALCIUM 8.4*  --  8.3*   Liver Function Tests: Recent Labs  Lab 05/21/19 1651  AST 20  ALT 16  ALKPHOS 79  BILITOT 2.0*  PROT 7.3  ALBUMIN 2.8*   No results for input(s): LIPASE, AMYLASE in the last 168 hours. No results for input(s): AMMONIA in the last 168 hours. CBC: Recent Labs  Lab 05/21/19 1500  WBC 5.0  HGB 11.5*  HCT 37.1*  MCV 96.6  PLT 181   PT/INR: @LABRCNTIP (inr:5) Cardiac Enzymes: )No results for input(s): CKTOTAL, CKMB, CKMBINDEX, TROPONINI in the last 168 hours. CBG: Recent Labs  Lab 05/22/19 0753  GLUCAP 84    Iron Studies: No results for input(s): IRON, TIBC, TRANSFERRIN, FERRITIN in the last 168 hours.  Xrays/Other Studies: Dg Chest 2 View  Result Date: 05/22/2019 CLINICAL DATA:  Congestive heart failure. EXAM: CHEST - 2 VIEW COMPARISON:  05/21/2019 FINDINGS: Very low lung volumes limit evaluation. Lordotic positioning also noted. Cardiomegaly remains stable. Both lungs are grossly clear. IMPRESSION: Very low lung volumes limit evaluation. Stable cardiomegaly. No active lung disease. Electronically Signed   By: Marlaine Hind M.D.   On: 05/22/2019 08:55   Dg Chest 2 View  Result Date: 05/21/2019 CLINICAL  DATA:  43 year old male with history of increasing edema and weight gain. History of congestive heart failure. EXAM: CHEST - 2 VIEW COMPARISON:  Chest x-ray 08/29/2018. FINDINGS: Lung volumes are low. No consolidative airspace disease. No pleural effusions. Cephalization of the pulmonary vasculature with mild indistinctness of interstitial markings and moderate to severe cardiomegaly. Upper mediastinal contours are within normal limits. IMPRESSION: 1. The appearance the chest suggests mild congestive heart failure. Electronically Signed   By: Vinnie Langton M.D.   On: 05/21/2019 18:09     Assessment/Plan: 1.  AKI/CKD stage 4- presumably due to cardiorenal syndrome.  He is well known to our service and has been educated on the severity and chronicity of his kidney disease, however he continues to cancel outpatient follow up.  He acts surprised to hear about his kidney function despite recent admission in March with similar presentation and discussion.  Continue with IV diuresis and follow renal function.  I discussed the eventual need for dialysis if he would be compliant.  No uremic symptoms at this time and will follow UOP and Scr for now.   2. Acute on chronic diastolic and systolic CHF- due to noncompliance with medications and follow up.  Heart failure is following and currently on IV lasix 60 mg tid with UF of 2.4 liters overnight.  Continue with that dose and frequency for now. 3. Atrial flutter- per cardiology.  Currently rate controlled with beta-blocker 4. DM- poorly controlled 5. HTN- stable 6. Morbid obesity 7. Anemia of CKD- stable 8. Medical noncompliance- ongoing issue.   Joshua Massey 05/22/2019, 3:50 PM

## 2019-05-22 NOTE — Consult Note (Signed)
[place] Nurse Consult Note: Patient receiving care at [place].   Reason for Consult: weeping legs open sores on ankles. Wound type: Patient has superficial wounds to the right posterior achilles area that measures 2 cm x 2 cm and is 100% pink and clean.  The right LE lateral aspect as two small abrasion that are also 100% pink and clean.  The left dorsal foot has a superficial wound that measures 1.6 cm x 4 cm and is 100% clean and pink.  The patient states these started from compression stockings that rolled and pleated and caused pressure to the areas.   Patient states he has worn unna boots in the past and they worked well for him.   Dressing procedure/placement/frequency: unna boots bilaterally, change weekly. Patient will need follow up on an outpatient basis if the therapy is to be continued after discharge.  Thank you for the consult.  Discussed plan of care with the patient and bedside nurse.  [place] nurse will not follow at this time.  Please re-consult the [place] team if needed.  Val Riles, RN, MSN, CWOCN, CNS-BC, pager [phone]

## 2019-05-22 NOTE — Progress Notes (Signed)
Orthopedic Tech Progress Note Patient Details:  Joshua Massey 1976-02-12 [id number]  Ortho Devices Type of Ortho Device: Louretta Parma boot Ortho Device/Splint Location: bilateral Ortho Device/Splint Interventions: Application, Ordered   Post Interventions Patient Tolerated: Well Instructions Provided: Care of device, Adjustment of device   [name] 05/22/2019, 5:41 PM

## 2019-05-22 NOTE — Consult Note (Addendum)
Advanced Heart Failure Team Consult Note   Primary Physician: [name], [name], MD PCP-Cardiologist:  Dr. Aundra Dubin   Reason for Consultation: Acute on Chronic HF  HPI:    Joshua Massey is seen today for evaluation of acute on chronic systolic HF a/ massive anasarca at the request of Dr. Roger Shelter, Internal Medicine.   Joshua Massey is a 43 y.o. male with a history of DM, HTN, and chronic systolic heart failure (diagnosed in 2010 EF 30-35%), CKD, super morbid obesity, poor compliance and poor health literacy/insight.  Admitted 2/27 through 09/02/16 with marked volume overload. Diuresed with IV lasix and transitioned to torsemide 60 mg daily. Hospital course was complicated by septic left knee. Discharged on IV antibiotics.  Discharge weight was 305 pounds.   Admitted 6/10-6/20/19 with volume overload and AKI. Creatinine peaked at 5.5. Nephrology consulted. Diuresed 70 lbs with high dose IV lasix and metolazone with improvement. Transitioned to torsemide 60 mg daily. He had new atrial flutter. HF team consulted. He was started on amio taper and Eliquis with return to NSR. DC weight 289 lbs.  Admitted 12/21-12/26/19 with diabetic foot ulcer. Found to have bilateral gangrenous lesion on feet. Ortho consulted and he underwent I&D of left plantar foot abscess 12/22 with repeat I&D with wound vac placement 12/24. Wound vac removed prior to DC.   Underwent debridement of foot on 07/04/18. DC weight 286 lbs. Has been followed by Dr. Sharol Given.   As noted above he has history of poor compliance and poor health literacy/insight.  He presents to the ED today with complaints of progressive weight gain, abdominal distention/edema, lower extremity and scrotal edema that has worsened over the last 1 to 2 months.  He reports that he was running low on his torsemide and was unable to get refills due to failure to follow-up with his PCP who is the primary prescriber.  He has been instructed to take 100 mg  of torsemide daily but self reduced doses down to 60 mg daily in an effort to conserve his supply.  He completely ran out of torsemide 2 days ago and has also been out of BiDil.  He reports compliance with all of his other medications.  Subsequently this led to progressive fluid retention.  Now w/ massive anasarca in the ED.  He also admits to dietary indiscretion with sodium.  He has not been compliant with daily weights at home.  His scale is no longer working.  He denies any significant resting dyspnea.  No orthopnea or PND.  He is pretty sedentary.  Mainly uses a scooter for mobilization.  BP in the ED is elevated 138/118.  Renal function has worsened.  Serum creatinine 4.95.  Previous baseline in the 3 range.  He is still making urine.  As received doses of IV Lasix in the ED. No recent f/u w/ nephrology per pt report.     Echo6/04/2018 15-20%, Grade 2 DD, Mild MR, Severe LAE, RV severely dilated and reduced function, Mod RAE, Mild TR, PA peak pressure 42 mm Hg. ECHO 07/2016 EF 25-30%.  ECHO 08/2012 EF 35-40% ECHO 02/15/12 EF 25%   02/16/12 RHC/LHC  RA - 28  RV - 61/29  PA - 63/18 (31)  FCO/CI - 4.28/1.59  TDCO/CI - 3.46/1.28  AO Sat - 94% (room air)  PA Sat - 49% (room air)  LVEDP 28  PVR = 0.8 [place]  Moderate tubular mid-LAD lesion, otherwise no significant obstructive CAD.    Review of Systems: [  y] = yes, [ ]  = no    General: Weight gain [ Y]; Weight loss [ ] ; Anorexia [ ] ; Fatigue [ ] ; Fever [ ] ; Chills [ ] ; Weakness [ ]    Cardiac: Chest pain/pressure [ ] ; Resting SOB [ ] ; Exertional SOB [ ] ; Orthopnea [ ] ; Pedal Edema Blue.Reese ]; Palpitations [ ] ; Syncope [ ] ; Presyncope [ ] ; Paroxysmal nocturnal dyspnea[ ]    Pulmonary: Cough [ ] ; Wheezing[ ] ; Hemoptysis[ ] ; Sputum [ ] ; Snoring [ ]    GI: Vomiting[ ] ; Dysphagia[ ] ; Melena[ ] ; Hematochezia [ ] ; Heartburn[ ] ; Abdominal pain [ ] ; Constipation [ ] ; Diarrhea [ ] ; BRBPR [ ]    GU: Hematuria[ ] ; Dysuria [ ] ; Nocturia[ ]    Vascular:  Pain in legs with walking [ ] ; Pain in feet with lying flat [ ] ; Non-healing sores [ ] ; Stroke [ ] ; TIA [ ] ; Slurred speech [ ] ;   Neuro: Headaches[ ] ; Vertigo[ ] ; Seizures[ ] ; Paresthesias[ ] ;Blurred vision [ ] ; Diplopia [ ] ; Vision changes [ ]    Ortho/Skin: Arthritis [ ] ; Joint pain [ ] ; Muscle pain [ ] ; Joint swelling [ ] ; Back Pain [ ] ; Rash [ ]    Psych: Depression[ ] ; Anxiety[ ]    Heme: Bleeding problems [ ] ; Clotting disorders [ ] ; Anemia [ ]    Endocrine: Diabetes [ ] ; Thyroid dysfunction[ ]   Home Medications Prior to Admission medications   Medication Sig Start Date End Date Taking? Authorizing Provider  amiodarone (PACERONE) 200 MG tablet Take 1 tablet (200 mg total) by mouth 2 (two) times daily. 11/06/18  Yes [name], [name], MD  apixaban (ELIQUIS) 5 MG TABS tablet Take 1 tablet (5 mg total) by mouth 2 (two) times daily. 11/06/18  Yes [name], [name], MD  atorvastatin (LIPITOR) 10 MG tablet Take 1 tablet (10 mg total) by mouth daily with supper. Patient taking differently: Take 10 mg by mouth every morning.  08/07/18  Yes [name], [name], MD  carvedilol (COREG) 25 MG tablet Take 1 tablet (25 mg total) by mouth 2 (two) times daily with a meal. 08/07/18  Yes [name], [name], MD  Continuous Blood Gluc Receiver (FREESTYLE LIBRE 14 DAY READER) DEVI 1 Device by Does not apply route daily. 11/06/18  Yes [name], [name], MD  Continuous Blood Gluc Sensor (FREESTYLE LIBRE 14 DAY SENSOR) MISC Inject 1 Device into the skin 4 (four) times daily as needed. 11/06/18  Yes [name], [name], MD  Ferrous Sulfate (IRON) 325 (65 Fe) MG TABS TAKE 1 TABLET BY MOUTH TWICE DAILY WITH MEALS . APPOINTMENT REQUIRED FOR FUTURE REFILLS Patient taking differently: Take 1 tablet by mouth 2 (two) times daily.  04/12/19  Yes [name], [name], MD  gabapentin (NEURONTIN) 300 MG capsule Take 1 capsule (300 mg total) by mouth at bedtime. Patient taking differently: Take 300 mg by mouth at bedtime as needed (pain).  10/25/18  Yes  Rayburn, Neta Mends, PA-C  glucose blood (FREESTYLE TEST STRIPS) test strip Use as instructed 09/02/16  Yes Ghimire, Henreitta Leber, MD  glucose monitoring kit (FREESTYLE) monitoring kit 1 each by Does not apply route 4 (four) times daily - after meals and at bedtime. 1 month Diabetic Testing Supplies for QAC-QHS accuchecks. 09/02/16  Yes Ghimire, Henreitta Leber, MD  insulin aspart (NOVOLOG FLEXPEN) 100 UNIT/ML FlexPen Inject 0-15 Units into the skin 3 (three) times daily with meals. 0-15U/Subcu/TIDw/meals//CBG70-120:0U/CBG121-150:2U/CBG151-200:3U/CBG201-250:5U/CBG251-300:8U/CBG301-350:11U/CBG351-400:15U//CBG>400:Call MD 11/14/18  Yes [name], [name], MD  Insulin Glargine (LANTUS) 100 UNIT/ML Solostar Pen Inject 5 Units into the  skin at bedtime. Patient taking differently: Inject 1 Units into the skin at bedtime.  11/06/18  Yes [name], [name], MD  Insulin Pen Needle 32G X 8 MM MISC Use as directed 09/02/16  Yes Ghimire, Henreitta Leber, MD  isosorbide-hydrALAZINE (BIDIL) 20-37.5 MG tablet Take 1 tablet by mouth 2 (two) times daily. 09/06/18  Yes Bonnielee Haff, MD  Lancets (FREESTYLE) lancets Use as instructed 09/02/16  Yes Ghimire, Henreitta Leber, MD  Multiple Vitamin (MULTIVITAMIN WITH MINERALS) TABS tablet Take 1 tablet by mouth daily. 09/07/18  Yes Bonnielee Haff, MD  oxyCODONE-acetaminophen (PERCOCET/ROXICET) 5-325 MG tablet Take 1 tablet by mouth every 8 (eight) hours as needed for moderate pain or severe pain. 10/25/18  Yes Rayburn, Neta Mends, PA-C  silver sulfADIAZINE (SILVADENE) 1 % cream Apply topically daily. Silvadene plus dry dressing to plantar ulcer left foot.  Change daily. Patient taking differently: Apply 1 application topically daily as needed (skin care). Silvadene plus dry dressing to plantar ulcer left foot.  Change daily. 09/07/18  Yes Bonnielee Haff, MD  torsemide (DEMADEX) 100 MG tablet Take 1 tablet (100 mg total) by mouth daily. Patient taking differently: Take 20 mg by mouth 3 (three)  times daily.  11/06/18  Yes [name], [name], MD  Vitamin D, Ergocalciferol, (DRISDOL) 1.25 MG (50000 UT) CAPS capsule Take 1 capsule (50,000 Units total) by mouth every 7 (seven) days. 11/06/18  Yes [name], [name], MD  torsemide (DEMADEX) 20 MG tablet TAKE 3 TABLETS BY MOUTH ONCE DAILY . APPOINTMENT REQUIRED FOR FUTURE REFILLS Patient not taking: Reported on 05/21/2019 05/21/19   [name], [name], MD    Past Medical History: Past Medical History:  Diagnosis Date   CHF (congestive heart failure) ([place])    nonischemic cardiopathy   Chronic kidney disease    Diabetes mellitus    adult-onset, type 2   Exogenous obesity    GERD (gastroesophageal reflux disease)    Hypertension    Left foot infection 06/21/2018   Osteomyelitis of foot, left, acute ([place]) 07/04/2018    Past Surgical History: Past Surgical History:  Procedure Laterality Date   ANKLE SURGERY  2003   plate and screws   APPLICATION OF WOUND VAC Left 06/12/2018   Procedure: APPLICATION OF WOUND VAC;  Surgeon: Mcarthur Rossetti, MD;  Location: [place];  Service: Orthopedics;  Laterality: Left;   CARDIAC CATHETERIZATION  02/16/2012   50% LAD lesion in mid vessel, otherwise no significant obstructive CAD   HIP PINNING  1990   bilateral   I&D EXTREMITY Left 06/10/2018   Procedure: IRRIGATION AND DEBRIDEMENT OF FOOT;  Surgeon: Mcarthur Rossetti, MD;  Location: [place];  Service: Orthopedics;  Laterality: Left;   I&D EXTREMITY Left 06/12/2018   Procedure: REPEAT IRRIGATION AND DEBRIDEMENT LEFT FOOT;  Surgeon: Mcarthur Rossetti, MD;  Location: [place];  Service: Orthopedics;  Laterality: Left;   I&D EXTREMITY Left 07/04/2018   Procedure: DEBRIDEMENT OF LEFT FOOT;  Surgeon: Newt Minion, MD;  Location: [place];  Service: Orthopedics;  Laterality: Left;   IR GENERIC HISTORICAL  09/01/2016   IR FLUORO GUIDE CV LINE RIGHT 09/01/2016 Marybelle Killings, MD [facility]   IR GENERIC HISTORICAL  09/01/2016   IR US GUIDE VASC  ACCESS RIGHT 09/01/2016 Marybelle Killings, MD [facility]   IR GENERIC HISTORICAL  09/16/2016   IR REMOVAL TUN CV CATH W/O FL 09/16/2016 Arne Cleveland, MD [facility]   KNEE ARTHROSCOPY Left 08/30/2016   Procedure: ARTHROSCOPY KNEE;  Surgeon: Edmonia Lynch  D, MD;  Location: [place];  Service: Orthopedics;  Laterality: Left;   LEFT AND RIGHT HEART CATHETERIZATION WITH CORONARY ANGIOGRAM N/A 02/16/2012   Procedure: LEFT AND RIGHT HEART CATHETERIZATION WITH CORONARY ANGIOGRAM;  Surgeon: Pixie Casino, MD;  [facility] C[facility];  Service: Cardiovascular;  Laterality: N/A;   TRANSTHORACIC ECHOCARDIOGRAM  08/31/2012   EF 35-40%, no significant wall motion abnormalities, diastolic relaxation abnormality - no longer needed LifeVest; LV cavity size mod dilated with mod conc hypertrophy, systolic function mod reduced; mild MR, LA mod dilated    Family History: Family History  Problem Relation Age of Onset   Coronary artery disease Brother        CABG at 80, DM   Heart disease Brother 52       CAD   Diabetes Mother    Hypertension Mother    Hyperlipidemia Mother    Heart disease Mother    Diabetes Father    Hypertension Father     Social History: Social History   Socioeconomic History   Marital status: Single    Spouse name: Not on file   Number of children: 6   Years of education: 12   Highest education level: Not on file  Occupational History   Occupation: Brewing technologist    Comment: Licensed conveyancer   Occupation: newpaper delivery    Employer: [employer]  Social Needs   Emergency planning/management officer strain: Not on file   Food insecurity    Worry: Not on file    Inability: Not on file   Transportation needs    Medical: Not on file    Non-medical: Not on file  Tobacco Use   Smoking status: Never Smoker   Smokeless tobacco: Never Used  Substance and Sexual Activity   Alcohol use: No   Drug use: No   Sexual activity: Yes    Birth control/protection:  Condom  Lifestyle   Physical activity    Days per week: Not on file    Minutes per session: Not on file   Stress: Not on file  Relationships   Social connections    Talks on phone: Not on file    Gets together: Not on file    Attends religious service: Not on file    Active member of club or organization: Not on file    Attends meetings of clubs or organizations: Not on file    Relationship status: Not on file  Other Topics Concern   Not on file  Social History Narrative   Not on file    Allergies:  No Known Allergies  Objective:    Vital Signs:   Temp:  [98.1 F (36.7 C)] 98.1 F (36.7 C) (12/01 1421) Pulse Rate:  [67-79] 78 (12/02 1342) Resp:  [14-20] 14 (12/02 0600) BP: (117-158)/(49-107) 138/92 (12/02 1341) SpO2:  [93 %-100 %] 95 % (12/02 1342) Weight:  [136.1 kg] 136.1 kg (12/01 1622)    Weight change: Filed Weights   05/21/19 1622  Weight: 136.1 kg    Intake/Output:   Intake/Output Summary (Last 24 hours) at 05/22/2019 1354 Last data filed at 05/22/2019 0735 Gross per 24 hour  Intake --  Output 1640 ml  Net -1640 ml      Physical Exam    General: Severely obese/massive anasarca, African-American male no resp difficulty, poor hygiene  HEENT: normal Neck: supple.  Elevated JVP . Carotids 2+ bilat; no bruits. No lymphadenopathy or thyromegaly appreciated. Cor: PMI nondisplaced. Regular rate &  rhythm. No rubs, gallops or murmurs. Lungs: Decreased breath sounds at the bases bilaterally Abdomen: Anasarca/abdominal edema no hepatosplenomegaly. No bruits or masses. Good bowel sounds. GU: Massive scrotal edema Extremities: no cyanosis, clubbing, rash, 2+ bilateral lower extremity edema with bilateral chronic venous stasis dermatitis Neuro: alert & orientedx3, cranial nerves grossly intact. moves all 4 extremities w/o difficulty. Affect pleasant   Telemetry   Normal sinus rhythm  EKG    ?  Possible atrial flutter with controlled ventricular  response. Will review 12 lead w/ MD.   Labs   Basic Metabolic Panel: Recent Labs  Lab 05/21/19 1500 05/22/19 0301  NA 143 141  K 4.6 4.5  CL 106 108  CO2 23 21*  GLUCOSE 112* 91  BUN 94* 95*  CREATININE 4.95* 4.79*  CALCIUM 8.4* 8.3*    Liver Function Tests: Recent Labs  Lab 05/21/19 1651  AST 20  ALT 16  ALKPHOS 79  BILITOT 2.0*  PROT 7.3  ALBUMIN 2.8*   No results for input(s): LIPASE, AMYLASE in the last 168 hours. No results for input(s): AMMONIA in the last 168 hours.  CBC: Recent Labs  Lab 05/21/19 1500  WBC 5.0  HGB 11.5*  HCT 37.1*  MCV 96.6  PLT 181    Cardiac Enzymes: No results for input(s): CKTOTAL, CKMB, CKMBINDEX, TROPONINI in the last 168 hours.  BNP: BNP (last 3 results) Recent Labs    08/29/18 1229 05/21/19 1651  BNP 2,101.9* 1,296.3*    ProBNP (last 3 results) No results for input(s): PROBNP in the last 8760 hours.   CBG: Recent Labs  Lab 05/22/19 0753  GLUCAP 84    Coagulation Studies: No results for input(s): LABPROT, INR in the last 72 hours.   Imaging   Dg Chest 2 View  Result Date: 05/22/2019 CLINICAL DATA:  Congestive heart failure. EXAM: CHEST - 2 VIEW COMPARISON:  05/21/2019 FINDINGS: Very low lung volumes limit evaluation. Lordotic positioning also noted. Cardiomegaly remains stable. Both lungs are grossly clear. IMPRESSION: Very low lung volumes limit evaluation. Stable cardiomegaly. No active lung disease. Electronically Signed   By: Marlaine Hind M.D.   On: 05/22/2019 08:55   Dg Chest 2 View  Result Date: 05/21/2019 CLINICAL DATA:  43 year old male with history of increasing edema and weight gain. History of congestive heart failure. EXAM: CHEST - 2 VIEW COMPARISON:  Chest x-ray 08/29/2018. FINDINGS: Lung volumes are low. No consolidative airspace disease. No pleural effusions. Cephalization of the pulmonary vasculature with mild indistinctness of interstitial markings and moderate to severe cardiomegaly.  Upper mediastinal contours are within normal limits. IMPRESSION: 1. The appearance the chest suggests mild congestive heart failure. Electronically Signed   By: Vinnie Langton M.D.   On: 05/21/2019 18:09      Medications:     Current Medications:  amiodarone  200 mg Oral BID   apixaban  5 mg Oral BID   atorvastatin  10 mg Oral Daily   carvedilol  25 mg Oral BID WC   ferrous sulfate  325 mg Oral BID   furosemide  60 mg Intravenous TID   insulin aspart  0-20 Units Subcutaneous TID WC   insulin glargine  1 Units Subcutaneous QHS   isosorbide-hydrALAZINE  1 tablet Oral BID   potassium chloride  10 mEq Oral Daily   sodium chloride flush  3 mL Intravenous Q12H     Infusions:  sodium chloride         Patient Profile   Joshua Massey is  a 43 y.o. male with a history of DM, HTN, and chronic systolic heart failure (diagnosed in 2010 EF 30-35%), CKD, super morbid obesity, poor compliance and poor health literacy/insight, presenting to the ED with acute on chronic CHF with massive volume overload/anasarca in the setting of poor compliance with home diuretics and dietary indiscretion with sodium.  Also found to have worsening renal function with creatinine of 4.9.  Assessment/Plan   1. Acute on chronic biventricular CHF -Echo6/04/2018 15-20%, Grade 2 DD, Mild MR, Severe LAE, RV severely dilated and reduced function, Mod RAE, Mild TR, PA peak pressure 42 mm Hg. - Last LHC 01/2012 with moderate tubular mid-LAD lesion, otherwise no significant obstructive CAD. -Now massively volume overloaded/with anasarca in the setting of poor compliance with home diuretics and dietary indiscretion with sodium.  Also with worsening renal function with creatinine 4.95.  Previous baseline in the 3 range.  He has received doses of IV Lasix in the ED and is making urine, however with his worsening renal function and degree of volume overload question if he may need CRRT to help with volume  removal.   -Currently on IV Lasix 60 mg 3 times daily.  May need to change to Lasix gtt. Monitor urinary response closely -Continue BiDil for afterload reduction along with carvedilol. -Not a candidate for ARB/ARNi, spironolactone or digoxin given CKD -Not a candidate for advanced heart failure therapies given super morbid obesity, CKD and poor compliance -Post discharge, he would benefit from HF paramedicine  - Also given h/o snoring, obesity and atrial flutter, recommend outpatient sleep study to r/o OSA  2. Atrial Flutter: ? Recurrent aflutter on EKG. Will review w/ MD. HR currently controlled.  Continue beta-blocker.  Was previous prescribed to be on amiodarone and Eliquis but has a history of poor compliance w/ medications. - monitor on tele.   3. CKD V - Baseline Creatinine ~ mid 3 range.  Now up to 4.95 - Has been noncompliant with nephrology follow-up  - Electrolytes stable and he still making urine. - Continue IV diuretics.  Question possible need for intermittent CRRT to help with volume removal - Daily BMPs w/ IV lasix   4. HTN - Elevated in setting of massive volume overload. Poor compliance with home medications.  Reports he ran out of BiDil. - Resume BiDil and carvedilol on admit. Monitor closely   5. DM2  -Per primary team   Medication concerns reviewed with patient and pharmacy team. Barriers identified: Poor medication compliance.  Would benefit from heart failure paramedicine.   Length of Stay: 1  Kemani Demarais, PA-C  05/22/2019, 1:54 PM  Advanced Heart Failure Team Pager [phone] (M-F; 7a - 4p)  Please contact [facility] for night-coverage after hours (4p -7a ) and weekends on [email or web address]  Patient seen with PA, agree with the above note.   He was admitted with marked volume overload after running out of his home meds.  He has been started on IV Lasix and is diuresing well.  Creatinine up to 4.95 at admission from baseline in the 3's, now 4.79.     General: NAD Neck: JVP 16 cm, no thyromegaly or thyroid nodule.  Lungs: Crackles at bases.  HK:[id number] PMI.  Heart regular S1/S2, no S3/S4, no murmur.  2+ edema to thighs.  No carotid bruit.  Unable to palpate pedal pulses.  Abdomen: Soft, nontender, no hepatosplenomegaly, mild-moderate distention.  Skin: Intact without lesions or rashes.  Neurologic: Alert and oriented x 3.  Psych: Normal affect. Extremities: No  clubbing or cyanosis.  HEENT: Normal.   1. Acute on chronic systolic CHF: Echo in 1/22 showed EF 20-25% with mildly dilated LV, normal RV.  Suspected nonischemic cardiomyopathy, has had low EF known since 2013, at that time had cath with nonobstructive CAD.  He ran out of his meds at home (torsemide in particular) and had significant dietary indiscretion around Thanksgiving.  Now with marked volume overload.  Creatinine elevated at 4.95 from baseline in the 3's at admission, now down to 4.79.  - With marked volume overload, will give Lasix 60 mg IV bolus then start Lasix gtt at 12 mg/hr.  - Continue Coreg.  - Restart Bidil 1 tab tid.  - No ACEI/ARNI/ARB/spironolactone with CKD stage IV.  2. Atrial flutter: Paroxysmal.  He is in NSR today on amiodarone.  - Continue amiodarone, can decrease to 200 mg daily.  - Continue apixaban.  3. AKI on CKD stage IV: Suspect cardiorenal effect with marked volume overload.  Hopefully diuresis with lowering of renal venous pressure will help.  4. Type II diabetes: Poor control.   Loralie Champagne 05/22/2019 5:06 PM

## 2019-05-23 DIAGNOSIS — I5023 Acute on chronic systolic (congestive) heart failure: Secondary | ICD-10-CM | POA: Diagnosis not present

## 2019-05-23 DIAGNOSIS — I509 Heart failure, unspecified: Secondary | ICD-10-CM

## 2019-05-23 LAB — GLUCOSE, CAPILLARY
Glucose-Capillary: 108 mg/dL — ABNORMAL HIGH (ref 70–99)
Glucose-Capillary: 123 mg/dL — ABNORMAL HIGH (ref 70–99)
Glucose-Capillary: 111 mg/dL — ABNORMAL HIGH (ref 70–99)
Glucose-Capillary: 131 mg/dL — ABNORMAL HIGH (ref 70–99)

## 2019-05-23 LAB — RENAL FUNCTION PANEL
Albumin: 2.6 g/dL — ABNORMAL LOW (ref 3.5–5.0)
Anion gap: 11 (ref 5–15)
BUN: 92 mg/dL — ABNORMAL HIGH (ref 6–20)
CO2: 21 mmol/L — ABNORMAL LOW (ref 22–32)
Calcium: 8.5 mg/dL — ABNORMAL LOW (ref 8.9–10.3)
Chloride: 110 mmol/L (ref 98–111)
Creatinine, Ser: 4.7 mg/dL — ABNORMAL HIGH (ref 0.61–1.24)
GFR calc Af Amer: 16 mL/min — ABNORMAL LOW (ref >60)
GFR calc non Af Amer: 14 mL/min — ABNORMAL LOW (ref >60)
Glucose, Bld: 111 mg/dL — ABNORMAL HIGH (ref 70–99)
Phosphorus: 4.9 mg/dL — ABNORMAL HIGH (ref 2.5–4.6)
Potassium: 4.2 mmol/L (ref 3.5–5.1)
Sodium: 142 mmol/L (ref 135–145)

## 2019-05-23 LAB — BRAIN NATRIURETIC PEPTIDE: B Natriuretic Peptide: 1739.4 pg/mL — ABNORMAL HIGH (ref 0.0–100.0)

## 2019-05-23 LAB — HEMOGLOBIN A1C
Hgb A1c MFr Bld: 6.8 % — ABNORMAL HIGH (ref 4.8–5.6)
Mean Plasma Glucose: 148 mg/dL

## 2019-05-23 MED ORDER — ISOSORB DINITRATE-HYDRALAZINE 20-37.5 MG PO TABS
1.0000 | ORAL_TABLET | Freq: Three times a day (TID) | ORAL | Status: DC
  Administered 2019-05-23 – 2019-05-27 (×12): 1 via ORAL
  Filled 2019-05-23 (×12): qty 1

## 2019-05-23 NOTE — Plan of Care (Signed)
  Problem: Education: Goal: Knowledge of General Education information will improve Description: Including pain rating scale, medication(s)/side effects and non-pharmacologic comfort measures Outcome: Not Progressing   

## 2019-05-23 NOTE — Progress Notes (Signed)
Patient ID: Joshua Massey, male   DOB: Jun 06, 1976, 43 y.o.   MRN: [id number] S: feeling better today O:BP 124/80 (BP Location: Left Arm)   Pulse 70   Temp 97.9 F (36.6 C) (Oral)   Resp 18   Ht 5\' 11"  (1.803 m)   Wt (!) 163.7 kg   SpO2 100%   BMI 50.33 kg/m   Intake/Output Summary (Last 24 hours) at 05/23/2019 1317 Last data filed at 05/23/2019 1100 Gross per 24 hour  Intake 260.21 ml  Output 2975 ml  Net -2714.79 ml   Intake/Output: I/O last 3 completed shifts: In: 260.2 [P.O.:200; I.V.:60.2] Out: 4315 [Urine:4315]  Intake/Output this shift:  Total I/O In: -  Out: 300 [Urine:300] Weight change: 28.5 kg Gen: obese AAM in NAD CVS: no rub Resp: decreased BS at bases Abd: obese, +BS, soft, +3 dependent edema on lower abd/back Ext: wrappings to both legs, +edema  Recent Labs  Lab 05/21/19 1500 05/21/19 1651 05/22/19 0301 05/23/19 0339  NA 143  --  141 142  K 4.6  --  4.5 4.2  CL 106  --  108 110  CO2 23  --  21* 21*  GLUCOSE 112*  --  91 111*  BUN 94*  --  95* 92*  CREATININE 4.95*  --  4.79* 4.70*  ALBUMIN  --  2.8*  --  2.6*  CALCIUM 8.4*  --  8.3* 8.5*  PHOS  --   --   --  4.9*  AST  --  20  --   --   ALT  --  16  --   --    Liver Function Tests: Recent Labs  Lab 05/21/19 1651 05/23/19 0339  AST 20  --   ALT 16  --   ALKPHOS 79  --   BILITOT 2.0*  --   PROT 7.3  --   ALBUMIN 2.8* 2.6*   No results for input(s): LIPASE, AMYLASE in the last 168 hours. No results for input(s): AMMONIA in the last 168 hours. CBC: Recent Labs  Lab 05/21/19 1500  WBC 5.0  HGB 11.5*  HCT 37.1*  MCV 96.6  PLT 181   Cardiac Enzymes: No results for input(s): CKTOTAL, CKMB, CKMBINDEX, TROPONINI in the last 168 hours. CBG: Recent Labs  Lab 05/22/19 0753 05/22/19 1628 05/22/19 2122 05/23/19 0556 05/23/19 1113  GLUCAP 84 112* 107* 108* 123*    Iron Studies: No results for input(s): IRON, TIBC, TRANSFERRIN, FERRITIN in the last 72 hours. Studies/Results: Dg  Chest 2 View  Result Date: 05/22/2019 CLINICAL DATA:  Congestive heart failure. EXAM: CHEST - 2 VIEW COMPARISON:  05/21/2019 FINDINGS: Very low lung volumes limit evaluation. Lordotic positioning also noted. Cardiomegaly remains stable. Both lungs are grossly clear. IMPRESSION: Very low lung volumes limit evaluation. Stable cardiomegaly. No active lung disease. Electronically Signed   By: Marlaine Hind M.D.   On: 05/22/2019 08:55   Dg Chest 2 View  Result Date: 05/21/2019 CLINICAL DATA:  43 year old male with history of increasing edema and weight gain. History of congestive heart failure. EXAM: CHEST - 2 VIEW COMPARISON:  Chest x-ray 08/29/2018. FINDINGS: Lung volumes are low. No consolidative airspace disease. No pleural effusions. Cephalization of the pulmonary vasculature with mild indistinctness of interstitial markings and moderate to severe cardiomegaly. Upper mediastinal contours are within normal limits. IMPRESSION: 1. The appearance the chest suggests mild congestive heart failure. Electronically Signed   By: Vinnie Langton M.D.   On: 05/21/2019 18:09   .  amiodarone  200 mg Oral Daily  . apixaban  5 mg Oral BID  . atorvastatin  10 mg Oral Daily  . carvedilol  25 mg Oral BID WC  . ferrous sulfate  325 mg Oral BID  . influenza vac split quadrivalent PF  0.5 mL Intramuscular Tomorrow-1000  . insulin aspart  0-20 Units Subcutaneous TID WC  . insulin glargine  1 Units Subcutaneous QHS  . isosorbide-hydrALAZINE  1 tablet Oral TID  . potassium chloride  10 mEq Oral Daily  . sodium chloride flush  3 mL Intravenous Q12H    BMET    Component Value Date/Time   NA 142 05/23/2019 0339   NA 142 09/12/2016   K 4.2 05/23/2019 0339   CL 110 05/23/2019 0339   CO2 21 (L) 05/23/2019 0339   GLUCOSE 111 (H) 05/23/2019 0339   BUN 92 (H) 05/23/2019 0339   BUN 58 (A) 09/12/2016   CREATININE 4.70 (H) 05/23/2019 0339   CREATININE 2.63 (H) 12/20/2016 1613   CALCIUM 8.5 (L) 05/23/2019 0339    GFRNONAA 14 (L) 05/23/2019 0339   GFRAA 16 (L) 05/23/2019 0339   CBC    Component Value Date/Time   WBC 5.0 05/21/2019 1500   RBC 3.84 (L) 05/21/2019 1500   HGB 11.5 (L) 05/21/2019 1500   HCT 37.1 (L) 05/21/2019 1500   PLT 181 05/21/2019 1500   MCV 96.6 05/21/2019 1500   MCH 29.9 05/21/2019 1500   MCHC 31.0 05/21/2019 1500   RDW 17.4 (H) 05/21/2019 1500   LYMPHSABS 0.5 (L) 11/27/2017 1422   MONOABS 0.7 11/27/2017 1422   EOSABS 0.1 11/27/2017 1422   BASOSABS 0.0 11/27/2017 1422    Assessment/Plan: 1.  AKI/CKD stage 4- presumably due to cardiorenal syndrome.  He is well known to our service and has been educated on the severity and chronicity of his kidney disease, however he continues to cancel outpatient follow up.  He acts surprised to hear about his kidney function despite recent admission in March with similar presentation and discussion.   1. BUN/Cr stable to slightly improved despite 3 liter diuresis overnight 2. Continue with IV diuresis and follow renal function.   3. I discussed the eventual need for dialysis if he would be compliant.  No uremic symptoms at this time and will follow UOP and Scr for now.   4. Recommend AVF creation during this hospitalization given his poor compliance with follow up and progressive CKD 2. Acute on chronic diastolic and systolic CHF- due to noncompliance with medications and follow up.  Heart failure is following and currently on IV lasix 60 mg tid with UF of 2.7 liters overnight.  Continue with that dose and frequency for now. 3. Atrial flutter- per cardiology.  Currently rate controlled with beta-blocker 4. DM- poorly controlled 5. HTN- stable 6. Morbid obesity 7. Anemia of CKD- stable 8. Medical noncompliance- ongoing issue.  Donetta Potts, MD [employer] [phone]

## 2019-05-23 NOTE — Progress Notes (Addendum)
Advanced Heart Failure Rounding Note  PCP-Cardiologist: No primary care provider on file.   Subjective:    Yesterday started on lasix drip. Brisk diuresis noted. Negative 4 liters. Weight down 2 but this was a bed weight.   Feeling a little better today. Denies SOB.   Objective:   Weight Range: (!) 163.7 kg Body mass index is 50.33 kg/m.   Vital Signs:   Temp:  [96.1 F (35.6 C)-98 F (36.7 C)] 97.6 F (36.4 C) (12/03 0500) Pulse Rate:  [65-82] 73 (12/03 0500) Resp:  [20-23] 23 (12/03 0500) BP: (117-152)/(76-118) 139/92 (12/03 0500) SpO2:  [93 %-99 %] 99 % (12/03 0500) Weight:  [163.7 kg-164.6 kg] 163.7 kg (12/03 0500) Last BM Date: 05/22/19  Weight change: Filed Weights   05/21/19 1622 05/22/19 1630 05/23/19 0500  Weight: 136.1 kg (!) 164.6 kg (!) 163.7 kg    Intake/Output:   Intake/Output Summary (Last 24 hours) at 05/23/2019 0726 Last data filed at 05/23/2019 0500 Gross per 24 hour  Intake 260.21 ml  Output 4315 ml  Net -4054.79 ml      Physical Exam    General:  Well appearing. No resp difficulty HEENT: Normal Neck: Supple. JVP to jaw Carotids 2+ bilat; no bruits. No lymphadenopathy or thyromegaly appreciated. Cor: PMI nondisplaced. Regular rate & rhythm. No rubs, gallops or murmurs. Lungs: Clear Abdomen: obese, Soft, nontender, nondistended. No hepatosplenomegaly. No bruits or masses. Good bowel sounds. Extremities: No cyanosis, clubbing, rash,  R and LLE 3+ edema. RUE/LUE excoriated Neuro: Alert & orientedx3, cranial nerves grossly intact. moves all 4 extremities w/o difficulty. Affect pleasant   Telemetry   SR 60-80s personally reviewed.   EKG    N/a   Labs    CBC Recent Labs    05/21/19 1500  WBC 5.0  HGB 11.5*  HCT 37.1*  MCV 96.6  PLT 0000000   Basic Metabolic Panel Recent Labs    05/22/19 0301 05/23/19 0339  NA 141 142  K 4.5 4.2  CL 108 110  CO2 21* 21*  GLUCOSE 91 111*  BUN 95* 92*  CREATININE 4.79* 4.70*  CALCIUM  8.3* 8.5*  PHOS  --  4.9*   Liver Function Tests Recent Labs    05/21/19 1651 05/23/19 0339  AST 20  --   ALT 16  --   ALKPHOS 79  --   BILITOT 2.0*  --   PROT 7.3  --   ALBUMIN 2.8* 2.6*   No results for input(s): LIPASE, AMYLASE in the last 72 hours. Cardiac Enzymes No results for input(s): CKTOTAL, CKMB, CKMBINDEX, TROPONINI in the last 72 hours.  BNP: BNP (last 3 results) Recent Labs    08/29/18 1229 05/21/19 1651 05/23/19 0339  BNP 2,101.9* 1,296.3* 1,739.4*    ProBNP (last 3 results) No results for input(s): PROBNP in the last 8760 hours.   D-Dimer No results for input(s): DDIMER in the last 72 hours. Hemoglobin A1C Recent Labs    05/21/19 2311  HGBA1C 6.8*   Fasting Lipid Panel No results for input(s): CHOL, HDL, LDLCALC, TRIG, CHOLHDL, LDLDIRECT in the last 72 hours. Thyroid Function Tests No results for input(s): TSH, T4TOTAL, T3FREE, THYROIDAB in the last 72 hours.  Invalid input(s): FREET3  Other results:   Imaging     No results found.   Medications:     Scheduled Medications: . amiodarone  200 mg Oral Daily  . apixaban  5 mg Oral BID  . atorvastatin  10 mg Oral Daily  .  carvedilol  25 mg Oral BID WC  . ferrous sulfate  325 mg Oral BID  . influenza vac split quadrivalent PF  0.5 mL Intramuscular Tomorrow-1000  . insulin aspart  0-20 Units Subcutaneous TID WC  . insulin glargine  1 Units Subcutaneous QHS  . isosorbide-hydrALAZINE  1 tablet Oral BID  . potassium chloride  10 mEq Oral Daily  . sodium chloride flush  3 mL Intravenous Q12H     Infusions: . sodium chloride    . furosemide (LASIX) infusion 12 mg/hr (05/22/19 2259)     PRN Medications:  sodium chloride, acetaminophen, gabapentin, ondansetron (ZOFRAN) IV, oxyCODONE-acetaminophen, silver sulfADIAZINE, sodium chloride flush     Assessment/Plan  1. Acute on chronic systolic CHF: Echo in 0000000 showed EF 20-25% with mildly dilated LV, normal RV.  Suspected  nonischemic cardiomyopathy, has had low EF known since 2013, at that time had cath with nonobstructive CAD.  He ran out of his meds at home (torsemide in particular) and had significant dietary indiscretion around Thanksgiving.  Now with marked volume overload.  Creatinine elevated at 4.95 from baseline in the 3's at admission, now down to 4.7  -Marked volume overload. Diuresis picking up. Continue  Lasix gtt at 12 mg/hr. No PICC due to possible need for HD down the road.  - Continue Coreg.  - Continue Bidil 1 tab tid.  - No ACEI/ARNI/ARB/spironolactone with CKD stage IV.  2. Atrial flutter: Paroxysmal.  Maintaining NSR. Continue 200 mg amiodarone daily. .  - Continue apixaban.  3. AKI on CKD stage IV: Baseline creatinine 3s  Suspect cardiorenal effect with marked volume overload.  Hopefully diuresis with lowering of renal venous pressure will help. Nephrology following.   Creatinine unchanged 4.7. 4. Type II diabetes: Poor control.   Medication concerns reviewed with patient and pharmacy team. Barriers identified: yes noncompliance. Lives in [place]. Will need to refer to HF Paramedicine. I have messaged HF SW to start referral .   Length of Stay: 2  Darrick Grinder, NP  05/23/2019, 7:26 AM  Advanced Heart Failure Team Pager [phone] (M-F; 7a - 4p)  Please contact [facility] for night-coverage after hours (4p -7a ) and weekends on [email or web address]  Patient seen with NP, agree with the above note.    Good diuresis so far with Lasix gtt, creatinine 4.79 => 4.7.    On exam, he remains markedly volume overloaded.   Continue Lasix gtt at 12 mg/hr.  Continue Bidil and Coreg.  Compliance with home regimen is going to be key.   Loralie Champagne 05/23/2019 8:59 AM

## 2019-05-23 NOTE — Progress Notes (Signed)
PROGRESS NOTE    Patient: Joshua Massey                            PCP: [name], [name], MD                    DOB: 05/12/1976            DOA: 05/21/2019 [id number]             DOS: 05/23/2019, 2:32 PM   LOS: 2 days   Date of Service: The patient was seen and examined on 05/23/2019  Subjective:   The patient was seen and examined this morning, remained stable awake alert, on room air.  On Lasix drip. Cooperative, no complaints overnight.  Brief Narrative:   Joshua Massey is a 43 y.o. 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 fib on Eliquis,and combined systolic and diastolic CHF who presents w SOB, edema after not taking his diuretic for 2 days.  Patient reports he ran out of his torsemide 2 days ago. He was taking 60 mg daily.He was recently prescribe a higher dose of 100 mg daily which he has not picked up.He is followed in the heart failure clinic with last virtual visit with Dr. Sung Amabile in June '20. His last echo  March 12,2020 revealed EF of  20 to 123456 with diastolic dysfunction.  He eports he ran out of batteries for his scale at homeand is not sure how much weight gain he has had recently.   ED Course:  Hemodynamically stable, Lab revealed K 3.2, BNP 1,296, CXR with mild pulmonary edema. Exam revealed increased peripheral edema.  Admitted for acute on chronic systolic CHF.   Assessment & Plan:   Active Problems:   Hypertension   Obesity   Acute on chronic systolic CHF (congestive heart failure) (HCC)   Type 2 diabetes mellitus with vascular disease (HCC)   CKD (chronic kidney disease), stage IV (HCC)   Acute on chronic systolic heart failure (HCC)    Acute on chronic s&d CHF w EJF 20-25% -Remains symptomatic with shortness of breath, edema, pulmonary edema -Monitoring I's and O's, daily weight,--- > 4 L out  -Patient has been switched to IV  Lasix drips, -Appreciate heart team following and managing  -Continue home meds with exception of ACE/ ARBs due to CKD - March 12,2020 revealed EF of  20 to 123456 with systolic/diastolic dysfunction.   Poorly controlled diabetes mellitus type 2 -Last A1c December 2019,  8.5% -Repeating A1c -Checking CBG QA CHS, SSI -  Continue Lantus,    Adamantly encouraged to have PCP schedule opthal exam   CKD IV  -Creatinine 4.95  >>>  4.79>>> 4.7  (baseline around 3.6) -Worsening creatinine compared to previous settings -Monitor BUN/creatinine closely, trying to avoid nephrotoxins -Not on any ACE inhibitor's or ARBs due to CKD -Nephrology consulted, appreciate follow-up in their input.  History of hypertension -We will continue current meds including Coreg, diuretics, -Titrating meds for better blood pressure control  History of A. Fib -  Continue Eliquis, beta-blocker, amiodarone    DVT prophylaxis: eliquis Code Status: full code  Family Communication:  None present at bedside Disposition Plan: home 2-3 days Consults called: Heart failure team consult put in  Nephrology [place], cardiology Dr. Aundra Dubin Admission status: inpatient tele -based inpatient criteria due to symptomatic acute on chronic  CHF requiring IV aggressive diuretics evaluation from heart team    Procedures:   No admission procedures for hospital encounter.    Antimicrobials:  Anti-infectives (From admission, onward)   None       Medication:  . amiodarone  200 mg Oral Daily  . apixaban  5 mg Oral BID  . atorvastatin  10 mg Oral Daily  . carvedilol  25 mg Oral BID WC  . ferrous sulfate  325 mg Oral BID  . influenza vac split quadrivalent PF  0.5 mL Intramuscular Tomorrow-1000  . insulin aspart  0-20 Units Subcutaneous TID WC  . insulin glargine  1 Units Subcutaneous QHS  . isosorbide-hydrALAZINE  1 tablet Oral TID  . potassium chloride  10 mEq Oral Daily  . sodium chloride flush  3 mL Intravenous  Q12H    sodium chloride, acetaminophen, gabapentin, ondansetron (ZOFRAN) IV, oxyCODONE-acetaminophen, silver sulfADIAZINE, sodium chloride flush   Objective:   Vitals:   05/22/19 1630 05/22/19 2100 05/23/19 0500 05/23/19 0758  BP:  (!) 132/95 (!) 139/92 124/80  Pulse:  65 73 70  Resp:  (!) 22 (!) 23 18  Temp:  98 F (36.7 C) 97.6 F (36.4 C) 97.9 F (36.6 C)  TempSrc:  Oral Oral Oral  SpO2:  94% 99% 100%  Weight: (!) 164.6 kg  (!) 163.7 kg   Height:        Intake/Output Summary (Last 24 hours) at 05/23/2019 1432 Last data filed at 05/23/2019 1320 Gross per 24 hour  Intake 380.21 ml  Output 2425 ml  Net -2044.79 ml   Filed Weights   05/21/19 1622 05/22/19 1630 05/23/19 0500  Weight: 136.1 kg (!) 164.6 kg (!) 163.7 kg     Examination:  BP 124/80 (BP Location: Left Arm)   Pulse 70   Temp 97.9 F (36.6 C) (Oral)   Resp 18   Ht 5\' 11"  (1.803 m)   Wt (!) 163.7 kg   SpO2 100%   BMI 50.33 kg/m    Physical Exam  Constitution:  Alert, cooperative, no distress,  Psychiatric: Normal and stable mood and affect, cognition intact,   HEENT: Normocephalic, PERRL, otherwise with in Normal limits  Chest:Chest symmetric Cardio vascular:  S1/S2, RRR, No murmure, No Rubs or Gallops  pulmonary: Clear to auscultation bilaterally, respirations unlabored, negative wheezes / crackles Abdomen: Soft, non-tender, non-distended, bowel sounds,no masses, no organomegaly Muscular skeletal: Limited exam - in bed, able to move all 4 extremities, Normal strength,  Neuro: CNII-XII intact. , normal motor and sensation, reflexes intact  Extremities: ++  pitting edema lower extremities, +2 pulses  Skin: Dry, warm to touch, negative for any Rashes, No open wounds Wounds: per nursing documentation         LABs:  CBC Latest Ref Rng & Units 05/21/2019 09/05/2018 09/04/2018  WBC 4.0 - 10.5 K/uL 5.0 5.3 4.8  Hemoglobin 13.0 - 17.0 g/dL 11.5(L) 11.8(L) 11.5(L)  Hematocrit 39.0 - 52.0 % 37.1(L)  36.6(L) 37.6(L)  Platelets 150 - 400 K/uL 181 262 230   CMP Latest Ref Rng & Units 05/23/2019 05/22/2019 05/21/2019  Glucose 70 - 99 mg/dL 111(H) 91 112(H)  BUN 6 - 20 mg/dL 92(H) 95(H) 94(H)  Creatinine 0.61 - 1.24 mg/dL 4.70(H) 4.79(H) 4.95(H)  Sodium 135 - 145 mmol/L 142 141 143  Potassium 3.5 - 5.1 mmol/L 4.2 4.5 4.6  Chloride 98 - 111 mmol/L 110 108 106  CO2 22 - 32 mmol/L 21(L) 21(L) 23  Calcium 8.9 - 10.3 mg/dL 8.5(L)  8.3(L) 8.4(L)  Total Protein 6.5 - 8.1 g/dL - - 7.3  Total Bilirubin 0.3 - 1.2 mg/dL - - 2.0(H)  Alkaline Phos 38 - 126 U/L - - 79  AST 15 - 41 U/L - - 20  ALT 0 - 44 U/L - - 16        SIGNED: Deatra James, MD, FACP, FHM. [facility],  Pager [phone]  If 7PM-7AM, please contact night-coverage [email or web address] O[facility] 05/23/2019, 2:32 PM

## 2019-05-23 NOTE — Progress Notes (Signed)
Outpatient CSW consulted by Heart Failure Team to meet with patient regarding outpatient paramedicine program.  Pt was part of program last year and is agreeable to being re-enrolled.  Paramedicine Initial Assessment:  Housing:  In what kind of housing do you live? House/apt/trailer/shelter? apartment  Do you rent/pay a mortgage/own? rent  Do you live with anyone? Long term girlfriend and two children (42yo and 9yo)  Are you currently worried about losing your housing? no  Within the past 12 months have you ever stayed outside, in a car, tent, a shelter, or temporarily with someone? no  Within the past 12 months have you been unable to get utilities when it was really needed? no  Social:  What is your current marital status? single  Do you have any children? 3- 9yo, 9yo, 49 yo  Do you have family or friends who live locally? Parents and a brother- states they are good source of support.  Food:  Within the past 12 months were you ever worried that food would run out before you got money to buy more? no  Within the past 49months have you run out of food and didn't have money to buy more? no  Income:  What is your current source of income? Works for [employer] and [employer] and girlfriend works at [employer] hard is it for you to pay for the basics like food housing, medical care, and utilities? Not very  Do you have outstanding medical bills? no  Insurance:  Are you currently insured? Yes BCBS  Do you have prescription coverage? yes  If no insurance, have you applied for coverage (Medicaid, disability, marketplace etc)? N/A  Transportation:  Do you have transportation to your medical appointments? Yes has private vehicle   If yes, how?   In the past 12 months has lack of transportation kept you from medical appts or from getting medications? no  In the past 12 months has lack of transportation kept you from meetings, work, or getting things you needed? no   Daily Health  Needs: Do you have a working scale at home? Yes just needs new batteries  Do you ever take your medications differently than prescribed? Was taking lasix incorrectly- states that his PCP prescribed incorrect number of pills so he was unable to take the full dosage.  Do you have issues affording your medications? States the Bidil has been $350 which is hard to afford.  CSW informed pt of copay card that will reduce the cost significantly- CSW signed pt up for copay card and provided to patient.  Do you have any concerns with mobility at home? Sometimes uses a cane if he has fluid on him that makes it hard to walk.   Are there any additional barriers you see to getting the care you need? no  CSW will continue to follow through paramedicine program and assist as needed.  Jorge Ny, LCSW Clinical Social Worker [facility] Desk#: [phone] Cell#: [phone]

## 2019-05-24 DIAGNOSIS — I5023 Acute on chronic systolic (congestive) heart failure: Secondary | ICD-10-CM | POA: Diagnosis not present

## 2019-05-24 DIAGNOSIS — E6609 Other obesity due to excess calories: Secondary | ICD-10-CM

## 2019-05-24 LAB — RENAL FUNCTION PANEL
Albumin: 2.5 g/dL — ABNORMAL LOW (ref 3.5–5.0)
Anion gap: 14 (ref 5–15)
BUN: 91 mg/dL — ABNORMAL HIGH (ref 6–20)
CO2: 22 mmol/L (ref 22–32)
Calcium: 8.6 mg/dL — ABNORMAL LOW (ref 8.9–10.3)
Chloride: 107 mmol/L (ref 98–111)
Creatinine, Ser: 4.56 mg/dL — ABNORMAL HIGH (ref 0.61–1.24)
GFR calc Af Amer: 17 mL/min — ABNORMAL LOW (ref >60)
GFR calc non Af Amer: 15 mL/min — ABNORMAL LOW (ref >60)
Glucose, Bld: 109 mg/dL — ABNORMAL HIGH (ref 70–99)
Phosphorus: 4.4 mg/dL (ref 2.5–4.6)
Potassium: 4.1 mmol/L (ref 3.5–5.1)
Sodium: 143 mmol/L (ref 135–145)

## 2019-05-24 LAB — CBC
HCT: 34 % — ABNORMAL LOW (ref 39.0–52.0)
Hemoglobin: 11 g/dL — ABNORMAL LOW (ref 13.0–17.0)
MCH: 30.2 pg (ref 26.0–34.0)
MCHC: 32.4 g/dL (ref 30.0–36.0)
MCV: 93.4 fL (ref 80.0–100.0)
Platelets: 170 10*3/uL (ref 150–400)
RBC: 3.64 MIL/uL — ABNORMAL LOW (ref 4.22–5.81)
RDW: 17.2 % — ABNORMAL HIGH (ref 11.5–15.5)
WBC: 6 10*3/uL (ref 4.0–10.5)
nRBC: 0 % (ref 0.0–0.2)

## 2019-05-24 LAB — GLUCOSE, CAPILLARY
Glucose-Capillary: 111 mg/dL — ABNORMAL HIGH (ref 70–99)
Glucose-Capillary: 123 mg/dL — ABNORMAL HIGH (ref 70–99)
Glucose-Capillary: 117 mg/dL — ABNORMAL HIGH (ref 70–99)
Glucose-Capillary: 105 mg/dL — ABNORMAL HIGH (ref 70–99)

## 2019-05-24 LAB — BRAIN NATRIURETIC PEPTIDE: B Natriuretic Peptide: 1632.7 pg/mL — ABNORMAL HIGH (ref 0.0–100.0)

## 2019-05-24 MED ORDER — METOLAZONE 5 MG PO TABS
2.5000 mg | ORAL_TABLET | Freq: Once | ORAL | Status: AC
  Administered 2019-05-24: 12:00:00 2.5 mg via ORAL
  Filled 2019-05-24: qty 1

## 2019-05-24 NOTE — Progress Notes (Signed)
PROGRESS NOTE    Patient: Joshua Massey                            PCP: [name], [name], MD                    DOB: Nov 28, 1975            DOA: 05/21/2019 [id number]             DOS: 05/24/2019, 12:29 PM   LOS: 3 days   Date of Service: The patient was seen and examined on 05/24/2019  Subjective:   The patient was seen and examined this morning, remained stable, reporting of no chest pain, improved shortness of breath.  Not on oxygen.   Continues to be on Lasix drip at 12 mL/hr Diuresing well.   Brief Narrative:   Joshua Massey is a 43 y.o. 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 fib on Eliquis,and combined systolic and diastolic CHF who presents w SOB, edema after not taking his diuretic for 2 days.  Patient reports he ran out of his torsemide 2 days ago. He was taking 60 mg daily.He was recently prescribe a higher dose of 100 mg daily which he has not picked up.He is followed in the heart failure clinic with last virtual visit with Dr. Sung Amabile in June '20. His last echo  March 12,2020 revealed EF of  20 to 123456 with diastolic dysfunction.  He eports he ran out of batteries for his scale at homeand is not sure how much weight gain he has had recently.   ED Course:  Hemodynamically stable, Lab revealed K 3.2, BNP 1,296, CXR with mild pulmonary edema. Exam revealed increased peripheral edema.  Admitted for acute on chronic systolic CHF.   Assessment & Plan:   Active Problems:   Hypertension   Obesity   Acute on chronic systolic CHF (congestive heart failure) (HCC)   Type 2 diabetes mellitus with vascular disease (HCC)   CKD (chronic kidney disease), stage IV (HCC)   Acute on chronic systolic heart failure (HCC)   Acute exacerbation of CHF (congestive heart failure) (HCC)    Acute on chronic s&d CHF w EJF 20-25%  -Volume overload in the setting of  cardiorenal syndrome -Remained stable, appreciate heart failure team management -Patient remains on Lasix drip at 12 mL/ hour -Midazolam was added -Monitoring I's and O's, daily weight,--- > 4 L >>> 1.8 L out    -Continue home meds with exception of ACE/ ARBs due to CKD - March 12,2020 revealed EF of  20 to 123456 with systolic/diastolic dysfunction.   Poorly controlled diabetes mellitus type 2 -Last A1c December 2019,  8.5% -Repeating A1c -Checking CBG QA CHS, SSI -  Continue Lantus titrating for better glycemic control.    Adamantly encouraged to have PCP schedule opthal exam   CKD IV  -Creatinine 4.95  >>>  4.79>>> 4.7  >>> 4.56 (baseline around 3.6) -Monitoring closely,  -Monitor BUN/creatinine closely, trying to avoid nephrotoxins -Not on any ACE inhibitor's or ARBs due to CKD -Nephrology consulted, appreciate follow-up in their input.  History of hypertension -Mildly hypotensive, monitoring -We will continue current meds including Coreg, diuretics, -Titrating meds for better blood pressure control  History of A. Fib -  Continue Eliquis, beta-blocker, amiodarone    DVT prophylaxis: eliquis Code Status: full code  Family Communication:  None present at bedside Disposition Plan: home 1-2  days Consults called: Heart failure team consult put in  Ne[facility] [place], cardiology Dr. Aundra Dubin Admission status: inpatient tele -based inpatient criteria due to symptomatic acute on chronic CHF requiring IV aggressive diuretics evaluation from heart team    Procedures:   No admission procedures for hospital encounter.    Antimicrobials:  Anti-infectives (From admission, onward)   None       Medication:  . amiodarone  200 mg Oral Daily  . apixaban  5 mg Oral BID  . atorvastatin  10 mg Oral Daily  . carvedilol  25 mg Oral BID WC  . ferrous sulfate  325 mg Oral BID  . influenza vac split quadrivalent PF  0.5 mL Intramuscular Tomorrow-1000  . insulin aspart   0-20 Units Subcutaneous TID WC  . insulin glargine  1 Units Subcutaneous QHS  . isosorbide-hydrALAZINE  1 tablet Oral TID  . potassium chloride  10 mEq Oral Daily  . sodium chloride flush  3 mL Intravenous Q12H    sodium chloride, acetaminophen, gabapentin, ondansetron (ZOFRAN) IV, oxyCODONE-acetaminophen, silver sulfADIAZINE, sodium chloride flush   Objective:   Vitals:   05/23/19 1948 05/24/19 0518 05/24/19 0908 05/24/19 1124  BP: 131/84 132/88 128/88 109/73  Pulse: (!) 49 76 73 74  Resp: 19 18 14 19   Temp: 98 F (36.7 C) 98.1 F (36.7 C) 98.6 F (37 C) 97.6 F (36.4 C)  TempSrc: Oral Oral Oral Oral  SpO2: 96% 98%  96%  Weight:  (!) 162.1 kg    Height:      BMI 49.83 kg/m  Intake/Output Summary (Last 24 hours) at 05/24/2019 1229 Last data filed at 05/24/2019 1215 Gross per 24 hour  Intake 468.4 ml  Output 2850 ml  Net -2381.6 ml   Filed Weights   05/22/19 1630 05/23/19 0500 05/24/19 0518  Weight: (!) 164.6 kg (!) 163.7 kg (!) 162.1 kg     Examination:    Physical Exam  Constitution: Obese, alert, cooperative, no distress,  Psychiatric: Normal and stable mood and affect, cognition intact,   HEENT: Normocephalic, PERRL, otherwise with in Normal limits  Chest:Chest symmetric Cardio vascular:  S1/S2, RRR, No murmure, No Rubs or Gallops  pulmonary: Clear to auscultation bilaterally, respirations unlabored, negative wheezes / crackles Abdomen: Soft, non-tender, non-distended, bowel sounds,no masses, no organomegaly Muscular skeletal: Limited exam - in bed, able to move all 4 extremities, Normal strength,  Neuro: CNII-XII intact. , normal motor and sensation, reflexes intact  Extremities: ++edema lower extremities, +2 pulses  Skin: Dry, warm to touch, negative for any Rashes, No open wounds Wounds: per nursing documentation          LABs:  CBC Latest Ref Rng & Units 05/24/2019 05/21/2019 09/05/2018  WBC 4.0 - 10.5 K/uL 6.0 5.0 5.3  Hemoglobin 13.0 - 17.0 g/dL  11.0(L) 11.5(L) 11.8(L)  Hematocrit 39.0 - 52.0 % 34.0(L) 37.1(L) 36.6(L)  Platelets 150 - 400 K/uL 170 181 262   CMP Latest Ref Rng & Units 05/24/2019 05/23/2019 05/22/2019  Glucose 70 - 99 mg/dL 109(H) 111(H) 91  BUN 6 - 20 mg/dL 91(H) 92(H) 95(H)  Creatinine 0.61 - 1.24 mg/dL 4.56(H) 4.70(H) 4.79(H)  Sodium 135 - 145 mmol/L 143 142 141  Potassium 3.5 - 5.1 mmol/L 4.1 4.2 4.5  Chloride 98 - 111 mmol/L 107 110 108  CO2 22 - 32 mmol/L 22 21(L) 21(L)  Calcium 8.9 - 10.3 mg/dL 8.6(L) 8.5(L) 8.3(L)  Total Protein 6.5 -  8.1 g/dL - - -  Total Bilirubin 0.3 - 1.2 mg/dL - - -  Alkaline Phos 38 - 126 U/L - - -  AST 15 - 41 U/L - - -  ALT 0 - 44 U/L - - -        SIGNED: Deatra James, MD, FACP, FHM. [facility],  Pager [phone]  If 7PM-7AM, please contact night-coverage [email or web address] 05/24/2019, 12:29 PM

## 2019-05-24 NOTE — Progress Notes (Addendum)
Advanced Heart Failure Rounding Note  PCP-Cardiologist: No primary care provider on file.   Subjective:    Remains on lasix drip. Weight down another 3 pounds.   Creatinine trending down  4.7>4.56   Denies SOB.   Objective:   Weight Range: (!) 162.1 kg Body mass index is 49.83 kg/m.   Vital Signs:   Temp:  [97.7 F (36.5 C)-98.6 F (37 C)] 98.6 F (37 C) (12/04 0908) Pulse Rate:  [49-76] 73 (12/04 0908) Resp:  [14-19] 14 (12/04 0908) BP: (128-133)/(84-98) 128/88 (12/04 0908) SpO2:  [96 %-99 %] 98 % (12/04 0518) Weight:  [162.1 kg] 162.1 kg (12/04 0518) Last BM Date: 05/22/19  Weight change: Filed Weights   05/22/19 1630 05/23/19 0500 05/24/19 0518  Weight: (!) 164.6 kg (!) 163.7 kg (!) 162.1 kg    Intake/Output:   Intake/Output Summary (Last 24 hours) at 05/24/2019 1103 Last data filed at 05/24/2019 0900 Gross per 24 hour  Intake 468.4 ml  Output 2500 ml  Net -2031.6 ml      Physical Exam    General:  Well appearing. No resp difficulty HEENT: normal Neck: supple. no JVD. Carotids 2+ bilat; no bruits. No lymphadenopathy or thryomegaly appreciated. Cor: PMI nondisplaced. Regular rate & rhythm. No rubs, gallops or murmurs. Lungs: clear Abdomen: obese, soft, nontender, nondistended. No hepatosplenomegaly. No bruits or masses. Good bowel sounds. Extremities: no cyanosis, clubbing, rash, R and LLE 3+ edema Neuro: alert & orientedx3, cranial nerves grossly intact. moves all 4 extremities w/o difficulty. Affect pleasant   Telemetry   SR 70-80s personally reviewed.   EKG    N/a   Labs    CBC Recent Labs    05/21/19 1500 05/24/19 0246  WBC 5.0 6.0  HGB 11.5* 11.0*  HCT 37.1* 34.0*  MCV 96.6 93.4  PLT 181 [id number]   Basic Metabolic Panel Recent Labs    05/23/19 0339 05/24/19 0246  NA 142 143  K 4.2 4.1  CL 110 107  CO2 21* 22  GLUCOSE 111* 109*  BUN 92* 91*  CREATININE 4.70* 4.56*  CALCIUM 8.5* 8.6*  PHOS 4.9* 4.4   Liver Function  Tests Recent Labs    05/21/19 1651 05/23/19 0339 05/24/19 0246  AST 20  --   --   ALT 16  --   --   ALKPHOS 79  --   --   BILITOT 2.0*  --   --   PROT 7.3  --   --   ALBUMIN 2.8* 2.6* 2.5*   No results for input(s): LIPASE, AMYLASE in the last 72 hours. Cardiac Enzymes No results for input(s): CKTOTAL, CKMB, CKMBINDEX, TROPONINI in the last 72 hours.  BNP: BNP (last 3 results) Recent Labs    05/21/19 1651 05/23/19 0339 05/24/19 0246  BNP 1,296.3* 1,739.4* 1,632.7*    ProBNP (last 3 results) No results for input(s): PROBNP in the last 8760 hours.   D-Dimer No results for input(s): DDIMER in the last 72 hours. Hemoglobin A1C Recent Labs    05/21/19 2311  HGBA1C 6.8*   Fasting Lipid Panel No results for input(s): CHOL, HDL, LDLCALC, TRIG, CHOLHDL, LDLDIRECT in the last 72 hours. Thyroid Function Tests No results for input(s): TSH, T4TOTAL, T3FREE, THYROIDAB in the last 72 hours.  Invalid input(s): FREET3  Other results:   Imaging    No results found.   Medications:     Scheduled Medications: . amiodarone  200 mg Oral Daily  . apixaban  5 mg Oral  BID  . atorvastatin  10 mg Oral Daily  . carvedilol  25 mg Oral BID WC  . ferrous sulfate  325 mg Oral BID  . influenza vac split quadrivalent PF  0.5 mL Intramuscular Tomorrow-1000  . insulin aspart  0-20 Units Subcutaneous TID WC  . insulin glargine  1 Units Subcutaneous QHS  . isosorbide-hydrALAZINE  1 tablet Oral TID  . potassium chloride  10 mEq Oral Daily  . sodium chloride flush  3 mL Intravenous Q12H    Infusions: . sodium chloride    . furosemide (LASIX) infusion 12 mg/hr (05/23/19 2305)    PRN Medications: sodium chloride, acetaminophen, gabapentin, ondansetron (ZOFRAN) IV, oxyCODONE-acetaminophen, silver sulfADIAZINE, sodium chloride flush     Assessment/Plan  1. Acute on chronic systolic CHF: Echo in 0000000 showed EF 20-25% with mildly dilated LV, normal RV.  Suspected nonischemic  cardiomyopathy, has had low EF known since 2013, at that time had cath with nonobstructive CAD.  He ran out of his meds at home (torsemide in particular) and had significant dietary indiscretion around Thanksgiving.  Creatinine elevated at 4.95 from baseline in the 3's at admission, now down to 4.56   - Volume status improving. Continue  Lasix gtt at 12 mg/hr and add a dose metolazone 2.5 mg x1. No PICC due to possible need for HD down the road.  - Continue Coreg.  - Continue Bidil 1 tab tid.  - No ACEI/ARNI/ARB/spironolactone with CKD stage IV.  2. Atrial flutter: Paroxysmal.  Maintaining NSR. Continue 200 mg amiodarone daily.  - Continue apixaban.  3. AKI on CKD stage IV: Baseline creatinine 3s  Suspect cardiorenal effect with marked volume overload.  Hopefully diuresis with lowering of renal venous pressure will help. Nephrology following.   Creatinine unchanged 4.6  4. Type II diabetes: Poor control.   Consult dietitian and cardiac rehab.   Length of Stay: 3  Amy Clegg, NP  05/24/2019, 11:03 AM  Advanced Heart Failure Team Pager [phone] (M-F; 7a - 4p)  Please contact [facility] for night-coverage after hours (4p -7a ) and weekends on [email or web address]  Patient seen with NP, agree with the above note.   He is diuresing reasonably well, creatinine trending down with diuresis.  He is still significantly volume overloaded on exam.   - Continue Lasix 12 mg/hr and will give a dose of metolazone 2.5 mg x 1 today.  - Suspect he will need diuresis through the weekend as long as creatinine continues to trend down.   Loralie Champagne 05/24/2019 11:21 AM

## 2019-05-24 NOTE — Progress Notes (Signed)
Nutrition Education Note  RD consulted for nutrition education regarding new onset CHF.  RD working remotely. Attempted to call patient x 3 with no answer. Provided "Low Sodium Nutrition Therapy" handout from the [employer] in discharge instructions. Will attempt to follow up is possible.   Body mass index is 49.83 kg/m. Pt meets criteria for morbid obesity based on current BMI.  Current diet order is heart healthy/carb modified, patient is consuming approximately 7-100% of meals at this time. Labs and medications reviewed. No further nutrition interventions warranted at this time. RD contact information provided. If additional nutrition issues arise, please re-consult RD.   Joshua Massey RD, LDN Clinical Nutrition Pager # [phone]

## 2019-05-24 NOTE — Progress Notes (Signed)
Patient ID: Joshua Massey, male   DOB: [birth year], 43 y.o.   MRN: [id number] [facility] Progress Note   Assessment/ Plan:   1. Acute kidney Injury on chronic kidney disease stage IV: This is likely secondary to chronic cardiorenal syndrome exacerbated by recent decompensation.  Fair response to furosemide continuous infusion overnight with decent urine output and some improvement of creatinine.  Needs continued education regarding the gravity of situation and the importance of continued follow-up with nephrology/adherence with diuretic therapy. 2.  Acute exacerbation of chronic diastolic/systolic congestive heart failure: Secondary to nonadherence with medications/diet.  Appears to be significantly volume overloaded and will continue efforts at diuretic therapy/reeducation. 3.  Atrial flutter: Currently appears rate controlled on beta-blocker.  Ongoing management by cardiology. 4.  Hypertension: Blood pressure under acceptable control  Subjective:   Reports to be feeling better today, was able to stand to get his weight.  Has not ambulated/sat in recliner.   Objective:   BP 128/88 (BP Location: Right Arm)   Pulse 73   Temp 98.6 F (37 C) (Oral)   Resp 14   Ht 5\' 11"  (1.803 m)   Wt (!) 162.1 kg   SpO2 98%   BMI 49.83 kg/m   Intake/Output Summary (Last 24 hours) at 05/24/2019 1015 Last data filed at 05/24/2019 0900 Gross per 24 hour  Intake 468.4 ml  Output 2800 ml  Net -2331.6 ml   Weight change: -2.53 kg  Physical Exam: Gen: Comfortably resting in bed, watching television CVS: Pulse regular rhythm, normal rate, S1 and S2 normal Resp: Distant breath sounds bilaterally, no distinct rales or rhonchi Abd: Soft, obese, nontender Ext: Bilateral Ace wraps, 1-2+ lower extremity edema  Imaging: No results found.  Labs: BMET Recent Labs  Lab 05/21/19 1500 05/22/19 0301 05/23/19 0339 05/24/19 0246  NA 143 141 142 143  K 4.6 4.5 4.2 4.1  CL 106 108 110 107   CO2 23 21* 21* 22  GLUCOSE 112* 91 111* 109*  BUN 94* 95* 92* 91*  CREATININE 4.95* 4.79* 4.70* 4.56*  CALCIUM 8.4* 8.3* 8.5* 8.6*  PHOS  --   --  4.9* 4.4   CBC Recent Labs  Lab 05/21/19 1500 05/24/19 0246  WBC 5.0 6.0  HGB 11.5* 11.0*  HCT 37.1* 34.0*  MCV 96.6 93.4  PLT 181 170    Medications:    . amiodarone  200 mg Oral Daily  . apixaban  5 mg Oral BID  . atorvastatin  10 mg Oral Daily  . carvedilol  25 mg Oral BID WC  . ferrous sulfate  325 mg Oral BID  . influenza vac split quadrivalent PF  0.5 mL Intramuscular Tomorrow-1000  . insulin aspart  0-20 Units Subcutaneous TID WC  . insulin glargine  1 Units Subcutaneous QHS  . isosorbide-hydrALAZINE  1 tablet Oral TID  . potassium chloride  10 mEq Oral Daily  . sodium chloride flush  3 mL Intravenous Q12H   Elmarie Shiley, MD 05/24/2019, 10:15 AM

## 2019-05-25 DIAGNOSIS — E1165 Type 2 diabetes mellitus with hyperglycemia: Secondary | ICD-10-CM

## 2019-05-25 DIAGNOSIS — D508 Other iron deficiency anemias: Secondary | ICD-10-CM

## 2019-05-25 DIAGNOSIS — I5043 Acute on chronic combined systolic (congestive) and diastolic (congestive) heart failure: Secondary | ICD-10-CM

## 2019-05-25 DIAGNOSIS — D638 Anemia in other chronic diseases classified elsewhere: Secondary | ICD-10-CM

## 2019-05-25 DIAGNOSIS — N189 Chronic kidney disease, unspecified: Secondary | ICD-10-CM

## 2019-05-25 DIAGNOSIS — R7989 Other specified abnormal findings of blood chemistry: Secondary | ICD-10-CM

## 2019-05-25 DIAGNOSIS — L97901 Non-pressure chronic ulcer of unspecified part of unspecified lower leg limited to breakdown of skin: Secondary | ICD-10-CM

## 2019-05-25 DIAGNOSIS — I480 Paroxysmal atrial fibrillation: Secondary | ICD-10-CM

## 2019-05-25 DIAGNOSIS — I5023 Acute on chronic systolic (congestive) heart failure: Secondary | ICD-10-CM | POA: Diagnosis not present

## 2019-05-25 DIAGNOSIS — E1122 Type 2 diabetes mellitus with diabetic chronic kidney disease: Secondary | ICD-10-CM

## 2019-05-25 LAB — RENAL FUNCTION PANEL
Albumin: 2.6 g/dL — ABNORMAL LOW (ref 3.5–5.0)
Anion gap: 12 (ref 5–15)
BUN: 92 mg/dL — ABNORMAL HIGH (ref 6–20)
CO2: 23 mmol/L (ref 22–32)
Calcium: 8.6 mg/dL — ABNORMAL LOW (ref 8.9–10.3)
Chloride: 108 mmol/L (ref 98–111)
Creatinine, Ser: 4.48 mg/dL — ABNORMAL HIGH (ref 0.61–1.24)
GFR calc Af Amer: 17 mL/min — ABNORMAL LOW (ref >60)
GFR calc non Af Amer: 15 mL/min — ABNORMAL LOW (ref >60)
Glucose, Bld: 97 mg/dL (ref 70–99)
Phosphorus: 4.4 mg/dL (ref 2.5–4.6)
Potassium: 4.1 mmol/L (ref 3.5–5.1)
Sodium: 143 mmol/L (ref 135–145)

## 2019-05-25 LAB — GLUCOSE, CAPILLARY
Glucose-Capillary: 94 mg/dL (ref 70–99)
Glucose-Capillary: 122 mg/dL — ABNORMAL HIGH (ref 70–99)
Glucose-Capillary: 124 mg/dL — ABNORMAL HIGH (ref 70–99)
Glucose-Capillary: 106 mg/dL — ABNORMAL HIGH (ref 70–99)

## 2019-05-25 LAB — FERRITIN: Ferritin: 135 ng/mL (ref 24–336)

## 2019-05-25 LAB — CBC
HCT: 34.4 % — ABNORMAL LOW (ref 39.0–52.0)
Hemoglobin: 11.3 g/dL — ABNORMAL LOW (ref 13.0–17.0)
MCH: 30.3 pg (ref 26.0–34.0)
MCHC: 32.8 g/dL (ref 30.0–36.0)
MCV: 92.2 fL (ref 80.0–100.0)
Platelets: 182 10*3/uL (ref 150–400)
RBC: 3.73 MIL/uL — ABNORMAL LOW (ref 4.22–5.81)
RDW: 17.1 % — ABNORMAL HIGH (ref 11.5–15.5)
WBC: 5.8 10*3/uL (ref 4.0–10.5)
nRBC: 0 % (ref 0.0–0.2)

## 2019-05-25 LAB — IRON AND TIBC
Iron: 28 ug/dL — ABNORMAL LOW (ref 45–182)
Saturation Ratios: 9 % — ABNORMAL LOW (ref 17.9–39.5)
TIBC: 300 ug/dL (ref 250–450)
UIBC: 272 ug/dL

## 2019-05-25 LAB — RETICULOCYTES
Immature Retic Fract: 15.4 % (ref 2.3–15.9)
RBC.: 3.77 MIL/uL — ABNORMAL LOW (ref 4.22–5.81)
Retic Count, Absolute: 51.3 10*3/uL (ref 19.0–186.0)
Retic Ct Pct: 1.4 % (ref 0.4–3.1)

## 2019-05-25 LAB — VITAMIN B12: Vitamin B-12: 996 pg/mL — ABNORMAL HIGH (ref 180–914)

## 2019-05-25 LAB — BRAIN NATRIURETIC PEPTIDE: B Natriuretic Peptide: 1682.3 pg/mL — ABNORMAL HIGH (ref 0.0–100.0)

## 2019-05-25 LAB — FOLATE: Folate: 15.7 ng/mL (ref >5.9)

## 2019-05-25 MED ORDER — SODIUM CHLORIDE 0.9 % IV SOLN
125.0000 mg | Freq: Once | INTRAVENOUS | Status: AC
  Administered 2019-05-25: 15:00:00 125 mg via INTRAVENOUS
  Filled 2019-05-25: qty 10

## 2019-05-25 MED ORDER — METOLAZONE 5 MG PO TABS
2.5000 mg | ORAL_TABLET | Freq: Once | ORAL | Status: AC
  Administered 2019-05-25: 2.5 mg via ORAL
  Filled 2019-05-25: qty 1

## 2019-05-25 NOTE — Progress Notes (Signed)
Progress Note  Patient Name: Joshua Massey Date of Encounter: 05/25/2019  Primary Cardiologist:   No primary care provider on file.   Subjective   No chest pain.  Breathing OK.    Inpatient Medications    Scheduled Meds: . amiodarone  200 mg Oral Daily  . apixaban  5 mg Oral BID  . atorvastatin  10 mg Oral Daily  . carvedilol  25 mg Oral BID WC  . ferrous sulfate  325 mg Oral BID  . influenza vac split quadrivalent PF  0.5 mL Intramuscular Tomorrow-1000  . insulin aspart  0-20 Units Subcutaneous TID WC  . insulin glargine  1 Units Subcutaneous QHS  . isosorbide-hydrALAZINE  1 tablet Oral TID  . potassium chloride  10 mEq Oral Daily  . sodium chloride flush  3 mL Intravenous Q12H   Continuous Infusions: . sodium chloride    . ferric gluconate (FERRLECIT/NULECIT) IV    . furosemide (LASIX) infusion 12 mg/hr (05/25/19 0650)   PRN Meds: sodium chloride, acetaminophen, gabapentin, ondansetron (ZOFRAN) IV, oxyCODONE-acetaminophen, silver sulfADIAZINE, sodium chloride flush   Vital Signs    Vitals:   05/24/19 2007 05/25/19 0225 05/25/19 0424 05/25/19 0948  BP: (!) 134/95  123/86 138/84  Pulse: 68  72 73  Resp: 17  18 16   Temp: 98.7 F (37.1 C)  98 F (36.7 C) 98.2 F (36.8 C)  TempSrc: Oral  Oral Oral  SpO2: 99%  94% 95%  Weight:  (!) 160.7 kg    Height:        Intake/Output Summary (Last 24 hours) at 05/25/2019 1342 Last data filed at 05/25/2019 1240 Gross per 24 hour  Intake 436.53 ml  Output 3725 ml  Net -3288.47 ml   Filed Weights   05/23/19 0500 05/24/19 0518 05/25/19 0225  Weight: (!) 163.7 kg (!) 162.1 kg (!) 160.7 kg    Telemetry    NA - Personally Reviewed  ECG    NA - Personally Reviewed  Physical Exam   GEN: No acute distress.   Neck:   JVD to jaw at 90 degrees Cardiac: RRR, no murmurs, rubs, or gallops.  (distant heart sounds) Respiratory: Clear  to auscultation bilaterally. GI: Soft, nontender, non-distended  MS:   Massive edema  legs, abdomen and scrotum; No deformity. Neuro:  Nonfocal  Psych: Normal affect   Labs    Chemistry Recent Labs  Lab 05/21/19 1651  05/23/19 0339 05/24/19 0246 05/25/19 0235  NA  --    < > 142 143 143  K  --    < > 4.2 4.1 4.1  CL  --    < > 110 107 108  CO2  --    < > 21* 22 23  GLUCOSE  --    < > 111* 109* 97  BUN  --    < > 92* 91* 92*  CREATININE  --    < > 4.70* 4.56* 4.48*  CALCIUM  --    < > 8.5* 8.6* 8.6*  PROT 7.3  --   --   --   --   ALBUMIN 2.8*  --  2.6* 2.5* 2.6*  AST 20  --   --   --   --   ALT 16  --   --   --   --   ALKPHOS 79  --   --   --   --   BILITOT 2.0*  --   --   --   --  GFRNONAA  --    < > 14* 15* 15*  GFRAA  --    < > 16* 17* 17*  ANIONGAP  --    < > 11 14 12    < > = values in this interval not displayed.     Hematology Recent Labs  Lab 05/21/19 1500 05/24/19 0246 05/25/19 0235 05/25/19 0825  WBC 5.0 6.0 5.8  --   RBC 3.84* 3.64* 3.73* 3.77*  HGB 11.5* 11.0* 11.3*  --   HCT 37.1* 34.0* 34.4*  --   MCV 96.6 93.4 92.2  --   MCH 29.9 30.2 30.3  --   MCHC 31.0 32.4 32.8  --   RDW 17.4* 17.2* 17.1*  --   PLT 181 170 182  --     Cardiac EnzymesNo results for input(s): TROPONINI in the last 168 hours. No results for input(s): TROPIPOC in the last 168 hours.   BNP Recent Labs  Lab 05/23/19 0339 05/24/19 0246 05/25/19 0235  BNP 1,739.4* 1,632.7* [id number]*     DDimer No results for input(s): DDIMER in the last 168 hours.   Radiology    No results found.  Cardiac Studies   Echo 08/30/18   1. The left ventricle has severely reduced systolic function, with an ejection fraction of 20-25%. The cavity size was mildly dilated. There is mildly increased left ventricular wall thickness. Left ventricular diastolic Doppler parameters are  consistent with impaired relaxation. Left ventricular diffuse hypokinesis.  2. There is mild sparing of contractile performance of the basal inferolateral segment.  3. The right ventricle has normal  systolic function. The cavity was normal. There is no increase in right ventricular wall thickness.  4. Left atrial size was mild-moderately dilated.  5. Small pericardial effusion.  6. The pericardial effusion is posterior to the left ventricle.  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 Mild thickening of the mitral valve leaflet.  8. Pulmonic valve regurgitation is moderate is mild by color flow Doppler.  9. Pulmonary hypertension is moderate. 10. The inferior vena cava was dilated in size with <50% respiratory variability.  Patient Profile     43 y.o. male  With acute on chronic systolic HE with an echo in 3/20 showed EF 20-25% with mildly dilated LV, normal RV. Suspected nonischemic cardiomyopathy, has had low EF known since 2013, at that time had cath with nonobstructive CAD. He ran out of his meds at home (torsemide in particular) and had significant dietary indiscretion around Thanksgiving.  Creatinine elevated at 4.95 from baseline in the 3's  Assessment & Plan    ACUTE ON CHRONIC SYSTOLIC HF:  Nephrology added metolazone today.  Net negative 9.9 liters since admit.  Negative 3 liters last 24 hours.  Creat seems to be tolerating this.  Agree with continuing the IV Lasix and addition of sequential nephron blockade.     ATRIAL FLUTTER:  Continue amiodarone and Eliquis.     AKI:  Creat stable to trending down slightly.  Will follow.     For questions or updates, please contact [place] Please consult [email or web address] for contact info under Cardiology/STEMI.   Signed, Minus Breeding, MD  05/25/2019, 1:42 PM

## 2019-05-25 NOTE — Progress Notes (Addendum)
Patient ID: Joshua Massey, male   DOB: 07/18/1975, 43 y.o.   MRN: [id number] [facility] Progress Note   Assessment/ Plan:   1. Acute kidney Injury on chronic kidney disease stage IV:  Likely secondary to chronic cardiorenal syndrome exacerbated by recent decompensation.  Decent urine output overnight with some downtrend of creatinine and without any acute electrolyte abnormality/indication for dialysis.  Corresponding decrease of weight noted, will supplement with metolazone today.  Reeducated him regarding importance of adherence with medications and outpatient follow-up. 2.  Acute exacerbation of chronic diastolic/systolic congestive heart failure: Secondary to nonadherence with medications/diet.  Appears to be significantly volume overloaded and will continue efforts at diuretic therapy/reeducation. 3.  Atrial flutter: Currently appears rate controlled on beta-blocker.  Ongoing management by cardiology. 4.  Hypertension: Blood pressure under acceptable control  Subjective:   Denies any chest pain or shortness of breath, still feeling deconditioned.  Inquires about whether he will get more metolazone today.   Objective:   BP 123/86 (BP Location: Right Arm)   Pulse 72   Temp 98 F (36.7 C) (Oral)   Resp 18   Ht 5\' 11"  (1.803 m)   Wt (!) 160.7 kg   SpO2 94%   BMI 49.41 kg/m   Intake/Output Summary (Last 24 hours) at 05/25/2019 0924 Last data filed at 05/25/2019 0834 Gross per 24 hour  Intake 436.53 ml  Output 3475 ml  Net -3038.47 ml   Weight change: -1.361 kg  Physical Exam: Gen: Sitting up comfortably in bed watching television CVS: Pulse regular rhythm, normal rate, S1 and S2 normal Resp: Distant breath sounds bilaterally, no distinct rales or rhonchi Abd: Soft, obese, nontender Ext: Bilateral Ace wraps, 1-2+ lower extremity edema  Imaging: No results found.  Labs: BMET Recent Labs  Lab 05/21/19 1500 05/22/19 0301 05/23/19 0339 05/24/19 0246  05/25/19 0235  NA 143 141 142 143 143  K 4.6 4.5 4.2 4.1 4.1  CL 106 108 110 107 108  CO2 23 21* 21* 22 23  GLUCOSE 112* 91 111* 109* 97  BUN 94* 95* 92* 91* 92*  CREATININE 4.95* 4.79* 4.70* 4.56* 4.48*  CALCIUM 8.4* 8.3* 8.5* 8.6* 8.6*  PHOS  --   --  4.9* 4.4 4.4   CBC Recent Labs  Lab 05/21/19 1500 05/24/19 0246 05/25/19 0235  WBC 5.0 6.0 5.8  HGB 11.5* 11.0* 11.3*  HCT 37.1* 34.0* 34.4*  MCV 96.6 93.4 92.2  PLT 181 170 182    Medications:    . amiodarone  200 mg Oral Daily  . apixaban  5 mg Oral BID  . atorvastatin  10 mg Oral Daily  . carvedilol  25 mg Oral BID WC  . ferrous sulfate  325 mg Oral BID  . influenza vac split quadrivalent PF  0.5 mL Intramuscular Tomorrow-1000  . insulin aspart  0-20 Units Subcutaneous TID WC  . insulin glargine  1 Units Subcutaneous QHS  . isosorbide-hydrALAZINE  1 tablet Oral TID  . potassium chloride  10 mEq Oral Daily  . sodium chloride flush  3 mL Intravenous Q12H   Elmarie Shiley, MD 05/25/2019, 9:24 AM

## 2019-05-25 NOTE — Progress Notes (Addendum)
PROGRESS NOTE  Joshua Massey [id number] DOB: [birth year]   PCP: [name], [name], MD  Patient is from: Home.  Uses cane and walker at baseline.  DOA: 05/21/2019 LOS: 4  Brief Narrative / Interim history: 43 year old male with history of DM-2, HTN, CKD-4, lower extremity diabetic wound followed by Dr. Sharol Given, paroxysmal A. fib on Eliquis, combined CHF presented with shortness of breath and edema in the setting of doses of diuretics and dietary indiscretion, and admitted for acute on chronic CHF.  On IV Lasix drip per advanced heart failure.  Subjective: No major events overnight of this morning.  Sleepy but awakes to voice easily.  No complaints this morning.  He denies chest pain, shortness of breath, GI or UTI symptoms.  Of note, was lying flat in bed without distress.  Objective: Vitals:   05/24/19 2007 05/25/19 0225 05/25/19 0424 05/25/19 0948  BP: (!) 134/95  123/86 138/84  Pulse: 68  72 73  Resp: 17  18 16   Temp: 98.7 F (37.1 C)  98 F (36.7 C) 98.2 F (36.8 C)  TempSrc: Oral  Oral Oral  SpO2: 99%  94% 95%  Weight:  (!) 160.7 kg    Height:        Intake/Output Summary (Last 24 hours) at 05/25/2019 1246 Last data filed at 05/25/2019 1240 Gross per 24 hour  Intake 436.53 ml  Output 3725 ml  Net -3288.47 ml   Filed Weights   05/23/19 0500 05/24/19 0518 05/25/19 0225  Weight: (!) 163.7 kg (!) 162.1 kg (!) 160.7 kg    Examination:  GENERAL: No acute distress.  Appears well.  HEENT: MMM.  Vision and hearing grossly intact.  NECK: Supple.  No apparent JVD.  RESP:  No IWOB. Good air movement bilaterally.  No rales. CVS:  RRR. Heart sounds normal.  ABD/GI/GU: Bowel sounds present. Soft. Non tender.  MSK/EXT: No Unna boots over bilateral lower extremities. SKIN: Minor bruise over bilateral lower extremities. NEURO: Awake, alert and oriented appropriately.  No gross deficit.  PSYCH: Calm. Normal affect.    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  LHC with nonobstructive CAD in 2013.  Run out of his torsemide.  Also dietary indiscretion.  CXR consistent with CHF.  BNP elevated.  Started on Lasix drip-UOP 3.5 L / 24 hours.  Net -9 L.  Weight down.  Renal function is stable -Advanced heart failure managing-continue Lasix drip and metolazone -Continue Coreg and BiDil -No ACEI/ARNI/ARB/Aldactone due to renal function -Appreciate dietitian input about dietary counseling -Counseled on the importance of compliance  Uncontrolled DM-2 with CKD-4: A1c 8.5% in 2019. CBG (last 3)  Recent Labs    05/24/19 2127 05/25/19 0620 05/25/19 1153  GLUCAP 105* 94 122*  -Continue current regimen -Check hemoglobin A1c -Continue statin  AKI on CKD-4/azotemia: Cr 3.6-3.7 (baseline)> 4.95 (admit)>>> 4.48-concern for cardiorenal syndrome.  Improving with diuretics. -Nephrology on board -Continue monitoring on diuretics.  Anemia: Combination of ACD and IDA: Hgb 10-11 (baseline)> 11.5>> 11.3.  Anemia panel consistent with iron deficiency.  Stable. -IV ferric gluconate -Continue p.o. ferrous sulfate with bowel regimen  Paroxysmal A. Fib/flutter: NSR -Amiodarone and Eliquis per cardiology  Essential hypertension: Normotensive -Cardiac meds as above  Physical deconditioning: Reportedly uses cane.  -PT/OT eval  Morbid obesity: BMI 49.41. -Encourage lifestyle change to lose weight -Could benefit from newer diabetic agents with cardiovascular benefits -Appreciate dietitian input.  Lower extremity superficial wounds: POA.  Followed by Dr. Sharol Given. -Appreciate wound care input-"patient has superficial wounds to the right posterior achilles area that measures 2 cm x 2 cm and is 100% pink and clean.  The right LE lateral aspect as two small abrasion that are also 100% pink and clean.  The left dorsal foot has a superficial wound that measures 1.6 cm x 4 cm and is 100% clean and pink.  The  patient states these started from compression stockings that rolled and pleated and caused pressure to the areas.   Patient states he has worn unna boots in the past and they worked well for him.  Recruitment consultant boots bilaterally, change weekly per WOC               DVT prophylaxis: On Eliquis for A. fib Code Status: Full code Family Communication: Patient and/or Therapist, sports. Available if any question. Disposition Plan: Remains inpatient for adequate diuresis Consultants: Advanced HF and nephrology   Microbiology summarized: None  Sch Meds:  Scheduled Meds: . amiodarone  200 mg Oral Daily  . apixaban  5 mg Oral BID  . atorvastatin  10 mg Oral Daily  . carvedilol  25 mg Oral BID WC  . ferrous sulfate  325 mg Oral BID  . influenza vac split quadrivalent PF  0.5 mL Intramuscular Tomorrow-1000  . insulin aspart  0-20 Units Subcutaneous TID WC  . insulin glargine  1 Units Subcutaneous QHS  . isosorbide-hydrALAZINE  1 tablet Oral TID  . potassium chloride  10 mEq Oral Daily  . sodium chloride flush  3 mL Intravenous Q12H   Continuous Infusions: . sodium chloride    . ferric gluconate (FERRLECIT/NULECIT) IV    . furosemide (LASIX) infusion 12 mg/hr (05/25/19 0650)   PRN Meds:.sodium chloride, acetaminophen, gabapentin, ondansetron (ZOFRAN) IV, oxyCODONE-acetaminophen, silver sulfADIAZINE, sodium chloride flush  Antimicrobials: Anti-infectives (From admission, onward)   None       I have personally reviewed the following labs and images: CBC: Recent Labs  Lab 05/21/19 1500 05/24/19 0246 05/25/19 0235  WBC 5.0 6.0 5.8  HGB 11.5* 11.0* 11.3*  HCT 37.1* 34.0* 34.4*  MCV 96.6 93.4 92.2  PLT 181 170 182   BMP &GFR Recent Labs  Lab 05/21/19 1500 05/22/19 0301 05/23/19 0339 05/24/19 0246 05/25/19 0235  NA 143 141 142 143 143  K 4.6 4.5 4.2 4.1 4.1  CL 106 108 110 107 108  CO2 23 21* 21* 22 23  GLUCOSE 112* 91 111* 109* 97  BUN 94* 95* 92* 91* 92*  CREATININE 4.95* 4.79*  4.70* 4.56* 4.48*  CALCIUM 8.4* 8.3* 8.5* 8.6* 8.6*  PHOS  --   --  4.9* 4.4 4.4   Estimated Creatinine Clearance: 33.3 mL/min (A) (by C-G formula based on SCr of 4.48 mg/dL (H)). Liver & Pancreas: Recent Labs  Lab 05/21/19 1651 05/23/19 0339 05/24/19 0246 05/25/19 0235  AST 20  --   --   --   ALT 16  --   --   --   ALKPHOS 79  --   --   --   BILITOT 2.0*  --   --   --   PROT 7.3  --   --   --   ALBUMIN 2.8* 2.6* 2.5* 2.6*   No results for input(s): LIPASE, AMYLASE in the last 168 hours. No results for input(s): AMMONIA in the last 168 hours. Diabetic: No results for input(s): HGBA1C in the last 72 hours. Recent Labs  Lab 05/24/19 1126 05/24/19 1708 05/24/19 2127 05/25/19 0620 05/25/19 1153  GLUCAP 123* 117* 105* 94 122*   Cardiac Enzymes: No results for input(s): CKTOTAL, CKMB, CKMBINDEX, TROPONINI in the last 168 hours. No results for input(s): PROBNP in the last 8760 hours. Coagulation Profile: No results for input(s): INR, PROTIME in the last 168 hours. Thyroid Function Tests: No results for input(s): TSH, T4TOTAL, FREET4, T3FREE, THYROIDAB in the last 72 hours. Lipid Profile: No results for input(s): CHOL, HDL, LDLCALC, TRIG, CHOLHDL, LDLDIRECT in the last 72 hours. Anemia Panel: Recent Labs    05/25/19 0825  VITAMINB12 996*  FOLATE 15.7  FERRITIN 135  TIBC 300  IRON 28*  RETICCTPCT 1.4   Urine analysis:    Component Value Date/Time   COLORURINE YELLOW 05/21/2019 1724   APPEARANCEUR CLEAR 05/21/2019 1724   LABSPEC 1.012 05/21/2019 1724   PHURINE 5.0 05/21/2019 1724   GLUCOSEU NEGATIVE 05/21/2019 1724   HGBUR MODERATE (A) 05/21/2019 1724   BILIRUBINUR NEGATIVE 05/21/2019 1724   KETONESUR NEGATIVE 05/21/2019 1724   PROTEINUR 100 (A) 05/21/2019 1724   UROBILINOGEN 0.2 09/02/2009 0935   NITRITE NEGATIVE 05/21/2019 1724   LEUKOCYTESUR NEGATIVE 05/21/2019 1724   Sepsis Labs: Invalid input(s): PROCALCITONIN, [place]  Microbiology: No results  found for this or any previous visit (from the past 240 hour(s)).  Radiology Studies: No results found.   35 minutes with more than 50% spent in reviewing records, counseling patient/family and coordinating care.    T. [place]  If 7PM-7AM, please contact night-coverage [email or web address] Password [email or web address] 05/25/2019, 12:46 PM

## 2019-05-25 NOTE — Evaluation (Signed)
Physical Therapy Evaluation Patient Details Name: Joshua Massey MRN: [id number] DOB: Jul 28, 1975 Today's Date: 05/25/2019   History of Present Illness  Pt is a 43 year old male with history of DM-2, HTN, CKD-4, lower extremity diabetic wound followed by Dr. Sharol Given, paroxysmal A. fib on Eliquis, combined CHF presented with shortness of breath and edema in the setting of doses of diuretics and dietary indiscretion, and admitted for acute on chronic CHF.   Clinical Impression  Pt admitted with above diagnosis. Pt presents with decreased functional mobility secondary to decreased cardiopulmonary endurance, weakness, and balance impairments. Ambulating 50 feet with use of IV pole and min assist. HR 70-77 bpm, BP 121/93 pre mobility, BP 127/92 post mobility. Would likely benefit from use of walker for additional stability. Pt currently with functional limitations due to the deficits listed below (see PT Problem List). Pt will benefit from skilled PT to increase their independence and safety with mobility to allow discharge to the venue listed below.       Follow Up Recommendations Home health PT;Supervision for mobility/OOB    Equipment Recommendations  Rolling walker with 5" wheels(bariatric)    Recommendations for Other Services       Precautions / Restrictions Precautions Precautions: Fall Restrictions Weight Bearing Restrictions: No      Mobility  Bed Mobility Overal bed mobility: Needs Assistance Bed Mobility: Supine to Sit     Supine to sit: Min guard     General bed mobility comments: Increased time/effort with HOB elevated and use of bed rail  Transfers Overall transfer level: Needs assistance Equipment used: Rolling walker (2 wheeled) Transfers: Sit to/from Stand Sit to Stand: Min guard         General transfer comment: use of momentum  Ambulation/Gait Ambulation/Gait assistance: Min Web designer (Feet): 50 Feet Assistive device: IV Pole Gait  Pattern/deviations: Step-through pattern;Decreased stride length;Wide base of support Gait velocity: decreased   General Gait Details: Utilizing very wide BOS with bilateral foot external rotation. Pt holding onto IV pole and reaching for additional support. MinA for stability  Stairs            Wheelchair Mobility    Modified Rankin (Stroke Patients Only)       Balance Overall balance assessment: Needs assistance Sitting-balance support: Feet supported Sitting balance-Leahy Scale: Good     Standing balance support: Single extremity supported;During functional activity Standing balance-Leahy Scale: Fair                               Pertinent Vitals/Pain Pain Assessment: No/denies pain    Home Living Family/patient expects to be discharged to:: Private residence Living Arrangements: Children;Spouse/significant other Available Help at Discharge: Family;Available PRN/intermittently Type of Home: Apartment Home Access: Stairs to enter(1 step (curb))     Home Layout: One level Home Equipment: Cane - single point      Prior Function Level of Independence: Independent with assistive device(s)         Comments: uses cane     Hand Dominance   Dominant Hand: Right    Extremity/Trunk Assessment   Upper Extremity Assessment Upper Extremity Assessment: Generalized weakness    Lower Extremity Assessment Lower Extremity Assessment: Generalized weakness(increased edema)    Cervical / Trunk Assessment Cervical / Trunk Assessment: Other exceptions Cervical / Trunk Exceptions: increased body habitus  Communication   Communication: No difficulties  Cognition Arousal/Alertness: Awake/alert Behavior During Therapy: WFL for tasks assessed/performed Overall Cognitive Status:  Within Functional Limits for tasks assessed                                        General Comments      Exercises     Assessment/Plan    PT Assessment  Patient needs continued PT services  PT Problem List Decreased strength;Decreased activity tolerance;Decreased balance;Decreased mobility;Cardiopulmonary status limiting activity       PT Treatment Interventions DME instruction;Functional mobility training;Therapeutic activities;Gait training;Therapeutic exercise;Balance training;Patient/family education    PT Goals (Current goals can be found in the Care Plan section)  Acute Rehab PT Goals Patient Stated Goal: "get this fluid off of me." PT Goal Formulation: With patient Time For Goal Achievement: 06/08/19 Potential to Achieve Goals: Good    Frequency Min 3X/week   Barriers to discharge        Co-evaluation               AM-PAC PT "6 Clicks" Mobility  Outcome Measure Help needed turning from your back to your side while in a flat bed without using bedrails?: None Help needed moving from lying on your back to sitting on the side of a flat bed without using bedrails?: A Little Help needed moving to and from a bed to a chair (including a wheelchair)?: A Little Help needed standing up from a chair using your arms (e.g., wheelchair or bedside chair)?: A Little Help needed to walk in hospital room?: A Little Help needed climbing 3-5 steps with a railing? : A Lot 6 Click Score: 18    End of Session Equipment Utilized During Treatment: Gait belt Activity Tolerance: Patient tolerated treatment well Patient left: in bed;with call bell/phone within reach Nurse Communication: Mobility status PT Visit Diagnosis: Unsteadiness on feet (R26.81);Muscle weakness (generalized) (M62.81);Difficulty in walking, not elsewhere classified (R26.2)    Time: [id number] PT Time Calculation (min) (ACUTE ONLY): 32 min   Charges:   PT Evaluation $PT Eval Moderate Complexity: 1 Mod PT Treatments $Therapeutic Activity: 8-22 mins        Ellamae Sia, PT, DPT Acute Rehabilitation Services Pager [phone] Office  [phone]   Willy Eddy 05/25/2019, 5:25 PM

## 2019-05-26 DIAGNOSIS — N184 Chronic kidney disease, stage 4 (severe): Secondary | ICD-10-CM

## 2019-05-26 LAB — RENAL FUNCTION PANEL
Albumin: 2.5 g/dL — ABNORMAL LOW (ref 3.5–5.0)
Anion gap: 15 (ref 5–15)
BUN: 91 mg/dL — ABNORMAL HIGH (ref 6–20)
CO2: 22 mmol/L (ref 22–32)
Calcium: 8.6 mg/dL — ABNORMAL LOW (ref 8.9–10.3)
Chloride: 105 mmol/L (ref 98–111)
Creatinine, Ser: 4.36 mg/dL — ABNORMAL HIGH (ref 0.61–1.24)
GFR calc Af Amer: 18 mL/min — ABNORMAL LOW (ref >60)
GFR calc non Af Amer: 16 mL/min — ABNORMAL LOW (ref >60)
Glucose, Bld: 85 mg/dL (ref 70–99)
Phosphorus: 4.6 mg/dL (ref 2.5–4.6)
Potassium: 4.1 mmol/L (ref 3.5–5.1)
Sodium: 142 mmol/L (ref 135–145)

## 2019-05-26 LAB — CBC
HCT: 35 % — ABNORMAL LOW (ref 39.0–52.0)
Hemoglobin: 11.2 g/dL — ABNORMAL LOW (ref 13.0–17.0)
MCH: 30.4 pg (ref 26.0–34.0)
MCHC: 32 g/dL (ref 30.0–36.0)
MCV: 94.9 fL (ref 80.0–100.0)
Platelets: 182 10*3/uL (ref 150–400)
RBC: 3.69 MIL/uL — ABNORMAL LOW (ref 4.22–5.81)
RDW: 17.2 % — ABNORMAL HIGH (ref 11.5–15.5)
WBC: 5.8 10*3/uL (ref 4.0–10.5)
nRBC: 0 % (ref 0.0–0.2)

## 2019-05-26 LAB — BRAIN NATRIURETIC PEPTIDE: B Natriuretic Peptide: 1444 pg/mL — ABNORMAL HIGH (ref 0.0–100.0)

## 2019-05-26 LAB — GLUCOSE, CAPILLARY
Glucose-Capillary: 95 mg/dL (ref 70–99)
Glucose-Capillary: 89 mg/dL (ref 70–99)
Glucose-Capillary: 106 mg/dL — ABNORMAL HIGH (ref 70–99)
Glucose-Capillary: 110 mg/dL — ABNORMAL HIGH (ref 70–99)

## 2019-05-26 LAB — MAGNESIUM: Magnesium: 2.3 mg/dL (ref 1.7–2.4)

## 2019-05-26 MED ORDER — METOLAZONE 5 MG PO TABS
5.0000 mg | ORAL_TABLET | Freq: Once | ORAL | Status: AC
  Administered 2019-05-26: 5 mg via ORAL
  Filled 2019-05-26: qty 1

## 2019-05-26 NOTE — Progress Notes (Signed)
PROGRESS NOTE  Joshua Massey [id number] DOB: 1975/09/15   PCP: [name], [name], MD  Patient is from: Home.  Uses cane and walker at baseline.  DOA: 05/21/2019 LOS: 5  Brief Narrative / Interim history: 43 year old male with history of DM-2, HTN, CKD-4, lower extremity diabetic wound followed by Dr. Sharol Given, paroxysmal A. fib on Eliquis, combined CHF presented with shortness of breath and edema in the setting of doses of diuretics and dietary indiscretion, and admitted for acute on chronic CHF.  On IV Lasix drip per advanced heart failure.  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  LHC with nonobstructive CAD in 2013.  Ran out of his torsemide.  Also dietary indiscretion.  CXR consistent with CHF.  BNP elevated.  Started on Lasix drip-UOP 3.5 L / 24 hours.  Net> -11 L.   Renal function is stable and in fact improving very slowly. -Advanced heart failure managing-continue Lasix drip and metolazone -Continue Coreg and BiDil -No ACEI/ARNI/ARB/Aldactone due to renal function -Appreciate dietitian input about dietary counseling -Counseled on the importance of compliance  Uncontrolled DM-2 with CKD-4: A1c 8.5% in 2019. CBG (last 3)  Recent Labs    05/25/19 1639 05/25/19 2109 05/26/19 0622  GLUCAP 124* 106* 95  -Blood sugar fairly controlled.  He takes 5 units of Lantus at home.  He is here only on ?  1 unit of Lantus daily.  Not sure what purpose that dose of Lantus is serving.  We will discontinue that.  Continue SSI.   AKI on CKD-4/azotemia: Cr 3.6-3.7 (baseline)> 4.95 (admit)>>> 4.48-likely due to cardiorenal syndrome.  Improving slowly with diuretics. -Nephrology on board -Continue monitoring on diuretics.  Anemia: Combination of ACD and IDA: Hgb 10-11 (baseline)> 11.5>> 11.3> 11.2.  Anemia panel consistent with iron deficiency.  Stable. -IV ferric gluconate on 05/25/2019. -Continue p.o. ferrous sulfate with bowel  regimen  Paroxysmal A. Fib/flutter: NSR -Amiodarone and Eliquis per cardiology  Essential hypertension: Normotensive -Cardiac meds as above  Physical deconditioning: Reportedly uses cane.  -PT/OT eval  Morbid obesity: BMI 49.41. -Encourage lifestyle change to lose weight -Could benefit from newer diabetic agents with cardiovascular benefits -Appreciate dietitian input.  Lower extremity superficial wounds: POA.  Followed by Dr. Sharol Given. -Appreciate wound care input-"patient has superficial wounds to the right posterior achilles area that measures 2 cm x 2 cm and is 100% pink and clean.  The right LE lateral aspect as two small abrasion that are also 100% pink and clean.  The left dorsal foot has a superficial wound that measures 1.6 cm x 4 cm and is 100% clean and pink.  The patient states these started from compression stockings that rolled and pleated and caused pressure to the areas.   Patient states he has worn unna boots in the past and they worked well for him.  Recruitment consultant boots bilaterally, change weekly per WOC    DVT prophylaxis: On Eliquis for A. fib Code Status: Full code Family Communication: No family present.  Patient alert and oriented.  Discussed plan of care with him and he verbalized understanding. Disposition Plan: Remains inpatient for adequate diuresis Consultants: Advanced HF and nephrology  Subjective: Patient seen and examined.  He sitting at the edge of the bed.  He has no complaints.  He is comfortable.  On room air.  Objective: Vitals:   05/25/19 1648 05/25/19 2002 05/26/19 0455 05/26/19 0836  BP: (!) 121/93 (!) 134/97 139/90 125/82  Pulse:  72 74 76  Resp:  20 16 16   Temp:  98 F (36.7 C) (!) 97.5 F (36.4 C) 97.7 F (36.5 C)  TempSrc:  Oral Oral Oral  SpO2:  97% 96% 100%  Weight:   (!) 157.9 kg   Height:        Intake/Output Summary (Last 24 hours) at 05/26/2019 0943 Last data filed at 05/26/2019 0504 Gross per 24 hour  Intake 536.53 ml  Output 2375  ml  Net -1838.47 ml   Filed Weights   05/24/19 0518 05/25/19 0225 05/26/19 0455  Weight: (!) 162.1 kg (!) 160.7 kg (!) 157.9 kg    Examination:  General exam: Appears calm and comfortable, morbidly obese Respiratory system: Clear to auscultation. Respiratory effort normal. Cardiovascular system: S1 & S2 heard, RRR. No JVD, murmurs, rubs, gallops or clicks. No pedal edema. Gastrointestinal system: Abdomen is nondistended, soft and nontender. No organomegaly or masses felt. Normal bowel sounds heard. Central nervous system: Alert and oriented. No focal neurological deficits. Extremities: Symmetric 5 x 5 power. Skin: Dressing in bilateral lower extremities. Psychiatry: Judgement and insight appear normal. Mood & affect appropriate.   Procedures:  None   Microbiology summarized: None  Sch Meds:  Scheduled Meds: . amiodarone  200 mg Oral Daily  . apixaban  5 mg Oral BID  . atorvastatin  10 mg Oral Daily  . carvedilol  25 mg Oral BID WC  . ferrous sulfate  325 mg Oral BID  . influenza vac split quadrivalent PF  0.5 mL Intramuscular Tomorrow-1000  . insulin aspart  0-20 Units Subcutaneous TID WC  . isosorbide-hydrALAZINE  1 tablet Oral TID  . metolazone  5 mg Oral Once  . potassium chloride  10 mEq Oral Daily  . sodium chloride flush  3 mL Intravenous Q12H   Continuous Infusions: . sodium chloride    . furosemide (LASIX) infusion 12 mg/hr (05/26/19 0400)   PRN Meds:.sodium chloride, acetaminophen, gabapentin, ondansetron (ZOFRAN) IV, oxyCODONE-acetaminophen, silver sulfADIAZINE, sodium chloride flush  Antimicrobials: Anti-infectives (From admission, onward)   None       I have personally reviewed the following labs and images: CBC: Recent Labs  Lab 05/21/19 1500 05/24/19 0246 05/25/19 0235 05/26/19 0310  WBC 5.0 6.0 5.8 5.8  HGB 11.5* 11.0* 11.3* 11.2*  HCT 37.1* 34.0* 34.4* 35.0*  MCV 96.6 93.4 92.2 94.9  PLT 181 170 182 182   BMP &GFR Recent Labs  Lab  05/22/19 0301 05/23/19 0339 05/24/19 0246 05/25/19 0235 05/26/19 0310  NA 141 142 143 143 142  K 4.5 4.2 4.1 4.1 4.1  CL 108 110 107 108 105  CO2 21* 21* 22 23 22   GLUCOSE 91 111* 109* 97 85  BUN 95* 92* 91* 92* 91*  CREATININE 4.79* 4.70* 4.56* 4.48* 4.36*  CALCIUM 8.3* 8.5* 8.6* 8.6* 8.6*  MG  --   --   --   --  2.3  PHOS  --  4.9* 4.4 4.4 4.6   Estimated Creatinine Clearance: 33.8 mL/min (A) (by C-G formula based on SCr of 4.36 mg/dL (H)). Liver & Pancreas: Recent Labs  Lab 05/21/19 1651 05/23/19 0339 05/24/19 0246 05/25/19 0235 05/26/19 0310  AST 20  --   --   --   --   ALT 16  --   --   --   --   ALKPHOS 79  --   --   --   --   BILITOT 2.0*  --   --   --   --  PROT 7.3  --   --   --   --   ALBUMIN 2.8* 2.6* 2.5* 2.6* 2.5*   No results for input(s): LIPASE, AMYLASE in the last 168 hours. No results for input(s): AMMONIA in the last 168 hours. Diabetic: No results for input(s): HGBA1C in the last 72 hours. Recent Labs  Lab 05/25/19 0620 05/25/19 1153 05/25/19 1639 05/25/19 2109 05/26/19 0622  GLUCAP 94 122* 124* 106* 95   Cardiac Enzymes: No results for input(s): CKTOTAL, CKMB, CKMBINDEX, TROPONINI in the last 168 hours. No results for input(s): PROBNP in the last 8760 hours. Coagulation Profile: No results for input(s): INR, PROTIME in the last 168 hours. Thyroid Function Tests: No results for input(s): TSH, T4TOTAL, FREET4, T3FREE, THYROIDAB in the last 72 hours. Lipid Profile: No results for input(s): CHOL, HDL, LDLCALC, TRIG, CHOLHDL, LDLDIRECT in the last 72 hours. Anemia Panel: Recent Labs    05/25/19 0825  VITAMINB12 996*  FOLATE 15.7  FERRITIN 135  TIBC 300  IRON 28*  RETICCTPCT 1.4   Urine analysis:    Component Value Date/Time   COLORURINE YELLOW 05/21/2019 1724   APPEARANCEUR CLEAR 05/21/2019 1724   LABSPEC 1.012 05/21/2019 1724   PHURINE 5.0 05/21/2019 1724   GLUCOSEU NEGATIVE 05/21/2019 1724   HGBUR MODERATE (A) 05/21/2019  1724   BILIRUBINUR NEGATIVE 05/21/2019 1724   KETONESUR NEGATIVE 05/21/2019 1724   PROTEINUR 100 (A) 05/21/2019 1724   UROBILINOGEN 0.2 09/02/2009 0935   NITRITE NEGATIVE 05/21/2019 1724   LEUKOCYTESUR NEGATIVE 05/21/2019 1724   Sepsis Labs: Invalid input(s): PROCALCITONIN, [place]  Microbiology: No results found for this or any previous visit (from the past 240 hour(s)).  Radiology Studies: No results found.  Total time spent 32 minutes Darliss Cheney, MD [facility]  If 7PM-7AM, please contact night-coverage [email or web address] Password [facility] 05/26/2019, 9:43 AM

## 2019-05-26 NOTE — Evaluation (Signed)
Occupational Therapy Evaluation Patient Details Name: Joshua Massey MRN: [id number] DOB: 04-05-1976 Today's Date: 05/26/2019    History of Present Illness Pt is a 43 year old male with history of DM-2, HTN, CKD-4, lower extremity diabetic wound followed by Dr. Sharol Given, paroxysmal A. fib on Eliquis, combined CHF presented with shortness of breath and edema in the setting of doses of diuretics and dietary indiscretion, and admitted for acute on chronic CHF.    Clinical Impression   Pt PTA: Pt living at home with spouse and children. Pt reports independence; using reacher for LB dressing and often wore slip on shoes (no socks). Pt was using SPC for mobility. Pt currently limited by decreased activity tolerance due to increased edema in BLEs. Pt performing transfers and ADL functional mobility with no AD and increased time.  Pt stood at sink x10 mnins for ADL routine wiith no limitations noted.  Pt tolerating session well. No pain reported. VSS. Pt would benefit from 3in1 wider adult [facility] for use as shower chair in addition to [facility]. Pt close to functional baseline and family can assist with LB needs at home. OT signing off.    Follow Up Recommendations  No OT follow up    Equipment Recommendations  3 in 1 bedside commode(wider adult 3in1)    Recommendations for Other Services       Precautions / Restrictions Precautions Precautions: Fall Restrictions Weight Bearing Restrictions: No      Mobility Bed Mobility Overal bed mobility: Needs Assistance Bed Mobility: Supine to Sit     Supine to sit: Min guard     General bed mobility comments: Increased time/effort with HOB elevated and use of bed rail  Transfers Overall transfer level: Needs assistance Equipment used: None Transfers: Sit to/from Stand Sit to Stand: Min guard         General transfer comment: use of momentum    Balance Overall balance assessment: Needs assistance   Sitting balance-Leahy Scale: Good      Standing balance support: No upper extremity supported;During functional activity Standing balance-Leahy Scale: Fair                             ADL either performed or assessed with clinical judgement   ADL Overall ADL's : At baseline                                       General ADL Comments: Pt uses reacher at home for LB dressing and reports not usually wearing socks, but family can assist. Pt stood at sink x10 mnins for ADL routine wiith no limitations noted.      Vision Baseline Vision/History: No visual deficits Vision Assessment?: No apparent visual deficits     Perception     Praxis      Pertinent Vitals/Pain Pain Assessment: No/denies pain     Hand Dominance Right   Extremity/Trunk Assessment Upper Extremity Assessment Upper Extremity Assessment: Overall WFL for tasks assessed   Lower Extremity Assessment Lower Extremity Assessment: Generalized weakness;RLE deficits/detail;LLE deficits/detail RLE Deficits / Details: edema noted; BLEs wrapped. LLE Deficits / Details: edema noted; BLEs wrapped.   Cervical / Trunk Assessment Cervical / Trunk Assessment: Other exceptions Cervical / Trunk Exceptions: increased body habitus   Communication Communication Communication: No difficulties   Cognition Arousal/Alertness: Awake/alert Behavior During Therapy: WFL for tasks assessed/performed Overall Cognitive Status: Within  Functional Limits for tasks assessed                                     General Comments       Exercises     Shoulder Instructions      Home Living Family/patient expects to be discharged to:: Private residence Living Arrangements: Children;Spouse/significant other Available Help at Discharge: Family;Available PRN/intermittently Type of Home: Apartment Home Access: Stairs to enter Entrance Stairs-Number of Steps: 2 Entrance Stairs-Rails: Right Home Layout: One level     Bathroom Shower/Tub:  Teacher, early years/pre: Standard     Home Equipment: Cane - single point;Toilet riser;Adaptive equipment Adaptive Equipment: Reacher        Prior Functioning/Environment Level of Independence: Independent with assistive device(s)        Comments: uses cane; pt reports using Reacher for LB dressing        OT Problem List: Decreased activity tolerance      OT Treatment/Interventions:      OT Goals(Current goals can be found in the care plan section) Acute Rehab OT Goals Patient Stated Goal: "get this fluid off of me." OT Goal Formulation: With patient  OT Frequency:     Barriers to D/C:            Co-evaluation              AM-PAC OT "6 Clicks" Daily Activity     Outcome Measure Help from another person eating meals?: None Help from another person taking care of personal grooming?: None Help from another person toileting, which includes using toliet, bedpan, or urinal?: None Help from another person bathing (including washing, rinsing, drying)?: A Little Help from another person to put on and taking off regular upper body clothing?: None Help from another person to put on and taking off regular lower body clothing?: A Little 6 Click Score: 22   End of Session Nurse Communication: Mobility status  Activity Tolerance: Patient tolerated treatment well Patient left: in bed;with call bell/phone within reach  OT Visit Diagnosis: Unsteadiness on feet (R26.81);Muscle weakness (generalized) (M62.81)                Time: [id number] OT Time Calculation (min): 25 min Charges:  OT General Charges $OT Visit: 1 Visit OT Evaluation $OT Eval Moderate Complexity: 1 Mod OT Treatments $Self Care/Home Management : 8-22 mins  Ebony Hail Harold Hedge) Marsa Aris OTR/L Acute Rehabilitation Services Pager: [phone] Office: [place] 05/26/2019, 2:19 PM

## 2019-05-26 NOTE — Progress Notes (Signed)
  Diuresing well. Vitals look good. Rhythm stable. Creatinine stable at 4.4. Renal managing diuretics.   I have nothing to add to management today.   HF team will see again in am.   Glori Bickers, MD  11:37 AM

## 2019-05-26 NOTE — Progress Notes (Signed)
Patient ID: Joshua Massey, male   DOB: 11/11/1975, 43 y.o.   MRN: [id number] [facility] Progress Note   Assessment/ Plan:   1. Acute kidney Injury on chronic kidney disease stage IV:  Likely secondary to chronic cardiorenal syndrome exacerbated by recent decompensation.  He continues to maintain good urine output with net negative fluid balance again overnight; will increase metolazone today to 5 mg to complement ongoing furosemide drip of 12 mg/hour.  Reeducated regarding sodium restriction/adherence. 2.  Acute exacerbation of chronic diastolic/systolic congestive heart failure: Secondary to nonadherence with medications/diet-remains volume overloaded and responding well to diuretics.  Per patient, apparently still 48 pounds over his dry weight. 3.  Atrial flutter: Adequately rate controlled on carvedilol/amiodarone, on anticoagulation with Eliquis. 4.  Hypertension: Blood pressure under acceptable control  Subjective:   Reports that he continues to feel better with regards to shortness of breath.  Wants to try and ambulate.   Objective:   BP 125/82 (BP Location: Right Arm)   Pulse 76   Temp 97.7 F (36.5 C) (Oral)   Resp 16   Ht 5\' 11"  (1.803 m)   Wt (!) 157.9 kg   SpO2 100%   BMI 48.54 kg/m   Intake/Output Summary (Last 24 hours) at 05/26/2019 0913 Last data filed at 05/26/2019 0504 Gross per 24 hour  Intake 536.53 ml  Output 2375 ml  Net -1838.47 ml   Weight change: -2.858 kg  Physical Exam: Gen: Sitting up on the side of his bed, watching videos on phone CVS: Pulse regular rhythm, normal rate, S1 and S2 normal Resp: Distant breath sounds bilaterally, no distinct rales or rhonchi Abd: Soft, obese, nontender Ext: Bilateral Ace wraps, 1-2+ lower extremity edema  Imaging: No results found.  Labs: BMET Recent Labs  Lab 05/21/19 1500 05/22/19 0301 05/23/19 0339 05/24/19 0246 05/25/19 0235 05/26/19 0310  NA 143 141 142 143 143 142  K 4.6 4.5 4.2 4.1  4.1 4.1  CL 106 108 110 107 108 105  CO2 23 21* 21* 22 23 22   GLUCOSE 112* 91 111* 109* 97 85  BUN 94* 95* 92* 91* 92* 91*  CREATININE 4.95* 4.79* 4.70* 4.56* 4.48* 4.36*  CALCIUM 8.4* 8.3* 8.5* 8.6* 8.6* 8.6*  PHOS  --   --  4.9* 4.4 4.4 4.6   CBC Recent Labs  Lab 05/21/19 1500 05/24/19 0246 05/25/19 0235 05/26/19 0310  WBC 5.0 6.0 5.8 5.8  HGB 11.5* 11.0* 11.3* 11.2*  HCT 37.1* 34.0* 34.4* 35.0*  MCV 96.6 93.4 92.2 94.9  PLT 181 170 182 182    Medications:    . amiodarone  200 mg Oral Daily  . apixaban  5 mg Oral BID  . atorvastatin  10 mg Oral Daily  . carvedilol  25 mg Oral BID WC  . ferrous sulfate  325 mg Oral BID  . influenza vac split quadrivalent PF  0.5 mL Intramuscular Tomorrow-1000  . insulin aspart  0-20 Units Subcutaneous TID WC  . insulin glargine  1 Units Subcutaneous QHS  . isosorbide-hydrALAZINE  1 tablet Oral TID  . metolazone  5 mg Oral Once  . potassium chloride  10 mEq Oral Daily  . sodium chloride flush  3 mL Intravenous Q12H   Elmarie Shiley, MD 05/26/2019, 9:13 AM

## 2019-05-27 DIAGNOSIS — I5023 Acute on chronic systolic (congestive) heart failure: Secondary | ICD-10-CM | POA: Diagnosis not present

## 2019-05-27 LAB — RENAL FUNCTION PANEL
Albumin: 2.5 g/dL — ABNORMAL LOW (ref 3.5–5.0)
Anion gap: 12 (ref 5–15)
BUN: 91 mg/dL — ABNORMAL HIGH (ref 6–20)
CO2: 24 mmol/L (ref 22–32)
Calcium: 8.6 mg/dL — ABNORMAL LOW (ref 8.9–10.3)
Chloride: 105 mmol/L (ref 98–111)
Creatinine, Ser: 4.37 mg/dL — ABNORMAL HIGH (ref 0.61–1.24)
GFR calc Af Amer: 18 mL/min — ABNORMAL LOW (ref >60)
GFR calc non Af Amer: 16 mL/min — ABNORMAL LOW (ref >60)
Glucose, Bld: 97 mg/dL (ref 70–99)
Phosphorus: 4.8 mg/dL — ABNORMAL HIGH (ref 2.5–4.6)
Potassium: 4 mmol/L (ref 3.5–5.1)
Sodium: 141 mmol/L (ref 135–145)

## 2019-05-27 LAB — CBC
HCT: 32.6 % — ABNORMAL LOW (ref 39.0–52.0)
Hemoglobin: 10.5 g/dL — ABNORMAL LOW (ref 13.0–17.0)
MCH: 30 pg (ref 26.0–34.0)
MCHC: 32.2 g/dL (ref 30.0–36.0)
MCV: 93.1 fL (ref 80.0–100.0)
Platelets: 176 10*3/uL (ref 150–400)
RBC: 3.5 MIL/uL — ABNORMAL LOW (ref 4.22–5.81)
RDW: 16.9 % — ABNORMAL HIGH (ref 11.5–15.5)
WBC: 5.3 10*3/uL (ref 4.0–10.5)
nRBC: 0 % (ref 0.0–0.2)

## 2019-05-27 LAB — GLUCOSE, CAPILLARY
Glucose-Capillary: 86 mg/dL (ref 70–99)
Glucose-Capillary: 129 mg/dL — ABNORMAL HIGH (ref 70–99)
Glucose-Capillary: 118 mg/dL — ABNORMAL HIGH (ref 70–99)
Glucose-Capillary: 119 mg/dL — ABNORMAL HIGH (ref 70–99)

## 2019-05-27 LAB — MAGNESIUM: Magnesium: 2.3 mg/dL (ref 1.7–2.4)

## 2019-05-27 LAB — BRAIN NATRIURETIC PEPTIDE: B Natriuretic Peptide: 1403.3 pg/mL — ABNORMAL HIGH (ref 0.0–100.0)

## 2019-05-27 MED ORDER — METOLAZONE 5 MG PO TABS
5.0000 mg | ORAL_TABLET | Freq: Once | ORAL | Status: AC
  Administered 2019-05-27: 10:00:00 5 mg via ORAL
  Filled 2019-05-27: qty 1

## 2019-05-27 MED ORDER — ISOSORB DINITRATE-HYDRALAZINE 20-37.5 MG PO TABS
1.5000 | ORAL_TABLET | Freq: Three times a day (TID) | ORAL | Status: DC
  Administered 2019-05-27 – 2019-05-28 (×3): 1.5 via ORAL
  Filled 2019-05-27 (×3): qty 2

## 2019-05-27 NOTE — Progress Notes (Signed)
CARDIAC REHAB PHASE I   PRE:  Rate/Rhythm: 73 SR  BP:  Supine:   Sitting: 133/94  Standing:    SaO2: 95%RA  MODE:  Ambulation: 80 ft   POST:  Rate/Rhythm: 87 nSR  BP:  Supine:   Sitting: 142/91  Standing:    SaO2: 86%- 93% RA 0904-0930 Pt walked 80 ft on RA with asst x 2 with rolling walker and asst x2. Pt has wide stance when walking and a little wobbly. Used rolling walker. Stated he has cane and knee scooter at home. Some DOE noted but he stated breathing better. Gave CHF booklet and low sodium diets. Pt knew when to call with daily weights. Encouraged him to read materials and we will review next visit. To sitting on side of bed and set up breakfast.   Graylon Good, RN BSN  05/27/2019 9:24 AM

## 2019-05-27 NOTE — Progress Notes (Addendum)
Advanced Heart Failure Rounding Note  PCP-Cardiologist: Dr. Aundra Dubin   Subjective:    Remains on lasix drip. Weight down another 5 pounds from yesterday. Overall, down 19 lb since admit.   Creatinine has been trending down and stable over the last 24 hr at 4.37 (4.95 on admit)  No cardiac complaints. Denies dyspnea.   Objective:   Weight Range: (!) 155.9 kg Body mass index is 47.92 kg/m.   Vital Signs:   Temp:  [97.7 F (36.5 C)-98.5 F (36.9 C)] 98.3 F (36.8 C) (12/07 0457) Pulse Rate:  [69-83] 72 (12/07 0500) Resp:  [11-18] 18 (12/07 0500) BP: (125-135)/(82-95) 133/93 (12/07 0500) SpO2:  [94 %-100 %] 96 % (12/07 0500) Weight:  [155.9 kg] 155.9 kg (12/07 0457) Last BM Date: 05/26/19  Weight change: Filed Weights   05/25/19 0225 05/26/19 0455 05/27/19 0457  Weight: (!) 160.7 kg (!) 157.9 kg (!) 155.9 kg    Intake/Output:   Intake/Output Summary (Last 24 hours) at 05/27/2019 0741 Last data filed at 05/27/2019 0600 Gross per 24 hour  Intake 1056.99 ml  Output 3075 ml  Net -2018.01 ml      Physical Exam    PHYSICAL EXAM:  General:  Obese AAM. No respiratory difficulty HEENT: normal Neck: supple. no JVD. Carotids 2+ bilat; no bruits. No lymphadenopathy or thyromegaly appreciated. Cor: PMI nondisplaced. Regular rate & rhythm. No rubs, gallops or murmurs. Lungs: clear Abdomen: soft, nontender, nondistended. No hepatosplenomegaly. No bruits or masses. Good bowel sounds. Extremities: no cyanosis, clubbing, rash, bilateral LEE edema, bilateral unna boots  Neuro: alert & oriented x 3, cranial nerves grossly intact. moves all 4 extremities w/o difficulty. Affect pleasant.   Telemetry   NSR low to mid 70s personally reviewed.   EKG    N/a   Labs    CBC Recent Labs    05/26/19 0310 05/27/19 0242  WBC 5.8 5.3  HGB 11.2* 10.5*  HCT 35.0* 32.6*  MCV 94.9 93.1  PLT 182 0000000   Basic Metabolic Panel Recent Labs    05/26/19 0310 05/27/19 0242  NA 142  141  K 4.1 4.0  CL 105 105  CO2 22 24  GLUCOSE 85 97  BUN 91* 91*  CREATININE 4.36* 4.37*  CALCIUM 8.6* 8.6*  MG 2.3 2.3  PHOS 4.6 4.8*   Liver Function Tests Recent Labs    05/26/19 0310 05/27/19 0242  ALBUMIN 2.5* 2.5*   No results for input(s): LIPASE, AMYLASE in the last 72 hours. Cardiac Enzymes No results for input(s): CKTOTAL, CKMB, CKMBINDEX, TROPONINI in the last 72 hours.  BNP: BNP (last 3 results) Recent Labs    05/25/19 0235 05/26/19 0310 05/27/19 0242  BNP 1,682.3* 1,444.0* 1,403.3*    ProBNP (last 3 results) No results for input(s): PROBNP in the last 8760 hours.   D-Dimer No results for input(s): DDIMER in the last 72 hours. Hemoglobin A1C No results for input(s): HGBA1C in the last 72 hours. Fasting Lipid Panel No results for input(s): CHOL, HDL, LDLCALC, TRIG, CHOLHDL, LDLDIRECT in the last 72 hours. Thyroid Function Tests No results for input(s): TSH, T4TOTAL, T3FREE, THYROIDAB in the last 72 hours.  Invalid input(s): FREET3  Other results:   Imaging    No results found.   Medications:     Scheduled Medications: . amiodarone  200 mg Oral Daily  . apixaban  5 mg Oral BID  . atorvastatin  10 mg Oral Daily  . carvedilol  25 mg Oral BID WC  .  ferrous sulfate  325 mg Oral BID  . influenza vac split quadrivalent PF  0.5 mL Intramuscular Tomorrow-1000  . insulin aspart  0-20 Units Subcutaneous TID WC  . isosorbide-hydrALAZINE  1 tablet Oral TID  . potassium chloride  10 mEq Oral Daily  . sodium chloride flush  3 mL Intravenous Q12H    Infusions: . sodium chloride    . furosemide (LASIX) infusion 12 mg/hr (05/26/19 2200)    PRN Medications: sodium chloride, acetaminophen, gabapentin, ondansetron (ZOFRAN) IV, oxyCODONE-acetaminophen, silver sulfADIAZINE, sodium chloride flush     Assessment/Plan  1. Acute on chronic systolic CHF: Echo in 0000000 showed EF 20-25% with mildly dilated LV, normal RV.  Suspected nonischemic  cardiomyopathy, has had low EF known since 2013, at that time had cath with nonobstructive CAD.  He ran out of his meds at home (torsemide in particular) and had significant dietary indiscretion around Thanksgiving.  Creatinine elevated at 4.95 from baseline in the 3's at admission, now down to 4.37 - Volume status improving. Wt down 19 lb since admit. Received dose of metolazone yesterday w/ lasix. K stable. Continue  Lasix gtt at 12 mg/hr. No PICC due to possible need for HD down the road.  - Continue Coreg.  - Continue Bidil 1 tab tid.  - No ACEI/ARNI/ARB/spironolactone with CKD stage IV.  2. Atrial flutter: Paroxysmal.  Maintaining NSR.  - Continue 200 mg amiodarone daily.  - Continue apixaban.  3. AKI on CKD stage IV: Baseline creatinine 3s. Suspect cardiorenal effect with marked volume overload.  Hopefully diuresis with lowering of renal venous pressure will help. Nephrology following.   Creatinine unchanged down to 4.37.   4. Type II diabetes: Poor control. management per primary team.    Length of Stay: [address], [place]  05/27/2019, 7:41 AM  Advanced Heart Failure Team Pager [phone] (M-F; 7a - 4p)  Please contact [facility] for night-coverage after hours (4p -7a ) and weekends on [email or web address]  Patient seen with PA, agree with the above note.   He continues to diurese well, creatinine stable at 4.37.  Weight is coming down.   General: NAD Neck: JVP 14-16 cm, no thyromegaly or thyroid nodule.  Lungs: Clear to auscultation bilaterally with normal respiratory effort. CV: Lateral PMI.  Heart regular S1/S2, no S3/S4, no murmur.  2+ edema to thighs.   Abdomen: Soft, nontender, no hepatosplenomegaly, no distention.  Skin: Intact without lesions or rashes.  Neurologic: Alert and oriented x 3.  Psych: Normal affect. Extremities: No clubbing or cyanosis.  HEENT: Normal.   Patient still has significant volume overload.  Creatinine remains stable at 4.37.  - Continue  Lasix gtt 12 mg/hr.  - Metolazone 5 mg x 1 again today.  - Increase Bidil to 1.5 tabs tid.   Loralie Champagne 05/27/2019 1:28 PM

## 2019-05-27 NOTE — Progress Notes (Signed)
Patient ID: Joshua Massey, male   DOB: October 17, 1975, 43 y.o.   MRN: [id number] [facility] Progress Note   Assessment/ Plan:   1. Acute kidney Injury on chronic kidney disease stage IV:  Likely secondary to chronic cardiorenal syndrome exacerbated by recent decompensation.  Maintains good urine output with ongoing diuresis-metolazone + furosemide drip; no changes at this time with stable renal function. 2.  Acute exacerbation of chronic diastolic/systolic congestive heart failure: Secondary to nonadherence with medications/diet-remains volume overloaded and responding well to diuretics.  Reports dry weight of around 300 pounds. 3.  Atrial flutter: Adequately rate controlled on carvedilol/amiodarone, on anticoagulation with Eliquis. 4.  Hypertension: Blood pressure under acceptable control  Subjective:   Shortness of breath continues to improve, denies any chest pain.   Objective:   BP (!) 133/93   Pulse 72   Temp 98.3 F (36.8 C) (Oral)   Resp 18   Ht 5\' 11"  (1.803 m)   Wt (!) 155.9 kg   SpO2 96%   BMI 47.92 kg/m   Intake/Output Summary (Last 24 hours) at 05/27/2019 0838 Last data filed at 05/27/2019 0600 Gross per 24 hour  Intake 806.99 ml  Output 3075 ml  Net -2268.01 ml   Weight change: -1.996 kg  Physical Exam: Gen: Resting comfortably in bed, watching television CVS: Pulse regular rhythm, normal rate, S1 and S2 normal Resp: Distant breath sounds bilaterally, no distinct rales or rhonchi Abd: Soft, obese, nontender Ext: Bilateral Ace wraps, 1-2+ lower extremity edema  Imaging: No results found.  Labs: BMET Recent Labs  Lab 05/21/19 1500 05/22/19 0301 05/23/19 0339 05/24/19 0246 05/25/19 0235 05/26/19 0310 05/27/19 0242  NA 143 141 142 143 143 142 141  K 4.6 4.5 4.2 4.1 4.1 4.1 4.0  CL 106 108 110 107 108 105 105  CO2 23 21* 21* 22 23 22 24   GLUCOSE 112* 91 111* 109* 97 85 97  BUN 94* 95* 92* 91* 92* 91* 91*  CREATININE 4.95* 4.79* 4.70*  4.56* 4.48* 4.36* 4.37*  CALCIUM 8.4* 8.3* 8.5* 8.6* 8.6* 8.6* 8.6*  PHOS  --   --  4.9* 4.4 4.4 4.6 4.8*   CBC Recent Labs  Lab 05/24/19 0246 05/25/19 0235 05/26/19 0310 05/27/19 0242  WBC 6.0 5.8 5.8 5.3  HGB 11.0* 11.3* 11.2* 10.5*  HCT 34.0* 34.4* 35.0* 32.6*  MCV 93.4 92.2 94.9 93.1  PLT 170 182 182 176    Medications:    . amiodarone  200 mg Oral Daily  . apixaban  5 mg Oral BID  . atorvastatin  10 mg Oral Daily  . carvedilol  25 mg Oral BID WC  . ferrous sulfate  325 mg Oral BID  . influenza vac split quadrivalent PF  0.5 mL Intramuscular Tomorrow-1000  . insulin aspart  0-20 Units Subcutaneous TID WC  . isosorbide-hydrALAZINE  1 tablet Oral TID  . metolazone  5 mg Oral Once  . potassium chloride  10 mEq Oral Daily  . sodium chloride flush  3 mL Intravenous Q12H   Elmarie Shiley, MD 05/27/2019, 8:38 AM

## 2019-05-27 NOTE — Progress Notes (Signed)
PROGRESS NOTE  Joshua Massey [id number] DOB: 1976-01-13   PCP: [name], [name], MD  Patient is from: Home.  Uses cane and walker at baseline.  DOA: 05/21/2019 LOS: 6  Brief Narrative / Interim history: 43 year old male with history of DM-2, HTN, CKD-4, lower extremity diabetic wound followed by Dr. Sharol Given, paroxysmal A. fib on Eliquis, combined CHF presented with shortness of breath and edema in the setting of missed doses of diuretics and dietary indiscretion, and admitted for acute on chronic CHF.  On IV Lasix drip per advanced heart failure.  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  LHC with nonobstructive CAD in 2013.  Ran out of his torsemide.  Also dietary indiscretion.  CXR consistent with CHF.  BNP elevated.  Started on Lasix drip-UOP 700 mL/ 24 hours.  Net> - 13.5 L.   Renal function is stable and in fact improving very slowly. -Advanced heart failure managing-continue Lasix drip and metolazone -Continue Coreg and BiDil -No ACEI/ARNI/ARB/Aldactone due to renal function -Appreciate dietitian input about dietary counseling -Counseled on the importance of compliance  Uncontrolled DM-2 with CKD-4: A1c 8.5% in 2019. CBG (last 3)  Recent Labs    05/25/19 1639 05/25/19 2109 05/26/19 0622  GLUCAP 124* 106* 95  -Blood sugar control.  Continue SSI.  AKI on CKD-4/azotemia: Cr 3.6-3.7 (baseline)> 4.95 (admit)>>> 4.48-likely due to cardiorenal syndrome.  Improving slowly with diuretics. -Nephrology on board -Continue monitoring on diuretics.  Anemia: Combination of ACD and IDA: Hgb 10-11 (baseline)> 11.5>> 11.3> 11.2> 10.5.  Anemia panel consistent with iron deficiency.  Stable. -IV ferric gluconate on 05/25/2019. -Continue p.o. ferrous sulfate with bowel regimen  Paroxysmal A. Fib/flutter: NSR -Amiodarone and Eliquis per cardiology  Essential hypertension: Normotensive -Cardiac meds as above  Physical  deconditioning: Reportedly uses cane.  -PT/OT eval  Morbid obesity: BMI 49.41. -Encourage lifestyle change to lose weight -Could benefit from newer diabetic agents with cardiovascular benefits -Appreciate dietitian input.  Lower extremity superficial wounds: POA.  Followed by Dr. Sharol Given. -Appreciate wound care input-"patient has superficial wounds to the right posterior achilles area that measures 2 cm x 2 cm and is 100% pink and clean.  The right LE lateral aspect as two small abrasion that are also 100% pink and clean.  The left dorsal foot has a superficial wound that measures 1.6 cm x 4 cm and is 100% clean and pink.  The patient states these started from compression stockings that rolled and pleated and caused pressure to the areas.   Patient states he has worn unna boots in the past and they worked well for him.  Recruitment consultant boots bilaterally, change weekly per WOC    DVT prophylaxis: On Eliquis for A. fib Code Status: Full code Family Communication: No family present.  Patient alert and oriented.  Discussed plan of care with him and he verbalized understanding. Disposition Plan: Remains inpatient for adequate diuresis Consultants: Advanced HF and nephrology  Subjective: Patient seen and examined.  He was walking with the physical therapy when I entered the room.  He looks comfortable.  According to him, he has no shortness of breath while at rest but he did have some shortness of breath when he was walking.  No other complaint.  Objective: Vitals:   05/26/19 2037 05/27/19 0000 05/27/19 0457 05/27/19 0500  BP: (!) 135/95  (!) 133/93 (!) 133/93  Pulse: 69  83 72  Resp: 13 11 18  18  Temp: 98.5 F (36.9 C)  98.3 F (36.8 C)   TempSrc: Oral  Oral   SpO2: 96%  94% 96%  Weight:   (!) 155.9 kg   Height:        Intake/Output Summary (Last 24 hours) at 05/27/2019 0920 Last data filed at 05/27/2019 0800 Gross per 24 hour  Intake 806.99 ml  Output 3775 ml  Net -2968.01 ml   Filed Weights    05/25/19 0225 05/26/19 0455 05/27/19 0457  Weight: (!) 160.7 kg (!) 157.9 kg (!) 155.9 kg    Examination:  General exam: Appears calm and comfortable, morbidly obese Respiratory system: Clear to auscultation. Respiratory effort normal. Cardiovascular system: S1 & S2 heard, RRR. No JVD, murmurs, rubs, gallops or clicks.  2+ pitting edema bilateral lower extremity Gastrointestinal system: Abdomen is nondistended, soft and nontender. No organomegaly or masses felt. Normal bowel sounds heard. Central nervous system: Alert and oriented. No focal neurological deficits. Extremities: Symmetric 5 x 5 power.  Psychiatry: Judgement and insight appear normal. Mood & affect appropriate.   Procedures:  None   Microbiology summarized: None  Sch Meds:  Scheduled Meds: . amiodarone  200 mg Oral Daily  . apixaban  5 mg Oral BID  . atorvastatin  10 mg Oral Daily  . carvedilol  25 mg Oral BID WC  . ferrous sulfate  325 mg Oral BID  . influenza vac split quadrivalent PF  0.5 mL Intramuscular Tomorrow-1000  . insulin aspart  0-20 Units Subcutaneous TID WC  . isosorbide-hydrALAZINE  1 tablet Oral TID  . metolazone  5 mg Oral Once  . potassium chloride  10 mEq Oral Daily  . sodium chloride flush  3 mL Intravenous Q12H   Continuous Infusions: . sodium chloride    . furosemide (LASIX) infusion 12 mg/hr (05/26/19 2200)   PRN Meds:.sodium chloride, acetaminophen, gabapentin, ondansetron (ZOFRAN) IV, oxyCODONE-acetaminophen, silver sulfADIAZINE, sodium chloride flush  Antimicrobials: Anti-infectives (From admission, onward)   None       I have personally reviewed the following labs and images: CBC: Recent Labs  Lab 05/21/19 1500 05/24/19 0246 05/25/19 0235 05/26/19 0310 05/27/19 0242  WBC 5.0 6.0 5.8 5.8 5.3  HGB 11.5* 11.0* 11.3* 11.2* 10.5*  HCT 37.1* 34.0* 34.4* 35.0* 32.6*  MCV 96.6 93.4 92.2 94.9 93.1  PLT 181 170 182 182 176   BMP &GFR Recent Labs  Lab 05/23/19 0339  05/24/19 0246 05/25/19 0235 05/26/19 0310 05/27/19 0242  NA 142 143 143 142 141  K 4.2 4.1 4.1 4.1 4.0  CL 110 107 108 105 105  CO2 21* 22 23 22 24   GLUCOSE 111* 109* 97 85 97  BUN 92* 91* 92* 91* 91*  CREATININE 4.70* 4.56* 4.48* 4.36* 4.37*  CALCIUM 8.5* 8.6* 8.6* 8.6* 8.6*  MG  --   --   --  2.3 2.3  PHOS 4.9* 4.4 4.4 4.6 4.8*   Estimated Creatinine Clearance: 33.5 mL/min (A) (by C-G formula based on SCr of 4.37 mg/dL (H)). Liver & Pancreas: Recent Labs  Lab 05/21/19 1651 05/23/19 0339 05/24/19 0246 05/25/19 0235 05/26/19 0310 05/27/19 0242  AST 20  --   --   --   --   --   ALT 16  --   --   --   --   --   ALKPHOS 79  --   --   --   --   --   BILITOT 2.0*  --   --   --   --   --  PROT 7.3  --   --   --   --   --   ALBUMIN 2.8* 2.6* 2.5* 2.6* 2.5* 2.5*   No results for input(s): LIPASE, AMYLASE in the last 168 hours. No results for input(s): AMMONIA in the last 168 hours. Diabetic: No results for input(s): HGBA1C in the last 72 hours. Recent Labs  Lab 05/26/19 0622 05/26/19 1200 05/26/19 1610 05/26/19 2140 05/27/19 0620  GLUCAP 95 89 106* 110* 86   Cardiac Enzymes: No results for input(s): CKTOTAL, CKMB, CKMBINDEX, TROPONINI in the last 168 hours. No results for input(s): PROBNP in the last 8760 hours. Coagulation Profile: No results for input(s): INR, PROTIME in the last 168 hours. Thyroid Function Tests: No results for input(s): TSH, T4TOTAL, FREET4, T3FREE, THYROIDAB in the last 72 hours. Lipid Profile: No results for input(s): CHOL, HDL, LDLCALC, TRIG, CHOLHDL, LDLDIRECT in the last 72 hours. Anemia Panel: Recent Labs    05/25/19 0825  VITAMINB12 996*  FOLATE 15.7  FERRITIN 135  TIBC 300  IRON 28*  RETICCTPCT 1.4   Urine analysis:    Component Value Date/Time   COLORURINE YELLOW 05/21/2019 1724   APPEARANCEUR CLEAR 05/21/2019 1724   LABSPEC 1.012 05/21/2019 1724   PHURINE 5.0 05/21/2019 1724   GLUCOSEU NEGATIVE 05/21/2019 1724   HGBUR  MODERATE (A) 05/21/2019 1724   BILIRUBINUR NEGATIVE 05/21/2019 1724   KETONESUR NEGATIVE 05/21/2019 1724   PROTEINUR 100 (A) 05/21/2019 1724   UROBILINOGEN 0.2 09/02/2009 0935   NITRITE NEGATIVE 05/21/2019 1724   LEUKOCYTESUR NEGATIVE 05/21/2019 1724   Sepsis Labs: Invalid input(s): PROCALCITONIN, [place]  Microbiology: No results found for this or any previous visit (from the past 240 hour(s)).  Radiology Studies: No results found.  Total time spent 28 minutes Darliss Cheney, MD [facility]  If 7PM-7AM, please contact night-coverage [email or web address] Password [email or web address] 05/27/2019, 9:20 AM

## 2019-05-28 DIAGNOSIS — R5381 Other malaise: Secondary | ICD-10-CM | POA: Diagnosis present

## 2019-05-28 DIAGNOSIS — I5023 Acute on chronic systolic (congestive) heart failure: Secondary | ICD-10-CM | POA: Diagnosis not present

## 2019-05-28 LAB — CBC
HCT: 32.9 % — ABNORMAL LOW (ref 39.0–52.0)
Hemoglobin: 10.7 g/dL — ABNORMAL LOW (ref 13.0–17.0)
MCH: 30 pg (ref 26.0–34.0)
MCHC: 32.5 g/dL (ref 30.0–36.0)
MCV: 92.2 fL (ref 80.0–100.0)
Platelets: 183 10*3/uL (ref 150–400)
RBC: 3.57 MIL/uL — ABNORMAL LOW (ref 4.22–5.81)
RDW: 16.6 % — ABNORMAL HIGH (ref 11.5–15.5)
WBC: 4.8 10*3/uL (ref 4.0–10.5)
nRBC: 0 % (ref 0.0–0.2)

## 2019-05-28 LAB — RENAL FUNCTION PANEL
Albumin: 2.4 g/dL — ABNORMAL LOW (ref 3.5–5.0)
Anion gap: 11 (ref 5–15)
BUN: 92 mg/dL — ABNORMAL HIGH (ref 6–20)
CO2: 25 mmol/L (ref 22–32)
Calcium: 8.5 mg/dL — ABNORMAL LOW (ref 8.9–10.3)
Chloride: 104 mmol/L (ref 98–111)
Creatinine, Ser: 4.36 mg/dL — ABNORMAL HIGH (ref 0.61–1.24)
GFR calc Af Amer: 18 mL/min — ABNORMAL LOW (ref >60)
GFR calc non Af Amer: 16 mL/min — ABNORMAL LOW (ref >60)
Glucose, Bld: 105 mg/dL — ABNORMAL HIGH (ref 70–99)
Phosphorus: 4.7 mg/dL — ABNORMAL HIGH (ref 2.5–4.6)
Potassium: 4 mmol/L (ref 3.5–5.1)
Sodium: 140 mmol/L (ref 135–145)

## 2019-05-28 LAB — GLUCOSE, CAPILLARY
Glucose-Capillary: 107 mg/dL — ABNORMAL HIGH (ref 70–99)
Glucose-Capillary: 90 mg/dL (ref 70–99)
Glucose-Capillary: 111 mg/dL — ABNORMAL HIGH (ref 70–99)
Glucose-Capillary: 134 mg/dL — ABNORMAL HIGH (ref 70–99)

## 2019-05-28 LAB — MAGNESIUM: Magnesium: 2.1 mg/dL (ref 1.7–2.4)

## 2019-05-28 MED ORDER — ISOSORB DINITRATE-HYDRALAZINE 20-37.5 MG PO TABS
2.0000 | ORAL_TABLET | Freq: Three times a day (TID) | ORAL | Status: DC
  Administered 2019-05-28 – 2019-06-04 (×21): 2 via ORAL
  Filled 2019-05-28 (×21): qty 2

## 2019-05-28 MED ORDER — METOLAZONE 5 MG PO TABS
5.0000 mg | ORAL_TABLET | Freq: Once | ORAL | Status: AC
  Administered 2019-05-28: 09:00:00 5 mg via ORAL
  Filled 2019-05-28: qty 1

## 2019-05-28 NOTE — Progress Notes (Signed)
Patient ID: Joshua Massey, male   DOB: [birth year], 43 y.o.   MRN: [id number] [facility] Progress Note   Assessment/ Plan:   1. Acute kidney Injury on chronic kidney disease stage IV:  Likely secondary to chronic cardiorenal syndrome exacerbated by recent decompensation.  She continues to show with good diuretic response to ongoing furosemide drip and metolazone; the latter requiring intermittent redosing. 2.  Acute exacerbation of chronic diastolic/systolic congestive heart failure: Secondary to nonadherence with medications/diet-remains volume overloaded and responding well to diuretics.  Reports dry weight of around 300 pounds. 3.  Atrial flutter: Adequately rate controlled on carvedilol/amiodarone, on anticoagulation with Eliquis. 4.  Hypertension: Blood pressure under acceptable control  Subjective:   Denies any acute events overnight, getting ready to work with physical therapy.   Objective:   BP (!) 139/94 (BP Location: Left Arm)   Pulse 68   Temp 97.9 F (36.6 C) (Oral)   Resp 12   Ht 5\' 11"  (1.803 m)   Wt (!) 153.4 kg   SpO2 98%   BMI 47.17 kg/m   Intake/Output Summary (Last 24 hours) at 05/28/2019 0845 Last data filed at 05/28/2019 [id number] Gross per 24 hour  Intake 203.73 ml  Output 2750 ml  Net -2546.27 ml   Weight change: -2.456 kg  Physical Exam: Gen: Resting in bed, getting ready to work with PT CVS: Pulse regular rhythm, normal rate, S1 and S2 normal Resp: Distant breath sounds bilaterally, no distinct rales or rhonchi Abd: Soft, obese, nontender Ext: Bilateral Ace wraps, 1-2+ lower extremity edema  Imaging: No results found.  Labs: BMET Recent Labs  Lab 05/22/19 0301 05/23/19 0339 05/24/19 0246 05/25/19 0235 05/26/19 0310 05/27/19 0242 05/28/19 0256  NA 141 142 143 143 142 141 140  K 4.5 4.2 4.1 4.1 4.1 4.0 4.0  CL 108 110 107 108 105 105 104  CO2 21* 21* 22 23 22 24 25   GLUCOSE 91 111* 109* 97 85 97 105*  BUN 95* 92* 91* 92* 91*  91* 92*  CREATININE 4.79* 4.70* 4.56* 4.48* 4.36* 4.37* 4.36*  CALCIUM 8.3* 8.5* 8.6* 8.6* 8.6* 8.6* 8.5*  PHOS  --  4.9* 4.4 4.4 4.6 4.8* 4.7*   CBC Recent Labs  Lab 05/25/19 0235 05/26/19 0310 05/27/19 0242 05/28/19 0256  WBC 5.8 5.8 5.3 4.8  HGB 11.3* 11.2* 10.5* 10.7*  HCT 34.4* 35.0* 32.6* 32.9*  MCV 92.2 94.9 93.1 92.2  PLT 182 182 176 183    Medications:    . amiodarone  200 mg Oral Daily  . apixaban  5 mg Oral BID  . atorvastatin  10 mg Oral Daily  . carvedilol  25 mg Oral BID WC  . ferrous sulfate  325 mg Oral BID  . influenza vac split quadrivalent PF  0.5 mL Intramuscular Tomorrow-1000  . insulin aspart  0-20 Units Subcutaneous TID WC  . isosorbide-hydrALAZINE  1.5 tablet Oral TID  . potassium chloride  10 mEq Oral Daily  . sodium chloride flush  3 mL Intravenous Q12H   Elmarie Shiley, MD 05/28/2019, 8:45 AM

## 2019-05-28 NOTE — Progress Notes (Signed)
Physical Therapy Treatment Patient Details Name: Joshua Massey MRN: [id number] DOB: 24-Aug-1975 Today's Date: 05/28/2019    History of Present Illness Pt is a 43 year old male with history of DM-2, HTN, CKD-4, lower extremity diabetic wound followed by Dr. Sharol Given, paroxysmal A. fib on Eliquis, combined CHF presented with shortness of breath and edema in the setting of doses of diuretics and dietary indiscretion, and admitted for acute on chronic CHF.     PT Comments    Patient is making progress toward PT goals and tolerated increased mobility well. Min guard for safety with gait training and pt holding onto IV pole to steady. VSS on RA. Current plan remains appropriate.    Follow Up Recommendations  Home health PT;Supervision for mobility/OOB     Equipment Recommendations  Rolling walker with 5" wheels(bariatric)    Recommendations for Other Services       Precautions / Restrictions Precautions Precautions: Fall    Mobility  Bed Mobility Overal bed mobility: Modified Independent             General bed mobility comments: use of rail and HOB elevated  Transfers Overall transfer level: Needs assistance Equipment used: None Transfers: Sit to/from Stand Sit to Stand: Supervision         General transfer comment: supervision for safety  Ambulation/Gait Ambulation/Gait assistance: Min guard Gait Distance (Feet): 200 Feet Assistive device: IV Pole Gait Pattern/deviations: Step-through pattern;Wide base of support Gait velocity: decreased   General Gait Details: min guard for safety; use of IV pole to steady; wide BOS and lateral sway   Stairs             Wheelchair Mobility    Modified Rankin (Stroke Patients Only)       Balance Overall balance assessment: Needs assistance Sitting-balance support: Feet supported Sitting balance-Leahy Scale: Good     Standing balance support: No upper extremity supported;During functional activity Standing  balance-Leahy Scale: Fair Standing balance comment: pt is able to static stand without UE support                             Cognition Arousal/Alertness: Awake/alert Behavior During Therapy: WFL for tasks assessed/performed Overall Cognitive Status: Within Functional Limits for tasks assessed                                        Exercises      General Comments        Pertinent Vitals/Pain Pain Assessment: No/denies pain    Home Living                      Prior Function            PT Goals (current goals can now be found in the care plan section) Progress towards PT goals: Progressing toward goals    Frequency    Min 3X/week      PT Plan Current plan remains appropriate    Co-evaluation              AM-PAC PT "6 Clicks" Mobility   Outcome Measure  Help needed turning from your back to your side while in a flat bed without using bedrails?: None Help needed moving from lying on your back to sitting on the side of a flat bed without using bedrails?: A Little Help needed  moving to and from a bed to a chair (including a wheelchair)?: A Little Help needed standing up from a chair using your arms (e.g., wheelchair or bedside chair)?: A Little Help needed to walk in hospital room?: A Little Help needed climbing 3-5 steps with a railing? : A Lot 6 Click Score: 18    End of Session Equipment Utilized During Treatment: Gait belt Activity Tolerance: Patient tolerated treatment well Patient left: with call bell/phone within reach(pt sitting EOB) Nurse Communication: Mobility status PT Visit Diagnosis: Unsteadiness on feet (R26.81);Muscle weakness (generalized) (M62.81);Difficulty in walking, not elsewhere classified (R26.2)     Time: [id number] PT Time Calculation (min) (ACUTE ONLY): 23 min  Charges:  $Gait Training: 23-37 mins                     Earney Navy, PTA Acute Rehabilitation Services Pager: [phone] Office: [phone]     Darliss Cheney 05/28/2019, 1:41 PM

## 2019-05-28 NOTE — Progress Notes (Signed)
CARDIAC REHAB PHASE I   PRE:  Rate/Rhythm: 81 SR with PVCs    BP: sitting 130/89    SaO2: 94 RA  MODE:  Ambulation: 240 ft   POST:  Rate/Rhythm: 82 SR    BP: sitting 127/80     SaO2: 91-92 RA  Pt able to stand and walk with wide RW. Fairly steady, several rest stops due to SOB and legs being tight. Increased distance. VSS, to EOB. Sts he plans to read materials tonight. [place], ACSM 05/28/2019 1:18 PM

## 2019-05-28 NOTE — Progress Notes (Signed)
PROGRESS NOTE  Joshua Massey [id number] DOB: 10/28/1975   PCP: [name], [name], MD  Patient is from: Home.  Uses cane and walker at baseline.  DOA: 05/21/2019 LOS: 7  Brief Narrative / Interim history: 43 year old male with history of DM-2, HTN, CKD-4, lower extremity diabetic wound followed by Dr. Sharol Given, paroxysmal A. fib on Eliquis, combined CHF presented with shortness of breath and edema in the setting of missed doses of diuretics and dietary indiscretion, and admitted for acute on chronic CHF.  On IV Lasix drip per advanced heart failure.  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  LHC with nonobstructive CAD in 2013.  Ran out of his torsemide.  Also dietary indiscretion.  CXR consistent with CHF.  BNP elevated.  Started on Lasix drip-UOP 700 mL/ 24 hours.  Net> - 13.5 L.   Renal function is stable and in fact improving very slowly. -Advanced heart failure managing-continue Lasix drip and metolazone -Continue Coreg and BiDil -No ACEI/ARNI/ARB/Aldactone due to renal function -Appreciate dietitian input about dietary counseling -Counseled on the importance of compliance  Uncontrolled DM-2 with CKD-4: A1c 8.5% in 2019. CBG (last 3)  Recent Labs    05/25/19 1639 05/25/19 2109 05/26/19 0622  GLUCAP 124* 106* 95  -Blood sugar control.  Continue SSI.  AKI on CKD-4/azotemia: Cr 3.6-3.7 (baseline)> 4.95 (admit)>>> 4.48> 4.36> 4.37> 4.36-likely due to cardiorenal syndrome.  Has plateaued now. -Nephrology on board, appreciate their help and management deferred to them.  Anemia: Combination of ACD and IDA: Hgb 10-11 (baseline)> 11.5>> 11.3> 11.2> 10.5.  Anemia panel consistent with iron deficiency.  Stable. -IV ferric gluconate on 05/25/2019. -Continue p.o. ferrous sulfate with bowel regimen  Paroxysmal A. Fib/flutter: NSR -Amiodarone and Eliquis per cardiology  Essential hypertension: Normotensive -Cardiac meds as  above  Physical deconditioning: Reportedly uses cane.  -PT/OT eval  Morbid obesity: BMI 49.41. -Encourage lifestyle change to lose weight -Could benefit from newer diabetic agents with cardiovascular benefits -Appreciate dietitian input.  Lower extremity superficial wounds: POA.  Followed by Dr. Sharol Given. -Appreciate wound care input-"patient has superficial wounds to the right posterior achilles area that measures 2 cm x 2 cm and is 100% pink and clean.  The right LE lateral aspect as two small abrasion that are also 100% pink and clean.  The left dorsal foot has a superficial wound that measures 1.6 cm x 4 cm and is 100% clean and pink.  The patient states these started from compression stockings that rolled and pleated and caused pressure to the areas.   Patient states he has worn unna boots in the past and they worked well for him.  Recruitment consultant boots bilaterally, change weekly per WOC    DVT prophylaxis: On Eliquis for A. fib Code Status: Full code Family Communication: No family present.  Patient alert and oriented.  Discussed plan of care with him and he verbalized understanding. Disposition Plan: Remains inpatient for adequate diuresis Consultants: Advanced HF and nephrology  Subjective: Seen and examined.  Feels better.  No more shortness of breath.  Objective: Vitals:   05/27/19 1211 05/27/19 1955 05/28/19 0551 05/28/19 0745  BP: (!) 138/95 114/79 129/90 (!) 139/94  Pulse: 75 70  68  Resp: 16 16 18 12   Temp: 98.6 F (37 C) 98.3 F (36.8 C) 97.7 F (36.5 C) 97.9 F (36.6 C)  TempSrc: Oral Oral Oral Oral  SpO2: 91% 91%  98%  Weight:   Marland Kitchen)  153.4 kg   Height:        Intake/Output Summary (Last 24 hours) at 05/28/2019 0828 Last data filed at 05/28/2019 [id number] Gross per 24 hour  Intake 203.73 ml  Output 2750 ml  Net -2546.27 ml   Filed Weights   05/26/19 0455 05/27/19 0457 05/28/19 0551  Weight: (!) 157.9 kg (!) 155.9 kg (!) 153.4 kg    Examination:  General exam: Appears  calm and comfortable, morbidly obese Respiratory system: Clear to auscultation. Respiratory effort normal. Cardiovascular system: S1 & S2 heard, RRR. No JVD, murmurs, rubs, gallops or clicks.  +2 pitting edema bilateral lower extremity Gastrointestinal system: Abdomen is nondistended, soft and nontender. No organomegaly or masses felt. Normal bowel sounds heard. Central nervous system: Alert and oriented. No focal neurological deficits. Extremities: Symmetric 5 x 5 power. Skin: Dressing in bilateral lower extremity. Psychiatry: Judgement and insight appear normal. Mood & affect appropriate.    Procedures:  None   Microbiology summarized: None  Sch Meds:  Scheduled Meds: . amiodarone  200 mg Oral Daily  . apixaban  5 mg Oral BID  . atorvastatin  10 mg Oral Daily  . carvedilol  25 mg Oral BID WC  . ferrous sulfate  325 mg Oral BID  . influenza vac split quadrivalent PF  0.5 mL Intramuscular Tomorrow-1000  . insulin aspart  0-20 Units Subcutaneous TID WC  . isosorbide-hydrALAZINE  1.5 tablet Oral TID  . potassium chloride  10 mEq Oral Daily  . sodium chloride flush  3 mL Intravenous Q12H   Continuous Infusions: . sodium chloride    . furosemide (LASIX) infusion 12 mg/hr (05/27/19 1755)   PRN Meds:.sodium chloride, acetaminophen, gabapentin, ondansetron (ZOFRAN) IV, oxyCODONE-acetaminophen, silver sulfADIAZINE, sodium chloride flush  Antimicrobials: Anti-infectives (From admission, onward)   None       I have personally reviewed the following labs and images: CBC: Recent Labs  Lab 05/24/19 0246 05/25/19 0235 05/26/19 0310 05/27/19 0242 05/28/19 0256  WBC 6.0 5.8 5.8 5.3 4.8  HGB 11.0* 11.3* 11.2* 10.5* 10.7*  HCT 34.0* 34.4* 35.0* 32.6* 32.9*  MCV 93.4 92.2 94.9 93.1 92.2  PLT 170 182 182 176 183   BMP &GFR Recent Labs  Lab 05/24/19 0246 05/25/19 0235 05/26/19 0310 05/27/19 0242 05/28/19 0256  NA 143 143 142 141 140  K 4.1 4.1 4.1 4.0 4.0  CL 107 108  105 105 104  CO2 22 23 22 24 25   GLUCOSE 109* 97 85 97 105*  BUN 91* 92* 91* 91* 92*  CREATININE 4.56* 4.48* 4.36* 4.37* 4.36*  CALCIUM 8.6* 8.6* 8.6* 8.6* 8.5*  MG  --   --  2.3 2.3 2.1  PHOS 4.4 4.4 4.6 4.8* 4.7*   Estimated Creatinine Clearance: 33.2 mL/min (A) (by C-G formula based on SCr of 4.36 mg/dL (H)). Liver & Pancreas: Recent Labs  Lab 05/21/19 1651  05/24/19 0246 05/25/19 0235 05/26/19 0310 05/27/19 0242 05/28/19 0256  AST 20  --   --   --   --   --   --   ALT 16  --   --   --   --   --   --   ALKPHOS 79  --   --   --   --   --   --   BILITOT 2.0*  --   --   --   --   --   --   PROT 7.3  --   --   --   --   --   --  ALBUMIN 2.8*   < > 2.5* 2.6* 2.5* 2.5* 2.4*   < > = values in this interval not displayed.   No results for input(s): LIPASE, AMYLASE in the last 168 hours. No results for input(s): AMMONIA in the last 168 hours. Diabetic: No results for input(s): HGBA1C in the last 72 hours. Recent Labs  Lab 05/27/19 0620 05/27/19 1208 05/27/19 1632 05/27/19 2118 05/28/19 0611  GLUCAP 86 129* 118* 119* 107*   Cardiac Enzymes: No results for input(s): CKTOTAL, CKMB, CKMBINDEX, TROPONINI in the last 168 hours. No results for input(s): PROBNP in the last 8760 hours. Coagulation Profile: No results for input(s): INR, PROTIME in the last 168 hours. Thyroid Function Tests: No results for input(s): TSH, T4TOTAL, FREET4, T3FREE, THYROIDAB in the last 72 hours. Lipid Profile: No results for input(s): CHOL, HDL, LDLCALC, TRIG, CHOLHDL, LDLDIRECT in the last 72 hours. Anemia Panel: No results for input(s): VITAMINB12, FOLATE, FERRITIN, TIBC, IRON, RETICCTPCT in the last 72 hours. Urine analysis:    Component Value Date/Time   COLORURINE YELLOW 05/21/2019 1724   APPEARANCEUR CLEAR 05/21/2019 1724   LABSPEC 1.012 05/21/2019 1724   PHURINE 5.0 05/21/2019 1724   GLUCOSEU NEGATIVE 05/21/2019 1724   HGBUR MODERATE (A) 05/21/2019 1724   BILIRUBINUR NEGATIVE  05/21/2019 1724   KETONESUR NEGATIVE 05/21/2019 1724   PROTEINUR 100 (A) 05/21/2019 1724   UROBILINOGEN 0.2 09/02/2009 0935   NITRITE NEGATIVE 05/21/2019 1724   LEUKOCYTESUR NEGATIVE 05/21/2019 1724   Sepsis Labs: Invalid input(s): PROCALCITONIN, [place]  Microbiology: No results found for this or any previous visit (from the past 240 hour(s)).  Radiology Studies: No results found.  Total time spent 26 minutes Darliss Cheney, MD [facility]  If 7PM-7AM, please contact night-coverage [email or web address] Password [facility] 05/28/2019, 8:28 AM

## 2019-05-28 NOTE — Progress Notes (Addendum)
Advanced Heart Failure Rounding Note  PCP-Cardiologist: Dr. Aundra Dubin   Subjective:    Remains on lasix drip. Received dose of metolazone yesterday, 5 mg. Weight down another 5 pounds from yesterday. Overall, down 24 lb since admit.   Creatinine stable over the last 48 hr at 4.36 (4.95 on admit). K 4.0  Resting comfortably in bed. No cardiac complaints. Denies dyspnea.   Objective:   Weight Range: (!) 153.4 kg Body mass index is 47.17 kg/m.   Vital Signs:   Temp:  [97.7 F (36.5 C)-98.6 F (37 C)] 97.7 F (36.5 C) (12/08 0551) Pulse Rate:  [70-75] 70 (12/07 1955) Resp:  [16-18] 18 (12/08 0551) BP: (114-138)/(79-95) 129/90 (12/08 0551) SpO2:  [91 %] 91 % (12/07 1955) Weight:  [153.4 kg] 153.4 kg (12/08 0551) Last BM Date: 05/27/19  Weight change: Filed Weights   05/26/19 0455 05/27/19 0457 05/28/19 0551  Weight: (!) 157.9 kg (!) 155.9 kg (!) 153.4 kg    Intake/Output:   Intake/Output Summary (Last 24 hours) at 05/28/2019 0725 Last data filed at 05/28/2019 [id number] Gross per 24 hour  Intake 503.73 ml  Output 3450 ml  Net -2946.27 ml      Physical Exam    PHYSICAL EXAM: General:  Obese AAM. Chronically ill appearing. No respiratory difficulty HEENT: normal Neck: supple. no JVD. Carotids 2+ bilat; no bruits. No lymphadenopathy or thyromegaly appreciated. Cor: PMI nondisplaced. Regular rate & rhythm. No rubs, gallops or murmurs. Lungs: clear Abdomen: obese, soft, nontender, nondistended. No hepatosplenomegaly. No bruits or masses. Good bowel sounds. Extremities: no cyanosis, clubbing, rash, 2+ bilateral LE edema, bilateral unna boots Neuro: alert & oriented x 3, cranial nerves grossly intact. moves all 4 extremities w/o difficulty. Affect pleasant.   Telemetry   NSR HR 70s  personally reviewed.   EKG    N/a   Labs    CBC Recent Labs    05/27/19 0242 05/28/19 0256  WBC 5.3 4.8  HGB 10.5* 10.7*  HCT 32.6* 32.9*  MCV 93.1 92.2  PLT 176 [id number]   Basic  Metabolic Panel Recent Labs    05/27/19 0242 05/28/19 0256  NA 141 140  K 4.0 4.0  CL 105 104  CO2 24 25  GLUCOSE 97 105*  BUN 91* 92*  CREATININE 4.37* 4.36*  CALCIUM 8.6* 8.5*  MG 2.3 2.1  PHOS 4.8* 4.7*   Liver Function Tests Recent Labs    05/27/19 0242 05/28/19 0256  ALBUMIN 2.5* 2.4*   No results for input(s): LIPASE, AMYLASE in the last 72 hours. Cardiac Enzymes No results for input(s): CKTOTAL, CKMB, CKMBINDEX, TROPONINI in the last 72 hours.  BNP: BNP (last 3 results) Recent Labs    05/25/19 0235 05/26/19 0310 05/27/19 0242  BNP 1,682.3* 1,444.0* 1,403.3*    ProBNP (last 3 results) No results for input(s): PROBNP in the last 8760 hours.   D-Dimer No results for input(s): DDIMER in the last 72 hours. Hemoglobin A1C No results for input(s): HGBA1C in the last 72 hours. Fasting Lipid Panel No results for input(s): CHOL, HDL, LDLCALC, TRIG, CHOLHDL, LDLDIRECT in the last 72 hours. Thyroid Function Tests No results for input(s): TSH, T4TOTAL, T3FREE, THYROIDAB in the last 72 hours.  Invalid input(s): FREET3  Other results:   Imaging    No results found.   Medications:     Scheduled Medications: . amiodarone  200 mg Oral Daily  . apixaban  5 mg Oral BID  . atorvastatin  10 mg Oral Daily  .  carvedilol  25 mg Oral BID WC  . ferrous sulfate  325 mg Oral BID  . influenza vac split quadrivalent PF  0.5 mL Intramuscular Tomorrow-1000  . insulin aspart  0-20 Units Subcutaneous TID WC  . isosorbide-hydrALAZINE  1.5 tablet Oral TID  . potassium chloride  10 mEq Oral Daily  . sodium chloride flush  3 mL Intravenous Q12H    Infusions: . sodium chloride    . furosemide (LASIX) infusion 12 mg/hr (05/27/19 1755)    PRN Medications: sodium chloride, acetaminophen, gabapentin, ondansetron (ZOFRAN) IV, oxyCODONE-acetaminophen, silver sulfADIAZINE, sodium chloride flush     Assessment/Plan  1. Acute on chronic systolic CHF: Echo in 0000000 showed  EF 20-25% with mildly dilated LV, normal RV.  Suspected nonischemic cardiomyopathy, has had low EF known since 2013, at that time had cath with nonobstructive CAD.  He ran out of his meds at home (torsemide in particular) and had significant dietary indiscretion around Thanksgiving.  Creatinine elevated at 4.95 from baseline in the 3's at admission, now down to 4.36 - Volume status improving. Additional 3.4L out yesterday. Down 5 lb from yesterday. Wt down 24 lb total since admit. Received dose of metolazone yesterday w/ lasix. K stable.  Remains volume overloaded. Continue  Lasix gtt at 12 mg/hr. No PICC due to possible need for HD down the road.  - Continue Coreg.  - Continue Bidil 1.5 tab tid. - No ACEI/ARNI/ARB/spironolactone with CKD stage IV.  2. Atrial flutter: Paroxysmal.  Maintaining NSR. HR 70s.  - Continue 200 mg amiodarone daily.  - Continue apixaban.  3. AKI on CKD stage IV: Baseline creatinine 3s. Suspect cardiorenal effect with marked volume overload.  Hopefully diuresis with lowering of renal venous pressure will help. Nephrology following.   Creatinine unchanged down to 4.36.   4. Type II diabetes: Poor control. management per primary team.    Length of Stay: [address] PA-C  05/28/2019, 7:25 AM  Advanced Heart Failure Team Pager [phone] (M-F; 7a - 4p)  Please contact [facility] for night-coverage after hours (4p -7a ) and weekends on [email or web address]  Patient seen with PA, agree with the above note.   Weight continues to fall, good diuresis again with stable renal function.   On exam, still with JVP 14 cm and 2+ edema to knees.   Continue diuresis, keep Lasix gtt 12 mg/hr and will give metolazone 5 mg x 1 again today.  I will also increase his Bidil to goal dose 2 tabs tid (BP stable to mildly elevated).   Loralie Champagne 05/28/2019 11:59 AM

## 2019-05-28 NOTE — Plan of Care (Signed)
  Problem: Clinical Measurements: Goal: Diagnostic test results will improve Outcome: Progressing Goal: Respiratory complications will improve Outcome: Progressing   

## 2019-05-29 LAB — GLUCOSE, CAPILLARY
Glucose-Capillary: 98 mg/dL (ref 70–99)
Glucose-Capillary: 119 mg/dL — ABNORMAL HIGH (ref 70–99)
Glucose-Capillary: 137 mg/dL — ABNORMAL HIGH (ref 70–99)
Glucose-Capillary: 109 mg/dL — ABNORMAL HIGH (ref 70–99)

## 2019-05-29 LAB — RENAL FUNCTION PANEL
Albumin: 2.7 g/dL — ABNORMAL LOW (ref 3.5–5.0)
Anion gap: 12 (ref 5–15)
BUN: 97 mg/dL — ABNORMAL HIGH (ref 6–20)
CO2: 24 mmol/L (ref 22–32)
Calcium: 8.7 mg/dL — ABNORMAL LOW (ref 8.9–10.3)
Chloride: 104 mmol/L (ref 98–111)
Creatinine, Ser: 4.37 mg/dL — ABNORMAL HIGH (ref 0.61–1.24)
GFR calc Af Amer: 18 mL/min — ABNORMAL LOW (ref >60)
GFR calc non Af Amer: 16 mL/min — ABNORMAL LOW (ref >60)
Glucose, Bld: 105 mg/dL — ABNORMAL HIGH (ref 70–99)
Phosphorus: 4.4 mg/dL (ref 2.5–4.6)
Potassium: 4 mmol/L (ref 3.5–5.1)
Sodium: 140 mmol/L (ref 135–145)

## 2019-05-29 LAB — MAGNESIUM: Magnesium: 2.1 mg/dL (ref 1.7–2.4)

## 2019-05-29 IMAGING — US US RENAL
1 series · 14 of 25 positions shown · non-contrast
Comparison: 08/17/2016

CLINICAL DATA: Acute kidney injury.  Chronic kidney disease.

EXAM:
RENAL / URINARY TRACT ULTRASOUND COMPLETE

[Series 1: us renal · 0.30mm/px · 14 of 42 slices shown]
[im 1/42]
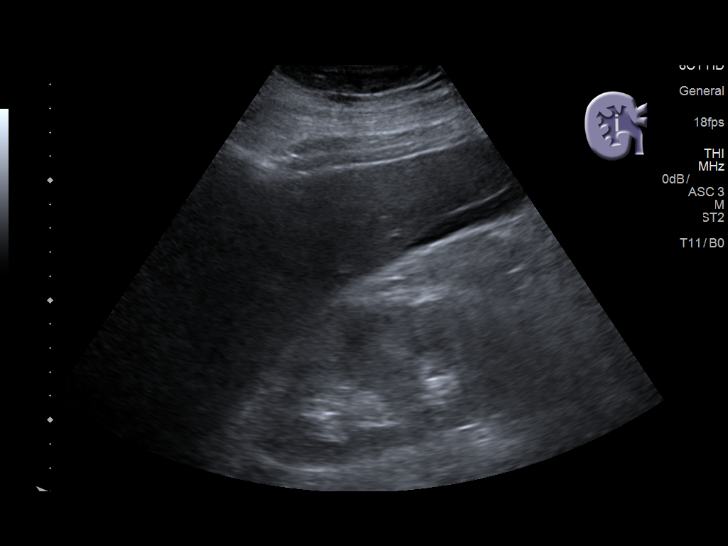
[im 4/42]
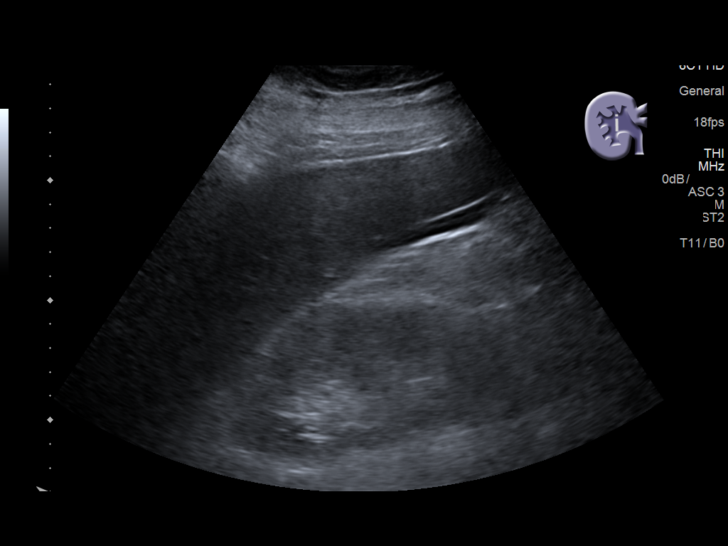
[im 7/42]
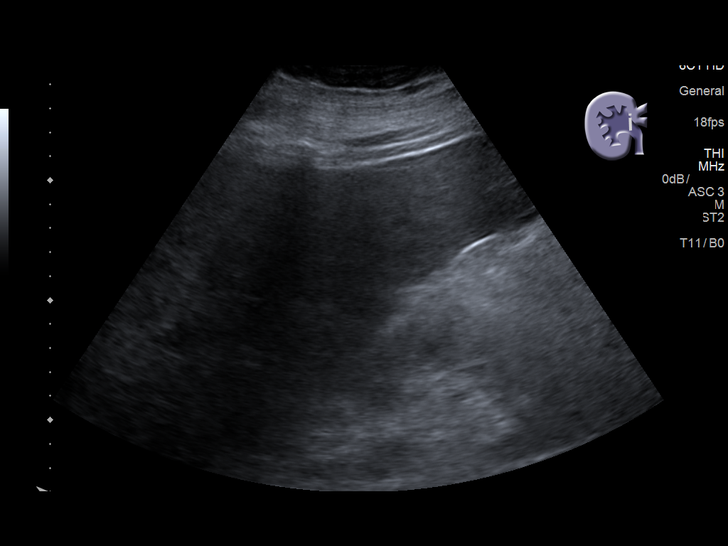
[im 11/42]
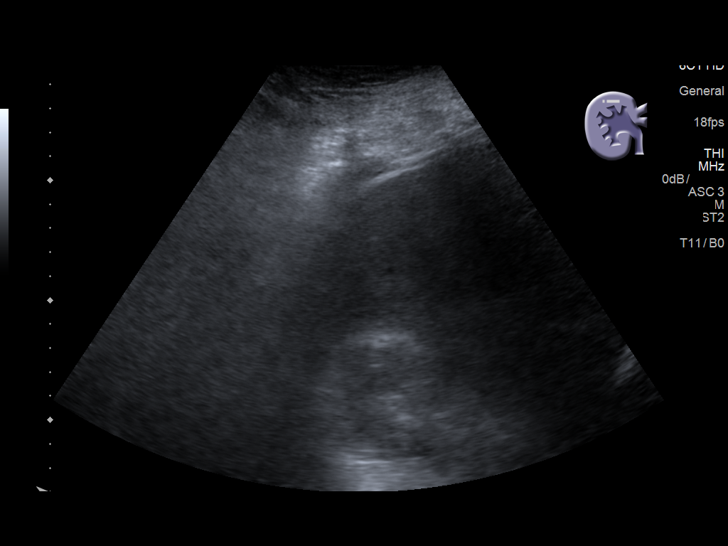
[im 14/42]
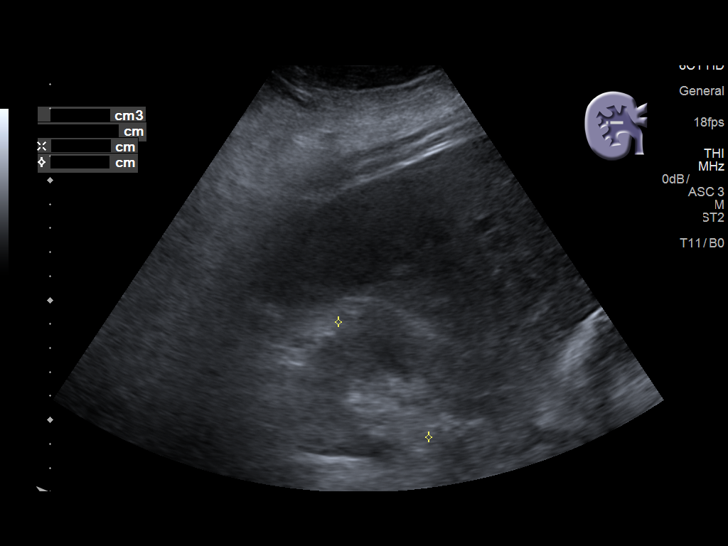
[im 16/42]
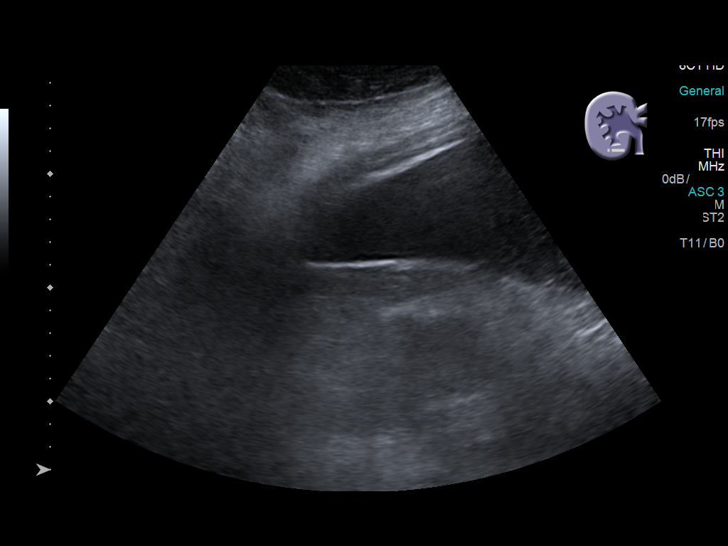
[im 19/42]
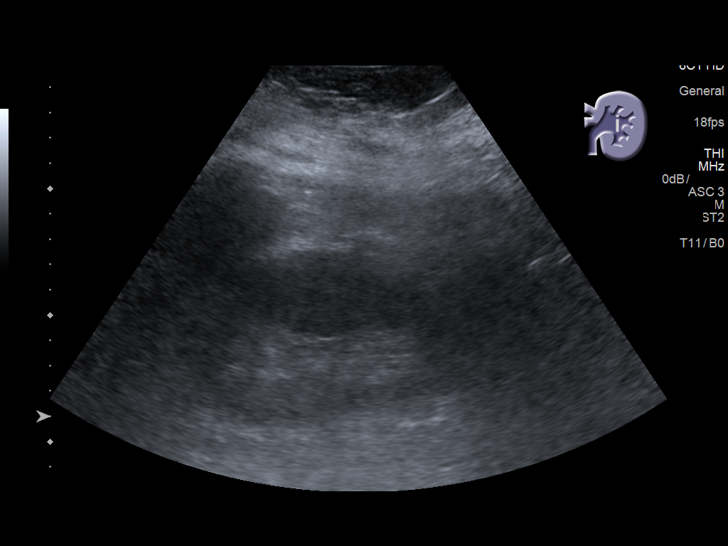
[im 23/42]
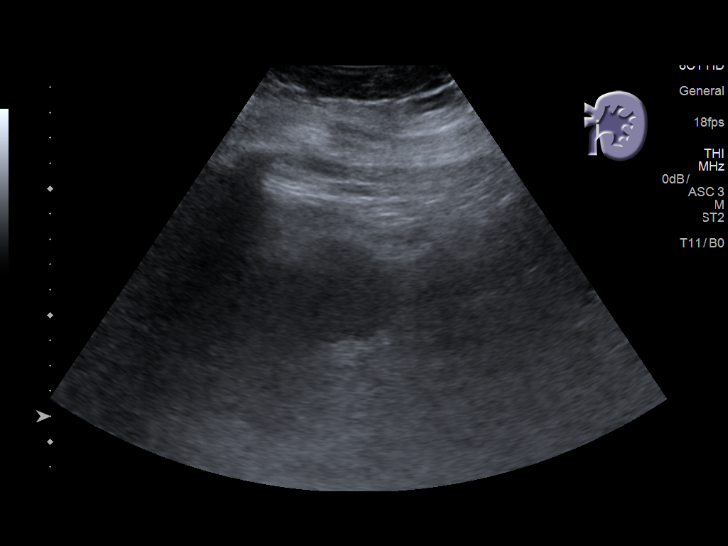
[im 26/42]
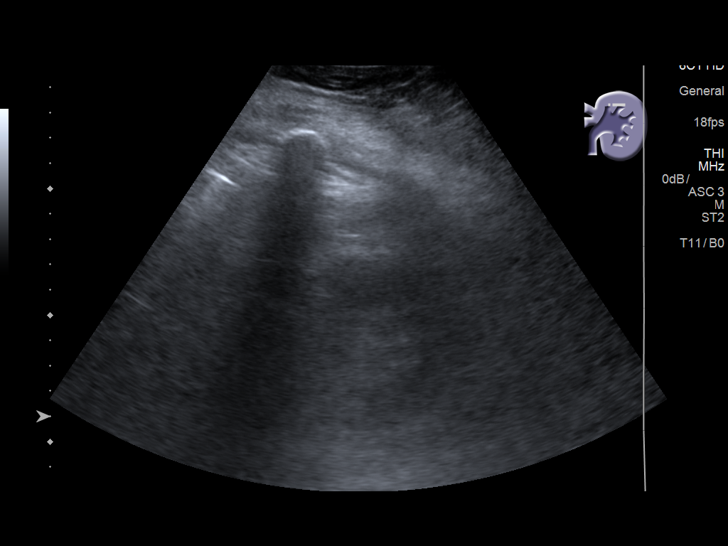
[im 28/42]
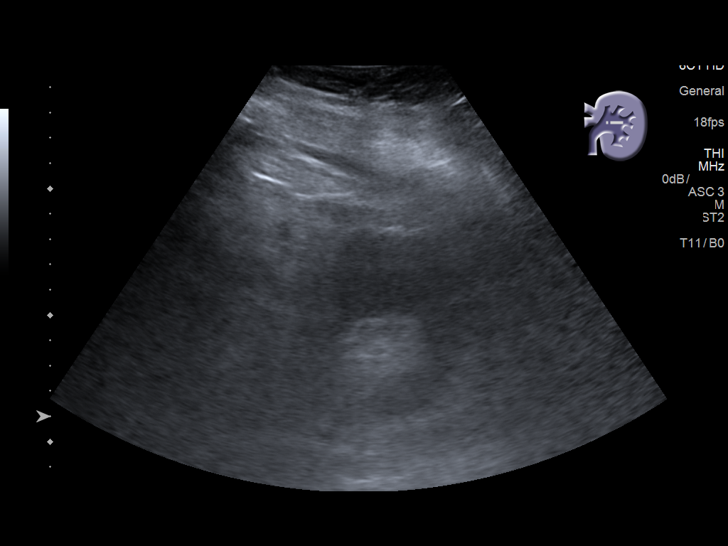
[im 31/42]
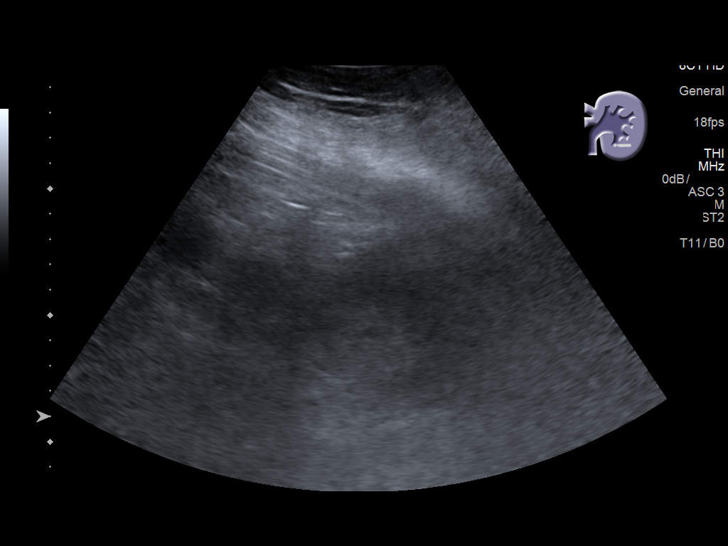
[im 35/42]
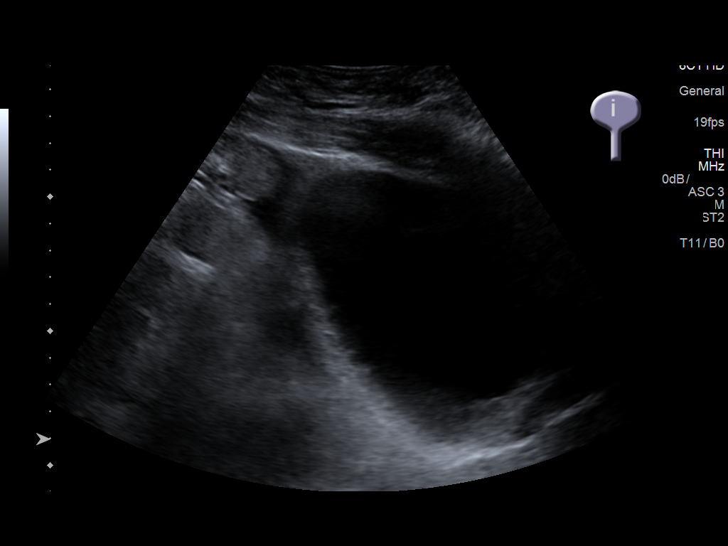
[im 38/42]
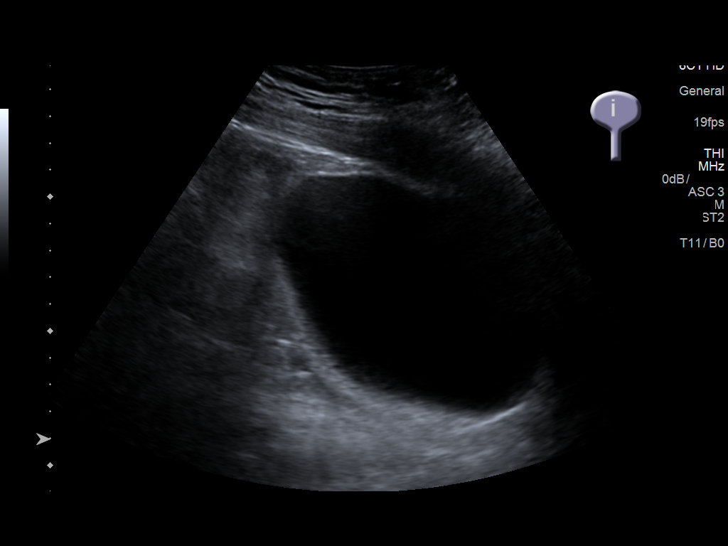
[im 42/42]
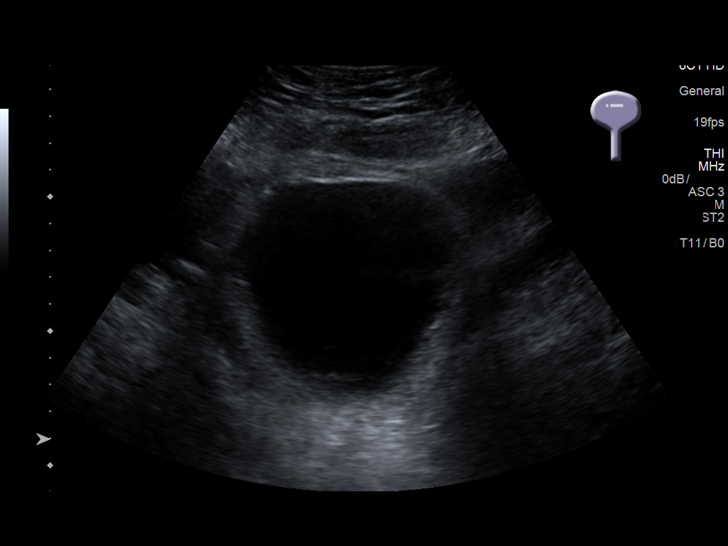

[14 of 25 positions shown; findings below may reference images not displayed]

FINDINGS: Right Kidney:

Renal measurements: 11.9 by 6.2 by 6.1 cm = volume: 236 mL. Abnormal
increased echogenicity. No mass or hydronephrosis visualized.

Left Kidney:

Renal measurements: 11.1 by 6.6 by 5.5 cm = volume: 211 mL. Abnormal
increased echogenicity. No mass or hydronephrosis visualized.

Bladder:

Appears normal for degree of bladder distention.

Other:

Trace ascites along the hepatic margin.
IMPRESSION: 1. Bilateral echogenic kidneys suggesting chronic medical renal
disease.
2. No hydronephrosis or hydroureter.
3. Trace ascites along the hepatic margin

## 2019-05-29 MED ORDER — METOLAZONE 5 MG PO TABS
5.0000 mg | ORAL_TABLET | Freq: Once | ORAL | Status: AC
  Administered 2019-05-29: 5 mg via ORAL
  Filled 2019-05-29: qty 1

## 2019-05-29 NOTE — Progress Notes (Signed)
CARDIAC REHAB PHASE I   PRE:  Rate/Rhythm: 70 SR    BP: sitting 111/81    SaO2: 91 RA  MODE:  Ambulation: 400 ft   POST:  Rate/Rhythm: 80 SR    BP: sitting 123/82     SaO2: 90-91 RA  Pt able to increase distance today with bariatric RW, supervision. Needs to rest every 80-100 ft due to SOB. Harder for him to breathe in mask. He thinks he will try a different mask next walk. Reviewed HF booklet and low sodium diet. He voices that he can "tighten up" with his diet. He admits to eating out and snacking. Asked RD to discuss renal diet with him. He sts he needs a new scale. [id number]  [place], ACSM 05/29/2019 11:37 AM

## 2019-05-29 NOTE — Progress Notes (Addendum)
Advanced Heart Failure Rounding Note  PCP-Cardiologist: Dr. Aundra Dubin   Subjective:    Yesterday diuresed with lasix drip + metolazone. Weight down another 6 pounds.  Creatinine unchanged.   Denies SOB/ Cp     Objective:   Weight Range: (!) 151.4 kg Body mass index is 46.54 kg/m.   Vital Signs:   Temp:  [97.7 F (36.5 C)-98.4 F (36.9 C)] 97.7 F (36.5 C) (12/09 0825) Pulse Rate:  [68-78] 75 (12/09 0825) Resp:  [14-20] 15 (12/09 0825) BP: (123-130)/(80-97) 126/80 (12/09 0825) SpO2:  [94 %-98 %] 98 % (12/09 0825) Weight:  [151.4 kg] 151.4 kg (12/09 0417) Last BM Date: 05/28/19  Weight change: Filed Weights   05/27/19 0457 05/28/19 0551 05/29/19 0417  Weight: (!) 155.9 kg (!) 153.4 kg (!) 151.4 kg    Intake/Output:   Intake/Output Summary (Last 24 hours) at 05/29/2019 1010 Last data filed at 05/29/2019 0830 Gross per 24 hour  Intake 1039.14 ml  Output 3275 ml  Net -2235.86 ml      Physical Exam    PHYSICAL EXAM: General:   No resp difficulty HEENT: normal Neck: supple. JVP to jaw 1. Carotids 2+ bilat; no bruits. No lymphadenopathy or thryomegaly appreciated. Cor: PMI nondisplaced. Regular rate & rhythm. No rubs, gallops or murmurs. Lungs: clear Abdomen: soft, nontender, nondistended. No hepatosplenomegaly. No bruits or masses. Good bowel sounds. Extremities: no cyanosis, clubbing, rash, R and LLE 2+  edema Neuro: alert & orientedx3, cranial nerves grossly intact. moves all 4 extremities w/o difficulty. Affect pleasant   Telemetry   NSR 70s personally reviewed.   EKG    N/a   Labs    CBC Recent Labs    05/27/19 0242 05/28/19 0256  WBC 5.3 4.8  HGB 10.5* 10.7*  HCT 32.6* 32.9*  MCV 93.1 92.2  PLT 176 [id number]   Basic Metabolic Panel Recent Labs    05/28/19 0256 05/29/19 0151  NA 140 140  K 4.0 4.0  CL 104 104  CO2 25 24  GLUCOSE 105* 105*  BUN 92* 97*  CREATININE 4.36* 4.37*  CALCIUM 8.5* 8.7*  MG 2.1 2.1  PHOS 4.7* 4.4   Liver  Function Tests Recent Labs    05/28/19 0256 05/29/19 0151  ALBUMIN 2.4* 2.7*   No results for input(s): LIPASE, AMYLASE in the last 72 hours. Cardiac Enzymes No results for input(s): CKTOTAL, CKMB, CKMBINDEX, TROPONINI in the last 72 hours.  BNP: BNP (last 3 results) Recent Labs    05/25/19 0235 05/26/19 0310 05/27/19 0242  BNP 1,682.3* 1,444.0* 1,403.3*    ProBNP (last 3 results) No results for input(s): PROBNP in the last 8760 hours.   D-Dimer No results for input(s): DDIMER in the last 72 hours. Hemoglobin A1C No results for input(s): HGBA1C in the last 72 hours. Fasting Lipid Panel No results for input(s): CHOL, HDL, LDLCALC, TRIG, CHOLHDL, LDLDIRECT in the last 72 hours. Thyroid Function Tests No results for input(s): TSH, T4TOTAL, T3FREE, THYROIDAB in the last 72 hours.  Invalid input(s): FREET3  Other results:   Imaging    No results found.   Medications:     Scheduled Medications: . amiodarone  200 mg Oral Daily  . apixaban  5 mg Oral BID  . atorvastatin  10 mg Oral Daily  . carvedilol  25 mg Oral BID WC  . ferrous sulfate  325 mg Oral BID  . influenza vac split quadrivalent PF  0.5 mL Intramuscular Tomorrow-1000  . insulin aspart  0-20  Units Subcutaneous TID WC  . isosorbide-hydrALAZINE  2 tablet Oral TID  . potassium chloride  10 mEq Oral Daily  . sodium chloride flush  3 mL Intravenous Q12H    Infusions: . sodium chloride    . furosemide (LASIX) infusion 12 mg/hr (05/29/19 0500)    PRN Medications: sodium chloride, acetaminophen, gabapentin, ondansetron (ZOFRAN) IV, oxyCODONE-acetaminophen, silver sulfADIAZINE, sodium chloride flush     Assessment/Plan  1. Acute on chronic systolic CHF: Echo in 0000000 showed EF 20-25% with mildly dilated LV, normal RV.  Suspected nonischemic cardiomyopathy, has had low EF known since 2013, at that time had cath with nonobstructive CAD.  He ran out of his meds at home (torsemide in particular) and had  significant dietary indiscretion around Thanksgiving.  Creatinine elevated at 4.95 from baseline in the 3's at admission, now down to 4.4 - Weight continues to trend down from 362-->333 pounds.   Continue  Lasix gtt at 12 mg/hr and give 5 mg metolazone today.  . No PICC due to possible need for HD down the road.  - Continue Coreg.  - Continue Bidil 2 tab tid.  - No ACEI/ARNI/ARB/spironolactone with CKD stage IV.  2. Atrial flutter: Paroxysmal.  - Maintaining NSR.  - Continue 200 mg amiodarone daily.  - Continue apixaban.  3. AKI on CKD stage IV: Baseline creatinine 3s. Suspect cardiorenal effect with marked volume overload.   Nephrology following. Discussed fistula placement but he is not sure he wants to pursue.  Creatinine unchanged 4.37.   4. Type II diabetes: Poor control. management per primary team.    Length of Stay: [place], NP  05/29/2019, 10:10 AM  Advanced Heart Failure Team Pager [phone] (M-F; [place])  Please contact [facility] for night-coverage after hours (4p -7a ) and weekends on [email or web address]  Patient seen with NP, agree with the above note.   He remains volume overloaded, stable creatinine with diuresis.  Continue Lasix 12 mg/hr and will give metolazone 5 mg x 1 again.    Loralie Champagne 05/29/2019

## 2019-05-29 NOTE — Progress Notes (Signed)
Nutrition Education Note  Patient requesting diet recommendation for CKD IV.   Reviewed food groups and provided written recommended serving sizes specifically determined for patient's current nutritional status.   Explained why diet restrictions are needed and provided lists of foods to limit/avoid that are high potassium and sodium. Provided specific recommendations on safer alternatives of these foods. Strongly encouraged compliance of this diet. Discussed need for fluid restriction and importance of daily weights.   Pt reports eating out frequently with his kids. They typically go to fast food restaurants. Discussed alternatives he could eat at home. Discouraged the use of salt shaker and processed/canned foods. Pt still thinking about HD access placement for future need as he is unsure if he would ever want HD.    Handouts provided. Teach back method used.  Expect fair compliance.  Body mass index is 46.54 kg/m. Pt meets criteria for morbid obesity based on current BMI.  Current diet order is heart healthy/carb modified, patient is consuming approximately 70-100% of meals at this time. Labs and medications reviewed. No further nutrition interventions warranted at this time. RD contact information provided. If additional nutrition issues arise, please re-consult RD.  Mariana Single RD, LDN Clinical Nutrition Pager # [phone]

## 2019-05-29 NOTE — Progress Notes (Signed)
Patient ID: DEKLYN APPLEWHITE, male   DOB: December 24, 1975, 43 y.o.   MRN: [id number] [facility] Progress Note   Assessment/ Plan:   1. Acute kidney Injury on chronic kidney disease stage IV:  Likely secondary to chronic cardiorenal syndrome exacerbated by recent decompensation.  Continues to maintain fixed urine output of about 3.5-4.0 L/day on the current dose of diuretics; corresponding weight loss seen.  Today we had discussion regarding the utility of getting a hemodialysis access placed during this hospitalization in anticipation for dialysis in the near future-he will think about it overnight.  (This has been a problem in the past). 2.  Acute exacerbation of chronic diastolic/systolic congestive heart failure: Secondary to nonadherence with medications/diet-remains volume overloaded and responding well to diuretics.  Continues to lose weight and now 33 pounds over his dry weight (down from 362). 3.  Atrial flutter: Adequately rate controlled on carvedilol/amiodarone, on anticoagulation with Eliquis. 4.  Hypertension: Blood pressure under acceptable control  Subjective:   Denies any acute events overnight, reports that he is doing better with ambulation.   Objective:   BP 126/80   Pulse 75   Temp 97.7 F (36.5 C) (Oral)   Resp 15   Ht 5\' 11"  (1.803 m)   Wt (!) 151.4 kg   SpO2 98%   BMI 46.54 kg/m   Intake/Output Summary (Last 24 hours) at 05/29/2019 0846 Last data filed at 05/29/2019 0830 Gross per 24 hour  Intake 1039.14 ml  Output 3825 ml  Net -2785.86 ml   Weight change: -2.035 kg  Physical Exam: Gen: Resting in bed, getting ready to work with PT CVS: Pulse regular rhythm, normal rate, S1 and S2 normal Resp: Distant breath sounds bilaterally, no distinct rales or rhonchi Abd: Soft, obese, nontender Ext: Bilateral Ace wraps, 1+ lower extremity edema  Imaging: No results found.  Labs: BMET Recent Labs  Lab 05/23/19 0339 05/24/19 0246 05/25/19 0235  05/26/19 0310 05/27/19 0242 05/28/19 0256 05/29/19 0151  NA 142 143 143 142 141 140 140  K 4.2 4.1 4.1 4.1 4.0 4.0 4.0  CL 110 107 108 105 105 104 104  CO2 21* 22 23 22 24 25 24   GLUCOSE 111* 109* 97 85 97 105* 105*  BUN 92* 91* 92* 91* 91* 92* 97*  CREATININE 4.70* 4.56* 4.48* 4.36* 4.37* 4.36* 4.37*  CALCIUM 8.5* 8.6* 8.6* 8.6* 8.6* 8.5* 8.7*  PHOS 4.9* 4.4 4.4 4.6 4.8* 4.7* 4.4   CBC Recent Labs  Lab 05/25/19 0235 05/26/19 0310 05/27/19 0242 05/28/19 0256  WBC 5.8 5.8 5.3 4.8  HGB 11.3* 11.2* 10.5* 10.7*  HCT 34.4* 35.0* 32.6* 32.9*  MCV 92.2 94.9 93.1 92.2  PLT 182 182 176 183    Medications:    . amiodarone  200 mg Oral Daily  . apixaban  5 mg Oral BID  . atorvastatin  10 mg Oral Daily  . carvedilol  25 mg Oral BID WC  . ferrous sulfate  325 mg Oral BID  . influenza vac split quadrivalent PF  0.5 mL Intramuscular Tomorrow-1000  . insulin aspart  0-20 Units Subcutaneous TID WC  . isosorbide-hydrALAZINE  2 tablet Oral TID  . potassium chloride  10 mEq Oral Daily  . sodium chloride flush  3 mL Intravenous Q12H   Elmarie Shiley, MD 05/29/2019, 8:46 AM

## 2019-05-29 NOTE — TOC Initial Note (Signed)
Transition of Care (TOC) - Initial/Assessment Note  Marvetta Gibbons RN, BSN Transitions of Care Unit 4E- RN Case Manager [phone]   Patient Details  Name: MCCADE STUDLEY MRN: [id number] Date of Birth: [birth year]  Transition of Care [facility]) CM/SW Contact:    Dawayne Patricia, RN Phone Number: 05/29/2019, 4:22 PM  Clinical Narrative:                 Pt admitted with acute on chronic HF, - lives at home with girlfriend and children (8/9). Orders placed for HHRN/PT- CM spoke with pt at bedside- regarding transition of care needs- list provided to pt for [facility] choice Per CMS guidelines from medicare.gov website with star ratings (copy placed in shadow chart)- per pt he has used [facility] in past and wants to use them again for services. Pt also needs RW and 3n1 for home- orders to be placed per MD.  Confirmed PCP as Betty [name] with [facility]. Call made to Butch Penny with [facility]  For [facility] referral- referral has been accepted with [facility] date as 12/13. CM will continue to follow and arrange DME prior to discharge.   Expected Discharge Plan: [place] Barriers to Discharge: Continued Medical Work up   Patient Goals and CMS Choice Patient states their goals for this hospitalization and ongoing recovery are:: return home and try to stay out of hospital CMS Medicare.gov Compare Post Acute Care list provided to:: Patient Choice offered to / list presented to : Patient  Expected Discharge Plan and Services Expected Discharge Plan: [place]   Discharge Planning Services: CM Consult Post Acute Care Choice: Home Health, Durable Medical Equipment Living arrangements for the past 2 months: Single Family Home                 DME Arranged: 3-N-1, [place] rolling DME Agency: [facility]       HH Arranged: RN, PT [place] Agency: [facility] ([place]) Date HH Agency Contacted: 05/29/19 Time [place]: 1400 Representative spoke with at [place] Arrangements/Services Living arrangements for the past 2 months: [place] with:: Significant Other Patient language and need for interpreter reviewed:: Yes Do you feel safe going back to the place where you live?: Yes      Need for Family Participation in Patient Care: Yes (Comment) Care giver support system in place?: Yes (comment)   Criminal Activity/Legal Involvement Pertinent to Current Situation/Hospitalization: No - Comment as needed  Activities of Daily Living Home Assistive Devices/Equipment: None ADL Screening (condition at time of admission) Patient's cognitive ability adequate to safely complete daily activities?: Yes Is the patient deaf or have difficulty hearing?: No Does the patient have difficulty seeing, even when wearing glasses/contacts?: No Does the patient have difficulty concentrating, remembering, or making decisions?: No Patient able to express need for assistance with ADLs?: Yes Does the patient have difficulty dressing or bathing?: No Independently performs ADLs?: Yes (appropriate for developmental age) Does the patient have difficulty walking or climbing stairs?: Yes Weakness of Legs: Both Weakness of Arms/Hands: None  Permission Sought/Granted Permission sought to share information with : Facility Art therapist granted to share information with : Yes, Verbal Permission Granted     Permission granted to share info w AGENCY: [facility]        Emotional Assessment Appearance:: Appears stated age Attitude/Demeanor/Rapport: Engaged Affect (typically observed): Appropriate, Pleasant Orientation: : Oriented to Self, Oriented to Place, Oriented to  Time,  Oriented to Situation   Psych Involvement: No (comment)  Admission diagnosis:  CHF (congestive heart failure) (HCC) [I50.9] Acute renal failure superimposed on chronic kidney disease, unspecified CKD stage, unspecified acute renal failure type ([place]) [N17.9, N18.9] Acute  on chronic congestive heart failure, unspecified heart failure type [facility]) [I50.9] Patient Active Problem List   Diagnosis Date Noted  . Physical deconditioning 05/28/2019  . Acute exacerbation of CHF (congestive heart failure) ([place]) 05/23/2019  . Acute on chronic systolic heart failure ([place]) 05/21/2019  . Anemia, iron deficiency 11/06/2018  . Heart failure ([place]) 08/31/2018  . Acute on chronic combined systolic and diastolic CHF (congestive heart failure) ([place]) 08/29/2018  . S/P split thickness skin graft 08/02/2018  . Subacute osteomyelitis, left ankle and foot ([place])   . Non-compliant patient 12/18/2017  . 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 Hypovitaminosis D 11/27/2017  . Diabetic foot ulcer ([place]) 11/27/2017  . Cellulitis in diabetic foot ([place]) 11/27/2017  . Acute heart failure ([place]) 11/27/2017  . Volume overload 11/27/2017  . Hepatopathy 11/27/2017  . Hypokalemia 05/31/2017  . Diabetic peripheral neuropathy ([place]) 12/20/2016  . Hyperlipidemia associated with type 2 diabetes mellitus ([place]) 11/27/2016  . Shingles 10/11/2016  . Status post peripherally inserted central catheter (PICC) central line placement 09/12/2016  . Medication monitoring encounter 09/12/2016  . Arthritis, septic ([place])   . CKD (chronic kidney disease), stage IV ([place])   . AKI (acute kidney injury) ([place])   . Peripheral edema   . Acute renal failure ([place]) 08/17/2016  . Acute systolic CHF (congestive heart failure) ([place]) 08/17/2016  . Type 2 diabetes mellitus with vascular disease ([place]) 10/21/2013  . Chronic systolic heart failure ([place]) 02/27/2012  . Coronary artery disease, 50% LAD at cath 02/16/12 02/23/2012  . Family history of coronary artery disease 02/23/2012  . Cardiogenic shock ([place]) 02/17/2012  . NSVT (nonsustained ventricular tachycardia) ([place]) 02/16/2012  . Obesity 02/16/2012  . Acute on chronic systolic CHF (congestive heart failure)  ([place]) 02/16/2012  . CHF (congestive heart failure) ([place]) 02/15/2012  . Hypertension 02/15/2012  . Non-ischemic cardiomyopathy, EF < 20% 02/15/2012  . TINEA CRURIS 09/25/2009  . ALLERGIC RHINITIS CAUSE UNSPECIFIED 09/25/2009  . SCROTAL ABSCESS 09/17/2009  . Hypertensive heart disease with heart failure ([place]) 01/26/2009   PCP:  [name], [name], MD Pharmacy:   [place] ([place]), [place] - [address] [place] ([place]) [place] [address] Phone: [phone] Fax: [phone]     Social Determinants of Health (SDOH) Interventions    Readmission Risk Interventions No flowsheet data found.

## 2019-05-29 NOTE — Progress Notes (Signed)
PROGRESS NOTE  Joshua Massey [id number] DOB: 03-24-1976   PCP: [name], [name], MD  Patient is from: Home.  Uses cane and walker at baseline.  DOA: 05/21/2019 LOS: 8  Brief Narrative / Interim history: 43 year old male with history of DM-2, HTN, CKD-4, lower extremity diabetic wound followed by Dr. Sharol Given, paroxysmal A. fib on Eliquis, combined CHF presented with shortness of breath and edema in the setting of missed doses of diuretics and dietary indiscretion, and admitted for acute on chronic CHF.  On IV Lasix drip per advanced heart failure as well as intermittent doses of metolazone.  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  LHC with nonobstructive CAD in 2013.  Ran out of his torsemide.  Also dietary indiscretion.  CXR consistent with CHF.  BNP elevated.  Started on Lasix drip-UOP 700 mL/ 24 hours (?  Reliability).  Net> -18.893 L.   Renal function is stable.. -Advanced heart failure managing-continue Lasix drip and metolazone -Continue Coreg and BiDil -No ACEI/ARNI/ARB/Aldactone due to renal function -Appreciate dietitian input about dietary counseling -Counseled on the importance of compliance  Uncontrolled DM-2 with CKD-4: A1c 8.5% in 2019. CBG (last 3)  Recent Labs    05/25/19 1639 05/25/19 2109 05/26/19 0622  GLUCAP 124* 106* 95  -Blood sugar control.  Continue SSI.  AKI on CKD-4/azotemia: Cr 3.6-3.7 (baseline)> 4.95 (admit)>>> 4.48> 4.36> 4.37> 4.36-likely due to cardiorenal syndrome.  Has plateaued now. -Nephrology on board, appreciate their help and management deferred to them.  Anemia: Combination of ACD and IDA: Hgb 10-11 (baseline)> 11.5>> 11.3> 11.2> 10.5.  Anemia panel consistent with iron deficiency.  Stable. -IV ferric gluconate on 05/25/2019. -Continue p.o. ferrous sulfate with bowel regimen  Paroxysmal A. Fib/flutter: NSR -Amiodarone and Eliquis per cardiology  Essential hypertension:  Normotensive -Cardiac meds as above  Physical deconditioning: Reportedly uses cane.  -PT/OT eval  Morbid obesity: BMI 49.41. -Encourage lifestyle change to lose weight -Could benefit from newer diabetic agents with cardiovascular benefits -Appreciate dietitian input.  Lower extremity superficial wounds: POA.  Followed by Dr. Sharol Given. -Appreciate wound care input-"patient has superficial wounds to the right posterior achilles area that measures 2 cm x 2 cm and is 100% pink and clean.  The right LE lateral aspect as two small abrasion that are also 100% pink and clean.  The left dorsal foot has a superficial wound that measures 1.6 cm x 4 cm and is 100% clean and pink.  The patient states these started from compression stockings that rolled and pleated and caused pressure to the areas.   Patient states he has worn unna boots in the past and they worked well for him.  Recruitment consultant boots bilaterally, change weekly per WOC    DVT prophylaxis: On Eliquis for A. fib Code Status: Full code Family Communication: No family present.  Patient alert and oriented.  Discussed plan of care with him and he verbalized understanding. Disposition Plan: Remains inpatient for adequate diuresis.  Discharge when cleared by cardiology. Consultants: Advanced HF and nephrology  Subjective: Patient seen and examined.  Denied any shortness of breath.  No other complaint.  Objective: Vitals:   05/28/19 1714 05/28/19 1943 05/29/19 0417 05/29/19 0420  BP: (!) 125/97 123/85  130/80  Pulse:  68  78  Resp: 17 20  20   Temp: 97.8 F (36.6 C) 97.8 F (36.6 C)  98.4 F (36.9 C)  TempSrc: Oral Oral  Oral  SpO2:  98%  94%  Weight:   (!) 151.4 kg   Height:        Intake/Output Summary (Last 24 hours) at 05/29/2019 0741 Last data filed at 05/29/2019 0500 Gross per 24 hour  Intake 1039.14 ml  Output 3125 ml  Net -2085.86 ml   Filed Weights   05/27/19 0457 05/28/19 0551 05/29/19 0417  Weight: (!) 155.9 kg (!) 153.4 kg (!)  151.4 kg    Examination:  General exam: Appears calm and comfortable, morbidly obese Respiratory system: Clear to auscultation. Respiratory effort normal. Cardiovascular system: S1 & S2 heard, RRR. No JVD, murmurs, rubs, gallops or clicks.  2+ pitting edema bilateral lower extremity. Gastrointestinal system: Abdomen is nondistended, soft and nontender. No organomegaly or masses felt. Normal bowel sounds heard. Central nervous system: Alert and oriented. No focal neurological deficits. Extremities: Symmetric 5 x 5 power. Skin: No rashes, lesions or ulcers.  Psychiatry: Judgement and insight appear normal. Mood & affect appropriate.    Procedures:  None   Microbiology summarized: None  Sch Meds:  Scheduled Meds: . amiodarone  200 mg Oral Daily  . apixaban  5 mg Oral BID  . atorvastatin  10 mg Oral Daily  . carvedilol  25 mg Oral BID WC  . ferrous sulfate  325 mg Oral BID  . influenza vac split quadrivalent PF  0.5 mL Intramuscular Tomorrow-1000  . insulin aspart  0-20 Units Subcutaneous TID WC  . isosorbide-hydrALAZINE  2 tablet Oral TID  . potassium chloride  10 mEq Oral Daily  . sodium chloride flush  3 mL Intravenous Q12H   Continuous Infusions: . sodium chloride    . furosemide (LASIX) infusion 12 mg/hr (05/29/19 0500)   PRN Meds:.sodium chloride, acetaminophen, gabapentin, ondansetron (ZOFRAN) IV, oxyCODONE-acetaminophen, silver sulfADIAZINE, sodium chloride flush  Antimicrobials: Anti-infectives (From admission, onward)   None       I have personally reviewed the following labs and images: CBC: Recent Labs  Lab 05/24/19 0246 05/25/19 0235 05/26/19 0310 05/27/19 0242 05/28/19 0256  WBC 6.0 5.8 5.8 5.3 4.8  HGB 11.0* 11.3* 11.2* 10.5* 10.7*  HCT 34.0* 34.4* 35.0* 32.6* 32.9*  MCV 93.4 92.2 94.9 93.1 92.2  PLT 170 182 182 176 183   BMP &GFR Recent Labs  Lab 05/25/19 0235 05/26/19 0310 05/27/19 0242 05/28/19 0256 05/29/19 0151  NA 143 142 141 140  140  K 4.1 4.1 4.0 4.0 4.0  CL 108 105 105 104 104  CO2 23 22 24 25 24   GLUCOSE 97 85 97 105* 105*  BUN 92* 91* 91* 92* 97*  CREATININE 4.48* 4.36* 4.37* 4.36* 4.37*  CALCIUM 8.6* 8.6* 8.6* 8.5* 8.7*  MG  --  2.3 2.3 2.1 2.1  PHOS 4.4 4.6 4.8* 4.7* 4.4   Estimated Creatinine Clearance: 32.9 mL/min (A) (by C-G formula based on SCr of 4.37 mg/dL (H)). Liver & Pancreas: Recent Labs  Lab 05/25/19 0235 05/26/19 0310 05/27/19 0242 05/28/19 0256 05/29/19 0151  ALBUMIN 2.6* 2.5* 2.5* 2.4* 2.7*   No results for input(s): LIPASE, AMYLASE in the last 168 hours. No results for input(s): AMMONIA in the last 168 hours. Diabetic: No results for input(s): HGBA1C in the last 72 hours. Recent Labs  Lab 05/28/19 0611 05/28/19 1103 05/28/19 1825 05/28/19 2112 05/29/19 0637  GLUCAP 107* 90 111* 134* 98   Cardiac Enzymes: No results for input(s): CKTOTAL, CKMB, CKMBINDEX, TROPONINI in the last 168 hours. No results for input(s): PROBNP in the last 8760 hours. Coagulation Profile: No results  for input(s): INR, PROTIME in the last 168 hours. Thyroid Function Tests: No results for input(s): TSH, T4TOTAL, FREET4, T3FREE, THYROIDAB in the last 72 hours. Lipid Profile: No results for input(s): CHOL, HDL, LDLCALC, TRIG, CHOLHDL, LDLDIRECT in the last 72 hours. Anemia Panel: No results for input(s): VITAMINB12, FOLATE, FERRITIN, TIBC, IRON, RETICCTPCT in the last 72 hours. Urine analysis:    Component Value Date/Time   COLORURINE YELLOW 05/21/2019 1724   APPEARANCEUR CLEAR 05/21/2019 1724   LABSPEC 1.012 05/21/2019 1724   PHURINE 5.0 05/21/2019 1724   GLUCOSEU NEGATIVE 05/21/2019 1724   HGBUR MODERATE (A) 05/21/2019 1724   BILIRUBINUR NEGATIVE 05/21/2019 1724   KETONESUR NEGATIVE 05/21/2019 1724   PROTEINUR 100 (A) 05/21/2019 1724   UROBILINOGEN 0.2 09/02/2009 0935   NITRITE NEGATIVE 05/21/2019 1724   LEUKOCYTESUR NEGATIVE 05/21/2019 1724   Sepsis Labs: Invalid input(s):  PROCALCITONIN, [place]  Microbiology: No results found for this or any previous visit (from the past 240 hour(s)).  Radiology Studies: No results found.  Total time spent 25 minutes Darliss Cheney, MD [facility]  If 7PM-7AM, please contact night-coverage [email or web address] Password [email or web address] 05/29/2019, 7:41 AM

## 2019-05-30 ENCOUNTER — Inpatient Hospital Stay (HOSPITAL_COMMUNITY): Payer: BC Managed Care – PPO

## 2019-05-30 DIAGNOSIS — N186 End stage renal disease: Secondary | ICD-10-CM

## 2019-05-30 LAB — RENAL FUNCTION PANEL
Albumin: 2.8 g/dL — ABNORMAL LOW (ref 3.5–5.0)
Anion gap: 13 (ref 5–15)
BUN: 95 mg/dL — ABNORMAL HIGH (ref 6–20)
CO2: 26 mmol/L (ref 22–32)
Calcium: 8.4 mg/dL — ABNORMAL LOW (ref 8.9–10.3)
Chloride: 102 mmol/L (ref 98–111)
Creatinine, Ser: 4.62 mg/dL — ABNORMAL HIGH (ref 0.61–1.24)
GFR calc Af Amer: 17 mL/min — ABNORMAL LOW (ref >60)
GFR calc non Af Amer: 15 mL/min — ABNORMAL LOW (ref >60)
Glucose, Bld: 97 mg/dL (ref 70–99)
Phosphorus: 4.5 mg/dL (ref 2.5–4.6)
Potassium: 4.2 mmol/L (ref 3.5–5.1)
Sodium: 141 mmol/L (ref 135–145)

## 2019-05-30 LAB — MAGNESIUM: Magnesium: 2.2 mg/dL (ref 1.7–2.4)

## 2019-05-30 LAB — GLUCOSE, CAPILLARY
Glucose-Capillary: 96 mg/dL (ref 70–99)
Glucose-Capillary: 121 mg/dL — ABNORMAL HIGH (ref 70–99)
Glucose-Capillary: 104 mg/dL — ABNORMAL HIGH (ref 70–99)
Glucose-Capillary: 110 mg/dL — ABNORMAL HIGH (ref 70–99)

## 2019-05-30 NOTE — Progress Notes (Signed)
PROGRESS NOTE  Joshua Massey [id number] DOB: 08-Mar-1976   PCP: [name], [name], MD  Patient is from: Home.  Uses cane and walker at baseline.  DOA: 05/21/2019 LOS: 9  Brief Narrative / Interim history: 43 year old male with history of DM-2, HTN, CKD-4, lower extremity diabetic wound followed by Dr. Sharol Given, paroxysmal A. fib on Eliquis, combined CHF presented with shortness of breath and edema in the setting of missed doses of diuretics and dietary indiscretion, and admitted for acute on chronic CHF.  On IV Lasix drip per advanced heart failure as well as intermittent doses of metolazone.  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  LHC with nonobstructive CAD in 2013.  Ran out of his torsemide.  Also dietary indiscretion.  CXR consistent with CHF.  BNP elevated.  Started on Lasix drip-UOP 2800 mL/in last 24 hours  Net> - 21 L.   Renal function is stable.. -Advanced heart failure managing-continue Lasix drip and metolazone -Continue Coreg and BiDil -No ACEI/ARNI/ARB/Aldactone due to renal function -Appreciate dietitian input about dietary counseling -Counseled on the importance of compliance  Uncontrolled DM-2 with CKD-4: A1c 8.5% in 2019. CBG (last 3)  Recent Labs    05/25/19 1639 05/25/19 2109 05/26/19 0622  GLUCAP 124* 106* 95  -Blood sugar controlled.  Continue SSI.  AKI on CKD-4/azotemia: Cr 3.6-3.7 (baseline)> 4.95 (admit)>>> 4.48> 4.36> 4.37> 4.36-likely due to cardiorenal syndrome.  Has plateaued now. -Nephrology on board, appreciate their help and management deferred to them.  Anemia: Combination of ACD and IDA: Hgb 10-11 (baseline)> 11.5>> 11.3> 11.2> 10.5.  Anemia panel consistent with iron deficiency.  Stable. -IV ferric gluconate on 05/25/2019. -Continue p.o. ferrous sulfate with bowel regimen  Paroxysmal A. Fib/flutter: NSR -Amiodarone and Eliquis per cardiology  Essential hypertension: Normotensive  -Cardiac meds as above  Physical deconditioning: Reportedly uses cane.  -PT/OT eval  Morbid obesity: BMI 49.41. -Encourage lifestyle change to lose weight -Could benefit from newer diabetic agents with cardiovascular benefits -Appreciate dietitian input.  Lower extremity superficial wounds: POA.  Followed by Dr. Sharol Given. -Appreciate wound care input-"patient has superficial wounds to the right posterior achilles area that measures 2 cm x 2 cm and is 100% pink and clean.  The right LE lateral aspect as two small abrasion that are also 100% pink and clean.  The left dorsal foot has a superficial wound that measures 1.6 cm x 4 cm and is 100% clean and pink.  The patient states these started from compression stockings that rolled and pleated and caused pressure to the areas.   Patient states he has worn unna boots in the past and they worked well for him.  Recruitment consultant boots bilaterally, change weekly per WOC    DVT prophylaxis: On Eliquis for A. fib Code Status: Full code Family Communication: No family present.  Patient alert and oriented.  Discussed plan of care with him and he verbalized understanding. Disposition Plan: Remains inpatient for adequate diuresis.  Discharge when cleared by cardiology. Consultants: Advanced HF and nephrology  Subjective: Seen and examined.  Feels better.  Still with some shortness of breath when he walks too many steps.  No shortness of breath at rest.  Edema is improved a little bit today.  Objective: Vitals:   05/29/19 1605 05/29/19 2000 05/30/19 0500 05/30/19 0827  BP: (!) 129/98 127/88  128/87  Pulse: 72 69  60  Resp: 18 16  18   Temp: 97.7 F (36.5  C) 98 F (36.7 C)  98.3 F (36.8 C)  TempSrc: Oral Oral  Oral  SpO2: 97% 93%  95%  Weight:   (!) 151.2 kg   Height:        Intake/Output Summary (Last 24 hours) at 05/30/2019 0941 Last data filed at 05/30/2019 [id number] Gross per 24 hour  Intake -  Output 2100 ml  Net -2100 ml   Filed Weights   05/28/19  0551 05/29/19 0417 05/30/19 0500  Weight: (!) 153.4 kg (!) 151.4 kg (!) 151.2 kg    Examination:  General exam: Appears calm and comfortable, morbidly obese Respiratory system: Fine crackles at the bases. Respiratory effort normal. Cardiovascular system: S1 & S2 heard, RRR. No JVD, murmurs, rubs, gallops or clicks.  +1-2 pitting edema bilateral lower extremity. Gastrointestinal system: Abdomen is nondistended, soft and nontender. No organomegaly or masses felt. Normal bowel sounds heard. Central nervous system: Alert and oriented. No focal neurological deficits. Extremities: Symmetric 5 x 5 power. Skin: No rashes, lesions or ulcers.  Psychiatry: Judgement and insight appear normal. Mood & affect appropriate.     Procedures:  None   Microbiology summarized: None  Sch Meds:  Scheduled Meds: . amiodarone  200 mg Oral Daily  . apixaban  5 mg Oral BID  . atorvastatin  10 mg Oral Daily  . carvedilol  25 mg Oral BID WC  . ferrous sulfate  325 mg Oral BID  . influenza vac split quadrivalent PF  0.5 mL Intramuscular Tomorrow-1000  . insulin aspart  0-20 Units Subcutaneous TID WC  . isosorbide-hydrALAZINE  2 tablet Oral TID  . potassium chloride  10 mEq Oral Daily  . sodium chloride flush  3 mL Intravenous Q12H   Continuous Infusions: . sodium chloride    . furosemide (LASIX) infusion 12 mg/hr (05/29/19 1607)   PRN Meds:.sodium chloride, acetaminophen, gabapentin, ondansetron (ZOFRAN) IV, oxyCODONE-acetaminophen, silver sulfADIAZINE, sodium chloride flush  Antimicrobials: Anti-infectives (From admission, onward)   None       I have personally reviewed the following labs and images: CBC: Recent Labs  Lab 05/24/19 0246 05/25/19 0235 05/26/19 0310 05/27/19 0242 05/28/19 0256  WBC 6.0 5.8 5.8 5.3 4.8  HGB 11.0* 11.3* 11.2* 10.5* 10.7*  HCT 34.0* 34.4* 35.0* 32.6* 32.9*  MCV 93.4 92.2 94.9 93.1 92.2  PLT 170 182 182 176 183   BMP &GFR Recent Labs  Lab 05/26/19  0310 05/27/19 0242 05/28/19 0256 05/29/19 0151 05/30/19 0314  NA 142 141 140 140 141  K 4.1 4.0 4.0 4.0 4.2  CL 105 105 104 104 102  CO2 22 24 25 24 26   GLUCOSE 85 97 105* 105* 97  BUN 91* 91* 92* 97* 95*  CREATININE 4.36* 4.37* 4.36* 4.37* 4.62*  CALCIUM 8.6* 8.6* 8.5* 8.7* 8.4*  MG 2.3 2.3 2.1 2.1 2.2  PHOS 4.6 4.8* 4.7* 4.4 4.5   Estimated Creatinine Clearance: 31.1 mL/min (A) (by C-G formula based on SCr of 4.62 mg/dL (H)). Liver & Pancreas: Recent Labs  Lab 05/26/19 0310 05/27/19 0242 05/28/19 0256 05/29/19 0151 05/30/19 0314  ALBUMIN 2.5* 2.5* 2.4* 2.7* 2.8*   No results for input(s): LIPASE, AMYLASE in the last 168 hours. No results for input(s): AMMONIA in the last 168 hours. Diabetic: No results for input(s): HGBA1C in the last 72 hours. Recent Labs  Lab 05/29/19 0637 05/29/19 1107 05/29/19 1635 05/29/19 2105 05/30/19 0639  GLUCAP 98 119* 137* 109* 96   Cardiac Enzymes: No results for input(s): CKTOTAL, CKMB, CKMBINDEX, TROPONINI  in the last 168 hours. No results for input(s): PROBNP in the last 8760 hours. Coagulation Profile: No results for input(s): INR, PROTIME in the last 168 hours. Thyroid Function Tests: No results for input(s): TSH, T4TOTAL, FREET4, T3FREE, THYROIDAB in the last 72 hours. Lipid Profile: No results for input(s): CHOL, HDL, LDLCALC, TRIG, CHOLHDL, LDLDIRECT in the last 72 hours. Anemia Panel: No results for input(s): VITAMINB12, FOLATE, FERRITIN, TIBC, IRON, RETICCTPCT in the last 72 hours. Urine analysis:    Component Value Date/Time   COLORURINE YELLOW 05/21/2019 1724   APPEARANCEUR CLEAR 05/21/2019 1724   LABSPEC 1.012 05/21/2019 1724   PHURINE 5.0 05/21/2019 1724   GLUCOSEU NEGATIVE 05/21/2019 1724   HGBUR MODERATE (A) 05/21/2019 1724   BILIRUBINUR NEGATIVE 05/21/2019 1724   KETONESUR NEGATIVE 05/21/2019 1724   PROTEINUR 100 (A) 05/21/2019 1724   UROBILINOGEN 0.2 09/02/2009 0935   NITRITE NEGATIVE 05/21/2019 1724    LEUKOCYTESUR NEGATIVE 05/21/2019 1724   Sepsis Labs: Invalid input(s): PROCALCITONIN, [place]  Microbiology: No results found for this or any previous visit (from the past 240 hour(s)).  Radiology Studies: No results found.  Total time spent 28 minutes Darliss Cheney, MD [facility]  If 7PM-7AM, please contact night-coverage [email or web address] Password [email or web address] 05/30/2019, 9:41 AM

## 2019-05-30 NOTE — Progress Notes (Signed)
Physical Therapy Treatment Patient Details Name: Joshua Massey MRN: [id number] DOB: 07/07/1975 Today's Date: 05/30/2019    History of Present Illness Pt is a 43 year old male with history of DM-2, HTN, CKD-4, lower extremity diabetic wound followed by Dr. Sharol Given, paroxysmal A. fib on Eliquis, combined CHF presented with shortness of breath and edema in the setting of doses of diuretics and dietary indiscretion, and admitted for acute on chronic CHF.     PT Comments    Patient seen for mobility progression. Pt continues to progress toward PT goals and tolerated increased gait distance without c/o SOB. HR in 80s and SpO2 90% on RA while mobilizing. Use of SPC today for gait training. Still with c/o bilat LE discomfort/weakness and unna boots. Pt will continue to benefit from further skilled PT services to maximize independence and safety with mobility.    Follow Up Recommendations  Home health PT;Supervision for mobility/OOB     Equipment Recommendations  Rolling walker with 5" wheels(bariatric)    Recommendations for Other Services       Precautions / Restrictions Precautions Precautions: Fall Restrictions Weight Bearing Restrictions: No    Mobility  Bed Mobility Overal bed mobility: Modified Independent Bed Mobility: Supine to Sit           General bed mobility comments: use of rail and HOB elevated  Transfers Overall transfer level: Modified independent   Transfers: Sit to/from Stand              Ambulation/Gait Ambulation/Gait assistance: Supervision;Min guard Gait Distance (Feet): 400 Feet Assistive device: Straight cane Gait Pattern/deviations: Step-through pattern;Wide base of support;Decreased step length - right;Decreased step length - left Gait velocity: decreased   General Gait Details: pt demonstrates safe use of SPC and without LOB; lateral sway and wide BOS; pt expresses discomfort/weakness in bilat LE    Stairs             Wheelchair  Mobility    Modified Rankin (Stroke Patients Only)       Balance Overall balance assessment: Needs assistance Sitting-balance support: Feet supported Sitting balance-Leahy Scale: Good     Standing balance support: No upper extremity supported;During functional activity Standing balance-Leahy Scale: Fair Standing balance comment: pt is able to static stand without UE support                             Cognition Arousal/Alertness: Awake/alert Behavior During Therapy: WFL for tasks assessed/performed Overall Cognitive Status: Within Functional Limits for tasks assessed                                        Exercises      General Comments General comments (skin integrity, edema, etc.): HR in 80s and SpO2 90% on RA while ambulating; pt denies SOB      Pertinent Vitals/Pain Pain Assessment: No/denies pain    Home Living                      Prior Function            PT Goals (current goals can now be found in the care plan section) Progress towards PT goals: Progressing toward goals    Frequency    Min 3X/week      PT Plan Current plan remains appropriate    Co-evaluation  AM-PAC PT "6 Clicks" Mobility   Outcome Measure  Help needed turning from your back to your side while in a flat bed without using bedrails?: None Help needed moving from lying on your back to sitting on the side of a flat bed without using bedrails?: None Help needed moving to and from a bed to a chair (including a wheelchair)?: A Little Help needed standing up from a chair using your arms (e.g., wheelchair or bedside chair)?: A Little Help needed to walk in hospital room?: A Little Help needed climbing 3-5 steps with a railing? : A Little 6 Click Score: 20    End of Session   Activity Tolerance: Patient tolerated treatment well Patient left: with call bell/phone within reach(pt sitting EOB to eat breakfast) Nurse  Communication: Mobility status PT Visit Diagnosis: Unsteadiness on feet (R26.81);Muscle weakness (generalized) (M62.81);Difficulty in walking, not elsewhere classified (R26.2)     Time: [id number] PT Time Calculation (min) (ACUTE ONLY): 20 min  Charges:  $Gait Training: 8-22 mins                     Earney Navy, PTA Acute Rehabilitation Services Pager: [phone] Office: [phone]     Darliss Cheney 05/30/2019, 9:15 AM

## 2019-05-30 NOTE — Progress Notes (Signed)
CARDIAC REHAB PHASE I   PRE:  Rate/Rhythm: 73 SR    BP: sitting 129/93    SaO2: 93 RA  MODE:  Ambulation: 400 ft   POST:  Rate/Rhythm: 85 SR    BP: sitting 129/89     SaO2: 92 RA  Pt is now moving independently. Pushed IV pole. Rest x1 in hall for DOE. VSS. Encouraged walking. [place], ACSM 05/30/2019 3:09 PM

## 2019-05-30 NOTE — Progress Notes (Addendum)
Advanced Heart Failure Rounding Note  PCP-Cardiologist: Dr. Aundra Dubin   Subjective:    Yesterday diuresed with lasix drip + metolazone. Weight unchanged? Negative 2.8 liters.   Denies SOB.    Objective:   Weight Range: (!) 151.2 kg Body mass index is 46.49 kg/m.   Vital Signs:   Temp:  [97.7 F (36.5 C)-98 F (36.7 C)] 98 F (36.7 C) (12/09 2000) Pulse Rate:  [69-75] 69 (12/09 2000) Resp:  [15-18] 16 (12/09 2000) BP: (126-129)/(80-98) 127/88 (12/09 2000) SpO2:  [93 %-98 %] 93 % (12/09 2000) Weight:  [151.2 kg] 151.2 kg (12/10 0500) Last BM Date: 05/29/19  Weight change: Filed Weights   05/28/19 0551 05/29/19 0417 05/30/19 0500  Weight: (!) 153.4 kg (!) 151.4 kg (!) 151.2 kg    Intake/Output:   Intake/Output Summary (Last 24 hours) at 05/30/2019 0800 Last data filed at 05/30/2019 0311 Gross per 24 hour  Intake -  Output 2800 ml  Net -2800 ml      Physical Exam    PHYSICAL EXAM: General:   No resp difficulty HEENT: normal Neck: supple. JVP to jaw . Carotids 2+ bilat; no bruits. No lymphadenopathy or thryomegaly appreciated. Cor: PMI nondisplaced. Regular rate & rhythm. No rubs, gallops or murmurs. Lungs: clear Abdomen: soft, nontender, nondistended. No hepatosplenomegaly. No bruits or masses. Good bowel sounds. Extremities: no cyanosis, clubbing, rash, R and LLE 2+ edema Neuro: alert & orientedx3, cranial nerves grossly intact. moves all 4 extremities w/o difficulty. Affect pleasant   Telemetry  NSR 60-80s  EKG    N/a   Labs    CBC Recent Labs    05/28/19 0256  WBC 4.8  HGB 10.7*  HCT 32.9*  MCV 92.2  PLT [id number]   Basic Metabolic Panel Recent Labs    05/29/19 0151 05/30/19 0314  NA 140 141  K 4.0 4.2  CL 104 102  CO2 24 26  GLUCOSE 105* 97  BUN 97* 95*  CREATININE 4.37* 4.62*  CALCIUM 8.7* 8.4*  MG 2.1 2.2  PHOS 4.4 4.5   Liver Function Tests Recent Labs    05/29/19 0151 05/30/19 0314  ALBUMIN 2.7* 2.8*   No results for  input(s): LIPASE, AMYLASE in the last 72 hours. Cardiac Enzymes No results for input(s): CKTOTAL, CKMB, CKMBINDEX, TROPONINI in the last 72 hours.  BNP: BNP (last 3 results) Recent Labs    05/25/19 0235 05/26/19 0310 05/27/19 0242  BNP 1,682.3* 1,444.0* 1,403.3*    ProBNP (last 3 results) No results for input(s): PROBNP in the last 8760 hours.   D-Dimer No results for input(s): DDIMER in the last 72 hours. Hemoglobin A1C No results for input(s): HGBA1C in the last 72 hours. Fasting Lipid Panel No results for input(s): CHOL, HDL, LDLCALC, TRIG, CHOLHDL, LDLDIRECT in the last 72 hours. Thyroid Function Tests No results for input(s): TSH, T4TOTAL, T3FREE, THYROIDAB in the last 72 hours.  Invalid input(s): FREET3  Other results:   Imaging    No results found.   Medications:     Scheduled Medications: . amiodarone  200 mg Oral Daily  . apixaban  5 mg Oral BID  . atorvastatin  10 mg Oral Daily  . carvedilol  25 mg Oral BID WC  . ferrous sulfate  325 mg Oral BID  . influenza vac split quadrivalent PF  0.5 mL Intramuscular Tomorrow-1000  . insulin aspart  0-20 Units Subcutaneous TID WC  . isosorbide-hydrALAZINE  2 tablet Oral TID  . potassium chloride  10  mEq Oral Daily  . sodium chloride flush  3 mL Intravenous Q12H    Infusions: . sodium chloride    . furosemide (LASIX) infusion 12 mg/hr (05/29/19 1607)    PRN Medications: sodium chloride, acetaminophen, gabapentin, ondansetron (ZOFRAN) IV, oxyCODONE-acetaminophen, silver sulfADIAZINE, sodium chloride flush     Assessment/Plan  1. Acute on chronic systolic CHF: Echo in 0000000 showed EF 20-25% with mildly dilated LV, normal RV.  Suspected nonischemic cardiomyopathy, has had low EF known since 2013, at that time had cath with nonobstructive CAD.  He ran out of his meds at home (torsemide in particular) and had significant dietary indiscretion around Thanksgiving.  Creatinine elevated at 4.95 from baseline in  the 3's at admission.  -Creatinine 4.6 today.  - Weight continues to trend down from 362-->333-->333 pounds.   Continue  Lasix gtt at 12 mg/hr. Hold off on metolazone today.  . No PICC due to possible need for HD down the road.  - Continue Coreg.  - Continue Bidil 2 tab tid.  - No ACEI/ARNI/ARB/spironolactone with CKD stage IV.  2. Atrial flutter: Paroxysmal.  - maintaining NSR.  - Continue 200 mg amiodarone daily.  - Continue apixaban.  3. AKI on CKD stage IV: Baseline creatinine 3s. Suspect cardiorenal effect with marked volume overload.   Nephrology following. Discussed fistula placement but he is not sure he wants to pursue.  Creatinine trending up. 4.4>4.6  4. Type II diabetes: Poor control. management per primary team.    Length of Stay: [place], NP  05/30/2019, 8:00 AM  Advanced Heart Failure Team Pager [phone] (M-F; 7a - 4p)  Please contact [facility] for night-coverage after hours (4p -7a ) and weekends on [email or web address]  Patient seen with NP, agree with the above note.   He is still volume overloaded today, diuresed well again yesterday with weight down.  Creatinine is higher today, agree with continuing Lasix gtt today and holding off on metolazone today.  Will likely restart metolazone tomorrow if creatinine stable.   Renal discussing dialysis access placement with him.   Loralie Champagne 05/30/2019 4:21 PM

## 2019-05-30 NOTE — Progress Notes (Signed)
UE vein mapping       has been completed. Preliminary results can be found under CV proc through chart review. Vonita Calloway, BS, RDMS, RVT   

## 2019-05-30 NOTE — Progress Notes (Signed)
Patient ID: Joshua Massey, male   DOB: Apr 11, 1976, 43 y.o.   MRN: [id number] [facility] Progress Note   Assessment/ Plan:   1. Acute kidney Injury on chronic kidney disease stage IV:  Likely secondary to chronic cardiorenal syndrome exacerbated by recent decompensation.  Urine output fair with furosemide/metolazone-agree with holding metolazone today with slight bump of his creatinine.  He informs me that he is "still thinking about dialysis access and does not want to see vascular surgery after"-I educated him on the pitfalls of this and the risks of crash start dialysis.  He informs me that his weight this morning was 328 pounds and not 333 as charted earlier.  I will go ahead and get vein mapping on him today. 2.  Acute exacerbation of chronic diastolic/systolic congestive heart failure: Secondary to nonadherence with medications/diet-remains volume overloaded and responding well to diuretics. . 3.  Atrial flutter: Adequately rate controlled on carvedilol/amiodarone, on anticoagulation with Eliquis. 4.  Hypertension: Blood pressure under acceptable control  Subjective:   Denies any acute events overnight, reluctant to have dialysis access placed.   Objective:   BP 128/87 (BP Location: Left Arm)   Pulse 60   Temp 98.3 F (36.8 C) (Oral)   Resp 18   Ht 5\' 11"  (1.803 m)   Wt (!) 151.2 kg   SpO2 95%   BMI 46.49 kg/m   Intake/Output Summary (Last 24 hours) at 05/30/2019 [id number] Last data filed at 05/30/2019 [id number] Gross per 24 hour  Intake -  Output 2100 ml  Net -2100 ml   Weight change: -0.165 kg  Physical Exam: Gen: Resting in bed, getting ready to work with PT CVS: Pulse regular rhythm, normal rate, S1 and S2 normal Resp: Distant breath sounds bilaterally, no distinct rales or rhonchi Abd: Soft, obese, nontender Ext: Bilateral Ace wraps, 1+ lower extremity edema  Imaging: No results found.  Labs: BMET Recent Labs  Lab 05/24/19 0246 05/25/19 0235  05/26/19 0310 05/27/19 0242 05/28/19 0256 05/29/19 0151 05/30/19 0314  NA 143 143 142 141 140 140 141  K 4.1 4.1 4.1 4.0 4.0 4.0 4.2  CL 107 108 105 105 104 104 102  CO2 22 23 22 24 25 24 26   GLUCOSE 109* 97 85 97 105* 105* 97  BUN 91* 92* 91* 91* 92* 97* 95*  CREATININE 4.56* 4.48* 4.36* 4.37* 4.36* 4.37* 4.62*  CALCIUM 8.6* 8.6* 8.6* 8.6* 8.5* 8.7* 8.4*  PHOS 4.4 4.4 4.6 4.8* 4.7* 4.4 4.5   CBC Recent Labs  Lab 05/25/19 0235 05/26/19 0310 05/27/19 0242 05/28/19 0256  WBC 5.8 5.8 5.3 4.8  HGB 11.3* 11.2* 10.5* 10.7*  HCT 34.4* 35.0* 32.6* 32.9*  MCV 92.2 94.9 93.1 92.2  PLT 182 182 176 183    Medications:    . amiodarone  200 mg Oral Daily  . apixaban  5 mg Oral BID  . atorvastatin  10 mg Oral Daily  . carvedilol  25 mg Oral BID WC  . ferrous sulfate  325 mg Oral BID  . influenza vac split quadrivalent PF  0.5 mL Intramuscular Tomorrow-1000  . insulin aspart  0-20 Units Subcutaneous TID WC  . isosorbide-hydrALAZINE  2 tablet Oral TID  . potassium chloride  10 mEq Oral Daily  . sodium chloride flush  3 mL Intravenous Q12H   Elmarie Shiley, MD 05/30/2019, 9:06 AM

## 2019-05-31 LAB — RENAL FUNCTION PANEL
Albumin: 2.8 g/dL — ABNORMAL LOW (ref 3.5–5.0)
Anion gap: 12 (ref 5–15)
BUN: 96 mg/dL — ABNORMAL HIGH (ref 6–20)
CO2: 26 mmol/L (ref 22–32)
Calcium: 8.6 mg/dL — ABNORMAL LOW (ref 8.9–10.3)
Chloride: 102 mmol/L (ref 98–111)
Creatinine, Ser: 4.54 mg/dL — ABNORMAL HIGH (ref 0.61–1.24)
GFR calc Af Amer: 17 mL/min — ABNORMAL LOW (ref >60)
GFR calc non Af Amer: 15 mL/min — ABNORMAL LOW (ref >60)
Glucose, Bld: 112 mg/dL — ABNORMAL HIGH (ref 70–99)
Phosphorus: 4.5 mg/dL (ref 2.5–4.6)
Potassium: 4.2 mmol/L (ref 3.5–5.1)
Sodium: 140 mmol/L (ref 135–145)

## 2019-05-31 LAB — GLUCOSE, CAPILLARY
Glucose-Capillary: 95 mg/dL (ref 70–99)
Glucose-Capillary: 97 mg/dL (ref 70–99)
Glucose-Capillary: 121 mg/dL — ABNORMAL HIGH (ref 70–99)
Glucose-Capillary: 127 mg/dL — ABNORMAL HIGH (ref 70–99)

## 2019-05-31 MED ORDER — METOLAZONE 5 MG PO TABS
5.0000 mg | ORAL_TABLET | Freq: Once | ORAL | Status: AC
  Administered 2019-05-31: 5 mg via ORAL
  Filled 2019-05-31: qty 1

## 2019-05-31 NOTE — Progress Notes (Signed)
Patient ID: Joshua Massey, male   DOB: Mar 31, 1976, 43 y.o.   MRN: [id number]     Advanced Heart Failure Rounding Note  PCP-Cardiologist: Dr. Aundra Dubin   Subjective:    Yesterday diuresed with lasix drip. Weight down another 3 lbs.     Denies SOB. Has not wanted vascular surgery consult for HD access.    Objective:   Weight Range: (!) 147.4 kg Body mass index is 45.33 kg/m.   Vital Signs:   Temp:  [98.6 F (37 C)] 98.6 F (37 C) (12/11 0317) Pulse Rate:  [70-75] 75 (12/11 0317) Resp:  [15-16] 16 (12/11 0317) BP: (127-133)/(92-96) 133/96 (12/11 0317) SpO2:  [95 %-96 %] 96 % (12/11 0317) Weight:  [147.4 kg] 147.4 kg (12/11 0317) Last BM Date: 05/30/19  Weight change: Filed Weights   05/30/19 0500 05/30/19 0827 05/31/19 0317  Weight: (!) 151.2 kg (!) 148.9 kg (!) 147.4 kg    Intake/Output:   Intake/Output Summary (Last 24 hours) at 05/31/2019 1330 Last data filed at 05/31/2019 1135 Gross per 24 hour  Intake 729.49 ml  Output 3425 ml  Net -2695.51 ml      Physical Exam    General: NAD Neck: JVP 14 cm, no thyromegaly or thyroid nodule.  Lungs: Clear to auscultation bilaterally with normal respiratory effort. CV: Lateral PMI.  Heart regular S1/S2, no S3/S4, no murmur.  2+ chronic edema to thighs.   Abdomen: Soft, nontender, no hepatosplenomegaly, no distention.  Skin: Intact without lesions or rashes.  Neurologic: Alert and oriented x 3.  Psych: Normal affect. Extremities: No clubbing or cyanosis.  HEENT: Normal.    Telemetry  NSR 60-80s (personally reviewed)  EKG    N/a   Labs    CBC No results for input(s): WBC, NEUTROABS, HGB, HCT, MCV, PLT in the last 72 hours. Basic Metabolic Panel Recent Labs    05/29/19 0151 05/30/19 0314 05/31/19 0305  NA 140 141 140  K 4.0 4.2 4.2  CL 104 102 102  CO2 24 26 26   GLUCOSE 105* 97 112*  BUN 97* 95* 96*  CREATININE 4.37* 4.62* 4.54*  CALCIUM 8.7* 8.4* 8.6*  MG 2.1 2.2  --   PHOS 4.4 4.5 4.5   Liver  Function Tests Recent Labs    05/30/19 0314 05/31/19 0305  ALBUMIN 2.8* 2.8*   No results for input(s): LIPASE, AMYLASE in the last 72 hours. Cardiac Enzymes No results for input(s): CKTOTAL, CKMB, CKMBINDEX, TROPONINI in the last 72 hours.  BNP: BNP (last 3 results) Recent Labs    05/25/19 0235 05/26/19 0310 05/27/19 0242  BNP 1,682.3* 1,444.0* 1,403.3*    ProBNP (last 3 results) No results for input(s): PROBNP in the last 8760 hours.   D-Dimer No results for input(s): DDIMER in the last 72 hours. Hemoglobin A1C No results for input(s): HGBA1C in the last 72 hours. Fasting Lipid Panel No results for input(s): CHOL, HDL, LDLCALC, TRIG, CHOLHDL, LDLDIRECT in the last 72 hours. Thyroid Function Tests No results for input(s): TSH, T4TOTAL, T3FREE, THYROIDAB in the last 72 hours.  Invalid input(s): FREET3  Other results:   Imaging    VAS [place] UPPER EXT VEIN MAPPING (PRE-OP AVF)  Result Date: 05/30/2019 UPPER EXTREMITY VEIN MAPPING  Indications: Pre-access. Performing Technologist: June Leap RDMS, RVT  Examination Guidelines: A complete evaluation includes B-mode imaging, spectral Doppler, color Doppler, and power Doppler as needed of all accessible portions of each vessel. Bilateral testing is considered an integral part of a complete examination. Limited  examinations for reoccurring indications may be performed as noted. +-----------------+-------------+----------+---------+  Right Cephalic    Diameter (cm) Depth (cm) Findings   +-----------------+-------------+----------+---------+  Shoulder              0.34         0.29               +-----------------+-------------+----------+---------+  Mid upper arm         0.36         0.37    branching  +-----------------+-------------+----------+---------+  Dist upper arm        0.37         0.38               +-----------------+-------------+----------+---------+  Antecubital fossa     0.45         0.21                +-----------------+-------------+----------+---------+  Prox forearm          0.57         0.41    branching  +-----------------+-------------+----------+---------+  Mid forearm           0.37         0.36               +-----------------+-------------+----------+---------+  Dist forearm          0.39         0.29               +-----------------+-------------+----------+---------+ +-----------------+-------------+----------+---------+  Right Basilic     Diameter (cm) Depth (cm) Findings   +-----------------+-------------+----------+---------+  Shoulder              0.40         0.35               +-----------------+-------------+----------+---------+  Mid upper arm         0.43         0.35    branching  +-----------------+-------------+----------+---------+  Dist upper arm        0.50         0.23               +-----------------+-------------+----------+---------+  Antecubital fossa     0.47                  IV site   +-----------------+-------------+----------+---------+  Prox forearm          0.45         0.27               +-----------------+-------------+----------+---------+  Mid forearm           0.41         0.26               +-----------------+-------------+----------+---------+  Distal forearm        0.35         0.24               +-----------------+-------------+----------+---------+ *See table(s) above for measurements and observations.  Diagnosing physician: Monica Martinez MD Electronically signed by Monica Martinez MD on 05/30/2019 at 6:41:15 PM.    Final      Medications:     Scheduled Medications:  amiodarone  200 mg Oral Daily   apixaban  5 mg Oral BID   atorvastatin  10 mg Oral Daily   carvedilol  25 mg Oral BID WC   ferrous sulfate  325 mg Oral BID   influenza vac split quadrivalent PF  0.5 mL Intramuscular Tomorrow-1000   insulin aspart  0-20 Units Subcutaneous TID WC   isosorbide-hydrALAZINE  2 tablet Oral TID   potassium chloride  10 mEq Oral Daily   sodium  chloride flush  3 mL Intravenous Q12H    Infusions:  sodium chloride     furosemide (LASIX) infusion 12 mg/hr (05/31/19 0100)    PRN Medications: sodium chloride, acetaminophen, gabapentin, ondansetron (ZOFRAN) IV, oxyCODONE-acetaminophen, silver sulfADIAZINE, sodium chloride flush     Assessment/Plan   1. Acute on chronic systolic CHF: Echo in 0000000 showed EF 20-25% with mildly dilated LV, normal RV.  Suspected nonischemic cardiomyopathy, has had low EF known since 2013, at that time had cath with nonobstructive CAD.  He ran out of his meds at home (torsemide in particular) and had significant dietary indiscretion around Thanksgiving.  Creatinine elevated at 4.95 from baseline in the 3's at admission. Creatinine stable today at 4.54. Weight trending down steadily, still volume overloaded.  - Continue  Lasix gtt at 12 mg/hr. I will give a dose of metolazone today.  - No PICC due to possible need for HD down the road.  - Continue Coreg 25 mg bid.  - Continue Bidil 2 tab tid.  - No ACEI/ARNI/ARB/spironolactone with CKD stage IV.  2. Atrial flutter: Paroxysmal. Maintaining NSR.  - Continue 200 mg amiodarone daily.  - Continue apixaban.  3. AKI on CKD stage IV: Baseline creatinine 3s. Suspect cardiorenal effect with marked volume overload.   Nephrology following. Discussed fistula placement but he is not sure he wants to pursue.  4. Type II diabetes: Poor control. management per primary team.   Length of Stay: [place], MD  05/31/2019, 1:30 PM  Advanced Heart Failure Team Pager [phone] (M-F; 7a - 4p)  Please contact [facility] for night-coverage after hours (4p -7a ) and weekends on [email or web address]

## 2019-05-31 NOTE — Progress Notes (Signed)
Physical Therapy Treatment Patient Details Name: Joshua Massey MRN: [id number] DOB: 05/25/1976 Today's Date: 05/31/2019    History of Present Illness Pt is a 43 year old male with history of DM-2, HTN, CKD-4, lower extremity diabetic wound followed by Dr. Sharol Given, paroxysmal A. fib on Eliquis, combined CHF presented with shortness of breath and edema in the setting of doses of diuretics and dietary indiscretion, and admitted for acute on chronic CHF.     PT Comments    Patient seen for mobility progression. Pt requires min guard assist for safety with gait training using SPC. Pt tolerated gait distance of 400 ft with one standing rest break due to DOE. VSS on RA. Pt with unsteady gait at times but without LOB. Continue to progress as tolerated.     Follow Up Recommendations  Home health PT;Supervision for mobility/OOB     Equipment Recommendations  Rolling walker with 5" wheels(bariatric)    Recommendations for Other Services       Precautions / Restrictions Precautions Precautions: Fall Restrictions Weight Bearing Restrictions: No    Mobility  Bed Mobility Overal bed mobility: Modified Independent Bed Mobility: Supine to Sit           General bed mobility comments: use of rail and HOB flat  Transfers Overall transfer level: Modified independent   Transfers: Sit to/from Stand              Ambulation/Gait Ambulation/Gait assistance: Min guard Gait Distance (Feet): 400 Feet Assistive device: Straight cane Gait Pattern/deviations: Step-through pattern;Wide base of support;Decreased step length - right;Decreased step length - left;Drifts right/left Gait velocity: decreased   General Gait Details: min guard assist for safety as pt demonstrates unsteady gait at times; no LOB or physical assist needed; one standing rest break required due to DOE   [employer] Mobility    Modified Rankin (Stroke Patients Only)       Balance  Overall balance assessment: Needs assistance Sitting-balance support: Feet supported Sitting balance-Leahy Scale: Good     Standing balance support: No upper extremity supported;During functional activity Standing balance-Leahy Scale: Fair Standing balance comment: pt is able to static stand without UE support                             Cognition Arousal/Alertness: Awake/alert Behavior During Therapy: WFL for tasks assessed/performed Overall Cognitive Status: Within Functional Limits for tasks assessed                                        Exercises      General Comments General comments (skin integrity, edema, etc.): HR in 90s and SpO2 94% on RA while ambulating      Pertinent Vitals/Pain Pain Assessment: No/denies pain    Home Living                      Prior Function            PT Goals (current goals can now be found in the care plan section) Progress towards PT goals: Progressing toward goals    Frequency    Min 3X/week      PT Plan Current plan remains appropriate    Co-evaluation              AM-PAC  PT "6 Clicks" Mobility   Outcome Measure  Help needed turning from your back to your side while in a flat bed without using bedrails?: None Help needed moving from lying on your back to sitting on the side of a flat bed without using bedrails?: None Help needed moving to and from a bed to a chair (including a wheelchair)?: None Help needed standing up from a chair using your arms (e.g., wheelchair or bedside chair)?: None Help needed to walk in hospital room?: A Little Help needed climbing 3-5 steps with a railing? : A Little 6 Click Score: 22    End of Session Equipment Utilized During Treatment: Gait belt Activity Tolerance: Patient tolerated treatment well Patient left: with call bell/phone within reach(pt sitting EOB ) Nurse Communication: Mobility status PT Visit Diagnosis: Unsteadiness on feet  (R26.81);Muscle weakness (generalized) (M62.81);Difficulty in walking, not elsewhere classified (R26.2)     Time: [id number] PT Time Calculation (min) (ACUTE ONLY): 24 min  Charges:  $Gait Training: 23-37 mins                     Earney Navy, PTA Acute Rehabilitation Services Pager: [phone] Office: [phone]     Darliss Cheney 05/31/2019, 11:13 AM

## 2019-05-31 NOTE — Progress Notes (Signed)
Orthopedic Tech Progress Note Patient Details:  Joshua Massey 10/07/1975 [id number]  Ortho Devices Type of Ortho Device: Louretta Parma boot Ortho Device/Splint Location: Bilateral Ortho Device/Splint Interventions: Ordered, Application   Post Interventions Patient Tolerated: Well Instructions Provided: Care of device   Staci Righter 05/31/2019, 2:08 PM

## 2019-05-31 NOTE — Progress Notes (Signed)
Patient ID: Joshua Massey, male   DOB: [birth year], 43 y.o.   MRN: [id number] [name] Progress Note   Assessment/ Plan:   1. Acute kidney Injury on chronic kidney disease stage IV:  Likely secondary to chronic cardiorenal syndrome exacerbated by recent decompensation.  Fair urine output with rather unchanged renal function overnight.  He declines vascular surgery evaluation at this time recognizing the risks of crash start dialysis.  He unfortunately has a long and illustrious history of not keeping his outpatient nephrology clinic appointments.  Fixed urine output of around 3 L with concomitant weight loss. 2.  Acute exacerbation of chronic diastolic/systolic congestive heart failure: Secondary to nonadherence with medications/diet-remains volume overloaded and continues net negative fluid balance with concomitant weight loss. 3.  Atrial flutter: Adequately rate controlled on carvedilol/amiodarone, on anticoagulation with Eliquis. 4.  Hypertension: Blood pressure under acceptable control  Subjective:   Denies any acute events overnight, refuses vascular surgery consult for dialysis access placement/evaluation.   Objective:   BP (!) 133/96 (BP Location: Left Arm)   Pulse 75   Temp 98.6 F (37 C) (Oral)   Resp 16   Ht 5\' 11"  (1.803 m)   Wt (!) 147.4 kg Comment: scale A  SpO2 96%   BMI 45.33 kg/m   Intake/Output Summary (Last 24 hours) at 05/31/2019 [id number] Last data filed at 05/31/2019 [id number] Gross per 24 hour  Intake 489.49 ml  Output 2900 ml  Net -2410.51 ml   Weight change: -2.3 kg  Physical Exam: Gen: Sitting comfortably on the side of his bed CVS: Pulse regular rhythm, normal rate, S1 and S2 normal Resp: Distant breath sounds bilaterally, no distinct rales or rhonchi Abd: Soft, obese, nontender Ext: 1+ lower extremity edema  Imaging: VAS [place] UPPER EXT VEIN MAPPING (PRE-OP AVF)  Result Date: 05/30/2019 UPPER EXTREMITY VEIN MAPPING  Indications: Pre-access.  Performing Technologist: June Leap RDMS, RVT  Examination Guidelines: A complete evaluation includes B-mode imaging, spectral Doppler, color Doppler, and power Doppler as needed of all accessible portions of each vessel. Bilateral testing is considered an integral part of a complete examination. Limited examinations for reoccurring indications may be performed as noted. +-----------------+-------------+----------+---------+ Right Cephalic   Diameter (cm)Depth (cm)Findings  +-----------------+-------------+----------+---------+ Shoulder             0.34        0.29             +-----------------+-------------+----------+---------+ Mid upper arm        0.36        0.37   branching +-----------------+-------------+----------+---------+ Dist upper arm       0.37        0.38             +-----------------+-------------+----------+---------+ Antecubital fossa    0.45        0.21             +-----------------+-------------+----------+---------+ Prox forearm         0.57        0.41   branching +-----------------+-------------+----------+---------+ Mid forearm          0.37        0.36             +-----------------+-------------+----------+---------+ Dist forearm         0.39        0.29             +-----------------+-------------+----------+---------+ +-----------------+-------------+----------+---------+ Right Basilic    Diameter (cm)Depth (cm)Findings  +-----------------+-------------+----------+---------+ Shoulder  0.40        0.35             +-----------------+-------------+----------+---------+ Mid upper arm        0.43        0.35   branching +-----------------+-------------+----------+---------+ Dist upper arm       0.50        0.23             +-----------------+-------------+----------+---------+ Antecubital fossa    0.47                IV site  +-----------------+-------------+----------+---------+ Prox forearm         0.45         0.27             +-----------------+-------------+----------+---------+ Mid forearm          0.41        0.26             +-----------------+-------------+----------+---------+ Distal forearm       0.35        0.24             +-----------------+-------------+----------+---------+ *See table(s) above for measurements and observations.  Diagnosing physician: Monica Martinez MD Electronically signed by Monica Martinez MD on 05/30/2019 at 6:41:15 PM.    Final     Labs: BMET Recent Labs  Lab 05/25/19 0235 05/26/19 0310 05/27/19 [id number] 05/28/19 0256 05/29/19 0151 05/30/19 0314 05/31/19 0305  NA 143 142 141 140 140 141 140  K 4.1 4.1 4.0 4.0 4.0 4.2 4.2  CL 108 105 105 104 104 102 102  CO2 23 22 24 25 24 26 26   GLUCOSE 97 85 97 105* 105* 97 112*  BUN 92* 91* 91* 92* 97* 95* 96*  CREATININE 4.48* 4.36* 4.37* 4.36* 4.37* 4.62* 4.54*  CALCIUM 8.6* 8.6* 8.6* 8.5* 8.7* 8.4* 8.6*  PHOS 4.4 4.6 4.8* 4.7* 4.4 4.5 4.5   CBC Recent Labs  Lab 05/25/19 0235 05/26/19 0310 05/27/19 0242 05/28/19 0256  WBC 5.8 5.8 5.3 4.8  HGB 11.3* 11.2* 10.5* 10.7*  HCT 34.4* 35.0* 32.6* 32.9*  MCV 92.2 94.9 93.1 92.2  PLT 182 182 176 183    Medications:    . amiodarone  200 mg Oral Daily  . apixaban  5 mg Oral BID  . atorvastatin  10 mg Oral Daily  . carvedilol  25 mg Oral BID WC  . ferrous sulfate  325 mg Oral BID  . influenza vac split quadrivalent PF  0.5 mL Intramuscular Tomorrow-1000  . insulin aspart  0-20 Units Subcutaneous TID WC  . isosorbide-hydrALAZINE  2 tablet Oral TID  . potassium chloride  10 mEq Oral Daily  . sodium chloride flush  3 mL Intravenous Q12H   Elmarie Shiley, MD 05/31/2019, 9:26 AM

## 2019-05-31 NOTE — Progress Notes (Signed)
CARDIAC REHAB PHASE I   PRE:  Rate/Rhythm: 79 SR    BP: sitting 129/84    SaO2: 92 RA  MODE:  Ambulation: 470 ft   POST:  Rate/Rhythm: 85 SR    BP: sitting 136/68     SaO2: 93 RA  Tolerated well pushing IV pole, independent. Rest x2, increased distance.  [place], ACSM 05/31/2019 2:32 PM

## 2019-05-31 NOTE — Progress Notes (Signed)
PROGRESS NOTE  Joshua Massey [id number] DOB: 01-31-1976   PCP: [name], [name], MD  Patient is from: Home.  Uses cane and walker at baseline.  DOA: 05/21/2019 LOS: 10  Brief Narrative / Interim history: 43 year old male with history of DM-2, HTN, CKD-4, lower extremity diabetic wound followed by Dr. Sharol Given, paroxysmal A. fib on Eliquis, combined CHF presented with shortness of breath and edema in the setting of missed doses of diuretics and dietary indiscretion, and admitted for acute on chronic CHF.  On IV Lasix drip per advanced heart failure as well as intermittent doses of metolazone.  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  LHC with nonobstructive CAD in 2013.  Ran out of his torsemide.  Also dietary indiscretion.  CXR consistent with CHF.  BNP elevated.  Started on Lasix drip-UOP 2900 mL/in last 24 hours  Net> - 23244 mL.   Renal function is stable.. -Advanced heart failure managing-continue Lasix drip and metolazone -Continue Coreg and BiDil -No ACEI/ARNI/ARB/Aldactone due to renal function -Appreciate dietitian input about dietary counseling -Counseled on the importance of compliance  Uncontrolled DM-2 with CKD-4: A1c 8.5% in 2019. CBG (last 3)  Recent Labs    05/25/19 1639 05/25/19 2109 05/26/19 0622  GLUCAP 124* 106* 95  -Blood sugar controlled.  Continue SSI.  AKI on CKD-4/azotemia: Cr 3.6-3.7 (baseline)> 4.95 (admit)>>> 4.48> 4.36> 4.37> 4.36> 4.37> 4.62> 4.54-likely due to cardiorenal syndrome.  Has plateaued now. -Nephrology on board, appreciate their help and management deferred to them.  Patient has declined offer for having AV fistula.  Anemia: Combination of ACD and IDA: Hgb 10-11 (baseline)> 11.5>> 11.3> 11.2> 10.5.  Anemia panel consistent with iron deficiency.  Stable. -IV ferric gluconate on 05/25/2019. -Continue p.o. ferrous sulfate with bowel regimen  Paroxysmal A. Fib/flutter:  NSR -Amiodarone and Eliquis per cardiology  Essential hypertension: Normotensive -Cardiac meds as above  Physical deconditioning: Reportedly uses cane.  -PT/OT eval  Morbid obesity: BMI 49.41. -Encourage lifestyle change to lose weight -Could benefit from newer diabetic agents with cardiovascular benefits -Appreciate dietitian input.  Lower extremity superficial wounds: POA.  Followed by Dr. Sharol Given. -Appreciate wound care input-"patient has superficial wounds to the right posterior achilles area that measures 2 cm x 2 cm and is 100% pink and clean.  The right LE lateral aspect as two small abrasion that are also 100% pink and clean.  The left dorsal foot has a superficial wound that measures 1.6 cm x 4 cm and is 100% clean and pink.  The patient states these started from compression stockings that rolled and pleated and caused pressure to the areas.   Patient states he has worn unna boots in the past and they worked well for him.  Recruitment consultant boots bilaterally, change weekly per WOC    DVT prophylaxis: On Eliquis for A. fib Code Status: Full code Family Communication: No family present.  Patient alert and oriented.  Discussed plan of care with him and he verbalized understanding. Disposition Plan: Remains inpatient for adequate diuresis.  Discharge when cleared by cardiology. Consultants: Advanced HF and nephrology  Subjective: Patient seen and examined.  Denied any shortness of breath or chest pain.  No other complaint.  Objective: Vitals:   05/30/19 0500 05/30/19 0827 05/30/19 2003 05/31/19 0317  BP:  128/87 (!) 127/92 (!) 133/96  Pulse:  60 70 75  Resp:  18 15 16   Temp:  98.3 F (36.8 C) 98.6 F (  37 C) 98.6 F (37 C)  TempSrc:  Oral Oral Oral  SpO2:  95% 95% 96%  Weight: (!) 151.2 kg (!) 148.9 kg  (!) 147.4 kg  Height:        Intake/Output Summary (Last 24 hours) at 05/31/2019 1145 Last data filed at 05/31/2019 1135 Gross per 24 hour  Intake 729.49 ml  Output 3425 ml  Net  -2695.51 ml   Filed Weights   05/30/19 0500 05/30/19 0827 05/31/19 0317  Weight: (!) 151.2 kg (!) 148.9 kg (!) 147.4 kg    Examination:  General exam: Appears calm and comfortable, morbidly obese Respiratory system: Clear to auscultation. Respiratory effort normal. Cardiovascular system: S1 & S2 heard, RRR. No JVD, murmurs, rubs, gallops or clicks.  +2 pitting edema bilateral lower extremity. Gastrointestinal system: Abdomen is nondistended, soft and nontender. No organomegaly or masses felt. Normal bowel sounds heard. Central nervous system: Alert and oriented. No focal neurological deficits. Extremities: Symmetric 5 x 5 power. Skin: No rashes, lesions or ulcers.  Psychiatry: Judgement and insight appear normal. Mood & affect appropriate.   Procedures:  None   Microbiology summarized: None  Sch Meds:  Scheduled Meds: . amiodarone  200 mg Oral Daily  . apixaban  5 mg Oral BID  . atorvastatin  10 mg Oral Daily  . carvedilol  25 mg Oral BID WC  . ferrous sulfate  325 mg Oral BID  . influenza vac split quadrivalent PF  0.5 mL Intramuscular Tomorrow-1000  . insulin aspart  0-20 Units Subcutaneous TID WC  . isosorbide-hydrALAZINE  2 tablet Oral TID  . potassium chloride  10 mEq Oral Daily  . sodium chloride flush  3 mL Intravenous Q12H   Continuous Infusions: . sodium chloride    . furosemide (LASIX) infusion 12 mg/hr (05/31/19 0100)   PRN Meds:.sodium chloride, acetaminophen, gabapentin, ondansetron (ZOFRAN) IV, oxyCODONE-acetaminophen, silver sulfADIAZINE, sodium chloride flush  Antimicrobials: Anti-infectives (From admission, onward)   None       I have personally reviewed the following labs and images: CBC: Recent Labs  Lab 05/25/19 0235 05/26/19 0310 05/27/19 0242 05/28/19 0256  WBC 5.8 5.8 5.3 4.8  HGB 11.3* 11.2* 10.5* 10.7*  HCT 34.4* 35.0* 32.6* 32.9*  MCV 92.2 94.9 93.1 92.2  PLT 182 182 176 183   BMP &GFR Recent Labs  Lab 05/26/19 0310  05/27/19 0242 05/28/19 0256 05/29/19 0151 05/30/19 0314 05/31/19 0305  NA 142 141 140 140 141 140  K 4.1 4.0 4.0 4.0 4.2 4.2  CL 105 105 104 104 102 102  CO2 22 24 25 24 26 26   GLUCOSE 85 97 105* 105* 97 112*  BUN 91* 91* 92* 97* 95* 96*  CREATININE 4.36* 4.37* 4.36* 4.37* 4.62* 4.54*  CALCIUM 8.6* 8.6* 8.5* 8.7* 8.4* 8.6*  MG 2.3 2.3 2.1 2.1 2.2  --   PHOS 4.6 4.8* 4.7* 4.4 4.5 4.5   Estimated Creatinine Clearance: 31.2 mL/min (A) (by C-G formula based on SCr of 4.54 mg/dL (H)). Liver & Pancreas: Recent Labs  Lab 05/27/19 0242 05/28/19 0256 05/29/19 0151 05/30/19 0314 05/31/19 0305  ALBUMIN 2.5* 2.4* 2.7* 2.8* 2.8*   No results for input(s): LIPASE, AMYLASE in the last 168 hours. No results for input(s): AMMONIA in the last 168 hours. Diabetic: No results for input(s): HGBA1C in the last 72 hours. Recent Labs  Lab 05/30/19 1114 05/30/19 1612 05/30/19 2109 05/31/19 0609 05/31/19 1104  GLUCAP 121* 104* 110* 95 97   Cardiac Enzymes: No results for input(s):  CKTOTAL, CKMB, CKMBINDEX, TROPONINI in the last 168 hours. No results for input(s): PROBNP in the last 8760 hours. Coagulation Profile: No results for input(s): INR, PROTIME in the last 168 hours. Thyroid Function Tests: No results for input(s): TSH, T4TOTAL, FREET4, T3FREE, THYROIDAB in the last 72 hours. Lipid Profile: No results for input(s): CHOL, HDL, LDLCALC, TRIG, CHOLHDL, LDLDIRECT in the last 72 hours. Anemia Panel: No results for input(s): VITAMINB12, FOLATE, FERRITIN, TIBC, IRON, RETICCTPCT in the last 72 hours. Urine analysis:    Component Value Date/Time   COLORURINE YELLOW 05/21/2019 1724   APPEARANCEUR CLEAR 05/21/2019 1724   LABSPEC 1.012 05/21/2019 1724   PHURINE 5.0 05/21/2019 1724   GLUCOSEU NEGATIVE 05/21/2019 1724   HGBUR MODERATE (A) 05/21/2019 1724   BILIRUBINUR NEGATIVE 05/21/2019 1724   KETONESUR NEGATIVE 05/21/2019 1724   PROTEINUR 100 (A) 05/21/2019 1724   UROBILINOGEN 0.2  09/02/2009 0935   NITRITE NEGATIVE 05/21/2019 1724   LEUKOCYTESUR NEGATIVE 05/21/2019 1724   Sepsis Labs: Invalid input(s): PROCALCITONIN, [place]  Microbiology: No results found for this or any previous visit (from the past 240 hour(s)).  Radiology Studies: VAS [place] UPPER EXT VEIN MAPPING (PRE-OP AVF)  Result Date: 05/30/2019 UPPER EXTREMITY VEIN MAPPING  Indications: Pre-access. Performing Technologist: June Leap RDMS, RVT  Examination Guidelines: A complete evaluation includes B-mode imaging, spectral Doppler, color Doppler, and power Doppler as needed of all accessible portions of each vessel. Bilateral testing is considered an integral part of a complete examination. Limited examinations for reoccurring indications may be performed as noted. +-----------------+-------------+----------+---------+ Right Cephalic   Diameter (cm)Depth (cm)Findings  +-----------------+-------------+----------+---------+ Shoulder             0.34        0.29             +-----------------+-------------+----------+---------+ Mid upper arm        0.36        0.37   branching +-----------------+-------------+----------+---------+ Dist upper arm       0.37        0.38             +-----------------+-------------+----------+---------+ Antecubital fossa    0.45        0.21             +-----------------+-------------+----------+---------+ Prox forearm         0.57        0.41   branching +-----------------+-------------+----------+---------+ Mid forearm          0.37        0.36             +-----------------+-------------+----------+---------+ Dist forearm         0.39        0.29             +-----------------+-------------+----------+---------+ +-----------------+-------------+----------+---------+ Right Basilic    Diameter (cm)Depth (cm)Findings  +-----------------+-------------+----------+---------+ Shoulder             0.40        0.35              +-----------------+-------------+----------+---------+ Mid upper arm        0.43        0.35   branching +-----------------+-------------+----------+---------+ Dist upper arm       0.50        0.23             +-----------------+-------------+----------+---------+ Antecubital fossa    0.47  IV site  +-----------------+-------------+----------+---------+ Prox forearm         0.45        0.27             +-----------------+-------------+----------+---------+ Mid forearm          0.41        0.26             +-----------------+-------------+----------+---------+ Distal forearm       0.35        0.24             +-----------------+-------------+----------+---------+ *See table(s) above for measurements and observations.  Diagnosing physician: Monica Martinez MD Electronically signed by Monica Martinez MD on 05/30/2019 at 6:41:15 PM.    Final     Total time spent 26 minutes Darliss Cheney, MD [facility]  If 7PM-7AM, please contact night-coverage [email or web address] Password [email or web address] 05/31/2019, 11:45 AM

## 2019-06-01 LAB — RENAL FUNCTION PANEL
Albumin: 2.8 g/dL — ABNORMAL LOW (ref 3.5–5.0)
Anion gap: 12 (ref 5–15)
BUN: 92 mg/dL — ABNORMAL HIGH (ref 6–20)
CO2: 27 mmol/L (ref 22–32)
Calcium: 8.6 mg/dL — ABNORMAL LOW (ref 8.9–10.3)
Chloride: 102 mmol/L (ref 98–111)
Creatinine, Ser: 4.55 mg/dL — ABNORMAL HIGH (ref 0.61–1.24)
GFR calc Af Amer: 17 mL/min — ABNORMAL LOW
GFR calc non Af Amer: 15 mL/min — ABNORMAL LOW
Glucose, Bld: 112 mg/dL — ABNORMAL HIGH (ref 70–99)
Phosphorus: 4.6 mg/dL (ref 2.5–4.6)
Potassium: 4.4 mmol/L (ref 3.5–5.1)
Sodium: 141 mmol/L (ref 135–145)

## 2019-06-01 LAB — GLUCOSE, CAPILLARY
Glucose-Capillary: 94 mg/dL (ref 70–99)
Glucose-Capillary: 109 mg/dL — ABNORMAL HIGH (ref 70–99)
Glucose-Capillary: 108 mg/dL — ABNORMAL HIGH (ref 70–99)
Glucose-Capillary: 118 mg/dL — ABNORMAL HIGH (ref 70–99)

## 2019-06-01 MED ORDER — METOLAZONE 5 MG PO TABS
5.0000 mg | ORAL_TABLET | Freq: Once | ORAL | Status: AC
  Administered 2019-06-01: 5 mg via ORAL
  Filled 2019-06-01: qty 1

## 2019-06-01 NOTE — Progress Notes (Signed)
Patient ID: Joshua Massey, male   DOB: [birth year], 43 y.o.   MRN: [id number]     Advanced Heart Failure Rounding Note  PCP-Cardiologist: Dr. Aundra Dubin   Subjective:    Yesterday diuresed with lasix drip 12 mg/hr + metolazone 5 mg x 1. Weight down another 7 lbs.     Denies SOB. Has not wanted vascular surgery consult for HD access.    Objective:   Weight Range: (!) 144.6 kg Body mass index is 44.46 kg/m.   Vital Signs:   Temp:  [97.8 F (36.6 C)-98.2 F (36.8 C)] 98.2 F (36.8 C) (12/12 0300) Pulse Rate:  [73-77] 77 (12/12 0300) Resp:  [17-20] 19 (12/12 0300) BP: (132-141)/(86-91) 132/86 (12/12 0300) SpO2:  [96 %-97 %] 97 % (12/12 0300) Weight:  [144.6 kg] 144.6 kg (12/12 0300) Last BM Date: 05/30/19  Weight change: Filed Weights   05/30/19 0827 05/31/19 0317 06/01/19 0300  Weight: (!) 148.9 kg (!) 147.4 kg (!) 144.6 kg    Intake/Output:   Intake/Output Summary (Last 24 hours) at 06/01/2019 1151 Last data filed at 06/01/2019 0412 Gross per 24 hour  Intake 400.87 ml  Output 3075 ml  Net -2674.13 ml      Physical Exam    General: NAD Neck: JVP 14 cm, no thyromegaly or thyroid nodule.  Lungs: Clear to auscultation bilaterally with normal respiratory effort. CV: Nondisplaced PMI.  Heart regular S1/S2, no S3/S4, no murmur.  2+ chronic edema to thighs.   Abdomen: Soft, nontender, no hepatosplenomegaly, no distention.  Skin: Intact without lesions or rashes.  Neurologic: Alert and oriented x 3.  Psych: Normal affect. Extremities: No clubbing or cyanosis.  HEENT: Normal.    Telemetry  NSR 60-80s (personally reviewed)  EKG    N/a   Labs    CBC No results for input(s): WBC, NEUTROABS, HGB, HCT, MCV, PLT in the last 72 hours. Basic Metabolic Panel Recent Labs    05/30/19 0314 05/31/19 0305 06/01/19 0249  NA 141 140 141  K 4.2 4.2 4.4  CL 102 102 102  CO2 26 26 27   GLUCOSE 97 112* 112*  BUN 95* 96* 92*  CREATININE 4.62* 4.54* 4.55*  CALCIUM 8.4*  8.6* 8.6*  MG 2.2  --   --   PHOS 4.5 4.5 4.6   Liver Function Tests Recent Labs    05/31/19 0305 06/01/19 0249  ALBUMIN 2.8* 2.8*   No results for input(s): LIPASE, AMYLASE in the last 72 hours. Cardiac Enzymes No results for input(s): CKTOTAL, CKMB, CKMBINDEX, TROPONINI in the last 72 hours.  BNP: BNP (last 3 results) Recent Labs    05/25/19 0235 05/26/19 0310 05/27/19 0242  BNP 1,682.3* 1,444.0* 1,403.3*    ProBNP (last 3 results) No results for input(s): PROBNP in the last 8760 hours.   D-Dimer No results for input(s): DDIMER in the last 72 hours. Hemoglobin A1C No results for input(s): HGBA1C in the last 72 hours. Fasting Lipid Panel No results for input(s): CHOL, HDL, LDLCALC, TRIG, CHOLHDL, LDLDIRECT in the last 72 hours. Thyroid Function Tests No results for input(s): TSH, T4TOTAL, T3FREE, THYROIDAB in the last 72 hours.  Invalid input(s): FREET3  Other results:   Imaging    No results found.   Medications:     Scheduled Medications: . amiodarone  200 mg Oral Daily  . apixaban  5 mg Oral BID  . atorvastatin  10 mg Oral Daily  . carvedilol  25 mg Oral BID WC  . ferrous sulfate  325 mg Oral BID  . influenza vac split quadrivalent PF  0.5 mL Intramuscular Tomorrow-1000  . insulin aspart  0-20 Units Subcutaneous TID WC  . isosorbide-hydrALAZINE  2 tablet Oral TID  . metolazone  5 mg Oral Once  . potassium chloride  10 mEq Oral Daily  . sodium chloride flush  3 mL Intravenous Q12H    Infusions: . sodium chloride    . furosemide (LASIX) infusion 12 mg/hr (05/31/19 1531)    PRN Medications: sodium chloride, acetaminophen, gabapentin, ondansetron (ZOFRAN) IV, oxyCODONE-acetaminophen, silver sulfADIAZINE, sodium chloride flush     Assessment/Plan   1. Acute on chronic systolic CHF: Echo in 0000000 showed EF 20-25% with mildly dilated LV, normal RV.  Suspected nonischemic cardiomyopathy, has had low EF known since 2013, at that time had cath  with nonobstructive CAD.  He ran out of his meds at home (torsemide in particular) and had significant dietary indiscretion around Thanksgiving.  Creatinine elevated at 4.95 from baseline in the 3's at admission. Creatinine stable today at 4.55. Weight trending down steadily, still volume overloaded.  - Continue Lasix gtt at 12 mg/hr. I will give a dose of metolazone 5 mg x 1 today.  - No PICC due to possible need for HD down the road.  - Continue Coreg 25 mg bid.  - Continue Bidil 2 tab tid.  - No ACEI/ARNI/ARB/spironolactone with CKD stage IV.  2. Atrial flutter: Paroxysmal. Maintaining NSR.  - Continue 200 mg amiodarone daily.  - Continue apixaban.  3. AKI on CKD stage IV: Baseline creatinine 3s. Suspect cardiorenal effect with marked volume overload.   Nephrology following. Discussed fistula placement but he is not sure he wants to pursue.  4. Type II diabetes: Poor control. management per primary team.   Length of Stay: [address]  Loralie Champagne, MD  06/01/2019, 11:51 AM  Advanced Heart Failure Team Pager [phone] (M-F; 7a - 4p)  Please contact [facility] for night-coverage after hours (4p -7a ) and weekends on [email or web address]

## 2019-06-01 NOTE — Progress Notes (Signed)
PROGRESS NOTE  Joshua Massey [id number] DOB: 1975-07-31   PCP: [name], [name], MD  Patient is from: Home.  Uses cane and walker at baseline.  DOA: 05/21/2019 LOS: 11  Brief Narrative / Interim history: 43 year old male with history of DM-2, HTN, CKD-4, lower extremity diabetic wound followed by Dr. Sharol Given, paroxysmal A. fib on Eliquis, combined CHF presented with shortness of breath and edema in the setting of missed doses of diuretics and dietary indiscretion, and admitted for acute on chronic CHF.  On IV Lasix drip per advanced heart failure as well as intermittent doses of metolazone.  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  LHC with nonobstructive CAD in 2013.  Ran out of his torsemide.  Also dietary indiscretion.  CXR consistent with CHF.  BNP elevated.  Started on Lasix drip-UOP 3900 mL/in last 24 hours  Net> -26503 mL.   Renal function is stable.. -Advanced heart failure managing-continue Lasix drip and metolazone -Continue Coreg and BiDil -No ACEI/ARNI/ARB/Aldactone due to renal function -Appreciate dietitian input about dietary counseling -Counseled on the importance of compliance  Uncontrolled DM-2 with CKD-4: A1c 8.5% in 2019 and 6.8 on 05/21/2019.  Blood sugar controlled.  Continue SSI.  AKI on CKD-4/azotemia: Cr 3.6-3.7 (baseline)> 4.95 (admit)>>> 4.48> 4.36> 4.37> 4.36> 4.37> 4.62> 4.54> 4.55-likely due to cardiorenal syndrome.  Has plateaued now. -Nephrology on board, appreciate their help and management deferred to them.  Patient has declined offer for having AV fistula.  Anemia: Combination of ACD and IDA: Hgb 10-11 (baseline)> 11.5>> 11.3> 11.2> 10.5> 10.7.  Anemia panel consistent with iron deficiency.  Stable. -IV ferric gluconate on 05/25/2019. -Continue p.o. ferrous sulfate with bowel regimen  Paroxysmal A. Fib/flutter: NSR -Amiodarone and Eliquis per cardiology  Essential hypertension:  Normotensive -Cardiac meds as above  Physical deconditioning: Reportedly uses cane.  -PT/OT eval  Morbid obesity: BMI 49.41. -Encourage lifestyle change to lose weight -Could benefit from newer diabetic agents with cardiovascular benefits -Appreciate dietitian input.  Lower extremity superficial wounds: POA.  Followed by Dr. Sharol Given. -Appreciate wound care input-"patient has superficial wounds to the right posterior achilles area that measures 2 cm x 2 cm and is 100% pink and clean.  The right LE lateral aspect as two small abrasion that are also 100% pink and clean.  The left dorsal foot has a superficial wound that measures 1.6 cm x 4 cm and is 100% clean and pink.  The patient states these started from compression stockings that rolled and pleated and caused pressure to the areas.   Patient states he has worn unna boots in the past and they worked well for him.  Recruitment consultant boots bilaterally, change weekly per WOC    DVT prophylaxis: On Eliquis for A. fib Code Status: Full code Family Communication: No family present.  Patient alert and oriented.  Discussed plan of care with him and he verbalized understanding. Disposition Plan: Remains inpatient for adequate diuresis.  Discharge when cleared by cardiology. Consultants: Advanced HF and nephrology  Subjective: Seen and examined.  Denies any shortness of breath.  He tells me that cardiology has told him that they would want some more diuresis and some more weight loss and he will likely be ready for discharge on Monday or Tuesday.  Objective: Vitals:   05/31/19 0317 05/31/19 1428 05/31/19 2003 06/01/19 0300  BP: (!) 133/96 136/88 (!) 141/91 132/86  Pulse: 75 77 73 77  Resp: 16 17 20  19  Temp: 98.6 F (37 C)  97.8 F (36.6 C) 98.2 F (36.8 C)  TempSrc: Oral  Oral Oral  SpO2: 96% 96% 96% 97%  Weight: (!) 147.4 kg   (!) 144.6 kg  Height:        Intake/Output Summary (Last 24 hours) at 06/01/2019 1036 Last data filed at 06/01/2019  [id number] Gross per 24 hour  Intake 400.87 ml  Output 3275 ml  Net -2874.13 ml   Filed Weights   05/30/19 0827 05/31/19 0317 06/01/19 0300  Weight: (!) 148.9 kg (!) 147.4 kg (!) 144.6 kg    Examination:  General exam: Appears calm and comfortable, morbidly obese Respiratory system: Diminished breath sounds due to body habitus. Respiratory effort normal. Cardiovascular system: S1 & S2 heard, irregularly irregular rate and rhythm. No JVD, murmurs, rubs, gallops or clicks.  +2 pitting edema bilateral lower extremity. Gastrointestinal system: Abdomen is nondistended, soft and nontender. No organomegaly or masses felt. Normal bowel sounds heard. Central nervous system: Alert and oriented. No focal neurological deficits. Extremities: Symmetric 5 x 5 power. Skin: No rashes, lesions or ulcers.  Psychiatry: Judgement and insight appear normal. Mood & affect appropriate.    Procedures:  None   Microbiology summarized: None  Sch Meds:  Scheduled Meds: . amiodarone  200 mg Oral Daily  . apixaban  5 mg Oral BID  . atorvastatin  10 mg Oral Daily  . carvedilol  25 mg Oral BID WC  . ferrous sulfate  325 mg Oral BID  . influenza vac split quadrivalent PF  0.5 mL Intramuscular Tomorrow-1000  . insulin aspart  0-20 Units Subcutaneous TID WC  . isosorbide-hydrALAZINE  2 tablet Oral TID  . potassium chloride  10 mEq Oral Daily  . sodium chloride flush  3 mL Intravenous Q12H   Continuous Infusions: . sodium chloride    . furosemide (LASIX) infusion 12 mg/hr (05/31/19 1531)   PRN Meds:.sodium chloride, acetaminophen, gabapentin, ondansetron (ZOFRAN) IV, oxyCODONE-acetaminophen, silver sulfADIAZINE, sodium chloride flush  Antimicrobials: Anti-infectives (From admission, onward)   None       I have personally reviewed the following labs and images: CBC: Recent Labs  Lab 05/26/19 0310 05/27/19 0242 05/28/19 0256  WBC 5.8 5.3 4.8  HGB 11.2* 10.5* 10.7*  HCT 35.0* 32.6* 32.9*  MCV  94.9 93.1 92.2  PLT 182 176 183   BMP &GFR Recent Labs  Lab 05/26/19 0310 05/27/19 0242 05/28/19 0256 05/29/19 0151 05/30/19 0314 05/31/19 0305 06/01/19 0249  NA 142 141 140 140 141 140 141  K 4.1 4.0 4.0 4.0 4.2 4.2 4.4  CL 105 105 104 104 102 102 102  CO2 22 24 25 24 26 26 27   GLUCOSE 85 97 105* 105* 97 112* 112*  BUN 91* 91* 92* 97* 95* 96* 92*  CREATININE 4.36* 4.37* 4.36* 4.37* 4.62* 4.54* 4.55*  CALCIUM 8.6* 8.6* 8.5* 8.7* 8.4* 8.6* 8.6*  MG 2.3 2.3 2.1 2.1 2.2  --   --   PHOS 4.6 4.8* 4.7* 4.4 4.5 4.5 4.6   Estimated Creatinine Clearance: 30.8 mL/min (A) (by C-G formula based on SCr of 4.55 mg/dL (H)). Liver & Pancreas: Recent Labs  Lab 05/28/19 0256 05/29/19 0151 05/30/19 0314 05/31/19 0305 06/01/19 0249  ALBUMIN 2.4* 2.7* 2.8* 2.8* 2.8*   No results for input(s): LIPASE, AMYLASE in the last 168 hours. No results for input(s): AMMONIA in the last 168 hours. Diabetic: No results for input(s): HGBA1C in the last 72 hours. Recent Labs  Lab 05/31/19  [id number] 05/31/19 1104 05/31/19 1622 05/31/19 2147 06/01/19 0608  GLUCAP 95 97 121* 127* 94   Cardiac Enzymes: No results for input(s): CKTOTAL, CKMB, CKMBINDEX, TROPONINI in the last 168 hours. No results for input(s): PROBNP in the last 8760 hours. Coagulation Profile: No results for input(s): INR, PROTIME in the last 168 hours. Thyroid Function Tests: No results for input(s): TSH, T4TOTAL, FREET4, T3FREE, THYROIDAB in the last 72 hours. Lipid Profile: No results for input(s): CHOL, HDL, LDLCALC, TRIG, CHOLHDL, LDLDIRECT in the last 72 hours. Anemia Panel: No results for input(s): VITAMINB12, FOLATE, FERRITIN, TIBC, IRON, RETICCTPCT in the last 72 hours. Urine analysis:    Component Value Date/Time   COLORURINE YELLOW 05/21/2019 1724   APPEARANCEUR CLEAR 05/21/2019 1724   LABSPEC 1.012 05/21/2019 1724   PHURINE 5.0 05/21/2019 1724   GLUCOSEU NEGATIVE 05/21/2019 1724   HGBUR MODERATE (A) 05/21/2019 1724    BILIRUBINUR NEGATIVE 05/21/2019 1724   KETONESUR NEGATIVE 05/21/2019 1724   PROTEINUR 100 (A) 05/21/2019 1724   UROBILINOGEN 0.2 09/02/2009 0935   NITRITE NEGATIVE 05/21/2019 1724   LEUKOCYTESUR NEGATIVE 05/21/2019 1724   Sepsis Labs: Invalid input(s): PROCALCITONIN, [place]  Microbiology: No results found for this or any previous visit (from the past 240 hour(s)).  Radiology Studies: No results found.  Total time spent 27 minutes Darliss Cheney, MD [facility]  If 7PM-7AM, please contact night-coverage [email or web address] Password [facility] 06/01/2019, 10:36 AM

## 2019-06-01 NOTE — Progress Notes (Signed)
CARDIAC REHAB PHASE I   PRE:  Rate/Rhythm: SR 72  BP:  Supine: 138/95    MODE:  Ambulation: 470 ft   POST:  Rate/Rhythm: SR 76  BP:  Sitting: 139/84       0951-1017 Pt ambulated 470 ft pushing the IV pole with one rest break d/t to the [employer].  No c/o SOB. Pt assisted back to bed with call bell reach.   Noel Christmas, RN 06/01/2019 10:13 AM

## 2019-06-02 LAB — GLUCOSE, CAPILLARY
Glucose-Capillary: 102 mg/dL — ABNORMAL HIGH (ref 70–99)
Glucose-Capillary: 105 mg/dL — ABNORMAL HIGH (ref 70–99)
Glucose-Capillary: 140 mg/dL — ABNORMAL HIGH (ref 70–99)
Glucose-Capillary: 97 mg/dL (ref 70–99)

## 2019-06-02 LAB — RENAL FUNCTION PANEL
Albumin: 2.9 g/dL — ABNORMAL LOW (ref 3.5–5.0)
Anion gap: 13 (ref 5–15)
BUN: 95 mg/dL — ABNORMAL HIGH (ref 6–20)
CO2: 27 mmol/L (ref 22–32)
Calcium: 8.7 mg/dL — ABNORMAL LOW (ref 8.9–10.3)
Chloride: 99 mmol/L (ref 98–111)
Creatinine, Ser: 4.54 mg/dL — ABNORMAL HIGH (ref 0.61–1.24)
GFR calc Af Amer: 17 mL/min — ABNORMAL LOW (ref >60)
GFR calc non Af Amer: 15 mL/min — ABNORMAL LOW (ref >60)
Glucose, Bld: 105 mg/dL — ABNORMAL HIGH (ref 70–99)
Phosphorus: 4.2 mg/dL (ref 2.5–4.6)
Potassium: 4.2 mmol/L (ref 3.5–5.1)
Sodium: 139 mmol/L (ref 135–145)

## 2019-06-02 MED ORDER — METOLAZONE 5 MG PO TABS
5.0000 mg | ORAL_TABLET | Freq: Once | ORAL | Status: AC
  Administered 2019-06-02: 5 mg via ORAL
  Filled 2019-06-02: qty 1

## 2019-06-02 NOTE — Progress Notes (Signed)
Patient ID: Joshua Massey, male   DOB: October 15, 1975, 43 y.o.   MRN: [id number]  Weight continues to fall with stable creatinine.  Will continue Lasix gtt 12 mg/hr today and give metolazone 5 mg x 1.   Loralie Champagne 06/02/2019

## 2019-06-02 NOTE — Progress Notes (Signed)
PROGRESS NOTE  Joshua Massey [id number] DOB: [birth year]   PCP: [name], [name], MD  Patient is from: Home.  Uses cane and walker at baseline.  DOA: 05/21/2019 LOS: 12  Brief Narrative / Interim history: 43 year old male with history of DM-2, HTN, CKD-4, lower extremity diabetic wound followed by Dr. Sharol Given, paroxysmal A. fib on Eliquis, combined CHF presented with shortness of breath and edema in the setting of missed doses of diuretics and dietary indiscretion, and admitted for acute on chronic CHF.  On IV Lasix drip per advanced heart failure as well as intermittent doses of metolazone.  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  LHC with nonobstructive CAD in 2013.  Ran out of his torsemide.  Also dietary indiscretion.  CXR consistent with CHF.  BNP elevated.  Started on Lasix drip-UOP 4120 mL/in last 24 hours  Net> - 30628 mL.  Weight down from 164kg to 141 kg today. renal function is stable.. -Advanced heart failure managing-continue Lasix drip and intermittent metolazone per cardiology. -Continue Coreg and BiDil -No ACEI/ARNI/ARB/Aldactone due to renal function -Appreciate dietitian input about dietary counseling -Counseled on the importance of compliance  Uncontrolled DM-2 with CKD-4: A1c 8.5% in 2019 and 6.8 on 05/21/2019.  Blood sugar controlled.  Continue SSI.  AKI on CKD-4/azotemia: Cr 3.6-3.7 (baseline)> 4.95 (admit)>>> 4.48> 4.36> 4.37> 4.36> 4.37> 4.62> 4.54> 4.55> 4.54-likely due to cardiorenal syndrome.  Has plateaued now. -Nephrology on board, appreciate their help and management deferred to them.  Patient has declined offer for having AV fistula.  Anemia: Combination of ACD and IDA: Hgb 10-11 (baseline)> 11.5>> 11.3> 11.2> 10.5> 10.7.  Anemia panel consistent with iron deficiency.  Stable. -IV ferric gluconate on 05/25/2019. -Continue p.o. ferrous sulfate with bowel regimen  Paroxysmal A. Fib/flutter:  NSR -Amiodarone and Eliquis per cardiology  Essential hypertension: Normotensive -Cardiac meds as above  Physical deconditioning: Reportedly uses cane.  -PT/OT eval  Morbid obesity: BMI 49.41. -Encourage lifestyle change to lose weight -Could benefit from newer diabetic agents with cardiovascular benefits -Appreciate dietitian input.  Lower extremity superficial wounds: POA.  Followed by Dr. Sharol Given. -Appreciate wound care input-"patient has superficial wounds to the right posterior achilles area that measures 2 cm x 2 cm and is 100% pink and clean.  The right LE lateral aspect as two small abrasion that are also 100% pink and clean.  The left dorsal foot has a superficial wound that measures 1.6 cm x 4 cm and is 100% clean and pink.  The patient states these started from compression stockings that rolled and pleated and caused pressure to the areas.   Patient states he has worn unna boots in the past and they worked well for him.  Recruitment consultant boots bilaterally, change weekly per WOC    DVT prophylaxis: On Eliquis for A. fib Code Status: Full code Family Communication: No family present.  Patient alert and oriented.  Discussed plan of care with him and he verbalized understanding. Disposition Plan: Remains inpatient for adequate diuresis.  Discharge when cleared by cardiology. Consultants: Advanced HF and nephrology  Subjective: Seen and examined. No shortness of breath or any other complaint.  Objective: Vitals:   06/01/19 0300 06/01/19 1230 06/01/19 2012 06/02/19 0509  BP: 132/86 120/88 126/82 128/83  Pulse: 77 72 70 77  Resp: 19 19 15 19   Temp: 98.2 F (36.8 C) 98.3 F (36.8 C) 98.5 F (36.9 C) 97.9 F (36.6 C)  TempSrc:  Oral Oral Oral Oral  SpO2: 97% 96% 95% 96%  Weight: (!) 144.6 kg   (!) 141.3 kg  Height:        Intake/Output Summary (Last 24 hours) at 06/02/2019 0831 Last data filed at 06/02/2019 [id number] Gross per 24 hour  Intake --  Output 4125 ml  Net -4125 ml   Filed  Weights   05/31/19 0317 06/01/19 0300 06/02/19 0509  Weight: (!) 147.4 kg (!) 144.6 kg (!) 141.3 kg    Examination:  General exam: Appears calm and comfortable, morbidly obese Respiratory system: Diminished breath sounds due to body habitus, respiratory effort normal. Cardiovascular system: S1 & S2 heard, RRR. No JVD, murmurs, rubs, gallops or clicks. 2+ pitting edema bilateral lower extremity Gastrointestinal system: Abdomen is nondistended, soft and nontender. No organomegaly or masses felt. Normal bowel sounds heard. Central nervous system: Alert and oriented. No focal neurological deficits. Extremities: Symmetric 5 x 5 power. Skin: No rashes, lesions or ulcers.  Psychiatry: Judgement and insight appear poor. Mood & affect appropriate.   Procedures:  None   Microbiology summarized: None  Sch Meds:  Scheduled Meds: . amiodarone  200 mg Oral Daily  . apixaban  5 mg Oral BID  . atorvastatin  10 mg Oral Daily  . carvedilol  25 mg Oral BID WC  . ferrous sulfate  325 mg Oral BID  . influenza vac split quadrivalent PF  0.5 mL Intramuscular Tomorrow-1000  . insulin aspart  0-20 Units Subcutaneous TID WC  . isosorbide-hydrALAZINE  2 tablet Oral TID  . potassium chloride  10 mEq Oral Daily  . sodium chloride flush  3 mL Intravenous Q12H   Continuous Infusions: . sodium chloride    . furosemide (LASIX) infusion 12 mg/hr (06/01/19 1839)   PRN Meds:.sodium chloride, acetaminophen, gabapentin, ondansetron (ZOFRAN) IV, oxyCODONE-acetaminophen, silver sulfADIAZINE, sodium chloride flush  Antimicrobials: Anti-infectives (From admission, onward)   None       I have personally reviewed the following labs and images: CBC: Recent Labs  Lab 05/27/19 0242 05/28/19 0256  WBC 5.3 4.8  HGB 10.5* 10.7*  HCT 32.6* 32.9*  MCV 93.1 92.2  PLT 176 183   BMP &GFR Recent Labs  Lab 05/27/19 0242 05/28/19 0256 05/29/19 0151 05/30/19 0314 05/31/19 0305 06/01/19 0249 06/02/19 0237    NA 141 140 140 141 140 141 139  K 4.0 4.0 4.0 4.2 4.2 4.4 4.2  CL 105 104 104 102 102 102 99  CO2 24 25 24 26 26 27 27   GLUCOSE 97 105* 105* 97 112* 112* 105*  BUN 91* 92* 97* 95* 96* 92* 95*  CREATININE 4.37* 4.36* 4.37* 4.62* 4.54* 4.55* 4.54*  CALCIUM 8.6* 8.5* 8.7* 8.4* 8.6* 8.6* 8.7*  MG 2.3 2.1 2.1 2.2  --   --   --   PHOS 4.8* 4.7* 4.4 4.5 4.5 4.6 4.2   Estimated Creatinine Clearance: 30.5 mL/min (A) (by C-G formula based on SCr of 4.54 mg/dL (H)). Liver & Pancreas: Recent Labs  Lab 05/29/19 0151 05/30/19 0314 05/31/19 0305 06/01/19 0249 06/02/19 0237  ALBUMIN 2.7* 2.8* 2.8* 2.8* 2.9*   No results for input(s): LIPASE, AMYLASE in the last 168 hours. No results for input(s): AMMONIA in the last 168 hours. Diabetic: No results for input(s): HGBA1C in the last 72 hours. Recent Labs  Lab 06/01/19 0608 06/01/19 1128 06/01/19 1633 06/01/19 2130 06/02/19 0624  GLUCAP 94 109* 108* 118* 102*   Cardiac Enzymes: No results for input(s): CKTOTAL, CKMB, CKMBINDEX, TROPONINI in  the last 168 hours. No results for input(s): PROBNP in the last 8760 hours. Coagulation Profile: No results for input(s): INR, PROTIME in the last 168 hours. Thyroid Function Tests: No results for input(s): TSH, T4TOTAL, FREET4, T3FREE, THYROIDAB in the last 72 hours. Lipid Profile: No results for input(s): CHOL, HDL, LDLCALC, TRIG, CHOLHDL, LDLDIRECT in the last 72 hours. Anemia Panel: No results for input(s): VITAMINB12, FOLATE, FERRITIN, TIBC, IRON, RETICCTPCT in the last 72 hours. Urine analysis:    Component Value Date/Time   COLORURINE YELLOW 05/21/2019 1724   APPEARANCEUR CLEAR 05/21/2019 1724   LABSPEC 1.012 05/21/2019 1724   PHURINE 5.0 05/21/2019 1724   GLUCOSEU NEGATIVE 05/21/2019 1724   HGBUR MODERATE (A) 05/21/2019 1724   BILIRUBINUR NEGATIVE 05/21/2019 1724   KETONESUR NEGATIVE 05/21/2019 1724   PROTEINUR 100 (A) 05/21/2019 1724   UROBILINOGEN 0.2 09/02/2009 0935   NITRITE  NEGATIVE 05/21/2019 1724   LEUKOCYTESUR NEGATIVE 05/21/2019 1724   Sepsis Labs: Invalid input(s): PROCALCITONIN, [place]  Microbiology: No results found for this or any previous visit (from the past 240 hour(s)).  Radiology Studies: No results found.  Total time spent 28 minutes Darliss Cheney, MD [facility]  If 7PM-7AM, please contact night-coverage [email or web address] Password [facility] 06/02/2019, 8:31 AM

## 2019-06-02 NOTE — Progress Notes (Signed)
Patient ID: Joshua Massey, male   DOB: 10-25-1975, 43 y.o.   MRN: [id number] [facility] Progress Note   Assessment/ Plan:   1. Acute kidney Injury on chronic kidney disease stage IV:  Likely secondary to chronic cardiorenal syndrome exacerbated by recent decompensation.  Stable renal function overnight and continued net negative fluid balance with ongoing diuresis on furosemide drip/metolazone.  He declines vascular surgery evaluation at this time and again seems to not understand the gravity of the situation with false optimization from ongoing diuresis/successfully volume unloading.  I will schedule him for outpatient renal follow-up (hoping he shows up as he has missed several previously scheduled appointments in the past).  Diuretic management per cardiology. 2.  Acute exacerbation of chronic diastolic/systolic congestive heart failure: Secondary to nonadherence with medications/diet-remains volume overloaded and continues net negative fluid balance with concomitant weight loss. 3.  Atrial flutter: Adequately rate controlled on carvedilol/amiodarone, on anticoagulation with Eliquis. 4.  Hypertension: Blood pressure under acceptable control  Renal service will sign off and schedule him for outpatient follow-up in 3 to 4 weeks.  Subjective:   Reports continued improvement of his shortness of breath overnight, continues to decline vascular surgery evaluation for dialysis access.   Objective:   BP 128/83 (BP Location: Right Arm)   Pulse 77   Temp 97.9 F (36.6 C) (Oral)   Resp 19   Ht 5\' 11"  (1.803 m)   Wt (!) 141.3 kg   SpO2 96%   BMI 43.43 kg/m   Intake/Output Summary (Last 24 hours) at 06/02/2019 0855 Last data filed at 06/02/2019 [id number] Gross per 24 hour  Intake --  Output 4125 ml  Net -4125 ml   Weight change: -3.357 kg  Physical Exam: Gen: Sitting comfortably on the side of his bed CVS: Pulse regular rhythm, normal rate, S1 and S2 normal Resp: Distant  breath sounds bilaterally, no distinct rales or rhonchi Abd: Soft, obese, nontender Ext: 1+ lower extremity edema, legs wrapped  Imaging: No results found.  Labs: BMET Recent Labs  Lab 05/27/19 0242 05/28/19 0256 05/29/19 0151 05/30/19 0314 05/31/19 0305 06/01/19 0249 06/02/19 0237  NA 141 140 140 141 140 141 139  K 4.0 4.0 4.0 4.2 4.2 4.4 4.2  CL 105 104 104 102 102 102 99  CO2 24 25 24 26 26 27 27   GLUCOSE 97 105* 105* 97 112* 112* 105*  BUN 91* 92* 97* 95* 96* 92* 95*  CREATININE 4.37* 4.36* 4.37* 4.62* 4.54* 4.55* 4.54*  CALCIUM 8.6* 8.5* 8.7* 8.4* 8.6* 8.6* 8.7*  PHOS 4.8* 4.7* 4.4 4.5 4.5 4.6 4.2   CBC Recent Labs  Lab 05/27/19 0242 05/28/19 0256  WBC 5.3 4.8  HGB 10.5* 10.7*  HCT 32.6* 32.9*  MCV 93.1 92.2  PLT 176 183    Medications:    . amiodarone  200 mg Oral Daily  . apixaban  5 mg Oral BID  . atorvastatin  10 mg Oral Daily  . carvedilol  25 mg Oral BID WC  . ferrous sulfate  325 mg Oral BID  . influenza vac split quadrivalent PF  0.5 mL Intramuscular Tomorrow-1000  . insulin aspart  0-20 Units Subcutaneous TID WC  . isosorbide-hydrALAZINE  2 tablet Oral TID  . potassium chloride  10 mEq Oral Daily  . sodium chloride flush  3 mL Intravenous Q12H   Elmarie Shiley, MD 06/02/2019, 8:55 AM

## 2019-06-03 LAB — GLUCOSE, CAPILLARY
Glucose-Capillary: 99 mg/dL (ref 70–99)
Glucose-Capillary: 110 mg/dL — ABNORMAL HIGH (ref 70–99)
Glucose-Capillary: 123 mg/dL — ABNORMAL HIGH (ref 70–99)
Glucose-Capillary: 145 mg/dL — ABNORMAL HIGH (ref 70–99)

## 2019-06-03 LAB — RENAL FUNCTION PANEL
Albumin: 2.8 g/dL — ABNORMAL LOW (ref 3.5–5.0)
Anion gap: 13 (ref 5–15)
BUN: 96 mg/dL — ABNORMAL HIGH (ref 6–20)
CO2: 27 mmol/L (ref 22–32)
Calcium: 8.5 mg/dL — ABNORMAL LOW (ref 8.9–10.3)
Chloride: 100 mmol/L (ref 98–111)
Creatinine, Ser: 4.63 mg/dL — ABNORMAL HIGH (ref 0.61–1.24)
GFR calc Af Amer: 17 mL/min — ABNORMAL LOW (ref >60)
GFR calc non Af Amer: 14 mL/min — ABNORMAL LOW (ref >60)
Glucose, Bld: 109 mg/dL — ABNORMAL HIGH (ref 70–99)
Phosphorus: 4.6 mg/dL (ref 2.5–4.6)
Potassium: 4.4 mmol/L (ref 3.5–5.1)
Sodium: 140 mmol/L (ref 135–145)

## 2019-06-03 MED ORDER — METOLAZONE 5 MG PO TABS
5.0000 mg | ORAL_TABLET | Freq: Once | ORAL | Status: AC
  Administered 2019-06-03: 5 mg via ORAL
  Filled 2019-06-03: qty 1

## 2019-06-03 NOTE — Progress Notes (Signed)
CARDIAC REHAB PHASE I   PRE:  Rate/Rhythm: 71 SR    BP: sitting 129/88    SaO2: 93 RA  MODE:  Ambulation: 470 ft   POST:  Rate/Rhythm: 76 SR    BP: sitting 125/80     SaO2: 91 RA  Tolerated well. Rest x1. Pushed IV pole. Encouraged more walking. [id number]   [place], ACSM 06/03/2019 11:37 AM

## 2019-06-03 NOTE — Progress Notes (Signed)
PROGRESS NOTE  Joshua Massey [id number] DOB: [birth year]   PCP: [name], [name], MD  Patient is from: Home.  Uses cane and walker at baseline.  DOA: 05/21/2019 LOS: 100  Brief Narrative / Interim history: 43 year old male with history of DM-2, HTN, CKD-4, lower extremity diabetic wound followed by Dr. Sharol Given, paroxysmal A. fib on Eliquis, combined CHF presented with shortness of breath and edema in the setting of missed doses of diuretics and dietary indiscretion, and admitted for acute on chronic CHF.  On IV Lasix drip per advanced heart failure as well as intermittent doses of metolazone.  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  LHC with nonobstructive CAD in 2013.  Ran out of his torsemide.  Also dietary indiscretion.  CXR consistent with CHF.  BNP elevated.  Started on Lasix drip-UOP 3125 mL/in last 24 hours  Net> -32882 mL.  Weight down from 164kg to 139.3 kg today. renal function is stable.. -Advanced heart failure managing-continue Lasix drip and intermittent metolazone per cardiology. -Continue Coreg and BiDil -No ACEI/ARNI/ARB/Aldactone due to renal function -Appreciate dietitian input about dietary counseling -Counseled on the importance of compliance  Uncontrolled DM-2 with CKD-4: A1c 8.5% in 2019 and 6.8 on 05/21/2019.  Blood sugar controlled.  Continue SSI.  AKI on CKD-4/azotemia: Cr 3.6-3.7 (baseline)> 4.95 (admit)>>> 4.48> 4.36> 4.37> 4.36> 4.37> 4.62> 4.54> 4.55> 4.54-likely due to cardiorenal syndrome.  Has plateaued now. -Nephrology on board, appreciate their help and management deferred to them.  Patient has declined offer for having AV fistula.  Anemia: Combination of ACD and IDA: Hgb 10-11 (baseline)> 11.5>> 11.3> 11.2> 10.5> 10.7.  Anemia panel consistent with iron deficiency.  Stable. -IV ferric gluconate on 05/25/2019. -Continue p.o. ferrous sulfate with bowel regimen  Paroxysmal A. Fib/flutter:  NSR -Amiodarone and Eliquis per cardiology  Essential hypertension: Normotensive -Cardiac meds as above  Physical deconditioning: Reportedly uses cane.  -PT/OT eval  Morbid obesity: BMI 49.41. -Encourage lifestyle change to lose weight -Could benefit from newer diabetic agents with cardiovascular benefits -Appreciate dietitian input.  Lower extremity superficial wounds: POA.  Followed by Dr. Sharol Given. -Appreciate wound care input-"patient has superficial wounds to the right posterior achilles area that measures 2 cm x 2 cm and is 100% pink and clean.  The right LE lateral aspect as two small abrasion that are also 100% pink and clean.  The left dorsal foot has a superficial wound that measures 1.6 cm x 4 cm and is 100% clean and pink.  The patient states these started from compression stockings that rolled and pleated and caused pressure to the areas.   Patient states he has worn unna boots in the past and they worked well for him.  Recruitment consultant boots bilaterally, change weekly per WOC    DVT prophylaxis: On Eliquis for A. fib Code Status: Full code Family Communication: No family present.  Patient alert and oriented.  Discussed plan of care with him and he verbalized understanding. Disposition Plan: Remains inpatient for adequate diuresis.  Discharge when cleared by cardiology. Consultants: Advanced HF and nephrology  Subjective: Seen and examined.  No shortness of breath.  No other complaint.  Asking when he can go home.  Objective: Vitals:   06/02/19 1135 06/02/19 1720 06/03/19 0432 06/03/19 0830  BP: 120/83 (!) 133/99 121/83 136/87  Pulse: 70  79 76  Resp: 15  16 16   Temp: 98.1 F (36.7 C)  98.6 F (37 C) 98.4  F (36.9 C)  TempSrc: Oral  Oral Oral  SpO2: 95%  96% 95%  Weight:   (!) 139.3 kg   Height:        Intake/Output Summary (Last 24 hours) at 06/03/2019 1017 Last data filed at 06/03/2019 0945 Gross per 24 hour  Intake 1330.7 ml  Output 3600 ml  Net -2269.3 ml   Filed  Weights   06/01/19 0300 06/02/19 0509 06/03/19 0432  Weight: (!) 144.6 kg (!) 141.3 kg (!) 139.3 kg    Examination:  General exam: Appears calm and comfortable  Respiratory system: Clear to auscultation. Respiratory effort normal. Cardiovascular system: S1 & S2 heard, RRR. No JVD, murmurs, rubs, gallops or clicks.  +2 pitting edema bilateral lower extremity Gastrointestinal system: Abdomen is nondistended, soft and nontender. No organomegaly or masses felt. Normal bowel sounds heard. Central nervous system: Alert and oriented. No focal neurological deficits. Extremities: Symmetric 5 x 5 power. Skin: No rashes, lesions or ulcers.  Psychiatry: Judgement and insight appear normal. Mood & affect appropriate.   Procedures:  None   Microbiology summarized: None  Sch Meds:  Scheduled Meds: . amiodarone  200 mg Oral Daily  . apixaban  5 mg Oral BID  . atorvastatin  10 mg Oral Daily  . carvedilol  25 mg Oral BID WC  . ferrous sulfate  325 mg Oral BID  . influenza vac split quadrivalent PF  0.5 mL Intramuscular Tomorrow-1000  . insulin aspart  0-20 Units Subcutaneous TID WC  . isosorbide-hydrALAZINE  2 tablet Oral TID  . potassium chloride  10 mEq Oral Daily  . sodium chloride flush  3 mL Intravenous Q12H   Continuous Infusions: . sodium chloride    . furosemide (LASIX) infusion 12 mg/hr (06/02/19 1542)   PRN Meds:.sodium chloride, acetaminophen, gabapentin, ondansetron (ZOFRAN) IV, oxyCODONE-acetaminophen, silver sulfADIAZINE, sodium chloride flush  Antimicrobials: Anti-infectives (From admission, onward)   None       I have personally reviewed the following labs and images: CBC: Recent Labs  Lab 05/28/19 0256  WBC 4.8  HGB 10.7*  HCT 32.9*  MCV 92.2  PLT 183   BMP &GFR Recent Labs  Lab 05/28/19 0256 05/29/19 0151 05/30/19 0314 05/31/19 0305 06/01/19 0249 06/02/19 0237 06/03/19 0251  NA 140 140 141 140 141 139 140  K 4.0 4.0 4.2 4.2 4.4 4.2 4.4  CL 104  104 102 102 102 99 100  CO2 25 24 26 26 27 27 27   GLUCOSE 105* 105* 97 112* 112* 105* 109*  BUN 92* 97* 95* 96* 92* 95* 96*  CREATININE 4.36* 4.37* 4.62* 4.54* 4.55* 4.54* 4.63*  CALCIUM 8.5* 8.7* 8.4* 8.6* 8.6* 8.7* 8.5*  MG 2.1 2.1 2.2  --   --   --   --   PHOS 4.7* 4.4 4.5 4.5 4.6 4.2 4.6   Estimated Creatinine Clearance: 29.7 mL/min (A) (by C-G formula based on SCr of 4.63 mg/dL (H)). Liver & Pancreas: Recent Labs  Lab 05/30/19 0314 05/31/19 0305 06/01/19 0249 06/02/19 0237 06/03/19 0251  ALBUMIN 2.8* 2.8* 2.8* 2.9* 2.8*   No results for input(s): LIPASE, AMYLASE in the last 168 hours. No results for input(s): AMMONIA in the last 168 hours. Diabetic: No results for input(s): HGBA1C in the last 72 hours. Recent Labs  Lab 06/02/19 0624 06/02/19 1134 06/02/19 1630 06/02/19 2125 06/03/19 0634  GLUCAP 102* 105* 140* 97 99   Cardiac Enzymes: No results for input(s): CKTOTAL, CKMB, CKMBINDEX, TROPONINI in the last 168 hours. No results  for input(s): PROBNP in the last 8760 hours. Coagulation Profile: No results for input(s): INR, PROTIME in the last 168 hours. Thyroid Function Tests: No results for input(s): TSH, T4TOTAL, FREET4, T3FREE, THYROIDAB in the last 72 hours. Lipid Profile: No results for input(s): CHOL, HDL, LDLCALC, TRIG, CHOLHDL, LDLDIRECT in the last 72 hours. Anemia Panel: No results for input(s): VITAMINB12, FOLATE, FERRITIN, TIBC, IRON, RETICCTPCT in the last 72 hours. Urine analysis:    Component Value Date/Time   COLORURINE YELLOW 05/21/2019 1724   APPEARANCEUR CLEAR 05/21/2019 1724   LABSPEC 1.012 05/21/2019 1724   PHURINE 5.0 05/21/2019 1724   GLUCOSEU NEGATIVE 05/21/2019 1724   HGBUR MODERATE (A) 05/21/2019 1724   BILIRUBINUR NEGATIVE 05/21/2019 1724   KETONESUR NEGATIVE 05/21/2019 1724   PROTEINUR 100 (A) 05/21/2019 1724   UROBILINOGEN 0.2 09/02/2009 0935   NITRITE NEGATIVE 05/21/2019 1724   LEUKOCYTESUR NEGATIVE 05/21/2019 1724   Sepsis  Labs: Invalid input(s): PROCALCITONIN, [place]  Microbiology: No results found for this or any previous visit (from the past 240 hour(s)).  Radiology Studies: No results found.  Total time spent 26 minutes Darliss Cheney, MD [facility]  If 7PM-7AM, please contact night-coverage [email or web address] Password [email or web address] 06/03/2019, 10:17 AM

## 2019-06-03 NOTE — Progress Notes (Signed)
Physical Therapy Treatment Patient Details Name: Joshua Massey MRN: [id number] DOB: 1975-07-08 Today's Date: 06/03/2019    History of Present Illness Pt is a 43 year old male with history of DM-2, HTN, CKD-4, lower extremity diabetic wound followed by Dr. Sharol Given, paroxysmal A. fib on Eliquis, combined CHF presented with shortness of breath and edema in the setting of doses of diuretics and dietary indiscretion, and admitted for acute on chronic CHF.     PT Comments    Patient has make good progress toward PT goals. Pt tolerated gait distance of 500 ft with SPC. VSS on RA and pt denies SOB with mobility. Continue to progress as tolerated.    Follow Up Recommendations  No PT follow up     Equipment Recommendations  None recommended by PT(pt has SPC at home)    Recommendations for Other Services       Precautions / Restrictions Precautions Precautions: Fall Restrictions Weight Bearing Restrictions: No    Mobility  Bed Mobility Overal bed mobility: Independent                Transfers Overall transfer level: Independent Equipment used: None Transfers: Sit to/from Stand              Ambulation/Gait Ambulation/Gait assistance: Supervision Gait Distance (Feet): 500 Feet Assistive device: Straight cane Gait Pattern/deviations: Step-through pattern;Wide base of support;Decreased stride length Gait velocity: decreased   General Gait Details: improved cadence and left drifting R/L compared to previous session; supervision for safety; no LOB; one brief standing rest break   Stairs             Wheelchair Mobility    Modified Rankin (Stroke Patients Only)       Balance Overall balance assessment: Needs assistance Sitting-balance support: Feet supported Sitting balance-Leahy Scale: Good     Standing balance support: No upper extremity supported;During functional activity Standing balance-Leahy Scale: Fair                               Cognition Arousal/Alertness: Awake/alert Behavior During Therapy: WFL for tasks assessed/performed Overall Cognitive Status: Within Functional Limits for tasks assessed                                        Exercises      General Comments        Pertinent Vitals/Pain Pain Assessment: No/denies pain    Home Living                      Prior Function            PT Goals (current goals can now be found in the care plan section) Progress towards PT goals: Progressing toward goals    Frequency    Min 3X/week      PT Plan Discharge plan needs to be updated;Equipment recommendations need to be updated    Co-evaluation              AM-PAC PT "6 Clicks" Mobility   Outcome Measure  Help needed turning from your back to your side while in a flat bed without using bedrails?: None Help needed moving from lying on your back to sitting on the side of a flat bed without using bedrails?: None Help needed moving to and from a bed to a chair (including a  wheelchair)?: None Help needed standing up from a chair using your arms (e.g., wheelchair or bedside chair)?: None Help needed to walk in hospital room?: A Little Help needed climbing 3-5 steps with a railing? : A Little 6 Click Score: 22    End of Session   Activity Tolerance: Patient tolerated treatment well Patient left: with call bell/phone within reach(pt sitting EOB ) Nurse Communication: Mobility status PT Visit Diagnosis: Unsteadiness on feet (R26.81);Muscle weakness (generalized) (M62.81);Difficulty in walking, not elsewhere classified (R26.2)     Time: 1350-1411 PT Time Calculation (min) (ACUTE ONLY): 21 min  Charges:  $Gait Training: 8-22 mins                     Earney Navy, PTA Acute Rehabilitation Services Pager: [phone] Office: [phone]     Darliss Cheney 06/03/2019, 4:06 PM

## 2019-06-03 NOTE — Progress Notes (Addendum)
Patient ID: Joshua Massey, male   DOB: Jan 26, 1976, 43 y.o.   MRN: [id number]     Advanced Heart Failure Rounding Note  PCP-Cardiologist: Dr. Aundra Dubin   Subjective:    Yesterday diuresed with lasix drip 12 mg/hr + metolazone 5 mg x 1. Weight down another 4 pounds. Overall weight down 55 pounds.      Feeling better. Denies SOB.    Objective:   Weight Range: (!) 139.3 kg Body mass index is 42.83 kg/m.   Vital Signs:   Temp:  [98.1 F (36.7 C)-98.6 F (37 C)] 98.6 F (37 C) (12/14 0432) Pulse Rate:  [70-79] 79 (12/14 0432) Resp:  [15-16] 16 (12/14 0432) BP: (116-133)/(83-99) 121/83 (12/14 0432) SpO2:  [95 %-96 %] 96 % (12/14 0432) Weight:  [139.3 kg] 139.3 kg (12/14 0432) Last BM Date: 06/03/19  Weight change: Filed Weights   06/01/19 0300 06/02/19 0509 06/03/19 0432  Weight: (!) 144.6 kg (!) 141.3 kg (!) 139.3 kg    Intake/Output:   Intake/Output Summary (Last 24 hours) at 06/03/2019 0809 Last data filed at 06/03/2019 0431 Gross per 24 hour  Intake 1430.7 ml  Output 3125 ml  Net -1694.3 ml      Physical Exam    General:  Sitting on the side of the bed.  No resp difficulty HEENT: normal Neck: supple. JVP to jaw. . Carotids 2+ bilat; no bruits. No lymphadenopathy or thryomegaly appreciated. Cor: PMI nondisplaced. Regular rate & rhythm. No rubs, gallops or murmurs. Lungs: clear Abdomen: soft, nontender, nondistended. No hepatosplenomegaly. No bruits or masses. Good bowel sounds. Extremities: no cyanosis, clubbing, rash, R and LLE thighs 1+ edema Neuro: alert & orientedx3, cranial nerves grossly intact. moves all 4 extremities w/o difficulty. Affect pleasant   Telemetry  NSR 60-70s personally  reviewed  EKG    N/a   Labs    CBC No results for input(s): WBC, NEUTROABS, HGB, HCT, MCV, PLT in the last 72 hours. Basic Metabolic Panel Recent Labs    06/02/19 0237 06/03/19 0251  NA 139 140  K 4.2 4.4  CL 99 100  CO2 27 27  GLUCOSE 105* 109*  BUN 95*  96*  CREATININE 4.54* 4.63*  CALCIUM 8.7* 8.5*  PHOS 4.2 4.6   Liver Function Tests Recent Labs    06/02/19 0237 06/03/19 0251  ALBUMIN 2.9* 2.8*   No results for input(s): LIPASE, AMYLASE in the last 72 hours. Cardiac Enzymes No results for input(s): CKTOTAL, CKMB, CKMBINDEX, TROPONINI in the last 72 hours.  BNP: BNP (last 3 results) Recent Labs    05/25/19 0235 05/26/19 0310 05/27/19 0242  BNP 1,682.3* 1,444.0* 1,403.3*    ProBNP (last 3 results) No results for input(s): PROBNP in the last 8760 hours.   D-Dimer No results for input(s): DDIMER in the last 72 hours. Hemoglobin A1C No results for input(s): HGBA1C in the last 72 hours. Fasting Lipid Panel No results for input(s): CHOL, HDL, LDLCALC, TRIG, CHOLHDL, LDLDIRECT in the last 72 hours. Thyroid Function Tests No results for input(s): TSH, T4TOTAL, T3FREE, THYROIDAB in the last 72 hours.  Invalid input(s): FREET3  Other results:   Imaging    No results found.   Medications:     Scheduled Medications: . amiodarone  200 mg Oral Daily  . apixaban  5 mg Oral BID  . atorvastatin  10 mg Oral Daily  . carvedilol  25 mg Oral BID WC  . ferrous sulfate  325 mg Oral BID  . influenza vac split  quadrivalent PF  0.5 mL Intramuscular Tomorrow-1000  . insulin aspart  0-20 Units Subcutaneous TID WC  . isosorbide-hydrALAZINE  2 tablet Oral TID  . potassium chloride  10 mEq Oral Daily  . sodium chloride flush  3 mL Intravenous Q12H    Infusions: . sodium chloride    . furosemide (LASIX) infusion 12 mg/hr (06/02/19 1542)    PRN Medications: sodium chloride, acetaminophen, gabapentin, ondansetron (ZOFRAN) IV, oxyCODONE-acetaminophen, silver sulfADIAZINE, sodium chloride flush     Assessment/Plan   1. Acute on chronic systolic CHF: Echo in 0000000 showed EF 20-25% with mildly dilated LV, normal RV.  Suspected nonischemic cardiomyopathy, has had low EF known since 2013, at that time had cath with  nonobstructive CAD.  He ran out of his meds at home (torsemide in particular) and had significant dietary indiscretion around Thanksgiving.  Creatinine elevated at 4.95 from baseline in the 3's at admission. Creatinine stable today at 4.6. Weight trending down steadily, still volume overloaded. Overall weight is down 55 pounds.  - Continue Lasix gtt at 12 mg/hr +  metolazone 5 mg x 1 today.  Anticipate transitioning to torsemide 100 mg twice a day.  - No PICC due to possible need for HD down the road.  - Continue Coreg 25 mg bid.  - Continue Bidil 2 tab tid.  - No ACEI/ARNI/ARB/spironolactone with CKD stage IV.  2. Atrial flutter: Paroxysmal. Maintaining NSR.  - Continue 200 mg amiodarone daily.  - Continue apixaban.  3. AKI on CKD stage IV: Baseline creatinine 3s. Suspect cardiorenal effect with marked volume overload.   Nephrology following. Discussed fistula placement. He is agreeable. .  4. Type II diabetes: Poor control. management per primary team.   Length of Stay: [place], NP  06/03/2019, 8:09 AM  Advanced Heart Failure Team Pager [phone] (M-F; 7a - 4p)  Please contact [facility] for night-coverage after hours (4p -7a ) and weekends on [email or web address]  Patient seen with NP, agree with the above note.    He continues to diurese well, creatinine remaining stable.  Weight now down > 55 lbs.    Still volume overload on exam with elevated JVP.   Continue Lasix gtt with metolazone 5 mg x 1 today.   Loralie Champagne 06/03/2019 9:01 AM

## 2019-06-03 NOTE — Discharge Instructions (Signed)
Low Sodium Nutrition Therapy  Eating less sodium can help you if you have high blood pressure, heart failure, or kidney or liver disease.   Your body needs a little sodium, but too much sodium can cause your body to hold onto extra water. This extra water will raise your blood pressure and can cause damage to your heart, kidneys, or liver as they are forced to work harder.   Sometimes you can see how the extra fluid affects you because your hands, legs, or belly swell. You may also hold water around your heart and lungs, which makes it hard to breathe.   Even if you take medication for blood pressure or a water pill (diuretic) to remove fluid, it is still important to have less salt in your diet.   Check with your primary care provider before drinking alcohol since it may affect the amount of fluid in your body and how your heart, kidneys, or liver work. Sodium in Food A low-sodium meal plan limits the sodium that you get from food and beverages to 1,500-2,000 milligrams (mg) per day. Salt is the main source of sodium. Read the nutrition label on the package to find out how much sodium is in one serving of a food.  . Select foods with 140 milligrams (mg) of sodium or less per serving.  . You may be able to eat one or two servings of foods with a little more than 140 milligrams (mg) of sodium if you are closely watching how much sodium you eat in a day.  . Check the serving size on the label. The amount of sodium listed on the label shows the amount in one serving of the food. So, if you eat more than one serving, you will get more sodium than the amount listed.  Tips Cutting Back on Sodium . Eat more fresh foods.  . Fresh fruits and vegetables are low in sodium, as well as frozen vegetables and fruits that have no added juices or sauces.  . Fresh meats are lower in sodium than processed meats, such as bacon, sausage, and hotdogs.  . Not all processed foods are unhealthy, but some processed foods  may have too much sodium.  . Eat less salt at the table and when cooking. One of the ingredients in salt is sodium.  . One teaspoon of table salt has 2,300 milligrams of sodium.  . Leave the salt out of recipes for pasta, casseroles, and soups. . Be a smart shopper.  . Food packages that say "Salt-free", sodium-free", "very low sodium," and "low sodium" have less than 140 milligrams of sodium per serving.  . Beware of products identified as "Unsalted," "No Salt Added," "Reduced Sodium," or "Lower Sodium." These items may still be high in sodium. You should always check the nutrition label. . Add flavors to your food without adding sodium.  . Try lemon juice, lime juice, or vinegar.  . Dry or fresh herbs add flavor.  . Buy a sodium-free seasoning blend or make your own at home. . You can purchase salt-free or sodium-free condiments like barbeque sauce in stores and online. Ask your registered dietitian nutritionist for recommendations and where to find them.  .  Eating in Restaurants . Choose foods carefully when you eat outside your home. Restaurant foods can be very high in sodium. Many restaurants provide nutrition facts on their menus or their websites. If you cannot find that information, ask your server. Let your server know that you want your food   to be cooked without salt and that you would like your salad dressing and sauces to be served on the side.  .   . Foods Recommended . Food Group . Foods Recommended  . Grains . Bread, bagels, rolls without salted tops Homemade bread made with reduced-sodium baking powder Cold cereals, especially shredded wheat and puffed rice Oats, grits, or cream of wheat Pastas, quinoa, and rice Popcorn, pretzels or crackers without salt Corn tortillas  . Protein Foods . Fresh meats and fish; turkey bacon (check the nutrition labels - make sure they are not packaged in a sodium solution) Canned or packed tuna (no more than 4 ounces at 1 serving) Beans  and peas Soybeans) and tofu Eggs Nuts or nut butters without salt  . Dairy . Milk or milk powder Plant milks, such as rice and soy Yogurt, including Greek yogurt Small amounts of natural cheese (blocks of cheese) or reduced-sodium cheese can be used in moderation. (Swiss, ricotta, and fresh mozzarella cheese are lower in sodium than the others) Cream Cheese Low sodium cottage cheese  . Vegetables . Fresh and frozen vegetables without added sauces or salt Homemade soups (without salt) Low-sodium, salt-free or sodium-free canned vegetables and soups  . Fruit . Fresh and canned fruits Dried fruits, such as raisins, cranberries, and prunes  . Oils . Tub or liquid margarine, regular or without salt Canola, corn, peanut, olive, safflower, or sunflower oils  . Condiments . Fresh or dried herbs such as basil, bay leaf, dill, mustard (dry), nutmeg, paprika, parsley, rosemary, sage, or thyme.  Low sodium ketchup Vinegar  Lemon or lime juice Pepper, red pepper flakes, and cayenne. Hot sauce contains sodium, but if you use just a drop or two, it will not add up to much.  Salt-free or sodium-free seasoning mixes and marinades Simple salad dressings: vinegar and oil  .  . Foods Not Recommended . Food Group . Foods Not Recommended  . Grains . Breads or crackers topped with salt Cereals (hot/cold) with more than 300 mg sodium per serving Biscuits, cornbread, and other "quick" breads prepared with baking soda Pre-packaged bread crumbs Seasoned and packaged rice and pasta mixes Self-rising flours  . Protein Foods . Cured meats: Bacon, ham, sausage, pepperoni and hot dogs Canned meats (chili, vienna sausage, or sardines) Smoked fish and meats Frozen meals that have more than 600 mg of sodium per serving Egg substitute (with added sodium)  . Dairy . Buttermilk Processed cheese spreads Cottage cheese (1 cup may have over 500 mg of sodium; look for low-sodium.) American or feta cheese Shredded  Cheese has more sodium than blocks of cheese String cheese  . Vegetables . Canned vegetables (unless they are salt-free, sodium-free or low sodium) Frozen vegetables with seasoning and sauces Sauerkraut and pickled vegetables Canned or dried soups (unless they are salt-free, sodium-free, or low sodium) French fries and onion rings  . Fruit . Dried fruits preserved with additives that have sodium  . Oils . Salted butter or margarine, all types of olives  . Condiments . Salt, sea salt, kosher salt, onion salt, and garlic salt Seasoning mixes with salt Bouillon cubes Ketchup Barbeque sauce and Worcestershire sauce unless low sodium Soy sauce Salsa, pickles, olives, relish Salad dressings: ranch, blue cheese, Italian, and French.  .  . Low Sodium Sample 1-Day Menu  . Breakfast . 1 cup cooked oatmeal  . 1 slice whole wheat bread toast  . 1 tablespoon peanut butter without salt  . 1 banana  .   1 cup 1% milk  . Lunch . Tacos made with: 2 corn tortillas  .  cup black beans, low sodium  .  cup roasted or grilled chicken (without skin)  .  avocado  . Squeeze of lime juice  . 1 cup salad greens  . 1 tablespoon low-sodium salad dressing  .  cup strawberries  . 1 orange  . Afternoon Snack . 1/3 cup grapes  . 6 ounces yogurt  . Evening Meal . 3 ounces herb-baked fish  . 1 baked potato  . 2 teaspoons olive oil  .  cup cooked carrots  . 2 thick slices tomatoes on:  . 2 lettuce leaves  . 1 teaspoon olive oil  . 1 teaspoon balsamic vinegar  . 1 cup 1% milk  . Evening Snack . 1 apple  .  cup almonds without salt  .  . Low-Sodium Vegetarian (Lacto-Ovo) Sample 1-Day Menu  . Breakfast . 1 cup cooked oatmeal  . 1 slice whole wheat toast  . 1 tablespoon peanut butter without salt  . 1 banana  . 1 cup 1% milk  . Lunch . Tacos made with: 2 corn tortillas  .  cup black beans, low sodium  .  cup roasted or grilled chicken (without skin)  .  avocado  . Squeeze of lime juice  . 1  cup salad greens  . 1 tablespoon low-sodium salad dressing  .  cup strawberries  . 1 orange  . Evening Meal . Stir fry made with:  cup tofu  . 1 cup brown rice  .  cup broccoli  .  cup green beans  .  cup peppers  .  tablespoon peanut oil  . 1 orange  . 1 cup 1% milk  . Evening Snack . 4 strips celery  . 2 tablespoons hummus  . 1 hard-boiled egg  .  . Low-Sodium Vegan Sample 1-Day Menu  . Breakfast . 1 cup cooked oatmeal  . 1 tablespoon peanut butter without salt  . 1 cup blueberries  . 1 cup soymilk fortified with calcium, vitamin B12, and vitamin D  . Lunch . 1 small whole wheat pita  .  cup cooked lentils  . 2 tablespoons hummus  . 4 carrot sticks  . 1 medium apple  . 1 cup soymilk fortified with calcium, vitamin B12, and vitamin D  . Evening Meal . Stir fry made with:  cup tofu  . 1 cup brown rice  .  cup broccoli  .  cup green beans  .  cup peppers  .  tablespoon peanut oil  . 1 cup cantaloupe  . Evening Snack . 1 cup soy yogurt  .  cup mixed nuts  . Copyright 2020  [employer]y [employer]. All rights reserved .  . Sodium Free Flavoring Tips .  . When cooking, the following items may be used for flavoring instead of salt or seasonings that contain sodium. . Remember: A little bit of spice goes a long way! Be careful not to overseason. . Spice Blend Recipe (makes about ? cup) . 5 teaspoons onion powder  . 2 teaspoons garlic powder  . 2 teaspoons paprika  . 2 teaspoon dry mustard  . 1 teaspoon crushed thyme leaves  .  teaspoon white pepper  .  teaspoon celery seed Food Item Flavorings  Beef Basil, bay leaf, caraway, curry, dill, dry mustard, garlic, grape jelly, green pepper, mace, marjoram, mushrooms (fresh), nutmeg, onion or onion powder, parsley, pepper,   rosemary, sage  Chicken Basil, cloves, cranberries, mace, mushrooms (fresh), nutmeg, oregano, paprika, parsley, pineapple, saffron, sage, savory, tarragon, thyme, tomato,  turmeric  Egg Chervil, curry, dill, dry mustard, garlic or garlic powder, green pepper, jelly, mushrooms (fresh), nutmeg, onion powder, paprika, parsley, rosemary, tarragon, tomato  Fish Basil, bay leaf, chervil, curry, dill, dry mustard, green pepper, lemon juice, marjoram, mushrooms (fresh), paprika, pepper, tarragon, tomato, turmeric  Lamb Cloves, curry, dill, garlic or garlic powder, mace, mint, mint jelly, onion, oregano, parsley, pineapple, rosemary, tarragon, thyme  Pork Applesauce, basil, caraway, chives, cloves, garlic or garlic powder, onion or onion powder, rosemary, thyme  Veal Apricots, basil, bay leaf, currant jelly, curry, ginger, marjoram, mushrooms (fresh), oregano, paprika  Vegetables Basil, dill, garlic or garlic powder, ginger, lemon juice, mace, marjoram, nutmeg, onion or onion powder, tarragon, tomato, sugar or sugar substitute, salt-free salad dressing, vinegar  Desserts Allspice, anise, cinnamon, cloves, ginger, mace, nutmeg, vanilla extract, other extracts   Copyright 2020  Academy [employer]. All rights reserved  Fluid Restricted Nutrition Therapy  You have been prescribed this diet because your condition affects how much fluid you can eat or drink. If your heart, liver, or kidneys aren't working properly, you may not be able to effectively eliminate fluids from the body and this may cause swelling (edema) in the legs, arms, and/or stomach. Drink no more than _________ liters or ________ ounces or ________cups of fluid per day.  . You don't need to stop eating or drinking the same fluids you normally would, but you may need to eat or drink less than usual.  . Your registered dietitian nutritionist will help you determine the correct amount of fluid to consume during the day Breakfast Include fluids taken with medications  Lunch Include fluids taken with medications  Dinner Include fluids taken with medications  Bedtime Snack Include fluids taken with  medications     Tips What Are Fluids?  A fluid is anything that is liquid or anything that would melt if left at room temperature. You will need to count these foods and liquids--including any liquid used to take medication--as part of your daily fluid intake. Some examples are: . Alcohol (drink only with your doctor's permission)  . Coffee, tea, and other hot beverages  . Gelatin (Jell-O)  . Gravy  . Ice cream, sherbet, sorbet  . Ice cubes, ice chips  . Milk, liquid creamer  . Nutritional supplements  . Popsicles  . Vegetable and fruit juices; fluid in canned fruit  . Watermelon  . Yogurt  . Soft drinks, lemonade, limeade  . Soups  . Syrup How Do I Measure My Fluid Intake? . Record your fluid intake daily.  . Tip: Every day, each time you eat or drink fluids, pour water in the same amount into an empty container that can hold the same amount of fluids you are allowed daily. This may help you keep track of how much fluid you are taking in throughout the day.  . To accurately keep track of how much liquid you take in, measure the size of the cups, glasses, and bowls you use. If you eat soup, measure how much of it is liquid and how much is solid (such as noodles, vegetables, meat). Conversions for Measuring Fluid Intake  Milliliters (mL) Liters (L) Ounces (oz) Cups (c)  1000 1 32 4  1200 1.2 40 5  1500 1.5 50 6 1/4  1800 1.8 60 7 1/2  2000 2 67 8 1/3    Tips to Reduce Your Thirst . Chew gum or suck on hard candy.  . Rinse or gargle with mouthwash. Do not swallow.  . Ice chips or popsicles my help quench thirst, but this too needs to be calculated into the total restriction. Melt ice chips or cubes first to figure out how much fluid they produce (for example, experiment with melting  cup ice chips or 2 ice cubes).  . Add a lemon wedge to your water.  . Limit how much salt you take in. A high salt intake might make you thirstier.  . Don't eat or drink all your allowed liquids at  once. Space your liquids out through the day.  . Use small glasses and cups and sip slowly. If allowed, take your medications with fluids you eat or drink during a meal.   Fluid-Restricted Nutrition Therapy Sample 1-Day Menu  Breakfast 1 slice wheat toast  1 tablespoon peanut butter  1/2 cup yogurt (120 milliliters)  1/2 cup blueberries  1 cup milk (240 milliliters)   Lunch 3 ounces sliced turkey  2 slices whole wheat bread  1/2 cup lettuce for sandwich  2 slices tomato for sandwich  1 ounce reduced-fat, reduced-sodium cheese  1/2 cup fresh carrot sticks  1 banana  1 cup unsweetened tea (240 milliliters)   Evening Meal 8 ounces soup (240 milliliters)  3 ounces salmon  1/2 cup quinoa  1 cup green beans  1 cup mixed greens salad  1 tablespoon olive oil  1 cup coffee (240 milliliters)  Evening Snack 1/2 cup sliced peaches  1/2 cup frozen yogurt (120 milliliters)  1 cup water (240 milliliters)  Copyright 2020  Academy [employer]. All rights reserved  ---------------------------------------------------------------- Information on my medicine - ELIQUIS (apixaban)  Why was Eliquis prescribed for you? Eliquis was prescribed for you to reduce the risk of a blood clot forming that can cause a stroke if you have a medical condition called atrial fibrillation (a type of irregular heartbeat).  What do You need to know about Eliquis ? Take your Eliquis TWICE DAILY - one tablet in the morning and one tablet in the evening with or without food. If you have difficulty swallowing the tablet whole please discuss with your pharmacist how to take the medication safely.  Take Eliquis exactly as prescribed by your doctor and DO NOT stop taking Eliquis without talking to the doctor who prescribed the medication.  Stopping may increase your risk of developing a stroke.  Refill your prescription before you run out.  After discharge, you should have regular check-up appointments  with your healthcare provider that is prescribing your Eliquis.  In the future your dose may need to be changed if your kidney function or weight changes by a significant amount or as you get older.  What do you do if you miss a dose? If you miss a dose, take it as soon as you remember on the same day and resume taking twice daily.  Do not take more than one dose of ELIQUIS at the same time to make up a missed dose.  Important Safety Information A possible side effect of Eliquis is bleeding. You should call your healthcare provider right away if you experience any of the following: ? Bleeding from an injury or your nose that does not stop. ? Unusual colored urine (red or dark brown) or unusual colored stools (red or black). ? Unusual bruising for unknown reasons. ? A serious fall or if you hit   your head (even if there is no bleeding).  Some medicines may interact with Eliquis and might increase your risk of bleeding or clotting while on Eliquis. To help avoid this, consult your healthcare provider or pharmacist prior to using any new prescription or non-prescription medications, including herbals, vitamins, non-steroidal anti-inflammatory drugs (NSAIDs) and supplements.  This website has more information on Eliquis (apixaban): http://[email or web address].com/eliquis/home  

## 2019-06-04 LAB — RENAL FUNCTION PANEL
Albumin: 2.9 g/dL — ABNORMAL LOW (ref 3.5–5.0)
Anion gap: 11 (ref 5–15)
BUN: 96 mg/dL — ABNORMAL HIGH (ref 6–20)
CO2: 29 mmol/L (ref 22–32)
Calcium: 8.9 mg/dL (ref 8.9–10.3)
Chloride: 101 mmol/L (ref 98–111)
Creatinine, Ser: 4.9 mg/dL — ABNORMAL HIGH (ref 0.61–1.24)
GFR calc Af Amer: 16 mL/min — ABNORMAL LOW (ref >60)
GFR calc non Af Amer: 14 mL/min — ABNORMAL LOW (ref >60)
Glucose, Bld: 119 mg/dL — ABNORMAL HIGH (ref 70–99)
Phosphorus: 4.6 mg/dL (ref 2.5–4.6)
Potassium: 4.1 mmol/L (ref 3.5–5.1)
Sodium: 141 mmol/L (ref 135–145)

## 2019-06-04 LAB — GLUCOSE, CAPILLARY: Glucose-Capillary: 100 mg/dL — ABNORMAL HIGH (ref 70–99)

## 2019-06-04 MED ORDER — AMIODARONE HCL 200 MG PO TABS: 200.0000 mg | ORAL_TABLET | Freq: Every day | ORAL | 0 refills | Status: AC

## 2019-06-04 MED ORDER — TORSEMIDE 20 MG PO TABS: 100.0000 mg | ORAL_TABLET | Freq: Every day | ORAL | Status: DC

## 2019-06-04 MED ORDER — TORSEMIDE 20 MG PO TABS
100.0000 mg | ORAL_TABLET | Freq: Two times a day (BID) | ORAL | Status: DC
  Filled 2019-06-04: qty 5

## 2019-06-04 MED ORDER — TORSEMIDE 100 MG PO TABS: 100.0000 mg | ORAL_TABLET | Freq: Every day | ORAL | 0 refills | Status: DC

## 2019-06-04 NOTE — Progress Notes (Signed)
CARDIAC REHAB PHASE I   HF education completed with pt. Pt educated on importance of weighing daily and keeping a log. Stressed importance of keeping appointments and taking medications as prescribed. Encouraged continued ambulation, water restrictions, and low sodium diets. Talked with pt about his kidney function, and what the future may look like. Pt states he knows people who are on dialysis and would like to avoid it if possible. Pt states he does not have a scale at home. Will refer to CRP II [place]. Pt is interested in participating in Virtual Cardiac and Pulmonary Rehab. Pt advised that Virtual Cardiac and Pulmonary Rehab is provided at no cost to the patient.  Checklist:  1. Pt has smart device  ie smartphone and/or ipad for downloading an app  Yes 2. Reliable internet/wifi service    Yes 3. Understands how to use their smartphone and navigate within an app.  Yes  Pt verbalized understanding and is in agreement.  [id number] Rufina Falco, RN BSN 06/04/2019 11:55 AM

## 2019-06-04 NOTE — Discharge Summary (Signed)
Physician Discharge Summary  CHELSEY KIMBERLEY [id number] DOB: Jun 14, 1976 DOA: 05/21/2019  PCP: [name], [name], MD  Admit date: 05/21/2019 Discharge date: 06/04/2019  Admitted From: Home Disposition: Home  Recommendations for Outpatient Follow-up:  1. Follow up with PCP in 1-2 weeks 2. Follow with cardiology/heart failure team (when they schedule you) 3. Follow with nephrology 4. Please obtain BMP/CBC in one week 5. Please follow up on the following pending results:  Home Health: None Equipment/Devices: None  Discharge Condition: Stable CODE STATUS: Full code Diet recommendation: Low-sodium  Subjective: Seen and examined. No complaints or any shortness of breath. Wants to go home. Tells me that he is cleared by cardiology.  Brief/Interim Summary: 43 year old male with history of DM-2, HTN, CKD-4, lower extremity diabetic wound followed by Dr. Sharol Given, paroxysmal A. fib on Eliquis, combined CHF presented with shortness of breath and edema in the setting of missed doses of diuretics and dietary indiscretion, and admitted for acute on chronic CHF.   He was started on IV Lasix drip per advanced heart failure as well as intermittent doses of metolazone and this was continued for several days and was stopped only at 8 AM this morning. This was managed by cardiology/heart failure team. To date, he is net -34 348 mL and his weight is down all the way from 164 kg to 136 kg. He was continued on Coreg and BiDil. He also came in with AKI on CKD stage IV. His baseline creatinine was 3.6 but he came in with 4.95. This has been stable/plateaued around 4.5 with good urine output. Nephrology was consulted and was managing this. He was offered to have AV fistula here however he declined that. Nephrology signed off and recommended follow-up with them as outpatient. He has history of missing a lot of appointments with them already. His A. fib remained controlled. Finally he has been cleared by cardiology to go  home. He was taking torsemide 20 mg three times a day and currently cardiology has recommended to discharge him home on torsemide 100 mg p.o. daily. His electrolytes are stable. He is being discharged in stable condition.  Discharge Diagnoses:  Active Problems:   Hypertension   Obesity   Acute on chronic systolic CHF (congestive heart failure) (HCC)   Type 2 diabetes mellitus with vascular disease (HCC)   CKD (chronic kidney disease), stage IV (HCC)   Acute on chronic systolic heart failure (HCC)   Acute exacerbation of CHF (congestive heart failure) [facility])   Physical deconditioning    Discharge Instructions  Discharge Instructions    Discharge patient   Complete by: As directed    Discharge disposition: 01-Home or Self Care   Discharge patient date: 06/04/2019     Allergies as of 06/04/2019   No Known Allergies     Medication List    TAKE these medications   amiodarone 200 MG tablet Commonly known as: PACERONE Take 1 tablet (200 mg total) by mouth daily. What changed: when to take this   apixaban 5 MG Tabs tablet Commonly known as: ELIQUIS Take 1 tablet (5 mg total) by mouth 2 (two) times daily.   atorvastatin 10 MG tablet Commonly known as: LIPITOR Take 1 tablet (10 mg total) by mouth daily with supper. What changed: when to take this   carvedilol 25 MG tablet Commonly known as: COREG Take 1 tablet (25 mg total) by mouth 2 (two) times daily with a meal.   freestyle lancets Use as instructed   FreeStyle Libre 14 Day  Reader Kerrin Mo 1 Device by Does not apply route daily.   FreeStyle Libre 14 Day Sensor Misc Inject 1 Device into the skin 4 (four) times daily as needed.   gabapentin 300 MG capsule Commonly known as: NEURONTIN Take 1 capsule (300 mg total) by mouth at bedtime. What changed:   when to take this  reasons to take this   glucose blood test strip Commonly known as: FREESTYLE TEST STRIPS Use as instructed   glucose monitoring kit monitoring  kit 1 each by Does not apply route 4 (four) times daily - after meals and at bedtime. 1 month Diabetic Testing Supplies for QAC-QHS accuchecks.   insulin aspart 100 UNIT/ML FlexPen Commonly known as: NovoLOG FlexPen Inject 0-15 Units into the skin 3 (three) times daily with meals. 0-15U/Subcu/TIDw/meals//CBG70-120:0U/CBG121-150:2U/CBG151-200:3U/CBG201-250:5U/CBG251-300:8U/CBG301-350:11U/CBG351-400:15U//CBG>400:Call MD   Insulin Glargine 100 UNIT/ML Solostar Pen Commonly known as: LANTUS Inject 5 Units into the skin at bedtime. What changed: how much to take   Insulin Pen Needle 32G X 8 MM Misc Use as directed   Iron 325 (65 Fe) MG Tabs TAKE 1 TABLET BY MOUTH TWICE DAILY WITH MEALS . APPOINTMENT REQUIRED FOR FUTURE REFILLS What changed: See the new instructions.   isosorbide-hydrALAZINE 20-37.5 MG tablet Commonly known as: BiDil Take 1 tablet by mouth 2 (two) times daily.   multivitamin with minerals Tabs tablet Take 1 tablet by mouth daily.   oxyCODONE-acetaminophen 5-325 MG tablet Commonly known as: PERCOCET/ROXICET Take 1 tablet by mouth every 8 (eight) hours as needed for moderate pain or severe pain.   silver sulfADIAZINE 1 % cream Commonly known as: SILVADENE Apply topically daily. Silvadene plus dry dressing to plantar ulcer left foot.  Change daily. What changed:   how much to take  when to take this  reasons to take this   torsemide 100 MG tablet Commonly known as: DEMADEX Take 1 tablet (100 mg total) by mouth daily. Start taking on: June 05, 2019 What changed:   how much to take  when to take this  Another medication with the same name was removed. Continue taking this medication, and follow the directions you see here.   Vitamin D (Ergocalciferol) 1.25 MG (50000 UT) Caps capsule Commonly known as: DRISDOL Take 1 capsule (50,000 Units total) by mouth every 7 (seven) days.            Durable Medical Equipment  (From admission, onward)          Start     Ordered   05/30/19 0943  For home use only DME 3 n 1  Once    Comments: bariatric   05/30/19 0942   05/28/19 2039  For home use only DME Walker rolling  Once    Comments: Bariatric with 5 inch wheels.  Question:  Patient needs a walker to treat with the following condition  Answer:  Physical deconditioning   05/28/19 2039         Follow-up Information    [facility].   Specialty: [place] Why: HHRN/PT arranged- they will call you to start home visits after discharge       [facility] Follow up on 06/11/2019.   Specialty: Cardiology Why: 10:30 Garage Code [id number]  Contact information: [address] [id number] [address]       [name], [name], MD Follow up in 1 week(s).   Specialty: Family Medicine Contact information: [place]  [place] [address] [phone]        [place], [place]. Schedule an appointment as soon as possible for a visit in 2 week(s).   Contact information: [place] [address] [phone]          No Known Allergies  Consultations: Nephrology and cardiology   Procedures/Studies: DG Chest 2 View  Result Date: 05/22/2019 CLINICAL DATA:  Congestive heart failure. EXAM: CHEST - 2 VIEW COMPARISON:  05/21/2019 FINDINGS: Very low lung volumes limit evaluation. Lordotic positioning also noted. Cardiomegaly remains stable. Both lungs are grossly clear. IMPRESSION: Very low lung volumes limit evaluation. Stable cardiomegaly. No active lung disease. Electronically Signed   By: Marlaine Hind M.D.   On: 05/22/2019 08:55   DG Chest 2 View  Result Date: 05/21/2019 CLINICAL DATA:  43 year old male with history of increasing edema and weight gain. History of congestive heart failure. EXAM: CHEST - 2 VIEW COMPARISON:  Chest x-ray 08/29/2018. FINDINGS: Lung volumes are low. No consolidative  airspace disease. No pleural effusions. Cephalization of the pulmonary vasculature with mild indistinctness of interstitial markings and moderate to severe cardiomegaly. Upper mediastinal contours are within normal limits. IMPRESSION: 1. The appearance the chest suggests mild congestive heart failure. Electronically Signed   By: Vinnie Langton M.D.   On: 05/21/2019 18:09   VAS [place] UPPER EXT VEIN MAPPING (PRE-OP AVF)  Result Date: 05/30/2019 UPPER EXTREMITY VEIN MAPPING  Indications: Pre-access. Performing Technologist: June Leap RDMS, RVT  Examination Guidelines: A complete evaluation includes B-mode imaging, spectral Doppler, color Doppler, and power Doppler as needed of all accessible portions of each vessel. Bilateral testing is considered an integral part of a complete examination. Limited examinations for reoccurring indications may be performed as noted. +-----------------+-------------+----------+---------+ Right Cephalic   Diameter (cm)Depth (cm)Findings  +-----------------+-------------+----------+---------+ Shoulder             0.34        0.29             +-----------------+-------------+----------+---------+ Mid upper arm        0.36        0.37   branching +-----------------+-------------+----------+---------+ Dist upper arm       0.37        0.38             +-----------------+-------------+----------+---------+ Antecubital fossa    0.45        0.21             +-----------------+-------------+----------+---------+ Prox forearm         0.57        0.41   branching +-----------------+-------------+----------+---------+ Mid forearm          0.37        0.36             +-----------------+-------------+----------+---------+ Dist forearm         0.39        0.29             +-----------------+-------------+----------+---------+ +-----------------+-------------+----------+---------+ Right Basilic    Diameter (cm)Depth (cm)Findings   +-----------------+-------------+----------+---------+ Shoulder             0.40        0.35             +-----------------+-------------+----------+---------+ Mid upper arm        0.43        0.35   branching +-----------------+-------------+----------+---------+ Dist upper arm       0.50        0.23             +-----------------+-------------+----------+---------+  Antecubital fossa    0.47                IV site  +-----------------+-------------+----------+---------+ Prox forearm         0.45        0.27             +-----------------+-------------+----------+---------+ Mid forearm          0.41        0.26             +-----------------+-------------+----------+---------+ Distal forearm       0.35        0.24             +-----------------+-------------+----------+---------+ *See table(s) above for measurements and observations.  Diagnosing physician: Monica Martinez MD Electronically signed by Monica Martinez MD on 05/30/2019 at 6:41:15 PM.    Final       Discharge Exam: Vitals:   06/04/19 0605 06/04/19 0825  BP: 114/80 (!) 143/93  Pulse: 78 78  Resp: 18 16  Temp: 98.1 F (36.7 C) 98.4 F (36.9 C)  SpO2: 97% 97%   Vitals:   06/03/19 1616 06/03/19 2145 06/04/19 0605 06/04/19 0825  BP: (!) 135/92 134/83 114/80 (!) 143/93  Pulse: 74 70 78 78  Resp: _0 Temp: 97.9 F (36.6 C) 97.8 F (36.6 C) 98.1 F (36.7 C) 98.4 F (36.9 C)  TempSrc: Oral Oral Oral Oral  SpO2: 95% 97% 97% 97%  Weight:   (!) 136.4 kg   Height:        General: Pt is alert, awake, not in acute distress Cardiovascular: RRR, S1/S2 +, no rubs, no gallops Respiratory: CTA bilaterally, no wheezing, no rhonchi Abdominal: Soft, NT, ND, bowel sounds + Extremities: +1 pitting edema bilateral lower extremity, no cyanosis    The results of significant diagnostics from this hospitalization (including imaging, microbiology, ancillary and laboratory) are listed below for  reference.     Microbiology: No results found for this or any previous visit (from the past 240 hour(s)).   Labs: BNP (last 3 results) Recent Labs    05/25/19 0235 05/26/19 0310 05/27/19 0242  BNP 1,682.3* 1,444.0* 1,610.9*   Basic Metabolic Panel: Recent Labs  Lab 05/29/19 0151 05/30/19 0314 05/31/19 0305 06/01/19 0249 06/02/19 0237 06/03/19 0251 06/04/19 0307  NA 140 141 140 141 139 140 141  K 4.0 4.2 4.2 4.4 4.2 4.4 4.1  CL 104 102 102 102 99 100 101  CO2 _1 GLUCOSE 105* 97 112* 112* 105* 109* 119*  BUN 97* 95* 96* 92* 95* 96* 96*  CREATININE 4.37* 4.62* 4.54* 4.55* 4.54* 4.63* 4.90*  CALCIUM 8.7* 8.4* 8.6* 8.6* 8.7* 8.5* 8.9  MG 2.1 2.2  --   --   --   --   --   PHOS 4.4 4.5 4.5 4.6 4.2 4.6 4.6   Liver Function Tests: Recent Labs  Lab 05/31/19 0305 06/01/19 0249 06/02/19 0237 06/03/19 0251 06/04/19 0307  ALBUMIN 2.8* 2.8* 2.9* 2.8* 2.9*   No results for input(s): LIPASE, AMYLASE in the last 168 hours. No results for input(s): AMMONIA in the last 168 hours. CBC: No results for input(s): WBC, NEUTROABS, HGB, HCT, MCV, PLT in the last 168 hours. Cardiac Enzymes: No results for input(s): CKTOTAL, CKMB, CKMBINDEX, TROPONINI in the last 168 hours. BNP: Invalid input(s): POCBNP CBG: Recent Labs  Lab 06/03/19 0634 06/03/19 1110 06/03/19 1619 06/03/19 2142 06/04/19 0603  GLUCAP 99  110* 123* 145* 100*   D-Dimer No results for input(s): DDIMER in the last 72 hours. Hgb A1c No results for input(s): HGBA1C in the last 72 hours. Lipid Profile No results for input(s): CHOL, HDL, LDLCALC, TRIG, CHOLHDL, LDLDIRECT in the last 72 hours. Thyroid function studies No results for input(s): TSH, T4TOTAL, T3FREE, THYROIDAB in the last 72 hours.  Invalid input(s): FREET3 Anemia work up No results for input(s): VITAMINB12, FOLATE, FERRITIN, TIBC, IRON, RETICCTPCT in the last 72 hours. Urinalysis    Component Value Date/Time   COLORURINE  YELLOW 05/21/2019 1724   APPEARANCEUR CLEAR 05/21/2019 1724   LABSPEC 1.012 05/21/2019 1724   PHURINE 5.0 05/21/2019 1724   GLUCOSEU NEGATIVE 05/21/2019 1724   HGBUR MODERATE (A) 05/21/2019 1724   BILIRUBINUR NEGATIVE 05/21/2019 1724   KETONESUR NEGATIVE 05/21/2019 1724   PROTEINUR 100 (A) 05/21/2019 1724   UROBILINOGEN 0.2 09/02/2009 0935   NITRITE NEGATIVE 05/21/2019 1724   LEUKOCYTESUR NEGATIVE 05/21/2019 1724   Sepsis Labs Invalid input(s): PROCALCITONIN,  WBC,  LACTICIDVEN Microbiology No results found for this or any previous visit (from the past 240 hour(s)).   Time coordinating discharge: Over 30 minutes  SIGNED:   Darliss Cheney, MD  [facility] 06/04/2019, 10:12 AM  If 7PM-7AM, please contact night-coverage [email or web address] Password [email or web address]

## 2019-06-04 NOTE — TOC Transition Note (Signed)
Transition of Care [facility]) - CM/SW Discharge Note  Marvetta Gibbons RN, BSN Transitions of Care Unit 4E- RN Case Manager [phone]  Patient Details  Name: Joshua Massey MRN: [id number] Date of Birth: Jul 04, 1975  Transition of Care [facility]) CM/SW Contact:  Dawayne Patricia, RN Phone Number: 06/04/2019, 12:52 PM   Clinical Narrative:    Pt stable for transition home today, off Lasix drip, have notified Zach with [employer] for DME needs- RW and 3n1 to be delivered to room prior to discharge. Notified Butch Penny with [facility] for [facility] on [facility] referral.  Pt to f/u with HF clinic.    Final next level of care: [place] Barriers to Discharge: Barriers Resolved   Patient Goals and CMS Choice Patient states their goals for this hospitalization and ongoing recovery are:: return home and try to stay out of hospital CMS Medicare.gov Compare Post Acute Care list provided to:: Patient Choice offered to / list presented to : Patient  Discharge Placement               Home with [facility]        Discharge Plan and Services   Discharge Planning Services: CM Consult Post Acute Care Choice: Home Health, Durable Medical Equipment          DME Arranged: 3-N-1, Walker rolling DME Agency: [employer] Date DME Agency Contacted: 06/04/19 Time DME Agency Contacted: 1000 Representative spoke with at DME Agency: Thedore Mins HH Arranged: RN, PT [place] Agency: [place] ([place]) Date [place]: 05/29/19 Time [place]: 1400 Representative spoke with at [place]: [place] ([place]) Interventions     Readmission Risk Interventions No flowsheet data found.

## 2019-06-04 NOTE — Progress Notes (Signed)
Discharge instructions given to patient. IV removed, clean and intact. Medications reviewed. All questions answered. Patient escorted home by father.   Arletta Bale, RN

## 2019-06-04 NOTE — Progress Notes (Addendum)
Patient ID: Joshua Massey, male   DOB: 29-Apr-1976, 43 y.o.   MRN: [id number]     Advanced Heart Failure Rounding Note  PCP-Cardiologist: Dr. Aundra Dubin   Subjective:    Yesterday diuresed with lasix drip 12 mg/hr + metolazone 5 mg x 1. Weight down another 7 pounds. Overall weight down 62pounds.      Creatinine trending up 4.6>4.9. BUN unchanged 96>95.   Wants to go home. Denies SOB.    Objective:   Weight Range: (!) 136.4 kg Body mass index is 41.93 kg/m.   Vital Signs:   Temp:  [97.8 F (36.6 C)-98.4 F (36.9 C)] 98.1 F (36.7 C) (12/15 0605) Pulse Rate:  [70-78] 78 (12/15 0605) Resp:  [16-18] 18 (12/15 0605) BP: (114-136)/(80-92) 114/80 (12/15 0605) SpO2:  [95 %-97 %] 97 % (12/15 0605) Weight:  [136.4 kg] 136.4 kg (12/15 0605) Last BM Date: 06/03/19  Weight change: Filed Weights   06/02/19 0509 06/03/19 0432 06/04/19 0605  Weight: (!) 141.3 kg (!) 139.3 kg (!) 136.4 kg    Intake/Output:   Intake/Output Summary (Last 24 hours) at 06/04/2019 0803 Last data filed at 06/04/2019 0200 Gross per 24 hour  Intake 1179.13 ml  Output 3205 ml  Net -2025.87 ml      Physical Exam   General:  Well appearing. No resp difficulty HEENT: normal Neck: supple. JVP 8-9  Carotids 2+ bilat; no bruits. No lymphadenopathy or thryomegaly appreciated. Cor: PMI nondisplaced. Regular rate & rhythm. No rubs, gallops or murmurs. Lungs: clear Abdomen: soft, nontender, nondistended. No hepatosplenomegaly. No bruits or masses. Good bowel sounds. Extremities: no cyanosis, clubbing, rash, R and LLE 1+ edema Neuro: alert & orientedx3, cranial nerves grossly intact. moves all 4 extremities w/o difficulty. Affect pleasant   Telemetry  NSR 60-70s personally  reviewed  EKG    N/a   Labs    CBC No results for input(s): WBC, NEUTROABS, HGB, HCT, MCV, PLT in the last 72 hours. Basic Metabolic Panel Recent Labs    06/03/19 0251 06/04/19 0307  NA 140 141  K 4.4 4.1  CL 100 101  CO2  27 29  GLUCOSE 109* 119*  BUN 96* 96*  CREATININE 4.63* 4.90*  CALCIUM 8.5* 8.9  PHOS 4.6 4.6   Liver Function Tests Recent Labs    06/03/19 0251 06/04/19 0307  ALBUMIN 2.8* 2.9*   No results for input(s): LIPASE, AMYLASE in the last 72 hours. Cardiac Enzymes No results for input(s): CKTOTAL, CKMB, CKMBINDEX, TROPONINI in the last 72 hours.  BNP: BNP (last 3 results) Recent Labs    05/25/19 0235 05/26/19 0310 05/27/19 0242  BNP 1,682.3* 1,444.0* 1,403.3*    ProBNP (last 3 results) No results for input(s): PROBNP in the last 8760 hours.   D-Dimer No results for input(s): DDIMER in the last 72 hours. Hemoglobin A1C No results for input(s): HGBA1C in the last 72 hours. Fasting Lipid Panel No results for input(s): CHOL, HDL, LDLCALC, TRIG, CHOLHDL, LDLDIRECT in the last 72 hours. Thyroid Function Tests No results for input(s): TSH, T4TOTAL, T3FREE, THYROIDAB in the last 72 hours.  Invalid input(s): FREET3  Other results:   Imaging    No results found.   Medications:     Scheduled Medications:  amiodarone  200 mg Oral Daily   apixaban  5 mg Oral BID   atorvastatin  10 mg Oral Daily   carvedilol  25 mg Oral BID WC   ferrous sulfate  325 mg Oral BID   influenza vac  split quadrivalent PF  0.5 mL Intramuscular Tomorrow-1000   insulin aspart  0-20 Units Subcutaneous TID WC   isosorbide-hydrALAZINE  2 tablet Oral TID   potassium chloride  10 mEq Oral Daily   sodium chloride flush  3 mL Intravenous Q12H    Infusions:  sodium chloride     furosemide (LASIX) infusion 12 mg/hr (06/03/19 2145)    PRN Medications: sodium chloride, acetaminophen, gabapentin, ondansetron (ZOFRAN) IV, oxyCODONE-acetaminophen, silver sulfADIAZINE, sodium chloride flush     Assessment/Plan   1. Acute on chronic systolic CHF: Echo in 0000000 showed EF 20-25% with mildly dilated LV, normal RV.  Suspected nonischemic cardiomyopathy, has had low EF known since 2013, at that time  had cath with nonobstructive CAD.  He ran out of his meds at home (torsemide in particular) and had significant dietary indiscretion around Thanksgiving.  Creatinine elevated at 4.95 from baseline in the 3's at admission. Creatinine stable today at 4.6. Weight trending down steadily, still volume overloaded. Overall weight is down 62 pounds.  Creatinine trending up . Stop lasix drip.  - Transition to  torsemide 100 mg daily. At home he was taking torsemide 20 mg three times  A day.   - No PICC due to possible need for HD down the road.  - Continue Coreg 25 mg bid.  - Continue Bidil 2 tab tid.  - No ACEI/ARNI/ARB/spironolactone with CKD stage IV.  2. Atrial flutter: Paroxysmal. Maintaining NSR.  - Continue 200 mg amiodarone daily.  - Continue apixaban.  3. AKI on CKD stage IV: Baseline creatinine 3s. Suspect cardiorenal effect with marked volume overload.  Creatinine trending up.  Nephrology following. Discussed fistula placement. He is agreeable. .  4. Type II diabetes: Poor control. management per primary team.   HF Follow up will be set up. We discussed the importance of medication compliance.   We will see him next week. Ok for discharge per Dr Aundra Dubin.  Length of Stay: [place], NP  06/04/2019, 8:03 AM  Advanced Heart Failure Team Pager [phone] (M-F; 7a - 4p)  Please contact [facility] for night-coverage after hours (4p -7a ) and weekends on [email or web address]  Patient seen with NP, agree with the above note.   He is down 62 lbs but still probably with some volume overload.  He is insistent on going home today, so will transition to torsemide 100 mg daily and he can be discharged with close followup in CHF clinic.  Continue Coreg and Bidil.   Loralie Champagne 06/04/2019 12:47 PM

## 2019-06-05 ENCOUNTER — Other Ambulatory Visit (HOSPITAL_COMMUNITY): Payer: Self-pay

## 2019-06-05 NOTE — Progress Notes (Signed)
Telephone encounter I called Joshua Massey to schedule a CHP visit. I left a message because there was no answer.

## 2019-06-06 ENCOUNTER — Telehealth (HOSPITAL_COMMUNITY): Payer: Self-pay | Admitting: *Deleted

## 2019-06-06 NOTE — Telephone Encounter (Signed)
Pt left VM requesting a scale. Scale left at front desk for patient. Called both numbers in his chart to inform him scale was ready for pick up no answer/no vm.

## 2019-06-10 ENCOUNTER — Telehealth: Payer: Self-pay | Admitting: *Deleted

## 2019-06-10 NOTE — Telephone Encounter (Signed)
Margie from [facility] states her patient has wounds on his lower extremities and she needs wound care orders

## 2019-06-10 NOTE — Telephone Encounter (Signed)
Very poor authorization can be given to requested service. He was last seen in 10/2018, he has not been compliant with follow-ups. Please arrange appointment. Thanks, BJ

## 2019-06-11 ENCOUNTER — Other Ambulatory Visit (HOSPITAL_COMMUNITY): Payer: Self-pay

## 2019-06-11 ENCOUNTER — Encounter (HOSPITAL_COMMUNITY): Payer: BC Managed Care – PPO

## 2019-06-11 NOTE — Telephone Encounter (Signed)
I called and spoke with [facility]. She is aware of the message below. She will let pt know to call the office to schedule an appt with pcp.

## 2019-06-11 NOTE — Progress Notes (Signed)
I Arrived to [name] for initial [facility] visit.   He hasn't returned my calls or replied to any messages.  I will try to reach out to him next week.

## 2019-06-12 ENCOUNTER — Telehealth (HOSPITAL_COMMUNITY): Payer: Self-pay

## 2019-06-12 NOTE — Telephone Encounter (Signed)
Verbal given to [facility] .

## 2019-06-12 NOTE — Telephone Encounter (Signed)
It is okay to give verbal authorization for wound care plan as requested. Thanks, BJ

## 2019-06-12 NOTE — Telephone Encounter (Signed)
Margie called in with some wound care orders.   Pt has 2 open posterior ankle wounds and another ankle wound. They are wanting to use normal saline, medahoney, foam dressing, & change the dressing twice a week.  He has an open calus that Dr. Sharol Given has been taking care of, so they are wanting to know if they can use betadine to clean it and have him follow up with Dr. Sharol Given.  He is aware to make a follow up appt with you.

## 2019-06-12 NOTE — Telephone Encounter (Signed)
Pt insurance is active and benefits verified through [employer]. Co-pay $0.00, DED $6,000.00/$6,000.00 met, out of pocket $6,650.00/$6,650.00 met, co-insurance 25%. No pre-authorization required. Passport, 06/12/2019 @ 8:59AM, REF#[id number]  Will contact patient to see if he is interested in the Cardiac Rehab Program. If interested, patient will need to complete follow up appt. Once completed, patient will be contacted for scheduling upon review by the RN Navigator.

## 2019-06-12 NOTE — Telephone Encounter (Signed)
Called patient to see if he is interested in the Cardiac Rehab Program. Patient expressed interest. Explained scheduling process and went over insurance, patient verbalized understanding. Will contact patient for scheduling once f/u has been completed.  °

## 2019-06-17 ENCOUNTER — Other Ambulatory Visit: Payer: Self-pay | Admitting: Family Medicine

## 2019-06-19 ENCOUNTER — Telehealth: Payer: Self-pay | Admitting: Family Medicine

## 2019-06-19 NOTE — Telephone Encounter (Signed)
[place] with [place] calling about pt wound dressing as supplies ordered or not in as of yet. Wants to see if Dr [name] would be in agreement with doing a wet to dry dressing with teaching until supplies arrive. Pls FU asap as she is going out to visit pt FU with verbal at [phone]

## 2019-06-19 NOTE — Telephone Encounter (Signed)
Message Routed to PCP CMA 

## 2019-06-19 NOTE — Telephone Encounter (Signed)
Per Dr. [name] - verbal is okay. Called and spoke with [place], verbal given.

## 2019-06-20 ENCOUNTER — Telehealth (HOSPITAL_COMMUNITY): Payer: Self-pay

## 2019-06-20 NOTE — Telephone Encounter (Signed)
Samples given:  Bidil 4 bottles  Lot #: [id number] Exp: 01/22  Pt requesting information on assistance. Routing to PharmD

## 2019-06-24 ENCOUNTER — Telehealth (HOSPITAL_COMMUNITY): Payer: Self-pay | Admitting: Pharmacist

## 2019-06-24 NOTE — Telephone Encounter (Signed)
Received message that patient needs assistance affording Bidil. Upon further review of patient's profile, it appears he has Pharmacist, community with [employer] and should be eligible for a copay card, which would decrease his copay to $25. It also appears he talked to a Education officer, museum from the heart failure clinic in early December, who helped him sign up for the copay card and gave him a copy of the information. I have reached out to him to determine if he was able to use the copay card or not. Left VM.  Audry Riles, PharmD, BCPS, BCCP, CPP [facility] Pharmacist [phone]

## 2019-07-01 ENCOUNTER — Encounter (HOSPITAL_COMMUNITY): Payer: BC Managed Care – PPO

## 2019-07-12 ENCOUNTER — Other Ambulatory Visit: Payer: Self-pay | Admitting: Family Medicine

## 2019-07-24 ENCOUNTER — Other Ambulatory Visit: Payer: Self-pay | Admitting: Family Medicine

## 2019-07-24 ENCOUNTER — Other Ambulatory Visit (HOSPITAL_COMMUNITY): Payer: Self-pay

## 2019-07-24 MED ORDER — BIDIL 20-37.5 MG PO TABS: 1.0000 | ORAL_TABLET | Freq: Two times a day (BID) | ORAL | 0 refills | Status: AC

## 2019-07-24 MED ORDER — TORSEMIDE 100 MG PO TABS: 100.0000 mg | ORAL_TABLET | Freq: Every day | ORAL | 2 refills | Status: AC

## 2019-07-24 NOTE — Telephone Encounter (Signed)
Medication Samples have been provided to the patient.  Drug name: Bidil        Strength: 20-37.5mg         Qty: 3 bottles  LOT: [id number]  Exp.Date: 06/2020  Dosing instructions: Take 1 tablet by mouth twice daily  The patient has been instructed regarding the correct time, dose, and frequency of taking this medication, including desired effects and most common side effects.   Teka Chanda R Ardath Lepak [id number] PM 07/24/2019

## 2019-07-30 ENCOUNTER — Telehealth (HOSPITAL_COMMUNITY): Payer: Self-pay

## 2019-07-30 NOTE — Telephone Encounter (Signed)
Spoke to Edge who agreed to me attending clinic visit with him on Thursday 2/11 at 1100. Will be filling pill boxes and assessing patient needs.

## 2019-08-01 ENCOUNTER — Encounter (HOSPITAL_COMMUNITY): Payer: BC Managed Care – PPO

## 2019-08-01 ENCOUNTER — Telehealth (HOSPITAL_COMMUNITY): Payer: Self-pay | Admitting: Licensed Clinical Social Worker

## 2019-08-01 ENCOUNTER — Telehealth (HOSPITAL_COMMUNITY): Payer: Self-pay

## 2019-08-01 NOTE — Telephone Encounter (Signed)
CSW called pt to discuss reason for no show to appt today and see if he would like to reschedule.  Pt has not been easy to contact by community paramedic and if he continues to not return calls or show up to appts will need to discharge from community paramedicine program.  Will attempt contact again next week to discuss.  Jorge Ny, LCSW Clinical Social Worker [facility] Desk#: [phone] Cell#: [phone]

## 2019-08-01 NOTE — Telephone Encounter (Signed)
Left message to remind patient of his appointment in the clinic today and that I would be meeting him there. I also reminded him to bring all medications. Message complete. Will continue to follow.

## 2019-08-28 ENCOUNTER — Telehealth (HOSPITAL_COMMUNITY): Payer: Self-pay | Admitting: Cardiology

## 2019-08-28 NOTE — Telephone Encounter (Signed)
Patient left voicemail with a request for medication samples of bidil

## 2019-08-29 NOTE — Telephone Encounter (Signed)
Returned call to patient  chf follow up scheduled 4/1 @2  Samples left for patient in the front office

## 2019-08-30 NOTE — Telephone Encounter (Signed)
Medication Samples have been provided to the patient.  Drug name: Bidil       Strength: 20-37.5mg         Qty: 1  LOT: [id number]  Exp.Date: 01/22  Dosing instructions: take 1 tab po qd  The patient has been instructed regarding the correct time, dose, and frequency of taking this medication, including desired effects and most common side effects.   Lawrie Tunks R Aveleen Nevers 6:16 AM 08/30/2019

## 2019-09-03 ENCOUNTER — Telehealth (HOSPITAL_COMMUNITY): Payer: Self-pay

## 2019-09-03 NOTE — Telephone Encounter (Signed)
Left message for Joshua Massey to reach out to me regarding paramedicine program and his interest in being followed. Will continue to follow and keep on list until spoken to.

## 2019-09-11 ENCOUNTER — Other Ambulatory Visit: Payer: Self-pay | Admitting: Family Medicine

## 2019-09-12 ENCOUNTER — Telehealth (HOSPITAL_COMMUNITY): Payer: Self-pay | Admitting: Licensed Clinical Social Worker

## 2019-09-12 NOTE — Telephone Encounter (Signed)
Pt continues to not return calls of community paramedic so no initial visit has been completed.  Patient being removed from [facility] program at this time- can reevalute if pt reaches out and is interested in the program.  Joshua Massey, [place] Worker [facility] Desk#: [phone] Cell#: [phone]

## 2019-09-19 ENCOUNTER — Encounter (HOSPITAL_COMMUNITY): Payer: Self-pay

## 2019-09-19 ENCOUNTER — Encounter (HOSPITAL_COMMUNITY): Payer: BC Managed Care – PPO

## 2019-10-14 ENCOUNTER — Other Ambulatory Visit: Payer: Self-pay

## 2019-10-14 ENCOUNTER — Encounter (HOSPITAL_COMMUNITY): Payer: Self-pay

## 2019-10-14 ENCOUNTER — Inpatient Hospital Stay (HOSPITAL_COMMUNITY)
Admission: EM | Admit: 2019-10-14 | Discharge: 2019-11-19 | DRG: 291 | Disposition: E | Payer: BC Managed Care – PPO | Attending: Emergency Medicine | Admitting: Emergency Medicine

## 2019-10-14 DIAGNOSIS — E1122 Type 2 diabetes mellitus with diabetic chronic kidney disease: Secondary | ICD-10-CM | POA: Diagnosis present

## 2019-10-14 DIAGNOSIS — N185 Chronic kidney disease, stage 5: Secondary | ICD-10-CM | POA: Diagnosis present

## 2019-10-14 DIAGNOSIS — Z978 Presence of other specified devices: Secondary | ICD-10-CM

## 2019-10-14 DIAGNOSIS — Z452 Encounter for adjustment and management of vascular access device: Secondary | ICD-10-CM

## 2019-10-14 DIAGNOSIS — Z7901 Long term (current) use of anticoagulants: Secondary | ICD-10-CM

## 2019-10-14 DIAGNOSIS — I4891 Unspecified atrial fibrillation: Secondary | ICD-10-CM | POA: Diagnosis not present

## 2019-10-14 DIAGNOSIS — D509 Iron deficiency anemia, unspecified: Secondary | ICD-10-CM | POA: Diagnosis present

## 2019-10-14 DIAGNOSIS — E46 Unspecified protein-calorie malnutrition: Secondary | ICD-10-CM | POA: Diagnosis present

## 2019-10-14 DIAGNOSIS — R680 Hypothermia, not associated with low environmental temperature: Secondary | ICD-10-CM | POA: Diagnosis not present

## 2019-10-14 DIAGNOSIS — I5043 Acute on chronic combined systolic (congestive) and diastolic (congestive) heart failure: Secondary | ICD-10-CM | POA: Diagnosis present

## 2019-10-14 DIAGNOSIS — R188 Other ascites: Secondary | ICD-10-CM

## 2019-10-14 DIAGNOSIS — J1282 Pneumonia due to coronavirus disease 2019: Secondary | ICD-10-CM | POA: Diagnosis present

## 2019-10-14 DIAGNOSIS — E785 Hyperlipidemia, unspecified: Secondary | ICD-10-CM | POA: Diagnosis present

## 2019-10-14 DIAGNOSIS — L8990 Pressure ulcer of unspecified site, unspecified stage: Secondary | ICD-10-CM | POA: Diagnosis not present

## 2019-10-14 DIAGNOSIS — I428 Other cardiomyopathies: Secondary | ICD-10-CM | POA: Diagnosis present

## 2019-10-14 DIAGNOSIS — I5082 Biventricular heart failure: Secondary | ICD-10-CM | POA: Diagnosis present

## 2019-10-14 DIAGNOSIS — E1142 Type 2 diabetes mellitus with diabetic polyneuropathy: Secondary | ICD-10-CM | POA: Diagnosis present

## 2019-10-14 DIAGNOSIS — Z91138 Patient's unintentional underdosing of medication regimen for other reason: Secondary | ICD-10-CM

## 2019-10-14 DIAGNOSIS — I132 Hypertensive heart and chronic kidney disease with heart failure and with stage 5 chronic kidney disease, or end stage renal disease: Principal | ICD-10-CM | POA: Diagnosis present

## 2019-10-14 DIAGNOSIS — R600 Localized edema: Secondary | ICD-10-CM

## 2019-10-14 DIAGNOSIS — R578 Other shock: Secondary | ICD-10-CM | POA: Diagnosis not present

## 2019-10-14 DIAGNOSIS — R579 Shock, unspecified: Secondary | ICD-10-CM | POA: Diagnosis not present

## 2019-10-14 DIAGNOSIS — D638 Anemia in other chronic diseases classified elsewhere: Secondary | ICD-10-CM | POA: Diagnosis present

## 2019-10-14 DIAGNOSIS — I509 Heart failure, unspecified: Secondary | ICD-10-CM

## 2019-10-14 DIAGNOSIS — E11610 Type 2 diabetes mellitus with diabetic neuropathic arthropathy: Secondary | ICD-10-CM | POA: Diagnosis present

## 2019-10-14 DIAGNOSIS — I469 Cardiac arrest, cause unspecified: Secondary | ICD-10-CM | POA: Diagnosis not present

## 2019-10-14 DIAGNOSIS — N186 End stage renal disease: Secondary | ICD-10-CM

## 2019-10-14 DIAGNOSIS — Z794 Long term (current) use of insulin: Secondary | ICD-10-CM

## 2019-10-14 DIAGNOSIS — J9690 Respiratory failure, unspecified, unspecified whether with hypoxia or hypercapnia: Secondary | ICD-10-CM

## 2019-10-14 DIAGNOSIS — I2699 Other pulmonary embolism without acute cor pulmonale: Secondary | ICD-10-CM | POA: Diagnosis not present

## 2019-10-14 DIAGNOSIS — Z8249 Family history of ischemic heart disease and other diseases of the circulatory system: Secondary | ICD-10-CM

## 2019-10-14 DIAGNOSIS — E1165 Type 2 diabetes mellitus with hyperglycemia: Secondary | ICD-10-CM | POA: Diagnosis not present

## 2019-10-14 DIAGNOSIS — E11621 Type 2 diabetes mellitus with foot ulcer: Secondary | ICD-10-CM | POA: Diagnosis present

## 2019-10-14 DIAGNOSIS — J9600 Acute respiratory failure, unspecified whether with hypoxia or hypercapnia: Secondary | ICD-10-CM

## 2019-10-14 DIAGNOSIS — J9601 Acute respiratory failure with hypoxia: Secondary | ICD-10-CM

## 2019-10-14 DIAGNOSIS — Z6841 Body Mass Index (BMI) 40.0 and over, adult: Secondary | ICD-10-CM

## 2019-10-14 DIAGNOSIS — K219 Gastro-esophageal reflux disease without esophagitis: Secondary | ICD-10-CM | POA: Diagnosis present

## 2019-10-14 DIAGNOSIS — E1169 Type 2 diabetes mellitus with other specified complication: Secondary | ICD-10-CM | POA: Diagnosis present

## 2019-10-14 DIAGNOSIS — Z95828 Presence of other vascular implants and grafts: Secondary | ICD-10-CM

## 2019-10-14 DIAGNOSIS — G931 Anoxic brain damage, not elsewhere classified: Secondary | ICD-10-CM | POA: Diagnosis not present

## 2019-10-14 DIAGNOSIS — D631 Anemia in chronic kidney disease: Secondary | ICD-10-CM | POA: Diagnosis present

## 2019-10-14 DIAGNOSIS — H0520 Unspecified exophthalmos: Secondary | ICD-10-CM | POA: Diagnosis present

## 2019-10-14 DIAGNOSIS — Z79899 Other long term (current) drug therapy: Secondary | ICD-10-CM

## 2019-10-14 DIAGNOSIS — T501X6A Underdosing of loop [high-ceiling] diuretics, initial encounter: Secondary | ICD-10-CM | POA: Diagnosis present

## 2019-10-14 DIAGNOSIS — R140 Abdominal distension (gaseous): Secondary | ICD-10-CM

## 2019-10-14 DIAGNOSIS — Z833 Family history of diabetes mellitus: Secondary | ICD-10-CM

## 2019-10-14 DIAGNOSIS — G9341 Metabolic encephalopathy: Secondary | ICD-10-CM | POA: Diagnosis present

## 2019-10-14 DIAGNOSIS — R34 Anuria and oliguria: Secondary | ICD-10-CM | POA: Diagnosis not present

## 2019-10-14 DIAGNOSIS — R609 Edema, unspecified: Secondary | ICD-10-CM

## 2019-10-14 DIAGNOSIS — L97528 Non-pressure chronic ulcer of other part of left foot with other specified severity: Secondary | ICD-10-CM | POA: Diagnosis present

## 2019-10-14 DIAGNOSIS — R0602 Shortness of breath: Secondary | ICD-10-CM

## 2019-10-14 DIAGNOSIS — Z5329 Procedure and treatment not carried out because of patient's decision for other reasons: Secondary | ICD-10-CM | POA: Diagnosis not present

## 2019-10-14 DIAGNOSIS — J811 Chronic pulmonary edema: Secondary | ICD-10-CM

## 2019-10-14 DIAGNOSIS — E872 Acidosis: Secondary | ICD-10-CM | POA: Diagnosis not present

## 2019-10-14 DIAGNOSIS — Z83438 Family history of other disorder of lipoprotein metabolism and other lipidemia: Secondary | ICD-10-CM

## 2019-10-14 DIAGNOSIS — I480 Paroxysmal atrial fibrillation: Secondary | ICD-10-CM | POA: Diagnosis present

## 2019-10-14 DIAGNOSIS — R739 Hyperglycemia, unspecified: Secondary | ICD-10-CM | POA: Diagnosis present

## 2019-10-14 DIAGNOSIS — U071 COVID-19: Secondary | ICD-10-CM | POA: Diagnosis present

## 2019-10-14 DIAGNOSIS — I2510 Atherosclerotic heart disease of native coronary artery without angina pectoris: Secondary | ICD-10-CM | POA: Diagnosis present

## 2019-10-14 DIAGNOSIS — Z9119 Patient's noncompliance with other medical treatment and regimen: Secondary | ICD-10-CM

## 2019-10-14 DIAGNOSIS — N179 Acute kidney failure, unspecified: Secondary | ICD-10-CM | POA: Diagnosis present

## 2019-10-14 LAB — URINALYSIS, MICROSCOPIC (REFLEX)

## 2019-10-14 LAB — CBC
HCT: 33 % — ABNORMAL LOW (ref 39.0–52.0)
Hemoglobin: 10.2 g/dL — ABNORMAL LOW (ref 13.0–17.0)
MCH: 30.4 pg (ref 26.0–34.0)
MCHC: 30.9 g/dL (ref 30.0–36.0)
MCV: 98.5 fL (ref 80.0–100.0)
Platelets: 175 10*3/uL (ref 150–400)
RBC: 3.35 MIL/uL — ABNORMAL LOW (ref 4.22–5.81)
RDW: 16.4 % — ABNORMAL HIGH (ref 11.5–15.5)
WBC: 4.5 10*3/uL (ref 4.0–10.5)
nRBC: 0 % (ref 0.0–0.2)

## 2019-10-14 LAB — URINALYSIS, ROUTINE W REFLEX MICROSCOPIC
Bilirubin Urine: NEGATIVE
Glucose, UA: NEGATIVE mg/dL
Ketones, ur: NEGATIVE mg/dL
Nitrite: NEGATIVE
Protein, ur: >300 mg/dL — AB
Specific Gravity, Urine: 1.025 (ref 1.005–1.030)
pH: 5 (ref 5.0–8.0)

## 2019-10-14 MED ORDER — SODIUM CHLORIDE 0.9% FLUSH
3.0000 mL | Freq: Once | INTRAVENOUS | Status: AC
  Administered 2019-10-15: 3 mL via INTRAVENOUS

## 2019-10-14 NOTE — ED Triage Notes (Addendum)
Per [facility] pt from home, reports BLE edema and abd pain/distention x6 days. Had fluid removed in December   BP 156/94 HR 100 RR 26-28 90% RA, 100% 3L [place]  CBG 132 98.7

## 2019-10-15 ENCOUNTER — Inpatient Hospital Stay (HOSPITAL_COMMUNITY): Payer: BC Managed Care – PPO

## 2019-10-15 ENCOUNTER — Emergency Department (HOSPITAL_COMMUNITY): Payer: BC Managed Care – PPO

## 2019-10-15 DIAGNOSIS — Z6841 Body Mass Index (BMI) 40.0 and over, adult: Secondary | ICD-10-CM | POA: Diagnosis not present

## 2019-10-15 DIAGNOSIS — I132 Hypertensive heart and chronic kidney disease with heart failure and with stage 5 chronic kidney disease, or end stage renal disease: Secondary | ICD-10-CM | POA: Diagnosis present

## 2019-10-15 DIAGNOSIS — N184 Chronic kidney disease, stage 4 (severe): Secondary | ICD-10-CM | POA: Diagnosis not present

## 2019-10-15 DIAGNOSIS — I2699 Other pulmonary embolism without acute cor pulmonale: Secondary | ICD-10-CM | POA: Diagnosis not present

## 2019-10-15 DIAGNOSIS — L97521 Non-pressure chronic ulcer of other part of left foot limited to breakdown of skin: Secondary | ICD-10-CM | POA: Diagnosis not present

## 2019-10-15 DIAGNOSIS — Z9114 Patient's other noncompliance with medication regimen: Secondary | ICD-10-CM

## 2019-10-15 DIAGNOSIS — I5043 Acute on chronic combined systolic (congestive) and diastolic (congestive) heart failure: Secondary | ICD-10-CM | POA: Diagnosis present

## 2019-10-15 DIAGNOSIS — E11621 Type 2 diabetes mellitus with foot ulcer: Secondary | ICD-10-CM | POA: Diagnosis not present

## 2019-10-15 DIAGNOSIS — I5021 Acute systolic (congestive) heart failure: Secondary | ICD-10-CM | POA: Diagnosis not present

## 2019-10-15 DIAGNOSIS — Z7901 Long term (current) use of anticoagulants: Secondary | ICD-10-CM

## 2019-10-15 DIAGNOSIS — R34 Anuria and oliguria: Secondary | ICD-10-CM | POA: Diagnosis not present

## 2019-10-15 DIAGNOSIS — L97421 Non-pressure chronic ulcer of left heel and midfoot limited to breakdown of skin: Secondary | ICD-10-CM | POA: Diagnosis not present

## 2019-10-15 DIAGNOSIS — R578 Other shock: Secondary | ICD-10-CM | POA: Diagnosis not present

## 2019-10-15 DIAGNOSIS — Z794 Long term (current) use of insulin: Secondary | ICD-10-CM

## 2019-10-15 DIAGNOSIS — E1122 Type 2 diabetes mellitus with diabetic chronic kidney disease: Secondary | ICD-10-CM | POA: Diagnosis present

## 2019-10-15 DIAGNOSIS — N183 Chronic kidney disease, stage 3 unspecified: Secondary | ICD-10-CM | POA: Diagnosis not present

## 2019-10-15 DIAGNOSIS — G9341 Metabolic encephalopathy: Secondary | ICD-10-CM | POA: Diagnosis present

## 2019-10-15 DIAGNOSIS — R609 Edema, unspecified: Secondary | ICD-10-CM | POA: Diagnosis not present

## 2019-10-15 DIAGNOSIS — T501X6A Underdosing of loop [high-ceiling] diuretics, initial encounter: Secondary | ICD-10-CM | POA: Diagnosis present

## 2019-10-15 DIAGNOSIS — U071 COVID-19: Secondary | ICD-10-CM | POA: Diagnosis present

## 2019-10-15 DIAGNOSIS — N185 Chronic kidney disease, stage 5: Secondary | ICD-10-CM

## 2019-10-15 DIAGNOSIS — I480 Paroxysmal atrial fibrillation: Secondary | ICD-10-CM

## 2019-10-15 DIAGNOSIS — J9600 Acute respiratory failure, unspecified whether with hypoxia or hypercapnia: Secondary | ICD-10-CM | POA: Diagnosis not present

## 2019-10-15 DIAGNOSIS — E46 Unspecified protein-calorie malnutrition: Secondary | ICD-10-CM | POA: Diagnosis present

## 2019-10-15 DIAGNOSIS — J80 Acute respiratory distress syndrome: Secondary | ICD-10-CM | POA: Diagnosis not present

## 2019-10-15 DIAGNOSIS — E1142 Type 2 diabetes mellitus with diabetic polyneuropathy: Secondary | ICD-10-CM | POA: Diagnosis present

## 2019-10-15 DIAGNOSIS — I5023 Acute on chronic systolic (congestive) heart failure: Secondary | ICD-10-CM | POA: Diagnosis not present

## 2019-10-15 DIAGNOSIS — L97528 Non-pressure chronic ulcer of other part of left foot with other specified severity: Secondary | ICD-10-CM | POA: Diagnosis present

## 2019-10-15 DIAGNOSIS — I469 Cardiac arrest, cause unspecified: Secondary | ICD-10-CM | POA: Diagnosis not present

## 2019-10-15 DIAGNOSIS — R188 Other ascites: Secondary | ICD-10-CM | POA: Diagnosis not present

## 2019-10-15 DIAGNOSIS — I509 Heart failure, unspecified: Secondary | ICD-10-CM | POA: Diagnosis present

## 2019-10-15 DIAGNOSIS — N179 Acute kidney failure, unspecified: Secondary | ICD-10-CM | POA: Diagnosis present

## 2019-10-15 DIAGNOSIS — J9621 Acute and chronic respiratory failure with hypoxia: Secondary | ICD-10-CM | POA: Diagnosis not present

## 2019-10-15 DIAGNOSIS — J9601 Acute respiratory failure with hypoxia: Secondary | ICD-10-CM | POA: Diagnosis not present

## 2019-10-15 DIAGNOSIS — G931 Anoxic brain damage, not elsewhere classified: Secondary | ICD-10-CM | POA: Diagnosis not present

## 2019-10-15 DIAGNOSIS — I5082 Biventricular heart failure: Secondary | ICD-10-CM | POA: Diagnosis present

## 2019-10-15 DIAGNOSIS — E1169 Type 2 diabetes mellitus with other specified complication: Secondary | ICD-10-CM | POA: Diagnosis present

## 2019-10-15 DIAGNOSIS — J1282 Pneumonia due to coronavirus disease 2019: Secondary | ICD-10-CM | POA: Diagnosis present

## 2019-10-15 DIAGNOSIS — I428 Other cardiomyopathies: Secondary | ICD-10-CM | POA: Diagnosis present

## 2019-10-15 DIAGNOSIS — E11610 Type 2 diabetes mellitus with diabetic neuropathic arthropathy: Secondary | ICD-10-CM | POA: Diagnosis present

## 2019-10-15 DIAGNOSIS — E872 Acidosis: Secondary | ICD-10-CM | POA: Diagnosis not present

## 2019-10-15 LAB — COMPREHENSIVE METABOLIC PANEL
ALT: 17 U/L (ref 0–44)
AST: 37 U/L (ref 15–41)
Albumin: 2.8 g/dL — ABNORMAL LOW (ref 3.5–5.0)
Alkaline Phosphatase: 90 U/L (ref 38–126)
Anion gap: 14 (ref 5–15)
BUN: 95 mg/dL — ABNORMAL HIGH (ref 6–20)
CO2: 19 mmol/L — ABNORMAL LOW (ref 22–32)
Calcium: 8.3 mg/dL — ABNORMAL LOW (ref 8.9–10.3)
Chloride: 108 mmol/L (ref 98–111)
Creatinine, Ser: 4.81 mg/dL — ABNORMAL HIGH (ref 0.61–1.24)
GFR calc Af Amer: 16 mL/min — ABNORMAL LOW (ref >60)
GFR calc non Af Amer: 14 mL/min — ABNORMAL LOW (ref >60)
Glucose, Bld: 103 mg/dL — ABNORMAL HIGH (ref 70–99)
Potassium: 4.6 mmol/L (ref 3.5–5.1)
Sodium: 141 mmol/L (ref 135–145)
Total Bilirubin: 1.3 mg/dL — ABNORMAL HIGH (ref 0.3–1.2)
Total Protein: 7.3 g/dL (ref 6.5–8.1)

## 2019-10-15 LAB — BRAIN NATRIURETIC PEPTIDE: B Natriuretic Peptide: 1305.3 pg/mL — ABNORMAL HIGH (ref 0.0–100.0)

## 2019-10-15 LAB — ECHOCARDIOGRAM COMPLETE
Height: 71 in
Weight: 4797.21 oz

## 2019-10-15 LAB — LIPASE, BLOOD: Lipase: 24 U/L (ref 11–51)

## 2019-10-15 LAB — FIBRINOGEN: Fibrinogen: 266 mg/dL (ref 210–475)

## 2019-10-15 LAB — TROPONIN I (HIGH SENSITIVITY): Troponin I (High Sensitivity): 41 ng/L — ABNORMAL HIGH (ref <18)

## 2019-10-15 LAB — RESPIRATORY PANEL BY RT PCR (FLU A&B, COVID)
Influenza A by PCR: NEGATIVE
Influenza B by PCR: NEGATIVE
SARS Coronavirus 2 by RT PCR: POSITIVE — AB

## 2019-10-15 LAB — HEMOGLOBIN A1C
Hgb A1c MFr Bld: 6.3 % — ABNORMAL HIGH (ref 4.8–5.6)
Mean Plasma Glucose: 134.11 mg/dL

## 2019-10-15 LAB — PROCALCITONIN: Procalcitonin: 0.61 ng/mL

## 2019-10-15 LAB — D-DIMER, QUANTITATIVE: D-Dimer, Quant: 15.09 ug/mL-FEU — ABNORMAL HIGH (ref 0.00–0.50)

## 2019-10-15 LAB — CBG MONITORING, ED: Glucose-Capillary: 111 mg/dL — ABNORMAL HIGH (ref 70–99)

## 2019-10-15 LAB — FERRITIN: Ferritin: 368 ng/mL — ABNORMAL HIGH (ref 24–336)

## 2019-10-15 LAB — C-REACTIVE PROTEIN: CRP: 5.1 mg/dL — ABNORMAL HIGH (ref <1.0)

## 2019-10-15 MED ORDER — METOLAZONE 2.5 MG PO TABS: 2.5000 mg | ORAL_TABLET | Freq: Every day | ORAL | Status: DC

## 2019-10-15 MED ORDER — FERROUS SULFATE 325 (65 FE) MG PO TABS
325.0000 mg | ORAL_TABLET | Freq: Two times a day (BID) | ORAL | Status: DC
  Administered 2019-10-15 – 2019-10-23 (×16): 325 mg via ORAL
  Filled 2019-10-15 (×16): qty 1

## 2019-10-15 MED ORDER — ADULT MULTIVITAMIN W/MINERALS CH
1.0000 | ORAL_TABLET | Freq: Every day | ORAL | Status: DC
  Administered 2019-10-15 – 2019-10-22 (×8): 1 via ORAL
  Filled 2019-10-15 (×9): qty 1

## 2019-10-15 MED ORDER — AMIODARONE HCL 200 MG PO TABS
200.0000 mg | ORAL_TABLET | Freq: Every day | ORAL | Status: DC
  Administered 2019-10-15 – 2019-10-22 (×8): 200 mg via ORAL
  Filled 2019-10-15 (×9): qty 1

## 2019-10-15 MED ORDER — SODIUM CHLORIDE 0.9% FLUSH
3.0000 mL | Freq: Two times a day (BID) | INTRAVENOUS | Status: DC
  Administered 2019-10-15 – 2019-10-30 (×20): 3 mL via INTRAVENOUS

## 2019-10-15 MED ORDER — INSULIN ASPART 100 UNIT/ML ~~LOC~~ SOLN: 0.0000 [IU] | Freq: Three times a day (TID) | SUBCUTANEOUS | Status: DC

## 2019-10-15 MED ORDER — SODIUM CHLORIDE 0.9 % IV SOLN: 250.0000 mL | INTRAVENOUS | Status: DC | PRN

## 2019-10-15 MED ORDER — GABAPENTIN 300 MG PO CAPS
300.0000 mg | ORAL_CAPSULE | Freq: Every evening | ORAL | Status: DC | PRN
  Administered 2019-10-20: 300 mg via ORAL
  Filled 2019-10-15: qty 1

## 2019-10-15 MED ORDER — ATORVASTATIN CALCIUM 10 MG PO TABS
10.0000 mg | ORAL_TABLET | Freq: Every day | ORAL | Status: DC
  Administered 2019-10-15 – 2019-10-22 (×8): 10 mg via ORAL
  Filled 2019-10-15 (×8): qty 1

## 2019-10-15 MED ORDER — HEPARIN SODIUM (PORCINE) 5000 UNIT/ML IJ SOLN
5000.0000 [IU] | Freq: Three times a day (TID) | INTRAMUSCULAR | Status: DC
  Administered 2019-10-15 – 2019-10-16 (×2): 5000 [IU] via SUBCUTANEOUS
  Filled 2019-10-15 (×3): qty 1

## 2019-10-15 MED ORDER — APIXABAN 5 MG PO TABS
5.0000 mg | ORAL_TABLET | Freq: Two times a day (BID) | ORAL | Status: DC
  Administered 2019-10-15: 14:00:00 5 mg via ORAL
  Filled 2019-10-15 (×2): qty 1

## 2019-10-15 MED ORDER — ISOSORB DINITRATE-HYDRALAZINE 20-37.5 MG PO TABS
1.0000 | ORAL_TABLET | Freq: Two times a day (BID) | ORAL | Status: DC
  Administered 2019-10-16 – 2019-10-22 (×12): 1 via ORAL
  Filled 2019-10-15 (×21): qty 1

## 2019-10-15 MED ORDER — SODIUM CHLORIDE 0.9% FLUSH: 3.0000 mL | INTRAVENOUS | Status: DC | PRN

## 2019-10-15 MED ORDER — FUROSEMIDE 10 MG/ML IJ SOLN
20.0000 mg/h | INTRAVENOUS | Status: DC
  Administered 2019-10-15: 15:00:00 12 mg/h via INTRAVENOUS
  Filled 2019-10-15 (×2): qty 25

## 2019-10-15 MED ORDER — FUROSEMIDE 10 MG/ML IJ SOLN: 80.0000 mg | Freq: Two times a day (BID) | INTRAMUSCULAR | Status: DC

## 2019-10-15 MED ORDER — ACETAMINOPHEN 325 MG PO TABS
650.0000 mg | ORAL_TABLET | ORAL | Status: DC | PRN
  Administered 2019-10-16 – 2019-10-20 (×3): 650 mg via ORAL
  Filled 2019-10-15 (×3): qty 2

## 2019-10-15 MED ORDER — ONDANSETRON HCL 4 MG/2ML IJ SOLN
4.0000 mg | Freq: Four times a day (QID) | INTRAMUSCULAR | Status: DC | PRN
  Administered 2019-10-16 – 2019-10-22 (×3): 4 mg via INTRAVENOUS
  Filled 2019-10-15 (×4): qty 2

## 2019-10-15 MED ORDER — LORAZEPAM 2 MG/ML IJ SOLN: 0.5000 mg | Freq: Four times a day (QID) | INTRAMUSCULAR | Status: DC | PRN

## 2019-10-15 MED ORDER — FUROSEMIDE 10 MG/ML IJ SOLN
80.0000 mg | Freq: Once | INTRAMUSCULAR | Status: AC
  Administered 2019-10-15: 11:00:00 80 mg via INTRAVENOUS
  Filled 2019-10-15: qty 8

## 2019-10-15 MED ORDER — CARVEDILOL 25 MG PO TABS
25.0000 mg | ORAL_TABLET | Freq: Two times a day (BID) | ORAL | Status: DC
  Administered 2019-10-15 – 2019-10-22 (×14): 25 mg via ORAL
  Filled 2019-10-15 (×6): qty 1
  Filled 2019-10-15: qty 2
  Filled 2019-10-15 (×4): qty 1
  Filled 2019-10-15: qty 2
  Filled 2019-10-15 (×3): qty 1

## 2019-10-15 NOTE — H&P (Signed)
History and Physical    Joshua Massey NID:[id number] DOB: [birth year] DOA: 10/06/2019  PCP: [name], [name], MD   Patient coming from: Home  I have personally briefly reviewed patient's old medical records in [place]  Chief Complaint: Swelling, SOB  HPI: Joshua Massey is a 44 y.o. male with medical history significant of IDDM, HTN, and chronic systolic heart failure EF 20-25%,CKD stage IV, morbid obesity, poor compliance with medications, presented with increasing redness and increasing short of breath for 3 weeks.  Patient claims he ran out of his torsemide 3 weeks ago, although he continues to take the other 3 CHF medication including hydralazine, Coreg, he started develop increasing leg, scrotum, abdominal wall swelling. He was able to restarted torsemide about 1 week ago, but his swelling was never improved, and in addition he also developed short of breath with minimal exertion.  Denies any chest pain, no cough no fever chills. Also has ulcer from hronic wound on left foot and leg.   ED Course: BNP 1305, creatinine 4.8, K 4.6, Hgb 10.2.  X-ray shows cardiomegaly and the lungs congestion, Lasix was given.  Review of Systems: As per HPI otherwise 10 point review of systems negative.    Past Medical History:  Diagnosis Date  . 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 Chronic kidney disease   . Diabetes mellitus    adult-onset, type 2  . Exogenous obesity   . GERD (gastroesophageal reflux disease)   . Hypertension   . Left foot infection 06/21/2018  . Osteomyelitis of foot, left, acute ([place]) 07/04/2018    Past Surgical History:  Procedure Laterality Date  . ANKLE SURGERY  2003   plate and screws  . APPLICATION OF WOUND VAC Left 06/12/2018   Procedure: APPLICATION OF WOUND VAC;  Surgeon: Mcarthur Rossetti, MD;  Location: [place];  Service: Orthopedics;  Laterality: Left;  . CARDIAC CATHETERIZATION  02/16/2012   50% LAD lesion in mid  vessel, otherwise no significant obstructive CAD  . HIP PINNING  1990   bilateral  . I & D EXTREMITY Left 06/10/2018   Procedure: IRRIGATION AND DEBRIDEMENT OF FOOT;  Surgeon: Mcarthur Rossetti, MD;  Location: [place];  Service: Orthopedics;  Laterality: Left;  . I & D EXTREMITY Left 06/12/2018   Procedure: REPEAT IRRIGATION AND DEBRIDEMENT LEFT FOOT;  Surgeon: Mcarthur Rossetti, MD;  Location: [place];  Service: Orthopedics;  Laterality: Left;  . I & D EXTREMITY Left 07/04/2018   Procedure: DEBRIDEMENT OF LEFT FOOT;  Surgeon: Newt Minion, MD;  Location: [place];  Service: Orthopedics;  Laterality: Left;  . IR GENERIC HISTORICAL  09/01/2016   IR FLUORO GUIDE CV LINE RIGHT 09/01/2016 Marybelle Killings, MD [facility]  . IR GENERIC HISTORICAL  09/01/2016   IR US GUIDE VASC ACCESS RIGHT 09/01/2016 Marybelle Killings, MD [facility]  . IR GENERIC HISTORICAL  09/16/2016   IR REMOVAL TUN CV CATH W/O FL 09/16/2016 Arne Cleveland, MD [facility]  . KNEE ARTHROSCOPY Left 08/30/2016   Procedure: ARTHROSCOPY KNEE;  Surgeon: Renette Butters, MD;  Location: [place];  Service: Orthopedics;  Laterality: Left;  . LEFT AND RIGHT HEART CATHETERIZATION WITH CORONARY ANGIOGRAM N/A 02/16/2012   Procedure: LEFT AND RIGHT HEART CATHETERIZATION WITH CORONARY ANGIOGRAM;  Surgeon: Pixie Casino, MD;  [facility];  Service: Cardiovascular;  Laterality: N/A;  . TRANSTHORACIC ECHOCARDIOGRAM  08/31/2012   EF 35-40%, no significant wall motion abnormalities,  diastolic relaxation abnormality - no longer needed LifeVest; LV cavity size mod dilated with mod conc hypertrophy, systolic function mod reduced; mild MR, LA mod dilated     reports that he has never smoked. He has never used smokeless tobacco. He reports that he does not drink alcohol or use drugs.  No Known Allergies  Family History  Problem Relation Age of Onset  . Coronary artery disease Brother        CABG at 86, DM  . Heart disease Brother 83        CAD  . Diabetes Mother   . Hypertension Mother   . Hyperlipidemia Mother   . Heart disease Mother   . Diabetes Father   . Hypertension Father      Prior to Admission medications   Medication Sig Start Date End Date Taking? Authorizing Provider  acetaminophen (TYLENOL) 500 MG tablet Take 1,000 mg by mouth every 6 (six) hours as needed for mild pain.   Yes [provider]  apixaban (ELIQUIS) 5 MG TABS tablet Take 1 tablet (5 mg total) by mouth 2 (two) times daily. 11/06/18  Yes [name], [name], MD  atorvastatin (LIPITOR) 10 MG tablet Take 1 tablet (10 mg total) by mouth daily with supper. Patient taking differently: Take 10 mg by mouth every morning.  08/07/18  Yes [name], [name], MD  carvedilol (COREG) 25 MG tablet TAKE 1 TABLET BY MOUTH TWICE DAILY WITH  A  MEAL. Patient taking differently: Take 25 mg by mouth 2 (two) times daily with a meal.  06/17/19  Yes [name], [name], MD  Ferrous Sulfate (IRON) 325 (65 Fe) MG TABS TAKE 1 TABLET BY MOUTH TWICE DAILY WITH MEALS  Patient taking differently: Take 325 mg by mouth 2 (two) times daily with a meal.  07/12/19  Yes [name], [name], MD  gabapentin (NEURONTIN) 300 MG capsule Take 1 capsule (300 mg total) by mouth at bedtime. Patient taking differently: Take 300 mg by mouth at bedtime as needed (pain).  10/25/18  Yes Rayburn, Joshua Mends, PA-C  insulin aspart (NOVOLOG FLEXPEN) 100 UNIT/ML FlexPen Inject 0-15 Units into the skin 3 (three) times daily with meals. 0-15U/Subcu/TIDw/meals//CBG70-120:0U/CBG121-150:2U/CBG151-200:3U/CBG201-250:5U/CBG251-300:8U/CBG301-350:11U/CBG351-400:15U//CBG>400:Call MD 11/14/18  Yes [name], [name], MD  Insulin Glargine (LANTUS) 100 UNIT/ML Solostar Pen Inject 5 Units into the skin at bedtime. Patient taking differently: Inject 1 Units into the skin at bedtime.  11/06/18  Yes [name], [name], MD  isosorbide-hydrALAZINE (BIDIL) 20-37.5 MG tablet Take 1 tablet by mouth 2 (two) times daily. 07/24/19  Yes Joshua Dresser, MD  Multiple Vitamin (MULTIVITAMIN WITH MINERALS) TABS tablet Take 1 tablet by mouth daily. 09/07/18  Yes Joshua Haff, MD  silver sulfADIAZINE (SILVADENE) 1 % cream Apply topically daily. Silvadene plus dry dressing to plantar ulcer left foot.  Change daily. Patient taking differently: Apply 1 application topically daily as needed (skin care). Silvadene plus dry dressing to plantar ulcer left foot.  Change daily. 09/07/18  Yes Joshua Haff, MD  torsemide (DEMADEX) 100 MG tablet Take 1 tablet (100 mg total) by mouth daily. 07/24/19 10/15/19 Yes Joshua Dresser, MD  Vitamin D, Ergocalciferol, (DRISDOL) 1.25 MG (50000 UT) CAPS capsule Take 1 capsule (50,000 Units total) by mouth every 7 (seven) days. Patient taking differently: Take 50,000 Units by mouth every 7 (seven) days. Friday 11/06/18  Yes [name], [name], MD  amiodarone (PACERONE) 200 MG tablet Take 1 tablet (200 mg total) by mouth daily. 06/04/19 07/04/19  Darliss Cheney, MD  Continuous Blood Gluc Receiver (FREESTYLE LIBRE 14 DAY READER) DEVI 1 Device by Does not apply route daily. 11/06/18   [name], [name], MD  Continuous Blood Gluc Sensor (FREESTYLE LIBRE 14 DAY SENSOR) MISC Inject 1 Device into the skin 4 (four) times daily as needed. 11/06/18   [name], [name], MD  glucose blood (FREESTYLE TEST STRIPS) test strip Use as instructed 09/02/16   Ghimire, Henreitta Leber, MD  glucose monitoring kit (FREESTYLE) monitoring kit 1 each by Does not apply route 4 (four) times daily - after meals and at bedtime. 1 month Diabetic Testing Supplies for QAC-QHS accuchecks. 09/02/16   Ghimire, Henreitta Leber, MD  Insulin Pen Needle 32G X 8 MM MISC Use as directed 09/02/16   Jonetta Osgood, MD  Lancets (FREESTYLE) lancets Use as instructed 09/02/16   Ghimire, Henreitta Leber, MD  oxyCODONE-acetaminophen (PERCOCET/ROXICET) 5-325 MG tablet Take 1 tablet by mouth every 8 (eight) hours as needed for moderate pain or severe pain. Patient not taking: Reported on 10/15/2019  10/25/18   Milas Gain, PA-C    Physical Exam: Vitals:   10/15/19 1115 10/15/19 1130 10/15/19 1143 10/15/19 1145  BP: (!) 142/95 (!) 142/95  (!) 144/99  Pulse: (!) 101 91  90  Resp:  (!) 22  14  Temp:  98.9 F (37.2 C)    TempSrc:  Oral    SpO2: 99% 96%  97%  Weight:   136 kg   Height:   _0  (1.803 m)     Constitutional: NAD, calm, comfortable Vitals:   10/15/19 1115 10/15/19 1130 10/15/19 1143 10/15/19 1145  BP: (!) 142/95 (!) 142/95  (!) 144/99  Pulse: (!) 101 91  90  Resp:  (!) 22  14  Temp:  98.9 F (37.2 C)    TempSrc:  Oral    SpO2: 99% 96%  97%  Weight:   136 kg   Height:   _1  (1.803 m)    Eyes: PERRL, lids and conjunctivae normal ENMT: Mucous membranes are moist. Posterior pharynx clear of any exudate or lesions.Normal dentition.  Neck: normal, supple, no masses, no thyromegaly Respiratory: Bilateral fine crackles on bilateral bases. Normal respiratory effort. No accessory muscle use.  Cardiovascular: Regular rate and rhythm, no murmurs / rubs / gallops.  Anasarca. 2+ pedal pulses. No carotid bruits.  Abdomen: no tenderness, no masses palpated. No hepatosplenomegaly. Bowel sounds positive.  Musculoskeletal: no clubbing / cyanosis. No joint deformity upper and lower extremities. Good ROM, no contractures. Normal muscle tone.  Skin: 1 circular ulcer with eschar on left foot as shown on the picture, large area of skin excoriation on the right lateral shin Neurologic: CN 2-12 grossly intact. Sensation intact, DTR normal. Strength 5/5 in all 4.  Psychiatric: Normal judgment and insight. Alert and oriented x 3. Normal mood.        (Anything < 9 systems with 2 bullets each down codes to level 1) (If patient refuses exam can't bill higher level) (Make sure to document decubitus ulcers present on admission -- if possible -- and whether patient has chronic indwelling catheter at time of admission)  Labs on Admission: I have personally reviewed  following labs and imaging studies  CBC: Recent Labs  Lab 10/13/2019 2206  WBC 4.5  HGB 10.2*  HCT 33.0*  MCV 98.5  PLT 935   Basic Metabolic Panel: Recent Labs  Lab 10/05/2019 2206  NA 141  K 4.6  CL 108  CO2 19*  GLUCOSE 103*  BUN 95*  CREATININE 4.81*  CALCIUM 8.3*   GFR: Estimated Creatinine Clearance: 28.2 mL/min (A) (by C-Massey formula based on SCr of 4.81 mg/dL (H)). Liver Function Tests: Recent Labs  Lab 10/05/2019 2206  AST 37  ALT 17  ALKPHOS 90  BILITOT 1.3*  PROT 7.3  ALBUMIN 2.8*   Recent Labs  Lab 10/05/2019 2206  LIPASE 24   No results for input(s): AMMONIA in the last 168 hours. Coagulation Profile: No results for input(s): INR, PROTIME in the last 168 hours. Cardiac Enzymes: No results for input(s): CKTOTAL, CKMB, CKMBINDEX, TROPONINI in the last 168 hours. BNP (last 3 results) No results for input(s): PROBNP in the last 8760 hours. HbA1C: No results for input(s): HGBA1C in the last 72 hours. CBG: No results for input(s): GLUCAP in the last 168 hours. Lipid Profile: No results for input(s): CHOL, HDL, LDLCALC, TRIG, CHOLHDL, LDLDIRECT in the last 72 hours. Thyroid Function Tests: No results for input(s): TSH, T4TOTAL, FREET4, T3FREE, THYROIDAB in the last 72 hours. Anemia Panel: No results for input(s): VITAMINB12, FOLATE, FERRITIN, TIBC, IRON, RETICCTPCT in the last 72 hours. Urine analysis:    Component Value Date/Time   COLORURINE YELLOW 10/08/2019 2224   APPEARANCEUR CLEAR 10/18/2019 2224   LABSPEC 1.025 09/30/2019 2224   PHURINE 5.0 09/25/2019 2224   GLUCOSEU NEGATIVE 09/30/2019 2224   HGBUR LARGE (A) 10/09/2019 2224   BILIRUBINUR NEGATIVE 09/27/2019 2224   KETONESUR NEGATIVE 10/11/2019 2224   PROTEINUR >300 (A) 09/30/2019 2224   UROBILINOGEN 0.2 09/02/2009 0935   NITRITE NEGATIVE 09/27/2019 2224   LEUKOCYTESUR TRACE (A) 10/13/2019 2224    Radiological Exams on Admission: DG Chest Port 1 View  Result Date: 10/15/2019 CLINICAL  DATA:  Lower extremity edema EXAM: PORTABLE CHEST 1 VIEW COMPARISON:  05/22/2019 FINDINGS: Very low lung volumes limiting evaluation. Pulmonary vascular congestion with possible mild perihilar edema. No significant pleural effusion. No pneumothorax. Stable cardiomegaly. IMPRESSION: Pulmonary vascular congestion with possible mild edema. Electronically Signed   By: Macy Mis M.D.   On: 10/15/2019 09:11   DG Foot Complete Left  Result Date: 10/15/2019 CLINICAL DATA:  Nonhealing wound to left foot. EXAM: LEFT FOOT - COMPLETE 3+ VIEW COMPARISON:  August 29, 2018. FINDINGS: Vascular calcifications are noted. Continued chronic dislocations degenerative changes seen involving the tarsometatarsal joints. Stable defect of middle portion of second middle phalanx is noted. Diffuse soft tissue swelling is noted. No acute lytic destruction is seen to suggest osteomyelitis. IMPRESSION: Stable chronic findings as described above. No acute lytic destruction is seen to suggest osteomyelitis. Diffuse soft tissue swelling is noted. Electronically Signed   By: Marijo Conception M.D.   On: 10/15/2019 09:30    EKG: Independently reviewed.  Normal sinus rhythm no acute ST changes  Assessment/Plan Active Problems:   CHF (congestive heart failure) (HCC)  Acute on chronic systolic CHF decompensation -From noncompliant with diuresis, start aggressive diuresis, Lasix 80 mg twice daily plus metolazone daily, monitor kidney function -Repeat Echo, if EF maintains low he will need AICD evaluation   COVID 19 infection His breathing symptoms likely from fluid overload secondary to CHF decompensation, x-ray shows lung congestion rather than patchy infiltrates, less likely has a concurrent pneumonia on top of fluid overload, check lab, will hold off COVID-19 treatment for now  DM foot ulcer Pt told me the ulcer has been chronic and used to see Dr. Sharol Given but lost follow-up Probably has a underlying infection, will order MRI to  rule out Osteo Talked to  Dr. Sharol Given, who plans to see pt in AM  CKD stage V Stable, now fluid overload  Paroxysmal A. Fib Hold Eliquis in case need OR  IDDM Sliding scale for now   DVT prophylaxis: Heparin subcu  code Status: Full code Family Communication: None at bedside Disposition Plan: PT evaluation, very complicated medical conditions, likely will need more than 5 days hospital stay Consults called: Dr. Sharol Given, cardiology Admission status: Telemetry   Lequita Halt MD [facility] Pager [phone]    10/15/2019, 1:33 PM

## 2019-10-15 NOTE — Consult Note (Signed)
[facility] Nurse Consult Note: Reason for Consult: Bilateral LEs with edema. Right lateral LE with partial thickness wound.  Left plantar aspect of foot with full thickness ulceration. Wound type: venous insufficiency Pressure Injury POA: N/A Measurement: Right lateral LE: Partial thickness wound measuring 2cm x 3cm x 0.1cm with pink, moist wound bed and small to moderate amount of serous exudate Left plantar aspect of foot with 2.2cm round callus with defect in center with 0.3cm depth. No drainage. Wound bed: As noted above Drainage (amount, consistency, odor) As noted above Periwound: Dry, with bulbous areas of elevation, R>L Dressing procedure/placement/frequency: I will provide Nursing with guidance for the care of the right lateral LE partial thickness wound using a silicone foam dressing and having Ortho Tech then place an Unna's Boot to this extremity.  Changes are to be twice weekly on Tuesdays and Fridays.  The right foot, plantar aspect, full thickness wound with surrounding callus wound benefit from debridement to reveal depth of wound and local care.  It is not advised to place an Unna's Boot over this wound that would stay in place for several days, so I will ask Nursing to place a dry boot over this extremity (Kerlix roll gauze topped with 6-inch ACE bandaging). Both heels are to be floated.  [facility] nursing team will not follow, but will remain available to this patient, the nursing and medical teams.  Please re-consult if needed. Thanks, Maudie Flakes, MSN, RN, [place], Arther Abbott  Pager# [phone]

## 2019-10-15 NOTE — ED Provider Notes (Signed)
[facility] EMERGENCY DEPARTMENT Provider Note   CSN: [id number] Arrival date & time: 10/12/2019  2145     History Chief Complaint  Patient presents with  . Leg Swelling  . Abdominal Pain    Joshua Massey is a 44 y.o. male.  Joshua Massey is a 44 y.o. male with history of CHF, diabetes, CKD, GERD, osteomyelitis, who presents to the emergency department via EMS for bilateral lower extremity swelling and abdominal distention over the past 6 days.  States that he has been having worsening edema over the past few weeks, but it has especially gotten bad in the past week.  Edema throughout both legs, and he reports he has noticed swelling in his scrotum and abdomen now.  He denies abdominal pain just states that his abdomen feels distended.  He has not had any vomiting or diarrhea.  Has had an occasional cough and states he does not feel super short of breath but feels more so that he cannot tolerate much activity.  He denies any chest pain or palpitations.  He has chronic wounds to his legs, he is not sure if these are getting worse, states he changes the dressings as much as he is able but his mobility has been limited by his worsening swelling.  He states that last month he was out of his torsemide for a few weeks and that is when the swelling started to get worse, he has been back on his meds for a couple weeks now, but states he feels like he cannot catch up to the swelling.  Called EMS yesterday due to worsening symptoms, patient was found to be satting at 90% on room air, improved on 3 L nasal cannula.  Followed by Dr. Haroldine Laws with Cardiology.  Last echo showed EF of 20 to 25%.        Past Medical History:  Diagnosis Date  . 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 Chronic kidney disease   . Diabetes mellitus    adult-onset, type 2  . Exogenous obesity   . GERD (gastroesophageal reflux disease)   . Hypertension   . Left foot  infection 06/21/2018  . Osteomyelitis of foot, left, acute ([place]) 07/04/2018    Patient Active Problem List   Diagnosis Date Noted  . Physical deconditioning 05/28/2019  . Acute exacerbation of CHF (congestive heart failure) ([place]) 05/23/2019  . Acute on chronic systolic heart failure ([place]) 05/21/2019  . Anemia, iron deficiency 11/06/2018  . Heart failure ([place]) 08/31/2018  . Acute on chronic combined systolic and diastolic CHF (congestive heart failure) ([place]) 08/29/2018  . S/P split thickness skin graft 08/02/2018  . Subacute osteomyelitis, left ankle and foot ([place])   . Non-compliant patient 12/18/2017  . 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 Hypovitaminosis D 11/27/2017  . Diabetic foot ulcer ([place]) 11/27/2017  . Cellulitis in diabetic foot ([place]) 11/27/2017  . Acute heart failure ([place]) 11/27/2017  . Volume overload 11/27/2017  . Hepatopathy 11/27/2017  . Hypokalemia 05/31/2017  . Diabetic peripheral neuropathy ([place]) 12/20/2016  . Hyperlipidemia associated with type 2 diabetes mellitus ([place]) 11/27/2016  . Shingles 10/11/2016  . Status post peripherally inserted central catheter (PICC) central line placement 09/12/2016  . Medication monitoring encounter 09/12/2016  . Arthritis, septic ([place])   . CKD (chronic kidney disease), stage IV ([place])   . AKI (acute kidney injury) ([place])   . Peripheral edema   .  Acute renal failure ([place]) 08/17/2016  . Acute systolic CHF (congestive heart failure) ([place]) 08/17/2016  . Type 2 diabetes mellitus with vascular disease ([place]) 10/21/2013  . Chronic systolic heart failure ([place]) 02/27/2012  . Coronary artery disease, 50% LAD at cath 02/16/12 02/23/2012  . Family history of coronary artery disease 02/23/2012  . Cardiogenic shock ([place]) 02/17/2012  . NSVT (nonsustained ventricular tachycardia) ([place]) 02/16/2012  . Obesity 02/16/2012  . Acute on chronic systolic CHF (congestive heart failure) ([place])  02/16/2012  . CHF (congestive heart failure) ([place]) 02/15/2012  . Hypertension 02/15/2012  . Non-ischemic cardiomyopathy, EF < 20% 02/15/2012  . TINEA CRURIS 09/25/2009  . ALLERGIC RHINITIS CAUSE UNSPECIFIED 09/25/2009  . SCROTAL ABSCESS 09/17/2009  . Hypertensive heart disease with heart failure ([place]) 01/26/2009    Past Surgical History:  Procedure Laterality Date  . ANKLE SURGERY  2003   plate and screws  . APPLICATION OF WOUND VAC Left 06/12/2018   Procedure: APPLICATION OF WOUND VAC;  Surgeon: Mcarthur Rossetti, MD;  Location: [place];  Service: Orthopedics;  Laterality: Left;  . CARDIAC CATHETERIZATION  02/16/2012   50% LAD lesion in mid vessel, otherwise no significant obstructive CAD  . HIP PINNING  1990   bilateral  . I & D EXTREMITY Left 06/10/2018   Procedure: IRRIGATION AND DEBRIDEMENT OF FOOT;  Surgeon: Mcarthur Rossetti, MD;  Location: [place];  Service: Orthopedics;  Laterality: Left;  . I & D EXTREMITY Left 06/12/2018   Procedure: REPEAT IRRIGATION AND DEBRIDEMENT LEFT FOOT;  Surgeon: Mcarthur Rossetti, MD;  Location: [place];  Service: Orthopedics;  Laterality: Left;  . I & D EXTREMITY Left 07/04/2018   Procedure: DEBRIDEMENT OF LEFT FOOT;  Surgeon: Newt Minion, MD;  Location: [place];  Service: Orthopedics;  Laterality: Left;  . IR GENERIC HISTORICAL  09/01/2016   IR FLUORO GUIDE CV LINE RIGHT 09/01/2016 Marybelle Killings, MD [facility]INTER[facility]  . IR GENERIC HISTORICAL  09/01/2016   IR US GUIDE VASC ACCESS RIGHT 09/01/2016 Marybelle Killings, MD [facility]  . IR GENERIC HISTORICAL  09/16/2016   IR REMOVAL TUN CV CATH W/O FL 09/16/2016 Arne Cleveland, MD [facility]  . KNEE ARTHROSCOPY Left 08/30/2016   Procedure: ARTHROSCOPY KNEE;  Surgeon: Renette Butters, MD;  Location: [place];  Service: Orthopedics;  Laterality: Left;  . LEFT AND RIGHT HEART CATHETERIZATION WITH CORONARY ANGIOGRAM N/A 02/16/2012   Procedure: LEFT AND RIGHT HEART CATHETERIZATION WITH CORONARY ANGIOGRAM;   Surgeon: Pixie Casino, MD;  Location: [facility] CATH LAB;  Service: Cardiovascular;  Laterality: N/A;  . TRANSTHORACIC ECHOCARDIOGRAM  08/31/2012   EF 35-40%, no significant wall motion abnormalities, diastolic relaxation abnormality - no longer needed LifeVest; LV cavity size mod dilated with mod conc hypertrophy, systolic function mod reduced; mild MR, LA mod dilated       Family History  Problem Relation Age of Onset  . Coronary artery disease Brother        CABG at 74, DM  . Heart disease Brother 61       CAD  . Diabetes Mother   . Hypertension Mother   . Hyperlipidemia Mother   . Heart disease Mother   . Diabetes Father   . Hypertension Father     Social History   Tobacco Use  . Smoking status: Never Smoker  . Smokeless tobacco: Never Used  Substance Use Topics  . Alcohol use: No  . Drug use: No    Home Medications Prior to Admission medications  Medication Sig Start Date End Date Taking? Authorizing Provider  amiodarone (PACERONE) 200 MG tablet Take 1 tablet (200 mg total) by mouth daily. 06/04/19 07/04/19  Darliss Cheney, MD  apixaban (ELIQUIS) 5 MG TABS tablet Take 1 tablet (5 mg total) by mouth 2 (two) times daily. 11/06/18   [name], [name], MD  atorvastatin (LIPITOR) 10 MG tablet Take 1 tablet (10 mg total) by mouth daily with supper. Patient taking differently: Take 10 mg by mouth every morning.  08/07/18   [name], [name], MD  carvedilol (COREG) 25 MG tablet TAKE 1 TABLET BY MOUTH TWICE DAILY WITH  A  MEAL. 06/17/19   [name], [name], MD  Continuous Blood Gluc Receiver (FREESTYLE LIBRE 14 DAY READER) DEVI 1 Device by Does not apply route daily. 11/06/18   [name], [name], MD  Continuous Blood Gluc Sensor (FREESTYLE LIBRE 14 DAY SENSOR) MISC Inject 1 Device into the skin 4 (four) times daily as needed. 11/06/18   [name], [name], MD  Ferrous Sulfate (IRON) 325 (65 Fe) MG TABS TAKE 1 TABLET BY MOUTH TWICE DAILY WITH MEALS  07/12/19   [name], [name], MD  gabapentin  (NEURONTIN) 300 MG capsule Take 1 capsule (300 mg total) by mouth at bedtime. Patient taking differently: Take 300 mg by mouth at bedtime as needed (pain).  10/25/18   Rayburn, Neta Mends, PA-C  glucose blood (FREESTYLE TEST STRIPS) test strip Use as instructed 09/02/16   Ghimire, Henreitta Leber, MD  glucose monitoring kit (FREESTYLE) monitoring kit 1 each by Does not apply route 4 (four) times daily - after meals and at bedtime. 1 month Diabetic Testing Supplies for QAC-QHS accuchecks. 09/02/16   Ghimire, Henreitta Leber, MD  insulin aspart (NOVOLOG FLEXPEN) 100 UNIT/ML FlexPen Inject 0-15 Units into the skin 3 (three) times daily with meals. 0-15U/Subcu/TIDw/meals//CBG70-120:0U/CBG121-150:2U/CBG151-200:3U/CBG201-250:5U/CBG251-300:8U/CBG301-350:11U/CBG351-400:15U//CBG>400:Call MD 11/14/18   [name], [name], MD  Insulin Glargine (LANTUS) 100 UNIT/ML Solostar Pen Inject 5 Units into the skin at bedtime. Patient taking differently: Inject 1 Units into the skin at bedtime.  11/06/18   [name], [name], MD  Insulin Pen Needle 32G X 8 MM MISC Use as directed 09/02/16   Ghimire, Henreitta Leber, MD  isosorbide-hydrALAZINE (BIDIL) 20-37.5 MG tablet Take 1 tablet by mouth 2 (two) times daily. 07/24/19   Larey Dresser, MD  Lancets (FREESTYLE) lancets Use as instructed 09/02/16   Jonetta Osgood, MD  Multiple Vitamin (MULTIVITAMIN WITH MINERALS) TABS tablet Take 1 tablet by mouth daily. 09/07/18   Bonnielee Haff, MD  oxyCODONE-acetaminophen (PERCOCET/ROXICET) 5-325 MG tablet Take 1 tablet by mouth every 8 (eight) hours as needed for moderate pain or severe pain. 10/25/18   [place]burn, Neta Mends, PA-C  silver sulfADIAZINE (SILVADENE) 1 % cream Apply topically daily. Silvadene plus dry dressing to plantar ulcer left foot.  Change daily. Patient taking differently: Apply 1 application topically daily as needed (skin care). Silvadene plus dry dressing to plantar ulcer left foot.  Change daily. 09/07/18   Bonnielee Haff, MD    torsemide (DEMADEX) 100 MG tablet Take 1 tablet (100 mg total) by mouth daily. 07/24/19 08/23/19  Larey Dresser, MD  Vitamin D, Ergocalciferol, (DRISDOL) 1.25 MG (50000 UT) CAPS capsule Take 1 capsule (50,000 Units total) by mouth every 7 (seven) days. 11/06/18   [name], [name], MD    Allergies    Patient has no known allergies.  Review of Systems   Review of Systems  Constitutional: Negative for chills and fever.  Respiratory: Positive for  shortness of breath. Negative for cough.   Cardiovascular: Positive for leg swelling. Negative for chest pain and palpitations.  Gastrointestinal: Positive for abdominal distention. Negative for abdominal pain, constipation, diarrhea, nausea and vomiting.  Genitourinary: Positive for scrotal swelling. Negative for dysuria.  Musculoskeletal: Positive for myalgias. Negative for arthralgias.  Skin: Negative for color change and rash.  Neurological: Negative for syncope and light-headedness.  All other systems reviewed and are negative.   Physical Exam Updated Vital Signs BP (!) 124/103 (BP Location: Left Arm)   Pulse 91   Temp 98 F (36.7 C)   Resp 14   SpO2 100%   Physical Exam Vitals and nursing note reviewed.  Constitutional:      General: He is not in acute distress.    Appearance: He is well-developed. He is ill-appearing. He is not diaphoretic.     Comments: Somewhat ill-appearing but in no distress  HENT:     Head: Normocephalic and atraumatic.     Mouth/Throat:     Mouth: Mucous membranes are moist.     Pharynx: Oropharynx is clear.  Eyes:     General:        Right eye: No discharge.        Left eye: No discharge.     Pupils: Pupils are equal, round, and reactive to light.  Cardiovascular:     Rate and Rhythm: Normal rate and regular rhythm.     Heart sounds: Normal heart sounds. No murmur. No friction rub. No gallop.   Pulmonary:     Effort: Pulmonary effort is normal. No respiratory distress.     Breath sounds: Normal  breath sounds. No wheezing or rales.  Abdominal:     General: Bowel sounds are normal.     Palpations: Abdomen is soft. There is no mass.     Tenderness: There is no abdominal tenderness. There is no guarding.     Comments: Abdominal wall edema noted, but abdomen is nontender, with normal bowel sounds.  Genitourinary:    Comments: Significant scrotal edema noted on exam, no erythema or warmth, nontender Musculoskeletal:        General: No deformity.     Cervical back: Neck supple.     Right lower leg: Edema present.     Left lower leg: Edema present.     Comments: Bilateral lower extremities with pitting edema throughout the entire leg, chronic appearing vascular wounds noted to the right lower leg.  Patient with a wound to the bottom of his left foot which he did not know about, wound is open but without purulent drainage.  Has had previous osteomyelitis in this foot.  Skin:    General: Skin is warm and dry.     Capillary Refill: Capillary refill takes less than 2 seconds.  Neurological:     Mental Status: He is alert.     Coordination: Coordination normal.     Comments: Speech is clear, able to follow commands Moves extremities without ataxia, coordination intact  Psychiatric:        Mood and Affect: Mood normal.        Behavior: Behavior normal.     ED Results / Procedures / Treatments   Labs (all labs ordered are listed, but only abnormal results are displayed) Labs Reviewed  COMPREHENSIVE METABOLIC PANEL - Abnormal; Notable for the following components:      Result Value   CO2 19 (*)    Glucose, Bld 103 (*)    BUN 95 (*)  Creatinine, Ser 4.81 (*)    Calcium 8.3 (*)    Albumin 2.8 (*)    Total Bilirubin 1.3 (*)    GFR calc non Af Amer 14 (*)    GFR calc Af Amer 16 (*)    All other components within normal limits  CBC - Abnormal; Notable for the following components:   RBC 3.35 (*)    Hemoglobin 10.2 (*)    HCT 33.0 (*)    RDW 16.4 (*)    All other components  within normal limits  URINALYSIS, ROUTINE W REFLEX MICROSCOPIC - Abnormal; Notable for the following components:   Hgb urine dipstick LARGE (*)    Protein, ur >300 (*)    Leukocytes,Ua TRACE (*)    All other components within normal limits  URINALYSIS, MICROSCOPIC (REFLEX) - Abnormal; Notable for the following components:   Bacteria, UA RARE (*)    All other components within normal limits  LIPASE, BLOOD    EKG None  Radiology DG Chest Port 1 View  Result Date: 10/15/2019 CLINICAL DATA:  Lower extremity edema EXAM: PORTABLE CHEST 1 VIEW COMPARISON:  05/22/2019 FINDINGS: Very low lung volumes limiting evaluation. Pulmonary vascular congestion with possible mild perihilar edema. No significant pleural effusion. No pneumothorax. Stable cardiomegaly. IMPRESSION: Pulmonary vascular congestion with possible mild edema. Electronically Signed   By: Macy Mis M.D.   On: 10/15/2019 09:11   DG Foot Complete Left  Result Date: 10/15/2019 CLINICAL DATA:  Nonhealing wound to left foot. EXAM: LEFT FOOT - COMPLETE 3+ VIEW COMPARISON:  August 29, 2018. FINDINGS: Vascular calcifications are noted. Continued chronic dislocations degenerative changes seen involving the tarsometatarsal joints. Stable defect of middle portion of second middle phalanx is noted. Diffuse soft tissue swelling is noted. No acute lytic destruction is seen to suggest osteomyelitis. IMPRESSION: Stable chronic findings as described above. No acute lytic destruction is seen to suggest osteomyelitis. Diffuse soft tissue swelling is noted. Electronically Signed   By: Marijo Conception M.D.   On: 10/15/2019 09:30    Procedures Procedures (including critical care time)  Medications Ordered in ED Medications  sodium chloride flush (NS) 0.9 % injection 3 mL (has no administration in time range)    ED Course  I have reviewed the triage vital signs and the nursing notes.  Pertinent labs & imaging results that were available during my  care of the patient were reviewed by me and considered in my medical decision making (see chart for details).    MDM Rules/Calculators/A&P                     44 year old male with known history of heart failure with EF of 20-25, who presents with worsening edema.  On exam patient with pitting edema throughout bilateral legs, scrotum and edema now extending into the abdominal wall.  Patient without abdominal tenderness.  Patient also with new oxygen requirement currently on 2 L, has never used home oxygen previously.  He has rales in bilateral lung bases.  Had a 2-week period where he was out of his torsemide, has since been back on it but he feels that he cannot catch up with worsening swelling.  Chronic vascular wounds to the right lower extremity, but with a new wound to the bottom of the left foot, history of previous osteomyelitis.  Will get basic labs, BNP, chest x-ray and x-ray of the left foot.  I have personally ordered, reviewed and interpreted all labs and imaging. CBC: No leukocytosis,  stable hemoglobin CMP: Creatinine is at baseline at 4.81 with no other significant electrolyte derangements, slightly elevated T bili but otherwise LFTs normal. Lipase: Ordered from triage but patient with abdominal wall edema but no abdominal pain on exam, WNL UA: Large amount of protein in hemoglobin noted but no signs of infection BNP: 1305 EKG: Mild sinus tachycardia with first-degree AV block, no ischemic changes Chest x-ray: pulmonary vascular congestion and mild edema, stable cardiomegaly Left foot x-ray: No signs of osteomyelitis, diffuse soft tissue swelling noted  Patient given 80 of IV Lasix.  Patient is followed by Dr. Haroldine Laws with the heart failure clinic, given significant edema with new oxygen requirement feel he will require admission for further diuresis, 80 of IV Lasix given here in the ED.  Will discuss with cardiology.  I do not think the patient will be a good candidate for heart  failure at home program.  Discussed with Dr. Aundra Dubin, cardiologist on for heart failure team who will plan to see patient in consult but request hospitalist admission.  Case discussed with Dr. Roosevelt Locks with [facility] hospitalist who will see and admit the patient.   Final Clinical Impression(s) / ED Diagnoses Final diagnoses:  Acute on chronic congestive heart failure, unspecified heart failure type [facility])  Peripheral edema    Rx / DC Orders ED Discharge Orders    None       Janet Berlin 10/15/19 1233    Dorie Rank, MD 10/16/19 [phone]

## 2019-10-15 NOTE — Consult Note (Addendum)
Advanced Heart Failure Team Consult Note   Primary Physician: [name], [name], MD PCP-HF Cardiologist: Dr Aundra Dubin  Reason for Consultation: A/C Heart Failure   HPI:    Joshua Massey is seen today for evaluation of heart failure at the request of Dr Tomi Bamberger.   Joshua Massey is a 44 year old with history of DM, HTN, and chronic systolic heart failure (diagnosed in 2010 EF 30-35%), CKD, super morbid obesity, poor compliance and poor health literacy/insight.  Has had several admits for A/C systolic heart failure. Most recent admit was 05/2019 secondary to running out of HF meds. Admitted with marked volume overload. Diuresed with lasix drip 12 mg per hour + metolazone. Diuresed 62 pounds. Creatinine 4.9. Discharged on 06/04/19 with weight 300 pounds.   He did not present for post hospital follow up. Had no show appointments 07/01/19, 08/01/19, and 09/19/19. He was discharged from HF Paramedicine 09/12/19 because he would not return phone calls.   Presented to [facility] via EMS with increased leg edema and abdominal distention. He ran out of his medications at least 4 weeks ago. Also has had increased drainage and odor from wounds on L foot and RLE. SCXR with pulmonary vascular congestion and mild edema. Had foot xray for chronic wound.  Pertinent labs included: BNP 1305, creatinine 4.8, K 4.6, Hgb 10.2   Given dose of IV lasix today. Remains SOB with exertion.   Echo 08/2018 EF 20-25% RV normal  Echo6/04/2018 15-20%, Grade 2 DD, Mild Joshua, Severe LAE, RV severely dilated and reduced function, Mod RAE, Mild TR, PA peak pressure 42 mm Hg. ECHO 07/2016 EF 25-30%.  ECHO 08/2012 EF 35-40% ECHO 02/15/12 EF 25%  Review of Systems: [y] = yes, [ ]  = no   . General: Weight gain [ Y]; Weight loss [ ] ; Anorexia [ ] ; Fatigue [ Y]; Fever [ ] ; Chills [ ] ; Weakness [ ]   . Cardiac: Chest pain/pressure [ ] ; Resting SOB [ Y]; Exertional SOB [Y ]; Orthopnea [Y ]; Pedal Edema [Y ]; Palpitations [ ] ; Syncope [ ] ; Presyncope  [ ] ; Paroxysmal nocturnal dyspnea[ ]   . Pulmonary: Cough [ ] ; Wheezing[ ] ; Hemoptysis[ ] ; Sputum [ ] ; Snoring [ ]   . GI: Vomiting[ ] ; Dysphagia[ ] ; Melena[ ] ; Hematochezia [ ] ; Heartburn[ ] ; Abdominal pain [ ] ; Constipation [ ] ; Diarrhea [ ] ; BRBPR [ ]   . GU: Hematuria[ ] ; Dysuria [ ] ; Nocturia[ ]   . Vascular: Pain in legs with walking [ ] ; Pain in feet with lying flat [ ] ; Non-healing sores [ ] ; Stroke [ ] ; TIA [ ] ; Slurred speech [ ] ;  . Neuro: Headaches[ ] ; Vertigo[ ] ; Seizures[ ] ; Paresthesias[ ] ;Blurred vision [ ] ; Diplopia [ ] ; Vision changes [ ]   . Ortho/Skin: Arthritis [ ] ; Joint pain [Y ]; Muscle pain [ ] ; Joint swelling [ ] ; Back Pain [ Y]; Rash [ ]   . Psych: Depression[ ] ; Anxiety[ ]   . Heme: Bleeding problems [ ] ; Clotting disorders [ ] ; Anemia [ ]   . Endocrine: Diabetes [ Y]; Thyroid dysfunction[ ]   Home Medications Prior to Admission medications   Medication Sig Start Date End Date Taking? Authorizing Provider  acetaminophen (TYLENOL) 500 MG tablet Take 1,000 mg by mouth every 6 (six) hours as needed for mild pain.   Yes [provider]  apixaban (ELIQUIS) 5 MG TABS tablet Take 1 tablet (5 mg total) by mouth 2 (two) times daily. 11/06/18  Yes [name], [name], MD  atorvastatin (LIPITOR) 10 MG  tablet Take 1 tablet (10 mg total) by mouth daily with supper. Patient taking differently: Take 10 mg by mouth every morning.  08/07/18  Yes [name], [name], MD  carvedilol (COREG) 25 MG tablet TAKE 1 TABLET BY MOUTH TWICE DAILY WITH  A  MEAL. Patient taking differently: Take 25 mg by mouth 2 (two) times daily with a meal.  06/17/19  Yes [name], [name], MD  Ferrous Sulfate (IRON) 325 (65 Fe) MG TABS TAKE 1 TABLET BY MOUTH TWICE DAILY WITH MEALS  Patient taking differently: Take 325 mg by mouth 2 (two) times daily with a meal.  07/12/19  Yes [name], [name], MD  gabapentin (NEURONTIN) 300 MG capsule Take 1 capsule (300 mg total) by mouth at bedtime. Patient taking differently: Take 300  mg by mouth at bedtime as needed (pain).  10/25/18  Yes Rayburn, Neta Mends, PA-C  insulin aspart (NOVOLOG FLEXPEN) 100 UNIT/ML FlexPen Inject 0-15 Units into the skin 3 (three) times daily with meals. 0-15U/Subcu/TIDw/meals//CBG70-120:0U/CBG121-150:2U/CBG151-200:3U/CBG201-250:5U/CBG251-300:8U/CBG301-350:11U/CBG351-400:15U//CBG>400:Call MD 11/14/18  Yes [name], [name], MD  Insulin Glargine (LANTUS) 100 UNIT/ML Solostar Pen Inject 5 Units into the skin at bedtime. Patient taking differently: Inject 1 Units into the skin at bedtime.  11/06/18  Yes [name], [name], MD  isosorbide-hydrALAZINE (BIDIL) 20-37.5 MG tablet Take 1 tablet by mouth 2 (two) times daily. 07/24/19  Yes Larey Dresser, MD  Multiple Vitamin (MULTIVITAMIN WITH MINERALS) TABS tablet Take 1 tablet by mouth daily. 09/07/18  Yes Bonnielee Haff, MD  silver sulfADIAZINE (SILVADENE) 1 % cream Apply topically daily. Silvadene plus dry dressing to plantar ulcer left foot.  Change daily. Patient taking differently: Apply 1 application topically daily as needed (skin care). Silvadene plus dry dressing to plantar ulcer left foot.  Change daily. 09/07/18  Yes Bonnielee Haff, MD  torsemide (DEMADEX) 100 MG tablet Take 1 tablet (100 mg total) by mouth daily. 07/24/19 10/15/19 Yes Larey Dresser, MD  Vitamin D, Ergocalciferol, (DRISDOL) 1.25 MG (50000 UT) CAPS capsule Take 1 capsule (50,000 Units total) by mouth every 7 (seven) days. Patient taking differently: Take 50,000 Units by mouth every 7 (seven) days. Friday 11/06/18  Yes [name], [name], MD  amiodarone (PACERONE) 200 MG tablet Take 1 tablet (200 mg total) by mouth daily. 06/04/19 07/04/19  Darliss Cheney, MD  Continuous Blood Gluc Receiver (FREESTYLE LIBRE 14 DAY READER) DEVI 1 Device by Does not apply route daily. 11/06/18   [name], [name], MD  Continuous Blood Gluc Sensor (FREESTYLE LIBRE 14 DAY SENSOR) MISC Inject 1 Device into the skin 4 (four) times daily as needed. 11/06/18   [name], Betty  G, MD  glucose blood (FREESTYLE TEST STRIPS) test strip Use as instructed 09/02/16   Ghimire, Henreitta Leber, MD  glucose monitoring kit (FREESTYLE) monitoring kit 1 each by Does not apply route 4 (four) times daily - after meals and at bedtime. 1 month Diabetic Testing Supplies for QAC-QHS accuchecks. 09/02/16   Ghimire, Henreitta Leber, MD  Insulin Pen Needle 32G X 8 MM MISC Use as directed 09/02/16   Jonetta Osgood, MD  Lancets (FREESTYLE) lancets Use as instructed 09/02/16   Ghimire, Henreitta Leber, MD  oxyCODONE-acetaminophen (PERCOCET/ROXICET) 5-325 MG tablet Take 1 tablet by mouth every 8 (eight) hours as needed for moderate pain or severe pain. Patient not taking: Reported on 10/15/2019 10/25/18   Rayburn, Neta Mends, PA-C    Past Medical History: Past Medical History:  Diagnosis Date  . CHF (congestive heart failure) ([place])  nonischemic cardiopathy  . Chronic kidney disease   . Diabetes mellitus    adult-onset, type 2  . Exogenous obesity   . GERD (gastroesophageal reflux disease)   . Hypertension   . Left foot infection 06/21/2018  . Osteomyelitis of foot, left, acute ([place]) 07/04/2018    Past Surgical History: Past Surgical History:  Procedure Laterality Date  . ANKLE SURGERY  2003   plate and screws  . APPLICATION OF WOUND VAC Left 06/12/2018   Procedure: APPLICATION OF WOUND VAC;  Surgeon: Mcarthur Rossetti, MD;  Location: [place];  Service: Orthopedics;  Laterality: Left;  . CARDIAC CATHETERIZATION  02/16/2012   50% LAD lesion in mid vessel, otherwise no significant obstructive CAD  . HIP PINNING  1990   bilateral  . I & D EXTREMITY Left 06/10/2018   Procedure: IRRIGATION AND DEBRIDEMENT OF FOOT;  Surgeon: Mcarthur Rossetti, MD;  Location: [place];  Service: Orthopedics;  Laterality: Left;  . I & D EXTREMITY Left 06/12/2018   Procedure: REPEAT IRRIGATION AND DEBRIDEMENT LEFT FOOT;  Surgeon: Mcarthur Rossetti, MD;  Location: [place];  Service: Orthopedics;  Laterality:  Left;  . I & D EXTREMITY Left 07/04/2018   Procedure: DEBRIDEMENT OF LEFT FOOT;  Surgeon: Newt Minion, MD;  Location: [place];  Service: Orthopedics;  Laterality: Left;  . IR GENERIC HISTORICAL  09/01/2016   IR FLUORO GUIDE CV LINE RIGHT 09/01/2016 Marybelle Killings, MD [facility]  . IR GENERIC HISTORICAL  09/01/2016   IR US GUIDE VASC ACCESS RIGHT 09/01/2016 Marybelle Killings, MD [facility]  . IR GENERIC HISTORICAL  09/16/2016   IR REMOVAL TUN CV CATH W/O FL 09/16/2016 Arne Cleveland, MD [facility]  . KNEE ARTHROSCOPY Left 08/30/2016   Procedure: ARTHROSCOPY KNEE;  Surgeon: Renette Butters, MD;  Location: [place];  Service: Orthopedics;  Laterality: Left;  . LEFT AND RIGHT HEART CATHETERIZATION WITH CORONARY ANGIOGRAM N/A 02/16/2012   Procedure: LEFT AND RIGHT HEART CATHETERIZATION WITH CORONARY ANGIOGRAM;  Surgeon: Pixie Casino, MD;  Location: [facility] CATH LAB;  Service: Cardiovascular;  Laterality: N/A;  . TRANSTHORACIC ECHOCARDIOGRAM  08/31/2012   EF 35-40%, no significant wall motion abnormalities, diastolic relaxation abnormality - no longer needed LifeVest; LV cavity size mod dilated with mod conc hypertrophy, systolic function mod reduced; mild Joshua, LA mod dilated    Family History: Family History  Problem Relation Age of Onset  . Coronary artery disease Brother        CABG at 66, DM  . Heart disease Brother 75       CAD  . Diabetes Mother   . Hypertension Mother   . Hyperlipidemia Mother   . Heart disease Mother   . Diabetes Father   . Hypertension Father     Social History: Social History   Socioeconomic History  . Marital status: Single    Spouse name: Not on file  . Number of children: 6  . Years of education: 40  . Highest education level: Not on file  Occupational History  . Occupation: Brewing technologist    Comment: [name]  . Occupation: newpaper delivery    Employer: [employer]  Tobacco Use  . Smoking status: Never Smoker  . Smokeless tobacco: Never  Used  Substance and Sexual Activity  . Alcohol use: No  . Drug use: No  . Sexual activity: Yes    Birth control/protection: Condom  Other Topics Concern  . Not on file  Social History Narrative  . Not on file   Social Determinants of Health   Financial Resource Strain:   . Difficulty of Paying Living Expenses:   Food Insecurity:   . Worried About Charity fundraiser in the Last Year:   . Arboriculturist in the Last Year:   Transportation Needs:   . Film/video editor (Medical):   Marland Kitchen Lack of Transportation (Non-Medical):   Physical Activity:   . Days of Exercise per Week:   . Minutes of Exercise per Session:   Stress:   . Feeling of Stress :   Social Connections:   . Frequency of Communication with Friends and Family:   . Frequency of Social Gatherings with Friends and Family:   . Attends Religious Services:   . Active Member of Clubs or Organizations:   . Attends Archivist Meetings:   Marland Kitchen Marital Status:     Allergies:  No Known Allergies  Objective:    Vital Signs:   Temp:  [98 F (36.7 C)-99.1 F (37.3 C)] 98.9 F (37.2 C) (04/27 1130) Pulse Rate:  [80-101] 90 (04/27 1145) Resp:  [14-22] 14 (04/27 1145) BP: (124-144)/(92-103) 144/99 (04/27 1145) SpO2:  [91 %-100 %] 97 % (04/27 1145) Weight:  [136 kg] 136 kg (04/27 1143)    Weight change: Filed Weights   10/15/19 1143  Weight: 136 kg    Intake/Output:   Intake/Output Summary (Last 24 hours) at 10/15/2019 1216 Last data filed at 10/15/2019 1145 Gross per 24 hour  Intake --  Output 400 ml  Net -400 ml      Physical Exam    General:  . No resp difficulty HEENT: normal Neck: supple. JVP to jaw  . Carotids 2+ bilat; no bruits. No lymphadenopathy or thyromegaly appreciated. Cor: PMI nondisplaced. Regular rate & rhythm. No rubs, gallops or murmurs. Lungs: clear Abdomen: obese, 2+ abdominal edema, firm, nontender, distended. No hepatosplenomegaly. No bruits or masses. Good bowel  sounds. Extremities: no cyanosis, clubbing, rash, R and LLE 2-3+  Edema. RLE with multiple open wounds with copious exudate. L Foot Plantar wound Neuro: alert & orientedx3, cranial nerves grossly intact. moves all 4 extremities w/o difficulty. Affect pleasant   Telemetry   SR 80-90s   EKG    SR 91 bpm  Labs   Basic Metabolic Panel: Recent Labs  Lab 09/29/2019 2206  NA 141  K 4.6  CL 108  CO2 19*  GLUCOSE 103*  BUN 95*  CREATININE 4.81*  CALCIUM 8.3*    Liver Function Tests: Recent Labs  Lab 09/30/2019 2206  AST 37  ALT 17  ALKPHOS 90  BILITOT 1.3*  PROT 7.3  ALBUMIN 2.8*   Recent Labs  Lab 10/04/2019 2206  LIPASE 24   No results for input(s): AMMONIA in the last 168 hours.  CBC: Recent Labs  Lab 10/05/2019 2206  WBC 4.5  HGB 10.2*  HCT 33.0*  MCV 98.5  PLT 175    Cardiac Enzymes: No results for input(s): CKTOTAL, CKMB, CKMBINDEX, TROPONINI in the last 168 hours.  BNP: BNP (last 3 results) Recent Labs    05/25/19 0235 05/26/19 0310 05/27/19 0242  BNP 1,682.3* 1,444.0* 1,403.3*    ProBNP (last 3 results) No results for input(s): PROBNP in the last 8760 hours.   CBG: No results for input(s): GLUCAP in the last 168 hours.  Coagulation Studies: No results for input(s): LABPROT, INR in the last 72 hours.   Imaging   DG Chest  Port 1 View  Result Date: 10/15/2019 CLINICAL DATA:  Lower extremity edema EXAM: PORTABLE CHEST 1 VIEW COMPARISON:  05/22/2019 FINDINGS: Very low lung volumes limiting evaluation. Pulmonary vascular congestion with possible mild perihilar edema. No significant pleural effusion. No pneumothorax. Stable cardiomegaly. IMPRESSION: Pulmonary vascular congestion with possible mild edema. Electronically Signed   By: Macy Mis M.D.   On: 10/15/2019 09:11   DG Foot Complete Left  Result Date: 10/15/2019 CLINICAL DATA:  Nonhealing wound to left foot. EXAM: LEFT FOOT - COMPLETE 3+ VIEW COMPARISON:  August 29, 2018. FINDINGS:  Vascular calcifications are noted. Continued chronic dislocations degenerative changes seen involving the tarsometatarsal joints. Stable defect of middle portion of second middle phalanx is noted. Diffuse soft tissue swelling is noted. No acute lytic destruction is seen to suggest osteomyelitis. IMPRESSION: Stable chronic findings as described above. No acute lytic destruction is seen to suggest osteomyelitis. Diffuse soft tissue swelling is noted. Electronically Signed   By: Marijo Conception M.D.   On: 10/15/2019 09:30      Medications:     Current Medications:    Infusions:       Assessment/Plan   1. A/C Systolic Heart Failure  ECHO 08/2018 EF 20-25% and normal RV. EF has been down since 2013.  Suspected NICM. [place] 2013 with nonobstructive cad.  Acute decompensation in the setting of no medications for the last month. He never returned for HF follow up since his last admit 05/2019 - CXR with pulmonary congestion - Marked volume overload. Given IV lasix this morning. Start lasix drip at 12 mg per hour.  - No spiro , dig, arni with creatinine 4.8   2. L foot, plantar wound  - Moderate exudate + odor - WBC is not elevated.  -Xray of foot - No suggestive of osteo.  Will need WOC  3. RLE Multiple open wounds  Moderate serous exudate/+ odor  - Hopefully this will improve with diuresis and wound care.   4.  CKD Stage IV  Creatinine on admit 4.8  He has not had nephrology follow up.   5. PAF -In NSR today .  -Restart amio 200 mg daily - Start eliquis 5 mg twice a day   6. DMII Non compliant witth meds. Hgb A1C pending.   7. Poor Literacy  He is not a candidate for advanced therapies with CKD Stage IV and ongoing noncompliance.  He was discharged from HF Paramedicine 08/2019 because he wound not answer the phone for follow up.   Length of Stay: 0  Darrick Grinder, NP  10/15/2019, 12:16 PM  Advanced Heart Failure Team Pager [phone] (M-F; 7a - 4p)  Please contact [facility] for night-coverage after hours (4p -7a ) and weekends on [email or web address]  Patient seen with NP, agree with the above note.   Patient came to the ER with dyspnea, found to be hypoxemic with pulmonary edema on CXR. He has also had increased drainage from foot lesions.   Creatinine 4.8, near his baseline.    He reports that he has been out of his meds for an uncertain amount of time.    He is in NSR today.   General: NAD, obese.  Neck:JVP 14-16 cm, no thyromegaly or thyroid nodule.  Lungs: Crackles at bases.  CV: Nonpalpable PMI.  Heart regular S1/S2, no S3/S4, no murmur. 2+ chronic edema to thighs.  No carotid bruit.  Unable to palpate pedal pulses.  Abdomen: Soft, nontender, no hepatosplenomegaly, no distention.  Skin: Intact without  lesions or rashes.  Neurologic: Alert and oriented x 3.  Psych: Normal affect. Extremities: Wounds on feet.  HEENT: Normal.   1. Acute on chronic systolic CHF: Nonischemic cardiomyopathy (long-standing, cath in 2013 with 50% mLAD stenosis).  Last echo in 3/20 with EF 20-25%.  Complicated by CKD stage 4.  He has been noncompliant with meds and followup, out of meds for uncertain period.  NYHA class IV symptoms.  Marked volume overload.  - Bidil 1 tab tid.  - Coreg 25 mg bid - Lasix gtt 12 mg/hr and follow response.  - Echo 2. CKD: stage IV.  Creatinine 4.8 today, this is around his baseline.  I am concerned that he is going to progress to ESRD soon.  - Diuresis as above. 3. LE wounds: Draining wounds on feet. Foot XR not suggestive of osteomyelitis.  - Wound consult.  - Lower extremity dopplers assess PAD.  4. Atrial fibrillation: Paroxysmal.  He is in NSR.   - Continue amiodarone 200 daily.  - apixaban 5 mg bid.   Loralie Champagne 10/15/2019 2:07 PM

## 2019-10-15 NOTE — ED Notes (Signed)
Pt transported to MRI 

## 2019-10-15 NOTE — Progress Notes (Signed)
  Echocardiogram 2D Echocardiogram has been performed.  Joshua Massey 10/15/2019, 3:35 PM

## 2019-10-16 DIAGNOSIS — E11610 Type 2 diabetes mellitus with diabetic neuropathic arthropathy: Secondary | ICD-10-CM | POA: Diagnosis present

## 2019-10-16 DIAGNOSIS — L97521 Non-pressure chronic ulcer of other part of left foot limited to breakdown of skin: Secondary | ICD-10-CM

## 2019-10-16 DIAGNOSIS — I509 Heart failure, unspecified: Secondary | ICD-10-CM | POA: Diagnosis not present

## 2019-10-16 DIAGNOSIS — R609 Edema, unspecified: Secondary | ICD-10-CM | POA: Diagnosis not present

## 2019-10-16 LAB — FERRITIN: Ferritin: 415 ng/mL — ABNORMAL HIGH (ref 24–336)

## 2019-10-16 LAB — CBC
HCT: 31.4 % — ABNORMAL LOW (ref 39.0–52.0)
Hemoglobin: 9.7 g/dL — ABNORMAL LOW (ref 13.0–17.0)
MCH: 30.2 pg (ref 26.0–34.0)
MCHC: 30.9 g/dL (ref 30.0–36.0)
MCV: 97.8 fL (ref 80.0–100.0)
Platelets: 152 10*3/uL (ref 150–400)
RBC: 3.21 MIL/uL — ABNORMAL LOW (ref 4.22–5.81)
RDW: 16.5 % — ABNORMAL HIGH (ref 11.5–15.5)
WBC: 4.5 10*3/uL (ref 4.0–10.5)
nRBC: 0 % (ref 0.0–0.2)

## 2019-10-16 LAB — BASIC METABOLIC PANEL
Anion gap: 15 (ref 5–15)
BUN: 100 mg/dL — ABNORMAL HIGH (ref 6–20)
CO2: 18 mmol/L — ABNORMAL LOW (ref 22–32)
Calcium: 8.1 mg/dL — ABNORMAL LOW (ref 8.9–10.3)
Chloride: 108 mmol/L (ref 98–111)
Creatinine, Ser: 5.13 mg/dL — ABNORMAL HIGH (ref 0.61–1.24)
GFR calc Af Amer: 15 mL/min — ABNORMAL LOW (ref >60)
GFR calc non Af Amer: 13 mL/min — ABNORMAL LOW (ref >60)
Glucose, Bld: 118 mg/dL — ABNORMAL HIGH (ref 70–99)
Potassium: 4.7 mmol/L (ref 3.5–5.1)
Sodium: 141 mmol/L (ref 135–145)

## 2019-10-16 LAB — PHOSPHORUS: Phosphorus: 6.5 mg/dL — ABNORMAL HIGH (ref 2.5–4.6)

## 2019-10-16 LAB — APTT: aPTT: 90 seconds — ABNORMAL HIGH (ref 24–36)

## 2019-10-16 LAB — HEPARIN LEVEL (UNFRACTIONATED): Heparin Unfractionated: >2.2 IU/mL — ABNORMAL HIGH (ref 0.30–0.70)

## 2019-10-16 LAB — CBG MONITORING, ED
Glucose-Capillary: 106 mg/dL — ABNORMAL HIGH (ref 70–99)
Glucose-Capillary: 96 mg/dL (ref 70–99)

## 2019-10-16 LAB — C-REACTIVE PROTEIN: CRP: 5.2 mg/dL — ABNORMAL HIGH (ref <1.0)

## 2019-10-16 LAB — ABO/RH: ABO/RH(D): O POS

## 2019-10-16 LAB — MRSA PCR SCREENING: MRSA by PCR: NEGATIVE

## 2019-10-16 LAB — D-DIMER, QUANTITATIVE: D-Dimer, Quant: 13.6 ug/mL-FEU — ABNORMAL HIGH (ref 0.00–0.50)

## 2019-10-16 LAB — PROCALCITONIN: Procalcitonin: 0.68 ng/mL

## 2019-10-16 LAB — TYPE AND SCREEN
ABO/RH(D): O POS
Antibody Screen: NEGATIVE

## 2019-10-16 LAB — GLUCOSE, CAPILLARY
Glucose-Capillary: 118 mg/dL — ABNORMAL HIGH (ref 70–99)
Glucose-Capillary: 108 mg/dL — ABNORMAL HIGH (ref 70–99)

## 2019-10-16 LAB — MAGNESIUM: Magnesium: 2 mg/dL (ref 1.7–2.4)

## 2019-10-16 MED ORDER — PANTOPRAZOLE SODIUM 40 MG PO TBEC
40.0000 mg | DELAYED_RELEASE_TABLET | Freq: Every day | ORAL | Status: DC
  Administered 2019-10-16 – 2019-10-22 (×7): 40 mg via ORAL
  Filled 2019-10-16 (×6): qty 1

## 2019-10-16 MED ORDER — METOLAZONE 2.5 MG PO TABS
2.5000 mg | ORAL_TABLET | Freq: Once | ORAL | Status: AC
  Administered 2019-10-16: 2.5 mg via ORAL
  Filled 2019-10-16: qty 1

## 2019-10-16 MED ORDER — HEPARIN (PORCINE) 25000 UT/250ML-% IV SOLN
1400.0000 [IU]/h | INTRAVENOUS | Status: DC
  Administered 2019-10-16: 1400 [IU]/h via INTRAVENOUS
  Filled 2019-10-16: qty 250

## 2019-10-16 MED ORDER — SPIRONOLACTONE 25 MG PO TABS: 25.0000 mg | ORAL_TABLET | Freq: Every day | ORAL | Status: DC

## 2019-10-16 NOTE — Progress Notes (Signed)
PROGRESS NOTE                                                                                                                                                                                                             Patient Demographics:    Joshua Massey, is a 44 y.o. male, DOB - 02-Apr-1976, RMB:[id number]  Outpatient Primary MD for the patient is [name], [name], MD    LOS - 1  Admit date - 10/01/2019    Chief Complaint  Patient presents with  . Leg Swelling  . Abdominal Pain       Brief Narrative  - Joshua Massey is a 44 y.o. male with medical history significant of IDDM, HTN, and chronic systolic heart failure EF 20-25%,CKD stage IV, morbid obesity, poor compliance with medications, presented with increasing redness and increasing short of breath for 3 weeks, he ran out of his diuretics few weeks ago.  In the ER he was diagnosed with acute on chronic combined systolic diastolic heart failure with massive fluid overload, AKI on CKD 4, incidental Covid infection and admitted to the hospital.   Subjective:    Joshua Massey today has, No headache, No chest pain, No abdominal pain - No Nausea, No new weakness tingling or numbness, mild orthopnea and SOB.    Assessment  & Plan :      1. Acute Hypoxic Resp. Failure due to severe acute on chronic combined systolic and diastolic heart failure with EF of 25%.  He has currently been placed on Lasix drip, he has massive fluid overload, cardiology following, been placed on salt and fluid restriction, monitor intake output and renal function along with electrolytes.  He is at least 25 to 30 L positive.  Continue combination of amiodarone, Coreg, BiDil and diuretics.  Renal function precludes the use of ACE/ARB or Entresto.   2. Acute Covid 19 Viral Pneumonitis during the ongoing 2020 Covid 19 Pandemic -this seems to be incidental finding will monitor.  Encouraged the patient  to sit up in chair in the daytime use I-S and flutter valve for pulmonary toiletry and then prone in bed when at night.  Will advance activity and titrate down oxygen as possible.     SpO2: 96 % O2 Flow Rate (L/min): 2 L/min  Recent Labs  Lab 10/15/19 1107 10/15/19 1201 10/15/19  1702 10/16/19 0406 10/16/19 0733  CRP  --   --  5.1* 5.2*  --   DDIMER  --   --  15.09* 13.60*  --   BNP 1,305.3*  --   --   --   --   PROCALCITON  --   --  0.61  --  0.68  SARSCOV2NAA  --  POSITIVE*  --   --   --     Hepatic Function Latest Ref Rng & Units 09/25/2019 06/04/2019 06/03/2019  Total Protein 6.5 - 8.1 g/dL 7.3 - -  Albumin 3.5 - 5.0 g/dL 2.8(L) 2.9(L) 2.8(L)  AST 15 - 41 U/L 37 - -  ALT 0 - 44 U/L 17 - -  Alk Phosphatase 38 - 126 U/L 90 - -  Total Bilirubin 0.3 - 1.2 mg/dL 1.3(H) - -  Bilirubin, Direct 0.0 - 0.2 mg/dL - - -    3.  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  No signs of osteomyelitis on MRI, no signs of cellulitis, orthopedics Dr. Sharol Given following.  Will monitor.  He will be high risk candidate for surgery right now as his CHF is decompensated and he is in massive fluid overload.  4.  AKI on CKD 4.  Baseline creatinine close to 4.7.  Diurese and monitor.  5.  Dyslipidemia.  Continue statin.  6.  Essential hypertension.  Continue home dose Coreg and BiDil combination along with diuretics for now.  Monitor and adjust.   7.  Paroxysmal atrial fibrillation [place] vas 2 score of greater than 3.  For now on amiodarone and beta-blocker for rate control, heparin drip.  Will monitor.  Cardiology on board.   8.  Anemia of chronic disease.  No signs of active bleeding, anemia panel, oral iron supplement, PPI and monitor.    Condition - Fair  Family Communication  :  None  Code Status :  Full  Diet :   Diet Order            Diet renal with fluid restriction Fluid restriction: 1200 mL Fluid; Room service appropriate? Yes; Fluid consistency: Thin  Diet effective now                Disposition Plan  : Stay in the hospital for severe acute on chronic combined systolic and diastolic CHF, AKI on CKD 4, COVID-19 infection.  Consults  :  Cards, Ortho  Procedures  :    MRI left foot.  No osteomyelitis.  Charcot joints.  TTE.  1. Severe global reduction in LV systolic function; restrictive filling; 4 chamber enlargement; severe RV dysfunction; mild MR; moderate TR; moderate pulmonary hypertension; small pericardial effusion. 2. Left ventricular ejection fraction, by estimation, is 20 to 25%. The left ventricle has severely decreased function. The left ventricle demonstrates global hypokinesis. The left ventricular internal cavity size was moderately dilated. There is mild left ventricular hypertrophy. Left ventricular diastolic parameters are consistent with Grade III diastolic dysfunction (restrictive).  3. Right ventricular systolic function is severely reduced. The right ventricular size is mildly enlarged. There is moderately elevated pulmonary artery systolic pressure.  4. Left atrial size was mildly dilated.  5. Right atrial size was mildly dilated.  6. The mitral valve is normal in structure. Mild mitral valve regurgitation. No evidence of mitral stenosis.  7. Tricuspid valve regurgitation is moderate.  8. The aortic valve has an indeterminant number of cusps. Aortic valve regurgitation is not visualized. No aortic stenosis is present.  9. The inferior  vena cava is dilated in size with <50% respiratory variability, suggesting right atrial pressure of 15 mmHg.  PUD Prophylaxis :   DVT Prophylaxis  :   Heparin  Lab Results  Component Value Date   PLT 152 10/16/2019    Inpatient Medications  Scheduled Meds: . amiodarone  200 mg Oral Daily  . atorvastatin  10 mg Oral Q supper  . carvedilol  25 mg Oral BID WC  . ferrous sulfate  325 mg Oral BID WC  . heparin injection (subcutaneous)  5,000 Units Subcutaneous Q8H  . insulin aspart  0-9 Units  Subcutaneous TID WC  . isosorbide-hydrALAZINE  1 tablet Oral BID  . multivitamin with minerals  1 tablet Oral Daily  . sodium chloride flush  3 mL Intravenous Q12H   Continuous Infusions: . furosemide (LASIX) infusion 12 mg/hr (10/15/19 1520)   PRN Meds:.acetaminophen, gabapentin, LORazepam, ondansetron (ZOFRAN) IV  Antibiotics  :    Anti-infectives (From admission, onward)   None       Time Spent in minutes  30   Lala Lund M.D on 10/16/2019 at 11:59 AM  To page go to [email or web address] - password [email or web address]  [facility] -  Office  [phone]   See all Orders from today for further details    Objective:   Vitals:   10/16/19 0700 10/16/19 0730 10/16/19 0800 10/16/19 0830  BP: 134/78 109/64 104/66 107/71  Pulse: 74 73 73 75  Resp: 17     Temp:      TempSrc:      SpO2: 95% 96% 96% 96%  Weight:      Height:        Wt Readings from Last 3 Encounters:  10/15/19 136 kg  06/04/19 (!) 136.4 kg  12/10/18 (!) 142.9 kg    No intake or output data in the 24 hours ending 10/16/19 1159   Physical Exam  Awake Alert, No new F.N deficits, Normal affect [place].AT,PERRAL Supple Neck,No JVD, No cervical lymphadenopathy appriciated.  Symmetrical Chest wall movement, Good air movement bilaterally, CTAB RRR,No Gallops,Rubs or new Murmurs, No Parasternal Heave +ve B.Sounds, Abd Soft, No tenderness, No organomegaly appriciated, No rebound - guarding or rigidity. No Cyanosis, 3 + edema in the scrotum and lower extremities, left foot plantar ulcer noted, no pus or surrounding infection.    Data Review:    CBC Recent Labs  Lab 10/17/2019 2206 10/16/19 0406  WBC 4.5 4.5  HGB 10.2* 9.7*  HCT 33.0* 31.4*  PLT 175 152  MCV 98.5 97.8  MCH 30.4 30.2  MCHC 30.9 30.9  RDW 16.4* 16.5*    Chemistries  Recent Labs  Lab 10/04/2019 2206 10/15/19 1702 10/16/19 0406  NA 141  --  141  K 4.6  --  4.7  CL 108  --  108  CO2 19*  --  18*  GLUCOSE 103*  --  118*  BUN 95*  --   100*  CREATININE 4.81*  --  5.13*  CALCIUM 8.3*  --  8.1*  AST 37  --   --   ALT 17  --   --   ALKPHOS 90  --   --   BILITOT 1.3*  --   --   MG  --   --  2.0  HGBA1C  --  6.3*  --      ------------------------------------------------------------------------------------------------------------------ No results for input(s): CHOL, HDL, LDLCALC, TRIG, CHOLHDL, LDLDIRECT in the last 72 hours.  Lab Results  Component Value Date  HGBA1C 6.3 (H) 10/15/2019   ------------------------------------------------------------------------------------------------------------------ No results for input(s): TSH, T4TOTAL, T3FREE, THYROIDAB in the last 72 hours.  Invalid input(s): FREET3  Cardiac Enzymes No results for input(s): CKMB, TROPONINI, MYOGLOBIN in the last 168 hours.  Invalid input(s): CK ------------------------------------------------------------------------------------------------------------------    Component Value Date/Time   BNP 1,305.3 (H) 10/15/2019 1107   BNP 15.0 01/01/2013 1212    Micro Results Recent Results (from the past 240 hour(s))  Respiratory Panel by RT PCR (Flu A&B, Covid) - Nasopharyngeal Swab     Status: Abnormal   Collection Time: 10/15/19 12:01 PM   Specimen: Nasopharyngeal Swab  Result Value Ref Range Status   SARS Coronavirus 2 by RT PCR POSITIVE (A) NEGATIVE Final    Comment: RESULT CALLED TO, READ BACK BY AND VERIFIED WITH: Benard Halsted RN 14:30 10/15/19 ([email or web address]) (NOTE) SARS-CoV-2 target nucleic acids are DETECTED. SARS-CoV-2 RNA is generally detectable in upper respiratory specimens  during the acute phase of infection. Positive results are indicative of the presence of the identified virus, but do not rule out bacterial infection or co-infection with other pathogens not detected by the test. Clinical correlation with patient history and other diagnostic information is necessary to determine patient infection status. The expected result is  Negative. Fact Sheet for Patients:  PinkCheek.be Fact Sheet for Healthcare Providers: GravelBags.it This test is not yet approved or cleared by the [place] FDA and  has been authorized for detection and/or diagnosis of SARS-CoV-2 by FDA under an Emergency Use Authorization (EUA).  This EUA will remain in effect (meaning this test can be used) f or the duration of  the COVID-19 declaration under Section 564(b)(1) of the Act, 21 U.S.C. section 360bbb-3(b)(1), unless the authorization is terminated or revoked sooner.    Influenza A by PCR NEGATIVE NEGATIVE Final   Influenza B by PCR NEGATIVE NEGATIVE Final    Comment: (NOTE) The Xpert Xpress SARS-CoV-2/FLU/RSV assay is intended as an aid in  the diagnosis of influenza from Nasopharyngeal swab specimens and  should not be used as a sole basis for treatment. Nasal washings and  aspirates are unacceptable for Xpert Xpress SARS-CoV-2/FLU/RSV  testing. Fact Sheet for Patients: PinkCheek.be Fact Sheet for Healthcare Providers: GravelBags.it This test is not yet approved or cleared by the [place] FDA and  has been authorized for detection and/or diagnosis of SARS-CoV-2 by  FDA under an Emergency Use Authorization (EUA). This EUA will remain  in effect (meaning this test can be used) for the duration of the  Covid-19 declaration under Section 564(b)(1) of the Act, 21  U.S.C. section 360bbb-3(b)(1), unless the authorization is  terminated or revoked. Performed at [facility], [place] [address], [place], [place] [address]     Radiology Reports MR FOOT LEFT WO CONTRAST  Result Date: 10/15/2019 CLINICAL DATA:  Bilateral lower extremity swelling, question of osteomyelitis ulcer on the bottom of the left forefoot EXAM: MRI OF THE LEFT FOOT WITHOUT CONTRAST TECHNIQUE: Multiplanar, multisequence MR imaging of the left  was performed. No intravenous contrast was administered. COMPARISON:  Multiple radiographs, most recent radiograph same day FINDINGS: Bones/Joint/Cartilage Again noted are chronic erosive type changes seen at the base of the first MTP joint there is stable dorsal on lateral subluxation of the metatarsals at the TMT joint joints with erosive type changes and chronic disruption of the Lisfranc ligaments. There is small erosive type changes seen throughout the cuneiform. There is no acute marrow signal abnormality seen throughout the osseous structures however. No large  joint effusions are present. Probable healed fracture deformity of the fifth metatarsal base seen. There is diffuse chronic periosteal reaction seen through the second through fourth metatarsal shafts. Ligaments Chronic disruptions of the Lisfranc ligaments as described above. Muscles and Tendons Diffuse fatty atrophy with denervation seen throughout the forefoot. The flexor and extensor tendons appear to be grossly intact, however there is surrounding fluid signal on surrounding the flexor digitorum tendons. Soft tissues On the medial plantar forefoot there is areas of superficial ulceration with diffuse skin thickening and subcutaneous edema seen. No loculated fluid collections or sinus tract however are noted. There is diffuse dorsal subcutaneous edema seen on the dorsum of the forefoot. IMPRESSION: 1. 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 No definite evidence acute osteomyelitis. 2. Area superficial ulceration on the plantar base beneath the first metatarsal. No definite loculated fluid collection or sinus tract. 3. Diffuse subcutaneous edema and cellulitis surrounding the forefoot. Electronically Signed   By: Prudencio Pair M.D.   On: 10/15/2019 22:27   DG Chest Port 1 View  Result Date: 10/15/2019 CLINICAL DATA:  Lower extremity edema EXAM: PORTABLE CHEST 1 VIEW  COMPARISON:  05/22/2019 FINDINGS: Very low lung volumes limiting evaluation. Pulmonary vascular congestion with possible mild perihilar edema. No significant pleural effusion. No pneumothorax. Stable cardiomegaly. IMPRESSION: Pulmonary vascular congestion with possible mild edema. Electronically Signed   By: Macy Mis M.D.   On: 10/15/2019 09:11   DG Foot Complete Left  Result Date: 10/15/2019 CLINICAL DATA:  Nonhealing wound to left foot. EXAM: LEFT FOOT - COMPLETE 3+ VIEW COMPARISON:  August 29, 2018. FINDINGS: Vascular calcifications are noted. Continued chronic dislocations degenerative changes seen involving the tarsometatarsal joints. Stable defect of middle portion of second middle phalanx is noted. Diffuse soft tissue swelling is noted. No acute lytic destruction is seen to suggest osteomyelitis. IMPRESSION: Stable chronic findings as described above. No acute lytic destruction is seen to suggest osteomyelitis. Diffuse soft tissue swelling is noted. Electronically Signed   By: Marijo Conception M.D.   On: 10/15/2019 09:30   ECHOCARDIOGRAM COMPLETE  Result Date: 10/15/2019    ECHOCARDIOGRAM REPORT   Patient Name:   ODAI WIMMER Date of Exam: 10/15/2019 Medical Rec #:  [id number]        Height:       71.0 in Accession #:    [id number]       Weight:       299.8 lb Date of Birth:  04/22/1976        BSA:          2.504 m Patient Age:    62 years         BP:           144/99 mmHg Patient Gender: M                HR:           92 bpm. Exam Location:  Inpatient Procedure: 2D Echo, Cardiac Doppler and Color Doppler Indications:    CHF-Acute Systolic 638.93/T34.28  History:        Patient has prior history of Echocardiogram examinations. CHF,                 CAD; Risk Factors:Dyslipidemia. Non-ischemi cardiomyopathy. CKD.  Sonographer:    Clayton Lefort RDCS (AE) Referring Phys: [id number] Lequita Halt  Sonographer Comments: Patient is morbidly obese. IMPRESSIONS  1. Severe global reduction in LV  systolic  function; restrictive filling; 4 chamber enlargement; severe RV dysfunction; mild MR; moderate TR; moderate pulmonary hypertension; small pericardial effusion.  2. Left ventricular ejection fraction, by estimation, is 20 to 25%. The left ventricle has severely decreased function. The left ventricle demonstrates global hypokinesis. The left ventricular internal cavity size was moderately dilated. There is mild left ventricular hypertrophy. Left ventricular diastolic parameters are consistent with Grade III diastolic dysfunction (restrictive).  3. Right ventricular systolic function is severely reduced. The right ventricular size is mildly enlarged. There is moderately elevated pulmonary artery systolic pressure.  4. Left atrial size was mildly dilated.  5. Right atrial size was mildly dilated.  6. The mitral valve is normal in structure. Mild mitral valve regurgitation. No evidence of mitral stenosis.  7. Tricuspid valve regurgitation is moderate.  8. The aortic valve has an indeterminant number of cusps. Aortic valve regurgitation is not visualized. No aortic stenosis is present.  9. The inferior vena cava is dilated in size with <50% respiratory variability, suggesting right atrial pressure of 15 mmHg. FINDINGS  Left Ventricle: Left ventricular ejection fraction, by estimation, is 20 to 25%. The left ventricle has severely decreased function. The left ventricle demonstrates global hypokinesis. The left ventricular internal cavity size was moderately dilated. There is mild left ventricular hypertrophy. Left ventricular diastolic parameters are consistent with Grade III diastolic dysfunction (restrictive). Right Ventricle: The right ventricular size is mildly enlarged.Right ventricular systolic function is severely reduced. There is moderately elevated pulmonary artery systolic pressure. The tricuspid regurgitant velocity is 3.30 m/s, and with an assumed right atrial pressure of 15 mmHg, the estimated right  ventricular systolic pressure is 41.3 mmHg. Left Atrium: Left atrial size was mildly dilated. Right Atrium: Right atrial size was mildly dilated. Pericardium: A small pericardial effusion is present. Mitral Valve: The mitral valve is normal in structure. Normal mobility of the mitral valve leaflets. Mild mitral valve regurgitation. No evidence of mitral valve stenosis. Tricuspid Valve: The tricuspid valve is normal in structure. Tricuspid valve regurgitation is moderate . No evidence of tricuspid stenosis. Aortic Valve: The aortic valve has an indeterminant number of cusps. Aortic valve regurgitation is not visualized. No aortic stenosis is present. Aortic valve mean gradient measures 3.2 mmHg. Aortic valve peak gradient measures 6.6 mmHg. Aortic valve area, by VTI measures 2.71 cm. Pulmonic Valve: The pulmonic valve was not well visualized. Pulmonic valve regurgitation is trivial. No evidence of pulmonic stenosis. Aorta: The aortic root is normal in size and structure. Venous: The inferior vena cava is dilated in size with less than 50% respiratory variability, suggesting right atrial pressure of 15 mmHg. IAS/Shunts: No atrial level shunt detected by color flow Doppler. Additional Comments: Severe global reduction in LV systolic function; restrictive filling; 4 chamber enlargement; severe RV dysfunction; mild MR; moderate TR; moderate pulmonary hypertension; small pericardial effusion.  LEFT VENTRICLE PLAX 2D LVIDd:         6.16 cm LVIDs:         5.03 cm LV PW:         1.38 cm LV IVS:        1.53 cm LVOT diam:     2.30 cm LV SV:         55 LV SV Index:   22 LVOT Area:     4.15 cm  LV Volumes (MOD) LV vol d, MOD A2C: 318.0 ml LV vol d, MOD A4C: 215.0 ml LV vol s, MOD A2C: 232.0 ml LV vol s, MOD A4C: 177.0  ml LV SV MOD A2C:     86.0 ml LV SV MOD A4C:     215.0 ml LV SV MOD BP:      57.1 ml RIGHT VENTRICLE            IVC RV Basal diam:  4.02 cm    IVC diam: 3.59 cm RV Mid diam:    4.69 cm RV S prime:     6.31 cm/s  TAPSE (M-mode): 0.8 cm LEFT ATRIUM             Index       RIGHT ATRIUM           Index LA diam:        5.20 cm 2.08 cm/m  RA Area:     23.90 cm LA Vol (A2C):   58.1 ml 23.20 ml/m RA Volume:   72.00 ml  28.75 ml/m LA Vol (A4C):   98.6 ml 39.37 ml/m LA Biplane Vol: 84.0 ml 33.54 ml/m  AORTIC VALVE AV Area (Vmax):    2.89 cm AV Area (Vmean):   2.79 cm AV Area (VTI):     2.71 cm AV Vmax:           128.60 cm/s AV Vmean:          85.780 cm/s AV VTI:            0.202 m AV Peak Grad:      6.6 mmHg AV Mean Grad:      3.2 mmHg LVOT Vmax:         89.40 cm/s LVOT Vmean:        57.620 cm/s LVOT VTI:          0.132 m LVOT/AV VTI ratio: 0.65  AORTA Ao Root diam: 3.30 cm Ao Asc diam:  3.60 cm TRICUSPID VALVE TR Peak grad:   43.6 mmHg TR Vmax:        330.00 cm/s  SHUNTS Systemic VTI:  0.13 m Systemic Diam: 2.30 cm Kirk Ruths MD Electronically signed by Kirk Ruths MD Signature Date/Time: 10/15/2019/4:19:33 PM    Final

## 2019-10-16 NOTE — Progress Notes (Signed)
PT Cancellation Note  Patient Details Name: Joshua [place]sey MRN: [id number] DOB: April 02, 1976   Cancelled Treatment:    Reason Eval/Treat Not Completed: Medical issues which prohibited therapy Pt with nausea/vomiting. Will hold and follow up as schedule allows.   Lou Miner, DPT  Acute Rehabilitation Services  Pager: [phone] Office: [phone]    Rudean Hitt 10/16/2019, 12:24 PM

## 2019-10-16 NOTE — Progress Notes (Signed)
ANTICOAGULATION CONSULT NOTE - Initial Consult  Pharmacy Consult for heparin Indication: atrial fibrillation  No Known Allergies  Patient Measurements: Height: 5\' 11"  (180.3 cm) Weight: 136 kg (299 lb 13.2 oz) IBW/kg (Calculated) : 75.3 Heparin Dosing Weight: 106 kg  Vital Signs: Temp: 99.1 F (37.3 C) (04/28 1508) Temp Source: Oral (04/28 1508) BP: 136/91 (04/28 2000) Pulse Rate: 92 (04/28 2010)  Labs: Recent Labs    10/07/2019 2206 10/15/19 1702 10/16/19 0406 10/16/19 1930  HGB 10.2*  --  9.7*  --   HCT 33.0*  --  31.4*  --   PLT 175  --  152  --   APTT  --   --   --  90*  HEPARINUNFRC  --   --   --  >2.20*  CREATININE 4.81*  --  5.13*  --   TROPONINIHS  --  41*  --   --     Estimated Creatinine Clearance: 26.4 mL/min (A) (by C-G formula based on SCr of 5.13 mg/dL (H)).   Medical History: Past Medical History:  Diagnosis Date  . 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 Chronic kidney disease   . Diabetes mellitus    adult-onset, type 2  . Exogenous obesity   . GERD (gastroesophageal reflux disease)   . Hypertension   . Left foot infection 06/21/2018  . Osteomyelitis of foot, left, acute ([place]) 07/04/2018    Medications:  Scheduled:  . amiodarone  200 mg Oral Daily  . atorvastatin  10 mg Oral Q supper  . carvedilol  25 mg Oral BID WC  . ferrous sulfate  325 mg Oral BID WC  . insulin aspart  0-9 Units Subcutaneous TID WC  . isosorbide-hydrALAZINE  1 tablet Oral BID  . multivitamin with minerals  1 tablet Oral Daily  . pantoprazole  40 mg Oral Daily  . sodium chloride flush  3 mL Intravenous Q12H   Infusions:  . furosemide (LASIX) infusion 12 mg/hr (10/15/19 1520)  . heparin 1,400 Units/hr (10/16/19 1459)    Assessment: 23 yom presenting with severe fluid overload, found to have COVID. On apixaban PTA for hx Afib (LD 4/27@1407 ).  Hgb 9.7, plt 152. D-dimer 15>13.6. Scr continues to increase from 4.81 to 5.13. No s/sx of  bleeding. Last dose of subQ heparin was 4/28@0645 .  Given recent apixaban dosing, will monitor both aPTT and heparin level until correlate.   As expected Heparin level >2.2 likely due to apixaban dose, aPTT 90 secs.  Goal of Therapy:  Heparin level 0.3-0.7 units/ml aPTT 66-102 seconds Monitor platelets by anticoagulation protocol: Yes   Plan:  Continue heparin infusion at 1400 units/hr Check confirmatory anti-Xa level and aPTT with am labs  Continue to monitor H&H and platelets  Alanda Slim, PharmD, [facility] Clinical Pharmacist Please see AMION for all Pharmacists' Contact Phone Numbers 10/16/2019, 9:20 PM

## 2019-10-16 NOTE — ED Notes (Signed)
Breakfast tray at bedside 

## 2019-10-16 NOTE — Progress Notes (Signed)
ANTICOAGULATION CONSULT NOTE - Initial Consult  Pharmacy Consult for heparin Indication: atrial fibrillation  No Known Allergies  Patient Measurements: Height: 5\' 11"  (180.3 cm) Weight: 136 kg (299 lb 13.2 oz) IBW/kg (Calculated) : 75.3 Heparin Dosing Weight: 106 kg  Vital Signs: BP: 118/82 (04/28 1245) Pulse Rate: 72 (04/28 1215)  Labs: Recent Labs    10/06/2019 2206 10/15/19 1702 10/16/19 0406  HGB 10.2*  --  9.7*  HCT 33.0*  --  31.4*  PLT 175  --  152  CREATININE 4.81*  --  5.13*  TROPONINIHS  --  41*  --     Estimated Creatinine Clearance: 26.4 mL/min (A) (by C-G formula based on SCr of 5.13 mg/dL (H)).   Medical History: Past Medical History:  Diagnosis Date  . 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 Chronic kidney disease   . Diabetes mellitus    adult-onset, type 2  . Exogenous obesity   . GERD (gastroesophageal reflux disease)   . Hypertension   . Left foot infection 06/21/2018  . Osteomyelitis of foot, left, acute ([place]) 07/04/2018    Medications:  Scheduled:  . amiodarone  200 mg Oral Daily  . atorvastatin  10 mg Oral Q supper  . carvedilol  25 mg Oral BID WC  . ferrous sulfate  325 mg Oral BID WC  . insulin aspart  0-9 Units Subcutaneous TID WC  . isosorbide-hydrALAZINE  1 tablet Oral BID  . multivitamin with minerals  1 tablet Oral Daily  . pantoprazole  40 mg Oral Daily  . sodium chloride flush  3 mL Intravenous Q12H   Infusions:  . furosemide (LASIX) infusion 12 mg/hr (10/15/19 1520)  . heparin      Assessment: 61 yom presenting with severe fluid overload, found to have COVID. On apixaban PTA for hx Afib (LD 4/27@1407 ).  Hgb 9.7, plt 152. D-dimer 15>13.6. Scr continues to increase from 4.81 to 5.13. No s/sx of bleeding. Last dose of subQ heparin was 4/28@0645 .  Given recent apixaban dosing, will monitor both aPTT and heparin level until correlate.   Goal of Therapy:  Heparin level 0.3-0.7 units/ml aPTT  66-102 seconds Monitor platelets by anticoagulation protocol: Yes   Plan:  Start heparin infusion at 1400 units/hr Check anti-Xa level in 8 hours and daily while on heparin Continue to monitor H&H and platelets  Antonietta Jewel, PharmD, [place] Pharmacist  Phone: [phone] 10/16/2019 2:04 PM  Please check AMION for all [place] phone numbers After 10:00 PM, call [place] [phone]

## 2019-10-16 NOTE — Progress Notes (Signed)
EVYN PUTZIER [id number] Admission Data: 10/16/2019 3:44 PM Attending Provider: Thurnell Lose, MD  HYI:FOYDXA, Malka So, MD  ADA WOODBURY is a 44 y.o. male patient admitted from ED awake, alert  & orientated  X 3,  Full Code, VSS - Blood pressure 136/90, pulse 84, temperature 99.1 F (37.3 C), temperature source Oral, resp. rate 15, height 5\' 11"  (1.803 m), weight 136 kg, SpO2 97 %., O2 No c/o shortness of breath, no c/o chest pain, no distress noted. Tele # [id number] placed and pt is currently running:normal sinus rhythm.  Pt orientation to unit, room and routine. Information packet given to patient/family.  Admission INP armband ID verified with patient/family, and in place. SR up x 2, fall risk assessment complete with patient and family verbalizing understanding of risks associated with falls. Pt verbalizes an understanding of how to use the call bell and to call for help before getting out of bed. Dual skin assessment performed with another RN. Bilateral lower extremity wounds noted and charted, WOC recommendations followed for dressings. Bariatric bed ordered for pt.   Will continue to monitor and assist as needed.  Hiram Comber, RN 10/16/2019 3:44 PM

## 2019-10-16 NOTE — Progress Notes (Addendum)
Advanced Heart Failure Rounding Note  PCP-Cardiologist: No primary care provider on file.   Subjective:     Yesterday started on lasix drip for marked volume overload. Sluggish urine output documented.   Documented negative 400 cc.    Objective:   Weight Range: 136 kg Body mass index is 41.82 kg/m.   Vital Signs:   Pulse Rate:  [70-95] 72 (04/28 1215) Resp:  [14-20] 17 (04/28 0700) BP: (101-134)/(63-97) 118/82 (04/28 1245) SpO2:  [92 %-100 %] 100 % (04/28 1215)    Weight change: Filed Weights   10/15/19 1143  Weight: 136 kg    Intake/Output:  No intake or output data in the 24 hours ending 10/16/19 1359    Physical Exam    General:  . No resp difficulty HEENT: Normal Neck: Supple. JVP to jaw  Carotids 2+ bilat; no bruits. No lymphadenopathy or thyromegaly appreciated. Cor: PMI nondisplaced. Regular rate & rhythm. No rubs, gallops or murmurs. Lungs: Clear Abdomen: obese, soft, nontender, nondistended. No hepatosplenomegaly. No bruits or masses. Good bowel sounds. Extremities: No cyanosis, clubbing, rash, R and LLE 2-3+ extending to abdomen.  Neuro: Alert & orientedx3, cranial nerves grossly intact. moves all 4 extremities w/o difficulty. Affect pleasant   Telemetry     EKG    N/a   Labs    CBC Recent Labs    10/12/2019 2206 10/16/19 0406  WBC 4.5 4.5  HGB 10.2* 9.7*  HCT 33.0* 31.4*  MCV 98.5 97.8  PLT 175 347   Basic Metabolic Panel Recent Labs    09/28/2019 2206 10/16/19 0406  NA 141 141  K 4.6 4.7  CL 108 108  CO2 19* 18*  GLUCOSE 103* 118*  BUN 95* 100*  CREATININE 4.81* 5.13*  CALCIUM 8.3* 8.1*  MG  --  2.0  PHOS  --  6.5*   Liver Function Tests Recent Labs    10/06/2019 2206  AST 37  ALT 17  ALKPHOS 90  BILITOT 1.3*  PROT 7.3  ALBUMIN 2.8*   Recent Labs    09/29/2019 2206  LIPASE 24   Cardiac Enzymes No results for input(s): CKTOTAL, CKMB, CKMBINDEX, TROPONINI in the last 72 hours.  BNP: BNP (last 3  results) Recent Labs    05/26/19 0310 05/27/19 0242 10/15/19 1107  BNP 1,444.0* 1,403.3* 1,305.3*    ProBNP (last 3 results) No results for input(s): PROBNP in the last 8760 hours.   D-Dimer Recent Labs    10/15/19 1702 10/16/19 0406  DDIMER 15.09* 13.60*   Hemoglobin A1C Recent Labs    10/15/19 1702  HGBA1C 6.3*   Fasting Lipid Panel No results for input(s): CHOL, HDL, LDLCALC, TRIG, CHOLHDL, LDLDIRECT in the last 72 hours. Thyroid Function Tests No results for input(s): TSH, T4TOTAL, T3FREE, THYROIDAB in the last 72 hours.  Invalid input(s): FREET3  Other results:   Imaging    MR FOOT LEFT WO CONTRAST  Result Date: 10/15/2019 CLINICAL DATA:  Bilateral lower extremity swelling, question of osteomyelitis ulcer on the bottom of the left forefoot EXAM: MRI OF THE LEFT FOOT WITHOUT CONTRAST TECHNIQUE: Multiplanar, multisequence MR imaging of the left was performed. No intravenous contrast was administered. COMPARISON:  Multiple radiographs, most recent radiograph same day FINDINGS: Bones/Joint/Cartilage Again noted are chronic erosive type changes seen at the base of the first MTP joint there is stable dorsal on lateral subluxation of the metatarsals at the TMT joint joints with erosive type changes and chronic disruption of the Lisfranc ligaments. There is  small erosive type changes seen throughout the cuneiform. There is no acute marrow signal abnormality seen throughout the osseous structures however. No large joint effusions are present. Probable healed fracture deformity of the fifth metatarsal base seen. There is diffuse chronic periosteal reaction seen through the second through fourth metatarsal shafts. Ligaments Chronic disruptions of the Lisfranc ligaments as described above. Muscles and Tendons Diffuse fatty atrophy with denervation seen throughout the forefoot. The flexor and extensor tendons appear to be grossly intact, however there is surrounding fluid signal on  surrounding the flexor digitorum tendons. Soft tissues On the medial plantar forefoot there is areas of superficial ulceration with diffuse skin thickening and subcutaneous edema seen. No loculated fluid collections or sinus tract however are noted. There is diffuse dorsal subcutaneous edema seen on the dorsum of the forefoot. IMPRESSION: 1. 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 No definite evidence acute osteomyelitis. 2. Area superficial ulceration on the plantar base beneath the first metatarsal. No definite loculated fluid collection or sinus tract. 3. Diffuse subcutaneous edema and cellulitis surrounding the forefoot. Electronically Signed   By: Prudencio Pair M.D.   On: 10/15/2019 22:27   ECHOCARDIOGRAM COMPLETE  Result Date: 10/15/2019    ECHOCARDIOGRAM REPORT   Patient Name:   DEYON CHIZEK Date of Exam: 10/15/2019 Medical Rec #:  [id number]        Height:       71.0 in Accession #:    [id number]       Weight:       299.8 lb Date of Birth:  June 14, 1976        BSA:          2.504 m Patient Age:    44 years         BP:           144/99 mmHg Patient Gender: M                HR:           92 bpm. Exam Location:  Inpatient Procedure: 2D Echo, Cardiac Doppler and Color Doppler Indications:    CHF-Acute Systolic 573.22/G25.42  History:        Patient has prior history of Echocardiogram examinations. CHF,                 CAD; Risk Factors:Dyslipidemia. Non-ischemi cardiomyopathy. CKD.  Sonographer:    Clayton Lefort RDCS (AE) Referring Phys: [id number] Lequita Halt  Sonographer Comments: Patient is morbidly obese. IMPRESSIONS  1. Severe global reduction in LV systolic function; restrictive filling; 4 chamber enlargement; severe RV dysfunction; mild MR; moderate TR; moderate pulmonary hypertension; small pericardial effusion.  2. Left ventricular ejection fraction, by estimation, is 20 to 25%. The left ventricle has severely decreased function.  The left ventricle demonstrates global hypokinesis. The left ventricular internal cavity size was moderately dilated. There is mild left ventricular hypertrophy. Left ventricular diastolic parameters are consistent with Grade III diastolic dysfunction (restrictive).  3. Right ventricular systolic function is severely reduced. The right ventricular size is mildly enlarged. There is moderately elevated pulmonary artery systolic pressure.  4. Left atrial size was mildly dilated.  5. Right atrial size was mildly dilated.  6. The mitral valve is normal in structure. Mild mitral valve regurgitation. No evidence of mitral stenosis.  7. Tricuspid valve regurgitation is moderate.  8. The aortic valve has an indeterminant number of cusps. Aortic valve regurgitation is not visualized.  No aortic stenosis is present.  9. The inferior vena cava is dilated in size with <50% respiratory variability, suggesting right atrial pressure of 15 mmHg. FINDINGS  Left Ventricle: Left ventricular ejection fraction, by estimation, is 20 to 25%. The left ventricle has severely decreased function. The left ventricle demonstrates global hypokinesis. The left ventricular internal cavity size was moderately dilated. There is mild left ventricular hypertrophy. Left ventricular diastolic parameters are consistent with Grade III diastolic dysfunction (restrictive). Right Ventricle: The right ventricular size is mildly enlarged.Right ventricular systolic function is severely reduced. There is moderately elevated pulmonary artery systolic pressure. The tricuspid regurgitant velocity is 3.30 m/s, and with an assumed right atrial pressure of 15 mmHg, the estimated right ventricular systolic pressure is 51.8 mmHg. Left Atrium: Left atrial size was mildly dilated. Right Atrium: Right atrial size was mildly dilated. Pericardium: A small pericardial effusion is present. Mitral Valve: The mitral valve is normal in structure. Normal mobility of the mitral valve  leaflets. Mild mitral valve regurgitation. No evidence of mitral valve stenosis. Tricuspid Valve: The tricuspid valve is normal in structure. Tricuspid valve regurgitation is moderate . No evidence of tricuspid stenosis. Aortic Valve: The aortic valve has an indeterminant number of cusps. Aortic valve regurgitation is not visualized. No aortic stenosis is present. Aortic valve mean gradient measures 3.2 mmHg. Aortic valve peak gradient measures 6.6 mmHg. Aortic valve area, by VTI measures 2.71 cm. Pulmonic Valve: The pulmonic valve was not well visualized. Pulmonic valve regurgitation is trivial. No evidence of pulmonic stenosis. Aorta: The aortic root is normal in size and structure. Venous: The inferior vena cava is dilated in size with less than 50% respiratory variability, suggesting right atrial pressure of 15 mmHg. IAS/Shunts: No atrial level shunt detected by color flow Doppler. Additional Comments: Severe global reduction in LV systolic function; restrictive filling; 4 chamber enlargement; severe RV dysfunction; mild MR; moderate TR; moderate pulmonary hypertension; small pericardial effusion.  LEFT VENTRICLE PLAX 2D LVIDd:         6.16 cm LVIDs:         5.03 cm LV PW:         1.38 cm LV IVS:        1.53 cm LVOT diam:     2.30 cm LV SV:         55 LV SV Index:   22 LVOT Area:     4.15 cm  LV Volumes (MOD) LV vol d, MOD A2C: 318.0 ml LV vol d, MOD A4C: 215.0 ml LV vol s, MOD A2C: 232.0 ml LV vol s, MOD A4C: 177.0 ml LV SV MOD A2C:     86.0 ml LV SV MOD A4C:     215.0 ml LV SV MOD BP:      57.1 ml RIGHT VENTRICLE            IVC RV Basal diam:  4.02 cm    IVC diam: 3.59 cm RV Mid diam:    4.69 cm RV S prime:     6.31 cm/s TAPSE (M-mode): 0.8 cm LEFT ATRIUM             Index       RIGHT ATRIUM           Index LA diam:        5.20 cm 2.08 cm/m  RA Area:     23.90 cm LA Vol (A2C):   58.1 ml 23.20 ml/m RA Volume:   72.00 ml  28.75 ml/m LA  Vol (A4C):   98.6 ml 39.37 ml/m LA Biplane Vol: 84.0 ml 33.54 ml/m   AORTIC VALVE AV Area (Vmax):    2.89 cm AV Area (Vmean):   2.79 cm AV Area (VTI):     2.71 cm AV Vmax:           128.60 cm/s AV Vmean:          85.780 cm/s AV VTI:            0.202 m AV Peak Grad:      6.6 mmHg AV Mean Grad:      3.2 mmHg LVOT Vmax:         89.40 cm/s LVOT Vmean:        57.620 cm/s LVOT VTI:          0.132 m LVOT/AV VTI ratio: 0.65  AORTA Ao Root diam: 3.30 cm Ao Asc diam:  3.60 cm TRICUSPID VALVE TR Peak grad:   43.6 mmHg TR Vmax:        330.00 cm/s  SHUNTS Systemic VTI:  0.13 m Systemic Diam: 2.30 cm Kirk Ruths MD Electronically signed by Kirk Ruths MD Signature Date/Time: 10/15/2019/4:19:33 PM    Final       Medications:     Scheduled Medications: . amiodarone  200 mg Oral Daily  . atorvastatin  10 mg Oral Q supper  . carvedilol  25 mg Oral BID WC  . ferrous sulfate  325 mg Oral BID WC  . heparin injection (subcutaneous)  5,000 Units Subcutaneous Q8H  . insulin aspart  0-9 Units Subcutaneous TID WC  . isosorbide-hydrALAZINE  1 tablet Oral BID  . multivitamin with minerals  1 tablet Oral Daily  . pantoprazole  40 mg Oral Daily  . sodium chloride flush  3 mL Intravenous Q12H     Infusions: . furosemide (LASIX) infusion 12 mg/hr (10/15/19 1520)     PRN Medications:  acetaminophen, gabapentin, LORazepam, ondansetron (ZOFRAN) IV     Assessment/Plan    1. A/C Systolic Heart Failure  ECHO 08/2018 EF 20-25% and normal RV. EF has been down since 2013.  Suspected NICM. [place] 2013 with nonobstructive cad.  Acute decompensation in the setting of no medications for the last month. He never returned for HF follow up since his last admit 05/2019 - CXR with pulmonary congestion + COVID 19 - Marked volume overload.  - Started on lasix drip with minimal urine output. Will give metolazone 2.5 x 1.  - No spiro , dig, arni with creatinine 5,1   2. L foot, plantar wound  - Moderate exudate + odor - WBC normal.   -Xray of foot - No suggestive of osteo.  Will  need WOC  3. RLE Multiple open wounds  Moderate serous exudate/+ odor  - Hopefully this will improve with diuresis and wound care.   4.  CKD Stage IV  Creatinine on admit 4.8 -->5.1 - only 400 cc urine output documented. May need nephrology.  He has not had nephrology follow up.   5. PAF -In NSR today .  -Continue  amio 200 mg daily - Off eliquis. On Heparin dripStart eliquis 5 mg twice a day   6. DMII Non compliant witth meds. Hgb A1C 6.3   7. COVID 19  +SARS2 D-Dimer 15>13   8. Poor Literacy  Plan per Dr Aundra Dubin.   He is not a candidate for advanced therapies with CKD Stage IV and ongoing noncompliance.  He was discharged from HF Paramedicine 08/2019  because he wound not answer the phone for follow up.    Length of Stay: 1  Amy Clegg, NP  10/16/2019, 1:59 PM  Advanced Heart Failure Team Pager [phone] (M-F; 7a - 4p)  Please contact [facility] for night-coverage after hours (4p -7a ) and weekends on [email or web address]  Patient remains short of breath.  Found to be COVID-19+.  Hard to tell how much he is diuresing, not well-recorded in the ER. Creatinine up to 5.13.  General: NAD Neck: JVP 16 cm, no thyromegaly or thyroid nodule.  Lungs: Decreased at bases.  CV: Nondisplaced PMI.  Heart regular S1/S2, no S3/S4, no murmur. 2+ chronic edema to thighs.   Abdomen: Soft, nontender, no hepatosplenomegaly, no distention.  Skin: Intact without lesions or rashes.  Neurologic: Alert and oriented x 3.  Psych: Normal affect. Extremities: No clubbing or cyanosis. Feet wrapped.  HEENT: Normal.   1. Acute on chronic systolic CHF: Nonischemic cardiomyopathy (long-standing, cath in 2013 with 50% mLAD stenosis).  Echo this admission with EF 20-25%, moderate RV dilation, mild-moderately dilated RV with severe systolic dysfunction.  Complicated by CKD stage 4.  He has been noncompliant with meds and followup, out of meds for uncertain period.  NYHA class IV symptoms.  Marked volume  overload. He also has COVID-19 viral pneumonitis, but suspect main issue is volume overload. Creatinine up mildly to 5.13.  - Bidil 1 tab tid.  - Coreg 25 mg bid - Lasix gtt 12 mg/hr and give dose of metolazone 2.5 x 1 today.  - I am concerned that he will soon progress to HD.   2. CKD: stage IV.  Creatinine mildly higher at 5.13.  I am concerned that he is going to progress to ESRD soon.  - Diuresis as above. 3. LE wounds: Draining wounds on feet. Foot XR not suggestive of osteomyelitis. Charcot arthropathy.  - Wound consult appreciated.  - Lower extremity dopplers assess PAD.  4. Atrial fibrillation: Paroxysmal.  He is in NSR.   - Continue amiodarone 200 daily.  - Heparin gtt (apixaban on hold in case procedures needed).  5. COVID-19 viral pneumonitis: Suspect this is not the main cause of his dyspnea (think volume overload in setting of CKD stage 4, cardiomyopathy, and noncompliance).  Loralie Champagne 10/16/2019 4:13 PM

## 2019-10-16 NOTE — ED Notes (Signed)
Lunch Tray Ordered @ 1038.  

## 2019-10-16 NOTE — Progress Notes (Signed)
Orthopedic Tech Progress Note Patient Details:  [name] [birth year] [id number]  Ortho Devices Type of Ortho Device: Postop shoe/boot Ortho Device/Splint Location: LLE Ortho Device/Splint Interventions: Application   Post Interventions Patient Tolerated: Well Instructions Provided: Adjustment of device    [name] E [name] 10/16/2019, 6:51 PM

## 2019-10-16 NOTE — Consult Note (Signed)
ORTHOPAEDIC CONSULTATION  REQUESTING PHYSICIAN: Thurnell Lose, MD  Chief Complaint: Congestive heart failure with Wagner grade 1 ulcer plantar aspect left foot with Charcot arthropathy.  HPI: Joshua Massey is a 44 y.o. 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  Past Medical History:  Diagnosis Date  . 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 Chronic kidney disease   . Diabetes mellitus    adult-onset, type 2  . Exogenous obesity   . GERD (gastroesophageal reflux disease)   . Hypertension   . Left foot infection 06/21/2018  . Osteomyelitis of foot, left, acute ([place]) 07/04/2018   Past Surgical History:  Procedure Laterality Date  . ANKLE SURGERY  2003   plate and screws  . APPLICATION OF WOUND VAC Left 06/12/2018   Procedure: APPLICATION OF WOUND VAC;  Surgeon: Mcarthur Rossetti, MD;  Location: [facility];  Service: Orthopedics;  Laterality: Left;  . CARDIAC CATHETERIZATION  02/16/2012   50% LAD lesion in mid vessel, otherwise no significant obstructive CAD  . HIP PINNING  1990   bilateral  . I & D EXTREMITY Left 06/10/2018   Procedure: IRRIGATION AND DEBRIDEMENT OF FOOT;  Surgeon: Mcarthur Rossetti, MD;  Location: [facility];  Service: Orthopedics;  Laterality: Left;  . I & D EXTREMITY Left 06/12/2018   Procedure: REPEAT IRRIGATION AND DEBRIDEMENT LEFT FOOT;  Surgeon: Mcarthur Rossetti, MD;  Location: [facility];  Service: Orthopedics;  Laterality: Left;  . I & D EXTREMITY Left 07/04/2018   Procedure: DEBRIDEMENT OF LEFT FOOT;  Surgeon: Newt Minion, MD;  Location: [facility];  Service: Orthopedics;  Laterality: Left;  . IR GENERIC HISTORICAL  09/01/2016   IR FLUORO GUIDE CV LINE RIGHT 09/01/2016 Marybelle Killings, MD [facility]  . IR GENERIC HISTORICAL  09/01/2016   IR US GUIDE VASC ACCESS RIGHT 09/01/2016 Marybelle Killings, MD [facility]  . IR GENERIC HISTORICAL  09/16/2016   IR REMOVAL TUN CV CATH W/O FL 09/16/2016 Arne Cleveland, MD [facility]  . KNEE ARTHROSCOPY Left 08/30/2016   Procedure: ARTHROSCOPY KNEE;  Surgeon: Renette Butters, MD;  Location: [facility];  Service: Orthopedics;  Laterality: Left;  . LEFT AND RIGHT HEART CATHETERIZATION WITH CORONARY ANGIOGRAM N/A 02/16/2012   Procedure: LEFT AND RIGHT HEART CATHETERIZATION WITH CORONARY ANGIOGRAM;  Surgeon: Pixie Casino, MD;  Location: [facility] C[facility];  Service: Cardiovascular;  Laterality: N/A;  . TRANSTHORACIC ECHOCARDIOGRAM  08/31/2012   EF 35-40%, no significant wall motion abnormalities, diastolic relaxation abnormality - no longer needed LifeVest; LV cavity size mod dilated with mod conc hypertrophy, systolic function mod reduced; mild MR, LA mod dilated   Social History   Socioeconomic History  . Marital status: Single    Spouse name: Not on file  . Number of children: 6  . Years of education: 23  . Highest education level: Not on file  Occupational History  . Occupation: Brewing technologist    Comment: [name]  . Occupation: newpaper delivery    Employer: [employer]  Tobacco Use  . Smoking status: Never Smoker  . Smokeless tobacco: Never Used  Substance and Sexual Activity  . Alcohol use: No  . Drug use: No  . Sexual activity: Yes    Birth control/protection:  Condom  Other Topics Concern  . Not on file  Social History Narrative  . Not on file   Social Determinants of Health   Financial Resource Strain:   . Difficulty of Paying Living Expenses:   Food Insecurity:   . Worried About Charity fundraiser in the Last Year:   . Arboriculturist in the Last Year:   Transportation Needs:   . Film/video editor (Medical):   Marland Kitchen Lack of Transportation (Non-Medical):   Physical Activity:   . Days of Exercise per Week:   . Minutes of Exercise per Session:   Stress:     . Feeling of Stress :   Social Connections:   . Frequency of Communication with Friends and Family:   . Frequency of Social Gatherings with Friends and Family:   . Attends Religious Services:   . Active Member of Clubs or Organizations:   . Attends Archivist Meetings:   Marland Kitchen Marital Status:    Family History  Problem Relation Age of Onset  . Coronary artery disease Brother        CABG at 32, DM  . Heart disease Brother 72       CAD  . Diabetes Mother   . Hypertension Mother   . Hyperlipidemia Mother   . Heart disease Mother   . Diabetes Father   . Hypertension Father    - negative except otherwise stated in the family history section No Known Allergies Prior to Admission medications   Medication Sig Start Date End Date Taking? Authorizing Provider  acetaminophen (TYLENOL) 500 MG tablet Take 1,000 mg by mouth every 6 (six) hours as needed for mild pain.   Yes [provider]  apixaban (ELIQUIS) 5 MG TABS tablet Take 1 tablet (5 mg total) by mouth 2 (two) times daily. 11/06/18  Yes [name], [name], MD  atorvastatin (LIPITOR) 10 MG tablet Take 1 tablet (10 mg total) by mouth daily with supper. Patient taking differently: Take 10 mg by mouth every morning.  08/07/18  Yes [name], [name], MD  carvedilol (COREG) 25 MG tablet TAKE 1 TABLET BY MOUTH TWICE DAILY WITH  A  MEAL. Patient taking differently: Take 25 mg by mouth 2 (two) times daily with a meal.  06/17/19  Yes [name], [name], MD  Ferrous Sulfate (IRON) 325 (65 Fe) MG TABS TAKE 1 TABLET BY MOUTH TWICE DAILY WITH MEALS  Patient taking differently: Take 325 mg by mouth 2 (two) times daily with a meal.  07/12/19  Yes [name], [name], MD  gabapentin (NEURONTIN) 300 MG capsule Take 1 capsule (300 mg total) by mouth at bedtime. Patient taking differently: Take 300 mg by mouth at bedtime as needed (pain).  10/25/18  Yes Rayburn, Neta Mends, PA-C  insulin aspart (NOVOLOG FLEXPEN) 100 UNIT/ML FlexPen Inject 0-15 Units  into the skin 3 (three) times daily with meals. 0-15U/Subcu/TIDw/meals//CBG70-120:0U/CBG121-150:2U/CBG151-200:3U/CBG201-250:5U/CBG251-300:8U/CBG301-350:11U/CBG351-400:15U//CBG>400:Call MD 11/14/18  Yes [name], [name], MD  Insulin Glargine (LANTUS) 100 UNIT/ML Solostar Pen Inject 5 Units into the skin at bedtime. Patient taking differently: Inject 1 Units into the skin at bedtime.  11/06/18  Yes [name], [name], MD  isosorbide-hydrALAZINE (BIDIL) 20-37.5 MG tablet Take 1 tablet by mouth 2 (two) times daily. 07/24/19  Yes Larey Dresser, MD  Multiple Vitamin (MULTIVITAMIN WITH MINERALS) TABS tablet Take 1 tablet by mouth daily. 09/07/18  Yes Bonnielee Haff, MD  silver sulfADIAZINE (SILVADENE) 1 % cream Apply topically daily. Silvadene plus dry dressing to  plantar ulcer left foot.  Change daily. Patient taking differently: Apply 1 application topically daily as needed (skin care). Silvadene plus dry dressing to plantar ulcer left foot.  Change daily. 09/07/18  Yes Bonnielee Haff, MD  torsemide (DEMADEX) 100 MG tablet Take 1 tablet (100 mg total) by mouth daily. 07/24/19 10/15/19 Yes Larey Dresser, MD  Vitamin D, Ergocalciferol, (DRISDOL) 1.25 MG (50000 UT) CAPS capsule Take 1 capsule (50,000 Units total) by mouth every 7 (seven) days. Patient taking differently: Take 50,000 Units by mouth every 7 (seven) days. Friday 11/06/18  Yes [name], [name], MD  amiodarone (PACERONE) 200 MG tablet Take 1 tablet (200 mg total) by mouth daily. 06/04/19 07/04/19  Darliss Cheney, MD  Continuous Blood Gluc Receiver (FREESTYLE LIBRE 14 DAY READER) DEVI 1 Device by Does not apply route daily. 11/06/18   [name], [name], MD  Continuous Blood Gluc Sensor (FREESTYLE LIBRE 14 DAY SENSOR) MISC Inject 1 Device into the skin 4 (four) times daily as needed. 11/06/18   [name], [name], MD  glucose blood (FREESTYLE TEST STRIPS) test strip Use as instructed 09/02/16   Ghimire, Henreitta Leber, MD  glucose monitoring kit (FREESTYLE) monitoring kit  1 each by Does not apply route 4 (four) times daily - after meals and at bedtime. 1 month Diabetic Testing Supplies for QAC-QHS accuchecks. 09/02/16   Ghimire, Henreitta Leber, MD  Insulin Pen Needle 32G X 8 MM MISC Use as directed 09/02/16   Jonetta Osgood, MD  Lancets (FREESTYLE) lancets Use as instructed 09/02/16   Ghimire, Henreitta Leber, MD  oxyCODONE-acetaminophen (PERCOCET/ROXICET) 5-325 MG tablet Take 1 tablet by mouth every 8 (eight) hours as needed for moderate pain or severe pain. Patient not taking: Reported on 10/15/2019 10/25/18   Rayburn, Neta Mends, PA-C   MR FOOT LEFT WO CONTRAST  Result Date: 10/15/2019 CLINICAL DATA:  Bilateral lower extremity swelling, question of osteomyelitis ulcer on the bottom of the left forefoot EXAM: MRI OF THE LEFT FOOT WITHOUT CONTRAST TECHNIQUE: Multiplanar, multisequence MR imaging of the left was performed. No intravenous contrast was administered. COMPARISON:  Multiple radiographs, most recent radiograph same day FINDINGS: Bones/Joint/Cartilage Again noted are chronic erosive type changes seen at the base of the first MTP joint there is stable dorsal on lateral subluxation of the metatarsals at the TMT joint joints with erosive type changes and chronic disruption of the Lisfranc ligaments. There is small erosive type changes seen throughout the cuneiform. There is no acute marrow signal abnormality seen throughout the osseous structures however. No large joint effusions are present. Probable healed fracture deformity of the fifth metatarsal base seen. There is diffuse chronic periosteal reaction seen through the second through fourth metatarsal shafts. Ligaments Chronic disruptions of the Lisfranc ligaments as described above. Muscles and Tendons Diffuse fatty atrophy with denervation seen throughout the forefoot. The flexor and extensor tendons appear to be grossly intact, however there is surrounding fluid signal on surrounding the flexor digitorum tendons. Soft  tissues On the medial plantar forefoot there is areas of superficial ulceration with diffuse skin thickening and subcutaneous edema seen. No loculated fluid collections or sinus tract however are noted. There is diffuse dorsal subcutaneous edema seen on the dorsum of the forefoot. IMPRESSION: 1. 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 No definite evidence acute osteomyelitis. 2. Area superficial ulceration on the plantar base beneath the first metatarsal. No definite loculated fluid collection or sinus tract. 3. Diffuse subcutaneous edema  and cellulitis surrounding the forefoot. Electronically Signed   By: Prudencio Pair M.D.   On: 10/15/2019 22:27   DG Chest Port 1 View  Result Date: 10/15/2019 CLINICAL DATA:  Lower extremity edema EXAM: PORTABLE CHEST 1 VIEW COMPARISON:  05/22/2019 FINDINGS: Very low lung volumes limiting evaluation. Pulmonary vascular congestion with possible mild perihilar edema. No significant pleural effusion. No pneumothorax. Stable cardiomegaly. IMPRESSION: Pulmonary vascular congestion with possible mild edema. Electronically Signed   By: Macy Mis M.D.   On: 10/15/2019 09:11   DG Foot Complete Left  Result Date: 10/15/2019 CLINICAL DATA:  Nonhealing wound to left foot. EXAM: LEFT FOOT - COMPLETE 3+ VIEW COMPARISON:  August 29, 2018. FINDINGS: Vascular calcifications are noted. Continued chronic dislocations degenerative changes seen involving the tarsometatarsal joints. Stable defect of middle portion of second middle phalanx is noted. Diffuse soft tissue swelling is noted. No acute lytic destruction is seen to suggest osteomyelitis. IMPRESSION: Stable chronic findings as described above. No acute lytic destruction is seen to suggest osteomyelitis. Diffuse soft tissue swelling is noted. Electronically Signed   By: Marijo Conception M.D.   On: 10/15/2019 09:30   ECHOCARDIOGRAM COMPLETE  Result Date:  10/15/2019    ECHOCARDIOGRAM REPORT   Patient Name:   Joshua Massey Date of Exam: 10/15/2019 Medical Rec #:  [id number]        Height:       71.0 in Accession #:    [id number]       Weight:       299.8 lb Date of Birth:  [birth year]        BSA:          2.504 m Patient Age:    87 years         BP:           144/99 mmHg Patient Gender: M                HR:           92 bpm. Exam Location:  Inpatient Procedure: 2D Echo, Cardiac Doppler and Color Doppler Indications:    CHF-Acute Systolic 726.20/B55.97  History:        Patient has prior history of Echocardiogram examinations. CHF,                 CAD; Risk Factors:Dyslipidemia. Non-ischemi cardiomyopathy. CKD.  Sonographer:    Clayton Lefort RDCS (AE) Referring Phys: [id number] Lequita Halt  Sonographer Comments: Patient is morbidly obese. IMPRESSIONS  1. Severe global reduction in LV systolic function; restrictive filling; 4 chamber enlargement; severe RV dysfunction; mild MR; moderate TR; moderate pulmonary hypertension; small pericardial effusion.  2. Left ventricular ejection fraction, by estimation, is 20 to 25%. The left ventricle has severely decreased function. The left ventricle demonstrates global hypokinesis. The left ventricular internal cavity size was moderately dilated. There is mild left ventricular hypertrophy. Left ventricular diastolic parameters are consistent with Grade III diastolic dysfunction (restrictive).  3. Right ventricular systolic function is severely reduced. The right ventricular size is mildly enlarged. There is moderately elevated pulmonary artery systolic pressure.  4. Left atrial size was mildly dilated.  5. Right atrial size was mildly dilated.  6. The mitral valve is normal in structure. Mild mitral valve regurgitation. No evidence of mitral stenosis.  7. Tricuspid valve regurgitation is moderate.  8. The aortic valve has an indeterminant number of cusps. Aortic valve regurgitation is not visualized. No aortic stenosis is present.  9.  The  inferior vena cava is dilated in size with <50% respiratory variability, suggesting right atrial pressure of 15 mmHg. FINDINGS  Left Ventricle: Left ventricular ejection fraction, by estimation, is 20 to 25%. The left ventricle has severely decreased function. The left ventricle demonstrates global hypokinesis. The left ventricular internal cavity size was moderately dilated. There is mild left ventricular hypertrophy. Left ventricular diastolic parameters are consistent with Grade III diastolic dysfunction (restrictive). Right Ventricle: The right ventricular size is mildly enlarged.Right ventricular systolic function is severely reduced. There is moderately elevated pulmonary artery systolic pressure. The tricuspid regurgitant velocity is 3.30 m/s, and with an assumed right atrial pressure of 15 mmHg, the estimated right ventricular systolic pressure is 60.4 mmHg. Left Atrium: Left atrial size was mildly dilated. Right Atrium: Right atrial size was mildly dilated. Pericardium: A small pericardial effusion is present. Mitral Valve: The mitral valve is normal in structure. Normal mobility of the mitral valve leaflets. Mild mitral valve regurgitation. No evidence of mitral valve stenosis. Tricuspid Valve: The tricuspid valve is normal in structure. Tricuspid valve regurgitation is moderate . No evidence of tricuspid stenosis. Aortic Valve: The aortic valve has an indeterminant number of cusps. Aortic valve regurgitation is not visualized. No aortic stenosis is present. Aortic valve mean gradient measures 3.2 mmHg. Aortic valve peak gradient measures 6.6 mmHg. Aortic valve area, by VTI measures 2.71 cm. Pulmonic Valve: The pulmonic valve was not well visualized. Pulmonic valve regurgitation is trivial. No evidence of pulmonic stenosis. Aorta: The aortic root is normal in size and structure. Venous: The inferior vena cava is dilated in size with less than 50% respiratory variability, suggesting right atrial  pressure of 15 mmHg. IAS/Shunts: No atrial level shunt detected by color flow Doppler. Additional Comments: Severe global reduction in LV systolic function; restrictive filling; 4 chamber enlargement; severe RV dysfunction; mild MR; moderate TR; moderate pulmonary hypertension; small pericardial effusion.  LEFT VENTRICLE PLAX 2D LVIDd:         6.16 cm LVIDs:         5.03 cm LV PW:         1.38 cm LV IVS:        1.53 cm LVOT diam:     2.30 cm LV SV:         55 LV SV Index:   22 LVOT Area:     4.15 cm  LV Volumes (MOD) LV vol d, MOD A2C: 318.0 ml LV vol d, MOD A4C: 215.0 ml LV vol s, MOD A2C: 232.0 ml LV vol s, MOD A4C: 177.0 ml LV SV MOD A2C:     86.0 ml LV SV MOD A4C:     215.0 ml LV SV MOD BP:      57.1 ml RIGHT VENTRICLE            IVC RV Basal diam:  4.02 cm    IVC diam: 3.59 cm RV Mid diam:    4.69 cm RV S prime:     6.31 cm/s TAPSE (M-mode): 0.8 cm LEFT ATRIUM             Index       RIGHT ATRIUM           Index LA diam:        5.20 cm 2.08 cm/m  RA Area:     23.90 cm LA Vol (A2C):   58.1 ml 23.20 ml/m RA Volume:   72.00 ml  28.75 ml/m LA Vol (A4C):   98.6 ml 39.37 ml/m  LA Biplane Vol: 84.0 ml 33.54 ml/m  AORTIC VALVE AV Area (Vmax):    2.89 cm AV Area (Vmean):   2.79 cm AV Area (VTI):     2.71 cm AV Vmax:           128.60 cm/s AV Vmean:          85.780 cm/s AV VTI:            0.202 m AV Peak Grad:      6.6 mmHg AV Mean Grad:      3.2 mmHg LVOT Vmax:         89.40 cm/s LVOT Vmean:        57.620 cm/s LVOT VTI:          0.132 m LVOT/AV VTI ratio: 0.65  AORTA Ao Root diam: 3.30 cm Ao Asc diam:  3.60 cm TRICUSPID VALVE TR Peak grad:   43.6 mmHg TR Vmax:        330.00 cm/s  SHUNTS Systemic VTI:  0.13 m Systemic Diam: 2.30 cm Kirk Ruths MD Electronically signed by Kirk Ruths MD Signature Date/Time: 10/15/2019/4:19:33 PM    Final    - pertinent xrays, CT, MRI studies were reviewed and independently interpreted  Positive ROS: All other systems have been reviewed and were otherwise negative with the  exception of those mentioned in the HPI and as above.  Physical Exam: General: Alert, no acute distress Psychiatric: Patient is competent for consent with normal mood and affect Lymphatic: No axillary or cervical lymphadenopathy Cardiovascular: No pedal edema Respiratory: No cyanosis, no use of accessory musculature GI: No organomegaly, abdomen is soft and non-tender    Images:  _0 @  Labs:  Lab Results  Component Value Date   HGBA1C 6.3 (H) 10/15/2019   HGBA1C 6.8 (H) 05/21/2019   HGBA1C 8.5 (H) 06/10/2018   ESRSEDRATE 5 08/30/2018   CRP 5.2 (H) 10/16/2019   CRP 5.1 (H) 10/15/2019   CRP <0.8 08/30/2018   REPTSTATUS 06/15/2018 FINAL 06/10/2018   GRAMSTAIN  06/10/2018    RARE WBC PRESENT, PREDOMINANTLY PMN FEW GRAM POSITIVE COCCI IN PAIRS RARE GRAM NEGATIVE RODS    CULT  06/10/2018    FEW MORGANELLA MORGANII MODERATE BACTEROIDES THETAIOTAOMICRON BETA LACTAMASE POSITIVE Performed at [facility], [place] [address] [place], [place] [address]    [place] MORGANII 06/10/2018    Lab Results  Component Value Date   ALBUMIN 2.8 (L) 10/04/2019   ALBUMIN 2.9 (L) 06/04/2019   ALBUMIN 2.8 (L) 06/03/2019    Neurologic: Patient does not have protective sensation bilateral lower extremities.   MUSCULOSKELETAL:   Skin: Examination patient has a palpable dorsalis pedis pulse he has a Charcot rocker-bottom deformity but no active process.  He has a Wagner grade 1 ulcer on the plantar aspect the left foot this is 10 mm in diameter has healthy granulation tissue at the base with good bleeding does not probe to bone or tendon.  Radiographs show Charcot arthropathy through the midfoot.  No radiographic signs of osteomyelitis.  Radiographs shows Charcot  Assessment: Diabetic insensate neuropathy with Charcot collapse of the left foot with a Wagner plantar grade 1 ulcer  Plan: Plan: We will have a dry dressing applied postoperative shoe I will follow-up  in the office for wound care after discharge.  Thank you for the consult and the opportunity to see Mr. Rance Smithson, [place] [phone] 2:06 PM

## 2019-10-16 NOTE — ED Notes (Signed)
PT vomited x1 PRN given

## 2019-10-17 ENCOUNTER — Encounter (HOSPITAL_COMMUNITY): Payer: BC Managed Care – PPO

## 2019-10-17 DIAGNOSIS — I509 Heart failure, unspecified: Secondary | ICD-10-CM | POA: Diagnosis not present

## 2019-10-17 LAB — D-DIMER, QUANTITATIVE: D-Dimer, Quant: 13.94 ug/mL-FEU — ABNORMAL HIGH (ref 0.00–0.50)

## 2019-10-17 LAB — CBC
HCT: 31 % — ABNORMAL LOW (ref 39.0–52.0)
Hemoglobin: 9.4 g/dL — ABNORMAL LOW (ref 13.0–17.0)
MCH: 29.8 pg (ref 26.0–34.0)
MCHC: 30.3 g/dL (ref 30.0–36.0)
MCV: 98.4 fL (ref 80.0–100.0)
Platelets: 146 10*3/uL — ABNORMAL LOW (ref 150–400)
RBC: 3.15 MIL/uL — ABNORMAL LOW (ref 4.22–5.81)
RDW: 16.8 % — ABNORMAL HIGH (ref 11.5–15.5)
WBC: 4 10*3/uL (ref 4.0–10.5)
nRBC: 0 % (ref 0.0–0.2)

## 2019-10-17 LAB — BASIC METABOLIC PANEL
Anion gap: 13 (ref 5–15)
BUN: 104 mg/dL — ABNORMAL HIGH (ref 6–20)
CO2: 21 mmol/L — ABNORMAL LOW (ref 22–32)
Calcium: 7.9 mg/dL — ABNORMAL LOW (ref 8.9–10.3)
Chloride: 108 mmol/L (ref 98–111)
Creatinine, Ser: 5.48 mg/dL — ABNORMAL HIGH (ref 0.61–1.24)
GFR calc Af Amer: 14 mL/min — ABNORMAL LOW (ref >60)
GFR calc non Af Amer: 12 mL/min — ABNORMAL LOW (ref >60)
Glucose, Bld: 107 mg/dL — ABNORMAL HIGH (ref 70–99)
Potassium: 4.7 mmol/L (ref 3.5–5.1)
Sodium: 142 mmol/L (ref 135–145)

## 2019-10-17 LAB — PROCALCITONIN: Procalcitonin: 0.75 ng/mL

## 2019-10-17 LAB — GLUCOSE, CAPILLARY
Glucose-Capillary: 97 mg/dL (ref 70–99)
Glucose-Capillary: 113 mg/dL — ABNORMAL HIGH (ref 70–99)
Glucose-Capillary: 113 mg/dL — ABNORMAL HIGH (ref 70–99)
Glucose-Capillary: 100 mg/dL — ABNORMAL HIGH (ref 70–99)

## 2019-10-17 LAB — MAGNESIUM: Magnesium: 2.1 mg/dL (ref 1.7–2.4)

## 2019-10-17 LAB — APTT
aPTT: 187 seconds (ref 24–36)
aPTT: 101 seconds — ABNORMAL HIGH (ref 24–36)

## 2019-10-17 LAB — BRAIN NATRIURETIC PEPTIDE: B Natriuretic Peptide: 1504.6 pg/mL — ABNORMAL HIGH (ref 0.0–100.0)

## 2019-10-17 LAB — HEPARIN LEVEL (UNFRACTIONATED): Heparin Unfractionated: >2.2 IU/mL — ABNORMAL HIGH (ref 0.30–0.70)

## 2019-10-17 LAB — C-REACTIVE PROTEIN: CRP: 5.7 mg/dL — ABNORMAL HIGH (ref <1.0)

## 2019-10-17 MED ORDER — METHYLPREDNISOLONE SODIUM SUCC 125 MG IJ SOLR
60.0000 mg | Freq: Two times a day (BID) | INTRAMUSCULAR | Status: DC
  Administered 2019-10-17: 09:00:00 60 mg via INTRAVENOUS
  Filled 2019-10-17: qty 2

## 2019-10-17 MED ORDER — METOLAZONE 5 MG PO TABS
5.0000 mg | ORAL_TABLET | Freq: Once | ORAL | Status: AC
  Administered 2019-10-17: 5 mg via ORAL
  Filled 2019-10-17: qty 1

## 2019-10-17 MED ORDER — FUROSEMIDE 10 MG/ML IJ SOLN
160.0000 mg | Freq: Four times a day (QID) | INTRAVENOUS | Status: DC
  Administered 2019-10-17 – 2019-10-18 (×5): 160 mg via INTRAVENOUS
  Filled 2019-10-17: qty 2
  Filled 2019-10-17 (×7): qty 16
  Filled 2019-10-17: qty 10

## 2019-10-17 MED ORDER — METOLAZONE 5 MG PO TABS
5.0000 mg | ORAL_TABLET | Freq: Once | ORAL | Status: AC
  Administered 2019-10-17: 14:00:00 5 mg via ORAL
  Filled 2019-10-17: qty 1

## 2019-10-17 MED ORDER — INSULIN ASPART 100 UNIT/ML ~~LOC~~ SOLN
0.0000 [IU] | Freq: Three times a day (TID) | SUBCUTANEOUS | Status: DC
  Administered 2019-10-18 – 2019-10-20 (×2): 2 [IU] via SUBCUTANEOUS
  Administered 2019-10-20: 3 [IU] via SUBCUTANEOUS
  Administered 2019-10-20 – 2019-10-21 (×2): 2 [IU] via SUBCUTANEOUS

## 2019-10-17 MED ORDER — SODIUM BICARBONATE 650 MG PO TABS
650.0000 mg | ORAL_TABLET | Freq: Three times a day (TID) | ORAL | Status: DC
  Administered 2019-10-17 – 2019-10-18 (×5): 650 mg via ORAL
  Filled 2019-10-17 (×5): qty 1

## 2019-10-17 MED ORDER — CHLORHEXIDINE GLUCONATE CLOTH 2 % EX PADS
6.0000 | MEDICATED_PAD | Freq: Every day | CUTANEOUS | Status: DC
  Administered 2019-10-17 – 2019-10-30 (×11): 6 via TOPICAL

## 2019-10-17 MED ORDER — ALBUMIN HUMAN 25 % IV SOLN
25.0000 g | Freq: Once | INTRAVENOUS | Status: AC
  Administered 2019-10-17: 25 g via INTRAVENOUS
  Filled 2019-10-17: qty 100

## 2019-10-17 MED ORDER — ALUM & MAG HYDROXIDE-SIMETH 200-200-20 MG/5ML PO SUSP
30.0000 mL | Freq: Three times a day (TID) | ORAL | Status: DC | PRN
  Administered 2019-10-17 (×2): 30 mL via ORAL
  Filled 2019-10-17 (×2): qty 30

## 2019-10-17 MED ORDER — INSULIN ASPART 100 UNIT/ML ~~LOC~~ SOLN: 0.0000 [IU] | Freq: Every day | SUBCUTANEOUS | Status: DC

## 2019-10-17 MED ORDER — SODIUM CHLORIDE 0.9 % IV SOLN
100.0000 mg | Freq: Every day | INTRAVENOUS | Status: AC
  Administered 2019-10-18 – 2019-10-21 (×4): 100 mg via INTRAVENOUS
  Filled 2019-10-17 (×4): qty 20

## 2019-10-17 MED ORDER — METHYLPREDNISOLONE SODIUM SUCC 40 MG IJ SOLR
40.0000 mg | Freq: Every day | INTRAMUSCULAR | Status: DC
  Administered 2019-10-18 – 2019-10-19 (×2): 40 mg via INTRAVENOUS
  Filled 2019-10-17 (×2): qty 1

## 2019-10-17 MED ORDER — HEPARIN (PORCINE) 25000 UT/250ML-% IV SOLN
1800.0000 [IU]/h | INTRAVENOUS | Status: DC
  Administered 2019-10-17 (×2): 1100 [IU]/h via INTRAVENOUS
  Administered 2019-10-18 – 2019-10-19 (×2): 900 [IU]/h via INTRAVENOUS
  Administered 2019-10-20 – 2019-10-22 (×3): 1300 [IU]/h via INTRAVENOUS
  Administered 2019-10-24: 1750 [IU]/h via INTRAVENOUS
  Administered 2019-10-25: 1900 [IU]/h via INTRAVENOUS
  Administered 2019-10-26 – 2019-10-29 (×5): 1700 [IU]/h via INTRAVENOUS
  Administered 2019-10-29: 1850 [IU]/h via INTRAVENOUS
  Filled 2019-10-17 (×20): qty 250

## 2019-10-17 MED ORDER — SODIUM CHLORIDE 0.9 % IV SOLN
200.0000 mg | Freq: Once | INTRAVENOUS | Status: AC
  Administered 2019-10-17: 200 mg via INTRAVENOUS
  Filled 2019-10-17: qty 40

## 2019-10-17 MED ORDER — LOPERAMIDE HCL 2 MG PO CAPS
4.0000 mg | ORAL_CAPSULE | Freq: Once | ORAL | Status: AC
  Administered 2019-10-17: 4 mg via ORAL
  Filled 2019-10-17: qty 2

## 2019-10-17 NOTE — Evaluation (Signed)
Physical Therapy Evaluation Patient Details Name: GARIN MATA MRN: [id number] DOB: [birth year] Today's Date: 10/17/2019   History of Present Illness  44 year old male admitted 09/27/2019 with progressive increase in LE edema and redness, abdominal girth, DOE, orthopnea, and weight gain. He recently ran out of his Torsemide approx 3 weeks ago. Patient also +COVID. 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 Orthopedics consulted for chronic L foot plantar ulcer. MRI did not show signs of osteomyelitis. No signs of cellulitis. Orthopedics noting patient withWagner grade 1 ulcer plantar aspect L foot with Charcot arthropathy. Orthopedics recommending dry dressing and postoperative shoe. Confirmed with Dr. Sharol Given via secure chat 10/17/19 TDWB with post-op shoe L foot, "WBAT is okay but TDWB is better." Nephrology consulted for AKI/CKD. 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 Atrial fibrillation on Eliquis    Clinical Impression  Patient presents with decreased overall strength, impaired activity tolerance, and is below his PLOF. Once patient with less fluid on him, will attempt to sit EOB with 3 person assist. Recommend continued skilled PT services and pending mobility progress in the hospital, SNF for short term rehabilitation and wound care at time of discharge.    Follow Up Recommendations SNF;Other (comment);Supervision/Assistance - 24 hour((pending mobility progress in the hospital))    Equipment Recommendations  Other (comment);Hospital bed(TBD at next level of care)    Recommendations for Other Services OT consult     Precautions / Restrictions Precautions Precautions: Fall;Other (comment) Precaution Comments: NPO on 10/17/19 AM Required Braces or Orthoses: Other  Brace Other Brace: post-op shoe Restrictions Weight Bearing Restrictions: Yes LLE Weight Bearing: Touchdown weight bearing Other Position/Activity Restrictions: with post-op shoe      Mobility  Bed Mobility Overal bed mobility: Needs Assistance Bed Mobility: Rolling Rolling: (per nursing, 3 person assist to roll)  General bed mobility comments: Partial rolling bilaterally with maxA. Per discussion with nursing staff, patient requires 3 person assist for rolling bilaterally for care. Patient also with speciality bed/mattress increasing difficulty with bed mobility along with extra fluid on him. Assisted patient with increasing HOB to decrease discomfort due to indigestion.  Transfers    General transfer comment: not safe to attempt at this time, would be hoyer lift OOB     Balance      Pertinent Vitals/Pain Pain Assessment: Faces Faces Pain Scale: Hurts little more Pain Location: indegestion making it difficult to talk Pain Descriptors / Indicators: Burning Pain Intervention(s): Limited activity within patient's tolerance;Monitored during session    Long expects to be discharged to:: Private residence Living Arrangements: Children;Spouse/significant other((girlfriend and minor children)) Available Help at Discharge: Other (Comment)(girlfriend home during the day) Type of Home: Apartment Home Access: Stairs to enter Entrance Stairs-Rails: Right Entrance Stairs-Number of Steps: 1 Home Layout: One level Home Equipment: Shower seat;Wheelchair - [employer] - 2 wheels;Bedside commode      Prior Function Level of Independence: Independent   Comments: +work, +drive, independent with mobility without AD, electric scooter for grocery shopping, no home O2        Extremity/Trunk Assessment        Lower Extremity Assessment Lower Extremity Assessment: LLE deficits/detail;RLE deficits/detail RLE Deficits / Details: grossly 1 - 2-/5 except ankle  >/=2/5 RLE Sensation: (grossly intact to light touch) LLE Deficits / Details: grossly 1 -  2-/5 except ankle >/=2/5 LLE Sensation: decreased light touch       Communication   Communication: No difficulties  Cognition Arousal/Alertness: Awake/alert Behavior During Therapy: WFL for tasks assessed/performed Overall Cognitive Status: Within Functional Limits for tasks assessed    General Comments: disoriented to time of day, oreinted to month      General Comments General comments (skin integrity, edema, etc.): Patient on 5L HFNC with oxygen saturation 90-91% at rest, RR 7, HR 84 bpm. Oxygen saturation down to 85% with just bed level therex.    Exercises General Exercises - Lower Extremity Ankle Circles/Pumps: AROM;10 reps;Both;Supine Quad Sets: Both;10 reps;Supine;Other (comment)(isometric) Gluteal Sets: Both;10 reps;Supine;Other (comment)(isometric) Heel Slides: AAROM;10 reps;Both;Supine Hip ABduction/ADduction: AAROM;5 reps;Supine;Both;Limitations Hip Abduction/Adduction Limitations: limited ability/range, patient reports pins in bilat hips from his youth (middle school) Other Exercises Other Exercises: incentive spirometer x 5, max 742mL   Assessment/Plan    PT Assessment Patient needs continued PT services  PT Problem List Decreased strength;Decreased activity tolerance;Decreased balance;Decreased mobility;Cardiopulmonary status limiting activity;Decreased knowledge of precautions;Impaired sensation;Obesity       PT Treatment Interventions DME instruction;Gait training;Functional mobility training;Therapeutic activities;Therapeutic exercise;Stair training;Balance training;Patient/family education;Wheelchair mobility training    PT Goals (Current goals can be found in the Care Plan section)  Acute Rehab PT Goals Time For Goal Achievement: 10/30/19 Potential to Achieve Goals: Good    Frequency Min 2X/week        AM-PAC PT "6 Clicks" Mobility  Outcome Measure Help  needed turning from your back to your side while in a flat bed without using bedrails?: A Lot Help needed moving from lying on your back to sitting on the side of a flat bed without using bedrails?: Total Help needed moving to and from a bed to a chair (including a wheelchair)?: Total Help needed standing up from a chair using your arms (e.g., wheelchair or bedside chair)?: Total Help needed to walk in hospital room?: Total Help needed climbing 3-5 steps with a railing? : Total 6 Click Score: 7    End of Session Equipment Utilized During Treatment: Oxygen Activity Tolerance: Patient limited by fatigue Patient left: in bed;with call bell/phone within reach Nurse Communication: Mobility status PT Visit Diagnosis: Unsteadiness on feet (R26.81);Other abnormalities of gait and mobility (R26.89)    Time: 5397-6734 PT Time Calculation (min) (ACUTE ONLY): 31 min   Charges:   PT Evaluation $PT Eval Moderate Complexity: 1 Mod         Birdie Hopes, PT, DPT Acute Rehab [phone] office    Birdie Hopes 10/17/2019, 2:03 PM

## 2019-10-17 NOTE — Progress Notes (Addendum)
Advanced Heart Failure Rounding Note  PCP-Cardiologist: No primary care provider on file.   Subjective:    Poor diuresis from IV lasix +metolazone.   Refuses dialysis today. SOB with exertion.   Objective:   Weight Range: 136 kg Body mass index is 41.82 kg/m.   Vital Signs:   Temp:  [98 F (36.7 C)-99.1 F (37.3 C)] 98 F (36.7 C) (04/29 1603) Pulse Rate:  [79-92] 86 (04/29 1603) Resp:  [2-32] 19 (04/29 1603) BP: (111-139)/(63-99) 139/79 (04/29 1603) SpO2:  [67 %-97 %] 93 % (04/29 1603) Last BM Date: 10/17/19  Weight change: Filed Weights   10/15/19 1143  Weight: 136 kg    Intake/Output:   Intake/Output Summary (Last 24 hours) at 10/17/2019 1614 Last data filed at 10/17/2019 1600 Gross per 24 hour  Intake 792.29 ml  Output 200 ml  Net 592.29 ml      Physical Exam    General:  No resp difficulty HEENT: normal Neck: supple. JVD to jaw. Carotids 2+ bilat; no bruits. No lymphadenopathy or thryomegaly appreciated. Cor: PMI nondisplaced. Regular rate & rhythm. No rubs, gallops or murmurs. Lungs: clear Abdomen: soft, nontender, nondistended. No hepatosplenomegaly. No bruits or masses. Good bowel sounds. Extremities: no cyanosis, clubbing, rash, R and LLE 1-2+edema Neuro: alert & orientedx3, cranial nerves grossly intact. moves all 4 extremities w/o difficulty. Affect pleasant   Telemetry  NSR   EKG    N/a   Labs    CBC Recent Labs    10/16/19 0406 10/17/19 0413  WBC 4.5 4.0  HGB 9.7* 9.4*  HCT 31.4* 31.0*  MCV 97.8 98.4  PLT 152 841*   Basic Metabolic Panel Recent Labs    10/16/19 0406 10/17/19 0413  NA 141 142  K 4.7 4.7  CL 108 108  CO2 18* 21*  GLUCOSE 118* 107*  BUN 100* 104*  CREATININE 5.13* 5.48*  CALCIUM 8.1* 7.9*  MG 2.0 2.1  PHOS 6.5*  --    Liver Function Tests Recent Labs    10/06/2019 2206  AST 37  ALT 17  ALKPHOS 90  BILITOT 1.3*  PROT 7.3  ALBUMIN 2.8*   Recent Labs    10/18/2019 2206  LIPASE 24    Cardiac Enzymes No results for input(s): CKTOTAL, CKMB, CKMBINDEX, TROPONINI in the last 72 hours.  BNP: BNP (last 3 results) Recent Labs    05/27/19 0242 10/15/19 1107 10/17/19 0413  BNP 1,403.3* 1,305.3* 1,504.6*    ProBNP (last 3 results) No results for input(s): PROBNP in the last 8760 hours.   D-Dimer Recent Labs    10/16/19 0406 10/17/19 0413  DDIMER 13.60* 13.94*   Hemoglobin A1C Recent Labs    10/15/19 1702  HGBA1C 6.3*   Fasting Lipid Panel No results for input(s): CHOL, HDL, LDLCALC, TRIG, CHOLHDL, LDLDIRECT in the last 72 hours. Thyroid Function Tests No results for input(s): TSH, T4TOTAL, T3FREE, THYROIDAB in the last 72 hours.  Invalid input(s): FREET3  Other results:   Imaging    No results found.   Medications:     Scheduled Medications: . amiodarone  200 mg Oral Daily  . atorvastatin  10 mg Oral Q supper  . carvedilol  25 mg Oral BID WC  . Chlorhexidine Gluconate Cloth  6 each Topical Daily  . ferrous sulfate  325 mg Oral BID WC  . insulin aspart  0-15 Units Subcutaneous TID WC  . insulin aspart  0-5 Units Subcutaneous QHS  . isosorbide-hydrALAZINE  1 tablet Oral  BID  . [START ON 10/18/2019] methylPREDNISolone (SOLU-MEDROL) injection  40 mg Intravenous Daily  . multivitamin with minerals  1 tablet Oral Daily  . pantoprazole  40 mg Oral Daily  . sodium bicarbonate  650 mg Oral TID  . sodium chloride flush  3 mL Intravenous Q12H    Infusions: . furosemide 160 mg (10/17/19 1332)  . heparin 1,100 Units/hr (10/17/19 1010)  . [START ON 10/18/2019] remdesivir 100 mg in NS 100 mL      PRN Medications: acetaminophen, alum & mag hydroxide-simeth, gabapentin, LORazepam, ondansetron (ZOFRAN) IV     Assessment/Plan    1. A/C Systolic Heart Failure  ECHO 08/2018 EF 20-25% and normal RV. EF has been down since 2013.  Suspected NICM. [place] 2013 with nonobstructive cad.  Acute decompensation in the setting of no medications for the last  month. He never returned for HF follow up since his last admit 05/2019 - CXR with pulmonary congestion + COVID 19 -  No spiro , dig, arni with creatinine 5.5  - Sluggish diuresis with lasix drip + metolazone.  - Nephrology consulted.  Diuretics adjusted.   2. L foot, plantar wound  - Moderate exudate + odor - WBC normal.   -Xray of foot -> No suggestive of osteo.  Will need WOC  3. RLE Multiple open wounds  Moderate serous exudate/+ odor  - Hopefully this will improve with diuresis and wound care.   4.  CKD Stage IV  Creatinine on admit 4.8 -->5.1-->5.48  - Nephrology consulted. He refused dialysis.  - Diuretics adjusted by Nephrology.    5. PAF -maintaining NSR.  -Continue  amio 200 mg daily - Off eliquis. On Heparin dripStart eliquis 5 mg twice a day   6. DMII Non compliant witth meds. Hgb A1C 6.3   7. COVID 19  +SARS2 D-Dimer 15>13   8. Poor Literacy  Nephrology adjusting diuretics. Not much to offer from HF perspective.    He is not a candidate for advanced therapies with CKD Stage IV and ongoing noncompliance.  He was discharged from HF Paramedicine 08/2019 because he wound not answer the phone for follow up.    Length of Stay: 2  Darrick Grinder, NP  10/17/2019, 4:14 PM  Advanced Heart Failure Team Pager [phone] (M-F; 7a - 4p)  Please contact [facility] for night-coverage after hours (4p -7a ) and weekends on [email or web address]  Agree with above note.   Creatinine continues to rise with poor response to Lasix gtt 20 mg/hr + metolazone.  Seen by nephrology, he refuses HD at this time.   1. Acute on chronic systolic CHF: Nonischemic cardiomyopathy (long-standing, cath in 2013 with 50% mLAD stenosis).  Echo this admission with EF 20-25%, moderate RV dilation, mild-moderately dilated RV with severe systolic dysfunction. Complicated by CKD stage 4. He has been noncompliant with meds and followup, out of meds for uncertain period. NYHA class IV symptoms. Marked  volume overload. He also has COVID-19 viral pneumonitis, but suspect main issue is volume overload. Creatinine up to 5.48. He is not responding to high dose diuretics.  Seen by renal, refuses HD.  - Now on high dose bolus Lasix + metolazone per renal.  If he does not respond to this, which I suspect he will not, options will be HD or palliative (currently refusing HD). Regardless, with severe biventricular failure, I suspect that he will not tolerate HD well.  - Continue Bidil 1 tab tid. BP stable.  - Coreg 25 mg bid 2. CKD:  stage IV. Creatinine mildly higher at 5.47 with poor diuresis in setting of marked volume overload.  I suspect that he is not going to respond enough to IV diuretics and will need HD for volume control, but he is refusing.  3. LE wounds: Draining wounds on feet. Foot XR not suggestive of osteomyelitis. Charcot arthropathy.  - Wound consult appreciated.  4. Atrial fibrillation: Paroxysmal. He is in NSR.  - Continue amiodarone 200 daily.  - Heparin gtt (apixaban on hold in case procedures needed).  5. COVID-19 viral pneumonitis: Suspect this is not the main cause of his dyspnea (think volume overload in setting of CKD stage 4, cardiomyopathy, and noncompliance).  At this point, we will follow at a distance.  I think that he will either need to agree to HD or proceed to palliative care.  Current diuretics being adjusted by nephrology. Please call with further questions.   Loralie Champagne 10/17/2019

## 2019-10-17 NOTE — Progress Notes (Signed)
PROGRESS NOTE                                                                                                                                                                                                             Patient Demographics:    Joshua Massey, is a 44 y.o. male, DOB - 1975/07/07, EHM:[id number]  Outpatient Primary MD for the patient is [name], [name], MD    LOS - 2  Admit date - 10/16/2019    Chief Complaint  Patient presents with  . Leg Swelling  . Abdominal Pain       Brief Narrative  - Joshua Massey is a 44 y.o. male with medical history significant of IDDM, HTN, and chronic systolic heart failure EF 20-25%,CKD stage IV, morbid obesity, poor compliance with medications, presented with increasing redness and increasing short of breath for 3 weeks, he ran out of his diuretics few weeks ago.  In the ER he was diagnosed with acute on chronic combined systolic diastolic heart failure with massive fluid overload, AKI on CKD 4, incidental Covid infection and admitted to the hospital.   Subjective:   Patient in bed, appears comfortable, denies any headache, no fever, no chest pain or pressure, +ve shortness of breath , no abdominal pain. No focal weakness.   Assessment  & Plan :      1. Acute Hypoxic Resp. Failure due to severe acute on chronic combined systolic and diastolic heart failure with EF of 25%.  He has currently been placed on Lasix drip which has been increased on 10/17/2019 after discussions with the cardiology team, also gave him high-dose metolazone on 10/17/2019, urine output has been very low, he has massive fluid overload, cardiology following, been placed on salt and fluid restriction, monitor intake output and renal function along with electrolytes.  He is at least 25 to 30 L positive.  Continue combination of amiodarone, Coreg, BiDil and diuretics.  Renal function precludes the use of ACE/ARB  or Entresto.   2. Acute Covid 19 Viral Pneumonitis during the ongoing 2020 Covid 19 Pandemic -this seems to be incidental finding will monitor. Encouraged the patient to sit up in chair in the daytime use I-S and flutter valve for pulmonary toiletry and then prone in bed when at night.  Will advance activity and titrate down oxygen as possible.  SpO2: 96 % O2 Flow Rate (L/min): 6 L/min  Recent Labs  Lab 10/15/19 1107 10/15/19 1201 10/15/19 1702 10/16/19 0406 10/16/19 0733 10/17/19 0413  CRP  --   --  5.1* 5.2*  --  5.7*  DDIMER  --   --  15.09* 13.60*  --  13.94*  BNP 1,305.3*  --   --   --   --  1,504.6*  PROCALCITON  --   --  0.61  --  0.68 0.75  SARSCOV2NAA  --  POSITIVE*  --   --   --   --     Hepatic Function Latest Ref Rng & Units 09/22/2019 06/04/2019 06/03/2019  Total Protein 6.5 - 8.1 g/dL 7.3 - -  Albumin 3.5 - 5.0 g/dL 2.8(L) 2.9(L) 2.8(L)  AST 15 - 41 U/L 37 - -  ALT 0 - 44 U/L 17 - -  Alk Phosphatase 38 - 126 U/L 90 - -  Total Bilirubin 0.3 - 1.2 mg/dL 1.3(H) - -  Bilirubin, Direct 0.0 - 0.2 mg/dL - - -    3.  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  No signs of osteomyelitis on MRI, no signs of cellulitis, orthopedics Dr. Sharol Given following.  Will monitor.  He will be high risk candidate for surgery right now as his CHF is decompensated and he is in massive fluid overload.  4.  AKI on CKD 4.  Baseline creatinine close to 4.7.  Somewhat oliguric despite high-dose diuretics, renal called may end up on dialysis.  5.  Dyslipidemia.  Continue statin.  6.  Essential hypertension.  Continue home dose Coreg and BiDil combination along with diuretics for now.  Monitor and adjust.   7.  Paroxysmal atrial fibrillation [place] vas 2 score of greater than 3.  For now on amiodarone and beta-blocker for rate control, heparin drip.  Will monitor.  Cardiology on board.   8.  Anemia of chronic disease.  No signs of active bleeding, anemia panel, oral iron supplement, PPI and  monitor.    Condition - Fair  Family Communication  :  Mother [phone] on 10/17/19 at 9:35 AM.  Message left. [name] [phone] on 10/17/2019  Code Status :  Full  Diet :   Diet Order            Diet NPO time specified Except for: Sips with Meds  Diet effective now               Disposition Plan  : Stay in the inpatient hospital for severe acute on chronic combined systolic and diastolic CHF, AKI on CKD 4, COVID-19 infection.  Thereafter discharge home once medically stable.  Status is: Inpatient  Remains inpatient appropriate because:IV treatments appropriate due to intensity of illness or inability to take PO   Dispo: The patient is from: Home              Anticipated d/c is to: Home              Anticipated d/c date is: > 3 days              Patient currently is not medically stable to d/c.   Consults  :  Cards, Ortho, Renal  Procedures  :    MRI left foot.  No osteomyelitis. Charcot joints.  TTE.  1. Severe global reduction in LV systolic function; restrictive filling; 4 chamber enlargement; severe RV dysfunction; mild MR; moderate TR; moderate pulmonary hypertension; small pericardial effusion. 2. Left  ventricular ejection fraction, by estimation, is 20 to 25%. The left ventricle has severely decreased function. The left ventricle demonstrates global hypokinesis. The left ventricular internal cavity size was moderately dilated. There is mild left ventricular hypertrophy. Left ventricular diastolic parameters are consistent with Grade III diastolic dysfunction (restrictive).  3. Right ventricular systolic function is severely reduced. The right ventricular size is mildly enlarged. There is moderately elevated pulmonary artery systolic pressure.  4. Left atrial size was mildly dilated.  5. Right atrial size was mildly dilated.  6. The mitral valve is normal in structure. Mild mitral valve regurgitation. No evidence of mitral stenosis.  7. Tricuspid  valve regurgitation is moderate.  8. The aortic valve has an indeterminant number of cusps. Aortic valve regurgitation is not visualized. No aortic stenosis is present.  9. The inferior vena cava is dilated in size with <50% respiratory variability, suggesting right atrial pressure of 15 mmHg.  PUD Prophylaxis :   DVT Prophylaxis  :   Heparin  Lab Results  Component Value Date   PLT 146 (L) 10/17/2019    Inpatient Medications  Scheduled Meds: . amiodarone  200 mg Oral Daily  . atorvastatin  10 mg Oral Q supper  . carvedilol  25 mg Oral BID WC  . ferrous sulfate  325 mg Oral BID WC  . insulin aspart  0-15 Units Subcutaneous TID WC  . insulin aspart  0-5 Units Subcutaneous QHS  . isosorbide-hydrALAZINE  1 tablet Oral BID  . [START ON 10/18/2019] methylPREDNISolone (SOLU-MEDROL) injection  40 mg Intravenous Daily  . metolazone  5 mg Oral Once  . multivitamin with minerals  1 tablet Oral Daily  . pantoprazole  40 mg Oral Daily  . sodium bicarbonate  650 mg Oral TID  . sodium chloride flush  3 mL Intravenous Q12H   Continuous Infusions: . furosemide (LASIX) infusion 12 mg/hr (10/15/19 1520)  . heparin 1,100 Units/hr (10/17/19 0702)  . remdesivir 200 mg in sodium chloride 0.9% 250 mL IVPB     Followed by  . [START ON 10/18/2019] remdesivir 100 mg in NS 100 mL     PRN Meds:.acetaminophen, alum & mag hydroxide-simeth, gabapentin, LORazepam, ondansetron (ZOFRAN) IV  Antibiotics  :    Anti-infectives (From admission, onward)   Start     Dose/Rate Route Frequency Ordered Stop   10/18/19 1000  remdesivir 100 mg in sodium chloride 0.9 % 100 mL IVPB     100 mg 200 mL/hr over 30 Minutes Intravenous Daily 10/17/19 0707 10/22/19 0959   10/17/19 0800  remdesivir 200 mg in sodium chloride 0.9% 250 mL IVPB     200 mg 580 mL/hr over 30 Minutes Intravenous Once 10/17/19 0626         Time Spent in minutes  30   Lala Lund M.D on 10/17/2019 at 9:30 AM  To page go to [email or web address]  - password [facility]  [facility] -  Office  [phone]   See all Orders from today for further details    Objective:   Vitals:   10/16/19 2010 10/17/19 0000 10/17/19 0030 10/17/19 0400  BP:  114/63  115/70  Pulse: 92 86 83 89  Resp: 15 (!) 32 15 16  Temp:      TempSrc:      SpO2: 97% 96% 94% 96%  Weight:      Height:        Wt Readings from Last 3 Encounters:  10/15/19 136 kg  06/04/19 (!) 136.4 kg  12/10/18 Marland Kitchen)  142.9 kg     Intake/Output Summary (Last 24 hours) at 10/17/2019 0930 Last data filed at 10/16/2019 1600 Gross per 24 hour  Intake 293.94 ml  Output 150 ml  Net 143.94 ml     Physical Exam  Awake Alert, No new F.N deficits, Normal affect [place].AT,PERRAL Supple Neck,No JVD, No cervical lymphadenopathy appriciated.  Symmetrical Chest wall movement, Good air movement bilaterally, few rales RRR,No Gallops, Rubs or new Murmurs, No Parasternal Heave +ve B.Sounds, Abd Soft, No tenderness, No organomegaly appriciated, No rebound - guarding or rigidity.  3 + edema in the scrotum and lower extremities, left foot plantar ulcer noted, no pus or surrounding infection, few R Leg open sores    Data Review:    CBC Recent Labs  Lab 10/16/2019 2206 10/16/19 0406 10/17/19 0413  WBC 4.5 4.5 4.0  HGB 10.2* 9.7* 9.4*  HCT 33.0* 31.4* 31.0*  PLT 175 152 146*  MCV 98.5 97.8 98.4  MCH 30.4 30.2 29.8  MCHC 30.9 30.9 30.3  RDW 16.4* 16.5* 16.8*    Chemistries  Recent Labs  Lab 10/18/2019 2206 10/15/19 1702 10/16/19 0406 10/17/19 0413  NA 141  --  141 142  K 4.6  --  4.7 4.7  CL 108  --  108 108  CO2 19*  --  18* 21*  GLUCOSE 103*  --  118* 107*  BUN 95*  --  100* 104*  CREATININE 4.81*  --  5.13* 5.48*  CALCIUM 8.3*  --  8.1* 7.9*  AST 37  --   --   --   ALT 17  --   --   --   ALKPHOS 90  --   --   --   BILITOT 1.3*  --   --   --   MG  --   --  2.0 2.1  HGBA1C  --  6.3*  --   --        ------------------------------------------------------------------------------------------------------------------ No results for input(s): CHOL, HDL, LDLCALC, TRIG, CHOLHDL, LDLDIRECT in the last 72 hours.  Lab Results  Component Value Date   HGBA1C 6.3 (H) 10/15/2019   ------------------------------------------------------------------------------------------------------------------ No results for input(s): TSH, T4TOTAL, T3FREE, THYROIDAB in the last 72 hours.  Invalid input(s): FREET3  Cardiac Enzymes No results for input(s): CKMB, TROPONINI, MYOGLOBIN in the last 168 hours.  Invalid input(s): CK ------------------------------------------------------------------------------------------------------------------    Component Value Date/Time   BNP 1,504.6 (H) 10/17/2019 0413   BNP 15.0 01/01/2013 1212    Micro Results Recent Results (from the past 240 hour(s))  Respiratory Panel by RT PCR (Flu A&B, Covid) - Nasopharyngeal Swab     Status: Abnormal   Collection Time: 10/15/19 12:01 PM   Specimen: Nasopharyngeal Swab  Result Value Ref Range Status   SARS Coronavirus 2 by RT PCR POSITIVE (A) NEGATIVE Final    Comment: RESULT CALLED TO, READ BACK BY AND VERIFIED WITH: Benard Halsted RN 14:30 10/15/19 ([email or web address]) (NOTE) SARS-CoV-2 target nucleic acids are DETECTED. SARS-CoV-2 RNA is generally detectable in upper respiratory specimens  during the acute phase of infection. Positive results are indicative of the presence of the identified virus, but do not rule out bacterial infection or co-infection with other pathogens not detected by the test. Clinical correlation with patient history and other diagnostic information is necessary to determine patient infection status. The expected result is Negative. Fact Sheet for Patients:  PinkCheek.be Fact Sheet for Healthcare Providers: GravelBags.it This test is not yet approved or cleared  by the [place] FDA  and  has been authorized for detection and/or diagnosis of SARS-CoV-2 by FDA under an Emergency Use Authorization (EUA).  This EUA will remain in effect (meaning this test can be used) f or the duration of  the COVID-19 declaration under Section 564(b)(1) of the Act, 21 U.S.C. section 360bbb-3(b)(1), unless the authorization is terminated or revoked sooner.    Influenza A by PCR NEGATIVE NEGATIVE Final   Influenza B by PCR NEGATIVE NEGATIVE Final    Comment: (NOTE) The Xpert Xpress SARS-CoV-2/FLU/RSV assay is intended as an aid in  the diagnosis of influenza from Nasopharyngeal swab specimens and  should not be used as a sole basis for treatment. Nasal washings and  aspirates are unacceptable for Xpert Xpress SARS-CoV-2/FLU/RSV  testing. Fact Sheet for Patients: PinkCheek.be Fact Sheet for Healthcare Providers: GravelBags.it This test is not yet approved or cleared by the [place] FDA and  has been authorized for detection and/or diagnosis of SARS-CoV-2 by  FDA under an Emergency Use Authorization (EUA). This EUA will remain  in effect (meaning this test can be used) for the duration of the  Covid-19 declaration under Section 564(b)(1) of the Act, 21  U.S.C. section 360bbb-3(b)(1), unless the authorization is  terminated or revoked. Performed at [facility], [place] [address], [place], [place] [address]   MRSA PCR Screening     Status: None   Collection Time: 10/16/19  3:12 PM   Specimen: Nasal Mucosa; Nasopharyngeal  Result Value Ref Range Status   MRSA by PCR NEGATIVE NEGATIVE Final    Comment:        The GeneXpert MRSA Assay (FDA approved for NASAL specimens only), is one component of a comprehensive MRSA colonization surveillance program. It is not intended to diagnose MRSA infection nor to guide or monitor treatment for MRSA infections. Performed at [facility],  [place] [address] [place], [place] [address]     Radiology Reports MR FOOT LEFT WO CONTRAST  Result Date: 10/15/2019 CLINICAL DATA:  Bilateral lower extremity swelling, question of osteomyelitis ulcer on the bottom of the left forefoot EXAM: MRI OF THE LEFT FOOT WITHOUT CONTRAST TECHNIQUE: Multiplanar, multisequence MR imaging of the left was performed. No intravenous contrast was administered. COMPARISON:  Multiple radiographs, most recent radiograph same day FINDINGS: Bones/Joint/Cartilage Again noted are chronic erosive type changes seen at the base of the first MTP joint there is stable dorsal on lateral subluxation of the metatarsals at the TMT joint joints with erosive type changes and chronic disruption of the Lisfranc ligaments. There is small erosive type changes seen throughout the cuneiform. There is no acute marrow signal abnormality seen throughout the osseous structures however. No large joint effusions are present. Probable healed fracture deformity of the fifth metatarsal base seen. There is diffuse chronic periosteal reaction seen through the second through fourth metatarsal shafts. Ligaments Chronic disruptions of the Lisfranc ligaments as described above. Muscles and Tendons Diffuse fatty atrophy with denervation seen throughout the forefoot. The flexor and extensor tendons appear to be grossly intact, however there is surrounding fluid signal on surrounding the flexor digitorum tendons. Soft tissues On the medial plantar forefoot there is areas of superficial ulceration with diffuse skin thickening and subcutaneous edema seen. No loculated fluid collections or sinus tract however are noted. There is diffuse dorsal subcutaneous edema seen on the dorsum of the forefoot. IMPRESSION: 1. 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 No definite evidence acute osteomyelitis. 2.  Area superficial ulceration on the plantar base beneath  the first metatarsal. No definite loculated fluid collection or sinus tract. 3. Diffuse subcutaneous edema and cellulitis surrounding the forefoot. Electronically Signed   By: Prudencio Pair M.D.   On: 10/15/2019 22:27   DG Chest Port 1 View  Result Date: 10/15/2019 CLINICAL DATA:  Lower extremity edema EXAM: PORTABLE CHEST 1 VIEW COMPARISON:  05/22/2019 FINDINGS: Very low lung volumes limiting evaluation. Pulmonary vascular congestion with possible mild perihilar edema. No significant pleural effusion. No pneumothorax. Stable cardiomegaly. IMPRESSION: Pulmonary vascular congestion with possible mild edema. Electronically Signed   By: Macy Mis M.D.   On: 10/15/2019 09:11   DG Foot Complete Left  Result Date: 10/15/2019 CLINICAL DATA:  Nonhealing wound to left foot. EXAM: LEFT FOOT - COMPLETE 3+ VIEW COMPARISON:  August 29, 2018. FINDINGS: Vascular calcifications are noted. Continued chronic dislocations degenerative changes seen involving the tarsometatarsal joints. Stable defect of middle portion of second middle phalanx is noted. Diffuse soft tissue swelling is noted. No acute lytic destruction is seen to suggest osteomyelitis. IMPRESSION: Stable chronic findings as described above. No acute lytic destruction is seen to suggest osteomyelitis. Diffuse soft tissue swelling is noted. Electronically Signed   By: Marijo Conception M.D.   On: 10/15/2019 09:30   ECHOCARDIOGRAM COMPLETE  Result Date: 10/15/2019    ECHOCARDIOGRAM REPORT   Patient Name:   TRAYDEN BRANDY Date of Exam: 10/15/2019 Medical Rec #:  [id number]        Height:       71.0 in Accession #:    [id number]       Weight:       299.8 lb Date of Birth:  [birth year]        BSA:          2.504 m Patient Age:    77 years         BP:           144/99 mmHg Patient Gender: M                HR:           92 bpm. Exam Location:  Inpatient Procedure: 2D Echo, Cardiac Doppler and Color Doppler Indications:    CHF-Acute Systolic 182.99/B71.69  History:         Patient has prior history of Echocardiogram examinations. CHF,                 CAD; Risk Factors:Dyslipidemia. Non-ischemi cardiomyopathy. CKD.  Sonographer:    Clayton Lefort RDCS (AE) Referring Phys: [id number] Lequita Halt  Sonographer Comments: Patient is morbidly obese. IMPRESSIONS  1. Severe global reduction in LV systolic function; restrictive filling; 4 chamber enlargement; severe RV dysfunction; mild MR; moderate TR; moderate pulmonary hypertension; small pericardial effusion.  2. Left ventricular ejection fraction, by estimation, is 20 to 25%. The left ventricle has severely decreased function. The left ventricle demonstrates global hypokinesis. The left ventricular internal cavity size was moderately dilated. There is mild left ventricular hypertrophy. Left ventricular diastolic parameters are consistent with Grade III diastolic dysfunction (restrictive).  3. Right ventricular systolic function is severely reduced. The right ventricular size is mildly enlarged. There is moderately elevated pulmonary artery systolic pressure.  4. Left atrial size was mildly dilated.  5. Right atrial size was mildly dilated.  6. The mitral valve is normal in structure. Mild mitral valve regurgitation. No evidence of mitral stenosis.  7. Tricuspid valve regurgitation is moderate.  8.  The aortic valve has an indeterminant number of cusps. Aortic valve regurgitation is not visualized. No aortic stenosis is present.  9. The inferior vena cava is dilated in size with <50% respiratory variability, suggesting right atrial pressure of 15 mmHg. FINDINGS  Left Ventricle: Left ventricular ejection fraction, by estimation, is 20 to 25%. The left ventricle has severely decreased function. The left ventricle demonstrates global hypokinesis. The left ventricular internal cavity size was moderately dilated. There is mild left ventricular hypertrophy. Left ventricular diastolic parameters are consistent with Grade III diastolic dysfunction  (restrictive). Right Ventricle: The right ventricular size is mildly enlarged.Right ventricular systolic function is severely reduced. There is moderately elevated pulmonary artery systolic pressure. The tricuspid regurgitant velocity is 3.30 m/s, and with an assumed right atrial pressure of 15 mmHg, the estimated right ventricular systolic pressure is 51.7 mmHg. Left Atrium: Left atrial size was mildly dilated. Right Atrium: Right atrial size was mildly dilated. Pericardium: A small pericardial effusion is present. Mitral Valve: The mitral valve is normal in structure. Normal mobility of the mitral valve leaflets. Mild mitral valve regurgitation. No evidence of mitral valve stenosis. Tricuspid Valve: The tricuspid valve is normal in structure. Tricuspid valve regurgitation is moderate . No evidence of tricuspid stenosis. Aortic Valve: The aortic valve has an indeterminant number of cusps. Aortic valve regurgitation is not visualized. No aortic stenosis is present. Aortic valve mean gradient measures 3.2 mmHg. Aortic valve peak gradient measures 6.6 mmHg. Aortic valve area, by VTI measures 2.71 cm. Pulmonic Valve: The pulmonic valve was not well visualized. Pulmonic valve regurgitation is trivial. No evidence of pulmonic stenosis. Aorta: The aortic root is normal in size and structure. Venous: The inferior vena cava is dilated in size with less than 50% respiratory variability, suggesting right atrial pressure of 15 mmHg. IAS/Shunts: No atrial level shunt detected by color flow Doppler. Additional Comments: Severe global reduction in LV systolic function; restrictive filling; 4 chamber enlargement; severe RV dysfunction; mild MR; moderate TR; moderate pulmonary hypertension; small pericardial effusion.  LEFT VENTRICLE PLAX 2D LVIDd:         6.16 cm LVIDs:         5.03 cm LV PW:         1.38 cm LV IVS:        1.53 cm LVOT diam:     2.30 cm LV SV:         55 LV SV Index:   22 LVOT Area:     4.15 cm  LV Volumes (MOD)  LV vol d, MOD A2C: 318.0 ml LV vol d, MOD A4C: 215.0 ml LV vol s, MOD A2C: 232.0 ml LV vol s, MOD A4C: 177.0 ml LV SV MOD A2C:     86.0 ml LV SV MOD A4C:     215.0 ml LV SV MOD BP:      57.1 ml RIGHT VENTRICLE            IVC RV Basal diam:  4.02 cm    IVC diam: 3.59 cm RV Mid diam:    4.69 cm RV S prime:     6.31 cm/s TAPSE (M-mode): 0.8 cm LEFT ATRIUM             Index       RIGHT ATRIUM           Index LA diam:        5.20 cm 2.08 cm/m  RA Area:     23.90 cm LA Vol (A2C):  58.1 ml 23.20 ml/m RA Volume:   72.00 ml  28.75 ml/m LA Vol (A4C):   98.6 ml 39.37 ml/m LA Biplane Vol: 84.0 ml 33.54 ml/m  AORTIC VALVE AV Area (Vmax):    2.89 cm AV Area (Vmean):   2.79 cm AV Area (VTI):     2.71 cm AV Vmax:           128.60 cm/s AV Vmean:          85.780 cm/s AV VTI:            0.202 m AV Peak Grad:      6.6 mmHg AV Mean Grad:      3.2 mmHg LVOT Vmax:         89.40 cm/s LVOT Vmean:        57.620 cm/s LVOT VTI:          0.132 m LVOT/AV VTI ratio: 0.65  AORTA Ao Root diam: 3.30 cm Ao Asc diam:  3.60 cm TRICUSPID VALVE TR Peak grad:   43.6 mmHg TR Vmax:        330.00 cm/s  SHUNTS Systemic VTI:  0.13 m Systemic Diam: 2.30 cm Kirk Ruths MD Electronically signed by Kirk Ruths MD Signature Date/Time: 10/15/2019/4:19:33 PM    Final

## 2019-10-17 NOTE — Progress Notes (Signed)
[place] for IV Heparin Indication: atrial fibrillation  No Known Allergies  Patient Measurements: Height: 5\' 11"  (180.3 cm) Weight: 136 kg (299 lb 13.2 oz) IBW/kg (Calculated) : 75.3 Heparin Dosing Weight: 106 kg  Vital Signs: Temp: 98 F (36.7 C) (04/29 1603) Temp Source: Oral (04/29 1603) BP: 139/79 (04/29 1603) Pulse Rate: 86 (04/29 1603)  Labs: Recent Labs    09/27/2019 2206 09/20/2019 2206 10/15/19 1702 10/16/19 0406 10/16/19 1930 10/17/19 0413 10/17/19 1633  HGB 10.2*   < >  --  9.7*  --  9.4*  --   HCT 33.0*  --   --  31.4*  --  31.0*  --   PLT 175  --   --  152  --  146*  --   APTT  --   --   --   --  90* 187* 101*  HEPARINUNFRC  --   --   --   --  >2.20* >2.20*  --   CREATININE 4.81*  --   --  5.13*  --  5.48*  --   TROPONINIHS  --   --  41*  --   --   --   --    < > = values in this interval not displayed.    Estimated Creatinine Clearance: 24.7 mL/min (A) (by C-G formula based on SCr of 5.48 mg/dL (H)).   Assessment: 44 yr old male presented with severe fluid overload and was found to have COVID. He is on apixaban PTA for hx a fib (last dose on 4/27@1407 ). Scr continues to increase from 4.81 to 5.48 (pt refusing HD). Pharmacy was consulted to transition pt from apixaban to IV heparin.  Given recent apixaban dosing, will monitor both aPTT and heparin level until correlated.   aPTT ~9.5 hrs after heparin infusion held for 1 hr, then restarted at 1100 units/hr, was 101 sec, which is at the upper end of the goal range for this pt. CBC stable. Per RN, no issues with IV or bleeding observed.  Goal of Therapy:  Heparin level 0.3-0.7 units/ml aPTT 66-102 seconds Monitor platelets by anticoagulation protocol: Yes   Plan:  Reduce heparin infusion to 1050 units/hr Check 8-hr aPTT, heparin level Monitor daily aPTT, heparin level, CBC Monitor for signs/symptoms of bleeding  Gillermina Hu, PharmD, BCPS, [facility] Clinical  Pharmacist 10/17/2019, 6:01 PM

## 2019-10-17 NOTE — Progress Notes (Signed)
Orthopedic Tech Progress Note Patient Details:  Joshua Massey 01-31-1976 [id number]  Ortho Devices Type of Ortho Device: Louretta Parma boot Ortho Device/Splint Location: RLE Ortho Device/Splint Interventions: Ordered, Application   Post Interventions Patient Tolerated: Well Instructions Provided: Care of device   [name] 10/17/2019, 3:10 PM

## 2019-10-17 NOTE — Progress Notes (Signed)
ANTICOAGULATION CONSULT NOTE   Pharmacy Consult for heparin Indication: atrial fibrillation  No Known Allergies  Patient Measurements: Height: 5\' 11"  (180.3 cm) Weight: 136 kg (299 lb 13.2 oz) IBW/kg (Calculated) : 75.3 Heparin Dosing Weight: 106 kg  Vital Signs: BP: 115/70 (04/29 0400) Pulse Rate: 89 (04/29 0400)  Labs: Recent Labs    09/21/2019 2206 09/20/2019 2206 10/15/19 1702 10/16/19 0406 10/16/19 1930 10/17/19 0413  HGB 10.2*   < >  --  9.7*  --  9.4*  HCT 33.0*  --   --  31.4*  --  31.0*  PLT 175  --   --  152  --  146*  APTT  --   --   --   --  90* 187*  HEPARINUNFRC  --   --   --   --  >2.20* >2.20*  CREATININE 4.81*  --   --  5.13*  --  5.48*  TROPONINIHS  --   --  41*  --   --   --    < > = values in this interval not displayed.    Estimated Creatinine Clearance: 24.7 mL/min (A) (by C-G formula based on SCr of 5.48 mg/dL (H)).   Assessment: 60 yom presenting with severe fluid overload, found to have COVID. On apixaban PTA for hx Afib ([place] 4/27@1407 ). Scr continues to increase from 4.81 to 5.13. No s/sx of bleeding. Pharmacy asked to switch to heparin for now.  Given recent apixaban dosing, will monitor both aPTT and heparin level until correlate.   As expected Heparin level >2.2 likely due to apixaban dose, aPTT also elevated to 187 sec. RN confirmed that labs were drawn from arm opposite where heparin is running.  Goal of Therapy:  Heparin level 0.3-0.7 units/ml aPTT 66-102 seconds Monitor platelets by anticoagulation protocol: Yes   Plan:  Hold heparin x 1 hour Restart heparin gtt at 1100 units/hr Will f/u PTT 8 hrs after gtt restarted  Sherlon Handing, PharmD, BCPS Please see amion for complete clinical pharmacist phone list 10/17/2019, 6:05 AM

## 2019-10-17 NOTE — Plan of Care (Signed)
  Problem: Clinical Measurements: Goal: Respiratory complications will improve Outcome: Progressing   

## 2019-10-17 NOTE — Progress Notes (Signed)
CRITICAL VALUE ALERT  Critical Value:  aPTT 187  Date & Time Notied:  10/17/19 7782  Provider Notified: T. Opyd @0535   Orders Received/Actions taken: Pharmacy ordered pause of heparin for 1hr before restarting @11mL /hr

## 2019-10-17 NOTE — Consult Note (Signed)
Reason for Consult: AKI/CKD stage 4 Referring Physician: Candiss Norse, MD  Joshua Massey is an 44 y.o. male.  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 Atrial fibrillation on Eliquis, who presented with progressive increase in lower ext edema and redness, abdominal girth, DOE, orthopnea, and weight gain after he ran out of his torsemide.  He was also noted to be covid-19 +.  He was admitted with acute on chronic CHF and we were consulted to further evaluate and manage his AKI/CKD stage 4-5 presumably due to cardiorenal syndrome.  The trend in Scr is seen below.  Of note, he has had multiple admissions with a similar presentation, most recently on 05/22/19 at that time he declined to have vascular access placed in preparation for dialysis despite his advanced CKD.  He also failed to follow up in our office or with the heart failure clinic.  When speaking with him today about his worsening renal function and lack of response to a lasix drip, and likely need for dialysis he declined proceeding with HD catheter placement today.  He is not sure he would go on dialysis and understands the consequences.  However when asked if he was ok with dying of kidney failure he reluctantly stated, "not really".    Trend in Creatinine: Creatinine, Ser  Date/Time Value Ref Range Status  10/17/2019 04:13 AM 5.48 (H) 0.61 - 1.24 mg/dL Final  10/16/2019 04:06 AM 5.13 (H) 0.61 - 1.24 mg/dL Final  09/29/2019 10:06 PM 4.81 (H) 0.61 - 1.24 mg/dL Final  06/04/2019 03:07 AM 4.90 (H) 0.61 - 1.24 mg/dL Final  06/03/2019 02:51 AM 4.63 (H) 0.61 - 1.24 mg/dL Final  06/02/2019 02:37 AM 4.54 (H) 0.61 - 1.24 mg/dL Final  06/01/2019 02:49 AM 4.55 (H) 0.61 - 1.24 mg/dL Final  05/31/2019 03:05 AM 4.54 (H) 0.61 - 1.24  mg/dL Final  05/30/2019 03:14 AM 4.62 (H) 0.61 - 1.24 mg/dL Final  05/29/2019 01:51 AM 4.37 (H) 0.61 - 1.24 mg/dL Final  05/28/2019 02:56 AM 4.36 (H) 0.61 - 1.24 mg/dL Final  05/27/2019 02:42 AM 4.37 (H) 0.61 - 1.24 mg/dL Final  05/26/2019 03:10 AM 4.36 (H) 0.61 - 1.24 mg/dL Final  05/25/2019 02:35 AM 4.48 (H) 0.61 - 1.24 mg/dL Final  05/24/2019 02:46 AM 4.56 (H) 0.61 - 1.24 mg/dL Final  05/23/2019 03:39 AM 4.70 (H) 0.61 - 1.24 mg/dL Final  05/22/2019 03:01 AM 4.79 (H) 0.61 - 1.24 mg/dL Final  05/21/2019 03:00 PM 4.95 (H) 0.61 - 1.24 mg/dL Final  09/06/2018 05:00 AM 3.74 (H) 0.61 - 1.24 mg/dL Final  09/05/2018 03:32 AM 3.65 (H) 0.61 - 1.24 mg/dL Final  09/04/2018 04:14 AM 3.69 (H) 0.61 - 1.24 mg/dL Final  09/03/2018 05:53 AM 3.61 (H) 0.61 - 1.24 mg/dL Final  09/02/2018 07:20 AM 3.55 (H) 0.61 - 1.24 mg/dL Final  09/01/2018 04:45 AM 3.62 (H) 0.61 - 1.24 mg/dL Final  08/31/2018 05:01 AM 3.74 (H) 0.61 - 1.24 mg/dL Final  08/30/2018 05:42 AM 3.54 (H) 0.61 - 1.24 mg/dL Final  08/29/2018 12:29 PM 3.60 (H) 0.61 - 1.24 mg/dL Final  08/28/2018 04:24 PM 3.64 (H) 0.61 - 1.24 mg/dL Final  08/07/2018 12:26 PM 3.25 (H) 0.40 - 1.50 mg/dL Final  07/04/2018 11:20 AM 3.25 (H) 0.61 - 1.24 mg/dL Final  06/14/2018 02:17 AM 4.73 (H) 0.61 -  1.24 mg/dL Final  06/13/2018 06:05 AM 4.66 (H) 0.61 - 1.24 mg/dL Final  06/12/2018 03:12 AM 4.55 (H) 0.61 - 1.24 mg/dL Final  06/11/2018 02:40 AM 4.19 (H) 0.61 - 1.24 mg/dL Final  06/10/2018 03:38 AM 4.28 (H) 0.61 - 1.24 mg/dL Final  06/09/2018 05:56 PM 4.40 (H) 0.61 - 1.24 mg/dL Final  12/07/2017 05:08 AM 3.35 (H) 0.61 - 1.24 mg/dL Final  12/06/2017 05:04 AM 3.23 (H) 0.61 - 1.24 mg/dL Final  12/05/2017 04:49 AM 3.58 (H) 0.61 - 1.24 mg/dL Final  12/04/2017 04:43 AM 3.35 (H) 0.61 - 1.24 mg/dL Final  12/03/2017 03:47 PM 3.47 (H) 0.61 - 1.24 mg/dL Final  12/03/2017 08:31 AM 3.50 (H) 0.61 - 1.24 mg/dL Final  12/02/2017 03:56 PM 3.92 (H) 0.61 - 1.24 mg/dL Final  12/02/2017  04:05 AM 4.25 (H) 0.61 - 1.24 mg/dL Final  12/01/2017 05:22 AM 4.78 (H) 0.61 - 1.24 mg/dL Final  11/29/2017 08:02 AM 5.32 (H) 0.61 - 1.24 mg/dL Final  11/28/2017 04:07 AM 5.39 (H) 0.61 - 1.24 mg/dL Final  11/28/2017 04:07 AM 5.51 (H) 0.61 - 1.24 mg/dL Final  11/27/2017 02:22 PM 5.00 (H) 0.61 - 1.24 mg/dL Final  10/11/2016 03:58 PM 2.03 (H) 0.61 - 1.24 mg/dL Final  09/13/2016 03:25 PM 2.30 (H) 0.61 - 1.24 mg/dL Final  09/02/2016 03:06 AM 2.39 (H) 0.61 - 1.24 mg/dL Final    PMH:   Past Medical History:  Diagnosis Date  . 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 Chronic kidney disease   . Diabetes mellitus    adult-onset, type 2  . Exogenous obesity   . GERD (gastroesophageal reflux disease)   . Hypertension   . Left foot infection 06/21/2018  . Osteomyelitis of foot, left, acute ([place]) 07/04/2018    PSH:   Past Surgical History:  Procedure Laterality Date  . ANKLE SURGERY  2003   plate and screws  . APPLICATION OF WOUND VAC Left 06/12/2018   Procedure: APPLICATION OF WOUND VAC;  Surgeon: Mcarthur Rossetti, MD;  Location: [facility];  Service: Orthopedics;  Laterality: Left;  . CARDIAC CATHETERIZATION  02/16/2012   50% LAD lesion in mid vessel, otherwise no significant obstructive CAD  . HIP PINNING  1990   bilateral  . I & D EXTREMITY Left 06/10/2018   Procedure: IRRIGATION AND DEBRIDEMENT OF FOOT;  Surgeon: Mcarthur Rossetti, MD;  Location: [facility];  Service: Orthopedics;  Laterality: Left;  . I & D EXTREMITY Left 06/12/2018   Procedure: REPEAT IRRIGATION AND DEBRIDEMENT LEFT FOOT;  Surgeon: Mcarthur Rossetti, MD;  Location: [facility];  Service: Orthopedics;  Laterality: Left;  . I & D EXTREMITY Left 07/04/2018   Procedure: DEBRIDEMENT OF LEFT FOOT;  Surgeon: Newt Minion, MD;  Location: [facility];  Service: Orthopedics;  Laterality: Left;  . IR GENERIC HISTORICAL  09/01/2016   IR FLUORO GUIDE CV LINE RIGHT 09/01/2016 Marybelle Killings, MD [facility]  .  IR GENERIC HISTORICAL  09/01/2016   IR US GUIDE VASC ACCESS RIGHT 09/01/2016 Marybelle Killings, MD [facility]  . IR GENERIC HISTORICAL  09/16/2016   IR REMOVAL TUN CV CATH W/O FL 09/16/2016 Arne Cleveland, MD [facility]  . KNEE ARTHROSCOPY Left 08/30/2016   Procedure: ARTHROSCOPY KNEE;  Surgeon: Renette Butters, MD;  Location: [facility];  Service: Orthopedics;  Laterality: Left;  . LEFT AND RIGHT HEART CATHETERIZATION WITH CORONARY ANGIOGRAM N/A 02/16/2012   Procedure: LEFT AND RIGHT HEART CATHETERIZATION WITH CORONARY ANGIOGRAM;  Surgeon: Pixie Casino, MD;  Location: [facility] C[facility];  Service: Cardiovascular;  Laterality: N/A;  . TRANSTHORACIC ECHOCARDIOGRAM  08/31/2012   EF 35-40%, no significant wall motion abnormalities, diastolic relaxation abnormality - no longer needed LifeVest; LV cavity size mod dilated with mod conc hypertrophy, systolic function mod reduced; mild MR, LA mod dilated    Allergies: No Known Allergies  Medications:   Prior to Admission medications   Medication Sig Start Date End Date Taking? Authorizing Provider  acetaminophen (TYLENOL) 500 MG tablet Take 1,000 mg by mouth every 6 (six) hours as needed for mild pain.   Yes [provider]  apixaban (ELIQUIS) 5 MG TABS tablet Take 1 tablet (5 mg total) by mouth 2 (two) times daily. 11/06/18  Yes [name], [name], MD  atorvastatin (LIPITOR) 10 MG tablet Take 1 tablet (10 mg total) by mouth daily with supper. Patient taking differently: Take 10 mg by mouth every morning.  08/07/18  Yes [name], [name], MD  carvedilol (COREG) 25 MG tablet TAKE 1 TABLET BY MOUTH TWICE DAILY WITH  A  MEAL. Patient taking differently: Take 25 mg by mouth 2 (two) times daily with a meal.  06/17/19  Yes [name], [name], MD  Ferrous Sulfate (IRON) 325 (65 Fe) MG TABS TAKE 1 TABLET BY MOUTH TWICE DAILY WITH MEALS  Patient taking differently: Take 325 mg by mouth 2 (two) times daily with a meal.  07/12/19  Yes [name], [name], MD  gabapentin  (NEURONTIN) 300 MG capsule Take 1 capsule (300 mg total) by mouth at bedtime. Patient taking differently: Take 300 mg by mouth at bedtime as needed (pain).  10/25/18  Yes Rayburn, Neta Mends, PA-C  insulin aspart (NOVOLOG FLEXPEN) 100 UNIT/ML FlexPen Inject 0-15 Units into the skin 3 (three) times daily with meals. 0-15U/Subcu/TIDw/meals//CBG70-120:0U/CBG121-150:2U/CBG151-200:3U/CBG201-250:5U/CBG251-300:8U/CBG301-350:11U/CBG351-400:15U//CBG>400:Call MD 11/14/18  Yes [name], [name], MD  Insulin Glargine (LANTUS) 100 UNIT/ML Solostar Pen Inject 5 Units into the skin at bedtime. Patient taking differently: Inject 1 Units into the skin at bedtime.  11/06/18  Yes [name], [name], MD  isosorbide-hydrALAZINE (BIDIL) 20-37.5 MG tablet Take 1 tablet by mouth 2 (two) times daily. 07/24/19  Yes Larey Dresser, MD  Multiple Vitamin (MULTIVITAMIN WITH MINERALS) TABS tablet Take 1 tablet by mouth daily. 09/07/18  Yes Bonnielee Haff, MD  silver sulfADIAZINE (SILVADENE) 1 % cream Apply topically daily. Silvadene plus dry dressing to plantar ulcer left foot.  Change daily. Patient taking differently: Apply 1 application topically daily as needed (skin care). Silvadene plus dry dressing to plantar ulcer left foot.  Change daily. 09/07/18  Yes Bonnielee Haff, MD  torsemide (DEMADEX) 100 MG tablet Take 1 tablet (100 mg total) by mouth daily. 07/24/19 10/15/19 Yes Larey Dresser, MD  Vitamin D, Ergocalciferol, (DRISDOL) 1.25 MG (50000 UT) CAPS capsule Take 1 capsule (50,000 Units total) by mouth every 7 (seven) days. Patient taking differently: Take 50,000 Units by mouth every 7 (seven) days. Friday 11/06/18  Yes [name], [name], MD  amiodarone (PACERONE) 200 MG tablet Take 1 tablet (200 mg total) by mouth daily. 06/04/19 07/04/19  Darliss Cheney, MD  Continuous Blood Gluc Receiver (FREESTYLE LIBRE 14 DAY READER) DEVI 1 Device by Does not apply route daily. 11/06/18   [name], [name], MD  Continuous Blood Gluc Sensor  (FREESTYLE LIBRE 14 DAY SENSOR) MISC Inject 1 Device into the skin 4 (four) times daily as needed. 11/06/18   [name], [name], MD  glucose blood (FREESTYLE TEST STRIPS) test strip  Use as instructed 09/02/16   Ghimire, Henreitta Leber, MD  glucose monitoring kit (FREESTYLE) monitoring kit 1 each by Does not apply route 4 (four) times daily - after meals and at bedtime. 1 month Diabetic Testing Supplies for QAC-QHS accuchecks. 09/02/16   Ghimire, Henreitta Leber, MD  Insulin Pen Needle 32G X 8 MM MISC Use as directed 09/02/16   Jonetta Osgood, MD  Lancets (FREESTYLE) lancets Use as instructed 09/02/16   Ghimire, Henreitta Leber, MD  oxyCODONE-acetaminophen (PERCOCET/ROXICET) 5-325 MG tablet Take 1 tablet by mouth every 8 (eight) hours as needed for moderate pain or severe pain. Patient not taking: Reported on 10/15/2019 10/25/18   Rayburn, Neta Mends, PA-C    Inpatient medications: . amiodarone  200 mg Oral Daily  . atorvastatin  10 mg Oral Q supper  . carvedilol  25 mg Oral BID WC  . ferrous sulfate  325 mg Oral BID WC  . insulin aspart  0-15 Units Subcutaneous TID WC  . insulin aspart  0-5 Units Subcutaneous QHS  . isosorbide-hydrALAZINE  1 tablet Oral BID  . [START ON 10/18/2019] methylPREDNISolone (SOLU-MEDROL) injection  40 mg Intravenous Daily  . metolazone  5 mg Oral Once  . multivitamin with minerals  1 tablet Oral Daily  . pantoprazole  40 mg Oral Daily  . sodium bicarbonate  650 mg Oral TID  . sodium chloride flush  3 mL Intravenous Q12H    Discontinued Meds:   Medications Discontinued During This Encounter  Medication Reason  . furosemide (LASIX) injection 80 mg   . metolazone (ZAROXOLYN) tablet 2.5 mg   . apixaban (ELIQUIS) tablet 5 mg   . sodium chloride flush (NS) 0.9 % injection 3 mL   . 0.9 %  sodium chloride infusion   . spironolactone (ALDACTONE) tablet 25 mg   . heparin injection 5,000 Units   . heparin ADULT infusion 100 units/mL (25000 units/253m sodium chloride 0.45%) Dose  change  . insulin aspart (novoLOG) injection 0-9 Units   . methylPREDNISolone sodium succinate (SOLU-MEDROL) 125 mg/2 mL injection 60 mg   . furosemide (LASIX) 250 mg in dextrose 5 % 250 mL (1 mg/mL) infusion     Social History:  reports that he has never smoked. He has never used smokeless tobacco. He reports that he does not drink alcohol or use drugs.  Family History:   Family History  Problem Relation Age of Onset  . Coronary artery disease Brother        CABG at 351 DM  . Heart disease Brother 37      CAD  . Diabetes Mother   . Hypertension Mother   . Hyperlipidemia Mother   . Heart disease Mother   . Diabetes Father   . Hypertension Father     Pertinent items are noted in HPI. Weight change:   Intake/Output Summary (Last 24 hours) at 10/17/2019 1256 Last data filed at 10/17/2019 1124 Gross per 24 hour  Intake 785.63 ml  Output 150 ml  Net 635.63 ml   BP 111/75 (BP Location: Right Arm)   Pulse 84   Temp 98.2 F (36.8 C) (Oral)   Resp 12   Ht _0  (1.803 m)   Wt 136 kg   SpO2 90%   BMI 41.82 kg/m  Vitals:   10/17/19 0030 10/17/19 0400 10/17/19 1000 10/17/19 1200  BP:  115/70 (!) 127/98 111/75  Pulse: 83 89 84 84  Resp: _1 Temp:   99.1 F (37.3  C) 98.2 F (36.8 C)  TempSrc:   Oral Oral  SpO2: 94% 96% 90% 90%  Weight:      Height:         General appearance: fatigued, no distress and morbidly obese Head: Normocephalic, without obvious abnormality, atraumatic Resp: diminished breath sounds bilaterally Cardio: regular rate and rhythm and no rub GI: obese, +BS, + abd wall edema/anasarca GU: marked scrotal edema Extremities: edema 2+ lower extremity edema bilaterally to mid abdomen and left foot wrapped  Labs: Basic Metabolic Panel: Recent Labs  Lab 09/29/2019 2206 10/16/19 0406 10/17/19 0413  NA 141 141 142  K 4.6 4.7 4.7  CL 108 108 108  CO2 19* 18* 21*  GLUCOSE 103* 118* 107*  BUN 95* 100* 104*  CREATININE 4.81* 5.13* 5.48*   ALBUMIN 2.8*  --   --   CALCIUM 8.3* 8.1* 7.9*  PHOS  --  6.5*  --    Liver Function Tests: Recent Labs  Lab 10/08/2019 2206  AST 37  ALT 17  ALKPHOS 90  BILITOT 1.3*  PROT 7.3  ALBUMIN 2.8*   Recent Labs  Lab 09/22/2019 2206  LIPASE 24   No results for input(s): AMMONIA in the last 168 hours. CBC: Recent Labs  Lab 09/23/2019 2206 10/16/19 0406 10/17/19 0413  WBC 4.5 4.5 4.0  HGB 10.2* 9.7* 9.4*  HCT 33.0* 31.4* 31.0*  MCV 98.5 97.8 98.4  PLT 175 152 146*   PT/INR: _0 (inr:5) Cardiac Enzymes: )No results for input(s): CKTOTAL, CKMB, CKMBINDEX, TROPONINI in the last 168 hours. CBG: Recent Labs  Lab 10/16/19 1218 10/16/19 1643 10/16/19 2318 10/17/19 0722 10/17/19 1134  GLUCAP 96 118* 108* 97 113*    Iron Studies:  Recent Labs  Lab 10/15/19 1702 10/16/19 0406  FERRITIN 368* 415*    Xrays/Other Studies: MR FOOT LEFT WO CONTRAST  Result Date: 10/15/2019 CLINICAL DATA:  Bilateral lower extremity swelling, question of osteomyelitis ulcer on the bottom of the left forefoot EXAM: MRI OF THE LEFT FOOT WITHOUT CONTRAST TECHNIQUE: Multiplanar, multisequence MR imaging of the left was performed. No intravenous contrast was administered. COMPARISON:  Multiple radiographs, most recent radiograph same day FINDINGS: Bones/Joint/Cartilage Again noted are chronic erosive type changes seen at the base of the first MTP joint there is stable dorsal on lateral subluxation of the metatarsals at the TMT joint joints with erosive type changes and chronic disruption of the Lisfranc ligaments. There is small erosive type changes seen throughout the cuneiform. There is no acute marrow signal abnormality seen throughout the osseous structures however. No large joint effusions are present. Probable healed fracture deformity of the fifth metatarsal base seen. There is diffuse chronic periosteal reaction seen through the second through fourth metatarsal shafts. Ligaments Chronic  disruptions of the Lisfranc ligaments as described above. Muscles and Tendons Diffuse fatty atrophy with denervation seen throughout the forefoot. The flexor and extensor tendons appear to be grossly intact, however there is surrounding fluid signal on surrounding the flexor digitorum tendons. Soft tissues On the medial plantar forefoot there is areas of superficial ulceration with diffuse skin thickening and subcutaneous edema seen. No loculated fluid collections or sinus tract however are noted. There is diffuse dorsal subcutaneous edema seen on the dorsum of the forefoot. IMPRESSION: 1. 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 No definite evidence acute osteomyelitis. 2. Area superficial ulceration on the plantar base beneath the first metatarsal. No definite loculated fluid collection or sinus tract. 3.  Diffuse subcutaneous edema and cellulitis surrounding the forefoot. Electronically Signed   By: Prudencio Pair M.D.   On: 10/15/2019 22:27   ECHOCARDIOGRAM COMPLETE  Result Date: 10/15/2019    ECHOCARDIOGRAM REPORT   Patient Name:   ALICK LECOMTE Date of Exam: 10/15/2019 Medical Rec #:  [id number]        Height:       71.0 in Accession #:    [id number]       Weight:       299.8 lb Date of Birth:  [birth year]        BSA:          2.504 m Patient Age:    3 years         BP:           144/99 mmHg Patient Gender: M                HR:           92 bpm. Exam Location:  Inpatient Procedure: 2D Echo, Cardiac Doppler and Color Doppler Indications:    CHF-Acute Systolic 767.20/N47.09  History:        Patient has prior history of Echocardiogram examinations. CHF,                 CAD; Risk Factors:Dyslipidemia. Non-ischemi cardiomyopathy. CKD.  Sonographer:    Clayton Lefort RDCS (AE) Referring Phys: [id number] Lequita Halt  Sonographer Comments: Patient is morbidly obese. IMPRESSIONS  1. Severe global reduction in LV systolic function; restrictive  filling; 4 chamber enlargement; severe RV dysfunction; mild MR; moderate TR; moderate pulmonary hypertension; small pericardial effusion.  2. Left ventricular ejection fraction, by estimation, is 20 to 25%. The left ventricle has severely decreased function. The left ventricle demonstrates global hypokinesis. The left ventricular internal cavity size was moderately dilated. There is mild left ventricular hypertrophy. Left ventricular diastolic parameters are consistent with Grade III diastolic dysfunction (restrictive).  3. Right ventricular systolic function is severely reduced. The right ventricular size is mildly enlarged. There is moderately elevated pulmonary artery systolic pressure.  4. Left atrial size was mildly dilated.  5. Right atrial size was mildly dilated.  6. The mitral valve is normal in structure. Mild mitral valve regurgitation. No evidence of mitral stenosis.  7. Tricuspid valve regurgitation is moderate.  8. The aortic valve has an indeterminant number of cusps. Aortic valve regurgitation is not visualized. No aortic stenosis is present.  9. The inferior vena cava is dilated in size with <50% respiratory variability, suggesting right atrial pressure of 15 mmHg. FINDINGS  Left Ventricle: Left ventricular ejection fraction, by estimation, is 20 to 25%. The left ventricle has severely decreased function. The left ventricle demonstrates global hypokinesis. The left ventricular internal cavity size was moderately dilated. There is mild left ventricular hypertrophy. Left ventricular diastolic parameters are consistent with Grade III diastolic dysfunction (restrictive). Right Ventricle: The right ventricular size is mildly enlarged.Right ventricular systolic function is severely reduced. There is moderately elevated pulmonary artery systolic pressure. The tricuspid regurgitant velocity is 3.30 m/s, and with an assumed right atrial pressure of 15 mmHg, the estimated right ventricular systolic pressure  is 94.7 mmHg. Left Atrium: Left atrial size was mildly dilated. Right Atrium: Right atrial size was mildly dilated. Pericardium: A small pericardial effusion is present. Mitral Valve: The mitral valve is normal in structure. Normal mobility of the mitral valve leaflets. Mild mitral valve regurgitation. No evidence of mitral valve stenosis. Tricuspid Valve: The tricuspid valve  is normal in structure. Tricuspid valve regurgitation is moderate . No evidence of tricuspid stenosis. Aortic Valve: The aortic valve has an indeterminant number of cusps. Aortic valve regurgitation is not visualized. No aortic stenosis is present. Aortic valve mean gradient measures 3.2 mmHg. Aortic valve peak gradient measures 6.6 mmHg. Aortic valve area, by VTI measures 2.71 cm. Pulmonic Valve: The pulmonic valve was not well visualized. Pulmonic valve regurgitation is trivial. No evidence of pulmonic stenosis. Aorta: The aortic root is normal in size and structure. Venous: The inferior vena cava is dilated in size with less than 50% respiratory variability, suggesting right atrial pressure of 15 mmHg. IAS/Shunts: No atrial level shunt detected by color flow Doppler. Additional Comments: Severe global reduction in LV systolic function; restrictive filling; 4 chamber enlargement; severe RV dysfunction; mild MR; moderate TR; moderate pulmonary hypertension; small pericardial effusion.  LEFT VENTRICLE PLAX 2D LVIDd:         6.16 cm LVIDs:         5.03 cm LV PW:         1.38 cm LV IVS:        1.53 cm LVOT diam:     2.30 cm LV SV:         55 LV SV Index:   22 LVOT Area:     4.15 cm  LV Volumes (MOD) LV vol d, MOD A2C: 318.0 ml LV vol d, MOD A4C: 215.0 ml LV vol s, MOD A2C: 232.0 ml LV vol s, MOD A4C: 177.0 ml LV SV MOD A2C:     86.0 ml LV SV MOD A4C:     215.0 ml LV SV MOD BP:      57.1 ml RIGHT VENTRICLE            IVC RV Basal diam:  4.02 cm    IVC diam: 3.59 cm RV Mid diam:    4.69 cm RV S prime:     6.31 cm/s TAPSE (M-mode): 0.8 cm LEFT  ATRIUM             Index       RIGHT ATRIUM           Index LA diam:        5.20 cm 2.08 cm/m  RA Area:     23.90 cm LA Vol (A2C):   58.1 ml 23.20 ml/m RA Volume:   72.00 ml  28.75 ml/m LA Vol (A4C):   98.6 ml 39.37 ml/m LA Biplane Vol: 84.0 ml 33.54 ml/m  AORTIC VALVE AV Area (Vmax):    2.89 cm AV Area (Vmean):   2.79 cm AV Area (VTI):     2.71 cm AV Vmax:           128.60 cm/s AV Vmean:          85.780 cm/s AV VTI:            0.202 m AV Peak Grad:      6.6 mmHg AV Mean Grad:      3.2 mmHg LVOT Vmax:         89.40 cm/s LVOT Vmean:        57.620 cm/s LVOT VTI:          0.132 m LVOT/AV VTI ratio: 0.65  AORTA Ao Root diam: 3.30 cm Ao Asc diam:  3.60 cm TRICUSPID VALVE TR Peak grad:   43.6 mmHg TR Vmax:        330.00 cm/s  SHUNTS Systemic VTI:  0.13  m Systemic Diam: 2.30 cm Kirk Ruths MD Electronically signed by Kirk Ruths MD Signature Date/Time: 10/15/2019/4:19:33 PM    Final      Assessment/Plan: 1. 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  He has had several episodes over the past year similar to this and usually due to noncompliance with his diuretics and follow up.  He was started on a lasix drip on 10/15/19 without much improvement despite multiple doses of metolazone. 1. Stop lasix drip and change to IV lasix 160 mg q 6 hours and premedicate with metolazone 5 mg and IV albumin 25 grams 2. If he does not respond to high dose IV lasix boluses will either need to agree to start dialysis or transition to comfort care. 3. Continue to follow UOP and daily BUN/Cr 4. Will likely need transfer to ICU and initiate CRRT given his biventricular failure and low BP (not sure he would tolerate IHD due to his RV failure). 5. Will need to consult IR in am for HD cath placement if he agrees/doesn't respond.   6. Make npo after midnight just in case. 2. Acute on chronic combined diastolic and systolic CHF- due to  noncompliance with diuretics.  Significant anasarca and no response to lasix drip.  Echo with EF 84-69% EF, RV systolic function severely reduced and grade III DD (restrictive).  Plan as outlined above. 3. Acute hypoxic respiratory failure due to #2 and covid-19 Pneumonia- on 6 liters O2.  Treatment per primary svc 4. Left charcot join with plantar ulcer- followed by Dr. Sharol Given 5. P. Atrial fib- on amio per Cardiology. 6. Anemia of CKD -  7. Medical noncompliance- ongoing issue, poor health literacy and insight. 8. Disposition- poor overall prognosis.  Not certain he would tolerate intermittent HD given biventricular heart failure and low bp's.  He declined starting dialysis today and is not sure he would agree to long term dialysis.  When asked if he knew what would happen if his kidneys didn't respond and he declined dialysis he responded "I would pass away".  When asked if he was ok with that he reluctantly stated "not really".  May need to transfer to ICU for CVVHD if he doesn't respond to IV lasix and he agrees to start dialysis.  Recommend Palliative care consult to help set goals/limits of care.   Governor Rooks Joshua Massey 10/17/2019, 12:56 PM

## 2019-10-18 ENCOUNTER — Inpatient Hospital Stay (HOSPITAL_COMMUNITY): Payer: BC Managed Care – PPO

## 2019-10-18 DIAGNOSIS — N179 Acute kidney failure, unspecified: Secondary | ICD-10-CM

## 2019-10-18 LAB — GLUCOSE, CAPILLARY
Glucose-Capillary: 105 mg/dL — ABNORMAL HIGH (ref 70–99)
Glucose-Capillary: 110 mg/dL — ABNORMAL HIGH (ref 70–99)
Glucose-Capillary: 114 mg/dL — ABNORMAL HIGH (ref 70–99)
Glucose-Capillary: 128 mg/dL — ABNORMAL HIGH (ref 70–99)
Glucose-Capillary: 120 mg/dL — ABNORMAL HIGH (ref 70–99)

## 2019-10-18 LAB — CBC
HCT: 30.1 % — ABNORMAL LOW (ref 39.0–52.0)
Hemoglobin: 9.2 g/dL — ABNORMAL LOW (ref 13.0–17.0)
MCH: 30 pg (ref 26.0–34.0)
MCHC: 30.6 g/dL (ref 30.0–36.0)
MCV: 98 fL (ref 80.0–100.0)
Platelets: 153 10*3/uL (ref 150–400)
RBC: 3.07 MIL/uL — ABNORMAL LOW (ref 4.22–5.81)
RDW: 16.6 % — ABNORMAL HIGH (ref 11.5–15.5)
WBC: 4.2 10*3/uL (ref 4.0–10.5)
nRBC: 0 % (ref 0.0–0.2)

## 2019-10-18 LAB — APTT
aPTT: 126 seconds — ABNORMAL HIGH (ref 24–36)
aPTT: 98 seconds — ABNORMAL HIGH (ref 24–36)
aPTT: 95 seconds — ABNORMAL HIGH (ref 24–36)

## 2019-10-18 LAB — RENAL FUNCTION PANEL
Albumin: 2.5 g/dL — ABNORMAL LOW (ref 3.5–5.0)
Albumin: 2.8 g/dL — ABNORMAL LOW (ref 3.5–5.0)
Anion gap: 6 (ref 5–15)
Anion gap: 14 (ref 5–15)
BUN: 111 mg/dL — ABNORMAL HIGH (ref 6–20)
BUN: 117 mg/dL — ABNORMAL HIGH (ref 6–20)
CO2: 25 mmol/L (ref 22–32)
CO2: 20 mmol/L — ABNORMAL LOW (ref 22–32)
Calcium: 7.9 mg/dL — ABNORMAL LOW (ref 8.9–10.3)
Calcium: 7.9 mg/dL — ABNORMAL LOW (ref 8.9–10.3)
Chloride: 110 mmol/L (ref 98–111)
Chloride: 107 mmol/L (ref 98–111)
Creatinine, Ser: 5.63 mg/dL — ABNORMAL HIGH (ref 0.61–1.24)
Creatinine, Ser: 5.74 mg/dL — ABNORMAL HIGH (ref 0.61–1.24)
GFR calc Af Amer: 13 mL/min — ABNORMAL LOW (ref >60)
GFR calc Af Amer: 13 mL/min — ABNORMAL LOW (ref >60)
GFR calc non Af Amer: 11 mL/min — ABNORMAL LOW (ref >60)
GFR calc non Af Amer: 11 mL/min — ABNORMAL LOW (ref >60)
Glucose, Bld: 110 mg/dL — ABNORMAL HIGH (ref 70–99)
Glucose, Bld: 128 mg/dL — ABNORMAL HIGH (ref 70–99)
Phosphorus: 7.2 mg/dL — ABNORMAL HIGH (ref 2.5–4.6)
Phosphorus: 7.4 mg/dL — ABNORMAL HIGH (ref 2.5–4.6)
Potassium: 4.9 mmol/L (ref 3.5–5.1)
Potassium: 5.2 mmol/L — ABNORMAL HIGH (ref 3.5–5.1)
Sodium: 141 mmol/L (ref 135–145)
Sodium: 141 mmol/L (ref 135–145)

## 2019-10-18 LAB — AMMONIA: Ammonia: 53 umol/L — ABNORMAL HIGH (ref 9–35)

## 2019-10-18 LAB — BRAIN NATRIURETIC PEPTIDE: B Natriuretic Peptide: 2531.6 pg/mL — ABNORMAL HIGH (ref 0.0–100.0)

## 2019-10-18 LAB — PROCALCITONIN: Procalcitonin: 1.08 ng/mL

## 2019-10-18 LAB — HEPARIN LEVEL (UNFRACTIONATED): Heparin Unfractionated: 1.98 IU/mL — ABNORMAL HIGH (ref 0.30–0.70)

## 2019-10-18 LAB — C-REACTIVE PROTEIN: CRP: 5.6 mg/dL — ABNORMAL HIGH (ref <1.0)

## 2019-10-18 LAB — D-DIMER, QUANTITATIVE: D-Dimer, Quant: 13.49 ug/mL-FEU — ABNORMAL HIGH (ref 0.00–0.50)

## 2019-10-18 LAB — MAGNESIUM: Magnesium: 2.2 mg/dL (ref 1.7–2.4)

## 2019-10-18 MED ORDER — DARBEPOETIN ALFA 40 MCG/0.4ML IJ SOSY
40.0000 ug | PREFILLED_SYRINGE | Freq: Once | INTRAMUSCULAR | Status: AC
  Administered 2019-10-20: 40 ug via SUBCUTANEOUS
  Filled 2019-10-18: qty 0.4

## 2019-10-18 MED ORDER — SODIUM CHLORIDE 0.9 % IV SOLN
INTRAVENOUS | Status: DC | PRN
  Administered 2019-10-18 – 2019-10-19 (×2): 500 mL via INTRAVENOUS

## 2019-10-18 MED ORDER — METOLAZONE 5 MG PO TABS
5.0000 mg | ORAL_TABLET | Freq: Once | ORAL | Status: AC
  Administered 2019-10-18: 5 mg via ORAL
  Filled 2019-10-18: qty 1

## 2019-10-18 MED ORDER — PRISMASOL BGK 4/2.5 32-4-2.5 MEQ/L REPLACEMENT SOLN
Status: DC
  Filled 2019-10-18 (×21): qty 5000

## 2019-10-18 MED ORDER — ALBUMIN HUMAN 25 % IV SOLN
25.0000 g | Freq: Once | INTRAVENOUS | Status: AC
  Administered 2019-10-18: 11:00:00 25 g via INTRAVENOUS
  Filled 2019-10-18: qty 100

## 2019-10-18 MED ORDER — PRISMASOL BGK 4/2.5 32-4-2.5 MEQ/L REPLACEMENT SOLN
Status: DC
  Filled 2019-10-18 (×27): qty 5000

## 2019-10-18 MED ORDER — HEPARIN SODIUM (PORCINE) 1000 UNIT/ML DIALYSIS
1000.0000 [IU] | INTRAMUSCULAR | Status: DC | PRN
  Administered 2019-10-18: 3100 [IU] via INTRAVENOUS_CENTRAL
  Administered 2019-10-23 – 2019-10-24 (×2): 2800 [IU] via INTRAVENOUS_CENTRAL
  Filled 2019-10-18 (×2): qty 6
  Filled 2019-10-18: qty 3
  Filled 2019-10-18 (×4): qty 6
  Filled 2019-10-18: qty 3

## 2019-10-18 MED ORDER — PRISMASOL BGK 4/2.5 32-4-2.5 MEQ/L IV SOLN
INTRAVENOUS | Status: DC
  Filled 2019-10-18 (×122): qty 5000

## 2019-10-18 NOTE — Progress Notes (Signed)
CRRT initiated at 1632.  Patient is tolerating well and is currently resting.  Will continue to monitor patient

## 2019-10-18 NOTE — Progress Notes (Signed)
[facility] Progress Note  Name: Joshua Massey MRN: [id number] DOB: May 01, 1976  Chief Complaint:  Presented with shortness of breath and swelling and found to have covid   Subjective:  He had 1.1 liters UOP over 4/29.  Has received albumin and lasix 160 mg IV every 6 hours as well as metolazone 5 mg once.  He states that he is not ready to give up.  We discussed the risks/benefits/indications of the different forms of dialysis and he is willing to try CRRT.  Review of systems:  Reports Shortness of breath  Has been NPO  Has been in the bed  No n/v  ------------- Background on consult:  Mr. 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 Atrial fibrillation on Eliquis, who presented with progressive increase in lower ext edema and redness, abdominal girth, DOE, orthopnea, and weight gain after he ran out of his torsemide. He was also noted to be covid-19 +.  He was admitted with acute on chronic CHF and we were consulted to further evaluate and manage his AKI/CKD stage 4-5 presumably due to cardiorenal syndrome. The trend in Scr is seen below.  Of note, he has had multiple admissions with a similar presentation, most recently on 05/22/19 at that time he declined to have vascular access placed in preparation for dialysis despite his advanced CKD.  He also failed to follow up in our office or with the heart failure clinic.  When speaking with him today about his worsening renal function and lack of response to a lasix drip, and likely need for dialysis he declined proceeding with HD catheter placement today.  He is not sure he would go on dialysis and understands the consequences.  However when asked if he was ok with dying of kidney failure he reluctantly stated, "not  really".     Intake/Output Summary (Last 24 hours) at 10/18/2019 0820 Last data filed at 10/18/2019 9622 Gross per 24 hour  Intake 1113.95 ml  Output 1100 ml  Net 13.95 ml    Vitals:  Vitals:   10/17/19 1603 10/17/19 2110 10/18/19 0631 10/18/19 0723  BP: 139/79 120/76 115/73 115/75  Pulse: 86   74  Resp: 19 19  17   Temp: 98 F (36.7 C) 98.8 F (37.1 C) 98.8 F (37.1 C) (!) 100.6 F (38.1 C)  TempSrc: Oral  Oral Oral  SpO2: 93%   90%  Weight:      Height:         Physical Exam:  General adult male in bed with shortness of breath with speech  HEENT normocephalic atraumatic extraocular movements intact sclera anicteric Neck increased circumference trachea midline Lungs coarse and reduced breath sounds; on suppl oxygen  Heart S1S2 no rub Abdomen obese habitus anasarca Extremities anasarca  Neuro - alert and oriented ; insight impaired    Medications reviewed   Labs:  BMP Latest Ref Rng & Units 10/18/2019 10/17/2019 10/16/2019  Glucose 70 - 99 mg/dL 110(H) 107(H) 118(H)  BUN 6 - 20 mg/dL 111(H) 104(H) 100(H)  Creatinine 0.61 - 1.24 mg/dL 5.63(H) 5.48(H) 5.13(H)  Sodium 135 - 145 mmol/L 141 142 141  Potassium 3.5 - 5.1 mmol/L 4.9 4.7 4.7  Chloride 98 - 111 mmol/L 110 108 108  CO2 22 - 32 mmol/L 25 21(L) 18(L)  Calcium 8.9 - 10.3  mg/dL 7.9(L) 7.9(L) 8.1(L)     Assessment/Plan:   Assessment/Plan: 1. Oliguric AKI/CKD stage 4-5- in setting of acute on chronic combined diastolic and systolic CHF as well as LEXNT-70 pneumonia.  He has had several episodes over the past year similar to this and usually due to noncompliance with his diuretics and follow up.  He was started on a lasix drip on 10/15/19 without much improvement despite multiple doses of metolazone. 1. For now continue IV lasix 160 mg q 6 hours 2. Re-dose albumin/metolazone  3. Spoke with his team - we are transferring to the ICU as he would benefit from CRRT  4. Have contacted pulmonology for the nontunneled  catheter  5. Do not feel that he would tolerate intermittent HD right now and may ultimately poorly tolerate given his combined diastolic and systolic CHF  2. Acute on chronic combined diastolic and systolic CHF- due to noncompliance with diuretics.  Significant anasarca.  Echo with EF 01-74% EF, RV systolic function severely reduced and grade III DD (restrictive).  Plan as outlined above. 3. Acute hypoxic respiratory failure due to #2 and covid-19 Pneumonia- on 6 liters O2.   4. covid - per primary team  5. Left charcot join with plantar ulcer- followed by Dr. Sharol Given 6. P. Atrial fib- on amio per Cardiology. 7. Anemia of CKD - no acute indication for PRBC's .  aranesp 40 mcg once on 4/30 8. Medical noncompliance- ongoing issue, poor health literacy and insight. 9. Disposition- poor overall prognosis.  Not certain he would tolerate intermittent HD given biventricular heart failure and low bp's.     Do also recommend palliative care consult to help set goals/limits of care.  Claudia Desanctis, MD 10/18/2019 9:20 AM

## 2019-10-18 NOTE — Progress Notes (Signed)
[facility] for IV Heparin Indication: atrial fibrillation  No Known Allergies  Patient Measurements: Height: 5\' 11"  (180.3 cm) Weight: (!) 165.6 kg (365 lb 1.3 oz) IBW/kg (Calculated) : 75.3 Heparin Dosing Weight: 106 kg  Vital Signs: Temp: 97.3 F (36.3 C) (04/30 2300) Temp Source: Axillary (04/30 2300) BP: 125/82 (04/30 2300) Pulse Rate: 73 (04/30 2300)  Labs: Recent Labs    10/16/19 0406 10/16/19 0406 10/16/19 1930 10/17/19 0413 10/17/19 1633 10/18/19 0222 10/18/19 1346 10/18/19 1540 10/18/19 2200  HGB 9.7*   < >  --  9.4*  --  9.2*  --   --   --   HCT 31.4*  --   --  31.0*  --  30.1*  --   --   --   PLT 152  --   --  146*  --  153  --   --   --   APTT  --    < > 90* 187*   < > 126* 98*  --  95*  HEPARINUNFRC  --   --  >2.20* >2.20*  --  1.98*  --   --   --   CREATININE 5.13*   < >  --  5.48*  --  5.63*  --  5.74*  --    < > = values in this interval not displayed.    Estimated Creatinine Clearance: 26.4 mL/min (A) (by C-G formula based on SCr of 5.74 mg/dL (H)).   Assessment: 44 yr old male presented with severe fluid overload and was found to have COVID. He is on apixaban PTA for hx a fib (last dose on 4/27@1407 ). Scr continues to increase from 4.81 to 5.48 (pt refusing HD). Pharmacy was consulted to transition pt from apixaban to IV heparin.  Given recent apixaban dosing, will monitor both aPTT and heparin level until correlated.   PTT therapeutic (95 sec) on gtt at 900 units/hr. No bleeding noted.  Goal of Therapy:  Heparin level 0.3-0.7 units/ml aPTT 66-102 seconds Monitor platelets by anticoagulation protocol: Yes   Plan:  Continue heparin infusion at 900 units/hr Daily HL, APTT, CBC  Sherlon Handing, PharmD, BCPS Please see amion for complete clinical pharmacist phone list 10/18/2019, 11:38 PM

## 2019-10-18 NOTE — Progress Notes (Signed)
Pt arrived to 28m11 on bariatric bed, however requesting to be switched to a "regular bed" for comfort as he was constantly "sinking". Pt. transitioned to ICU bed and now resting more comfortably.

## 2019-10-18 NOTE — Progress Notes (Signed)
Pt is having increased lethargy and confusion. Md made aware. Will continue to monitor pt.

## 2019-10-18 NOTE — Procedures (Signed)
Hemodialysis Catheter Insertion Procedure Note Joshua Massey [id number] 01-27-1976  Procedure: Insertion of Hemodialysis Catheter Indications: Dialysis Access   Procedure Details Consent: Risks of procedure as well as the alternatives and risks of each were explained to the (patient/caregiver).  Consent for procedure obtained. Time Out: Verified patient identification, verified procedure, site/side was marked, verified correct patient position, special equipment/implants available, medications/allergies/relevent history reviewed, required imaging and test results available.  Performed  Maximum sterile technique was used including antiseptics, cap, gloves, gown, hand hygiene, mask and sheet. Skin prep: Chlorhexidine; local anesthetic administered Double lumen hemodialysis catheter was inserted into right internal jugular vein using the Seldinger technique.  Evaluation Blood flow good Complications: No apparent complications Patient did tolerate procedure well. Chest X-ray ordered to verify placement.  CXR: pending.  HD cath was placed with live time [place] for vessel visualization, and for guidewire visualization prior to placement of the HD cath. Procedure was done under the direct supervision of Dr. Erskine Emery.  CXR is pending Pt is stable and tolerated the procedure well.   Joshua Massey 10/18/2019

## 2019-10-18 NOTE — Progress Notes (Signed)
[place] for IV Heparin Indication: atrial fibrillation  No Known Allergies  Patient Measurements: Height: 5\' 11"  (180.3 cm) Weight: 136 kg (299 lb 13.2 oz) IBW/kg (Calculated) : 75.3 Heparin Dosing Weight: 106 kg  Vital Signs: Temp: 98.8 F (37.1 C) (04/29 2110) Temp Source: Oral (04/29 1603) BP: 120/76 (04/29 2110) Pulse Rate: 86 (04/29 1603)  Labs: Recent Labs    10/15/19 1702 10/16/19 0406 10/16/19 0406 10/16/19 1930 10/17/19 0413 10/17/19 1633 10/18/19 0222  HGB  --  9.7*   < >  --  9.4*  --  9.2*  HCT  --  31.4*  --   --  31.0*  --  30.1*  PLT  --  152  --   --  146*  --  153  APTT  --   --    < > 90* 187* 101* 126*  HEPARINUNFRC  --   --   --  >2.20* >2.20*  --  1.98*  CREATININE  --  5.13*  --   --  5.48*  --   --   TROPONINIHS 41*  --   --   --   --   --   --    < > = values in this interval not displayed.    Estimated Creatinine Clearance: 24.7 mL/min (A) (by C-G formula based on SCr of 5.48 mg/dL (H)).   Assessment: 44 yr old male presented with severe fluid overload and was found to have COVID. He is on apixaban PTA for hx a fib (last dose on 4/27@1407 ). Scr continues to increase from 4.81 to 5.48 (pt refusing HD). Pharmacy was consulted to transition pt from apixaban to IV heparin.  Given recent apixaban dosing, will monitor both aPTT and heparin level until correlated.   PTT back up to supratherapeutic (126 sec) on gtt at 1050 units/hr. Heparin level remains elevated due to recent apixaban. No bleeding noted.  Goal of Therapy:  Heparin level 0.3-0.7 units/ml aPTT 66-102 seconds Monitor platelets by anticoagulation protocol: Yes   Plan:  Reduce heparin infusion to 900 units/hr Check 8-hr aPTT  Sherlon Handing, PharmD, BCPS Please see amion for complete clinical pharmacist phone list 10/18/2019, 3:24 AM

## 2019-10-18 NOTE — Progress Notes (Signed)
ANTICOAGULATION CONSULT NOTE   Pharmacy Consult for IV Heparin Indication: atrial fibrillation  No Known Allergies  Patient Measurements: Height: 5\' 11"  (180.3 cm) Weight: 136 kg (299 lb 13.2 oz) IBW/kg (Calculated) : 75.3 Heparin Dosing Weight: 106 kg  Vital Signs: Temp: 98.5 F (36.9 C) (04/30 1235) Temp Source: Oral (04/30 1235) BP: 127/86 (04/30 1315) Pulse Rate: 74 (04/30 1315)  Labs: Recent Labs    10/15/19 1702 10/16/19 0406 10/16/19 0406 10/16/19 1930 10/17/19 0413 10/17/19 0413 10/17/19 1633 10/18/19 0222 10/18/19 1346  HGB  --  9.7*   < >  --  9.4*  --   --  9.2*  --   HCT  --  31.4*  --   --  31.0*  --   --  30.1*  --   PLT  --  152  --   --  146*  --   --  153  --   APTT  --   --    < > 90* 187*   < > 101* 126* 98*  HEPARINUNFRC  --   --   --  >2.20* >2.20*  --   --  1.98*  --   CREATININE  --  5.13*  --   --  5.48*  --   --  5.63*  --   TROPONINIHS 41*  --   --   --   --   --   --   --   --    < > = values in this interval not displayed.    Estimated Creatinine Clearance: 24.1 mL/min (A) (by C-G formula based on SCr of 5.63 mg/dL (H)).   Assessment: 44 yr old male presented with severe fluid overload and was found to have COVID. He is on apixaban PTA for hx a fib (last dose on 4/27@1407 ). Scr continues to increase from 4.81 to 5.48 (pt refusing HD). Pharmacy was consulted to transition pt from apixaban to IV heparin.  Given recent apixaban dosing, will monitor both aPTT and heparin level until correlated.   PTT therapeutic (98 sec) on gtt at 900 units/hr. No bleeding noted.  Goal of Therapy:  Heparin level 0.3-0.7 units/ml aPTT 66-102 seconds Monitor platelets by anticoagulation protocol: Yes   Plan:  Continue heparin infusion at 900 units/hr Check 8-hr aPTT Daily HL, APTT, CBC  Nova Evett A. Levada Dy, PharmD, BCPS, FNKF Clinical Pharmacist [place] Please utilize Amion for appropriate phone number to reach the unit pharmacist ([place])   10/18/2019, 2:13 PM

## 2019-10-18 NOTE — Progress Notes (Signed)
Bear hugger applied 

## 2019-10-18 NOTE — Progress Notes (Signed)
PROGRESS NOTE                                                                                                                                                                                                             Patient Demographics:    Joshua Massey, is a 44 y.o. male, DOB - 1976/01/30, MLJ:[id number]  Outpatient Primary MD for the patient is [name], [name], MD    LOS - 3  Admit date - 09/26/2019    Chief Complaint  Patient presents with  . Leg Swelling  . Abdominal Pain       Brief Narrative  - Joshua Massey is a 44 y.o. male with medical history significant of IDDM, HTN, and chronic systolic heart failure EF 20-25%,CKD stage IV, morbid obesity, poor compliance with medications, presented with increasing redness and increasing short of breath for 3 weeks, he ran out of his diuretics few weeks ago.  In the ER he was diagnosed with acute on chronic combined systolic diastolic heart failure with massive fluid overload, AKI on CKD 4, incidental Covid infection and admitted to the hospital.   Subjective:   Patient in bed, appears comfortable, denies any headache, no fever, no chest pain or pressure, mild shortness of breath , no abdominal pain. No focal weakness.   Assessment  & Plan :      1. Acute Hypoxic Resp. Failure due to severe acute on chronic combined systolic and diastolic heart failure with EF of 25%.  He has currently been placed on Lasix drip which has been increased on 10/17/2019 after discussions with the cardiology team, also gave him high-dose metolazone on 10/17/2019, urine output has been very low, he has massive fluid overload, cardiology following, been placed on salt and fluid restriction, monitor intake output and renal function along with electrolytes.  He is at least 25 to 30 L positive.  Continue combination of amiodarone, Coreg, BiDil and diuretics.  Renal function precludes the use of ACE/ARB  or Entresto. Low grade fever 10/18/19 with mild rise in Procalcitonin - could be from Covid, check 2 set  and monitor, appears non toxic.   2. Acute Covid 19 Viral Pneumonitis during the ongoing 2020 Covid 19 Pandemic - this seems to be incidental finding will monitor. Encouraged the patient to sit up in chair in the daytime use I-S  and flutter valve for pulmonary toiletry and then prone in bed when at night.  Will advance activity and titrate down oxygen as possible.   Recent Labs  Lab 10/15/19 1107 10/15/19 1201 10/15/19 1702 10/16/19 0406 10/16/19 0733 10/17/19 0413 10/18/19 0222  CRP  --   --  5.1* 5.2*  --  5.7*  --   DDIMER  --   --  15.09* 13.60*  --  13.94* 13.49*  BNP 1,305.3*  --   --   --   --  1,504.6* 2,531.6*  PROCALCITON  --   --  0.61  --  0.68 0.75 1.08  SARSCOV2NAA  --  POSITIVE*  --   --   --   --   --     Hepatic Function Latest Ref Rng & Units 10/18/2019 09/20/2019 06/04/2019  Total Protein 6.5 - 8.1 g/dL - 7.3 -  Albumin 3.5 - 5.0 g/dL 2.5(L) 2.8(L) 2.9(L)  AST 15 - 41 U/L - 37 -  ALT 0 - 44 U/L - 17 -  Alk Phosphatase 38 - 126 U/L - 90 -  Total Bilirubin 0.3 - 1.2 mg/dL - 1.3(H) -  Bilirubin, Direct 0.0 - 0.2 mg/dL - - -    3.  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  No signs of osteomyelitis on MRI, no signs of cellulitis, orthopedics Dr. Sharol Given following.  Will monitor.  He will be high risk candidate for surgery right now as his CHF is decompensated and he is in massive fluid overload.  4.  AKI on CKD 4.  Baseline creatinine close to 4.7.  Somewhat oliguric despite high-dose diuretics, renal following DW Dr Royce Macadamia 10/18/19 - CRRT to be started today, poor HD candidate due to bi ventricular failure.  5.  Dyslipidemia.  Continue statin.  6.  Essential hypertension.  Continue home dose Coreg and BiDil combination along with diuretics for now.  Monitor and adjust.   7.  Paroxysmal atrial fibrillation [place] vas 2 score of greater than 3.  For now on  amiodarone and beta-blocker for rate control, heparin drip.  Will monitor.  Cardiology on board.   8.  Anemia of chronic disease.  No signs of active bleeding, anemia panel, oral iron supplement, PPI and monitor.    Condition - Fair  Family Communication  :    Mother [phone] on 10/17/19 at 9:35 AM.  Message left. [name] [phone] on 10/17/2019  Code Status :  Full  Diet :   Diet Order            Diet NPO time specified Except for: Sips with Meds  Diet effective now               Disposition Plan  : Stay in the inpatient hospital in ICU for severe acute on chronic combined systolic and diastolic CHF, AKI on CKD 4, COVID-19 infection.  Thereafter discharge home once medically stable. CRRT initiated as well.  Status is: Inpatient  Remains inpatient appropriate because:IV treatments appropriate due to intensity of illness or inability to take PO   Dispo: The patient is from: Home              Anticipated d/c is to: Home              Anticipated d/c date is: > 3 days              Patient currently is not medically stable to d/c.   Consults  :  Cards,  Ortho, Renal  Procedures  :    MRI left foot.  No osteomyelitis. Charcot joints.  TTE.  1. Severe global reduction in LV systolic function; restrictive filling; 4 chamber enlargement; severe RV dysfunction; mild MR; moderate TR; moderate pulmonary hypertension; small pericardial effusion. 2. Left ventricular ejection fraction, by estimation, is 20 to 25%. The left ventricle has severely decreased function. The left ventricle demonstrates global hypokinesis. The left ventricular internal cavity size was moderately dilated. There is mild left ventricular hypertrophy. Left ventricular diastolic parameters are consistent with Grade III diastolic dysfunction (restrictive).  3. Right ventricular systolic function is severely reduced. The right ventricular size is mildly enlarged. There is moderately elevated pulmonary  artery systolic pressure.  4. Left atrial size was mildly dilated.  5. Right atrial size was mildly dilated.  6. The mitral valve is normal in structure. Mild mitral valve regurgitation. No evidence of mitral stenosis.  7. Tricuspid valve regurgitation is moderate.  8. The aortic valve has an indeterminant number of cusps. Aortic valve regurgitation is not visualized. No aortic stenosis is present.  9. The inferior vena cava is dilated in size with <50% respiratory variability, suggesting right atrial pressure of 15 mmHg.  PUD Prophylaxis :   DVT Prophylaxis  :   Heparin  Lab Results  Component Value Date   PLT 153 10/18/2019    Inpatient Medications  Scheduled Meds: . amiodarone  200 mg Oral Daily  . atorvastatin  10 mg Oral Q supper  . carvedilol  25 mg Oral BID WC  . Chlorhexidine Gluconate Cloth  6 each Topical Daily  . darbepoetin (ARANESP) injection - NON-DIALYSIS  40 mcg Subcutaneous Once  . ferrous sulfate  325 mg Oral BID WC  . insulin aspart  0-15 Units Subcutaneous TID WC  . insulin aspart  0-5 Units Subcutaneous QHS  . isosorbide-hydrALAZINE  1 tablet Oral BID  . methylPREDNISolone (SOLU-MEDROL) injection  40 mg Intravenous Daily  . metolazone  5 mg Oral Once  . multivitamin with minerals  1 tablet Oral Daily  . pantoprazole  40 mg Oral Daily  . sodium bicarbonate  650 mg Oral TID  . sodium chloride flush  3 mL Intravenous Q12H   Continuous Infusions: .  prismasol BGK 4/2.5    .  prismasol BGK 4/2.5    . albumin human    . furosemide 160 mg (10/18/19 0658)  . heparin 900 Units/hr (10/18/19 0339)  . prismasol BGK 4/2.5    . remdesivir 100 mg in NS 100 mL 100 mg (10/18/19 0908)   PRN Meds:.acetaminophen, gabapentin, heparin, LORazepam, ondansetron (ZOFRAN) IV  Antibiotics  :    Anti-infectives (From admission, onward)   Start     Dose/Rate Route Frequency Ordered Stop   10/18/19 1000  remdesivir 100 mg in sodium chloride 0.9 % 100 mL IVPB     100  mg 200 mL/hr over 30 Minutes Intravenous Daily 10/17/19 0707 10/22/19 0959   10/17/19 0800  remdesivir 200 mg in sodium chloride 0.9% 250 mL IVPB     200 mg 580 mL/hr over 30 Minutes Intravenous Once 10/17/19 0707 10/17/19 1906       Time Spent in minutes  30   Lala Lund M.D on 10/18/2019 at 9:23 AM  To page go to [email or web address] - password [email or web address]  [facility] -  Office  [phone]   See all Orders from today for further details    Objective:   Vitals:   10/17/19 1603 10/17/19 2110 10/18/19 0631  10/18/19 0723  BP: 139/79 120/76 115/73 115/75  Pulse: 86   74  Resp: 19 19  17   Temp: 98 F (36.7 C) 98.8 F (37.1 C) 98.8 F (37.1 C) (!) 100.6 F (38.1 C)  TempSrc: Oral  Oral Oral  SpO2: 93%   90%  Weight:      Height:        Wt Readings from Last 3 Encounters:  10/15/19 136 kg  06/04/19 (!) 136.4 kg  12/10/18 (!) 142.9 kg     Intake/Output Summary (Last 24 hours) at 10/18/2019 0923 Last data filed at 10/18/2019 0838 Gross per 24 hour  Intake 1116.95 ml  Output 1100 ml  Net 16.95 ml     Physical Exam  Awake Alert, No new F.N deficits, Normal affect [place].AT,PERRAL Supple Neck,No JVD, No cervical lymphadenopathy appriciated.  Symmetrical Chest wall movement, Good air movement bilaterally, few rales RRR,No Gallops, Rubs or new Murmurs, No Parasternal Heave +ve B.Sounds, Abd Soft, No tenderness, No organomegaly appriciated, No rebound - guarding or rigidity. 3 + edema in the scrotum and lower extremities, left foot plantar ulcer noted, no pus or surrounding infection, few R Leg open sores    Data Review:    CBC Recent Labs  Lab 09/20/2019 2206 10/16/19 0406 10/17/19 0413 10/18/19 0222  WBC 4.5 4.5 4.0 4.2  HGB 10.2* 9.7* 9.4* 9.2*  HCT 33.0* 31.4* 31.0* 30.1*  PLT 175 152 146* 153  MCV 98.5 97.8 98.4 98.0  MCH 30.4 30.2 29.8 30.0  MCHC 30.9 30.9 30.3 30.6  RDW 16.4* 16.5* 16.8* 16.6*    Chemistries  Recent Labs  Lab 10/07/2019 2206  10/15/19 1702 10/16/19 0406 10/17/19 0413 10/18/19 0222  NA 141  --  141 142 141  K 4.6  --  4.7 4.7 4.9  CL 108  --  108 108 110  CO2 19*  --  18* 21* 25  GLUCOSE 103*  --  118* 107* 110*  BUN 95*  --  100* 104* 111*  CREATININE 4.81*  --  5.13* 5.48* 5.63*  CALCIUM 8.3*  --  8.1* 7.9* 7.9*  AST 37  --   --   --   --   ALT 17  --   --   --   --   ALKPHOS 90  --   --   --   --   BILITOT 1.3*  --   --   --   --   MG  --   --  2.0 2.1 2.2  HGBA1C  --  6.3*  --   --   --      ------------------------------------------------------------------------------------------------------------------ No results for input(s): CHOL, HDL, LDLCALC, TRIG, CHOLHDL, LDLDIRECT in the last 72 hours.  Lab Results  Component Value Date   HGBA1C 6.3 (H) 10/15/2019   ------------------------------------------------------------------------------------------------------------------ No results for input(s): TSH, T4TOTAL, T3FREE, THYROIDAB in the last 72 hours.  Invalid input(s): FREET3  Cardiac Enzymes No results for input(s): CKMB, TROPONINI, MYOGLOBIN in the last 168 hours.  Invalid input(s): CK ------------------------------------------------------------------------------------------------------------------    Component Value Date/Time   BNP 2,531.6 (H) 10/18/2019 0222   BNP 15.0 01/01/2013 1212    Micro Results Recent Results (from the past 240 hour(s))  Respiratory Panel by RT PCR (Flu A&B, Covid) - Nasopharyngeal Swab     Status: Abnormal   Collection Time: 10/15/19 12:01 PM   Specimen: Nasopharyngeal Swab  Result Value Ref Range Status   SARS Coronavirus 2 by RT PCR POSITIVE (A) NEGATIVE  Final    Comment: RESULT CALLED TO, READ BACK BY AND VERIFIED WITH: Benard Halsted RN 14:30 10/15/19 ([email or web address]) (NOTE) SARS-CoV-2 target nucleic acids are DETECTED. SARS-CoV-2 RNA is generally detectable in upper respiratory specimens  during the acute phase of infection. Positive results are indicative of  the presence of the identified virus, but do not rule out bacterial infection or co-infection with other pathogens not detected by the test. Clinical correlation with patient history and other diagnostic information is necessary to determine patient infection status. The expected result is Negative. Fact Sheet for Patients:  PinkCheek.be Fact Sheet for Healthcare Providers: GravelBags.it This test is not yet approved or cleared by the [place] FDA and  has been authorized for detection and/or diagnosis of SARS-CoV-2 by FDA under an Emergency Use Authorization (EUA).  This EUA will remain in effect (meaning this test can be used) f or the duration of  the COVID-19 declaration under Section 564(b)(1) of the Act, 21 U.S.C. section 360bbb-3(b)(1), unless the authorization is terminated or revoked sooner.    Influenza A by PCR NEGATIVE NEGATIVE Final   Influenza B by PCR NEGATIVE NEGATIVE Final    Comment: (NOTE) The Xpert Xpress SARS-CoV-2/FLU/RSV assay is intended as an aid in  the diagnosis of influenza from Nasopharyngeal swab specimens and  should not be used as a sole basis for treatment. Nasal washings and  aspirates are unacceptable for Xpert Xpress SARS-CoV-2/FLU/RSV  testing. Fact Sheet for Patients: PinkCheek.be Fact Sheet for Healthcare Providers: GravelBags.it This test is not yet approved or cleared by the [place] FDA and  has been authorized for detection and/or diagnosis of SARS-CoV-2 by  FDA under an Emergency Use Authorization (EUA). This EUA will remain  in effect (meaning this test can be used) for the duration of the  Covid-19 declaration under Section 564(b)(1) of the Act, 21  U.S.C. section 360bbb-3(b)(1), unless the authorization is  terminated or revoked. Performed at [facility], [place] [address] [place], [place] [address]     MRSA PCR Screening     Status: None   Collection Time: 10/16/19  3:12 PM   Specimen: Nasal Mucosa; Nasopharyngeal  Result Value Ref Range Status   MRSA by PCR NEGATIVE NEGATIVE Final    Comment:        The GeneXpert MRSA Assay (FDA approved for NASAL specimens only), is one component of a comprehensive MRSA colonization surveillance program. It is not intended to diagnose MRSA infection nor to guide or monitor treatment for MRSA infections. Performed at [facility], [place] [address] [place], [place] [address]     Radiology Reports MR FOOT LEFT WO CONTRAST  Result Date: 10/15/2019 CLINICAL DATA:  Bilateral lower extremity swelling, question of osteomyelitis ulcer on the bottom of the left forefoot EXAM: MRI OF THE LEFT FOOT WITHOUT CONTRAST TECHNIQUE: Multiplanar, multisequence MR imaging of the left was performed. No intravenous contrast was administered. COMPARISON:  Multiple radiographs, most recent radiograph same day FINDINGS: Bones/Joint/Cartilage Again noted are chronic erosive type changes seen at the base of the first MTP joint there is stable dorsal on lateral subluxation of the metatarsals at the TMT joint joints with erosive type changes and chronic disruption of the Lisfranc ligaments. There is small erosive type changes seen throughout the cuneiform. There is no acute marrow signal abnormality seen throughout the osseous structures however. No large joint effusions are present. Probable healed fracture deformity of the fifth metatarsal base seen. There is diffuse chronic periosteal reaction seen  through the second through fourth metatarsal shafts. Ligaments Chronic disruptions of the Lisfranc ligaments as described above. Muscles and Tendons Diffuse fatty atrophy with denervation seen throughout the forefoot. The flexor and extensor tendons appear to be grossly intact, however there is surrounding fluid signal on surrounding the flexor digitorum tendons. Soft tissues On the  medial plantar forefoot there is areas of superficial ulceration with diffuse skin thickening and subcutaneous edema seen. No loculated fluid collections or sinus tract however are noted. There is diffuse dorsal subcutaneous edema seen on the dorsum of the forefoot. IMPRESSION: 1. 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 No definite evidence acute osteomyelitis. 2. Area superficial ulceration on the plantar base beneath the first metatarsal. No definite loculated fluid collection or sinus tract. 3. Diffuse subcutaneous edema and cellulitis surrounding the forefoot. Electronically Signed   By: Prudencio Pair M.D.   On: 10/15/2019 22:27   DG Chest Port 1 View  Result Date: 10/18/2019 CLINICAL DATA:  Shortness of breath. EXAM: PORTABLE CHEST 1 VIEW COMPARISON:  10/15/2019. FINDINGS: Severe cardiomegaly. Diffuse bilateral pulmonary infiltrates/edema. No pleural effusion or pneumothorax. No acute bony abnormality. Identified. IMPRESSION: Severe cardiomegaly.  Diffuse bilateral pulmonary infiltrates/edema. Electronically Signed   By: Marcello Moores  Register   On: 10/18/2019 08:35   DG Chest Port 1 View  Result Date: 10/15/2019 CLINICAL DATA:  Lower extremity edema EXAM: PORTABLE CHEST 1 VIEW COMPARISON:  05/22/2019 FINDINGS: Very low lung volumes limiting evaluation. Pulmonary vascular congestion with possible mild perihilar edema. No significant pleural effusion. No pneumothorax. Stable cardiomegaly. IMPRESSION: Pulmonary vascular congestion with possible mild edema. Electronically Signed   By: Macy Mis M.D.   On: 10/15/2019 09:11   DG Foot Complete Left  Result Date: 10/15/2019 CLINICAL DATA:  Nonhealing wound to left foot. EXAM: LEFT FOOT - COMPLETE 3+ VIEW COMPARISON:  August 29, 2018. FINDINGS: Vascular calcifications are noted. Continued chronic dislocations degenerative changes seen involving the tarsometatarsal joints. Stable defect  of middle portion of second middle phalanx is noted. Diffuse soft tissue swelling is noted. No acute lytic destruction is seen to suggest osteomyelitis. IMPRESSION: Stable chronic findings as described above. No acute lytic destruction is seen to suggest osteomyelitis. Diffuse soft tissue swelling is noted. Electronically Signed   By: Marijo Conception M.D.   On: 10/15/2019 09:30   ECHOCARDIOGRAM COMPLETE  Result Date: 10/15/2019    ECHOCARDIOGRAM REPORT   Patient Name:   Joshua Massey Date of Exam: 10/15/2019 Medical Rec #:  [id number]        Height:       71.0 in Accession #:    [id number]       Weight:       299.8 lb Date of Birth:  February 04, 1976        BSA:          2.504 m Patient Age:    67 years         BP:           144/99 mmHg Patient Gender: M                HR:           92 bpm. Exam Location:  Inpatient Procedure: 2D Echo, Cardiac Doppler and Color Doppler Indications:    CHF-Acute Systolic 284.13/K44.01  History:        Patient has prior history of Echocardiogram examinations. CHF,  CAD; Risk Factors:Dyslipidemia. Non-ischemi cardiomyopathy. CKD.  Sonographer:    Clayton Lefort RDCS (AE) Referring Phys: [id number] Lequita Halt  Sonographer Comments: Patient is morbidly obese. IMPRESSIONS  1. Severe global reduction in LV systolic function; restrictive filling; 4 chamber enlargement; severe RV dysfunction; mild MR; moderate TR; moderate pulmonary hypertension; small pericardial effusion.  2. Left ventricular ejection fraction, by estimation, is 20 to 25%. The left ventricle has severely decreased function. The left ventricle demonstrates global hypokinesis. The left ventricular internal cavity size was moderately dilated. There is mild left ventricular hypertrophy. Left ventricular diastolic parameters are consistent with Grade III diastolic dysfunction (restrictive).  3. Right ventricular systolic function is severely reduced. The right ventricular size is mildly enlarged. There is moderately  elevated pulmonary artery systolic pressure.  4. Left atrial size was mildly dilated.  5. Right atrial size was mildly dilated.  6. The mitral valve is normal in structure. Mild mitral valve regurgitation. No evidence of mitral stenosis.  7. Tricuspid valve regurgitation is moderate.  8. The aortic valve has an indeterminant number of cusps. Aortic valve regurgitation is not visualized. No aortic stenosis is present.  9. The inferior vena cava is dilated in size with <50% respiratory variability, suggesting right atrial pressure of 15 mmHg. FINDINGS  Left Ventricle: Left ventricular ejection fraction, by estimation, is 20 to 25%. The left ventricle has severely decreased function. The left ventricle demonstrates global hypokinesis. The left ventricular internal cavity size was moderately dilated. There is mild left ventricular hypertrophy. Left ventricular diastolic parameters are consistent with Grade III diastolic dysfunction (restrictive). Right Ventricle: The right ventricular size is mildly enlarged.Right ventricular systolic function is severely reduced. There is moderately elevated pulmonary artery systolic pressure. The tricuspid regurgitant velocity is 3.30 m/s, and with an assumed right atrial pressure of 15 mmHg, the estimated right ventricular systolic pressure is 42.7 mmHg. Left Atrium: Left atrial size was mildly dilated. Right Atrium: Right atrial size was mildly dilated. Pericardium: A small pericardial effusion is present. Mitral Valve: The mitral valve is normal in structure. Normal mobility of the mitral valve leaflets. Mild mitral valve regurgitation. No evidence of mitral valve stenosis. Tricuspid Valve: The tricuspid valve is normal in structure. Tricuspid valve regurgitation is moderate . No evidence of tricuspid stenosis. Aortic Valve: The aortic valve has an indeterminant number of cusps. Aortic valve regurgitation is not visualized. No aortic stenosis is present. Aortic valve mean gradient  measures 3.2 mmHg. Aortic valve peak gradient measures 6.6 mmHg. Aortic valve area, by VTI measures 2.71 cm. Pulmonic Valve: The pulmonic valve was not well visualized. Pulmonic valve regurgitation is trivial. No evidence of pulmonic stenosis. Aorta: The aortic root is normal in size and structure. Venous: The inferior vena cava is dilated in size with less than 50% respiratory variability, suggesting right atrial pressure of 15 mmHg. IAS/Shunts: No atrial level shunt detected by color flow Doppler. Additional Comments: Severe global reduction in LV systolic function; restrictive filling; 4 chamber enlargement; severe RV dysfunction; mild MR; moderate TR; moderate pulmonary hypertension; small pericardial effusion.  LEFT VENTRICLE PLAX 2D LVIDd:         6.16 cm LVIDs:         5.03 cm LV PW:         1.38 cm LV IVS:        1.53 cm LVOT diam:     2.30 cm LV SV:         55 LV SV Index:   22 LVOT Area:  4.15 cm  LV Volumes (MOD) LV vol d, MOD A2C: 318.0 ml LV vol d, MOD A4C: 215.0 ml LV vol s, MOD A2C: 232.0 ml LV vol s, MOD A4C: 177.0 ml LV SV MOD A2C:     86.0 ml LV SV MOD A4C:     215.0 ml LV SV MOD BP:      57.1 ml RIGHT VENTRICLE            IVC RV Basal diam:  4.02 cm    IVC diam: 3.59 cm RV Mid diam:    4.69 cm RV S prime:     6.31 cm/s TAPSE (M-mode): 0.8 cm LEFT ATRIUM             Index       RIGHT ATRIUM           Index LA diam:        5.20 cm 2.08 cm/m  RA Area:     23.90 cm LA Vol (A2C):   58.1 ml 23.20 ml/m RA Volume:   72.00 ml  28.75 ml/m LA Vol (A4C):   98.6 ml 39.37 ml/m LA Biplane Vol: 84.0 ml 33.54 ml/m  AORTIC VALVE AV Area (Vmax):    2.89 cm AV Area (Vmean):   2.79 cm AV Area (VTI):     2.71 cm AV Vmax:           128.60 cm/s AV Vmean:          85.780 cm/s AV VTI:            0.202 m AV Peak Grad:      6.6 mmHg AV Mean Grad:      3.2 mmHg LVOT Vmax:         89.40 cm/s LVOT Vmean:        57.620 cm/s LVOT VTI:          0.132 m LVOT/AV VTI ratio: 0.65  AORTA Ao Root diam: 3.30 cm Ao Asc diam:   3.60 cm TRICUSPID VALVE TR Peak grad:   43.6 mmHg TR Vmax:        330.00 cm/s  SHUNTS Systemic VTI:  0.13 m Systemic Diam: 2.30 cm Kirk Ruths MD Electronically signed by Kirk Ruths MD Signature Date/Time: 10/15/2019/4:19:33 PM    Final

## 2019-10-18 NOTE — Progress Notes (Signed)
Foley bag leaking d/t hole found. Seal broken and new standard urine drainage bag placed.

## 2019-10-18 NOTE — Plan of Care (Signed)
  Problem: Clinical Measurements: Goal: Respiratory complications will improve Outcome: Progressing   

## 2019-10-19 LAB — COMPREHENSIVE METABOLIC PANEL
ALT: 13 U/L (ref 0–44)
AST: 31 U/L (ref 15–41)
Albumin: 2.8 g/dL — ABNORMAL LOW (ref 3.5–5.0)
Alkaline Phosphatase: 87 U/L (ref 38–126)
Anion gap: 13 (ref 5–15)
BUN: 102 mg/dL — ABNORMAL HIGH (ref 6–20)
CO2: 18 mmol/L — ABNORMAL LOW (ref 22–32)
Calcium: 7.8 mg/dL — ABNORMAL LOW (ref 8.9–10.3)
Chloride: 109 mmol/L (ref 98–111)
Creatinine, Ser: 4.98 mg/dL — ABNORMAL HIGH (ref 0.61–1.24)
GFR calc Af Amer: 15 mL/min — ABNORMAL LOW (ref >60)
GFR calc non Af Amer: 13 mL/min — ABNORMAL LOW (ref >60)
Glucose, Bld: 130 mg/dL — ABNORMAL HIGH (ref 70–99)
Potassium: 5.1 mmol/L (ref 3.5–5.1)
Sodium: 140 mmol/L (ref 135–145)
Total Bilirubin: 1.5 mg/dL — ABNORMAL HIGH (ref 0.3–1.2)
Total Protein: 7 g/dL (ref 6.5–8.1)

## 2019-10-19 LAB — RENAL FUNCTION PANEL
Albumin: 2.7 g/dL — ABNORMAL LOW (ref 3.5–5.0)
Albumin: 2.6 g/dL — ABNORMAL LOW (ref 3.5–5.0)
Anion gap: 13 (ref 5–15)
Anion gap: 12 (ref 5–15)
BUN: 85 mg/dL — ABNORMAL HIGH (ref 6–20)
BUN: 79 mg/dL — ABNORMAL HIGH (ref 6–20)
CO2: 21 mmol/L — ABNORMAL LOW (ref 22–32)
CO2: 21 mmol/L — ABNORMAL LOW (ref 22–32)
Calcium: 7.9 mg/dL — ABNORMAL LOW (ref 8.9–10.3)
Calcium: 7.7 mg/dL — ABNORMAL LOW (ref 8.9–10.3)
Chloride: 108 mmol/L (ref 98–111)
Chloride: 106 mmol/L (ref 98–111)
Creatinine, Ser: 4.09 mg/dL — ABNORMAL HIGH (ref 0.61–1.24)
Creatinine, Ser: 3.98 mg/dL — ABNORMAL HIGH (ref 0.61–1.24)
GFR calc Af Amer: 20 mL/min — ABNORMAL LOW (ref >60)
GFR calc Af Amer: 20 mL/min — ABNORMAL LOW (ref >60)
GFR calc non Af Amer: 17 mL/min — ABNORMAL LOW (ref >60)
GFR calc non Af Amer: 17 mL/min — ABNORMAL LOW (ref >60)
Glucose, Bld: 114 mg/dL — ABNORMAL HIGH (ref 70–99)
Glucose, Bld: 122 mg/dL — ABNORMAL HIGH (ref 70–99)
Phosphorus: 5.4 mg/dL — ABNORMAL HIGH (ref 2.5–4.6)
Phosphorus: 5 mg/dL — ABNORMAL HIGH (ref 2.5–4.6)
Potassium: 4.8 mmol/L (ref 3.5–5.1)
Potassium: 5 mmol/L (ref 3.5–5.1)
Sodium: 142 mmol/L (ref 135–145)
Sodium: 139 mmol/L (ref 135–145)

## 2019-10-19 LAB — APTT: aPTT: 85 seconds — ABNORMAL HIGH (ref 24–36)

## 2019-10-19 LAB — GLUCOSE, CAPILLARY
Glucose-Capillary: 105 mg/dL — ABNORMAL HIGH (ref 70–99)
Glucose-Capillary: 109 mg/dL — ABNORMAL HIGH (ref 70–99)
Glucose-Capillary: 109 mg/dL — ABNORMAL HIGH (ref 70–99)
Glucose-Capillary: 127 mg/dL — ABNORMAL HIGH (ref 70–99)

## 2019-10-19 LAB — TSH: TSH: 2.828 u[IU]/mL (ref 0.350–4.500)

## 2019-10-19 LAB — HEPARIN LEVEL (UNFRACTIONATED): Heparin Unfractionated: 1.22 IU/mL — ABNORMAL HIGH (ref 0.30–0.70)

## 2019-10-19 LAB — MAGNESIUM: Magnesium: 2.4 mg/dL (ref 1.7–2.4)

## 2019-10-19 MED ORDER — CALCIUM GLUCONATE-NACL 1-0.675 GM/50ML-% IV SOLN
1.0000 g | Freq: Once | INTRAVENOUS | Status: AC
  Administered 2019-10-19: 1000 mg via INTRAVENOUS
  Filled 2019-10-19: qty 50

## 2019-10-19 NOTE — Progress Notes (Signed)
[place] for IV Heparin Indication: atrial fibrillation  No Known Allergies  Patient Measurements: Height: 5\' 11"  (180.3 cm) Weight: (!) 165.6 kg (365 lb 1.3 oz) IBW/kg (Calculated) : 75.3 Heparin Dosing Weight: 106 kg  Vital Signs: Temp: 97.2 F (36.2 C) (05/01 0800) Temp Source: Axillary (05/01 0800) BP: 115/87 (05/01 0800) Pulse Rate: 66 (05/01 0800)  Labs: Recent Labs    10/16/19 1930 10/16/19 1930 10/17/19 0413 10/17/19 1633 10/18/19 0222 10/18/19 0222 10/18/19 1346 10/18/19 1540 10/18/19 2200 10/19/19 0713  HGB  --   --  9.4*  --  9.2*  --   --   --   --   --   HCT  --   --  31.0*  --  30.1*  --   --   --   --   --   PLT  --   --  146*  --  153  --   --   --   --   --   APTT 90*   < > 187*   < > 126*   < > 98*  --  95* 85*  HEPARINUNFRC >2.20*  --  >2.20*  --  1.98*  --   --   --   --   --   CREATININE  --    < > 5.48*  --  5.63*  --   --  5.74* 4.98*  --    < > = values in this interval not displayed.    Estimated Creatinine Clearance: 30.4 mL/min (A) (by C-G formula based on SCr of 4.98 mg/dL (H)).   Assessment: 44 yr old male presented with severe fluid overload and was found to have COVID. He is on apixaban PTA for hx a fib (last dose on 4/27@1407 ). Scr continues to increase from 4.81 to 5.48 (pt refusing HD). Pharmacy was consulted to transition pt from apixaban to IV heparin.  Given recent apixaban dosing, will monitor both aPTT and heparin level until correlated.   PTT therapeutic (85 sec) on gtt at 900 units/hr. Heparin remains high at 1.22 due to recent apixaban. No bleeding noted.  Goal of Therapy:  Heparin level 0.3-0.7 units/ml aPTT 66-102 seconds Monitor platelets by anticoagulation protocol: Yes   Plan:  Continue heparin infusion at 900 units/hr Daily HL, APTT, CBC  Sloan Leiter, PharmD, BCPS, BCCCP Clinical Pharmacist Please refer to [facility] for [place] numbers 10/19/2019, 8:38 AM

## 2019-10-19 NOTE — Progress Notes (Signed)
[facility] Progress Note  Name: Joshua Massey MRN: [id number] DOB: 05-Sep-1975  Chief Complaint:  Presented with shortness of breath and swelling and found to have covid   Subjective:   He was started on CRRT on 4/30 afternoon after nontunneled catheter placement.  Seen and examined on CRRT.  Procedure supervised. he had 1.2 liters UF over 4/30 with CRRT as charted.  Has been net neg 100 ml/hr as tolerated.  He had 900 mL UOP over 4/30.  Got lasix 160 mg IV as well as albumin and metolazone 5 mg once.  States his breathing is a little better.  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 Atrial fibrillation on Eliquis, who presented with progressive increase in lower ext edema and redness, abdominal girth, DOE, orthopnea, and weight gain after he ran out of his torsemide. He was also noted to be covid-19 +.  He was admitted with acute on chronic CHF and we were consulted to further evaluate and manage his AKI/CKD stage 4-5 presumably due to cardiorenal syndrome. The trend in Scr is seen below.  Of note, he has had multiple admissions with a similar presentation, most recently on 05/22/19 at that time he declined to have vascular access placed in preparation for dialysis despite his advanced CKD.  He also failed to follow up in our office or with the heart failure clinic.  When speaking with him today about his worsening renal function and lack of response to a lasix drip, and likely need for dialysis he declined proceeding with HD catheter placement today.  He is not sure he would go on dialysis and understands the  consequences.  However when asked if he was ok with dying of kidney failure he reluctantly stated, "not really".     Intake/Output Summary (Last 24 hours) at 10/19/2019 0638 Last data filed at 10/19/2019 0600 Gross per 24 hour  Intake 715.48 ml  Output 2106 ml  Net -1390.52 ml    Vitals:  Vitals:   10/19/19 0400 10/19/19 0430 10/19/19 0500 10/19/19 0530  BP: 117/77 107/76 (!) 143/86 (!) 127/107  Pulse: 65 66 68 68  Resp: 12 11 15 14   Temp: (!) 97.3 F (36.3 C)     TempSrc: Axillary     SpO2: 92% 91% 92% 95%  Weight:      Height:         Physical Exam: General adult male in bed on CRRT HEENT normocephalic atraumatic extraocular movements intact sclera anicteric Neck increased circumference trachea midline Lungs coarse and reduced breath sounds; on suppl oxygen 6 liters Heart S1S2 no rub Abdomen obese habitus anasarca Extremities anasarca  Neuro - alert and oriented; provides hx and follows commands Access RIJ nontunneled catheter    Medications reviewed   Labs:  BMP Latest Ref Rng & Units 10/18/2019 10/18/2019 10/18/2019  Glucose 70 - 99 mg/dL 130(H) 128(H) 110(H)  BUN 6 - 20 mg/dL 102(H) 117(H) 111(H)  Creatinine 0.61 - 1.24 mg/dL 4.98(H) 5.74(H) 5.63(H)  Sodium 135 - 145 mmol/L 140 141 141  Potassium 3.5 - 5.1 mmol/L 5.1 5.2(H) 4.9  Chloride 98 - 111 mmol/L 109 107 110  CO2 22 - 32 mmol/L 18(L) 20(L) 25  Calcium 8.9 - 10.3 mg/dL 7.8(L) 7.9(L) 7.9(L)     Assessment/Plan:   Assessment/Plan: 1. 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  He has had several episodes over the past year similar to this and usually due to noncompliance with his diuretics and follow up.  He was started on a lasix drip on 10/15/19 without much improvement despite multiple doses of metolazone.  Started CRRT on 4/30 after nontunneled catheter with critical care  1. Continue CRRT; 4K fluids and goal UF of net  negative 100 to 150 an hour as tolerated.    2. Do not feel that he would tolerate intermittent HD right now and may ultimately poorly tolerate given his combined diastolic and systolic CHF.  Attempt transition once volume status optimized with CRRT  2. Acute on chronic combined diastolic and systolic CHF- due to noncompliance with diuretics.  Significant anasarca.  Echo with EF 77-93% EF, RV systolic function severely reduced and grade III DD (restrictive).  Plan as outlined above. 3. Acute hypoxic respiratory failure due to #2 and covid-19 Pneumonia.  Optimize volume as above 4. covid - therapies per primary team  5. Metabolic acidosis - stop bicarb and on CRRT 6. Left charcot join with plantar ulcer- followed by Dr. Sharol Given 7. P. Atrial fib- on amio per Cardiology. 8. Anemia of CKD - no acute indication for PRBC's .  aranesp 40 mcg once on 4/30  9. Medical noncompliance- ongoing issue, poor health literacy and insight. 10. Disposition- poor overall prognosis.  Not certain he would tolerate intermittent HD given biventricular heart failure and low bp's.     Do also recommend palliative care consult to help set goals/limits of care.  Claudia Desanctis, MD 10/19/2019 7:02 AM

## 2019-10-19 NOTE — Progress Notes (Signed)
Assisted tele visit to patient with family member.  Bedie Dominey P, RN  

## 2019-10-19 NOTE — Progress Notes (Signed)
PROGRESS NOTE                                                                                                                                                                                                             Patient Demographics:    Joshua Massey, is a 44 y.o. male, DOB - Apr 02, 1976, UMP:[id number]  Outpatient Primary MD for the patient is [name], [name], MD    LOS - 4  Admit date - 10/15/2019    Chief Complaint  Patient presents with   Leg Swelling   Abdominal Pain       Brief Narrative  - Joshua Massey is a 44 y.o. male with medical history significant of IDDM, HTN, and chronic systolic heart failure EF 20-25%,CKD stage IV, morbid obesity, poor compliance with medications, presented with increasing redness and increasing short of breath for 3 weeks, he ran out of his diuretics few weeks ago.  In the ER he was diagnosed with acute on chronic combined systolic diastolic heart failure with massive fluid overload, AKI on CKD 4, incidental Covid infection and admitted to the hospital.   Subjective:   Patient in bed, appears comfortable, denies any headache, no fever, no chest pain or pressure, no shortness of breath , no abdominal pain. No focal weakness.   Assessment  & Plan :      1. Acute Hypoxic Resp. Failure due to severe acute on chronic combined systolic and diastolic heart failure with EF of 25% along with AKI on CKD 5 with anasarca.  He was seen by cardiology and nephrology, despite aggressive diuretics his urine output was minimal and finally on 10/18/2019 he was transferred to ICU, right IJ dialysis catheter was placed by critical care and CRRT was started by the nephrology team.  He is a poor candidate for long-term HD due to biventricular heart failure. Continue combination of amiodarone, Coreg, BiDil and diuretics.  Renal function precludes the use of ACE/ARB or Entresto.   Acute Covid 19 Viral  Pneumonitis during the ongoing 2020 Covid 19 Pandemic - this seems to be incidental finding will monitor. Encouraged the patient to sit up in chair in the daytime use I-S and flutter valve for pulmonary toiletry and then prone in bed when at night.  Will advance activity and titrate down oxygen as possible. Low grade fever 10/18/19  with mild rise in Procalcitonin - could be from Covid, check 2 set  and monitor, appears non toxic.  So far cultures negative and appears stable.  Will monitor.   Recent Labs  Lab 10/15/19 1107 10/15/19 1201 10/15/19 1702 10/16/19 0406 10/16/19 0733 10/17/19 0413 10/18/19 0222  CRP  --   --  5.1* 5.2*  --  5.7* 5.6*  DDIMER  --   --  15.09* 13.60*  --  13.94* 13.49*  BNP 1,305.3*  --   --   --   --  1,504.6* 2,531.6*  PROCALCITON  --   --  0.61  --  0.68 0.75 1.08  SARSCOV2NAA  --  POSITIVE*  --   --   --   --   --      3.  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  No signs of osteomyelitis on MRI, no signs of cellulitis, seen by orthopedic surgeon Dr. Sharol Given who recommends medical treatment, currently no signs of active infection, continue wound care with outpatient follow-up with Dr. Sharol Given post discharge.  4.  AKI on CKD 4 with anasarca.  Baseline creatinine close to 4.7.  Was oliguric despite high-dose Lasix now started on CRRT by nephrology on 10/18/2019, PCCM assisted with right IJ catheter placement, continue fluid removal. Due to massive anasarca will also check baseline TSH and random am cortisol.  5.  Dyslipidemia.  Continue statin.  6.  Essential hypertension.  Continue home dose Coreg and BiDil combination along with diuretics for now.  Monitor and adjust.   7.  Paroxysmal atrial fibrillation [place] vas 2 score of greater than 3.  For now on amiodarone and beta-blocker for rate control, heparin drip.  Will monitor.  Cardiology on board.   8.  Anemia of chronic disease.  No signs of active bleeding, anemia panel, oral iron supplement, PPI and  monitor.  9. ++ High D-dimer.  Continue on heparin drip for now which he is on for paroxysmal A. Fib.  Not doing any imaging for DVT as management will not change he is already on full anticoagulation and needs to be on it for his high [place] vas 2 score.    Condition - Fair  Family Communication  :    Mother [phone] on 10/17/19 at 9:35 AM.  Message left. Wife Joshua Massey [phone] on 10/17/2019  Code Status :  Full  Diet :   Diet Order            Diet renal with fluid restriction Fluid restriction: 1200 mL Fluid; Room service appropriate? Yes; Fluid consistency: Thin  Diet effective now               Disposition Plan  : Stay in the inpatient hospital in ICU for severe acute on chronic combined systolic and diastolic CHF, AKI on CKD 4, COVID-19 infection.  Thereafter discharge home once medically stable. CRRT initiated as well.  Status is: Inpatient  Remains inpatient appropriate because:IV treatments appropriate due to intensity of illness or inability to take PO   Dispo: The patient is from: Home              Anticipated d/c is to: Home              Anticipated d/c date is: > 3 days              Patient currently is not medically stable to d/c.   Consults  :  Cards, Ortho, Renal, PCCM  Procedures  :  R.IJ HD Catheter - PCCM 10/18/19  MRI left foot.  No osteomyelitis. Charcot joints.  TTE.  1. Severe global reduction in LV systolic function; restrictive filling; 4 chamber enlargement; severe RV dysfunction; mild MR; moderate TR; moderate pulmonary hypertension; small pericardial effusion. 2. Left ventricular ejection fraction, by estimation, is 20 to 25%. The left ventricle has severely decreased function. The left ventricle demonstrates global hypokinesis. The left ventricular internal cavity size was moderately dilated. There is mild left ventricular hypertrophy. Left ventricular diastolic parameters are consistent with Grade III diastolic dysfunction  (restrictive).  3. Right ventricular systolic function is severely reduced. The right ventricular size is mildly enlarged. There is moderately elevated pulmonary artery systolic pressure.  4. Left atrial size was mildly dilated.  5. Right atrial size was mildly dilated.  6. The mitral valve is normal in structure. Mild mitral valve regurgitation. No evidence of mitral stenosis.  7. Tricuspid valve regurgitation is moderate.  8. The aortic valve has an indeterminant number of cusps. Aortic valve regurgitation is not visualized. No aortic stenosis is present.  9. The inferior vena cava is dilated in size with <50% respiratory variability, suggesting right atrial pressure of 15 mmHg.  PUD Prophylaxis :   DVT Prophylaxis  :   Heparin  Lab Results  Component Value Date   PLT 153 10/18/2019    Inpatient Medications  Scheduled Meds:  amiodarone  200 mg Oral Daily   atorvastatin  10 mg Oral Q supper   carvedilol  25 mg Oral BID WC   Chlorhexidine Gluconate Cloth  6 each Topical Daily   darbepoetin (ARANESP) injection - NON-DIALYSIS  40 mcg Subcutaneous Once   ferrous sulfate  325 mg Oral BID WC   insulin aspart  0-15 Units Subcutaneous TID WC   insulin aspart  0-5 Units Subcutaneous QHS   isosorbide-hydrALAZINE  1 tablet Oral BID   methylPREDNISolone (SOLU-MEDROL) injection  40 mg Intravenous Daily   multivitamin with minerals  1 tablet Oral Daily   pantoprazole  40 mg Oral Daily   sodium chloride flush  3 mL Intravenous Q12H   Continuous Infusions:   prismasol BGK 4/2.5 400 mL/hr at 10/18/19 1632    prismasol BGK 4/2.5 300 mL/hr at 10/18/19 1632   sodium chloride Stopped (10/18/19 1757)   heparin 900 Units/hr (10/19/19 0800)   prismasol BGK 4/2.5 1,800 mL/hr at 10/19/19 1610   remdesivir 100 mg in NS 100 mL Stopped (10/18/19 1037)   PRN Meds:.sodium chloride, acetaminophen, gabapentin, heparin, LORazepam, ondansetron (ZOFRAN) IV  Antibiotics  :     Anti-infectives (From admission, onward)   Start     Dose/Rate Route Frequency Ordered Stop   10/18/19 1000  remdesivir 100 mg in sodium chloride 0.9 % 100 mL IVPB     100 mg 200 mL/hr over 30 Minutes Intravenous Daily 10/17/19 0707 10/22/19 0959   10/17/19 0800  remdesivir 200 mg in sodium chloride 0.9% 250 mL IVPB     200 mg 580 mL/hr over 30 Minutes Intravenous Once 10/17/19 0707 10/17/19 1906       Time Spent in minutes  30   Lala Lund M.D on 10/19/2019 at 8:59 AM  To page go to [email or web address] - password [facility]  [facility] -  Office  [phone]   See all Orders from today for further details    Objective:   Vitals:   10/19/19 0630 10/19/19 0700 10/19/19 0737 10/19/19 0800  BP: 121/80 (!) 130/104  115/87  Pulse: 64 68  66  Resp: 11 20  14   Temp:   (!) 97.4 F (36.3 C) (!) 97.2 F (36.2 C)  TempSrc:   Oral Axillary  SpO2: 94% 90%  92%  Weight:      Height:        Wt Readings from Last 3 Encounters:  10/18/19 (!) 165.6 kg  06/04/19 (!) 136.4 kg  12/10/18 (!) 142.9 kg     Intake/Output Summary (Last 24 hours) at 10/19/2019 0859 Last data filed at 10/19/2019 0800 Gross per 24 hour  Intake 680.47 ml  Output 2302 ml  Net -1621.53 ml     Physical Exam  Awake Alert, No new F.N deficits,  [place].AT,PERRAL Supple Neck,No JVD, No cervical lymphadenopathy appriciated.  Symmetrical Chest wall movement, Good air movement bilaterally, CTAB RRR,No Gallops, Rubs or new Murmurs, No Parasternal Heave +ve B.Sounds, Abd Soft, No tenderness,    3 + edema in the scrotum and lower extremities, R.IJ HD Catheter, left foot plantar ulcer noted, no pus or surrounding infection, few R Leg open sores    Data Review:    CBC Recent Labs  Lab 10/05/2019 2206 10/16/19 0406 10/17/19 0413 10/18/19 0222  WBC 4.5 4.5 4.0 4.2  HGB 10.2* 9.7* 9.4* 9.2*  HCT 33.0* 31.4* 31.0* 30.1*  PLT 175 152 146* 153  MCV 98.5 97.8 98.4 98.0  MCH 30.4 30.2 29.8 30.0  MCHC 30.9 30.9  30.3 30.6  RDW 16.4* 16.5* 16.8* 16.6*    Chemistries  Recent Labs  Lab 09/22/2019 2206 09/28/2019 2206 10/15/19 1702 10/16/19 0406 10/17/19 0413 10/18/19 0222 10/18/19 1540 10/18/19 2134 10/18/19 2200  NA 141   < >  --  141 142 141 141  --  140  K 4.6   < >  --  4.7 4.7 4.9 5.2*  --  5.1  CL 108   < >  --  108 108 110 107  --  109  CO2 19*   < >  --  18* 21* 25 20*  --  18*  GLUCOSE 103*   < >  --  118* 107* 110* 128*  --  130*  BUN 95*   < >  --  100* 104* 111* 117*  --  102*  CREATININE 4.81*   < >  --  5.13* 5.48* 5.63* 5.74*  --  4.98*  CALCIUM 8.3*   < >  --  8.1* 7.9* 7.9* 7.9*  --  7.8*  AST 37  --   --   --   --   --   --   --  31  ALT 17  --   --   --   --   --   --   --  13  ALKPHOS 90  --   --   --   --   --   --   --  87  BILITOT 1.3*  --   --   --   --   --   --   --  1.5*  MG  --   --   --  2.0 2.1 2.2  --   --   --   AMMONIA  --   --   --   --   --   --   --  53*  --   HGBA1C  --   --  6.3*  --   --   --   --   --   --    < > =  values in this interval not displayed.     ------------------------------------------------------------------------------------------------------------------ No results for input(s): CHOL, HDL, LDLCALC, TRIG, CHOLHDL, LDLDIRECT in the last 72 hours.  Lab Results  Component Value Date   HGBA1C 6.3 (H) 10/15/2019   ------------------------------------------------------------------------------------------------------------------ No results for input(s): TSH, T4TOTAL, T3FREE, THYROIDAB in the last 72 hours.  Invalid input(s): FREET3  Cardiac Enzymes No results for input(s): CKMB, TROPONINI, MYOGLOBIN in the last 168 hours.  Invalid input(s): CK ------------------------------------------------------------------------------------------------------------------    Component Value Date/Time   BNP 2,531.6 (H) 10/18/2019 0222   BNP 15.0 01/01/2013 1212    Micro Results Recent Results (from the past 240 hour(s))  Respiratory Panel by  RT PCR (Flu A&B, Covid) - Nasopharyngeal Swab     Status: Abnormal   Collection Time: 10/15/19 12:01 PM   Specimen: Nasopharyngeal Swab  Result Value Ref Range Status   SARS Coronavirus 2 by RT PCR POSITIVE (A) NEGATIVE Final    Comment: RESULT CALLED TO, READ BACK BY AND VERIFIED WITH: Benard Halsted RN 14:30 10/15/19 ([email or web address]) (NOTE) SARS-CoV-2 target nucleic acids are DETECTED. SARS-CoV-2 RNA is generally detectable in upper respiratory specimens  during the acute phase of infection. Positive results are indicative of the presence of the identified virus, but do not rule out bacterial infection or co-infection with other pathogens not detected by the test. Clinical correlation with patient history and other diagnostic information is necessary to determine patient infection status. The expected result is Negative. Fact Sheet for Patients:  PinkCheek.be Fact Sheet for Healthcare Providers: GravelBags.it This test is not yet approved or cleared by the [place] FDA and  has been authorized for detection and/or diagnosis of SARS-CoV-2 by FDA under an Emergency Use Authorization (EUA).  This EUA will remain in effect (meaning this test can be used) f or the duration of  the COVID-19 declaration under Section 564(b)(1) of the Act, 21 U.S.C. section 360bbb-3(b)(1), unless the authorization is terminated or revoked sooner.    Influenza A by PCR NEGATIVE NEGATIVE Final   Influenza B by PCR NEGATIVE NEGATIVE Final    Comment: (NOTE) The Xpert Xpress SARS-CoV-2/FLU/RSV assay is intended as an aid in  the diagnosis of influenza from Nasopharyngeal swab specimens and  should not be used as a sole basis for treatment. Nasal washings and  aspirates are unacceptable for Xpert Xpress SARS-CoV-2/FLU/RSV  testing. Fact Sheet for Patients: PinkCheek.be Fact Sheet for Healthcare  Providers: GravelBags.it This test is not yet approved or cleared by the [place] FDA and  has been authorized for detection and/or diagnosis of SARS-CoV-2 by  FDA under an Emergency Use Authorization (EUA). This EUA will remain  in effect (meaning this test can be used) for the duration of the  Covid-19 declaration under Section 564(b)(1) of the Act, 21  U.S.C. section 360bbb-3(b)(1), unless the authorization is  terminated or revoked. Performed at [facility], [place] [address], [place], [place] [address]   MRSA PCR Screening     Status: None   Collection Time: 10/16/19  3:12 PM   Specimen: Nasal Mucosa; Nasopharyngeal  Result Value Ref Range Status   MRSA by PCR NEGATIVE NEGATIVE Final    Comment:        The GeneXpert MRSA Assay (FDA approved for NASAL specimens only), is one component of a comprehensive MRSA colonization surveillance program. It is not intended to diagnose MRSA infection nor to guide or monitor treatment for MRSA infections. Performed at [facility], [place] [address], [place], [place]  [address]   Culture, blood (routine x 2)     Status: None (Preliminary result)   Collection Time: 10/18/19 10:21 AM   Specimen: BLOOD RIGHT HAND  Result Value Ref Range Status   Specimen Description BLOOD RIGHT HAND  Final   Special Requests   Final    BOTTLES DRAWN AEROBIC AND ANAEROBIC Blood Culture adequate volume   Culture   Final    NO GROWTH < 24 HOURS Performed at [facility], [place] [address] [place], [place] [address]    Report Status PENDING  Incomplete  Culture, blood (routine x 2)     Status: None (Preliminary result)   Collection Time: 10/18/19 10:22 AM   Specimen: BLOOD RIGHT FOREARM  Result Value Ref Range Status   Specimen Description BLOOD RIGHT FOREARM  Final   Special Requests   Final    BOTTLES DRAWN AEROBIC AND ANAEROBIC Blood Culture adequate volume   Culture   Final    NO GROWTH < 24  HOURS Performed at [facility], [place] [address], [place], [place] [address]    Report Status PENDING  Incomplete    Radiology Reports MR FOOT LEFT WO CONTRAST  Result Date: 10/15/2019 CLINICAL DATA:  Bilateral lower extremity swelling, question of osteomyelitis ulcer on the bottom of the left forefoot EXAM: MRI OF THE LEFT FOOT WITHOUT CONTRAST TECHNIQUE: Multiplanar, multisequence MR imaging of the left was performed. No intravenous contrast was administered. COMPARISON:  Multiple radiographs, most recent radiograph same day FINDINGS: Bones/Joint/Cartilage Again noted are chronic erosive type changes seen at the base of the first MTP joint there is stable dorsal on lateral subluxation of the metatarsals at the TMT joint joints with erosive type changes and chronic disruption of the Lisfranc ligaments. There is small erosive type changes seen throughout the cuneiform. There is no acute marrow signal abnormality seen throughout the osseous structures however. No large joint effusions are present. Probable healed fracture deformity of the fifth metatarsal base seen. There is diffuse chronic periosteal reaction seen through the second through fourth metatarsal shafts. Ligaments Chronic disruptions of the Lisfranc ligaments as described above. Muscles and Tendons Diffuse fatty atrophy with denervation seen throughout the forefoot. The flexor and extensor tendons appear to be grossly intact, however there is surrounding fluid signal on surrounding the flexor digitorum tendons. Soft tissues On the medial plantar forefoot there is areas of superficial ulceration with diffuse skin thickening and subcutaneous edema seen. No loculated fluid collections or sinus tract however are noted. There is diffuse dorsal subcutaneous edema seen on the dorsum of the forefoot. IMPRESSION: 1. 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 No definite evidence  acute osteomyelitis. 2. Area superficial ulceration on the plantar base beneath the first metatarsal. No definite loculated fluid collection or sinus tract. 3. Diffuse subcutaneous edema and cellulitis surrounding the forefoot. Electronically Signed   By: Prudencio Pair M.D.   On: 10/15/2019 22:27   DG Chest Port 1 View  Result Date: 10/18/2019 CLINICAL DATA:  Central line placement. EXAM: PORTABLE CHEST 1 VIEW COMPARISON:  Same day. FINDINGS: Stable cardiomegaly. Stable bilateral lung opacities are noted consistent with multifocal pneumonia. Hypoinflation of the lungs is noted. No pneumothorax or pleural effusion is noted. Right internal jugular catheter is noted with tip in expected position of right atrium. Bony thorax is unremarkable. IMPRESSION: Stable bilateral lung opacities are noted consistent with multifocal pneumonia. Hypoinflation of the lungs is noted. Right internal  jugular catheter is noted with tip in expected position of right atrium. Electronically Signed   By: Marijo Conception M.D.   On: 10/18/2019 15:17   DG Chest Port 1 View  Result Date: 10/18/2019 CLINICAL DATA:  Shortness of breath. EXAM: PORTABLE CHEST 1 VIEW COMPARISON:  10/15/2019. FINDINGS: Severe cardiomegaly. Diffuse bilateral pulmonary infiltrates/edema. No pleural effusion or pneumothorax. No acute bony abnormality. Identified. IMPRESSION: Severe cardiomegaly.  Diffuse bilateral pulmonary infiltrates/edema. Electronically Signed   By: Marcello Moores  Register   On: 10/18/2019 08:35   DG Chest Port 1 View  Result Date: 10/15/2019 CLINICAL DATA:  Lower extremity edema EXAM: PORTABLE CHEST 1 VIEW COMPARISON:  05/22/2019 FINDINGS: Very low lung volumes limiting evaluation. Pulmonary vascular congestion with possible mild perihilar edema. No significant pleural effusion. No pneumothorax. Stable cardiomegaly. IMPRESSION: Pulmonary vascular congestion with possible mild edema. Electronically Signed   By: Macy Mis M.D.   On: 10/15/2019  09:11   DG Foot Complete Left  Result Date: 10/15/2019 CLINICAL DATA:  Nonhealing wound to left foot. EXAM: LEFT FOOT - COMPLETE 3+ VIEW COMPARISON:  August 29, 2018. FINDINGS: Vascular calcifications are noted. Continued chronic dislocations degenerative changes seen involving the tarsometatarsal joints. Stable defect of middle portion of second middle phalanx is noted. Diffuse soft tissue swelling is noted. No acute lytic destruction is seen to suggest osteomyelitis. IMPRESSION: Stable chronic findings as described above. No acute lytic destruction is seen to suggest osteomyelitis. Diffuse soft tissue swelling is noted. Electronically Signed   By: Marijo Conception M.D.   On: 10/15/2019 09:30   ECHOCARDIOGRAM COMPLETE  Result Date: 10/15/2019    ECHOCARDIOGRAM REPORT   Patient Name:   Joshua Massey Date of Exam: 10/15/2019 Medical Rec #:  [id number]        Height:       71.0 in Accession #:    [id number]       Weight:       299.8 lb Date of Birth:  1976/05/31        BSA:          2.504 m Patient Age:    27 years         BP:           144/99 mmHg Patient Gender: M                HR:           92 bpm. Exam Location:  Inpatient Procedure: 2D Echo, Cardiac Doppler and Color Doppler Indications:    CHF-Acute Systolic 053.97/Q73.41  History:        Patient has prior history of Echocardiogram examinations. CHF,                 CAD; Risk Factors:Dyslipidemia. Non-ischemi cardiomyopathy. CKD.  Sonographer:    Clayton Lefort RDCS (AE) Referring Phys: [id number] Lequita Halt  Sonographer Comments: Patient is morbidly obese. IMPRESSIONS  1. Severe global reduction in LV systolic function; restrictive filling; 4 chamber enlargement; severe RV dysfunction; mild MR; moderate TR; moderate pulmonary hypertension; small pericardial effusion.  2. Left ventricular ejection fraction, by estimation, is 20 to 25%. The left ventricle has severely decreased function. The left ventricle demonstrates global hypokinesis. The left ventricular  internal cavity size was moderately dilated. There is mild left ventricular hypertrophy. Left ventricular diastolic parameters are consistent with Grade III diastolic dysfunction (restrictive).  3. Right ventricular systolic function is severely reduced. The right ventricular size is mildly enlarged. There is moderately  elevated pulmonary artery systolic pressure.  4. Left atrial size was mildly dilated.  5. Right atrial size was mildly dilated.  6. The mitral valve is normal in structure. Mild mitral valve regurgitation. No evidence of mitral stenosis.  7. Tricuspid valve regurgitation is moderate.  8. The aortic valve has an indeterminant number of cusps. Aortic valve regurgitation is not visualized. No aortic stenosis is present.  9. The inferior vena cava is dilated in size with <50% respiratory variability, suggesting right atrial pressure of 15 mmHg. FINDINGS  Left Ventricle: Left ventricular ejection fraction, by estimation, is 20 to 25%. The left ventricle has severely decreased function. The left ventricle demonstrates global hypokinesis. The left ventricular internal cavity size was moderately dilated. There is mild left ventricular hypertrophy. Left ventricular diastolic parameters are consistent with Grade III diastolic dysfunction (restrictive). Right Ventricle: The right ventricular size is mildly enlarged.Right ventricular systolic function is severely reduced. There is moderately elevated pulmonary artery systolic pressure. The tricuspid regurgitant velocity is 3.30 m/s, and with an assumed right atrial pressure of 15 mmHg, the estimated right ventricular systolic pressure is 53.6 mmHg. Left Atrium: Left atrial size was mildly dilated. Right Atrium: Right atrial size was mildly dilated. Pericardium: A small pericardial effusion is present. Mitral Valve: The mitral valve is normal in structure. Normal mobility of the mitral valve leaflets. Mild mitral valve regurgitation. No evidence of mitral valve  stenosis. Tricuspid Valve: The tricuspid valve is normal in structure. Tricuspid valve regurgitation is moderate . No evidence of tricuspid stenosis. Aortic Valve: The aortic valve has an indeterminant number of cusps. Aortic valve regurgitation is not visualized. No aortic stenosis is present. Aortic valve mean gradient measures 3.2 mmHg. Aortic valve peak gradient measures 6.6 mmHg. Aortic valve area, by VTI measures 2.71 cm. Pulmonic Valve: The pulmonic valve was not well visualized. Pulmonic valve regurgitation is trivial. No evidence of pulmonic stenosis. Aorta: The aortic root is normal in size and structure. Venous: The inferior vena cava is dilated in size with less than 50% respiratory variability, suggesting right atrial pressure of 15 mmHg. IAS/Shunts: No atrial level shunt detected by color flow Doppler. Additional Comments: Severe global reduction in LV systolic function; restrictive filling; 4 chamber enlargement; severe RV dysfunction; mild MR; moderate TR; moderate pulmonary hypertension; small pericardial effusion.  LEFT VENTRICLE PLAX 2D LVIDd:         6.16 cm LVIDs:         5.03 cm LV PW:         1.38 cm LV IVS:        1.53 cm LVOT diam:     2.30 cm LV SV:         55 LV SV Index:   22 LVOT Area:     4.15 cm  LV Volumes (MOD) LV vol d, MOD A2C: 318.0 ml LV vol d, MOD A4C: 215.0 ml LV vol s, MOD A2C: 232.0 ml LV vol s, MOD A4C: 177.0 ml LV SV MOD A2C:     86.0 ml LV SV MOD A4C:     215.0 ml LV SV MOD BP:      57.1 ml RIGHT VENTRICLE            IVC RV Basal diam:  4.02 cm    IVC diam: 3.59 cm RV Mid diam:    4.69 cm RV S prime:     6.31 cm/s TAPSE (M-mode): 0.8 cm LEFT ATRIUM  Index       RIGHT ATRIUM           Index LA diam:        5.20 cm 2.08 cm/m  RA Area:     23.90 cm LA Vol (A2C):   58.1 ml 23.20 ml/m RA Volume:   72.00 ml  28.75 ml/m LA Vol (A4C):   98.6 ml 39.37 ml/m LA Biplane Vol: 84.0 ml 33.54 ml/m  AORTIC VALVE AV Area (Vmax):    2.89 cm AV Area (Vmean):   2.79 cm AV  Area (VTI):     2.71 cm AV Vmax:           128.60 cm/s AV Vmean:          85.780 cm/s AV VTI:            0.202 m AV Peak Grad:      6.6 mmHg AV Mean Grad:      3.2 mmHg LVOT Vmax:         89.40 cm/s LVOT Vmean:        57.620 cm/s LVOT VTI:          0.132 m LVOT/AV VTI ratio: 0.65  AORTA Ao Root diam: 3.30 cm Ao Asc diam:  3.60 cm TRICUSPID VALVE TR Peak grad:   43.6 mmHg TR Vmax:        330.00 cm/s  SHUNTS Systemic VTI:  0.13 m Systemic Diam: 2.30 cm Kirk Ruths MD Electronically signed by Kirk Ruths MD Signature Date/Time: 10/15/2019/4:19:33 PM    Final

## 2019-10-19 DEATH — deceased

## 2019-10-20 ENCOUNTER — Inpatient Hospital Stay (HOSPITAL_COMMUNITY): Payer: BC Managed Care – PPO

## 2019-10-20 LAB — CORTISOL: Cortisol, Plasma: 7.7 ug/dL

## 2019-10-20 LAB — RENAL FUNCTION PANEL
Albumin: 2.6 g/dL — ABNORMAL LOW (ref 3.5–5.0)
Albumin: 2.6 g/dL — ABNORMAL LOW (ref 3.5–5.0)
Anion gap: 13 (ref 5–15)
Anion gap: 9 (ref 5–15)
BUN: 62 mg/dL — ABNORMAL HIGH (ref 6–20)
BUN: 53 mg/dL — ABNORMAL HIGH (ref 6–20)
CO2: 21 mmol/L — ABNORMAL LOW (ref 22–32)
CO2: 22 mmol/L (ref 22–32)
Calcium: 7.8 mg/dL — ABNORMAL LOW (ref 8.9–10.3)
Calcium: 7.7 mg/dL — ABNORMAL LOW (ref 8.9–10.3)
Chloride: 104 mmol/L (ref 98–111)
Chloride: 107 mmol/L (ref 98–111)
Creatinine, Ser: 3.22 mg/dL — ABNORMAL HIGH (ref 0.61–1.24)
Creatinine, Ser: 2.9 mg/dL — ABNORMAL HIGH (ref 0.61–1.24)
GFR calc Af Amer: 26 mL/min — ABNORMAL LOW (ref >60)
GFR calc Af Amer: 30 mL/min — ABNORMAL LOW (ref >60)
GFR calc non Af Amer: 22 mL/min — ABNORMAL LOW (ref >60)
GFR calc non Af Amer: 26 mL/min — ABNORMAL LOW (ref >60)
Glucose, Bld: 147 mg/dL — ABNORMAL HIGH (ref 70–99)
Glucose, Bld: 148 mg/dL — ABNORMAL HIGH (ref 70–99)
Phosphorus: 3.7 mg/dL (ref 2.5–4.6)
Phosphorus: 3.1 mg/dL (ref 2.5–4.6)
Potassium: 4.6 mmol/L (ref 3.5–5.1)
Potassium: 4.7 mmol/L (ref 3.5–5.1)
Sodium: 138 mmol/L (ref 135–145)
Sodium: 138 mmol/L (ref 135–145)

## 2019-10-20 LAB — GLUCOSE, CAPILLARY
Glucose-Capillary: 123 mg/dL — ABNORMAL HIGH (ref 70–99)
Glucose-Capillary: 156 mg/dL — ABNORMAL HIGH (ref 70–99)
Glucose-Capillary: 146 mg/dL — ABNORMAL HIGH (ref 70–99)
Glucose-Capillary: 143 mg/dL — ABNORMAL HIGH (ref 70–99)

## 2019-10-20 LAB — CBC
HCT: 30.5 % — ABNORMAL LOW (ref 39.0–52.0)
Hemoglobin: 9.7 g/dL — ABNORMAL LOW (ref 13.0–17.0)
MCH: 30 pg (ref 26.0–34.0)
MCHC: 31.8 g/dL (ref 30.0–36.0)
MCV: 94.4 fL (ref 80.0–100.0)
Platelets: 155 10*3/uL (ref 150–400)
RBC: 3.23 MIL/uL — ABNORMAL LOW (ref 4.22–5.81)
RDW: 16.1 % — ABNORMAL HIGH (ref 11.5–15.5)
WBC: 6.1 10*3/uL (ref 4.0–10.5)
nRBC: 1.1 % — ABNORMAL HIGH (ref 0.0–0.2)

## 2019-10-20 LAB — APTT
aPTT: 60 seconds — ABNORMAL HIGH (ref 24–36)
aPTT: 63 seconds — ABNORMAL HIGH (ref 24–36)
aPTT: 80 seconds — ABNORMAL HIGH (ref 24–36)

## 2019-10-20 LAB — BRAIN NATRIURETIC PEPTIDE: B Natriuretic Peptide: 1374.3 pg/mL — ABNORMAL HIGH (ref 0.0–100.0)

## 2019-10-20 LAB — PROCALCITONIN: Procalcitonin: 0.38 ng/mL

## 2019-10-20 LAB — MAGNESIUM: Magnesium: 2.4 mg/dL (ref 1.7–2.4)

## 2019-10-20 LAB — D-DIMER, QUANTITATIVE: D-Dimer, Quant: 10.15 ug/mL-FEU — ABNORMAL HIGH (ref 0.00–0.50)

## 2019-10-20 LAB — C-REACTIVE PROTEIN: CRP: 3.3 mg/dL — ABNORMAL HIGH (ref <1.0)

## 2019-10-20 LAB — HEPARIN LEVEL (UNFRACTIONATED): Heparin Unfractionated: 0.75 IU/mL — ABNORMAL HIGH (ref 0.30–0.70)

## 2019-10-20 MED ORDER — ESCITALOPRAM OXALATE 10 MG PO TABS
10.0000 mg | ORAL_TABLET | Freq: Every day | ORAL | Status: DC
  Administered 2019-10-20 – 2019-10-22 (×3): 10 mg via ORAL
  Filled 2019-10-20 (×6): qty 1

## 2019-10-20 MED ORDER — BENZONATATE 100 MG PO CAPS
200.0000 mg | ORAL_CAPSULE | Freq: Three times a day (TID) | ORAL | Status: DC | PRN
  Administered 2019-10-20 – 2019-10-23 (×6): 200 mg via ORAL
  Filled 2019-10-20 (×9): qty 2

## 2019-10-20 MED ORDER — GUAIFENESIN-DM 100-10 MG/5ML PO SYRP
10.0000 mL | ORAL_SOLUTION | Freq: Four times a day (QID) | ORAL | Status: DC | PRN
  Administered 2019-10-20 – 2019-10-23 (×9): 10 mL via ORAL
  Filled 2019-10-20 (×11): qty 10

## 2019-10-20 MED ORDER — METHYLPREDNISOLONE SODIUM SUCC 40 MG IJ SOLR
20.0000 mg | Freq: Every day | INTRAMUSCULAR | Status: AC
  Administered 2019-10-20: 20 mg via INTRAVENOUS
  Filled 2019-10-20: qty 1

## 2019-10-20 NOTE — Progress Notes (Signed)
PROGRESS NOTE                                                                                                                                                                                                             Patient Demographics:    Joshua Massey, is a 44 y.o. male, DOB - 1975-08-05, ZGY:[id number]  Outpatient Primary MD for the patient is [name], [name], MD    LOS - 5  Admit date - 09/23/2019    Chief Complaint  Patient presents with  . Leg Swelling  . Abdominal Pain       Brief Narrative  - Joshua Massey is a 44 y.o. male with medical history significant of IDDM, HTN, and chronic systolic heart failure EF 20-25%,CKD stage IV, morbid obesity, poor compliance with medications, presented with increasing redness and increasing short of breath for 3 weeks, he ran out of his diuretics few weeks ago.  In the ER he was diagnosed with acute on chronic combined systolic diastolic heart failure with massive fluid overload, AKI on CKD 4, incidental Covid infection and admitted to the hospital.   Subjective:   Patient in bed, appears comfortable, denies any headache, no fever, no chest pain or pressure, no shortness of breath , no abdominal pain. No focal weakness.   Assessment  & Plan :      1. Acute Hypoxic Resp. Failure due to Severe acute on chronic combined systolic and diastolic heart failure with EF of 25%, Severe right ventricular systolic dysfunction along with AKI on CKD 5 with anasarca.  He was seen by cardiology and nephrology, despite aggressive diuretics his urine output was minimal and finally on 10/18/2019 he was transferred to ICU, right IJ dialysis catheter was placed by critical care and CRRT was started by the nephrology team on 10/18/19.  He is a poor candidate for long-term HD due to biventricular heart failure. Continue combination of amiodarone, Coreg, BiDil and diuretics.  Renal function precludes  the use of ACE/ARB or Entresto.   Acute Covid 19 Viral Pneumonitis during the ongoing 2020 Covid 19 Pandemic - this seems most likely to be incidental finding will monitor, since he is hospitalized finished remdesivir course along with short course of steroids which will and today (avoid further fluid retention). Encouraged the patient to sit up in chair in the daytime  use I-S and flutter valve for pulmonary toiletry and then prone in bed when at night.  Will advance activity and titrate down oxygen as possible. Low grade fever 10/18/19 with mild rise in Procalcitonin - could be from Covid, check 2 set  and monitor, appears non toxic.  So far cultures negative and appears stable.  Will monitor.   Recent Labs  Lab 10/15/19 1107 10/15/19 1201 10/15/19 1702 10/16/19 0406 10/16/19 0733 10/17/19 0413 10/18/19 0222 10/20/19 0500  CRP  --   --  5.1* 5.2*  --  5.7* 5.6*  --   DDIMER  --   --  15.09* 13.60*  --  13.94* 13.49* 10.15*  BNP 1,305.3*  --   --   --   --  1,504.6* 2,531.6* 1,374.3*  PROCALCITON  --   --  0.61  --  0.68 0.75 1.08  --   SARSCOV2NAA  --  POSITIVE*  --   --   --   --   --   --      3.  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  No signs of osteomyelitis on MRI, no signs of cellulitis, seen by orthopedic surgeon Dr. Sharol Given who recommends medical treatment, currently no signs of active infection, continue wound care with outpatient follow-up with Dr. Sharol Given post discharge.  4.  AKI on CKD 4 with anasarca.  Baseline creatinine close to 4.7.  Was oliguric despite high-dose Lasix now started on CRRT by nephrology on 10/18/2019, PCCM assisted with right IJ catheter placement, continue fluid removal.   5.  Dyslipidemia.  Continue statin.  6.  Essential hypertension.  Continue home dose Coreg and BiDil combination along with diuretics for now.  Monitor and adjust.   7.  Paroxysmal atrial fibrillation [place] vas 2 score of greater than 3.  For now on amiodarone and beta-blocker for  rate control, heparin drip.  Will monitor.  Cardiology on board. Stable TSH.  8.  Anemia of chronic disease.  No signs of active bleeding, anemia panel, oral iron supplement, PPI and monitor.  9. ++ High D-dimer.  Continue on heparin drip for now which he is on for paroxysmal A. Fib.  Not doing any imaging for DVT as management will not change he is already on full anticoagulation and needs to be on it for his high [place] vas 2 score.  10 Flat affect, he appears depressed but not suicidal homicidal, trial of Lexapro and monitor.    Condition - Fair  Family Communication  :    Mother [phone] on 10/17/19 at 9:35 AM.  Message left. [name] [phone] on 10/17/2019 updated, 10/20/19 at 8:51 AM, over 15 rings no response.  Code Status :  Full  Diet :   Diet Order            Diet renal with fluid restriction Fluid restriction: 1200 mL Fluid; Room service appropriate? Yes; Fluid consistency: Thin  Diet effective now               Disposition Plan  : Stay in the inpatient hospital in ICU for severe acute on chronic combined systolic and diastolic CHF, AKI on CKD 4, COVID-19 infection.  Thereafter discharge home once medically stable. CRRT initiated as well.  Status is: Inpatient  Remains inpatient appropriate because:IV treatments appropriate due to intensity of illness or inability to take PO   Dispo: The patient is from: Home              Anticipated  d/c is to: Home              Anticipated d/c date is: > 3 days              Patient currently is not medically stable to d/c.   Consults  :  Cards, Ortho, Renal, PCCM  Procedures  :    R.IJ HD Catheter - PCCM 10/18/19  MRI left foot.  No osteomyelitis. Charcot joints.  TTE.  1. Severe global reduction in LV systolic function; restrictive filling; 4 chamber enlargement; severe RV dysfunction; mild MR; moderate TR; moderate pulmonary hypertension; small pericardial effusion. 2. Left ventricular ejection fraction, by  estimation, is 20 to 25%. The left ventricle has severely decreased function. The left ventricle demonstrates global hypokinesis. The left ventricular internal cavity size was moderately dilated. There is mild left ventricular hypertrophy. Left ventricular diastolic parameters are consistent with Grade III diastolic dysfunction (restrictive).  3. Right ventricular systolic function is severely reduced. The right ventricular size is mildly enlarged. There is moderately elevated pulmonary artery systolic pressure.  4. Left atrial size was mildly dilated.  5. Right atrial size was mildly dilated.  6. The mitral valve is normal in structure. Mild mitral valve regurgitation. No evidence of mitral stenosis.  7. Tricuspid valve regurgitation is moderate.  8. The aortic valve has an indeterminant number of cusps. Aortic valve regurgitation is not visualized. No aortic stenosis is present.  9. The inferior vena cava is dilated in size with <50% respiratory variability, suggesting right atrial pressure of 15 mmHg.  PUD Prophylaxis :   DVT Prophylaxis  :   Heparin  Lab Results  Component Value Date   PLT 155 10/20/2019    Inpatient Medications  Scheduled Meds: . amiodarone  200 mg Oral Daily  . atorvastatin  10 mg Oral Q supper  . carvedilol  25 mg Oral BID WC  . Chlorhexidine Gluconate Cloth  6 each Topical Daily  . darbepoetin (ARANESP) injection - NON-DIALYSIS  40 mcg Subcutaneous Once  . ferrous sulfate  325 mg Oral BID WC  . insulin aspart  0-15 Units Subcutaneous TID WC  . insulin aspart  0-5 Units Subcutaneous QHS  . isosorbide-hydrALAZINE  1 tablet Oral BID  . methylPREDNISolone (SOLU-MEDROL) injection  40 mg Intravenous Daily  . multivitamin with minerals  1 tablet Oral Daily  . pantoprazole  40 mg Oral Daily  . sodium chloride flush  3 mL Intravenous Q12H   Continuous Infusions: .  prismasol BGK 4/2.5 400 mL/hr at 10/20/19 0649  .  prismasol BGK 4/2.5 300 mL/hr at 10/20/19  0257  . sodium chloride 10 mL/hr at 10/20/19 0800  . heparin 900 Units/hr (10/20/19 0800)  . prismasol BGK 4/2.5 1,800 mL/hr at 10/20/19 0535  . remdesivir 100 mg in NS 100 mL Stopped (10/19/19 1052)   PRN Meds:.sodium chloride, acetaminophen, gabapentin, heparin, LORazepam, ondansetron (ZOFRAN) IV  Antibiotics  :    Anti-infectives (From admission, onward)   Start     Dose/Rate Route Frequency Ordered Stop   10/18/19 1000  remdesivir 100 mg in sodium chloride 0.9 % 100 mL IVPB     100 mg 200 mL/hr over 30 Minutes Intravenous Daily 10/17/19 0707 10/22/19 0959   10/17/19 0800  remdesivir 200 mg in sodium chloride 0.9% 250 mL IVPB     200 mg 580 mL/hr over 30 Minutes Intravenous Once 10/17/19 0707 10/17/19 1906       Time Spent in minutes  30   Anistyn Graddy  Candiss Norse M.D on 10/20/2019 at 8:44 AM  To page go to [email or web address] - password [facility]  [facility] -  Office  [phone]   See all Orders from today for further details    Objective:   Vitals:   10/20/19 0525 10/20/19 0600 10/20/19 0700 10/20/19 0800  BP:  (!) 136/92 (!) 126/93 134/85  Pulse:  87 77 69  Resp:  18 17 15   Temp:    (!) 96.5 F (35.8 C)  TempSrc:    Axillary  SpO2: 90% 96% 95% 93%  Weight:      Height:        Wt Readings from Last 3 Encounters:  10/20/19 (!) 165.8 kg  06/04/19 (!) 136.4 kg  12/10/18 (!) 142.9 kg     Intake/Output Summary (Last 24 hours) at 10/20/2019 0844 Last data filed at 10/20/2019 0800 Gross per 24 hour  Intake 1030.11 ml  Output 4638 ml  Net -3607.89 ml     Physical Exam  Awake Alert, No new F.N deficits, Normal affect [place].AT,PERRAL Supple Neck,No JVD, No cervical lymphadenopathy appriciated.  Symmetrical Chest wall movement, Good air movement bilaterally, CTAB RRR,No Gallops, Rubs or new Murmurs, No Parasternal Heave +ve B.Sounds, Abd Soft, No tenderness, No organomegaly appriciated, No rebound - guarding or rigidity.  3 + edema in the scrotum and lower extremities,  R.IJ HD Catheter, UNNA Boot L foot and leg   Data Review:    CBC Recent Labs  Lab 10/01/2019 2206 10/16/19 0406 10/17/19 0413 10/18/19 0222 10/20/19 0500  WBC 4.5 4.5 4.0 4.2 6.1  HGB 10.2* 9.7* 9.4* 9.2* 9.7*  HCT 33.0* 31.4* 31.0* 30.1* 30.5*  PLT 175 152 146* 153 155  MCV 98.5 97.8 98.4 98.0 94.4  MCH 30.4 30.2 29.8 30.0 30.0  MCHC 30.9 30.9 30.3 30.6 31.8  RDW 16.4* 16.5* 16.8* 16.6* 16.1*    Chemistries  Recent Labs  Lab 10/06/2019 2206 10/03/2019 2206 10/15/19 1702 10/16/19 0406 10/16/19 0406 10/17/19 0413 10/17/19 0413 10/18/19 0222 10/18/19 0222 10/18/19 1540 10/18/19 2134 10/18/19 2200 10/19/19 0713 10/19/19 1100 10/19/19 1527 10/20/19 0500  NA 141   < >  --  141   < > 142   < > 141   < > 141  --  140  --  142 139 138  K 4.6   < >  --  4.7   < > 4.7   < > 4.9   < > 5.2*  --  5.1  --  4.8 5.0 4.6  CL 108   < >  --  108   < > 108   < > 110   < > 107  --  109  --  108 106 104  CO2 19*   < >  --  18*   < > 21*   < > 25   < > 20*  --  18*  --  21* 21* 21*  GLUCOSE 103*   < >  --  118*   < > 107*   < > 110*   < > 128*  --  130*  --  114* 122* 147*  BUN 95*   < >  --  100*   < > 104*   < > 111*   < > 117*  --  102*  --  85* 79* 62*  CREATININE 4.81*   < >  --  5.13*   < > 5.48*   < > 5.63*   < >  5.74*  --  4.98*  --  4.09* 3.98* 3.22*  CALCIUM 8.3*   < >  --  8.1*   < > 7.9*   < > 7.9*   < > 7.9*  --  7.8*  --  7.9* 7.7* 7.8*  AST 37  --   --   --   --   --   --   --   --   --   --  31  --   --   --   --   ALT 17  --   --   --   --   --   --   --   --   --   --  13  --   --   --   --   ALKPHOS 90  --   --   --   --   --   --   --   --   --   --  87  --   --   --   --   BILITOT 1.3*  --   --   --   --   --   --   --   --   --   --  1.5*  --   --   --   --   MG  --   --   --  2.0  --  2.1  --  2.2  --   --   --   --   --  2.4  --   --   AMMONIA  --   --   --   --   --   --   --   --   --   --  53*  --   --   --   --   --   TSH  --   --   --   --   --   --   --   --    --   --   --   --  2.828  --   --   --   HGBA1C  --   --  6.3*  --   --   --   --   --   --   --   --   --   --   --   --   --    < > = values in this interval not displayed.     ------------------------------------------------------------------------------------------------------------------ No results for input(s): CHOL, HDL, LDLCALC, TRIG, CHOLHDL, LDLDIRECT in the last 72 hours.  Lab Results  Component Value Date   HGBA1C 6.3 (H) 10/15/2019   ------------------------------------------------------------------------------------------------------------------ Recent Labs    10/19/19 0713  TSH 2.828    Cardiac Enzymes No results for input(s): CKMB, TROPONINI, MYOGLOBIN in the last 168 hours.  Invalid input(s): CK ------------------------------------------------------------------------------------------------------------------    Component Value Date/Time   BNP 1,374.3 (H) 10/20/2019 0500   BNP 15.0 01/01/2013 1212    Micro Results Recent Results (from the past 240 hour(s))  Respiratory Panel by RT PCR (Flu A&B, Covid) - Nasopharyngeal Swab     Status: Abnormal   Collection Time: 10/15/19 12:01 PM   Specimen: Nasopharyngeal Swab  Result Value Ref Range Status   SARS Coronavirus 2 by RT PCR POSITIVE (A) NEGATIVE Final    Comment: RESULT CALLED TO, READ BACK BY AND VERIFIED WITH: Benard Halsted RN 14:30 10/15/19 ([email or web address]) (NOTE) SARS-CoV-2 target nucleic acids  are DETECTED. SARS-CoV-2 RNA is generally detectable in upper respiratory specimens  during the acute phase of infection. Positive results are indicative of the presence of the identified virus, but do not rule out bacterial infection or co-infection with other pathogens not detected by the test. Clinical correlation with patient history and other diagnostic information is necessary to determine patient infection status. The expected result is Negative. Fact Sheet for Patients:   PinkCheek.be Fact Sheet for Healthcare Providers: GravelBags.it This test is not yet approved or cleared by the [place] FDA and  has been authorized for detection and/or diagnosis of SARS-CoV-2 by FDA under an Emergency Use Authorization (EUA).  This EUA will remain in effect (meaning this test can be used) f or the duration of  the COVID-19 declaration under Section 564(b)(1) of the Act, 21 U.S.C. section 360bbb-3(b)(1), unless the authorization is terminated or revoked sooner.    Influenza A by PCR NEGATIVE NEGATIVE Final   Influenza B by PCR NEGATIVE NEGATIVE Final    Comment: (NOTE) The Xpert Xpress SARS-CoV-2/FLU/RSV assay is intended as an aid in  the diagnosis of influenza from Nasopharyngeal swab specimens and  should not be used as a sole basis for treatment. Nasal washings and  aspirates are unacceptable for Xpert Xpress SARS-CoV-2/FLU/RSV  testing. Fact Sheet for Patients: PinkCheek.be Fact Sheet for Healthcare Providers: GravelBags.it This test is not yet approved or cleared by the [place] FDA and  has been authorized for detection and/or diagnosis of SARS-CoV-2 by  FDA under an Emergency Use Authorization (EUA). This EUA will remain  in effect (meaning this test can be used) for the duration of the  Covid-19 declaration under Section 564(b)(1) of the Act, 21  U.S.C. section 360bbb-3(b)(1), unless the authorization is  terminated or revoked. Performed at [facility], [place] [address] [place], [place] [address]   MRSA PCR Screening     Status: None   Collection Time: 10/16/19  3:12 PM   Specimen: Nasal Mucosa; Nasopharyngeal  Result Value Ref Range Status   MRSA by PCR NEGATIVE NEGATIVE Final    Comment:        The GeneXpert MRSA Assay (FDA approved for NASAL specimens only), is one component of a comprehensive MRSA  colonization surveillance program. It is not intended to diagnose MRSA infection nor to guide or monitor treatment for MRSA infections. Performed at [facility], [place] [address], [place], [place] [address]   Culture, blood (routine x 2)     Status: None (Preliminary result)   Collection Time: 10/18/19 10:21 AM   Specimen: BLOOD RIGHT HAND  Result Value Ref Range Status   Specimen Description BLOOD RIGHT HAND  Final   Special Requests   Final    BOTTLES DRAWN AEROBIC AND ANAEROBIC Blood Culture adequate volume   Culture   Final    NO GROWTH 2 DAYS Performed at [facility], [place] [address], [place], [place] [address]    Report Status PENDING  Incomplete  Culture, blood (routine x 2)     Status: None (Preliminary result)   Collection Time: 10/18/19 10:22 AM   Specimen: BLOOD RIGHT FOREARM  Result Value Ref Range Status   Specimen Description BLOOD RIGHT FOREARM  Final   Special Requests   Final    BOTTLES DRAWN AEROBIC AND ANAEROBIC Blood Culture adequate volume   Culture   Final    NO GROWTH 2 DAYS Performed at [facility], [place],  [place] [address]    Report Status PENDING  Incomplete    Radiology Reports DG Abd 1 View  Result Date: 10/20/2019 CLINICAL DATA:  Abdominal distension EXAM: ABDOMEN - 1 VIEW COMPARISON:  None. FINDINGS: Examination limited by patient body habitus. No visible abnormality. Postsurgical changes of both femoral necks. IMPRESSION: Limited examination due to patient body habitus. No visible acute abnormality. Electronically Signed   By: Ulyses Jarred M.D.   On: 10/20/2019 04:53   MR FOOT LEFT WO CONTRAST  Result Date: 10/15/2019 CLINICAL DATA:  Bilateral lower extremity swelling, question of osteomyelitis ulcer on the bottom of the left forefoot EXAM: MRI OF THE LEFT FOOT WITHOUT CONTRAST TECHNIQUE: Multiplanar, multisequence MR imaging of the left was performed. No intravenous contrast was administered. COMPARISON:   Multiple radiographs, most recent radiograph same day FINDINGS: Bones/Joint/Cartilage Again noted are chronic erosive type changes seen at the base of the first MTP joint there is stable dorsal on lateral subluxation of the metatarsals at the TMT joint joints with erosive type changes and chronic disruption of the Lisfranc ligaments. There is small erosive type changes seen throughout the cuneiform. There is no acute marrow signal abnormality seen throughout the osseous structures however. No large joint effusions are present. Probable healed fracture deformity of the fifth metatarsal base seen. There is diffuse chronic periosteal reaction seen through the second through fourth metatarsal shafts. Ligaments Chronic disruptions of the Lisfranc ligaments as described above. Muscles and Tendons Diffuse fatty atrophy with denervation seen throughout the forefoot. The flexor and extensor tendons appear to be grossly intact, however there is surrounding fluid signal on surrounding the flexor digitorum tendons. Soft tissues On the medial plantar forefoot there is areas of superficial ulceration with diffuse skin thickening and subcutaneous edema seen. No loculated fluid collections or sinus tract however are noted. There is diffuse dorsal subcutaneous edema seen on the dorsum of the forefoot. IMPRESSION: 1. 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 No definite evidence acute osteomyelitis. 2. Area superficial ulceration on the plantar base beneath the first metatarsal. No definite loculated fluid collection or sinus tract. 3. Diffuse subcutaneous edema and cellulitis surrounding the forefoot. Electronically Signed   By: Prudencio Pair M.D.   On: 10/15/2019 22:27   DG Chest Port 1 View  Result Date: 10/18/2019 CLINICAL DATA:  Central line placement. EXAM: PORTABLE CHEST 1 VIEW COMPARISON:  Same day. FINDINGS: Stable cardiomegaly. Stable bilateral lung  opacities are noted consistent with multifocal pneumonia. Hypoinflation of the lungs is noted. No pneumothorax or pleural effusion is noted. Right internal jugular catheter is noted with tip in expected position of right atrium. Bony thorax is unremarkable. IMPRESSION: Stable bilateral lung opacities are noted consistent with multifocal pneumonia. Hypoinflation of the lungs is noted. Right internal jugular catheter is noted with tip in expected position of right atrium. Electronically Signed   By: Marijo Conception M.D.   On: 10/18/2019 15:17   DG Chest Port 1 View  Result Date: 10/18/2019 CLINICAL DATA:  Shortness of breath. EXAM: PORTABLE CHEST 1 VIEW COMPARISON:  10/15/2019. FINDINGS: Severe cardiomegaly. Diffuse bilateral pulmonary infiltrates/edema. No pleural effusion or pneumothorax. No acute bony abnormality. Identified. IMPRESSION: Severe cardiomegaly.  Diffuse bilateral pulmonary infiltrates/edema. Electronically Signed   By: Marcello Moores  Register   On: 10/18/2019 08:35   DG Chest Port 1 View  Result Date: 10/15/2019 CLINICAL DATA:  Lower extremity edema EXAM: PORTABLE CHEST 1 VIEW COMPARISON:  05/22/2019 FINDINGS: Very low lung  volumes limiting evaluation. Pulmonary vascular congestion with possible mild perihilar edema. No significant pleural effusion. No pneumothorax. Stable cardiomegaly. IMPRESSION: Pulmonary vascular congestion with possible mild edema. Electronically Signed   By: Macy Mis M.D.   On: 10/15/2019 09:11   DG Foot Complete Left  Result Date: 10/15/2019 CLINICAL DATA:  Nonhealing wound to left foot. EXAM: LEFT FOOT - COMPLETE 3+ VIEW COMPARISON:  August 29, 2018. FINDINGS: Vascular calcifications are noted. Continued chronic dislocations degenerative changes seen involving the tarsometatarsal joints. Stable defect of middle portion of second middle phalanx is noted. Diffuse soft tissue swelling is noted. No acute lytic destruction is seen to suggest osteomyelitis. IMPRESSION:  Stable chronic findings as described above. No acute lytic destruction is seen to suggest osteomyelitis. Diffuse soft tissue swelling is noted. Electronically Signed   By: Marijo Conception M.D.   On: 10/15/2019 09:30   ECHOCARDIOGRAM COMPLETE  Result Date: 10/15/2019    ECHOCARDIOGRAM REPORT   Patient Name:   CLAVIN RUHLMAN Date of Exam: 10/15/2019 Medical Rec #:  [id number]        Height:       71.0 in Accession #:    [id number]       Weight:       299.8 lb Date of Birth:  13-Nov-1975        BSA:          2.504 m Patient Age:    25 years         BP:           144/99 mmHg Patient Gender: M                HR:           92 bpm. Exam Location:  Inpatient Procedure: 2D Echo, Cardiac Doppler and Color Doppler Indications:    CHF-Acute Systolic 025.42/H06.23  History:        Patient has prior history of Echocardiogram examinations. CHF,                 CAD; Risk Factors:Dyslipidemia. Non-ischemi cardiomyopathy. CKD.  Sonographer:    Clayton Lefort RDCS (AE) Referring Phys: [id number] Lequita Halt  Sonographer Comments: Patient is morbidly obese. IMPRESSIONS  1. Severe global reduction in LV systolic function; restrictive filling; 4 chamber enlargement; severe RV dysfunction; mild MR; moderate TR; moderate pulmonary hypertension; small pericardial effusion.  2. Left ventricular ejection fraction, by estimation, is 20 to 25%. The left ventricle has severely decreased function. The left ventricle demonstrates global hypokinesis. The left ventricular internal cavity size was moderately dilated. There is mild left ventricular hypertrophy. Left ventricular diastolic parameters are consistent with Grade III diastolic dysfunction (restrictive).  3. Right ventricular systolic function is severely reduced. The right ventricular size is mildly enlarged. There is moderately elevated pulmonary artery systolic pressure.  4. Left atrial size was mildly dilated.  5. Right atrial size was mildly dilated.  6. The mitral valve is normal in  structure. Mild mitral valve regurgitation. No evidence of mitral stenosis.  7. Tricuspid valve regurgitation is moderate.  8. The aortic valve has an indeterminant number of cusps. Aortic valve regurgitation is not visualized. No aortic stenosis is present.  9. The inferior vena cava is dilated in size with <50% respiratory variability, suggesting right atrial pressure of 15 mmHg. FINDINGS  Left Ventricle: Left ventricular ejection fraction, by estimation, is 20 to 25%. The left ventricle has severely decreased function. The left ventricle demonstrates global hypokinesis. The left ventricular  internal cavity size was moderately dilated. There is mild left ventricular hypertrophy. Left ventricular diastolic parameters are consistent with Grade III diastolic dysfunction (restrictive). Right Ventricle: The right ventricular size is mildly enlarged.Right ventricular systolic function is severely reduced. There is moderately elevated pulmonary artery systolic pressure. The tricuspid regurgitant velocity is 3.30 m/s, and with an assumed right atrial pressure of 15 mmHg, the estimated right ventricular systolic pressure is 41.6 mmHg. Left Atrium: Left atrial size was mildly dilated. Right Atrium: Right atrial size was mildly dilated. Pericardium: A small pericardial effusion is present. Mitral Valve: The mitral valve is normal in structure. Normal mobility of the mitral valve leaflets. Mild mitral valve regurgitation. No evidence of mitral valve stenosis. Tricuspid Valve: The tricuspid valve is normal in structure. Tricuspid valve regurgitation is moderate . No evidence of tricuspid stenosis. Aortic Valve: The aortic valve has an indeterminant number of cusps. Aortic valve regurgitation is not visualized. No aortic stenosis is present. Aortic valve mean gradient measures 3.2 mmHg. Aortic valve peak gradient measures 6.6 mmHg. Aortic valve area, by VTI measures 2.71 cm. Pulmonic Valve: The pulmonic valve was not well  visualized. Pulmonic valve regurgitation is trivial. No evidence of pulmonic stenosis. Aorta: The aortic root is normal in size and structure. Venous: The inferior vena cava is dilated in size with less than 50% respiratory variability, suggesting right atrial pressure of 15 mmHg. IAS/Shunts: No atrial level shunt detected by color flow Doppler. Additional Comments: Severe global reduction in LV systolic function; restrictive filling; 4 chamber enlargement; severe RV dysfunction; mild MR; moderate TR; moderate pulmonary hypertension; small pericardial effusion.  LEFT VENTRICLE PLAX 2D LVIDd:         6.16 cm LVIDs:         5.03 cm LV PW:         1.38 cm LV IVS:        1.53 cm LVOT diam:     2.30 cm LV SV:         55 LV SV Index:   22 LVOT Area:     4.15 cm  LV Volumes (MOD) LV vol d, MOD A2C: 318.0 ml LV vol d, MOD A4C: 215.0 ml LV vol s, MOD A2C: 232.0 ml LV vol s, MOD A4C: 177.0 ml LV SV MOD A2C:     86.0 ml LV SV MOD A4C:     215.0 ml LV SV MOD BP:      57.1 ml RIGHT VENTRICLE            IVC RV Basal diam:  4.02 cm    IVC diam: 3.59 cm RV Mid diam:    4.69 cm RV S prime:     6.31 cm/s TAPSE (M-mode): 0.8 cm LEFT ATRIUM             Index       RIGHT ATRIUM           Index LA diam:        5.20 cm 2.08 cm/m  RA Area:     23.90 cm LA Vol (A2C):   58.1 ml 23.20 ml/m RA Volume:   72.00 ml  28.75 ml/m LA Vol (A4C):   98.6 ml 39.37 ml/m LA Biplane Vol: 84.0 ml 33.54 ml/m  AORTIC VALVE AV Area (Vmax):    2.89 cm AV Area (Vmean):   2.79 cm AV Area (VTI):     2.71 cm AV Vmax:           128.60 cm/s  AV Vmean:          85.780 cm/s AV VTI:            0.202 m AV Peak Grad:      6.6 mmHg AV Mean Grad:      3.2 mmHg LVOT Vmax:         89.40 cm/s LVOT Vmean:        57.620 cm/s LVOT VTI:          0.132 m LVOT/AV VTI ratio: 0.65  AORTA Ao Root diam: 3.30 cm Ao Asc diam:  3.60 cm TRICUSPID VALVE TR Peak grad:   43.6 mmHg TR Vmax:        330.00 cm/s  SHUNTS Systemic VTI:  0.13 m Systemic Diam: 2.30 cm Kirk Ruths MD  Electronically signed by Kirk Ruths MD Signature Date/Time: 10/15/2019/4:19:33 PM    Final

## 2019-10-20 NOTE — Progress Notes (Signed)
[place] for IV Heparin Indication: atrial fibrillation  No Known Allergies  Patient Measurements: Height: 5\' 11"  (180.3 cm) Weight: (!) 165.8 kg (365 lb 8.4 oz) IBW/kg (Calculated) : 75.3 Heparin Dosing Weight: 106 kg  Vital Signs: Temp: 97.3 F (36.3 C) (05/02 1600) Temp Source: Axillary (05/02 1600) BP: 129/82 (05/02 1600) Pulse Rate: 90 (05/02 1600)  Labs: Recent Labs    10/18/19 0222 10/18/19 1346 10/18/19 2200 10/19/19 0713 10/19/19 1100 10/19/19 1527 10/20/19 0500 10/20/19 1541  HGB 9.2*  --   --   --   --   --  9.7*  --   HCT 30.1*  --   --   --   --   --  30.5*  --   PLT 153  --   --   --   --   --  155  --   APTT 126*   < >  --  85*  --   --  60* 63*  HEPARINUNFRC 1.98*  --   --  1.22*  --   --  0.75*  --   CREATININE 5.63*   < >   < >  --  4.09* 3.98* 3.22*  --    < > = values in this interval not displayed.    Estimated Creatinine Clearance: 47.1 mL/min (A) (by C-G formula based on SCr of 3.22 mg/dL (H)).   Assessment: 44 yr old male presented with severe fluid overload and was found to have COVID. He is on apixaban PTA for hx a fib (last dose on 4/27@1407 ). Scr continues to increase from 4.81 to 5.48 (pt refusing HD). Pharmacy was consulted to transition pt from apixaban to IV heparin.  Given recent apixaban dosing, will monitor both aPTT and heparin level until correlated.   PTT remains slightly subtherapeutic this AM (60 sec) on gtt at 1100 units/hr. RN reports no s/s of overt bleeding  Goal of Therapy:  Heparin level 0.3-0.7 units/ml aPTT 66-102 seconds Monitor platelets by anticoagulation protocol: Yes   Plan:  Increase heparin infusion to 1300 units/hr APTT in 6 hours.  Daily HL, APTT, CBC  Albertina Parr, PharmD., BCPS, BCCCP Clinical Pharmacist Clinical phone for 10/20/19 until 10pm: [phone] If after 10pm, please refer to [facility] for unit-specific pharmacist

## 2019-10-20 NOTE — Progress Notes (Signed)
Notified E-link that after replacing foley catheter 36ml of clear, yellow urine came out but bladder scan volume after placement was 935ml.

## 2019-10-20 NOTE — Progress Notes (Signed)
[facility] Progress Note  Name: Joshua Massey MRN: [id number] DOB: [birth year]  Chief Complaint:  Presented with shortness of breath and swelling and found to have covid   Subjective:   Seen and examined on CRRT.  Procedure supervised.  he had 4.4 liters UF over 5/1 with CRRT.  Has been net neg 100 to 150 ml/hr.  He had 221 mL UOP over 5/1.  Had a bladder scan which had concern for > 999 mL urine with under 10 on cath and distended abdomen; pulm overnight ordered an abd ultrasound.  Note has a foley.  On 4 liters oxygen  Review of systems:  Denies overt shortness of breath to me - per nurse has had some shortness of breath and desats when wean oxygen much.   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 Atrial fibrillation on Eliquis, who presented with progressive increase in lower ext edema and redness, abdominal girth, DOE, orthopnea, and weight gain after he ran out of his torsemide. He was also noted to be covid-19 +.  He was admitted with acute on chronic CHF and we were consulted to further evaluate and manage his AKI/CKD stage 4-5 presumably due to cardiorenal syndrome. The trend in Scr is seen below.  Of note, he has had multiple admissions with a similar presentation, most recently on 05/22/19 at that time he declined to have vascular access placed in preparation for dialysis despite his advanced CKD.  He also failed to follow up in our office or with the heart failure clinic.  When speaking with him today about his worsening renal function and lack of response to a lasix drip, and likely need for dialysis he declined proceeding with HD catheter placement today.  He is not sure he would go on  dialysis and understands the consequences.  However when asked if he was ok with dying of kidney failure he reluctantly stated, "not really".     Intake/Output Summary (Last 24 hours) at 10/20/2019 0737 Last data filed at 10/20/2019 0700 Gross per 24 hour  Intake 1020.11 ml  Output 4618 ml  Net -3597.89 ml    Vitals:  Vitals:   10/20/19 0500 10/20/19 0525 10/20/19 0600 10/20/19 0700  BP: (!) 127/97  (!) 136/92 (!) 126/93  Pulse: 85  87 77  Resp: 17  18 17   Temp:      TempSrc:      SpO2: 93% 90% 96% 95%  Weight: (!) 165.8 kg     Height:         Physical Exam:  General adult male in bed on CRRT HEENT normocephalic atraumatic extraocular movements intact sclera anicteric Neck increased circumference trachea midline Lungs coarse and reduced breath sounds; on suppl oxygen 6 liters Heart S1S2 no rub Abdomen obese habitus anasarca with body wall edema Extremities anasarca with 3+ edema; softer  Neuro - alert and oriented; provides hx and follows commands Access RIJ nontunneled catheter    Medications reviewed   Labs:  BMP Latest Ref Rng & Units 10/19/2019 10/19/2019 10/18/2019  Glucose 70 - 99 mg/dL 122(H) 114(H) 130(H)  BUN 6 - 20 mg/dL 79(H) 85(H) 102(H)  Creatinine 0.61 - 1.24 mg/dL 3.98(H) 4.09(H) 4.98(H)  Sodium 135 - 145 mmol/L 139 142  140  Potassium 3.5 - 5.1 mmol/L 5.0 4.8 5.1  Chloride 98 - 111 mmol/L 106 108 109  CO2 22 - 32 mmol/L 21(L) 21(L) 18(L)  Calcium 8.9 - 10.3 mg/dL 7.7(L) 7.9(L) 7.8(L)     Assessment/Plan:   Assessment/Plan: 1. Oliguric AKI/CKD stage 4-5- in setting of acute on chronic combined diastolic and systolic CHF as well as [id number] pneumonia.  He has had several episodes over the past year similar to this and usually due to noncompliance with his diuretics and follow up.  He was started on a lasix drip on 10/15/19 without much improvement despite multiple doses of metolazone.  Started CRRT on 4/30 after nontunneled catheter with critical care   1. Continue CRRT; 4K fluids and goal UF of net negative 150 an hour as tolerated.   2. Do not feel that he would tolerate intermittent HD right now and may ultimately poorly tolerate given his combined diastolic and systolic CHF.  Attempt transition once volume status optimized with CRRT  2. Acute on chronic combined diastolic and systolic CHF- due to noncompliance with diuretics.  Significant anasarca.  Echo with EF 53-74% EF, RV systolic function severely reduced and grade III DD (restrictive).  Plan as outlined above. 3. Acute hypoxic respiratory failure due to #2 and covid-19 Pneumonia.  Optimize volume as above 4. covid 19 PNA - therapies per primary team  5. Hyperphosphatemia - improving with CRRT; watch for need for repletion soon 6. Metabolic acidosis - on CRRT 7. Left charcot join with plantar ulcer- followed by Dr. Sharol Given 8. P. Atrial fib- on amio per Cardiology. 9. Anemia of CKD - no acute indication for PRBC's .  aranesp 40 mcg once on 4/30   10. Medical noncompliance- ongoing issue, poor health literacy and insight. 11. Disposition- poor overall prognosis.  Not certain he would tolerate intermittent HD given biventricular heart failure and low bp's.      Do also recommend palliative care consult given above  Claudia Desanctis, MD 10/20/2019 8:00 AM

## 2019-10-20 NOTE — Progress Notes (Signed)
eLink Physician-Brief Progress Note Patient Name: Joshua Massey DOB: 04/22/1976 MRN: [id number]   Date of Service  10/20/2019  HPI/Events of Note  Firm abdominal distension, Bladder scan suggested > 999 ml of fluid but bladder cath revealed 10 ml of urine, r/o ascites.  eICU Interventions  Abdominal ultrasound ordered, KUB ordered .        Kerry Kass Marcoantonio Legault 10/20/2019, 3:46 AM

## 2019-10-20 NOTE — Progress Notes (Signed)
eLink Physician-Brief Progress Note Patient Name: Joshua Massey DOB: 1975-12-20 MRN: [id number]   Date of Service  10/20/2019  HPI/Events of Note  Persistent cough despite Robitussin Patient seen coughing but not in acute distress  eICU Interventions  Will add Tessalon Perles     Intervention Category Intermediate Interventions: Other:  Judd Lien 10/20/2019, 8:31 PM

## 2019-10-20 NOTE — Progress Notes (Signed)
[place] Progress Note Patient Name: Joshua Massey DOB: [birth year] MRN: [id number]   Date of Service  10/20/2019  HPI/Events of Note  Pt with an indwelling foley catheter without and order.  eICU Interventions  Order to continue Foley entered.        [name] 10/20/2019, 2:24 AM

## 2019-10-20 NOTE — Progress Notes (Signed)
[place] for IV Heparin Indication: atrial fibrillation  No Known Allergies  Patient Measurements: Height: 5\' 11"  (180.3 cm) Weight: (!) 165.8 kg (365 lb 8.4 oz) IBW/kg (Calculated) : 75.3 Heparin Dosing Weight: 106 kg  Vital Signs: Temp: 96.5 F (35.8 C) (05/02 0800) Temp Source: Axillary (05/02 0800) BP: 134/85 (05/02 0800) Pulse Rate: 69 (05/02 0800)  Labs: Recent Labs    10/18/19 0222 10/18/19 1346 10/18/19 2200 10/18/19 2200 10/19/19 0713 10/19/19 1100 10/19/19 1527 10/20/19 0500  HGB 9.2*  --   --   --   --   --   --  9.7*  HCT 30.1*  --   --   --   --   --   --  30.5*  PLT 153  --   --   --   --   --   --  155  APTT 126*   < > 95*  --  85*  --   --  60*  HEPARINUNFRC 1.98*  --   --   --  1.22*  --   --  0.75*  CREATININE 5.63*   < > 4.98*   < >  --  4.09* 3.98* 3.22*   < > = values in this interval not displayed.    Estimated Creatinine Clearance: 47.1 mL/min (A) (by C-G formula based on SCr of 3.22 mg/dL (H)).   Assessment: 44 yr old male presented with severe fluid overload and was found to have COVID. He is on apixaban PTA for hx a fib (last dose on 4/27@1407 ). Scr continues to increase from 4.81 to 5.48 (pt refusing HD). Pharmacy was consulted to transition pt from apixaban to IV heparin.  Given recent apixaban dosing, will monitor both aPTT and heparin level until correlated.   PTT slightly subtherapeutic this AM (60 sec) on gtt at 900 units/hr. Heparin 0.75 -still elevated due to recent apixaban. No bleeding noted.  Goal of Therapy:  Heparin level 0.3-0.7 units/ml aPTT 66-102 seconds Monitor platelets by anticoagulation protocol: Yes   Plan:  Increase heparin infusion to 1100 units/hr APTT in 6 hours.  Daily HL, APTT, CBC  Sloan Leiter, PharmD, BCPS, BCCCP Clinical Pharmacist Please refer to [facility] for [place] numbers 10/20/2019, 8:19 AM

## 2019-10-20 NOTE — Progress Notes (Signed)
Patient complaining of urge to urinate despite foley catheter in place and has been putting out for duration of shift. Bladder scanned patient 770ml in bladder according to scan. Called e-link and was instructed to replace foley catheter, I also requested foley catheter order.

## 2019-10-21 ENCOUNTER — Encounter (HOSPITAL_COMMUNITY): Payer: Self-pay | Admitting: Internal Medicine

## 2019-10-21 LAB — CBC
HCT: 30.9 % — ABNORMAL LOW (ref 39.0–52.0)
Hemoglobin: 9.6 g/dL — ABNORMAL LOW (ref 13.0–17.0)
MCH: 29.6 pg (ref 26.0–34.0)
MCHC: 31.1 g/dL (ref 30.0–36.0)
MCV: 95.4 fL (ref 80.0–100.0)
Platelets: 145 10*3/uL — ABNORMAL LOW (ref 150–400)
RBC: 3.24 MIL/uL — ABNORMAL LOW (ref 4.22–5.81)
RDW: 16.1 % — ABNORMAL HIGH (ref 11.5–15.5)
WBC: 6.9 10*3/uL (ref 4.0–10.5)
nRBC: 0.9 % — ABNORMAL HIGH (ref 0.0–0.2)

## 2019-10-21 LAB — RENAL FUNCTION PANEL
Albumin: 2.5 g/dL — ABNORMAL LOW (ref 3.5–5.0)
Albumin: 2.5 g/dL — ABNORMAL LOW (ref 3.5–5.0)
Anion gap: 8 (ref 5–15)
Anion gap: 7 (ref 5–15)
BUN: 46 mg/dL — ABNORMAL HIGH (ref 6–20)
BUN: 38 mg/dL — ABNORMAL HIGH (ref 6–20)
CO2: 26 mmol/L (ref 22–32)
CO2: 26 mmol/L (ref 22–32)
Calcium: 7.7 mg/dL — ABNORMAL LOW (ref 8.9–10.3)
Calcium: 7.7 mg/dL — ABNORMAL LOW (ref 8.9–10.3)
Chloride: 106 mmol/L (ref 98–111)
Chloride: 104 mmol/L (ref 98–111)
Creatinine, Ser: 2.67 mg/dL — ABNORMAL HIGH (ref 0.61–1.24)
Creatinine, Ser: 2.38 mg/dL — ABNORMAL HIGH (ref 0.61–1.24)
GFR calc Af Amer: 33 mL/min — ABNORMAL LOW (ref >60)
GFR calc Af Amer: 38 mL/min — ABNORMAL LOW (ref >60)
GFR calc non Af Amer: 28 mL/min — ABNORMAL LOW (ref >60)
GFR calc non Af Amer: 32 mL/min — ABNORMAL LOW (ref >60)
Glucose, Bld: 140 mg/dL — ABNORMAL HIGH (ref 70–99)
Glucose, Bld: 124 mg/dL — ABNORMAL HIGH (ref 70–99)
Phosphorus: 2.6 mg/dL (ref 2.5–4.6)
Phosphorus: 2.3 mg/dL — ABNORMAL LOW (ref 2.5–4.6)
Potassium: 4.7 mmol/L (ref 3.5–5.1)
Potassium: 4.4 mmol/L (ref 3.5–5.1)
Sodium: 140 mmol/L (ref 135–145)
Sodium: 137 mmol/L (ref 135–145)

## 2019-10-21 LAB — GLUCOSE, CAPILLARY
Glucose-Capillary: 105 mg/dL — ABNORMAL HIGH (ref 70–99)
Glucose-Capillary: 115 mg/dL — ABNORMAL HIGH (ref 70–99)
Glucose-Capillary: 118 mg/dL — ABNORMAL HIGH (ref 70–99)
Glucose-Capillary: 120 mg/dL — ABNORMAL HIGH (ref 70–99)
Glucose-Capillary: 126 mg/dL — ABNORMAL HIGH (ref 70–99)

## 2019-10-21 LAB — PROCALCITONIN: Procalcitonin: 0.28 ng/mL

## 2019-10-21 LAB — MAGNESIUM: Magnesium: 2.4 mg/dL (ref 1.7–2.4)

## 2019-10-21 LAB — HEPARIN LEVEL (UNFRACTIONATED): Heparin Unfractionated: 0.65 IU/mL (ref 0.30–0.70)

## 2019-10-21 LAB — APTT: aPTT: 86 seconds — ABNORMAL HIGH (ref 24–36)

## 2019-10-21 LAB — D-DIMER, QUANTITATIVE: D-Dimer, Quant: 7.89 ug/mL-FEU — ABNORMAL HIGH (ref 0.00–0.50)

## 2019-10-21 LAB — BRAIN NATRIURETIC PEPTIDE: B Natriuretic Peptide: 1274.2 pg/mL — ABNORMAL HIGH (ref 0.0–100.0)

## 2019-10-21 LAB — C-REACTIVE PROTEIN: CRP: 3.6 mg/dL — ABNORMAL HIGH (ref <1.0)

## 2019-10-21 MED ORDER — ORAL CARE MOUTH RINSE
15.0000 mL | Freq: Two times a day (BID) | OROMUCOSAL | Status: DC
  Administered 2019-10-21 – 2019-10-23 (×5): 15 mL via OROMUCOSAL

## 2019-10-21 MED ORDER — LOPERAMIDE HCL 2 MG PO CAPS
2.0000 mg | ORAL_CAPSULE | Freq: Once | ORAL | Status: AC | PRN
  Administered 2019-10-21: 2 mg via ORAL
  Filled 2019-10-21: qty 1

## 2019-10-21 NOTE — Progress Notes (Addendum)
PROGRESS NOTE                                                                                                                                                                                                             Patient Demographics:    Joshua Massey, is a 44 y.o. male, DOB - Apr 22, 1976, PXT:[id number]  Outpatient Primary MD for the patient is [name], [name], MD    LOS - 6  Admit date - 10/11/2019    Chief Complaint  Patient presents with  . Leg Swelling  . Abdominal Pain       Brief Narrative  - Joshua Massey is a 44 y.o. male with medical history significant of IDDM, HTN, and chronic systolic heart failure EF 20-25%,CKD stage IV, morbid obesity, poor compliance with medications, presented with increasing redness and increasing short of breath for 3 weeks, he ran out of his diuretics few weeks ago.  In the ER he was diagnosed with acute on chronic combined systolic diastolic heart failure with massive fluid overload, AKI on CKD 4, incidental Covid infection and admitted to the hospital.   Subjective:   Patient in bed, appears comfortable, denies any headache, no fever, no chest pain or pressure, no shortness of breath , no abdominal pain. No focal weakness.   Assessment  & Plan :      1. Acute Hypoxic Resp. Failure due to Severe acute on chronic combined systolic and diastolic heart failure with EF of 25%, Severe right ventricular systolic dysfunction along with AKI on CKD 5 with anasarca.  He was seen by cardiology and nephrology, despite aggressive diuretics his urine output was minimal and finally on 10/18/2019 he was transferred to ICU, right IJ dialysis catheter was placed by critical care and CRRT was started by the nephrology team on 10/18/19.  He is a poor candidate for long-term HD due to biventricular heart failure. Continue combination of amiodarone, Coreg, BiDil and diuretics.  Renal function precludes  the use of ACE/ARB or Entresto.   Acute Covid 19 Viral Pneumonitis during the ongoing 2020 Covid 19 Pandemic - this seems most likely to be incidental finding will monitor, since he is hospitalized finished remdesivir course along with short course of steroids which will and today (avoid further fluid retention). Encouraged the patient to sit up in chair in the daytime  use I-S and flutter valve for pulmonary toiletry and then prone in bed when at night.  Will advance activity and titrate down oxygen as possible. Low grade fever 10/18/19 with mild rise in Procalcitonin - could be from Covid, [facility] 2 sets ( 10/18/19) negative so far continue to monitor, appears non toxic.      Recent Labs  Lab 10/15/19 1107 10/15/19 1201 10/15/19 1702 10/16/19 0406 10/16/19 0733 10/17/19 0413 10/18/19 0222 10/20/19 0500 10/21/19 0227 10/21/19 0659  CRP  --   --    < > 5.2*  --  5.7* 5.6* 3.3*  --  3.6*  DDIMER  --   --    < > 13.60*  --  13.94* 13.49* 10.15* 7.89*  --   BNP 1,305.3*  --   --   --   --  1,504.6* 2,531.6* 1,374.3* 1,274.2*  --   PROCALCITON  --   --    < >  --  0.68 0.75 1.08 0.38 0.28  --   SARSCOV2NAA  --  POSITIVE*  --   --   --   --   --   --   --   --    < > = values in this interval not displayed.     3.  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  No signs of osteomyelitis on MRI, no signs of cellulitis, seen by orthopedic surgeon Dr. Sharol Given who recommends medical treatment, currently no signs of active infection, continue wound care with outpatient follow-up with Dr. Sharol Given post discharge.  4.  AKI on CKD 4 with anasarca.  Baseline creatinine close to 4.7.  Was oliguric despite high-dose Lasix now started on CRRT by nephrology on 10/18/2019, PCCM assisted with right IJ catheter placement, continue fluid removal.   5.  Dyslipidemia.  Continue statin.  6.  Essential hypertension.  Continue home dose Coreg and BiDil combination along with diuretics for now.  Monitor and adjust.   7.   Paroxysmal atrial fibrillation [place] vas 2 score of greater than 3.  For now on amiodarone and beta-blocker for rate control, heparin drip >> lovenox.  Will monitor.  Cardiology on board. Stable TSH.  8.  Anemia of chronic disease.  No signs of active bleeding, anemia panel, oral iron supplement, PPI and monitor.  9. ++ High D-dimer.  On Lovenox ( later to Eliquis)  for paroxysmal A. Fib.  Not doing any imaging for DVT as management will not change he is already on full anticoagulation and needs to be on it for his high [place] vas 2 score.  10 Flat affect, he appears depressed but not suicidal homicidal, trial of Lexapro and monitor.    Condition - Fair  Family Communication  :    Mother [phone] on 10/17/19 at 9:35 AM.  Message left. [name] [phone] on 10/17/2019 updated, 10/20/19 at 8:51 AM, over 15 rings no response.  Code Status :  Full  Diet :   Diet Order            Diet renal with fluid restriction Fluid restriction: 1200 mL Fluid; Room service appropriate? Yes; Fluid consistency: Thin  Diet effective now               Disposition Plan  : Stay in the inpatient hospital in ICU for severe acute on chronic combined systolic and diastolic CHF, AKI on CKD 4, COVID-19 infection.  Thereafter discharge home once medically stable. CRRT initiated as well.  Status is: Inpatient  Remains inpatient appropriate because:IV treatments appropriate due to intensity of illness or inability to take PO   Dispo: The patient is from: Home              Anticipated d/c is to: Home              Anticipated d/c date is: > 3 days              Patient currently is not medically stable to d/c.   Consults  :  Cards, Ortho, Renal, PCCM  Procedures  :    R.IJ HD Catheter - PCCM 10/18/19  MRI left foot.  No osteomyelitis. Charcot joints.  TTE.  1. Severe global reduction in LV systolic function; restrictive filling; 4 chamber enlargement; severe RV dysfunction; mild MR; moderate TR;  moderate pulmonary hypertension; small pericardial effusion. 2. Left ventricular ejection fraction, by estimation, is 20 to 25%. The left ventricle has severely decreased function. The left ventricle demonstrates global hypokinesis. The left ventricular internal cavity size was moderately dilated. There is mild left ventricular hypertrophy. Left ventricular diastolic parameters are consistent with Grade III diastolic dysfunction (restrictive).  3. Right ventricular systolic function is severely reduced. The right ventricular size is mildly enlarged. There is moderately elevated pulmonary artery systolic pressure.  4. Left atrial size was mildly dilated.  5. Right atrial size was mildly dilated.  6. The mitral valve is normal in structure. Mild mitral valve regurgitation. No evidence of mitral stenosis.  7. Tricuspid valve regurgitation is moderate.  8. The aortic valve has an indeterminant number of cusps. Aortic valve regurgitation is not visualized. No aortic stenosis is present.  9. The inferior vena cava is dilated in size with <50% respiratory variability, suggesting right atrial pressure of 15 mmHg.  PUD Prophylaxis :   DVT Prophylaxis  :   Heparin >> Lovenox  Lab Results  Component Value Date   PLT 145 (L) 10/21/2019    Inpatient Medications  Scheduled Meds: . amiodarone  200 mg Oral Daily  . atorvastatin  10 mg Oral Q supper  . carvedilol  25 mg Oral BID WC  . Chlorhexidine Gluconate Cloth  6 each Topical Daily  . escitalopram  10 mg Oral Daily  . ferrous sulfate  325 mg Oral BID WC  . insulin aspart  0-15 Units Subcutaneous TID WC  . insulin aspart  0-5 Units Subcutaneous QHS  . isosorbide-hydrALAZINE  1 tablet Oral BID  . multivitamin with minerals  1 tablet Oral Daily  . pantoprazole  40 mg Oral Daily  . sodium chloride flush  3 mL Intravenous Q12H   Continuous Infusions: .  prismasol BGK 4/2.5 400 mL/hr at 10/20/19 0649  .  prismasol BGK 4/2.5 300 mL/hr at  10/20/19 2044  . sodium chloride 10 mL/hr at 10/21/19 0700  . heparin 1,300 Units/hr (10/21/19 0700)  . prismasol BGK 4/2.5 1,800 mL/hr at 10/21/19 4010  . remdesivir 100 mg in NS 100 mL Stopped (10/20/19 1035)   PRN Meds:.sodium chloride, acetaminophen, benzonatate, gabapentin, guaiFENesin-dextromethorphan, heparin, LORazepam, ondansetron (ZOFRAN) IV  Antibiotics  :    Anti-infectives (From admission, onward)   Start     Dose/Rate Route Frequency Ordered Stop   10/18/19 1000  remdesivir 100 mg in sodium chloride 0.9 % 100 mL IVPB     100 mg 200 mL/hr over 30 Minutes Intravenous Daily 10/17/19 0707 10/22/19 0959   10/17/19 0800  remdesivir 200 mg in sodium chloride 0.9% 250 mL IVPB  200 mg 580 mL/hr over 30 Minutes Intravenous Once 10/17/19 8850 10/17/19 1906       Time Spent in minutes  30   Lala Lund M.D on 10/21/2019 at 9:30 AM  To page go to [email or web address] - password [facility]  [facility] -  Office  [phone]   See all Orders from today for further details    Objective:   Vitals:   10/21/19 0500 10/21/19 0600 10/21/19 0700 10/21/19 0800  BP: 117/76 122/85 117/85 116/82  Pulse: 66 66 67 65  Resp: (!) 25 (!) 23 (!) 25 18  Temp:      TempSrc:      SpO2: 90% (!) 85% (!) 89% (!) 89%  Weight: (!) 156.8 kg     Height:        Wt Readings from Last 3 Encounters:  10/21/19 (!) 156.8 kg  06/04/19 (!) 136.4 kg  12/10/18 (!) 142.9 kg     Intake/Output Summary (Last 24 hours) at 10/21/2019 0930 Last data filed at 10/21/2019 0800 Gross per 24 hour  Intake 587.84 ml  Output 3971 ml  Net -3383.16 ml     Physical Exam  Awake Alert, No new F.N deficits, flat affect [place].AT,PERRAL Supple Neck,No JVD, No cervical lymphadenopathy appriciated.  Symmetrical Chest wall movement, Good air movement bilaterally, CTAB RRR,No Gallops, Rubs or new Murmurs, No Parasternal Heave +ve B.Sounds, Abd Soft, No tenderness, No organomegaly appriciated, No rebound - guarding or  rigidity.  3 + edema in the scrotum and lower extremities, R.IJ HD Catheter, UNNA Boot L foot and leg   Data Review:    CBC Recent Labs  Lab 10/16/19 0406 10/17/19 0413 10/18/19 0222 10/20/19 0500 10/21/19 0227  WBC 4.5 4.0 4.2 6.1 6.9  HGB 9.7* 9.4* 9.2* 9.7* 9.6*  HCT 31.4* 31.0* 30.1* 30.5* 30.9*  PLT 152 146* 153 155 145*  MCV 97.8 98.4 98.0 94.4 95.4  MCH 30.2 29.8 30.0 30.0 29.6  MCHC 30.9 30.3 30.6 31.8 31.1  RDW 16.5* 16.8* 16.6* 16.1* 16.1*    Chemistries  Recent Labs  Lab 10/17/2019 2206 10/15/19 1702 10/16/19 0406 10/17/19 0413 10/17/19 0413 10/18/19 0222 10/18/19 1540 10/18/19 2134 10/18/19 2200 10/18/19 2200 10/19/19 0713 10/19/19 1100 10/19/19 1527 10/20/19 0500 10/20/19 1541 10/21/19 0227  NA 141  --    < > 142   < > 141   < >  --  140   < >  --  142 139 138 138 140  K 4.6  --    < > 4.7   < > 4.9   < >  --  5.1   < >  --  4.8 5.0 4.6 4.7 4.7  CL 108  --    < > 108   < > 110   < >  --  109   < >  --  108 106 104 107 106  CO2 19*  --    < > 21*   < > 25   < >  --  18*   < >  --  21* 21* 21* 22 26  GLUCOSE 103*  --    < > 107*   < > 110*   < >  --  130*   < >  --  114* 122* 147* 148* 140*  BUN 95*  --    < > 104*   < > 111*   < >  --  102*   < >  --  85* 79* 62* 53* 46*  CREATININE 4.81*  --    < > 5.48*   < > 5.63*   < >  --  4.98*   < >  --  4.09* 3.98* 3.22* 2.90* 2.67*  CALCIUM 8.3*  --    < > 7.9*   < > 7.9*   < >  --  7.8*   < >  --  7.9* 7.7* 7.8* 7.7* 7.7*  AST 37  --   --   --   --   --   --   --  31  --   --   --   --   --   --   --   ALT 17  --   --   --   --   --   --   --  13  --   --   --   --   --   --   --   ALKPHOS 90  --   --   --   --   --   --   --  87  --   --   --   --   --   --   --   BILITOT 1.3*  --   --   --   --   --   --   --  1.5*  --   --   --   --   --   --   --   MG  --   --    < > 2.1  --  2.2  --   --   --   --   --  2.4  --  2.4  --  2.4  AMMONIA  --   --   --   --   --   --   --  77*  --   --   --   --   --   --    --   --   TSH  --   --   --   --   --   --   --   --   --   --  2.828  --   --   --   --   --   HGBA1C  --  6.3*  --   --   --   --   --   --   --   --   --   --   --   --   --   --    < > = values in this interval not displayed.     ------------------------------------------------------------------------------------------------------------------ No results for input(s): CHOL, HDL, LDLCALC, TRIG, CHOLHDL, LDLDIRECT in the last 72 hours.  Lab Results  Component Value Date   HGBA1C 6.3 (H) 10/15/2019   ------------------------------------------------------------------------------------------------------------------ Recent Labs    10/19/19 0713  TSH 2.828    Cardiac Enzymes No results for input(s): CKMB, TROPONINI, MYOGLOBIN in the last 168 hours.  Invalid input(s): CK ------------------------------------------------------------------------------------------------------------------    Component Value Date/Time   BNP 1,274.2 (H) 10/21/2019 0227   BNP 15.0 01/01/2013 1212    Micro Results Recent Results (from the past 240 hour(s))  Respiratory Panel by RT PCR (Flu A&B, Covid) - Nasopharyngeal Swab     Status: Abnormal   Collection Time: 10/15/19 12:01 PM   Specimen: Nasopharyngeal Swab  Result Value Ref Range Status   SARS Coronavirus 2 by RT  PCR POSITIVE (A) NEGATIVE Final    Comment: RESULT CALLED TO, READ BACK BY AND VERIFIED WITH: Benard Halsted RN 14:30 10/15/19 ([email or web address]) (NOTE) SARS-CoV-2 target nucleic acids are DETECTED. SARS-CoV-2 RNA is generally detectable in upper respiratory specimens  during the acute phase of infection. Positive results are indicative of the presence of the identified virus, but do not rule out bacterial infection or co-infection with other pathogens not detected by the test. Clinical correlation with patient history and other diagnostic information is necessary to determine patient infection status. The expected result is Negative. Fact Sheet for  Patients:  PinkCheek.be Fact Sheet for Healthcare Providers: GravelBags.it This test is not yet approved or cleared by the [place] FDA and  has been authorized for detection and/or diagnosis of SARS-CoV-2 by FDA under an Emergency Use Authorization (EUA).  This EUA will remain in effect (meaning this test can be used) f or the duration of  the COVID-19 declaration under Section 564(b)(1) of the Act, 21 U.S.C. section 360bbb-3(b)(1), unless the authorization is terminated or revoked sooner.    Influenza A by PCR NEGATIVE NEGATIVE Final   Influenza B by PCR NEGATIVE NEGATIVE Final    Comment: (NOTE) The Xpert Xpress SARS-CoV-2/FLU/RSV assay is intended as an aid in  the diagnosis of influenza from Nasopharyngeal swab specimens and  should not be used as a sole basis for treatment. Nasal washings and  aspirates are unacceptable for Xpert Xpress SARS-CoV-2/FLU/RSV  testing. Fact Sheet for Patients: PinkCheek.be Fact Sheet for Healthcare Providers: GravelBags.it This test is not yet approved or cleared by the [place] FDA and  has been authorized for detection and/or diagnosis of SARS-CoV-2 by  FDA under an Emergency Use Authorization (EUA). This EUA will remain  in effect (meaning this test can be used) for the duration of the  Covid-19 declaration under Section 564(b)(1) of the Act, 21  U.S.C. section 360bbb-3(b)(1), unless the authorization is  terminated or revoked. Performed at [facility], [place] [address] [place], [place] [address]   MRSA PCR Screening     Status: None   Collection Time: 10/16/19  3:12 PM   Specimen: Nasal Mucosa; Nasopharyngeal  Result Value Ref Range Status   MRSA by PCR NEGATIVE NEGATIVE Final    Comment:        The GeneXpert MRSA Assay (FDA approved for NASAL specimens only), is one component of a comprehensive MRSA  colonization surveillance program. It is not intended to diagnose MRSA infection nor to guide or monitor treatment for MRSA infections. Performed at [facility], [place] [address] [place], [place] [address]   Culture, blood (routine x 2)     Status: None (Preliminary result)   Collection Time: 10/18/19 10:21 AM   Specimen: BLOOD RIGHT HAND  Result Value Ref Range Status   Specimen Description BLOOD RIGHT HAND  Final   Special Requests   Final    BOTTLES DRAWN AEROBIC AND ANAEROBIC Blood Culture adequate volume   Culture   Final    NO GROWTH 3 DAYS Performed at [facility], [place] [address], [place], [place] [address]    Report Status PENDING  Incomplete  Culture, blood (routine x 2)     Status: None (Preliminary result)   Collection Time: 10/18/19 10:22 AM   Specimen: BLOOD RIGHT FOREARM  Result Value Ref Range Status   Specimen Description BLOOD RIGHT FOREARM  Final   Special Requests   Final    BOTTLES DRAWN AEROBIC AND  ANAEROBIC Blood Culture adequate volume   Culture   Final    NO GROWTH 3 DAYS Performed at [facility], [place] [address], [place], [place] [address]    Report Status PENDING  Incomplete    Radiology Reports DG Abd 1 View  Result Date: 10/20/2019 CLINICAL DATA:  Abdominal distension EXAM: ABDOMEN - 1 VIEW COMPARISON:  None. FINDINGS: Examination limited by patient body habitus. No visible abnormality. Postsurgical changes of both femoral necks. IMPRESSION: Limited examination due to patient body habitus. No visible acute abnormality. Electronically Signed   By: Ulyses Jarred M.D.   On: 10/20/2019 04:53   MR FOOT LEFT WO CONTRAST  Result Date: 10/15/2019 CLINICAL DATA:  Bilateral lower extremity swelling, question of osteomyelitis ulcer on the bottom of the left forefoot EXAM: MRI OF THE LEFT FOOT WITHOUT CONTRAST TECHNIQUE: Multiplanar, multisequence MR imaging of the left was performed. No intravenous contrast was administered. COMPARISON:   Multiple radiographs, most recent radiograph same day FINDINGS: Bones/Joint/Cartilage Again noted are chronic erosive type changes seen at the base of the first MTP joint there is stable dorsal on lateral subluxation of the metatarsals at the TMT joint joints with erosive type changes and chronic disruption of the Lisfranc ligaments. There is small erosive type changes seen throughout the cuneiform. There is no acute marrow signal abnormality seen throughout the osseous structures however. No large joint effusions are present. Probable healed fracture deformity of the fifth metatarsal base seen. There is diffuse chronic periosteal reaction seen through the second through fourth metatarsal shafts. Ligaments Chronic disruptions of the Lisfranc ligaments as described above. Muscles and Tendons Diffuse fatty atrophy with denervation seen throughout the forefoot. The flexor and extensor tendons appear to be grossly intact, however there is surrounding fluid signal on surrounding the flexor digitorum tendons. Soft tissues On the medial plantar forefoot there is areas of superficial ulceration with diffuse skin thickening and subcutaneous edema seen. No loculated fluid collections or sinus tract however are noted. There is diffuse dorsal subcutaneous edema seen on the dorsum of the forefoot. IMPRESSION: 1. 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 No definite evidence acute osteomyelitis. 2. Area superficial ulceration on the plantar base beneath the first metatarsal. No definite loculated fluid collection or sinus tract. 3. Diffuse subcutaneous edema and cellulitis surrounding the forefoot. Electronically Signed   By: Prudencio Pair M.D.   On: 10/15/2019 22:27   US Abdomen Limited  Result Date: 10/20/2019 CLINICAL DATA:  Ascites evaluation.  Obesity. EXAM: LIMITED ABDOMEN ULTRASOUND FOR ASCITES TECHNIQUE: Limited ultrasound survey for ascites was  performed in all four abdominal quadrants. COMPARISON:  None. FINDINGS: Moderate ascites visualized in all 4 quadrants. IMPRESSION: Moderate ascites. Electronically Signed   By: Marin Olp M.D.   On: 10/20/2019 12:13   DG Chest Port 1 View  Result Date: 10/18/2019 CLINICAL DATA:  Central line placement. EXAM: PORTABLE CHEST 1 VIEW COMPARISON:  Same day. FINDINGS: Stable cardiomegaly. Stable bilateral lung opacities are noted consistent with multifocal pneumonia. Hypoinflation of the lungs is noted. No pneumothorax or pleural effusion is noted. Right internal jugular catheter is noted with tip in expected position of right atrium. Bony thorax is unremarkable. IMPRESSION: Stable bilateral lung opacities are noted consistent with multifocal pneumonia. Hypoinflation of the lungs is noted. Right internal jugular catheter is noted with tip in expected position of right atrium. Electronically Signed   By: Marijo Conception M.D.   On: 10/18/2019 15:17  DG Chest Port 1 View  Result Date: 10/18/2019 CLINICAL DATA:  Shortness of breath. EXAM: PORTABLE CHEST 1 VIEW COMPARISON:  10/15/2019. FINDINGS: Severe cardiomegaly. Diffuse bilateral pulmonary infiltrates/edema. No pleural effusion or pneumothorax. No acute bony abnormality. Identified. IMPRESSION: Severe cardiomegaly.  Diffuse bilateral pulmonary infiltrates/edema. Electronically Signed   By: Marcello Moores  Register   On: 10/18/2019 08:35   DG Chest Port 1 View  Result Date: 10/15/2019 CLINICAL DATA:  Lower extremity edema EXAM: PORTABLE CHEST 1 VIEW COMPARISON:  05/22/2019 FINDINGS: Very low lung volumes limiting evaluation. Pulmonary vascular congestion with possible mild perihilar edema. No significant pleural effusion. No pneumothorax. Stable cardiomegaly. IMPRESSION: Pulmonary vascular congestion with possible mild edema. Electronically Signed   By: Macy Mis M.D.   On: 10/15/2019 09:11   DG Foot Complete Left  Result Date: 10/15/2019 CLINICAL DATA:   Nonhealing wound to left foot. EXAM: LEFT FOOT - COMPLETE 3+ VIEW COMPARISON:  August 29, 2018. FINDINGS: Vascular calcifications are noted. Continued chronic dislocations degenerative changes seen involving the tarsometatarsal joints. Stable defect of middle portion of second middle phalanx is noted. Diffuse soft tissue swelling is noted. No acute lytic destruction is seen to suggest osteomyelitis. IMPRESSION: Stable chronic findings as described above. No acute lytic destruction is seen to suggest osteomyelitis. Diffuse soft tissue swelling is noted. Electronically Signed   By: Marijo Conception M.D.   On: 10/15/2019 09:30   ECHOCARDIOGRAM COMPLETE  Result Date: 10/15/2019    ECHOCARDIOGRAM REPORT   Patient Name:   Joshua Massey Date of Exam: 10/15/2019 Medical Rec #:  [id number]        Height:       71.0 in Accession #:    [id number]       Weight:       299.8 lb Date of Birth:  07/02/1975        BSA:          2.504 m Patient Age:    82 years         BP:           144/99 mmHg Patient Gender: M                HR:           92 bpm. Exam Location:  Inpatient Procedure: 2D Echo, Cardiac Doppler and Color Doppler Indications:    CHF-Acute Systolic 671.24/P80.99  History:        Patient has prior history of Echocardiogram examinations. CHF,                 CAD; Risk Factors:Dyslipidemia. Non-ischemi cardiomyopathy. CKD.  Sonographer:    Clayton Lefort RDCS (AE) Referring Phys: [id number] Lequita Halt  Sonographer Comments: Patient is morbidly obese. IMPRESSIONS  1. Severe global reduction in LV systolic function; restrictive filling; 4 chamber enlargement; severe RV dysfunction; mild MR; moderate TR; moderate pulmonary hypertension; small pericardial effusion.  2. Left ventricular ejection fraction, by estimation, is 20 to 25%. The left ventricle has severely decreased function. The left ventricle demonstrates global hypokinesis. The left ventricular internal cavity size was moderately dilated. There is mild left ventricular  hypertrophy. Left ventricular diastolic parameters are consistent with Grade III diastolic dysfunction (restrictive).  3. Right ventricular systolic function is severely reduced. The right ventricular size is mildly enlarged. There is moderately elevated pulmonary artery systolic pressure.  4. Left atrial size was mildly dilated.  5. Right atrial size was mildly dilated.  6. The mitral valve is normal in  structure. Mild mitral valve regurgitation. No evidence of mitral stenosis.  7. Tricuspid valve regurgitation is moderate.  8. The aortic valve has an indeterminant number of cusps. Aortic valve regurgitation is not visualized. No aortic stenosis is present.  9. The inferior vena cava is dilated in size with <50% respiratory variability, suggesting right atrial pressure of 15 mmHg. FINDINGS  Left Ventricle: Left ventricular ejection fraction, by estimation, is 20 to 25%. The left ventricle has severely decreased function. The left ventricle demonstrates global hypokinesis. The left ventricular internal cavity size was moderately dilated. There is mild left ventricular hypertrophy. Left ventricular diastolic parameters are consistent with Grade III diastolic dysfunction (restrictive). Right Ventricle: The right ventricular size is mildly enlarged.Right ventricular systolic function is severely reduced. There is moderately elevated pulmonary artery systolic pressure. The tricuspid regurgitant velocity is 3.30 m/s, and with an assumed right atrial pressure of 15 mmHg, the estimated right ventricular systolic pressure is 95.6 mmHg. Left Atrium: Left atrial size was mildly dilated. Right Atrium: Right atrial size was mildly dilated. Pericardium: A small pericardial effusion is present. Mitral Valve: The mitral valve is normal in structure. Normal mobility of the mitral valve leaflets. Mild mitral valve regurgitation. No evidence of mitral valve stenosis. Tricuspid Valve: The tricuspid valve is normal in structure.  Tricuspid valve regurgitation is moderate . No evidence of tricuspid stenosis. Aortic Valve: The aortic valve has an indeterminant number of cusps. Aortic valve regurgitation is not visualized. No aortic stenosis is present. Aortic valve mean gradient measures 3.2 mmHg. Aortic valve peak gradient measures 6.6 mmHg. Aortic valve area, by VTI measures 2.71 cm. Pulmonic Valve: The pulmonic valve was not well visualized. Pulmonic valve regurgitation is trivial. No evidence of pulmonic stenosis. Aorta: The aortic root is normal in size and structure. Venous: The inferior vena cava is dilated in size with less than 50% respiratory variability, suggesting right atrial pressure of 15 mmHg. IAS/Shunts: No atrial level shunt detected by color flow Doppler. Additional Comments: Severe global reduction in LV systolic function; restrictive filling; 4 chamber enlargement; severe RV dysfunction; mild MR; moderate TR; moderate pulmonary hypertension; small pericardial effusion.  LEFT VENTRICLE PLAX 2D LVIDd:         6.16 cm LVIDs:         5.03 cm LV PW:         1.38 cm LV IVS:        1.53 cm LVOT diam:     2.30 cm LV SV:         55 LV SV Index:   22 LVOT Area:     4.15 cm  LV Volumes (MOD) LV vol d, MOD A2C: 318.0 ml LV vol d, MOD A4C: 215.0 ml LV vol s, MOD A2C: 232.0 ml LV vol s, MOD A4C: 177.0 ml LV SV MOD A2C:     86.0 ml LV SV MOD A4C:     215.0 ml LV SV MOD BP:      57.1 ml RIGHT VENTRICLE            IVC RV Basal diam:  4.02 cm    IVC diam: 3.59 cm RV Mid diam:    4.69 cm RV S prime:     6.31 cm/s TAPSE (M-mode): 0.8 cm LEFT ATRIUM             Index       RIGHT ATRIUM           Index LA diam:  5.20 cm 2.08 cm/m  RA Area:     23.90 cm LA Vol (A2C):   58.1 ml 23.20 ml/m RA Volume:   72.00 ml  28.75 ml/m LA Vol (A4C):   98.6 ml 39.37 ml/m LA Biplane Vol: 84.0 ml 33.54 ml/m  AORTIC VALVE AV Area (Vmax):    2.89 cm AV Area (Vmean):   2.79 cm AV Area (VTI):     2.71 cm AV Vmax:           128.60 cm/s AV Vmean:           85.780 cm/s AV VTI:            0.202 m AV Peak Grad:      6.6 mmHg AV Mean Grad:      3.2 mmHg LVOT Vmax:         89.40 cm/s LVOT Vmean:        57.620 cm/s LVOT VTI:          0.132 m LVOT/AV VTI ratio: 0.65  AORTA Ao Root diam: 3.30 cm Ao Asc diam:  3.60 cm TRICUSPID VALVE TR Peak grad:   43.6 mmHg TR Vmax:        330.00 cm/s  SHUNTS Systemic VTI:  0.13 m Systemic Diam: 2.30 cm Kirk Ruths MD Electronically signed by Kirk Ruths MD Signature Date/Time: 10/15/2019/4:19:33 PM    Final

## 2019-10-21 NOTE — Progress Notes (Signed)
[place] for IV Heparin Indication: atrial fibrillation  Assessment: 44 yr old male presented with severe fluid overload and was found to have COVID. He is on apixaban PTA for hx a fib (last dose on 4/27@1407 ). Scr continues to increase from 4.81 to 5.48 (pt refusing HD). Pharmacy was consulted to transition pt from apixaban to IV heparin.  Given recent apixaban dosing, will monitor both aPTT and heparin level until correlated.   PTT therapeutic at 80 sec  Goal of Therapy:  Heparin level 0.3-0.7 units/ml aPTT 66-102 seconds Monitor platelets by anticoagulation protocol: Yes   Plan:  Continue heparin infusion at 1300 units/hr Daily HL, APTT, CBC  Thanks for allowing pharmacy to be a part of this patient's care.  Excell Seltzer, PharmD Clinical Pharmacist

## 2019-10-21 NOTE — Progress Notes (Signed)
PT Cancellation Note  Patient Details Name: KRISTAN VOTTA MRN: [id number] DOB: [birth year]   Cancelled Treatment:    Reason Eval/Treat Not Completed: Medical issues which prohibited therapy; RN states not stable for PT today due to vomiting earlier and on NRB mask.  Will attempt again another day.   [name] 10/21/2019, 3:50 PM  [name], [place] [phone] 10/21/2019

## 2019-10-21 NOTE — Progress Notes (Signed)
Orthopedic Tech Progress Note Patient Details:  Joshua Massey 06-Feb-1976 [id number]  Ortho Devices Type of Ortho Device: Louretta Parma boot Ortho Device/Splint Location: RLE Ortho Device/Splint Interventions: Ordered, Application   Post Interventions Patient Tolerated: Well Instructions Provided: Care of [place] 10/21/2019, 6:11 PM

## 2019-10-21 NOTE — Progress Notes (Signed)
Patient ID: Joshua Massey, male   DOB: Feb 13, 1976, 44 y.o.   MRN: [id number] S: Complaining of a cough.  No worsening shortness of breath O:BP 98/72   Pulse 60   Temp (!) 97.3 F (36.3 C) (Axillary)   Resp 15   Ht 5\' 11"  (1.803 m)   Wt (!) 156.8 kg   SpO2 100%   BMI 48.21 kg/m   Intake/Output Summary (Last 24 hours) at 10/21/2019 1649 Last data filed at 10/21/2019 1500 Gross per 24 hour  Intake 625.41 ml  Output 4037 ml  Net -3411.59 ml   Intake/Output: I/O last 3 completed shifts: In: 1351.8 [P.O.:460; I.V.:745.5; IV Piggyback:146.4] Out: 5102 [Urine:356; Other:6311]  Intake/Output this shift:  Total I/O In: 281.5 [I.V.:181.6; IV Piggyback:99.9] Out: 1413 [Other:1413] Weight change: -9 kg Gen: morbidly obese AAM wearing facemask CVS: faint HS Resp: diminished BS Abd: obese, +BS, soft, NT Ext: 1+ edema bilateral lower extremities, improved scrotal edema.   Recent Labs  Lab 10/09/2019 2206 10/16/19 0406 10/18/19 1540 10/18/19 1540 10/18/19 2200 10/19/19 1100 10/19/19 1527 10/20/19 0500 10/20/19 1541 10/21/19 0227 10/21/19 1521  NA 141   < > 141   < > 140 142 139 138 138 140 137  K 4.6   < > 5.2*   < > 5.1 4.8 5.0 4.6 4.7 4.7 4.4  CL 108   < > 107   < > 109 108 106 104 107 106 104  CO2 19*   < > 20*   < > 18* 21* 21* 21* 22 26 26   GLUCOSE 103*   < > 128*   < > 130* 114* 122* 147* 148* 140* 124*  BUN 95*   < > 117*   < > 102* 85* 79* 62* 53* 46* 38*  CREATININE 4.81*   < > 5.74*   < > 4.98* 4.09* 3.98* 3.22* 2.90* 2.67* 2.38*  ALBUMIN 2.8*   < > 2.8*   < > 2.8* 2.7* 2.6* 2.6* 2.6* 2.5* 2.5*  CALCIUM 8.3*   < > 7.9*   < > 7.8* 7.9* 7.7* 7.8* 7.7* 7.7* 7.7*  PHOS  --    < > 7.4*  --   --  5.4* 5.0* 3.7 3.1 2.6 2.3*  AST 37  --   --   --  31  --   --   --   --   --   --   ALT 17  --   --   --  13  --   --   --   --   --   --    < > = values in this interval not displayed.   Liver Function Tests: Recent Labs  Lab 09/26/2019 2206 10/18/19 0222 10/18/19 2200  10/19/19 1100 10/20/19 1541 10/21/19 0227 10/21/19 1521  AST 37  --  31  --   --   --   --   ALT 17  --  13  --   --   --   --   ALKPHOS 90  --  87  --   --   --   --   BILITOT 1.3*  --  1.5*  --   --   --   --   PROT 7.3  --  7.0  --   --   --   --   ALBUMIN 2.8*   < > 2.8*   < > 2.6* 2.5* 2.5*   < > = values in this interval  not displayed.   Recent Labs  Lab 09/22/2019 2206  LIPASE 24   Recent Labs  Lab 10/18/19 2134  AMMONIA 53*   CBC: Recent Labs  Lab 10/16/19 0406 10/16/19 0406 10/17/19 0413 10/17/19 0413 10/18/19 0222 10/20/19 0500 10/21/19 0227  WBC 4.5   < > 4.0   < > 4.2 6.1 6.9  HGB 9.7*   < > 9.4*   < > 9.2* 9.7* 9.6*  HCT 31.4*   < > 31.0*   < > 30.1* 30.5* 30.9*  MCV 97.8  --  98.4  --  98.0 94.4 95.4  PLT 152   < > 146*   < > 153 155 145*   < > = values in this interval not displayed.   Cardiac Enzymes: No results for input(s): CKTOTAL, CKMB, CKMBINDEX, TROPONINI in the last 168 hours. CBG: Recent Labs  Lab 10/20/19 1531 10/20/19 2202 10/21/19 0742 10/21/19 1205 10/21/19 1527  GLUCAP 146* 143* 126* 118* 120*    Iron Studies: No results for input(s): IRON, TIBC, TRANSFERRIN, FERRITIN in the last 72 hours. Studies/Results: DG Abd 1 View  Result Date: 10/20/2019 CLINICAL DATA:  Abdominal distension EXAM: ABDOMEN - 1 VIEW COMPARISON:  None. FINDINGS: Examination limited by patient body habitus. No visible abnormality. Postsurgical changes of both femoral necks. IMPRESSION: Limited examination due to patient body habitus. No visible acute abnormality. Electronically Signed   By: Ulyses Jarred M.D.   On: 10/20/2019 04:53   US Abdomen Limited  Result Date: 10/20/2019 CLINICAL DATA:  Ascites evaluation.  Obesity. EXAM: LIMITED ABDOMEN ULTRASOUND FOR ASCITES TECHNIQUE: Limited ultrasound survey for ascites was performed in all four abdominal quadrants. COMPARISON:  None. FINDINGS: Moderate ascites visualized in all 4 quadrants. IMPRESSION: Moderate  ascites. Electronically Signed   By: Marin Olp M.D.   On: 10/20/2019 12:13   . amiodarone  200 mg Oral Daily  . atorvastatin  10 mg Oral Q supper  . carvedilol  25 mg Oral BID WC  . Chlorhexidine Gluconate Cloth  6 each Topical Daily  . escitalopram  10 mg Oral Daily  . ferrous sulfate  325 mg Oral BID WC  . insulin aspart  0-15 Units Subcutaneous TID WC  . insulin aspart  0-5 Units Subcutaneous QHS  . isosorbide-hydrALAZINE  1 tablet Oral BID  . multivitamin with minerals  1 tablet Oral Daily  . pantoprazole  40 mg Oral Daily  . sodium chloride flush  3 mL Intravenous Q12H    BMET    Component Value Date/Time   NA 137 10/21/2019 1521   NA 142 09/12/2016 0000   K 4.4 10/21/2019 1521   CL 104 10/21/2019 1521   CO2 26 10/21/2019 1521   GLUCOSE 124 (H) 10/21/2019 1521   BUN 38 (H) 10/21/2019 1521   BUN 58 (A) 09/12/2016 0000   CREATININE 2.38 (H) 10/21/2019 1521   CREATININE 2.63 (H) 12/20/2016 1613   CALCIUM 7.7 (L) 10/21/2019 1521   GFRNONAA 32 (L) 10/21/2019 1521   GFRAA 38 (L) 10/21/2019 1521   CBC    Component Value Date/Time   WBC 6.9 10/21/2019 0227   RBC 3.24 (L) 10/21/2019 0227   HGB 9.6 (L) 10/21/2019 0227   HCT 30.9 (L) 10/21/2019 0227   PLT 145 (L) 10/21/2019 0227   MCV 95.4 10/21/2019 0227   MCH 29.6 10/21/2019 0227   MCHC 31.1 10/21/2019 0227   RDW 16.1 (H) 10/21/2019 0227   LYMPHSABS 0.5 (L) 11/27/2017 1422   MONOABS 0.7 11/27/2017  1422   EOSABS 0.1 11/27/2017 1422   BASOSABS 0.0 11/27/2017 1422     Assessment/Plan: 1. 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  He has had several episodes over the past year similar to this and usually due to noncompliance with his diuretics and follow up.  He was started on a lasix drip on 10/15/19 without much improvement despite multiple doses of metolazone. 1. Started on CRRT 10/18/19 with marked improvement of volume and  electrolytes. 2. 4K/2.5Ca for all fluids. 3. Goal UF 150 ml/hr but due to dropping BP's will decrease goal to 50-100 ml/hr. 4. Non-tunneled HD catheter placed 10/18/19 2. Acute on chronic combined diastolic and systolic CHF- due to noncompliance with diuretics.  Significant anasarca and no response to lasix drip.  Echo with EF 33-38% EF, RV systolic function severely reduced and grade III DD (restrictive).  Plan as outlined above. 3. Acute hypoxic respiratory failure due to #2 and covid-19 Pneumonia- now on non-rebreather.  Treatment per primary svc 4. Left charcot join with plantar ulcer- followed by Dr. Sharol Given 5. P. Atrial fib- on amio per Cardiology. 6. Anemia of CKD - started on Aranesp 40 mcg on 10/18/19.  Continue to follow. 7. Medical noncompliance- ongoing issue, poor health literacy and insight. 8. Disposition- poor overall prognosis.  Not certain he would tolerate intermittent HD given biventricular heart failure and low bp's.  He declined starting dialysis initially and is not sure he would agree to long term dialysis.  Recommend Palliative Care consult to help set goals/limits of care.  Donetta Potts, MD [employer] [phone]

## 2019-10-21 NOTE — Progress Notes (Signed)
[place] for IV Heparin Indication: atrial fibrillation  No Known Allergies  Patient Measurements: Height: 5\' 11"  (180.3 cm) Weight: (!) 156.8 kg (345 lb 10.9 oz) IBW/kg (Calculated) : 75.3 Heparin Dosing Weight: 106 kg  Vital Signs: Temp: 97.4 F (36.3 C) (05/03 0400) Temp Source: Oral (05/03 0400) BP: 117/85 (05/03 0700) Pulse Rate: 67 (05/03 0700)  Labs: Recent Labs    10/19/19 0713 10/19/19 1100 10/20/19 0500 10/20/19 0500 10/20/19 1541 10/20/19 2300 10/21/19 0227  HGB  --   --  9.7*  --   --   --  9.6*  HCT  --   --  30.5*  --   --   --  30.9*  PLT  --   --  155  --   --   --  145*  APTT 85*   < > 60*   < > 63* 80* 86*  HEPARINUNFRC 1.22*  --  0.75*  --   --   --  0.65  CREATININE  --    < > 3.22*  --  2.90*  --  2.67*   < > = values in this interval not displayed.    Estimated Creatinine Clearance: 55 mL/min (A) (by C-G formula based on SCr of 2.67 mg/dL (H)).   Assessment: 44 yr old male presented with severe fluid overload and was found to have COVID. He is on apixaban PTA for hx a fib (last dose on 4/27@1407 ). Scr continues to increase from 4.81 to 5.48 (pt refusing HD). Pharmacy was consulted to transition pt from apixaban to IV heparin.  Given recent apixaban dosing, will monitor both aPTT and heparin level until correlated.   aPTT therapeutic this AM (86 sec) on gtt at 1300 units/hr. Heparin 0.65 -still elevated due to recent apixaban but trending down and starting to correlate. CBC stable. D-dimer trending down at 7.89. No bleeding noted.  Goal of Therapy:  Heparin level 0.3-0.7 units/ml aPTT 66-102 seconds Monitor platelets by anticoagulation protocol: Yes   Plan:  Continue heparin infusion to 1300 units/hr Daily HL, APTT, CBC  Sloan Leiter, PharmD, BCPS, BCCCP Clinical Pharmacist Please refer to [facility] for [place] numbers 10/21/2019, 7:23 AM

## 2019-10-21 NOTE — Plan of Care (Signed)

## 2019-10-22 LAB — RENAL FUNCTION PANEL
Albumin: 2.4 g/dL — ABNORMAL LOW (ref 3.5–5.0)
Albumin: 2.4 g/dL — ABNORMAL LOW (ref 3.5–5.0)
Anion gap: 6 (ref 5–15)
Anion gap: 4 — ABNORMAL LOW (ref 5–15)
BUN: 29 mg/dL — ABNORMAL HIGH (ref 6–20)
BUN: 26 mg/dL — ABNORMAL HIGH (ref 6–20)
CO2: 26 mmol/L (ref 22–32)
CO2: 27 mmol/L (ref 22–32)
Calcium: 7.6 mg/dL — ABNORMAL LOW (ref 8.9–10.3)
Calcium: 7.5 mg/dL — ABNORMAL LOW (ref 8.9–10.3)
Chloride: 103 mmol/L (ref 98–111)
Chloride: 106 mmol/L (ref 98–111)
Creatinine, Ser: 2.24 mg/dL — ABNORMAL HIGH (ref 0.61–1.24)
Creatinine, Ser: 2.16 mg/dL — ABNORMAL HIGH (ref 0.61–1.24)
GFR calc Af Amer: 40 mL/min — ABNORMAL LOW (ref >60)
GFR calc Af Amer: 42 mL/min — ABNORMAL LOW (ref >60)
GFR calc non Af Amer: 35 mL/min — ABNORMAL LOW (ref >60)
GFR calc non Af Amer: 36 mL/min — ABNORMAL LOW (ref >60)
Glucose, Bld: 95 mg/dL (ref 70–99)
Glucose, Bld: 96 mg/dL (ref 70–99)
Phosphorus: 2.2 mg/dL — ABNORMAL LOW (ref 2.5–4.6)
Phosphorus: 2.7 mg/dL (ref 2.5–4.6)
Potassium: 4.4 mmol/L (ref 3.5–5.1)
Potassium: 4.5 mmol/L (ref 3.5–5.1)
Sodium: 135 mmol/L (ref 135–145)
Sodium: 137 mmol/L (ref 135–145)

## 2019-10-22 LAB — PROCALCITONIN: Procalcitonin: 0.24 ng/mL

## 2019-10-22 LAB — GLUCOSE, CAPILLARY
Glucose-Capillary: 95 mg/dL (ref 70–99)
Glucose-Capillary: 92 mg/dL (ref 70–99)
Glucose-Capillary: 84 mg/dL (ref 70–99)
Glucose-Capillary: 91 mg/dL (ref 70–99)
Glucose-Capillary: 82 mg/dL (ref 70–99)

## 2019-10-22 LAB — C-REACTIVE PROTEIN: CRP: 8.1 mg/dL — ABNORMAL HIGH (ref <1.0)

## 2019-10-22 LAB — APTT: aPTT: 74 seconds — ABNORMAL HIGH (ref 24–36)

## 2019-10-22 LAB — CBC
HCT: 33.2 % — ABNORMAL LOW (ref 39.0–52.0)
Hemoglobin: 9.8 g/dL — ABNORMAL LOW (ref 13.0–17.0)
MCH: 29.4 pg (ref 26.0–34.0)
MCHC: 29.5 g/dL — ABNORMAL LOW (ref 30.0–36.0)
MCV: 99.7 fL (ref 80.0–100.0)
Platelets: 139 10*3/uL — ABNORMAL LOW (ref 150–400)
RBC: 3.33 MIL/uL — ABNORMAL LOW (ref 4.22–5.81)
RDW: 16.6 % — ABNORMAL HIGH (ref 11.5–15.5)
WBC: 6.5 10*3/uL (ref 4.0–10.5)
nRBC: 0.8 % — ABNORMAL HIGH (ref 0.0–0.2)

## 2019-10-22 LAB — MAGNESIUM: Magnesium: 2.3 mg/dL (ref 1.7–2.4)

## 2019-10-22 LAB — HEPARIN LEVEL (UNFRACTIONATED): Heparin Unfractionated: 0.41 IU/mL (ref 0.30–0.70)

## 2019-10-22 LAB — D-DIMER, QUANTITATIVE: D-Dimer, Quant: 4.92 ug/mL-FEU — ABNORMAL HIGH (ref 0.00–0.50)

## 2019-10-22 MED ORDER — SODIUM PHOSPHATES 45 MMOLE/15ML IV SOLN
20.0000 mmol | Freq: Once | INTRAVENOUS | Status: AC
  Administered 2019-10-22: 20 mmol via INTRAVENOUS
  Filled 2019-10-22: qty 6.67

## 2019-10-22 MED ORDER — MIDODRINE HCL 5 MG PO TABS: 5.0000 mg | ORAL_TABLET | Freq: Three times a day (TID) | ORAL | Status: DC

## 2019-10-22 MED ORDER — CARVEDILOL 6.25 MG PO TABS
12.5000 mg | ORAL_TABLET | Freq: Two times a day (BID) | ORAL | Status: DC
  Administered 2019-10-22: 12.5 mg via ORAL
  Filled 2019-10-22 (×2): qty 2

## 2019-10-22 MED ORDER — WHITE PETROLATUM EX OINT: TOPICAL_OINTMENT | CUTANEOUS | Status: DC | PRN

## 2019-10-22 MED ORDER — WHITE PETROLATUM EX OINT
TOPICAL_OINTMENT | CUTANEOUS | Status: AC
  Administered 2019-10-22: 0.2 via TOPICAL
  Filled 2019-10-22: qty 28.35

## 2019-10-22 NOTE — Progress Notes (Signed)
[place] for IV Heparin Indication: atrial fibrillation  No Known Allergies  Patient Measurements: Height: 5\' 11"  (180.3 cm) Weight: (!) 155.1 kg (341 lb 14.9 oz) IBW/kg (Calculated) : 75.3 Heparin Dosing Weight: 106 kg  Vital Signs: Temp: 97.4 F (36.3 C) (05/04 0716) Temp Source: Axillary (05/04 0716) BP: 126/79 (05/04 0900) Pulse Rate: 64 (05/04 0900)  Labs: Recent Labs    10/20/19 0500 10/20/19 0500 10/20/19 1541 10/20/19 2300 10/21/19 0227 10/21/19 1521 10/22/19 0740  HGB 9.7*   < >  --   --  9.6*  --  9.8*  HCT 30.5*  --   --   --  30.9*  --  33.2*  PLT 155  --   --   --  145*  --  139*  APTT 60*  --    < > 80* 86*  --  74*  HEPARINUNFRC 0.75*  --   --   --  0.65  --  0.41  CREATININE 3.22*   < >   < >  --  2.67* 2.38* 2.24*   < > = values in this interval not displayed.    Estimated Creatinine Clearance: 65.1 mL/min (A) (by C-G formula based on SCr of 2.24 mg/dL (H)).   Assessment: 44 yr old male presented with severe fluid overload and was found to have COVID. He is on apixaban PTA for hx a fib (last dose on 4/27@1407 ). Scr continues to increase from 4.81 to 5.48 (pt refusing HD). Pharmacy was consulted to transition pt from apixaban to IV heparin.  Given recent apixaban dosing, will monitor both aPTT and heparin level until correlated.   aPTT  74 sec and Heparin level 0.41 -both levels are correlating and therapeutic on gtt at 1300 units/hr. CBC stable. D-dimer trending down at 4.92. No bleeding noted. *Since levels are now correlating, will use heparin levels only to monitor going forward  Goal of Therapy:  Heparin level 0.3-0.7 units/ml aPTT 66-102 seconds Monitor platelets by anticoagulation protocol: Yes   Plan:  Continue heparin infusion to 1300 units/hr Daily HL and CBC  Sloan Leiter, PharmD, BCPS, BCCCP Clinical Pharmacist Please refer to [facility] for [place] numbers 10/22/2019, 9:53 AM

## 2019-10-22 NOTE — Progress Notes (Addendum)
PROGRESS NOTE                                                                                                                                                                                                             Patient Demographics:    Joshua Massey, is a 44 y.o. male, DOB - [birth year], IWL:[id number]  Outpatient Primary MD for the patient is [name], [name], MD    LOS - 7  Admit date - 09/27/2019    Chief Complaint  Patient presents with   Leg Swelling   Abdominal Pain       Brief Narrative  - Joshua Massey is a 44 y.o. male with medical history significant of IDDM, HTN, and chronic systolic heart failure EF 20-25%,CKD stage IV, morbid obesity, poor compliance with medications, presented with increasing redness and increasing short of breath for 3 weeks, he ran out of his diuretics few weeks ago.  In the ER he was diagnosed with acute on chronic combined systolic diastolic heart failure with massive fluid overload, AKI on CKD 4, incidental Covid infection and admitted to the hospital.  He had massive fluid overload and anasarca with poor renal function and oliguria he was moved to ICU for CRRT.  Currently he is stable main active issue is massive fluid overload requiring CRRT for fluid removal.   Subjective:   Patient in bed, appears comfortable, denies any headache, no fever, no chest pain or pressure, no shortness of breath , no abdominal pain. No focal weakness.   Assessment  & Plan :      1. Acute Hypoxic Resp. Failure due to Severe acute on chronic combined systolic and diastolic heart failure with EF of 25%, Severe right ventricular systolic dysfunction along with AKI on CKD 5 with anasarca with incidental COVID-19 infection.  He was seen by cardiology and nephrology, despite aggressive diuretics his urine output was minimal and finally on 10/18/2019 he was transferred to ICU, right IJ dialysis catheter  was placed by critical care and CRRT was started by the nephrology team on 10/18/19.  He is a poor candidate for long-term HD due to biventricular heart failure. Continue combination of amiodarone, Coreg, BiDil and diuretics.  Renal function precludes the use of ACE/ARB or Entresto.    Recent Labs  Lab 10/15/19 1107 10/15/19 1201 10/15/19 1702 10/16/19 0406  10/16/19 7902 10/17/19 0413 10/18/19 0222 10/20/19 0500 10/21/19 0227 10/21/19 0659  CRP  --   --    < > 5.2*  --  5.7* 5.6* 3.3*  --  3.6*  DDIMER  --   --    < > 13.60*  --  13.94* 13.49* 10.15* 7.89*  --   BNP 1,305.3*  --   --   --   --  1,504.6* 2,531.6* 1,374.3* 1,274.2*  --   PROCALCITON  --   --    < >  --  0.68 0.75 1.08 0.38 0.28  --   SARSCOV2NAA  --  POSITIVE*  --   --   --   --   --   --   --   --    < > = values in this interval not displayed.     3.  Acute Covid 19 Viral Pneumonitis during the ongoing 2020 Covid 19 Pandemic -this most likely was incidental but he has finished his treatment course of remdesivir and steroids. Encouraged the patient to sit up in chair in the daytime use I-S and flutter valve for pulmonary toiletry and then prone in bed when at night.  Will advance activity and titrate down oxygen as possible.   Low grade fever 10/18/19 with mild rise in Procalcitonin - could be from Covid, [facility] 2 sets ( drawn 10/18/19) negative so far 10/22/19 continue to monitor, appears non toxic.  Fever resolved.  4. 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  No signs of osteomyelitis on MRI, no signs of cellulitis, seen by orthopedic surgeon Dr. Sharol Given who recommends medical treatment, currently no signs of active infection, continue wound care with outpatient follow-up with Dr. Sharol Given post discharge.  5.  AKI on CKD 4 with anasarca.  Baseline creatinine close to 4.7.  Was oliguric despite high-dose Lasix now started on CRRT by nephrology on 10/18/2019, PCCM assisted with right IJ catheter placement, continue fluid removal.     6.  Essential hypertension.  Continue home dose Coreg and BiDil combination along with diuretics for now.  Monitor and adjust.  Coreg dose cut in half on 10/22/2019 as blood pressures became low during CRRT night of 10/21/2019.   7.  Paroxysmal atrial fibrillation [place] vas 2 score of greater than 3.  For now on amiodarone and beta-blocker for rate control, heparin drip . Will monitor. Cardiology on board. Stable TSH.  8.  Anemia of chronic disease.  No signs of active bleeding, anemia panel, oral iron supplement, PPI and monitor.  9. ++ High D-dimer.  On Lovenox ( later to Eliquis)  for paroxysmal A. Fib.  Not doing any imaging for DVT as management will not change he is already on full anticoagulation and needs to be on it for his high [place] vas 2 score.  10. Flat affect, he appears depressed but not suicidal or homicidal, trial of Lexapro and monitor.  11. Dyslipidemia.  Continue statin.  Condition - Fair  Family Communication  :    Mother [phone] on 10/17/19 at 9:35 AM.  Message left. [name] [phone] on 10/17/2019 updated, 10/20/19 at 8:51 AM, over 15 rings no response.  Code Status :  Full  Diet :   Diet Order            Diet renal with fluid restriction Fluid restriction: 1200 mL Fluid; Room service appropriate? Yes; Fluid consistency: Thin  Diet effective now  Disposition Plan  : Stay in the inpatient hospital in ICU for severe acute on chronic combined systolic and diastolic CHF, AKI on CKD 4, COVID-19 infection.  Thereafter discharge home once medically stable. CRRT initiated as well.  Status is: Inpatient  Remains inpatient appropriate because:IV treatments appropriate due to intensity of illness or inability to take PO   Dispo: The patient is from: Home              Anticipated d/c is to: Home              Anticipated d/c date is: > 3 days              Patient currently is not medically stable to d/c.   Consults  :  Cards, Ortho, Renal,  PCCM  Procedures  :    R.IJ HD Catheter - PCCM 10/18/19 - CRRT  MRI left foot.  No osteomyelitis. Charcot joints.  TTE.  1. Severe global reduction in LV systolic function; restrictive filling; 4 chamber enlargement; severe RV dysfunction; mild MR; moderate TR; moderate pulmonary hypertension; small pericardial effusion. 2. Left ventricular ejection fraction, by estimation, is 20 to 25%. The left ventricle has severely decreased function. The left ventricle demonstrates global hypokinesis. The left ventricular internal cavity size was moderately dilated. There is mild left ventricular hypertrophy. Left ventricular diastolic parameters are consistent with Grade III diastolic dysfunction (restrictive).  3. Right ventricular systolic function is severely reduced. The right ventricular size is mildly enlarged. There is moderately elevated pulmonary artery systolic pressure.  4. Left atrial size was mildly dilated.  5. Right atrial size was mildly dilated.  6. The mitral valve is normal in structure. Mild mitral valve regurgitation. No evidence of mitral stenosis.  7. Tricuspid valve regurgitation is moderate.  8. The aortic valve has an indeterminant number of cusps. Aortic valve regurgitation is not visualized. No aortic stenosis is present.  9. The inferior vena cava is dilated in size with <50% respiratory variability, suggesting right atrial pressure of 15 mmHg.  PUD Prophylaxis :   DVT Prophylaxis  :   Heparin gtt  Lab Results  Component Value Date   PLT 139 (L) 10/22/2019    Inpatient Medications  Scheduled Meds:  amiodarone  200 mg Oral Daily   atorvastatin  10 mg Oral Q supper   carvedilol  12.5 mg Oral BID WC   Chlorhexidine Gluconate Cloth  6 each Topical Daily   escitalopram  10 mg Oral Daily   ferrous sulfate  325 mg Oral BID WC   insulin aspart  0-15 Units Subcutaneous TID WC   insulin aspart  0-5 Units Subcutaneous QHS   isosorbide-hydrALAZINE  1  tablet Oral BID   mouth rinse  15 mL Mouth Rinse BID   multivitamin with minerals  1 tablet Oral Daily   pantoprazole  40 mg Oral Daily   sodium chloride flush  3 mL Intravenous Q12H   Continuous Infusions:   prismasol BGK 4/2.5 400 mL/hr at 10/21/19 2116    prismasol BGK 4/2.5 300 mL/hr at 10/22/19 0758   sodium chloride Stopped (10/22/19 0100)   heparin 1,300 Units/hr (10/22/19 0800)   prismasol BGK 4/2.5 1,800 mL/hr at 10/22/19 0733   PRN Meds:.sodium chloride, acetaminophen, benzonatate, gabapentin, guaiFENesin-dextromethorphan, heparin, LORazepam, ondansetron (ZOFRAN) IV  Antibiotics  :    Anti-infectives (From admission, onward)   Start     Dose/Rate Route Frequency Ordered Stop   10/18/19 1000  remdesivir 100 mg in sodium chloride  0.9 % 100 mL IVPB     100 mg 200 mL/hr over 30 Minutes Intravenous Daily 10/17/19 0707 10/21/19 1018   10/17/19 0800  remdesivir 200 mg in sodium chloride 0.9% 250 mL IVPB     200 mg 580 mL/hr over 30 Minutes Intravenous Once 10/17/19 0707 10/17/19 1906       Time Spent in minutes  30   Lala Lund M.D on 10/22/2019 at 8:36 AM  To page go to [email or web address] - password [facility]  [facility] -  Office  [phone]   See all Orders from today for further details    Objective:   Vitals:   10/22/19 0645 10/22/19 0700 10/22/19 0716 10/22/19 0800  BP: 99/69 104/75  132/71  Pulse: 63 63  66  Resp: 14 12  13   Temp:   (!) 97.4 F (36.3 C)   TempSrc:   Axillary   SpO2: 97% 97%  96%  Weight:      Height:        Wt Readings from Last 3 Encounters:  10/22/19 (!) 155.1 kg  06/04/19 (!) 136.4 kg  12/10/18 (!) 142.9 kg     Intake/Output Summary (Last 24 hours) at 10/22/2019 0836 Last data filed at 10/22/2019 0800 Gross per 24 hour  Intake 639.44 ml  Output 2348 ml  Net -1708.56 ml     Physical Exam  Awake Alert, No new F.N deficits, Normal affect Ashley.AT,PERRAL Supple Neck,No JVD, No cervical lymphadenopathy  appriciated.  Symmetrical Chest wall movement, Good air movement bilaterally, CTAB RRR,No Gallops, Rubs or new Murmurs, No Parasternal Heave +ve B.Sounds, Abd Soft, No tenderness, No organomegaly appriciated, No rebound - guarding or rigidity. 3 + edema in the scrotum and lower extremities, R.IJ HD Catheter, UNNA Boot L & R Legs   Data Review:    CBC Recent Labs  Lab 10/17/19 0413 10/18/19 0222 10/20/19 0500 10/21/19 0227 10/22/19 0740  WBC 4.0 4.2 6.1 6.9 6.5  HGB 9.4* 9.2* 9.7* 9.6* 9.8*  HCT 31.0* 30.1* 30.5* 30.9* 33.2*  PLT 146* 153 155 145* 139*  MCV 98.4 98.0 94.4 95.4 99.7  MCH 29.8 30.0 30.0 29.6 29.4  MCHC 30.3 30.6 31.8 31.1 29.5*  RDW 16.8* 16.6* 16.1* 16.1* 16.6*    Chemistries  Recent Labs  Lab 10/15/19 1702 10/16/19 0406 10/18/19 0222 10/18/19 1540 10/18/19 2134 10/18/19 2200 10/18/19 2200 10/19/19 0713 10/19/19 1100 10/19/19 1527 10/20/19 0500 10/20/19 1541 10/21/19 0227 10/21/19 1521 10/22/19 0740  NA  --    < > 141   < >  --  140   < >  --  142   < > 138 138 140 137 135  K  --    < > 4.9   < >  --  5.1   < >  --  4.8   < > 4.6 4.7 4.7 4.4 4.4  CL  --    < > 110   < >  --  109   < >  --  108   < > 104 107 106 104 103  CO2  --    < > 25   < >  --  18*   < >  --  21*   < > 21* 22 26 26 26   GLUCOSE  --    < > 110*   < >  --  130*   < >  --  114*   < > 147* 148* 140* 124* 95  BUN  --    < >  111*   < >  --  102*   < >  --  85*   < > 62* 53* 46* 38* 29*  CREATININE  --    < > 5.63*   < >  --  4.98*   < >  --  4.09*   < > 3.22* 2.90* 2.67* 2.38* 2.24*  CALCIUM  --    < > 7.9*   < >  --  7.8*   < >  --  7.9*   < > 7.8* 7.7* 7.7* 7.7* 7.6*  AST  --   --   --   --   --  31  --   --   --   --   --   --   --   --   --   ALT  --   --   --   --   --  13  --   --   --   --   --   --   --   --   --   ALKPHOS  --   --   --   --   --  87  --   --   --   --   --   --   --   --   --   BILITOT  --   --   --   --   --  1.5*  --   --   --   --   --   --   --   --    --   MG  --    < > 2.2  --   --   --   --   --  2.4  --  2.4  --  2.4  --  2.3  AMMONIA  --   --   --   --  2*  --   --   --   --   --   --   --   --   --   --   TSH  --   --   --   --   --   --   --  2.828  --   --   --   --   --   --   --   HGBA1C 6.3*  --   --   --   --   --   --   --   --   --   --   --   --   --   --    < > = values in this interval not displayed.     ------------------------------------------------------------------------------------------------------------------ No results for input(s): CHOL, HDL, LDLCALC, TRIG, CHOLHDL, LDLDIRECT in the last 72 hours.  Lab Results  Component Value Date   HGBA1C 6.3 (H) 10/15/2019   ------------------------------------------------------------------------------------------------------------------ No results for input(s): TSH, T4TOTAL, T3FREE, THYROIDAB in the last 72 hours.  Invalid input(s): FREET3  Cardiac Enzymes No results for input(s): CKMB, TROPONINI, MYOGLOBIN in the last 168 hours.  Invalid input(s): CK ------------------------------------------------------------------------------------------------------------------    Component Value Date/Time   BNP 1,274.2 (H) 10/21/2019 0227   BNP 15.0 01/01/2013 1212    Micro Results Recent Results (from the past 240 hour(s))  Respiratory Panel by RT PCR (Flu A&B, Covid) - Nasopharyngeal Swab     Status: Abnormal   Collection Time: 10/15/19 12:01 PM   Specimen: Nasopharyngeal Swab  Result Value Ref Range  Status   SARS Coronavirus 2 by RT PCR POSITIVE (A) NEGATIVE Final    Comment: RESULT CALLED TO, READ BACK BY AND VERIFIED WITH: Benard Halsted RN 14:30 10/15/19 ([email or web address]) (NOTE) SARS-CoV-2 target nucleic acids are DETECTED. SARS-CoV-2 RNA is generally detectable in upper respiratory specimens  during the acute phase of infection. Positive results are indicative of the presence of the identified virus, but do not rule out bacterial infection or co-infection with other pathogens  not detected by the test. Clinical correlation with patient history and other diagnostic information is necessary to determine patient infection status. The expected result is Negative. Fact Sheet for Patients:  PinkCheek.be Fact Sheet for Healthcare Providers: GravelBags.it This test is not yet approved or cleared by the [place] FDA and  has been authorized for detection and/or diagnosis of SARS-CoV-2 by FDA under an Emergency Use Authorization (EUA).  This EUA will remain in effect (meaning this test can be used) f or the duration of  the COVID-19 declaration under Section 564(b)(1) of the Act, 21 U.S.C. section 360bbb-3(b)(1), unless the authorization is terminated or revoked sooner.    Influenza A by PCR NEGATIVE NEGATIVE Final   Influenza B by PCR NEGATIVE NEGATIVE Final    Comment: (NOTE) The Xpert Xpress SARS-CoV-2/FLU/RSV assay is intended as an aid in  the diagnosis of influenza from Nasopharyngeal swab specimens and  should not be used as a sole basis for treatment. Nasal washings and  aspirates are unacceptable for Xpert Xpress SARS-CoV-2/FLU/RSV  testing. Fact Sheet for Patients: PinkCheek.be Fact Sheet for Healthcare Providers: GravelBags.it This test is not yet approved or cleared by the [place] FDA and  has been authorized for detection and/or diagnosis of SARS-CoV-2 by  FDA under an Emergency Use Authorization (EUA). This EUA will remain  in effect (meaning this test can be used) for the duration of the  Covid-19 declaration under Section 564(b)(1) of the Act, 21  U.S.C. section 360bbb-3(b)(1), unless the authorization is  terminated or revoked. Performed at [facility], [place] [address], [place], [place] [address]   MRSA PCR Screening     Status: None   Collection Time: 10/16/19  3:12 PM   Specimen: Nasal Mucosa; Nasopharyngeal   Result Value Ref Range Status   MRSA by PCR NEGATIVE NEGATIVE Final    Comment:        The GeneXpert MRSA Assay (FDA approved for NASAL specimens only), is one component of a comprehensive MRSA colonization surveillance program. It is not intended to diagnose MRSA infection nor to guide or monitor treatment for MRSA infections. Performed at [facility], [place] [address], [place], [place] [address]   Culture, blood (routine x 2)     Status: None (Preliminary result)   Collection Time: 10/18/19 10:21 AM   Specimen: BLOOD RIGHT HAND  Result Value Ref Range Status   Specimen Description BLOOD RIGHT HAND  Final   Special Requests   Final    BOTTLES DRAWN AEROBIC AND ANAEROBIC Blood Culture adequate volume   Culture   Final    NO GROWTH 4 DAYS Performed at [facility], [place] [address], [place], [place] [address]    Report Status PENDING  Incomplete  Culture, blood (routine x 2)     Status: None (Preliminary result)   Collection Time: 10/18/19 10:22 AM   Specimen: BLOOD RIGHT FOREARM  Result Value Ref Range Status   Specimen Description BLOOD RIGHT FOREARM  Final   Special Requests  Final    BOTTLES DRAWN AEROBIC AND ANAEROBIC Blood Culture adequate volume   Culture   Final    NO GROWTH 4 DAYS Performed at [facility], [place] [address] [place], [place] [address]    Report Status PENDING  Incomplete    Radiology Reports DG Abd 1 View  Result Date: 10/20/2019 CLINICAL DATA:  Abdominal distension EXAM: ABDOMEN - 1 VIEW COMPARISON:  None. FINDINGS: Examination limited by patient body habitus. No visible abnormality. Postsurgical changes of both femoral necks. IMPRESSION: Limited examination due to patient body habitus. No visible acute abnormality. Electronically Signed   By: Ulyses Jarred M.D.   On: 10/20/2019 04:53   MR FOOT LEFT WO CONTRAST  Result Date: 10/15/2019 CLINICAL DATA:  Bilateral lower extremity swelling, question of osteomyelitis ulcer on the  bottom of the left forefoot EXAM: MRI OF THE LEFT FOOT WITHOUT CONTRAST TECHNIQUE: Multiplanar, multisequence MR imaging of the left was performed. No intravenous contrast was administered. COMPARISON:  Multiple radiographs, most recent radiograph same day FINDINGS: Bones/Joint/Cartilage Again noted are chronic erosive type changes seen at the base of the first MTP joint there is stable dorsal on lateral subluxation of the metatarsals at the TMT joint joints with erosive type changes and chronic disruption of the Lisfranc ligaments. There is small erosive type changes seen throughout the cuneiform. There is no acute marrow signal abnormality seen throughout the osseous structures however. No large joint effusions are present. Probable healed fracture deformity of the fifth metatarsal base seen. There is diffuse chronic periosteal reaction seen through the second through fourth metatarsal shafts. Ligaments Chronic disruptions of the Lisfranc ligaments as described above. Muscles and Tendons Diffuse fatty atrophy with denervation seen throughout the forefoot. The flexor and extensor tendons appear to be grossly intact, however there is surrounding fluid signal on surrounding the flexor digitorum tendons. Soft tissues On the medial plantar forefoot there is areas of superficial ulceration with diffuse skin thickening and subcutaneous edema seen. No loculated fluid collections or sinus tract however are noted. There is diffuse dorsal subcutaneous edema seen on the dorsum of the forefoot. IMPRESSION: 1. 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 No definite evidence acute osteomyelitis. 2. Area superficial ulceration on the plantar base beneath the first metatarsal. No definite loculated fluid collection or sinus tract. 3. Diffuse subcutaneous edema and cellulitis surrounding the forefoot. Electronically Signed   By: Prudencio Pair M.D.   On:  10/15/2019 22:27   US Abdomen Limited  Result Date: 10/20/2019 CLINICAL DATA:  Ascites evaluation.  Obesity. EXAM: LIMITED ABDOMEN ULTRASOUND FOR ASCITES TECHNIQUE: Limited ultrasound survey for ascites was performed in all four abdominal quadrants. COMPARISON:  None. FINDINGS: Moderate ascites visualized in all 4 quadrants. IMPRESSION: Moderate ascites. Electronically Signed   By: Marin Olp M.D.   On: 10/20/2019 12:13   DG Chest Port 1 View  Result Date: 10/18/2019 CLINICAL DATA:  Central line placement. EXAM: PORTABLE CHEST 1 VIEW COMPARISON:  Same day. FINDINGS: Stable cardiomegaly. Stable bilateral lung opacities are noted consistent with multifocal pneumonia. Hypoinflation of the lungs is noted. No pneumothorax or pleural effusion is noted. Right internal jugular catheter is noted with tip in expected position of right atrium. Bony thorax is unremarkable. IMPRESSION: Stable bilateral lung opacities are noted consistent with multifocal pneumonia. Hypoinflation of the lungs is noted. Right internal jugular catheter is noted with tip in expected position of right atrium. Electronically Signed   By: Jeneen Rinks  Murlean Caller M.D.   On: 10/18/2019 15:17   DG Chest Port 1 View  Result Date: 10/18/2019 CLINICAL DATA:  Shortness of breath. EXAM: PORTABLE CHEST 1 VIEW COMPARISON:  10/15/2019. FINDINGS: Severe cardiomegaly. Diffuse bilateral pulmonary infiltrates/edema. No pleural effusion or pneumothorax. No acute bony abnormality. Identified. IMPRESSION: Severe cardiomegaly.  Diffuse bilateral pulmonary infiltrates/edema. Electronically Signed   By: Marcello Moores  Register   On: 10/18/2019 08:35   DG Chest Port 1 View  Result Date: 10/15/2019 CLINICAL DATA:  Lower extremity edema EXAM: PORTABLE CHEST 1 VIEW COMPARISON:  05/22/2019 FINDINGS: Very low lung volumes limiting evaluation. Pulmonary vascular congestion with possible mild perihilar edema. No significant pleural effusion. No pneumothorax. Stable cardiomegaly.  IMPRESSION: Pulmonary vascular congestion with possible mild edema. Electronically Signed   By: Macy Mis M.D.   On: 10/15/2019 09:11   DG Foot Complete Left  Result Date: 10/15/2019 CLINICAL DATA:  Nonhealing wound to left foot. EXAM: LEFT FOOT - COMPLETE 3+ VIEW COMPARISON:  August 29, 2018. FINDINGS: Vascular calcifications are noted. Continued chronic dislocations degenerative changes seen involving the tarsometatarsal joints. Stable defect of middle portion of second middle phalanx is noted. Diffuse soft tissue swelling is noted. No acute lytic destruction is seen to suggest osteomyelitis. IMPRESSION: Stable chronic findings as described above. No acute lytic destruction is seen to suggest osteomyelitis. Diffuse soft tissue swelling is noted. Electronically Signed   By: Marijo Conception M.D.   On: 10/15/2019 09:30   ECHOCARDIOGRAM COMPLETE  Result Date: 10/15/2019    ECHOCARDIOGRAM REPORT   Patient Name:   Joshua Massey Date of Exam: 10/15/2019 Medical Rec #:  [id number]        Height:       71.0 in Accession #:    [id number]       Weight:       299.8 lb Date of Birth:  1975/10/12        BSA:          2.504 m Patient Age:    35 years         BP:           144/99 mmHg Patient Gender: M                HR:           92 bpm. Exam Location:  Inpatient Procedure: 2D Echo, Cardiac Doppler and Color Doppler Indications:    CHF-Acute Systolic 829.56/O13.08  History:        Patient has prior history of Echocardiogram examinations. CHF,                 CAD; Risk Factors:Dyslipidemia. Non-ischemi cardiomyopathy. CKD.  Sonographer:    Clayton Lefort RDCS (AE) Referring Phys: [id number] Lequita Halt  Sonographer Comments: Patient is morbidly obese. IMPRESSIONS  1. Severe global reduction in LV systolic function; restrictive filling; 4 chamber enlargement; severe RV dysfunction; mild MR; moderate TR; moderate pulmonary hypertension; small pericardial effusion.  2. Left ventricular ejection fraction, by estimation, is 20  to 25%. The left ventricle has severely decreased function. The left ventricle demonstrates global hypokinesis. The left ventricular internal cavity size was moderately dilated. There is mild left ventricular hypertrophy. Left ventricular diastolic parameters are consistent with Grade III diastolic dysfunction (restrictive).  3. Right ventricular systolic function is severely reduced. The right ventricular size is mildly enlarged. There is moderately elevated pulmonary artery systolic pressure.  4. Left atrial size was mildly dilated.  5. Right atrial size was  mildly dilated.  6. The mitral valve is normal in structure. Mild mitral valve regurgitation. No evidence of mitral stenosis.  7. Tricuspid valve regurgitation is moderate.  8. The aortic valve has an indeterminant number of cusps. Aortic valve regurgitation is not visualized. No aortic stenosis is present.  9. The inferior vena cava is dilated in size with <50% respiratory variability, suggesting right atrial pressure of 15 mmHg. FINDINGS  Left Ventricle: Left ventricular ejection fraction, by estimation, is 20 to 25%. The left ventricle has severely decreased function. The left ventricle demonstrates global hypokinesis. The left ventricular internal cavity size was moderately dilated. There is mild left ventricular hypertrophy. Left ventricular diastolic parameters are consistent with Grade III diastolic dysfunction (restrictive). Right Ventricle: The right ventricular size is mildly enlarged.Right ventricular systolic function is severely reduced. There is moderately elevated pulmonary artery systolic pressure. The tricuspid regurgitant velocity is 3.30 m/s, and with an assumed right atrial pressure of 15 mmHg, the estimated right ventricular systolic pressure is 01.6 mmHg. Left Atrium: Left atrial size was mildly dilated. Right Atrium: Right atrial size was mildly dilated. Pericardium: A small pericardial effusion is present. Mitral Valve: The mitral valve  is normal in structure. Normal mobility of the mitral valve leaflets. Mild mitral valve regurgitation. No evidence of mitral valve stenosis. Tricuspid Valve: The tricuspid valve is normal in structure. Tricuspid valve regurgitation is moderate . No evidence of tricuspid stenosis. Aortic Valve: The aortic valve has an indeterminant number of cusps. Aortic valve regurgitation is not visualized. No aortic stenosis is present. Aortic valve mean gradient measures 3.2 mmHg. Aortic valve peak gradient measures 6.6 mmHg. Aortic valve area, by VTI measures 2.71 cm. Pulmonic Valve: The pulmonic valve was not well visualized. Pulmonic valve regurgitation is trivial. No evidence of pulmonic stenosis. Aorta: The aortic root is normal in size and structure. Venous: The inferior vena cava is dilated in size with less than 50% respiratory variability, suggesting right atrial pressure of 15 mmHg. IAS/Shunts: No atrial level shunt detected by color flow Doppler. Additional Comments: Severe global reduction in LV systolic function; restrictive filling; 4 chamber enlargement; severe RV dysfunction; mild MR; moderate TR; moderate pulmonary hypertension; small pericardial effusion.  LEFT VENTRICLE PLAX 2D LVIDd:         6.16 cm LVIDs:         5.03 cm LV PW:         1.38 cm LV IVS:        1.53 cm LVOT diam:     2.30 cm LV SV:         55 LV SV Index:   22 LVOT Area:     4.15 cm  LV Volumes (MOD) LV vol d, MOD A2C: 318.0 ml LV vol d, MOD A4C: 215.0 ml LV vol s, MOD A2C: 232.0 ml LV vol s, MOD A4C: 177.0 ml LV SV MOD A2C:     86.0 ml LV SV MOD A4C:     215.0 ml LV SV MOD BP:      57.1 ml RIGHT VENTRICLE            IVC RV Basal diam:  4.02 cm    IVC diam: 3.59 cm RV Mid diam:    4.69 cm RV S prime:     6.31 cm/s TAPSE (M-mode): 0.8 cm LEFT ATRIUM             Index       RIGHT ATRIUM  Index LA diam:        5.20 cm 2.08 cm/m  RA Area:     23.90 cm LA Vol (A2C):   58.1 ml 23.20 ml/m RA Volume:   72.00 ml  28.75 ml/m LA Vol (A4C):    98.6 ml 39.37 ml/m LA Biplane Vol: 84.0 ml 33.54 ml/m  AORTIC VALVE AV Area (Vmax):    2.89 cm AV Area (Vmean):   2.79 cm AV Area (VTI):     2.71 cm AV Vmax:           128.60 cm/s AV Vmean:          85.780 cm/s AV VTI:            0.202 m AV Peak Grad:      6.6 mmHg AV Mean Grad:      3.2 mmHg LVOT Vmax:         89.40 cm/s LVOT Vmean:        57.620 cm/s LVOT VTI:          0.132 m LVOT/AV VTI ratio: 0.65  AORTA Ao Root diam: 3.30 cm Ao Asc diam:  3.60 cm TRICUSPID VALVE TR Peak grad:   43.6 mmHg TR Vmax:        330.00 cm/s  SHUNTS Systemic VTI:  0.13 m Systemic Diam: 2.30 cm Kirk Ruths MD Electronically signed by Kirk Ruths MD Signature Date/Time: 10/15/2019/4:19:33 PM    Final

## 2019-10-22 NOTE — Progress Notes (Signed)
Was asked to reevaluate left foot plantar ulcer.  Patient has a history of Charcot arthropathy.  Patient was seen in consult by Dr. Sharol Given at which time he noted a 10 mm plantar ulcer with good granulation tissue.  Plan is to follow-up on an outpatient basis.  Patient is lying  in bed receiving hemodialysis.  He is on a nonrebreather mask but does answer short questions   Left foot is currently wrapped and dressing was removed.  Noted rocker-bottom deformity.  No acute Charcot flare noted no erythema no warmth.  Plantar ulcer remains same in size does not go deep to bone.  Cannot appreciate bleeding granulation tissue today.  No foul odor no drainage  We will discuss findings with Dr. Sharol Given but anticipate continued plan of outpatient follow-up.

## 2019-10-22 NOTE — Progress Notes (Signed)
Patient ID: JARMEL LINHARDT, male   DOB: [birth year], 44 y.o.   MRN: [id number] S: No events overngith O:BP 110/75 (BP Location: Right Arm)   Pulse 63   Temp (!) 97.4 F (36.3 C) (Axillary)   Resp 13   Ht 5\' 11"  (1.803 m)   Wt (!) 155.1 kg   SpO2 94%   BMI 47.69 kg/m   Intake/Output Summary (Last 24 hours) at 10/22/2019 1057 Last data filed at 10/22/2019 1000 Gross per 24 hour  Intake 641.03 ml  Output 2226 ml  Net -1584.97 ml   Intake/Output: I/O last 3 completed shifts: In: 925.9 [P.O.:60; I.V.:766; IV Piggyback:99.9] Out: 4630 [Urine:145; Other:4485]  Intake/Output this shift:  Total I/O In: 98.8 [P.O.:60; I.V.:38.8] Out: 233 [Urine:5; Other:228] Weight change: -1.7 kg Physical exam: unable to complete due to COVID + status.  In order to preserve PPE equipment and to minimize exposure to providers.  Notes from other caregivers reviewed   Recent Labs  Lab 10/18/19 2200 10/18/19 2200 10/19/19 1100 10/19/19 1527 10/20/19 0500 10/20/19 1541 10/21/19 0227 10/21/19 1521 10/22/19 0740  NA 140   < > 142 139 138 138 140 137 135  K 5.1   < > 4.8 5.0 4.6 4.7 4.7 4.4 4.4  CL 109   < > 108 106 104 107 106 104 103  CO2 18*   < > 21* 21* 21* 22 26 26 26   GLUCOSE 130*   < > 114* 122* 147* 148* 140* 124* 95  BUN 102*   < > 85* 79* 62* 53* 46* 38* 29*  CREATININE 4.98*   < > 4.09* 3.98* 3.22* 2.90* 2.67* 2.38* 2.24*  ALBUMIN 2.8*   < > 2.7* 2.6* 2.6* 2.6* 2.5* 2.5* 2.4*  CALCIUM 7.8*   < > 7.9* 7.7* 7.8* 7.7* 7.7* 7.7* 7.6*  PHOS  --   --  5.4* 5.0* 3.7 3.1 2.6 2.3* 2.2*  AST 31  --   --   --   --   --   --   --   --   ALT 13  --   --   --   --   --   --   --   --    < > = values in this interval not displayed.   Liver Function Tests: Recent Labs  Lab 10/18/19 2200 10/19/19 1100 10/21/19 0227 10/21/19 1521 10/22/19 0740  AST 31  --   --   --   --   ALT 13  --   --   --   --   ALKPHOS 87  --   --   --   --   BILITOT 1.5*  --   --   --   --   PROT 7.0  --   --   --   --    ALBUMIN 2.8*   < > 2.5* 2.5* 2.4*   < > = values in this interval not displayed.   No results for input(s): LIPASE, AMYLASE in the last 168 hours. Recent Labs  Lab 10/18/19 2134  AMMONIA 53*   CBC: Recent Labs  Lab 10/17/19 0413 10/17/19 0413 10/18/19 0222 10/18/19 0222 10/20/19 0500 10/21/19 0227 10/22/19 0740  WBC 4.0   < > 4.2   < > 6.1 6.9 6.5  HGB 9.4*   < > 9.2*   < > 9.7* 9.6* 9.8*  HCT 31.0*   < > 30.1*   < > 30.5* 30.9* 33.2*  MCV 98.4  --  98.0  --  94.4 95.4 99.7  PLT 146*   < > 153   < > 155 145* 139*   < > = values in this interval not displayed.   Cardiac Enzymes: No results for input(s): CKTOTAL, CKMB, CKMBINDEX, TROPONINI in the last 168 hours. CBG: Recent Labs  Lab 10/21/19 1527 10/21/19 1941 10/21/19 2334 10/22/19 0336 10/22/19 0712  GLUCAP 120* 115* 105* 95 92    Iron Studies: No results for input(s): IRON, TIBC, TRANSFERRIN, FERRITIN in the last 72 hours. Studies/Results: No results found. Marland Kitchen amiodarone  200 mg Oral Daily  . atorvastatin  10 mg Oral Q supper  . carvedilol  12.5 mg Oral BID WC  . Chlorhexidine Gluconate Cloth  6 each Topical Daily  . escitalopram  10 mg Oral Daily  . ferrous sulfate  325 mg Oral BID WC  . insulin aspart  0-15 Units Subcutaneous TID WC  . insulin aspart  0-5 Units Subcutaneous QHS  . isosorbide-hydrALAZINE  1 tablet Oral BID  . mouth rinse  15 mL Mouth Rinse BID  . multivitamin with minerals  1 tablet Oral Daily  . pantoprazole  40 mg Oral Daily  . sodium chloride flush  3 mL Intravenous Q12H    BMET    Component Value Date/Time   NA 135 10/22/2019 0740   NA 142 09/12/2016 0000   K 4.4 10/22/2019 0740   CL 103 10/22/2019 0740   CO2 26 10/22/2019 0740   GLUCOSE 95 10/22/2019 0740   BUN 29 (H) 10/22/2019 0740   BUN 58 (A) 09/12/2016 0000   CREATININE 2.24 (H) 10/22/2019 0740   CREATININE 2.63 (H) 12/20/2016 1613   CALCIUM 7.6 (L) 10/22/2019 0740   GFRNONAA 35 (L) 10/22/2019 0740   GFRAA 40 (L)  10/22/2019 0740   CBC    Component Value Date/Time   WBC 6.5 10/22/2019 0740   RBC 3.33 (L) 10/22/2019 0740   HGB 9.8 (L) 10/22/2019 0740   HCT 33.2 (L) 10/22/2019 0740   PLT 139 (L) 10/22/2019 0740   MCV 99.7 10/22/2019 0740   MCH 29.4 10/22/2019 0740   MCHC 29.5 (L) 10/22/2019 0740   RDW 16.6 (H) 10/22/2019 0740   LYMPHSABS 0.5 (L) 11/27/2017 1422   MONOABS 0.7 11/27/2017 1422   EOSABS 0.1 11/27/2017 1422   BASOSABS 0.0 11/27/2017 1422      Assessment/Plan: 1. 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 He has had several episodes over the past year similar to this and usually due to noncompliance with his diuretics and follow up. He was started on a lasix drip on 10/15/19 without much improvement despite multiple doses of metolazone. 1. Started on CRRT 10/18/19 with marked improvement of volume and electrolytes. 2. 4K/2.5Ca for all fluids. 3. Goal UF 150 ml/hr but due to dropping BP's and decreased goal to 50-100 ml/hr 10/21/19. 4. Non-tunneled HD catheter placed 10/18/19 2. Acute on chronic combined diastolic and systolic CHF- due to noncompliance with diuretics. Significant anasarca and no response to lasix drip. Echo with EF 06-26% EF, RV systolic function severely reduced and grade III DD (restrictive). Plan as outlined above. 3. Acute hypoxic respiratory failure due to #2 and covid-19 Pneumonia- now on non-rebreather. Treatment per primary svc 4. Hypophosphatemia- due to CRRT.  Will replete IV and follow.  5. Left charcot join with plantar ulcer- followed by Dr. Sharol Given 6. P. Atrial fib- on amio per Cardiology. 7.  Anemia of CKD - started on Aranesp 40 mcg on 10/18/19.  Continue to follow. 8. Medical noncompliance- ongoing issue, poor health literacy and insight. 9. Disposition- poor overall prognosis. Not certain he would tolerate intermittent HD given biventricular heart failure and low bp's.  He declined starting dialysis initially and is not sure he would agree to long term dialysis. Recommend Palliative Care consult to help set goals/limits of care.  Donetta Potts, MD [employer] [phone]

## 2019-10-23 ENCOUNTER — Inpatient Hospital Stay (HOSPITAL_COMMUNITY): Payer: BC Managed Care – PPO

## 2019-10-23 DIAGNOSIS — J9621 Acute and chronic respiratory failure with hypoxia: Secondary | ICD-10-CM

## 2019-10-23 DIAGNOSIS — I5043 Acute on chronic combined systolic (congestive) and diastolic (congestive) heart failure: Secondary | ICD-10-CM

## 2019-10-23 DIAGNOSIS — J9601 Acute respiratory failure with hypoxia: Secondary | ICD-10-CM | POA: Diagnosis not present

## 2019-10-23 DIAGNOSIS — I509 Heart failure, unspecified: Secondary | ICD-10-CM | POA: Diagnosis not present

## 2019-10-23 LAB — POCT I-STAT 7, (LYTES, BLD GAS, ICA,H+H)
Acid-Base Excess: 0 mmol/L (ref 0.0–2.0)
Acid-base deficit: 1 mmol/L (ref 0.0–2.0)
Bicarbonate: 28.6 mmol/L — ABNORMAL HIGH (ref 20.0–28.0)
Bicarbonate: 26.6 mmol/L (ref 20.0–28.0)
Calcium, Ion: 1.17 mmol/L (ref 1.15–1.40)
Calcium, Ion: 1.14 mmol/L — ABNORMAL LOW (ref 1.15–1.40)
HCT: 34 % — ABNORMAL LOW (ref 39.0–52.0)
HCT: 35 % — ABNORMAL LOW (ref 39.0–52.0)
Hemoglobin: 11.6 g/dL — ABNORMAL LOW (ref 13.0–17.0)
Hemoglobin: 11.9 g/dL — ABNORMAL LOW (ref 13.0–17.0)
O2 Saturation: 63 %
O2 Saturation: 78 %
Patient temperature: 97.6
Patient temperature: 97.7
Potassium: 4.2 mmol/L (ref 3.5–5.1)
Potassium: 4.2 mmol/L (ref 3.5–5.1)
Sodium: 138 mmol/L (ref 135–145)
Sodium: 138 mmol/L (ref 135–145)
TCO2: 31 mmol/L (ref 22–32)
TCO2: 28 mmol/L (ref 22–32)
pCO2 arterial: 74.7 mmHg (ref 32.0–48.0)
pCO2 arterial: 47.8 mmHg (ref 32.0–48.0)
pH, Arterial: 7.189 — CL (ref 7.350–7.450)
pH, Arterial: 7.351 (ref 7.350–7.450)
pO2, Arterial: 41 mmHg — ABNORMAL LOW (ref 83.0–108.0)
pO2, Arterial: 44 mmHg — ABNORMAL LOW (ref 83.0–108.0)

## 2019-10-23 LAB — TROPONIN I (HIGH SENSITIVITY)
Troponin I (High Sensitivity): 17 ng/L (ref <18)
Troponin I (High Sensitivity): 17 ng/L (ref <18)

## 2019-10-23 LAB — RENAL FUNCTION PANEL
Albumin: 2.3 g/dL — ABNORMAL LOW (ref 3.5–5.0)
Albumin: 2.1 g/dL — ABNORMAL LOW (ref 3.5–5.0)
Anion gap: 6 (ref 5–15)
Anion gap: 10 (ref 5–15)
BUN: 21 mg/dL — ABNORMAL HIGH (ref 6–20)
BUN: 21 mg/dL — ABNORMAL HIGH (ref 6–20)
CO2: 27 mmol/L (ref 22–32)
CO2: 23 mmol/L (ref 22–32)
Calcium: 7.7 mg/dL — ABNORMAL LOW (ref 8.9–10.3)
Calcium: 7.5 mg/dL — ABNORMAL LOW (ref 8.9–10.3)
Chloride: 104 mmol/L (ref 98–111)
Chloride: 104 mmol/L (ref 98–111)
Creatinine, Ser: 2.09 mg/dL — ABNORMAL HIGH (ref 0.61–1.24)
Creatinine, Ser: 2.12 mg/dL — ABNORMAL HIGH (ref 0.61–1.24)
GFR calc Af Amer: 44 mL/min — ABNORMAL LOW (ref >60)
GFR calc Af Amer: 43 mL/min — ABNORMAL LOW (ref >60)
GFR calc non Af Amer: 38 mL/min — ABNORMAL LOW (ref >60)
GFR calc non Af Amer: 37 mL/min — ABNORMAL LOW (ref >60)
Glucose, Bld: 89 mg/dL (ref 70–99)
Glucose, Bld: 78 mg/dL (ref 70–99)
Phosphorus: 2.4 mg/dL — ABNORMAL LOW (ref 2.5–4.6)
Phosphorus: 2.3 mg/dL — ABNORMAL LOW (ref 2.5–4.6)
Potassium: 4.3 mmol/L (ref 3.5–5.1)
Potassium: 4 mmol/L (ref 3.5–5.1)
Sodium: 137 mmol/L (ref 135–145)
Sodium: 137 mmol/L (ref 135–145)

## 2019-10-23 LAB — CBC
HCT: 33.2 % — ABNORMAL LOW (ref 39.0–52.0)
Hemoglobin: 9.8 g/dL — ABNORMAL LOW (ref 13.0–17.0)
MCH: 30.1 pg (ref 26.0–34.0)
MCHC: 29.5 g/dL — ABNORMAL LOW (ref 30.0–36.0)
MCV: 101.8 fL — ABNORMAL HIGH (ref 80.0–100.0)
Platelets: 156 10*3/uL (ref 150–400)
RBC: 3.26 MIL/uL — ABNORMAL LOW (ref 4.22–5.81)
RDW: 17 % — ABNORMAL HIGH (ref 11.5–15.5)
WBC: 7.4 10*3/uL (ref 4.0–10.5)
nRBC: 0.4 % — ABNORMAL HIGH (ref 0.0–0.2)

## 2019-10-23 LAB — POCT I-STAT EG7
Acid-Base Excess: 1 mmol/L (ref 0.0–2.0)
Bicarbonate: 27.8 mmol/L (ref 20.0–28.0)
Calcium, Ion: 1.17 mmol/L (ref 1.15–1.40)
HCT: 32 % — ABNORMAL LOW (ref 39.0–52.0)
Hemoglobin: 10.9 g/dL — ABNORMAL LOW (ref 13.0–17.0)
O2 Saturation: 62 %
Patient temperature: 97.7
Potassium: 4.3 mmol/L (ref 3.5–5.1)
Sodium: 138 mmol/L (ref 135–145)
TCO2: 30 mmol/L (ref 22–32)
pCO2, Ven: 55.4 mmHg (ref 44.0–60.0)
pH, Ven: 7.306 (ref 7.250–7.430)
pO2, Ven: 35 mmHg (ref 32.0–45.0)

## 2019-10-23 LAB — GLUCOSE, CAPILLARY
Glucose-Capillary: 73 mg/dL (ref 70–99)
Glucose-Capillary: 80 mg/dL (ref 70–99)
Glucose-Capillary: 71 mg/dL (ref 70–99)
Glucose-Capillary: 100 mg/dL — ABNORMAL HIGH (ref 70–99)
Glucose-Capillary: 66 mg/dL — ABNORMAL LOW (ref 70–99)

## 2019-10-23 LAB — D-DIMER, QUANTITATIVE: D-Dimer, Quant: 4.17 ug/mL-FEU — ABNORMAL HIGH (ref 0.00–0.50)

## 2019-10-23 LAB — HEPARIN LEVEL (UNFRACTIONATED): Heparin Unfractionated: 0.29 IU/mL — ABNORMAL LOW (ref 0.30–0.70)

## 2019-10-23 LAB — C-REACTIVE PROTEIN: CRP: 10.3 mg/dL — ABNORMAL HIGH (ref <1.0)

## 2019-10-23 LAB — CULTURE, BLOOD (ROUTINE X 2)
Culture: NO GROWTH
Culture: NO GROWTH
Special Requests: ADEQUATE
Special Requests: ADEQUATE

## 2019-10-23 LAB — PROCALCITONIN
Procalcitonin: 0.31 ng/mL
Procalcitonin: 0.33 ng/mL

## 2019-10-23 LAB — MAGNESIUM: Magnesium: 2.4 mg/dL (ref 1.7–2.4)

## 2019-10-23 MED ORDER — ETOMIDATE 2 MG/ML IV SOLN
40.0000 mg | Freq: Once | INTRAVENOUS | Status: AC
  Administered 2019-10-23: 40 mg via INTRAVENOUS

## 2019-10-23 MED ORDER — MIDAZOLAM HCL 2 MG/2ML IJ SOLN: 2.0000 mg | Freq: Once | INTRAMUSCULAR | Status: AC

## 2019-10-23 MED ORDER — MIDAZOLAM BOLUS VIA INFUSION
1.0000 mg | INTRAVENOUS | Status: DC | PRN
  Filled 2019-10-23: qty 2

## 2019-10-23 MED ORDER — MIDAZOLAM 50MG/50ML (1MG/ML) PREMIX INFUSION
0.0000 mg/h | INTRAVENOUS | Status: DC
  Administered 2019-10-23: 2 mg/h via INTRAVENOUS
  Filled 2019-10-23 (×2): qty 50

## 2019-10-23 MED ORDER — INSULIN ASPART 100 UNIT/ML ~~LOC~~ SOLN
0.0000 [IU] | SUBCUTANEOUS | Status: DC
  Administered 2019-10-25: 5 [IU] via SUBCUTANEOUS
  Administered 2019-10-25: 3 [IU] via SUBCUTANEOUS
  Administered 2019-10-25 (×2): 2 [IU] via SUBCUTANEOUS
  Administered 2019-10-26: 3 [IU] via SUBCUTANEOUS
  Administered 2019-10-26: 5 [IU] via SUBCUTANEOUS
  Administered 2019-10-26 – 2019-10-27 (×5): 3 [IU] via SUBCUTANEOUS
  Administered 2019-10-27: 5 [IU] via SUBCUTANEOUS
  Administered 2019-10-27 (×4): 3 [IU] via SUBCUTANEOUS
  Administered 2019-10-28: 5 [IU] via SUBCUTANEOUS
  Administered 2019-10-28 – 2019-10-29 (×7): 3 [IU] via SUBCUTANEOUS
  Administered 2019-10-29: 5 [IU] via SUBCUTANEOUS
  Administered 2019-10-29 (×3): 3 [IU] via SUBCUTANEOUS
  Administered 2019-10-30: 2 [IU] via SUBCUTANEOUS
  Administered 2019-10-30 (×2): 5 [IU] via SUBCUTANEOUS
  Administered 2019-10-30: 3 [IU] via SUBCUTANEOUS
  Administered 2019-10-30: 5 [IU] via SUBCUTANEOUS
  Administered 2019-10-30 – 2019-10-31 (×3): 3 [IU] via SUBCUTANEOUS

## 2019-10-23 MED ORDER — ATORVASTATIN CALCIUM 10 MG PO TABS
10.0000 mg | ORAL_TABLET | Freq: Every day | ORAL | Status: DC
  Administered 2019-10-23 – 2019-10-30 (×8): 10 mg
  Filled 2019-10-23 (×7): qty 1

## 2019-10-23 MED ORDER — POLYETHYLENE GLYCOL 3350 17 G PO PACK: 17.0000 g | PACK | Freq: Every day | ORAL | Status: DC

## 2019-10-23 MED ORDER — MIDAZOLAM HCL 2 MG/2ML IJ SOLN
2.0000 mg | INTRAMUSCULAR | Status: DC | PRN
  Administered 2019-10-23 (×2): 2 mg via INTRAVENOUS
  Filled 2019-10-23: qty 2

## 2019-10-23 MED ORDER — PANTOPRAZOLE SODIUM 40 MG IV SOLR
40.0000 mg | Freq: Every day | INTRAVENOUS | Status: DC
  Administered 2019-10-23 – 2019-10-30 (×8): 40 mg via INTRAVENOUS
  Filled 2019-10-23 (×8): qty 40

## 2019-10-23 MED ORDER — ISOSORB DINITRATE-HYDRALAZINE 20-37.5 MG PO TABS
1.0000 | ORAL_TABLET | Freq: Two times a day (BID) | ORAL | Status: DC
  Administered 2019-10-23 – 2019-10-24 (×4): 1
  Filled 2019-10-23 (×8): qty 1

## 2019-10-23 MED ORDER — ORAL CARE MOUTH RINSE
15.0000 mL | OROMUCOSAL | Status: DC
  Administered 2019-10-23 – 2019-10-31 (×76): 15 mL via OROMUCOSAL

## 2019-10-23 MED ORDER — FENTANYL 2500MCG IN NS 250ML (10MCG/ML) PREMIX INFUSION
50.0000 ug/h | INTRAVENOUS | Status: DC
  Administered 2019-10-23: 10:00:00 100 ug/h via INTRAVENOUS
  Administered 2019-10-23 – 2019-10-24 (×2): 200 ug/h via INTRAVENOUS
  Filled 2019-10-23 (×3): qty 250

## 2019-10-23 MED ORDER — CHLORHEXIDINE GLUCONATE 0.12% ORAL RINSE (MEDLINE KIT)
15.0000 mL | Freq: Two times a day (BID) | OROMUCOSAL | Status: DC
  Administered 2019-10-23 – 2019-10-31 (×17): 15 mL via OROMUCOSAL

## 2019-10-23 MED ORDER — DOCUSATE SODIUM 50 MG/5ML PO LIQD
100.0000 mg | Freq: Two times a day (BID) | ORAL | Status: DC
  Administered 2019-10-23 – 2019-10-30 (×15): 100 mg
  Filled 2019-10-23 (×14): qty 10

## 2019-10-23 MED ORDER — POLYETHYLENE GLYCOL 3350 17 G PO PACK
17.0000 g | PACK | Freq: Every day | ORAL | Status: DC
  Administered 2019-10-24 – 2019-10-30 (×6): 17 g
  Filled 2019-10-23 (×6): qty 1

## 2019-10-23 MED ORDER — MIDAZOLAM HCL 2 MG/2ML IJ SOLN
2.0000 mg | INTRAMUSCULAR | Status: DC | PRN
  Filled 2019-10-23: qty 2

## 2019-10-23 MED ORDER — FENTANYL CITRATE (PF) 100 MCG/2ML IJ SOLN: 50.0000 ug | Freq: Once | INTRAMUSCULAR | Status: DC

## 2019-10-23 MED ORDER — JEVITY 1.2 CAL PO LIQD
1000.0000 mL | ORAL | Status: DC
  Filled 2019-10-23 (×2): qty 1000

## 2019-10-23 MED ORDER — MIDAZOLAM HCL 2 MG/2ML IJ SOLN
INTRAMUSCULAR | Status: AC
  Administered 2019-10-23: 2 mg via INTRAVENOUS
  Filled 2019-10-23: qty 2

## 2019-10-23 MED ORDER — ADULT MULTIVITAMIN W/MINERALS CH
1.0000 | ORAL_TABLET | Freq: Every day | ORAL | Status: DC
  Administered 2019-10-23: 1
  Filled 2019-10-23 (×2): qty 1

## 2019-10-23 MED ORDER — AMIODARONE HCL 200 MG PO TABS
200.0000 mg | ORAL_TABLET | Freq: Every day | ORAL | Status: DC
  Administered 2019-10-23 – 2019-10-30 (×8): 200 mg
  Filled 2019-10-23 (×7): qty 1

## 2019-10-23 MED ORDER — FENTANYL BOLUS VIA INFUSION
50.0000 ug | INTRAVENOUS | Status: DC | PRN
  Filled 2019-10-23: qty 50

## 2019-10-23 MED ORDER — SODIUM PHOSPHATES 45 MMOLE/15ML IV SOLN
30.0000 mmol | Freq: Once | INTRAVENOUS | Status: AC
  Administered 2019-10-23: 30 mmol via INTRAVENOUS
  Filled 2019-10-23: qty 10

## 2019-10-23 MED ORDER — FERROUS SULFATE 300 (60 FE) MG/5ML PO SYRP
300.0000 mg | ORAL_SOLUTION | Freq: Two times a day (BID) | ORAL | Status: DC
  Administered 2019-10-24 – 2019-10-25 (×3): 300 mg
  Filled 2019-10-23 (×7): qty 5

## 2019-10-23 MED ORDER — CARVEDILOL 6.25 MG PO TABS
12.5000 mg | ORAL_TABLET | Freq: Two times a day (BID) | ORAL | Status: DC
  Administered 2019-10-23 – 2019-10-25 (×4): 12.5 mg
  Filled 2019-10-23 (×3): qty 2

## 2019-10-23 MED ORDER — DEXTROSE 50 % IV SOLN: 12.5000 g | INTRAVENOUS | Status: AC

## 2019-10-23 MED ORDER — GUAIFENESIN-DM 100-10 MG/5ML PO SYRP
10.0000 mL | ORAL_SOLUTION | Freq: Four times a day (QID) | ORAL | Status: DC | PRN
  Administered 2019-10-29: 10 mL
  Filled 2019-10-23 (×2): qty 10

## 2019-10-23 MED ORDER — DEXTROSE 50 % IV SOLN
INTRAVENOUS | Status: AC
  Administered 2019-10-23: 12.5 g via INTRAVENOUS
  Filled 2019-10-23: qty 50

## 2019-10-23 MED ORDER — DOCUSATE SODIUM 50 MG/5ML PO LIQD
100.0000 mg | Freq: Two times a day (BID) | ORAL | Status: DC
  Filled 2019-10-23: qty 10

## 2019-10-23 NOTE — Progress Notes (Signed)
PT Cancellation Note  Patient Details Name: Joshua Massey MRN: [id number] DOB: [birth year]   Cancelled Treatment:    Reason Eval/Treat Not Completed: Medical issues which prohibited therapy.  Newly intubated this am.  Will try to see when able. 10/23/2019  Ginger Carne., PT [facility] [phone]  (pager) [phone]  (office)   Tessie Fass [name] 10/23/2019, 10:03 AM

## 2019-10-23 NOTE — Progress Notes (Signed)
  Upon my evaluation this morning, patient obtundent, minimally responsive, hypoxic, PCCM at bedside already, remains hypoxic and altered despite ambo bagging, remains altered with CO2 retention, patient required emergent intubation by PCCM , patient care was transferred to [facility].  Phillips Climes MD

## 2019-10-23 NOTE — Progress Notes (Signed)
Patient ID: Joshua Massey, male   DOB: 12-26-1975, 44 y.o.   MRN: [id number] S: Suffered respiratory arrest today, found unresponsive with his mask off.  He was intubated and sent for CT of head.  He is back in the room and is hemodynamically stable. O:BP (!) 157/99   Pulse 60   Temp 97.6 F (36.4 C) (Oral)   Resp (!) 28   Ht 5\' 11"  (1.803 m)   Wt (!) 151.6 kg   SpO2 100%   BMI 46.61 kg/m   Intake/Output Summary (Last 24 hours) at 10/23/2019 1354 Last data filed at 10/23/2019 1200 Gross per 24 hour  Intake 574.48 ml  Output 2456 ml  Net -1881.52 ml   Intake/Output: I/O last 3 completed shifts: In: 968.6 [P.O.:180; I.V.:531.6; IV Piggyback:257] Out: 3231 [Urine:37; GMWNU:2725]  Intake/Output this shift:  Total I/O In: 69.6 [I.V.:69.6] Out: 556 [Other:556] Weight change: -3.5 kg Gen: intubated sedated, obese CVS: RRR no rbu Resp: CTA Abd: obese Ext: 2+ anasarca  Recent Labs  Lab 10/18/19 2200 10/19/19 1100 10/20/19 0500 10/20/19 0500 10/20/19 1541 10/20/19 1541 10/21/19 0227 10/21/19 0227 10/21/19 1521 10/22/19 0740 10/22/19 1626 10/23/19 0614 10/23/19 0855 10/23/19 1000 10/23/19 1011  NA 140   < > 138   < > 138   < > 140   < > 137 135 137 137 138 138 138  K 5.1   < > 4.6   < > 4.7   < > 4.7   < > 4.4 4.4 4.5 4.3 4.2 4.3 4.2  CL 109   < > 104  --  107  --  106  --  104 103 106 104  --   --   --   CO2 18*   < > 21*  --  22  --  26  --  26 26 27 27   --   --   --   GLUCOSE 130*   < > 147*  --  148*  --  140*  --  124* 95 96 89  --   --   --   BUN 102*   < > 62*  --  53*  --  46*  --  38* 29* 26* 21*  --   --   --   CREATININE 4.98*   < > 3.22*  --  2.90*  --  2.67*  --  2.38* 2.24* 2.16* 2.09*  --   --   --   ALBUMIN 2.8*   < > 2.6*  --  2.6*  --  2.5*  --  2.5* 2.4* 2.4* 2.3*  --   --   --   CALCIUM 7.8*   < > 7.8*  --  7.7*  --  7.7*  --  7.7* 7.6* 7.5* 7.7*  --   --   --   PHOS  --    < > 3.7  --  3.1  --  2.6  --  2.3* 2.2* 2.7 2.4*  --   --   --   AST 31  --    --   --   --   --   --   --   --   --   --   --   --   --   --   ALT 13  --   --   --   --   --   --   --   --   --   --   --   --   --   --    < > =  values in this interval not displayed.   Liver Function Tests: Recent Labs  Lab 10/18/19 2200 10/19/19 1100 10/22/19 0740 10/22/19 1626 10/23/19 0614  AST 31  --   --   --   --   ALT 13  --   --   --   --   ALKPHOS 87  --   --   --   --   BILITOT 1.5*  --   --   --   --   PROT 7.0  --   --   --   --   ALBUMIN 2.8*   < > 2.4* 2.4* 2.3*   < > = values in this interval not displayed.   No results for input(s): LIPASE, AMYLASE in the last 168 hours. Recent Labs  Lab 10/18/19 2134  AMMONIA 53*   CBC: Recent Labs  Lab 10/18/19 0222 10/18/19 0222 10/20/19 0500 10/20/19 0500 10/21/19 0227 10/21/19 0227 10/22/19 0740 10/22/19 0740 10/23/19 5784 10/23/19 0614 10/23/19 0855 10/23/19 1000 10/23/19 1011  WBC 4.2   < > 6.1   < > 6.9  --  6.5  --  7.4  --   --   --   --   HGB 9.2*   < > 9.7*   < > 9.6*   < > 9.8*   < > 9.8*   < > 11.6* 10.9* 11.9*  HCT 30.1*   < > 30.5*   < > 30.9*   < > 33.2*   < > 33.2*   < > 34.0* 32.0* 35.0*  MCV 98.0  --  94.4  --  95.4  --  99.7  --  101.8*  --   --   --   --   PLT 153   < > 155   < > 145*  --  139*  --  156  --   --   --   --    < > = values in this interval not displayed.   Cardiac Enzymes: No results for input(s): CKTOTAL, CKMB, CKMBINDEX, TROPONINI in the last 168 hours. CBG: Recent Labs  Lab 10/22/19 0712 10/22/19 1135 10/22/19 1616 10/22/19 2130 10/23/19 0740  GLUCAP 92 84 91 82 73    Iron Studies: No results for input(s): IRON, TIBC, TRANSFERRIN, FERRITIN in the last 72 hours. Studies/Results: Portable Chest x-ray  Result Date: 10/23/2019 CLINICAL DATA:  Intubated EXAM: PORTABLE CHEST 1 VIEW COMPARISON:  10/18/2019 chest radiograph. FINDINGS: Endotracheal tube tip is 2.8 cm above the carina. Enteric tube enters stomach with the tip not seen on this image. Right internal  jugular central venous catheter terminates over cavoatrial junction. Stable cardiomediastinal silhouette with moderate cardiomegaly. No pneumothorax. No pleural effusion. Low lung volumes. Extensive patchy opacities throughout the right greater than left lungs, slightly worsened on the right. IMPRESSION: 1. Well-positioned support structures. No pneumothorax. 2. Extensive patchy opacities throughout the right greater than left lungs, slightly worsened on the right, compatible with multifocal pneumonia versus asymmetric severe pulmonary edema given the moderate cardiomegaly. Electronically Signed   By: Ilona Sorrel M.D.   On: 10/23/2019 10:06   . amiodarone  200 mg Per Tube Daily  . atorvastatin  10 mg Per Tube Q supper  . carvedilol  12.5 mg Per Tube BID WC  . Chlorhexidine Gluconate Cloth  6 each Topical Daily  . docusate  100 mg Per Tube BID  . fentaNYL (SUBLIMAZE) injection  50 mcg Intravenous Once  . ferrous sulfate  300  mg Per Tube BID WC  . insulin aspart  0-15 Units Subcutaneous TID WC  . insulin aspart  0-5 Units Subcutaneous QHS  . isosorbide-hydrALAZINE  1 tablet Per Tube BID  . mouth rinse  15 mL Mouth Rinse BID  . multivitamin with minerals  1 tablet Per Tube Daily  . pantoprazole (PROTONIX) IV  40 mg Intravenous QHS  . [START ON 10/24/2019] polyethylene glycol  17 g Per Tube Daily  . sodium chloride flush  3 mL Intravenous Q12H    BMET    Component Value Date/Time   NA 138 10/23/2019 1011   NA 142 09/12/2016 0000   K 4.2 10/23/2019 1011   CL 104 10/23/2019 0614   CO2 27 10/23/2019 0614   GLUCOSE 89 10/23/2019 0614   BUN 21 (H) 10/23/2019 0614   BUN 58 (A) 09/12/2016 0000   CREATININE 2.09 (H) 10/23/2019 0614   CREATININE 2.63 (H) 12/20/2016 1613   CALCIUM 7.7 (L) 10/23/2019 0614   GFRNONAA 38 (L) 10/23/2019 0614   GFRAA 44 (L) 10/23/2019 0614   CBC    Component Value Date/Time   WBC 7.4 10/23/2019 0614   RBC 3.26 (L) 10/23/2019 0614   HGB 11.9 (L) 10/23/2019 1011    HCT 35.0 (L) 10/23/2019 1011   PLT 156 10/23/2019 0614   MCV 101.8 (H) 10/23/2019 0614   MCH 30.1 10/23/2019 0614   MCHC 29.5 (L) 10/23/2019 0614   RDW 17.0 (H) 10/23/2019 0614   LYMPHSABS 0.5 (L) 11/27/2017 1422   MONOABS 0.7 11/27/2017 1422   EOSABS 0.1 11/27/2017 1422   BASOSABS 0.0 11/27/2017 1422    Assessment/Plan: 1. Respiratory arrest/AMS- s/p intubated 5/5 and sent for head CT that was without acute findings except paranasal sinus disease. 2. 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 He has had several episodes over the past year similar to this and usually due to noncompliance with his diuretics and follow up. He was started on a lasix drip on 10/15/19 without much improvement despite multiple doses of metolazone. 1. Started on CRRT 10/18/19 with marked improvement of volume and electrolytes. 2. 4K/2.5Ca for all fluids. 3. Goal UF 150 ml/hr but due to dropping BP's and decreased goal to 50-100 ml/hr 10/21/19. 4. Non-tunneled HD catheter placed 10/18/19 5. Resume CRRT now that he is back in his room. 3. Acute on chronic combined diastolic and systolic CHF- due to noncompliance with diuretics. Significant anasarca and no response to lasix drip. Echo with EF 96-78% EF, RV systolic function severely reduced and grade III DD (restrictive). Plan as outlined above. 4. Acute hypoxic respiratory failure due to #2 and covid-19 Pneumonia-now on non-rebreather. Treatment per primary svc 5. Hypophosphatemia- due to CRRT.  Will replete IV and follow.  6. Left charcot join with plantar ulcer- followed by Dr. Sharol Given 7. P. Atrial fib- on amio per Cardiology. 8. Anemia of CKD -started on Aranesp 40 mcg on 10/18/19. Continue to follow. 9. Medical noncompliance- ongoing issue, poor health literacy and insight. 10. Disposition- poor overall prognosis. Not certain he would tolerate intermittent HD given  biventricular heart failure and low bp's. He declined starting dialysisinitiallyand is not sure he would agree to long term dialysis.Recommend Palliative Careconsult to help set goals/limits of care.  Donetta Potts, MD [employer] [phone]

## 2019-10-23 NOTE — Consult Note (Signed)
NAME:  Joshua Massey, MRN:  [id number], DOB:  12-Jul-1975, LOS: 8 ADMISSION DATE:  09/27/2019, CONSULTATION DATE: 10/23/2019 REFERRING MD: Dr. Waldron Labs, CHIEF COMPLAINT: Acute respiratory failure  Brief History   44 year old man.  Acute renal failure and acute respiratory failure due to acute on chronic systolic and diastolic CHF, medical noncompliance.  COVID-19 positive, consider impact of Covid pneumonitis.  CVVH started 4/30.  Intubated 5/5  History of present illness   44 year old man with known cardiomyopathy, atrial fibrillation, stage III kidney disease, obesity, admitted with dyspnea, massive volume overload and apparently off his diuretics for several weeks.  Also found to be incidentally positive for COVID-19.  Admitted for volume removal, transition to HD on 10/18/2019 and has been tolerating CVVH.  Became acutely unresponsive 5/5, was found without oxygen in place, hypoxic.  Intubated 5/5  Past Medical History   Past Medical History:  Diagnosis Date  . 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 Chronic kidney disease   . Diabetes mellitus    adult-onset, type 2  . Exogenous obesity   . GERD (gastroesophageal reflux disease)   . Hypertension   . Left foot infection 06/21/2018  . 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 rate 16, height _0  (1.803 m), weight (!) 151.6 kg, SpO2 97 %.    Vent Mode: PRVC FiO2 (%):  [100 %] 100 % Set Rate:  [28 bmp] 28 bmp Vt Set:  [600 mL] 600 mL PEEP:  [5 cmH20] 5 cmH20 Plateau Pressure:  [25 cmH20] 25 cmH20   Intake/Output Summary (Last 24 hours) at 10/23/2019 0915 Last data filed at 10/23/2019 0700 Gross per 24 hour  Intake 606.65 ml  Output 2407 ml  Net -1800.35 ml   Filed Weights   10/21/19 0500 10/22/19 0500 10/23/19 0419  Weight: (!) 156.8 kg (!) 155.1 kg (!) 151.6 kg    Examination: General: Obese ill-appearing man, unresponsive HENT: Oropharynx clear, no significant secretions, no stridor Lungs: Coarse bilaterally, no wheezing Cardiovascular: Regular, borderline bradycardia, distant, no murmur Abdomen: Protuberant, somewhat distended and tympanic, hypoactive bowel sounds Extremities: 2+ bilateral lower extremity edema, Unna boot in place bilaterally Neuro: Briefly able to wake up to vigorous stimulation but immediately back to sleep.  Unable to answer questions, follow commands  Resolved Hospital Problem list     Assessment & Plan:  Acute hypoxemic respiratory failure, suspect multifactorial in the setting of bilateral pulmonary infiltrates.  Contribution of combined systolic and diastolic acute CHF as well as possible Covid pneumonitis.  New hospital-acquired infection but  no evidence currently for this -Intubated urgently 5/5, PRVC 8 cc/kg given suspicion that this is principally cardiogenic edema -Chest x-ray, ABG now post intubation -Was treated with remdesivir and corticosteroids at presentation empirically -VAP prevention order  set -Follow CBC, clinical status, procalcitonin off antibiotics for now.  Low threshold to start if he decompensates in any way  Acute hypoxemic encephalopathy.  Suspect due to his acute hypoxemia.  Consider alternative explanations-he is on anticoagulation -Stat head CT ordered this morning -He will require sedating medications for intubation, ventilation.  PAD protocol initiated, fentanyl infusion, Versed as needed -Lexapro started this hospitalization given flat affect, possible depression.  DC now and follow -Neurontin for now, consider hold depending on course  Acute on chronic (stage III) renal failure -Appreciate nephrology management -CVVH plans, note aggressive UF, currently net -12.4 L for the hospitalization -Note that given his medical noncompliance likely a poor candidate for long-term hemodialysis.  This will have to be sorted out as his acute issues stabilize  Acute on chronic systolic and diastolic CHF with severe volume overload.  Associated severe RV dysfunction and right heart failure Hypertension -Remains on carvedilol (dose decreased earlier this hospitalization) -Isosorbide/hydralazine as ordered -Aggressive volume removal, still has more volume excess.  Slow steady UF with CVVHD -follow troponins, although no clear evidence ACS  Atrial fibrillation -On heparin infusion -Continue amiodarone  Hyperglycemia -Sliding-scale insulin as ordered  Hyperlipidemia -Atorvastatin as ordered  Anemia of chronic disease -Follow-up for any evidence of active bleeding -Follow CBC on supplemental iron  Left plantar ulcer, Charcot foot. -No signs of cellulitis or osteomyelitis by MRI -Appreciate Dr. Jess Barters assistance, plan to continue wound care and follow with him as an outpatient assuming no acute changes  Best practice:  Diet: NPO, assess for tube feeding Pain/Anxiety/Delirium protocol (if indicated): Fentanyl infusion, Versed as needed VAP protocol (if indicated): In  place DVT prophylaxis: Therapeutic heparin GI prophylaxis: Protonix IV Glucose control: SSI Mobility:BR Code Status: Full Family Communication: Updated his [name] about morning's events 5/5. She gave me an alternate number in event she is unreachable by at [phone] >> [phone] Disposition: ICU, transfer to PCCM service  Labs   CBC: Recent Labs  Lab 10/18/19 0222 10/20/19 0500 10/21/19 0227 10/22/19 0740 10/23/19 0614  WBC 4.2 6.1 6.9 6.5 7.4  HGB 9.2* 9.7* 9.6* 9.8* 9.8*  HCT 30.1* 30.5* 30.9* 33.2* 33.2*  MCV 98.0 94.4 95.4 99.7 101.8*  PLT 153 155 145* 139* 226    Basic Metabolic Panel: Recent Labs  Lab 10/19/19 1100 10/19/19 1527 10/20/19 0500 10/20/19 1541 10/21/19 0227 10/21/19 1521 10/22/19 0740 10/22/19 1626 10/23/19 0614  NA 142   < > 138   < > 140 137 135 137 137  K 4.8   < > 4.6   < > 4.7 4.4 4.4 4.5 4.3  CL 108   < > 104   < > 106 104 103 106 104  CO2 21*   < > 21*   < > _0 GLUCOSE 114*   < > 147*   < > 140* 124* 95 96 89  BUN 85*   < > 62*   < > 46* 38* 29* 26* 21*  CREATININE 4.09*   < > 3.22*   < > 2.67* 2.38* 2.24* 2.16* 2.09*  CALCIUM 7.9*   < > 7.8*   < > 7.7* 7.7* 7.6* 7.5* 7.7*  MG 2.4  --  2.4  --  2.4  --  2.3  --  2.4  PHOS 5.4*   < > 3.7   < > 2.6 2.3* 2.2* 2.7 2.4*   < > = values in this interval not displayed.   GFR: Estimated Creatinine Clearance: 68.9 mL/min (A) (by C-G formula based on SCr of 2.09 mg/dL (H)). Recent Labs  Lab 10/20/19 0500 10/21/19 0227 10/22/19 0740 10/23/19 0614  PROCALCITON 0.38 0.28 0.24 0.31  WBC 6.1 6.9 6.5 7.4    Liver Function Tests: Recent Labs  Lab 10/18/19 2200 10/19/19 1100 10/21/19 0227 10/21/19 1521 10/22/19 0740 10/22/19 1626 10/23/19 0614  AST 31  --   --   --   --   --   --   ALT 13  --   --   --   --   --   --   ALKPHOS 87  --   --   --   --   --   --   BILITOT 1.5*  --   --   --   --   --   --   PROT 7.0  --   --   --   --   --   --   ALBUMIN 2.8*   < >  2.5* 2.5* 2.4* 2.4* 2.3*   < > = values in this interval not displayed.   No results for input(s): LIPASE, AMYLASE in the last 168 hours. Recent Labs  Lab 10/18/19 2134  AMMONIA 53*    ABG    Component Value Date/Time   PHART 7.509 (H) 02/16/2012 1551   PCO2ART 34.4 (L) 02/16/2012 1551   PO2ART 62.0 (L) 02/16/2012 1551   HCO3 27.4 (H) 02/16/2012 1551   TCO2 20 (L) 06/09/2018 1756   O2SAT 65.1 02/24/2012 0626     Coagulation Profile: No results for input(s): INR, PROTIME in the last 168 hours.  Cardiac Enzymes: No results for input(s): CKTOTAL, CKMB, CKMBINDEX, TROPONINI in the last 168 hours.  HbA1C: Hgb A1c MFr Bld  Date/Time Value Ref Range Status  10/15/2019 05:02 PM 6.3 (H) 4.8 - 5.6 % Final    Comment:    (NOTE) Pre diabetes:          5.7%-6.4% Diabetes:              >6.4% Glycemic control for   <7.0% adults with diabetes   05/21/2019 11:11 PM 6.8 (H) 4.8 - 5.6 % Final    Comment:    (NOTE)         Prediabetes: 5.7 - 6.4         Diabetes: >6.4         Glycemic control for adults with diabetes: <7.0     CBG: Recent Labs  Lab 10/22/19 0712 10/22/19 1135 10/22/19 1616 10/22/19 2130 10/23/19 0740  GLUCAP 92 84 91 82 73    Review of Systems:   Unable to obtain  Past Medical History  He,  has a past medical history of CHF (congestive heart failure) ([facility]), Chronic kidney disease, Diabetes mellitus, Exogenous obesity, GERD (gastroesophageal reflux disease), Hypertension, Left foot infection (06/21/2018), and Osteomyelitis of foot, left, acute ([facility]) (07/04/2018).   Surgical History    Past Surgical History:  Procedure Laterality Date  . ANKLE SURGERY  2003   plate and screws  . APPLICATION OF WOUND VAC Left 06/12/2018   Procedure: APPLICATION OF WOUND VAC;  Surgeon: Mcarthur Rossetti, MD;  Location: [facility];  Service: Orthopedics;  Laterality: Left;  . CARDIAC CATHETERIZATION  02/16/2012  50% LAD lesion in mid vessel, otherwise no significant  obstructive CAD  . HIP PINNING  1990   bilateral  . I & D EXTREMITY Left 06/10/2018   Procedure: IRRIGATION AND DEBRIDEMENT OF FOOT;  Surgeon: Mcarthur Rossetti, MD;  Location: [facility];  Service: Orthopedics;  Laterality: Left;  . I & D EXTREMITY Left 06/12/2018   Procedure: REPEAT IRRIGATION AND DEBRIDEMENT LEFT FOOT;  Surgeon: Mcarthur Rossetti, MD;  Location: [facility];  Service: Orthopedics;  Laterality: Left;  . I & D EXTREMITY Left 07/04/2018   Procedure: DEBRIDEMENT OF LEFT FOOT;  Surgeon: Newt Minion, MD;  Location: [facility];  Service: Orthopedics;  Laterality: Left;  . IR GENERIC HISTORICAL  09/01/2016   IR FLUORO GUIDE CV LINE RIGHT 09/01/2016 Marybelle Killings, MD [facility]  . IR GENERIC HISTORICAL  09/01/2016   IR US GUIDE VASC ACCESS RIGHT 09/01/2016 Marybelle Killings, MD [facility]  . IR GENERIC HISTORICAL  09/16/2016   IR REMOVAL TUN CV CATH W/O FL 09/16/2016 Arne Cleveland, MD [facility]  . KNEE ARTHROSCOPY Left 08/30/2016   Procedure: ARTHROSCOPY KNEE;  Surgeon: Renette Butters, MD;  Location: [facility];  Service: Orthopedics;  Laterality: Left;  . LEFT AND RIGHT HEART CATHETERIZATION WITH CORONARY ANGIOGRAM N/A 02/16/2012   Procedure: LEFT AND RIGHT HEART CATHETERIZATION WITH CORONARY ANGIOGRAM;  Surgeon: Pixie Casino, MD;  Location: [facility] CATH LAB;  Service: Cardiovascular;  Laterality: N/A;  . TRANSTHORACIC ECHOCARDIOGRAM  08/31/2012   EF 35-40%, no significant wall motion abnormalities, diastolic relaxation abnormality - no longer needed LifeVest; LV cavity size mod dilated with mod conc hypertrophy, systolic function mod reduced; mild MR, LA mod dilated     Social History   reports that he has never smoked. He has never used smokeless tobacco. He reports that he does not drink alcohol or use drugs.   Family History   His family history includes Coronary artery disease in his brother; Diabetes in his father and mother; Heart disease in his mother; Heart disease (age of onset:  64) in his brother; Hyperlipidemia in his mother; Hypertension in his father and mother.   Allergies No Known Allergies   Home Medications  Prior to Admission medications   Medication Sig Start Date End Date Taking? Authorizing Provider  acetaminophen (TYLENOL) 500 MG tablet Take 1,000 mg by mouth every 6 (six) hours as needed for mild pain.   Yes [provider]  apixaban (ELIQUIS) 5 MG TABS tablet Take 1 tablet (5 mg total) by mouth 2 (two) times daily. 11/06/18  Yes [name], [name], MD  atorvastatin (LIPITOR) 10 MG tablet Take 1 tablet (10 mg total) by mouth daily with supper. Patient taking differently: Take 10 mg by mouth every morning.  08/07/18  Yes [name], [name], MD  carvedilol (COREG) 25 MG tablet TAKE 1 TABLET BY MOUTH TWICE DAILY WITH  A  MEAL. Patient taking differently: Take 25 mg by mouth 2 (two) times daily with a meal.  06/17/19  Yes [name], [name], MD  Ferrous Sulfate (IRON) 325 (65 Fe) MG TABS TAKE 1 TABLET BY MOUTH TWICE DAILY WITH MEALS  Patient taking differently: Take 325 mg by mouth 2 (two) times daily with a meal.  07/12/19  Yes [name], [name], MD  gabapentin (NEURONTIN) 300 MG capsule Take 1 capsule (300 mg total) by mouth at bedtime. Patient taking differently: Take 300 mg by mouth at bedtime as needed (pain).  10/25/18  Yes Rayburn, Neta Mends, PA-C  insulin aspart (NOVOLOG FLEXPEN) 100 UNIT/ML FlexPen Inject 0-15 Units into the skin 3 (three) times daily with meals. 0-15U/Subcu/TIDw/meals//CBG70-120:0U/CBG121-150:2U/CBG151-200:3U/CBG201-250:5U/CBG251-300:8U/CBG301-350:11U/CBG351-400:15U//CBG>400:Call MD 11/14/18  Yes [name], [name], MD  Insulin Glargine (LANTUS) 100 UNIT/ML Solostar Pen Inject 5 Units into the skin at bedtime. Patient taking differently: Inject 1 Units into the skin at bedtime.  11/06/18  Yes [name], [name], MD  isosorbide-hydrALAZINE (BIDIL) 20-37.5 MG tablet Take 1 tablet by mouth 2 (two) times daily. 07/24/19  Yes Larey Dresser, MD    Multiple Vitamin (MULTIVITAMIN WITH MINERALS) TABS tablet Take 1 tablet by mouth daily. 09/07/18  Yes Bonnielee Haff, MD  silver sulfADIAZINE (SILVADENE) 1 % cream Apply topically daily. Silvadene plus dry dressing to plantar ulcer left foot.  Change daily. Patient taking differently: Apply 1 application topically daily as needed (skin care). Silvadene plus dry dressing to plantar ulcer left foot.  Change daily. 09/07/18  Yes Bonnielee Haff, MD  torsemide (DEMADEX) 100 MG tablet Take 1 tablet (100 mg total) by mouth daily. 07/24/19 10/15/19 Yes Larey Dresser, MD  Vitamin D, Ergocalciferol, (DRISDOL) 1.25 MG (50000 UT) CAPS capsule Take 1 capsule (50,000 Units total) by mouth every 7 (seven) days. Patient taking differently: Take 50,000 Units by mouth every 7 (seven) days. Friday 11/06/18  Yes [name], [name], MD  amiodarone (PACERONE) 200 MG tablet Take 1 tablet (200 mg total) by mouth daily. 06/04/19 07/04/19  Darliss Cheney, MD  Continuous Blood Gluc Receiver (FREESTYLE LIBRE 14 DAY READER) DEVI 1 Device by Does not apply route daily. 11/06/18   [name], [name], MD  Continuous Blood Gluc Sensor (FREESTYLE LIBRE 14 DAY SENSOR) MISC Inject 1 Device into the skin 4 (four) times daily as needed. 11/06/18   [name], [name], MD  glucose blood (FREESTYLE TEST STRIPS) test strip Use as instructed 09/02/16   Ghimire, Henreitta Leber, MD  glucose monitoring kit (FREESTYLE) monitoring kit 1 each by Does not apply route 4 (four) times daily - after meals and at bedtime. 1 month Diabetic Testing Supplies for QAC-QHS accuchecks. 09/02/16   Ghimire, Henreitta Leber, MD  Insulin Pen Needle 32G X 8 MM MISC Use as directed 09/02/16   Jonetta Osgood, MD  Lancets (FREESTYLE) lancets Use as instructed 09/02/16   Ghimire, Henreitta Leber, MD  oxyCODONE-acetaminophen (PERCOCET/ROXICET) 5-325 MG tablet Take 1 tablet by mouth every 8 (eight) hours as needed for moderate pain or severe pain. Patient not taking: Reported on 10/15/2019 10/25/18    Rayburn, Neta Mends, PA-C     Critical care time: 57 min    Baltazar Apo, MD, PhD 10/23/2019, 9:55 AM [facility] [phone] or if no answer [phone]

## 2019-10-23 NOTE — Progress Notes (Signed)
Pt transported from 2M11 to CT and back without incident.

## 2019-10-23 NOTE — Progress Notes (Signed)
[facility] Progress Note Patient Name: Joshua Massey DOB: 10-21-1975 MRN: [id number]   Date of Service  10/23/2019  HPI/Events of Note  Patient is intubated and ventilated - Patient is on a AC/HS moderate Novolog SSI.  eICU Interventions  Will order: 1. Change to Q 4 hour moderate Novolog SSI.     Intervention Category Major Interventions: Hyperglycemia - active titration of insulin therapy  Lysle Dingwall 10/23/2019, 9:21 PM

## 2019-10-23 NOTE — Procedures (Signed)
Intubation Procedure Note EDIS HUISH [id number] [birth year]  Procedure: Intubation Indications: Respiratory insufficiency  History: 44 year old man with cardiomyopathy admitted with acute renal failure and being managed in ICU/stepdown on CVVHD.  He was apparently interacting normally first thing this morning.  Was found by RN hypoxic, unresponsive, SPO2 in 64s.  He very briefly regained a sluggish mental status with bag mask ventilation but never fully recovered and adequate respiratory drive.  Decision made to intubate and support with MV.   Procedure Details Consent: Unable to obtain consent because of emergent medical necessity. Time Out: Verified patient identification, verified procedure, site/side was marked, verified correct patient position, special equipment/implants available, medications/allergies/relevent history reviewed, required imaging and test results available.  Performed  Maximum sterile technique was used including cap, gloves, gown, hand hygiene and mask.   MAc-4 glidescope  Medications: Etomidate 40 mg, Versed 2 mg  7.5 ET tube placed without difficulty on first pass using MAC 4 glide scope.  Position confirmed by direct visualization, end-tidal CO2, auscultation.    Evaluation Hemodynamic Status: BP stable throughout; O2 sats: Low throughout the procedure, 70s Patient's Current Condition: stable Complications: No apparent complications Patient did tolerate procedure well. Chest X-ray ordered to verify placement.  CXR: pending.   Collene Gobble 10/23/2019

## 2019-10-23 NOTE — Progress Notes (Signed)
[facility] for IV Heparin Indication: atrial fibrillation  No Known Allergies  Patient Measurements: Height: 5\' 11"  (180.3 cm) Weight: (!) 151.6 kg (334 lb 3.5 oz) IBW/kg (Calculated) : 75.3 Heparin Dosing Weight: 106 kg  Vital Signs: Temp: 97.6 F (36.4 C) (05/05 0742) Temp Source: Oral (05/05 0742) BP: 108/79 (05/05 0630) Pulse Rate: 61 (05/05 0700)  Labs: Recent Labs    10/20/19 1541 10/20/19 2300 10/21/19 0227 10/21/19 0227 10/21/19 1521 10/22/19 0740 10/22/19 1626 10/23/19 0614  HGB  --   --  9.6*  --   --  9.8*  --   --   HCT  --   --  30.9*  --   --  33.2*  --   --   PLT  --   --  145*  --   --  139*  --   --   APTT  --  80* 86*  --   --  74*  --   --   HEPARINUNFRC  --   --  0.65  --   --  0.41  --  0.29*  CREATININE   < >  --  2.67*   < > 2.38* 2.24* 2.16*  --    < > = values in this interval not displayed.    Estimated Creatinine Clearance: 66.7 mL/min (A) (by C-G formula based on SCr of 2.16 mg/dL (H)).   Assessment: 44 yr old male presented with severe fluid overload and was found to have COVID. He is on apixaban PTA for hx a fib (last dose on 4/27@1407 ). Scr continues to increase from 4.81 to 5.48 (pt refusing HD). Pharmacy was consulted to transition pt from apixaban to IV heparin.  Heparin level this morning is slightly SUBtherapeutic (HL 0.29 << 0.41, goal of 0.3-0.7). Hgb/Hct stable - no bleeding noted at this time. Will increase the rate to keep within range and recheck with AM labs on 5/6.   Goal of Therapy:  Heparin level 0.3-0.7 units/ml aPTT 66-102 seconds Monitor platelets by anticoagulation protocol: Yes   Plan:  - Increase Heparin drip rate to 1400 units/hr (14 ml/hr) - Will continue to monitor for any signs/symptoms of bleeding and will follow up with heparin level in the a.m.   Thank you for allowing pharmacy to be a part of this patient's care.  Alycia Rossetti, PharmD, BCPS Clinical  Pharmacist Clinical phone for 10/23/2019: [phone] 10/23/2019 7:59 AM   **Pharmacist phone directory can now be found on [email or web address] ([email or web address]).  Listed under [facility].

## 2019-10-24 ENCOUNTER — Inpatient Hospital Stay (HOSPITAL_COMMUNITY): Payer: BC Managed Care – PPO

## 2019-10-24 DIAGNOSIS — J9600 Acute respiratory failure, unspecified whether with hypoxia or hypercapnia: Secondary | ICD-10-CM | POA: Insufficient documentation

## 2019-10-24 DIAGNOSIS — J80 Acute respiratory distress syndrome: Secondary | ICD-10-CM

## 2019-10-24 DIAGNOSIS — U071 COVID-19: Secondary | ICD-10-CM

## 2019-10-24 DIAGNOSIS — J9601 Acute respiratory failure with hypoxia: Secondary | ICD-10-CM | POA: Diagnosis present

## 2019-10-24 LAB — RENAL FUNCTION PANEL
Albumin: 2 g/dL — ABNORMAL LOW (ref 3.5–5.0)
Albumin: 1.7 g/dL — ABNORMAL LOW (ref 3.5–5.0)
Anion gap: 6 (ref 5–15)
Anion gap: 9 (ref 5–15)
BUN: 16 mg/dL (ref 6–20)
BUN: 16 mg/dL (ref 6–20)
CO2: 25 mmol/L (ref 22–32)
CO2: 26 mmol/L (ref 22–32)
Calcium: 7.4 mg/dL — ABNORMAL LOW (ref 8.9–10.3)
Calcium: 6.8 mg/dL — ABNORMAL LOW (ref 8.9–10.3)
Chloride: 107 mmol/L (ref 98–111)
Chloride: 102 mmol/L (ref 98–111)
Creatinine, Ser: 1.72 mg/dL — ABNORMAL HIGH (ref 0.61–1.24)
Creatinine, Ser: 1.78 mg/dL — ABNORMAL HIGH (ref 0.61–1.24)
GFR calc Af Amer: 56 mL/min — ABNORMAL LOW (ref >60)
GFR calc Af Amer: 53 mL/min — ABNORMAL LOW (ref >60)
GFR calc non Af Amer: 48 mL/min — ABNORMAL LOW (ref >60)
GFR calc non Af Amer: 46 mL/min — ABNORMAL LOW (ref >60)
Glucose, Bld: 83 mg/dL (ref 70–99)
Glucose, Bld: 113 mg/dL — ABNORMAL HIGH (ref 70–99)
Phosphorus: 1.3 mg/dL — ABNORMAL LOW (ref 2.5–4.6)
Phosphorus: 6.4 mg/dL — ABNORMAL HIGH (ref 2.5–4.6)
Potassium: 3.8 mmol/L (ref 3.5–5.1)
Potassium: 3.9 mmol/L (ref 3.5–5.1)
Sodium: 138 mmol/L (ref 135–145)
Sodium: 137 mmol/L (ref 135–145)

## 2019-10-24 LAB — POCT I-STAT 7, (LYTES, BLD GAS, ICA,H+H)
Acid-Base Excess: 1 mmol/L (ref 0.0–2.0)
Acid-Base Excess: 1 mmol/L (ref 0.0–2.0)
Bicarbonate: 24.3 mmol/L (ref 20.0–28.0)
Bicarbonate: 25.8 mmol/L (ref 20.0–28.0)
Calcium, Ion: 1.1 mmol/L — ABNORMAL LOW (ref 1.15–1.40)
Calcium, Ion: 1.12 mmol/L — ABNORMAL LOW (ref 1.15–1.40)
HCT: 34 % — ABNORMAL LOW (ref 39.0–52.0)
HCT: 35 % — ABNORMAL LOW (ref 39.0–52.0)
Hemoglobin: 11.6 g/dL — ABNORMAL LOW (ref 13.0–17.0)
Hemoglobin: 11.9 g/dL — ABNORMAL LOW (ref 13.0–17.0)
O2 Saturation: 86 %
O2 Saturation: 92 %
Patient temperature: 97
Patient temperature: 98.6
Potassium: 3.8 mmol/L (ref 3.5–5.1)
Potassium: 3.9 mmol/L (ref 3.5–5.1)
Sodium: 138 mmol/L (ref 135–145)
Sodium: 139 mmol/L (ref 135–145)
TCO2: 25 mmol/L (ref 22–32)
TCO2: 27 mmol/L (ref 22–32)
pCO2 arterial: 32.6 mmHg (ref 32.0–48.0)
pCO2 arterial: 42 mmHg (ref 32.0–48.0)
pH, Arterial: 7.478 — ABNORMAL HIGH (ref 7.350–7.450)
pH, Arterial: 7.397 (ref 7.350–7.450)
pO2, Arterial: 45 mmHg — ABNORMAL LOW (ref 83.0–108.0)
pO2, Arterial: 64 mmHg — ABNORMAL LOW (ref 83.0–108.0)

## 2019-10-24 LAB — GLUCOSE, CAPILLARY
Glucose-Capillary: 109 mg/dL — ABNORMAL HIGH (ref 70–99)
Glucose-Capillary: 66 mg/dL — ABNORMAL LOW (ref 70–99)
Glucose-Capillary: 73 mg/dL (ref 70–99)
Glucose-Capillary: 74 mg/dL (ref 70–99)
Glucose-Capillary: 79 mg/dL (ref 70–99)
Glucose-Capillary: 80 mg/dL (ref 70–99)
Glucose-Capillary: 82 mg/dL (ref 70–99)

## 2019-10-24 LAB — CBC
HCT: 33.8 % — ABNORMAL LOW (ref 39.0–52.0)
Hemoglobin: 10.4 g/dL — ABNORMAL LOW (ref 13.0–17.0)
MCH: 29.8 pg (ref 26.0–34.0)
MCHC: 30.8 g/dL (ref 30.0–36.0)
MCV: 96.8 fL (ref 80.0–100.0)
Platelets: 174 10*3/uL (ref 150–400)
RBC: 3.49 MIL/uL — ABNORMAL LOW (ref 4.22–5.81)
RDW: 16.2 % — ABNORMAL HIGH (ref 11.5–15.5)
WBC: 6.8 10*3/uL (ref 4.0–10.5)
nRBC: 0.6 % — ABNORMAL HIGH (ref 0.0–0.2)

## 2019-10-24 LAB — HEPARIN LEVEL (UNFRACTIONATED): Heparin Unfractionated: 0.17 IU/mL — ABNORMAL LOW (ref 0.30–0.70)

## 2019-10-24 LAB — HEPATIC FUNCTION PANEL
ALT: 12 U/L (ref 0–44)
AST: 18 U/L (ref 15–41)
Albumin: 2 g/dL — ABNORMAL LOW (ref 3.5–5.0)
Alkaline Phosphatase: 70 U/L (ref 38–126)
Bilirubin, Direct: 0.7 mg/dL — ABNORMAL HIGH (ref 0.0–0.2)
Indirect Bilirubin: 0.8 mg/dL (ref 0.3–0.9)
Total Bilirubin: 1.5 mg/dL — ABNORMAL HIGH (ref 0.3–1.2)
Total Protein: 6.2 g/dL — ABNORMAL LOW (ref 6.5–8.1)

## 2019-10-24 LAB — PROCALCITONIN: Procalcitonin: 0.25 ng/mL

## 2019-10-24 MED ORDER — SODIUM PHOSPHATES 45 MMOLE/15ML IV SOLN
45.0000 mmol | Freq: Once | INTRAVENOUS | Status: AC
  Administered 2019-10-24: 45 mmol via INTRAVENOUS
  Filled 2019-10-24 (×3): qty 15

## 2019-10-24 MED ORDER — FENTANYL CITRATE (PF) 100 MCG/2ML IJ SOLN
50.0000 ug | Freq: Once | INTRAMUSCULAR | Status: DC
Start: 2019-10-24 — End: 2019-10-24

## 2019-10-24 MED ORDER — B COMPLEX-C PO TABS
1.0000 | ORAL_TABLET | Freq: Every day | ORAL | Status: DC
  Administered 2019-10-24 – 2019-10-30 (×7): 1
  Filled 2019-10-24 (×7): qty 1

## 2019-10-24 MED ORDER — MIDAZOLAM 50MG/50ML (1MG/ML) PREMIX INFUSION: 2.0000 mg/h | INTRAVENOUS | Status: DC

## 2019-10-24 MED ORDER — PRO-STAT SUGAR FREE PO LIQD
30.0000 mL | Freq: Every day | ORAL | Status: DC
  Administered 2019-10-24: 30 mL via ORAL
  Filled 2019-10-24: qty 30

## 2019-10-24 MED ORDER — VECURONIUM BROMIDE 10 MG IV SOLR
0.0000 ug/kg/min | INTRAVENOUS | Status: DC
Start: 2019-10-24 — End: 2019-10-24

## 2019-10-24 MED ORDER — FENTANYL 2500MCG IN NS 250ML (10MCG/ML) PREMIX INFUSION
50.0000 ug/h | INTRAVENOUS | Status: DC
  Administered 2019-10-24: 300 ug/h via INTRAVENOUS
  Administered 2019-10-24: 22:00:00 250 ug/h via INTRAVENOUS
  Administered 2019-10-24 – 2019-10-25 (×2): 300 ug/h via INTRAVENOUS
  Administered 2019-10-25: 250 ug/h via INTRAVENOUS
  Administered 2019-10-26 – 2019-10-31 (×15): 300 ug/h via INTRAVENOUS
  Filled 2019-10-24 (×19): qty 250

## 2019-10-24 MED ORDER — "THROMBI-PAD 3""X3"" EX PADS"
1.0000 | MEDICATED_PAD | Freq: Once | CUTANEOUS | Status: DC | PRN
  Filled 2019-10-24: qty 1

## 2019-10-24 MED ORDER — NOREPINEPHRINE 4 MG/250ML-% IV SOLN
0.0000 ug/min | INTRAVENOUS | Status: DC
  Administered 2019-10-24: 1 ug/min via INTRAVENOUS
  Administered 2019-10-25: 10 ug/min via INTRAVENOUS
  Filled 2019-10-24 (×3): qty 250
  Filled 2019-10-24: qty 500

## 2019-10-24 MED ORDER — CISATRACURIUM BOLUS VIA INFUSION
0.0500 mg/kg | Freq: Once | INTRAVENOUS | Status: AC
  Administered 2019-10-24: 7.6 mg via INTRAVENOUS
  Filled 2019-10-24: qty 8

## 2019-10-24 MED ORDER — SODIUM CHLORIDE 0.9 % IV SOLN
0.0000 ug/kg/min | INTRAVENOUS | Status: DC
  Administered 2019-10-24 (×2): 2 ug/kg/min via INTRAVENOUS
  Administered 2019-10-25: 2.5 ug/kg/min via INTRAVENOUS
  Administered 2019-10-25 – 2019-10-27 (×6): 3.5 ug/kg/min via INTRAVENOUS
  Filled 2019-10-24 (×12): qty 20

## 2019-10-24 MED ORDER — MIDAZOLAM BOLUS VIA INFUSION
1.0000 mg | INTRAVENOUS | Status: DC | PRN
  Administered 2019-10-28: 1 mg via INTRAVENOUS
  Administered 2019-10-29 (×2): 2 mg via INTRAVENOUS
  Filled 2019-10-24: qty 2

## 2019-10-24 MED ORDER — MIDAZOLAM BOLUS VIA INFUSION: 1.0000 mg | INTRAVENOUS | Status: DC | PRN

## 2019-10-24 MED ORDER — PRO-STAT SUGAR FREE PO LIQD
60.0000 mL | Freq: Three times a day (TID) | ORAL | Status: DC
  Administered 2019-10-24 – 2019-10-30 (×17): 60 mL
  Filled 2019-10-24 (×17): qty 60

## 2019-10-24 MED ORDER — FENTANYL BOLUS VIA INFUSION: 50.0000 ug | INTRAVENOUS | Status: DC | PRN

## 2019-10-24 MED ORDER — FENTANYL BOLUS VIA INFUSION
50.0000 ug | INTRAVENOUS | Status: DC | PRN
  Administered 2019-10-29 – 2019-10-31 (×3): 50 ug via INTRAVENOUS
  Filled 2019-10-24: qty 50

## 2019-10-24 MED ORDER — FENTANYL CITRATE (PF) 100 MCG/2ML IJ SOLN
50.0000 ug | Freq: Once | INTRAMUSCULAR | Status: AC
  Administered 2019-10-24: 50 ug via INTRAVENOUS

## 2019-10-24 MED ORDER — MIDAZOLAM 50MG/50ML (1MG/ML) PREMIX INFUSION
2.0000 mg/h | INTRAVENOUS | Status: DC
  Administered 2019-10-24 (×2): 8 mg/h via INTRAVENOUS
  Administered 2019-10-24 – 2019-10-25 (×2): 2 mg/h via INTRAVENOUS
  Administered 2019-10-26 – 2019-10-27 (×3): 4 mg/h via INTRAVENOUS
  Administered 2019-10-27 – 2019-10-28 (×4): 6 mg/h via INTRAVENOUS
  Administered 2019-10-28: 7 mg/h via INTRAVENOUS
  Administered 2019-10-29: 8 mg/h via INTRAVENOUS
  Administered 2019-10-29: 10 mg/h via INTRAVENOUS
  Administered 2019-10-29: 8 mg/h via INTRAVENOUS
  Administered 2019-10-29 – 2019-10-30 (×6): 10 mg/h via INTRAVENOUS
  Administered 2019-10-30: 9 mg/h via INTRAVENOUS
  Administered 2019-10-31: 10 mg/h via INTRAVENOUS
  Filled 2019-10-24 (×22): qty 50

## 2019-10-24 MED ORDER — SODIUM PHOSPHATES 45 MMOLE/15ML IV SOLN: 30.0000 mmol | Freq: Once | INTRAVENOUS | Status: DC

## 2019-10-24 MED ORDER — TOCILIZUMAB 400 MG/20ML IV SOLN
800.0000 mg | Freq: Once | INTRAVENOUS | Status: AC
  Administered 2019-10-24: 800 mg via INTRAVENOUS
  Filled 2019-10-24: qty 40

## 2019-10-24 MED ORDER — VITAL 1.5 CAL PO LIQD
1000.0000 mL | ORAL | Status: DC
  Administered 2019-10-24 – 2019-10-28 (×3): 1000 mL
  Filled 2019-10-24 (×10): qty 1000

## 2019-10-24 MED ORDER — VECURONIUM BROMIDE 10 MG IV SOLR: 0.8000 ug/kg/min | INTRAVENOUS | Status: DC

## 2019-10-24 MED ORDER — ARTIFICIAL TEARS OPHTHALMIC OINT
1.0000 "application " | TOPICAL_OINTMENT | Freq: Three times a day (TID) | OPHTHALMIC | Status: DC
  Administered 2019-10-24 – 2019-10-31 (×20): 1 via OPHTHALMIC
  Filled 2019-10-24 (×5): qty 3.5

## 2019-10-24 MED ORDER — FENTANYL 2500MCG IN NS 250ML (10MCG/ML) PREMIX INFUSION
50.0000 ug/h | INTRAVENOUS | Status: DC
Start: 2019-10-24 — End: 2019-10-24

## 2019-10-24 MED ORDER — GLUCOSE 40 % PO GEL
1.0000 | Freq: Once | ORAL | Status: AC
  Filled 2019-10-24: qty 1

## 2019-10-24 NOTE — Progress Notes (Signed)
Pt proned at this time with no complications. Pt stable throughout. VS within normal limits. ETT remains securely taped and in proper position.

## 2019-10-24 NOTE — Progress Notes (Signed)
Initial Nutrition Assessment  DOCUMENTATION CODES:   Morbid obesity  INTERVENTION:   Tube Feeding via OG: TF to continue while in prone position Vital 1.5 at 50 ml/hr Pro-Stat 60 mL TID Provides 2400 kcals, 171 g of protein and 912 mL of free water Meets 100% estimated calorie and protein needs  Add B-complex with C   NUTRITION DIAGNOSIS:   Inadequate oral intake related to acute illness as evidenced by NPO status.  GOAL:   Patient will meet greater than or equal to 90% of their needs  MONITOR:   Vent status, Labs, Weight trends, TF tolerance, Skin  REASON FOR ASSESSMENT:   Consult Assessment of nutrition requirement/status  ASSESSMENT:   44 yo male admitted with acute respiratory failure related to bilateral pulmonary infiltrates and CHF, possible COVID pneumonits as well, acute encephalopathy,  Acute on chronic renal failure and acute on chronic CHF. PMH includes cardiomyopathy, CHF, CKD III, DM, GERD, HTN  4/27 Admitted 4/30 CRRT initiated 5/05 Intubated 5/06 Prone position  Pt placed in prone position today CRRT continues with current UF of 50-100 ml/hr  Patient is currently intubated on ventilator support, sedated on fentanyl and versed MV: 9 L/min Temp (24hrs), Avg:96.5 F (35.8 C), Min:96.3 F (35.7 C), Max:96.9 F (36.1 C)  Limited documentation of po intake prior; 0-30%  OG tube with tip in stomach Jevity 1.2 started at 20 ml/hr last night, increased today to 40 ml/hr due to hypoglycemia. Off currently as getting ready to prone. Plan to resume TF post   Current wt 151.6 kg; admit weight 165.6 kg. Net negative 14.6 L per I/O flow sheet  Labs: CBGS 66-74, Creatinine 1.72, phosphorus 1.3 (L) Meds: ss novolog, MVI with Minerals, sodium phosphate   NUTRITION - FOCUSED PHYSICAL EXAM:    Most Recent Value  Orbital Region  No depletion  Upper Arm Region  No depletion  Thoracic and Lumbar Region  No depletion  Buccal Region  Unable to assess   Temple Region  No depletion  Clavicle Bone Region  No depletion  Clavicle and Acromion Bone Region  No depletion  Scapular Bone Region  No depletion  Dorsal Hand  Unable to assess  Patellar Region  No depletion  Anterior Thigh Region  No depletion  Posterior Calf Region  No depletion  Edema (RD Assessment)  Moderate       Diet Order:   Diet Order            Diet NPO time specified  Diet effective now              EDUCATION NEEDS:   Not appropriate for education at this time  Skin:  Skin Assessment: Skin Integrity Issues: Skin Integrity Issues:: Other (Comment) Other: venous stasis ulcers b/l LE  Last BM:  5/6  Height:   Ht Readings from Last 1 Encounters:  10/15/19 5\' 11"  (1.803 m)    Weight:   Wt Readings from Last 1 Encounters:  10/24/19 (!) 151.6 kg    Ideal Body Weight:  (Adjusted BW 106 kg; IBW 78.2 kg)  BMI:  Body mass index is 46.61 kg/m.  Estimated Nutritional Needs:   Kcal:  9983-3825 kcals  Protein:  156-195 g  Fluid:  >/= 2 L   Kerman Passey MS, RDN, LDN, CNSC RD Pager Number and RD On-Call Pager Number Located in [place]

## 2019-10-24 NOTE — Progress Notes (Signed)
[facility] Progress Note Patient Name: Joshua Massey DOB: [birth year] MRN: [id number]   Date of Service  10/24/2019  HPI/Events of Note  ABG on 80%/PRVC 28/TV 600/P 12 = 7.478/32.6/45. CXR pending for 5 AM. Sat currently = 98% by oximeter.  eICU Interventions  Will order: 1. Decrease PRVC rate to 21. 2. Increase PEEP to 14. 3. Repeat ABG at 6 AM.     Intervention Category Major Interventions: Acid-Base disturbance - evaluation and management;Hypoxemia - evaluation and management;Respiratory failure - evaluation and management  Joshua Massey [name] 10/24/2019, 4:13 AM

## 2019-10-24 NOTE — Progress Notes (Signed)
PT Cancellation Note  Patient Details Name: Joshua Massey MRN: [id number] DOB: 1975/10/14   Cancelled Treatment:    Reason Eval/Treat Not Completed: Medical issues which prohibited therapy.  Pt unable to tolerate therapy at this time. 10/24/2019  Ginger Carne., PT Acute Rehabilitation Services [phone]  (pager) [phone]  (office)   Tessie Fass Maxi Rodas 10/24/2019, 10:04 AM

## 2019-10-24 NOTE — Progress Notes (Signed)
Patient ID: Joshua Massey, male   DOB: 06/06/1976, 44 y.o.   MRN: [id number] S: REmains sedated on vent O:BP (!) 135/100   Pulse (!) 51   Temp (!) 96.3 F (35.7 C) (Axillary)   Resp (!) 9   Ht 5' 11"  (1.803 m)   Wt (!) 151.6 kg   SpO2 100%   BMI 46.61 kg/m   Intake/Output Summary (Last 24 hours) at 10/24/2019 0921 Last data filed at 10/24/2019 0800 Gross per 24 hour  Intake 1066.3 ml  Output 3156 ml  Net -2089.7 ml   Intake/Output: I/O last 3 completed shifts: In: 1126.7 [I.V.:721.8; NG/GT:240; IV Piggyback:164.9] Out: 1856 [Urine:142; Emesis/NG output:330; DJSHF:0263]  Intake/Output this shift:  Total I/O In: 124 [I.V.:44; NG/GT:80] Out: 198 [Other:198] Weight change: 0 kg Physical exam: unable to complete due to COVID + status.  In order to preserve PPE equipment and to minimize exposure to providers.  Notes from other caregivers reviewed   Recent Labs  Lab 10/18/19 2200 10/19/19 1100 10/21/19 0227 10/21/19 0227 10/21/19 1521 10/21/19 1521 10/22/19 0740 10/22/19 0740 10/22/19 1626 10/22/19 1626 10/23/19 7858 10/23/19 8502 10/23/19 0855 10/23/19 1000 10/23/19 1011 10/23/19 1630 10/24/19 0352 10/24/19 0454 10/24/19 0634  NA 140   < > 140   < > 137   < > 135   < > 137   < > 137   < > 138 138 138 137 138 138 139  K 5.1   < > 4.7   < > 4.4   < > 4.4   < > 4.5   < > 4.3   < > 4.2 4.3 4.2 4.0 3.8 3.8 3.9  CL 109   < > 106  --  104  --  103  --  106  --  104  --   --   --   --  104  --  107  --   CO2 18*   < > 26  --  26  --  26  --  27  --  27  --   --   --   --  23  --  25  --   GLUCOSE 130*   < > 140*  --  124*  --  95  --  96  --  89  --   --   --   --  78  --  83  --   BUN 102*   < > 46*  --  38*  --  29*  --  26*  --  21*  --   --   --   --  21*  --  16  --   CREATININE 4.98*   < > 2.67*  --  2.38*  --  2.24*  --  2.16*  --  2.09*  --   --   --   --  2.12*  --  1.72*  --   ALBUMIN 2.8*   < > 2.5*  --  2.5*  --  2.4*  --  2.4*  --  2.3*  --   --   --   --  2.1*   --  2.0*  2.0*  --   CALCIUM 7.8*   < > 7.7*  --  7.7*  --  7.6*  --  7.5*  --  7.7*  --   --   --   --  7.5*  --  7.4*  --   PHOS  --    < >  2.6  --  2.3*  --  2.2*  --  2.7  --  2.4*  --   --   --   --  2.3*  --  1.3*  --   AST 31  --   --   --   --   --   --   --   --   --   --   --   --   --   --   --   --  18  --   ALT 13  --   --   --   --   --   --   --   --   --   --   --   --   --   --   --   --  12  --    < > = values in this interval not displayed.   Liver Function Tests: Recent Labs  Lab 10/18/19 2200 10/19/19 1100 10/23/19 0614 10/23/19 1630 10/24/19 0454  AST 31  --   --   --  18  ALT 13  --   --   --  12  ALKPHOS 87  --   --   --  70  BILITOT 1.5*  --   --   --  1.5*  PROT 7.0  --   --   --  6.2*  ALBUMIN 2.8*   < > 2.3* 2.1* 2.0*  2.0*   < > = values in this interval not displayed.   No results for input(s): LIPASE, AMYLASE in the last 168 hours. Recent Labs  Lab 10/18/19 2134  AMMONIA 53*   CBC: Recent Labs  Lab 10/20/19 0500 10/20/19 0500 10/21/19 0227 10/21/19 0227 10/22/19 0740 10/22/19 0740 10/23/19 0973 10/23/19 0855 10/24/19 0352 10/24/19 0454 10/24/19 0634  WBC 6.1   < > 6.9   < > 6.5  --  7.4  --   --  6.8  --   HGB 9.7*   < > 9.6*   < > 9.8*   < > 9.8*   < > 11.6* 10.4* 11.9*  HCT 30.5*   < > 30.9*   < > 33.2*   < > 33.2*   < > 34.0* 33.8* 35.0*  MCV 94.4  --  95.4  --  99.7  --  101.8*  --   --  96.8  --   PLT 155   < > 145*   < > 139*  --  156  --   --  174  --    < > = values in this interval not displayed.   Cardiac Enzymes: No results for input(s): CKTOTAL, CKMB, CKMBINDEX, TROPONINI in the last 168 hours. CBG: Recent Labs  Lab 10/23/19 2039 10/23/19 2116 10/24/19 0046 10/24/19 0311 10/24/19 0748  GLUCAP 66* 100* 73 80 66*    Iron Studies: No results for input(s): IRON, TIBC, TRANSFERRIN, FERRITIN in the last 72 hours. Studies/Results: CT HEAD WO CONTRAST  Result Date: 10/23/2019 CLINICAL DATA:  Encephalopathy,  lethargy. On heparin. EXAM: CT HEAD WITHOUT CONTRAST TECHNIQUE: Contiguous axial images were obtained from the base of the skull through the vertex without intravenous contrast. COMPARISON:  None. FINDINGS: Brain: Ventricles are within normal limits in size. There is no mass, hemorrhage, edema or other evidence of acute parenchymal abnormality. No extra-axial hemorrhage. Vascular: Chronic calcified atherosclerotic changes of the large vessels at the skull base. No unexpected hyperdense vessel. Skull: Normal.  Negative for fracture or focal lesion. Sinuses/Orbits: Scattered mucosal thickening within the ethmoid air cells and sphenoid sinus, of uncertain chronicity. Periorbital and retro-orbital soft tissues are unremarkable. Other: None. IMPRESSION: 1. No acute findings. No intracranial mass, hemorrhage or edema. 2. Paranasal sinus disease, of uncertain chronicity. Electronically Signed   By: Franki Cabot M.D.   On: 10/23/2019 13:52   DG Chest Port 1 View  Result Date: 10/24/2019 CLINICAL DATA:  Covid-19 positive. Intubated on 10/23/2019. EXAM: PORTABLE CHEST 1 VIEW COMPARISON:  10/23/2019 FINDINGS: Endotracheal tube is in place, tip approximately 2.5 centimeters above the carina. Nasogastric tube is in place, tip off the film beyond the gastroesophageal junction. RIGHT IJ central line tip overlies the superior vena cava. Stable cardiomegaly. Patchy opacity in the LEFT lung appears stable. More confluent opacity in the RIGHT lung, particularly within the RIGHT UPPER lobe and RIGHT mid lung zone, appears stable. IMPRESSION: 1. No significant change in bilateral airspace filling opacities. 2. Stable cardiomegaly. Electronically Signed   By: Nolon Nations M.D.   On: 10/24/2019 08:04   Portable Chest x-ray  Result Date: 10/23/2019 CLINICAL DATA:  Intubated EXAM: PORTABLE CHEST 1 VIEW COMPARISON:  10/18/2019 chest radiograph. FINDINGS: Endotracheal tube tip is 2.8 cm above the carina. Enteric tube enters stomach  with the tip not seen on this image. Right internal jugular central venous catheter terminates over cavoatrial junction. Stable cardiomediastinal silhouette with moderate cardiomegaly. No pneumothorax. No pleural effusion. Low lung volumes. Extensive patchy opacities throughout the right greater than left lungs, slightly worsened on the right. IMPRESSION: 1. Well-positioned support structures. No pneumothorax. 2. Extensive patchy opacities throughout the right greater than left lungs, slightly worsened on the right, compatible with multifocal pneumonia versus asymmetric severe pulmonary edema given the moderate cardiomegaly. Electronically Signed   By: Ilona Sorrel M.D.   On: 10/23/2019 10:06   . amiodarone  200 mg Per Tube Daily  . atorvastatin  10 mg Per Tube Q supper  . carvedilol  12.5 mg Per Tube BID WC  . chlorhexidine gluconate (MEDLINE KIT)  15 mL Mouth Rinse BID  . Chlorhexidine Gluconate Cloth  6 each Topical Daily  . dextrose  1 Tube Oral Once  . docusate  100 mg Per Tube BID  . fentaNYL (SUBLIMAZE) injection  50 mcg Intravenous Once  . ferrous sulfate  300 mg Per Tube BID WC  . insulin aspart  0-15 Units Subcutaneous Q4H  . isosorbide-hydrALAZINE  1 tablet Per Tube BID  . mouth rinse  15 mL Mouth Rinse 10 times per day  . multivitamin with minerals  1 tablet Per Tube Daily  . pantoprazole (PROTONIX) IV  40 mg Intravenous QHS  . polyethylene glycol  17 g Per Tube Daily  . sodium chloride flush  3 mL Intravenous Q12H    BMET    Component Value Date/Time   NA 139 10/24/2019 0634   NA 142 09/12/2016 0000   K 3.9 10/24/2019 0634   CL 107 10/24/2019 0454   CO2 25 10/24/2019 0454   GLUCOSE 83 10/24/2019 0454   BUN 16 10/24/2019 0454   BUN 58 (A) 09/12/2016 0000   CREATININE 1.72 (H) 10/24/2019 0454   CREATININE 2.63 (H) 12/20/2016 1613   CALCIUM 7.4 (L) 10/24/2019 0454   GFRNONAA 48 (L) 10/24/2019 0454   GFRAA 56 (L) 10/24/2019 0454   CBC    Component Value Date/Time    WBC 6.8 10/24/2019 0454   RBC 3.49 (L) 10/24/2019 0454   HGB 11.9 (  L) 10/24/2019 0634   HCT 35.0 (L) 10/24/2019 0634   PLT 174 10/24/2019 0454   MCV 96.8 10/24/2019 0454   MCH 29.8 10/24/2019 0454   MCHC 30.8 10/24/2019 0454   RDW 16.2 (H) 10/24/2019 0454   LYMPHSABS 0.5 (L) 11/27/2017 1422   MONOABS 0.7 11/27/2017 1422   EOSABS 0.1 11/27/2017 1422   BASOSABS 0.0 11/27/2017 1422    Assessment/Plan: 1. Respiratory arrest/AMS- s/p intubated 5/5 and sent for head CT that was without acute findings except paranasal sinus disease. 2. 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 He has had several episodes over the past year similar to this and usually due to noncompliance with his diuretics and follow up. He was started on a lasix drip on 10/15/19 without much improvement despite multiple doses of metolazone. 1. Started on CRRT 10/18/19 with marked improvement of volume and electrolytes. 2. 4K/2.5Ca for all fluids. 3. Goal UF 150 ml/hr but due to dropping BP'sanddecreasedgoal to 50-100 ml/hr 10/21/19. 4. Non-tunneled HD catheter placed 10/18/19 5. Continue with CRRT  3. Acute on chronic combined diastolic and systolic CHF- due to noncompliance with diuretics. Significant anasarca and no response to lasix drip. Echo with EF 60-10% EF, RV systolic function severely reduced and grade III DD (restrictive). Plan as outlined above. 4. Acute hypoxic respiratory failure due to #2 and covid-19 Pneumonia-now on non-rebreather. Treatment per primary svc 5. Hypophosphatemia- due to CRRT. Will replete IV again and follow.  6. Left charcot join with plantar ulcer- followed by Dr. Sharol Given 7. P. Atrial fib- on amio per Cardiology. 8. Anemia of CKD -started on Aranesp 40 mcg on 10/18/19. Continue to follow. 9. Medical noncompliance- ongoing issue, poor health literacy and insight. 10. Disposition- poor overall prognosis.  Not certain he would tolerate intermittent HD given biventricular heart failure and low bp's. He declined starting dialysisinitiallyand is not sure he would agree to long term dialysis.Recommend Palliative Careconsult to help set goals/limits of care.   Donetta Potts, MD [employer] [phone]

## 2019-10-24 NOTE — Progress Notes (Signed)
[place] for IV Heparin Indication: atrial fibrillation  No Known Allergies  Patient Measurements: Height: 5\' 11"  (180.3 cm) Weight: (!) 151.6 kg (334 lb 3.5 oz) IBW/kg (Calculated) : 75.3 Heparin Dosing Weight: 106 kg  Vital Signs: Temp: 96.3 F (35.7 C) (05/06 0400) Temp Source: Axillary (05/06 0400) BP: 122/93 (05/06 0645) Pulse Rate: 54 (05/06 0700)  Labs: Recent Labs    10/22/19 0740 10/22/19 1626 10/23/19 0614 10/23/19 0855 10/23/19 1011 10/23/19 1017 10/23/19 1630 10/24/19 0352 10/24/19 0352 10/24/19 0454 10/24/19 0634  HGB 9.8*  --  9.8*   < >   < >  --   --  11.6*   < > 10.4* 11.9*  HCT 33.2*  --  33.2*   < >   < >  --   --  34.0*  --  33.8* 35.0*  PLT 139*  --  156  --   --   --   --   --   --  174  --   APTT 74*  --   --   --   --   --   --   --   --   --   --   HEPARINUNFRC 0.41  --  0.29*  --   --   --   --   --   --  0.17*  --   CREATININE 2.24*   < > 2.09*  --   --   --  2.12*  --   --  1.72*  --   TROPONINIHS  --   --   --   --   --  17 17  --   --   --   --    < > = values in this interval not displayed.    Estimated Creatinine Clearance: 83.7 mL/min (A) (by C-G formula based on SCr of 1.72 mg/dL (H)).   Assessment: 44 yr old male presented with severe fluid overload and was found to have COVID. He is on apixaban PTA for hx a fib (last dose on 4/27@1407 ). Scr continues to increase from 4.81 to 5.48 (pt refusing HD). Pharmacy was consulted to transition pt from apixaban to IV heparin.  Heparin level this morning is SUBtherapeutic (HL 0.17 << 0.29, goal of 0.3-0.7). Hgb/Hct stable - no bleeding noted at this time.   Goal of Therapy:  Heparin level 0.3-0.7 units/ml aPTT 66-102 seconds Monitor platelets by anticoagulation protocol: Yes   Plan:  - Increase Heparin drip rate to 1750 units/hr (17.5 ml/hr) - Will continue to monitor for any signs/symptoms of bleeding and will follow up with heparin level in 8  hours  Thank you for allowing pharmacy to be a part of this patient's care.  Alycia Rossetti, PharmD, BCPS Clinical Pharmacist Clinical phone for 10/24/2019: [phone] 10/24/2019 7:19 AM   **Pharmacist phone directory can now be found on [email or web address] ([email or web address]).  Listed under [place].

## 2019-10-24 NOTE — Procedures (Signed)
Central Venous Catheter Insertion Procedure Note Joshua Massey [id number] 11-15-1975  Procedure: Insertion of Central Venous Catheter Indications: Assessment of intravascular volume and Drug and/or fluid administration  Procedure Details Consent: Risks of procedure as well as the alternatives and risks of each were explained to the (patient/caregiver).  Consent for procedure obtained. Time Out: Verified patient identification, verified procedure, site/side was marked, verified correct patient position, special equipment/implants available, medications/allergies/relevent history reviewed, required imaging and test results available.  Performed  Maximum sterile technique was used including antiseptics, cap, gloves, gown, hand hygiene, mask and sheet. Skin prep: Chlorhexidine; local anesthetic administered A antimicrobial bonded/coated triple lumen catheter was placed in the left internal jugular vein using the Seldinger technique.  Evaluation Blood flow good Complications: No apparent complications Patient did tolerate procedure well. Chest X-ray ordered to verify placement.  CXR: pending.  [name] MD [facility] Please see [email or web address] for pager details.  10/24/2019, 11:59 AM

## 2019-10-24 NOTE — Progress Notes (Signed)
[facility] Progress Note Patient Name: Joshua Massey DOB: [birth year] MRN: [id number]   Date of Service  10/24/2019  HPI/Events of Note  Hypotension - BP = 88/52 with MAP = 63. Patient on CRRT.  eICU Interventions  Will order: 1. Norepinephrine IV infusion. Titrate to MAP >=65.      Intervention Category Major Interventions: Hypotension - evaluation and management  Agatha Duplechain [name] 10/24/2019, 9:17 PM

## 2019-10-24 NOTE — Progress Notes (Signed)
Pt in need of central line placement. Attempted to contact mother, but unable. Have called pt's brother, updated and made him aware of need for central line and witnessed that he is informed of possible adverse events w/ placement. Clarice Pole reached father to affirm parent's consent for placement and gives consent by phone as witnessed by 2nd RN.

## 2019-10-24 NOTE — Progress Notes (Signed)
RT NOTE:  Pt in prone position. Pt's head turned to the right without complication. ETT secured throughout. Arms rotated.

## 2019-10-24 NOTE — Progress Notes (Addendum)
NAME:  Joshua Massey, MRN:  [id number], DOB:  1975/10/07, LOS: 9 ADMISSION DATE:  10/16/2019, CONSULTATION DATE: 10/23/2019 REFERRING MD: Dr. Waldron Labs, CHIEF COMPLAINT: Acute respiratory failure  Brief History   44 year old man.  Acute renal failure and acute respiratory failure due to acute on chronic systolic and diastolic CHF, medical noncompliance.  COVID-19 positive, consider impact of Covid pneumonitis.  CVVH started 4/30.  Intubated 5/5  History of present illness   44 year old man with known cardiomyopathy, atrial fibrillation, stage III kidney disease, obesity, admitted with dyspnea, massive volume overload and apparently off his diuretics for several weeks.  Also found to be incidentally positive for COVID-19.  Admitted for volume removal, transition to HD on 10/18/2019 and has been tolerating CVVH.  Became acutely unresponsive 5/5, was found without oxygen in place, hypoxic.  Intubated 5/5  Past Medical History   Past Medical History:  Diagnosis Date  . 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 Chronic kidney disease   . Diabetes mellitus    adult-onset, type 2  . Exogenous obesity   . GERD (gastroesophageal reflux disease)   . Hypertension   . Left foot infection 06/21/2018  . 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  No acute findings.   Micro Data:  SARS CoV2 4/27 >> positive Influenza A/B 4/27 >> negative MRSA screen 4/28 >> negative Blood 4/30 >>   Antimicrobials:  Remdesivir 4/29 >> 5/3  Interim history/subjective:   Remains on the ventilator, hypoxic on ABG  Objective   Blood pressure (!) 135/100, pulse (!) 51, temperature (!) 96.3 F (35.7 C), temperature source Axillary, resp. rate (!) 9, height 5\' 11"  (1.803 m), weight (!) 151.6 kg, SpO2 100 %.    Vent Mode: PRVC FiO2 (%):  [80 %-100 %] 100 % Set Rate:  [21 bmp-28 bmp] 21 bmp Vt Set:  [600 mL] 600 mL PEEP:  [5 cmH20-16 cmH20] 16 cmH20 Plateau Pressure:  [25 cmH20-33 cmH20] 32 cmH20   Intake/Output Summary (Last 24 hours) at 10/24/2019 0759 Last data filed at 10/24/2019 0700 Gross per 24 hour  Intake 968.26 ml  Output 3147 ml  Net -2178.74 ml   Filed Weights   10/22/19 0500 10/23/19 0419 10/24/19 0500  Weight: (!) 155.1 kg (!) 151.6 kg (!) 151.6 kg    Examination: Gen:      No acute distress, obese HEENT:  EOMI, sclera anicteric Neck:     No masses; no thyromegaly, ETT Lungs:    Clear to auscultation bilaterally; normal respiratory effort CV:         Regular rate and rhythm; no murmurs Abd:      + bowel sounds; soft, non-tender; no palpable masses, no distension Ext:   1-2+ edema; LE in bandages. Skin:      Warm and dry; no rash  Neuro: Sedated  Chest x-ray today shows worsening consolidation on the right, supportive factors and stable position Labs significant for BUN/creatinine 16/1.72, WBC 6.8   Resolved Hospital Problem list     Assessment & Plan:  Acute hypoxemic respiratory failure, suspect multifactorial in the setting of bilateral pulmonary infiltrates.  Contribution of combined systolic and diastolic acute CHF as well as possible Covid pneumonitis.    Given asymmetric lung consolidation and the fact that he is already I/O 14 L negative I suspect his acute  decompensation is more due to progressive COVID-19 pneumonia. Change vent settings.  6cc/kg low tidal volume ventilation Starting proning protocol today. We will give actemra given rising CRP as new evidence shows effectiveness within 24 hours of ICU/intubation. I called mother and family today and updated about situation.  Discussed off label use and risk/benefit of Actemra and they have agreed to this therapy. Low suspicion for superimposed infection as procalcitonin is low.  Acute hypoxemic encephalopathy.  Suspect due to his acute hypoxemia.  CT head is unremarkable Continue sedation protocol Lexapro held Continue Neurontin for now. Lexap-ro started this hospitalization given flat affect, possible depression.  DC now and follow -Neurontin for now, consider hold depending on course  Acute on chronic (stage III) renal failure -Appreciate nephrology management Continue volume removal -Note that given his medical noncompliance likely a poor candidate for long-term hemodialysis.  This will have to be sorted out as his acute issues stabilize  Acute on chronic systolic and diastolic CHF with severe volume overload.  Associated severe RV dysfunction and right heart failure Hypertension -Remains on carvedilol (dose decreased earlier this hospitalization) -Isosorbide/hydralazine as ordered  Atrial fibrillation On heparin infusion Continue amiodarone  Hyperglycemia Sliding-scale insulin as ordered  Hyperlipidemia Atorvastatin as ordered  Anemia of chronic disease Follow CBC  Left plantar ulcer, Charcot foot. -No signs of cellulitis or osteomyelitis by MRI -Appreciate Dr. Jess Barters assistance, plan to continue wound care and follow with him as an outpatient assuming no acute changes  Best practice:  Diet: NPO, Start tube feeds Pain/Anxiety/Delirium protocol (if indicated): Fentanyl infusion, Versed as needed VAP protocol (if indicated): In place DVT prophylaxis: Therapeutic  heparin GI prophylaxis: Protonix IV Glucose control: SSI Mobility:BR Code Status: Full Family Communication: Updated 5/6 Disposition: ICU  Labs   CBC: Recent Labs  Lab 10/20/19 0500 10/20/19 0500 10/21/19 0227 10/21/19 0227 10/22/19 0740 10/22/19 0740 10/23/19 2297 10/23/19 0855 10/23/19 1000 10/23/19 1011 10/24/19 0352 10/24/19 0454 10/24/19 0634  WBC 6.1  --  6.9  --  6.5  --  7.4  --   --   --   --  6.8  --   HGB 9.7*   < > 9.6*   < > 9.8*   < > 9.8*   < > 10.9* 11.9* 11.6* 10.4* 11.9*  HCT 30.5*   < > 30.9*   < > 33.2*   < > 33.2*   < > 32.0* 35.0* 34.0* 33.8* 35.0*  MCV 94.4  --  95.4  --  99.7  --  101.8*  --   --   --   --  96.8  --   PLT 155  --  145*  --  139*  --  156  --   --   --   --  174  --    < > = values in this interval not displayed.    Basic Metabolic Panel: Recent Labs  Lab 10/19/19 1100 10/19/19 1527 10/20/19 0500 10/20/19 1541 10/21/19  7262 10/21/19 1521 10/22/19 0740 10/22/19 0740 10/22/19 1626 10/22/19 1626 10/23/19 0355 10/23/19 0855 10/23/19 1011 10/23/19 1630 10/24/19 0352 10/24/19 0454 10/24/19 0634  NA 142   < > 138   < > 140   < > 135   < > 137   < > 137   < > 138 137 138 138 139  K 4.8   < > 4.6   < > 4.7   < > 4.4   < > 4.5   < > 4.3   < > 4.2 4.0 3.8 3.8 3.9  CL 108   < > 104   < > 106   < > 103  --  106  --  104  --   --  104  --  107  --   CO2 21*   < > 21*   < > 26   < > 26  --  27  --  27  --   --  23  --  25  --   GLUCOSE 114*   < > 147*   < > 140*   < > 95  --  96  --  89  --   --  78  --  83  --   BUN 85*   < > 62*   < > 46*   < > 29*  --  26*  --  21*  --   --  21*  --  16  --   CREATININE 4.09*   < > 3.22*   < > 2.67*   < > 2.24*  --  2.16*  --  2.09*  --   --  2.12*  --  1.72*  --   CALCIUM 7.9*   < > 7.8*   < > 7.7*   < > 7.6*  --  7.5*  --  7.7*  --   --  7.5*  --  7.4*  --   MG 2.4  --  2.4  --  2.4  --  2.3  --   --   --  2.4  --   --   --   --   --   --   PHOS 5.4*   < > 3.7   < > 2.6   < > 2.2*  --  2.7   --  2.4*  --   --  2.3*  --  1.3*  --    < > = values in this interval not displayed.   GFR: Estimated Creatinine Clearance: 83.7 mL/min (A) (by C-G formula based on SCr of 1.72 mg/dL (H)). Recent Labs  Lab 10/21/19 0227 10/21/19 0227 10/22/19 0740 10/23/19 0614 10/23/19 1005 10/24/19 0454  PROCALCITON 0.28   < > 0.24 0.31 0.33 0.25  WBC 6.9  --  6.5 7.4  --  6.8   < > = values in this interval not displayed.    Liver Function Tests: Recent Labs  Lab 10/18/19 2200 10/19/19 1100 10/22/19 0740 10/22/19 1626 10/23/19 0614 10/23/19 1630 10/24/19 0454  AST 31  --   --   --   --   --  18  ALT 13  --   --   --   --   --  12  ALKPHOS 87  --   --   --   --   --  70  BILITOT 1.5*  --   --   --   --   --  1.5*  PROT 7.0  --   --   --   --   --  6.2*  ALBUMIN 2.8*   < > 2.4* 2.4* 2.3* 2.1* 2.0*  2.0*   < > = values in this interval not displayed.   No results for input(s): LIPASE, AMYLASE in the last 168 hours. Recent Labs  Lab 10/18/19 2134  AMMONIA 53*    ABG    Component Value Date/Time   PHART 7.397 10/24/2019 0634   PCO2ART 42.0 10/24/2019 0634   PO2ART 64 (L) 10/24/2019 0634   HCO3 25.8 10/24/2019 0634   TCO2 27 10/24/2019 0634   ACIDBASEDEF 1.0 10/23/2019 0855   O2SAT 92.0 10/24/2019 0634     Coagulation Profile: No results for input(s): INR, PROTIME in the last 168 hours.  Cardiac Enzymes: No results for input(s): CKTOTAL, CKMB, CKMBINDEX, TROPONINI in the last 168 hours.  HbA1C: Hgb A1c MFr Bld  Date/Time Value Ref Range Status  10/15/2019 05:02 PM 6.3 (H) 4.8 - 5.6 % Final    Comment:    (NOTE) Pre diabetes:          5.7%-6.4% Diabetes:              >6.4% Glycemic control for   <7.0% adults with diabetes   05/21/2019 11:11 PM 6.8 (H) 4.8 - 5.6 % Final    Comment:    (NOTE)         Prediabetes: 5.7 - 6.4         Diabetes: >6.4         Glycemic control for adults with diabetes: <7.0     CBG: Recent Labs  Lab 10/23/19 2039 10/23/19 2116  10/24/19 0046 10/24/19 0311 10/24/19 0748  GLUCAP 66* 100* 73 80 66*    Critical care time:     The patient is critically ill with multiple organ system failure and requires high complexity decision making for assessment and support, frequent evaluation and titration of therapies, advanced monitoring, review of radiographic studies and interpretation of complex data.   Critical Care Time devoted to patient care services, exclusive of separately billable procedures, described in this note is 35 minutes.   [name] MD [facility] Please see [email or web address] for pager details.  10/24/2019, 8:00 AM

## 2019-10-25 ENCOUNTER — Inpatient Hospital Stay (HOSPITAL_COMMUNITY): Payer: BC Managed Care – PPO

## 2019-10-25 LAB — POCT I-STAT 7, (LYTES, BLD GAS, ICA,H+H)
Acid-Base Excess: 0 mmol/L (ref 0.0–2.0)
Bicarbonate: 27.1 mmol/L (ref 20.0–28.0)
Calcium, Ion: 1.12 mmol/L — ABNORMAL LOW (ref 1.15–1.40)
HCT: 33 % — ABNORMAL LOW (ref 39.0–52.0)
Hemoglobin: 11.2 g/dL — ABNORMAL LOW (ref 13.0–17.0)
O2 Saturation: 88 %
Patient temperature: 97.7
Potassium: 4.1 mmol/L (ref 3.5–5.1)
Sodium: 138 mmol/L (ref 135–145)
TCO2: 29 mmol/L (ref 22–32)
pCO2 arterial: 50.7 mmHg — ABNORMAL HIGH (ref 32.0–48.0)
pH, Arterial: 7.333 — ABNORMAL LOW (ref 7.350–7.450)
pO2, Arterial: 57 mmHg — ABNORMAL LOW (ref 83.0–108.0)

## 2019-10-25 LAB — BASIC METABOLIC PANEL
Anion gap: 4 — ABNORMAL LOW (ref 5–15)
BUN: 13 mg/dL (ref 6–20)
CO2: 27 mmol/L (ref 22–32)
Calcium: 6.8 mg/dL — ABNORMAL LOW (ref 8.9–10.3)
Chloride: 104 mmol/L (ref 98–111)
Creatinine, Ser: 1.64 mg/dL — ABNORMAL HIGH (ref 0.61–1.24)
GFR calc Af Amer: 59 mL/min — ABNORMAL LOW (ref >60)
GFR calc non Af Amer: 51 mL/min — ABNORMAL LOW (ref >60)
Glucose, Bld: 123 mg/dL — ABNORMAL HIGH (ref 70–99)
Potassium: 4.1 mmol/L (ref 3.5–5.1)
Sodium: 135 mmol/L (ref 135–145)

## 2019-10-25 LAB — CBC
HCT: 32.9 % — ABNORMAL LOW (ref 39.0–52.0)
Hemoglobin: 9.8 g/dL — ABNORMAL LOW (ref 13.0–17.0)
MCH: 29.8 pg (ref 26.0–34.0)
MCHC: 29.8 g/dL — ABNORMAL LOW (ref 30.0–36.0)
MCV: 100 fL (ref 80.0–100.0)
Platelets: 190 10*3/uL (ref 150–400)
RBC: 3.29 MIL/uL — ABNORMAL LOW (ref 4.22–5.81)
RDW: 16.8 % — ABNORMAL HIGH (ref 11.5–15.5)
WBC: 5.8 10*3/uL (ref 4.0–10.5)
nRBC: 0.7 % — ABNORMAL HIGH (ref 0.0–0.2)

## 2019-10-25 LAB — GLUCOSE, CAPILLARY
Glucose-Capillary: 100 mg/dL — ABNORMAL HIGH (ref 70–99)
Glucose-Capillary: 108 mg/dL — ABNORMAL HIGH (ref 70–99)
Glucose-Capillary: 132 mg/dL — ABNORMAL HIGH (ref 70–99)
Glucose-Capillary: 135 mg/dL — ABNORMAL HIGH (ref 70–99)
Glucose-Capillary: 170 mg/dL — ABNORMAL HIGH (ref 70–99)
Glucose-Capillary: 205 mg/dL — ABNORMAL HIGH (ref 70–99)

## 2019-10-25 LAB — HEPARIN LEVEL (UNFRACTIONATED)
Heparin Unfractionated: 0.2 IU/mL — ABNORMAL LOW (ref 0.30–0.70)
Heparin Unfractionated: 0.38 IU/mL (ref 0.30–0.70)
Heparin Unfractionated: 0.54 IU/mL (ref 0.30–0.70)

## 2019-10-25 LAB — RENAL FUNCTION PANEL
Albumin: 2 g/dL — ABNORMAL LOW (ref 3.5–5.0)
Anion gap: 6 (ref 5–15)
BUN: 14 mg/dL (ref 6–20)
CO2: 26 mmol/L (ref 22–32)
Calcium: 7.2 mg/dL — ABNORMAL LOW (ref 8.9–10.3)
Chloride: 103 mmol/L (ref 98–111)
Creatinine, Ser: 1.66 mg/dL — ABNORMAL HIGH (ref 0.61–1.24)
GFR calc Af Amer: 58 mL/min — ABNORMAL LOW (ref >60)
GFR calc non Af Amer: 50 mL/min — ABNORMAL LOW (ref >60)
Glucose, Bld: 152 mg/dL — ABNORMAL HIGH (ref 70–99)
Phosphorus: 2.4 mg/dL — ABNORMAL LOW (ref 2.5–4.6)
Potassium: 4.3 mmol/L (ref 3.5–5.1)
Sodium: 135 mmol/L (ref 135–145)

## 2019-10-25 LAB — MAGNESIUM: Magnesium: 2.3 mg/dL (ref 1.7–2.4)

## 2019-10-25 LAB — PROCALCITONIN: Procalcitonin: 0.23 ng/mL

## 2019-10-25 LAB — C-REACTIVE PROTEIN: CRP: 8.6 mg/dL — ABNORMAL HIGH (ref <1.0)

## 2019-10-25 LAB — PHOSPHORUS: Phosphorus: 2.5 mg/dL (ref 2.5–4.6)

## 2019-10-25 MED ORDER — METHYLPREDNISOLONE SODIUM SUCC 40 MG IJ SOLR
40.0000 mg | Freq: Two times a day (BID) | INTRAMUSCULAR | Status: DC
  Administered 2019-10-25 – 2019-10-30 (×10): 40 mg via INTRAVENOUS
  Filled 2019-10-25 (×10): qty 1

## 2019-10-25 MED ORDER — TOCILIZUMAB 400 MG/20ML IV SOLN
800.0000 mg | Freq: Once | INTRAVENOUS | Status: AC
  Administered 2019-10-25: 800 mg via INTRAVENOUS
  Filled 2019-10-25: qty 40

## 2019-10-25 MED FILL — Medication: Qty: 1 | Status: AC

## 2019-10-25 NOTE — Progress Notes (Signed)
[facility] for IV Heparin Indication: atrial fibrillation  No Known Allergies  Patient Measurements: Height: 5\' 11"  (180.3 cm) Weight: (!) 150.8 kg (332 lb 7.3 oz) IBW/kg (Calculated) : 75.3 Heparin Dosing Weight: 106 kg  Vital Signs: Temp: 98.4 F (36.9 C) (05/07 1600) Temp Source: Core (05/07 1157) BP: 122/87 (05/07 1600) Pulse Rate: 73 (05/07 1600)  Labs: Recent Labs    10/23/19 0614 10/23/19 2122 10/23/19 0855 10/23/19 1017 10/23/19 1630 10/24/19 0352 10/24/19 0454 10/24/19 0454 10/24/19 0634 10/24/19 0634 10/24/19 1748 10/24/19 2345 10/25/19 0530 10/25/19 0531 10/25/19 0900 10/25/19 1630  HGB 9.8*  --    < >  --   --    < > 10.4*   < > 11.9*   < >  --   --  9.8* 11.2*  --   --   HCT 33.2*  --    < >  --   --    < > 33.8*   < > 35.0*  --   --   --  32.9* 33.0*  --   --   PLT 156  --   --   --   --   --  174  --   --   --   --   --  190  --   --   --   HEPARINUNFRC 0.29*   < >  --   --   --   --  0.17*   < >  --   --   --  0.20*  --   --  0.38 0.54  CREATININE 2.09*   < >  --   --  2.12*  --  1.72*   < >  --   --  1.78*  --  1.64*  --   --  1.66*  TROPONINIHS  --   --   --  17 17  --   --   --   --   --   --   --   --   --   --   --    < > = values in this interval not displayed.    Estimated Creatinine Clearance: 86.5 mL/min (A) (by C-G formula based on SCr of 1.66 mg/dL (H)).   Assessment: 44 yr old male presented with severe fluid overload and was found to have COVID. He is on apixaban PTA for hx a fib (last dose on 4/27@1407 ). Scr continues to increase from 4.81 to 5.48 (pt refusing HD). Pharmacy was consulted to transition pt from apixaban to IV heparin.  Heparin level remains therapeutic this PM (HL 0.54, goal of 0.3-0.7).  Hgb/Hct stable - no bleeding noted at this time.  CRRT to continue per Nephrology note.   Goal of Therapy:  Heparin level 0.3-0.7 units/ml aPTT 66-102 seconds Monitor platelets by  anticoagulation protocol: Yes   Plan:  - Continue Heparin at 1900 units/hr (19 ml/hr) - Will monitor daily heparin level and CBC - Will continue to monitor for any signs/symptoms of bleeding   Thank you for allowing pharmacy to be a part of this patient's care.  Sloan Leiter, PharmD, BCPS, BCCCP Clinical Pharmacist 10/25/2019 4:58 PM   **Pharmacist phone directory can now be found on [email or web address] ([email or web address]).  Listed under [facility].

## 2019-10-25 NOTE — Progress Notes (Signed)
RT NOTE:  Pt turned to supine position. ETT secured throughout. No complication.

## 2019-10-25 NOTE — Progress Notes (Signed)
RT NOTE:  Pt in prone position. Pt's head turned to the LEFT without complication. ETT secured throughout. Arms rotated.

## 2019-10-25 NOTE — Progress Notes (Signed)
Pt proned at this time with no complications. Pt stable throughout. Ett securely taped in proper position

## 2019-10-25 NOTE — Progress Notes (Signed)
[facility] for IV Heparin Indication: atrial fibrillation  No Known Allergies  Patient Measurements: Height: 5\' 11"  (180.3 cm) Weight: (!) 151.6 kg (334 lb 3.5 oz) IBW/kg (Calculated) : 75.3 Heparin Dosing Weight: 106 kg  Vital Signs: Temp: 98.1 F (36.7 C) (05/07 0005) Temp Source: Bladder (05/06 1930) BP: 91/54 (05/07 0000) Pulse Rate: 66 (05/07 0005)  Labs: Recent Labs    10/22/19 0740 10/22/19 1626 10/23/19 2979 10/23/19 8921 10/23/19 0855 10/23/19 1011 10/23/19 1017 10/23/19 1630 10/24/19 0352 10/24/19 0352 10/24/19 0454 10/24/19 0634 10/24/19 1748 10/24/19 2345  HGB 9.8*  --  9.8*  --    < >   < >  --   --  11.6*   < > 10.4* 11.9*  --   --   HCT 33.2*  --  33.2*  --    < >   < >  --   --  34.0*  --  33.8* 35.0*  --   --   PLT 139*  --  156  --   --   --   --   --   --   --  174  --   --   --   APTT 74*  --   --   --   --   --   --   --   --   --   --   --   --   --   HEPARINUNFRC 0.41  --  0.29*  --   --   --   --   --   --   --  0.17*  --   --  0.20*  CREATININE 2.24*   < > 2.09*   < >  --   --   --  2.12*  --   --  1.72*  --  1.78*  --   TROPONINIHS  --   --   --   --   --   --  17 17  --   --   --   --   --   --    < > = values in this interval not displayed.    Estimated Creatinine Clearance: 80.9 mL/min (A) (by C-G formula based on SCr of 1.78 mg/dL (H)).   Assessment: 44 yr old male presented with severe fluid overload and was found to have COVID. He is on apixaban PTA for hx a fib (last dose on 4/27@1407 ). Scr continues to increase from 4.81 to 5.48 (pt refusing HD). Pharmacy was consulted to transition pt from apixaban to IV heparin.  Heparin level this morning is SUBtherapeutic (HL 0.17 << 0.29, goal of 0.3-0.7). Hgb/Hct stable - no bleeding noted at this time.   5/7 AM update:  Heparin level low after re-start s/p central line  Goal of Therapy:  Heparin level 0.3-0.7 units/ml aPTT 66-102 seconds Monitor  platelets by anticoagulation protocol: Yes   Plan:  - Increase Heparin drip rate to 1900 units/hr - Will continue to monitor for any signs/symptoms of bleeding and will follow up with heparin level in 8 hours  Narda Bonds, PharmD, [facility] Pharmacist Phone: [phone]

## 2019-10-25 NOTE — Progress Notes (Addendum)
NAME:  Joshua Massey, MRN:  [id number], DOB:  18-May-1976, LOS: 43 ADMISSION DATE:  09/25/2019, CONSULTATION DATE: 10/23/2019 REFERRING MD: Dr. Waldron Labs, CHIEF COMPLAINT: Acute respiratory failure  Brief History   44 year old man.  Acute renal failure and acute respiratory failure due to acute on chronic systolic and diastolic CHF, medical noncompliance.  COVID-19 positive, consider impact of Covid pneumonitis.  CVVH started 4/30.  Intubated 5/5  History of present illness   44 year old man with known cardiomyopathy, atrial fibrillation, stage III kidney disease, obesity, admitted with dyspnea, massive volume overload and apparently off his diuretics for several weeks.  Also found to be incidentally positive for COVID-19.  Admitted for volume removal, transition to HD on 10/18/2019 and has been tolerating CVVH.  Became acutely unresponsive 5/5, was found without oxygen in place, hypoxic.  Intubated 5/5  Past Medical History     has a past medical history of CHF (congestive heart failure) ([place] [place]), Chronic kidney disease, Diabetes mellitus, Exogenous obesity, GERD (gastroesophageal reflux disease), Hypertension, Left foot infection (06/21/2018), and Osteomyelitis of foot, left, acute ([place]) (07/04/2018).  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  No acute findings.   Micro Data:  SARS CoV2 4/27 >> positive Influenza A/B 4/27 >> negative MRSA screen 4/28 >> negative Blood 4/30 >>   Antimicrobials/COVID rx  Remdesivir 4/29 >> 5/3 Solumedrol 4/29-5/2, restart 5/7 >> Tocilizumab 5/6, 5/7  Interim history/subjective:   Remains prone.  Still hypoxic on ABG  Objective   Blood pressure (!) 87/55, pulse 74, temperature 97.9 F (36.6 C), resp. rate (!) 21, height 5\' 11"  (1.803 m), weight (!) 150.8 kg, SpO2 90 %. CVP:  [11 mmHg-13 mmHg] 11 mmHg  Vent Mode: PRVC FiO2 (%):  [100 %] 100 % Set Rate:  [21 bmp] 21 bmp Vt Set:  [450 mL] 450 mL PEEP:  [16 cmH20] 16 cmH20 Plateau Pressure:  [28 cmH20-30 cmH20] 29 cmH20   Intake/Output Summary (Last 24 hours) at 10/25/2019 1157 Last data filed at 10/25/2019 1100 Gross per 24 hour  Intake 2733.15 ml  Output 4031 ml  Net -1297.85 ml   Filed Weights   10/23/19 0419 10/24/19 0500 10/25/19 0500  Weight: (!) 151.6 kg (!) 151.6 kg (!) 150.8 kg    Examination: HEENT:  EOMI, sclera anicteric Neck:     No masses; no thyromegaly, ETT Lungs:    Clear to auscultation bilaterally; normal respiratory effort CV:         Regular rate and rhythm; no murmurs Abd:      + bowel sounds; soft, non-tender; no palpable masses, no distension Ext:    1-2+ edema; adequate peripheral perfusion Skin:      Warm and dry; no rash Neuro: Sedated  Labs significant for BUN/creatinine 13/1.64, CRP 8.6, procalcitonin 0.23 WBC 5.8 Chest x-ray 5/7-stable bilateral airspace opacities right greater than left.  Resolved Hospital Problem list     Assessment & Plan:  Acute hypoxemic respiratory failure, suspect multifactorial in the setting of bilateral pulmonary infiltrates.  Contribution of combined systolic and diastolic acute CHF as well as possible Covid pneumonitis.    Given asymmetric lung consolidation and the fact that he is already I/O 14 L negative I suspect his acute decompensation is more due  to progressive COVID-19 pneumonia. 6cc/kg low tidal volume ventilation Continue proning protocol today. Repeat tocilizumab today as there is no improvement and he needs higher dose due to obesity Low suspicion for superimposed infection as procalcitonin is low.  Acute hypoxemic encephalopathy.  Suspect due to his acute hypoxemia.  CT head is unremarkable Continue sedation protocol Lexapro, neurontin held  Acute on chronic (stage III) renal failure Back off on volume removal as pressor requirements are increasing Note that given his medical noncompliance likely a poor candidate for long-term hemodialysis.  This will have to be sorted out as his acute issues stabilize  Acute on chronic systolic and diastolic CHF with severe volume overload.  Associated severe RV dysfunction and right heart failure Hypertension Holding carvedilol, imdur  Atrial fibrillation On heparin infusion Continue amiodarone  Hyperglycemia Sliding-scale insulin as ordered  Hyperlipidemia Atorvastatin as ordered  Anemia of chronic disease Follow CBC  Left plantar ulcer, Charcot foot. No signs of cellulitis or osteomyelitis by MRI Appreciate Dr. Jess Barters assistance, plan to continue wound care and follow with him as an outpatient assuming no acute changes  Best practice:  Diet: NPO, Tube feeds Pain/Anxiety/Delirium protocol (if indicated): Fentanyl infusion, Versed as needed VAP protocol (if indicated): In place DVT prophylaxis: Therapeutic heparin GI prophylaxis: Protonix IV Glucose control: SSI Mobility:BR Code Status: Full Family Communication: Mom updated 5/6. No answer 5/7 Disposition: ICU  Critical care time:     The patient is critically ill with multiple organ system failure and requires high complexity decision making for assessment and support, frequent evaluation and titration of therpies, advanced monitoring, review of radiographic studies and interpretation of complex data.   Critical  Care Time devoted to patient care services, exclusive of separately billable procedures, described in this note is 35 minutes.   [name] MD [facility] Please see [email or web address] for pager details.  10/25/2019, 11:57 AM

## 2019-10-25 NOTE — Progress Notes (Signed)
ANTICOAGULATION CONSULT NOTE   Pharmacy Consult for IV Heparin Indication: atrial fibrillation  No Known Allergies  Patient Measurements: Height: 5\' 11"  (180.3 cm) Weight: (!) 150.8 kg (332 lb 7.3 oz) IBW/kg (Calculated) : 75.3 Heparin Dosing Weight: 106 kg  Vital Signs: Temp: 97.5 F (36.4 C) (05/07 1000) Temp Source: Rectal (05/07 1000) BP: 86/52 (05/07 1000) Pulse Rate: 70 (05/07 1000)  Labs: Recent Labs    10/23/19 0614 10/23/19 9373 10/23/19 0855 10/23/19 1017 10/23/19 1630 10/24/19 0352 10/24/19 0454 10/24/19 0454 10/24/19 0634 10/24/19 0634 10/24/19 1748 10/24/19 2345 10/25/19 0530 10/25/19 0531 10/25/19 0900  HGB 9.8*  --    < >  --   --    < > 10.4*   < > 11.9*   < >  --   --  9.8* 11.2*  --   HCT 33.2*  --    < >  --   --    < > 33.8*   < > 35.0*  --   --   --  32.9* 33.0*  --   PLT 156  --   --   --   --   --  174  --   --   --   --   --  190  --   --   HEPARINUNFRC 0.29*   < >  --   --   --   --  0.17*  --   --   --   --  0.20*  --   --  0.38  CREATININE 2.09*   < >  --   --  2.12*  --  1.72*  --   --   --  1.78*  --  1.64*  --   --   TROPONINIHS  --   --   --  17 17  --   --   --   --   --   --   --   --   --   --    < > = values in this interval not displayed.    Estimated Creatinine Clearance: 87.6 mL/min (A) (by C-G formula based on SCr of 1.64 mg/dL (H)).   Assessment: 44 yr old male presented with severe fluid overload and was found to have COVID. He is on apixaban PTA for hx a fib (last dose on 4/27@1407 ). Scr continues to increase from 4.81 to 5.48 (pt refusing HD). Pharmacy was consulted to transition pt from apixaban to IV heparin.  Heparin level this morning is therapeutic (HL 0.38 << 0.2, goal of 0.3-0.7). Hgb/Hct stable - no bleeding noted at this time.   Goal of Therapy:  Heparin level 0.3-0.7 units/ml aPTT 66-102 seconds Monitor platelets by anticoagulation protocol: Yes   Plan:  - Continue Heparin at 1900 units/hr (19 ml/hr) -  Will continue to monitor for any signs/symptoms of bleeding and will follow up with heparin level in 8 hours to confirm therapeutic   Thank you for allowing pharmacy to be a part of this patient's care.  Alycia Rossetti, PharmD, BCPS Clinical Pharmacist Clinical phone for 10/25/2019: [phone] 10/25/2019 11:12 AM   **Pharmacist phone directory can now be found on [email or web address] ([email or web address]).  Listed under [facility].

## 2019-10-25 NOTE — Progress Notes (Signed)
PT Cancellation/Discontinue Note  Patient Details Name: Joshua Massey MRN: [id number] DOB: 10/09/1975   Cancelled Treatment:    Reason Eval/Treat Not Completed: Medical issues which prohibited therapy; patient vented and sedated/paralyzed, will cancel for today and d/c PT as RN reports performing ROM with nursing care.  Please order PT when stable and able to participate.    [name] 10/25/2019, 11:13 AM  [name], [place] [phone] 10/25/2019

## 2019-10-25 NOTE — Progress Notes (Addendum)
Patient ID: Joshua Massey, male   DOB: 01/28/1976, 44 y.o.   MRN: [id number] S: Pt remains hypoxic and is prone on ventillator.  Has also become more hypotensive requiring pressors overnight.  O:BP (!) 87/55   Pulse 74   Temp 98.4 F (36.9 C) (Core)   Resp (!) 21   Ht 5' 11"  (1.803 m)   Wt (!) 150.8 kg   SpO2 90%   BMI 46.37 kg/m   Intake/Output Summary (Last 24 hours) at 10/25/2019 1349 Last data filed at 10/25/2019 1300 Gross per 24 hour  Intake 2950.85 ml  Output 4419 ml  Net -1468.15 ml   Intake/Output: I/O last 3 completed shifts: In: 3009.6 [I.V.:1822.1; NG/GT:822.5; IV Piggyback:365] Out: 5456 [Urine:125; Emesis/NG output:30; YBWLS:9373]  Intake/Output this shift:  Total I/O In: 977.3 [I.V.:617.3; NG/GT:360] Out: 1324 [Other:1324] Weight change: -0.8 kg Physical exam: unable to complete due to COVID + status.  In order to preserve PPE equipment and to minimize exposure to providers.  Notes from other caregivers reviewed  Recent Labs  Lab 10/18/19 2200 10/19/19 1100 10/21/19 1521 10/21/19 1521 10/22/19 0740 10/22/19 0740 10/22/19 1626 10/22/19 1626 10/23/19 4287 10/23/19 0855 10/23/19 1630 10/24/19 0352 10/24/19 0454 10/24/19 0634 10/24/19 1748 10/25/19 0530 10/25/19 0531  NA 140   < > 137   < > 135   < > 137   < > 137   < > 137 138 138 139 137 135 138  K 5.1   < > 4.4   < > 4.4   < > 4.5   < > 4.3   < > 4.0 3.8 3.8 3.9 3.9 4.1 4.1  CL 109   < > 104   < > 103  --  106  --  104  --  104  --  107  --  102 104  --   CO2 18*   < > 26   < > 26  --  27  --  27  --  23  --  25  --  26 27  --   GLUCOSE 130*   < > 124*   < > 95  --  96  --  89  --  78  --  83  --  113* 123*  --   BUN 102*   < > 38*   < > 29*  --  26*  --  21*  --  21*  --  16  --  16 13  --   CREATININE 4.98*   < > 2.38*   < > 2.24*  --  2.16*  --  2.09*  --  2.12*  --  1.72*  --  1.78* 1.64*  --   ALBUMIN 2.8*   < > 2.5*  --  2.4*  --  2.4*  --  2.3*  --  2.1*  --  2.0*  2.0*  --  1.7*  --   --    CALCIUM 7.8*   < > 7.7*   < > 7.6*  --  7.5*  --  7.7*  --  7.5*  --  7.4*  --  6.8* 6.8*  --   PHOS  --    < > 2.3*   < > 2.2*  --  2.7  --  2.4*  --  2.3*  --  1.3*  --  6.4* 2.5  --   AST 31  --   --   --   --   --   --   --   --   --   --   --  18  --   --   --   --   ALT 13  --   --   --   --   --   --   --   --   --   --   --  12  --   --   --   --    < > = values in this interval not displayed.   Liver Function Tests: Recent Labs  Lab 10/18/19 2200 10/19/19 1100 10/23/19 1630 10/24/19 0454 10/24/19 1748  AST 31  --   --  18  --   ALT 13  --   --  12  --   ALKPHOS 87  --   --  70  --   BILITOT 1.5*  --   --  1.5*  --   PROT 7.0  --   --  6.2*  --   ALBUMIN 2.8*   < > 2.1* 2.0*  2.0* 1.7*   < > = values in this interval not displayed.   No results for input(s): LIPASE, AMYLASE in the last 168 hours. Recent Labs  Lab 10/18/19 2134  AMMONIA 53*   CBC: Recent Labs  Lab 10/21/19 0227 10/21/19 0227 10/22/19 0740 10/22/19 0740 10/23/19 3825 10/23/19 0855 10/24/19 0454 10/24/19 0454 10/24/19 0634 10/25/19 0530 10/25/19 0531  WBC 6.9   < > 6.5   < > 7.4  --  6.8  --   --  5.8  --   HGB 9.6*   < > 9.8*   < > 9.8*   < > 10.4*   < > 11.9* 9.8* 11.2*  HCT 30.9*   < > 33.2*   < > 33.2*   < > 33.8*   < > 35.0* 32.9* 33.0*  MCV 95.4  --  99.7  --  101.8*  --  96.8  --   --  100.0  --   PLT 145*   < > 139*   < > 156  --  174  --   --  190  --    < > = values in this interval not displayed.   Cardiac Enzymes: No results for input(s): CKTOTAL, CKMB, CKMBINDEX, TROPONINI in the last 168 hours. CBG: Recent Labs  Lab 10/24/19 1931 10/24/19 2258 10/25/19 0319 10/25/19 0745 10/25/19 1157  GLUCAP 82 109* 100* 108* 132*    Iron Studies: No results for input(s): IRON, TIBC, TRANSFERRIN, FERRITIN in the last 72 hours. Studies/Results: DG Chest Port 1 View  Result Date: 10/25/2019 CLINICAL DATA:  Hypoxia EXAM: PORTABLE CHEST 1 VIEW COMPARISON:  Oct 24, 2019 FINDINGS:  Endotracheal tube tip is 4.3 cm above the carina. Nasogastric tube tip and side port are below the diaphragm. Central catheter tip is at the cavoatrial junction. No pneumothorax. There is airspace consolidation in both upper lobes, considerably more on the right than on the left. There is atelectatic change in the left base with small left pleural effusion. Heart is enlarged with pulmonary vascularity normal. No adenopathy. No bone lesions. IMPRESSION: Tube and catheter positions as described without pneumothorax. Airspace opacity in the upper lobes, considerably more on the right than the left, essentially stable. Stable small left pleural effusion with left base atelectasis. Stable cardiomegaly. Electronically Signed   By: Lowella Grip III M.D.   On: 10/25/2019 08:00   DG CHEST PORT 1 VIEW  Result Date: 10/24/2019 CLINICAL DATA:  PICC placement. Pulmonary infiltrates. Cardiomegaly. EXAM: PORTABLE CHEST 1 VIEW  12:24 p.m. COMPARISON:  10/24/2019 at 5:30 a.m. and 10/23/2019 FINDINGS: PICC tip is 6 cm below the carina at the cavoatrial junction. Endotracheal tube is 4.6 cm above the carina in good position. NG tube tip is below the diaphragm. Right-sided central venous catheter tip is in the upper right atrium, unchanged. Persistent unchanged interval traits on the right. Slight increased infiltrate at the left lung base. Heart size and pulmonary vascularity are unchanged. No acute bone abnormality. No pneumothorax. IMPRESSION: 1. PICC tip is 6 cm below the carina at the cavoatrial junction. 2. Persistent patchy infiltrates on the right. 3. Slight increased infiltrate at the left lung base. Electronically Signed   By: Lorriane Shire M.D.   On: 10/24/2019 12:38   DG Chest Port 1 View  Result Date: 10/24/2019 CLINICAL DATA:  Covid-19 positive. Intubated on 10/23/2019. EXAM: PORTABLE CHEST 1 VIEW COMPARISON:  10/23/2019 FINDINGS: Endotracheal tube is in place, tip approximately 2.5 centimeters above the  carina. Nasogastric tube is in place, tip off the film beyond the gastroesophageal junction. RIGHT IJ central line tip overlies the superior vena cava. Stable cardiomegaly. Patchy opacity in the LEFT lung appears stable. More confluent opacity in the RIGHT lung, particularly within the RIGHT UPPER lobe and RIGHT mid lung zone, appears stable. IMPRESSION: 1. No significant change in bilateral airspace filling opacities. 2. Stable cardiomegaly. Electronically Signed   By: Nolon Nations M.D.   On: 10/24/2019 08:04   . amiodarone  200 mg Per Tube Daily  . artificial tears  1 application Both Eyes I6O  . atorvastatin  10 mg Per Tube Q supper  . B-complex with vitamin C  1 tablet Per Tube Daily  . chlorhexidine gluconate (MEDLINE KIT)  15 mL Mouth Rinse BID  . Chlorhexidine Gluconate Cloth  6 each Topical Daily  . docusate  100 mg Per Tube BID  . feeding supplement (PRO-STAT SUGAR FREE 64)  60 mL Per Tube TID  . insulin aspart  0-15 Units Subcutaneous Q4H  . mouth rinse  15 mL Mouth Rinse 10 times per day  . methylPREDNISolone (SOLU-MEDROL) injection  40 mg Intravenous Q12H  . pantoprazole (PROTONIX) IV  40 mg Intravenous QHS  . polyethylene glycol  17 g Per Tube Daily  . sodium chloride flush  3 mL Intravenous Q12H    BMET    Component Value Date/Time   NA 138 10/25/2019 0531   NA 142 09/12/2016 0000   K 4.1 10/25/2019 0531   CL 104 10/25/2019 0530   CO2 27 10/25/2019 0530   GLUCOSE 123 (H) 10/25/2019 0530   BUN 13 10/25/2019 0530   BUN 58 (A) 09/12/2016 0000   CREATININE 1.64 (H) 10/25/2019 0530   CREATININE 2.63 (H) 12/20/2016 1613   CALCIUM 6.8 (L) 10/25/2019 0530   GFRNONAA 51 (L) 10/25/2019 0530   GFRAA 59 (L) 10/25/2019 0530   CBC    Component Value Date/Time   WBC 5.8 10/25/2019 0530   RBC 3.29 (L) 10/25/2019 0530   HGB 11.2 (L) 10/25/2019 0531   HCT 33.0 (L) 10/25/2019 0531   PLT 190 10/25/2019 0530   MCV 100.0 10/25/2019 0530   MCH 29.8 10/25/2019 0530   MCHC 29.8  (L) 10/25/2019 0530   RDW 16.8 (H) 10/25/2019 0530   LYMPHSABS 0.5 (L) 11/27/2017 1422   MONOABS 0.7 11/27/2017 1422   EOSABS 0.1 11/27/2017 1422   BASOSABS 0.0 11/27/2017 1422    Assessment/Plan: 1. Respiratory arrest/AMS- s/p intubated 5/5 and sent for head CT that  was without acute findings except paranasal sinus disease.  His hypoxia persists and is more hypotensive now on pressors.  I agree with Dr. Vaughan Browner, this is not likely related to volume as he is negative 16 liters since admission and is likely related to worsening covid pneumonia.  Prognosis appears grim.   2. Shock- likely due to covid-19 infection but will decrease UF goal since he is now on pressors. 3. 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 He has had several episodes over the past year similar to this and usually due to noncompliance with his diuretics and follow up. He was started on a lasix drip on 10/15/19 without much improvement despite multiple doses of metolazone. 1. Started on CRRT 10/18/19 with marked improvement of volume and electrolytes. 2. 4K/2.5Ca for all fluids. 3. Will decrease Goal UF to 0-50 ml/hr due to dropping BP'sandneed for levophed drip. 4. Non-tunneled HD catheter placed 10/18/19 5. Continue with CRRT  4. Acute on chronic combined diastolic and systolic CHF- due to noncompliance with diuretics. Significant anasarca and no response to lasix drip. Echo with EF 04-04% EF, RV systolic function severely reduced and grade III DD (restrictive). Plan as outlined above. 5. Acute hypoxic respiratory failure due to #2 and covid-19 Pneumonia-now on non-rebreather. Treatment per primary svc 6. Hypophosphatemia- due to CRRT. Will repleted IV several times with improvement but will likely require it again tomorrow.  7. Left charcot join with plantar ulcer- followed by Dr. Sharol Given 8. P. Atrial fib- on amio per  Cardiology. 9. Anemia of CKD -started on Aranesp 40 mcg on 10/18/19. Continue to follow. 10. Medical noncompliance- ongoing issue, poor health literacy and insight. 11. Disposition- poor overall prognosis. Not certain he would tolerate intermittent HD given biventricular heart failure and low bp's. He declined starting dialysisinitiallyand is not sure he would agree to long term dialysis.Recommend Palliative Careconsult to help set goals/limits of care. Donetta Potts, MD [employer] [phone]

## 2019-10-26 ENCOUNTER — Inpatient Hospital Stay (HOSPITAL_COMMUNITY): Payer: BC Managed Care – PPO

## 2019-10-26 LAB — POCT I-STAT 7, (LYTES, BLD GAS, ICA,H+H)
Acid-Base Excess: 0 mmol/L (ref 0.0–2.0)
Acid-base deficit: 1 mmol/L (ref 0.0–2.0)
Acid-base deficit: 1 mmol/L (ref 0.0–2.0)
Bicarbonate: 28 mmol/L (ref 20.0–28.0)
Bicarbonate: 27.9 mmol/L (ref 20.0–28.0)
Bicarbonate: 27.8 mmol/L (ref 20.0–28.0)
Calcium, Ion: 1.17 mmol/L (ref 1.15–1.40)
Calcium, Ion: 1.16 mmol/L (ref 1.15–1.40)
Calcium, Ion: 1.13 mmol/L — ABNORMAL LOW (ref 1.15–1.40)
HCT: 37 % — ABNORMAL LOW (ref 39.0–52.0)
HCT: 36 % — ABNORMAL LOW (ref 39.0–52.0)
HCT: 35 % — ABNORMAL LOW (ref 39.0–52.0)
Hemoglobin: 12.6 g/dL — ABNORMAL LOW (ref 13.0–17.0)
Hemoglobin: 12.2 g/dL — ABNORMAL LOW (ref 13.0–17.0)
Hemoglobin: 11.9 g/dL — ABNORMAL LOW (ref 13.0–17.0)
O2 Saturation: 86 %
O2 Saturation: 99 %
O2 Saturation: 98 %
Patient temperature: 97
Patient temperature: 37.2
Potassium: 4.6 mmol/L (ref 3.5–5.1)
Potassium: 4.8 mmol/L (ref 3.5–5.1)
Potassium: 5 mmol/L (ref 3.5–5.1)
Sodium: 137 mmol/L (ref 135–145)
Sodium: 136 mmol/L (ref 135–145)
Sodium: 136 mmol/L (ref 135–145)
TCO2: 30 mmol/L (ref 22–32)
TCO2: 30 mmol/L (ref 22–32)
TCO2: 30 mmol/L (ref 22–32)
pCO2 arterial: 67.9 mmHg (ref 32.0–48.0)
pCO2 arterial: 64.8 mmHg — ABNORMAL HIGH (ref 32.0–48.0)
pCO2 arterial: 58.2 mmHg — ABNORMAL HIGH (ref 32.0–48.0)
pH, Arterial: 7.218 — ABNORMAL LOW (ref 7.350–7.450)
pH, Arterial: 7.244 — ABNORMAL LOW (ref 7.350–7.450)
pH, Arterial: 7.287 — ABNORMAL LOW (ref 7.350–7.450)
pO2, Arterial: 61 mmHg — ABNORMAL LOW (ref 83.0–108.0)
pO2, Arterial: 162 mmHg — ABNORMAL HIGH (ref 83.0–108.0)
pO2, Arterial: 115 mmHg — ABNORMAL HIGH (ref 83.0–108.0)

## 2019-10-26 LAB — RENAL FUNCTION PANEL
Albumin: 2.2 g/dL — ABNORMAL LOW (ref 3.5–5.0)
Anion gap: 9 (ref 5–15)
BUN: 22 mg/dL — ABNORMAL HIGH (ref 6–20)
CO2: 26 mmol/L (ref 22–32)
Calcium: 7.3 mg/dL — ABNORMAL LOW (ref 8.9–10.3)
Chloride: 100 mmol/L (ref 98–111)
Creatinine, Ser: 1.69 mg/dL — ABNORMAL HIGH (ref 0.61–1.24)
GFR calc Af Amer: 57 mL/min — ABNORMAL LOW (ref >60)
GFR calc non Af Amer: 49 mL/min — ABNORMAL LOW (ref >60)
Glucose, Bld: 209 mg/dL — ABNORMAL HIGH (ref 70–99)
Phosphorus: 1.9 mg/dL — ABNORMAL LOW (ref 2.5–4.6)
Potassium: 5 mmol/L (ref 3.5–5.1)
Sodium: 135 mmol/L (ref 135–145)

## 2019-10-26 LAB — CBC
HCT: 37.4 % — ABNORMAL LOW (ref 39.0–52.0)
Hemoglobin: 10.7 g/dL — ABNORMAL LOW (ref 13.0–17.0)
MCH: 29.7 pg (ref 26.0–34.0)
MCHC: 28.6 g/dL — ABNORMAL LOW (ref 30.0–36.0)
MCV: 103.9 fL — ABNORMAL HIGH (ref 80.0–100.0)
Platelets: 207 10*3/uL (ref 150–400)
RBC: 3.6 MIL/uL — ABNORMAL LOW (ref 4.22–5.81)
RDW: 17.1 % — ABNORMAL HIGH (ref 11.5–15.5)
WBC: 11.1 10*3/uL — ABNORMAL HIGH (ref 4.0–10.5)
nRBC: 0.2 % (ref 0.0–0.2)

## 2019-10-26 LAB — GLUCOSE, CAPILLARY
Glucose-Capillary: 181 mg/dL — ABNORMAL HIGH (ref 70–99)
Glucose-Capillary: 188 mg/dL — ABNORMAL HIGH (ref 70–99)
Glucose-Capillary: 188 mg/dL — ABNORMAL HIGH (ref 70–99)
Glucose-Capillary: 200 mg/dL — ABNORMAL HIGH (ref 70–99)
Glucose-Capillary: 200 mg/dL — ABNORMAL HIGH (ref 70–99)
Glucose-Capillary: 211 mg/dL — ABNORMAL HIGH (ref 70–99)

## 2019-10-26 LAB — BASIC METABOLIC PANEL
Anion gap: 7 (ref 5–15)
BUN: 17 mg/dL (ref 6–20)
CO2: 26 mmol/L (ref 22–32)
Calcium: 7.2 mg/dL — ABNORMAL LOW (ref 8.9–10.3)
Chloride: 102 mmol/L (ref 98–111)
Creatinine, Ser: 1.61 mg/dL — ABNORMAL HIGH (ref 0.61–1.24)
GFR calc Af Amer: >60 mL/min (ref >60)
GFR calc non Af Amer: 52 mL/min — ABNORMAL LOW (ref >60)
Glucose, Bld: 243 mg/dL — ABNORMAL HIGH (ref 70–99)
Potassium: 4.5 mmol/L (ref 3.5–5.1)
Sodium: 135 mmol/L (ref 135–145)

## 2019-10-26 LAB — PHOSPHORUS: Phosphorus: 2.7 mg/dL (ref 2.5–4.6)

## 2019-10-26 LAB — HEPARIN LEVEL (UNFRACTIONATED)
Heparin Unfractionated: 0.93 IU/mL — ABNORMAL HIGH (ref 0.30–0.70)
Heparin Unfractionated: 0.66 IU/mL (ref 0.30–0.70)

## 2019-10-26 LAB — MAGNESIUM: Magnesium: 2.4 mg/dL (ref 1.7–2.4)

## 2019-10-26 MED ORDER — INSULIN DETEMIR 100 UNIT/ML ~~LOC~~ SOLN
5.0000 [IU] | Freq: Two times a day (BID) | SUBCUTANEOUS | Status: DC
  Administered 2019-10-26 – 2019-10-27 (×3): 5 [IU] via SUBCUTANEOUS
  Filled 2019-10-26 (×5): qty 0.05

## 2019-10-26 NOTE — Progress Notes (Signed)
Patient ID: Joshua Massey, male   DOB: June 23, 1975, 44 y.o.   MRN: [id number] S: Tolerated 2 liters UF with CRRT over last 24 hours O:BP 111/70 (BP Location: Right Wrist)   Pulse 80   Temp 98.8 F (37.1 C) (Rectal)   Resp (!) 0   Ht 5' 11"  (1.803 m)   Wt (!) 150.4 kg   SpO2 99%   BMI 46.24 kg/m   Intake/Output Summary (Last 24 hours) at 10/26/2019 1116 Last data filed at 10/26/2019 1032 Gross per 24 hour  Intake 3989.74 ml  Output 5911 ml  Net -1921.26 ml   Intake/Output: I/O last 3 completed shifts: In: 5547.9 [I.V.:3528.2; NG/GT:1732.5; IV Piggyback:287.1] Out: 2683 [Other:8582]  Intake/Output this shift:  Total I/O In: 533.4 [I.V.:323.4; NG/GT:210] Out: 751 [Other:751] Weight change: -0.4 kg Physical exam: unable to complete due to COVID + status.  In order to preserve PPE equipment and to minimize exposure to providers.  Notes from other caregivers reviewed   Recent Labs  Lab 10/22/19 0740 10/22/19 0740 10/22/19 1626 10/22/19 1626 10/23/19 4196 10/23/19 0855 10/23/19 1630 10/24/19 0352 10/24/19 0454 10/24/19 0454 10/24/19 2229 10/24/19 1748 10/25/19 0530 10/25/19 0531 10/25/19 1630 10/26/19 0550 10/26/19 0659  NA 135   < > 137   < > 137   < > 137   < > 138   < > 139 137 135 138 135 135 137  K 4.4   < > 4.5   < > 4.3   < > 4.0   < > 3.8   < > 3.9 3.9 4.1 4.1 4.3 4.5 4.6  CL 103   < > 106   < > 104  --  104  --  107  --   --  102 104  --  103 102  --   CO2 26   < > 27   < > 27  --  23  --  25  --   --  26 27  --  26 26  --   GLUCOSE 95   < > 96   < > 89  --  78  --  83  --   --  113* 123*  --  152* 243*  --   BUN 29*   < > 26*   < > 21*  --  21*  --  16  --   --  16 13  --  14 17  --   CREATININE 2.24*   < > 2.16*   < > 2.09*  --  2.12*  --  1.72*  --   --  1.78* 1.64*  --  1.66* 1.61*  --   ALBUMIN 2.4*  --  2.4*  --  2.3*  --  2.1*  --  2.0*  2.0*  --   --  1.7*  --   --  2.0*  --   --   CALCIUM 7.6*   < > 7.5*   < > 7.7*  --  7.5*  --  7.4*  --   --  6.8*  6.8*  --  7.2* 7.2*  --   PHOS 2.2*   < > 2.7   < > 2.4*  --  2.3*  --  1.3*  --   --  6.4* 2.5  --  2.4* 2.7  --   AST  --   --   --   --   --   --   --   --  18  --   --   --   --   --   --   --   --   ALT  --   --   --   --   --   --   --   --  12  --   --   --   --   --   --   --   --    < > = values in this interval not displayed.   Liver Function Tests: Recent Labs  Lab 10/24/19 0454 10/24/19 1748 10/25/19 1630  AST 18  --   --   ALT 12  --   --   ALKPHOS 70  --   --   BILITOT 1.5*  --   --   PROT 6.2*  --   --   ALBUMIN 2.0*  2.0* 1.7* 2.0*   No results for input(s): LIPASE, AMYLASE in the last 168 hours. No results for input(s): AMMONIA in the last 168 hours. CBC: Recent Labs  Lab 10/22/19 0740 10/22/19 0740 10/23/19 7408 10/23/19 0855 10/24/19 0454 10/24/19 0634 10/25/19 0530 10/25/19 0530 10/25/19 0531 10/26/19 0550 10/26/19 0659  WBC 6.5   < > 7.4   < > 6.8  --  5.8  --   --  11.1*  --   HGB 9.8*   < > 9.8*   < > 10.4*   < > 9.8*   < > 11.2* 10.7* 12.6*  HCT 33.2*   < > 33.2*   < > 33.8*   < > 32.9*   < > 33.0* 37.4* 37.0*  MCV 99.7  --  101.8*  --  96.8  --  100.0  --   --  103.9*  --   PLT 139*   < > 156   < > 174  --  190  --   --  207  --    < > = values in this interval not displayed.   Cardiac Enzymes: No results for input(s): CKTOTAL, CKMB, CKMBINDEX, TROPONINI in the last 168 hours. CBG: Recent Labs  Lab 10/25/19 1533 10/25/19 1924 10/25/19 2312 10/26/19 0357 10/26/19 0821  GLUCAP 135* 170* 205* 211* 200*    Iron Studies: No results for input(s): IRON, TIBC, TRANSFERRIN, FERRITIN in the last 72 hours. Studies/Results: DG Chest Port 1 View  Result Date: 10/25/2019 CLINICAL DATA:  Hypoxia EXAM: PORTABLE CHEST 1 VIEW COMPARISON:  Oct 24, 2019 FINDINGS: Endotracheal tube tip is 4.3 cm above the carina. Nasogastric tube tip and side port are below the diaphragm. Central catheter tip is at the cavoatrial junction. No pneumothorax. There is  airspace consolidation in both upper lobes, considerably more on the right than on the left. There is atelectatic change in the left base with small left pleural effusion. Heart is enlarged with pulmonary vascularity normal. No adenopathy. No bone lesions. IMPRESSION: Tube and catheter positions as described without pneumothorax. Airspace opacity in the upper lobes, considerably more on the right than the left, essentially stable. Stable small left pleural effusion with left base atelectasis. Stable cardiomegaly. Electronically Signed   By: Lowella Grip III M.D.   On: 10/25/2019 08:00   DG CHEST PORT 1 VIEW  Result Date: 10/24/2019 CLINICAL DATA:  PICC placement. Pulmonary infiltrates. Cardiomegaly. EXAM: PORTABLE CHEST 1 VIEW 12:24 p.m. COMPARISON:  10/24/2019 at 5:30 a.m. and 10/23/2019 FINDINGS: PICC tip is 6 cm below the carina at the cavoatrial junction. Endotracheal tube is 4.6  cm above the carina in good position. NG tube tip is below the diaphragm. Right-sided central venous catheter tip is in the upper right atrium, unchanged. Persistent unchanged interval traits on the right. Slight increased infiltrate at the left lung base. Heart size and pulmonary vascularity are unchanged. No acute bone abnormality. No pneumothorax. IMPRESSION: 1. PICC tip is 6 cm below the carina at the cavoatrial junction. 2. Persistent patchy infiltrates on the right. 3. Slight increased infiltrate at the left lung base. Electronically Signed   By: Lorriane Shire M.D.   On: 10/24/2019 12:38   . amiodarone  200 mg Per Tube Daily  . artificial tears  1 application Both Eyes F6O  . atorvastatin  10 mg Per Tube Q supper  . B-complex with vitamin C  1 tablet Per Tube Daily  . chlorhexidine gluconate (MEDLINE KIT)  15 mL Mouth Rinse BID  . Chlorhexidine Gluconate Cloth  6 each Topical Daily  . docusate  100 mg Per Tube BID  . feeding supplement (PRO-STAT SUGAR FREE 64)  60 mL Per Tube TID  . insulin aspart  0-15 Units  Subcutaneous Q4H  . insulin detemir  5 Units Subcutaneous BID  . mouth rinse  15 mL Mouth Rinse 10 times per day  . methylPREDNISolone (SOLU-MEDROL) injection  40 mg Intravenous Q12H  . pantoprazole (PROTONIX) IV  40 mg Intravenous QHS  . polyethylene glycol  17 g Per Tube Daily  . sodium chloride flush  3 mL Intravenous Q12H    BMET    Component Value Date/Time   NA 137 10/26/2019 0659   NA 142 09/12/2016 0000   K 4.6 10/26/2019 0659   CL 102 10/26/2019 0550   CO2 26 10/26/2019 0550   GLUCOSE 243 (H) 10/26/2019 0550   BUN 17 10/26/2019 0550   BUN 58 (A) 09/12/2016 0000   CREATININE 1.61 (H) 10/26/2019 0550   CREATININE 2.63 (H) 12/20/2016 1613   CALCIUM 7.2 (L) 10/26/2019 0550   GFRNONAA 52 (L) 10/26/2019 0550   GFRAA >60 10/26/2019 0550   CBC    Component Value Date/Time   WBC 11.1 (H) 10/26/2019 0550   RBC 3.60 (L) 10/26/2019 0550   HGB 12.6 (L) 10/26/2019 0659   HCT 37.0 (L) 10/26/2019 0659   PLT 207 10/26/2019 0550   MCV 103.9 (H) 10/26/2019 0550   MCH 29.7 10/26/2019 0550   MCHC 28.6 (L) 10/26/2019 0550   RDW 17.1 (H) 10/26/2019 0550   LYMPHSABS 0.5 (L) 11/27/2017 1422   MONOABS 0.7 11/27/2017 1422   EOSABS 0.1 11/27/2017 1422   BASOSABS 0.0 11/27/2017 1422    Assessment/Plan: 1. Respiratory arrest/AMS- s/p intubated 5/5 and sent for head CT that was without acute findings except paranasal sinus disease.  His hypoxia persists and is more hypotensive now on pressors.  I agree with Dr. Vaughan Browner, this is not likely related to volume as he is negative 16 liters since admission and is likely related to worsening covid pneumonia.  Prognosis appears grim.   2. Shock- likely due to covid-19 infection but will decrease UF goal since he is now on pressors. 3. 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 He has had several episodes over the past year similar to this and usually due to  noncompliance with his diuretics and follow up. He was started on a lasix drip on 10/15/19 without much improvement despite multiple doses of metolazone. 1. Started on CRRT  10/18/19 with marked improvement of volume and electrolytes. 2. 4K/2.5Ca for all fluids. 3. Will decrease Goal UF to 0-50 ml/hr due to dropping BP'sandneed for levophed drip. 4. Non-tunneled HD catheter placed 10/18/19 5. Continue with CRRT 4. Acute on chronic combined diastolic and systolic CHF- due to noncompliance with diuretics. Significant anasarca and no response to lasix drip. Echo with EF 73-95% EF, RV systolic function severely reduced and grade III DD (restrictive). Plan as outlined above. 5. Acute hypoxic respiratory failure due to #2 and covid-19 Pneumonia-now on non-rebreather. Treatment per primary svc 6. Hypophosphatemia- due to CRRT. Phosphorus has been repleted IV several times with improvement but will likely require it again soon.  7. Left charcot join with plantar ulcer- followed by Dr. Sharol Given 8. P. Atrial fib- on amio per Cardiology. 9. Anemia of CKD -started on Aranesp 40 mcg on 10/18/19. Continue to follow. 10. Medical noncompliance- ongoing issue, poor health literacy and insight. 11. Disposition- poor overall prognosis. Not certain he would tolerate intermittent HD given biventricular heart failure and low bp's. He declined starting dialysisinitiallyand is not sure he would agree to long term dialysis.Recommend Palliative Careconsult to help set goals/limits of care.  Donetta Potts, MD [employer] [phone]

## 2019-10-26 NOTE — Progress Notes (Addendum)
NAME:  Joshua Massey, MRN:  [id number], DOB:  June 19, 1976, LOS: 51 ADMISSION DATE:  09/30/2019, CONSULTATION DATE: 10/23/2019 REFERRING MD: Dr. Waldron Labs, CHIEF COMPLAINT: Acute respiratory failure  Brief History   44 year old man.  Acute renal failure and acute respiratory failure due to acute on chronic systolic and diastolic CHF, medical noncompliance.  COVID-19 positive, consider impact of Covid pneumonitis.  CVVH started 4/30.  Intubated 5/5  History of present illness   44 year old man with known cardiomyopathy, atrial fibrillation, stage III kidney disease, obesity, admitted with dyspnea, massive volume overload and apparently off his diuretics for several weeks.  Also found to be incidentally positive for COVID-19.  Admitted for volume removal, transition to HD on 10/18/2019 and has been tolerating CVVH.  Became acutely unresponsive 5/5, was found without oxygen in place, hypoxic.  Intubated 5/5  Past Medical History     has a past medical history of CHF (congestive heart failure) ([place]), Chronic kidney disease, Diabetes mellitus, Exogenous obesity, GERD (gastroesophageal reflux disease), Hypertension, Left foot infection (06/21/2018), and Osteomyelitis of foot, left, acute ([place]) (07/04/2018).  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  No acute findings.   Micro Data:  SARS CoV2 4/27 >> positive Influenza A/B 4/27 >> negative MRSA screen 4/28 >> negative Blood 4/30 >>   Antimicrobials/COVID rx  Remdesivir 4/29 >> 5/3 Solumedrol 4/29-5/2, restart 5/7 >> Tocilizumab 5/6, 5/7  Interim history/subjective:   Remains hypoxic, on CVVH, critically ill with no change in status.  Objective   Blood pressure 113/70, pulse 76, temperature 97.7 F (36.5 C), temperature source Oral, resp. rate (!) 26, height 5\' 11"  (1.803 m), weight (!) 150.4 kg, SpO2 98 %. CVP:  [11 mmHg-13 mmHg] 12 mmHg  Vent Mode: PRVC FiO2 (%):  [100 %] 100 % Set Rate:  [21 bmp-26 bmp] 26 bmp Vt Set:  [450 mL] 450 mL PEEP:  [16 cmH20-22 cmH20] 22 cmH20 Plateau Pressure:  [28 cmH20-29 cmH20] 29 cmH20   Intake/Output Summary (Last 24 hours) at 10/26/2019 0910 Last data filed at 10/26/2019 0800 Gross per 24 hour  Intake 3979.33 ml  Output 5975 ml  Net -1995.67 ml   Filed Weights   10/24/19 0500 10/25/19 0500 10/26/19 0500  Weight: (!) 151.6 kg (!) 150.8 kg (!) 150.4 kg    Examination: Gen:      No acute distress, obese HEENT:  EOMI, sclera anicteric Neck:     No masses; no thyromegaly Lungs:    Clear to auscultation bilaterally; normal respiratory effort CV:         Regular rate and rhythm; no murmurs Abd:      + bowel sounds; soft, non-tender; no palpable masses, no distension Ext:    No edema; adequate peripheral perfusion Skin:      Warm and dry; no rash Neuro: Sedated  Labs significant for glucose 243, creatinine  1.61 WBC 11.1, hemoglobin 10.7 Chest x-ray 5/8-minimal improvement in bilateral interstitial opacities.  Resolved Hospital Problem list     Assessment & Plan:  Acute hypoxemic respiratory failure, suspect multifactorial in the setting of bilateral pulmonary infiltrates.  Contribution of combined systolic and diastolic acute CHF as well as possible Covid pneumonitis.   Suspect his acute decompensation is more due to  progressive COVID-19 pneumonia. 6cc/kg low tidal volume ventilation Has not responded to proning, not.  He has a new recruitable lung We increased his PEEP to 22 as he is obese Low suspicion for superimposed infection as procalcitonin is low.  Acute hypoxemic encephalopathy.  Suspect due to his acute hypoxemia.  CT head is unremarkable Continue sedation protocol Lexapro, neurontin held  Acute on chronic (stage III) renal failure Continues on CVVH Note that given his medical noncompliance likely a poor candidate for long-term hemodialysis.  This will have to be sorted out as his acute issues stabilize  Acute on chronic systolic and diastolic CHF with severe volume overload.  Associated severe RV dysfunction and right heart failure Hypertension Holding carvedilol, imdur  Atrial fibrillation On heparin infusion Continue amiodarone  Hyperglycemia Sliding-scale insulin as ordered  Hyperlipidemia Atorvastatin as ordered  Anemia of chronic disease Follow CBC  Left plantar ulcer, Charcot foot. No signs of cellulitis or osteomyelitis by MRI Appreciate Dr. Jess Barters assistance, plan to continue wound care and follow with him as an outpatient assuming no acute changes  Hyperglycemia Continue SSI Start Levemir 5 units twice daily  Best practice:  Diet: NPO, Tube feeds Pain/Anxiety/Delirium protocol (if indicated): Fentanyl infusion, Versed as needed VAP protocol (if indicated): In place DVT prophylaxis: Therapeutic heparin GI prophylaxis: Protonix IV Glucose control: SSI, Levemir Mobility:BR Code Status: Full Family Communication: Mom called and no answer on phone 5/8 Disposition: ICU  Critical care time:     The patient is critically ill with multiple organ system failure and requires high complexity decision making for assessment and support, frequent evaluation and titration of therpies, advanced monitoring, review of radiographic studies and interpretation of complex data.     Critical Care Time devoted to patient care services, exclusive of separately billable procedures, described in this note is 35 minutes.   [name] MD [facility] Please see [email or web address] for pager details.  10/26/2019, 9:10 AM

## 2019-10-26 NOTE — Progress Notes (Signed)
Patient was successfully un-proned without any difficulties.  Patient is supine now with a tube holder in placed.  Miriam, RRT assisted this RT with the turn.

## 2019-10-26 NOTE — Progress Notes (Signed)
[facility] for IV Heparin Indication: atrial fibrillation  No Known Allergies  Patient Measurements: Height: 5\' 11"  (180.3 cm) Weight: (!) 150.4 kg (331 lb 9.2 oz) IBW/kg (Calculated) : 75.3 Heparin Dosing Weight: 106 kg  Vital Signs: Temp: 99.1 F (37.3 C) (05/08 1200) Temp Source: Rectal (05/08 1200) BP: 121/76 (05/08 1200) Pulse Rate: 82 (05/08 1200)  Labs: Recent Labs    10/23/19 1630 10/24/19 0352 10/24/19 0454 10/24/19 0634 10/25/19 0530 10/25/19 0531 10/25/19 0900 10/25/19 1630 10/26/19 0550 10/26/19 0550 10/26/19 0659 10/26/19 1131 10/26/19 1225  HGB  --    < > 10.4*   < > 9.8*   < >  --   --  10.7*   < > 12.6* 12.2*  --   HCT  --    < > 33.8*   < > 32.9*   < >  --   --  37.4*  --  37.0* 36.0*  --   PLT  --   --  174  --  190  --   --   --  207  --   --   --   --   HEPARINUNFRC  --   --  0.17*   < >  --   --  0.38 0.54  --   --   --   --  0.93*  CREATININE 2.12*  --  1.72*   < > 1.64*  --   --  1.66* 1.61*  --   --   --   --   TROPONINIHS 17  --   --   --   --   --   --   --   --   --   --   --   --    < > = values in this interval not displayed.    Estimated Creatinine Clearance: 89 mL/min (A) (by C-G formula based on SCr of 1.61 mg/dL (H)).   Assessment: 44 yr old male presented with severe fluid overload and was found to have COVID. He is on apixaban PTA for hx a fib (last dose on 4/27@1407 ). Pharmacy was consulted to transition pt from apixaban to IV heparin.  Heparin level elevated this AM (HL 0.93, goal of 0.3-0.7). No issues noted  Hgb/Hct stable - no bleeding noted at this time.  CRRT to continue per Nephrology note.   Goal of Therapy:  Heparin level 0.3-0.7 units/ml aPTT 66-102 seconds Monitor platelets by anticoagulation protocol: Yes   Plan:  - Decrease Heparin to 1700 units/hr (17 ml/hr) - Check 6 hour heparin level at 1930 - Will monitor daily heparin level and CBC - Will continue to monitor for  any signs/symptoms of bleeding   Thank you for allowing pharmacy to be a part of this patient's care.  Nicoletta Dress, PharmD PGY2 Infectious Disease Pharmacy Resident  10/26/2019 12:56 PM   **Pharmacist phone directory can now be found on [email or web address] ([email or web address]).  Listed under [place].

## 2019-10-26 NOTE — Progress Notes (Signed)
[facility] for IV Heparin Indication: atrial fibrillation  No Known Allergies  Patient Measurements: Height: 5\' 11"  (180.3 cm) Weight: (!) 150.4 kg (331 lb 9.2 oz) IBW/kg (Calculated) : 75.3 Heparin Dosing Weight: 106 kg  Vital Signs: Temp: 98.1 F (36.7 C) (05/08 1900) Temp Source: Rectal (05/08 1800) BP: 140/72 (05/08 1900) Pulse Rate: 74 (05/08 1900)  Labs: Recent Labs    10/24/19 0454 10/24/19 0623 10/25/19 0530 10/25/19 0531 10/25/19 1630 10/26/19 0550 10/26/19 0550 10/26/19 0659 10/26/19 0659 10/26/19 1131 10/26/19 1225 10/26/19 1632 10/26/19 1830  HGB 10.4*   < > 9.8*   < >  --  10.7*   < > 12.6*   < > 12.2*  --  11.9*  --   HCT 33.8*   < > 32.9*   < >  --  37.4*   < > 37.0*  --  36.0*  --  35.0*  --   PLT 174  --  190  --   --  207  --   --   --   --   --   --   --   HEPARINUNFRC 0.17*   < >  --    < > 0.54  --   --   --   --   --  0.93*  --  0.66  CREATININE 1.72*   < > 1.64*   < > 1.66* 1.61*  --   --   --   --   --   --  1.69*   < > = values in this interval not displayed.    Estimated Creatinine Clearance: 84.8 mL/min (A) (by C-G formula based on SCr of 1.69 mg/dL (H)).   Assessment: 44 yr old male presented with severe fluid overload and was found to have COVID. He is on apixaban PTA for hx a fib (last dose on 4/27@1407 ). Pharmacy was consulted to transition pt from apixaban to IV heparin.  Heparin level now therapeutic at 0.66  Goal of Therapy:  Heparin level 0.3-0.7 units/ml aPTT 66-102 seconds Monitor platelets by anticoagulation protocol: Yes   Plan:  - Continue heparin at 1700 units / hr - Will monitor daily heparin level and CBC - Will continue to monitor for any signs/symptoms of bleeding   Thank you Anette Guarneri, PharmD 10/26/2019 7:42 PM   **Pharmacist phone directory can now be found on [email or web address] ([email or web address]).  Listed under [place].

## 2019-10-26 NOTE — Progress Notes (Signed)
Patient's head turned to the right without incident.  Tube still taped and in place.

## 2019-10-27 ENCOUNTER — Inpatient Hospital Stay (HOSPITAL_COMMUNITY): Payer: BC Managed Care – PPO

## 2019-10-27 LAB — RENAL FUNCTION PANEL
Albumin: 2.2 g/dL — ABNORMAL LOW (ref 3.5–5.0)
Anion gap: 7 (ref 5–15)
BUN: 24 mg/dL — ABNORMAL HIGH (ref 6–20)
CO2: 26 mmol/L (ref 22–32)
Calcium: 7.7 mg/dL — ABNORMAL LOW (ref 8.9–10.3)
Chloride: 103 mmol/L (ref 98–111)
Creatinine, Ser: 1.57 mg/dL — ABNORMAL HIGH (ref 0.61–1.24)
GFR calc Af Amer: >60 mL/min (ref >60)
GFR calc non Af Amer: 54 mL/min — ABNORMAL LOW (ref >60)
Glucose, Bld: 185 mg/dL — ABNORMAL HIGH (ref 70–99)
Phosphorus: 2.6 mg/dL (ref 2.5–4.6)
Potassium: 4.9 mmol/L (ref 3.5–5.1)
Sodium: 136 mmol/L (ref 135–145)

## 2019-10-27 LAB — CBC
HCT: 33.8 % — ABNORMAL LOW (ref 39.0–52.0)
Hemoglobin: 9.9 g/dL — ABNORMAL LOW (ref 13.0–17.0)
MCH: 30.2 pg (ref 26.0–34.0)
MCHC: 29.3 g/dL — ABNORMAL LOW (ref 30.0–36.0)
MCV: 103 fL — ABNORMAL HIGH (ref 80.0–100.0)
Platelets: 205 10*3/uL (ref 150–400)
RBC: 3.28 MIL/uL — ABNORMAL LOW (ref 4.22–5.81)
RDW: 17.2 % — ABNORMAL HIGH (ref 11.5–15.5)
WBC: 9.2 10*3/uL (ref 4.0–10.5)
nRBC: 0.3 % — ABNORMAL HIGH (ref 0.0–0.2)

## 2019-10-27 LAB — GLUCOSE, CAPILLARY
Glucose-Capillary: 168 mg/dL — ABNORMAL HIGH (ref 70–99)
Glucose-Capillary: 190 mg/dL — ABNORMAL HIGH (ref 70–99)
Glucose-Capillary: 224 mg/dL — ABNORMAL HIGH (ref 70–99)
Glucose-Capillary: 178 mg/dL — ABNORMAL HIGH (ref 70–99)
Glucose-Capillary: 159 mg/dL — ABNORMAL HIGH (ref 70–99)
Glucose-Capillary: 184 mg/dL — ABNORMAL HIGH (ref 70–99)

## 2019-10-27 LAB — POCT I-STAT 7, (LYTES, BLD GAS, ICA,H+H)
Acid-Base Excess: 1 mmol/L (ref 0.0–2.0)
Bicarbonate: 28.3 mmol/L — ABNORMAL HIGH (ref 20.0–28.0)
Calcium, Ion: 1.15 mmol/L (ref 1.15–1.40)
HCT: 34 % — ABNORMAL LOW (ref 39.0–52.0)
Hemoglobin: 11.6 g/dL — ABNORMAL LOW (ref 13.0–17.0)
O2 Saturation: 98 %
Patient temperature: 97.3
Potassium: 4.9 mmol/L (ref 3.5–5.1)
Sodium: 136 mmol/L (ref 135–145)
TCO2: 30 mmol/L (ref 22–32)
pCO2 arterial: 54.9 mmHg — ABNORMAL HIGH (ref 32.0–48.0)
pH, Arterial: 7.317 — ABNORMAL LOW (ref 7.350–7.450)
pO2, Arterial: 116 mmHg — ABNORMAL HIGH (ref 83.0–108.0)

## 2019-10-27 LAB — MAGNESIUM: Magnesium: 2.5 mg/dL — ABNORMAL HIGH (ref 1.7–2.4)

## 2019-10-27 LAB — PHOSPHORUS: Phosphorus: 1.6 mg/dL — ABNORMAL LOW (ref 2.5–4.6)

## 2019-10-27 LAB — HEPARIN LEVEL (UNFRACTIONATED): Heparin Unfractionated: 0.58 IU/mL (ref 0.30–0.70)

## 2019-10-27 MED ORDER — INSULIN DETEMIR 100 UNIT/ML ~~LOC~~ SOLN
10.0000 [IU] | Freq: Two times a day (BID) | SUBCUTANEOUS | Status: DC
  Administered 2019-10-27 – 2019-10-30 (×7): 10 [IU] via SUBCUTANEOUS
  Filled 2019-10-27 (×10): qty 0.1

## 2019-10-27 MED ORDER — SODIUM PHOSPHATES 45 MMOLE/15ML IV SOLN
45.0000 mmol | Freq: Once | INTRAVENOUS | Status: AC
  Administered 2019-10-27: 45 mmol via INTRAVENOUS
  Filled 2019-10-27: qty 15

## 2019-10-27 NOTE — Progress Notes (Signed)
Patient ID: Joshua Massey, male   DOB: 1976-05-07, 44 y.o.   MRN: [id number] S: No events overnight O:BP (!) 155/67   Pulse 64   Temp (!) 95.2 F (35.1 C)   Resp (!) 30   Ht _0  (1.803 m)   Wt (!) 153.3 kg   SpO2 100%   BMI 47.14 kg/m   Intake/Output Summary (Last 24 hours) at 10/27/2019 1123 Last data filed at 10/27/2019 1100 Gross per 24 hour  Intake 3405.73 ml  Output 5638 ml  Net -2232.27 ml   Intake/Output: I/O last 3 completed shifts: In: 5514 [I.V.:3544; NG/GT:1970] Out: 8868 [Urine:50; WUJWJ:1914; Stool:125]  Intake/Output this shift:  Total I/O In: 532.2 [I.V.:332.2; NG/GT:200] Out: 939 [Other:939] Weight change: 2.916 kg Physical exam: unable to complete due to COVID + status.  In order to preserve PPE equipment and to minimize exposure to providers.  Notes from other caregivers reviewed   Recent Labs  Lab 10/22/19 1626 10/22/19 1626 10/23/19 7829 10/23/19 0855 10/23/19 1630 10/24/19 0352 10/24/19 0454 10/24/19 5621 10/24/19 1748 10/24/19 1748 10/25/19 0530 10/25/19 0531 10/25/19 1630 10/26/19 0550 10/26/19 0659 10/26/19 1131 10/26/19 1632 10/26/19 1830 10/27/19 0402 10/27/19 0530  NA 137   < > 137   < > 137   < > 138   < > 137   < > 135   < > 135 135 137 136 136 135 136  --   K 4.5   < > 4.3   < > 4.0   < > 3.8   < > 3.9   < > 4.1   < > 4.3 4.5 4.6 4.8 5.0 5.0 4.9  --   CL 106   < > 104   < > 104  --  107  --  102  --  104  --  103 102  --   --   --  100  --   --   CO2 27   < > 27   < > 23  --  25  --  26  --  27  --  26 26  --   --   --  26  --   --   GLUCOSE 96   < > 89   < > 78  --  83  --  113*  --  123*  --  152* 243*  --   --   --  209*  --   --   BUN 26*   < > 21*   < > 21*  --  16  --  16  --  13  --  14 17  --   --   --  22*  --   --   CREATININE 2.16*   < > 2.09*   < > 2.12*  --  1.72*  --  1.78*  --  1.64*  --  1.66* 1.61*  --   --   --  1.69*  --   --   ALBUMIN 2.4*  --  2.3*  --  2.1*  --  2.0*  2.0*  --  1.7*  --   --   --  2.0*   --   --   --   --  2.2*  --   --   CALCIUM 7.5*   < > 7.7*   < > 7.5*  --  7.4*  --  6.8*  --  6.8*  --  7.2* 7.2*  --   --   --  7.3*  --   --   PHOS 2.7   < > 2.4*   < > 2.3*  --  1.3*  --  6.4*  --  2.5  --  2.4* 2.7  --   --   --  1.9*  --  1.6*  AST  --   --   --   --   --   --  18  --   --   --   --   --   --   --   --   --   --   --   --   --   ALT  --   --   --   --   --   --  12  --   --   --   --   --   --   --   --   --   --   --   --   --    < > = values in this interval not displayed.   Liver Function Tests: Recent Labs  Lab 10/24/19 0454 10/24/19 0454 10/24/19 1748 10/25/19 1630 10/26/19 1830  AST 18  --   --   --   --   ALT 12  --   --   --   --   ALKPHOS 70  --   --   --   --   BILITOT 1.5*  --   --   --   --   PROT 6.2*  --   --   --   --   ALBUMIN 2.0*  2.0*   < > 1.7* 2.0* 2.2*   < > = values in this interval not displayed.   No results for input(s): LIPASE, AMYLASE in the last 168 hours. No results for input(s): AMMONIA in the last 168 hours. CBC: Recent Labs  Lab 10/23/19 0614 10/23/19 0855 10/24/19 0454 10/24/19 0634 10/25/19 0530 10/25/19 0531 10/26/19 0550 10/26/19 0659 10/26/19 1632 10/27/19 0402 10/27/19 0530  WBC 7.4   < > 6.8   < > 5.8  --  11.1*  --   --   --  9.2  HGB 9.8*   < > 10.4*   < > 9.8*   < > 10.7*   < > 11.9* 11.6* 9.9*  HCT 33.2*   < > 33.8*   < > 32.9*   < > 37.4*   < > 35.0* 34.0* 33.8*  MCV 101.8*  --  96.8  --  100.0  --  103.9*  --   --   --  103.0*  PLT 156   < > 174   < > 190  --  207  --   --   --  205   < > = values in this interval not displayed.   Cardiac Enzymes: No results for input(s): CKTOTAL, CKMB, CKMBINDEX, TROPONINI in the last 168 hours. CBG: Recent Labs  Lab 10/26/19 1606 10/26/19 1926 10/26/19 2331 10/27/19 0327 10/27/19 0810  GLUCAP 188* 200* 181* 168* 190*    Iron Studies: No results for input(s): IRON, TIBC, TRANSFERRIN, FERRITIN in the last 72 hours. Studies/Results: DG Chest Port 1  View  Result Date: 10/27/2019 CLINICAL DATA:  Hypoxia.  COVID-19 positive EXAM: PORTABLE CHEST 1 VIEW COMPARISON:  Oct 26, 2019 FINDINGS: Endotracheal tube tip is 4.3 cm above the carina. Nasogastric tube tip and side port are below the diaphragm. Right jugular catheter tip is at  the cavoatrial junction. Left jugular catheter tip is in the superior vena cava slightly beyond the junction with the left innominate vein. No pneumothorax. There is patchy airspace opacity throughout much of the right lung and to a slightly lesser extent throughout much of the left lung, particular in the left mid lung and left base regions. Small left pleural effusion present. Cardiomegaly with pulmonary vascularity normal, stable. No adenopathy. No bone lesions evident. IMPRESSION: Tube and catheter positions as described without pneumothorax. Essentially stable multifocal airspace opacity with stable cardiomegaly. Electronically Signed   By: Lowella Grip III M.D.   On: 10/27/2019 08:06   DG Chest Port 1 View  Result Date: 10/26/2019 CLINICAL DATA:  Respiratory failure/hypoxia.  COVID-19 positive. EXAM: PORTABLE CHEST 1 VIEW COMPARISON:  Oct 25, 2019 FINDINGS: Endotracheal tube tip is 3.3 cm above the carina. Left jugular catheter tip is in the superior vena cava. Right jugular catheter tip is at the cavoatrial junction. Nasogastric tube tip and side port are below the diaphragm. No pneumothorax. There is airspace opacity in the right upper lobe, slightly less pronounced than 1 day prior,, with somewhat less airspace opacity in the left upper lobe compared to 1 day prior. Atelectatic change noted in the left base with small left pleural effusion, stable. No new opacity evident. There is cardiomegaly with pulmonary vascularity within normal limits. No adenopathy. No bone lesions. IMPRESSION: Stable to the catheter positions without pneumothorax. Persistent airspace opacity in the right upper lobe with slight partial clearing  compared to 1 day prior. There has been partial but incomplete clearing of airspace opacity from the left upper lobe. There is stable atelectasis in the left base with small left pleural effusion. No new opacity. Stable cardiac enlargement. Electronically Signed   By: Lowella Grip III M.D.   On: 10/26/2019 08:23   . amiodarone  200 mg Per Tube Daily  . artificial tears  1 application Both Eyes X0X  . atorvastatin  10 mg Per Tube Q supper  . B-complex with vitamin C  1 tablet Per Tube Daily  . chlorhexidine gluconate (MEDLINE KIT)  15 mL Mouth Rinse BID  . Chlorhexidine Gluconate Cloth  6 each Topical Daily  . docusate  100 mg Per Tube BID  . feeding supplement (PRO-STAT SUGAR FREE 64)  60 mL Per Tube TID  . insulin aspart  0-15 Units Subcutaneous Q4H  . insulin detemir  5 Units Subcutaneous BID  . mouth rinse  15 mL Mouth Rinse 10 times per day  . methylPREDNISolone (SOLU-MEDROL) injection  40 mg Intravenous Q12H  . pantoprazole (PROTONIX) IV  40 mg Intravenous QHS  . polyethylene glycol  17 g Per Tube Daily  . sodium chloride flush  3 mL Intravenous Q12H    BMET    Component Value Date/Time   NA 136 10/27/2019 0402   NA 142 09/12/2016 0000   K 4.9 10/27/2019 0402   CL 100 10/26/2019 1830   CO2 26 10/26/2019 1830   GLUCOSE 209 (H) 10/26/2019 1830   BUN 22 (H) 10/26/2019 1830   BUN 58 (A) 09/12/2016 0000   CREATININE 1.69 (H) 10/26/2019 1830   CREATININE 2.63 (H) 12/20/2016 1613   CALCIUM 7.3 (L) 10/26/2019 1830   GFRNONAA 49 (L) 10/26/2019 1830   GFRAA 57 (L) 10/26/2019 1830   CBC    Component Value Date/Time   WBC 9.2 10/27/2019 0530   RBC 3.28 (L) 10/27/2019 0530   HGB 9.9 (L) 10/27/2019 0530   HCT 33.8 (  L) 10/27/2019 0530   PLT 205 10/27/2019 0530   MCV 103.0 (H) 10/27/2019 0530   MCH 30.2 10/27/2019 0530   MCHC 29.3 (L) 10/27/2019 0530   RDW 17.2 (H) 10/27/2019 0530   LYMPHSABS 0.5 (L) 11/27/2017 1422   MONOABS 0.7 11/27/2017 1422   EOSABS 0.1 11/27/2017  1422   BASOSABS 0.0 11/27/2017 1422    Assessment/Plan: 1. Respiratory arrest/AMS- s/p intubated 5/5 and sent for head CT that was without acute findings except paranasal sinus disease.His hypoxia persists and is more hypotensive now on pressors. I agree with Dr. Vaughan Browner, this is not likely related to volume as he has been able to UF over 16 liters since admission and is likely related to worsening covid pneumonia. Prognosis appears grim.  2. Shock- likely due to covid-19 infection but will decrease UF goal since he is now on pressors. 3. 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 He has had several episodes over the past year similar to this and usually due to noncompliance with his diuretics and follow up. He was started on a lasix drip on 10/15/19 without much improvement despite multiple doses of metolazone. 1. Started on CRRT 10/18/19 with marked improvement of volume and electrolytes. 2. 4K/2.5Ca for all fluids. 3. Will decreaseGoal UFto 0-50 ml/hr due to dropping BP'sandneed for levophed drip. 4. Non-tunneled HD catheter placed 10/18/19 5. Continue with CRRT 4. Acute on chronic combined diastolic and systolic CHF- due to noncompliance with diuretics. Significant anasarca and no response to lasix drip. Echo with EF 17-79% EF, RV systolic function severely reduced and grade III DD (restrictive). Plan as outlined above. 5. Acute hypoxic respiratory failure due to #2 and covid-19 Pneumonia-now on non-rebreather. Treatment per primary svc 6. Hypophosphatemia- due to CRRT. Phosphorus has been repletedIV several times with improvement but will dose again today.  7. Left charcot join with plantar ulcer- followed by Dr. Sharol Given 8. P. Atrial fib- on amio per Cardiology. 9. Anemia of CKD -started on Aranesp 40 mcg on 10/18/19. Continue to follow. 10. Medical noncompliance- ongoing issue, poor health  literacy and insight. 11. Disposition- poor overall prognosis. Not certain he would tolerate intermittent HD given biventricular heart failure and low bp's. He declined starting dialysisinitiallyand is not sure he would agree to long term dialysis.Recommend Palliative Careconsult to help set goals/limits of care.  Donetta Potts, MD [employer] [phone]

## 2019-10-27 NOTE — Progress Notes (Signed)
[facility] for IV Heparin Indication: atrial fibrillation  No Known Allergies  Patient Measurements: Height: 5\' 11"  (180.3 cm) Weight: (!) 153.3 kg (338 lb) IBW/kg (Calculated) : 75.3 Heparin Dosing Weight: 106 kg  Vital Signs: Temp: 95.2 F (35.1 C) (05/09 0759) BP: 155/67 (05/09 0759) Pulse Rate: 64 (05/09 0759)  Labs: Recent Labs    10/25/19 0530 10/25/19 0531 10/25/19 1630 10/26/19 0550 10/26/19 0659 10/26/19 1131 10/26/19 1225 10/26/19 1632 10/26/19 1632 10/26/19 1830 10/27/19 0402 10/27/19 0530  HGB 9.8*   < >  --  10.7*   < >   < >  --  11.9*   < >  --  11.6* 9.9*  HCT 32.9*   < >  --  37.4*   < >   < >  --  35.0*  --   --  34.0* 33.8*  PLT 190  --   --  207  --   --   --   --   --   --   --  205  HEPARINUNFRC  --    < > 0.54  --   --   --  0.93*  --   --  0.66  --  0.58  CREATININE 1.64*   < > 1.66* 1.61*  --   --   --   --   --  1.69*  --   --    < > = values in this interval not displayed.    Estimated Creatinine Clearance: 85.8 mL/min (A) (by C-G formula based on SCr of 1.69 mg/dL (H)).   Assessment: 44 yr old male presented with severe fluid overload and was found to have COVID. He is on apixaban PTA for hx a fib (last dose on 4/27@1407 ). Pharmacy was consulted to transition pt from apixaban to IV heparin.  Heparin level therapeutic at 0.58  Goal of Therapy:  Heparin level 0.3-0.7 units/ml aPTT 66-102 seconds Monitor platelets by anticoagulation protocol: Yes   Plan:  - Continue heparin at 1700 units / hr - Will monitor daily heparin level and CBC - Will continue to monitor for any signs/symptoms of bleeding   Nicoletta Dress, PharmD PGY2 Infectious Disease Pharmacy Resident  10/27/2019 9:55 AM   **Pharmacist phone directory can now be found on [email or web address] ([email or web address]).  Listed under [place].

## 2019-10-27 NOTE — Progress Notes (Addendum)
NAME:  Joshua Massey, MRN:  [id number], DOB:  February 08, 1976, LOS: 12 ADMISSION DATE:  10/10/2019, CONSULTATION DATE: 10/23/2019 REFERRING MD: Dr. Waldron Labs, CHIEF COMPLAINT: Acute respiratory failure  Brief History   44 year old man.  Acute renal failure and acute respiratory failure due to acute on chronic systolic and diastolic CHF, massive overload in the setting of medical noncompliance.  COVID-19 positive, consider impact of Covid pneumonitis.  CVVH started 4/30.  Intubated 5/5 after he became acutely unresponsive, hypoxic found without oxygen in place.  Past Medical History     has a past medical history of CHF (congestive heart failure) ([place]), Chronic kidney disease, Diabetes mellitus, Exogenous obesity, GERD (gastroesophageal reflux disease), Hypertension, Left foot infection (06/21/2018), and Osteomyelitis of foot, left, acute ([place]) (07/04/2018).  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  No acute findings.  Micro Data:  SARS CoV2 4/27 >> positive Influenza A/B 4/27 >> negative MRSA screen 4/28 >> negative Blood 4/30 >>   Antimicrobials/COVID rx  Remdesivir 4/29 >> 5/3 Solumedrol 4/29-5/2, restart 5/7 >> Tocilizumab 5/6, 5/7  Interim history/subjective:   No acute events.  Remains on CVVH with volume removal  Objective    Blood pressure (!) 155/69, pulse 75, temperature (!) 95.7 F (35.4 C), resp. rate (!) 30, height 5\' 11"  (1.803 m), weight (!) 153.3 kg, SpO2 99 %. CVP:  [13 mmHg-22 mmHg] 21 mmHg  Vent Mode: PRVC FiO2 (%):  [50 %-80 %] 50 % Set Rate:  [30 bmp] 30 bmp Vt Set:  [450 mL] 450 mL PEEP:  [20 cmH20-22 cmH20] 20 cmH20 Plateau Pressure:  [31 cmH20-36 cmH20] 33 cmH20   Intake/Output Summary (Last 24 hours) at 10/27/2019 1517 Last data filed at 10/27/2019 1400 Gross per 24 hour  Intake 2803.85 ml  Output 5656 ml  Net -2852.15 ml   Filed Weights   10/25/19 0500 10/26/19 0500 10/27/19 0345  Weight: (!) 150.8 kg (!) 150.4 kg (!) 153.3 kg    Examination: Gen:      No acute distress, obese HEENT:  EOMI, sclera anicteric, ET tube Neck:     No masses; no thyromegaly Lungs:    Clear to auscultation bilaterally; normal respiratory effort CV:         Regular rate and rhythm; no murmurs Abd:      + bowel sounds; soft, non-tender; no palpable masses, no distension Ext:    2+ edema; adequate peripheral perfusion Skin:      Warm and dry; no rash Neuro: Sedated, unresponsive  Labs significant for glucose 209, BUN/creatinine 22/1.69 Hemoglobin slightly down to 9.9 Chest x-ray 5/9-stable bilateral airspace opacities, cardiomegaly.  Resolved Hospital Problem list     Assessment & Plan:  Acute hypoxemic respiratory failure, suspect multifactorial in the setting of bilateral pulmonary infiltrates.  Contribution of combined systolic and diastolic acute CHF as well as possible Covid pneumonitis.  Suspect his acute decompensation on 5/5 is more due to progressive COVID-19 pneumonia. 6cc/kg low tidal volume ventilation Oxygen not improved with proning but better with elevated PEEP Keep PEEP at 20 as he requires higher pressures due to obesity.  Wean down oxygen. Low suspicion for superimposed infection as procalcitonin is low. Will stop the paralytics today  Acute hypoxemic encephalopathy.  Suspect due  to his acute hypoxemia.  CT head is unremarkable Continue sedation protocol Lexapro, neurontin held  Acute on chronic (stage III) renal failure Continues on CVVH Note that given his medical noncompliance likely a poor candidate for long-term hemodialysis.   Acute on chronic systolic and diastolic CHF with severe volume overload.  Associated severe RV dysfunction and right heart failure Hypertension Holding carvedilol, imdur  Atrial fibrillation On heparin infusion Continue amiodarone  Hyperglycemia Sliding-scale insulin as ordered  Hyperlipidemia Atorvastatin as ordered  Anemia of chronic disease Follow CBC  Left plantar ulcer, Charcot foot. No signs of cellulitis or osteomyelitis by MRI Appreciate Dr. Jess Barters assistance, plan to continue wound care and follow with him as an outpatient assuming no acute changes  Hyperglycemia Continue SSI Increase Levemir to 10 units twice daily.  Best practice:  Diet: NPO, Tube feeds Pain/Anxiety/Delirium protocol (if indicated): Fentanyl infusion, Versed as needed VAP protocol (if indicated): In place DVT prophylaxis: Therapeutic heparin GI prophylaxis: Protonix IV Glucose control: SSI, Levemir Mobility:BR Code Status: Full Family Communication: Family updated. Disposition: ICU  Critical care time:     The patient is critically ill with multiple organ system failure and requires high complexity decision making for assessment and support, frequent evaluation and titration of therpies, advanced monitoring, review of radiographic studies and interpretation of complex data.   Critical Care Time devoted to patient care services, exclusive of separately billable procedures, described in this note is 35 minutes.   [name] MD [facility] Please see [email or web address] for pager details.  10/27/2019, 3:17 PM

## 2019-10-28 LAB — POCT I-STAT 7, (LYTES, BLD GAS, ICA,H+H)
Acid-Base Excess: 2 mmol/L (ref 0.0–2.0)
Bicarbonate: 28 mmol/L (ref 20.0–28.0)
Calcium, Ion: 1.16 mmol/L (ref 1.15–1.40)
HCT: 34 % — ABNORMAL LOW (ref 39.0–52.0)
Hemoglobin: 11.6 g/dL — ABNORMAL LOW (ref 13.0–17.0)
O2 Saturation: 91 %
Patient temperature: 35.5
Potassium: 4.9 mmol/L (ref 3.5–5.1)
Sodium: 136 mmol/L (ref 135–145)
TCO2: 29 mmol/L (ref 22–32)
pCO2 arterial: 46.5 mmHg (ref 32.0–48.0)
pH, Arterial: 7.38 (ref 7.350–7.450)
pO2, Arterial: 59 mmHg — ABNORMAL LOW (ref 83.0–108.0)

## 2019-10-28 LAB — RENAL FUNCTION PANEL
Albumin: 2.2 g/dL — ABNORMAL LOW (ref 3.5–5.0)
Albumin: 2.4 g/dL — ABNORMAL LOW (ref 3.5–5.0)
Anion gap: 9 (ref 5–15)
Anion gap: 10 (ref 5–15)
BUN: 26 mg/dL — ABNORMAL HIGH (ref 6–20)
BUN: 29 mg/dL — ABNORMAL HIGH (ref 6–20)
CO2: 27 mmol/L (ref 22–32)
CO2: 24 mmol/L (ref 22–32)
Calcium: 7.6 mg/dL — ABNORMAL LOW (ref 8.9–10.3)
Calcium: 7.6 mg/dL — ABNORMAL LOW (ref 8.9–10.3)
Chloride: 102 mmol/L (ref 98–111)
Chloride: 99 mmol/L (ref 98–111)
Creatinine, Ser: 1.51 mg/dL — ABNORMAL HIGH (ref 0.61–1.24)
Creatinine, Ser: 1.5 mg/dL — ABNORMAL HIGH (ref 0.61–1.24)
GFR calc Af Amer: >60 mL/min (ref >60)
GFR calc Af Amer: >60 mL/min
GFR calc non Af Amer: 56 mL/min — ABNORMAL LOW (ref >60)
GFR calc non Af Amer: 57 mL/min — ABNORMAL LOW
Glucose, Bld: 191 mg/dL — ABNORMAL HIGH (ref 70–99)
Glucose, Bld: 208 mg/dL — ABNORMAL HIGH (ref 70–99)
Phosphorus: 2 mg/dL — ABNORMAL LOW (ref 2.5–4.6)
Phosphorus: 2.4 mg/dL — ABNORMAL LOW (ref 2.5–4.6)
Potassium: 4.9 mmol/L (ref 3.5–5.1)
Potassium: 5.2 mmol/L — ABNORMAL HIGH (ref 3.5–5.1)
Sodium: 138 mmol/L (ref 135–145)
Sodium: 133 mmol/L — ABNORMAL LOW (ref 135–145)

## 2019-10-28 LAB — GLUCOSE, CAPILLARY
Glucose-Capillary: 165 mg/dL — ABNORMAL HIGH (ref 70–99)
Glucose-Capillary: 165 mg/dL — ABNORMAL HIGH (ref 70–99)
Glucose-Capillary: 200 mg/dL — ABNORMAL HIGH (ref 70–99)
Glucose-Capillary: 186 mg/dL — ABNORMAL HIGH (ref 70–99)
Glucose-Capillary: 216 mg/dL — ABNORMAL HIGH (ref 70–99)

## 2019-10-28 LAB — MAGNESIUM: Magnesium: 2.4 mg/dL (ref 1.7–2.4)

## 2019-10-28 LAB — HEPARIN LEVEL (UNFRACTIONATED): Heparin Unfractionated: 0.55 IU/mL (ref 0.30–0.70)

## 2019-10-28 MED ORDER — SODIUM PHOSPHATES 45 MMOLE/15ML IV SOLN
30.0000 mmol | Freq: Once | INTRAVENOUS | Status: AC
  Administered 2019-10-28: 30 mmol via INTRAVENOUS
  Filled 2019-10-28: qty 10

## 2019-10-28 NOTE — Progress Notes (Signed)
NAME:  Joshua Massey, MRN:  [id number], DOB:  20-Dec-1975, LOS: 31 ADMISSION DATE:  10/08/2019, CONSULTATION DATE: 10/23/2019 REFERRING MD: Dr. Waldron Labs, CHIEF COMPLAINT: Acute respiratory failure  Brief History   44 year old man.  Acute renal failure and acute respiratory failure due to acute on chronic systolic and diastolic CHF, massive overload in the setting of medical noncompliance.  COVID-19 positive, consider impact of Covid pneumonitis.  CVVH started 4/30.  Intubated 5/5 after he became acutely unresponsive, hypoxic found without oxygen in place.  Past Medical History     has a past medical history of CHF (congestive heart failure) ([place]), Chronic kidney disease, Diabetes mellitus, Exogenous obesity, GERD (gastroesophageal reflux disease), Hypertension, Left foot infection (06/21/2018), and Osteomyelitis of foot, left, acute ([place]) (07/04/2018).  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  No acute findings.  Micro Data:  SARS CoV2 4/27 >> positive Influenza A/B 4/27 >> negative MRSA screen 4/28 >> negative Blood 4/30 >>   Antimicrobials/COVID rx  Remdesivir 4/29 >> 5/3 Solumedrol 4/29-5/2, restart 5/7 >> Tocilizumab 5/6, 5/7  Interim history/subjective:  Remains on CVVHD Paralytics were lifted on 5/9 Currently on  fentanyl 300, Versed 6 FiO2 has been weaned to 0.40, remains on high PEEP, 20   Objective   Blood pressure (!) 141/92, pulse 64, temperature (!) 97.2 F (36.2 C), resp. rate (!) 30, height 5\' 11"  (1.803 m), weight (!) 147 kg, SpO2 95 %. CVP:  [15 mmHg-20 mmHg] 20 mmHg  Vent Mode: PRVC FiO2 (%):  [40 %-60 %] 40 % Set Rate:  [30 bmp] 30 bmp Vt Set:  [450 mL] 450 mL PEEP:  [16 cmH20-20 cmH20] 16 cmH20 Plateau Pressure:  [26 cmH20-33 cmH20] 26 cmH20   Intake/Output Summary (Last 24 hours) at 10/28/2019 1039 Last data filed at 10/28/2019 1000 Gross per 24 hour  Intake 3119.77 ml  Output 5513 ml  Net -2393.23 ml   Filed Weights   10/26/19 0500 10/27/19 0345 10/28/19 0337  Weight: (!) 150.4 kg (!) 153.3 kg (!) 147 kg    Examination: Gen:   Ill-appearing obese man, no distress HEENT: ET tube in good position, no icterus Neck:    No upper airway noise Lungs:   Distant, clear, normal effort CV:      Regular, distant, no murmur Abd:   Nondistended with positive bowel sounds Ext: Lower extremities are wrapped, 1+ edema Skin:    No apparent rash Neuro: Opens eyes to voice, nodded to questions even on high-dose sedation    Resolved Hospital Problem list     Assessment & Plan:  Acute hypoxemic respiratory failure, suspect multifactorial in the setting of bilateral pulmonary infiltrates.  Contribution of combined systolic and diastolic acute CHF as well as possible Covid pneumonitis.   Suspect his acute decompensation on 5/5 is more due to progressive COVID-19 pneumonia. Continue current PRVC 6 cc/kg  Benefited significantly from increase PEEP.  Plan to try to start weaning 5/10, follow oxygenation as he is at risk for derecruitment Off paralytics and tolerating Slow steroid taper  Acute hypoxemic encephalopathy.  Suspect due to his acute hypoxemia.  CT head is unremarkable Begin to wean fentanyl and Versed/10 Lexapro held   Acute on chronic (stage III) renal failure Remains on  CVVHD and tolerating UF.  Has been 100-150CC/h negative, net -22.6 L Note that given his medical noncompliance likely a poor candidate for long-term hemodialysis.   Acute on chronic systolic and diastolic CHF with severe volume overload.  Associated severe RV dysfunction and right heart failure Hypertension Continue to hold carvedilol, imdur  Atrial fibrillation Continue heparin infusion Continue amiodarone as ordered  Hyperglycemia Sliding-scale insulin as ordered Levemir 10 units twice daily  Hyperlipidemia Atorvastatin  Anemia of chronic disease Follow CBC  Left plantar ulcer, Charcot foot. No signs of cellulitis or osteomyelitis by MRI Appreciate Dr. Jess Barters assistance, plan to continue wound care and follow with him as an outpatient assuming no acute changes  Best practice:  Diet: NPO, Tube feeds Pain/Anxiety/Delirium protocol (if indicated): Fentanyl and Versed as infusion,  VAP protocol (if indicated): In place DVT prophylaxis: Therapeutic heparin GI prophylaxis: Protonix IV Glucose control: SSI, Levemir Mobility:BR Code Status: Full Family Communication: Updated pt's parents by phone 5/10 Disposition: ICU  Critical care time:     The patient is critically ill with multiple organ system failure and requires high complexity decision making for assessment and support, frequent evaluation and titration of therpies, advanced monitoring, review of radiographic studies and interpretation of complex data.   Critical Care Time devoted to patient care services, exclusive of separately billable procedures, described in this note is 33 minutes.    Baltazar Apo, MD, PhD 10/28/2019, 10:57 AM [facility] [phone] or if no answer [phone]

## 2019-10-28 NOTE — Progress Notes (Signed)
[place] for IV Heparin Indication: atrial fibrillation  No Known Allergies  Patient Measurements: Height: 5\' 11"  (180.3 cm) Weight: (!) 147 kg (324 lb) IBW/kg (Calculated) : 75.3 Heparin Dosing Weight: 106 kg  Vital Signs: Temp: 97.2 F (36.2 C) (05/10 0800) Temp Source: Oral (05/10 0749) BP: 138/91 (05/10 0800) Pulse Rate: 64 (05/10 0800)  Labs: Recent Labs    10/26/19 0550 10/26/19 0659 10/26/19 1632 10/26/19 1830 10/27/19 0402 10/27/19 0402 10/27/19 0530 10/27/19 1759 10/28/19 0339 10/28/19 0509  HGB 10.7*   < >   < >  --  11.6*   < > 9.9*  --   --  11.6*  HCT 37.4*   < >   < >  --  34.0*  --  33.8*  --   --  34.0*  PLT 207  --   --   --   --   --  205  --   --   --   HEPARINUNFRC  --    < >  --  0.66  --   --  0.58  --  0.55  --   CREATININE 1.61*   < >  --  1.69*  --   --   --  1.57* 1.51*  --    < > = values in this interval not displayed.    Estimated Creatinine Clearance: 93.7 mL/min (A) (by C-G formula based on SCr of 1.51 mg/dL (H)).   Assessment: 44 yr old male presented with severe fluid overload and was found to have COVID. He is on apixaban PTA for hx a fib (last dose on 4/27@1407 ). Pharmacy was consulted to transition pt from apixaban to IV heparin.  Heparin level therapeutic at 0.55  Goal of Therapy:  Heparin level 0.3-0.7 units/ml aPTT 66-102 seconds Monitor platelets by anticoagulation protocol: Yes   Plan:  - Continue heparin at 1700 units / hr - Will monitor daily heparin level and CBC - Will continue to monitor for any signs/symptoms of bleeding   Barth Kirks, PharmD, BCPS, BCCCP Clinical Pharmacist [phone]  Please check AMION for all [facility] numbers  10/28/2019 9:33 AM

## 2019-10-28 NOTE — Progress Notes (Signed)
Patient ID: Joshua Massey, male   DOB: January 10, 1976, 44 y.o.   MRN: [id number] S: No events overnight- removed 2700 with CRRT - no pressors   O:BP (!) 138/91   Pulse 64   Temp (!) 97.2 F (36.2 C)   Resp (!) 30   Ht 5' 11" (1.803 m)   Wt (!) 147 kg   SpO2 100%   BMI 45.19 kg/m   Intake/Output Summary (Last 24 hours) at 10/28/2019 0903 Last data filed at 10/28/2019 0800 Gross per 24 hour  Intake 2748.54 ml  Output 5415 ml  Net -2666.46 ml   Intake/Output: I/O last 3 completed shifts: In: 4600.3 [I.V.:2485.2; NG/GT:1800; IV Piggyback:315.1] Out: 8280 [KLKJZ:7915; Stool:25]  Intake/Output this shift:  Total I/O In: 53 [I.V.:53] Out: 220 [Other:220] Weight change: -6.35 kg Physical exam: unable to complete due to COVID + status.  In order to preserve PPE equipment and to minimize exposure to providers.  Notes from other caregivers reviewed- spoke to nurse, no concerns  vascath placed 4/30   Recent Labs  Lab 10/23/19 1630 10/24/19 0352 10/24/19 0454 10/24/19 0634 10/24/19 1748 10/24/19 1748 10/25/19 0530 10/25/19 0531 10/25/19 1630 10/25/19 1630 10/26/19 0550 10/26/19 0659 10/26/19 1131 10/26/19 1632 10/26/19 1830 10/27/19 0402 10/27/19 0530 10/27/19 1759 10/28/19 0339 10/28/19 0509  NA 137   < > 138   < > 137   < > 135   < > 135   < > 135   < > 136 136 135 136  --  136 138 136  K 4.0   < > 3.8   < > 3.9   < > 4.1   < > 4.3   < > 4.5   < > 4.8 5.0 5.0 4.9  --  4.9 4.9 4.9  CL 104  --  107   < > 102  --  104  --  103  --  102  --   --   --  100  --   --  103 102  --   CO2 23  --  25   < > 26  --  27  --  26  --  26  --   --   --  26  --   --  26 27  --   GLUCOSE 78  --  83   < > 113*  --  123*  --  152*  --  243*  --   --   --  209*  --   --  185* 191*  --   BUN 21*  --  16   < > 16  --  13  --  14  --  17  --   --   --  22*  --   --  24* 26*  --   CREATININE 2.12*  --  1.72*   < > 1.78*  --  1.64*  --  1.66*  --  1.61*  --   --   --  1.69*  --   --  1.57* 1.51*   --   ALBUMIN 2.1*  --  2.0*  2.0*  --  1.7*  --   --   --  2.0*  --   --   --   --   --  2.2*  --   --  2.2* 2.2*  --   CALCIUM 7.5*  --  7.4*   < > 6.8*  --  6.8*  --  7.2*  --  7.2*  --   --   --  7.3*  --   --  7.7* 7.6*  --   PHOS 2.3*  --  1.3*   < > 6.4*   < > 2.5  --  2.4*  --  2.7  --   --   --  1.9*  --  1.6* 2.6 2.0*  --   AST  --   --  18  --   --   --   --   --   --   --   --   --   --   --   --   --   --   --   --   --   ALT  --   --  12  --   --   --   --   --   --   --   --   --   --   --   --   --   --   --   --   --    < > = values in this interval not displayed.   Liver Function Tests: Recent Labs  Lab 10/24/19 0454 10/24/19 1748 10/26/19 1830 10/27/19 1759 10/28/19 0339  AST 18  --   --   --   --   ALT 12  --   --   --   --   ALKPHOS 70  --   --   --   --   BILITOT 1.5*  --   --   --   --   PROT 6.2*  --   --   --   --   ALBUMIN 2.0*  2.0*   < > 2.2* 2.2* 2.2*   < > = values in this interval not displayed.   No results for input(s): LIPASE, AMYLASE in the last 168 hours. No results for input(s): AMMONIA in the last 168 hours. CBC: Recent Labs  Lab 10/23/19 0614 10/23/19 0855 10/24/19 0454 10/24/19 0634 10/25/19 0530 10/25/19 0531 10/26/19 0550 10/26/19 0659 10/27/19 0402 10/27/19 0530 10/28/19 0509  WBC 7.4   < > 6.8   < > 5.8  --  11.1*  --   --  9.2  --   HGB 9.8*   < > 10.4*   < > 9.8*   < > 10.7*   < > 11.6* 9.9* 11.6*  HCT 33.2*   < > 33.8*   < > 32.9*   < > 37.4*   < > 34.0* 33.8* 34.0*  MCV 101.8*  --  96.8  --  100.0  --  103.9*  --   --  103.0*  --   PLT 156   < > 174   < > 190  --  207  --   --  205  --    < > = values in this interval not displayed.   Cardiac Enzymes: No results for input(s): CKTOTAL, CKMB, CKMBINDEX, TROPONINI in the last 168 hours. CBG: Recent Labs  Lab 10/27/19 1548 10/27/19 1936 10/27/19 2318 10/28/19 0333 10/28/19 0743  GLUCAP 178* 159* 184* 165* 165*    Iron Studies: No results for input(s): IRON,  TIBC, TRANSFERRIN, FERRITIN in the last 72 hours. Studies/Results: DG Chest Port 1 View  Result Date: 10/27/2019 CLINICAL DATA:  Hypoxia.  COVID-19 positive EXAM: PORTABLE CHEST 1 VIEW COMPARISON:  Oct 26, 2019 FINDINGS: Endotracheal tube tip is 4.3 cm  above the carina. Nasogastric tube tip and side port are below the diaphragm. Right jugular catheter tip is at the cavoatrial junction. Left jugular catheter tip is in the superior vena cava slightly beyond the junction with the left innominate vein. No pneumothorax. There is patchy airspace opacity throughout much of the right lung and to a slightly lesser extent throughout much of the left lung, particular in the left mid lung and left base regions. Small left pleural effusion present. Cardiomegaly with pulmonary vascularity normal, stable. No adenopathy. No bone lesions evident. IMPRESSION: Tube and catheter positions as described without pneumothorax. Essentially stable multifocal airspace opacity with stable cardiomegaly. Electronically Signed   By: Lowella Grip III M.D.   On: 10/27/2019 08:06   . amiodarone  200 mg Per Tube Daily  . artificial tears  1 application Both Eyes B4W  . atorvastatin  10 mg Per Tube Q supper  . B-complex with vitamin C  1 tablet Per Tube Daily  . chlorhexidine gluconate (MEDLINE KIT)  15 mL Mouth Rinse BID  . Chlorhexidine Gluconate Cloth  6 each Topical Daily  . docusate  100 mg Per Tube BID  . feeding supplement (PRO-STAT SUGAR FREE 64)  60 mL Per Tube TID  . insulin aspart  0-15 Units Subcutaneous Q4H  . insulin detemir  10 Units Subcutaneous BID  . mouth rinse  15 mL Mouth Rinse 10 times per day  . methylPREDNISolone (SOLU-MEDROL) injection  40 mg Intravenous Q12H  . pantoprazole (PROTONIX) IV  40 mg Intravenous QHS  . polyethylene glycol  17 g Per Tube Daily  . sodium chloride flush  3 mL Intravenous Q12H    BMET    Component Value Date/Time   NA 136 10/28/2019 0509   NA 142 09/12/2016 0000   K 4.9  10/28/2019 0509   CL 102 10/28/2019 0339   CO2 27 10/28/2019 0339   GLUCOSE 191 (H) 10/28/2019 0339   BUN 26 (H) 10/28/2019 0339   BUN 58 (A) 09/12/2016 0000   CREATININE 1.51 (H) 10/28/2019 0339   CREATININE 2.63 (H) 12/20/2016 1613   CALCIUM 7.6 (L) 10/28/2019 0339   GFRNONAA 56 (L) 10/28/2019 0339   GFRAA >60 10/28/2019 0339   CBC    Component Value Date/Time   WBC 9.2 10/27/2019 0530   RBC 3.28 (L) 10/27/2019 0530   HGB 11.6 (L) 10/28/2019 0509   HCT 34.0 (L) 10/28/2019 0509   PLT 205 10/27/2019 0530   MCV 103.0 (H) 10/27/2019 0530   MCH 30.2 10/27/2019 0530   MCHC 29.3 (L) 10/27/2019 0530   RDW 17.2 (H) 10/27/2019 0530   LYMPHSABS 0.5 (L) 11/27/2017 1422   MONOABS 0.7 11/27/2017 1422   EOSABS 0.1 11/27/2017 1422   BASOSABS 0.0 11/27/2017 1422    Assessment/Plan: 1. Respiratory arrest/AMS- s/p intubated 5/5 and sent for head CT that was without acute findings.His hypoxia persists.  is likely related to worsening covid pneumonia.  2. Shock- likely due to covid-19 infection - UF as able 1. 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 He has had several episodes over the past year similar to this and usually due to noncompliance with his diuretics and follow up. Started on CRRT 10/18/19 with marked improvement of volume and electrolytes. 2. 4K/2.5Ca for all fluids. 3. Will changeGoal UFto 100-150 ml/hr due to higher CVP-  Giving heparin 4. Non-tunneled HD catheter placed 10/18/19 5. Continue with CRRTdue to catabolic  state, volume and just overall critical illness 3. Acute on chronic combined diastolic and systolic CHF- due to noncompliance with diuretics.  Echo with EF 96-29% EF, RV systolic function severely reduced and grade III DD (restrictive).   4. Hypophosphatemia- due to CRRT. Phosphorus has been repletedIV several times with improvement but will dose again today.  5. Left  charcot join with plantar ulcer- followed by Dr. Sharol Given 6. P. Atrial fib- on amio per Cardiology. 7. Anemia of CKD -started on Aranesp 40 mcg on 10/18/19. Continue to follow. 8. Medical noncompliance- ongoing issue, poor health literacy and insight. 9. Disposition- poor overall prognosis. Not certain he would tolerate intermittent HD given biventricular heart failure and low bp's. He declined starting dialysisinitiallyand is not sure he would agree to long term dialysis.   Louis Meckel  [employer] [phone]

## 2019-10-29 ENCOUNTER — Inpatient Hospital Stay (HOSPITAL_COMMUNITY): Payer: BC Managed Care – PPO

## 2019-10-29 DIAGNOSIS — N183 Chronic kidney disease, stage 3 unspecified: Secondary | ICD-10-CM

## 2019-10-29 LAB — CBC
HCT: 33.1 % — ABNORMAL LOW (ref 39.0–52.0)
Hemoglobin: 9.9 g/dL — ABNORMAL LOW (ref 13.0–17.0)
MCH: 29.9 pg (ref 26.0–34.0)
MCHC: 29.9 g/dL — ABNORMAL LOW (ref 30.0–36.0)
MCV: 100 fL (ref 80.0–100.0)
Platelets: 187 10*3/uL (ref 150–400)
RBC: 3.31 MIL/uL — ABNORMAL LOW (ref 4.22–5.81)
RDW: 16.9 % — ABNORMAL HIGH (ref 11.5–15.5)
WBC: 9.2 10*3/uL (ref 4.0–10.5)
nRBC: 0.3 % — ABNORMAL HIGH (ref 0.0–0.2)

## 2019-10-29 LAB — RENAL FUNCTION PANEL
Albumin: 2.4 g/dL — ABNORMAL LOW (ref 3.5–5.0)
Albumin: 2.4 g/dL — ABNORMAL LOW (ref 3.5–5.0)
Anion gap: 7 (ref 5–15)
Anion gap: 6 (ref 5–15)
BUN: 32 mg/dL — ABNORMAL HIGH (ref 6–20)
BUN: 33 mg/dL — ABNORMAL HIGH (ref 6–20)
CO2: 27 mmol/L (ref 22–32)
CO2: 27 mmol/L (ref 22–32)
Calcium: 7.9 mg/dL — ABNORMAL LOW (ref 8.9–10.3)
Calcium: 8.1 mg/dL — ABNORMAL LOW (ref 8.9–10.3)
Chloride: 102 mmol/L (ref 98–111)
Chloride: 103 mmol/L (ref 98–111)
Creatinine, Ser: 1.59 mg/dL — ABNORMAL HIGH (ref 0.61–1.24)
Creatinine, Ser: 1.47 mg/dL — ABNORMAL HIGH (ref 0.61–1.24)
GFR calc Af Amer: >60 mL/min (ref >60)
GFR calc Af Amer: >60 mL/min (ref >60)
GFR calc non Af Amer: 53 mL/min — ABNORMAL LOW (ref >60)
GFR calc non Af Amer: 58 mL/min — ABNORMAL LOW (ref >60)
Glucose, Bld: 162 mg/dL — ABNORMAL HIGH (ref 70–99)
Glucose, Bld: 208 mg/dL — ABNORMAL HIGH (ref 70–99)
Phosphorus: 1.9 mg/dL — ABNORMAL LOW (ref 2.5–4.6)
Phosphorus: 2.9 mg/dL (ref 2.5–4.6)
Potassium: 5 mmol/L (ref 3.5–5.1)
Potassium: 5 mmol/L (ref 3.5–5.1)
Sodium: 136 mmol/L (ref 135–145)
Sodium: 136 mmol/L (ref 135–145)

## 2019-10-29 LAB — GLUCOSE, CAPILLARY
Glucose-Capillary: 153 mg/dL — ABNORMAL HIGH (ref 70–99)
Glucose-Capillary: 155 mg/dL — ABNORMAL HIGH (ref 70–99)
Glucose-Capillary: 161 mg/dL — ABNORMAL HIGH (ref 70–99)
Glucose-Capillary: 164 mg/dL — ABNORMAL HIGH (ref 70–99)
Glucose-Capillary: 173 mg/dL — ABNORMAL HIGH (ref 70–99)
Glucose-Capillary: 200 mg/dL — ABNORMAL HIGH (ref 70–99)
Glucose-Capillary: 206 mg/dL — ABNORMAL HIGH (ref 70–99)

## 2019-10-29 LAB — HEPARIN LEVEL (UNFRACTIONATED)
Heparin Unfractionated: 0.24 IU/mL — ABNORMAL LOW (ref 0.30–0.70)
Heparin Unfractionated: 0.74 IU/mL — ABNORMAL HIGH (ref 0.30–0.70)

## 2019-10-29 MED ORDER — SODIUM PHOSPHATES 45 MMOLE/15ML IV SOLN
30.0000 mmol | Freq: Once | INTRAVENOUS | Status: AC
  Administered 2019-10-29: 30 mmol via INTRAVENOUS
  Filled 2019-10-29: qty 10

## 2019-10-29 NOTE — Progress Notes (Signed)
NAME:  Joshua Massey, MRN:  [id number], DOB:  05-14-1976, LOS: 66 ADMISSION DATE:  10/06/2019, CONSULTATION DATE: 10/23/2019 REFERRING MD: Dr. Waldron Labs, CHIEF COMPLAINT: Acute respiratory failure  Brief History   44 year old man.  Acute renal failure and acute respiratory failure due to acute on chronic systolic and diastolic CHF, massive overload in the setting of medical noncompliance.  COVID-19 positive, consider impact of Covid pneumonitis.  CVVH started 4/30.  Intubated 5/5 after he became acutely unresponsive, hypoxic found without oxygen in place.  Past Medical History     has a past medical history of CHF (congestive heart failure) ([place]), Chronic kidney disease, Diabetes mellitus, Exogenous obesity, GERD (gastroesophageal reflux disease), Hypertension, Left foot infection (06/21/2018), and Osteomyelitis of foot, left, acute ([place]) (07/04/2018).  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  No acute findings.  Micro Data:  SARS CoV2 4/27 >> positive Influenza A/B 4/27 >> negative MRSA screen 4/28 >> negative Blood 4/30 >>   Antimicrobials/COVID rx  Remdesivir 4/29 >> 5/3 Solumedrol 4/29-5/2, restart 5/7 >> Tocilizumab 5/6, 5/7  Interim history/subjective:  PEEP down to  16 Paralytics lifted, tolerated Fentanyl 300, Versed 8 Potassium 5.0   Objective   Blood pressure 131/79, pulse 69, temperature (!) (P) 97.5 F (36.4 C), resp. rate (!) 30, height 5\' 11"  (1.803 m), weight (!) 146.2 kg, SpO2 94 %. CVP:  [13 mmHg-22 mmHg] 13 mmHg  Vent Mode: PRVC FiO2 (%):  [40 %-50 %] 40 % Set Rate:  [30 bmp] 30 bmp Vt Set:  [450 mL] 450 mL PEEP:  [16 cmH20] 16 cmH20 Plateau Pressure:  [26 cmH20-30 cmH20] 29 cmH20   Intake/Output Summary (Last 24 hours) at 10/29/2019 4825 Last data filed at 10/29/2019 0700 Gross per 24 hour  Intake 2861.1 ml  Output 5876 ml  Net -3014.9 ml   Filed Weights   10/27/19 0345 10/28/19 0337 10/29/19 0500  Weight: (!) 153.3 kg (!) 147 kg (!) 146.2 kg    Examination: Gen:   Obese man, ill-appearing, no distress HEENT: ET tube in good position.  Has had some blood-tinged secretions from his mouth.  No clear bite injury to tongue Neck:    No upper airway noise Lungs: Distant, clear, normal effort CV:      Distant, regular, no murmur Abd:   Nondistended, positive bowel sounds Ext: Lower extremities wrapped, bilateral 1+ edema Skin:    No rash Neuro: Opens eyes to voice, nodded to questions on high-dose sedation    Resolved Hospital Problem list     Assessment & Plan:  Acute hypoxemic respiratory failure, suspect multifactorial in the setting of bilateral pulmonary infiltrates.  Contribution of combined systolic and diastolic acute CHF as well as possible Covid pneumonitis.   Suspect his acute decompensation on 5/5 is more due to progressive COVID-19 pneumonia given aggressive  volume removal Continue current PRVC 6 cc/kg Attempt to wean PEEP further on 5/11, tolerated down to 16 May be able to begin to lighten sedation Slow steroid taper  Acute hypoxemic encephalopathy.  Suspect due to his acute hypoxemia.  CT head is unremarkable Begin to slowly wean fentanyl, midazolam Lexapro held  Acute on chronic (stage III) renal  failure Remains on CVVHD, plan to continue UF.  Has been 100-150CC/h negative, net - 25.7 L Note that given his medical noncompliance likely a poor candidate for long-term hemodialysis.   Acute on chronic systolic and diastolic CHF with severe volume overload.  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 No signs of cellulitis or osteomyelitis by MRI Appreciate Dr. Jess Barters assistance, plan is to continue wound care and follow with him as an outpatient assuming no acute changes  Best practice:  Diet: NPO, Tube feeds Pain/Anxiety/Delirium protocol (if indicated): Fentanyl and Versed as infusion,  VAP protocol (if indicated): In place DVT prophylaxis: Therapeutic heparin GI prophylaxis: Protonix IV Glucose control: SSI, Levemir Mobility:BR Code Status: Full Family Communication: Updated pt's parents by phone 5/10 and 5/11 Disposition: ICU  Critical care time:     The patient is critically ill with multiple organ system failure and requires high complexity decision making for assessment and support, frequent evaluation and titration of therpies, advanced monitoring, review of radiographic studies and interpretation of complex data.   Critical Care Time devoted to patient care services, exclusive of separately billable procedures, described in this note is 32 minutes.    Baltazar Apo, MD, PhD 10/29/2019, 8:12 AM [facility] [phone] or if no answer [phone]

## 2019-10-29 NOTE — Consult Note (Signed)
Chief Complaint: AKI  Referring Physician(s): Dr. Moshe Cipro  Supervising Physician: Corrie Mckusick  Patient Status: [facility] - In-pt  History of Present Illness: Joshua Massey is a 44 y.o. male COVID +, COVID PNA, Respiratory arrest intubated History of CKD , a fib  (on eliquis) admitted for acute on chronic CHF found to be in AKI thought to be related to cardiorenal syndrome. Team is requesting HD catheter placement. Of note patient has a LIJ temp cath placed on 4.30.21 placed by critical care team. Vas cath is functional  Past Medical History:  Diagnosis Date  . 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 Chronic kidney disease   . Diabetes mellitus    adult-onset, type 2  . Exogenous obesity   . GERD (gastroesophageal reflux disease)   . Hypertension   . Left foot infection 06/21/2018  . Osteomyelitis of foot, left, acute ([place]) 07/04/2018    Past Surgical History:  Procedure Laterality Date  . ANKLE SURGERY  2003   plate and screws  . APPLICATION OF WOUND VAC Left 06/12/2018   Procedure: APPLICATION OF WOUND VAC;  Surgeon: Mcarthur Rossetti, MD;  Location: [place];  Service: Orthopedics;  Laterality: Left;  . CARDIAC CATHETERIZATION  02/16/2012   50% LAD lesion in mid vessel, otherwise no significant obstructive CAD  . HIP PINNING  1990   bilateral  . I & D EXTREMITY Left 06/10/2018   Procedure: IRRIGATION AND DEBRIDEMENT OF FOOT;  Surgeon: Mcarthur Rossetti, MD;  Location: [place];  Service: Orthopedics;  Laterality: Left;  . I & D EXTREMITY Left 06/12/2018   Procedure: REPEAT IRRIGATION AND DEBRIDEMENT LEFT FOOT;  Surgeon: Mcarthur Rossetti, MD;  Location: [place];  Service: Orthopedics;  Laterality: Left;  . I & D EXTREMITY Left 07/04/2018   Procedure: DEBRIDEMENT OF LEFT FOOT;  Surgeon: Newt Minion, MD;  Location: [place];  Service: Orthopedics;  Laterality: Left;  . IR GENERIC HISTORICAL  09/01/2016   IR FLUORO GUIDE CV LINE  RIGHT 09/01/2016 Marybelle Killings, MD [facility]-INTERV RAD  . IR GENERIC HISTORICAL  09/01/2016   IR US GUIDE VASC ACCESS RIGHT 09/01/2016 Marybelle Killings, MD [facility]  . IR GENERIC HISTORICAL  09/16/2016   IR REMOVAL TUN CV CATH W/O FL 09/16/2016 Arne Cleveland, MD [facility]  . KNEE ARTHROSCOPY Left 08/30/2016   Procedure: ARTHROSCOPY KNEE;  Surgeon: Renette Butters, MD;  Location: [place];  Service: Orthopedics;  Laterality: Left;  . LEFT AND RIGHT HEART CATHETERIZATION WITH CORONARY ANGIOGRAM N/A 02/16/2012   Procedure: LEFT AND RIGHT HEART CATHETERIZATION WITH CORONARY ANGIOGRAM;  Surgeon: Pixie Casino, MD;  Location: [facility] CATH LAB;  Service: Cardiovascular;  Laterality: N/A;  . TRANSTHORACIC ECHOCARDIOGRAM  08/31/2012   EF 35-40%, no significant wall motion abnormalities, diastolic relaxation abnormality - no longer needed LifeVest; LV cavity size mod dilated with mod conc hypertrophy, systolic function mod reduced; mild MR, LA mod dilated    Allergies: Patient has no known allergies.  Medications: Prior to Admission medications   Medication Sig Start Date End Date Taking? Authorizing Provider  acetaminophen (TYLENOL) 500 MG tablet Take 1,000 mg by mouth every 6 (six) hours as needed for mild pain.   Yes [provider]  apixaban (ELIQUIS) 5 MG TABS tablet Take 1 tablet (5 mg total) by mouth 2 (two) times daily. 11/06/18  Yes [name], [name], MD  atorvastatin (LIPITOR) 10 MG tablet Take 1 tablet (10 mg total) by mouth  daily with supper. Patient taking differently: Take 10 mg by mouth every morning.  08/07/18  Yes [name], [name], MD  carvedilol (COREG) 25 MG tablet TAKE 1 TABLET BY MOUTH TWICE DAILY WITH  A  MEAL. Patient taking differently: Take 25 mg by mouth 2 (two) times daily with a meal.  06/17/19  Yes [name], [name], MD  Ferrous Sulfate (IRON) 325 (65 Fe) MG TABS TAKE 1 TABLET BY MOUTH TWICE DAILY WITH MEALS  Patient taking differently: Take 325 mg by mouth 2 (two) times daily with a  meal.  07/12/19  Yes [name], [name], MD  gabapentin (NEURONTIN) 300 MG capsule Take 1 capsule (300 mg total) by mouth at bedtime. Patient taking differently: Take 300 mg by mouth at bedtime as needed (pain).  10/25/18  Yes Rayburn, Neta Mends, PA-C  insulin aspart (NOVOLOG FLEXPEN) 100 UNIT/ML FlexPen Inject 0-15 Units into the skin 3 (three) times daily with meals. 0-15U/Subcu/TIDw/meals//CBG70-120:0U/CBG121-150:2U/CBG151-200:3U/CBG201-250:5U/CBG251-300:8U/CBG301-350:11U/CBG351-400:15U//CBG>400:Call MD 11/14/18  Yes [name], [name], MD  Insulin Glargine (LANTUS) 100 UNIT/ML Solostar Pen Inject 5 Units into the skin at bedtime. Patient taking differently: Inject 1 Units into the skin at bedtime.  11/06/18  Yes [name], [name], MD  isosorbide-hydrALAZINE (BIDIL) 20-37.5 MG tablet Take 1 tablet by mouth 2 (two) times daily. 07/24/19  Yes Larey Dresser, MD  Multiple Vitamin (MULTIVITAMIN WITH MINERALS) TABS tablet Take 1 tablet by mouth daily. 09/07/18  Yes Bonnielee Haff, MD  silver sulfADIAZINE (SILVADENE) 1 % cream Apply topically daily. Silvadene plus dry dressing to plantar ulcer left foot.  Change daily. Patient taking differently: Apply 1 application topically daily as needed (skin care). Silvadene plus dry dressing to plantar ulcer left foot.  Change daily. 09/07/18  Yes Bonnielee Haff, MD  torsemide (DEMADEX) 100 MG tablet Take 1 tablet (100 mg total) by mouth daily. 07/24/19 10/15/19 Yes Larey Dresser, MD  Vitamin D, Ergocalciferol, (DRISDOL) 1.25 MG (50000 UT) CAPS capsule Take 1 capsule (50,000 Units total) by mouth every 7 (seven) days. Patient taking differently: Take 50,000 Units by mouth every 7 (seven) days. Friday 11/06/18  Yes [name], [name], MD  amiodarone (PACERONE) 200 MG tablet Take 1 tablet (200 mg total) by mouth daily. 06/04/19 07/04/19  Darliss Cheney, MD  Continuous Blood Gluc Receiver (FREESTYLE LIBRE 14 DAY READER) DEVI 1 Device by Does not apply route daily. 11/06/18    [name], [name], MD  Continuous Blood Gluc Sensor (FREESTYLE LIBRE 14 DAY SENSOR) MISC Inject 1 Device into the skin 4 (four) times daily as needed. 11/06/18   [name], [name], MD  glucose blood (FREESTYLE TEST STRIPS) test strip Use as instructed 09/02/16   Ghimire, Henreitta Leber, MD  glucose monitoring kit (FREESTYLE) monitoring kit 1 each by Does not apply route 4 (four) times daily - after meals and at bedtime. 1 month Diabetic Testing Supplies for QAC-QHS accuchecks. 09/02/16   Ghimire, Henreitta Leber, MD  Insulin Pen Needle 32G X 8 MM MISC Use as directed 09/02/16   Jonetta Osgood, MD  Lancets (FREESTYLE) lancets Use as instructed 09/02/16   Ghimire, Henreitta Leber, MD  oxyCODONE-acetaminophen (PERCOCET/ROXICET) 5-325 MG tablet Take 1 tablet by mouth every 8 (eight) hours as needed for moderate pain or severe pain. Patient not taking: Reported on 10/15/2019 10/25/18   Rayburn, Neta Mends, PA-C     Family History  Problem Relation Age of Onset  . Coronary artery disease Brother        CABG at 60, DM  .  Heart disease Brother 13       CAD  . Diabetes Mother   . Hypertension Mother   . Hyperlipidemia Mother   . Heart disease Mother   . Diabetes Father   . Hypertension Father     Social History   Socioeconomic History  . Marital status: Single    Spouse name: Not on file  . Number of children: 6  . Years of education: 64  . Highest education level: Not on file  Occupational History  . Occupation: Brewing technologist    Comment: [name]  . Occupation: newpaper delivery    Employer: [employer]  Tobacco Use  . Smoking status: Never Smoker  . Smokeless tobacco: Never Used  Substance and Sexual Activity  . Alcohol use: No  . Drug use: No  . Sexual activity: Yes    Birth control/protection: Condom  Other Topics Concern  . Not on file  Social History Narrative  . Not on file   Social Determinants of Health   Financial Resource Strain:   . Difficulty of Paying Living  Expenses:   Food Insecurity:   . Worried About Charity fundraiser in the Last Year:   . Arboriculturist in the Last Year:   Transportation Needs:   . Film/video editor (Medical):   Marland Kitchen Lack of Transportation (Non-Medical):   Physical Activity:   . Days of Exercise per Week:   . Minutes of Exercise per Session:   Stress:   . Feeling of Stress :   Social Connections:   . Frequency of Communication with Friends and Family:   . Frequency of Social Gatherings with Friends and Family:   . Attends Religious Services:   . Active Member of Clubs or Organizations:   . Attends Archivist Meetings:   Marland Kitchen Marital Status:      Review of Systems: A 12 point ROS discussed and pertinent positives are indicated in the HPI above.  All other systems are negative.  Review of Systems  Unable to perform ROS: Patient unresponsive    Vital Signs: BP 131/62   Pulse 72   Temp 98.6 F (37 C)   Resp (!) 30   Ht 5' 11"  (1.803 m)   Wt (!) 322 lb 5 oz (146.2 kg)   SpO2 91%   BMI 44.95 kg/m   Physical Exam Vitals and nursing note reviewed.  Constitutional:      Appearance: He is well-developed.  HENT:     Head: Normocephalic.  Pulmonary:     Comments: On ventilator Musculoskeletal:        General: Normal range of motion.     Cervical back: Normal range of motion.  Skin:    General: Skin is dry.     Comments: RIJ vas cath present and LIJ triple lumen catheter     Imaging: DG Abd 1 View  Result Date: 10/20/2019 CLINICAL DATA:  Abdominal distension EXAM: ABDOMEN - 1 VIEW COMPARISON:  None. FINDINGS: Examination limited by patient body habitus. No visible abnormality. Postsurgical changes of both femoral necks. IMPRESSION: Limited examination due to patient body habitus. No visible acute abnormality. Electronically Signed   By: Ulyses Jarred M.D.   On: 10/20/2019 04:53   CT HEAD WO CONTRAST  Result Date: 10/23/2019 CLINICAL DATA:  Encephalopathy, lethargy. On heparin. EXAM: CT  HEAD WITHOUT CONTRAST TECHNIQUE: Contiguous axial images were obtained from the base of the skull through the vertex without intravenous  contrast. COMPARISON:  None. FINDINGS: Brain: Ventricles are within normal limits in size. There is no mass, hemorrhage, edema or other evidence of acute parenchymal abnormality. No extra-axial hemorrhage. Vascular: Chronic calcified atherosclerotic changes of the large vessels at the skull base. No unexpected hyperdense vessel. Skull: Normal. Negative for fracture or focal lesion. Sinuses/Orbits: Scattered mucosal thickening within the ethmoid air cells and sphenoid sinus, of uncertain chronicity. Periorbital and retro-orbital soft tissues are unremarkable. Other: None. IMPRESSION: 1. No acute findings. No intracranial mass, hemorrhage or edema. 2. Paranasal sinus disease, of uncertain chronicity. Electronically Signed   By: Franki Cabot M.D.   On: 10/23/2019 13:52   MR FOOT LEFT WO CONTRAST  Result Date: 10/15/2019 CLINICAL DATA:  Bilateral lower extremity swelling, question of osteomyelitis ulcer on the bottom of the left forefoot EXAM: MRI OF THE LEFT FOOT WITHOUT CONTRAST TECHNIQUE: Multiplanar, multisequence MR imaging of the left was performed. No intravenous contrast was administered. COMPARISON:  Multiple radiographs, most recent radiograph same day FINDINGS: Bones/Joint/Cartilage Again noted are chronic erosive type changes seen at the base of the first MTP joint there is stable dorsal on lateral subluxation of the metatarsals at the TMT joint joints with erosive type changes and chronic disruption of the Lisfranc ligaments. There is small erosive type changes seen throughout the cuneiform. There is no acute marrow signal abnormality seen throughout the osseous structures however. No large joint effusions are present. Probable healed fracture deformity of the fifth metatarsal base seen. There is diffuse chronic periosteal reaction seen through the second through  fourth metatarsal shafts. Ligaments Chronic disruptions of the Lisfranc ligaments as described above. Muscles and Tendons Diffuse fatty atrophy with denervation seen throughout the forefoot. The flexor and extensor tendons appear to be grossly intact, however there is surrounding fluid signal on surrounding the flexor digitorum tendons. Soft tissues On the medial plantar forefoot there is areas of superficial ulceration with diffuse skin thickening and subcutaneous edema seen. No loculated fluid collections or sinus tract however are noted. There is diffuse dorsal subcutaneous edema seen on the dorsum of the forefoot. IMPRESSION: 1. 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 No definite evidence acute osteomyelitis. 2. Area superficial ulceration on the plantar base beneath the first metatarsal. No definite loculated fluid collection or sinus tract. 3. Diffuse subcutaneous edema and cellulitis surrounding the forefoot. Electronically Signed   By: Prudencio Pair M.D.   On: 10/15/2019 22:27   US Abdomen Limited  Result Date: 10/20/2019 CLINICAL DATA:  Ascites evaluation.  Obesity. EXAM: LIMITED ABDOMEN ULTRASOUND FOR ASCITES TECHNIQUE: Limited ultrasound survey for ascites was performed in all four abdominal quadrants. COMPARISON:  None. FINDINGS: Moderate ascites visualized in all 4 quadrants. IMPRESSION: Moderate ascites. Electronically Signed   By: Marin Olp M.D.   On: 10/20/2019 12:13   DG Chest Port 1 View  Result Date: 10/29/2019 CLINICAL DATA:  Acute respiratory failure. EXAM: PORTABLE CHEST 1 VIEW COMPARISON:  Chest x-ray dated Oct 27, 2019. FINDINGS: Unchanged endotracheal and enteric tubes. Unchanged bilateral internal jugular central venous catheters. Stable cardiomediastinal silhouette. Right greater than left airspace disease has slightly improved, particularly at the right lung base. No pleural effusion or pneumothorax. No  acute osseous abnormality. IMPRESSION: 1. Slightly improved multifocal pneumonia. Electronically Signed   By: Titus Dubin M.D.   On: 10/29/2019 06:45   DG Chest Port 1 View  Result Date: 10/27/2019 CLINICAL DATA:  Hypoxia.  COVID-19 positive EXAM: PORTABLE CHEST 1 VIEW  COMPARISON:  Oct 26, 2019 FINDINGS: Endotracheal tube tip is 4.3 cm above the carina. Nasogastric tube tip and side port are below the diaphragm. Right jugular catheter tip is at the cavoatrial junction. Left jugular catheter tip is in the superior vena cava slightly beyond the junction with the left innominate vein. No pneumothorax. There is patchy airspace opacity throughout much of the right lung and to a slightly lesser extent throughout much of the left lung, particular in the left mid lung and left base regions. Small left pleural effusion present. Cardiomegaly with pulmonary vascularity normal, stable. No adenopathy. No bone lesions evident. IMPRESSION: Tube and catheter positions as described without pneumothorax. Essentially stable multifocal airspace opacity with stable cardiomegaly. Electronically Signed   By: Lowella Grip III M.D.   On: 10/27/2019 08:06   DG Chest Port 1 View  Result Date: 10/26/2019 CLINICAL DATA:  Respiratory failure/hypoxia.  COVID-19 positive. EXAM: PORTABLE CHEST 1 VIEW COMPARISON:  Oct 25, 2019 FINDINGS: Endotracheal tube tip is 3.3 cm above the carina. Left jugular catheter tip is in the superior vena cava. Right jugular catheter tip is at the cavoatrial junction. Nasogastric tube tip and side port are below the diaphragm. No pneumothorax. There is airspace opacity in the right upper lobe, slightly less pronounced than 1 day prior,, with somewhat less airspace opacity in the left upper lobe compared to 1 day prior. Atelectatic change noted in the left base with small left pleural effusion, stable. No new opacity evident. There is cardiomegaly with pulmonary vascularity within normal limits. No  adenopathy. No bone lesions. IMPRESSION: Stable to the catheter positions without pneumothorax. Persistent airspace opacity in the right upper lobe with slight partial clearing compared to 1 day prior. There has been partial but incomplete clearing of airspace opacity from the left upper lobe. There is stable atelectasis in the left base with small left pleural effusion. No new opacity. Stable cardiac enlargement. Electronically Signed   By: Lowella Grip III M.D.   On: 10/26/2019 08:23   DG Chest Port 1 View  Result Date: 10/25/2019 CLINICAL DATA:  Hypoxia EXAM: PORTABLE CHEST 1 VIEW COMPARISON:  Oct 24, 2019 FINDINGS: Endotracheal tube tip is 4.3 cm above the carina. Nasogastric tube tip and side port are below the diaphragm. Central catheter tip is at the cavoatrial junction. No pneumothorax. There is airspace consolidation in both upper lobes, considerably more on the right than on the left. There is atelectatic change in the left base with small left pleural effusion. Heart is enlarged with pulmonary vascularity normal. No adenopathy. No bone lesions. IMPRESSION: Tube and catheter positions as described without pneumothorax. Airspace opacity in the upper lobes, considerably more on the right than the left, essentially stable. Stable small left pleural effusion with left base atelectasis. Stable cardiomegaly. Electronically Signed   By: Lowella Grip III M.D.   On: 10/25/2019 08:00   DG CHEST PORT 1 VIEW  Result Date: 10/24/2019 CLINICAL DATA:  PICC placement. Pulmonary infiltrates. Cardiomegaly. EXAM: PORTABLE CHEST 1 VIEW 12:24 p.m. COMPARISON:  10/24/2019 at 5:30 a.m. and 10/23/2019 FINDINGS: PICC tip is 6 cm below the carina at the cavoatrial junction. Endotracheal tube is 4.6 cm above the carina in good position. NG tube tip is below the diaphragm. Right-sided central venous catheter tip is in the upper right atrium, unchanged. Persistent unchanged interval traits on the right. Slight increased  infiltrate at the left lung base. Heart size and pulmonary vascularity are unchanged. No acute bone abnormality. No pneumothorax. IMPRESSION: 1.  PICC tip is 6 cm below the carina at the cavoatrial junction. 2. Persistent patchy infiltrates on the right. 3. Slight increased infiltrate at the left lung base. Electronically Signed   By: Lorriane Shire M.D.   On: 10/24/2019 12:38   DG Chest Port 1 View  Result Date: 10/24/2019 CLINICAL DATA:  Covid-19 positive. Intubated on 10/23/2019. EXAM: PORTABLE CHEST 1 VIEW COMPARISON:  10/23/2019 FINDINGS: Endotracheal tube is in place, tip approximately 2.5 centimeters above the carina. Nasogastric tube is in place, tip off the film beyond the gastroesophageal junction. RIGHT IJ central line tip overlies the superior vena cava. Stable cardiomegaly. Patchy opacity in the LEFT lung appears stable. More confluent opacity in the RIGHT lung, particularly within the RIGHT UPPER lobe and RIGHT mid lung zone, appears stable. IMPRESSION: 1. No significant change in bilateral airspace filling opacities. 2. Stable cardiomegaly. Electronically Signed   By: Nolon Nations M.D.   On: 10/24/2019 08:04   Portable Chest x-ray  Result Date: 10/23/2019 CLINICAL DATA:  Intubated EXAM: PORTABLE CHEST 1 VIEW COMPARISON:  10/18/2019 chest radiograph. FINDINGS: Endotracheal tube tip is 2.8 cm above the carina. Enteric tube enters stomach with the tip not seen on this image. Right internal jugular central venous catheter terminates over cavoatrial junction. Stable cardiomediastinal silhouette with moderate cardiomegaly. No pneumothorax. No pleural effusion. Low lung volumes. Extensive patchy opacities throughout the right greater than left lungs, slightly worsened on the right. IMPRESSION: 1. Well-positioned support structures. No pneumothorax. 2. Extensive patchy opacities throughout the right greater than left lungs, slightly worsened on the right, compatible with multifocal pneumonia versus  asymmetric severe pulmonary edema given the moderate cardiomegaly. Electronically Signed   By: Ilona Sorrel M.D.   On: 10/23/2019 10:06   DG Chest Port 1 View  Result Date: 10/18/2019 CLINICAL DATA:  Central line placement. EXAM: PORTABLE CHEST 1 VIEW COMPARISON:  Same day. FINDINGS: Stable cardiomegaly. Stable bilateral lung opacities are noted consistent with multifocal pneumonia. Hypoinflation of the lungs is noted. No pneumothorax or pleural effusion is noted. Right internal jugular catheter is noted with tip in expected position of right atrium. Bony thorax is unremarkable. IMPRESSION: Stable bilateral lung opacities are noted consistent with multifocal pneumonia. Hypoinflation of the lungs is noted. Right internal jugular catheter is noted with tip in expected position of right atrium. Electronically Signed   By: Marijo Conception M.D.   On: 10/18/2019 15:17   DG Chest Port 1 View  Result Date: 10/18/2019 CLINICAL DATA:  Shortness of breath. EXAM: PORTABLE CHEST 1 VIEW COMPARISON:  10/15/2019. FINDINGS: Severe cardiomegaly. Diffuse bilateral pulmonary infiltrates/edema. No pleural effusion or pneumothorax. No acute bony abnormality. Identified. IMPRESSION: Severe cardiomegaly.  Diffuse bilateral pulmonary infiltrates/edema. Electronically Signed   By: Marcello Moores  Register   On: 10/18/2019 08:35   DG Chest Port 1 View  Result Date: 10/15/2019 CLINICAL DATA:  Lower extremity edema EXAM: PORTABLE CHEST 1 VIEW COMPARISON:  05/22/2019 FINDINGS: Very low lung volumes limiting evaluation. Pulmonary vascular congestion with possible mild perihilar edema. No significant pleural effusion. No pneumothorax. Stable cardiomegaly. IMPRESSION: Pulmonary vascular congestion with possible mild edema. Electronically Signed   By: Macy Mis M.D.   On: 10/15/2019 09:11   DG Foot Complete Left  Result Date: 10/15/2019 CLINICAL DATA:  Nonhealing wound to left foot. EXAM: LEFT FOOT - COMPLETE 3+ VIEW COMPARISON:  August 29, 2018. FINDINGS: Vascular calcifications are noted. Continued chronic dislocations degenerative changes seen involving the tarsometatarsal joints. Stable defect of middle portion of second middle  phalanx is noted. Diffuse soft tissue swelling is noted. No acute lytic destruction is seen to suggest osteomyelitis. IMPRESSION: Stable chronic findings as described above. No acute lytic destruction is seen to suggest osteomyelitis. Diffuse soft tissue swelling is noted. Electronically Signed   By: Marijo Conception M.D.   On: 10/15/2019 09:30   ECHOCARDIOGRAM COMPLETE  Result Date: 10/15/2019    ECHOCARDIOGRAM REPORT   Patient Name:   Joshua Massey Date of Exam: 10/15/2019 Medical Rec #:  [id number]        Height:       71.0 in Accession #:    [id number]       Weight:       299.8 lb Date of Birth:  07-29-1975        BSA:          2.504 m Patient Age:    70 years         BP:           144/99 mmHg Patient Gender: M                HR:           92 bpm. Exam Location:  Inpatient Procedure: 2D Echo, Cardiac Doppler and Color Doppler Indications:    CHF-Acute Systolic 916.94/H03.88  History:        Patient has prior history of Echocardiogram examinations. CHF,                 CAD; Risk Factors:Dyslipidemia. Non-ischemi cardiomyopathy. CKD.  Sonographer:    Clayton Lefort RDCS (AE) Referring Phys: [id number] Lequita Halt  Sonographer Comments: Patient is morbidly obese. IMPRESSIONS  1. Severe global reduction in LV systolic function; restrictive filling; 4 chamber enlargement; severe RV dysfunction; mild MR; moderate TR; moderate pulmonary hypertension; small pericardial effusion.  2. Left ventricular ejection fraction, by estimation, is 20 to 25%. The left ventricle has severely decreased function. The left ventricle demonstrates global hypokinesis. The left ventricular internal cavity size was moderately dilated. There is mild left ventricular hypertrophy. Left ventricular diastolic parameters are consistent with Grade III  diastolic dysfunction (restrictive).  3. Right ventricular systolic function is severely reduced. The right ventricular size is mildly enlarged. There is moderately elevated pulmonary artery systolic pressure.  4. Left atrial size was mildly dilated.  5. Right atrial size was mildly dilated.  6. The mitral valve is normal in structure. Mild mitral valve regurgitation. No evidence of mitral stenosis.  7. Tricuspid valve regurgitation is moderate.  8. The aortic valve has an indeterminant number of cusps. Aortic valve regurgitation is not visualized. No aortic stenosis is present.  9. The inferior vena cava is dilated in size with <50% respiratory variability, suggesting right atrial pressure of 15 mmHg. FINDINGS  Left Ventricle: Left ventricular ejection fraction, by estimation, is 20 to 25%. The left ventricle has severely decreased function. The left ventricle demonstrates global hypokinesis. The left ventricular internal cavity size was moderately dilated. There is mild left ventricular hypertrophy. Left ventricular diastolic parameters are consistent with Grade III diastolic dysfunction (restrictive). Right Ventricle: The right ventricular size is mildly enlarged.Right ventricular systolic function is severely reduced. There is moderately elevated pulmonary artery systolic pressure. The tricuspid regurgitant velocity is 3.30 m/s, and with an assumed right atrial pressure of 15 mmHg, the estimated right ventricular systolic pressure is 91.7 mmHg. Left Atrium: Left atrial size was mildly dilated. Right Atrium: Right atrial size was mildly dilated. Pericardium: A small pericardial effusion is  present. Mitral Valve: The mitral valve is normal in structure. Normal mobility of the mitral valve leaflets. Mild mitral valve regurgitation. No evidence of mitral valve stenosis. Tricuspid Valve: The tricuspid valve is normal in structure. Tricuspid valve regurgitation is moderate . No evidence of tricuspid stenosis. Aortic  Valve: The aortic valve has an indeterminant number of cusps. Aortic valve regurgitation is not visualized. No aortic stenosis is present. Aortic valve mean gradient measures 3.2 mmHg. Aortic valve peak gradient measures 6.6 mmHg. Aortic valve area, by VTI measures 2.71 cm. Pulmonic Valve: The pulmonic valve was not well visualized. Pulmonic valve regurgitation is trivial. No evidence of pulmonic stenosis. Aorta: The aortic root is normal in size and structure. Venous: The inferior vena cava is dilated in size with less than 50% respiratory variability, suggesting right atrial pressure of 15 mmHg. IAS/Shunts: No atrial level shunt detected by color flow Doppler. Additional Comments: Severe global reduction in LV systolic function; restrictive filling; 4 chamber enlargement; severe RV dysfunction; mild MR; moderate TR; moderate pulmonary hypertension; small pericardial effusion.  LEFT VENTRICLE PLAX 2D LVIDd:         6.16 cm LVIDs:         5.03 cm LV PW:         1.38 cm LV IVS:        1.53 cm LVOT diam:     2.30 cm LV SV:         55 LV SV Index:   22 LVOT Area:     4.15 cm  LV Volumes (MOD) LV vol d, MOD A2C: 318.0 ml LV vol d, MOD A4C: 215.0 ml LV vol s, MOD A2C: 232.0 ml LV vol s, MOD A4C: 177.0 ml LV SV MOD A2C:     86.0 ml LV SV MOD A4C:     215.0 ml LV SV MOD BP:      57.1 ml RIGHT VENTRICLE            IVC RV Basal diam:  4.02 cm    IVC diam: 3.59 cm RV Mid diam:    4.69 cm RV S prime:     6.31 cm/s TAPSE (M-mode): 0.8 cm LEFT ATRIUM             Index       RIGHT ATRIUM           Index LA diam:        5.20 cm 2.08 cm/m  RA Area:     23.90 cm LA Vol (A2C):   58.1 ml 23.20 ml/m RA Volume:   72.00 ml  28.75 ml/m LA Vol (A4C):   98.6 ml 39.37 ml/m LA Biplane Vol: 84.0 ml 33.54 ml/m  AORTIC VALVE AV Area (Vmax):    2.89 cm AV Area (Vmean):   2.79 cm AV Area (VTI):     2.71 cm AV Vmax:           128.60 cm/s AV Vmean:          85.780 cm/s AV VTI:            0.202 m AV Peak Grad:      6.6 mmHg AV Mean Grad:       3.2 mmHg LVOT Vmax:         89.40 cm/s LVOT Vmean:        57.620 cm/s LVOT VTI:          0.132 m LVOT/AV VTI ratio: 0.65  AORTA Ao Root diam: 3.30  cm Ao Asc diam:  3.60 cm TRICUSPID VALVE TR Peak grad:   43.6 mmHg TR Vmax:        330.00 cm/s  SHUNTS Systemic VTI:  0.13 m Systemic Diam: 2.30 cm Kirk Ruths MD Electronically signed by Kirk Ruths MD Signature Date/Time: 10/15/2019/4:19:33 PM    Final     Labs:  CBC: Recent Labs    10/25/19 0530 10/25/19 0531 10/26/19 0550 10/26/19 2297 10/27/19 0402 10/27/19 0530 10/28/19 0509 10/29/19 0500  WBC 5.8  --  11.1*  --   --  9.2  --  9.2  HGB 9.8*   < > 10.7*   < > 11.6* 9.9* 11.6* 9.9*  HCT 32.9*   < > 37.4*   < > 34.0* 33.8* 34.0* 33.1*  PLT 190  --  207  --   --  205  --  187   < > = values in this interval not displayed.    COAGS: Recent Labs    10/20/19 1541 10/20/19 2300 10/21/19 0227 10/22/19 0740  APTT 63* 80* 86* 74*    BMP: Recent Labs    10/27/19 1759 10/27/19 1759 10/28/19 0339 10/28/19 0509 10/28/19 1800 10/29/19 0500  NA 136   < > 138 136 133* 136  K 4.9   < > 4.9 4.9 5.2* 5.0  CL 103  --  102  --  99 102  CO2 26  --  27  --  24 27  GLUCOSE 185*  --  191*  --  208* 162*  BUN 24*  --  26*  --  29* 32*  CALCIUM 7.7*  --  7.6*  --  7.6* 7.9*  CREATININE 1.57*  --  1.51*  --  1.50* 1.59*  GFRNONAA 54*  --  56*  --  57* 53*  GFRAA >60  --  >60  --  >60 >60   < > = values in this interval not displayed.    LIVER FUNCTION TESTS: Recent Labs    05/21/19 1651 05/23/19 0339 09/29/2019 2206 10/18/19 0222 10/18/19 2200 10/19/19 1100 10/24/19 0454 10/24/19 1748 10/27/19 1759 10/28/19 0339 10/28/19 1800 10/29/19 0500  BILITOT 2.0*  --  1.3*  --  1.5*  --  1.5*  --   --   --   --   --   AST 20  --  37  --  31  --  18  --   --   --   --   --   ALT 16  --  17  --  13  --  12  --   --   --   --   --   ALKPHOS 79  --  90  --  87  --  70  --   --   --   --   --   PROT 7.3  --  7.3  --  7.0  --  6.2*   --   --   --   --   --   ALBUMIN 2.8*   < > 2.8*   < > 2.8*   < > 2.0*  2.0*   < > 2.2* 2.2* 2.4* 2.4*   < > = values in this interval not displayed.    TUMOR MARKERS: No results for input(s): AFPTM, CEA, CA199, CHROMGRNA in the last 8760 hours.  Assessment and Plan:  44 y.o, male inpatient COVID +,COVIS PNA.  Respiratory arrest intubated History of CKD ,a fib  (  on eliquis) admitted for acute on chronic CHF found to be in AKI thought to be related to cardiorenal syndrome. Team is requesting HD catheter placement. Of note patient has a LIJ temp cath placed on 4.30.21 placed by critical care team. Vas cath is functional  Pertinent Imaging 5.11.21- Chest xray shows RIJ catheter  Pertinent IR History none  Pertinent Allergies NKDA  Covid +  All labs and medications are within acceptable parameters. Risks and benefits discussed with the patient including, but not limited to bleeding, infection, vascular injury, pneumothorax which may require chest tube placement, air embolism or even death  All of the patient's questions were answered, patient is agreeable to proceed.  Consent signed and in IR control room      Thank you for this interesting consult.  I greatly enjoyed meeting Joshua Massey and look forward to participating in their care.  A copy of this report was sent to the requesting provider on this date.  Electronically Signed: Avel Peace, NP 10/29/2019, 9:50 AM   I spent a total of 40 Minutes    in face to face in clinical consultation, greater than 50% of which was counseling/coordinating care for hemodialtysis catheter placement

## 2019-10-29 NOTE — Progress Notes (Signed)
[facility] for IV Heparin Indication: atrial fibrillation  No Known Allergies  Patient Measurements: Height: 5\' 11"  (180.3 cm) Weight: (!) 146.2 kg (322 lb 5 oz) IBW/kg (Calculated) : 75.3 Heparin Dosing Weight: 106 kg  Vital Signs: Temp: 95.9 F (35.5 C) (05/11 1924) Temp Source: Esophageal (05/11 1930) BP: 154/89 (05/11 2029) Pulse Rate: 71 (05/11 1924)  Labs: Recent Labs    10/27/19 0530 10/27/19 0530 10/27/19 1759 10/28/19 0339 10/28/19 0339 10/28/19 0509 10/28/19 1800 10/29/19 0500 10/29/19 1605 10/29/19 1923  HGB 9.9*   < >  --   --   --  11.6*  --  9.9*  --   --   HCT 33.8*  --   --   --   --  34.0*  --  33.1*  --   --   PLT 205  --   --   --   --   --   --  187  --   --   HEPARINUNFRC 0.58  --   --  0.55  --   --   --  0.24*  --  0.74*  CREATININE  --   --    < > 1.51*   < >  --  1.50* 1.59* 1.47*  --    < > = values in this interval not displayed.    Estimated Creatinine Clearance: 96 mL/min (A) (by C-G formula based on SCr of 1.47 mg/dL (H)).   Assessment: 44 yr old male presented with severe fluid overload and was found to have COVID. He is on apixaban PTA for hx Afib and Pharmacy consulted for IV heparin.  Heparin level is slightly supra-therapeutic tonight.  Patient only has the central line and labs have been obtained from there.  No bleeding per RN.  Goal of Therapy:  Heparin level 0.3-0.7 units/ml aPTT 66-102 seconds Monitor platelets by anticoagulation protocol: Yes   Plan:  Reduce heparin gtt to 1800 units/hr F/U AM labs  Alixandria Friedt D. Mina Marble, PharmD, BCPS, [place] 10/29/2019, 8:48 PM

## 2019-10-29 NOTE — Progress Notes (Addendum)
[facility] for IV Heparin Indication: atrial fibrillation  No Known Allergies  Patient Measurements: Height: 5\' 11"  (180.3 cm) Weight: (!) 146.2 kg (322 lb 5 oz) IBW/kg (Calculated) : 75.3 Heparin Dosing Weight: 106 kg  Vital Signs: Temp: 98.1 F (36.7 C) (05/11 0800) Temp Source: Esophageal (05/11 0400) BP: 141/70 (05/11 0800) Pulse Rate: 71 (05/11 0800)  Labs: Recent Labs    10/27/19 0530 10/27/19 0530 10/27/19 1759 10/28/19 0339 10/28/19 0509 10/28/19 1800 10/29/19 0500  HGB 9.9*   < >  --   --  11.6*  --  9.9*  HCT 33.8*  --   --   --  34.0*  --  33.1*  PLT 205  --   --   --   --   --  187  HEPARINUNFRC 0.58  --   --  0.55  --   --  0.24*  CREATININE  --   --    < > 1.51*  --  1.50* 1.59*   < > = values in this interval not displayed.    Estimated Creatinine Clearance: 88.8 mL/min (A) (by C-G formula based on SCr of 1.59 mg/dL (H)).   Assessment: 44 yr old male presented with severe fluid overload and was found to have COVID. He is on apixaban PTA for hx a fib (last dose on 4/27@1407 ).   Heparin level low at 0.24 - some bleeding from mouth. RN unable to find source, but not concerning at this point  Cbc stable  Goal of Therapy:  Heparin level 0.3-0.7 units/ml aPTT 66-102 seconds Monitor platelets by anticoagulation protocol: Yes   Plan:  Increase heparin to 1850 units/hr  1600 HL Daily HL CBC  Barth Kirks, PharmD, BCPS, BCCCP Clinical Pharmacist [phone]  Please check AMION for all [place] numbers  10/29/2019 9:00 AM

## 2019-10-29 NOTE — Progress Notes (Signed)
Patient ID: Joshua Massey, male   DOB: August 08, 1975, 44 y.o.   MRN: [id number]   S: No events overnight- removed 3100 with CRRT - no pressors-  Fio2 req down !!   O:BP (!) 141/70   Pulse 71   Temp 98.1 F (36.7 C)   Resp (!) 30   Ht 5' 11"  (1.803 m)   Wt (!) 146.2 kg   SpO2 94%   BMI 44.95 kg/m   Intake/Output Summary (Last 24 hours) at 10/29/2019 0848 Last data filed at 10/29/2019 0818 Gross per 24 hour  Intake 2988.85 ml  Output 6119 ml  Net -3130.15 ml   Intake/Output: I/O last 3 completed shifts: In: 4237.4 [I.V.:1924.3; NG/GT:2002; IV Piggyback:311.1] Out: 8886 [Other:8886]  Intake/Output this shift:  Total I/O In: 127.8 [I.V.:77.8; NG/GT:50] Out: 243 [Other:243] Weight change: -0.765 kg Physical exam: unable to complete due to COVID + status.  In order to preserve PPE equipment and to minimize exposure to providers.  Notes from other caregivers reviewed- spoke to nurse, no concerns  vascath placed 4/30   Recent Labs  Lab 10/24/19 0454 10/24/19 0634 10/24/19 1748 10/25/19 0530 10/25/19 1630 10/25/19 1630 10/26/19 0550 10/26/19 0659 10/26/19 1830 10/27/19 0402 10/27/19 0530 10/27/19 1759 10/28/19 0339 10/28/19 0509 10/28/19 1800 10/29/19 0500  NA 138   < > 137   < > 135   < > 135   < > 135 136  --  136 138 136 133* 136  K 3.8   < > 3.9   < > 4.3   < > 4.5   < > 5.0 4.9  --  4.9 4.9 4.9 5.2* 5.0  CL 107   < > 102   < > 103  --  102  --  100  --   --  103 102  --  99 102  CO2 25   < > 26   < > 26  --  26  --  26  --   --  26 27  --  24 27  GLUCOSE 83   < > 113*   < > 152*  --  243*  --  209*  --   --  185* 191*  --  208* 162*  BUN 16   < > 16   < > 14  --  17  --  22*  --   --  24* 26*  --  29* 32*  CREATININE 1.72*   < > 1.78*   < > 1.66*  --  1.61*  --  1.69*  --   --  1.57* 1.51*  --  1.50* 1.59*  ALBUMIN 2.0*  2.0*   < > 1.7*  --  2.0*  --   --   --  2.2*  --   --  2.2* 2.2*  --  2.4* 2.4*  CALCIUM 7.4*   < > 6.8*   < > 7.2*  --  7.2*  --  7.3*  --    --  7.7* 7.6*  --  7.6* 7.9*  PHOS 1.3*   < > 6.4*   < > 2.4*   < > 2.7  --  1.9*  --  1.6* 2.6 2.0*  --  2.4* 1.9*  AST 18  --   --   --   --   --   --   --   --   --   --   --   --   --   --   --  ALT 12  --   --   --   --   --   --   --   --   --   --   --   --   --   --   --    < > = values in this interval not displayed.   Liver Function Tests: Recent Labs  Lab 10/24/19 0454 10/24/19 1748 10/28/19 0339 10/28/19 1800 10/29/19 0500  AST 18  --   --   --   --   ALT 12  --   --   --   --   ALKPHOS 70  --   --   --   --   BILITOT 1.5*  --   --   --   --   PROT 6.2*  --   --   --   --   ALBUMIN 2.0*  2.0*   < > 2.2* 2.4* 2.4*   < > = values in this interval not displayed.   No results for input(s): LIPASE, AMYLASE in the last 168 hours. No results for input(s): AMMONIA in the last 168 hours. CBC: Recent Labs  Lab 10/24/19 0454 10/24/19 0634 10/25/19 0530 10/25/19 0531 10/26/19 0550 10/26/19 0659 10/27/19 0530 10/28/19 0509 10/29/19 0500  WBC 6.8   < > 5.8   < > 11.1*  --  9.2  --  9.2  HGB 10.4*   < > 9.8*   < > 10.7*   < > 9.9* 11.6* 9.9*  HCT 33.8*   < > 32.9*   < > 37.4*   < > 33.8* 34.0* 33.1*  MCV 96.8  --  100.0  --  103.9*  --  103.0*  --  100.0  PLT 174   < > 190   < > 207  --  205  --  187   < > = values in this interval not displayed.   Cardiac Enzymes: No results for input(s): CKTOTAL, CKMB, CKMBINDEX, TROPONINI in the last 168 hours. CBG: Recent Labs  Lab 10/28/19 1525 10/28/19 2000 10/29/19 0055 10/29/19 0330 10/29/19 0715  GLUCAP 186* 216* 173* 155* 153*    Iron Studies: No results for input(s): IRON, TIBC, TRANSFERRIN, FERRITIN in the last 72 hours. Studies/Results: DG Chest Port 1 View  Result Date: 10/29/2019 CLINICAL DATA:  Acute respiratory failure. EXAM: PORTABLE CHEST 1 VIEW COMPARISON:  Chest x-ray dated Oct 27, 2019. FINDINGS: Unchanged endotracheal and enteric tubes. Unchanged bilateral internal jugular central venous catheters.  Stable cardiomediastinal silhouette. Right greater than left airspace disease has slightly improved, particularly at the right lung base. No pleural effusion or pneumothorax. No acute osseous abnormality. IMPRESSION: 1. Slightly improved multifocal pneumonia. Electronically Signed   By: Titus Dubin M.D.   On: 10/29/2019 06:45   . amiodarone  200 mg Per Tube Daily  . artificial tears  1 application Both Eyes [id number]  . atorvastatin  10 mg Per Tube Q supper  . B-complex with vitamin C  1 tablet Per Tube Daily  . chlorhexidine gluconate (MEDLINE KIT)  15 mL Mouth Rinse BID  . Chlorhexidine Gluconate Cloth  6 each Topical Daily  . docusate  100 mg Per Tube BID  . feeding supplement (PRO-STAT SUGAR FREE 64)  60 mL Per Tube TID  . insulin aspart  0-15 Units Subcutaneous Q4H  . insulin detemir  10 Units Subcutaneous BID  . mouth rinse  15 mL Mouth Rinse 10 times per day  .  methylPREDNISolone (SOLU-MEDROL) injection  40 mg Intravenous Q12H  . pantoprazole (PROTONIX) IV  40 mg Intravenous QHS  . polyethylene glycol  17 g Per Tube Daily  . sodium chloride flush  3 mL Intravenous Q12H    BMET    Component Value Date/Time   NA 136 10/29/2019 0500   NA 142 09/12/2016 0000   K 5.0 10/29/2019 0500   CL 102 10/29/2019 0500   CO2 27 10/29/2019 0500   GLUCOSE 162 (H) 10/29/2019 0500   BUN 32 (H) 10/29/2019 0500   BUN 58 (A) 09/12/2016 0000   CREATININE 1.59 (H) 10/29/2019 0500   CREATININE 2.63 (H) 12/20/2016 1613   CALCIUM 7.9 (L) 10/29/2019 0500   GFRNONAA 53 (L) 10/29/2019 0500   GFRAA >60 10/29/2019 0500   CBC    Component Value Date/Time   WBC 9.2 10/29/2019 0500   RBC 3.31 (L) 10/29/2019 0500   HGB 9.9 (L) 10/29/2019 0500   HCT 33.1 (L) 10/29/2019 0500   PLT 187 10/29/2019 0500   MCV 100.0 10/29/2019 0500   MCH 29.9 10/29/2019 0500   MCHC 29.9 (L) 10/29/2019 0500   RDW 16.9 (H) 10/29/2019 0500   LYMPHSABS 0.5 (L) 11/27/2017 1422   MONOABS 0.7 11/27/2017 1422   EOSABS 0.1  11/27/2017 1422   BASOSABS 0.0 11/27/2017 1422    Assessment/Plan: 1. Respiratory arrest/AMS- s/p intubated 5/5 and sent for head CT that was without acute findings.His hypoxia persists.  is likely related to worsening covid pneumonia.  2. Shock- likely due to covid-19 infection - UF as able 1. 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 He has had several episodes over the past year similar to this and usually due to noncompliance with his diuretics and follow up. Started on CRRT 10/18/19 with marked improvement of volume and electrolytes. 2. 4K/2.5Ca for all fluids. 3. Will changeGoal UFto 100-150 ml/hr due to higher CVP-  Giving heparin 4. Non-tunneled HD catheter placed 10/18/19-  Will need to be changed-  Maybe to tunneled ? Late this week ?  Will order thru IR to establish timing  5. Continue with CRRTdue to catabolic state, volume and just overall critical illness 3. Acute on chronic combined diastolic and systolic CHF-   Echo with EF 84-13% EF, RV systolic function severely reduced and grade III DD (restrictive).   4. Hypophosphatemia- due to CRRT. Phosphorus has been repletedIV several times with improvement but will dose again today.  5. Left charcot join with plantar ulcer- followed by Dr. Sharol Given 6. P. Atrial fib- on amio per Cardiology. 7. Anemia of CKD -started on Aranesp 40 mcg on 10/18/19. Continue to follow. 8. Medical noncompliance- ongoing issue, poor health literacy and insight. 9. Disposition- poor overall prognosis. Not certain he would tolerate intermittent HD given biventricular heart failure and low bp's. He declined starting dialysisinitiallyand is not sure he would agree to long term dialysis.   Louis Meckel  [employer] [phone]

## 2019-10-30 DIAGNOSIS — L8990 Pressure ulcer of unspecified site, unspecified stage: Secondary | ICD-10-CM | POA: Diagnosis not present

## 2019-10-30 LAB — RENAL FUNCTION PANEL
Albumin: 2.5 g/dL — ABNORMAL LOW (ref 3.5–5.0)
Albumin: 2.3 g/dL — ABNORMAL LOW (ref 3.5–5.0)
Anion gap: 6 (ref 5–15)
Anion gap: 7 (ref 5–15)
BUN: 34 mg/dL — ABNORMAL HIGH (ref 6–20)
BUN: 36 mg/dL — ABNORMAL HIGH (ref 6–20)
CO2: 25 mmol/L (ref 22–32)
CO2: 26 mmol/L (ref 22–32)
Calcium: 8 mg/dL — ABNORMAL LOW (ref 8.9–10.3)
Calcium: 7.8 mg/dL — ABNORMAL LOW (ref 8.9–10.3)
Chloride: 104 mmol/L (ref 98–111)
Chloride: 102 mmol/L (ref 98–111)
Creatinine, Ser: 1.6 mg/dL — ABNORMAL HIGH (ref 0.61–1.24)
Creatinine, Ser: 1.49 mg/dL — ABNORMAL HIGH (ref 0.61–1.24)
GFR calc Af Amer: >60 mL/min (ref >60)
GFR calc Af Amer: >60 mL/min (ref >60)
GFR calc non Af Amer: 52 mL/min — ABNORMAL LOW (ref >60)
GFR calc non Af Amer: 57 mL/min — ABNORMAL LOW (ref >60)
Glucose, Bld: 136 mg/dL — ABNORMAL HIGH (ref 70–99)
Glucose, Bld: 255 mg/dL — ABNORMAL HIGH (ref 70–99)
Phosphorus: 2.3 mg/dL — ABNORMAL LOW (ref 2.5–4.6)
Phosphorus: 2.5 mg/dL (ref 2.5–4.6)
Potassium: 5.6 mmol/L — ABNORMAL HIGH (ref 3.5–5.1)
Potassium: 5 mmol/L (ref 3.5–5.1)
Sodium: 135 mmol/L (ref 135–145)
Sodium: 135 mmol/L (ref 135–145)

## 2019-10-30 LAB — POCT I-STAT 7, (LYTES, BLD GAS, ICA,H+H)
Acid-Base Excess: 3 mmol/L — ABNORMAL HIGH (ref 0.0–2.0)
Bicarbonate: 27.9 mmol/L (ref 20.0–28.0)
Calcium, Ion: 1.19 mmol/L (ref 1.15–1.40)
HCT: 33 % — ABNORMAL LOW (ref 39.0–52.0)
Hemoglobin: 11.2 g/dL — ABNORMAL LOW (ref 13.0–17.0)
O2 Saturation: 88 %
Patient temperature: 96.7
Potassium: 5.1 mmol/L (ref 3.5–5.1)
Sodium: 137 mmol/L (ref 135–145)
TCO2: 29 mmol/L (ref 22–32)
pCO2 arterial: 42.9 mmHg (ref 32.0–48.0)
pH, Arterial: 7.417 (ref 7.350–7.450)
pO2, Arterial: 52 mmHg — ABNORMAL LOW (ref 83.0–108.0)

## 2019-10-30 LAB — GLUCOSE, CAPILLARY
Glucose-Capillary: 122 mg/dL — ABNORMAL HIGH (ref 70–99)
Glucose-Capillary: 153 mg/dL — ABNORMAL HIGH (ref 70–99)
Glucose-Capillary: 208 mg/dL — ABNORMAL HIGH (ref 70–99)
Glucose-Capillary: 209 mg/dL — ABNORMAL HIGH (ref 70–99)
Glucose-Capillary: 221 mg/dL — ABNORMAL HIGH (ref 70–99)
Glucose-Capillary: 206 mg/dL — ABNORMAL HIGH (ref 70–99)

## 2019-10-30 LAB — CBC
HCT: 33.4 % — ABNORMAL LOW (ref 39.0–52.0)
Hemoglobin: 10.1 g/dL — ABNORMAL LOW (ref 13.0–17.0)
MCH: 30.3 pg (ref 26.0–34.0)
MCHC: 30.2 g/dL (ref 30.0–36.0)
MCV: 100.3 fL — ABNORMAL HIGH (ref 80.0–100.0)
Platelets: 157 10*3/uL (ref 150–400)
RBC: 3.33 MIL/uL — ABNORMAL LOW (ref 4.22–5.81)
RDW: 17 % — ABNORMAL HIGH (ref 11.5–15.5)
WBC: 12.2 10*3/uL — ABNORMAL HIGH (ref 4.0–10.5)
nRBC: 0.2 % (ref 0.0–0.2)

## 2019-10-30 LAB — HEPARIN LEVEL (UNFRACTIONATED): Heparin Unfractionated: 0.57 IU/mL (ref 0.30–0.70)

## 2019-10-30 MED ORDER — SODIUM PHOSPHATES 45 MMOLE/15ML IV SOLN
20.0000 mmol | Freq: Once | INTRAVENOUS | Status: AC
  Administered 2019-10-30: 20 mmol via INTRAVENOUS
  Filled 2019-10-30: qty 6.67

## 2019-10-30 MED ORDER — CARVEDILOL 6.25 MG PO TABS
12.5000 mg | ORAL_TABLET | Freq: Two times a day (BID) | ORAL | Status: DC
  Administered 2019-10-30 (×2): 12.5 mg
  Filled 2019-10-30 (×2): qty 2

## 2019-10-30 MED ORDER — METHYLPREDNISOLONE SODIUM SUCC 40 MG IJ SOLR: 40.0000 mg | Freq: Every day | INTRAMUSCULAR | Status: DC

## 2019-10-30 MED ORDER — PRO-STAT SUGAR FREE PO LIQD
30.0000 mL | Freq: Every day | ORAL | Status: DC
  Administered 2019-10-30 – 2019-10-31 (×4): 30 mL
  Filled 2019-10-30 (×2): qty 30

## 2019-10-30 MED ORDER — GERHARDT'S BUTT CREAM
TOPICAL_CREAM | Freq: Two times a day (BID) | CUTANEOUS | Status: DC
  Filled 2019-10-30: qty 1

## 2019-10-30 MED ORDER — VITAL 1.5 CAL PO LIQD
1000.0000 mL | ORAL | Status: DC
  Filled 2019-10-30 (×3): qty 1000

## 2019-10-30 NOTE — Progress Notes (Signed)
NAME:  Joshua Massey, MRN:  [id number], DOB:  April 10, 1976, LOS: 25 ADMISSION DATE:  10/17/2019, CONSULTATION DATE: 10/23/2019 REFERRING MD: Dr. Waldron Labs, CHIEF COMPLAINT: Acute respiratory failure  Brief History   44 year old man.  Acute renal failure and acute respiratory failure due to acute on chronic systolic and diastolic CHF, massive overload in the setting of medical noncompliance.  COVID-19 positive, consider impact of Covid pneumonitis.  CVVH started 4/30.  Intubated 5/5 after he became acutely unresponsive, hypoxic found without oxygen in place.  Past Medical History     has a past medical history of CHF (congestive heart failure) ([place]), Chronic kidney disease, Diabetes mellitus, Exogenous obesity, GERD (gastroesophageal reflux disease), Hypertension, Left foot infection (06/21/2018), and Osteomyelitis of foot, left, acute ([place]) (07/04/2018).  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  No acute findings.  Micro Data:  SARS CoV2 4/27 >> positive Influenza A/B 4/27 >> negative MRSA screen 4/28 >> negative Blood 4/30 >>   Antimicrobials/COVID rx  Remdesivir 4/29 >> 5/3 Solumedrol 4/29-5/2, restart 5/7 >> Tocilizumab 5/6, 5/7  Interim history/subjective:  PEEP down to 12 this  morning Fentanyl 300, Versed 10 Potassium 5.19 Afebrile   Objective   Blood pressure (!) 145/75, pulse 71, temperature 99.9 F (37.7 C), resp. rate (!) 34, height 5\' 11"  (1.803 m), weight (!) 146.3 kg, SpO2 94 %. CVP:  [11 mmHg-24 mmHg] 24 mmHg  Vent Mode: PRVC FiO2 (%):  [40 %] 40 % Set Rate:  [30 bmp-34 bmp] 34 bmp Vt Set:  [450 mL] 450 mL PEEP:  [12 cmH20-14 cmH20] 12 cmH20 Plateau Pressure:  [26 cmH20] 26 cmH20   Intake/Output Summary (Last 24 hours) at 10/30/2019 1013 Last data filed at 10/30/2019 0900 Gross per 24 hour  Intake 2660.51 ml  Output 5785 ml  Net -3124.49 ml   Filed Weights   10/28/19 0337 10/29/19 0500 10/30/19 0409  Weight: (!) 147 kg (!) 146.2 kg (!) 146.3 kg    Examination: Gen:   Obese man, critically ill-appearing, no distress HEENT: ET tube in good position, no oral secretions Neck:   No upper airway noise Lungs:, Clear, distant, no wheeze or crackles CV:      Distant, regular, no murmur Abd:   Nondistended, positive bowel sounds Ext: Lower extremities wrapped bilaterally, 1+ edema.  Left plantar ulcer dressed Skin:    No rash Neuro: Opens eyes and noted questions even on high-dose sedation    Resolved Hospital Problem list     Assessment & Plan:  Acute hypoxemic respiratory failure, suspect multifactorial in the setting of bilateral pulmonary infiltrates.  Contribution of combined systolic and diastolic acute CHF as well as possible Covid pneumonitis.   Suspect his acute decompensation on 5/5 is more due to progressive COVID-19 pneumonia given aggressive volume removal Continue current PRVC 6 cc/kg He  is on high PEEP, now decreased to 12.  Suspect he will require 8-10 as he stabilizes given his obesity Continue to lighten sedation Wean steroids, goal to off over the next 2 days  Acute hypoxemic encephalopathy.  Suspect due to his acute hypoxemia.  CT head is unremarkable Wean fentanyl, midazolam more aggressively May need to add enteral  sedating medications to facilitate Lexapro on hold  Acute on chronic (stage III) renal failure Remains on CVVHD, appreciate nephrology management.  Continue with aggressive UF now net -29 L for the hospitalization Planning for tunnel catheter placement to facilitate longer-term hemodialysis  Acute on chronic systolic and diastolic CHF with severe volume overload.  Associated severe RV dysfunction and right heart failure Hypertension Carvedilol, Imdur on hold.  Plan to restart the carvedilol at half dose on 5/12  Atrial fibrillation Continue heparin infusion Continue amiodarone as ordered  Hyperglycemia Continue sliding-scale insulin as per protocol Continue Levemir 10 units twice daily  Hyperlipidemia Continue to atorvastatin as ordered  Anemia of chronic disease Follow CBC  Left plantar ulcer, Charcot foot. Sacral wound No signs of cellulitis or osteomyelitis by MRI Appreciate Dr. Jess Barters assistance, plan is to continue wound care and follow with him as an outpatient assuming no acute changes Appreciate WOC care, recommendations for sacral wound  Best practice:  Diet: NPO, Tube feeds Pain/Anxiety/Delirium protocol (if indicated): Fentanyl and Versed as infusion,  VAP protocol (if indicated): In place DVT prophylaxis: Therapeutic heparin GI prophylaxis: Protonix IV Glucose control: SSI, Levemir Mobility:BR Code Status: Full Family Communication: Updated pt's father by phone 5/12 Disposition: ICU  Critical care time:     The patient is critically ill with multiple organ system failure and requires high complexity decision making for assessment and support, frequent evaluation and titration of therpies, advanced monitoring, review of radiographic studies and interpretation of complex data.   Critical Care Time devoted to patient care services, exclusive of separately billable procedures, described in this note is 33 minutes.    Baltazar Apo, MD, PhD 10/30/2019, 10:13  [facility] [phone] or if no answer [phone]

## 2019-10-30 NOTE — Progress Notes (Signed)
Nutrition Follow-up  DOCUMENTATION CODES:   Morbid obesity  INTERVENTION:   Tube Feeding via OG tube:  Vital 1.5 at 65 ml/hr Pro-Stat 30 mL 5 times dialy Provides 2840 kcals, 180 g of protein and 1186 mL of free water  Continue B-complex with C  NUTRITION DIAGNOSIS:   Inadequate oral intake related to acute illness as evidenced by NPO status.  Being addressed via TF   GOAL:   Patient will meet greater than or equal to 90% of their needs  Progressing   MONITOR:   Vent status, Labs, Weight trends, TF tolerance, Skin  REASON FOR ASSESSMENT:   Consult Assessment of nutrition requirement/status  ASSESSMENT:   44 yo male admitted with acute respiratory failure related to bilateral pulmonary infiltrates and CHF, possible COVID pneumonits as well, acute encephalopathy,  Acute on chronic renal failure and acute on chronic CHF. PMH includes cardiomyopathy, CHF, CKD III, DM, GERD, HTN  RD working remotely.  4/27 Admitted 4/30 CRRT initiated 5/02 Abd [place] with moderate ascites 5/05 Intubated 5/06 Prone position, paralyzed 5/10 Paralytics stopped   Remains on CRRT with 3.3 L removed in 24 hours, current UF goal 100-150 ml/hr Plan for [facility] for longer term HD Pt remains on vent support, sedated with fentanyl/versed  Tolerating Vital 1.5 at 50 ml/hr with Pro-Stat 60 mL TID via OG tube  Seen by [facility] RN today for partial thickness breakdown in gluteal folds (MASD). Dr. 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 Net negative 29 L per I/O flow sheet   Labs: potassium 5.1 (wdl), CBGs 122-206 (ICU goal 140-180), Creatinine 1.60, BUN 34, phosphorus 2.3 (L) Meds: ss novolog, levemir, solumedrol  Diet Order:   Diet Order            Diet NPO time specified  Diet effective now              EDUCATION NEEDS:   Not appropriate for education at this time  Skin:  Skin Assessment: Skin Integrity  Issues: Skin Integrity Issues:: Other (Comment) Other: venous stasis ulcers b/l LE, MASD to b/l gluteal fold since by WOC RN, plantar ulcer with charcot foot seen by Dr. Sharol Given  Last BM:  5/12 rectal tube  Height:   Ht Readings from Last 1 Encounters:  10/15/19 5\' 11"  (1.803 m)    Weight:   Wt Readings from Last 1 Encounters:  10/30/19 (!) 146.3 kg    Ideal Body Weight:  (Adjusted BW 106 kg; IBW 78.2 kg)  BMI:  Body mass index is 44.99 kg/m.  Estimated Nutritional Needs:   Kcal:  (P) 2700-3100 kcals  Protein:  156-195 g  Fluid:  >/= 2 L   Kerman Passey MS, RDN, LDN, CNSC RD Pager Number and RD On-Call Pager Number Located in [place]

## 2019-10-30 NOTE — Consult Note (Signed)
WOC Nurse Consult Note: Patient with pending vascular consult for lower extremity skin breakdown to feet and legs.  Seen by Dr Sharol Given for charcot deformity in left foot.  WOC asked to consult for partial thickness breakdown to gluteal folds , Reason for Consult:moisture associated skin damage to gluteal folds.  Obesity and unable to care for self due to hypoxia and Covid 19 pneumonia Wound type:MASD Pressure Injury POA: NA Measurement: 3 cm x 1.5 cm x0.1 cm partial thickness breakdown to each gluteal fold.  Wound RDE:YCXK and moist Drainage (amount, consistency, odor) scant weeping Periwound:intact Dressing procedure/placement/frequency: Cleanse buttocks with soap and water and pat dry . Apply gerhardts butt paste twice daily and PRN soilage.  AVoid disposable briefs or underpads.  Will not follow at this time.  Please re-consult if needed.  Domenic Moras MSN, RN, FNP-BC CWON Wound, Ostomy, Continence Nurse Pager [phone]

## 2019-10-30 NOTE — Progress Notes (Addendum)
[place] for IV Heparin Indication: atrial fibrillation  No Known Allergies  Patient Measurements: Height: 5\' 11"  (180.3 cm) Weight: (!) 146.3 kg (322 lb 9.4 oz) IBW/kg (Calculated) : 75.3 Heparin Dosing Weight: 106 kg  Vital Signs: Temp: 99.9 F (37.7 C) (05/12 0930) Temp Source: Esophageal (05/12 0800) BP: 145/75 (05/12 0930) Pulse Rate: 71 (05/12 0930)  Labs: Recent Labs    10/29/19 0500 10/29/19 0500 10/29/19 1605 10/29/19 1923 10/30/19 0437 10/30/19 0607  HGB 9.9*   < >  --   --  10.1* 11.2*  HCT 33.1*  --   --   --  33.4* 33.0*  PLT 187  --   --   --  157  --   HEPARINUNFRC 0.24*  --   --  0.74* 0.57  --   CREATININE 1.59*  --  1.47*  --  1.60*  --    < > = values in this interval not displayed.    Estimated Creatinine Clearance: 88.2 mL/min (A) (by C-G formula based on SCr of 1.6 mg/dL (H)).   Assessment: 44 yr old male presented with severe fluid overload and was found to have COVID. He is on apixaban PTA for hx Afib and Pharmacy consulted for IV heparin.  Hep lvl with goal  Minimal blood tinged sputum when suctioning  Goal of Therapy:  Heparin level 0.3-0.7 units/ml aPTT 66-102 seconds Monitor platelets by anticoagulation protocol: Yes   Plan:  Continue heparin gtt to 1800 units/hr Daily hep lvl cbc  Barth Kirks, PharmD, BCPS, BCCCP Clinical Pharmacist [phone]  Please check AMION for all [place] numbers  10/30/2019 9:53 AM

## 2019-10-30 NOTE — Progress Notes (Signed)
Patient ID: Joshua Massey, male   DOB: 08-25-1975, 44 y.o.   MRN: [id number]   S: No events overnight- removed 3290 with CRRT - no pressors-  Fio2 req stable   O:BP (!) 145/75   Pulse 71   Temp 99.9 F (37.7 C)   Resp (!) 34   Ht 5' 11"  (1.803 m)   Wt (!) 146.3 kg   SpO2 94%   BMI 44.99 kg/m   Intake/Output Summary (Last 24 hours) at 10/30/2019 1152 Last data filed at 10/30/2019 1000 Gross per 24 hour  Intake 2661.6 ml  Output 5748 ml  Net -3086.4 ml   Intake/Output: I/O last 3 completed shifts: In: 4026.4 [I.V.:2064.3; NG/GT:1702; IV Piggyback:260.1] Out: 0141 [Other:9045]  Intake/Output this shift:  Total I/O In: 351.8 [I.V.:201.8; NG/GT:150] Out: 728 [Other:728] Weight change: 0.126 kg Physical exam: unable to complete due to COVID + status.  In order to preserve PPE equipment and to minimize exposure to providers.  Notes from other caregivers reviewed- spoke to nurse, no concerns  vascath placed 4/30   Recent Labs  Lab 10/24/19 0454 10/24/19 0634 10/26/19 1830 10/27/19 0402 10/27/19 0530 10/27/19 1759 10/27/19 1759 10/28/19 0339 10/28/19 0509 10/28/19 1800 10/29/19 0500 10/29/19 1605 10/30/19 0437 10/30/19 0607  NA 138   < > 135   < >  --  136   < > 138 136 133* 136 136 135 137  K 3.8   < > 5.0   < >  --  4.9   < > 4.9 4.9 5.2* 5.0 5.0 5.6* 5.1  CL 107   < > 100  --   --  103  --  102  --  99 102 103 104  --   CO2 25   < > 26  --   --  26  --  27  --  24 27 27 25   --   GLUCOSE 83   < > 209*  --   --  185*  --  191*  --  208* 162* 208* 136*  --   BUN 16   < > 22*  --   --  24*  --  26*  --  29* 32* 33* 34*  --   CREATININE 1.72*   < > 1.69*  --   --  1.57*  --  1.51*  --  1.50* 1.59* 1.47* 1.60*  --   ALBUMIN 2.0*  2.0*   < > 2.2*  --   --  2.2*  --  2.2*  --  2.4* 2.4* 2.4* 2.5*  --   CALCIUM 7.4*   < > 7.3*  --   --  7.7*  --  7.6*  --  7.6* 7.9* 8.1* 8.0*  --   PHOS 1.3*   < > 1.9*  --  1.6* 2.6  --  2.0*  --  2.4* 1.9* 2.9 2.3*  --   AST 18  --    --   --   --   --   --   --   --   --   --   --   --   --   ALT 12  --   --   --   --   --   --   --   --   --   --   --   --   --    < > = values in this interval not displayed.   Liver  Function Tests: Recent Labs  Lab 10/24/19 0454 10/24/19 1748 10/29/19 0500 10/29/19 1605 10/30/19 0437  AST 18  --   --   --   --   ALT 12  --   --   --   --   ALKPHOS 70  --   --   --   --   BILITOT 1.5*  --   --   --   --   PROT 6.2*  --   --   --   --   ALBUMIN 2.0*  2.0*   < > 2.4* 2.4* 2.5*   < > = values in this interval not displayed.   No results for input(s): LIPASE, AMYLASE in the last 168 hours. No results for input(s): AMMONIA in the last 168 hours. CBC: Recent Labs  Lab 10/25/19 0530 10/25/19 0531 10/26/19 0550 10/26/19 0659 10/27/19 0530 10/28/19 0509 10/29/19 0500 10/30/19 0437 10/30/19 0607  WBC 5.8   < > 11.1*   < > 9.2  --  9.2 12.2*  --   HGB 9.8*   < > 10.7*   < > 9.9*   < > 9.9* 10.1* 11.2*  HCT 32.9*   < > 37.4*   < > 33.8*   < > 33.1* 33.4* 33.0*  MCV 100.0  --  103.9*  --  103.0*  --  100.0 100.3*  --   PLT 190   < > 207   < > 205  --  187 157  --    < > = values in this interval not displayed.   Cardiac Enzymes: No results for input(s): CKTOTAL, CKMB, CKMBINDEX, TROPONINI in the last 168 hours. CBG: Recent Labs  Lab 10/29/19 1533 10/29/19 1941 10/29/19 2337 10/30/19 0349 10/30/19 0725  GLUCAP 200* 206* 161* 122* 153*    Iron Studies: No results for input(s): IRON, TIBC, TRANSFERRIN, FERRITIN in the last 72 hours. Studies/Results: DG Chest Port 1 View  Result Date: 10/29/2019 CLINICAL DATA:  Acute respiratory failure. EXAM: PORTABLE CHEST 1 VIEW COMPARISON:  Chest x-ray dated Oct 27, 2019. FINDINGS: Unchanged endotracheal and enteric tubes. Unchanged bilateral internal jugular central venous catheters. Stable cardiomediastinal silhouette. Right greater than left airspace disease has slightly improved, particularly at the right lung base. No pleural  effusion or pneumothorax. No acute osseous abnormality. IMPRESSION: 1. Slightly improved multifocal pneumonia. Electronically Signed   By: Titus Dubin M.D.   On: 10/29/2019 06:45   . amiodarone  200 mg Per Tube Daily  . artificial tears  1 application Both Eyes G2R  . atorvastatin  10 mg Per Tube Q supper  . B-complex with vitamin C  1 tablet Per Tube Daily  . carvedilol  12.5 mg Per Tube BID  . chlorhexidine gluconate (MEDLINE KIT)  15 mL Mouth Rinse BID  . Chlorhexidine Gluconate Cloth  6 each Topical Daily  . docusate  100 mg Per Tube BID  . feeding supplement (PRO-STAT SUGAR FREE 64)  60 mL Per Tube TID  . Gerhardt's butt cream   Topical BID  . insulin aspart  0-15 Units Subcutaneous Q4H  . insulin detemir  10 Units Subcutaneous BID  . mouth rinse  15 mL Mouth Rinse 10 times per day  . [START ON 12-Nov-2019] methylPREDNISolone (SOLU-MEDROL) injection  40 mg Intravenous Daily  . pantoprazole (PROTONIX) IV  40 mg Intravenous QHS  . polyethylene glycol  17 g Per Tube Daily  . sodium chloride flush  3 mL Intravenous Q12H  BMET    Component Value Date/Time   NA 137 10/30/2019 0607   NA 142 09/12/2016 0000   K 5.1 10/30/2019 0607   CL 104 10/30/2019 0437   CO2 25 10/30/2019 0437   GLUCOSE 136 (H) 10/30/2019 0437   BUN 34 (H) 10/30/2019 0437   BUN 58 (A) 09/12/2016 0000   CREATININE 1.60 (H) 10/30/2019 0437   CREATININE 2.63 (H) 12/20/2016 1613   CALCIUM 8.0 (L) 10/30/2019 0437   GFRNONAA 52 (L) 10/30/2019 0437   GFRAA >60 10/30/2019 0437   CBC    Component Value Date/Time   WBC 12.2 (H) 10/30/2019 0437   RBC 3.33 (L) 10/30/2019 0437   HGB 11.2 (L) 10/30/2019 0607   HCT 33.0 (L) 10/30/2019 0607   PLT 157 10/30/2019 0437   MCV 100.3 (H) 10/30/2019 0437   MCH 30.3 10/30/2019 0437   MCHC 30.2 10/30/2019 0437   RDW 17.0 (H) 10/30/2019 0437   LYMPHSABS 0.5 (L) 11/27/2017 1422   MONOABS 0.7 11/27/2017 1422   EOSABS 0.1 11/27/2017 1422   BASOSABS 0.0 11/27/2017 1422     Assessment/Plan: 1. Respiratory arrest/AMS- s/p intubated 5/5 and sent for head CT that was without acute findings.His hypoxia persists.  is likely related to worsening covid pneumonia.  2. Shock- likely due to covid-19 infection - UF as able 1. 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 He has had several episodes over the past year similar to this and usually due to noncompliance with his diuretics and follow up. Started on CRRT 10/18/19 with marked improvement of volume and electrolytes. 2. 4K/2.5Ca for all fluids. 3. Will changeGoal UFto 100-150 ml/hr due to higher CVP-  Giving heparin 4. Non-tunneled HD catheter placed 10/18/19-  Will need to be changed-  Maybe to tunneled ? Late this week ?  Will order thru IR to establish timing- appreciate their assist  5. Continue with CRRTdue to catabolic state, volume and just overall critical illness 3. Acute on chronic combined diastolic and systolic CHF-   Echo with EF 40-34% EF, RV systolic function severely reduced and grade III DD (restrictive).   4. Hypophosphatemia- due to CRRT. Phosphorus has been repletedIV several times with improvement but will dose again today.  5. Left charcot join with plantar ulcer- followed by Dr. Sharol Given 6. P. Atrial fib- on amio per Cardiology. 7. Anemia of CKD -started on Aranesp 40 mcg on 10/18/19. stable to improved 8. Medical noncompliance- ongoing issue, poor health literacy and insight. 9. Disposition- poor overall prognosis. Not certain he would tolerate intermittent HD given biventricular heart failure and low bp's. He declined starting dialysisinitiallyand is not sure he would agree to long term dialysis.   Louis Meckel  [employer] [phone]

## 2019-10-31 ENCOUNTER — Inpatient Hospital Stay (HOSPITAL_COMMUNITY): Payer: BC Managed Care – PPO

## 2019-10-31 DIAGNOSIS — I469 Cardiac arrest, cause unspecified: Secondary | ICD-10-CM

## 2019-10-31 LAB — RENAL FUNCTION PANEL
Albumin: 2.2 g/dL — ABNORMAL LOW (ref 3.5–5.0)
Anion gap: 7 (ref 5–15)
BUN: 38 mg/dL — ABNORMAL HIGH (ref 6–20)
CO2: 27 mmol/L (ref 22–32)
Calcium: 7.6 mg/dL — ABNORMAL LOW (ref 8.9–10.3)
Chloride: 102 mmol/L (ref 98–111)
Creatinine, Ser: 1.4 mg/dL — ABNORMAL HIGH (ref 0.61–1.24)
GFR calc Af Amer: >60 mL/min (ref >60)
GFR calc non Af Amer: >60 mL/min (ref >60)
Glucose, Bld: 174 mg/dL — ABNORMAL HIGH (ref 70–99)
Phosphorus: 1.7 mg/dL — ABNORMAL LOW (ref 2.5–4.6)
Potassium: 4.7 mmol/L (ref 3.5–5.1)
Sodium: 136 mmol/L (ref 135–145)

## 2019-10-31 LAB — CBC
HCT: 30.6 % — ABNORMAL LOW (ref 39.0–52.0)
Hemoglobin: 9.4 g/dL — ABNORMAL LOW (ref 13.0–17.0)
MCH: 30.5 pg (ref 26.0–34.0)
MCHC: 30.7 g/dL (ref 30.0–36.0)
MCV: 99.4 fL (ref 80.0–100.0)
Platelets: 156 10*3/uL (ref 150–400)
RBC: 3.08 MIL/uL — ABNORMAL LOW (ref 4.22–5.81)
RDW: 16.8 % — ABNORMAL HIGH (ref 11.5–15.5)
WBC: 8.5 10*3/uL (ref 4.0–10.5)
nRBC: 0 % (ref 0.0–0.2)

## 2019-10-31 LAB — POCT I-STAT 7, (LYTES, BLD GAS, ICA,H+H)
Acid-Base Excess: 3 mmol/L — ABNORMAL HIGH (ref 0.0–2.0)
Bicarbonate: 29.3 mmol/L — ABNORMAL HIGH (ref 20.0–28.0)
Calcium, Ion: 1.1 mmol/L — ABNORMAL LOW (ref 1.15–1.40)
HCT: 30 % — ABNORMAL LOW (ref 39.0–52.0)
Hemoglobin: 10.2 g/dL — ABNORMAL LOW (ref 13.0–17.0)
O2 Saturation: 62 %
Patient temperature: 97.5
Potassium: 5.4 mmol/L — ABNORMAL HIGH (ref 3.5–5.1)
Sodium: 137 mmol/L (ref 135–145)
TCO2: 31 mmol/L (ref 22–32)
pCO2 arterial: 50.8 mmHg — ABNORMAL HIGH (ref 32.0–48.0)
pH, Arterial: 7.367 (ref 7.350–7.450)
pO2, Arterial: 33 mmHg — CL (ref 83.0–108.0)

## 2019-10-31 LAB — GLUCOSE, CAPILLARY
Glucose-Capillary: 184 mg/dL — ABNORMAL HIGH (ref 70–99)
Glucose-Capillary: 158 mg/dL — ABNORMAL HIGH (ref 70–99)

## 2019-10-31 MED ORDER — SODIUM PHOSPHATES 45 MMOLE/15ML IV SOLN
30.0000 mmol | Freq: Once | INTRAVENOUS | Status: DC
  Filled 2019-10-31: qty 10

## 2019-10-31 MED ORDER — NOREPINEPHRINE 16 MG/250ML-% IV SOLN
0.0000 ug/min | INTRAVENOUS | Status: DC
  Administered 2019-10-31: 10.667 ug/min via INTRAVENOUS
  Filled 2019-10-31: qty 250

## 2019-10-31 MED ORDER — DOBUTAMINE IN D5W 4-5 MG/ML-% IV SOLN
2.5000 ug/kg/min | INTRAVENOUS | Status: DC
  Filled 2019-10-31: qty 250

## 2019-10-31 MED ORDER — INSULIN ASPART 100 UNIT/ML IV SOLN
10.0000 [IU] | Freq: Once | INTRAVENOUS | Status: AC
  Administered 2019-10-31: 10 [IU] via INTRAVENOUS

## 2019-10-31 MED ORDER — DEXTROSE 50 % IV SOLN
1.0000 | Freq: Once | INTRAVENOUS | Status: AC
  Administered 2019-10-31: 50 mL via INTRAVENOUS

## 2019-10-31 MED ORDER — CALCIUM GLUCONATE-NACL 1-0.675 GM/50ML-% IV SOLN
1.0000 g | Freq: Once | INTRAVENOUS | Status: DC
  Filled 2019-10-31: qty 50

## 2019-10-31 MED ORDER — EPINEPHRINE 1 MG/10ML IJ SOSY
PREFILLED_SYRINGE | INTRAMUSCULAR | Status: AC
  Filled 2019-10-31: qty 20

## 2019-10-31 MED ORDER — VASOPRESSIN 20 UNIT/ML IV SOLN
0.0300 [IU]/min | INTRAVENOUS | Status: DC
  Filled 2019-10-31: qty 2

## 2019-10-31 MED ORDER — EPINEPHRINE 1 MG/10ML IJ SOSY
PREFILLED_SYRINGE | INTRAMUSCULAR | Status: AC
  Filled 2019-10-31: qty 10

## 2019-10-31 MED ORDER — TENECTEPLASE 50 MG IV KIT: 50.0000 mg | PACK | Freq: Once | INTRAVENOUS | Status: DC

## 2019-10-31 MED ORDER — NOREPINEPHRINE 4 MG/250ML-% IV SOLN
INTRAVENOUS | Status: AC
  Filled 2019-10-31: qty 250

## 2019-10-31 MED ORDER — VECURONIUM BROMIDE 10 MG IV SOLR
INTRAVENOUS | Status: AC
  Filled 2019-10-31: qty 10

## 2019-10-31 MED FILL — Medication: Qty: 1 | Status: AC

## 2019-11-19 NOTE — Progress Notes (Signed)
VAST RN X2 to 28M for Code Blue. Pt has working access and IV team not needed at this time.

## 2019-11-19 NOTE — Procedures (Signed)
Extubation Procedure Note  Patient Details:   Name: Joshua Massey DOB: 1976/01/10 MRN: [id number]   Airway Documentation:    Vent end date: 11-22-2019 Vent end time: 0924   Evaluation  Pt expired at 0924 after Code for 45 Mins. Pt extubated per MD request    Jesse Sans November 22, 2019, 2:11 PM

## 2019-11-19 NOTE — Progress Notes (Signed)
[facility] Progress Note Patient Name: Joshua Massey DOB: 08/06/1975 MRN: [id number]   Date of Service  11/28/19  HPI/Events of Note  Hypotension - BP = 62/36.   eICU Interventions  Will order: 1. Do not pull fluid off on CRRT. 2. Decrease PEEP back to 8. 3. Norepinephrine IV infusion. Titrate to MAP  >= 65.      Intervention Category Major Interventions: Hypotension - evaluation and management  Lysle Dingwall 11/28/19, 6:53 AM

## 2019-11-19 NOTE — Progress Notes (Signed)
Gave ABG results to Lysbeth Galas, Therapist, sports at [employer].  Critical PaO2 of 33.

## 2019-11-19 NOTE — Progress Notes (Addendum)
[place] Progress Note Patient Name: Joshua Massey DOB: 06-03-1976 MRN: [id number]   Date of Service  Nov 18, 2019  HPI/Events of Note  Nursing reports increasing O2 requirement over the course of the evening. Now on FiO2 = 80%. CXR does not appear to have a pneumothorax to my read. Creatinine = 1.40. CXR likely represents worsening COVID pneumonia.   eICU Interventions  Will order: 1. Increase PEEP to 12 and try to wean FiO2.     Intervention Category Major Interventions: Hypoxemia - evaluation and management;Respiratory failure - evaluation and management  Shaneece Stockburger [name] 11-18-2019, 6:28 AM

## 2019-11-19 NOTE — Progress Notes (Signed)
Patient ID: Joshua Massey, male   DOB: [birth year], 44 y.o.   MRN: [id number]   S: Chart reviewed - pt hypothermic-  Had inc O2 req and hypotension overnight-  Had pulled off 2800 prior to that.  Got restarted on pressors and FIo2 up to 100 %.  UF taken down to keep even    O:BP (!) 85/53   Pulse 62   Temp (!) 97 F (36.1 C)   Resp (!) 32   Ht _0  (1.803 m)   Wt (!) 146 kg   SpO2 (!) 86%   BMI 44.89 kg/m   Intake/Output Summary (Last 24 hours) at 11-02-2019 0848 Last data filed at Nov 02, 2019 0700 Gross per 24 hour  Intake 3062.39 ml  Output 5758 ml  Net -2695.61 ml   Intake/Output: I/O last 3 completed shifts: In: 4483.6 [I.V.:2171.5; NG/GT:2055; IV Piggyback:257.1] Out: 0177 [Other:8991]  Intake/Output this shift:  No intake/output data recorded. Weight change: -0.326 kg Physical exam: unable to complete due to COVID + status.  In order to preserve PPE equipment and to minimize exposure to providers.  Notes from other caregivers reviewed- pt more unstable right now   vascath placed 4/30   Recent Labs  Lab 10/28/19 0339 10/28/19 0509 10/28/19 1800 10/28/19 1800 10/29/19 0500 10/29/19 1605 10/30/19 0437 10/30/19 0607 10/30/19 1839 Nov 02, 2019 0438 Nov 02, 2019 0615  NA 138   < > 133*   < > 136 136 135 137 135 136 137  K 4.9   < > 5.2*   < > 5.0 5.0 5.6* 5.1 5.0 4.7 5.4*  CL 102  --  99  --  102 103 104  --  102 102  --   CO2 27  --  24  --  _1 --  26 27  --   GLUCOSE 191*  --  208*  --  162* 208* 136*  --  255* 174*  --   BUN 26*  --  29*  --  32* 33* 34*  --  36* 38*  --   CREATININE 1.51*  --  1.50*  --  1.59* 1.47* 1.60*  --  1.49* 1.40*  --   ALBUMIN 2.2*  --  2.4*  --  2.4* 2.4* 2.5*  --  2.3* 2.2*  --   CALCIUM 7.6*  --  7.6*  --  7.9* 8.1* 8.0*  --  7.8* 7.6*  --   PHOS 2.0*  --  2.4*  --  1.9* 2.9 2.3*  --  2.5 1.7*  --    < > = values in this interval not displayed.   Liver Function Tests: Recent Labs  Lab 10/30/19 0437 10/30/19 1839  11/02/2019 0438  ALBUMIN 2.5* 2.3* 2.2*   No results for input(s): LIPASE, AMYLASE in the last 168 hours. No results for input(s): AMMONIA in the last 168 hours. CBC: Recent Labs  Lab 10/26/19 0550 10/26/19 0659 10/27/19 0530 10/28/19 0509 10/29/19 0500 10/29/19 0500 10/30/19 0437 10/30/19 0437 10/30/19 0607 11-02-19 0438 11-02-19 0615  WBC 11.1*   < > 9.2  --  9.2  --  12.2*  --   --  8.5  --   HGB 10.7*   < > 9.9*   < > 9.9*   < > 10.1*   < > 11.2* 9.4* 10.2*  HCT 37.4*   < > 33.8*   < > 33.1*   < > 33.4*   < > 33.0* 30.6* 30.0*  MCV 103.9*  --  103.0*  --  100.0  --  100.3*  --   --  99.4  --   PLT 207   < > 205  --  187  --  157  --   --  156  --    < > = values in this interval not displayed.   Cardiac Enzymes: No results for input(s): CKTOTAL, CKMB, CKMBINDEX, TROPONINI in the last 168 hours. CBG: Recent Labs  Lab 10/30/19 1148 10/30/19 1505 10/30/19 1944 10/30/19 2316 11/23/2019 0303  GLUCAP 208* 209* 221* 206* 184*    Iron Studies: No results for input(s): IRON, TIBC, TRANSFERRIN, FERRITIN in the last 72 hours. Studies/Results: DG Chest Port 1 View  Result Date: 23-Nov-2019 CLINICAL DATA:  Hypoxia EXAM: PORTABLE CHEST 1 VIEW COMPARISON:  Oct 29, 2019 FINDINGS: Endotracheal tube tip is 4.0 cm above the carina. Nasogastric tube tip and side port are below the diaphragm. Central catheter tip is in the left innominate vein near the junction with the superior vena cava. No pneumothorax. There is airspace consolidation in the left mid lower lung regions with small left pleural effusion. There is perihilar interstitial edema. There is cardiomegaly with pulmonary venous hypertension. No adenopathy. No bone lesions. IMPRESSION: Tube and catheter positions as described without pneumothorax. Cardiomegaly with pulmonary vascular congestion. There is perihilar interstitial pulmonary edema. Suspect a degree of congestive heart failure. There is a left pleural effusion. Consolidation  left lower lobe may represent alveolar edema but also may represent superimposed pneumonia. Electronically Signed   By: Lowella Grip III M.D.   On: 11/23/2019 07:58   . amiodarone  200 mg Per Tube Daily  . artificial tears  1 application Both Eyes Z3G  . atorvastatin  10 mg Per Tube Q supper  . B-complex with vitamin C  1 tablet Per Tube Daily  . carvedilol  12.5 mg Per Tube BID  . chlorhexidine gluconate (MEDLINE KIT)  15 mL Mouth Rinse BID  . Chlorhexidine Gluconate Cloth  6 each Topical Daily  . docusate  100 mg Per Tube BID  . EPINEPHrine      . feeding supplement (PRO-STAT SUGAR FREE 64)  30 mL Per Tube 5 X Daily  . Gerhardt's butt cream   Topical BID  . insulin aspart  0-15 Units Subcutaneous Q4H  . insulin detemir  10 Units Subcutaneous BID  . mouth rinse  15 mL Mouth Rinse 10 times per day  . methylPREDNISolone (SOLU-MEDROL) injection  40 mg Intravenous Daily  . pantoprazole (PROTONIX) IV  40 mg Intravenous QHS  . polyethylene glycol  17 g Per Tube Daily  . sodium chloride flush  3 mL Intravenous Q12H  . vecuronium        BMET    Component Value Date/Time   NA 137 2019-11-23 0615   NA 142 09/12/2016 0000   K 5.4 (H) November 23, 2019 0615   CL 102 11/23/19 0438   CO2 27 11-23-2019 0438   GLUCOSE 174 (H) 2019/11/23 0438   BUN 38 (H) 23-Nov-2019 0438   BUN 58 (A) 09/12/2016 0000   CREATININE 1.40 (H) 2019/11/23 0438   CREATININE 2.63 (H) 12/20/2016 1613   CALCIUM 7.6 (L) 11-23-19 0438   GFRNONAA >60 11-23-19 0438   GFRAA >60 2019-11-23 0438   CBC    Component Value Date/Time   WBC 8.5 11/23/2019 0438   RBC 3.08 (L) Nov 23, 2019 0438   HGB 10.2 (L) 2019-11-23 0615   HCT 30.0 (L) 11/23/2019 0615   PLT 156 2019-11-23  0438   MCV 99.4 November 27, 2019 0438   MCH 30.5 11/27/19 0438   MCHC 30.7 11/27/19 0438   RDW 16.8 (H) 11-27-19 0438   LYMPHSABS 0.5 (L) 11/27/2017 1422   MONOABS 0.7 11/27/2017 1422   EOSABS 0.1 11/27/2017 1422   BASOSABS 0.0 11/27/2017 1422     Assessment/Plan: 1. Respiratory arrest/AMS- s/p intubated 5/5 and sent for head CT that was without acute findings.His hypoxia persists.  is likely related to worsening covid pneumonia.  2. Shock- likely due to covid-19 infection -  1. 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 He has had several episodes over the past year similar to this and usually due to noncompliance with his diuretics and follow up. Started on CRRT 10/18/19 with improvement of volume and electrolytes. 2. 4K/2.5Ca for all fluids. 3. Will changeGoal UFto keeping even due to instability right now and many liters of UF over the last several weeks Giving heparin 4. Non-tunneled HD catheter placed 10/18/19-  Will need to be changed-  Maybe to tunneled ? Late this week ?  Will order thru IR to establish timing- appreciate their assist -  Now is unstable so not sure if will be possible 5. Continue with CRRTdue to catabolic state, volume and just overall critical illness 3. Acute on chronic combined diastolic and systolic CHF-   Echo with EF 50-56% EF, RV systolic function severely reduced and grade III DD (restrictive).   4. Hypophosphatemia- due to CRRT. Phosphorus has been repletedIV several times with improvement but will dose again today.  5. Left charcot join with plantar ulcer- followed by Dr. Sharol Given 6. P. Atrial fib- on amio per Cardiology. 7. Anemia of CKD -started on Aranesp 40 mcg on 10/18/19. stable to improved 8. Medical noncompliance- ongoing issue, poor health literacy and insight. 9. Disposition- poor overall prognosis. Not certain he would tolerate intermittent HD given biventricular heart failure and low bp's. He declined starting dialysisinitiallyand is not sure he would agree to long term dialysis.Seems to have taken a turn for the worse today    [place] [phone]

## 2019-11-19 NOTE — Code Documentation (Signed)
PCCM CODE BLUE documentation  Patient has had progressive hypotension overnight, UF changed to keep even.  This associated with increasing oxygen needs.  Overall hemodynamically worse after attempt to increase PEEP to 14.  Patient pulseless at [phone] and CPR immediately initiated.  Briefly achieved ROSC 0848, then pulseless again at 0906.  Never achieved adequate blood pressure or oxygen saturations.  Treated with epinephrine, up titration norepinephrine, sodium bicarbonate.  Empirically treated for possible hyperkalemia.  CVVH was discontinued and blood returned.  Given failure to stabilize pulmonary embolism considered even though he has been therapeutic on heparin infusion for atrial fibrillation.  TNK given 0919.  Unfortunately failed to achieve persistent ROSC.  CPR was discontinued at 0924, time of death 0924.  I spoke with the patient's parents, communicated the news to them.  Baltazar Apo, MD, PhD 11-22-19, 9:39 AM [facility] [phone] or if no answer [phone]

## 2019-11-19 DEATH — deceased

## 2019-11-23 DIAGNOSIS — G9341 Metabolic encephalopathy: Secondary | ICD-10-CM | POA: Diagnosis present

## 2019-11-23 DIAGNOSIS — I4891 Unspecified atrial fibrillation: Secondary | ICD-10-CM | POA: Diagnosis not present

## 2019-11-23 DIAGNOSIS — E46 Unspecified protein-calorie malnutrition: Secondary | ICD-10-CM | POA: Diagnosis present

## 2019-11-23 DIAGNOSIS — U071 COVID-19: Secondary | ICD-10-CM | POA: Diagnosis present

## 2019-11-23 DIAGNOSIS — G931 Anoxic brain damage, not elsewhere classified: Secondary | ICD-10-CM | POA: Diagnosis present

## 2019-11-23 DIAGNOSIS — D638 Anemia in other chronic diseases classified elsewhere: Secondary | ICD-10-CM | POA: Diagnosis present

## 2019-11-23 DIAGNOSIS — R739 Hyperglycemia, unspecified: Secondary | ICD-10-CM | POA: Diagnosis present

## 2019-12-19 NOTE — Death Summary Note (Signed)
DEATH SUMMARY   Patient Details  Name: Joshua Massey MRN: [id number] DOB: 16-Feb-1976  Admission/Discharge Information   Admit Date:  11/09/2019  Date of Death: Date of Death: 11-17-19  Time of Death: Time of Death: 0924  Length of Stay: September 20, 2022  Referring Physician: [name], [name], MD   Reason(s) for Hospitalization  Severe dyspnea, anasarca  Diagnoses  Preliminary cause of death:   Acute respiratory failure with hypoxemia  Secondary Diagnoses (including complications and co-morbidities):  Principal Problem:   Acute respiratory failure with hypoxia ([place]) Active Problems:   Shock ([place])   Pneumonia due to COVID-19 virus   Acute renal failure superimposed on stage 3 chronic kidney disease ([place])   Acute on chronic combined systolic and diastolic CHF (congestive heart failure) ([place])   Cardiac arrest ([place])   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 year old male with a history of chronic systolic and diastolic CHF in the setting of hypertension.  Also with chronic renal failure stage III.  He was admitted with anasarca, bilateral pulmonary infiltrates and hypoxic respiratory failure on Oct 31, 2019.  Labs revealed that he was COVID-19 positive.  He had not been experiencing any viral prodrome.  As noted he did have bilateral pulmonary infiltrates that could have also been related to COVID-19 pneumonitis.  He was found to have profound total body volume overload and diuresis was initiated.  He ultimately required initiation of CVVH on 4/30 to facilitate volume removal.  He was treated with remdesivir,  corticosteroids given his Covid positivity.  He had significant decline in mental status and respiratory status despite efforts at volume removal and required urgent intubation 10/23/2019.  It was presumed that COVID-19 was driving this decompensation.  Corticosteroids were restarted at this time and he received tocilizumab 5/6 and 5/7.  He required high PEEP and FiO2 to achieve stability, ultimately required paralysis.  Prone positioning was performed intermittently, made difficult by his exophthalmos and respiratory lability.  He developed shock overnight 10/30/2019, suspected impact of aggressive volume removal, atrial fibrillation although cause of shock was clinically undetermined.  Pressors were initiated but he failed to improve, required up titration norepinephrine, epinephrine.  He unfortunately became pulseless 2022/11/17 and CPR was initiated.  Etiology of his decompensation was not entirely clear.  Empiric TNKase was given during the code in case there was a component of thromboembolic disease.  He did not recover and died on 11-17-2022.    Pertinent Labs and Studies  Significant Diagnostic Studies DG Chest Port 1 View  Result Date: 2019-11-17 CLINICAL DATA:  Respiratory failure.  COVID-19 infection. EXAM: PORTABLE CHEST 1 VIEW COMPARISON:  Chest x-ray from same day at 0533 hours. FINDINGS: Unchanged endotracheal and enteric tubes. Unchanged bilateral internal jugular central venous catheters. Stable cardiomegaly. Progressive perihilar and right basilar opacity. Unchanged left basilar consolidation with possible small left pleural effusion. No pneumothorax. No acute osseous abnormality. IMPRESSION: 1. Progressive perihilar and right basilar airspace disease with unchanged left lower lobe consolidation, likely reflecting a combination of multifocal pneumonia and pulmonary edema. Electronically Signed   By: Titus Dubin M.D.   On: 11/17/2019 08:55  DG Chest Port 1 View  Result Date: November 23, 2019 CLINICAL  DATA:  Hypoxia EXAM: PORTABLE CHEST 1 VIEW COMPARISON:  Oct 29, 2019 FINDINGS: Endotracheal tube tip is 4.0 cm above the carina. Nasogastric tube tip and side port are below the diaphragm. Central catheter tip is in the left innominate vein near the junction with the superior vena cava. No pneumothorax. There is airspace consolidation in the left mid lower lung regions with small left pleural effusion. There is perihilar interstitial edema. There is cardiomegaly with pulmonary venous hypertension. No adenopathy. No bone lesions. IMPRESSION: Tube and catheter positions as described without pneumothorax. Cardiomegaly with pulmonary vascular congestion. There is perihilar interstitial pulmonary edema. Suspect a degree of congestive heart failure. There is a left pleural effusion. Consolidation left lower lobe may represent alveolar edema but also may represent superimposed pneumonia. Electronically Signed   By: Lowella Grip III M.D.   On: 11/23/19 07:58   DG Chest Port 1 View  Result Date: 10/29/2019 CLINICAL DATA:  Acute respiratory failure. EXAM: PORTABLE CHEST 1 VIEW COMPARISON:  Chest x-ray dated Oct 27, 2019. FINDINGS: Unchanged endotracheal and enteric tubes. Unchanged bilateral internal jugular central venous catheters. Stable cardiomediastinal silhouette. Right greater than left airspace disease has slightly improved, particularly at the right lung base. No pleural effusion or pneumothorax. No acute osseous abnormality. IMPRESSION: 1. Slightly improved multifocal pneumonia. Electronically Signed   By: Titus Dubin M.D.   On: 10/29/2019 06:45   DG Chest Port 1 View  Result Date: 10/27/2019 CLINICAL DATA:  Hypoxia.  COVID-19 positive EXAM: PORTABLE CHEST 1 VIEW COMPARISON:  Oct 26, 2019 FINDINGS: Endotracheal tube tip is 4.3 cm above the carina. Nasogastric tube tip and side port are below the diaphragm. Right jugular catheter tip is at the cavoatrial junction. Left jugular catheter tip is in the  superior vena cava slightly beyond the junction with the left innominate vein. No pneumothorax. There is patchy airspace opacity throughout much of the right lung and to a slightly lesser extent throughout much of the left lung, particular in the left mid lung and left base regions. Small left pleural effusion present. Cardiomegaly with pulmonary vascularity normal, stable. No adenopathy. No bone lesions evident. IMPRESSION: Tube and catheter positions as described without pneumothorax. Essentially stable multifocal airspace opacity with stable cardiomegaly. Electronically Signed   By: Lowella Grip III M.D.   On: 10/27/2019 08:06   DG Chest Port 1 View  Result Date: 10/26/2019 CLINICAL DATA:  Respiratory failure/hypoxia.  COVID-19 positive. EXAM: PORTABLE CHEST 1 VIEW COMPARISON:  Oct 25, 2019 FINDINGS: Endotracheal tube tip is 3.3 cm above the carina. Left jugular catheter tip is in the superior vena cava. Right jugular catheter tip is at the cavoatrial junction. Nasogastric tube tip and side port are below the diaphragm. No pneumothorax. There is airspace opacity in the right upper lobe, slightly less pronounced than 1 day prior,, with somewhat less airspace opacity in the left upper lobe compared to 1 day prior. Atelectatic change noted in the left base with small left pleural effusion, stable. No new opacity evident. There is cardiomegaly with pulmonary vascularity within normal limits. No adenopathy. No bone lesions. IMPRESSION: Stable to the catheter positions without pneumothorax. Persistent airspace opacity in the right upper lobe with slight partial clearing compared to 1 day prior. There has been partial but incomplete clearing of airspace opacity from the left upper lobe. There is stable atelectasis in the left base with small left pleural effusion. No new opacity. Stable cardiac enlargement.  Electronically Signed   By: Lowella Grip III M.D.   On: 10/26/2019 08:23   DG Chest Port 1  View  Result Date: 10/25/2019 CLINICAL DATA:  Hypoxia EXAM: PORTABLE CHEST 1 VIEW COMPARISON:  Oct 24, 2019 FINDINGS: Endotracheal tube tip is 4.3 cm above the carina. Nasogastric tube tip and side port are below the diaphragm. Central catheter tip is at the cavoatrial junction. No pneumothorax. There is airspace consolidation in both upper lobes, considerably more on the right than on the left. There is atelectatic change in the left base with small left pleural effusion. Heart is enlarged with pulmonary vascularity normal. No adenopathy. No bone lesions. IMPRESSION: Tube and catheter positions as described without pneumothorax. Airspace opacity in the upper lobes, considerably more on the right than the left, essentially stable. Stable small left pleural effusion with left base atelectasis. Stable cardiomegaly. Electronically Signed   By: Lowella Grip III M.D.   On: 10/25/2019 08:00   DG CHEST PORT 1 VIEW  Result Date: 10/24/2019 CLINICAL DATA:  PICC placement. Pulmonary infiltrates. Cardiomegaly. EXAM: PORTABLE CHEST 1 VIEW 12:24 p.m. COMPARISON:  10/24/2019 at 5:30 a.m. and 10/23/2019 FINDINGS: PICC tip is 6 cm below the carina at the cavoatrial junction. Endotracheal tube is 4.6 cm above the carina in good position. NG tube tip is below the diaphragm. Right-sided central venous catheter tip is in the upper right atrium, unchanged. Persistent unchanged interval traits on the right. Slight increased infiltrate at the left lung base. Heart size and pulmonary vascularity are unchanged. No acute bone abnormality. No pneumothorax. IMPRESSION: 1. PICC tip is 6 cm below the carina at the cavoatrial junction. 2. Persistent patchy infiltrates on the right. 3. Slight increased infiltrate at the left lung base. Electronically Signed   By: Lorriane Shire M.D.   On: 10/24/2019 12:38   Collene Gobble 11/23/2019, 10:16 AM

## 2020-07-16 IMAGING — DX DG FOOT COMPLETE 3+V*L*
3 series · 3 of 3 positions shown · non-contrast
Comparison: 08/22/2013

CLINICAL DATA: Swelling and redness

EXAM:
LEFT FOOT - COMPLETE 3+ VIEW

[foot ap]
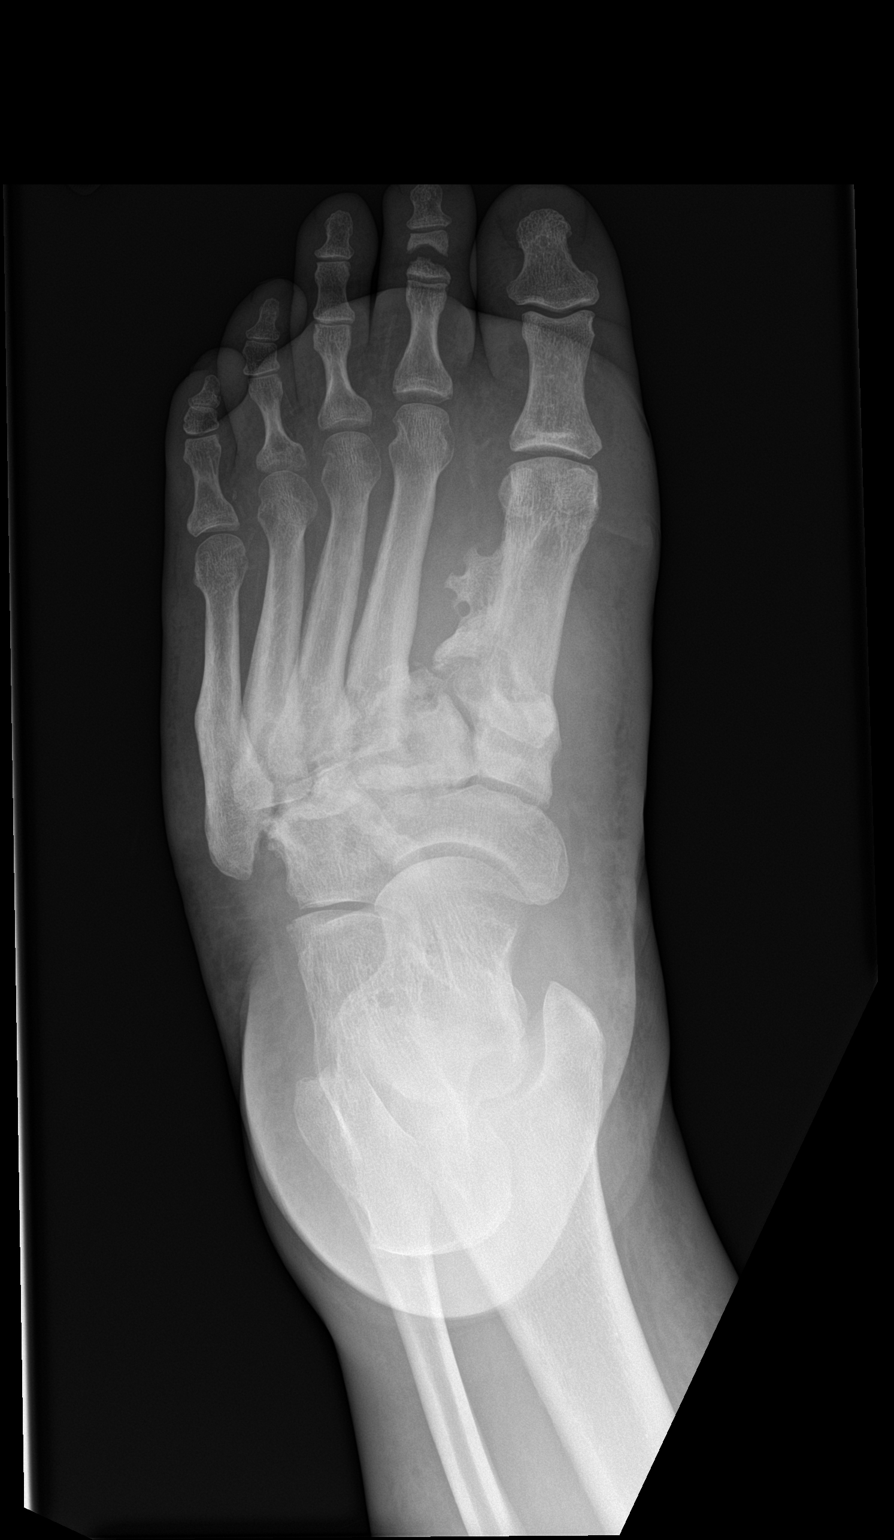

[foot obl]
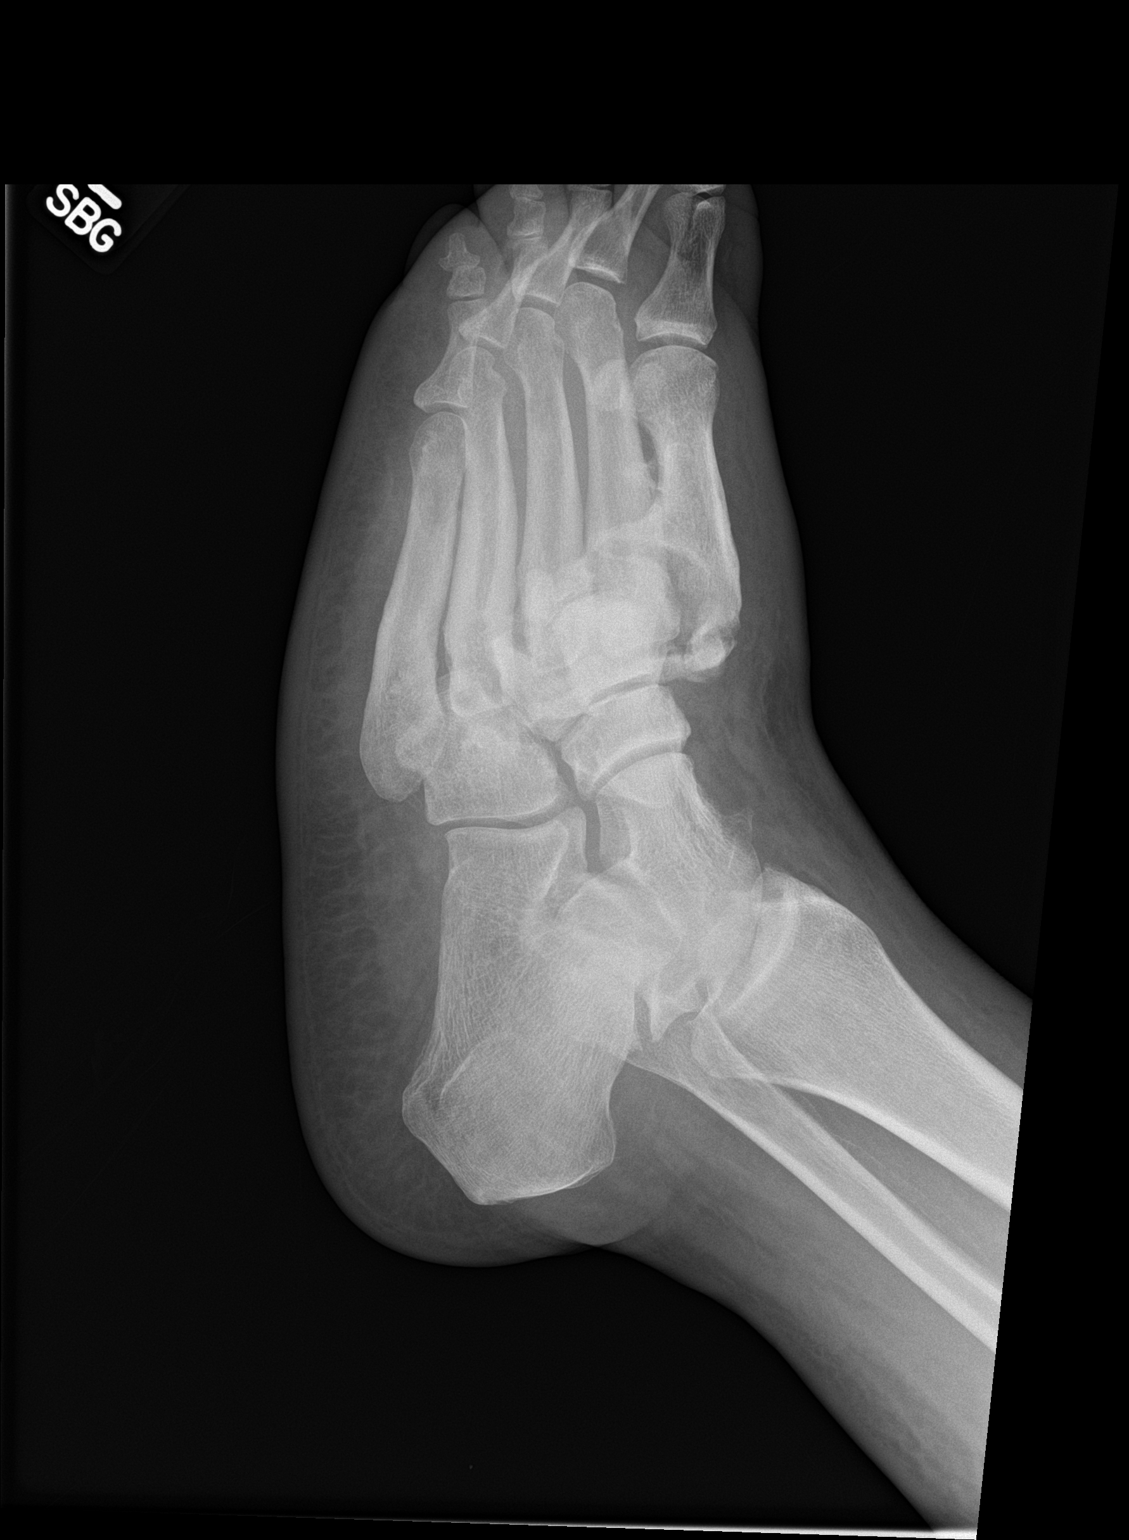

[foot lat]
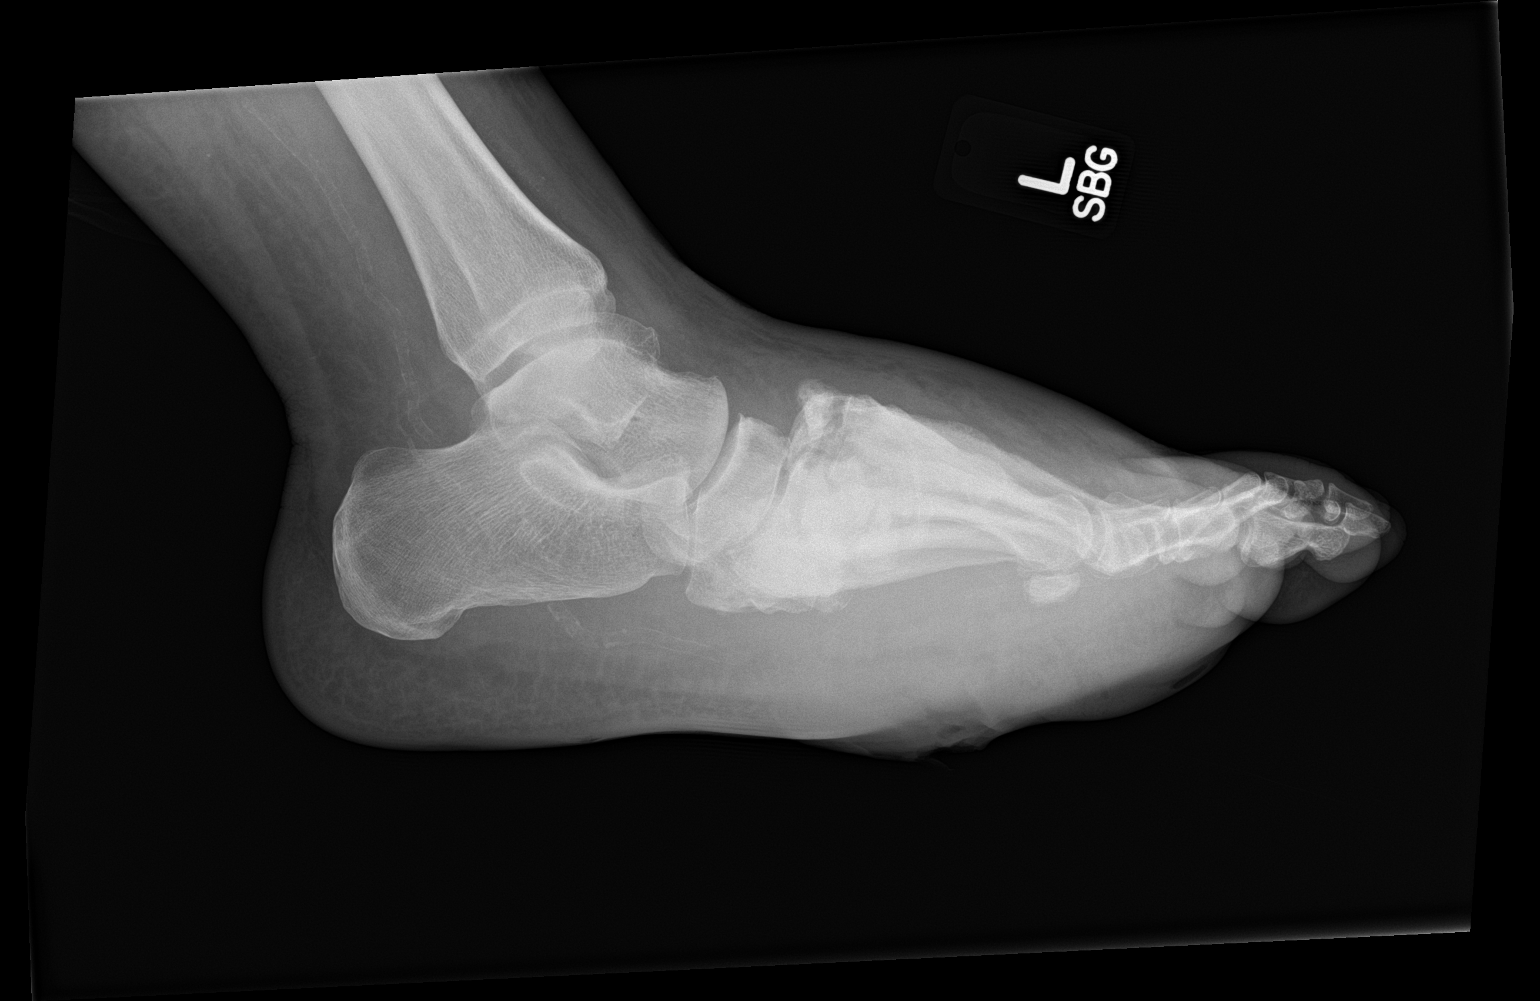

[3 of 3 positions shown; findings below may reference images not displayed]

FINDINGS: Old fracture deformity of the proximal shaft of the fifth
metatarsal. Bony destructive changes at the TMT joints with Lisfranc
deformity; there is lateral displacement of the second through fifth
metatarsal bases with respect to the tarsal bones. Bony resorptive
changes involving the base of the first metatarsal. Pes planus
deformity. Ununited fracture midportion of the second middle
phalanx. Mid and distal plantar surface ulcers without soft tissue
emphysema or radiopaque foreign body.
IMPRESSION: 1. Deformity and bony destructive changes at the level of the TMT
joints with Lisfranc deformity present. Findings could be secondary
to neuropathic joint assessment for osteomyelitis limited given the
gross deformity and bony destructive changes.
2. Ununited fracture with bone resorptive changes at the second
middle phalanx.
3. Diffuse soft tissue swelling. Irregularity and lucency at the mid
to distal plantar surface of the foot consistent with ulcers.
